# Patient Record
Sex: Female | Born: 1950 | Race: Black or African American | Hispanic: No | Marital: Married | Smoking: Never smoker
Health system: Southern US, Community
[De-identification: ages and names within clinical notes are randomized; demographics above are authoritative.]

## PROBLEM LIST (undated history)

## (undated) ENCOUNTER — Emergency Department (HOSPITAL_COMMUNITY)

## (undated) DIAGNOSIS — M1990 Unspecified osteoarthritis, unspecified site: Secondary | ICD-10-CM

## (undated) DIAGNOSIS — I4891 Unspecified atrial fibrillation: Secondary | ICD-10-CM

## (undated) DIAGNOSIS — E785 Hyperlipidemia, unspecified: Secondary | ICD-10-CM

## (undated) DIAGNOSIS — I10 Essential (primary) hypertension: Secondary | ICD-10-CM

## (undated) DIAGNOSIS — D638 Anemia in other chronic diseases classified elsewhere: Secondary | ICD-10-CM

## (undated) HISTORY — DX: Unspecified osteoarthritis, unspecified site: M19.90

## (undated) HISTORY — PX: CHOLECYSTECTOMY: SHX55

## (undated) HISTORY — DX: Hyperlipidemia, unspecified: E78.5

## (undated) HISTORY — DX: Essential (primary) hypertension: I10

## (undated) HISTORY — DX: Anemia in other chronic diseases classified elsewhere: D63.8

## (undated) HISTORY — DX: Morbid (severe) obesity due to excess calories: E66.01

## (undated) HISTORY — DX: Unspecified atrial fibrillation: I48.91

## (undated) HISTORY — PX: SHOULDER SURGERY: SHX246

## (undated) HISTORY — PX: REPLACEMENT TOTAL KNEE BILATERAL: SUR1225

## (undated) HISTORY — PX: TUBAL LIGATION: SHX77

---

## 1998-10-06 ENCOUNTER — Other Ambulatory Visit: Admission: RE | Admit: 1998-10-06 | Discharge: 1998-10-06 | Payer: Self-pay | Admitting: Family Medicine

## 2000-10-13 ENCOUNTER — Ambulatory Visit (HOSPITAL_COMMUNITY): Admission: RE | Admit: 2000-10-13 | Discharge: 2000-10-13 | Payer: Self-pay | Admitting: Family Medicine

## 2000-10-13 ENCOUNTER — Encounter: Payer: Self-pay | Admitting: Family Medicine

## 2001-01-08 ENCOUNTER — Emergency Department (HOSPITAL_COMMUNITY): Admission: EM | Admit: 2001-01-08 | Discharge: 2001-01-08 | Payer: Self-pay | Admitting: Emergency Medicine

## 2001-01-08 ENCOUNTER — Encounter: Payer: Self-pay | Admitting: Emergency Medicine

## 2003-03-03 ENCOUNTER — Ambulatory Visit (HOSPITAL_COMMUNITY): Admission: RE | Admit: 2003-03-03 | Discharge: 2003-03-03 | Payer: Self-pay | Admitting: Family Medicine

## 2004-02-24 ENCOUNTER — Ambulatory Visit (HOSPITAL_COMMUNITY): Admission: RE | Admit: 2004-02-24 | Discharge: 2004-02-24 | Payer: Self-pay | Admitting: Gastroenterology

## 2004-06-23 ENCOUNTER — Encounter (HOSPITAL_COMMUNITY): Admission: RE | Admit: 2004-06-23 | Discharge: 2004-09-21 | Payer: Self-pay | Admitting: Endocrinology

## 2004-09-20 ENCOUNTER — Ambulatory Visit: Payer: Self-pay | Admitting: Oncology

## 2004-11-07 ENCOUNTER — Ambulatory Visit: Payer: Self-pay | Admitting: Oncology

## 2004-12-07 ENCOUNTER — Encounter (INDEPENDENT_AMBULATORY_CARE_PROVIDER_SITE_OTHER): Payer: Self-pay | Admitting: *Deleted

## 2004-12-07 ENCOUNTER — Ambulatory Visit (HOSPITAL_COMMUNITY): Admission: RE | Admit: 2004-12-07 | Discharge: 2004-12-07 | Payer: Self-pay | Admitting: Gastroenterology

## 2005-01-30 ENCOUNTER — Ambulatory Visit: Payer: Self-pay | Admitting: Oncology

## 2005-04-21 ENCOUNTER — Ambulatory Visit: Payer: Self-pay | Admitting: Oncology

## 2005-06-15 ENCOUNTER — Ambulatory Visit: Payer: Self-pay | Admitting: Oncology

## 2005-08-11 ENCOUNTER — Ambulatory Visit: Payer: Self-pay | Admitting: Oncology

## 2005-09-22 ENCOUNTER — Ambulatory Visit: Payer: Self-pay | Admitting: Oncology

## 2005-09-26 LAB — CBC WITH DIFFERENTIAL/PLATELET
BASO%: 0.7 % (ref 0.0–2.0)
Basophils Absolute: 0 10*3/uL (ref 0.0–0.1)
HCT: 38.6 % (ref 34.8–46.6)
HGB: 12.5 g/dL (ref 11.6–15.9)
LYMPH%: 36.9 % (ref 14.0–48.0)
MCH: 27.6 pg (ref 26.0–34.0)
MCHC: 32.3 g/dL (ref 32.0–36.0)
MONO#: 0.4 10*3/uL (ref 0.1–0.9)
NEUT%: 53.4 % (ref 39.6–76.8)
Platelets: 307 10*3/uL (ref 145–400)
WBC: 5.7 10*3/uL (ref 3.9–10.0)
lymph#: 2.1 10*3/uL (ref 0.9–3.3)

## 2005-10-16 LAB — BASIC METABOLIC PANEL
CO2: 24 mEq/L (ref 19–32)
Calcium: 9.4 mg/dL (ref 8.4–10.5)
Glucose, Bld: 85 mg/dL (ref 70–99)
Potassium: 4.4 mEq/L (ref 3.5–5.3)
Sodium: 142 mEq/L (ref 135–145)

## 2005-10-16 LAB — CBC WITH DIFFERENTIAL (CANCER CENTER ONLY)
BASO#: 0 10*3/uL (ref 0.0–0.2)
Eosinophils Absolute: 0.1 10*3/uL (ref 0.0–0.5)
HCT: 39 % (ref 34.8–46.6)
LYMPH%: 33.4 % (ref 14.0–48.0)
MCV: 83 fL (ref 81–101)
MONO#: 0.3 10*3/uL (ref 0.1–0.9)
NEUT%: 59.1 % (ref 39.6–80.0)
RBC: 4.72 10*6/uL (ref 3.70–5.32)
WBC: 5.9 10*3/uL (ref 3.9–10.0)

## 2005-11-10 ENCOUNTER — Ambulatory Visit: Payer: Self-pay | Admitting: Oncology

## 2005-11-13 LAB — CBC WITH DIFFERENTIAL (CANCER CENTER ONLY)
BASO#: 0 10*3/uL (ref 0.0–0.2)
Eosinophils Absolute: 0.1 10*3/uL (ref 0.0–0.5)
HGB: 11.6 g/dL (ref 11.6–15.9)
MCV: 85 fL (ref 81–101)
MONO#: 0.3 10*3/uL (ref 0.1–0.9)
NEUT#: 4.7 10*3/uL (ref 1.5–6.5)
Platelets: 263 10*3/uL (ref 145–400)
RBC: 4.26 10*6/uL (ref 3.70–5.32)
WBC: 6.8 10*3/uL (ref 3.9–10.0)

## 2005-12-11 LAB — CBC WITH DIFFERENTIAL (CANCER CENTER ONLY)
BASO#: 0 10*3/uL (ref 0.0–0.2)
BASO%: 0.4 % (ref 0.0–2.0)
EOS%: 2.7 % (ref 0.0–7.0)
HGB: 12.6 g/dL (ref 11.6–15.9)
LYMPH#: 2.2 10*3/uL (ref 0.9–3.3)
MCH: 26.4 pg (ref 26.0–34.0)
MCHC: 32.5 g/dL (ref 32.0–36.0)
MONO%: 3.7 % (ref 0.0–13.0)
NEUT#: 3.3 10*3/uL (ref 1.5–6.5)
Platelets: 323 10*3/uL (ref 145–400)
RDW: 15.4 % — ABNORMAL HIGH (ref 10.5–14.6)

## 2005-12-11 LAB — BASIC METABOLIC PANEL
BUN: 40 mg/dL — ABNORMAL HIGH (ref 6–23)
CO2: 22 mEq/L (ref 19–32)
Chloride: 109 mEq/L (ref 96–112)
Creatinine, Ser: 1.66 mg/dL — ABNORMAL HIGH (ref 0.40–1.20)
Glucose, Bld: 164 mg/dL — ABNORMAL HIGH (ref 70–99)

## 2005-12-11 LAB — FERRITIN: Ferritin: 228 ng/mL (ref 10–291)

## 2006-01-04 ENCOUNTER — Ambulatory Visit: Payer: Self-pay | Admitting: Oncology

## 2006-01-09 LAB — CBC WITH DIFFERENTIAL (CANCER CENTER ONLY)
EOS%: 3.4 % (ref 0.0–7.0)
Eosinophils Absolute: 0.3 10*3/uL (ref 0.0–0.5)
LYMPH#: 2.3 10*3/uL (ref 0.9–3.3)
MCH: 26.3 pg (ref 26.0–34.0)
MONO%: 4.7 % (ref 0.0–13.0)
NEUT#: 4.8 10*3/uL (ref 1.5–6.5)
Platelets: 270 10*3/uL (ref 145–400)
RBC: 4.63 10*6/uL (ref 3.70–5.32)

## 2006-02-05 LAB — CBC WITH DIFFERENTIAL (CANCER CENTER ONLY)
BASO#: 0.1 10*3/uL (ref 0.0–0.2)
BASO%: 1.1 % (ref 0.0–2.0)
EOS%: 2.8 % (ref 0.0–7.0)
Eosinophils Absolute: 0.2 10*3/uL (ref 0.0–0.5)
HCT: 34.2 % — ABNORMAL LOW (ref 34.8–46.6)
HGB: 11.1 g/dL — ABNORMAL LOW (ref 11.6–15.9)
LYMPH#: 2.5 10*3/uL (ref 0.9–3.3)
LYMPH%: 33.9 % (ref 14.0–48.0)
MCH: 26.9 pg (ref 26.0–34.0)
MCHC: 32.6 g/dL (ref 32.0–36.0)
MCV: 83 fL (ref 81–101)
MONO#: 0.3 10*3/uL (ref 0.1–0.9)
MONO%: 4.4 % (ref 0.0–13.0)
NEUT#: 4.3 10*3/uL (ref 1.5–6.5)
NEUT%: 57.8 % (ref 39.6–80.0)
Platelets: 242 10*3/uL (ref 145–400)
RBC: 4.14 10*6/uL (ref 3.70–5.32)
RDW: 15.6 % — ABNORMAL HIGH (ref 10.5–14.6)
WBC: 7.4 10*3/uL (ref 3.9–10.0)

## 2006-03-01 ENCOUNTER — Ambulatory Visit: Payer: Self-pay | Admitting: Oncology

## 2006-04-02 ENCOUNTER — Encounter: Admission: RE | Admit: 2006-04-02 | Discharge: 2006-04-02 | Payer: Self-pay | Admitting: Endocrinology

## 2006-10-17 ENCOUNTER — Inpatient Hospital Stay (HOSPITAL_COMMUNITY): Admission: EM | Admit: 2006-10-17 | Discharge: 2006-10-22 | Payer: Self-pay | Admitting: Emergency Medicine

## 2007-03-13 ENCOUNTER — Encounter: Admission: RE | Admit: 2007-03-13 | Discharge: 2007-03-13 | Payer: Self-pay | Admitting: Endocrinology

## 2008-02-03 ENCOUNTER — Encounter: Admission: RE | Admit: 2008-02-03 | Discharge: 2008-02-03 | Payer: Self-pay | Admitting: Endocrinology

## 2009-02-11 ENCOUNTER — Ambulatory Visit: Payer: Self-pay | Admitting: Surgery

## 2009-03-11 ENCOUNTER — Encounter: Admission: RE | Admit: 2009-03-11 | Discharge: 2009-03-11 | Payer: Self-pay | Admitting: Internal Medicine

## 2009-12-16 ENCOUNTER — Ambulatory Visit: Payer: Self-pay | Admitting: Cardiology

## 2009-12-30 ENCOUNTER — Ambulatory Visit: Payer: Self-pay | Admitting: Cardiovascular Disease

## 2010-01-27 ENCOUNTER — Ambulatory Visit: Payer: Self-pay | Admitting: Cardiology

## 2010-02-01 ENCOUNTER — Ambulatory Visit: Payer: Self-pay | Admitting: Cardiovascular Disease

## 2010-02-08 ENCOUNTER — Ambulatory Visit: Payer: Self-pay | Admitting: Cardiovascular Disease

## 2010-02-14 ENCOUNTER — Ambulatory Visit: Payer: Self-pay | Admitting: Cardiovascular Disease

## 2010-03-08 ENCOUNTER — Ambulatory Visit: Payer: Self-pay | Admitting: Cardiology

## 2010-04-05 ENCOUNTER — Ambulatory Visit: Payer: Self-pay | Admitting: Cardiology

## 2010-04-13 ENCOUNTER — Ambulatory Visit: Payer: Self-pay | Admitting: Cardiology

## 2010-05-18 ENCOUNTER — Ambulatory Visit: Payer: Self-pay | Admitting: Cardiovascular Disease

## 2010-05-23 LAB — BASIC METABOLIC PANEL
BUN: 21 mg/dL (ref 6–23)
CO2: 30 mEq/L (ref 19–32)
Calcium: 9.8 mg/dL (ref 8.4–10.5)
Chloride: 101 mEq/L (ref 96–112)
Creatinine, Ser: 1.16 mg/dL (ref 0.4–1.2)
GFR calc Af Amer: 58 mL/min — ABNORMAL LOW (ref >60)
GFR calc non Af Amer: 48 mL/min — ABNORMAL LOW (ref >60)
Glucose, Bld: 100 mg/dL — ABNORMAL HIGH (ref 70–99)
Potassium: 3.8 mEq/L (ref 3.5–5.1)
Sodium: 139 mEq/L (ref 135–145)

## 2010-05-23 LAB — CBC
HCT: 33.5 % — ABNORMAL LOW (ref 36.0–46.0)
Hemoglobin: 10.9 g/dL — ABNORMAL LOW (ref 12.0–15.0)
MCH: 28 pg (ref 26.0–34.0)
MCHC: 32.5 g/dL (ref 30.0–36.0)
MCV: 86.1 fL (ref 78.0–100.0)
Platelets: 279 10*3/uL (ref 150–400)
RBC: 3.89 MIL/uL (ref 3.87–5.11)
RDW: 15.3 % (ref 11.5–15.5)
WBC: 6.8 10*3/uL (ref 4.0–10.5)

## 2010-05-23 LAB — APTT: aPTT: 37 seconds (ref 24–37)

## 2010-05-23 LAB — PROTIME-INR
INR: 1.65 — ABNORMAL HIGH (ref 0.00–1.49)
Prothrombin Time: 19.7 seconds — ABNORMAL HIGH (ref 11.6–15.2)

## 2010-05-25 ENCOUNTER — Ambulatory Visit (HOSPITAL_COMMUNITY)
Admission: RE | Admit: 2010-05-25 | Discharge: 2010-05-25 | Payer: Self-pay | Source: Home / Self Care | Attending: Obstetrics | Admitting: Obstetrics

## 2010-05-30 LAB — GLUCOSE, CAPILLARY
Glucose-Capillary: 79 mg/dL (ref 70–99)
Glucose-Capillary: 65 mg/dL — ABNORMAL LOW (ref 70–99)
Glucose-Capillary: 90 mg/dL (ref 70–99)

## 2010-06-06 ENCOUNTER — Ambulatory Visit: Payer: Self-pay | Admitting: Cardiovascular Disease

## 2010-06-06 ENCOUNTER — Emergency Department (HOSPITAL_COMMUNITY)
Admission: EM | Admit: 2010-06-06 | Discharge: 2010-06-06 | Payer: Self-pay | Source: Home / Self Care | Admitting: Family Medicine

## 2010-06-13 NOTE — Op Note (Signed)
NAMETEREZ, MONTEE                 ACCOUNT NO.:  [id number]  MEDICAL RECORD NO.:  [id number]          PATIENT TYPE:  AMB  LOCATION:  SDC                           FACILITY:  [facility]  PHYSICIAN:  Lendon Colonel, MD   DATE OF BIRTH:  1950-12-18  DATE OF PROCEDURE:  05/25/2010 DATE OF DISCHARGE:                              OPERATIVE REPORT   PREOPERATIVE DIAGNOSES: 1. Postmenopausal bleeding. 2. Cervical mass. 3. Probable fibroid.  POSTOPERATIVE DIAGNOSES: 1. Postmenopausal bleeding. 2. Cervical mass. 3. Probable fibroid.  PROCEDURES:  Endometrial biopsy, cervical myomectomy.  SURGEON:  Alphonsus Sias. Ernestina Penna, MD  ASSISTANT:  None.  ANESTHESIA:  Local with MAC.  FINDINGS:  A 6-cm uterine sound 3 x 2 cm soft mass on the posterior lip of the cervix.  SPECIMENS:  Endometrial biopsy, cervical mass to Pathology.  ANTIBIOTICS:  None.  ESTIMATED BLOOD LOSS:  Minimal.  COMPLICATIONS:  None.  INDICATIONS:  This is a 60 year old postmenopausal woman who had no GYN care in 20 years until she began monthly well time bleeding since August of 2011.  The patient noted bleeding that lasted a few days each month without pain.  Of note, patient is on Coumadin for atrial fibrillation. The patient was referred and had a normal Pap smear, a normal pelvic ultrasound with a 6-cm, 35 mL uterus with a 2.6-mm endometrial stripe and a 3 x 2 cm mass on the posterior lip of the cervix that appeared most consistent with a cervical fibroid given the thin endometrial stripe.  No preoperative endometrial biopsy was done. Given the normal Pap smear no preprocedure colposcopy was done as the mass appeared benign in nature.  PROCEDURE IN DETAIL:  After informed consent was obtained, the patient was taken to the operating room where MAC anesthesia was initiated without difficulty.  She was prepped and draped in normal sterile fashion in dorsal supine lithotomy position.  A straight cath was done for small  amount of concentrated urine.  Bimanual examination was done to assess the size and position of the uterus.  A speculum  was placed into the vagina and a single-tooth tenaculum used to grasp the anterior lip of the cervix.  A sound was easily passed into the uterus to 6 cm.  Endometrial biopsy was performed and sent to Pathology. Tenaculum and Jacobson tenaculum were placed on the cervical mass.  An attempt was made to dissect the cervical mass away from the surrounding cervical tissue.  It was quite stuck and on exam today it appeared much softer with some cystic appearance as opposed to the firm fibroid- appearing tissue in the office.  A 10 mL of the solution of 20 mL of vasopressin, 100 mL normal saline was injected at the base of the mass. Additional 10 mL of 1% lidocaine was injected at the base of the mass as well.  A large loop was used to remove the cervical mass.  A second deep pass was taken.  All of the mass was removed.  Ball cautery was used to provide hemostasis at the large base of the posterior cervical incision and Monsel's was placed.  Hemostasis was assurred. Vaginal epithelium was intact and the procedure was terminated.  Patient tolerated this procedure well. Sponge, lap, and needle count were correct x3 and patient was taken to recovery room in stable condition.     Lendon Colonel, MD     KAF/MEDQ  D:  05/25/2010  T:  05/25/2010  Job:  [id number]  Electronically Signed by Noland Fordyce MD on 06/13/2010 09:47:27 PM

## 2010-06-16 ENCOUNTER — Other Ambulatory Visit (INDEPENDENT_AMBULATORY_CARE_PROVIDER_SITE_OTHER): Payer: Medicare Other

## 2010-06-16 DIAGNOSIS — Z7901 Long term (current) use of anticoagulants: Secondary | ICD-10-CM

## 2010-06-16 DIAGNOSIS — I4891 Unspecified atrial fibrillation: Secondary | ICD-10-CM

## 2010-06-30 ENCOUNTER — Encounter (INDEPENDENT_AMBULATORY_CARE_PROVIDER_SITE_OTHER): Payer: Medicare Other

## 2010-06-30 DIAGNOSIS — I4891 Unspecified atrial fibrillation: Secondary | ICD-10-CM

## 2010-06-30 DIAGNOSIS — Z7901 Long term (current) use of anticoagulants: Secondary | ICD-10-CM

## 2010-07-18 ENCOUNTER — Other Ambulatory Visit (INDEPENDENT_AMBULATORY_CARE_PROVIDER_SITE_OTHER): Payer: Medicare Other

## 2010-07-18 DIAGNOSIS — Z7901 Long term (current) use of anticoagulants: Secondary | ICD-10-CM

## 2010-07-18 DIAGNOSIS — I4891 Unspecified atrial fibrillation: Secondary | ICD-10-CM

## 2010-07-27 ENCOUNTER — Ambulatory Visit (INDEPENDENT_AMBULATORY_CARE_PROVIDER_SITE_OTHER): Payer: Medicare Other | Admitting: *Deleted

## 2010-07-27 DIAGNOSIS — Z7901 Long term (current) use of anticoagulants: Secondary | ICD-10-CM

## 2010-07-27 LAB — POCT INR: INR: 2.7

## 2010-08-04 ENCOUNTER — Ambulatory Visit (INDEPENDENT_AMBULATORY_CARE_PROVIDER_SITE_OTHER): Payer: Medicare Other | Admitting: *Deleted

## 2010-08-04 DIAGNOSIS — I4891 Unspecified atrial fibrillation: Secondary | ICD-10-CM

## 2010-08-04 DIAGNOSIS — Z7901 Long term (current) use of anticoagulants: Secondary | ICD-10-CM

## 2010-08-04 LAB — POCT INR: INR: 3.4

## 2010-08-18 ENCOUNTER — Ambulatory Visit (INDEPENDENT_AMBULATORY_CARE_PROVIDER_SITE_OTHER): Payer: Medicare Other | Admitting: *Deleted

## 2010-08-18 DIAGNOSIS — Z7901 Long term (current) use of anticoagulants: Secondary | ICD-10-CM

## 2010-08-18 DIAGNOSIS — I4891 Unspecified atrial fibrillation: Secondary | ICD-10-CM

## 2010-08-25 ENCOUNTER — Encounter: Payer: Self-pay | Admitting: Cardiovascular Disease

## 2010-08-25 DIAGNOSIS — IMO0001 Reserved for inherently not codable concepts without codable children: Secondary | ICD-10-CM | POA: Insufficient documentation

## 2010-08-25 DIAGNOSIS — I10 Essential (primary) hypertension: Secondary | ICD-10-CM | POA: Insufficient documentation

## 2010-08-25 DIAGNOSIS — E1129 Type 2 diabetes mellitus with other diabetic kidney complication: Secondary | ICD-10-CM | POA: Insufficient documentation

## 2010-08-25 DIAGNOSIS — E785 Hyperlipidemia, unspecified: Secondary | ICD-10-CM | POA: Insufficient documentation

## 2010-08-25 DIAGNOSIS — M1990 Unspecified osteoarthritis, unspecified site: Secondary | ICD-10-CM | POA: Insufficient documentation

## 2010-08-29 ENCOUNTER — Ambulatory Visit (INDEPENDENT_AMBULATORY_CARE_PROVIDER_SITE_OTHER): Payer: Medicare Other | Admitting: Cardiovascular Disease

## 2010-08-29 ENCOUNTER — Encounter: Payer: Self-pay | Admitting: Cardiovascular Disease

## 2010-08-29 VITALS — BP 136/76 | HR 84 | Ht 62.0 in | Wt 246.6 lb

## 2010-08-29 DIAGNOSIS — I4891 Unspecified atrial fibrillation: Secondary | ICD-10-CM

## 2010-08-29 NOTE — Assessment & Plan Note (Signed)
Megan Fischer remains in normal sinus rhythm. I've encouraged her to continue with her same medications. Her heart levels have been therapeutic. We will see her again in one year for office visit and EKG.

## 2010-08-29 NOTE — Progress Notes (Signed)
Megan Fischer Date of Birth  [birth year] [facility] [address]     [address] [place], [place]  [address] [phone]  Fax  [phone]  History of Present Illness:  Megan Fischer is a middle-aged female with a history of obesity, intermittent atrial fibrillation, hypertension, dyslipidemia, and diabetes. She presents today for followup visit. She does not have any complaints.  Current Outpatient Prescriptions on File Prior to Visit  Medication Sig Dispense Refill  . BENAZEPRIL HCL PO Take by mouth daily.        Marland Kitchen CALCIUM PO Take by mouth 2 (two) times daily.        Marland Kitchen diltiazem (CARDIZEM CD) 240 MG 24 hr capsule Take 240 mg by mouth daily.        . ergocalciferol (VITAMIN D2) 50000 UNITS capsule Take 50,000 Units by mouth once a week.        Marland Kitchen exenatide (BYETTA 10 MCG PEN) 10 MCG/0.04ML SOLN Inject into the skin 2 (two) times daily with a meal.        . glipiZIDE (GLUCOTROL) 5 MG tablet Take 5 mg by mouth daily.        . metFORMIN (GLUCOPHAGE) 500 MG tablet Take 500 mg by mouth 3 (three) times daily.        . metoprolol (TOPROL-XL) 50 MG 24 hr tablet Take 50 mg by mouth daily.        . rosuvastatin (CRESTOR) 10 MG tablet Take 10 mg by mouth daily.        . Tamsulosin HCl (FLOMAX) 0.4 MG CAPS Take by mouth daily.        Marland Kitchen warfarin (COUMADIN) 5 MG tablet Take 5 mg by mouth daily. AS DIRECTED          Allergies  Allergen Reactions  . Codeine   . Oxycontin   . Penicillins     Past Medical History  Diagnosis Date  . Obesities, morbid   . Atrial fibrillation   . Hypertension   . Dyslipidemia   . Diabetes mellitus   . Osteoarthritis     Past Surgical History  Procedure Date  . Cholecystectomy   . Tubal ligation   . Replacement total knee bilateral   . Shoulder surgery     RIGHT SHOULDER    History  Smoking status  . Never Smoker   Smokeless tobacco  . Not on file    History  Alcohol Use No    Family History  Problem  Relation Age of Onset  . Heart attack Mother   . Stroke Mother   . Diabetes Mother   . Hypertension Brother     Reviw of Systems:  Reviewed in the HPI.  All other systems are negative.  Physical Exam: BP 136/76  Pulse 84  Ht 5\' 2"  (1.575 m)  Wt 246 lb 9.6 oz (111.857 kg)  BMI 45.10 kg/m2 The patient is alert and oriented x 3.  The mood and affect are normal.  The skin is warm and dry.  Color is normal.  The HEENT exam reveals that the sclera are nonicteric.  The mucous membranes are moist.  The carotids are 2+ without bruits.  There is no thyromegaly.  There is no JVD.  The lungs are clear.  The chest wall is non tender.  The heart exam reveals a regular rate with a normal S1 and S2.  She has frequent premature beats.  There are no murmurs, gallops, or rubs.  The PMI is not displaced.   Abdominal exam reveals good bowel sounds.  There is no guarding or rebound.  There is no hepatosplenomegaly or tenderness.  There are no masses.  Exam of the legs reveal no clubbing, cyanosis, or edema.  The legs are without rashes.  The distal pulses are intact.  Cranial nerves II - XII are intact.  Motor and sensory functions are intact.  The gait is normal.  ECG: Normal sinus rhythm with frequent premature atrial contractions. She has nonspecific T-wave abnormality. Assessment / Plan:

## 2010-09-09 ENCOUNTER — Ambulatory Visit (INDEPENDENT_AMBULATORY_CARE_PROVIDER_SITE_OTHER): Payer: Medicare Other | Admitting: *Deleted

## 2010-09-09 DIAGNOSIS — I4891 Unspecified atrial fibrillation: Secondary | ICD-10-CM

## 2010-09-09 DIAGNOSIS — Z7901 Long term (current) use of anticoagulants: Secondary | ICD-10-CM

## 2010-09-09 LAB — POCT INR: INR: 3

## 2010-09-20 NOTE — Discharge Summary (Signed)
Megan Fischer, Megan Fischer                 ACCOUNT NO.:  [id number]   MEDICAL RECORD NO.:  [id number]          PATIENT TYPE:  INP   LOCATION:  1304                         FACILITY:  [facility]   PHYSICIAN:  Feliberto Gottron. Turner Daniels, M.D.   DATE OF BIRTH:  May 30, 1950   DATE OF ADMISSION:  10/17/2006  DATE OF DISCHARGE:  10/22/2006                               DISCHARGE SUMMARY   PRIMARY DIAGNOSIS:  Bimalleolar fracture with interruption of ankle  mortis joint  extensive to intramedullary rod and surrounding cement  from previous oncological fracture.   HISTORY OF PRESENT ILLNESS:  The patient is a 60 year old, white female  who fell on October 17, 2006, and suffered a bimalleolar fracture, vertex  square with a worsening left total knee humeral prosthetic stem that  fracture with subsequent surgery.  The patient had Giant Cell tumor of L  [place] followed by Dr. Romeo Apple  surgery referred for further  consultation.   SOCIAL HISTORY:  She lives with husband .  She is disabled.  She does  not drink alcohol.   ALLERGIES:  SHE IS ALLERGIC TO CODEINE AND OXYCODONE.   CURRENT MEDICATIONS:  The patient reported Crestor, Glipizide, insulin  and Toprol.   PAST MEDICAL HISTORY:  Significant for tumor of the left leg treated by  Dr. Romeo Apple , hypertension, diabetes and pulmonary embolus in 1970.   PHYSICAL EXAMINATION:  VITAL SIGNS:  Pulse 88, temperature 98.5,  respirations 16 , blood pressure 150/90.  GENERAL:  wnwd  female.  HEENT:  Head is atraumatic, normocephalic.  NECK:  Supple with full range of motion.  CHEST:  Clear to auscultation and percussion.  HEART:  Regular rate and rhythm.  ABDOMEN:  Soft and nontender.  EXTREMITIES:  Showed no break in the skin.  neuro  Vascular intact.  Moderate pain.   LABORATORY DATA:  Admitting labs showed hemoglobin of 11.2, WBC of 11.5  and __________  3.5.   HOSPITAL COURSE:  The patient was admitted for pain control and further  consultation for treatment of  fracture as well as weaning off of  Coumadin so that she would be a safe surgical candidate.  Hospital day  one, the patient was complaining of moderate pain.  Posterior splint in  place.  She continued to have an INR greater than 3.2.  Hospital day  two, the patient was without complaint.  INR was 2.8.  She was afebrile.  __________ physical therapy __________ intact.  I had a discussion with  Dr. Doran Durand __________ and Dr. Turner Daniels __________  she will be transferred  to __________ hospital for further care there due to the possible  complications and involvement of the previous tumor related prosthesis,  while trying to repair the fractured ankle.  She was continued to be  monitored  while she was also on Coumadin.  Because of her significant __________  medical history.  Hospital day three, the patient continued to rest  quietly and was afebrile.  INR 1.8   Dictation ended at this point.      Laural Benes. Su Hilt, P.A.  Feliberto Gottron. Turner Daniels, M.D.  Electronically Signed    JBR/MEDQ  D:  10/22/2006  T:  10/22/2006  Job:  [id number]

## 2010-09-20 NOTE — Discharge Summary (Signed)
[name], [name]                 ACCOUNT NO.:  [id number]   MEDICAL RECORD NO.:  [id number]          PATIENT TYPE:  INP   LOCATION:  1304                         FACILITY:  [facility]   PHYSICIAN:  Laural Benes. Su Hilt, P.A.  DATE OF BIRTH:  02-04-1951   DATE OF ADMISSION:  10/17/2006  DATE OF DISCHARGE:                               DISCHARGE SUMMARY   ADDENDUM TO #[id number]:   On hospital day three her INR was 1.5.  On hospital day number four the  patient was doing well with the exception of having an increased  temperature.  She stated her __________ 98.6.  The patient was  complaining of a lot of pain.  T-max was 102 but had resolved to 99.4.  The patient had no complaints of shortness of breath or chest pain.  Hemoglobin of 9.1, WBC 12.4, INR of 1.1, PT of 14.5.  She was  neurovascularly intact in both lower extremities.  Extremities were  stable orthopedically and will be transferred to [facility] when bed is  available.  Admitting physician should be Dr. Sander Radon, as he is  the doctor on call for Orthopedics today.  She should begin Lovenox.  Her INR is now down to the low 2.  Per pharmacy protocol parameters for  DVT prophylaxis after total hip.  We will try Vicodin for pain and she  states she has no history of being nauseous as she is with Oxycodone.   At the time of her discharge she was medically stable and orthopedically  stable but was still awaiting definitive treatment for her fracture.  She should have x-rays, placed on CD to go with her to the hospital at  [facility].  We wish her well for a total recovery.   DISCHARGE MEDICATIONS:  Dilaudid 0.3 mg, __________ 5 mg IV given q. 4  hours p.r.n. pain, Lipitor 20 mg p.o. q.h.s., Glucotrol 5 mg p.o. daily  __________, Toprol XL 50 mg p.o. daily, Tenex 10 mg p.o. q.h.s., Lyrica  __________ mg p.o. b.i.d., Lotensin 20 mg p.o. daily,  hydrochlorothiazide 20 mg p.o. daily, Glucophage 500 mg p.o. t.i.d. with  meals, insulin sliding scale,  Tylenol as needed for temperature greater  than 101, Phenergan 12.5 mg p.o. or IV for nausea, Vicodin for pain 1 to  2 q. 4 hours as needed for pain, Compazine as needed for nausea,  Benadryl as needed for itching.   ACTIVITY:  Bedrest with leg elevated.  She may sit in a chair with  assistance for transfer from bed to chair.   DIET:  CHO modified diet for ADA and __________.      Laural Benes. Jannet Mantis.     JBR/MEDQ  D:  10/22/2006  T:  10/22/2006  Job:  [id number]

## 2010-09-20 NOTE — Procedures (Signed)
CAROTID DUPLEX EXAM   INDICATION:  Right bruit.   HISTORY:  Diabetes:  Yes  Cardiac:  No  Hypertension:  Yes  Smoking:  No  Previous Surgery:  No  CV History:  No  Amaurosis Fugax No, Paresthesias No, Hemiparesis No                                       RIGHT             LEFT  Brachial systolic pressure:         125               118  Brachial Doppler waveforms:         Triphasic         Triphasic  Vertebral direction of flow:        Antegrade         Antegrade  DUPLEX VELOCITIES (cm/sec)  CCA peak systolic                   0.76              0.70  ECA peak systolic                   1.17              0.67  ICA peak systolic                   0.82              0.58  ICA end diastolic                   0.20              0.21  PLAQUE MORPHOLOGY:                  Smooth            Smooth  PLAQUE AMOUNT:                      Mild              Mild  PLAQUE LOCATION:                    ICA               ICA   IMPRESSION:  Stenosis 0-29% noted bilaterally.           ___________________________________________  V. Charlena Cross, MD   CJ/MEDQ  D:  02/11/2009  T:  02/11/2009  Job:  [id number]

## 2010-09-23 ENCOUNTER — Ambulatory Visit (INDEPENDENT_AMBULATORY_CARE_PROVIDER_SITE_OTHER): Payer: Medicare Other | Admitting: *Deleted

## 2010-09-23 DIAGNOSIS — Z7901 Long term (current) use of anticoagulants: Secondary | ICD-10-CM

## 2010-09-23 DIAGNOSIS — I4891 Unspecified atrial fibrillation: Secondary | ICD-10-CM

## 2010-09-23 LAB — POCT INR: INR: 1.7

## 2010-09-23 NOTE — Op Note (Signed)
NAME:  Megan Fischer, Megan Fischer NO.:  [id number]   MEDICAL RECORD NO.:  [id number]          PATIENT TYPE:  AMB   LOCATION:  ENDO                         FACILITY:  [facility]   PHYSICIAN:  Graylin Shiver, M.D.   DATE OF BIRTH:  06-Jun-1950   DATE OF PROCEDURE:  12/07/2004  DATE OF DISCHARGE:                                 OPERATIVE REPORT   Esophagogastroduodenoscopy with biopsy.   INDICATIONS FOR PROCEDURE:  Anemia, rule out upper GI lesion.   Informed consent was obtained after explanation of the risks of bleeding,  infection and perforation.   PREMEDICATION:  Fentanyl 70 mcg IV, Versed 8 milligrams IV.   PROCEDURE:  With the patient in the left lateral decubitus position, the  Olympus gastroscope was inserted into the oropharynx and passed into the  esophagus. It was advanced down the esophagus then into the stomach and into  the duodenum. The second portion and bulb of the duodenum were normal. The  stomach showed a diffuse erythematous appearance to the mucosa compatible  with gastritis.  In the antrum, there was a superficial ulceration  surrounded by more erythematous mucosa. This area was biopsied.  The  esophagus looked normal. No lesions were seen in the fundus or cardia on  retroflexion. She tolerated the procedure well without complications.   IMPRESSION:  1.  Gastritis.  2.  Small superficial ulceration in the antrum.  A biopsy was obtained for      histological inspection.       SFG/MEDQ  D:  12/07/2004  T:  12/07/2004  Job:  [id number]   cc:   Drue Second, MD  Fax: [phone]

## 2010-09-23 NOTE — Op Note (Signed)
NAME:  Megan Fischer, Megan Fischer                 ACCOUNT NO.:  [id number]   MEDICAL RECORD NO.:  [id number]          PATIENT TYPE:  AMB   LOCATION:  ENDO                         FACILITY:  [facility]   PHYSICIAN:  Graylin Shiver, M.D.   DATE OF BIRTH:  1950-06-23   DATE OF PROCEDURE:  02/24/2004  DATE OF DISCHARGE:                                 OPERATIVE REPORT   PROCEDURE:  Incomplete colonoscopy.   INDICATIONS FOR PROCEDURE:  Heme-positive stool.   CONSENT:  Informed consent was obtained after explanation of the risks of  bleeding, infection, and perforation.   PREMEDICATION:  Fentanyl 125 mcg IV, Versed 10 mg IV.   DESCRIPTION OF PROCEDURE:  With the patient in the left lateral decubitus  position, a rectal exam was performed.  No masses were felt.  The Olympus  colonoscope was inserted into the rectum and advanced around the colon to  the region of the hepatic flexure.  Despite all maneuvers, I could not  advance the scope beyond this point.  I placed the patient on her back,  placed the patient on her right side, applied pressure to various points in  her abdomen, but the scope would not move beyond this point.  The scope was  brought out, visualizing the mucosa.  The transverse colon looked normal.  The descending colon and sigmoid revealed a few scattered diverticula.  The  rectum looked normal.  She tolerated the procedure well without  complications.   IMPRESSION:  Diverticulosis of the left colon.   PLAN:  I will order an air contrast barium enema to evaluate the more  proximal colon.      SFG/MEDQ  D:  02/24/2004  T:  02/24/2004  Job:  [id number]   cc:   Reather Littler, M.D.  [address]  [place]  [place] [address]  Fax: [phone]

## 2010-09-28 ENCOUNTER — Encounter: Payer: Medicare Other | Admitting: Oncology

## 2010-09-29 ENCOUNTER — Encounter (HOSPITAL_BASED_OUTPATIENT_CLINIC_OR_DEPARTMENT_OTHER): Payer: Medicare Other | Admitting: Oncology

## 2010-09-29 ENCOUNTER — Other Ambulatory Visit: Payer: Self-pay | Admitting: Oncology

## 2010-09-29 DIAGNOSIS — I10 Essential (primary) hypertension: Secondary | ICD-10-CM

## 2010-09-29 DIAGNOSIS — I2510 Atherosclerotic heart disease of native coronary artery without angina pectoris: Secondary | ICD-10-CM

## 2010-09-29 DIAGNOSIS — E119 Type 2 diabetes mellitus without complications: Secondary | ICD-10-CM

## 2010-09-29 DIAGNOSIS — D638 Anemia in other chronic diseases classified elsewhere: Secondary | ICD-10-CM

## 2010-09-29 DIAGNOSIS — M129 Arthropathy, unspecified: Secondary | ICD-10-CM

## 2010-09-29 DIAGNOSIS — D649 Anemia, unspecified: Secondary | ICD-10-CM

## 2010-09-29 DIAGNOSIS — Z79899 Other long term (current) drug therapy: Secondary | ICD-10-CM

## 2010-09-29 LAB — CBC WITH DIFFERENTIAL/PLATELET
BASO%: 0.2 % (ref 0.0–2.0)
MCHC: 33.5 g/dL (ref 31.5–36.0)
MONO#: 0.4 10*3/uL (ref 0.1–0.9)
NEUT#: 3.1 10*3/uL (ref 1.5–6.5)
RBC: 3.44 10*6/uL — ABNORMAL LOW (ref 3.70–5.45)
WBC: 5.9 10*3/uL (ref 3.9–10.3)
lymph#: 2.4 10*3/uL (ref 0.9–3.3)

## 2010-09-29 LAB — MORPHOLOGY: PLT EST: ADEQUATE

## 2010-10-04 LAB — PROTEIN ELECTROPHORESIS, SERUM
Albumin ELP: 49.6 % — ABNORMAL LOW (ref 55.8–66.1)
Beta 2: 6.7 % — ABNORMAL HIGH (ref 3.2–6.5)
Gamma Globulin: 19.7 % — ABNORMAL HIGH (ref 11.1–18.8)

## 2010-10-04 LAB — IRON AND TIBC: UIBC: 219 ug/dL

## 2010-10-04 LAB — COMPREHENSIVE METABOLIC PANEL
ALT: 12 U/L (ref 0–35)
AST: 17 U/L (ref 0–37)
Albumin: 3.7 g/dL (ref 3.5–5.2)
Calcium: 9.3 mg/dL (ref 8.4–10.5)
Chloride: 101 mEq/L (ref 96–112)
Creatinine, Ser: 1.12 mg/dL (ref 0.40–1.20)
Potassium: 3.9 mEq/L (ref 3.5–5.3)
Sodium: 139 mEq/L (ref 135–145)
Total Protein: 6.8 g/dL (ref 6.0–8.3)

## 2010-10-04 LAB — VITAMIN B12: Vitamin B-12: 1415 pg/mL — ABNORMAL HIGH (ref 211–911)

## 2010-10-04 LAB — ERYTHROPOIETIN: Erythropoietin: 26.8 m[IU]/mL (ref 2.6–34.0)

## 2010-10-11 ENCOUNTER — Telehealth: Payer: Self-pay | Admitting: Cardiovascular Disease

## 2010-10-11 NOTE — Telephone Encounter (Signed)
Fax: [phone] Lastest EKG

## 2010-10-12 ENCOUNTER — Ambulatory Visit (INDEPENDENT_AMBULATORY_CARE_PROVIDER_SITE_OTHER): Payer: Medicare Other | Admitting: *Deleted

## 2010-10-12 ENCOUNTER — Encounter: Payer: Medicare Other | Admitting: *Deleted

## 2010-10-12 DIAGNOSIS — Z7901 Long term (current) use of anticoagulants: Secondary | ICD-10-CM

## 2010-10-12 DIAGNOSIS — R0989 Other specified symptoms and signs involving the circulatory and respiratory systems: Secondary | ICD-10-CM

## 2010-10-12 DIAGNOSIS — I4891 Unspecified atrial fibrillation: Secondary | ICD-10-CM

## 2010-10-20 ENCOUNTER — Encounter (HOSPITAL_BASED_OUTPATIENT_CLINIC_OR_DEPARTMENT_OTHER): Payer: Medicare Other | Admitting: Oncology

## 2010-10-20 ENCOUNTER — Other Ambulatory Visit: Payer: Self-pay | Admitting: Oncology

## 2010-10-20 DIAGNOSIS — D638 Anemia in other chronic diseases classified elsewhere: Secondary | ICD-10-CM

## 2010-10-20 LAB — CBC WITH DIFFERENTIAL/PLATELET
BASO%: 0.2 % (ref 0.0–2.0)
Basophils Absolute: 0 10*3/uL (ref 0.0–0.1)
EOS%: 0.2 % (ref 0.0–7.0)
Eosinophils Absolute: 0 10*3/uL (ref 0.0–0.5)
HCT: 35.9 % (ref 34.8–46.6)
HGB: 11.4 g/dL — ABNORMAL LOW (ref 11.6–15.9)
LYMPH%: 41.9 % (ref 14.0–49.7)
MCH: 27.5 pg (ref 25.1–34.0)
MCHC: 31.8 g/dL (ref 31.5–36.0)
MCV: 86.5 fL (ref 79.5–101.0)
MONO#: 0.4 10*3/uL (ref 0.1–0.9)
MONO%: 7.3 % (ref 0.0–14.0)
NEUT#: 2.8 10*3/uL (ref 1.5–6.5)
NEUT%: 50.4 % (ref 38.4–76.8)
Platelets: 212 10*3/uL (ref 145–400)
RBC: 4.15 10*6/uL (ref 3.70–5.45)
RDW: 16 % — ABNORMAL HIGH (ref 11.2–14.5)
WBC: 5.5 10*3/uL (ref 3.9–10.3)
lymph#: 2.3 10*3/uL (ref 0.9–3.3)
nRBC: 0 % (ref 0–0)

## 2010-10-26 ENCOUNTER — Ambulatory Visit (INDEPENDENT_AMBULATORY_CARE_PROVIDER_SITE_OTHER): Payer: Medicare Other | Admitting: *Deleted

## 2010-10-26 DIAGNOSIS — I4891 Unspecified atrial fibrillation: Secondary | ICD-10-CM

## 2010-10-26 DIAGNOSIS — Z7901 Long term (current) use of anticoagulants: Secondary | ICD-10-CM

## 2010-10-28 ENCOUNTER — Encounter: Payer: Medicare Other | Admitting: *Deleted

## 2010-11-04 ENCOUNTER — Other Ambulatory Visit: Payer: Self-pay | Admitting: Cardiovascular Disease

## 2010-11-07 ENCOUNTER — Ambulatory Visit (INDEPENDENT_AMBULATORY_CARE_PROVIDER_SITE_OTHER): Payer: Medicare Other | Admitting: *Deleted

## 2010-11-07 DIAGNOSIS — I4891 Unspecified atrial fibrillation: Secondary | ICD-10-CM

## 2010-11-07 DIAGNOSIS — Z7901 Long term (current) use of anticoagulants: Secondary | ICD-10-CM

## 2010-11-07 LAB — POCT INR: INR: 2.6

## 2010-11-10 ENCOUNTER — Other Ambulatory Visit: Payer: Self-pay | Admitting: Oncology

## 2010-11-10 ENCOUNTER — Encounter (HOSPITAL_BASED_OUTPATIENT_CLINIC_OR_DEPARTMENT_OTHER): Payer: Medicare Other | Admitting: Oncology

## 2010-11-10 DIAGNOSIS — I10 Essential (primary) hypertension: Secondary | ICD-10-CM

## 2010-11-10 DIAGNOSIS — D638 Anemia in other chronic diseases classified elsewhere: Secondary | ICD-10-CM

## 2010-11-10 DIAGNOSIS — I2510 Atherosclerotic heart disease of native coronary artery without angina pectoris: Secondary | ICD-10-CM

## 2010-11-10 LAB — CBC WITH DIFFERENTIAL/PLATELET
Basophils Absolute: 0 10*3/uL (ref 0.0–0.1)
EOS%: 0 % (ref 0.0–7.0)
Eosinophils Absolute: 0 10*3/uL (ref 0.0–0.5)
HGB: 10.5 g/dL — ABNORMAL LOW (ref 11.6–15.9)
NEUT#: 3.9 10*3/uL (ref 1.5–6.5)
RDW: 15.5 % — ABNORMAL HIGH (ref 11.2–14.5)
WBC: 6.8 10*3/uL (ref 3.9–10.3)
lymph#: 2.4 10*3/uL (ref 0.9–3.3)

## 2010-12-01 ENCOUNTER — Encounter (HOSPITAL_BASED_OUTPATIENT_CLINIC_OR_DEPARTMENT_OTHER): Payer: Medicare Other | Admitting: Oncology

## 2010-12-01 ENCOUNTER — Other Ambulatory Visit: Payer: Self-pay | Admitting: Oncology

## 2010-12-01 DIAGNOSIS — D638 Anemia in other chronic diseases classified elsewhere: Secondary | ICD-10-CM

## 2010-12-01 LAB — CBC WITH DIFFERENTIAL/PLATELET
Eosinophils Absolute: 0 10*3/uL (ref 0.0–0.5)
HCT: 36.6 % (ref 34.8–46.6)
LYMPH%: 38.1 % (ref 14.0–49.7)
MONO#: 0.5 10*3/uL (ref 0.1–0.9)
NEUT#: 3.4 10*3/uL (ref 1.5–6.5)
Platelets: 217 10*3/uL (ref 145–400)
RBC: 4.38 10*6/uL (ref 3.70–5.45)
WBC: 6.4 10*3/uL (ref 3.9–10.3)
nRBC: 0 % (ref 0–0)

## 2010-12-05 ENCOUNTER — Ambulatory Visit (INDEPENDENT_AMBULATORY_CARE_PROVIDER_SITE_OTHER): Payer: Medicare Other | Admitting: *Deleted

## 2010-12-05 DIAGNOSIS — I4891 Unspecified atrial fibrillation: Secondary | ICD-10-CM

## 2010-12-05 DIAGNOSIS — Z7901 Long term (current) use of anticoagulants: Secondary | ICD-10-CM

## 2010-12-06 ENCOUNTER — Other Ambulatory Visit: Payer: Self-pay | Admitting: Internal Medicine

## 2010-12-06 DIAGNOSIS — Z1231 Encounter for screening mammogram for malignant neoplasm of breast: Secondary | ICD-10-CM

## 2010-12-16 ENCOUNTER — Ambulatory Visit
Admission: RE | Admit: 2010-12-16 | Discharge: 2010-12-16 | Disposition: A | Discharge status: Home / Self Care | Payer: Medicare Other | Source: Ambulatory Visit | Attending: Internal Medicine | Admitting: Internal Medicine

## 2010-12-16 DIAGNOSIS — Z1231 Encounter for screening mammogram for malignant neoplasm of breast: Secondary | ICD-10-CM

## 2010-12-22 ENCOUNTER — Encounter (HOSPITAL_BASED_OUTPATIENT_CLINIC_OR_DEPARTMENT_OTHER): Payer: Medicare Other | Admitting: Oncology

## 2010-12-22 ENCOUNTER — Other Ambulatory Visit: Payer: Self-pay | Admitting: Oncology

## 2010-12-22 DIAGNOSIS — D638 Anemia in other chronic diseases classified elsewhere: Secondary | ICD-10-CM

## 2010-12-22 LAB — CBC WITH DIFFERENTIAL/PLATELET
Basophils Absolute: 0 10*3/uL (ref 0.0–0.1)
Eosinophils Absolute: 0 10*3/uL (ref 0.0–0.5)
HCT: 36.4 % (ref 34.8–46.6)
HGB: 12 g/dL (ref 11.6–15.9)
MCH: 27.4 pg (ref 25.1–34.0)
MONO#: 0.3 10*3/uL (ref 0.1–0.9)
NEUT#: 3.3 10*3/uL (ref 1.5–6.5)
NEUT%: 56.8 % (ref 38.4–76.8)
WBC: 5.9 10*3/uL (ref 3.9–10.3)
lymph#: 2.2 10*3/uL (ref 0.9–3.3)

## 2011-01-02 ENCOUNTER — Ambulatory Visit (INDEPENDENT_AMBULATORY_CARE_PROVIDER_SITE_OTHER): Payer: Medicare Other | Admitting: *Deleted

## 2011-01-02 DIAGNOSIS — Z7901 Long term (current) use of anticoagulants: Secondary | ICD-10-CM

## 2011-01-02 DIAGNOSIS — I4891 Unspecified atrial fibrillation: Secondary | ICD-10-CM

## 2011-01-06 ENCOUNTER — Other Ambulatory Visit: Payer: Self-pay | Admitting: *Deleted

## 2011-01-06 MED ORDER — METOPROLOL SUCCINATE ER 50 MG PO TB24: 50.0000 mg | ORAL_TABLET | Freq: Every day | ORAL | Status: DC

## 2011-01-06 NOTE — Telephone Encounter (Signed)
Fax received from pharmacy. Refill completed. Jodette Briley RN  

## 2011-01-12 ENCOUNTER — Other Ambulatory Visit: Payer: Self-pay | Admitting: Oncology

## 2011-01-12 ENCOUNTER — Encounter (HOSPITAL_BASED_OUTPATIENT_CLINIC_OR_DEPARTMENT_OTHER): Payer: Medicare Other | Admitting: Oncology

## 2011-01-12 DIAGNOSIS — I2510 Atherosclerotic heart disease of native coronary artery without angina pectoris: Secondary | ICD-10-CM

## 2011-01-12 DIAGNOSIS — D638 Anemia in other chronic diseases classified elsewhere: Secondary | ICD-10-CM

## 2011-01-12 DIAGNOSIS — I10 Essential (primary) hypertension: Secondary | ICD-10-CM

## 2011-01-12 DIAGNOSIS — E119 Type 2 diabetes mellitus without complications: Secondary | ICD-10-CM

## 2011-01-12 LAB — CBC WITH DIFFERENTIAL/PLATELET
BASO%: 0 % (ref 0.0–2.0)
Basophils Absolute: 0 10e3/uL (ref 0.0–0.1)
EOS%: 0 % (ref 0.0–7.0)
Eosinophils Absolute: 0 10e3/uL (ref 0.0–0.5)
HCT: 33.6 % — ABNORMAL LOW (ref 34.8–46.6)
HGB: 11 g/dL — ABNORMAL LOW (ref 11.6–15.9)
LYMPH%: 37.7 % (ref 14.0–49.7)
MCH: 27.4 pg (ref 25.1–34.0)
MCHC: 32.7 g/dL (ref 31.5–36.0)
MCV: 83.8 fL (ref 79.5–101.0)
MONO#: 0.3 10e3/uL (ref 0.1–0.9)
MONO%: 5.2 % (ref 0.0–14.0)
NEUT#: 3.4 10e3/uL (ref 1.5–6.5)
NEUT%: 57.1 % (ref 38.4–76.8)
Platelets: 228 10e3/uL (ref 145–400)
RBC: 4.01 10e6/uL (ref 3.70–5.45)
RDW: 16.1 % — ABNORMAL HIGH (ref 11.2–14.5)
WBC: 6 10e3/uL (ref 3.9–10.3)
lymph#: 2.3 10e3/uL (ref 0.9–3.3)
nRBC: 0 % (ref 0–0)

## 2011-01-12 LAB — FERRITIN: Ferritin: 130 ng/mL (ref 10–291)

## 2011-01-12 LAB — IRON AND TIBC
%SAT: 21 % (ref 20–55)
Iron: 55 ug/dL (ref 42–145)

## 2011-01-30 ENCOUNTER — Encounter: Payer: Medicare Other | Admitting: *Deleted

## 2011-01-31 ENCOUNTER — Ambulatory Visit (INDEPENDENT_AMBULATORY_CARE_PROVIDER_SITE_OTHER): Payer: Medicare Other | Admitting: *Deleted

## 2011-01-31 DIAGNOSIS — I4891 Unspecified atrial fibrillation: Secondary | ICD-10-CM

## 2011-01-31 DIAGNOSIS — Z7901 Long term (current) use of anticoagulants: Secondary | ICD-10-CM

## 2011-02-22 LAB — BASIC METABOLIC PANEL
CO2: 26
Calcium: 8.5
Creatinine, Ser: 1.35 — ABNORMAL HIGH
GFR calc Af Amer: 49 — ABNORMAL LOW
GFR calc non Af Amer: 41 — ABNORMAL LOW
Glucose, Bld: 204 — ABNORMAL HIGH
Glucose, Bld: 239 — ABNORMAL HIGH
Potassium: 4.5
Sodium: 135
Sodium: 137

## 2011-02-22 LAB — CBC
Hemoglobin: 9.1 — ABNORMAL LOW
MCHC: 33.7
Platelets: 231
RBC: 3.02 — ABNORMAL LOW
RBC: 3.12 — ABNORMAL LOW
RDW: 14.5 — ABNORMAL HIGH
RDW: 14.7 — ABNORMAL HIGH

## 2011-02-22 LAB — PROTIME-INR
INR: 1.1
INR: 1.2
Prothrombin Time: 14.5
Prothrombin Time: 15.3 — ABNORMAL HIGH

## 2011-02-22 LAB — APTT: aPTT: 35

## 2011-02-22 LAB — DIFFERENTIAL
Basophils Absolute: 0
Eosinophils Absolute: 0.1
Lymphocytes Relative: 16
Neutrophils Relative %: 81 — ABNORMAL HIGH

## 2011-02-22 LAB — CULTURE, BLOOD (ROUTINE X 2): Culture: NO GROWTH

## 2011-02-23 LAB — URINALYSIS, ROUTINE W REFLEX MICROSCOPIC
Bilirubin Urine: NEGATIVE
Glucose, UA: NEGATIVE
Hgb urine dipstick: NEGATIVE
Specific Gravity, Urine: 1.023
pH: 5.5

## 2011-02-23 LAB — DIFFERENTIAL
Basophils Absolute: 0
Lymphocytes Relative: 17
Monocytes Absolute: 0.5
Neutro Abs: 8.9 — ABNORMAL HIGH
Neutrophils Relative %: 77

## 2011-02-23 LAB — COMPREHENSIVE METABOLIC PANEL
AST: 32
Albumin: 3.5
BUN: 35 — ABNORMAL HIGH
Calcium: 9.1
Chloride: 105
Creatinine, Ser: 1.56 — ABNORMAL HIGH
GFR calc Af Amer: 42 — ABNORMAL LOW
Total Protein: 7.6

## 2011-02-23 LAB — PROTIME-INR
INR: 1.5
INR: 2.8 — ABNORMAL HIGH
INR: 3.2 — ABNORMAL HIGH
Prothrombin Time: 19 — ABNORMAL HIGH
Prothrombin Time: 31.1 — ABNORMAL HIGH
Prothrombin Time: 34.6 — ABNORMAL HIGH

## 2011-02-23 LAB — APTT
aPTT: 39 — ABNORMAL HIGH
aPTT: 50 — ABNORMAL HIGH

## 2011-02-23 LAB — CBC
Hemoglobin: 11.2 — ABNORMAL LOW
RDW: 14.8 — ABNORMAL HIGH

## 2011-02-28 ENCOUNTER — Ambulatory Visit (INDEPENDENT_AMBULATORY_CARE_PROVIDER_SITE_OTHER): Payer: Medicare Other | Admitting: *Deleted

## 2011-02-28 DIAGNOSIS — I4891 Unspecified atrial fibrillation: Secondary | ICD-10-CM

## 2011-02-28 DIAGNOSIS — Z7901 Long term (current) use of anticoagulants: Secondary | ICD-10-CM

## 2011-03-01 ENCOUNTER — Other Ambulatory Visit: Payer: Self-pay | Admitting: Cardiology

## 2011-03-03 ENCOUNTER — Encounter (HOSPITAL_BASED_OUTPATIENT_CLINIC_OR_DEPARTMENT_OTHER): Payer: Medicare Other | Admitting: Oncology

## 2011-03-03 ENCOUNTER — Other Ambulatory Visit: Payer: Self-pay | Admitting: Oncology

## 2011-03-03 DIAGNOSIS — D638 Anemia in other chronic diseases classified elsewhere: Secondary | ICD-10-CM

## 2011-03-03 LAB — CBC WITH DIFFERENTIAL/PLATELET
Eosinophils Absolute: 0 10*3/uL (ref 0.0–0.5)
HCT: 34 % — ABNORMAL LOW (ref 34.8–46.6)
LYMPH%: 44.5 % (ref 14.0–49.7)
MCHC: 32.9 g/dL (ref 31.5–36.0)
MCV: 84 fL (ref 79.5–101.0)
MONO#: 0.4 10*3/uL (ref 0.1–0.9)
MONO%: 5.6 % (ref 0.0–14.0)
NEUT#: 3.4 10*3/uL (ref 1.5–6.5)
NEUT%: 49.8 % (ref 38.4–76.8)
Platelets: 207 10*3/uL (ref 145–400)
RBC: 4.05 10*6/uL (ref 3.70–5.45)
WBC: 6.8 10*3/uL (ref 3.9–10.3)
nRBC: 0 % (ref 0–0)

## 2011-03-29 ENCOUNTER — Telehealth: Payer: Self-pay | Admitting: Oncology

## 2011-03-29 ENCOUNTER — Other Ambulatory Visit: Payer: Self-pay | Admitting: Oncology

## 2011-03-29 ENCOUNTER — Encounter: Payer: Self-pay | Admitting: Oncology

## 2011-03-29 DIAGNOSIS — D638 Anemia in other chronic diseases classified elsewhere: Secondary | ICD-10-CM

## 2011-03-29 HISTORY — DX: Anemia in other chronic diseases classified elsewhere: D63.8

## 2011-03-29 NOTE — Telephone Encounter (Signed)
called pts home informed her to p/u schedule for dec-feb2013 when she comes in for her appt on 11/23

## 2011-03-31 ENCOUNTER — Other Ambulatory Visit: Payer: Self-pay | Admitting: Oncology

## 2011-03-31 ENCOUNTER — Ambulatory Visit (HOSPITAL_BASED_OUTPATIENT_CLINIC_OR_DEPARTMENT_OTHER): Payer: Medicare Other

## 2011-03-31 ENCOUNTER — Telehealth: Payer: Self-pay | Admitting: *Deleted

## 2011-03-31 ENCOUNTER — Other Ambulatory Visit (HOSPITAL_BASED_OUTPATIENT_CLINIC_OR_DEPARTMENT_OTHER): Payer: Medicare Other | Admitting: Lab

## 2011-03-31 VITALS — BP 149/81 | HR 94 | Temp 97.7°F

## 2011-03-31 DIAGNOSIS — D638 Anemia in other chronic diseases classified elsewhere: Secondary | ICD-10-CM

## 2011-03-31 DIAGNOSIS — Z7901 Long term (current) use of anticoagulants: Secondary | ICD-10-CM

## 2011-03-31 DIAGNOSIS — I4891 Unspecified atrial fibrillation: Secondary | ICD-10-CM

## 2011-03-31 DIAGNOSIS — I2510 Atherosclerotic heart disease of native coronary artery without angina pectoris: Secondary | ICD-10-CM

## 2011-03-31 LAB — CBC WITH DIFFERENTIAL/PLATELET
Basophils Absolute: 0 10*3/uL (ref 0.0–0.1)
Eosinophils Absolute: 0 10*3/uL (ref 0.0–0.5)
HCT: 32.5 % — ABNORMAL LOW (ref 34.8–46.6)
HGB: 10.7 g/dL — ABNORMAL LOW (ref 11.6–15.9)
MONO#: 0.5 10*3/uL (ref 0.1–0.9)
NEUT#: 4.3 10*3/uL (ref 1.5–6.5)
NEUT%: 55.6 % (ref 38.4–76.8)
RDW: 16.2 % — ABNORMAL HIGH (ref 11.2–14.5)
lymph#: 2.9 10*3/uL (ref 0.9–3.3)

## 2011-03-31 LAB — PROTIME-INR: INR: 2.1 (ref 2.00–3.50)

## 2011-03-31 MED ORDER — DARBEPOETIN ALFA-POLYSORBATE 500 MCG/ML IJ SOLN
300.0000 ug | Freq: Once | INTRAMUSCULAR | Status: AC
  Administered 2011-03-31: 300 ug via SUBCUTANEOUS
  Filled 2011-03-31: qty 1

## 2011-03-31 NOTE — Telephone Encounter (Signed)
PATIENT CAME IN AND PICKED UP HER NEW SCHEDULE

## 2011-04-11 ENCOUNTER — Ambulatory Visit (INDEPENDENT_AMBULATORY_CARE_PROVIDER_SITE_OTHER): Payer: Medicare Other | Admitting: *Deleted

## 2011-04-11 DIAGNOSIS — I4891 Unspecified atrial fibrillation: Secondary | ICD-10-CM

## 2011-04-28 ENCOUNTER — Other Ambulatory Visit (HOSPITAL_BASED_OUTPATIENT_CLINIC_OR_DEPARTMENT_OTHER): Payer: Medicare Other | Admitting: Lab

## 2011-04-28 ENCOUNTER — Other Ambulatory Visit: Payer: Self-pay | Admitting: Oncology

## 2011-04-28 ENCOUNTER — Ambulatory Visit: Payer: Medicare Other

## 2011-04-28 DIAGNOSIS — D638 Anemia in other chronic diseases classified elsewhere: Secondary | ICD-10-CM

## 2011-04-28 LAB — CBC WITH DIFFERENTIAL/PLATELET
Basophils Absolute: 0 10*3/uL (ref 0.0–0.1)
EOS%: 0 % (ref 0.0–7.0)
HGB: 12.5 g/dL (ref 11.6–15.9)
LYMPH%: 38.1 % (ref 14.0–49.7)
MCH: 28 pg (ref 25.1–34.0)
MCV: 86.5 fL (ref 79.5–101.0)
MONO%: 6.2 % (ref 0.0–14.0)
Platelets: 249 10*3/uL (ref 145–400)
RBC: 4.46 10*6/uL (ref 3.70–5.45)
RDW: 16 % — ABNORMAL HIGH (ref 11.2–14.5)

## 2011-04-28 MED ORDER — DARBEPOETIN ALFA-POLYSORBATE 500 MCG/ML IJ SOLN: 300.0000 ug | Freq: Once | INTRAMUSCULAR | Status: DC

## 2011-05-23 ENCOUNTER — Encounter: Payer: Medicare Other | Admitting: *Deleted

## 2011-05-26 ENCOUNTER — Other Ambulatory Visit: Payer: Medicare Other

## 2011-05-26 ENCOUNTER — Telehealth: Payer: Self-pay | Admitting: Oncology

## 2011-05-26 ENCOUNTER — Ambulatory Visit: Payer: Medicare Other

## 2011-05-26 NOTE — Telephone Encounter (Signed)
pt called and r/s appts frp 1-18 to 1-21

## 2011-05-29 ENCOUNTER — Other Ambulatory Visit: Payer: Medicare Other | Admitting: Lab

## 2011-05-29 ENCOUNTER — Ambulatory Visit: Payer: Medicare Other

## 2011-05-29 DIAGNOSIS — D638 Anemia in other chronic diseases classified elsewhere: Secondary | ICD-10-CM

## 2011-05-29 LAB — CBC WITH DIFFERENTIAL/PLATELET
Basophils Absolute: 0 10*3/uL (ref 0.0–0.1)
HCT: 37.4 % (ref 34.8–46.6)
HGB: 12.4 g/dL (ref 11.6–15.9)
MONO#: 0.4 10*3/uL (ref 0.1–0.9)
NEUT%: 53.9 % (ref 38.4–76.8)
WBC: 7 10*3/uL (ref 3.9–10.3)
lymph#: 2.8 10*3/uL (ref 0.9–3.3)

## 2011-05-29 MED ORDER — DARBEPOETIN ALFA-POLYSORBATE 500 MCG/ML IJ SOLN: 300.0000 ug | Freq: Once | INTRAMUSCULAR | Status: DC

## 2011-05-30 ENCOUNTER — Ambulatory Visit (INDEPENDENT_AMBULATORY_CARE_PROVIDER_SITE_OTHER): Payer: Medicare Other | Admitting: Pharmacist

## 2011-05-30 DIAGNOSIS — Z7901 Long term (current) use of anticoagulants: Secondary | ICD-10-CM

## 2011-05-30 DIAGNOSIS — I4891 Unspecified atrial fibrillation: Secondary | ICD-10-CM

## 2011-06-23 ENCOUNTER — Other Ambulatory Visit: Payer: Medicare Other

## 2011-06-23 ENCOUNTER — Ambulatory Visit: Payer: Medicare Other

## 2011-06-23 ENCOUNTER — Ambulatory Visit: Payer: Medicare Other | Admitting: Oncology

## 2011-07-11 ENCOUNTER — Other Ambulatory Visit: Payer: Self-pay | Admitting: Cardiovascular Disease

## 2011-07-11 MED ORDER — METOPROLOL SUCCINATE ER 50 MG PO TB24: 50.0000 mg | ORAL_TABLET | Freq: Every day | ORAL | Status: DC

## 2011-07-12 ENCOUNTER — Ambulatory Visit (INDEPENDENT_AMBULATORY_CARE_PROVIDER_SITE_OTHER): Payer: Medicaid Other | Admitting: *Deleted

## 2011-07-12 DIAGNOSIS — I4891 Unspecified atrial fibrillation: Secondary | ICD-10-CM

## 2011-07-12 DIAGNOSIS — Z7901 Long term (current) use of anticoagulants: Secondary | ICD-10-CM

## 2011-07-12 LAB — POCT INR: INR: 2.4

## 2011-07-24 ENCOUNTER — Telehealth: Payer: Self-pay | Admitting: Oncology

## 2011-07-24 ENCOUNTER — Ambulatory Visit (HOSPITAL_BASED_OUTPATIENT_CLINIC_OR_DEPARTMENT_OTHER): Payer: Medicaid Other

## 2011-07-24 ENCOUNTER — Other Ambulatory Visit (HOSPITAL_BASED_OUTPATIENT_CLINIC_OR_DEPARTMENT_OTHER): Payer: Medicaid Other

## 2011-07-24 DIAGNOSIS — D638 Anemia in other chronic diseases classified elsewhere: Secondary | ICD-10-CM

## 2011-07-24 DIAGNOSIS — I2510 Atherosclerotic heart disease of native coronary artery without angina pectoris: Secondary | ICD-10-CM

## 2011-07-24 DIAGNOSIS — I10 Essential (primary) hypertension: Secondary | ICD-10-CM

## 2011-07-24 DIAGNOSIS — E119 Type 2 diabetes mellitus without complications: Secondary | ICD-10-CM

## 2011-07-24 LAB — CBC WITH DIFFERENTIAL/PLATELET
BASO%: 0.1 % (ref 0.0–2.0)
Basophils Absolute: 0 10*3/uL (ref 0.0–0.1)
HCT: 36.3 % (ref 34.8–46.6)
HGB: 11.9 g/dL (ref 11.6–15.9)
MCHC: 32.8 g/dL (ref 31.5–36.0)
MONO#: 0.3 10*3/uL (ref 0.1–0.9)
NEUT%: 55.5 % (ref 38.4–76.8)
WBC: 7.1 10*3/uL (ref 3.9–10.3)
lymph#: 2.8 10*3/uL (ref 0.9–3.3)

## 2011-07-24 LAB — IRON AND TIBC
%SAT: 24 % (ref 20–55)
UIBC: 195 ug/dL (ref 125–400)

## 2011-07-24 LAB — BASIC METABOLIC PANEL
BUN: 20 mg/dL (ref 6–23)
Chloride: 101 mEq/L (ref 96–112)
Potassium: 3.9 mEq/L (ref 3.5–5.3)
Sodium: 141 mEq/L (ref 135–145)

## 2011-07-24 LAB — FERRITIN: Ferritin: 188 ng/mL (ref 10–291)

## 2011-07-24 NOTE — Telephone Encounter (Signed)
gve the pt her April-July 2013 appt calendar

## 2011-07-24 NOTE — Progress Notes (Signed)
Aranesp held for Hgb 11.9 per protocol

## 2011-07-26 ENCOUNTER — Encounter: Payer: Self-pay | Admitting: *Deleted

## 2011-07-26 NOTE — Progress Notes (Signed)
Per MD, notified pt that iron studies "look good"

## 2011-08-09 ENCOUNTER — Other Ambulatory Visit: Payer: Self-pay | Admitting: *Deleted

## 2011-08-18 ENCOUNTER — Ambulatory Visit: Payer: Medicaid Other

## 2011-08-18 ENCOUNTER — Other Ambulatory Visit (HOSPITAL_BASED_OUTPATIENT_CLINIC_OR_DEPARTMENT_OTHER): Payer: Medicare Other

## 2011-08-18 DIAGNOSIS — D638 Anemia in other chronic diseases classified elsewhere: Secondary | ICD-10-CM

## 2011-08-18 LAB — CBC WITH DIFFERENTIAL/PLATELET
Eosinophils Absolute: 0 10*3/uL (ref 0.0–0.5)
HCT: 36.4 % (ref 34.8–46.6)
HGB: 12.1 g/dL (ref 11.6–15.9)
LYMPH%: 27 % (ref 14.0–49.7)
MONO#: 0.5 10*3/uL (ref 0.1–0.9)
NEUT#: 4.5 10*3/uL (ref 1.5–6.5)
NEUT%: 65.8 % (ref 38.4–76.8)
Platelets: 225 10*3/uL (ref 145–400)
WBC: 6.8 10*3/uL (ref 3.9–10.3)

## 2011-08-18 NOTE — Progress Notes (Unsigned)
08/18/2011 1030  Patient's HGB was 12.1 today.  Aranesp not indicated. Patient feels good and will notify MD if becomes symptomatic(increased fatigue, shortness of breath).

## 2011-08-23 ENCOUNTER — Encounter: Payer: Self-pay | Admitting: Oncology

## 2011-08-23 ENCOUNTER — Ambulatory Visit (HOSPITAL_BASED_OUTPATIENT_CLINIC_OR_DEPARTMENT_OTHER): Payer: Medicare Other | Admitting: Oncology

## 2011-08-23 ENCOUNTER — Telehealth: Payer: Self-pay | Admitting: *Deleted

## 2011-08-23 VITALS — BP 150/72 | HR 98 | Temp 98.6°F | Ht 62.0 in | Wt 255.5 lb

## 2011-08-23 DIAGNOSIS — D638 Anemia in other chronic diseases classified elsewhere: Secondary | ICD-10-CM

## 2011-08-23 NOTE — Progress Notes (Signed)
OFFICE PROGRESS NOTE  CC  Dorrene German, MD, MD [address] [place] [address] Dr. Reather Littler  DIAGNOSIS: 61 year old female with anemia of chronic disease.  PRIOR THERAPY:  #1 patient has received Aranesp injections every 4 weeks her last injection was given back in January 2013.  CURRENT THERAPY:Aranesp injection 300 mcg every 4 weekly but now to change to every 6 weeks.  INTERVAL HISTORY: ANE CONERLY 61 y.o. female returns for Followup visit today. Her last visit with me was back in September 2012. She however has been seen every 4 weeks for Aranesp injections. And we have been doing CBC check. We have been successfully able to maintain her hemoglobin above 11. Today her hemoglobin is 12.1. Clinically she seems to be doing quite well. She has not received an injection since January 2013. This indicates to me that she may we may have to extend out the interval of how often she is getting the injections. She has no bleeding problems no focal paresthesias no nausea or vomiting no shortness of breath no chest pains or palpitations. Remainder of the 10 point review of systems is negative.  MEDICAL HISTORY: Past Medical History  Diagnosis Date  . Obesities, morbid   . Atrial fibrillation   . Hypertension   . Dyslipidemia   . Diabetes mellitus   . Osteoarthritis   . Chronic disease anemia 03/29/2011    ALLERGIES:  is allergic to codeine; oxycontin; and penicillins.  MEDICATIONS:  Current Outpatient Prescriptions  Medication Sig Dispense Refill  . BENAZEPRIL HCL PO Take by mouth daily.        Marland Kitchen CALCIUM PO Take by mouth 2 (two) times daily.        Marland Kitchen diltiazem (CARDIZEM CD) 240 MG 24 hr capsule Take 240 mg by mouth daily.        . ergocalciferol (VITAMIN D2) 50000 UNITS capsule Take 50,000 Units by mouth once a week.        Marland Kitchen exenatide (BYETTA 10 MCG PEN) 10 MCG/0.04ML SOLN Inject into the skin 2 (two) times daily with a meal.        . glipiZIDE (GLUCOTROL) 5 MG  tablet Take 5 mg by mouth daily.        . metFORMIN (GLUCOPHAGE) 500 MG tablet Take 500 mg by mouth 3 (three) times daily.        . metoprolol succinate (TOPROL-XL) 50 MG 24 hr tablet Take 1 tablet (50 mg total) by mouth daily.  30 tablet  5  . rosuvastatin (CRESTOR) 10 MG tablet Take 10 mg by mouth daily.        . Tamsulosin HCl (FLOMAX) 0.4 MG CAPS Take by mouth daily.        Marland Kitchen warfarin (COUMADIN) 5 MG tablet TAKE AS DIRECTED  90 tablet  3    SURGICAL HISTORY:  Past Surgical History  Procedure Date  . Cholecystectomy   . Tubal ligation   . Replacement total knee bilateral   . Shoulder surgery     RIGHT SHOULDER    REVIEW OF SYSTEMS:  Pertinent items are noted in HPI.   PHYSICAL EXAMINATION:   Generally patient is awake alert in no acute distress. Vital signs are recorded HEENT exam: EOMI PERRLA sclerae anicteric no conjunctival pallor oral mucosa is moist neck is supple there is no palpable cervical supraclavicular or axillary adenopathy. Cardiovascular: Regular rate rhythm abdomen: Soft nontender nondistended bowel sounds are present it is obese. Lungs: Clear to auscultation and percussion. Extremities: +2  edema no cyanosis or clubbing. Neuro: Patient is alert oriented otherwise nonfocal.  ECOG PERFORMANCE STATUS: 1 - Symptomatic but completely ambulatory  Blood pressure 150/72, pulse 98, temperature 98.6 F (37 C), height 5\' 2"  (1.575 m), weight 255 lb 8 oz (115.894 kg).  LABORATORY DATA: Lab Results  Component Value Date   WBC 6.8 08/18/2011   HGB 12.1 08/18/2011   HCT 36.4 08/18/2011   MCV 84.8 08/18/2011   PLT 225 08/18/2011      Chemistry      Component Value Date/Time   NA 141 07/24/2011 1516   K 3.9 07/24/2011 1516   CL 101 07/24/2011 1516   CO2 26 07/24/2011 1516   BUN 20 07/24/2011 1516   CREATININE 1.02 07/24/2011 1516      Component Value Date/Time   CALCIUM 9.5 07/24/2011 1516   ALKPHOS 77 09/29/2010 1404   ALKPHOS 77 09/29/2010 1404   ALKPHOS 77 09/29/2010 1404     ALKPHOS 77 09/29/2010 1404   ALKPHOS 77 09/29/2010 1404   ALKPHOS 77 09/29/2010 1404   AST 17 09/29/2010 1404   AST 17 09/29/2010 1404   AST 17 09/29/2010 1404   AST 17 09/29/2010 1404   AST 17 09/29/2010 1404   AST 17 09/29/2010 1404   ALT 12 09/29/2010 1404   ALT 12 09/29/2010 1404   ALT 12 09/29/2010 1404   ALT 12 09/29/2010 1404   ALT 12 09/29/2010 1404   ALT 12 09/29/2010 1404   BILITOT 0.2* 09/29/2010 1404   BILITOT 0.2* 09/29/2010 1404   BILITOT 0.2* 09/29/2010 1404   BILITOT 0.2* 09/29/2010 1404   BILITOT 0.2* 09/29/2010 1404   BILITOT 0.2* 09/29/2010 1404       RADIOGRAPHIC STUDIES:  No results found.  ASSESSMENT: 61 year old female with multiple medical problems including anemia of chronic disease. Patient has been on supportive therapy consisting of Aranesp injections 300 mg every 4 weeks. However she has responded very nicely and has not had and it injection since January.   PLAN: I have discussed patient's blood counts with her today. Have also did have discussed extending her visits to every 6 weeks for blood count and injection if her hemoglobin is less than 11. She very much wants to do this. I will plan on seeing her in the office in about 6 months time and she will return for CBC and Aranesp injections every 6 weeks.   All questions were answered. The patient knows to call the clinic with any problems, questions or concerns. We can certainly see the patient much sooner if necessary.  I spent 20 minutes counseling the patient face to face. The total time spent in the appointment was 30 minutes.    Drue Second, MD Medical/Oncology [facility] [phone] (beeper) [phone] (Office)  08/23/2011, 3:02 PM

## 2011-08-23 NOTE — Telephone Encounter (Signed)
gave patient appointment for lab and injections starting at 09-22-2011

## 2011-08-23 NOTE — Patient Instructions (Signed)
1. Your blood count looks good today. No shot was given.  2. i have scheduled injections every 6 weeks to begin on 09/22/11.  3. i will see you back in 6 months

## 2011-08-25 ENCOUNTER — Encounter: Payer: Self-pay | Admitting: Cardiovascular Disease

## 2011-08-25 ENCOUNTER — Ambulatory Visit (INDEPENDENT_AMBULATORY_CARE_PROVIDER_SITE_OTHER): Payer: Medicare Other | Admitting: *Deleted

## 2011-08-25 ENCOUNTER — Ambulatory Visit (INDEPENDENT_AMBULATORY_CARE_PROVIDER_SITE_OTHER): Payer: Medicare Other | Admitting: Cardiovascular Disease

## 2011-08-25 VITALS — BP 136/78 | HR 67 | Resp 18 | Ht 66.0 in | Wt 255.0 lb

## 2011-08-25 DIAGNOSIS — Z7901 Long term (current) use of anticoagulants: Secondary | ICD-10-CM

## 2011-08-25 DIAGNOSIS — I4891 Unspecified atrial fibrillation: Secondary | ICD-10-CM

## 2011-08-25 DIAGNOSIS — I10 Essential (primary) hypertension: Secondary | ICD-10-CM

## 2011-08-25 NOTE — Assessment & Plan Note (Signed)
Her blood pressure is well controlled. 

## 2011-08-25 NOTE — Progress Notes (Signed)
Megan Fischer Date of Birth  [birth year] [facility] [address]     [address] [place], [place]  [address] [phone]   Problem List 1. HTN 2. Anemia - iron deficiency 3. Diabetes Mellitus 4. Paroxysmal Atrial Fibrillation - [place] [facility] 5. S/p knee replacements bilaterally 6. 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 She presents today for followup visit. She does not have any complaints.  She has lost around 65 lbs since last year.  Current Outpatient Prescriptions on File Prior to Visit  Medication Sig Dispense Refill  . BENAZEPRIL HCL PO Take by mouth daily.       Marland Kitchen CALCIUM PO Take by mouth 2 (two) times daily.        Marland Kitchen diltiazem (CARDIZEM CD) 240 MG 24 hr capsule Take 240 mg by mouth daily.        . ergocalciferol (VITAMIN D2) 50000 UNITS capsule Take 50,000 Units by mouth once a week.        Marland Kitchen exenatide (BYETTA 10 MCG PEN) 10 MCG/0.04ML SOLN Inject into the skin 2 (two) times daily with a meal.        . glipiZIDE (GLUCOTROL) 5 MG tablet Take 5 mg by mouth daily.        . metFORMIN (GLUCOPHAGE) 500 MG tablet Take 500 mg by mouth 3 (three) times daily.        . metoprolol succinate (TOPROL-XL) 50 MG 24 hr tablet Take 1 tablet (50 mg total) by mouth daily.  30 tablet  5  . rosuvastatin (CRESTOR) 10 MG tablet Take 10 mg by mouth daily.        . Tamsulosin HCl (FLOMAX) 0.4 MG CAPS Take by mouth daily.        Marland Kitchen warfarin (COUMADIN) 5 MG tablet TAKE AS DIRECTED  90 tablet  3    Allergies  Allergen Reactions  . Codeine   . Oxycontin   . Penicillins     Past Medical History  Diagnosis Date  . Obesities, morbid   . Atrial fibrillation   . Hypertension   . Dyslipidemia   . Diabetes mellitus   . Osteoarthritis   . Chronic disease  anemia 03/29/2011    Past Surgical History  Procedure Date  . Cholecystectomy   . Tubal ligation   . Replacement total knee bilateral   . Shoulder surgery     RIGHT SHOULDER    History  Smoking status  . Never Smoker   Smokeless tobacco  . Not on file    History  Alcohol Use No    Family History  Problem Relation Age of Onset  . Heart attack Mother   . Stroke Mother   . Diabetes Mother   . Hypertension Brother     Reviw of Systems:  Reviewed in the HPI.  All other systems are negative.  Physical Exam: BP 136/78  Pulse 67  Resp 18  Ht 5\' 6"  (1.676 m)  Wt 255 lb (115.667 kg)  BMI 41.16 kg/m2 The patient is alert and oriented x 3.  The mood and affect are normal.  The skin is warm and dry.  Color is normal.  The HEENT exam reveals that the sclera are nonicteric.  The mucous membranes are moist.  The carotids are 2+ without bruits.  There is no thyromegaly.  There is no JVD.  The lungs are clear.  The chest wall is non tender.  The heart exam reveals a regular rate with a normal S1 and S2.  She has frequent premature beats.  There are no murmurs, gallops, or rubs.  The PMI is not displaced.   Abdominal exam reveals good bowel sounds.  There is no guarding or rebound.  There is no hepatosplenomegaly or tenderness.  There are no masses.  Exam of the legs reveal no clubbing, cyanosis, or edema.  The legs are without rashes.  The distal pulses are intact.  Cranial nerves II - XII are intact.  Motor and sensory functions are intact.  The gait is normal.  ECG: Normal sinus rhythm with frequent premature atrial contractions. She has nonspecific T-wave abnormality. Assessment / Plan:

## 2011-08-25 NOTE — Assessment & Plan Note (Addendum)
Megan Fischer has a history of paroxysmal atrial fibrillation. She's basically asymptomatic. We'll continue with Coumadin. She is followed in our Coumadin clinic.  She needs to have knee surgery. She is at low to moderate risk for any cardiovascular consultations. Most of her risk would be associated with other noncardiac problems such as her morbid obesity. She has my permission to hold her Coumadin for 5 days prior to having the surgery. She should be started the day after surgery.

## 2011-08-25 NOTE — Patient Instructions (Signed)
Your physician wants you to follow-up in: 1 YEAR  You will receive a reminder letter in the mail two months in advance. If you don't receive a letter, please call our office to schedule the follow-up appointment.   Your physician recommends that you return for a FASTING lipid profile: 1 YEAR   

## 2011-09-15 ENCOUNTER — Ambulatory Visit: Payer: Medicare Other

## 2011-09-15 ENCOUNTER — Other Ambulatory Visit: Payer: Medicare Other

## 2011-09-22 ENCOUNTER — Ambulatory Visit: Payer: Medicare Other

## 2011-09-22 ENCOUNTER — Other Ambulatory Visit (HOSPITAL_BASED_OUTPATIENT_CLINIC_OR_DEPARTMENT_OTHER): Payer: Medicare Other | Admitting: Lab

## 2011-09-22 DIAGNOSIS — D638 Anemia in other chronic diseases classified elsewhere: Secondary | ICD-10-CM

## 2011-09-22 LAB — CBC WITH DIFFERENTIAL/PLATELET
Basophils Absolute: 0 10*3/uL (ref 0.0–0.1)
Eosinophils Absolute: 0 10*3/uL (ref 0.0–0.5)
HGB: 11.6 g/dL (ref 11.6–15.9)
MCV: 84.6 fL (ref 79.5–101.0)
MONO#: 0.4 10*3/uL (ref 0.1–0.9)
MONO%: 5.7 % (ref 0.0–14.0)
NEUT#: 3.5 10*3/uL (ref 1.5–6.5)
Platelets: 244 10*3/uL (ref 145–400)
RDW: 15.4 % — ABNORMAL HIGH (ref 11.2–14.5)

## 2011-09-22 MED ORDER — DARBEPOETIN ALFA-POLYSORBATE 500 MCG/ML IJ SOLN: 300.0000 ug | Freq: Once | INTRAMUSCULAR | Status: DC

## 2011-10-06 ENCOUNTER — Ambulatory Visit (INDEPENDENT_AMBULATORY_CARE_PROVIDER_SITE_OTHER): Payer: Medicare Other | Admitting: *Deleted

## 2011-10-06 DIAGNOSIS — Z7901 Long term (current) use of anticoagulants: Secondary | ICD-10-CM

## 2011-10-06 DIAGNOSIS — I4891 Unspecified atrial fibrillation: Secondary | ICD-10-CM

## 2011-10-06 LAB — POCT INR: INR: 2.8

## 2011-10-13 ENCOUNTER — Other Ambulatory Visit: Payer: Medicare Other

## 2011-10-13 ENCOUNTER — Ambulatory Visit: Payer: Medicare Other

## 2011-11-03 ENCOUNTER — Other Ambulatory Visit (HOSPITAL_BASED_OUTPATIENT_CLINIC_OR_DEPARTMENT_OTHER): Payer: Medicare Other | Admitting: Lab

## 2011-11-03 ENCOUNTER — Ambulatory Visit: Payer: Medicare Other

## 2011-11-03 DIAGNOSIS — D638 Anemia in other chronic diseases classified elsewhere: Secondary | ICD-10-CM

## 2011-11-03 LAB — CBC WITH DIFFERENTIAL/PLATELET
Eosinophils Absolute: 0 10*3/uL (ref 0.0–0.5)
LYMPH%: 38.5 % (ref 14.0–49.7)
MONO#: 0.5 10*3/uL (ref 0.1–0.9)
NEUT#: 3.7 10*3/uL (ref 1.5–6.5)
Platelets: 247 10*3/uL (ref 145–400)
RBC: 3.88 10*6/uL (ref 3.70–5.45)
RDW: 15.1 % — ABNORMAL HIGH (ref 11.2–14.5)
WBC: 6.9 10*3/uL (ref 3.9–10.3)
lymph#: 2.6 10*3/uL (ref 0.9–3.3)
nRBC: 0 % (ref 0–0)

## 2011-11-03 MED ORDER — DARBEPOETIN ALFA-POLYSORBATE 500 MCG/ML IJ SOLN: 300.0000 ug | Freq: Once | INTRAMUSCULAR | Status: DC

## 2011-11-06 ENCOUNTER — Other Ambulatory Visit: Payer: Self-pay | Admitting: *Deleted

## 2011-11-06 MED ORDER — WARFARIN SODIUM 5 MG PO TABS: ORAL_TABLET | ORAL | Status: DC

## 2011-11-10 ENCOUNTER — Other Ambulatory Visit: Payer: Medicare Other

## 2011-11-10 ENCOUNTER — Ambulatory Visit: Payer: Medicare Other

## 2011-11-17 ENCOUNTER — Ambulatory Visit (INDEPENDENT_AMBULATORY_CARE_PROVIDER_SITE_OTHER): Payer: Medicare Other

## 2011-11-17 DIAGNOSIS — Z7901 Long term (current) use of anticoagulants: Secondary | ICD-10-CM

## 2011-11-17 DIAGNOSIS — I4891 Unspecified atrial fibrillation: Secondary | ICD-10-CM

## 2011-11-17 LAB — POCT INR: INR: 3

## 2011-12-15 ENCOUNTER — Other Ambulatory Visit (HOSPITAL_BASED_OUTPATIENT_CLINIC_OR_DEPARTMENT_OTHER): Payer: Medicare Other | Admitting: Lab

## 2011-12-15 ENCOUNTER — Ambulatory Visit (HOSPITAL_BASED_OUTPATIENT_CLINIC_OR_DEPARTMENT_OTHER): Payer: Medicare Other

## 2011-12-15 VITALS — BP 137/79 | HR 97 | Temp 98.4°F

## 2011-12-15 DIAGNOSIS — D638 Anemia in other chronic diseases classified elsewhere: Secondary | ICD-10-CM

## 2011-12-15 LAB — CBC WITH DIFFERENTIAL/PLATELET
Basophils Absolute: 0 10*3/uL (ref 0.0–0.1)
Eosinophils Absolute: 0 10*3/uL (ref 0.0–0.5)
HGB: 10.8 g/dL — ABNORMAL LOW (ref 11.6–15.9)
MONO#: 0.4 10*3/uL (ref 0.1–0.9)
NEUT#: 4 10*3/uL (ref 1.5–6.5)
RBC: 3.79 10*6/uL (ref 3.70–5.45)
RDW: 14.7 % — ABNORMAL HIGH (ref 11.2–14.5)
WBC: 7 10*3/uL (ref 3.9–10.3)
lymph#: 2.5 10*3/uL (ref 0.9–3.3)

## 2011-12-15 MED ORDER — DARBEPOETIN ALFA-POLYSORBATE 300 MCG/0.6ML IJ SOLN
300.0000 ug | Freq: Once | INTRAMUSCULAR | Status: AC
  Administered 2011-12-15: 300 ug via SUBCUTANEOUS
  Filled 2011-12-15: qty 0.6

## 2011-12-29 ENCOUNTER — Ambulatory Visit (INDEPENDENT_AMBULATORY_CARE_PROVIDER_SITE_OTHER): Payer: Medicare Other | Admitting: *Deleted

## 2011-12-29 DIAGNOSIS — Z7901 Long term (current) use of anticoagulants: Secondary | ICD-10-CM

## 2011-12-29 DIAGNOSIS — I4891 Unspecified atrial fibrillation: Secondary | ICD-10-CM

## 2012-01-01 ENCOUNTER — Ambulatory Visit: Payer: Medicare Other

## 2012-01-01 ENCOUNTER — Telehealth: Payer: Self-pay | Admitting: *Deleted

## 2012-01-01 ENCOUNTER — Other Ambulatory Visit: Payer: Medicare Other

## 2012-01-01 MED ORDER — METOPROLOL SUCCINATE ER 50 MG PO TB24: 50.0000 mg | ORAL_TABLET | Freq: Every day | ORAL | Status: DC

## 2012-01-01 NOTE — Telephone Encounter (Signed)
Message copied by Antony Odea on Mon Jan 01, 2012  3:59 PM ------      Message from: Carmela Hurt      Created: Fri Dec 29, 2011 11:14 AM      Regarding: needs a refill       Refill for metopralol to [facility] for 90 DAY SUPPLY.

## 2012-01-01 NOTE — Telephone Encounter (Signed)
Re order completed 

## 2012-01-15 ENCOUNTER — Other Ambulatory Visit (HOSPITAL_BASED_OUTPATIENT_CLINIC_OR_DEPARTMENT_OTHER): Payer: Medicare Other | Admitting: Lab

## 2012-01-15 ENCOUNTER — Encounter: Payer: Self-pay | Admitting: Oncology

## 2012-01-15 ENCOUNTER — Ambulatory Visit (HOSPITAL_BASED_OUTPATIENT_CLINIC_OR_DEPARTMENT_OTHER): Payer: Medicare Other | Admitting: Oncology

## 2012-01-15 ENCOUNTER — Telehealth: Payer: Self-pay | Admitting: *Deleted

## 2012-01-15 VITALS — BP 147/84 | HR 86 | Temp 98.9°F | Resp 20 | Ht 66.0 in | Wt 250.0 lb

## 2012-01-15 DIAGNOSIS — E119 Type 2 diabetes mellitus without complications: Secondary | ICD-10-CM

## 2012-01-15 DIAGNOSIS — D638 Anemia in other chronic diseases classified elsewhere: Secondary | ICD-10-CM

## 2012-01-15 DIAGNOSIS — I4891 Unspecified atrial fibrillation: Secondary | ICD-10-CM

## 2012-01-15 DIAGNOSIS — I10 Essential (primary) hypertension: Secondary | ICD-10-CM

## 2012-01-15 LAB — CBC WITH DIFFERENTIAL/PLATELET
Basophils Absolute: 0 10*3/uL (ref 0.0–0.1)
Eosinophils Absolute: 0 10*3/uL (ref 0.0–0.5)
HCT: 37 % (ref 34.8–46.6)
HGB: 12.1 g/dL (ref 11.6–15.9)
MCH: 28.7 pg (ref 25.1–34.0)
MONO#: 0.3 10*3/uL (ref 0.1–0.9)
NEUT#: 3.9 10*3/uL (ref 1.5–6.5)
NEUT%: 62.9 % (ref 38.4–76.8)
WBC: 6.2 10*3/uL (ref 3.9–10.3)
lymph#: 1.9 10*3/uL (ref 0.9–3.3)

## 2012-01-15 LAB — IRON AND TIBC: Iron: 47 ug/dL (ref 42–145)

## 2012-01-15 LAB — FERRITIN: Ferritin: 156 ng/mL (ref 10–291)

## 2012-01-15 NOTE — Progress Notes (Signed)
OFFICE PROGRESS NOTE  CC  Dorrene German, MD [address] [place] [address] Dr. Reather Littler  DIAGNOSIS: 61 year old female with anemia of chronic disease.  PRIOR THERAPY:  #1 patient has received Aranesp injections every 4 weeks her last injection was given back in January 2013.  CURRENT THERAPY:Aranesp injection 300 mcg every 6 weeks  INTERVAL HISTORY: Megan Fischer 61 y.o. female returns for Followup visit today. Patient is doing well her hemoglobin looks terrific today. She is denying any shortness of breath or chest pains. She has no bleeding. She is tolerating Aranesp injections very well. These aren't being given every 6 weeks now and is accomplishing the past of keeping her hemoglobin above 11. Today his hemoglobin is above 12. Remainder of the review of systems is negative. MEDICAL HISTORY: Past Medical History  Diagnosis Date  . Obesities, morbid   . Atrial fibrillation   . Hypertension   . Dyslipidemia   . Diabetes mellitus   . Osteoarthritis   . Chronic disease anemia 03/29/2011    ALLERGIES:  is allergic to codeine; oxycodone hcl er; and penicillins.  MEDICATIONS:  Current Outpatient Prescriptions  Medication Sig Dispense Refill  . BENAZEPRIL HCL PO Take by mouth daily.       Marland Kitchen CALCIUM PO Take by mouth 2 (two) times daily.        Marland Kitchen diltiazem (CARDIZEM CD) 240 MG 24 hr capsule Take 240 mg by mouth daily.        . ergocalciferol (VITAMIN D2) 50000 UNITS capsule Take 50,000 Units by mouth once a week.        Marland Kitchen glipiZIDE (GLUCOTROL) 5 MG tablet Take 5 mg by mouth daily.        . Liraglutide (VICTOZA) 18 MG/3ML SOLN Inject 1.2 mg into the skin daily.      . metFORMIN (GLUCOPHAGE) 500 MG tablet Take 500 mg by mouth 3 (three) times daily.        . metoprolol succinate (TOPROL-XL) 50 MG 24 hr tablet Take 1 tablet (50 mg total) by mouth daily.  90 tablet  1  . rosuvastatin (CRESTOR) 10 MG tablet Take 10 mg by mouth daily.        . Tamsulosin HCl (FLOMAX) 0.4  MG CAPS Take by mouth daily.        Marland Kitchen warfarin (COUMADIN) 5 MG tablet Take as directed by coumadin clinic  90 tablet  1  . exenatide (BYETTA 10 MCG PEN) 10 MCG/0.04ML SOLN Inject into the skin 2 (two) times daily with a meal.          SURGICAL HISTORY:  Past Surgical History  Procedure Date  . Cholecystectomy   . Tubal ligation   . Replacement total knee bilateral   . Shoulder surgery     RIGHT SHOULDER    REVIEW OF SYSTEMS:  Pertinent items are noted in HPI.   PHYSICAL EXAMINATION:   Generally patient is awake alert in no acute distress. Vital signs are recorded HEENT exam: EOMI PERRLA sclerae anicteric no conjunctival pallor oral mucosa is moist neck is supple there is no palpable cervical supraclavicular or axillary adenopathy. Cardiovascular: Regular rate rhythm abdomen: Soft nontender nondistended bowel sounds are present it is obese. Lungs: Clear to auscultation and percussion. Extremities: +2 edema no cyanosis or clubbing. Neuro: Patient is alert oriented otherwise nonfocal.  ECOG PERFORMANCE STATUS: 1 - Symptomatic but completely ambulatory  Blood pressure 147/84, pulse 86, temperature 98.9 F (37.2 C), temperature source Oral, resp. rate 20, height  5\' 6"  (1.676 m), weight 250 lb (113.399 kg).  LABORATORY DATA: Lab Results  Component Value Date   WBC 6.2 01/15/2012   HGB 12.1 01/15/2012   HCT 37.0 01/15/2012   MCV 87.8 01/15/2012   PLT 222 01/15/2012      Chemistry      Component Value Date/Time   NA 141 07/24/2011 1516   K 3.9 07/24/2011 1516   CL 101 07/24/2011 1516   CO2 26 07/24/2011 1516   BUN 20 07/24/2011 1516   CREATININE 1.02 07/24/2011 1516      Component Value Date/Time   CALCIUM 9.5 07/24/2011 1516   ALKPHOS 77 09/29/2010 1404   ALKPHOS 77 09/29/2010 1404   ALKPHOS 77 09/29/2010 1404   ALKPHOS 77 09/29/2010 1404   ALKPHOS 77 09/29/2010 1404   ALKPHOS 77 09/29/2010 1404   AST 17 09/29/2010 1404   AST 17 09/29/2010 1404   AST 17 09/29/2010 1404   AST 17 09/29/2010 1404    AST 17 09/29/2010 1404   AST 17 09/29/2010 1404   ALT 12 09/29/2010 1404   ALT 12 09/29/2010 1404   ALT 12 09/29/2010 1404   ALT 12 09/29/2010 1404   ALT 12 09/29/2010 1404   ALT 12 09/29/2010 1404   BILITOT 0.2* 09/29/2010 1404   BILITOT 0.2* 09/29/2010 1404   BILITOT 0.2* 09/29/2010 1404   BILITOT 0.2* 09/29/2010 1404   BILITOT 0.2* 09/29/2010 1404   BILITOT 0.2* 09/29/2010 1404       RADIOGRAPHIC STUDIES:  No results found.  ASSESSMENT: 61 year old female with multiple medical problems including anemia of chronic disease. Patient has been on supportive therapy consisting of Aranesp injections 300 mg every 6 weeks. However she has responded very nicely and has not had and it injection since January.   PLAN: I have discussed patient's blood counts with her today. She does not need Aranesp injection today.She will return in 6 weeks' time for another CBC and injection if needed.  All questions were answered. The patient knows to call the clinic with any problems, questions or concerns. We can certainly see the patient much sooner if necessary.  I spent >15 minutes counseling the patient face to face. The total time spent in the appointment was 30 minutes.    Megan Second, MD Medical/Oncology [facility] [phone] (beeper) [phone] (Office)  01/15/2012, 10:42 AM

## 2012-01-15 NOTE — Patient Instructions (Addendum)
Continue aranesp injections every 6 weeks.  I will see you back in April 2014

## 2012-01-15 NOTE — Telephone Encounter (Signed)
aranesp injections q 6 weeks begin 08/14/12 x 10  see kk on 08/14/12 with labs

## 2012-01-18 ENCOUNTER — Ambulatory Visit (INDEPENDENT_AMBULATORY_CARE_PROVIDER_SITE_OTHER): Payer: Medicare Other | Admitting: *Deleted

## 2012-01-18 DIAGNOSIS — Z7901 Long term (current) use of anticoagulants: Secondary | ICD-10-CM

## 2012-01-18 DIAGNOSIS — I4891 Unspecified atrial fibrillation: Secondary | ICD-10-CM

## 2012-01-26 ENCOUNTER — Other Ambulatory Visit (HOSPITAL_BASED_OUTPATIENT_CLINIC_OR_DEPARTMENT_OTHER): Payer: Medicare Other | Admitting: Lab

## 2012-01-26 ENCOUNTER — Ambulatory Visit: Payer: Medicare Other

## 2012-01-26 DIAGNOSIS — D638 Anemia in other chronic diseases classified elsewhere: Secondary | ICD-10-CM

## 2012-01-26 LAB — CBC WITH DIFFERENTIAL/PLATELET
BASO%: 0.3 % (ref 0.0–2.0)
LYMPH%: 29.9 % (ref 14.0–49.7)
MCHC: 32.8 g/dL (ref 31.5–36.0)
MCV: 88.1 fL (ref 79.5–101.0)
MONO%: 5.3 % (ref 0.0–14.0)
Platelets: 208 10*3/uL (ref 145–400)
RBC: 4.07 10*6/uL (ref 3.70–5.45)

## 2012-01-26 MED ORDER — DARBEPOETIN ALFA-POLYSORBATE 500 MCG/ML IJ SOLN: 300.0000 ug | Freq: Once | INTRAMUSCULAR | Status: DC

## 2012-01-29 ENCOUNTER — Other Ambulatory Visit: Payer: Self-pay | Admitting: Cardiovascular Disease

## 2012-01-29 MED ORDER — METOPROLOL SUCCINATE ER 50 MG PO TB24: 50.0000 mg | ORAL_TABLET | Freq: Every day | ORAL | Status: DC

## 2012-01-29 MED ORDER — WARFARIN SODIUM 5 MG PO TABS: ORAL_TABLET | ORAL | Status: DC

## 2012-01-29 NOTE — Telephone Encounter (Signed)
New Problem:    Patient called in because she has switched permanent pharmacies and is now getting all of her medications filled at the [facility] on [address] (phone [phone]).  Patient needs 90 day refills of her warfarin (COUMADIN) 5 MG tablet and metoprolol succinate (TOPROL-XL) 50 MG 24 hr tablet.

## 2012-02-16 ENCOUNTER — Ambulatory Visit (INDEPENDENT_AMBULATORY_CARE_PROVIDER_SITE_OTHER): Payer: Medicare Other | Admitting: *Deleted

## 2012-02-16 DIAGNOSIS — Z7901 Long term (current) use of anticoagulants: Secondary | ICD-10-CM

## 2012-02-16 DIAGNOSIS — I4891 Unspecified atrial fibrillation: Secondary | ICD-10-CM

## 2012-02-16 LAB — POCT INR: INR: 3

## 2012-03-08 ENCOUNTER — Other Ambulatory Visit (HOSPITAL_BASED_OUTPATIENT_CLINIC_OR_DEPARTMENT_OTHER): Payer: Medicare Other | Admitting: Lab

## 2012-03-08 ENCOUNTER — Ambulatory Visit: Payer: Medicare Other

## 2012-03-08 DIAGNOSIS — D638 Anemia in other chronic diseases classified elsewhere: Secondary | ICD-10-CM

## 2012-03-08 LAB — CBC WITH DIFFERENTIAL/PLATELET
BASO%: 0.4 % (ref 0.0–2.0)
EOS%: 0 % (ref 0.0–7.0)
HCT: 36.4 % (ref 34.8–46.6)
MCH: 28.6 pg (ref 25.1–34.0)
MCHC: 32.9 g/dL (ref 31.5–36.0)
MONO%: 7.1 % (ref 0.0–14.0)
NEUT%: 62.1 % (ref 38.4–76.8)
RDW: 16.2 % — ABNORMAL HIGH (ref 11.2–14.5)
lymph#: 2.1 10*3/uL (ref 0.9–3.3)

## 2012-03-08 NOTE — Progress Notes (Signed)
hgb 12, aranesp not indicated.  dmr

## 2012-03-15 ENCOUNTER — Ambulatory Visit (INDEPENDENT_AMBULATORY_CARE_PROVIDER_SITE_OTHER): Payer: Medicare Other | Admitting: *Deleted

## 2012-03-15 DIAGNOSIS — Z7901 Long term (current) use of anticoagulants: Secondary | ICD-10-CM

## 2012-03-15 DIAGNOSIS — I4891 Unspecified atrial fibrillation: Secondary | ICD-10-CM

## 2012-04-19 ENCOUNTER — Ambulatory Visit: Payer: Medicare Other

## 2012-04-19 ENCOUNTER — Other Ambulatory Visit (HOSPITAL_BASED_OUTPATIENT_CLINIC_OR_DEPARTMENT_OTHER): Payer: Medicare Other | Admitting: Lab

## 2012-04-19 DIAGNOSIS — D638 Anemia in other chronic diseases classified elsewhere: Secondary | ICD-10-CM

## 2012-04-19 LAB — CBC WITH DIFFERENTIAL/PLATELET
BASO%: 0.3 % (ref 0.0–2.0)
EOS%: 0 % (ref 0.0–7.0)
Eosinophils Absolute: 0 10*3/uL (ref 0.0–0.5)
MCH: 29.1 pg (ref 25.1–34.0)
MCHC: 33.3 g/dL (ref 31.5–36.0)
MCV: 87.3 fL (ref 79.5–101.0)
MONO%: 5.3 % (ref 0.0–14.0)
NEUT#: 3.7 10*3/uL (ref 1.5–6.5)
RBC: 4.03 10*6/uL (ref 3.70–5.45)
RDW: 16 % — ABNORMAL HIGH (ref 11.2–14.5)

## 2012-04-19 MED ORDER — DARBEPOETIN ALFA-POLYSORBATE 500 MCG/ML IJ SOLN: 300.0000 ug | Freq: Once | INTRAMUSCULAR | Status: DC

## 2012-04-26 ENCOUNTER — Ambulatory Visit (INDEPENDENT_AMBULATORY_CARE_PROVIDER_SITE_OTHER): Payer: Medicare Other | Admitting: *Deleted

## 2012-04-26 DIAGNOSIS — Z7901 Long term (current) use of anticoagulants: Secondary | ICD-10-CM

## 2012-04-26 DIAGNOSIS — I4891 Unspecified atrial fibrillation: Secondary | ICD-10-CM

## 2012-04-26 LAB — POCT INR: INR: 1.7

## 2012-05-14 ENCOUNTER — Ambulatory Visit (INDEPENDENT_AMBULATORY_CARE_PROVIDER_SITE_OTHER): Payer: Medicare Other

## 2012-05-14 DIAGNOSIS — Z7901 Long term (current) use of anticoagulants: Secondary | ICD-10-CM

## 2012-05-14 DIAGNOSIS — I4891 Unspecified atrial fibrillation: Secondary | ICD-10-CM

## 2012-05-31 ENCOUNTER — Ambulatory Visit: Payer: Medicaid Other

## 2012-05-31 ENCOUNTER — Other Ambulatory Visit: Payer: Medicaid Other | Admitting: Lab

## 2012-06-07 ENCOUNTER — Ambulatory Visit (INDEPENDENT_AMBULATORY_CARE_PROVIDER_SITE_OTHER): Payer: Medicare Other | Admitting: *Deleted

## 2012-06-07 DIAGNOSIS — I4891 Unspecified atrial fibrillation: Secondary | ICD-10-CM

## 2012-06-07 DIAGNOSIS — Z7901 Long term (current) use of anticoagulants: Secondary | ICD-10-CM

## 2012-07-03 ENCOUNTER — Other Ambulatory Visit: Payer: Self-pay

## 2012-07-03 MED ORDER — METOPROLOL SUCCINATE ER 50 MG PO TB24: 50.0000 mg | ORAL_TABLET | Freq: Every day | ORAL | Status: DC

## 2012-07-05 ENCOUNTER — Ambulatory Visit (INDEPENDENT_AMBULATORY_CARE_PROVIDER_SITE_OTHER): Payer: Medicare Other | Admitting: *Deleted

## 2012-07-05 DIAGNOSIS — I4891 Unspecified atrial fibrillation: Secondary | ICD-10-CM

## 2012-07-05 DIAGNOSIS — Z7901 Long term (current) use of anticoagulants: Secondary | ICD-10-CM

## 2012-07-11 ENCOUNTER — Other Ambulatory Visit: Payer: Self-pay

## 2012-07-11 MED ORDER — WARFARIN SODIUM 5 MG PO TABS: ORAL_TABLET | ORAL | Status: DC

## 2012-07-12 ENCOUNTER — Telehealth: Payer: Self-pay | Admitting: Oncology

## 2012-07-12 ENCOUNTER — Ambulatory Visit: Payer: Medicaid Other

## 2012-07-12 ENCOUNTER — Other Ambulatory Visit: Payer: Medicare Other | Admitting: Lab

## 2012-07-12 NOTE — Telephone Encounter (Signed)
Pt called to cancel her appt due to she has no power.

## 2012-07-15 ENCOUNTER — Ambulatory Visit: Payer: Medicare Other

## 2012-07-15 ENCOUNTER — Other Ambulatory Visit (HOSPITAL_BASED_OUTPATIENT_CLINIC_OR_DEPARTMENT_OTHER): Payer: Medicare Other | Admitting: Lab

## 2012-07-15 DIAGNOSIS — D638 Anemia in other chronic diseases classified elsewhere: Secondary | ICD-10-CM

## 2012-07-15 LAB — CBC WITH DIFFERENTIAL/PLATELET
Basophils Absolute: 0 10*3/uL (ref 0.0–0.1)
Eosinophils Absolute: 0 10*3/uL (ref 0.0–0.5)
HCT: 35 % (ref 34.8–46.6)
HGB: 11.6 g/dL (ref 11.6–15.9)
LYMPH%: 27.9 % (ref 14.0–49.7)
MCHC: 33.1 g/dL (ref 31.5–36.0)
MONO#: 0.4 10*3/uL (ref 0.1–0.9)
NEUT#: 3.9 10*3/uL (ref 1.5–6.5)
NEUT%: 64.5 % (ref 38.4–76.8)
Platelets: 235 10*3/uL (ref 145–400)
WBC: 6 10*3/uL (ref 3.9–10.3)
lymph#: 1.7 10*3/uL (ref 0.9–3.3)

## 2012-07-15 NOTE — Progress Notes (Signed)
hgb 11.6 today 07/15/12. Aranesp held, did not meet criteria. Pt. Reported no symptoms and lab reviewed with pt.  Printed report and future appointments reviewed and given. Pt. Instructed to call if she has further questions or issues. HL

## 2012-07-15 NOTE — Patient Instructions (Addendum)
[facility] Discharge Instructions for Patients Receiving Aranesp  Today you received the following:  Aranesp held due to hgb did not meet criteria.    call the clinic if you have further questions or issues. If it is after clinic hours your family physician or the after hours number for the clinic or go to the Emergency Department.   BELOW ARE SYMPTOMS THAT SHOULD BE REPORTED IMMEDIATELY:  *FEVER GREATER THAN 100.5 F  *CHILLS WITH OR WITHOUT FEVER  NAUSEA AND VOMITING THAT IS NOT CONTROLLED WITH YOUR NAUSEA MEDICATION  *UNUSUAL SHORTNESS OF BREATH  *UNUSUAL BRUISING OR BLEEDING  TENDERNESS IN MOUTH AND THROAT WITH OR WITHOUT PRESENCE OF ULCERS  *URINARY PROBLEMS  *BOWEL PROBLEMS  UNUSUAL RASH Items with * indicate a potential emergency and should be followed up as soon as possible.   Please let the nurse know about any problems that you may have experienced. Feel free to call the clinic you have any questions or concerns. The clinic phone number is [phone].   I have been informed and understand all the instructions given to me. I know to contact the clinic, my physician, or go to the Emergency Department if any problems should occur. I do not have any questions at this time, but understand that I may call the clinic during office hours   should I have any questions or need assistance in obtaining follow up care.    __________________________________________  _____________  __________ Signature of Patient or Authorized Representative            Date                   Time    __________________________________________ Nurse's Signature

## 2012-08-02 ENCOUNTER — Ambulatory Visit (INDEPENDENT_AMBULATORY_CARE_PROVIDER_SITE_OTHER): Payer: Medicare Other

## 2012-08-02 DIAGNOSIS — Z7901 Long term (current) use of anticoagulants: Secondary | ICD-10-CM

## 2012-08-02 DIAGNOSIS — I4891 Unspecified atrial fibrillation: Secondary | ICD-10-CM

## 2012-08-14 ENCOUNTER — Telehealth: Payer: Self-pay | Admitting: *Deleted

## 2012-08-14 ENCOUNTER — Other Ambulatory Visit (HOSPITAL_BASED_OUTPATIENT_CLINIC_OR_DEPARTMENT_OTHER): Payer: Medicare Other | Admitting: Lab

## 2012-08-14 ENCOUNTER — Encounter: Payer: Self-pay | Admitting: Adult Health

## 2012-08-14 ENCOUNTER — Ambulatory Visit (HOSPITAL_BASED_OUTPATIENT_CLINIC_OR_DEPARTMENT_OTHER): Payer: Medicare Other | Admitting: Adult Health

## 2012-08-14 VITALS — BP 120/77 | HR 83 | Temp 98.5°F | Resp 20 | Ht 66.0 in | Wt 246.0 lb

## 2012-08-14 DIAGNOSIS — D638 Anemia in other chronic diseases classified elsewhere: Secondary | ICD-10-CM

## 2012-08-14 LAB — IRON AND TIBC
%SAT: 18 % — ABNORMAL LOW (ref 20–55)
Iron: 54 ug/dL (ref 42–145)

## 2012-08-14 LAB — CBC WITH DIFFERENTIAL/PLATELET
Basophils Absolute: 0 10*3/uL (ref 0.0–0.1)
Eosinophils Absolute: 0 10*3/uL (ref 0.0–0.5)
HCT: 35.2 % (ref 34.8–46.6)
HGB: 11.5 g/dL — ABNORMAL LOW (ref 11.6–15.9)
LYMPH%: 38 % (ref 14.0–49.7)
MCV: 87.6 fL (ref 79.5–101.0)
MONO%: 5.4 % (ref 0.0–14.0)
NEUT#: 3.2 10*3/uL (ref 1.5–6.5)
NEUT%: 56.4 % (ref 38.4–76.8)
Platelets: 248 10*3/uL (ref 145–400)
RDW: 14.6 % — ABNORMAL HIGH (ref 11.2–14.5)

## 2012-08-14 NOTE — Progress Notes (Signed)
OFFICE PROGRESS NOTE  CC  Dorrene German, MD [address] [place] [address] Dr. Reather Littler  DIAGNOSIS: 62 year old female with anemia of chronic disease.  PRIOR THERAPY:  #1 patient has received Aranesp injections every 4 weeks her last injection was given back in January 2013.  CURRENT THERAPY:Aranesp injection 300 mcg every 8 weeks  INTERVAL HISTORY: Megan Fischer 62 y.o. female returns for Followup visit today.  Her hemoglobin looks good again today at 11.5.  She is feeling well.  She denies fatigue, shortness of breath, palpitations or any other concerns.  A 10 point ROS is neg.   MEDICAL HISTORY: Past Medical History  Diagnosis Date  . Obesities, morbid   . Atrial fibrillation   . Hypertension   . Dyslipidemia   . Diabetes mellitus   . Osteoarthritis   . Chronic disease anemia 03/29/2011    ALLERGIES:  is allergic to codeine; oxycodone hcl er; and penicillins.  MEDICATIONS:  Current Outpatient Prescriptions  Medication Sig Dispense Refill  . BENAZEPRIL HCL PO Take by mouth daily.       Marland Kitchen CALCIUM PO Take by mouth 2 (two) times daily.        Marland Kitchen diltiazem (CARDIZEM CD) 240 MG 24 hr capsule Take 240 mg by mouth daily.        . ergocalciferol (VITAMIN D2) 50000 UNITS capsule Take 50,000 Units by mouth once a week.        Marland Kitchen glipiZIDE (GLUCOTROL) 5 MG tablet Take 5 mg by mouth daily.        . Liraglutide (VICTOZA) 18 MG/3ML SOLN Inject 1.2 mg into the skin daily.      . metFORMIN (GLUCOPHAGE) 500 MG tablet Take 500 mg by mouth 3 (three) times daily.        . metoprolol succinate (TOPROL-XL) 50 MG 24 hr tablet Take 1 tablet (50 mg total) by mouth daily.  90 tablet  0  . rosuvastatin (CRESTOR) 10 MG tablet Take 10 mg by mouth daily.        . Tamsulosin HCl (FLOMAX) 0.4 MG CAPS Take by mouth daily.        Marland Kitchen warfarin (COUMADIN) 5 MG tablet Take as directed by coumadin clinic  90 tablet  1   No current facility-administered medications for this visit.     SURGICAL HISTORY:  Past Surgical History  Procedure Laterality Date  . Cholecystectomy    . Tubal ligation    . Replacement total knee bilateral    . Shoulder surgery      RIGHT SHOULDER    REVIEW OF SYSTEMS:   General: fatigue (-), night sweats (-), fever (-), pain (-) Lymph: palpable nodes (-) HEENT: vision changes (-), mucositis (-), gum bleeding (-), epistaxis (-) Cardiovascular: chest pain (-), palpitations (-) Pulmonary: shortness of breath (-), dyspnea on exertion (-), cough (-), hemoptysis (-) GI:  Early satiety (-), melena (-), dysphagia (-), nausea/vomiting (-), diarrhea (-) GU: dysuria (-), hematuria (-), incontinence (-) Musculoskeletal: joint swelling (-), joint pain (-), back pain (-) Neuro: weakness (-), numbness (-), headache (-), confusion (-) Skin: Rash (-), lesions (-), dryness (-) Psych: depression (-), suicidal/homicidal ideation (-), feeling of hopelessness (-)   PHYSICAL EXAMINATION:  BP 120/77  Pulse 83  Temp(Src) 98.5 F (36.9 C) (Oral)  Resp 20  Ht 5\' 6"  (1.676 m)  Wt 246 lb (111.585 kg)  BMI 39.72 kg/m2 General: Patient is a well appearing female in no acute distress HEENT: PERRLA, sclerae anicteric no conjunctival  pallor, MMM, does have some thyromegaly Neck: supple, no palpable adenopathy Lungs: clear to auscultation bilaterally, no wheezes, rhonchi, or rales Cardiovascular: regular rate rhythm, S1, S2, no murmurs, rubs or gallops Abdomen: Soft, non-tender, non-distended, normoactive bowel sounds, no HSM Extremities: warm and well perfused, no clubbing, cyanosis, or edema Skin: No rashes or lesions Neuro: Non-focal ECOG PERFORMANCE STATUS: 1 - Symptomatic but completely ambulatory  LABORATORY DATA: Lab Results  Component Value Date   WBC 5.7 08/14/2012   HGB 11.5* 08/14/2012   HCT 35.2 08/14/2012   MCV 87.6 08/14/2012   PLT 248 08/14/2012      Chemistry      Component Value Date/Time   NA 141 07/24/2011 1516   K 3.9 07/24/2011 1516    CL 101 07/24/2011 1516   CO2 26 07/24/2011 1516   BUN 20 07/24/2011 1516   CREATININE 1.02 07/24/2011 1516      Component Value Date/Time   CALCIUM 9.5 07/24/2011 1516   ALKPHOS 77 09/29/2010 1404   AST 17 09/29/2010 1404   ALT 12 09/29/2010 1404   BILITOT 0.2* 09/29/2010 1404       RADIOGRAPHIC STUDIES:  No results found.  ASSESSMENT: 62 year old female with multiple medical problems including anemia of chronic disease. Patient has been on supportive therapy consisting of Aranesp injections 300 mg every 8 weeks. However she has responded very nicely and has not had and it injection since August 2013.   PLAN: I have discussed patient's blood counts with her today. She does not need Aranesp injection today. She will return in 8 weeks for CBC and Aranesp if needed.  We will see her back in 6 months.  I recommended she see her PCP regarding the swelling in her neck, she will f/u with them this week.   All questions were answered. The patient knows to call the clinic with any problems, questions or concerns. We can certainly see the patient much sooner if necessary.  I spent >15 minutes counseling the patient face to face. The total time spent in the appointment was 30 minutes.   Cherie Ouch Lyn Hollingshead, NP Medical Oncology [facility] Phone: [phone] 08/14/2012, 10:46 AM

## 2012-08-14 NOTE — Telephone Encounter (Signed)
appts made and printed 

## 2012-08-14 NOTE — Patient Instructions (Signed)
Doing well.  You do not need Aranesp today.  You will come back in 8 weeks for labs and an injection if needed.  We will see you back in 6 months.

## 2012-09-13 ENCOUNTER — Ambulatory Visit (INDEPENDENT_AMBULATORY_CARE_PROVIDER_SITE_OTHER): Payer: Medicare Other

## 2012-09-13 DIAGNOSIS — Z7901 Long term (current) use of anticoagulants: Secondary | ICD-10-CM

## 2012-09-13 DIAGNOSIS — I4891 Unspecified atrial fibrillation: Secondary | ICD-10-CM

## 2012-09-13 LAB — POCT INR: INR: 2.4

## 2012-10-09 ENCOUNTER — Ambulatory Visit: Payer: Medicare Other

## 2012-10-09 ENCOUNTER — Other Ambulatory Visit (HOSPITAL_BASED_OUTPATIENT_CLINIC_OR_DEPARTMENT_OTHER): Payer: Medicare Other | Admitting: Lab

## 2012-10-09 ENCOUNTER — Other Ambulatory Visit: Payer: Medicare Other | Admitting: Lab

## 2012-10-09 DIAGNOSIS — D638 Anemia in other chronic diseases classified elsewhere: Secondary | ICD-10-CM

## 2012-10-09 LAB — CBC WITH DIFFERENTIAL/PLATELET
BASO%: 0.1 % (ref 0.0–2.0)
Basophils Absolute: 0 10*3/uL (ref 0.0–0.1)
EOS%: 0 % (ref 0.0–7.0)
HGB: 12.5 g/dL (ref 11.6–15.9)
MCH: 27.4 pg (ref 25.1–34.0)
MCHC: 32.1 g/dL (ref 31.5–36.0)
RDW: 14.8 % — ABNORMAL HIGH (ref 11.2–14.5)
lymph#: 2.7 10*3/uL (ref 0.9–3.3)

## 2012-10-09 MED ORDER — DARBEPOETIN ALFA-POLYSORBATE 500 MCG/ML IJ SOLN: 300.0000 ug | Freq: Once | INTRAMUSCULAR | Status: DC

## 2012-10-25 ENCOUNTER — Ambulatory Visit (INDEPENDENT_AMBULATORY_CARE_PROVIDER_SITE_OTHER): Payer: Medicare Other

## 2012-10-25 DIAGNOSIS — Z7901 Long term (current) use of anticoagulants: Secondary | ICD-10-CM

## 2012-10-25 DIAGNOSIS — I4891 Unspecified atrial fibrillation: Secondary | ICD-10-CM

## 2012-10-25 LAB — POCT INR: INR: 2

## 2012-11-06 ENCOUNTER — Ambulatory Visit: Payer: Medicare Other

## 2012-11-06 ENCOUNTER — Other Ambulatory Visit: Payer: Medicare Other | Admitting: Lab

## 2012-11-06 ENCOUNTER — Other Ambulatory Visit (HOSPITAL_BASED_OUTPATIENT_CLINIC_OR_DEPARTMENT_OTHER): Payer: Medicare Other | Admitting: Lab

## 2012-11-06 DIAGNOSIS — D638 Anemia in other chronic diseases classified elsewhere: Secondary | ICD-10-CM

## 2012-11-06 LAB — CBC WITH DIFFERENTIAL/PLATELET
BASO%: 0.3 % (ref 0.0–2.0)
Eosinophils Absolute: 0 10*3/uL (ref 0.0–0.5)
HCT: 35.2 % (ref 34.8–46.6)
LYMPH%: 37.2 % (ref 14.0–49.7)
MCHC: 33.4 g/dL (ref 31.5–36.0)
MONO#: 0.4 10*3/uL (ref 0.1–0.9)
NEUT#: 3.9 10*3/uL (ref 1.5–6.5)
NEUT%: 56.4 % (ref 38.4–76.8)
Platelets: 264 10*3/uL (ref 145–400)
WBC: 6.8 10*3/uL (ref 3.9–10.3)
lymph#: 2.5 10*3/uL (ref 0.9–3.3)

## 2012-11-06 MED ORDER — DARBEPOETIN ALFA-POLYSORBATE 500 MCG/ML IJ SOLN: 300.0000 ug | Freq: Once | INTRAMUSCULAR | Status: DC

## 2012-12-06 ENCOUNTER — Ambulatory Visit (INDEPENDENT_AMBULATORY_CARE_PROVIDER_SITE_OTHER): Payer: Medicare Other | Admitting: *Deleted

## 2012-12-06 DIAGNOSIS — Z7901 Long term (current) use of anticoagulants: Secondary | ICD-10-CM

## 2012-12-06 DIAGNOSIS — I4891 Unspecified atrial fibrillation: Secondary | ICD-10-CM

## 2012-12-06 LAB — POCT INR: INR: 2.2

## 2012-12-11 ENCOUNTER — Other Ambulatory Visit: Payer: Medicare Other | Admitting: Lab

## 2012-12-11 ENCOUNTER — Ambulatory Visit: Payer: Medicare Other

## 2012-12-11 ENCOUNTER — Other Ambulatory Visit (HOSPITAL_BASED_OUTPATIENT_CLINIC_OR_DEPARTMENT_OTHER): Payer: Medicare Other | Admitting: Lab

## 2012-12-11 DIAGNOSIS — D638 Anemia in other chronic diseases classified elsewhere: Secondary | ICD-10-CM

## 2012-12-11 LAB — CBC WITH DIFFERENTIAL/PLATELET
Basophils Absolute: 0 10*3/uL (ref 0.0–0.1)
Eosinophils Absolute: 0 10*3/uL (ref 0.0–0.5)
HCT: 37.2 % (ref 34.8–46.6)
HGB: 11.7 g/dL (ref 11.6–15.9)
NEUT#: 2.9 10*3/uL (ref 1.5–6.5)
NEUT%: 50.6 % (ref 38.4–76.8)
RDW: 15 % — ABNORMAL HIGH (ref 11.2–14.5)
lymph#: 2.5 10*3/uL (ref 0.9–3.3)

## 2012-12-11 MED ORDER — DARBEPOETIN ALFA-POLYSORBATE 500 MCG/ML IJ SOLN: 300.0000 ug | Freq: Once | INTRAMUSCULAR | Status: DC

## 2013-01-07 ENCOUNTER — Other Ambulatory Visit: Payer: Self-pay | Admitting: *Deleted

## 2013-01-07 MED ORDER — LIRAGLUTIDE 18 MG/3ML ~~LOC~~ SOPN: 1.2000 mg | PEN_INJECTOR | Freq: Every day | SUBCUTANEOUS | Status: DC

## 2013-01-07 MED ORDER — GLIMEPIRIDE 2 MG PO TABS: 2.0000 mg | ORAL_TABLET | Freq: Every day | ORAL | Status: DC

## 2013-01-07 MED ORDER — METFORMIN HCL 500 MG PO TABS: 500.0000 mg | ORAL_TABLET | Freq: Three times a day (TID) | ORAL | Status: DC

## 2013-01-07 MED ORDER — METOPROLOL SUCCINATE ER 100 MG PO TB24: 100.0000 mg | ORAL_TABLET | Freq: Every day | ORAL | Status: DC

## 2013-01-07 MED ORDER — BENAZEPRIL-HYDROCHLOROTHIAZIDE 20-12.5 MG PO TABS: 1.0000 | ORAL_TABLET | Freq: Every day | ORAL | Status: DC

## 2013-01-08 ENCOUNTER — Ambulatory Visit: Payer: Medicare Other

## 2013-01-08 ENCOUNTER — Other Ambulatory Visit: Payer: Medicare Other | Admitting: Lab

## 2013-01-08 ENCOUNTER — Other Ambulatory Visit (HOSPITAL_BASED_OUTPATIENT_CLINIC_OR_DEPARTMENT_OTHER): Payer: Medicare Other | Admitting: Lab

## 2013-01-08 DIAGNOSIS — D638 Anemia in other chronic diseases classified elsewhere: Secondary | ICD-10-CM

## 2013-01-08 LAB — CBC WITH DIFFERENTIAL/PLATELET
BASO%: 0.2 % (ref 0.0–2.0)
Basophils Absolute: 0 10*3/uL (ref 0.0–0.1)
HCT: 34.5 % — ABNORMAL LOW (ref 34.8–46.6)
HGB: 11.4 g/dL — ABNORMAL LOW (ref 11.6–15.9)
MONO#: 0.5 10*3/uL (ref 0.1–0.9)
NEUT%: 51.3 % (ref 38.4–76.8)
WBC: 5.7 10*3/uL (ref 3.9–10.3)
lymph#: 2.3 10*3/uL (ref 0.9–3.3)

## 2013-01-08 MED ORDER — DARBEPOETIN ALFA-POLYSORBATE 500 MCG/ML IJ SOLN: 300.0000 ug | Freq: Once | INTRAMUSCULAR | Status: DC

## 2013-01-13 ENCOUNTER — Telehealth: Payer: Self-pay | Admitting: Endocrinology

## 2013-01-13 MED ORDER — METFORMIN HCL ER 500 MG PO TB24: 500.0000 mg | ORAL_TABLET | Freq: Three times a day (TID) | ORAL | Status: DC

## 2013-01-13 NOTE — Telephone Encounter (Signed)
rx sent, benazepril-hctz is from a different manufacturer which is why the pills look different

## 2013-01-15 ENCOUNTER — Ambulatory Visit: Payer: Medicare Other | Admitting: Endocrinology

## 2013-01-17 ENCOUNTER — Ambulatory Visit (INDEPENDENT_AMBULATORY_CARE_PROVIDER_SITE_OTHER): Payer: Medicare Other

## 2013-01-17 DIAGNOSIS — I4891 Unspecified atrial fibrillation: Secondary | ICD-10-CM

## 2013-01-17 DIAGNOSIS — Z7901 Long term (current) use of anticoagulants: Secondary | ICD-10-CM

## 2013-01-20 ENCOUNTER — Ambulatory Visit (INDEPENDENT_AMBULATORY_CARE_PROVIDER_SITE_OTHER): Payer: Medicare Other | Admitting: Endocrinology

## 2013-01-20 ENCOUNTER — Other Ambulatory Visit: Payer: Self-pay | Admitting: *Deleted

## 2013-01-20 ENCOUNTER — Telehealth: Payer: Self-pay | Admitting: *Deleted

## 2013-01-20 ENCOUNTER — Encounter: Payer: Self-pay | Admitting: Endocrinology

## 2013-01-20 VITALS — BP 122/80 | HR 88 | Temp 98.5°F | Resp 12 | Ht 63.0 in | Wt 247.2 lb

## 2013-01-20 DIAGNOSIS — I10 Essential (primary) hypertension: Secondary | ICD-10-CM

## 2013-01-20 DIAGNOSIS — E042 Nontoxic multinodular goiter: Secondary | ICD-10-CM | POA: Insufficient documentation

## 2013-01-20 DIAGNOSIS — IMO0001 Reserved for inherently not codable concepts without codable children: Secondary | ICD-10-CM

## 2013-01-20 DIAGNOSIS — E785 Hyperlipidemia, unspecified: Secondary | ICD-10-CM

## 2013-01-20 LAB — LIPID PANEL
Cholesterol: 164 mg/dL (ref 0–200)
LDL Cholesterol: 79 mg/dL (ref 0–99)
Total CHOL/HDL Ratio: 2
Triglycerides: 71 mg/dL (ref 0.0–149.0)
VLDL: 14.2 mg/dL (ref 0.0–40.0)

## 2013-01-20 LAB — COMPREHENSIVE METABOLIC PANEL
ALT: 19 U/L (ref 0–35)
Albumin: 4 g/dL (ref 3.5–5.2)
CO2: 28 mEq/L (ref 19–32)
GFR: 60.9 mL/min (ref >60.00)
Glucose, Bld: 96 mg/dL (ref 70–99)
Potassium: 3.9 mEq/L (ref 3.5–5.1)
Sodium: 140 mEq/L (ref 135–145)
Total Protein: 8.2 g/dL (ref 6.0–8.3)

## 2013-01-20 LAB — MICROALBUMIN / CREATININE URINE RATIO: Microalb, Ur: 3.1 mg/dL — ABNORMAL HIGH (ref 0.0–1.9)

## 2013-01-20 LAB — T4, FREE: Free T4: 1.17 ng/dL (ref 0.60–1.60)

## 2013-01-20 MED ORDER — DILTIAZEM HCL ER COATED BEADS 240 MG PO CP24: 240.0000 mg | ORAL_CAPSULE | Freq: Every day | ORAL | Status: DC

## 2013-01-20 NOTE — Progress Notes (Signed)
Patient ID: Megan Fischer, female   DOB: [birth year], 62 y.o.   MRN: [id number]  Megan Fischer is an 62 y.o. female.   Reason for Appointment: Diabetes follow-up   History of Present Illness   Diagnosis: Type 2 DIABETES MELITUS, date of diagnosis:  2000     Previous history: She has been on various regimens of treatment in the past including Byetta and generally has done better with GLP-1 drugs She continues to take low-dose glimepiride which helps with overall control. Usually blood sugars are not variable  Recent history: She has been on Victoza since at least 2013 with good control and no side effects. Her blood sugars are excellent with overall average 102. She has only rare hypoglycemia especially when she is eating late. However has difficulty losing weight     Oral hypoglycemic drugs: Metformin and Amaryl        Side effects from medications: None       Monitors blood glucose: Once a day.    Glucometer:  contour      Blood Glucose readings from meter download: readings before breakfast:  75-127, nonfasting 59-193 with only rare blood sugar over 130 or below 70 Hypoglycemia frequency:  1-2x per month, will feel sweaty; may occur any time    Meals: 3 meals per day.          Physical activity: exercise: Unable to walk because of leg pain           Dietician visit: Most recent: 2005 The last HbgA1c was reported as 6.9 in 3/14  Wt Readings from Last 3 Encounters:  01/20/13 247 lb 3.2 oz (112.129 kg)  08/14/12 246 lb (111.585 kg)  01/15/12 250 lb (113.399 kg)      Medication List       This list is accurate as of: 01/20/13  9:57 AM.  Always use your most recent med list.               BENAZEPRIL HCL PO  Take by mouth daily.     benazepril-hydrochlorthiazide 20-12.5 MG per tablet  Commonly known as:  LOTENSIN HCT  Take 1 tablet by mouth daily.     CALCIUM PO  Take by mouth 2 (two) times daily.     diltiazem 240 MG 24 hr capsule  Commonly known as:  CARDIZEM CD   Take 240 mg by mouth daily.     ergocalciferol 50000 UNITS capsule  Commonly known as:  VITAMIN D2  Take 50,000 Units by mouth once a week.     FLOMAX 0.4 MG Caps capsule  Generic drug:  tamsulosin  Take by mouth daily.     glimepiride 2 MG tablet  Commonly known as:  AMARYL  Take 1 tablet (2 mg total) by mouth daily before breakfast.     guanFACINE 2 MG tablet  Commonly known as:  TENEX  2 mg.     Liraglutide 18 MG/3ML Sopn  Commonly known as:  VICTOZA  Inject 1.2 mg into the skin daily.     metFORMIN 500 MG tablet  Commonly known as:  GLUCOPHAGE  Take 1 tablet (500 mg total) by mouth 3 (three) times daily.     metFORMIN 500 MG 24 hr tablet  Commonly known as:  GLUCOPHAGE XR  Take 1 tablet (500 mg total) by mouth 3 (three) times daily.     metoprolol succinate 100 MG 24 hr tablet  Commonly known as:  TOPROL-XL  Take 1 tablet (100 mg  total) by mouth daily. Take with or immediately following a meal.     rosuvastatin 10 MG tablet  Commonly known as:  CRESTOR  Take 10 mg by mouth daily.     warfarin 5 MG tablet  Commonly known as:  COUMADIN  Take as directed by coumadin clinic        Allergies:  Allergies  Allergen Reactions  . Codeine   . Oxycodone Hcl Er   . Penicillins     Past Medical History  Diagnosis Date  . Obesities, morbid   . Atrial fibrillation   . Hypertension   . Dyslipidemia   . Diabetes mellitus   . Osteoarthritis   . Chronic disease anemia 03/29/2011    Past Surgical History  Procedure Laterality Date  . Cholecystectomy    . Tubal ligation    . Replacement total knee bilateral    . Shoulder surgery      RIGHT SHOULDER    Family History  Problem Relation Age of Onset  . Heart attack Mother   . Stroke Mother   . Diabetes Mother   . Hypertension Brother     Social History:  reports that she has never smoked. She does not have any smokeless tobacco history on file. She reports that she does not drink alcohol or use illicit  drugs.  Review of Systems:  Hypertension:  she has required multiple medications and blood pressure looks excellent. However she complains that her current prescription of diltiazem is causing side effects Also asking about  Reliability of her generic blood pressure medicines and metformin because of the different color and shape  Lipids: Generally well controlled with Crestor, no side effects      Examination:   BP 122/80  Pulse 88  Temp(Src) 98.5 F (36.9 C)  Resp 12  Ht 5\' 3"  (1.6 m)  Wt 247 lb 3.2 oz (112.129 kg)  BMI 43.8 kg/m2  SpO2 97%  Body mass index is 43.8 kg/(m^2).   She has a thyroid enlargement which is about 2-1/2-3 times normal on the right side and nearly twice normal on the left side, soft to firm and somewhat nodular. No tremor Heart rate is regular, no abnormal heart sounds Normal biceps reflexes No pedal edema  ASSESSMENT/ PLAN::   Diabetes type 2   Blood glucose control is fairly good and since she has occasional hypoglycemia well reduce her Amaryl to 1 mg Encouraged her to be consistent with diet for weight loss She will call if she has any further hyperglycemia and also check more readings after supper  HYPERTENSION: Well controlled. She will go back to the 240 mg Cardizem CD, not  clear why she is having nonspecific side effects from the 300 mg  Long-standing multinodular goiter: She has had suppressed TSH in the past, will recheck thyroid functions  Atrial fibrillation: Her rate is well controlled, to followup with cardiologist again  Addendum: Labs:  Office Visit on 01/20/2013  Component Date Value Range Status  . Sodium 01/20/2013 140  135 - 145 mEq/L Final  . Potassium 01/20/2013 3.9  3.5 - 5.1 mEq/L Final  . Chloride 01/20/2013 105  96 - 112 mEq/L Final  . CO2 01/20/2013 28  19 - 32 mEq/L Final  . Glucose, Bld 01/20/2013 96  70 - 99 mg/dL Final  . BUN 95/28/4132 23  6 - 23 mg/dL Final  . Creatinine, Ser 01/20/2013 1.2  0.4 - 1.2 mg/dL  Final  . Total Bilirubin 01/20/2013 0.4  0.3 -  1.2 mg/dL Final  . Alkaline Phosphatase 01/20/2013 67  39 - 117 U/L Final  . AST 01/20/2013 25  0 - 37 U/L Final  . ALT 01/20/2013 19  0 - 35 U/L Final  . Total Protein 01/20/2013 8.2  6.0 - 8.3 g/dL Final  . Albumin 16/02/9603 4.0  3.5 - 5.2 g/dL Final  . Calcium 54/01/8118 9.9  8.4 - 10.5 mg/dL Final  . GFR 14/78/2956 60.90  >60.00 mL/min Final  . Microalb, Ur 01/20/2013 3.1* 0.0 - 1.9 mg/dL Final  . Creatinine,U 21/30/8657 224.8   Final  . Microalb Creat Ratio 01/20/2013 1.4  0.0 - 30.0 mg/g Final  . Cholesterol 01/20/2013 164  0 - 200 mg/dL Final   ATP III Classification       Desirable:  < 200 mg/dL               Borderline High:  200 - 239 mg/dL          High:  > = 846 mg/dL  . Triglycerides 01/20/2013 71.0  0.0 - 149.0 mg/dL Final   Normal:  <962 mg/dLBorderline High:  150 - 199 mg/dL  . HDL 01/20/2013 71.00  >39.00 mg/dL Final  . VLDL 95/28/4132 14.2  0.0 - 40.0 mg/dL Final  . LDL Cholesterol 01/20/2013 79  0 - 99 mg/dL Final  . Total CHOL/HDL Ratio 01/20/2013 2   Final                  Men          Women1/2 Average Risk     3.4          3.3Average Risk          5.0          4.42X Average Risk          9.6          7.13X Average Risk          15.0          11.0                      . Hemoglobin A1C 01/20/2013 6.7* 4.6 - 6.5 % Final   Glycemic Control Guidelines for People with Diabetes:Non Diabetic:  <6%Goal of Therapy: <7%Additional Action Suggested:  >8%   . Free T4 01/20/2013 1.17  0.60 - 1.60 ng/dL Final  . TSH 44/05/270 0.31* 0.35 - 5.50 uIU/mL Final  Anti-coag visit on 01/17/2013  Component Date Value Range Status  . INR 01/17/2013 2.2   Final     Laverne Klugh 01/20/2013, 9:57 AM

## 2013-01-20 NOTE — Telephone Encounter (Signed)
rx sent

## 2013-01-20 NOTE — Patient Instructions (Addendum)
Reduce Glimeperide 2mg  to 1/2 tab daily  Change Diltiazem CD to 240mg  daily

## 2013-02-03 ENCOUNTER — Other Ambulatory Visit: Payer: Self-pay | Admitting: Endocrinology

## 2013-02-05 ENCOUNTER — Other Ambulatory Visit: Payer: Medicare Other | Admitting: Lab

## 2013-02-05 ENCOUNTER — Ambulatory Visit: Payer: Medicare Other

## 2013-02-13 ENCOUNTER — Ambulatory Visit: Payer: Medicare Other | Admitting: Adult Health

## 2013-02-13 ENCOUNTER — Ambulatory Visit: Payer: Medicare Other

## 2013-02-13 ENCOUNTER — Encounter (INDEPENDENT_AMBULATORY_CARE_PROVIDER_SITE_OTHER): Payer: Self-pay

## 2013-02-13 ENCOUNTER — Ambulatory Visit (HOSPITAL_BASED_OUTPATIENT_CLINIC_OR_DEPARTMENT_OTHER): Payer: Medicare Other | Admitting: Adult Health

## 2013-02-13 ENCOUNTER — Telehealth: Payer: Self-pay | Admitting: Oncology

## 2013-02-13 ENCOUNTER — Encounter: Payer: Self-pay | Admitting: Adult Health

## 2013-02-13 ENCOUNTER — Other Ambulatory Visit: Payer: Medicare Other | Admitting: Lab

## 2013-02-13 ENCOUNTER — Other Ambulatory Visit (HOSPITAL_BASED_OUTPATIENT_CLINIC_OR_DEPARTMENT_OTHER): Payer: Medicare Other | Admitting: Lab

## 2013-02-13 VITALS — BP 115/71 | HR 88 | Temp 97.6°F | Resp 20 | Ht 63.0 in | Wt 254.4 lb

## 2013-02-13 DIAGNOSIS — Z7901 Long term (current) use of anticoagulants: Secondary | ICD-10-CM

## 2013-02-13 DIAGNOSIS — D638 Anemia in other chronic diseases classified elsewhere: Secondary | ICD-10-CM

## 2013-02-13 DIAGNOSIS — I4891 Unspecified atrial fibrillation: Secondary | ICD-10-CM

## 2013-02-13 LAB — CBC WITH DIFFERENTIAL/PLATELET
Basophils Absolute: 0 10*3/uL (ref 0.0–0.1)
Eosinophils Absolute: 0 10*3/uL (ref 0.0–0.5)
HGB: 11.8 g/dL (ref 11.6–15.9)
LYMPH%: 18.6 % (ref 14.0–49.7)
MCV: 86.6 fL (ref 79.5–101.0)
MONO#: 0.6 10*3/uL (ref 0.1–0.9)
MONO%: 5.7 % (ref 0.0–14.0)
NEUT#: 8.1 10*3/uL — ABNORMAL HIGH (ref 1.5–6.5)
Platelets: 239 10*3/uL (ref 145–400)
RBC: 4.18 10*6/uL (ref 3.70–5.45)
RDW: 15 % — ABNORMAL HIGH (ref 11.2–14.5)
WBC: 10.7 10*3/uL — ABNORMAL HIGH (ref 3.9–10.3)
nRBC: 1 % — ABNORMAL HIGH (ref 0–0)

## 2013-02-13 NOTE — Progress Notes (Signed)
OFFICE PROGRESS NOTE  CC  Dorrene German, MD [address] [place] [address] Dr. Reather Littler  DIAGNOSIS: 62 year old female with anemia of chronic disease.  PRIOR THERAPY:  #1 patient has received Aranesp injections every 4-8 weeks her last injection was given back in January 2013.  CURRENT THERAPY:Aranesp injection 300 mcg every 8 weeks   INTERVAL HISTORY: Megan Fischer 62 y.o. female returns for Followup visit today.  Her hemoglobin looks good again today at 11.5.  She is feeling well.  She denies fatigue, shortness of breath, palpitations or any other concerns.  A 10 point ROS is neg.   MEDICAL HISTORY: Past Medical History  Diagnosis Date  . Obesities, morbid   . Atrial fibrillation   . Hypertension   . Dyslipidemia   . Diabetes mellitus   . Osteoarthritis   . Chronic disease anemia 03/29/2011    ALLERGIES:  is allergic to codeine; oxycodone hcl; and penicillins.  MEDICATIONS:  Current Outpatient Prescriptions  Medication Sig Dispense Refill  . BENAZEPRIL HCL PO Take by mouth daily.       . benazepril-hydrochlorthiazide (LOTENSIN HCT) 20-12.5 MG per tablet Take 1 tablet by mouth daily.  90 tablet  0  . CALCIUM PO Take by mouth 2 (two) times daily.        Marland Kitchen diltiazem (CARDIZEM CD) 240 MG 24 hr capsule Take 1 capsule (240 mg total) by mouth daily.  90 capsule  1  . ergocalciferol (VITAMIN D2) 50000 UNITS capsule Take 50,000 Units by mouth once a week.        Marland Kitchen glimepiride (AMARYL) 2 MG tablet Take 1 tablet (2 mg total) by mouth daily before breakfast.  90 tablet  0  . guanFACINE (TENEX) 2 MG tablet 2 mg.      . metFORMIN (GLUCOPHAGE XR) 500 MG 24 hr tablet Take 1 tablet (500 mg total) by mouth 3 (three) times daily.  270 tablet  1  . metFORMIN (GLUCOPHAGE) 500 MG tablet Take 1 tablet (500 mg total) by mouth 3 (three) times daily.  270 tablet  0  . metoprolol succinate (TOPROL-XL) 100 MG 24 hr tablet Take 1 tablet (100 mg total) by mouth daily. Take with or  immediately following a meal.  90 tablet  0  . rosuvastatin (CRESTOR) 10 MG tablet Take 10 mg by mouth daily.        . Tamsulosin HCl (FLOMAX) 0.4 MG CAPS Take by mouth daily.        Marland Kitchen VICTOZA 18 MG/3ML SOPN INJECT 1.2 MG INTO THE SKIN DAILY  6 pen  1  . warfarin (COUMADIN) 5 MG tablet Take as directed by coumadin clinic  90 tablet  1   No current facility-administered medications for this visit.    SURGICAL HISTORY:  Past Surgical History  Procedure Laterality Date  . Cholecystectomy    . Tubal ligation    . Replacement total knee bilateral    . Shoulder surgery      RIGHT SHOULDER    REVIEW OF SYSTEMS:   A 10 point review of systems was conducted and is otherwise negative except for what is noted above.      PHYSICAL EXAMINATION:  BP 115/71  Pulse 88  Temp(Src) 97.6 F (36.4 C) (Oral)  Resp 20  Ht 5\' 3"  (1.6 m)  Wt 254 lb 6.4 oz (115.395 kg)  BMI 45.08 kg/m2 General: Patient is a well appearing female in no acute distress HEENT: PERRLA, sclerae anicteric no conjunctival pallor, MMM,  does have some thyromegaly Neck: supple, no palpable adenopathy Lungs: clear to auscultation bilaterally, no wheezes, rhonchi, or rales Cardiovascular: regular rate rhythm, S1, S2, no murmurs, rubs or gallops Abdomen: Soft, non-tender, non-distended, normoactive bowel sounds, no HSM Extremities: warm and well perfused, no clubbing, cyanosis, or edema Skin: No rashes or lesions Neuro: Non-focal ECOG PERFORMANCE STATUS: 1 - Symptomatic but completely ambulatory  LABORATORY DATA: Lab Results  Component Value Date   WBC 10.7* 02/13/2013   HGB 11.8 02/13/2013   HCT 36.2 02/13/2013   MCV 86.6 02/13/2013   PLT 239 02/13/2013      Chemistry      Component Value Date/Time   NA 140 01/20/2013 1014   K 3.9 01/20/2013 1014   CL 105 01/20/2013 1014   CO2 28 01/20/2013 1014   BUN 23 01/20/2013 1014   CREATININE 1.2 01/20/2013 1014      Component Value Date/Time   CALCIUM 9.9 01/20/2013 1014    ALKPHOS 67 01/20/2013 1014   AST 25 01/20/2013 1014   ALT 19 01/20/2013 1014   BILITOT 0.4 01/20/2013 1014       RADIOGRAPHIC STUDIES:  No results found.  ASSESSMENT: 62 year old female with multiple medical problems including anemia of chronic disease. Patient has been on supportive therapy consisting of Aranesp injections 300 mg every 8 weeks. However she has responded very nicely and has not had and it injection since August 2013.   PLAN: I have discussed patient's blood counts with her today. She does not need Aranesp injection today. She will return in 8 weeks for CBC and Aranesp if needed.  We will see her back in 6 months.  She will see Dr. Welton Flakes, and we may be able to decrease the frequency of her visits from 8 weeks to 12 or 16 at Dr. Milta Deiters discretion.    All questions were answered. The patient knows to call the clinic with any problems, questions or concerns. We can certainly see the patient much sooner if necessary.  I spent >15 minutes counseling the patient face to face. The total time spent in the appointment was 30 minutes.   Cherie Ouch Lyn Hollingshead, NP Medical Oncology [facility] Phone: [phone] 02/13/2013, 10:52 AM

## 2013-02-13 NOTE — Telephone Encounter (Signed)
, °

## 2013-02-13 NOTE — Patient Instructions (Signed)
Doing well.  Your labs are stable.  You do not require any Aranesp today.  Please call us if you have any questions or concerns.

## 2013-02-28 ENCOUNTER — Ambulatory Visit (INDEPENDENT_AMBULATORY_CARE_PROVIDER_SITE_OTHER): Payer: Medicare Other | Admitting: *Deleted

## 2013-02-28 DIAGNOSIS — I4891 Unspecified atrial fibrillation: Secondary | ICD-10-CM

## 2013-03-12 ENCOUNTER — Other Ambulatory Visit: Payer: Medicare Other | Admitting: Lab

## 2013-03-12 ENCOUNTER — Ambulatory Visit: Payer: Medicare Other

## 2013-03-28 ENCOUNTER — Ambulatory Visit (INDEPENDENT_AMBULATORY_CARE_PROVIDER_SITE_OTHER): Payer: Medicare Other | Admitting: Pharmacist

## 2013-03-28 DIAGNOSIS — I4891 Unspecified atrial fibrillation: Secondary | ICD-10-CM

## 2013-03-28 DIAGNOSIS — Z7901 Long term (current) use of anticoagulants: Secondary | ICD-10-CM

## 2013-03-28 LAB — POCT INR: INR: 2.7

## 2013-04-07 ENCOUNTER — Other Ambulatory Visit: Payer: Self-pay | Admitting: Endocrinology

## 2013-04-10 ENCOUNTER — Other Ambulatory Visit (HOSPITAL_BASED_OUTPATIENT_CLINIC_OR_DEPARTMENT_OTHER): Payer: Medicare Other | Admitting: Lab

## 2013-04-10 ENCOUNTER — Ambulatory Visit: Payer: Medicare Other

## 2013-04-10 DIAGNOSIS — D638 Anemia in other chronic diseases classified elsewhere: Secondary | ICD-10-CM

## 2013-04-10 LAB — CBC WITH DIFFERENTIAL/PLATELET
BASO%: 0.4 % (ref 0.0–2.0)
EOS%: 0 % (ref 0.0–7.0)
Eosinophils Absolute: 0 10*3/uL (ref 0.0–0.5)
HCT: 33.4 % — ABNORMAL LOW (ref 34.8–46.6)
HGB: 11 g/dL — ABNORMAL LOW (ref 11.6–15.9)
MCHC: 32.8 g/dL (ref 31.5–36.0)
MONO#: 0.4 10*3/uL (ref 0.1–0.9)
NEUT#: 3.3 10*3/uL (ref 1.5–6.5)
NEUT%: 53.5 % (ref 38.4–76.8)
Platelets: 286 10*3/uL (ref 145–400)
WBC: 6.1 10*3/uL (ref 3.9–10.3)
lymph#: 2.4 10*3/uL (ref 0.9–3.3)

## 2013-04-10 NOTE — Progress Notes (Signed)
HGB 11.0 today.  Aranesp not given today as per perimeters.

## 2013-04-14 ENCOUNTER — Other Ambulatory Visit: Payer: Self-pay | Admitting: Endocrinology

## 2013-04-14 MED ORDER — ROSUVASTATIN CALCIUM 10 MG PO TABS: 10.0000 mg | ORAL_TABLET | Freq: Every day | ORAL | Status: DC

## 2013-04-14 MED ORDER — GUANFACINE HCL 2 MG PO TABS: 2.0000 mg | ORAL_TABLET | Freq: Every day | ORAL | Status: DC

## 2013-04-17 ENCOUNTER — Other Ambulatory Visit: Payer: Medicare Other

## 2013-04-21 ENCOUNTER — Ambulatory Visit: Payer: Medicare Other | Admitting: Endocrinology

## 2013-05-12 ENCOUNTER — Ambulatory Visit (INDEPENDENT_AMBULATORY_CARE_PROVIDER_SITE_OTHER): Payer: Medicare HMO

## 2013-05-12 DIAGNOSIS — Z7901 Long term (current) use of anticoagulants: Secondary | ICD-10-CM

## 2013-05-12 DIAGNOSIS — I4891 Unspecified atrial fibrillation: Secondary | ICD-10-CM

## 2013-05-12 LAB — POCT INR: INR: 3.2

## 2013-06-05 ENCOUNTER — Ambulatory Visit: Payer: Medicare Other

## 2013-06-05 ENCOUNTER — Telehealth: Payer: Self-pay | Admitting: *Deleted

## 2013-06-05 ENCOUNTER — Other Ambulatory Visit: Payer: Medicare Other

## 2013-06-05 NOTE — Telephone Encounter (Signed)
Called patient at home about missed appointment.  No answer machine to leave a message

## 2013-06-09 ENCOUNTER — Ambulatory Visit (INDEPENDENT_AMBULATORY_CARE_PROVIDER_SITE_OTHER): Payer: Medicare HMO | Admitting: *Deleted

## 2013-06-09 DIAGNOSIS — Z7901 Long term (current) use of anticoagulants: Secondary | ICD-10-CM

## 2013-06-09 DIAGNOSIS — Z0000 Encounter for general adult medical examination without abnormal findings: Secondary | ICD-10-CM | POA: Insufficient documentation

## 2013-06-09 DIAGNOSIS — Z5181 Encounter for therapeutic drug level monitoring: Secondary | ICD-10-CM | POA: Insufficient documentation

## 2013-06-09 DIAGNOSIS — I4891 Unspecified atrial fibrillation: Secondary | ICD-10-CM

## 2013-06-09 LAB — POCT INR: INR: 2.7

## 2013-06-19 ENCOUNTER — Other Ambulatory Visit: Payer: Self-pay | Admitting: Endocrinology

## 2013-06-19 ENCOUNTER — Ambulatory Visit (INDEPENDENT_AMBULATORY_CARE_PROVIDER_SITE_OTHER): Payer: Medicare HMO | Admitting: Endocrinology

## 2013-06-19 VITALS — BP 128/76 | HR 82 | Temp 97.6°F | Resp 16 | Ht 63.0 in | Wt 248.2 lb

## 2013-06-19 DIAGNOSIS — E042 Nontoxic multinodular goiter: Secondary | ICD-10-CM

## 2013-06-19 DIAGNOSIS — E1165 Type 2 diabetes mellitus with hyperglycemia: Principal | ICD-10-CM

## 2013-06-19 DIAGNOSIS — IMO0001 Reserved for inherently not codable concepts without codable children: Secondary | ICD-10-CM

## 2013-06-19 DIAGNOSIS — E785 Hyperlipidemia, unspecified: Secondary | ICD-10-CM

## 2013-06-19 DIAGNOSIS — I10 Essential (primary) hypertension: Secondary | ICD-10-CM

## 2013-06-19 LAB — HEMOGLOBIN A1C
Hgb A1c MFr Bld: 6.6 % — ABNORMAL HIGH (ref <5.7)
Mean Plasma Glucose: 143 mg/dL — ABNORMAL HIGH (ref <117)

## 2013-06-19 LAB — BASIC METABOLIC PANEL
BUN: 26 mg/dL — AB (ref 6–23)
CO2: 28 meq/L (ref 19–32)
CREATININE: 1.14 mg/dL — AB (ref 0.50–1.10)
Calcium: 9.9 mg/dL (ref 8.4–10.5)
Chloride: 104 mEq/L (ref 96–112)
Glucose, Bld: 106 mg/dL — ABNORMAL HIGH (ref 70–99)
Potassium: 4.4 mEq/L (ref 3.5–5.3)
Sodium: 141 mEq/L (ref 135–145)

## 2013-06-19 NOTE — Patient Instructions (Signed)
Stop Glimeperide and stay on Victoza 1.2

## 2013-06-19 NOTE — Progress Notes (Signed)
Patient ID: Megan Fischer, female   DOB: Oct 14, 1950, 63 y.o.   MRN: [id number]   Reason for Appointment: Diabetes follow-up   History of Present Illness   Diagnosis: Type 2 DIABETES MELITUS, date of diagnosis:  2000     Previous history: She has been on various regimens of treatment in the past including Byetta and generally has done better with GLP-1 drugs She continues to take low-dose glimepiride which helps with overall control. Usually blood sugars are not variable  Recent history: She has been on Victoza since at least 2013 with good control and no side effects. Her blood sugars are excellent at home She has had occasional hypoglycemia especially when she is eating late in the evenings. This is despite reducing her Amaryl to half tablet on the last visit Also has lost a little weight since October     Oral hypoglycemic drugs: Metformin and Amaryl        Side effects from medications: None Monitors blood glucose: Once a day.    Glucometer:  contour      Blood Glucose readings from meter download: Morning : 71-144, nonfasting 66-197 with only sporadic high readings in the afternoon or evening and overall average 114       Hypoglycemia: Has 2 lower readings  Around 8 PM in the 60s     Meals: 2-3 meals per day.          Physical activity: exercise: Unable to walk because of leg pain           Dietician visit: Most recent: 2005   Wt Readings from Last 3 Encounters:  06/19/13 248 lb 3.2 oz (112.583 kg)  02/13/13 254 lb 6.4 oz (115.395 kg)  01/20/13 247 lb 3.2 oz (112.129 kg)   Lab Results  Component Value Date   HGBA1C 6.6* 06/19/2013   HGBA1C 6.7* 01/20/2013   HGBA1C  Value: 6.6 (NOTE)   The ADA recommends the following therapeutic goals for glycemic   control related to Hgb A1C measurement:   Goal of Therapy:   < 7.0% Hgb A1C   Action Suggested:  > 8.0% Hgb A1C   Ref:  Diabetes Care, 22, Suppl. 1, 1999* 10/19/2006   Lab Results  Component Value Date   MICROALBUR 3.1* 01/20/2013    LDLCALC 79 01/20/2013   CREATININE 1.14* 06/19/2013      Medication List       This list is accurate as of: 06/19/13 11:59 PM.  Always use your most recent med list.               benazepril-hydrochlorthiazide 20-12.5 MG per tablet  Commonly known as:  LOTENSIN HCT  Take 1 tablet by mouth daily.     CALCIUM PO  Take by mouth 2 (two) times daily.     diltiazem 240 MG 24 hr capsule  Commonly known as:  CARDIZEM CD  Take 1 capsule (240 mg total) by mouth daily.     ergocalciferol 50000 UNITS capsule  Commonly known as:  VITAMIN D2  Take 50,000 Units by mouth once a week.     glimepiride 2 MG tablet  Commonly known as:  AMARYL  TAKE ONE TABLET BY MOUTH ONCE DAILY BEFORE BREAKFAST     guanFACINE 2 MG tablet  Commonly known as:  TENEX  Take 1 tablet (2 mg total) by mouth at bedtime.     metFORMIN 500 MG tablet  Commonly known as:  GLUCOPHAGE  Take 1 tablet (500 mg total)  by mouth 3 (three) times daily.     metFORMIN 500 MG 24 hr tablet  Commonly known as:  GLUCOPHAGE XR  Take 1 tablet (500 mg total) by mouth 3 (three) times daily.     metoprolol succinate 100 MG 24 hr tablet  Commonly known as:  TOPROL-XL  Take 1 tablet (100 mg total) by mouth daily. Take with or immediately following a meal.     rosuvastatin 10 MG tablet  Commonly known as:  CRESTOR  Take 1 tablet (10 mg total) by mouth daily.     VICTOZA 18 MG/3ML Sopn  Generic drug:  Liraglutide  INJECT 1.2MG  INTO THE SKIN DAILY     warfarin 5 MG tablet  Commonly known as:  COUMADIN  Take as directed by coumadin clinic        Allergies:  Allergies  Allergen Reactions  . Codeine   . Oxycodone Hcl   . Penicillins     Past Medical History  Diagnosis Date  . Obesities, morbid   . Atrial fibrillation   . Hypertension   . Dyslipidemia   . Diabetes mellitus   . Osteoarthritis   . Chronic disease anemia 03/29/2011    Past Surgical History  Procedure Laterality Date  . Cholecystectomy    .  Tubal ligation    . Replacement total knee bilateral    . Shoulder surgery      RIGHT SHOULDER    Family History  Problem Relation Age of Onset  . Heart attack Mother   . Stroke Mother   . Diabetes Mother   . Hypertension Brother     Social History:  reports that she has never smoked. She does not have any smokeless tobacco history on file. She reports that she does not drink alcohol or use illicit drugs.  Review of Systems:  History of atrial fibrillation: She is still on Coumadin and followed by cardiologist  Hypertension:  she has required multiple medications and blood pressure is now excellent.  Lipids: Generally well controlled with Crestor, no side effects    History of goiter, long-standing which is euthyroid  Lab Results  Component Value Date   TSH 0.31* 01/20/2013     Examination:   BP 128/76  Pulse 82  Temp(Src) 97.6 F (36.4 C)  Resp 16  Ht 5\' 3"  (1.6 m)  Wt 248 lb 3.2 oz (112.583 kg)  BMI 43.98 kg/m2  SpO2 97%  Body mass index is 43.98 kg/(m^2).   No pedal edema  ASSESSMENT/ PLAN::   Diabetes type 2   Blood glucose control is fairly good and since she has occasional hypoglycemia will stop her Amaryl which is only 1 mg currently  She will call if she has any  hyperglycemia Continue Victoza  HYPERTENSION: Well controlled.   Long-standing multinodular goiter: She has had suppressed TSH in the past, will recheck thyroid functions on the next visit  Atrial fibrillation: Her rate is  controlled, to followup with cardiologist  regularly    [facility], [facility] CampusKUMAR,Majesty Oehlert 06/22/2013, 11:24 AM

## 2013-07-03 ENCOUNTER — Other Ambulatory Visit: Payer: Self-pay | Admitting: Endocrinology

## 2013-07-04 ENCOUNTER — Other Ambulatory Visit: Payer: Self-pay | Admitting: *Deleted

## 2013-07-04 MED ORDER — GLUCOSE BLOOD VI STRP: ORAL_STRIP | Status: DC

## 2013-07-04 MED ORDER — INSULIN PEN NEEDLE 31G X 5 MM MISC: Status: DC

## 2013-07-07 ENCOUNTER — Ambulatory Visit (INDEPENDENT_AMBULATORY_CARE_PROVIDER_SITE_OTHER): Payer: Medicare HMO

## 2013-07-07 DIAGNOSIS — Z5181 Encounter for therapeutic drug level monitoring: Secondary | ICD-10-CM

## 2013-07-07 DIAGNOSIS — I4891 Unspecified atrial fibrillation: Secondary | ICD-10-CM

## 2013-07-07 LAB — POCT INR: INR: 2

## 2013-07-09 ENCOUNTER — Other Ambulatory Visit: Payer: Self-pay | Admitting: *Deleted

## 2013-07-09 MED ORDER — DILTIAZEM HCL ER COATED BEADS 240 MG PO CP24: 240.0000 mg | ORAL_CAPSULE | Freq: Every day | ORAL | Status: DC

## 2013-07-10 ENCOUNTER — Telehealth: Payer: Self-pay | Admitting: Endocrinology

## 2013-07-10 NOTE — Telephone Encounter (Signed)
Pt's guanfacine will stop being covered by her insurance what can she do for a alternate?

## 2013-07-10 NOTE — Telephone Encounter (Signed)
Medication tenex will stop being covered by insurance wanted to know what an alternative would be. Please advise, Thanks!

## 2013-07-11 ENCOUNTER — Other Ambulatory Visit: Payer: Self-pay | Admitting: *Deleted

## 2013-07-11 MED ORDER — CLONIDINE HCL 0.1 MG PO TABS: 0.1000 mg | ORAL_TABLET | Freq: Two times a day (BID) | ORAL | Status: DC

## 2013-07-11 NOTE — Telephone Encounter (Signed)
Change to clonidine 0.1 mg twice a day

## 2013-07-31 ENCOUNTER — Ambulatory Visit: Payer: Medicare HMO

## 2013-07-31 ENCOUNTER — Other Ambulatory Visit (HOSPITAL_BASED_OUTPATIENT_CLINIC_OR_DEPARTMENT_OTHER): Payer: Medicare HMO

## 2013-07-31 DIAGNOSIS — D638 Anemia in other chronic diseases classified elsewhere: Secondary | ICD-10-CM

## 2013-07-31 LAB — CBC WITH DIFFERENTIAL/PLATELET
BASO%: 0.1 % (ref 0.0–2.0)
Basophils Absolute: 0 10*3/uL (ref 0.0–0.1)
EOS ABS: 0 10*3/uL (ref 0.0–0.5)
EOS%: 0 % (ref 0.0–7.0)
HCT: 36 % (ref 34.8–46.6)
HGB: 11.6 g/dL (ref 11.6–15.9)
LYMPH#: 2.6 10*3/uL (ref 0.9–3.3)
LYMPH%: 37.6 % (ref 14.0–49.7)
MCH: 28.3 pg (ref 25.1–34.0)
MCHC: 32.2 g/dL (ref 31.5–36.0)
MCV: 87.8 fL (ref 79.5–101.0)
MONO#: 0.4 10*3/uL (ref 0.1–0.9)
MONO%: 6.2 % (ref 0.0–14.0)
NEUT#: 3.9 10*3/uL (ref 1.5–6.5)
NEUT%: 56.1 % (ref 38.4–76.8)
Platelets: 244 10*3/uL (ref 145–400)
RBC: 4.1 10*6/uL (ref 3.70–5.45)
RDW: 14.5 % (ref 11.2–14.5)
WBC: 6.9 10*3/uL (ref 3.9–10.3)

## 2013-07-31 NOTE — Progress Notes (Signed)
HGB 11.6 today.  Does not need an Aranesp injection today.

## 2013-08-04 ENCOUNTER — Ambulatory Visit (INDEPENDENT_AMBULATORY_CARE_PROVIDER_SITE_OTHER): Payer: Medicare HMO

## 2013-08-04 DIAGNOSIS — I4891 Unspecified atrial fibrillation: Secondary | ICD-10-CM

## 2013-08-04 DIAGNOSIS — Z5181 Encounter for therapeutic drug level monitoring: Secondary | ICD-10-CM

## 2013-08-04 LAB — POCT INR: INR: 1.9

## 2013-08-27 ENCOUNTER — Other Ambulatory Visit: Payer: Self-pay | Admitting: *Deleted

## 2013-08-27 MED ORDER — LIRAGLUTIDE 18 MG/3ML ~~LOC~~ SOPN: PEN_INJECTOR | SUBCUTANEOUS | Status: DC

## 2013-09-01 ENCOUNTER — Other Ambulatory Visit: Payer: Self-pay | Admitting: *Deleted

## 2013-09-01 ENCOUNTER — Ambulatory Visit (INDEPENDENT_AMBULATORY_CARE_PROVIDER_SITE_OTHER): Payer: Medicare HMO

## 2013-09-01 DIAGNOSIS — Z5181 Encounter for therapeutic drug level monitoring: Secondary | ICD-10-CM

## 2013-09-01 DIAGNOSIS — I4891 Unspecified atrial fibrillation: Secondary | ICD-10-CM

## 2013-09-01 LAB — POCT INR: INR: 2.1

## 2013-09-01 MED ORDER — METFORMIN HCL ER 500 MG PO TB24: 500.0000 mg | ORAL_TABLET | Freq: Three times a day (TID) | ORAL | Status: DC

## 2013-09-01 MED ORDER — LIRAGLUTIDE 18 MG/3ML ~~LOC~~ SOPN: PEN_INJECTOR | SUBCUTANEOUS | Status: DC

## 2013-09-01 MED ORDER — GUANFACINE HCL 2 MG PO TABS: 2.0000 mg | ORAL_TABLET | Freq: Every day | ORAL | Status: DC

## 2013-09-01 MED ORDER — METOPROLOL SUCCINATE ER 100 MG PO TB24: 100.0000 mg | ORAL_TABLET | Freq: Every day | ORAL | Status: DC

## 2013-09-01 MED ORDER — GLUCOSE BLOOD VI STRP: ORAL_STRIP | Status: DC

## 2013-09-01 MED ORDER — BENAZEPRIL-HYDROCHLOROTHIAZIDE 20-12.5 MG PO TABS: ORAL_TABLET | ORAL | Status: DC

## 2013-09-01 MED ORDER — DILTIAZEM HCL ER COATED BEADS 240 MG PO CP24: 240.0000 mg | ORAL_CAPSULE | Freq: Every day | ORAL | Status: DC

## 2013-09-01 MED ORDER — ROSUVASTATIN CALCIUM 10 MG PO TABS: 10.0000 mg | ORAL_TABLET | Freq: Every day | ORAL | Status: DC

## 2013-09-01 MED ORDER — WARFARIN SODIUM 5 MG PO TABS: ORAL_TABLET | ORAL | Status: DC

## 2013-09-01 MED ORDER — CLONIDINE HCL 0.1 MG PO TABS: 0.1000 mg | ORAL_TABLET | Freq: Two times a day (BID) | ORAL | Status: DC

## 2013-09-01 MED ORDER — INSULIN PEN NEEDLE 31G X 5 MM MISC: Status: DC

## 2013-09-03 ENCOUNTER — Other Ambulatory Visit: Payer: Self-pay | Admitting: Endocrinology

## 2013-09-03 ENCOUNTER — Other Ambulatory Visit: Payer: Self-pay | Admitting: *Deleted

## 2013-09-12 ENCOUNTER — Other Ambulatory Visit: Payer: Self-pay | Admitting: *Deleted

## 2013-09-12 ENCOUNTER — Telehealth: Payer: Self-pay | Admitting: *Deleted

## 2013-09-12 MED ORDER — ONETOUCH DELICA LANCETS FINE MISC: Status: DC

## 2013-09-12 MED ORDER — ONETOUCH VERIO IQ SYSTEM W/DEVICE KIT: PACK | Status: DC

## 2013-09-12 MED ORDER — GLUCOSE BLOOD VI STRP: ORAL_STRIP | Status: DC

## 2013-09-12 MED ORDER — GLIMEPIRIDE 2 MG PO TABS: 2.0000 mg | ORAL_TABLET | Freq: Every day | ORAL | Status: DC

## 2013-09-16 ENCOUNTER — Other Ambulatory Visit (INDEPENDENT_AMBULATORY_CARE_PROVIDER_SITE_OTHER): Payer: Medicare HMO

## 2013-09-16 DIAGNOSIS — E785 Hyperlipidemia, unspecified: Secondary | ICD-10-CM

## 2013-09-16 DIAGNOSIS — E1165 Type 2 diabetes mellitus with hyperglycemia: Principal | ICD-10-CM

## 2013-09-16 DIAGNOSIS — IMO0001 Reserved for inherently not codable concepts without codable children: Secondary | ICD-10-CM

## 2013-09-16 DIAGNOSIS — E042 Nontoxic multinodular goiter: Secondary | ICD-10-CM

## 2013-09-16 LAB — LIPID PANEL
CHOLESTEROL: 169 mg/dL (ref 0–200)
HDL: 71.9 mg/dL (ref >39.00)
LDL CALC: 83 mg/dL (ref 0–99)
TRIGLYCERIDES: 70 mg/dL (ref 0.0–149.0)
Total CHOL/HDL Ratio: 2
VLDL: 14 mg/dL (ref 0.0–40.0)

## 2013-09-16 LAB — COMPREHENSIVE METABOLIC PANEL
ALT: 20 U/L (ref 0–35)
AST: 25 U/L (ref 0–37)
Albumin: 4.3 g/dL (ref 3.5–5.2)
Alkaline Phosphatase: 72 U/L (ref 39–117)
BUN: 38 mg/dL — AB (ref 6–23)
CALCIUM: 10.1 mg/dL (ref 8.4–10.5)
CHLORIDE: 106 meq/L (ref 96–112)
CO2: 25 meq/L (ref 19–32)
CREATININE: 1.3 mg/dL — AB (ref 0.4–1.2)
GFR: 54.74 mL/min — AB (ref >60.00)
GLUCOSE: 101 mg/dL — AB (ref 70–99)
Potassium: 4.2 mEq/L (ref 3.5–5.1)
Sodium: 140 mEq/L (ref 135–145)
Total Bilirubin: 0.5 mg/dL (ref 0.2–1.2)
Total Protein: 8.1 g/dL (ref 6.0–8.3)

## 2013-09-16 LAB — HEMOGLOBIN A1C: Hgb A1c MFr Bld: 7 % — ABNORMAL HIGH (ref 4.6–6.5)

## 2013-09-16 LAB — T4, FREE: Free T4: 0.97 ng/dL (ref 0.60–1.60)

## 2013-09-16 LAB — MICROALBUMIN / CREATININE URINE RATIO
Creatinine,U: 153.7 mg/dL
Microalb Creat Ratio: 1.8 mg/g (ref 0.0–30.0)
Microalb, Ur: 2.7 mg/dL — ABNORMAL HIGH (ref 0.0–1.9)

## 2013-09-16 LAB — TSH: TSH: 0.3 u[IU]/mL — ABNORMAL LOW (ref 0.35–4.50)

## 2013-09-19 ENCOUNTER — Ambulatory Visit (INDEPENDENT_AMBULATORY_CARE_PROVIDER_SITE_OTHER): Payer: Medicare HMO | Admitting: Endocrinology

## 2013-09-19 ENCOUNTER — Encounter: Payer: Self-pay | Admitting: Endocrinology

## 2013-09-19 VITALS — BP 124/78 | HR 82 | Temp 98.3°F | Resp 16 | Ht 63.0 in | Wt 252.8 lb

## 2013-09-19 DIAGNOSIS — I10 Essential (primary) hypertension: Secondary | ICD-10-CM

## 2013-09-19 DIAGNOSIS — IMO0001 Reserved for inherently not codable concepts without codable children: Secondary | ICD-10-CM

## 2013-09-19 DIAGNOSIS — E785 Hyperlipidemia, unspecified: Secondary | ICD-10-CM

## 2013-09-19 DIAGNOSIS — E1165 Type 2 diabetes mellitus with hyperglycemia: Principal | ICD-10-CM

## 2013-09-19 MED ORDER — CLONIDINE HCL 0.1 MG/24HR TD PTWK: 0.1000 mg | MEDICATED_PATCH | TRANSDERMAL | Status: DC

## 2013-09-19 NOTE — Progress Notes (Signed)
Patient ID: Megan Fischer, female   DOB: Aug 23, 1950, 63 y.o.   MRN: [id number]   Reason for Appointment: Diabetes follow-up   History of Present Illness   Diagnosis: Type 2 DIABETES MELITUS, date of diagnosis:  2000     Previous history: She has been on various regimens of treatment in the past including Byetta and generally has done better with GLP-1 drugs She continues to take low-dose glimepiride which helps with overall control. Usually blood sugars are not variable  Recent history: She has been on Victoza since at least 2013 with good control Because of occasional hypoglycemia her Amaryl was stopped in 2/15 She continues to take metformin ER, 3 tablets a day She thinks she is getting diarrhea from Victoza since she gets significantly loose stools about an hour after the injection in the morning However has not had any side effects from this before Her blood sugars are excellent at home despite stopping Victoza and gaining 4 pounds Checking blood sugars at various times of the day and has only one high reading   Oral hypoglycemic drugs: Metformin and Amaryl        Side effects from medications: None Monitors blood glucose: Once a day.    Glucometer:  contour      Blood Glucose readings from meter download:   PREMEAL Breakfast Lunch Dinner Bedtime Overall  Glucose range:  71-158   109   89-106   78-195    Mean/median:  102     130   112         Hypoglycemia: None    Meals: 2-3 meals per day.          Physical activity: exercise: Unable to walk because of leg pain           Dietician visit: Most recent: 2005   Wt Readings from Last 3 Encounters:  09/19/13 252 lb 12.8 oz (114.669 kg)  06/19/13 248 lb 3.2 oz (112.583 kg)  02/13/13 254 lb 6.4 oz (115.395 kg)   Lab Results  Component Value Date   HGBA1C 7.0* 09/16/2013   HGBA1C 6.6* 06/19/2013   HGBA1C 6.7* 01/20/2013   Lab Results  Component Value Date   MICROALBUR 2.7* 09/16/2013   LDLCALC 83 09/16/2013   CREATININE 1.3*  09/16/2013      Medication List       This list is accurate as of: 09/19/13 10:00 AM.  Always use your most recent med list.               benazepril-hydrochlorthiazide 20-12.5 MG per tablet  Commonly known as:  LOTENSIN HCT  TAKE ONE TABLET BY MOUTH ONCE DAILY     CALCIUM PO  Take by mouth 2 (two) times daily.     cloNIDine 0.1 MG tablet  Commonly known as:  CATAPRES  Take 1 tablet (0.1 mg total) by mouth 2 (two) times daily.     diltiazem 240 MG 24 hr capsule  Commonly known as:  CARDIZEM CD  Take 1 capsule (240 mg total) by mouth daily.     ergocalciferol 50000 UNITS capsule  Commonly known as:  VITAMIN D2  Take 50,000 Units by mouth once a week.     glimepiride 2 MG tablet  Commonly known as:  AMARYL  Take 1 tablet (2 mg total) by mouth daily with breakfast.     glucose blood test strip  Commonly known as:  ONETOUCH VERIO  Use as instructed to check blood sugar 2 times per  day dx code 250.02     guanFACINE 2 MG tablet  Commonly known as:  TENEX  Take 1 tablet (2 mg total) by mouth at bedtime.     Insulin Pen Needle 31G X 5 MM Misc  Use as directed 3 times a day     Liraglutide 18 MG/3ML Sopn  Commonly known as:  VICTOZA  Inject 1.2 mg daily     metFORMIN 500 MG 24 hr tablet  Commonly known as:  GLUCOPHAGE XR  Take 1 tablet (500 mg total) by mouth 3 (three) times daily.     metoprolol succinate 100 MG 24 hr tablet  Commonly known as:  TOPROL-XL  Take 1 tablet (100 mg total) by mouth daily. Take with or immediately following a meal.     ONETOUCH DELICA LANCETS FINE Misc  Use to obtain a blood specimen 2 times per day dx code 250.02     ONETOUCH VERIO IQ SYSTEM W/DEVICE Kit  Use to check blood sugar 2 times per day dx code 250.02     rosuvastatin 10 MG tablet  Commonly known as:  CRESTOR  Take 1 tablet (10 mg total) by mouth daily.     warfarin 5 MG tablet  Commonly known as:  COUMADIN  Take as directed by coumadin clinic        Allergies:   Allergies  Allergen Reactions  . Codeine   . Oxycodone Hcl   . Penicillins     Past Medical History  Diagnosis Date  . Obesities, morbid   . Atrial fibrillation   . Hypertension   . Dyslipidemia   . Diabetes mellitus   . Osteoarthritis   . Chronic disease anemia 03/29/2011    Past Surgical History  Procedure Laterality Date  . Cholecystectomy    . Tubal ligation    . Replacement total knee bilateral    . Shoulder surgery      RIGHT SHOULDER    Family History  Problem Relation Age of Onset  . Heart attack Mother   . Stroke Mother   . Diabetes Mother   . Hypertension Brother     Social History:  reports that she has never smoked. She does not have any smokeless tobacco history on file. She reports that she does not drink alcohol or use illicit drugs.  Review of Systems:  History of atrial fibrillation: She is still on Coumadin and followed by cardiologist  Hypertension:  she has required multiple medications and blood pressure is now excellent. She was told by her insurance that Tenex will not be covered and she will have to take clonidine but she is getting nausea with this  Lipids: Generally well controlled with Crestor, no side effects  Lab Results  Component Value Date   CHOL 169 09/16/2013   HDL 71.90 09/16/2013   LDLCALC 83 09/16/2013   TRIG 70.0 09/16/2013   CHOLHDL 2 09/16/2013    History of goiter, long-standing which is euthyroid  Lab Results  Component Value Date   TSH 0.30* 09/16/2013     Examination:   BP 124/78  Pulse 82  Temp(Src) 98.3 F (36.8 C)  Resp 16  Ht 5' 3"  (1.6 m)  Wt 252 lb 12.8 oz (114.669 kg)  BMI 44.79 kg/m2  SpO2 94%  Body mass index is 44.79 kg/(m^2).    ASSESSMENT/ PLAN:   Diabetes type 2   Blood glucose control is fairly good and she is taking Victoza and metformin now  Does not think she  has diarrhea from Victoza as she had taken this for 2 years without any side effects May have diarrhea from metformin  ER.  She can try taking the Victoza in the evening instead of morning May need to reduce her metformin if diarrhea continues  HYPERTENSION: Well controlled. However since she is having nausea will change her clonidine to Catapres patch   Long-standing multinodular goiter: She has had suppressed TSH without hyperthyroidism and this is stable    Elayne Snare 09/19/2013, 10:00 AM

## 2013-09-19 NOTE — Patient Instructions (Addendum)
Change Victoza to 0.6mg  for 1 week and take at night; then go up to 1.2mg   Change clonidine to patch

## 2013-09-25 ENCOUNTER — Ambulatory Visit: Payer: Medicare HMO

## 2013-09-25 ENCOUNTER — Other Ambulatory Visit: Payer: Self-pay | Admitting: *Deleted

## 2013-09-25 ENCOUNTER — Encounter: Payer: Self-pay | Admitting: Adult Health

## 2013-09-25 ENCOUNTER — Ambulatory Visit (HOSPITAL_BASED_OUTPATIENT_CLINIC_OR_DEPARTMENT_OTHER): Payer: Medicare HMO

## 2013-09-25 ENCOUNTER — Telehealth: Payer: Self-pay | Admitting: Oncology

## 2013-09-25 ENCOUNTER — Ambulatory Visit (HOSPITAL_BASED_OUTPATIENT_CLINIC_OR_DEPARTMENT_OTHER): Payer: Medicare HMO | Admitting: Adult Health

## 2013-09-25 ENCOUNTER — Other Ambulatory Visit: Payer: Medicare HMO

## 2013-09-25 VITALS — BP 143/62 | HR 99 | Temp 99.6°F | Resp 18 | Ht 63.0 in | Wt 253.6 lb

## 2013-09-25 DIAGNOSIS — D638 Anemia in other chronic diseases classified elsewhere: Secondary | ICD-10-CM

## 2013-09-25 LAB — CBC & DIFF AND RETIC
BASO%: 0.1 % (ref 0.0–2.0)
BASOS ABS: 0 10*3/uL (ref 0.0–0.1)
EOS ABS: 0 10*3/uL (ref 0.0–0.5)
EOS%: 0.1 % (ref 0.0–7.0)
HCT: 36.8 % (ref 34.8–46.6)
HEMOGLOBIN: 11.9 g/dL (ref 11.6–15.9)
Immature Retic Fract: 5.6 % (ref 1.60–10.00)
LYMPH%: 38.5 % (ref 14.0–49.7)
MCH: 28.3 pg (ref 25.1–34.0)
MCHC: 32.3 g/dL (ref 31.5–36.0)
MCV: 87.6 fL (ref 79.5–101.0)
MONO#: 0.4 10*3/uL (ref 0.1–0.9)
MONO%: 5.8 % (ref 0.0–14.0)
NEUT%: 55.5 % (ref 38.4–76.8)
NEUTROS ABS: 3.7 10*3/uL (ref 1.5–6.5)
PLATELETS: 290 10*3/uL (ref 145–400)
RBC: 4.2 10*6/uL (ref 3.70–5.45)
RDW: 15.8 % — ABNORMAL HIGH (ref 11.2–14.5)
Retic %: 1.57 % (ref 0.70–2.10)
Retic Ct Abs: 65.94 10*3/uL (ref 33.70–90.70)
WBC: 6.7 10*3/uL (ref 3.9–10.3)
lymph#: 2.6 10*3/uL (ref 0.9–3.3)

## 2013-09-25 LAB — IRON AND TIBC CHCC
%SAT: 19 % — AB (ref 21–57)
Iron: 64 ug/dL (ref 41–142)
TIBC: 330 ug/dL (ref 236–444)
UIBC: 266 ug/dL (ref 120–384)

## 2013-09-25 LAB — FERRITIN CHCC: FERRITIN: 88 ng/mL (ref 9–269)

## 2013-09-25 NOTE — Telephone Encounter (Signed)
per pof to sch appt-send pt to lab today-gave pt copy of sch

## 2013-09-25 NOTE — Patient Instructions (Signed)
You are doing well.  We will see you back in 6 months for labs only and in one year for labs and an office visit.  Please call us if you have any questions or concerns.

## 2013-09-25 NOTE — Progress Notes (Signed)
OFFICE PROGRESS NOTE  CC  Philis Fendt, MD [address] [place] [address] Dr. Elayne Snare  DIAGNOSIS: 63 year old female with anemia of chronic disease.  PRIOR THERAPY:  #1 patient has received Aranesp injections every 4-8 weeks her last injection was given back in January 2013.  CURRENT THERAPY:Aranesp if needed  INTERVAL HISTORY: Megan Fischer 63 y.o. female returns for Followup visit today.  She has been being evaluated every 8 weeks for labs and a possible aranesp injection.  She has not required an injection since January 2013.  She is doing well today.  She denies fatigue, chest pain, palpitations, shortness of breath, easy bruising/bleeding, or any further concerns today.    MEDICAL HISTORY: Past Medical History  Diagnosis Date  . Obesities, morbid   . Atrial fibrillation   . Hypertension   . Dyslipidemia   . Diabetes mellitus   . Osteoarthritis   . Chronic disease anemia 03/29/2011    ALLERGIES:  is allergic to codeine; oxycodone hcl; and penicillins.  MEDICATIONS:  Current Outpatient Prescriptions  Medication Sig Dispense Refill  . benazepril-hydrochlorthiazide (LOTENSIN HCT) 20-12.5 MG per tablet TAKE ONE TABLET BY MOUTH ONCE DAILY  90 tablet  0  . Blood Glucose Monitoring Suppl (ONETOUCH VERIO IQ SYSTEM) W/DEVICE KIT Use to check blood sugar 2 times per day dx code 250.02  1 kit  0  . CALCIUM PO Take by mouth 2 (two) times daily.        Marland Kitchen diltiazem (CARDIZEM CD) 240 MG 24 hr capsule Take 1 capsule (240 mg total) by mouth daily.  90 capsule  0  . ergocalciferol (VITAMIN D2) 50000 UNITS capsule Take 50,000 Units by mouth once a week.        Marland Kitchen glimepiride (AMARYL) 2 MG tablet Take 1 tablet (2 mg total) by mouth daily with breakfast.  90 tablet  1  . glucose blood (ONETOUCH VERIO) test strip Use as instructed to check blood sugar 2 times per day dx code 250.02  100 each  3  . Insulin Pen Needle 31G X 5 MM MISC Use as directed 3 times a day  300 each  0   . Liraglutide (VICTOZA) 18 MG/3ML SOPN Inject 1.2 mg daily  6 pen  1  . metFORMIN (GLUCOPHAGE XR) 500 MG 24 hr tablet Take 1 tablet (500 mg total) by mouth 3 (three) times daily.  270 tablet  0  . metoprolol succinate (TOPROL-XL) 100 MG 24 hr tablet Take 1 tablet (100 mg total) by mouth daily. Take with or immediately following a meal.  90 tablet  0  . ONETOUCH DELICA LANCETS FINE MISC Use to obtain a blood specimen 2 times per day dx code 250.02  100 each  3  . rosuvastatin (CRESTOR) 10 MG tablet Take 1 tablet (10 mg total) by mouth daily.  90 tablet  0  . warfarin (COUMADIN) 5 MG tablet Take as directed by coumadin clinic  90 tablet  1  . cloNIDine (CATAPRES-TTS-1) 0.1 mg/24hr patch Place 1 patch (0.1 mg total) onto the skin once a week.  4 patch  12   No current facility-administered medications for this visit.    SURGICAL HISTORY:  Past Surgical History  Procedure Laterality Date  . Cholecystectomy    . Tubal ligation    . Replacement total knee bilateral    . Shoulder surgery      RIGHT SHOULDER    REVIEW OF SYSTEMS:   A 10 point review of systems was  conducted and is otherwise negative except for what is noted above.      PHYSICAL EXAMINATION:  BP 143/62  Pulse 99  Temp(Src) 99.6 F (37.6 C) (Oral)  Resp 18  Ht 5' 3"  (1.6 m)  Wt 253 lb 9.6 oz (115.032 kg)  BMI 44.93 kg/m2  SpO2 99% General: Patient is a well appearing female in no acute distress HEENT: PERRLA, sclerae anicteric no conjunctival pallor, MMM, does have some thyromegaly Neck: supple, no palpable adenopathy Lungs: clear to auscultation bilaterally, no wheezes, rhonchi, or rales Cardiovascular: regular rate rhythm, S1, S2, no murmurs, rubs or gallops Abdomen: Soft, non-tender, non-distended, normoactive bowel sounds, no HSM Extremities: warm and well perfused, no clubbing, cyanosis, or edema Skin: No rashes or lesions Neuro: Non-focal ECOG PERFORMANCE STATUS: 1 - Symptomatic but completely  ambulatory  LABORATORY DATA: Lab Results  Component Value Date   WBC 6.7 09/25/2013   HGB 11.9 09/25/2013   HCT 36.8 09/25/2013   MCV 87.6 09/25/2013   PLT 290 09/25/2013      Chemistry      Component Value Date/Time   NA 140 09/16/2013 0824   K 4.2 09/16/2013 0824   CL 106 09/16/2013 0824   CO2 25 09/16/2013 0824   BUN 38* 09/16/2013 0824   CREATININE 1.3* 09/16/2013 0824   CREATININE 1.14* 06/19/2013 1202      Component Value Date/Time   CALCIUM 10.1 09/16/2013 0824   ALKPHOS 72 09/16/2013 0824   AST 25 09/16/2013 0824   ALT 20 09/16/2013 0824   BILITOT 0.5 09/16/2013 0824       RADIOGRAPHIC STUDIES:  No results found.  ASSESSMENT: 63 year old female with multiple medical problems including anemia of chronic disease. Patient has been on supportive therapy consisting of Aranesp injections 300 mg every 8 weeks. However she has responded very nicely and has not had and it injection since August 2013.   PLAN:  The patient has done quite well without needing Aranesp injections since August 2013.  She has no symptoms of anemia.  She will have a CBC and iron studies today, return in 6 months for CBC and iron studies, and we will see her back in 1 year for labs, and a follow up appointment.  She will continue to follow up with her PCP, and should she need any Aranesp in the interim we are happy to arrange.  The patient is in agreement with this plan.    All questions were answered. The patient knows to call the clinic with any problems, questions or concerns. We can certainly see the patient much sooner if necessary.  I spent >15 minutes counseling the patient face to face. The total time spent in the appointment was 30 minutes.   Minette Headland, [place] [phone]   09/27/2013, 7:43 AM

## 2013-10-06 ENCOUNTER — Ambulatory Visit (INDEPENDENT_AMBULATORY_CARE_PROVIDER_SITE_OTHER): Payer: Medicare HMO | Admitting: *Deleted

## 2013-10-06 DIAGNOSIS — I4891 Unspecified atrial fibrillation: Secondary | ICD-10-CM

## 2013-10-06 DIAGNOSIS — Z5181 Encounter for therapeutic drug level monitoring: Secondary | ICD-10-CM

## 2013-10-06 LAB — POCT INR: INR: 1.8

## 2013-10-15 ENCOUNTER — Other Ambulatory Visit: Payer: Self-pay | Admitting: Internal Medicine

## 2013-10-15 DIAGNOSIS — Z1231 Encounter for screening mammogram for malignant neoplasm of breast: Secondary | ICD-10-CM

## 2013-10-15 DIAGNOSIS — E039 Hypothyroidism, unspecified: Secondary | ICD-10-CM

## 2013-10-17 ENCOUNTER — Other Ambulatory Visit: Payer: Medicare HMO

## 2013-10-20 ENCOUNTER — Ambulatory Visit
Admission: RE | Admit: 2013-10-20 | Discharge: 2013-10-20 | Disposition: A | Discharge status: Home / Self Care | Payer: Medicare HMO | Source: Ambulatory Visit | Attending: Internal Medicine | Admitting: Internal Medicine

## 2013-10-20 DIAGNOSIS — E039 Hypothyroidism, unspecified: Secondary | ICD-10-CM

## 2013-10-22 ENCOUNTER — Ambulatory Visit (INDEPENDENT_AMBULATORY_CARE_PROVIDER_SITE_OTHER): Payer: Medicare HMO | Admitting: *Deleted

## 2013-10-22 DIAGNOSIS — I4891 Unspecified atrial fibrillation: Secondary | ICD-10-CM

## 2013-10-22 DIAGNOSIS — Z5181 Encounter for therapeutic drug level monitoring: Secondary | ICD-10-CM

## 2013-10-22 LAB — POCT INR: INR: 2.4

## 2013-10-24 ENCOUNTER — Ambulatory Visit
Admission: RE | Admit: 2013-10-24 | Discharge: 2013-10-24 | Disposition: A | Discharge status: Home / Self Care | Payer: Medicare HMO | Source: Ambulatory Visit | Attending: Internal Medicine | Admitting: Internal Medicine

## 2013-10-24 DIAGNOSIS — Z1231 Encounter for screening mammogram for malignant neoplasm of breast: Secondary | ICD-10-CM

## 2013-10-27 ENCOUNTER — Other Ambulatory Visit: Payer: Self-pay | Admitting: Internal Medicine

## 2013-10-27 DIAGNOSIS — E041 Nontoxic single thyroid nodule: Secondary | ICD-10-CM

## 2013-11-13 ENCOUNTER — Ambulatory Visit
Admission: RE | Admit: 2013-11-13 | Discharge: 2013-11-13 | Disposition: A | Discharge status: Home / Self Care | Payer: Medicare HMO | Source: Ambulatory Visit | Attending: Internal Medicine | Admitting: Internal Medicine

## 2013-11-13 ENCOUNTER — Ambulatory Visit (INDEPENDENT_AMBULATORY_CARE_PROVIDER_SITE_OTHER): Payer: Medicare HMO | Admitting: Pharmacist

## 2013-11-13 ENCOUNTER — Other Ambulatory Visit (HOSPITAL_COMMUNITY)
Admission: RE | Admit: 2013-11-13 | Discharge: 2013-11-13 | Disposition: A | Discharge status: Home / Self Care | Payer: Medicare HMO | Source: Ambulatory Visit | Attending: Interventional Radiology | Admitting: Interventional Radiology

## 2013-11-13 DIAGNOSIS — E039 Hypothyroidism, unspecified: Secondary | ICD-10-CM | POA: Diagnosis not present

## 2013-11-13 DIAGNOSIS — Z5181 Encounter for therapeutic drug level monitoring: Secondary | ICD-10-CM

## 2013-11-13 DIAGNOSIS — E041 Nontoxic single thyroid nodule: Secondary | ICD-10-CM | POA: Insufficient documentation

## 2013-11-13 DIAGNOSIS — I4891 Unspecified atrial fibrillation: Secondary | ICD-10-CM

## 2013-11-13 LAB — POCT INR: INR: 1

## 2013-11-26 ENCOUNTER — Ambulatory Visit (INDEPENDENT_AMBULATORY_CARE_PROVIDER_SITE_OTHER): Payer: Medicare HMO | Admitting: *Deleted

## 2013-11-26 DIAGNOSIS — Z5181 Encounter for therapeutic drug level monitoring: Secondary | ICD-10-CM

## 2013-11-26 DIAGNOSIS — I4891 Unspecified atrial fibrillation: Secondary | ICD-10-CM

## 2013-11-26 LAB — POCT INR: INR: 1.9

## 2013-12-09 ENCOUNTER — Other Ambulatory Visit: Payer: Self-pay | Admitting: *Deleted

## 2013-12-09 ENCOUNTER — Telehealth: Payer: Self-pay | Admitting: Endocrinology

## 2013-12-09 MED ORDER — GLUCOSE BLOOD VI STRP: ORAL_STRIP | Status: DC

## 2013-12-09 MED ORDER — ONETOUCH DELICA LANCETS FINE MISC: Status: DC

## 2013-12-09 MED ORDER — METOPROLOL SUCCINATE ER 100 MG PO TB24: 100.0000 mg | ORAL_TABLET | Freq: Every day | ORAL | Status: DC

## 2013-12-09 MED ORDER — ROSUVASTATIN CALCIUM 10 MG PO TABS: 10.0000 mg | ORAL_TABLET | Freq: Every day | ORAL | Status: DC

## 2013-12-09 MED ORDER — BENAZEPRIL-HYDROCHLOROTHIAZIDE 20-12.5 MG PO TABS: ORAL_TABLET | ORAL | Status: DC

## 2013-12-09 MED ORDER — LIRAGLUTIDE 18 MG/3ML ~~LOC~~ SOPN: PEN_INJECTOR | SUBCUTANEOUS | Status: DC

## 2013-12-09 MED ORDER — GLIMEPIRIDE 2 MG PO TABS: 2.0000 mg | ORAL_TABLET | Freq: Every day | ORAL | Status: DC

## 2013-12-09 MED ORDER — METFORMIN HCL ER 500 MG PO TB24: 500.0000 mg | ORAL_TABLET | Freq: Three times a day (TID) | ORAL | Status: DC

## 2013-12-09 MED ORDER — DILTIAZEM HCL ER COATED BEADS 240 MG PO CP24: 240.0000 mg | ORAL_CAPSULE | Freq: Every day | ORAL | Status: DC

## 2013-12-09 MED ORDER — CLONIDINE HCL 0.1 MG/24HR TD PTWK: 0.1000 mg | MEDICATED_PATCH | TRANSDERMAL | Status: DC

## 2013-12-09 NOTE — Telephone Encounter (Signed)
90 day supply is needed for all of the meds Dr. Lucianne MussKumar rx please call [facility]

## 2013-12-10 ENCOUNTER — Ambulatory Visit (INDEPENDENT_AMBULATORY_CARE_PROVIDER_SITE_OTHER): Payer: Medicare HMO | Admitting: *Deleted

## 2013-12-10 DIAGNOSIS — Z5181 Encounter for therapeutic drug level monitoring: Secondary | ICD-10-CM

## 2013-12-10 DIAGNOSIS — I4891 Unspecified atrial fibrillation: Secondary | ICD-10-CM

## 2013-12-10 LAB — POCT INR: INR: 2.4

## 2013-12-15 ENCOUNTER — Other Ambulatory Visit: Payer: Medicare HMO

## 2013-12-18 ENCOUNTER — Telehealth: Payer: Self-pay

## 2013-12-18 NOTE — Telephone Encounter (Signed)
Diabetic Bundle. Lvom for pt to call back and schedule appointment.  

## 2013-12-19 ENCOUNTER — Ambulatory Visit: Payer: Medicare HMO | Admitting: Endocrinology

## 2013-12-31 ENCOUNTER — Ambulatory Visit (INDEPENDENT_AMBULATORY_CARE_PROVIDER_SITE_OTHER): Payer: Medicare HMO | Admitting: *Deleted

## 2013-12-31 DIAGNOSIS — I4891 Unspecified atrial fibrillation: Secondary | ICD-10-CM

## 2013-12-31 DIAGNOSIS — Z5181 Encounter for therapeutic drug level monitoring: Secondary | ICD-10-CM

## 2013-12-31 LAB — POCT INR: INR: 1.9

## 2014-01-21 ENCOUNTER — Ambulatory Visit (INDEPENDENT_AMBULATORY_CARE_PROVIDER_SITE_OTHER): Payer: Medicare HMO | Admitting: *Deleted

## 2014-01-21 DIAGNOSIS — Z5181 Encounter for therapeutic drug level monitoring: Secondary | ICD-10-CM

## 2014-01-21 DIAGNOSIS — I4891 Unspecified atrial fibrillation: Secondary | ICD-10-CM

## 2014-01-21 LAB — POCT INR: INR: 1.8

## 2014-02-06 ENCOUNTER — Ambulatory Visit (INDEPENDENT_AMBULATORY_CARE_PROVIDER_SITE_OTHER): Payer: Medicare HMO | Admitting: *Deleted

## 2014-02-06 DIAGNOSIS — I4891 Unspecified atrial fibrillation: Secondary | ICD-10-CM

## 2014-02-06 DIAGNOSIS — Z5181 Encounter for therapeutic drug level monitoring: Secondary | ICD-10-CM

## 2014-02-06 LAB — POCT INR: INR: 2.5

## 2014-02-06 MED ORDER — WARFARIN SODIUM 5 MG PO TABS: ORAL_TABLET | ORAL | Status: DC

## 2014-02-26 NOTE — Telephone Encounter (Signed)
error 

## 2014-03-07 ENCOUNTER — Other Ambulatory Visit: Payer: Self-pay | Admitting: Endocrinology

## 2014-03-09 ENCOUNTER — Other Ambulatory Visit: Payer: Self-pay | Admitting: Nurse Practitioner

## 2014-03-09 ENCOUNTER — Ambulatory Visit (INDEPENDENT_AMBULATORY_CARE_PROVIDER_SITE_OTHER): Payer: Medicare HMO

## 2014-03-09 DIAGNOSIS — I4891 Unspecified atrial fibrillation: Secondary | ICD-10-CM

## 2014-03-09 DIAGNOSIS — Z5181 Encounter for therapeutic drug level monitoring: Secondary | ICD-10-CM

## 2014-03-09 LAB — POCT INR: INR: 2.7

## 2014-03-11 ENCOUNTER — Other Ambulatory Visit: Payer: Self-pay | Admitting: Endocrinology

## 2014-03-23 ENCOUNTER — Other Ambulatory Visit: Payer: Medicare HMO

## 2014-03-26 ENCOUNTER — Other Ambulatory Visit: Payer: Self-pay | Admitting: *Deleted

## 2014-03-26 ENCOUNTER — Other Ambulatory Visit: Payer: Medicare HMO

## 2014-03-26 ENCOUNTER — Other Ambulatory Visit (INDEPENDENT_AMBULATORY_CARE_PROVIDER_SITE_OTHER): Payer: Medicare HMO

## 2014-03-26 DIAGNOSIS — E1165 Type 2 diabetes mellitus with hyperglycemia: Secondary | ICD-10-CM

## 2014-03-26 DIAGNOSIS — IMO0002 Reserved for concepts with insufficient information to code with codable children: Secondary | ICD-10-CM

## 2014-03-26 LAB — BASIC METABOLIC PANEL
BUN: 25 mg/dL — ABNORMAL HIGH (ref 6–23)
CHLORIDE: 102 meq/L (ref 96–112)
CO2: 29 meq/L (ref 19–32)
CREATININE: 1.1 mg/dL (ref 0.4–1.2)
Calcium: 10.1 mg/dL (ref 8.4–10.5)
GFR: 65.19 mL/min (ref >60.00)
Glucose, Bld: 117 mg/dL — ABNORMAL HIGH (ref 70–99)
Potassium: 4.2 mEq/L (ref 3.5–5.1)
Sodium: 140 mEq/L (ref 135–145)

## 2014-03-26 LAB — HEMOGLOBIN A1C: HEMOGLOBIN A1C: 6.7 % — AB (ref 4.6–6.5)

## 2014-03-30 ENCOUNTER — Ambulatory Visit (INDEPENDENT_AMBULATORY_CARE_PROVIDER_SITE_OTHER): Payer: Medicare HMO | Admitting: Endocrinology

## 2014-03-30 ENCOUNTER — Other Ambulatory Visit: Payer: Self-pay | Admitting: *Deleted

## 2014-03-30 ENCOUNTER — Encounter: Payer: Self-pay | Admitting: Endocrinology

## 2014-03-30 VITALS — BP 158/92 | HR 83 | Temp 97.8°F | Resp 16 | Ht 63.0 in | Wt 265.2 lb

## 2014-03-30 DIAGNOSIS — E785 Hyperlipidemia, unspecified: Secondary | ICD-10-CM

## 2014-03-30 DIAGNOSIS — I10 Essential (primary) hypertension: Secondary | ICD-10-CM

## 2014-03-30 DIAGNOSIS — IMO0002 Reserved for concepts with insufficient information to code with codable children: Secondary | ICD-10-CM

## 2014-03-30 DIAGNOSIS — E042 Nontoxic multinodular goiter: Secondary | ICD-10-CM

## 2014-03-30 DIAGNOSIS — E1165 Type 2 diabetes mellitus with hyperglycemia: Secondary | ICD-10-CM

## 2014-03-30 MED ORDER — CLONIDINE HCL 0.2 MG PO TABS: 0.2000 mg | ORAL_TABLET | Freq: Two times a day (BID) | ORAL | Status: DC

## 2014-03-30 MED ORDER — CLONIDINE HCL 0.2 MG/24HR TD PTWK: 0.2000 mg | MEDICATED_PATCH | TRANSDERMAL | Status: DC

## 2014-03-30 MED ORDER — METFORMIN HCL ER 500 MG PO TB24: 500.0000 mg | ORAL_TABLET | Freq: Three times a day (TID) | ORAL | Status: DC

## 2014-03-30 MED ORDER — INSULIN PEN NEEDLE 31G X 5 MM MISC
Status: DC
Start: 2014-03-30 — End: 2014-04-06

## 2014-03-30 NOTE — Patient Instructions (Signed)
Stop Glimeperide   Please check blood sugars at least half the time about 2 hours after any meal and 3 times per week on waking up. Please bring blood sugar monitor to each visit  Increase Clonidine patch to 2, new Rx will be for 2mg 

## 2014-03-30 NOTE — Progress Notes (Signed)
Patient ID: Megan Fischer, female   DOB: Aug 24, 1950, 63 y.o.   MRN: [id number]   Reason for Appointment: Diabetes follow-up   History of Present Illness   Diagnosis: Type 2 DIABETES MELITUS, date of diagnosis:  2000     Previous history: She has been on various regimens of treatment in the past including Byetta and generally has done better with GLP-1 drugs She continues to take low-dose glimepiride which helps with overall control. Usually blood sugars are not variable  Recent history: She has been on Victoza since at least 2013 with good control Because of occasional hypoglycemia her Amaryl was supposed to be stopped in 2/15 However she now says that she is still taking half a tablet Has not been seen in follow-up since 5/15 She continues to take metformin ER, 3 tablets a day also However has not had any side effects from her medications Her blood sugars are excellent at home but at times has had low blood sugars especially when not eating as much Despite decreased appetite she has gained significant amount of weight which she thinks is from decreased activity Checking blood sugars at various times of the day and sporadic high readings including once over 200 Fasting blood sugars are very well controlled without overnight hypoglycemia   Oral hypoglycemic drugs: Metformin and Amaryl 1/2      Side effects from medications: None Monitors blood glucose: Once a day.    Glucometer:  contour      Blood Glucose readings from meter download:   PREMEAL Breakfast Lunch  2-7 PM   7-8 PM  Overall  Glucose range:  78-152   58-143   49-255   68-177    Median:  108    97    109         Hypoglycemia: Has 3 low blood sugars between 10/29 and 03/07/14 in the afternoon and early evening, all asymptomatic    Meals: 2-3 meals per day.          Physical activity: exercise: Unable to walk because of leg pain           Dietician visit: Most recent: 2005   Wt Readings from Last 3 Encounters:  03/30/14  265 lb 3.2 oz (120.294 kg)  09/25/13 253 lb 9.6 oz (115.032 kg)  09/19/13 252 lb 12.8 oz (114.669 kg)   Lab Results  Component Value Date   HGBA1C 6.7* 03/26/2014   HGBA1C 7.0* 09/16/2013   HGBA1C 6.6* 06/19/2013   Lab Results  Component Value Date   MICROALBUR 2.7* 09/16/2013   LDLCALC 83 09/16/2013   CREATININE 1.1 03/26/2014      Medication List       This list is accurate as of: 03/30/14 11:30 AM.  Always use your most recent med list.               benazepril-hydrochlorthiazide 20-12.5 MG per tablet  Commonly known as:  LOTENSIN HCT  TAKE ONE TABLET BY MOUTH ONCE DAILY     CALCIUM PO  Take by mouth 2 (two) times daily.     cloNIDine 0.1 mg/24hr patch  Commonly known as:  CATAPRES-TTS-1  Place 1 patch (0.1 mg total) onto the skin once a week.     diltiazem 240 MG 24 hr capsule  Commonly known as:  CARDIZEM CD  Take 1 capsule (240 mg total) by mouth daily.     ergocalciferol 50000 UNITS capsule  Commonly known as:  VITAMIN D2  Take 50,000  Units by mouth once a week.     glimepiride 2 MG tablet  Commonly known as:  AMARYL  Take 1 tablet (2 mg total) by mouth daily with breakfast.     glucose blood test strip  Commonly known as:  ONETOUCH VERIO  Use as instructed to check blood sugar 2 times per day dx code 250.02     guanFACINE 2 MG tablet  Commonly known as:  TENEX  Take by mouth.     Insulin Pen Needle 31G X 5 MM Misc  Use as directed 1 time per day     Liraglutide 18 MG/3ML Sopn  Commonly known as:  VICTOZA  Inject 1.2 mg daily     metFORMIN 500 MG 24 hr tablet  Commonly known as:  GLUCOPHAGE XR  Take 1 tablet (500 mg total) by mouth 3 (three) times daily.     metoprolol succinate 100 MG 24 hr tablet  Commonly known as:  TOPROL-XL  Take 1 tablet (100 mg total) by mouth daily. Take with or immediately following a meal.     ONETOUCH DELICA LANCETS FINE Misc  Use to obtain a blood specimen 2 times per day dx code 250.02     ONETOUCH VERIO  IQ SYSTEM W/DEVICE Kit  Use to check blood sugar 2 times per day dx code 250.02     rosuvastatin 10 MG tablet  Commonly known as:  CRESTOR  Take 1 tablet (10 mg total) by mouth daily.     warfarin 5 MG tablet  Commonly known as:  COUMADIN  Take as directed by coumadin clinic        Allergies:  Allergies  Allergen Reactions  . Codeine   . Oxycodone Hcl   . Penicillins     Past Medical History  Diagnosis Date  . Obesities, morbid   . Atrial fibrillation   . Hypertension   . Dyslipidemia   . Diabetes mellitus   . Osteoarthritis   . Chronic disease anemia 03/29/2011    Past Surgical History  Procedure Laterality Date  . Cholecystectomy    . Tubal ligation    . Replacement total knee bilateral    . Shoulder surgery      RIGHT SHOULDER    Family History  Problem Relation Age of Onset  . Heart attack Mother   . Stroke Mother   . Diabetes Mother   . Hypertension Brother     Social History:  reports that she has never smoked. She does not have any smokeless tobacco history on file. She reports that she does not drink alcohol or use illicit drugs.  Review of Systems:  History of atrial fibrillation: She is on Coumadin and followed by cardiologist periodically  Hypertension:  she has required multiple medications and blood pressure is higher.  Her Tenex was changed because of insurance denial to clonidine patch which she is using without side effects.  Currently taking 1 mg patch.  Previously had nausea with clonidine tablets  Lipids: Generally well controlled with Crestor, no side effects  Lab Results  Component Value Date   CHOL 169 09/16/2013   HDL 71.90 09/16/2013   LDLCALC 83 09/16/2013   TRIG 70.0 09/16/2013   CHOLHDL 2 09/16/2013    History of goiter, long-standing which is euthyroid, tends to have relatively low TSH without history of hyperthyroidism   Lab Results  Component Value Date   FREET4 0.97 09/16/2013   FREET4 1.17 01/20/2013   TSH  0.30* 09/16/2013  TSH 0.31* 01/20/2013   History of anemia followed by hematologist usually   Examination:   BP 158/92 mmHg  Pulse 83  Temp(Src) 97.8 F (36.6 C)  Resp 16  Ht 5' 3"  (1.6 m)  Wt 265 lb 3.2 oz (120.294 kg)  BMI 46.99 kg/m2  SpO2 98%  Body mass index is 46.99 kg/(m^2).   Repeat blood pressure with large cuff 155/85  ASSESSMENT/ PLAN:   Diabetes type 2   Blood glucose control is fairly good and she is taking Victoza, low-dose Amaryl  and metformin now  She has gained a significant amount of weight from an activity and possibly poor diet overall Currently however she is having tendency to either low normal or low sugars in the late afternoon with her Amaryl Blood sugars are usually fairly good at other times A1c is still near normal as before  She can continue taking the Victoza in the evening  We will stop her Amaryl for now  HYPERTENSION:Relatively higher today, previously well controlled. She will increase her Catapres patch to 2 mg Continue other medications   Long-standing multinodular goiter: She has had mildly suppressed TSH without hyperthyroidism and this is stable    Sherrilyn Nairn 03/30/2014, 11:30 AM

## 2014-04-06 ENCOUNTER — Telehealth: Payer: Self-pay | Admitting: Endocrinology

## 2014-04-06 ENCOUNTER — Other Ambulatory Visit: Payer: Self-pay | Admitting: *Deleted

## 2014-04-06 MED ORDER — ROSUVASTATIN CALCIUM 10 MG PO TABS: 10.0000 mg | ORAL_TABLET | Freq: Every day | ORAL | Status: DC

## 2014-04-06 MED ORDER — INSULIN PEN NEEDLE 32G X 4 MM MISC: Status: DC

## 2014-04-06 NOTE — Telephone Encounter (Signed)
rx correct and sent in for a 90 day supply

## 2014-04-06 NOTE — Telephone Encounter (Signed)
Please call in 90 day supply of crestor and the pen needle script is incorrect

## 2014-04-10 ENCOUNTER — Telehealth: Payer: Self-pay | Admitting: Hematology

## 2014-04-10 NOTE — Telephone Encounter (Signed)
, °

## 2014-04-13 ENCOUNTER — Ambulatory Visit (INDEPENDENT_AMBULATORY_CARE_PROVIDER_SITE_OTHER): Payer: Medicare HMO | Admitting: *Deleted

## 2014-04-13 DIAGNOSIS — Z5181 Encounter for therapeutic drug level monitoring: Secondary | ICD-10-CM

## 2014-04-13 DIAGNOSIS — I4891 Unspecified atrial fibrillation: Secondary | ICD-10-CM

## 2014-04-13 LAB — POCT INR: INR: 3.2

## 2014-04-24 ENCOUNTER — Other Ambulatory Visit: Payer: Self-pay | Admitting: Internal Medicine

## 2014-04-24 DIAGNOSIS — M5412 Radiculopathy, cervical region: Secondary | ICD-10-CM

## 2014-05-07 ENCOUNTER — Other Ambulatory Visit: Payer: Medicare HMO

## 2014-05-11 ENCOUNTER — Ambulatory Visit (INDEPENDENT_AMBULATORY_CARE_PROVIDER_SITE_OTHER): Payer: Medicare Other | Admitting: Pharmacist

## 2014-05-11 DIAGNOSIS — Z5181 Encounter for therapeutic drug level monitoring: Secondary | ICD-10-CM

## 2014-05-11 DIAGNOSIS — I4891 Unspecified atrial fibrillation: Secondary | ICD-10-CM

## 2014-05-11 LAB — POCT INR: INR: 4.6

## 2014-06-05 ENCOUNTER — Other Ambulatory Visit: Payer: Self-pay | Admitting: Endocrinology

## 2014-06-10 ENCOUNTER — Telehealth: Payer: Self-pay | Admitting: Endocrinology

## 2014-06-10 MED ORDER — INSULIN PEN NEEDLE 32G X 4 MM MISC: Status: DC

## 2014-06-10 NOTE — Telephone Encounter (Signed)
rx was filled on 03/2014. 90 day supply with 1 refill.   Spoke to pt and pt stated that pharmacy does not have a refill on file.

## 2014-06-10 NOTE — Telephone Encounter (Signed)
Patient would like her pen needles called in    [facility]   Thank you

## 2014-06-11 ENCOUNTER — Ambulatory Visit (INDEPENDENT_AMBULATORY_CARE_PROVIDER_SITE_OTHER): Payer: Medicare Other | Admitting: *Deleted

## 2014-06-11 DIAGNOSIS — Z5181 Encounter for therapeutic drug level monitoring: Secondary | ICD-10-CM

## 2014-06-11 DIAGNOSIS — I4891 Unspecified atrial fibrillation: Secondary | ICD-10-CM

## 2014-06-11 LAB — POCT INR: INR: 3.1

## 2014-06-25 ENCOUNTER — Other Ambulatory Visit (INDEPENDENT_AMBULATORY_CARE_PROVIDER_SITE_OTHER): Payer: Medicare Other

## 2014-06-25 DIAGNOSIS — E042 Nontoxic multinodular goiter: Secondary | ICD-10-CM

## 2014-06-25 DIAGNOSIS — IMO0002 Reserved for concepts with insufficient information to code with codable children: Secondary | ICD-10-CM

## 2014-06-25 DIAGNOSIS — E785 Hyperlipidemia, unspecified: Secondary | ICD-10-CM

## 2014-06-25 DIAGNOSIS — E1165 Type 2 diabetes mellitus with hyperglycemia: Secondary | ICD-10-CM

## 2014-06-25 LAB — URINALYSIS, ROUTINE W REFLEX MICROSCOPIC
BILIRUBIN URINE: NEGATIVE
Hgb urine dipstick: NEGATIVE
Ketones, ur: NEGATIVE
LEUKOCYTES UA: NEGATIVE
Nitrite: NEGATIVE
PH: 6.5 (ref 5.0–8.0)
SPECIFIC GRAVITY, URINE: 1.015 (ref 1.000–1.030)
Total Protein, Urine: NEGATIVE
Urine Glucose: NEGATIVE
Urobilinogen, UA: 0.2 (ref 0.0–1.0)

## 2014-06-25 LAB — COMPREHENSIVE METABOLIC PANEL
ALT: 13 U/L (ref 0–35)
AST: 20 U/L (ref 0–37)
Albumin: 3.9 g/dL (ref 3.5–5.2)
Alkaline Phosphatase: 79 U/L (ref 39–117)
BUN: 22 mg/dL (ref 6–23)
CALCIUM: 10.1 mg/dL (ref 8.4–10.5)
CHLORIDE: 102 meq/L (ref 96–112)
CO2: 31 mEq/L (ref 19–32)
Creatinine, Ser: 1.02 mg/dL (ref 0.40–1.20)
GFR: 70.32 mL/min (ref >60.00)
GLUCOSE: 105 mg/dL — AB (ref 70–99)
Potassium: 4 mEq/L (ref 3.5–5.1)
Sodium: 139 mEq/L (ref 135–145)
Total Bilirubin: 0.4 mg/dL (ref 0.2–1.2)
Total Protein: 7.9 g/dL (ref 6.0–8.3)

## 2014-06-25 LAB — LIPID PANEL
Cholesterol: 180 mg/dL (ref 0–200)
HDL: 73.1 mg/dL (ref >39.00)
LDL Cholesterol: 90 mg/dL (ref 0–99)
NONHDL: 106.9
Total CHOL/HDL Ratio: 2
Triglycerides: 87 mg/dL (ref 0.0–149.0)
VLDL: 17.4 mg/dL (ref 0.0–40.0)

## 2014-06-25 LAB — MICROALBUMIN / CREATININE URINE RATIO
CREATININE, U: 132.3 mg/dL
MICROALB UR: 1.2 mg/dL (ref 0.0–1.9)
Microalb Creat Ratio: 0.9 mg/g (ref 0.0–30.0)

## 2014-06-25 LAB — HEMOGLOBIN A1C: HEMOGLOBIN A1C: 6.8 % — AB (ref 4.6–6.5)

## 2014-06-25 LAB — TSH: TSH: 0.41 u[IU]/mL (ref 0.35–4.50)

## 2014-06-25 LAB — T4, FREE: Free T4: 1.13 ng/dL (ref 0.60–1.60)

## 2014-06-30 ENCOUNTER — Encounter: Payer: Self-pay | Admitting: Endocrinology

## 2014-06-30 ENCOUNTER — Ambulatory Visit (INDEPENDENT_AMBULATORY_CARE_PROVIDER_SITE_OTHER): Payer: Medicare Other | Admitting: Endocrinology

## 2014-06-30 VITALS — BP 118/62 | HR 84 | Temp 98.7°F | Resp 12 | Wt 263.0 lb

## 2014-06-30 DIAGNOSIS — E119 Type 2 diabetes mellitus without complications: Secondary | ICD-10-CM

## 2014-06-30 DIAGNOSIS — I10 Essential (primary) hypertension: Secondary | ICD-10-CM

## 2014-06-30 DIAGNOSIS — E042 Nontoxic multinodular goiter: Secondary | ICD-10-CM

## 2014-06-30 NOTE — Progress Notes (Signed)
Patient ID: Megan Fischer, female   DOB: [birth year], 64 y.o.   MRN: [id number]   Reason for Appointment: Diabetes follow-up   History of Present Illness   Diagnosis: Type 2 DIABETES MELITUS, date of diagnosis:  2000     Previous history: She has been on various regimens of treatment in the past including Byetta and generally has done better with GLP-1 drugs She continues to take low-dose glimepiride which helps with overall control. Usually blood sugars are not variable  Recent history: She has been on Victoza since at least 2013 with good control Because of occasional hypoglycemia her Amaryl was stopped in 03/2014 With this her blood sugars are reportedly fairly good at home and has not had any low blood sugars However did not bring her monitor for download and not clear what her blood sugar patterns are A1c is about the same at 6.8 Her weight has come down a couple of pounds and she is still fairly good with her diet She continues to take metformin ER, 3 tablets a day also However has not had any side effects from her medications   Oral hypoglycemic drugs: Metformin       Side effects from medications: None Monitors blood glucose: Once a day.    Glucometer:  contour      Blood Glucose readings from recall: Range 99 -167, some after meals    Meals: 2-3 meals per day.          Physical activity: exercise: Unable to walk because of leg pain           Dietician visit: Most recent: 2005   Wt Readings from Last 3 Encounters:  06/30/14 263 lb (119.296 kg)  03/30/14 265 lb 3.2 oz (120.294 kg)  09/25/13 253 lb 9.6 oz (115.032 kg)   Lab Results  Component Value Date   HGBA1C 6.8* 06/25/2014   HGBA1C 6.7* 03/26/2014   HGBA1C 7.0* 09/16/2013   Lab Results  Component Value Date   MICROALBUR 1.2 06/25/2014   LDLCALC 90 06/25/2014   CREATININE 1.02 06/25/2014      Medication List       This list is accurate as of: 06/30/14 10:09 AM.  Always use your most recent med list.                benazepril-hydrochlorthiazide 20-12.5 MG per tablet  Commonly known as:  LOTENSIN HCT  TAKE ONE TABLET BY MOUTH ONCE DAILY     benazepril-hydrochlorthiazide 20-12.5 MG per tablet  Commonly known as:  LOTENSIN HCT  TAKE ONE TABLET BY MOUTH ONCE DAILY     CALCIUM PO  Take by mouth 2 (two) times daily.     cloNIDine 0.2 mg/24hr patch  Commonly known as:  CATAPRES-TTS-2  Place 1 patch (0.2 mg total) onto the skin once a week.     diltiazem 240 MG 24 hr capsule  Commonly known as:  CARDIZEM CD  Take 1 capsule (240 mg total) by mouth daily.     CARTIA XT 240 MG 24 hr capsule  Generic drug:  diltiazem  TAKE ONE CAPSULE BY MOUTH ONCE DAILY     ergocalciferol 50000 UNITS capsule  Commonly known as:  VITAMIN D2  Take 50,000 Units by mouth once a week.     glimepiride 2 MG tablet  Commonly known as:  AMARYL  Take 1 tablet (2 mg total) by mouth daily with breakfast.     glucose blood test strip  Commonly known as:  ONETOUCH  VERIO  Use as instructed to check blood sugar 2 times per day dx code 250.02     Insulin Pen Needle 32G X 4 MM Misc  Commonly known as:  BD PEN NEEDLE NANO U/F  Use one per day to inject Victoza     Liraglutide 18 MG/3ML Sopn  Commonly known as:  VICTOZA  Inject 1.2 mg daily     metFORMIN 500 MG 24 hr tablet  Commonly known as:  GLUCOPHAGE XR  Take 1 tablet (500 mg total) by mouth 3 (three) times daily.     metoprolol succinate 100 MG 24 hr tablet  Commonly known as:  TOPROL-XL  Take 1 tablet (100 mg total) by mouth daily. Take with or immediately following a meal.     metoprolol succinate 100 MG 24 hr tablet  Commonly known as:  TOPROL-XL  TAKE ONE TABLET BY MOUTH ONCE DAILY. TAKE WITH OR IMMEDIATELY FOLLOWING A MEAL     ONETOUCH DELICA LANCETS FINE Misc  Use to obtain a blood specimen 2 times per day dx code 250.02     ONETOUCH VERIO IQ SYSTEM W/DEVICE Kit  Use to check blood sugar 2 times per day dx code 250.02     rosuvastatin 10  MG tablet  Commonly known as:  CRESTOR  Take 1 tablet (10 mg total) by mouth daily.     warfarin 5 MG tablet  Commonly known as:  COUMADIN  Take as directed by coumadin clinic        Allergies:  Allergies  Allergen Reactions  . Codeine   . Oxycodone Hcl   . Penicillins     Past Medical History  Diagnosis Date  . Obesities, morbid   . Atrial fibrillation   . Hypertension   . Dyslipidemia   . Diabetes mellitus   . Osteoarthritis   . Chronic disease anemia 03/29/2011    Past Surgical History  Procedure Laterality Date  . Cholecystectomy    . Tubal ligation    . Replacement total knee bilateral    . Shoulder surgery      RIGHT SHOULDER    Family History  Problem Relation Age of Onset  . Heart attack Mother   . Stroke Mother   . Diabetes Mother   . Hypertension Brother     Social History:  reports that she has never smoked. She does not have any smokeless tobacco history on file. She reports that she does not drink alcohol or use illicit drugs.  Review of Systems:  History of atrial fibrillation: She is on Coumadin and followed by cardiologist and is due for an appointment  Hypertension:  she has required multiple medications and blood pressure is higher.  Her Tenex was changed because of insurance denial to clonidine patch which she is using without side effects.  Currently taking 2 mg patch.  Her blood pressure is significantly high today but she forgot to put her new patch on this morning after taking the old one off.  She thinks she is otherwise compliant with this She thinks her blood pressure was fairly good with PCP in December  Previously had nausea with clonidine tablets  Lipids: Generally well controlled with Crestor and she is compliant with this.  Lab Results  Component Value Date   CHOL 180 06/25/2014   HDL 73.10 06/25/2014   LDLCALC 90 06/25/2014   TRIG 87.0 06/25/2014   CHOLHDL 2 06/25/2014    History of goiter, long-standing which is  euthyroid, tends to have  relatively low TSH without history of hyperthyroidism, TSH now normal   Lab Results  Component Value Date   FREET4 1.13 06/25/2014   FREET4 0.97 09/16/2013   FREET4 1.17 01/20/2013   TSH 0.41 06/25/2014   TSH 0.30* 09/16/2013   TSH 0.31* 01/20/2013   History of anemia followed by hematologist    Examination:   BP 118/62 mmHg  Pulse 84  Temp(Src) 98.7 F (37.1 C) (Oral)  Resp 12  Wt 263 lb (119.296 kg)  SpO2 94%  Body mass index is 46.6 kg/(m^2).    140/90 large cuff standing No edema  She has a multinodular goiter about twice normal  ASSESSMENT/ PLAN:   Diabetes type 2   Blood glucose control is fairly good and she is taking Victoza and 1500 mg metformin Her weight has leveled off and slightly better with stopping Amaryl in 11/15 Blood sugars appear to be overall fairly well controlled and she reports good readings at home She can continue taking the Victoza in the evening   HYPERTENSION:Relatively higher today, previously well controlled and likely to be from her not putting on her clonidine patch this morning. She will follow-up in 3 months   Long-standing multinodular goiter: She has had mildly suppressed TSH but it is back to normal now    [facility] 06/30/2014, 10:09 AM

## 2014-06-30 NOTE — Patient Instructions (Signed)
Please check blood sugars at least half the time about 2 hours after any meal and 3 times per week on waking up.  Please bring blood sugar monitor to each visit.  Recommended blood sugar levels about 2 hours after meal is 140-180 and on waking up 90-130   

## 2014-07-10 ENCOUNTER — Ambulatory Visit (INDEPENDENT_AMBULATORY_CARE_PROVIDER_SITE_OTHER): Payer: Medicare Other | Admitting: *Deleted

## 2014-07-10 DIAGNOSIS — Z5181 Encounter for therapeutic drug level monitoring: Secondary | ICD-10-CM

## 2014-07-10 DIAGNOSIS — I4891 Unspecified atrial fibrillation: Secondary | ICD-10-CM

## 2014-07-10 LAB — POCT INR: INR: 3.3

## 2014-08-07 ENCOUNTER — Ambulatory Visit (INDEPENDENT_AMBULATORY_CARE_PROVIDER_SITE_OTHER): Payer: Medicare Other | Admitting: Pharmacist

## 2014-08-07 DIAGNOSIS — I4891 Unspecified atrial fibrillation: Secondary | ICD-10-CM

## 2014-08-07 DIAGNOSIS — Z5181 Encounter for therapeutic drug level monitoring: Secondary | ICD-10-CM | POA: Diagnosis not present

## 2014-08-07 LAB — POCT INR: INR: 2.7

## 2014-09-03 ENCOUNTER — Other Ambulatory Visit: Payer: Self-pay | Admitting: *Deleted

## 2014-09-03 ENCOUNTER — Telehealth: Payer: Self-pay | Admitting: Endocrinology

## 2014-09-03 ENCOUNTER — Other Ambulatory Visit: Payer: Self-pay | Admitting: Cardiovascular Disease

## 2014-09-03 ENCOUNTER — Other Ambulatory Visit: Payer: Self-pay | Admitting: Endocrinology

## 2014-09-03 MED ORDER — DILTIAZEM HCL ER COATED BEADS 240 MG PO CP24: 240.0000 mg | ORAL_CAPSULE | Freq: Every day | ORAL | Status: DC

## 2014-09-03 MED ORDER — METFORMIN HCL ER 500 MG PO TB24: 500.0000 mg | ORAL_TABLET | Freq: Three times a day (TID) | ORAL | Status: DC

## 2014-09-03 MED ORDER — BENAZEPRIL-HYDROCHLOROTHIAZIDE 20-12.5 MG PO TABS: 1.0000 | ORAL_TABLET | Freq: Every day | ORAL | Status: DC

## 2014-09-03 MED ORDER — INSULIN PEN NEEDLE 32G X 4 MM MISC: Status: DC

## 2014-09-03 MED ORDER — GLIMEPIRIDE 2 MG PO TABS: ORAL_TABLET | ORAL | Status: DC

## 2014-09-03 NOTE — Telephone Encounter (Signed)
Pt called needs refill on al medications except victoza , also needs pen needles  And wants 3 mont supply for all meds

## 2014-09-04 ENCOUNTER — Ambulatory Visit (INDEPENDENT_AMBULATORY_CARE_PROVIDER_SITE_OTHER): Payer: Medicare Other | Admitting: *Deleted

## 2014-09-04 DIAGNOSIS — I4891 Unspecified atrial fibrillation: Secondary | ICD-10-CM | POA: Diagnosis not present

## 2014-09-04 DIAGNOSIS — Z5181 Encounter for therapeutic drug level monitoring: Secondary | ICD-10-CM | POA: Diagnosis not present

## 2014-09-04 LAB — POCT INR: INR: 1.7

## 2014-09-23 ENCOUNTER — Other Ambulatory Visit: Payer: Self-pay | Admitting: *Deleted

## 2014-09-23 DIAGNOSIS — D638 Anemia in other chronic diseases classified elsewhere: Secondary | ICD-10-CM

## 2014-09-24 ENCOUNTER — Ambulatory Visit: Payer: Medicare Other | Admitting: Endocrinology

## 2014-09-24 ENCOUNTER — Ambulatory Visit (HOSPITAL_BASED_OUTPATIENT_CLINIC_OR_DEPARTMENT_OTHER): Payer: Medicare Other | Admitting: Hematology

## 2014-09-24 ENCOUNTER — Encounter: Payer: Self-pay | Admitting: Hematology

## 2014-09-24 ENCOUNTER — Other Ambulatory Visit (HOSPITAL_BASED_OUTPATIENT_CLINIC_OR_DEPARTMENT_OTHER): Payer: Medicare Other

## 2014-09-24 ENCOUNTER — Telehealth: Payer: Self-pay | Admitting: Hematology

## 2014-09-24 VITALS — BP 122/92 | HR 87 | Temp 97.7°F | Resp 18 | Ht 63.0 in | Wt 268.2 lb

## 2014-09-24 DIAGNOSIS — I10 Essential (primary) hypertension: Secondary | ICD-10-CM | POA: Diagnosis not present

## 2014-09-24 DIAGNOSIS — D638 Anemia in other chronic diseases classified elsewhere: Secondary | ICD-10-CM

## 2014-09-24 DIAGNOSIS — E119 Type 2 diabetes mellitus without complications: Secondary | ICD-10-CM | POA: Diagnosis not present

## 2014-09-24 LAB — CBC & DIFF AND RETIC
BASO%: 0.3 % (ref 0.0–2.0)
Basophils Absolute: 0 10*3/uL (ref 0.0–0.1)
EOS%: 0.5 % (ref 0.0–7.0)
Eosinophils Absolute: 0 10*3/uL (ref 0.0–0.5)
HEMATOCRIT: 34.3 % — AB (ref 34.8–46.6)
HGB: 11.1 g/dL — ABNORMAL LOW (ref 11.6–15.9)
IMMATURE RETIC FRACT: 4.8 % (ref 1.60–10.00)
LYMPH%: 38.9 % (ref 14.0–49.7)
MCH: 28.8 pg (ref 25.1–34.0)
MCHC: 32.4 g/dL (ref 31.5–36.0)
MCV: 88.9 fL (ref 79.5–101.0)
MONO#: 0.4 10*3/uL (ref 0.1–0.9)
MONO%: 5.7 % (ref 0.0–14.0)
NEUT#: 3.6 10*3/uL (ref 1.5–6.5)
NEUT%: 54.6 % (ref 38.4–76.8)
PLATELETS: 259 10*3/uL (ref 145–400)
RBC: 3.86 10*6/uL (ref 3.70–5.45)
RDW: 15.5 % — ABNORMAL HIGH (ref 11.2–14.5)
RETIC CT ABS: 47.48 10*3/uL (ref 33.70–90.70)
Retic %: 1.23 % (ref 0.70–2.10)
WBC: 6.5 10*3/uL (ref 3.9–10.3)
lymph#: 2.5 10*3/uL (ref 0.9–3.3)

## 2014-09-24 LAB — COMPREHENSIVE METABOLIC PANEL (CC13)
ALBUMIN: 3.3 g/dL — AB (ref 3.5–5.0)
ALT: 15 U/L (ref 0–55)
AST: 21 U/L (ref 5–34)
Alkaline Phosphatase: 85 U/L (ref 40–150)
Anion Gap: 11 mEq/L (ref 3–11)
BUN: 21.6 mg/dL (ref 7.0–26.0)
CO2: 23 mEq/L (ref 22–29)
Calcium: 9.2 mg/dL (ref 8.4–10.4)
Chloride: 106 mEq/L (ref 98–109)
Creatinine: 1.1 mg/dL (ref 0.6–1.1)
EGFR: 64 mL/min/{1.73_m2} — ABNORMAL LOW (ref >90)
GLUCOSE: 130 mg/dL (ref 70–140)
POTASSIUM: 4.4 meq/L (ref 3.5–5.1)
Sodium: 140 mEq/L (ref 136–145)
TOTAL PROTEIN: 7.4 g/dL (ref 6.4–8.3)
Total Bilirubin: 0.29 mg/dL (ref 0.20–1.20)

## 2014-09-24 LAB — IRON AND TIBC CHCC
%SAT: 17 % — ABNORMAL LOW (ref 21–57)
Iron: 50 ug/dL (ref 41–142)
TIBC: 301 ug/dL (ref 236–444)
UIBC: 251 ug/dL (ref 120–384)

## 2014-09-24 LAB — FERRITIN CHCC: FERRITIN: 65 ng/mL (ref 9–269)

## 2014-09-24 NOTE — Progress Notes (Addendum)
OFFICE PROGRESS NOTE  CC  [name], MD [address] [name] [address] Dr. Elayne Snare  DIAGNOSIS: 64 year old female with anemia of chronic disease.  PRIOR THERAPY:  #1 patient has received Aranesp injections every 4-8 weeks her last injection was given back in January 2013.  CURRENT THERAPY:Aranesp if needed  INTERVAL HISTORY: Megan Fischer 64 y.o. female returns for Followup visit today.  She is doing moderately well overall, her clinical course condition is stable. She has chronic knee pain, uses a cane. She is able to function on well at home. She denies any chest pain, dyspnea, or other new symptoms. No bleeding.  MEDICAL HISTORY: Past Medical History  Diagnosis Date  . Obesities, morbid   . Atrial fibrillation   . Hypertension   . Dyslipidemia   . Diabetes mellitus   . Osteoarthritis   . Chronic disease anemia 03/29/2011    ALLERGIES:  is allergic to codeine; oxycodone hcl; and penicillins.  MEDICATIONS:  Current Outpatient Prescriptions  Medication Sig Dispense Refill  . benazepril-hydrochlorthiazide (LOTENSIN HCT) 20-12.5 MG per tablet TAKE ONE TABLET BY MOUTH ONCE DAILY 90 tablet 0  . benazepril-hydrochlorthiazide (LOTENSIN HCT) 20-12.5 MG per tablet Take 1 tablet by mouth daily. 90 tablet 0  . Blood Glucose Monitoring Suppl (ONETOUCH VERIO IQ SYSTEM) W/DEVICE KIT Use to check blood sugar 2 times per day dx code 250.02 1 kit 0  . CALCIUM PO Take by mouth 2 (two) times daily.      . cloNIDine (CATAPRES-TTS-2) 0.2 mg/24hr patch Place 1 patch (0.2 mg total) onto the skin once a week. 4 patch 2  . CRESTOR 10 MG tablet TAKE ONE TABLET BY MOUTH ONCE DAILY 90 tablet 0  . diltiazem (CARDIZEM CD) 240 MG 24 hr capsule Take 1 capsule (240 mg total) by mouth daily. 90 capsule 0  . diltiazem (CARTIA XT) 240 MG 24 hr capsule Take 1 capsule (240 mg total) by mouth daily. 90 capsule 0  . ergocalciferol (VITAMIN D2) 50000 UNITS capsule Take 50,000 Units by mouth  once a week.      Marland Kitchen glimepiride (AMARYL) 2 MG tablet Take 1 tablet (2 mg total) by mouth daily with breakfast. 90 tablet 0  . glimepiride (AMARYL) 2 MG tablet TAKE ONE TABLET BY MOUTH ONCE DAILY WITH BREAKFAST 90 tablet 0  . glucose blood (ONETOUCH VERIO) test strip Use as instructed to check blood sugar 2 times per day dx code 250.02 100 each 3  . Insulin Pen Needle (BD PEN NEEDLE NANO U/F) 32G X 4 MM MISC Use one per day to inject Victoza 90 each 1  . Liraglutide (VICTOZA) 18 MG/3ML SOPN Inject 1.2 mg daily 6 pen 1  . metFORMIN (GLUCOPHAGE-XR) 500 MG 24 hr tablet Take 1 tablet (500 mg total) by mouth 3 (three) times daily. 270 tablet 0  . metoprolol succinate (TOPROL-XL) 100 MG 24 hr tablet TAKE ONE TABLET BY MOUTH ONCE DAILY. TAKE WITH OR IMMEDIATELY FOLLOWING A MEAL. 90 tablet 0  . ONETOUCH DELICA LANCETS FINE MISC Use to obtain a blood specimen 2 times per day dx code 250.02 200 each 0  . warfarin (COUMADIN) 5 MG tablet TAKE AS DIRECTED BY  COUMADIN  CLINIC 100 tablet 1   No current facility-administered medications for this visit.    SURGICAL HISTORY:  Past Surgical History  Procedure Laterality Date  . Cholecystectomy    . Tubal ligation    . Replacement total knee bilateral    . Shoulder surgery  RIGHT SHOULDER    REVIEW OF SYSTEMS:   A 10 point review of systems was conducted and is otherwise negative except for what is noted above.      PHYSICAL EXAMINATION:  BP 122/92 mmHg  Pulse 87  Temp(Src) 97.7 F (36.5 C) (Oral)  Resp 18  Ht _0  (1.6 m)  Wt 268 lb 3.2 oz (121.655 kg)  BMI 47.52 kg/m2  SpO2 100% General: Patient is a well appearing female in no acute distress HEENT: PERRLA, sclerae anicteric no conjunctival pallor, MMM, does have some thyromegaly Neck: supple, no palpable adenopathy Lungs: clear to auscultation bilaterally, no wheezes, rhonchi, or rales Cardiovascular: regular rate rhythm, S1, S2, no murmurs, rubs or gallops Abdomen: Soft, non-tender,  non-distended, normoactive bowel sounds, no HSM Extremities: warm and well perfused, no clubbing, cyanosis, or edema Skin: No rashes or lesions Neuro: Non-focal ECOG PERFORMANCE STATUS: 1 - Symptomatic but completely ambulatory  LABORATORY DATA: CBC Latest Ref Rng 09/24/2014 09/25/2013 07/31/2013  WBC 3.9 - 10.3 10e3/uL 6.5 6.7 6.9  Hemoglobin 11.6 - 15.9 g/dL 11.1(L) 11.9 11.6  Hematocrit 34.8 - 46.6 % 34.3(L) 36.8 36.0  Platelets 145 - 400 10e3/uL 259 290 244    CMP Latest Ref Rng 06/25/2014 03/26/2014 09/16/2013  Glucose 70 - 99 mg/dL 105(H) 117(H) 101(H)  BUN 6 - 23 mg/dL 22 25(H) 38(H)  Creatinine 0.40 - 1.20 mg/dL 1.02 1.1 1.3(H)  Sodium 135 - 145 mEq/L 139 140 140  Potassium 3.5 - 5.1 mEq/L 4.0 4.2 4.2  Chloride 96 - 112 mEq/L 102 102 106  CO2 19 - 32 mEq/L _1 Calcium 8.4 - 10.5 mg/dL 10.1 10.1 10.1  Total Protein 6.0 - 8.3 g/dL 7.9 - 8.1  Total Bilirubin 0.2 - 1.2 mg/dL 0.4 - 0.5  Alkaline Phos 39 - 117 U/L 79 - 72  AST 0 - 37 U/L 20 - 25  ALT 0 - 35 U/L 13 - 20     RADIOGRAPHIC STUDIES:  No results found.  ASSESSMENT: 64 year old female   1. Anemia of chronic disease. -Her initial workup showed normal ferritin and serum iron level, slightly low iron saturation, normal U57 and folic acid. Normal erythropoietin level. SPEP was negative.  -Given her multiple comorbidities, especially diabetes, this is likely anemia of chronic disease. -She responded well to Aranesp injection, and has not required any treatment since 2013. -Her hemoglobin today is 11.1, we'll continue observation, no need for Aranesp injection. -We reviewed her ferritin and iron level today, results are pending. We'll follow  2. Diabetes, hypertension, A. fib on Coumadin, morbid RBC -She will continue follow-up with her primary care physician, endocrinologist and cardiologist.  Follow-up I'll see her back in 4-5 months with repeated CBC. If blood counts stable, I'll see her every 6 months.   I  spent 20 minutes counseling the patient face to face. The total time spent in the appointment was 30 minutes.  [name]  09/24/2014, 10:50 AM     Addendum Ferritin 65, transferrin saturation 17%. I recommend pt to take ferrous sulfate 343m bid, will check iron study on next visit.   FTruitt Merle

## 2014-09-24 NOTE — Telephone Encounter (Signed)
Gave patient avs report and appointments for October  °

## 2014-09-27 MED ORDER — FERROUS SULFATE 325 (65 FE) MG PO TBEC: 325.0000 mg | DELAYED_RELEASE_TABLET | Freq: Two times a day (BID) | ORAL | Status: DC

## 2014-09-27 NOTE — Addendum Note (Signed)
Addended by: Malachy MoodFENG, Antar Milks on: 09/27/2014 04:51 PM   Modules accepted: Orders

## 2014-09-28 ENCOUNTER — Telehealth: Payer: Self-pay | Admitting: *Deleted

## 2014-09-28 NOTE — Telephone Encounter (Signed)
Called & spoke to pt & informed that Dr Mosetta PuttFeng wants her to take ferrous sulfate bid & script was sent electronically to her pharmacy.  Her Ferritin was 65. Pt expressed understanding.

## 2014-09-29 ENCOUNTER — Ambulatory Visit (INDEPENDENT_AMBULATORY_CARE_PROVIDER_SITE_OTHER): Payer: Medicare Other | Admitting: Surgery

## 2014-09-29 ENCOUNTER — Encounter: Payer: Self-pay | Admitting: Endocrinology

## 2014-09-29 ENCOUNTER — Ambulatory Visit (INDEPENDENT_AMBULATORY_CARE_PROVIDER_SITE_OTHER): Payer: Medicare Other | Admitting: Endocrinology

## 2014-09-29 VITALS — BP 128/82 | HR 95 | Temp 98.3°F | Resp 19 | Wt 264.0 lb

## 2014-09-29 DIAGNOSIS — E119 Type 2 diabetes mellitus without complications: Secondary | ICD-10-CM | POA: Diagnosis not present

## 2014-09-29 DIAGNOSIS — I10 Essential (primary) hypertension: Secondary | ICD-10-CM

## 2014-09-29 DIAGNOSIS — I4891 Unspecified atrial fibrillation: Secondary | ICD-10-CM

## 2014-09-29 DIAGNOSIS — Z5181 Encounter for therapeutic drug level monitoring: Secondary | ICD-10-CM

## 2014-09-29 LAB — POCT INR: INR: 1.8

## 2014-09-29 LAB — HEMOGLOBIN A1C: Hgb A1c MFr Bld: 6.3 % (ref 4.6–6.5)

## 2014-09-29 NOTE — Progress Notes (Signed)
Patient ID: Megan Fischer, female   DOB: 10/23/1950, 64 y.o.   MRN: [id number]   Reason for Appointment: Diabetes follow-up   History of Present Illness   Diagnosis: Type 2 DIABETES MELITUS, date of diagnosis:  2000     Previous history: She has been on various regimens of treatment in the past including Byetta and generally has done better with GLP-1 drugs She continues to take low-dose glimepiride which helps with overall control. Usually blood sugars are not variable  Recent history:  She has been on Victoza since at least 2013 with good control, now taking 1.2 mg in the mornings daily She thinks that she gets diarrhea when she takes metformin at the first meal of the day but has not reported this previously Also she thinks that she may still have occasional low normal blood sugars with symptoms but less if she takes Victoza in the morning instead of evening However there is no consistent hypoglycemia documented and lowest reading is 68 at 9 AM. She continues to take 3 tablets of metformin ER, one in the morning and 2 in the evening Because of occasional hypoglycemia her Amaryl was stopped in 03/2014  A1c is about the same at 6.8 Her weight hasnot come down even though she thinks she is more active now and trying to walk a little However she thinks she has been "eating on the run" and not watching her diet consistently including drink some sweet tea which she normally does not do  Oral hypoglycemic drugs: Metformin       Side effects from medications: None Monitors blood glucose: once or twicea day.    Glucometer:  One Touch     Blood Glucose readings from download show fasting readings ranging from 73-137 Nonfasting 68-179 but mostly checking before supper and in the afternoon Overall median reading 103    Meals: 2-3 meals per day.          Physical activity: exercise: able to walk some because of leg pain           Dietician visit: Most recent: 2005   Wt Readings  from Last 3 Encounters:  09/29/14 264 lb (119.75 kg)  09/24/14 268 lb 3.2 oz (121.655 kg)  06/30/14 263 lb (119.296 kg)   Lab Results  Component Value Date   HGBA1C 6.3 09/29/2014   HGBA1C 6.8* 06/25/2014   HGBA1C 6.7* 03/26/2014   Lab Results  Component Value Date   MICROALBUR 1.2 06/25/2014   LDLCALC 90 06/25/2014   CREATININE 1.1 09/24/2014      Medication List       This list is accurate as of: 09/29/14  9:24 PM.  Always use your most recent med list.               benazepril-hydrochlorthiazide 20-12.5 MG per tablet  Commonly known as:  LOTENSIN HCT  Take 1 tablet by mouth daily.     CALCIUM PO  Take by mouth 2 (two) times daily.     cloNIDine 0.2 mg/24hr patch  Commonly known as:  CATAPRES-TTS-2  Place 1 patch (0.2 mg total) onto the skin once a week.     CRESTOR 10 MG tablet  Generic drug:  rosuvastatin  TAKE ONE TABLET BY MOUTH ONCE DAILY     diltiazem 240 MG 24 hr capsule  Commonly known as:  CARDIZEM CD  Take 1 capsule (240 mg total) by mouth daily.     diltiazem 240 MG 24 hr capsule  Commonly known as:  CARTIA XT  Take 1 capsule (240 mg total) by mouth daily.     ergocalciferol 50000 UNITS capsule  Commonly known as:  VITAMIN D2  Take 50,000 Units by mouth once a week.     ferrous sulfate 325 (65 FE) MG EC tablet  Take 1 tablet (325 mg total) by mouth 2 (two) times daily.     glucose blood test strip  Commonly known as:  ONETOUCH VERIO  Use as instructed to check blood sugar 2 times per day dx code 250.02     Insulin Pen Needle 32G X 4 MM Misc  Commonly known as:  BD PEN NEEDLE NANO U/F  Use one per day to inject Victoza     Liraglutide 18 MG/3ML Sopn  Commonly known as:  VICTOZA  Inject 1.2 mg daily     metFORMIN 500 MG 24 hr tablet  Commonly known as:  GLUCOPHAGE-XR  Take 1 tablet (500 mg total) by mouth 3 (three) times daily.     metoprolol succinate 100 MG 24 hr tablet  Commonly known as:  TOPROL-XL  TAKE ONE TABLET BY MOUTH  ONCE DAILY. TAKE WITH OR IMMEDIATELY FOLLOWING A MEAL.     ONETOUCH DELICA LANCETS FINE Misc  Use to obtain a blood specimen 2 times per day dx code 250.02     ONETOUCH VERIO IQ SYSTEM W/DEVICE Kit  Use to check blood sugar 2 times per day dx code 250.02     warfarin 5 MG tablet  Commonly known as:  COUMADIN  TAKE AS DIRECTED BY  COUMADIN  CLINIC        Allergies:  Allergies  Allergen Reactions  . Codeine   . Oxycodone Hcl   . Penicillins     Past Medical History  Diagnosis Date  . Obesities, morbid   . Atrial fibrillation   . Hypertension   . Dyslipidemia   . Diabetes mellitus   . Osteoarthritis   . Chronic disease anemia 03/29/2011    Past Surgical History  Procedure Laterality Date  . Cholecystectomy    . Tubal ligation    . Replacement total knee bilateral    . Shoulder surgery      RIGHT SHOULDER    Family History  Problem Relation Age of Onset  . Heart attack Mother   . Stroke Mother   . Diabetes Mother   . Hypertension Brother     Social History:  reports that she has never smoked. She does not have any smokeless tobacco history on file. She reports that she does not drink alcohol or use illicit drugs.  Review of Systems:  History of atrial fibrillation: She is on Coumadin and followed by cardiologist and is due for an appointment  Hypertension:  she has required multiple medications and blood pressure is better on this visit  higher.  Her Tenex was changed because of insurance denial to clonidine patch which she is using without side effects.   Previously taking clonidine in the form of 2 mg patch. However pharmacy may have failed a previous prescription for tablets and she is now taking clonidine 0.1 mg in the morning only  Previously had nausea with clonidine tablets She is also taking diltiazem, metoprolol and benazepril HCT  Lipids: Generally well controlled with Crestor and she is compliant with this.  Lab Results  Component Value Date    CHOL 180 06/25/2014   HDL 73.10 06/25/2014   LDLCALC 90 06/25/2014   TRIG 87.0 06/25/2014  CHOLHDL 2 06/25/2014    History of goiter, long-standing which is euthyroid, tends to have relatively low TSH without history of hyperthyroidism, TSH lately normal   Lab Results  Component Value Date   FREET4 1.13 06/25/2014   FREET4 0.97 09/16/2013   FREET4 1.17 01/20/2013   TSH 0.41 06/25/2014   TSH 0.30* 09/16/2013   TSH 0.31* 01/20/2013   History of anemia followed by hematologist    Examination:   BP 128/82 mmHg  Pulse 95  Temp(Src) 98.3 F (36.8 C) (Oral)  Resp 19  Wt 264 lb (119.75 kg)  SpO2 94%  Body mass index is 46.78 kg/(m^2).     Ankle edema absent    ASSESSMENT/ PLAN:   Diabetes type 2   Blood glucose control is fairly good and A1c is under 7% again She has minimal increase in her blood sugars after meals and she thinks this is from not being compliant with her diet consistently Currently she is taking Victoza and 1500 mg metformin She now thinks that she is getting some diarrhea in the mornings, most likely this is from metformin instead of Victoza which she thinks is causing the loose stools Her weight has leveled off but is not able to lose weight as yet She can continue taking the Victoza in the in the morning as she thinks this works better for her  HYPERTENSION: Good control However discussed that she needs to take clonidine twice a day instead of just once a day If she has any nausea she will go back to the patch Also she will follow-up with PCP next month   Megan Fischer 09/29/2014, 9:24 PM

## 2014-09-29 NOTE — Patient Instructions (Addendum)
Metfomin 1 in am and pm  Clonidine 2x daily   Check blood sugars on waking up ..  .. times a week Also check blood sugars about 2 hours after a meal and do this after different meals by rotation  Recommended blood sugar levels on waking up is 90-130 and about 2 hours after meal is 140-180 Please bring blood sugar monitor to each visit.

## 2014-10-13 ENCOUNTER — Ambulatory Visit (INDEPENDENT_AMBULATORY_CARE_PROVIDER_SITE_OTHER): Payer: Medicare Other | Admitting: *Deleted

## 2014-10-13 DIAGNOSIS — Z5181 Encounter for therapeutic drug level monitoring: Secondary | ICD-10-CM | POA: Diagnosis not present

## 2014-10-13 DIAGNOSIS — I4891 Unspecified atrial fibrillation: Secondary | ICD-10-CM

## 2014-10-13 LAB — POCT INR: INR: 2.3

## 2014-11-06 ENCOUNTER — Ambulatory Visit (INDEPENDENT_AMBULATORY_CARE_PROVIDER_SITE_OTHER): Payer: Medicare Other

## 2014-11-06 DIAGNOSIS — Z5181 Encounter for therapeutic drug level monitoring: Secondary | ICD-10-CM

## 2014-11-06 DIAGNOSIS — I4891 Unspecified atrial fibrillation: Secondary | ICD-10-CM | POA: Diagnosis not present

## 2014-11-06 LAB — POCT INR: INR: 2.1

## 2014-11-10 ENCOUNTER — Telehealth: Payer: Self-pay | Admitting: Endocrinology

## 2014-11-10 NOTE — Telephone Encounter (Signed)
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

## 2014-11-11 ENCOUNTER — Other Ambulatory Visit: Payer: Self-pay | Admitting: *Deleted

## 2014-11-11 MED ORDER — ROSUVASTATIN CALCIUM 10 MG PO TABS
10.0000 mg | ORAL_TABLET | Freq: Every day | ORAL | Status: DC
Start: 2014-11-11 — End: 2014-11-16

## 2014-11-11 MED ORDER — INSULIN PEN NEEDLE 32G X 4 MM MISC: Status: DC

## 2014-11-11 MED ORDER — DILTIAZEM HCL ER COATED BEADS 240 MG PO CP24: 240.0000 mg | ORAL_CAPSULE | Freq: Every day | ORAL | Status: DC

## 2014-11-11 MED ORDER — ROSUVASTATIN CALCIUM 10 MG PO TABS: 10.0000 mg | ORAL_TABLET | Freq: Every day | ORAL | Status: DC

## 2014-11-11 MED ORDER — BENAZEPRIL-HYDROCHLOROTHIAZIDE 20-12.5 MG PO TABS: 1.0000 | ORAL_TABLET | Freq: Every day | ORAL | Status: DC

## 2014-11-11 MED ORDER — METFORMIN HCL ER 500 MG PO TB24: 500.0000 mg | ORAL_TABLET | Freq: Three times a day (TID) | ORAL | Status: DC

## 2014-11-11 MED ORDER — WARFARIN SODIUM 5 MG PO TABS: ORAL_TABLET | ORAL | Status: DC

## 2014-11-11 MED ORDER — METOPROLOL SUCCINATE ER 100 MG PO TB24: ORAL_TABLET | ORAL | Status: DC

## 2014-11-11 MED ORDER — GLUCOSE BLOOD VI STRP: ORAL_STRIP | Status: DC

## 2014-11-11 MED ORDER — LIRAGLUTIDE 18 MG/3ML ~~LOC~~ SOPN: PEN_INJECTOR | SUBCUTANEOUS | Status: DC

## 2014-11-11 NOTE — Telephone Encounter (Signed)
rx sent

## 2014-11-16 ENCOUNTER — Other Ambulatory Visit: Payer: Self-pay | Admitting: *Deleted

## 2014-11-16 MED ORDER — LIRAGLUTIDE 18 MG/3ML ~~LOC~~ SOPN: PEN_INJECTOR | SUBCUTANEOUS | Status: DC

## 2014-11-16 MED ORDER — INSULIN PEN NEEDLE 32G X 4 MM MISC: Status: DC

## 2014-11-16 MED ORDER — DILTIAZEM HCL ER COATED BEADS 240 MG PO CP24: 240.0000 mg | ORAL_CAPSULE | Freq: Every day | ORAL | Status: DC

## 2014-11-16 MED ORDER — METOPROLOL SUCCINATE ER 100 MG PO TB24: ORAL_TABLET | ORAL | Status: DC

## 2014-11-16 MED ORDER — BENAZEPRIL-HYDROCHLOROTHIAZIDE 20-12.5 MG PO TABS: 1.0000 | ORAL_TABLET | Freq: Every day | ORAL | Status: DC

## 2014-11-16 MED ORDER — ROSUVASTATIN CALCIUM 10 MG PO TABS: 10.0000 mg | ORAL_TABLET | Freq: Every day | ORAL | Status: DC

## 2014-12-08 ENCOUNTER — Ambulatory Visit (INDEPENDENT_AMBULATORY_CARE_PROVIDER_SITE_OTHER): Payer: Medicare Other | Admitting: *Deleted

## 2014-12-08 DIAGNOSIS — Z5181 Encounter for therapeutic drug level monitoring: Secondary | ICD-10-CM | POA: Diagnosis not present

## 2014-12-08 DIAGNOSIS — I4891 Unspecified atrial fibrillation: Secondary | ICD-10-CM | POA: Diagnosis not present

## 2014-12-08 LAB — POCT INR: INR: 2.3

## 2015-01-19 ENCOUNTER — Ambulatory Visit (INDEPENDENT_AMBULATORY_CARE_PROVIDER_SITE_OTHER): Payer: Medicare Other | Admitting: *Deleted

## 2015-01-19 DIAGNOSIS — Z5181 Encounter for therapeutic drug level monitoring: Secondary | ICD-10-CM | POA: Diagnosis not present

## 2015-01-19 DIAGNOSIS — I4891 Unspecified atrial fibrillation: Secondary | ICD-10-CM

## 2015-01-19 LAB — POCT INR: INR: 1.9

## 2015-02-01 ENCOUNTER — Other Ambulatory Visit: Payer: Self-pay | Admitting: *Deleted

## 2015-02-01 ENCOUNTER — Encounter: Payer: Self-pay | Admitting: Endocrinology

## 2015-02-01 ENCOUNTER — Ambulatory Visit (INDEPENDENT_AMBULATORY_CARE_PROVIDER_SITE_OTHER): Payer: Medicare Other | Admitting: Endocrinology

## 2015-02-01 VITALS — BP 138/82 | HR 81 | Temp 98.5°F | Resp 16 | Ht 63.0 in | Wt 262.0 lb

## 2015-02-01 DIAGNOSIS — Z23 Encounter for immunization: Secondary | ICD-10-CM | POA: Diagnosis not present

## 2015-02-01 DIAGNOSIS — E119 Type 2 diabetes mellitus without complications: Secondary | ICD-10-CM | POA: Diagnosis not present

## 2015-02-01 DIAGNOSIS — I10 Essential (primary) hypertension: Secondary | ICD-10-CM

## 2015-02-01 DIAGNOSIS — IMO0002 Reserved for concepts with insufficient information to code with codable children: Secondary | ICD-10-CM

## 2015-02-01 DIAGNOSIS — E1165 Type 2 diabetes mellitus with hyperglycemia: Secondary | ICD-10-CM

## 2015-02-01 LAB — POCT GLYCOSYLATED HEMOGLOBIN (HGB A1C): Hemoglobin A1C: 6.2

## 2015-02-01 NOTE — Progress Notes (Signed)
Patient ID: Megan Fischer, female   DOB: 04/30/1951, 64 y.o.   MRN: [id number]   Reason for Appointment: Diabetes follow-up   History of Present Illness   Diagnosis: Type 2 DIABETES MELITUS, date of diagnosis:  2000     Previous history: She has been on various regimens of treatment in the past including Byetta and generally has done better with GLP-1 drugs She continues to take low-dose glimepiride which helps with overall control. Usually blood sugars are not variable  Recent history:  She has been on Victoza since at least 2013 with good control,  taking 1.2 mg in the mornings daily  Current blood sugar patterns and problems identified:  She hasn't complained about diarrhea recently, taking 1500 mg of metformin ER with one in the morning  She has 1 asymptomatic blood sugar of 69 and also a reading of 48 at about 2 PM when she was feeling a little confused.  She did not think she missed a meal  Her blood sugar readings  are on an average about 100 throughout the day but may be slightly lower in the afternoon  She does not have any significant high blood sugars except when she was off the diet, has only one high reading of 164 Because of occasional hypoglycemia her Amaryl was stopped in 03/2014  A1c is about the same at 6.2 but somewhat better than in 2/16 Her weight has come down a couple of pounds  Oral hypoglycemic drugs: Metformin       Side effects from medications: None Monitors blood glucose: once or twice a day.    Glucometer:  One Touch      Mean values apply above for all meters except median for One Touch  PRE-MEAL Fasting Lunch  afternoon  Bedtime Overall  Glucose range: 78-147   48-163   100-121    Mean/median:  99      100       Meals: 2-3 meals per day.          Physical activity: exercise: able to walk some because of leg pain           Dietician visit: Most recent: 2005   Wt Readings from Last 3 Encounters:  02/01/15 262 lb (118.842 kg)    09/29/14 264 lb (119.75 kg)  09/24/14 268 lb 3.2 oz (121.655 kg)   Lab Results  Component Value Date   HGBA1C 6.2 02/01/2015   HGBA1C 6.3 09/29/2014   HGBA1C 6.8* 06/25/2014   Lab Results  Component Value Date   MICROALBUR 1.2 06/25/2014   LDLCALC 90 06/25/2014   CREATININE 1.1 09/24/2014      Medication List       This list is accurate as of: 02/01/15  9:21 AM.  Always use your most recent med list.               benazepril-hydrochlorthiazide 20-12.5 MG per tablet  Commonly known as:  LOTENSIN HCT  Take 1 tablet by mouth daily.     CALCIUM PO  Take by mouth 2 (two) times daily.     cloNIDine 0.2 mg/24hr patch  Commonly known as:  CATAPRES-TTS-2  Place 1 patch (0.2 mg total) onto the skin once a week.     diltiazem 240 MG 24 hr capsule  Commonly known as:  CARDIZEM CD  Take 1 capsule (240 mg total) by mouth daily.     ergocalciferol 50000 UNITS capsule  Commonly known as:  VITAMIN D2  Take 50,000 Units by mouth once a week.     ferrous sulfate 325 (65 FE) MG EC tablet  Take 1 tablet (325 mg total) by mouth 2 (two) times daily.     glucose blood test strip  Commonly known as:  ONETOUCH VERIO  Use as instructed to check blood sugar 2 times per day dx code E11.65     Insulin Pen Needle 32G X 4 MM Misc  Commonly known as:  BD PEN NEEDLE NANO U/F  Use one per day to inject Victoza     Liraglutide 18 MG/3ML Sopn  Commonly known as:  VICTOZA  Inject 1.2 mg daily     metFORMIN 500 MG 24 hr tablet  Commonly known as:  GLUCOPHAGE-XR  Take 1 tablet (500 mg total) by mouth 3 (three) times daily.     metoprolol succinate 100 MG 24 hr tablet  Commonly known as:  TOPROL-XL  TAKE ONE TABLET BY MOUTH ONCE DAILY. TAKE WITH OR IMMEDIATELY FOLLOWING A MEAL.     ONETOUCH DELICA LANCETS FINE Misc  Use to obtain a blood specimen 2 times per day dx code 250.02     ONETOUCH VERIO IQ SYSTEM W/DEVICE Kit  Use to check blood sugar 2 times per day dx code 250.02      rosuvastatin 10 MG tablet  Commonly known as:  CRESTOR  Take 1 tablet (10 mg total) by mouth daily.     warfarin 5 MG tablet  Commonly known as:  COUMADIN  TAKE AS DIRECTED BY  COUMADIN  CLINIC        Allergies:  Allergies  Allergen Reactions  . Codeine   . Oxycodone Hcl   . Penicillins     Past Medical History  Diagnosis Date  . Obesities, morbid   . Atrial fibrillation   . Hypertension   . Dyslipidemia   . Diabetes mellitus   . Osteoarthritis   . Chronic disease anemia 03/29/2011    Past Surgical History  Procedure Laterality Date  . Cholecystectomy    . Tubal ligation    . Replacement total knee bilateral    . Shoulder surgery      RIGHT SHOULDER    Family History  Problem Relation Age of Onset  . Heart attack Mother   . Stroke Mother   . Diabetes Mother   . Hypertension Brother     Social History:  reports that she has never smoked. She does not have any smokeless tobacco history on file. She reports that she does not drink alcohol or use illicit drugs.  Review of Systems:  History of atrial fibrillation: She is on Coumadin and followed by cardiologist and is due for an appointment  Hypertension:  she has required multiple medications and blood pressure is better on this visit   Previously had nausea with clonidine tablets and is doing better with clonidine patch which she is able to get now without insurance problems She is also taking diltiazem, metoprolol and benazepril HCT  Lipids: Generally well controlled with Crestor and she is compliant with this.  Lab Results  Component Value Date   CHOL 180 06/25/2014   HDL 73.10 06/25/2014   LDLCALC 90 06/25/2014   TRIG 87.0 06/25/2014   CHOLHDL 2 06/25/2014    History of goiter, long-standing which is euthyroid, tends to have relatively low TSH without history of hyperthyroidism TSH done by PCP was 0.334 in June   Lab Results  Component Value Date   FREET4 1.13 06/25/2014  FREET4 0.97  09/16/2013   FREET4 1.17 01/20/2013   TSH 0.41 06/25/2014   TSH 0.30* 09/16/2013   TSH 0.31* 01/20/2013   History of anemia followed by hematologist    Examination:   BP 122/78 mmHg  Pulse 81  Temp(Src) 98.5 F (36.9 C)  Resp 16  Ht 5' 3"  (1.6 m)  Wt 262 lb (118.842 kg)  BMI 46.42 kg/m2  SpO2 94%  Body mass index is 46.42 kg/(m^2).    Repeat blood pressure 138/82  Ankle edema not present  ASSESSMENT/ PLAN:   Diabetes type 2   Blood glucose control is fairly good and A1c is under 7% consistently She has been fairly stable blood sugar throughout the day with metformin and Victoza She has had sporadic low blood sugars in the afternoon This is without taking any hypoglycemic drugs like Amaryl For now will stop her metformin in the morning She is doing fairly well with diet and her weight is stable or slightly better  HYPERTENSION: Good control and she can continue same regimen  Influenza vaccine given   [facility] 02/01/2015, 9:21 AM

## 2015-02-01 NOTE — Patient Instructions (Signed)
STOP METFORMIN IN AM

## 2015-02-16 ENCOUNTER — Ambulatory Visit (INDEPENDENT_AMBULATORY_CARE_PROVIDER_SITE_OTHER): Payer: Medicare Other | Admitting: *Deleted

## 2015-02-16 DIAGNOSIS — I4891 Unspecified atrial fibrillation: Secondary | ICD-10-CM

## 2015-02-16 DIAGNOSIS — Z5181 Encounter for therapeutic drug level monitoring: Secondary | ICD-10-CM

## 2015-02-16 LAB — POCT INR: INR: 1.8

## 2015-02-18 ENCOUNTER — Telehealth: Payer: Self-pay | Admitting: Hematology

## 2015-02-18 ENCOUNTER — Encounter: Payer: Self-pay | Admitting: Hematology

## 2015-02-18 ENCOUNTER — Other Ambulatory Visit: Payer: Self-pay | Admitting: *Deleted

## 2015-02-18 ENCOUNTER — Encounter: Payer: Medicare Other | Admitting: Hematology

## 2015-02-18 ENCOUNTER — Other Ambulatory Visit: Payer: Medicare Other

## 2015-02-18 NOTE — Progress Notes (Signed)
No show  This encounter was created in error - please disregard.

## 2015-02-18 NOTE — Telephone Encounter (Signed)
r/s appt per pof-tried to call pt no anser no voicemail-mailed copy of r/s appt

## 2015-02-22 ENCOUNTER — Other Ambulatory Visit: Payer: Self-pay | Admitting: Endocrinology

## 2015-02-22 ENCOUNTER — Other Ambulatory Visit: Payer: Self-pay | Admitting: Cardiovascular Disease

## 2015-03-04 ENCOUNTER — Telehealth: Payer: Self-pay | Admitting: *Deleted

## 2015-03-04 ENCOUNTER — Other Ambulatory Visit: Payer: Medicare Other

## 2015-03-04 ENCOUNTER — Encounter: Payer: Self-pay | Admitting: Hematology

## 2015-03-04 ENCOUNTER — Encounter: Payer: Medicare Other | Admitting: Hematology

## 2015-03-04 NOTE — Telephone Encounter (Signed)
Missed appt today.  Attempted to call pt at home # & no answer.  Reached pt's husband on cell # & he will have pt return call to r/s missed appt.

## 2015-03-04 NOTE — Progress Notes (Signed)
No show  This encounter was created in error - please disregard.

## 2015-03-12 ENCOUNTER — Ambulatory Visit (INDEPENDENT_AMBULATORY_CARE_PROVIDER_SITE_OTHER): Payer: Medicare Other | Admitting: *Deleted

## 2015-03-12 DIAGNOSIS — I4891 Unspecified atrial fibrillation: Secondary | ICD-10-CM

## 2015-03-12 DIAGNOSIS — Z5181 Encounter for therapeutic drug level monitoring: Secondary | ICD-10-CM

## 2015-03-12 LAB — POCT INR: INR: 2.4

## 2015-04-09 ENCOUNTER — Ambulatory Visit (INDEPENDENT_AMBULATORY_CARE_PROVIDER_SITE_OTHER): Payer: Medicare Other | Admitting: *Deleted

## 2015-04-09 DIAGNOSIS — Z5181 Encounter for therapeutic drug level monitoring: Secondary | ICD-10-CM | POA: Diagnosis not present

## 2015-04-09 DIAGNOSIS — I4891 Unspecified atrial fibrillation: Secondary | ICD-10-CM | POA: Diagnosis not present

## 2015-04-09 LAB — POCT INR: INR: 2.5

## 2015-05-12 ENCOUNTER — Ambulatory Visit (INDEPENDENT_AMBULATORY_CARE_PROVIDER_SITE_OTHER): Payer: Medicare Other | Admitting: *Deleted

## 2015-05-12 DIAGNOSIS — I4891 Unspecified atrial fibrillation: Secondary | ICD-10-CM | POA: Diagnosis not present

## 2015-05-12 DIAGNOSIS — Z5181 Encounter for therapeutic drug level monitoring: Secondary | ICD-10-CM | POA: Diagnosis not present

## 2015-05-12 LAB — POCT INR: INR: 1.9

## 2015-06-04 ENCOUNTER — Ambulatory Visit (INDEPENDENT_AMBULATORY_CARE_PROVIDER_SITE_OTHER): Payer: Medicare Other | Admitting: Endocrinology

## 2015-06-04 VITALS — BP 122/78 | HR 75 | Temp 97.7°F | Resp 16 | Ht 63.0 in | Wt 256.2 lb

## 2015-06-04 DIAGNOSIS — E1165 Type 2 diabetes mellitus with hyperglycemia: Secondary | ICD-10-CM

## 2015-06-04 DIAGNOSIS — E118 Type 2 diabetes mellitus with unspecified complications: Secondary | ICD-10-CM | POA: Diagnosis not present

## 2015-06-04 DIAGNOSIS — IMO0002 Reserved for concepts with insufficient information to code with codable children: Secondary | ICD-10-CM

## 2015-06-04 DIAGNOSIS — E042 Nontoxic multinodular goiter: Secondary | ICD-10-CM | POA: Diagnosis not present

## 2015-06-04 DIAGNOSIS — E785 Hyperlipidemia, unspecified: Secondary | ICD-10-CM

## 2015-06-04 LAB — POCT GLYCOSYLATED HEMOGLOBIN (HGB A1C): Hemoglobin A1C: 6.7

## 2015-06-04 NOTE — Progress Notes (Addendum)
Patient ID: Megan Fischer, female   DOB: 25-Mar-1951, 65 y.o.   MRN: [id number]   Reason for Appointment: Diabetes follow-up   History of Present Illness   Diagnosis: Type 2 DIABETES MELITUS, date of diagnosis:  2000     Previous history: Megan Fischer has been on various regimens of treatment in the past including Byetta and generally has done better with GLP-1 drugs Megan Fischer continues to take low-dose glimepiride which helps with overall control. Usually blood sugars are not variable  Recent history:  Megan Fischer has been on Victoza since at least 2013 with good control,  taking 1.2 mg in the mornings daily Because of occasional hypoglycemia her Amaryl was stopped in 03/2014 On her last visit metformin was stopped in the morning because of tendency to low sugars in the afternoon  Current blood sugar patterns and problems identified:  Megan Fischer has overall fairly good control with A1c 6.7, previously 6.2 but also was having low normal or low sugars in the afternoon before.  No further hypoglycemia with reducing metformin to 1000 mg a day in the evenings  Megan Fischer has lost some weight and is trying to be a little more active also  Fasting blood sugars are relatively higher than before but still averaging about 125  Has had only one relatively high readings in the evening recently, however not checking postprandial readings usually  Oral hypoglycemic drugs: Metformin ER 500 mg, 2 tablets at dinner       Side effects from medications: None Monitors blood glucose: once or twice a day.    Glucometer:  One Touch     Mean values apply above for all meters except median for One Touch  PRE-MEAL Fasting Lunch Dinner PCS Overall  Glucose range: 107-154  103-146 97-174   Mean/median: 124  123  123+/-19      Meals: 2-3 meals per day.          Physical activity: exercise: able to walk some  Dietician visit: Most recent: 2005   Wt Readings from Last 3 Encounters:  06/04/15 256 lb 3.7 oz (116.225 kg)    02/01/15 262 lb (118.842 kg)  09/29/14 264 lb (119.75 kg)   Lab Results  Component Value Date   HGBA1C 6.7 06/04/2015   HGBA1C 6.2 02/01/2015   HGBA1C 6.3 09/29/2014   Lab Results  Component Value Date   MICROALBUR 1.2 06/25/2014   LDLCALC 90 06/25/2014   CREATININE 1.1 09/24/2014      Medication List       This list is accurate as of: 06/04/15  9:42 AM.  Always use your most recent med list.               benazepril-hydrochlorthiazide 20-12.5 MG tablet  Commonly known as:  LOTENSIN HCT  Take 1 tablet by mouth  daily     CALCIUM PO  Take by mouth 2 (two) times daily.     cloNIDine 0.2 mg/24hr patch  Commonly known as:  CATAPRES-TTS-2  Place 1 patch (0.2 mg total) onto the skin once a week.     diltiazem 240 MG 24 hr capsule  Commonly known as:  CARDIZEM CD  Take 1 capsule by mouth  daily     ergocalciferol 50000 units capsule  Commonly known as:  VITAMIN D2  Take 50,000 Units by mouth once a week.     ferrous sulfate 325 (65 FE) MG EC tablet  Take 1 tablet (325 mg total) by mouth 2 (two) times daily.  Insulin Pen Needle 32G X 4 MM Misc  Commonly known as:  BD PEN NEEDLE NANO U/F  Use one per day to inject Victoza     BD PEN NEEDLE NANO U/F 32G X 4 MM Misc  Generic drug:  Insulin Pen Needle  Use one per day to inject  Victoza     metFORMIN 500 MG 24 hr tablet  Commonly known as:  GLUCOPHAGE-XR  Take 1 tablet by mouth 3  times daily     metoprolol succinate 100 MG 24 hr tablet  Commonly known as:  TOPROL-XL  TAKE ONE TABLET BY MOUTH  ONCE DAILY. TAKE WITH OR  IMMEDIATELY FOLLOWING A  MEAL.     ONETOUCH DELICA LANCETS FINE Misc  Use to obtain a blood specimen 2 times per day dx code 250.02     ONETOUCH VERIO IQ SYSTEM w/Device Kit  Use to check blood sugar 2 times per day dx code 250.02     ONETOUCH VERIO test strip  Generic drug:  glucose blood  Use as directed check blood sugar two times daily     rosuvastatin 10 MG tablet  Commonly known  as:  CRESTOR  Take 1 tablet by mouth  daily     VICTOZA 18 MG/3ML Sopn  Generic drug:  Liraglutide  Inject subcutaneously 1.53m daily     warfarin 5 MG tablet  Commonly known as:  COUMADIN  Take as directed by  Coumadin Clinic        Allergies:  Allergies  Allergen Reactions  . Codeine   . Oxycodone Hcl   . Penicillins   . Chlorhexidine Gluconate Rash    Past Medical History  Diagnosis Date  . [place] morbid [place]   . [place]   . Hypertension   . Dyslipidemia   . Diabetes mellitus   . Osteoarthritis   . Chronic disease anemia 03/29/2011    Past Surgical History  Procedure Laterality Date  . Cholecystectomy    . Tubal ligation    . Replacement total knee bilateral    . Shoulder surgery      RIGHT SHOULDER    Family History  Problem Relation Age of Onset  . Heart attack Mother   . Stroke Mother   . Diabetes Mother   . Hypertension Brother     Social History:  reports that Megan Fischer has never smoked. Megan Fischer does not have any smokeless tobacco history on file. Megan Fischer reports that Megan Fischer does not drink alcohol or use illicit drugs.  Review of Systems:  History of atrial fibrillation: Megan Fischer is on Coumadin and followed by cardiologist  Hypertension:  Megan Fischer has required multiple medications and blood pressure is controlled Tolerating clonidine Megan Fischer is also taking diltiazem, metoprolol and benazepril HCT  Lipids: Generally well controlled with Crestor and Megan Fischer is compliant with this.  Lab Results  Component Value Date   CHOL 180 06/25/2014   HDL 73.10 06/25/2014   LDLCALC 90 06/25/2014   TRIG 87.0 06/25/2014   CHOLHDL 2 06/25/2014    History of goiter, long-standing which is euthyroid, tends to have relatively low TSH without history of hyperthyroidism TSH done by PCP was 0.334 in June 2016   Lab Results  Component Value Date   FREET4 1.13 06/25/2014   FREET4 0.97 09/16/2013   FREET4 1.17 01/20/2013   TSH 0.41 06/25/2014   TSH 0.30* 09/16/2013     TSH 0.31* 01/20/2013   History of anemia followed by hematologist    Examination:  BP 122/78 mmHg  Pulse 75  Temp(Src) 97.7 F (36.5 C)  Resp 16  Ht 5' 3"  (1.6 m)  Wt 256 lb 3.7 oz (116.225 kg)  BMI 45.40 kg/m2  SpO2 97%  Body mass index is 45.4 kg/(m^2).     Ankle edema not present, has mild puffiness of the left lower leg  Diabetic Foot Exam - Simple   Simple Foot Form  Diabetic Foot exam was performed with the following findings:  Yes 06/04/2015  9:03 AM  Visual Inspection  No deformities, no ulcerations, no other skin breakdown bilaterally:  Yes  Sensation Testing  Intact to touch and monofilament testing bilaterally:  Yes  Pulse Check  Posterior Tibialis and Dorsalis pulse intact bilaterally:  Yes  Comments       ASSESSMENT/ PLAN:   Diabetes type 2   Blood glucose control is fairly good and A1c is under 7% consistently Megan Fischer has been fairly stable blood sugar throughout the day with metformin and Victoza Megan Fischer has not had any low sugars with reducing metformin to 2 tablets However has relatively higher fasting readings and A1c is somewhat higher Megan Fischer can try taking 1500 mg   of metformin in the evening together  Megan Fischer is doing fairly well with diet and her weight is slightly better  HYPERTENSION: Good control and Megan Fischer can continue same regimen  GOITER: Will do follow-up labs on the next visit  Ameyah Bangura 06/04/2015, 9:42 AM

## 2015-06-04 NOTE — Patient Instructions (Signed)
Check blood sugars on waking up 2-3 times a week Also check blood sugars about 2 hours after a meal and do this after different meals by rotation  Recommended blood sugar levels on waking up is 90-130 and about 2 hours after meal is 130-160  Please bring your blood sugar monitor to each visit, thank you  

## 2015-06-09 ENCOUNTER — Ambulatory Visit (INDEPENDENT_AMBULATORY_CARE_PROVIDER_SITE_OTHER): Payer: Medicare Other | Admitting: *Deleted

## 2015-06-09 DIAGNOSIS — I4891 Unspecified atrial fibrillation: Secondary | ICD-10-CM | POA: Diagnosis not present

## 2015-06-09 DIAGNOSIS — Z5181 Encounter for therapeutic drug level monitoring: Secondary | ICD-10-CM

## 2015-06-09 LAB — POCT INR: INR: 2.9

## 2015-07-14 ENCOUNTER — Ambulatory Visit (INDEPENDENT_AMBULATORY_CARE_PROVIDER_SITE_OTHER): Payer: Medicare Other | Admitting: *Deleted

## 2015-07-14 ENCOUNTER — Encounter (INDEPENDENT_AMBULATORY_CARE_PROVIDER_SITE_OTHER): Payer: Self-pay

## 2015-07-14 DIAGNOSIS — Z5181 Encounter for therapeutic drug level monitoring: Secondary | ICD-10-CM

## 2015-07-14 DIAGNOSIS — I4891 Unspecified atrial fibrillation: Secondary | ICD-10-CM

## 2015-07-14 LAB — POCT INR: INR: 1.9

## 2015-08-11 ENCOUNTER — Ambulatory Visit (INDEPENDENT_AMBULATORY_CARE_PROVIDER_SITE_OTHER): Payer: Medicare Other | Admitting: *Deleted

## 2015-08-11 DIAGNOSIS — Z5181 Encounter for therapeutic drug level monitoring: Secondary | ICD-10-CM | POA: Diagnosis not present

## 2015-08-11 DIAGNOSIS — I4891 Unspecified atrial fibrillation: Secondary | ICD-10-CM

## 2015-08-11 LAB — POCT INR: INR: 2.4

## 2015-08-30 ENCOUNTER — Other Ambulatory Visit (INDEPENDENT_AMBULATORY_CARE_PROVIDER_SITE_OTHER): Payer: Medicare Other

## 2015-08-30 DIAGNOSIS — E119 Type 2 diabetes mellitus without complications: Secondary | ICD-10-CM

## 2015-08-30 DIAGNOSIS — E042 Nontoxic multinodular goiter: Secondary | ICD-10-CM

## 2015-08-30 DIAGNOSIS — IMO0002 Reserved for concepts with insufficient information to code with codable children: Secondary | ICD-10-CM

## 2015-08-30 DIAGNOSIS — E118 Type 2 diabetes mellitus with unspecified complications: Secondary | ICD-10-CM

## 2015-08-30 DIAGNOSIS — E1165 Type 2 diabetes mellitus with hyperglycemia: Secondary | ICD-10-CM | POA: Diagnosis not present

## 2015-08-30 DIAGNOSIS — E785 Hyperlipidemia, unspecified: Secondary | ICD-10-CM | POA: Diagnosis not present

## 2015-08-30 LAB — MICROALBUMIN / CREATININE URINE RATIO
CREATININE, U: 161.6 mg/dL
MICROALB UR: 0.9 mg/dL (ref 0.0–1.9)
MICROALB/CREAT RATIO: 0.6 mg/g (ref 0.0–30.0)

## 2015-08-30 LAB — COMPREHENSIVE METABOLIC PANEL
ALK PHOS: 70 U/L (ref 39–117)
ALT: 13 U/L (ref 0–35)
AST: 18 U/L (ref 0–37)
Albumin: 3.8 g/dL (ref 3.5–5.2)
BUN: 24 mg/dL — ABNORMAL HIGH (ref 6–23)
CALCIUM: 10 mg/dL (ref 8.4–10.5)
CO2: 30 meq/L (ref 19–32)
Chloride: 104 mEq/L (ref 96–112)
Creatinine, Ser: 1.29 mg/dL — ABNORMAL HIGH (ref 0.40–1.20)
GFR: 53.43 mL/min — AB (ref >60.00)
GLUCOSE: 123 mg/dL — AB (ref 70–99)
POTASSIUM: 4.1 meq/L (ref 3.5–5.1)
Sodium: 142 mEq/L (ref 135–145)
TOTAL PROTEIN: 7.2 g/dL (ref 6.0–8.3)
Total Bilirubin: 0.5 mg/dL (ref 0.2–1.2)

## 2015-08-30 LAB — LIPID PANEL
Cholesterol: 149 mg/dL (ref 0–200)
HDL: 62.4 mg/dL (ref >39.00)
LDL CALC: 77 mg/dL (ref 0–99)
NONHDL: 86.81
Total CHOL/HDL Ratio: 2
Triglycerides: 51 mg/dL (ref 0.0–149.0)
VLDL: 10.2 mg/dL (ref 0.0–40.0)

## 2015-08-30 LAB — TSH: TSH: 0.35 u[IU]/mL (ref 0.35–4.50)

## 2015-08-30 LAB — T4, FREE: Free T4: 1.1 ng/dL (ref 0.60–1.60)

## 2015-08-30 LAB — HEMOGLOBIN A1C: HEMOGLOBIN A1C: 7.5 % — AB (ref 4.6–6.5)

## 2015-08-31 ENCOUNTER — Other Ambulatory Visit: Payer: Self-pay | Admitting: Endocrinology

## 2015-08-31 ENCOUNTER — Other Ambulatory Visit: Payer: Self-pay | Admitting: Cardiovascular Disease

## 2015-09-02 ENCOUNTER — Encounter: Payer: Self-pay | Admitting: Endocrinology

## 2015-09-02 ENCOUNTER — Ambulatory Visit (INDEPENDENT_AMBULATORY_CARE_PROVIDER_SITE_OTHER): Payer: Medicare Other | Admitting: Endocrinology

## 2015-09-02 VITALS — BP 136/82 | HR 79 | Temp 98.6°F | Resp 14 | Ht 63.0 in | Wt 253.8 lb

## 2015-09-02 DIAGNOSIS — E1165 Type 2 diabetes mellitus with hyperglycemia: Secondary | ICD-10-CM

## 2015-09-02 MED ORDER — LIRAGLUTIDE 18 MG/3ML ~~LOC~~ SOPN: 1.8000 mg | PEN_INJECTOR | Freq: Every day | SUBCUTANEOUS | Status: DC

## 2015-09-02 NOTE — Patient Instructions (Signed)
Check blood sugars on waking up 3  times a week Also check blood sugars about 2 hours after a meal and do this after different meals by rotation  Recommended blood sugar levels on waking up is 90-130 and about 2 hours after meal is 130-160  Please bring your blood sugar monitor to each visit, thank you  Victoza 1.8mg 

## 2015-09-02 NOTE — Progress Notes (Signed)
Patient ID: Megan Fischer, female   DOB: [birth year], 65 y.o.   MRN: [id number]   Reason for Appointment: Diabetes follow-up   History of Present Illness   Diagnosis: Type 2 DIABETES MELITUS, date of diagnosis:  2000     Previous history: She has been on various regimens of treatment in the past including Byetta and generally has done better with GLP-1 drugs She continues to take low-dose glimepiride which helps with overall control. Usually blood sugars are not variable  Recent history:  She has been on Victoza since at least 2013 with good control,  taking 1.2 mg in the mornings daily Because of occasional hypoglycemia her Amaryl was stopped in 03/2014 On her last visit metformin was increased to 1500 mg since fasting readings are relatively higher  Current blood sugar patterns and problems identified:  She has overall a higher A1c of 7.5, not clear why it is going up since her home readings are averaging about the same  She is still taking her blood sugar at various times of the day but not usually after supper  She is trying to be little more active and has just started going to the [facility] for exercise  She has sporadically high readings around 170-180 but not consistently and based on her meal content  Not checking readings after evening meal typically, difficult to know which readings are after eating  Oral hypoglycemic drugs: Metformin ER 500 mg, 3 tablets at dinner       Side effects from medications: None Monitors blood glucose: once or twice a day.    Glucometer:  One Touch     Mean values apply above for all meters except median for One Touch  PRE-MEAL Fasting Lunch Dinner Bedtime Overall  Glucose range: 102-170  101-178    Mean/median: 127 111 133  128      Meals: 2-3 meals per day. Dinner 5 pm          Physical activity: exercise: Walk, bike, Just starting Dietician visit: Most recent: 2005   Wt Readings from Last 3 Encounters:  09/02/15 253 lb 12.8  oz (115.123 kg)  06/04/15 256 lb 3.7 oz (116.225 kg)  02/01/15 262 lb (118.842 kg)   Lab Results  Component Value Date   HGBA1C 7.5* 08/30/2015   HGBA1C 6.7 06/04/2015   HGBA1C 6.2 02/01/2015   Lab Results  Component Value Date   MICROALBUR 0.9 08/30/2015   LDLCALC 77 08/30/2015   CREATININE 1.29* 08/30/2015      Medication List       This list is accurate as of: 09/02/15 11:01 AM.  Always use your most recent med list.               benazepril-hydrochlorthiazide 20-12.5 MG tablet  Commonly known as:  LOTENSIN HCT  Take 1 tablet by mouth  daily     CALCIUM PO  Take by mouth 2 (two) times daily.     cloNIDine 0.2 mg/24hr patch  Commonly known as:  CATAPRES-TTS-2  Place 1 patch (0.2 mg total) onto the skin once a week.     diltiazem 240 MG 24 hr capsule  Commonly known as:  CARDIZEM CD  Take 1 capsule by mouth  daily     ergocalciferol 50000 units capsule  Commonly known as:  VITAMIN D2  Take 50,000 Units by mouth once a week.     ferrous sulfate 325 (65 FE) MG EC tablet  Take 1 tablet (325 mg total)  by mouth 2 (two) times daily.     Insulin Pen Needle 32G X 4 MM Misc  Commonly known as:  BD PEN NEEDLE NANO U/F  Use one per day to inject Victoza     BD PEN NEEDLE NANO U/F 32G X 4 MM Misc  Generic drug:  Insulin Pen Needle  Use one per day to inject  Victoza     Liraglutide 18 MG/3ML Sopn  Commonly known as:  VICTOZA  Inject 0.3 mLs (1.8 mg total) into the skin daily.     metFORMIN 500 MG 24 hr tablet  Commonly known as:  GLUCOPHAGE-XR  Take 1 tablet by mouth 3  times daily     metoprolol succinate 100 MG 24 hr tablet  Commonly known as:  TOPROL-XL  TAKE ONE TABLET BY MOUTH  ONCE DAILY. TAKE WITH OR  IMMEDIATELY FOLLOWING A  MEAL.     ONETOUCH DELICA LANCETS FINE Misc  Use to obtain a blood specimen 2 times per day dx code 250.02     ONETOUCH VERIO IQ SYSTEM w/Device Kit  Use to check blood sugar 2 times per day dx code 250.02     ONETOUCH VERIO  test strip  Generic drug:  glucose blood  Use as directed check blood sugar two times daily     rosuvastatin 10 MG tablet  Commonly known as:  CRESTOR  Take 1 tablet by mouth  daily     warfarin 5 MG tablet  Commonly known as:  COUMADIN  Take as directed by  Coumadin Clinic        Allergies:  Allergies  Allergen Reactions  . Codeine   . Oxycodone Hcl   . Penicillins   . Chlorhexidine Gluconate Rash    Past Medical History  Diagnosis Date  . Obesities, morbid ([place])   . Atrial fibrillation ([place])   . Hypertension   . Dyslipidemia   . Diabetes mellitus   . Osteoarthritis   . Chronic disease anemia 03/29/2011    Past Surgical History  Procedure Laterality Date  . Cholecystectomy    . Tubal ligation    . Replacement total knee bilateral    . Shoulder surgery      RIGHT SHOULDER    Family History  Problem Relation Age of Onset  . Heart attack Mother   . Stroke Mother   . Diabetes Mother   . Hypertension Brother     Social History:  reports that she has never smoked. She does not have any smokeless tobacco history on file. She reports that she does not drink alcohol or use illicit drugs.  Review of Systems:  History of atrial fibrillation: She is on Coumadin and followed by cardiologist  Hypertension:  she has required multiple medications and blood pressure is Fairly well controlled Tolerating clonidine topically She is also taking diltiazem, metoprolol and benazepril HCT  Lipids:  well controlled with Crestor and she is compliant with this.  Lab Results  Component Value Date   CHOL 149 08/30/2015   HDL 62.40 08/30/2015   LDLCALC 77 08/30/2015   TRIG 51.0 08/30/2015   CHOLHDL 2 08/30/2015    History of goiter, long-standing which is euthyroid, tends to have relatively low TSH without history of hyperthyroidism, This is now normal   Lab Results  Component Value Date   FREET4 1.10 08/30/2015   FREET4 1.13 06/25/2014   FREET4 0.97 09/16/2013    TSH 0.35 08/30/2015   TSH 0.41 06/25/2014   TSH 0.30* 09/16/2013  History of anemia followed by hematologist    Examination:   BP 136/82 mmHg  Pulse 79  Temp(Src) 98.6 F (37 C)  Resp 14  Ht _0  (1.6 m)  Wt 253 lb 12.8 oz (115.123 kg)  BMI 44.97 kg/m2  SpO2 98%  Body mass index is 44.97 kg/(m^2).      ASSESSMENT/ PLAN:   Diabetes type 2 With obesity and BMI 45  Blood glucose control is fairly good with fairly good readings averaging about 130 at home although A1c has gone up to 7.5 but not clear why He thinks she is doing fairly well with her diet May not be checking enough readings after meals especially supper Since she has difficulty losing weight will try 1.8 mg Victoza    HYPERTENSION: Reasonably good control and she can continue same regimen  Mild increase in creatinine, we'll continue to follow   [facility] 09/02/2015, 11:01 AM   Note: This office note was prepared with Dragon voice recognition system technology. Any transcriptional errors that result from this process are unintentional.

## 2015-09-10 ENCOUNTER — Ambulatory Visit (INDEPENDENT_AMBULATORY_CARE_PROVIDER_SITE_OTHER): Payer: Medicare Other | Admitting: *Deleted

## 2015-09-10 DIAGNOSIS — I4891 Unspecified atrial fibrillation: Secondary | ICD-10-CM

## 2015-09-10 DIAGNOSIS — Z5181 Encounter for therapeutic drug level monitoring: Secondary | ICD-10-CM

## 2015-09-10 LAB — POCT INR: INR: 3.2

## 2015-10-11 ENCOUNTER — Ambulatory Visit (INDEPENDENT_AMBULATORY_CARE_PROVIDER_SITE_OTHER): Payer: Medicare Other | Admitting: *Deleted

## 2015-10-11 DIAGNOSIS — I4891 Unspecified atrial fibrillation: Secondary | ICD-10-CM

## 2015-10-11 DIAGNOSIS — Z5181 Encounter for therapeutic drug level monitoring: Secondary | ICD-10-CM | POA: Diagnosis not present

## 2015-10-11 LAB — POCT INR: INR: 3.2

## 2015-10-25 ENCOUNTER — Ambulatory Visit (INDEPENDENT_AMBULATORY_CARE_PROVIDER_SITE_OTHER): Payer: Medicare Other | Admitting: Pharmacist

## 2015-10-25 DIAGNOSIS — I4891 Unspecified atrial fibrillation: Secondary | ICD-10-CM | POA: Diagnosis not present

## 2015-10-25 DIAGNOSIS — Z5181 Encounter for therapeutic drug level monitoring: Secondary | ICD-10-CM | POA: Diagnosis not present

## 2015-10-25 LAB — POCT INR: INR: 2.3

## 2015-11-18 IMAGING — US US SOFT TISSUE HEAD/NECK
1 series · 14 of 25 positions shown · non-contrast
Comparison: 06/24/1958

CLINICAL DATA: Hypothyroidism

EXAM:
THYROID ULTRASOUND
TECHNIQUE: Ultrasound examination of the thyroid gland and adjacent soft
tissues was performed.

[Series 1: us soft tissue head/neck · 0.09mm/px · 14 of 61 slices shown]
[im 1/61]
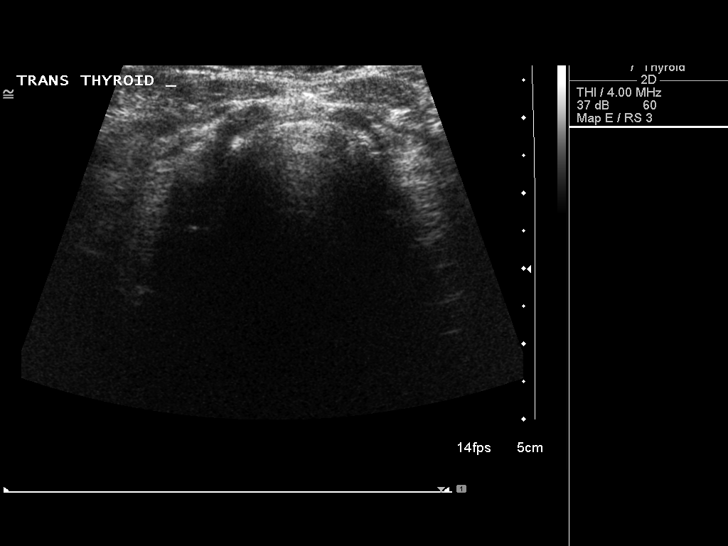
[im 6/61]
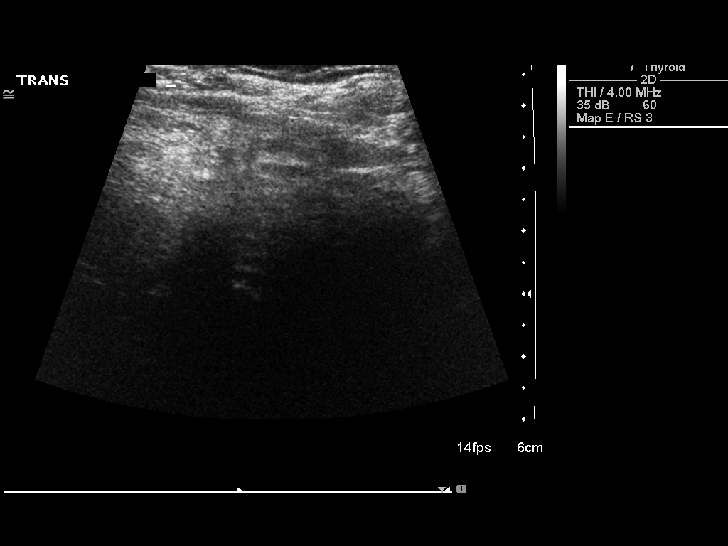
[im 11/61]
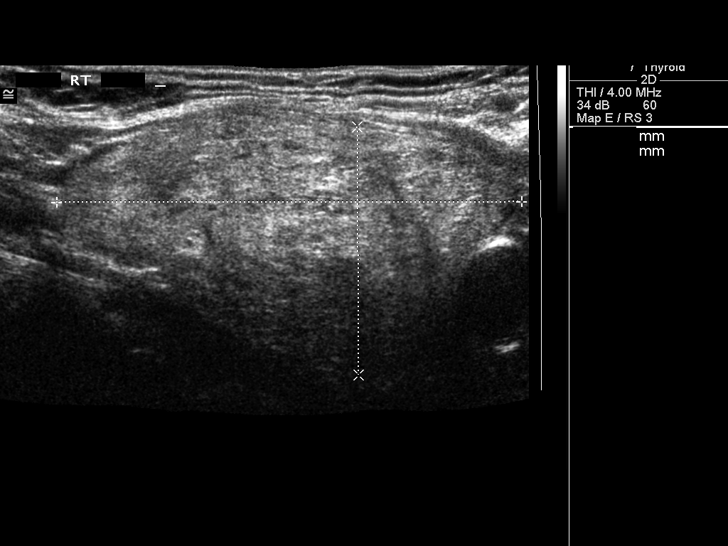
[im 16/61]
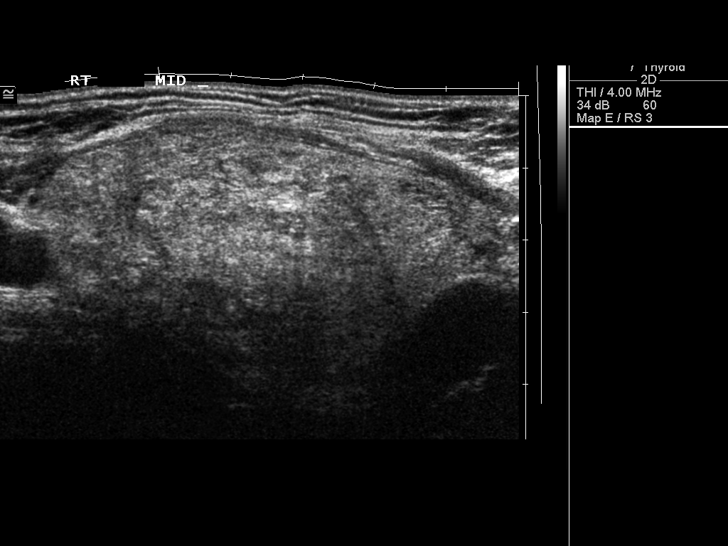
[im 21/61]
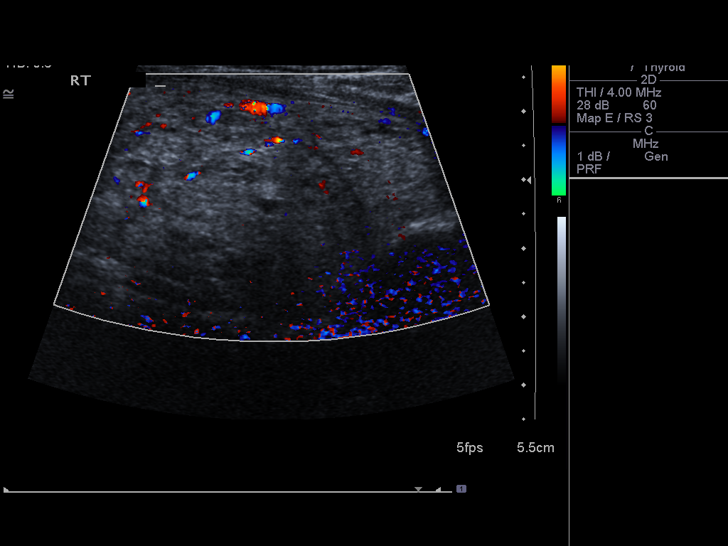
[im 23/61]
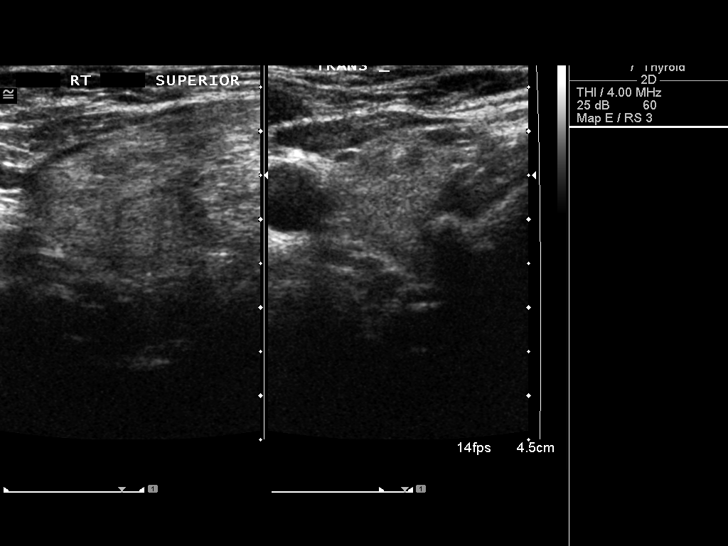
[im 28/61]
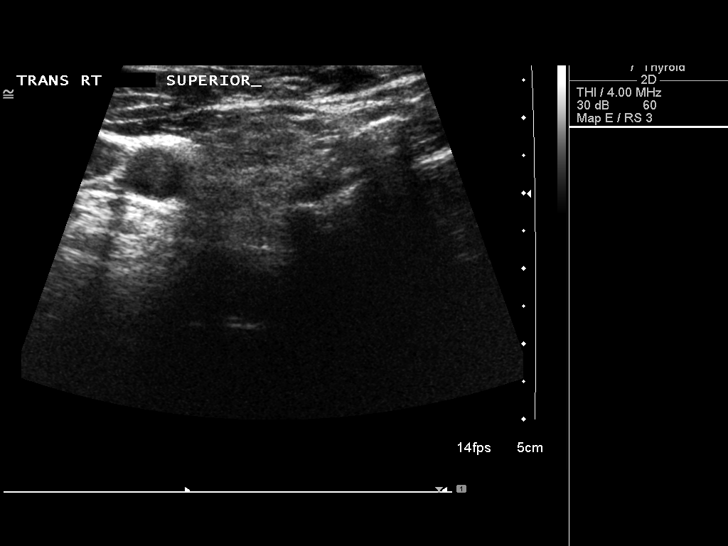
[im 33/61]
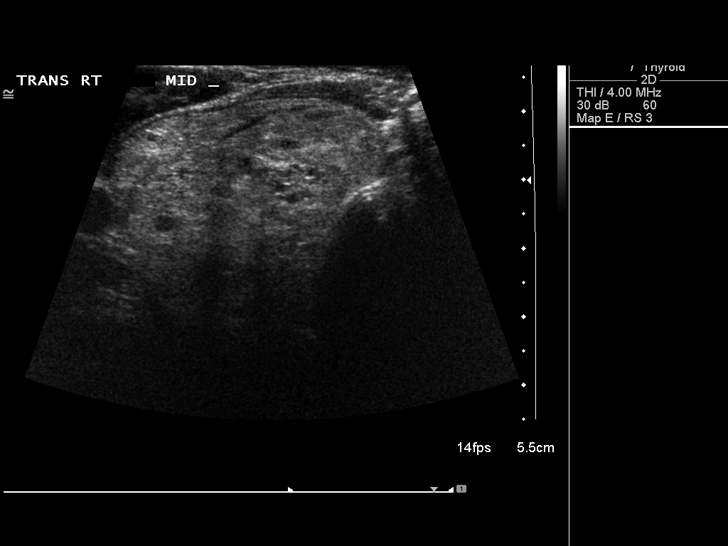
[im 38/61]
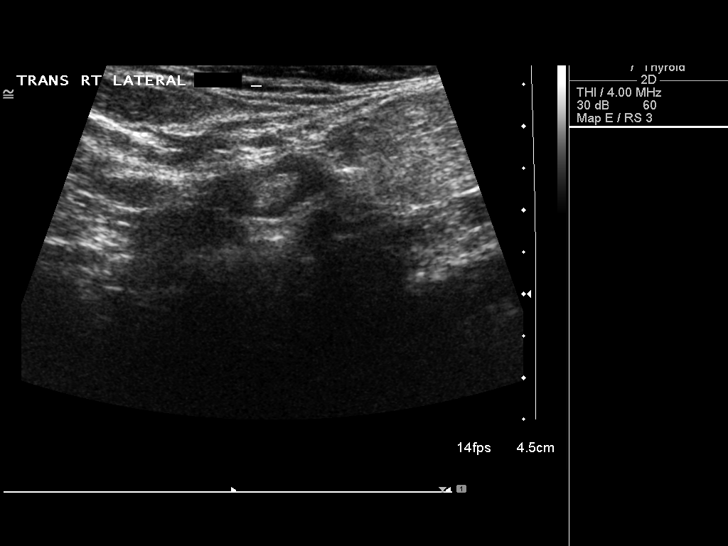
[im 41/61]
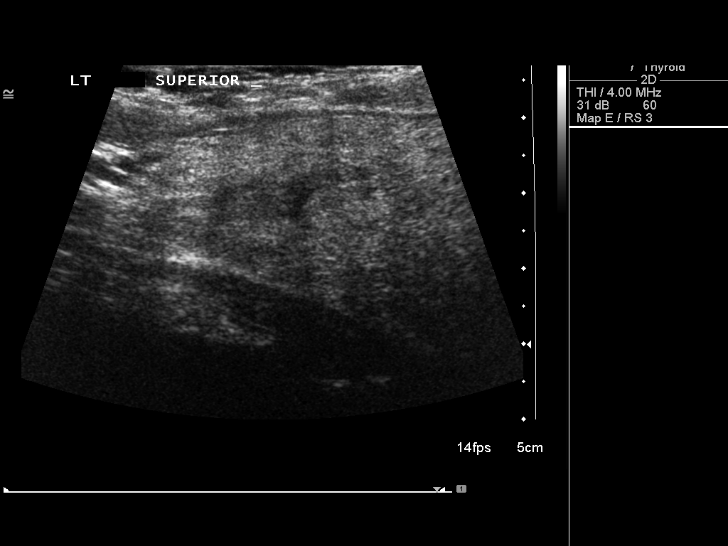
[im 46/61]
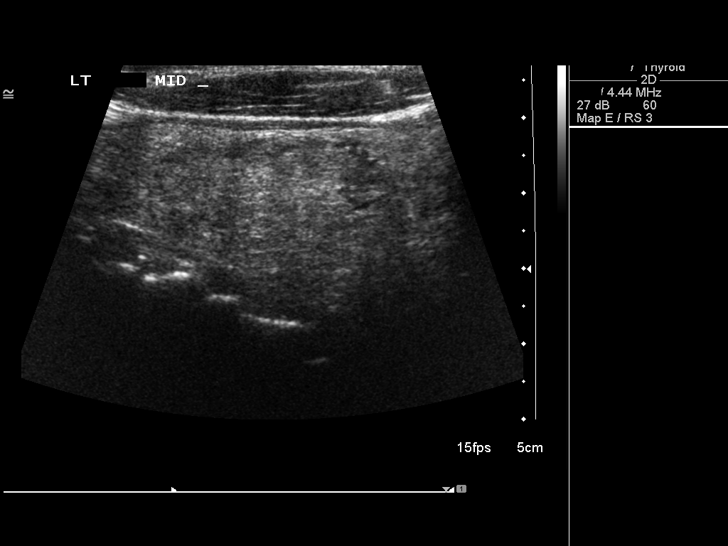
[im 51/61]
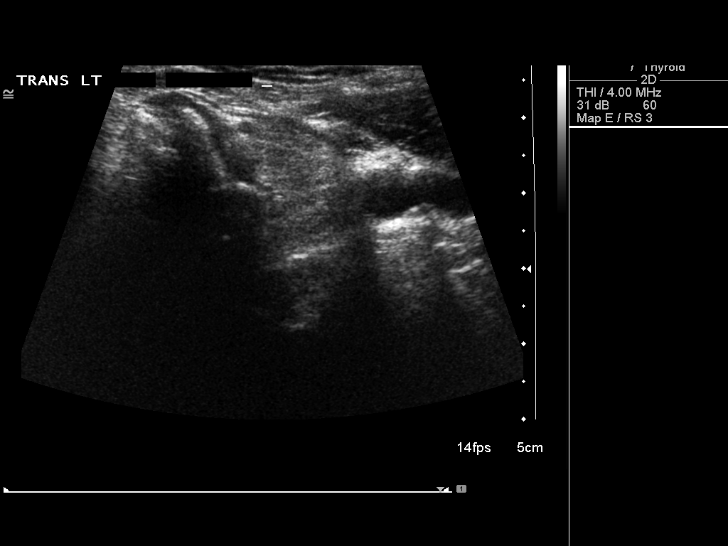
[im 56/61]
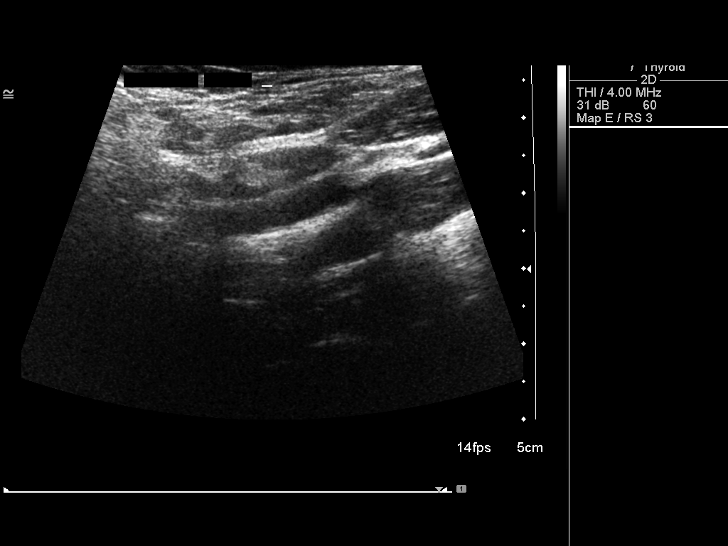
[im 61/61]
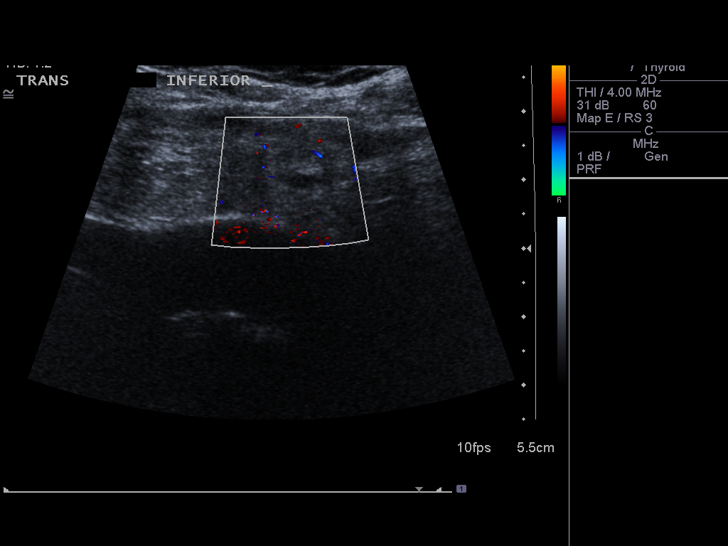

[14 of 25 positions shown; findings below may reference images not displayed]

FINDINGS: Right thyroid lobe

Measurements: 6.7 x 3.6 x 3.9 cm.. A dominant 5.1 cm heterogeneous
nodule is noted in the midportion of the right lobe of the thyroid.
This corresponds to the relative photopenic area in the right lobe
seen on prior nuclear exam from 5669. Multiple smaller less than 1
cm nodules are noted scattered throughout the right lobe of the
thyroid.

Left thyroid lobe

Measurements: 6.1 x 3.3 x 2.9 cm.. A 3.3 cm nodule is noted in the
midportion of the left lobe of the thyroid.

Isthmus

Thickness: 14 mm.  No nodules visualized.

Lymphadenopathy

None visualized.
IMPRESSION: Bilateral dominant thyroid nodules. Findings meet consensus criteria
for biopsy. Ultrasound-guided fine needle aspiration should be
considered, as per the consensus statement: Management of Thyroid
Nodules Detected at US: Society of Radiologists in Ultrasound

## 2015-11-22 ENCOUNTER — Ambulatory Visit (INDEPENDENT_AMBULATORY_CARE_PROVIDER_SITE_OTHER): Payer: Medicare Other | Admitting: *Deleted

## 2015-11-22 DIAGNOSIS — Z5181 Encounter for therapeutic drug level monitoring: Secondary | ICD-10-CM

## 2015-11-22 DIAGNOSIS — I4891 Unspecified atrial fibrillation: Secondary | ICD-10-CM | POA: Diagnosis not present

## 2015-11-22 LAB — POCT INR: INR: 2.1

## 2015-11-29 ENCOUNTER — Other Ambulatory Visit (INDEPENDENT_AMBULATORY_CARE_PROVIDER_SITE_OTHER): Payer: Medicare Other

## 2015-11-29 DIAGNOSIS — E1165 Type 2 diabetes mellitus with hyperglycemia: Secondary | ICD-10-CM

## 2015-11-29 LAB — HEMOGLOBIN A1C: HEMOGLOBIN A1C: 6.9 % — AB (ref 4.6–6.5)

## 2015-11-29 LAB — BASIC METABOLIC PANEL
BUN: 19 mg/dL (ref 6–23)
CALCIUM: 9.6 mg/dL (ref 8.4–10.5)
CO2: 29 meq/L (ref 19–32)
CREATININE: 1.03 mg/dL (ref 0.40–1.20)
Chloride: 104 mEq/L (ref 96–112)
GFR: 69.22 mL/min (ref >60.00)
Glucose, Bld: 141 mg/dL — ABNORMAL HIGH (ref 70–99)
Potassium: 3.7 mEq/L (ref 3.5–5.1)
SODIUM: 141 meq/L (ref 135–145)

## 2015-12-03 ENCOUNTER — Ambulatory Visit (INDEPENDENT_AMBULATORY_CARE_PROVIDER_SITE_OTHER): Payer: Medicare Other | Admitting: Endocrinology

## 2015-12-03 ENCOUNTER — Encounter: Payer: Self-pay | Admitting: Endocrinology

## 2015-12-03 VITALS — BP 138/84 | HR 94 | Wt 253.0 lb

## 2015-12-03 DIAGNOSIS — E042 Nontoxic multinodular goiter: Secondary | ICD-10-CM

## 2015-12-03 DIAGNOSIS — E1165 Type 2 diabetes mellitus with hyperglycemia: Secondary | ICD-10-CM

## 2015-12-03 DIAGNOSIS — I10 Essential (primary) hypertension: Secondary | ICD-10-CM

## 2015-12-03 MED ORDER — METFORMIN HCL ER 500 MG PO TB24: 2000.0000 mg | ORAL_TABLET | Freq: Every day | ORAL | 1 refills | Status: DC

## 2015-12-03 NOTE — Progress Notes (Signed)
Patient ID: Megan Fischer, female   DOB: 1951/03/30, 65 y.o.   MRN: [id number]   Reason for Appointment: Diabetes follow-up   History of Present Illness   Diagnosis: Type 2 DIABETES MELITUS, date of diagnosis:  2000     Previous history: She has been on various regimens of treatment in the past including Byetta and generally has done better with GLP-1 drugs She continues to take low-dose glimepiride which helps with overall control. Usually blood sugars are not variable Because of occasional hypoglycemia her Amaryl was stopped in 03/2014  Recent history: Oral hypoglycemic drugs: Metformin ER 500 mg, 3 tablets at dinner       Side effects from medications: None  She has been on Victoza since at least 2013 with good control,  taking 1.8 mg in the mornings daily  Current blood sugar patterns and problems identified:  She has an improved A1c of 6.9, not clear why it was 7.5 on her last visit  Has probably benefited from increasing Victoza to 1.8 mg  However did not bring her monitor for download and not clear what her patterns are  She reports fairly good readings except for sporadic high readings  Morning sugars tend to be relatively high  Although her weight is not down recently she is trying to be more active with going to the [facility] more regularly Lab glucose was 141  Monitors blood glucose: once or twice a day.    Glucometer:  One Touch  Blood sugars by recall: Overall range 89-225   Am usually 130-140    Meals: 2-3 meals per day.  Does not always eat breakfast Dinner at 5 pm           Physical activity: exercise: Wal[place] 3 [place] a week now Dietician visit: Most recent: 2005   Wt Readings from Last 3 Encounters:  12/03/15 253 lb (114.8 kg)  09/02/15 253 lb 12.8 oz (115.1 kg)  06/04/15 256 lb 3.7 oz (116.2 kg)   Lab Results  Component Value Date   HGBA1C 6.9 (H) 11/29/2015   HGBA1C 7.5 (H) 08/30/2015   HGBA1C 6.7 06/04/2015   Lab Results    Component Value Date   MICROALBUR 0.9 08/30/2015   LDLCALC 77 08/30/2015   CREATININE 1.03 11/29/2015    Lab on 11/29/2015  Component Date Value Ref Range Status  . Hgb A1c MFr Bld 11/29/2015 6.9* 4.6 - 6.5 % Final  . Sodium 11/29/2015 141  135 - 145 mEq/L Final  . Potassium 11/29/2015 3.7  3.5 - 5.1 mEq/L Final  . Chloride 11/29/2015 104  96 - 112 mEq/L Final  . CO2 11/29/2015 29  19 - 32 mEq/L Final  . Glucose, Bld 11/29/2015 141* 70 - 99 mg/dL Final  . BUN 11/29/2015 19  6 - 23 mg/dL Final  . Creatinine, Ser 11/29/2015 1.03  0.40 - 1.20 mg/dL Final  . Calcium 11/29/2015 9.6  8.4 - 10.5 mg/dL Final  . GFR 11/29/2015 69.22  >60.00 mL/min Final      Medication List       Accurate as of 12/03/15  8:40 AM. Always use your most recent med list.          benazepril-hydrochlorthiazide 20-12.5 MG tablet Commonly known as:  LOTENSIN HCT Take 1 tablet by mouth  daily   CALCIUM PO Take by mouth 2 (two) times daily.   cloNIDine 0.2 mg/24hr patch Commonly known as:  CATAPRES-TTS-2 Place 1 patch (0.2 mg total)  onto the skin once a week.   diltiazem 240 MG 24 hr capsule Commonly known as:  CARDIZEM CD Take 1 capsule by mouth  daily   ergocalciferol 50000 units capsule Commonly known as:  VITAMIN D2 Take 50,000 Units by mouth once a week.   ferrous sulfate 325 (65 FE) MG EC tablet Take 1 tablet (325 mg total) by mouth 2 (two) times daily.   Insulin Pen Needle 32G X 4 MM Misc Commonly known as:  BD PEN NEEDLE NANO U/F Use one per day to inject Victoza   BD PEN NEEDLE NANO U/F 32G X 4 MM Misc Generic drug:  Insulin Pen Needle Use one per day to inject  Victoza   Liraglutide 18 MG/3ML Sopn Commonly known as:  VICTOZA Inject 0.3 mLs (1.8 mg total) into the skin daily.   metFORMIN 500 MG 24 hr tablet Commonly known as:  GLUCOPHAGE-XR Take 1 tablet by mouth 3  times daily   metoprolol succinate 100 MG 24 hr tablet Commonly known as:  TOPROL-XL TAKE ONE TABLET BY  MOUTH  ONCE DAILY. TAKE WITH OR  IMMEDIATELY FOLLOWING A  MEAL.   ONETOUCH DELICA LANCETS FINE Misc Use to obtain a blood specimen 2 times per day dx code 250.02   ONETOUCH VERIO IQ SYSTEM w/Device Kit Use to check blood sugar 2 times per day dx code 250.02   ONETOUCH VERIO test strip Generic drug:  glucose blood Use as directed check blood sugar two times daily   rosuvastatin 10 MG tablet Commonly known as:  CRESTOR Take 1 tablet by mouth  daily   warfarin 5 MG tablet Commonly known as:  COUMADIN Take as directed by  Coumadin Clinic       Allergies:  Allergies  Allergen Reactions  . Codeine   . Oxycodone Hcl   . Penicillins   . Chlorhexidine Gluconate Rash    Past Medical History:  Diagnosis Date  . Atrial fibrillation ([place])   . Chronic disease anemia 03/29/2011  . Diabetes mellitus   . Dyslipidemia   . Hypertension   . Obesities, morbid ([place])   . Osteoarthritis     Past Surgical History:  Procedure Laterality Date  . CHOLECYSTECTOMY    . REPLACEMENT TOTAL KNEE BILATERAL    . SHOULDER SURGERY     RIGHT SHOULDER  . TUBAL LIGATION      Family History  Problem Relation Age of Onset  . Heart attack Mother   . Stroke Mother   . Diabetes Mother   . Hypertension Brother     Social History:  reports that she has never smoked. She does not have any smokeless tobacco history on file. She reports that she does not drink alcohol or use drugs.  Review of Systems:  History of atrial fibrillation: She is on Coumadin and followed by cardiologist  Hypertension:  she has required multiple medications and blood pressure is Fairly well controlled Tolerating clonidine topically She is also taking diltiazem, metoprolol and benazepril HCT Blood pressure was relatively higher today.  She says that she takes her medications at 10 AM instead of earlier in the morning when she gets up  BP Readings from Last 3 Encounters:  12/03/15 (!) 140/98  09/02/15 136/82  06/04/15  122/78     Lipids:  well controlled with Crestor and she is Getting good results  Lab Results  Component Value Date   CHOL 149 08/30/2015   HDL 62.40 08/30/2015   LDLCALC 77 08/30/2015   TRIG  51.0 08/30/2015   CHOLHDL 2 08/30/2015    History of goiter, long-standing which is euthyroid, tends to have relatively low TSH without history of hyperthyroidism, This is now normal   Lab Results  Component Value Date   FREET4 1.10 08/30/2015   FREET4 1.13 06/25/2014   FREET4 0.97 09/16/2013   TSH 0.35 08/30/2015   TSH 0.41 06/25/2014   TSH 0.30 (L) 09/16/2013   History of anemia followed by hematologist    Examination:   BP (!) 140/98 (BP Location: Left Arm, Patient Position: Sitting)   Pulse 94   Wt 253 lb (114.8 kg)   SpO2 96%   BMI 44.82 kg/m   Body mass index is 44.82 kg/m.    Repeat blood pressure 138/84 1+ left lower leg edema  ASSESSMENT/ PLAN:   Diabetes type 2 With obesity and BMI 45  Blood glucose control is fairly good with A1c below 7 again She may have benefited from increasing Victoza She is more motivated to exercise She will need to bring her monitor for download on the next visit, not checked today   HYPERTENSION: Controlled. However since blood pressure is higher today we will have her try to take her blood pressure medications at least her Lotensin HCT when she wakes up instead of 10 AM       Risa Auman 12/03/2015, 8:40 AM   Note: This office note was prepared with Estate agent. Any transcriptional errors that result from this process are unintentional.

## 2015-12-07 ENCOUNTER — Other Ambulatory Visit: Payer: Self-pay | Admitting: Cardiovascular Disease

## 2015-12-27 ENCOUNTER — Ambulatory Visit (INDEPENDENT_AMBULATORY_CARE_PROVIDER_SITE_OTHER): Payer: Medicare Other | Admitting: *Deleted

## 2015-12-27 DIAGNOSIS — I4891 Unspecified atrial fibrillation: Secondary | ICD-10-CM | POA: Diagnosis not present

## 2015-12-27 DIAGNOSIS — Z5181 Encounter for therapeutic drug level monitoring: Secondary | ICD-10-CM

## 2015-12-27 LAB — POCT INR: INR: 3.8

## 2016-01-17 ENCOUNTER — Ambulatory Visit (INDEPENDENT_AMBULATORY_CARE_PROVIDER_SITE_OTHER): Payer: Medicare Other | Admitting: *Deleted

## 2016-01-17 DIAGNOSIS — Z5181 Encounter for therapeutic drug level monitoring: Secondary | ICD-10-CM | POA: Diagnosis not present

## 2016-01-17 DIAGNOSIS — I4891 Unspecified atrial fibrillation: Secondary | ICD-10-CM

## 2016-01-17 LAB — POCT INR: INR: 3.8

## 2016-01-31 ENCOUNTER — Ambulatory Visit (INDEPENDENT_AMBULATORY_CARE_PROVIDER_SITE_OTHER): Payer: Medicare Other | Admitting: *Deleted

## 2016-01-31 DIAGNOSIS — Z5181 Encounter for therapeutic drug level monitoring: Secondary | ICD-10-CM | POA: Diagnosis not present

## 2016-01-31 DIAGNOSIS — I4891 Unspecified atrial fibrillation: Secondary | ICD-10-CM

## 2016-01-31 LAB — POCT INR: INR: 2.6

## 2016-02-21 ENCOUNTER — Ambulatory Visit (INDEPENDENT_AMBULATORY_CARE_PROVIDER_SITE_OTHER): Payer: Medicare Other | Admitting: *Deleted

## 2016-02-21 DIAGNOSIS — I4891 Unspecified atrial fibrillation: Secondary | ICD-10-CM | POA: Diagnosis not present

## 2016-02-21 DIAGNOSIS — Z5181 Encounter for therapeutic drug level monitoring: Secondary | ICD-10-CM

## 2016-02-21 LAB — POCT INR: INR: 1.8

## 2016-02-28 ENCOUNTER — Other Ambulatory Visit (INDEPENDENT_AMBULATORY_CARE_PROVIDER_SITE_OTHER): Payer: Medicare Other

## 2016-02-28 DIAGNOSIS — E1165 Type 2 diabetes mellitus with hyperglycemia: Secondary | ICD-10-CM

## 2016-02-28 DIAGNOSIS — E042 Nontoxic multinodular goiter: Secondary | ICD-10-CM

## 2016-02-28 LAB — BASIC METABOLIC PANEL
BUN: 25 mg/dL — AB (ref 6–23)
CHLORIDE: 106 meq/L (ref 96–112)
CO2: 28 meq/L (ref 19–32)
Calcium: 9.4 mg/dL (ref 8.4–10.5)
Creatinine, Ser: 1.11 mg/dL (ref 0.40–1.20)
GFR: 63.45 mL/min (ref >60.00)
GLUCOSE: 118 mg/dL — AB (ref 70–99)
POTASSIUM: 3.6 meq/L (ref 3.5–5.1)
SODIUM: 141 meq/L (ref 135–145)

## 2016-02-28 LAB — HEMOGLOBIN A1C: Hgb A1c MFr Bld: 6.8 % — ABNORMAL HIGH (ref 4.6–6.5)

## 2016-02-28 LAB — TSH: TSH: 0.27 u[IU]/mL — AB (ref 0.35–4.50)

## 2016-02-28 LAB — T4, FREE: FREE T4: 1.15 ng/dL (ref 0.60–1.60)

## 2016-03-03 ENCOUNTER — Encounter: Payer: Self-pay | Admitting: Endocrinology

## 2016-03-03 ENCOUNTER — Ambulatory Visit (INDEPENDENT_AMBULATORY_CARE_PROVIDER_SITE_OTHER): Payer: Medicare Other | Admitting: Endocrinology

## 2016-03-03 VITALS — BP 128/84 | HR 86 | Ht 63.0 in | Wt 247.0 lb

## 2016-03-03 DIAGNOSIS — E785 Hyperlipidemia, unspecified: Secondary | ICD-10-CM

## 2016-03-03 DIAGNOSIS — I10 Essential (primary) hypertension: Secondary | ICD-10-CM | POA: Diagnosis not present

## 2016-03-03 DIAGNOSIS — D508 Other iron deficiency anemias: Secondary | ICD-10-CM

## 2016-03-03 DIAGNOSIS — R946 Abnormal results of thyroid function studies: Secondary | ICD-10-CM

## 2016-03-03 DIAGNOSIS — R7989 Other specified abnormal findings of blood chemistry: Secondary | ICD-10-CM

## 2016-03-03 DIAGNOSIS — E1122 Type 2 diabetes mellitus with diabetic chronic kidney disease: Secondary | ICD-10-CM | POA: Diagnosis not present

## 2016-03-03 DIAGNOSIS — N181 Chronic kidney disease, stage 1: Secondary | ICD-10-CM

## 2016-03-03 NOTE — Progress Notes (Signed)
Patient ID: Megan Fischer, female   DOB: 1950-09-12, 65 y.o.   MRN: [id number]   Reason for Appointment: Diabetes follow-up   History of Present Illness   Diagnosis: Type 2 DIABETES MELITUS, date of diagnosis:  2000     Previous history: She has been on various regimens of treatment in the past including Byetta and generally has done better with GLP-1 drugs She continues to take low-dose glimepiride which helps with overall control. Usually blood sugars are not variable Because of occasional hypoglycemia her Amaryl was stopped in 03/2014  Recent history: Oral hypoglycemic drugs: Metformin ER 500 mg, 4 tablets at dinner       Side effects from medications: None  She has been on Victoza since at least 2013 with good control,  taking 1.8 mg in the mornings daily  Current blood sugar patterns and problems identified:  She has an improved A1c of 6.8, previously 6.9  She continues to lose some weight and likely benefiting from of 1.8 dose Victoza  Continues to be tolerating metformin well at 2000 mg  Blood sugars are fairly consistent throughout the day with minimal increase after meals although not checking as many readings after supper  She thinks fruits like grapes makes her sugar go up  She reports fairly good readings except for sporadic high readings  Morning sugars are just above normal  Again she is trying to be more active with going to the [facility] regularly and taking care of grandchildren Lab glucose was 128  Monitors blood glucose: once or twice a day.    Glucometer:  One Touch   Mean values apply above for all meters except median for One Touch  PRE-MEAL Fasting Lunch Dinner Bedtime Overall  Glucose range:    109-192  93-192   Mean/median: 111   110 122 122  114    POST-MEAL PC Breakfast PC Lunch PC Dinner  Glucose range:     Mean/median:  117      Wt Readings from Last 3 Encounters:  03/03/16 247 lb (112 kg)  12/03/15 253 lb (114.8 kg)  09/02/15  253 lb 12.8 oz (115.1 kg)   Lab Results  Component Value Date   HGBA1C 6.8 (H) 02/28/2016   HGBA1C 6.9 (H) 11/29/2015   HGBA1C 7.5 (H) 08/30/2015   Lab Results  Component Value Date   MICROALBUR 0.9 08/30/2015   LDLCALC 77 08/30/2015   CREATININE 1.11 02/28/2016    Lab on 02/28/2016  Component Date Value Ref Range Status  . Hgb A1c MFr Bld 02/28/2016 6.8* 4.6 - 6.5 % Final  . Sodium 02/28/2016 141  135 - 145 mEq/L Final  . Potassium 02/28/2016 3.6  3.5 - 5.1 mEq/L Final  . Chloride 02/28/2016 106  96 - 112 mEq/L Final  . CO2 02/28/2016 28  19 - 32 mEq/L Final  . Glucose, Bld 02/28/2016 118* 70 - 99 mg/dL Final  . BUN 02/28/2016 25* 6 - 23 mg/dL Final  . Creatinine, Ser 02/28/2016 1.11  0.40 - 1.20 mg/dL Final  . Calcium 02/28/2016 9.4  8.4 - 10.5 mg/dL Final  . GFR 02/28/2016 63.45  >60.00 mL/min Final  . TSH 02/28/2016 0.27* 0.35 - 4.50 uIU/mL Final  . Free T4 02/28/2016 1.15  0.60 - 1.60 ng/dL Final   Comment: Specimens from patients who are undergoing biotin therapy and /or ingesting biotin supplements may contain high levels of biotin.  The higher biotin concentration in these specimens interferes with this Free T4 assay.  Specimens that contain high levels  of biotin may cause false high results for this Free T4 assay.  Please interpret results in light of the total clinical presentation of the patient.        Medication List       Accurate as of 03/03/16  8:19 AM. Always use your most recent med list.          benazepril-hydrochlorthiazide 20-12.5 MG tablet Commonly known as:  LOTENSIN HCT Take 1 tablet by mouth  daily   CALCIUM PO Take by mouth 2 (two) times daily.   cloNIDine 0.2 mg/24hr patch Commonly known as:  CATAPRES-TTS-2 Place 1 patch (0.2 mg total) onto the skin once a week.   diltiazem 240 MG 24 hr capsule Commonly known as:  CARDIZEM CD Take 1 capsule by mouth  daily   ergocalciferol 50000 units capsule Commonly known as:  VITAMIN D2 Take  50,000 Units by mouth once a week.   ferrous sulfate 325 (65 FE) MG EC tablet Take 1 tablet (325 mg total) by mouth 2 (two) times daily.   Insulin Pen Needle 32G X 4 MM Misc Commonly known as:  BD PEN NEEDLE NANO U/F Use one per day to inject Victoza   BD PEN NEEDLE NANO U/F 32G X 4 MM Misc Generic drug:  Insulin Pen Needle Use one per day to inject  Victoza   liraglutide 18 MG/3ML Sopn Commonly known as:  VICTOZA Inject 0.3 mLs (1.8 mg total) into the skin daily.   metFORMIN 500 MG 24 hr tablet Commonly known as:  GLUCOPHAGE-XR Take 4 tablets (2,000 mg total) by mouth daily with supper.   metoprolol succinate 100 MG 24 hr tablet Commonly known as:  TOPROL-XL TAKE ONE TABLET BY MOUTH  ONCE DAILY. TAKE WITH OR  IMMEDIATELY FOLLOWING A  MEAL.   ONETOUCH DELICA LANCETS FINE Misc Use to obtain a blood specimen 2 times per day dx code 250.02   ONETOUCH VERIO IQ SYSTEM w/Device Kit Use to check blood sugar 2 times per day dx code 250.02   ONETOUCH VERIO test strip Generic drug:  glucose blood Use as directed check blood sugar two times daily   rosuvastatin 10 MG tablet Commonly known as:  CRESTOR Take 1 tablet by mouth  daily   warfarin 5 MG tablet Commonly known as:  COUMADIN Take as directed by  Coumadin Clinic       Allergies:  Allergies  Allergen Reactions  . Codeine   . Oxycodone Hcl   . Penicillins   . Chlorhexidine Gluconate Rash    Past Medical History:  Diagnosis Date  . Atrial fibrillation ([place])   . Chronic disease anemia 03/29/2011  . Diabetes mellitus   . Dyslipidemia   . Hypertension   . Obesities, morbid ([place])   . Osteoarthritis     Past Surgical History:  Procedure Laterality Date  . CHOLECYSTECTOMY    . REPLACEMENT TOTAL KNEE BILATERAL    . SHOULDER SURGERY     RIGHT SHOULDER  . TUBAL LIGATION      Family History  Problem Relation Age of Onset  . Heart attack Mother   . Stroke Mother   . Diabetes Mother   . Hypertension Brother      Social History:  reports that she has never smoked. She does not have any smokeless tobacco history on file. She reports that she does not drink alcohol or use drugs.  Review of Systems:  History of atrial fibrillation: She is on  Coumadin and followed by cardiologist  Hypertension:  she has required multiple medications and blood pressure is usually well controlled She is currently taking diltiazem, metoprolol, Catapres patch and benazepril HCT She has been asked to take her medications early morning and she has usually done this except today  BP Readings from Last 3 Encounters:  03/03/16 128/84  12/03/15 138/84  09/02/15 136/82     Lipids:  well controlled with Crestor and LDL is excellent   Lab Results  Component Value Date   CHOL 149 08/30/2015   HDL 62.40 08/30/2015   LDLCALC 77 08/30/2015   TRIG 51.0 08/30/2015   CHOLHDL 2 08/30/2015    History of goiter, long-standing which is euthyroid, tends to have relatively low TSH without Evidence of hyperthyroidism, has no difficulty swallowing   Lab Results  Component Value Date   FREET4 1.15 02/28/2016   FREET4 1.10 08/30/2015   FREET4 1.13 06/25/2014   TSH 0.27 (L) 02/28/2016   TSH 0.35 08/30/2015   TSH 0.41 06/25/2014   History of anemia followed by hematologist   Lab Results  Component Value Date   WBC 6.5 09/24/2014   HGB 11.1 (L) 09/24/2014   HCT 34.3 (L) 09/24/2014   MCV 88.9 09/24/2014   PLT 259 09/24/2014      Examination:   BP 128/84   Pulse 86   Ht 5' 3"  (1.6 m)   Wt 247 lb (112 kg)   SpO2 96%   BMI 43.75 kg/m   Body mass index is 43.75 kg/m.    Thyroid is enlarged about 3 times normal on the right, better front on swallowing.  Left side is about twice normal and also firm and lobulated Pulse slightly irregular Biceps reflexes normal No tremor 1+ left lower leg edema  ASSESSMENT/ PLAN:   Diabetes type 2 With obesity and BMI 45  Blood glucose control is fairly good with A1c below  7 again She is on maximum doses of metformin and Victoza and tolerating these well She is able to exercise more regularly and has lost weight down She was tried to be consistent on diet also, avoid excessive fruits She can cut back on glucose monitoring especially fasting readings  HYPERTENSION: Controlled. Will continue same regimen  GOITER: This is stable on exam and she has again mild autonomy with slightly low TSH and no hyperthyroidism Tends to have a mildly fast heart rate because of her atrial fibrillation    ANEMIA: She reportedly had a physical with her PCP in summer and not clear what her hemoglobin was.  Will check this every 6 months  Recommended influenza vaccine: She refuses this today  Kenzee Bassin 03/03/2016, 8:19 AM   Note: This office note was prepared with Dragon voice recognition system technology. Any transcriptional errors that result from this process are unintentional.

## 2016-03-05 ENCOUNTER — Other Ambulatory Visit: Payer: Self-pay | Admitting: Endocrinology

## 2016-03-06 ENCOUNTER — Other Ambulatory Visit: Payer: Self-pay

## 2016-03-07 ENCOUNTER — Other Ambulatory Visit: Payer: Self-pay

## 2016-03-07 MED ORDER — WARFARIN SODIUM 5 MG PO TABS: ORAL_TABLET | ORAL | 1 refills | Status: DC

## 2016-03-10 ENCOUNTER — Ambulatory Visit (INDEPENDENT_AMBULATORY_CARE_PROVIDER_SITE_OTHER): Payer: Medicare Other | Admitting: *Deleted

## 2016-03-10 DIAGNOSIS — Z5181 Encounter for therapeutic drug level monitoring: Secondary | ICD-10-CM

## 2016-03-10 DIAGNOSIS — I4891 Unspecified atrial fibrillation: Secondary | ICD-10-CM | POA: Diagnosis not present

## 2016-03-10 LAB — POCT INR: INR: 1.8

## 2016-04-03 ENCOUNTER — Ambulatory Visit (INDEPENDENT_AMBULATORY_CARE_PROVIDER_SITE_OTHER): Payer: Medicare Other | Admitting: *Deleted

## 2016-04-03 DIAGNOSIS — I4891 Unspecified atrial fibrillation: Secondary | ICD-10-CM

## 2016-04-03 DIAGNOSIS — Z5181 Encounter for therapeutic drug level monitoring: Secondary | ICD-10-CM

## 2016-04-03 LAB — POCT INR: INR: 4.2

## 2016-04-17 ENCOUNTER — Ambulatory Visit (INDEPENDENT_AMBULATORY_CARE_PROVIDER_SITE_OTHER): Payer: Medicare Other | Admitting: *Deleted

## 2016-04-17 DIAGNOSIS — I4891 Unspecified atrial fibrillation: Secondary | ICD-10-CM | POA: Diagnosis not present

## 2016-04-17 DIAGNOSIS — Z5181 Encounter for therapeutic drug level monitoring: Secondary | ICD-10-CM | POA: Diagnosis not present

## 2016-04-17 LAB — POCT INR: INR: 2.3

## 2016-05-12 ENCOUNTER — Ambulatory Visit (INDEPENDENT_AMBULATORY_CARE_PROVIDER_SITE_OTHER): Payer: Medicare Other

## 2016-05-12 DIAGNOSIS — I4891 Unspecified atrial fibrillation: Secondary | ICD-10-CM | POA: Diagnosis not present

## 2016-05-12 DIAGNOSIS — Z5181 Encounter for therapeutic drug level monitoring: Secondary | ICD-10-CM

## 2016-05-12 LAB — POCT INR: INR: 2.5

## 2016-06-12 ENCOUNTER — Ambulatory Visit (INDEPENDENT_AMBULATORY_CARE_PROVIDER_SITE_OTHER): Payer: Medicare Other

## 2016-06-12 ENCOUNTER — Encounter (INDEPENDENT_AMBULATORY_CARE_PROVIDER_SITE_OTHER): Payer: Self-pay

## 2016-06-12 DIAGNOSIS — Z5181 Encounter for therapeutic drug level monitoring: Secondary | ICD-10-CM | POA: Diagnosis not present

## 2016-06-12 DIAGNOSIS — I4891 Unspecified atrial fibrillation: Secondary | ICD-10-CM | POA: Diagnosis not present

## 2016-06-12 LAB — POCT INR: INR: 4

## 2016-06-27 ENCOUNTER — Ambulatory Visit (INDEPENDENT_AMBULATORY_CARE_PROVIDER_SITE_OTHER): Payer: Medicare Other | Admitting: *Deleted

## 2016-06-27 DIAGNOSIS — Z5181 Encounter for therapeutic drug level monitoring: Secondary | ICD-10-CM | POA: Diagnosis not present

## 2016-06-27 DIAGNOSIS — I4891 Unspecified atrial fibrillation: Secondary | ICD-10-CM

## 2016-06-27 LAB — POCT INR: INR: 2.2

## 2016-06-29 ENCOUNTER — Other Ambulatory Visit (INDEPENDENT_AMBULATORY_CARE_PROVIDER_SITE_OTHER): Payer: Medicare Other

## 2016-06-29 DIAGNOSIS — R946 Abnormal results of thyroid function studies: Secondary | ICD-10-CM | POA: Diagnosis not present

## 2016-06-29 DIAGNOSIS — E1122 Type 2 diabetes mellitus with diabetic chronic kidney disease: Secondary | ICD-10-CM | POA: Diagnosis not present

## 2016-06-29 DIAGNOSIS — R7989 Other specified abnormal findings of blood chemistry: Secondary | ICD-10-CM

## 2016-06-29 DIAGNOSIS — E785 Hyperlipidemia, unspecified: Secondary | ICD-10-CM | POA: Diagnosis not present

## 2016-06-29 DIAGNOSIS — N181 Chronic kidney disease, stage 1: Secondary | ICD-10-CM

## 2016-06-29 LAB — URINALYSIS, ROUTINE W REFLEX MICROSCOPIC
Hgb urine dipstick: NEGATIVE
Nitrite: NEGATIVE
SPECIFIC GRAVITY, URINE: 1.02 (ref 1.000–1.030)
URINE GLUCOSE: NEGATIVE
Urobilinogen, UA: 0.2 (ref 0.0–1.0)
pH: 6 (ref 5.0–8.0)

## 2016-06-29 LAB — CBC
HCT: 27.3 % — ABNORMAL LOW (ref 36.0–46.0)
HEMOGLOBIN: 9 g/dL — AB (ref 12.0–15.0)
MCHC: 33 g/dL (ref 30.0–36.0)
MCV: 84.2 fl (ref 78.0–100.0)
PLATELETS: 334 10*3/uL (ref 150.0–400.0)
RBC: 3.24 Mil/uL — AB (ref 3.87–5.11)
RDW: 17 % — ABNORMAL HIGH (ref 11.5–15.5)
WBC: 5.7 10*3/uL (ref 4.0–10.5)

## 2016-06-29 LAB — COMPREHENSIVE METABOLIC PANEL
ALT: 13 U/L (ref 0–35)
AST: 18 U/L (ref 0–37)
Albumin: 3.8 g/dL (ref 3.5–5.2)
Alkaline Phosphatase: 61 U/L (ref 39–117)
BILIRUBIN TOTAL: 0.4 mg/dL (ref 0.2–1.2)
BUN: 22 mg/dL (ref 6–23)
CO2: 28 meq/L (ref 19–32)
CREATININE: 1.03 mg/dL (ref 0.40–1.20)
Calcium: 9.4 mg/dL (ref 8.4–10.5)
Chloride: 107 mEq/L (ref 96–112)
GFR: 69.09 mL/min (ref >60.00)
Glucose, Bld: 97 mg/dL (ref 70–99)
Potassium: 3.9 mEq/L (ref 3.5–5.1)
SODIUM: 143 meq/L (ref 135–145)
Total Protein: 7 g/dL (ref 6.0–8.3)

## 2016-06-29 LAB — HEMOGLOBIN A1C: Hgb A1c MFr Bld: 6.6 % — ABNORMAL HIGH (ref 4.6–6.5)

## 2016-06-29 LAB — MICROALBUMIN / CREATININE URINE RATIO
CREATININE, U: 237.5 mg/dL
Microalb Creat Ratio: 4.4 mg/g (ref 0.0–30.0)
Microalb, Ur: 10.5 mg/dL — ABNORMAL HIGH (ref 0.0–1.9)

## 2016-06-29 LAB — TSH: TSH: 0.17 u[IU]/mL — AB (ref 0.35–4.50)

## 2016-06-29 LAB — LIPID PANEL
CHOL/HDL RATIO: 2
Cholesterol: 140 mg/dL (ref 0–200)
HDL: 62.2 mg/dL (ref >39.00)
LDL CALC: 66 mg/dL (ref 0–99)
NONHDL: 77.74
Triglycerides: 57 mg/dL (ref 0.0–149.0)
VLDL: 11.4 mg/dL (ref 0.0–40.0)

## 2016-06-29 LAB — T4, FREE: FREE T4: 1.32 ng/dL (ref 0.60–1.60)

## 2016-07-03 ENCOUNTER — Ambulatory Visit (INDEPENDENT_AMBULATORY_CARE_PROVIDER_SITE_OTHER): Payer: Medicare Other | Admitting: Endocrinology

## 2016-07-03 ENCOUNTER — Encounter: Payer: Self-pay | Admitting: Endocrinology

## 2016-07-03 VITALS — BP 138/88 | HR 99 | Ht 63.0 in | Wt 235.0 lb

## 2016-07-03 DIAGNOSIS — R946 Abnormal results of thyroid function studies: Secondary | ICD-10-CM | POA: Diagnosis not present

## 2016-07-03 DIAGNOSIS — E1165 Type 2 diabetes mellitus with hyperglycemia: Secondary | ICD-10-CM

## 2016-07-03 DIAGNOSIS — D508 Other iron deficiency anemias: Secondary | ICD-10-CM | POA: Diagnosis not present

## 2016-07-03 DIAGNOSIS — R7989 Other specified abnormal findings of blood chemistry: Secondary | ICD-10-CM

## 2016-07-03 DIAGNOSIS — I10 Essential (primary) hypertension: Secondary | ICD-10-CM

## 2016-07-03 NOTE — Progress Notes (Signed)
Patient ID: Megan Fischer, female   DOB: 09/05/1950, 66 y.o.   MRN: [id number]   Reason for Appointment: Diabetes follow-up   History of Present Illness   Diagnosis: Type 2 DIABETES MELITUS, date of diagnosis:  2000     Previous history: She has been on various regimens of treatment in the past including Byetta and generally has done better with GLP-1 drugs She continues to take low-dose glimepiride which helps with overall control. Usually blood sugars are not variable Because of occasional hypoglycemia her Amaryl was stopped in 03/2014  Recent history: Oral hypoglycemic drugs: Metformin ER 500 mg, 4 tablets at dinner       Side effects from medications: None  She has been on Victoza since at least 2013 with good control,  taking 1.8 mg in the mornings daily  Her A1c is excellent at 6., previously 6.8  Current blood sugar patterns and problems identified:  She continues to lose some weight   Overall doing well with diet with cutting back on portions and eating more vegetables and gr  Continues to be tolerating metformin well at 2000 mg and Victoza 1.8 mg  Blood sugars are fairly consistent throughout the day with sporadic high readings in the mornings  She reports fairly good readings except for sporadic high readings  Morning sugars are occasionally higher but postprandial readings are not  Not doing any formal exercise at the [facility] but just being active at home  Monitors blood glucose: once or twice a day.    Glucometer:  One Touch   Mean values apply above for all meters except median for One Touch  PRE-MEAL Fasting Lunch Dinner Bedtime Overall  Glucose range: 86-162    95-124    Mean/median: 109  108  113  101  108      Wt Readings from Last 3 Encounters:  07/03/16 235 lb (106.6 kg)  03/03/16 247 lb (112 kg)  12/03/15 253 lb (114.8 kg)   Lab Results  Component Value Date   HGBA1C 6.6 (H) 06/29/2016   HGBA1C 6.8 (H) 02/28/2016   HGBA1C 6.9 (H)  11/29/2015   Lab Results  Component Value Date   MICROALBUR 10.5 (H) 06/29/2016   LDLCALC 66 06/29/2016   CREATININE 1.03 06/29/2016   Other problems discussed today: See review of systems    Lab on 06/29/2016  Component Date Value Ref Range Status  . Hgb A1c MFr Bld 06/29/2016 6.6* 4.6 - 6.5 % Final  . Sodium 06/29/2016 143  135 - 145 mEq/L Final  . Potassium 06/29/2016 3.9  3.5 - 5.1 mEq/L Final  . Chloride 06/29/2016 107  96 - 112 mEq/L Final  . CO2 06/29/2016 28  19 - 32 mEq/L Final  . Glucose, Bld 06/29/2016 97  70 - 99 mg/dL Final  . BUN 06/29/2016 22  6 - 23 mg/dL Final  . Creatinine, Ser 06/29/2016 1.03  0.40 - 1.20 mg/dL Final  . Total Bilirubin 06/29/2016 0.4  0.2 - 1.2 mg/dL Final  . Alkaline Phosphatase 06/29/2016 61  39 - 117 U/L Final  . AST 06/29/2016 18  0 - 37 U/L Final  . ALT 06/29/2016 13  0 - 35 U/L Final  . Total Protein 06/29/2016 7.0  6.0 - 8.3 g/dL Final  . Albumin 06/29/2016 3.8  3.5 - 5.2 g/dL Final  . Calcium 06/29/2016 9.4  8.4 - 10.5 mg/dL Final  . GFR 06/29/2016 69.09  >60.00 mL/min Final  . Microalb, Ur 06/29/2016 10.5*  0.0 - 1.9 mg/dL Final  . Creatinine,U 06/29/2016 237.5  mg/dL Final  . Microalb Creat Ratio 06/29/2016 4.4  0.0 - 30.0 mg/g Final  . Cholesterol 06/29/2016 140  0 - 200 mg/dL Final  . Triglycerides 06/29/2016 57.0  0.0 - 149.0 mg/dL Final  . HDL 06/29/2016 62.20  >39.00 mg/dL Final  . VLDL 06/29/2016 11.4  0.0 - 40.0 mg/dL Final  . LDL Cholesterol 06/29/2016 66  0 - 99 mg/dL Final  . Total CHOL/HDL Ratio 06/29/2016 2   Final  . NonHDL 06/29/2016 77.74   Final  . Color, Urine 06/29/2016 YELLOW  Yellow;Lt. Yellow Final  . APPearance 06/29/2016 Sl Cloudy* Clear Final  . Specific Gravity, Urine 06/29/2016 1.020  1.000 - 1.030 Final  . pH 06/29/2016 6.0  5.0 - 8.0 Final  . Total Protein, Urine 06/29/2016 TRACE* Negative Final  . Urine Glucose 06/29/2016 NEGATIVE  Negative Final  . Ketones, ur 06/29/2016 TRACE* Negative Final  .  Bilirubin Urine 06/29/2016 SMALL* Negative Final  . Hgb urine dipstick 06/29/2016 NEGATIVE  Negative Final  . Urobilinogen, UA 06/29/2016 0.2  0.0 - 1.0 Final  . Leukocytes, UA 06/29/2016 TRACE* Negative Final  . Nitrite 06/29/2016 NEGATIVE  Negative Final  . WBC, UA 06/29/2016 3-6/hpf* 0-2/hpf Final  . RBC / HPF 06/29/2016 0-2/hpf  0-2/hpf Final  . Mucus, UA 06/29/2016 Presence of* None Final  . Squamous Epithelial / LPF 06/29/2016 Many(>10/hpf)* Rare(0-4/hpf) Final  . Bacteria, UA 06/29/2016 Many(>50/hpf)* None Final  . TSH 06/29/2016 0.17* 0.35 - 4.50 uIU/mL Final  . Free T4 06/29/2016 1.32  0.60 - 1.60 ng/dL Final   Comment: Specimens from patients who are undergoing biotin therapy and /or ingesting biotin supplements may contain high levels of biotin.  The higher biotin concentration in these specimens interferes with this Free T4 assay.  Specimens that contain high levels  of biotin may cause false high results for this Free T4 assay.  Please interpret results in light of the total clinical presentation of the patient.    . WBC 06/29/2016 5.7  4.0 - 10.5 K/uL Final  . RBC 06/29/2016 3.24* 3.87 - 5.11 Mil/uL Final  . Platelets 06/29/2016 334.0  150.0 - 400.0 K/uL Final  . Hemoglobin 06/29/2016 9.0* 12.0 - 15.0 g/dL Final  . HCT 06/29/2016 27.3* 36.0 - 46.0 % Final  . MCV 06/29/2016 84.2  78.0 - 100.0 fl Final  . MCHC 06/29/2016 33.0  30.0 - 36.0 g/dL Final  . RDW 06/29/2016 17.0* 11.5 - 15.5 % Final  Anti-coag visit on 06/27/2016  Component Date Value Ref Range Status  . INR 06/27/2016 2.2   Final    Allergies as of 07/03/2016      Reactions   Codeine    Oxycodone Hcl    Penicillins    Chlorhexidine Gluconate Rash      Medication List       Accurate as of 07/03/16  8:23 AM. Always use your most recent med list.          benazepril-hydrochlorthiazide 20-12.5 MG tablet Commonly known as:  LOTENSIN HCT TAKE 1 TABLET BY MOUTH  DAILY   CALCIUM PO Take by mouth 2 (two)  times daily.   cloNIDine 0.2 mg/24hr patch Commonly known as:  CATAPRES-TTS-2 Place 1 patch (0.2 mg total) onto the skin once a week.   diltiazem 240 MG 24 hr capsule Commonly known as:  CARDIZEM CD TAKE 1 CAPSULE BY MOUTH  DAILY   ergocalciferol 50000 units capsule Commonly known  as:  VITAMIN D2 Take 50,000 Units by mouth once a week.   ferrous sulfate 325 (65 FE) MG EC tablet Take 1 tablet (325 mg total) by mouth 2 (two) times daily.   Insulin Pen Needle 32G X 4 MM Misc Commonly known as:  BD PEN NEEDLE NANO U/F Use one per day to inject Victoza   BD PEN NEEDLE NANO U/F 32G X 4 MM Misc Generic drug:  Insulin Pen Needle USE ONE PER DAY TO INJECT  VICTOZA   metFORMIN 500 MG 24 hr tablet Commonly known as:  GLUCOPHAGE-XR TAKE 4 TABLETS BY MOUTH  DAILY WITH SUPPER.   metoprolol succinate 100 MG 24 hr tablet Commonly known as:  TOPROL-XL TAKE 1 TABLET BY MOUTH  DAILY WITH OR IMMEDIATELY  FOLLOWING A MEAL   ONETOUCH DELICA LANCETS FINE Misc Use to obtain a blood specimen 2 times per day dx code 250.02   ONETOUCH VERIO IQ SYSTEM w/Device Kit Use to check blood sugar 2 times per day dx code 250.02   ONETOUCH VERIO test strip Generic drug:  glucose blood USE AS DIRECTED TO CHECK  BLOOD SUGAR TWICE DAILY   rosuvastatin 10 MG tablet Commonly known as:  CRESTOR TAKE 1 TABLET BY MOUTH  DAILY   VICTOZA 18 MG/3ML Sopn Generic drug:  liraglutide INJECT SUBCUTANEOUSLY 1.8MG DAILY   warfarin 5 MG tablet Commonly known as:  COUMADIN Take as directed by  [facility]       Allergies:  Allergies  Allergen Reactions  . Codeine   . Oxycodone Hcl   . Penicillins   . Chlorhexidine Gluconate Rash    Past Medical History:  Diagnosis Date  . Atrial fibrillation ([place])   . Chronic disease anemia 03/29/2011  . Diabetes mellitus   . Dyslipidemia   . Hypertension   . Obesities, morbid ([place])   . Osteoarthritis     Past Surgical History:  Procedure Laterality Date  .  CHOLECYSTECTOMY    . REPLACEMENT TOTAL KNEE BILATERAL    . SHOULDER SURGERY     RIGHT SHOULDER  . TUBAL LIGATION      Family History  Problem Relation Age of Onset  . Heart attack Mother   . Stroke Mother   . Diabetes Mother   . Hypertension Brother     Social History:  reports that she has never smoked. She has never used smokeless tobacco. She reports that she does not drink alcohol or use drugs.  Review of Systems:  History of atrial fibrillation: She is on Coumadin and followed by cardiologist  Hypertension:  she has required multiple medications and blood pressure is usually well controlled She is currently taking diltiazem, metoprolol, Catapres patch and benazepril HCT She has been asked to take her medications early morning and she has usually done this except today  BP Readings from Last 3 Encounters:  07/03/16 (!) 140/100  03/03/16 128/84  12/03/15 138/84     Lipids:  well controlled with Crestor and LDL is excellent   Lab Results  Component Value Date   CHOL 140 06/29/2016   HDL 62.20 06/29/2016   LDLCALC 66 06/29/2016   TRIG 57.0 06/29/2016   CHOLHDL 2 06/29/2016    History of goiter, long-standing which is euthyroid, tends to have relatively low TSH  TSH is relatively lower and free T4 is slightly higher than before Usually has normal T3 levels No symptoms of palpitations or shakiness  No results found for: T3FREE    Lab Results  Component  Value Date   FREET4 1.32 06/29/2016   FREET4 1.15 02/28/2016   FREET4 1.10 08/30/2015   TSH 0.17 (L) 06/29/2016   TSH 0.27 (L) 02/28/2016   TSH 0.35 08/30/2015   History of anemia Previously treated with parenteral iron Hemoglobin is down significantly despite this ER Last hemoglobin by PCP in 2016   Lab Results  Component Value Date   WBC 5.7 06/29/2016   HGB 9.0 (L) 06/29/2016   HCT 27.3 (L) 06/29/2016   MCV 84.2 06/29/2016   PLT 334.0 06/29/2016      Examination:   BP (!) 140/100   Pulse  99   Ht 5' 3"  (1.6 m)   Wt 235 lb (106.6 kg)   SpO2 97%   BMI 41.63 kg/m   Body mass index is 41.63 kg/m.   Heart rate 92 and irregular  No tremor 1+ left lower leg edema but none on the right  Diabetic Foot Exam - Simple   Simple Foot Form Diabetic Foot exam was performed with the following findings:  Yes 07/03/2016  8:30 AM  Visual Inspection No deformities, no ulcerations, no other skin breakdown bilaterally:  Yes Sensation Testing Intact to touch and monofilament testing bilaterally:  Yes Pulse Check Posterior Tibialis and Dorsalis pulse intact bilaterally:  Yes Comments     ASSESSMENT/ PLAN:   Diabetes type 2 With obesity and BMI 45  Blood glucose control is fairly good with A1c below 7 consistently on the regimen of metformin and Victoza Weight is improving This is likely to be from her being active in consistently watching her diet Her sugars at home are excellent Continue same medications She thinks she can get back to the [facility] for more formal exercise   HYPERTENSION:  Blood pressure is higher than usual and diastolic has been consistently over 80 Will increase her Lotensin HCT up to 1-1/2 tablets, she will be following up with her PCP and cardiologist soon . GOITER:  Her TSH is still relatively low although not in the hyperthyroid range Will recheck labs including T3 on the next visit in 3 months    ANEMIA: She has a hemoglobin of 9 which is lower than usual despite taking ferrous sulfate, she will call hematologist for follow-up  ?  Need for GI evaluation 9 Hemoccult at least Will defer to PCP   Face-to-face evaluation and management time = 25 minutes  Maleyah Evans 07/03/2016, 8:23 AM   Note: This office note was prepared with Dragon voice recognition system technology. Any transcriptional errors that result from this process are unintentional.

## 2016-07-03 NOTE — Patient Instructions (Signed)
Take 1 1/2 Benazepril daily

## 2016-07-04 ENCOUNTER — Telehealth: Payer: Self-pay | Admitting: *Deleted

## 2016-07-04 NOTE — Telephone Encounter (Signed)
VM message from patient requesting f/u appt for her anemia. She saw her endocrinologist Dr. Lucianne MussKumar on 06/29/16 and found her HGB to be 9.0. She was advised to contact her hematologist for f/u.  Her last visit to Dr. Mosetta PuttFeng appears to have been 09/24/14

## 2016-07-04 NOTE — Telephone Encounter (Signed)
Yes, please schedule her to see me within two weeks, lab before visit.   Malachy MoodYan Genene Kilman MD

## 2016-07-05 ENCOUNTER — Telehealth: Payer: Self-pay | Admitting: Hematology

## 2016-07-05 NOTE — Telephone Encounter (Signed)
sw pt to confirm 3/16 appt at 0900 per LOS

## 2016-07-17 ENCOUNTER — Ambulatory Visit (INDEPENDENT_AMBULATORY_CARE_PROVIDER_SITE_OTHER): Payer: Medicare Other | Admitting: Pharmacist

## 2016-07-17 DIAGNOSIS — I4891 Unspecified atrial fibrillation: Secondary | ICD-10-CM | POA: Diagnosis not present

## 2016-07-17 DIAGNOSIS — Z5181 Encounter for therapeutic drug level monitoring: Secondary | ICD-10-CM

## 2016-07-17 LAB — POCT INR: INR: 2.5

## 2016-07-17 NOTE — Progress Notes (Addendum)
[facility] OFFICE PROGRESS NOTE  CC  [name], MD [address]  [place] [address] Dr. Elayne Snare  DIAGNOSIS: 66 year old female with anemia of chronic disease.  PRIOR THERAPY:  #1 patient has received Aranesp injections every 4-8 weeks her last injection was given back in January 2013.  CURRENT THERAPY:Aranesp as needed   INTERVAL HISTORY: Megan Fischer 66 y.o. female returns for Followup visit today. She was last seen by me in May 2016. She was referred back by her endocrinologist Dr. Dwyane Dee, due to her worsening anemia. She is doing well today. She felt fine, but then was told her anemia was worse. She does have a lot of fatigue that has been going on for the past month. She has had some SOB for a few months. She is not able to walk up stairs due to some left leg swelling and left knee and ankle problems. She takes 2 iron pills a day, one in the morning, one in the evening. Her diet consists of mostly greens. Her DM has been controlled. Denies chest pain, or any other concerns.   MEDICAL HISTORY: Past Medical History:  Diagnosis Date  . Atrial fibrillation ([place])   . Chronic disease anemia 03/29/2011  . Diabetes mellitus   . Dyslipidemia   . Hypertension   . Obesities, morbid ([place])   . Osteoarthritis     ALLERGIES:  is allergic to codeine; oxycodone hcl; penicillins; and chlorhexidine gluconate.  MEDICATIONS:  Current Outpatient Prescriptions  Medication Sig Dispense Refill  . BD PEN NEEDLE NANO U/F 32G X 4 MM MISC USE ONE PER DAY TO INJECT  VICTOZA 90 each 1  . benazepril-hydrochlorthiazide (LOTENSIN HCT) 20-12.5 MG tablet TAKE 1 TABLET BY MOUTH  DAILY 90 tablet 2  . Blood Glucose Monitoring Suppl (ONETOUCH VERIO IQ SYSTEM) W/DEVICE KIT Use to check blood sugar 2 times per day dx code 250.02 1 kit 0  . CALCIUM PO Take by mouth 2 (two) times daily.      . cloNIDine (CATAPRES-TTS-2) 0.2 mg/24hr patch Place 1 patch (0.2 mg total) onto the skin once  a week. 4 patch 2  . diltiazem (CARDIZEM CD) 240 MG 24 hr capsule TAKE 1 CAPSULE BY MOUTH  DAILY 90 capsule 2  . ergocalciferol (VITAMIN D2) 50000 UNITS capsule Take 50,000 Units by mouth once a week.      . ferrous sulfate 325 (65 FE) MG EC tablet Take 1 tablet (325 mg total) by mouth 2 (two) times daily. 60 tablet 3  . Insulin Pen Needle (BD PEN NEEDLE NANO U/F) 32G X 4 MM MISC Use one per day to inject Victoza 90 each 1  . metFORMIN (GLUCOPHAGE-XR) 500 MG 24 hr tablet TAKE 4 TABLETS BY MOUTH  DAILY WITH SUPPER. 360 tablet 1  . metoprolol succinate (TOPROL-XL) 100 MG 24 hr tablet TAKE 1 TABLET BY MOUTH  DAILY WITH OR IMMEDIATELY  FOLLOWING A MEAL 90 tablet 1  . ONETOUCH DELICA LANCETS FINE MISC Use to obtain a blood specimen 2 times per day dx code 250.02 200 each 0  . ONETOUCH VERIO test strip USE AS DIRECTED TO CHECK  BLOOD SUGAR TWICE DAILY 200 each 1  . rosuvastatin (CRESTOR) 10 MG tablet TAKE 1 TABLET BY MOUTH  DAILY 90 tablet 2  . VICTOZA 18 MG/3ML SOPN INJECT SUBCUTANEOUSLY 1.8MG DAILY 27 mL 2  . warfarin (COUMADIN) 5 MG tablet Take as directed by  [facility]madin Clinic 100 tablet 1   No current facility-administered medications  for this visit.     SURGICAL HISTORY:  Past Surgical History:  Procedure Laterality Date  . CHOLECYSTECTOMY    . REPLACEMENT TOTAL KNEE BILATERAL    . SHOULDER SURGERY     RIGHT SHOULDER  . TUBAL LIGATION      REVIEW OF SYSTEMS:  Review of Systems  Constitutional: Positive for malaise/fatigue.  HENT: Negative.   Eyes: Negative.   Respiratory: Positive for shortness of breath.   Cardiovascular: Positive for leg swelling (Left). Negative for chest pain.  Gastrointestinal: Negative.  Negative for blood in stool.  Genitourinary: Negative.   Musculoskeletal: Positive for joint pain (L knee, L ankle).  Skin: Negative.   Neurological: Negative.   Endo/Heme/Allergies: Negative.  Does not bruise/bleed easily.  Psychiatric/Behavioral: Negative.   All other  systems reviewed and are negative. A 10 point review of systems was conducted and is otherwise negative except for what is noted above.    PHYSICAL EXAMINATION:   BP (!) 149/84 (BP Location: Left Arm, Patient Position: Sitting)   Pulse 87   Temp 98.7 F (37.1 C) (Oral)   Resp 19   Ht 5' 3"  (1.6 m)   Wt 231 lb (104.8 kg)   BMI 40.92 kg/m    General: Patient is a well appearing female in no acute distress HEENT: PERRLA, sclerae anicteric no conjunctival pallor, MMM, does have some thyromegaly Neck: supple, no palpable adenopathy Lungs: clear to auscultation bilaterally, no wheezes, rhonchi, or rales Cardiovascular: regular rate rhythm, S1, S2, no murmurs, rubs or gallops (+) atrial fibrillation  Abdomen: Soft, non-tender, non-distended, normoactive bowel sounds, no HSM Extremities: warm and well perfused, no clubbing, cyanosis, or edema Skin: No rashes or lesions Neuro: Non-focal ECOG PERFORMANCE STATUS: 1 - Symptomatic but completely ambulatory  LABORATORY DATA: CBC Latest Ref Rng & Units 07/21/2016 06/29/2016 09/24/2014  WBC 3.9 - 10.3 10e3/uL 6.1 5.7 6.5  Hemoglobin 11.6 - 15.9 g/dL 9.4(L) 9.0(L) 11.1(L)  Hematocrit 34.8 - 46.6 % 29.7(L) 27.3(L) 34.3(L)  Platelets 145 - 400 10e3/uL 341 334.0 259    CMP Latest Ref Rng & Units 07/21/2016 06/29/2016 02/28/2016  Glucose 70 - 140 mg/dl 128 97 118(H)  BUN 7.0 - 26.0 mg/dL 26.6(H) 22 25(H)  Creatinine 0.6 - 1.1 mg/dL 1.1 1.03 1.11  Sodium 136 - 145 mEq/L 140 143 141  Potassium 3.5 - 5.1 mEq/L 3.7 3.9 3.6  Chloride 96 - 112 mEq/L - 107 106  CO2 22 - 29 mEq/L 24 28 28   Calcium 8.4 - 10.4 mg/dL 10.3 9.4 9.4  Total Protein 6.4 - 8.3 g/dL 8.1 7.0 -  Total Bilirubin 0.20 - 1.20 mg/dL 0.62 0.4 -  Alkaline Phos 40 - 150 U/L 73 61 -  AST 5 - 34 U/L 23 18 -  ALT 0 - 55 U/L 18 13 -   RADIOGRAPHIC STUDIES:  No results found.  ASSESSMENT: 66 y.o. female   1. Anemia of chronic disease. -Her initial workup showed normal ferritin and  serum iron level, slightly low iron saturation, normal W46 and folic acid. Normal erythropoietin level. SPEP was negative.  -Given her multiple comorbidities, especially diabetes, this is likely anemia of chronic disease. -Low risk is also a possibility. Given her stable mild anemia, no other cytopenia, I'll hold bone marrow biopsy for now. We discussed she may need a bone marrow biopsy if her anemia gets much worse, or if she develops other cytopenia. -She responded well to Aranesp injection, and has not required any treatment since 2013, but she also  lost to follow-up. -Labs reviewed, her anemia is worse than when I saw her in 2016. Hgb 9.4 today. I have repeated her iron study, P84, and folic acid level. Results are still pending  -I recommend her to restart Aranesp injection in 2 weeks, 191mg every 4 weeks -Check labs every 4 weeks to see if she needs another Aranesp injection.   -Her last colonoscopy was several years. I have encouraged her to get one soon. She will call Dr. MLorie Apleyoffice -She gets annual mammograms.  -She takes oral iron daily   2. Diabetes, hypertension, A. fib on Coumadin, morbid RBC -She will continue follow-up with her primary care physician, endocrinologist and cardiologist. -Controlled   Plan -Aranesp injection in 2 weeks, 1523m every 4 weeks  -Check labs every 4 weeks and continue Aranesp if Hb<11 -I'll see her back in 6 weeks with lab    I spent 20 minutes counseling the patient face to face. The total time spent in the appointment was 30 minutes.  This document serves as a record of services personally performed by YaTruitt MerleMD. It was created on her behalf by JoMartiniqueasey, a trained medical scribe. The creation of this record is based on the scribe's personal observations and the provider's statements to them. This document has been checked and approved by the attending provider.  I have reviewed the above documentation for accuracy and completeness and I  agree with the above.  FeTruitt Merle Addendum Patients on study showed low iron and saturation. I spoke with patient, she has been taking oral iron tablets twice a day, I recommend her get IV Feraheme. She tolerated IV iron very well before.Potential side effects discussed with her.  She agrees with the plan. I plan to give IV Feraheme weekly for 2 weeks in next few weeks.  FeTruitt Merle3/19/2018

## 2016-07-20 DIAGNOSIS — D638 Anemia in other chronic diseases classified elsewhere: Secondary | ICD-10-CM | POA: Insufficient documentation

## 2016-07-21 ENCOUNTER — Encounter: Payer: Self-pay | Admitting: Hematology

## 2016-07-21 ENCOUNTER — Telehealth: Payer: Self-pay | Admitting: Hematology

## 2016-07-21 ENCOUNTER — Ambulatory Visit (HOSPITAL_BASED_OUTPATIENT_CLINIC_OR_DEPARTMENT_OTHER): Payer: Medicare Other | Admitting: Hematology

## 2016-07-21 ENCOUNTER — Other Ambulatory Visit (HOSPITAL_BASED_OUTPATIENT_CLINIC_OR_DEPARTMENT_OTHER): Payer: Medicare Other

## 2016-07-21 DIAGNOSIS — I10 Essential (primary) hypertension: Secondary | ICD-10-CM | POA: Diagnosis not present

## 2016-07-21 DIAGNOSIS — I4891 Unspecified atrial fibrillation: Secondary | ICD-10-CM

## 2016-07-21 DIAGNOSIS — D638 Anemia in other chronic diseases classified elsewhere: Secondary | ICD-10-CM | POA: Diagnosis not present

## 2016-07-21 DIAGNOSIS — E119 Type 2 diabetes mellitus without complications: Secondary | ICD-10-CM

## 2016-07-21 LAB — CBC & DIFF AND RETIC
BASO%: 0.3 % (ref 0.0–2.0)
Basophils Absolute: 0 10*3/uL (ref 0.0–0.1)
EOS%: 0.8 % (ref 0.0–7.0)
Eosinophils Absolute: 0.1 10*3/uL (ref 0.0–0.5)
HCT: 29.7 % — ABNORMAL LOW (ref 34.8–46.6)
HGB: 9.4 g/dL — ABNORMAL LOW (ref 11.6–15.9)
IMMATURE RETIC FRACT: 11.1 % — AB (ref 1.60–10.00)
LYMPH%: 37.5 % (ref 14.0–49.7)
MCH: 26.6 pg (ref 25.1–34.0)
MCHC: 31.6 g/dL (ref 31.5–36.0)
MCV: 83.9 fL (ref 79.5–101.0)
MONO#: 0.3 10*3/uL (ref 0.1–0.9)
MONO%: 4.9 % (ref 0.0–14.0)
NEUT%: 56.5 % (ref 38.4–76.8)
NEUTROS ABS: 3.5 10*3/uL (ref 1.5–6.5)
Platelets: 341 10*3/uL (ref 145–400)
RBC: 3.54 10*6/uL — AB (ref 3.70–5.45)
RDW: 16.4 % — ABNORMAL HIGH (ref 11.2–14.5)
Retic %: 2.35 % — ABNORMAL HIGH (ref 0.70–2.10)
Retic Ct Abs: 83.19 10*3/uL (ref 33.70–90.70)
WBC: 6.1 10*3/uL (ref 3.9–10.3)
lymph#: 2.3 10*3/uL (ref 0.9–3.3)

## 2016-07-21 LAB — COMPREHENSIVE METABOLIC PANEL
ALT: 18 U/L (ref 0–55)
ANION GAP: 12 meq/L — AB (ref 3–11)
AST: 23 U/L (ref 5–34)
Albumin: 4 g/dL (ref 3.5–5.0)
Alkaline Phosphatase: 73 U/L (ref 40–150)
BILIRUBIN TOTAL: 0.62 mg/dL (ref 0.20–1.20)
BUN: 26.6 mg/dL — AB (ref 7.0–26.0)
CALCIUM: 10.3 mg/dL (ref 8.4–10.4)
CO2: 24 mEq/L (ref 22–29)
CREATININE: 1.1 mg/dL (ref 0.6–1.1)
Chloride: 104 mEq/L (ref 98–109)
EGFR: 60 mL/min/{1.73_m2} — ABNORMAL LOW (ref >90)
Glucose: 128 mg/dl (ref 70–140)
Potassium: 3.7 mEq/L (ref 3.5–5.1)
Sodium: 140 mEq/L (ref 136–145)
TOTAL PROTEIN: 8.1 g/dL (ref 6.4–8.3)

## 2016-07-21 LAB — IRON AND TIBC
%SAT: 10 % — ABNORMAL LOW (ref 21–57)
Iron: 35 ug/dL — ABNORMAL LOW (ref 41–142)
TIBC: 371 ug/dL (ref 236–444)
UIBC: 336 ug/dL (ref 120–384)

## 2016-07-21 LAB — FERRITIN: FERRITIN: 52 ng/mL (ref 9–269)

## 2016-07-21 NOTE — Telephone Encounter (Signed)
Appointments scheduled per 3.16.18 LOS. Patient given AVS report and calendars with future scheduled appointments.  °

## 2016-07-22 LAB — ERYTHROPOIETIN: Erythropoietin: 109.6 m[IU]/mL — ABNORMAL HIGH (ref 2.6–18.5)

## 2016-07-24 ENCOUNTER — Telehealth: Payer: Self-pay | Admitting: Hematology

## 2016-07-24 LAB — FOLATE RBC
FOLATE, HEMOLYSATE: 446 ng/mL
Folate, RBC: 1527 ng/mL (ref >498)
HEMATOCRIT: 29.2 % — AB (ref 34.0–46.6)

## 2016-07-24 NOTE — Telephone Encounter (Signed)
sw pt re infusion appt 3/23 and 3/30 at 10 am per LOS

## 2016-07-25 LAB — METHYLMALONIC ACID, SERUM: Methylmalonic Acid, Serum: 193 nmol/L (ref 0–378)

## 2016-07-27 ENCOUNTER — Other Ambulatory Visit: Payer: Self-pay | Admitting: Hematology

## 2016-07-28 ENCOUNTER — Ambulatory Visit (HOSPITAL_BASED_OUTPATIENT_CLINIC_OR_DEPARTMENT_OTHER): Payer: Medicare Other

## 2016-07-28 VITALS — BP 140/88 | HR 93 | Temp 98.0°F | Resp 20

## 2016-07-28 DIAGNOSIS — D638 Anemia in other chronic diseases classified elsewhere: Secondary | ICD-10-CM

## 2016-07-28 DIAGNOSIS — D509 Iron deficiency anemia, unspecified: Secondary | ICD-10-CM | POA: Diagnosis not present

## 2016-07-28 MED ORDER — SODIUM CHLORIDE 0.9% FLUSH
3.0000 mL | Freq: Once | INTRAVENOUS | Status: DC | PRN
  Filled 2016-07-28: qty 10

## 2016-07-28 MED ORDER — SODIUM CHLORIDE 0.9 % IV SOLN
510.0000 mg | Freq: Once | INTRAVENOUS | Status: AC
  Administered 2016-07-28: 510 mg via INTRAVENOUS
  Filled 2016-07-28: qty 17

## 2016-07-28 MED ORDER — SODIUM CHLORIDE 0.9 % IV SOLN
Freq: Once | INTRAVENOUS | Status: AC
  Administered 2016-07-28: 10:00:00 via INTRAVENOUS

## 2016-07-28 NOTE — Patient Instructions (Addendum)

## 2016-08-02 ENCOUNTER — Telehealth: Payer: Self-pay | Admitting: Endocrinology

## 2016-08-02 ENCOUNTER — Other Ambulatory Visit: Payer: Self-pay

## 2016-08-02 MED ORDER — GLUCOSE BLOOD VI STRP: ORAL_STRIP | 2 refills | Status: DC

## 2016-08-02 NOTE — Telephone Encounter (Signed)
Ordered

## 2016-08-02 NOTE — Telephone Encounter (Signed)
Pt called in requesting a prescription for her One Touch Verio Test Strips, testing 2x daily, for a 3 month supply, [facility] [place]. She is completely out.

## 2016-08-04 ENCOUNTER — Ambulatory Visit (HOSPITAL_BASED_OUTPATIENT_CLINIC_OR_DEPARTMENT_OTHER): Payer: Medicare Other

## 2016-08-04 ENCOUNTER — Ambulatory Visit: Payer: Medicare Other

## 2016-08-04 VITALS — BP 121/62 | HR 100 | Temp 98.5°F | Resp 16

## 2016-08-04 VITALS — BP 140/77 | HR 95 | Temp 98.7°F | Resp 18

## 2016-08-04 DIAGNOSIS — D638 Anemia in other chronic diseases classified elsewhere: Secondary | ICD-10-CM

## 2016-08-04 DIAGNOSIS — I4891 Unspecified atrial fibrillation: Secondary | ICD-10-CM

## 2016-08-04 DIAGNOSIS — D509 Iron deficiency anemia, unspecified: Secondary | ICD-10-CM | POA: Diagnosis not present

## 2016-08-04 MED ORDER — DARBEPOETIN ALFA 150 MCG/0.3ML IJ SOSY
150.0000 ug | PREFILLED_SYRINGE | Freq: Once | INTRAMUSCULAR | Status: AC
  Administered 2016-08-04: 150 ug via SUBCUTANEOUS
  Filled 2016-08-04: qty 0.3

## 2016-08-04 MED ORDER — SODIUM CHLORIDE 0.9 % IV SOLN
Freq: Once | INTRAVENOUS | Status: AC
  Administered 2016-08-04: 09:00:00 via INTRAVENOUS

## 2016-08-04 MED ORDER — SODIUM CHLORIDE 0.9 % IV SOLN
510.0000 mg | Freq: Once | INTRAVENOUS | Status: AC
  Administered 2016-08-04: 510 mg via INTRAVENOUS
  Filled 2016-08-04: qty 17

## 2016-08-04 NOTE — Patient Instructions (Signed)

## 2016-08-04 NOTE — Patient Instructions (Signed)

## 2016-08-07 ENCOUNTER — Other Ambulatory Visit: Payer: Self-pay

## 2016-08-07 MED ORDER — GLUCOSE BLOOD VI STRP: ORAL_STRIP | 2 refills | Status: DC

## 2016-08-14 ENCOUNTER — Ambulatory Visit (INDEPENDENT_AMBULATORY_CARE_PROVIDER_SITE_OTHER): Payer: Medicare Other | Admitting: *Deleted

## 2016-08-14 DIAGNOSIS — I4891 Unspecified atrial fibrillation: Secondary | ICD-10-CM

## 2016-08-14 DIAGNOSIS — Z5181 Encounter for therapeutic drug level monitoring: Secondary | ICD-10-CM

## 2016-08-14 LAB — POCT INR: INR: 1.6

## 2016-08-17 ENCOUNTER — Other Ambulatory Visit: Payer: Self-pay

## 2016-08-17 MED ORDER — GLUCOSE BLOOD VI STRP: ORAL_STRIP | 2 refills | Status: DC

## 2016-08-28 ENCOUNTER — Ambulatory Visit (INDEPENDENT_AMBULATORY_CARE_PROVIDER_SITE_OTHER): Payer: Medicare Other | Admitting: *Deleted

## 2016-08-28 DIAGNOSIS — I4891 Unspecified atrial fibrillation: Secondary | ICD-10-CM

## 2016-08-28 DIAGNOSIS — Z5181 Encounter for therapeutic drug level monitoring: Secondary | ICD-10-CM

## 2016-08-28 LAB — POCT INR: INR: 2

## 2016-08-31 NOTE — Progress Notes (Signed)
[facility] OFFICE PROGRESS NOTE  CC  Megan Hatchet, MD [place] [address] Dr. Elayne Fischer  DIAGNOSIS: 66 y.o. female with anemia of chronic disease.  PRIOR THERAPY:  #1 patient has received Aranesp injections every 4-8 weeks her last injection was given on 08/04/16 and prior was in January 2013.  #2 patient received IV Feraheme on 07/28/16 and 08/04/16  CURRENT THERAPY:Aranesp as needed   INTERVAL HISTORY: Megan Fischer 66 y.o. female returns for followup visit. She was referred back by her endocrinologist, Dr. Dwyane Fischer, due to her worsening anemia on 07/21/16. The patient started feeling somewhat better after her infusions of IV Feraheme in late March 2013. She has a little more energy. The patient is taking 1 iron pill a day.  MEDICAL HISTORY: Past Medical History:  Diagnosis Date  . Atrial fibrillation ([place])   . Chronic disease anemia 03/29/2011  . Diabetes mellitus   . Dyslipidemia   . Hypertension   . Obesities, morbid ([place])   . Osteoarthritis     ALLERGIES:  is allergic to codeine; oxycodone hcl; penicillins; and chlorhexidine gluconate.  MEDICATIONS:  Current Outpatient Prescriptions  Medication Sig Dispense Refill  . BD PEN NEEDLE NANO U/F 32G X 4 MM MISC USE ONE PER DAY TO INJECT  VICTOZA 90 each 1  . benazepril-hydrochlorthiazide (LOTENSIN HCT) 20-12.5 MG tablet TAKE 1 TABLET BY MOUTH  DAILY 90 tablet 2  . Blood Glucose Monitoring Suppl (ONETOUCH VERIO IQ SYSTEM) W/DEVICE KIT Use to check blood sugar 2 times per day dx code 250.02 1 kit 0  . CALCIUM PO Take by mouth 2 (two) times daily.      . cloNIDine (CATAPRES-TTS-2) 0.2 mg/24hr patch Place 1 patch (0.2 mg total) onto the skin once a week. 4 patch 2  . diltiazem (CARDIZEM CD) 240 MG 24 hr capsule TAKE 1 CAPSULE BY MOUTH  DAILY 90 capsule 2  . ergocalciferol (VITAMIN D2) 50000 UNITS capsule Take 50,000 Units by mouth once a week.      . ferrous sulfate 325 (65 FE) MG EC tablet  Take 1 tablet (325 mg total) by mouth 2 (two) times daily. 60 tablet 3  . glucose blood (ONETOUCH VERIO) test strip USE AS DIRECTED TO CHECK  BLOOD SUGAR TWICE DAILY DX CODE- E11.65 200 each 2  . Insulin Pen Needle (BD PEN NEEDLE NANO U/F) 32G X 4 MM MISC Use one per day to inject Victoza 90 each 1  . metFORMIN (GLUCOPHAGE-XR) 500 MG 24 hr tablet TAKE 4 TABLETS BY MOUTH  DAILY WITH SUPPER. 360 tablet 1  . metoprolol succinate (TOPROL-XL) 100 MG 24 hr tablet TAKE 1 TABLET BY MOUTH  DAILY WITH OR IMMEDIATELY  FOLLOWING A MEAL 90 tablet 1  . ONETOUCH DELICA LANCETS FINE MISC Use to obtain a blood specimen 2 times per day dx code 250.02 200 each 0  . rosuvastatin (CRESTOR) 10 MG tablet TAKE 1 TABLET BY MOUTH  DAILY 90 tablet 2  . VICTOZA 18 MG/3ML SOPN INJECT SUBCUTANEOUSLY 1.8MG DAILY 27 mL 2  . warfarin (COUMADIN) 5 MG tablet Take as directed by  [facility] 100 tablet 1   No current facility-administered medications for this visit.     SURGICAL HISTORY:  Past Surgical History:  Procedure Laterality Date  . CHOLECYSTECTOMY    . REPLACEMENT TOTAL KNEE BILATERAL    . SHOULDER SURGERY     RIGHT SHOULDER  . TUBAL LIGATION      REVIEW OF SYSTEMS:  Review of Systems  Constitutional: Positive for malaise/fatigue.  HENT: Negative.   Eyes: Negative.   Respiratory: Positive for shortness of breath.   Cardiovascular: Positive for leg swelling (Left). Negative for chest pain.  Gastrointestinal: Negative.  Negative for blood in stool.  Genitourinary: Negative.   Musculoskeletal: Positive for joint pain (L knee, L ankle).  Skin: Negative.   Neurological: Negative.   Endo/Heme/Allergies: Negative.  Does not bruise/bleed easily.  Psychiatric/Behavioral: Negative.   All other systems reviewed and are negative. A 10 point review of systems was conducted and is otherwise negative except for what is noted above.    PHYSICAL EXAMINATION:   BP 133/89 (BP Location: Left Arm, Patient Position:  Sitting)   Pulse (!) 103   Temp 98.1 F (36.7 C) (Oral)   Resp 17   Ht _0  (1.6 m)   Wt 228 lb 12.8 oz (103.8 kg)   SpO2 100%   BMI 40.53 kg/m    General: Patient is a well appearing female in no acute distress HEENT: PERRLA, sclerae anicteric no conjunctival pallor, MMM, does have some thyromegaly Neck: supple, no palpable adenopathy Lungs: clear to auscultation bilaterally, no wheezes, rhonchi, or rales Cardiovascular: regular rate rhythm, S1, S2, no murmurs, rubs or gallops (+) atrial fibrillation  Abdomen: Soft, non-tender, non-distended, normoactive bowel sounds, no HSM Extremities: warm and well perfused, no clubbing, cyanosis, or edema Skin: No rashes or lesions Neuro: Non-focal ECOG PERFORMANCE STATUS: 1 - Symptomatic but completely ambulatory  LABORATORY DATA: CBC Latest Ref Rng & Units 09/01/2016 07/21/2016 07/21/2016  WBC 3.9 - 10.3 10e3/uL 4.7 6.1 -  Hemoglobin 11.6 - 15.9 g/dL 12.2 9.4(L) -  Hematocrit 34.8 - 46.6 % 37.9 29.7(L) 29.2(L)  Platelets 145 - 400 10e3/uL 243 341 -    CMP Latest Ref Rng & Units 07/21/2016 06/29/2016 02/28/2016  Glucose 70 - 140 mg/dl 128 97 118(H)  BUN 7.0 - 26.0 mg/dL 26.6(H) 22 25(H)  Creatinine 0.6 - 1.1 mg/dL 1.1 1.03 1.11  Sodium 136 - 145 mEq/L 140 143 141  Potassium 3.5 - 5.1 mEq/L 3.7 3.9 3.6  Chloride 96 - 112 mEq/L - 107 106  CO2 22 - 29 mEq/L _1 Calcium 8.4 - 10.4 mg/dL 10.3 9.4 9.4  Total Protein 6.4 - 8.3 g/dL 8.1 7.0 -  Total Bilirubin 0.20 - 1.20 mg/dL 0.62 0.4 -  Alkaline Phos 40 - 150 U/L 73 61 -  AST 5 - 34 U/L 23 18 -  ALT 0 - 55 U/L 18 13 -   Results for Megan Fischer (MRN [id number]) as of 08/31/2016 08:53  Ref. Range 07/21/2016 08:56  Iron Latest Ref Range: 41 - 142 ug/dL 35 (L)  UIBC Latest Ref Range: 120 - 384 ug/dL 336  TIBC Latest Ref Range: 236 - 444 ug/dL 371  %SAT Latest Ref Range: 21 - 57 % 10 (L)  Ferritin Latest Ref Range: 9 - 269 ng/ml 52  Folate, Hemolysate Latest Ref Range: Not Estab.  ng/mL 446.0  Folate, RBC Latest Ref Range: >498 ng/mL 1,527    RADIOGRAPHIC STUDIES:  No results found.  ASSESSMENT: 66 y.o. female   1. Anemia of chronic disease and iron deficiency  -Her initial workup showed normal ferritin and serum iron level, slightly low iron saturation, normal G86 and folic acid. Normal erythropoietin level. SPEP was negative.  -Given her multiple comorbidities, especially diabetes, this is likely anemia of chronic disease. -Low risk is also a possibility. Given her stable mild anemia, no  other cytopenia, I'll hold bone marrow biopsy for now. We discussed she may need a bone marrow biopsy if her anemia gets much worse, or if she develops other cytopenia. -She responded well to Aranesp injection, and has not required any treatment since 2013, but she also lost to follow-up. -Labs reviewed on 07/21/16. Her anemia was worse than when I saw her in 2016. Hgb 9.4 that day. I repeated her iron study, X79, and folic acid level.  -Iron low at 35 ug/dL, %SAT low at 10%, Folate RBC low at 29.2%, Methylmalonic Acid (B12) was normal at 193 nmol/L. -she has restart Aranesp injection 156mg every 4-8 weeks on 08/04/16. --Labs reviewed, Hb 12.2 on 09/01/16, no Aranesp injection neededTODAY. -Check labs every 2 months to see if she needs another Aranesp injection if Hb<11. -Her last colonoscopy was several years. I have encouraged her to get one soon. She will call Dr. MLorie Apleyoffice. -She gets annual mammograms.  -She takes oral iron daily, will continue   2. Diabetes, hypertension, A. fib on Coumadin, morbid RBC -She will continue follow-up with her primary care physician, endocrinologist and cardiologist. -Controlled   Plan -Check labs CBC and iron study every 2 months and continue Aranesp if Hb<11 -Consider IV Feraheme if ferritin less than 100 or low serum iron  -I asked the patient to call me after each lab test to see if she needs IV iron. -I'll see her back in 6  months -Hb 12.2 TODAY. No Aranesp injection today.  I spent 15 minutes counseling the patient face to face. The total time spent in the appointment was 20 minutes.  FTruitt Merle 09/01/2016  This document serves as a record of services personally performed by YTruitt Merle MD. It was created on her behalf by JDarcus Austin a trained medical scribe. The creation of this record is based on the scribe's personal observations and the provider's statements to them. This document has been checked and approved by the attending provider.

## 2016-09-01 ENCOUNTER — Ambulatory Visit: Payer: Medicare Other

## 2016-09-01 ENCOUNTER — Telehealth: Payer: Self-pay | Admitting: Hematology

## 2016-09-01 ENCOUNTER — Ambulatory Visit (HOSPITAL_BASED_OUTPATIENT_CLINIC_OR_DEPARTMENT_OTHER): Payer: Medicare Other | Admitting: Hematology

## 2016-09-01 ENCOUNTER — Encounter: Payer: Self-pay | Admitting: Hematology

## 2016-09-01 ENCOUNTER — Other Ambulatory Visit (HOSPITAL_BASED_OUTPATIENT_CLINIC_OR_DEPARTMENT_OTHER): Payer: Medicare Other

## 2016-09-01 VITALS — BP 133/89 | HR 103 | Temp 98.1°F | Resp 17 | Ht 63.0 in | Wt 228.8 lb

## 2016-09-01 DIAGNOSIS — D609 Acquired pure red cell aplasia, unspecified: Secondary | ICD-10-CM

## 2016-09-01 DIAGNOSIS — D638 Anemia in other chronic diseases classified elsewhere: Secondary | ICD-10-CM

## 2016-09-01 DIAGNOSIS — I10 Essential (primary) hypertension: Secondary | ICD-10-CM | POA: Diagnosis not present

## 2016-09-01 DIAGNOSIS — D509 Iron deficiency anemia, unspecified: Secondary | ICD-10-CM

## 2016-09-01 DIAGNOSIS — E119 Type 2 diabetes mellitus without complications: Secondary | ICD-10-CM | POA: Diagnosis not present

## 2016-09-01 LAB — CBC & DIFF AND RETIC
BASO%: 0.4 % (ref 0.0–2.0)
BASOS ABS: 0 10*3/uL (ref 0.0–0.1)
EOS%: 1.3 % (ref 0.0–7.0)
Eosinophils Absolute: 0.1 10*3/uL (ref 0.0–0.5)
HEMATOCRIT: 37.9 % (ref 34.8–46.6)
HGB: 12.2 g/dL (ref 11.6–15.9)
Immature Retic Fract: 4.8 % (ref 1.60–10.00)
LYMPH%: 31.9 % (ref 14.0–49.7)
MCH: 27.8 pg (ref 25.1–34.0)
MCHC: 32.2 g/dL (ref 31.5–36.0)
MCV: 86.3 fL (ref 79.5–101.0)
MONO#: 0.3 10*3/uL (ref 0.1–0.9)
MONO%: 6.1 % (ref 0.0–14.0)
NEUT#: 2.9 10*3/uL (ref 1.5–6.5)
NEUT%: 60.3 % (ref 38.4–76.8)
PLATELETS: 243 10*3/uL (ref 145–400)
RBC: 4.39 10*6/uL (ref 3.70–5.45)
RDW: 19.9 % — AB (ref 11.2–14.5)
Retic %: 0.94 % (ref 0.70–2.10)
Retic Ct Abs: 41.27 10*3/uL (ref 33.70–90.70)
WBC: 4.7 10*3/uL (ref 3.9–10.3)
lymph#: 1.5 10*3/uL (ref 0.9–3.3)

## 2016-09-01 LAB — COMPREHENSIVE METABOLIC PANEL
ALBUMIN: 4.1 g/dL (ref 3.5–5.0)
ALK PHOS: 68 U/L (ref 40–150)
ALT: 15 U/L (ref 0–55)
ANION GAP: 11 meq/L (ref 3–11)
AST: 22 U/L (ref 5–34)
BILIRUBIN TOTAL: 0.39 mg/dL (ref 0.20–1.20)
BUN: 23.2 mg/dL (ref 7.0–26.0)
CO2: 26 meq/L (ref 22–29)
CREATININE: 1 mg/dL (ref 0.6–1.1)
Calcium: 10.2 mg/dL (ref 8.4–10.4)
Chloride: 106 mEq/L (ref 98–109)
EGFR: 66 mL/min/{1.73_m2} — ABNORMAL LOW (ref >90)
GLUCOSE: 96 mg/dL (ref 70–140)
Potassium: 3.9 mEq/L (ref 3.5–5.1)
Sodium: 143 mEq/L (ref 136–145)
TOTAL PROTEIN: 7.9 g/dL (ref 6.4–8.3)

## 2016-09-01 LAB — FERRITIN: FERRITIN: 532 ng/mL — AB (ref 9–269)

## 2016-09-01 LAB — IRON AND TIBC
%SAT: 20 % — AB (ref 21–57)
Iron: 54 ug/dL (ref 41–142)
TIBC: 270 ug/dL (ref 236–444)
UIBC: 217 ug/dL (ref 120–384)

## 2016-09-01 NOTE — Telephone Encounter (Signed)
Gave patient avs report and appointments for June thru October

## 2016-09-18 ENCOUNTER — Ambulatory Visit (INDEPENDENT_AMBULATORY_CARE_PROVIDER_SITE_OTHER): Payer: Medicare Other | Admitting: *Deleted

## 2016-09-18 DIAGNOSIS — I4891 Unspecified atrial fibrillation: Secondary | ICD-10-CM

## 2016-09-18 DIAGNOSIS — Z5181 Encounter for therapeutic drug level monitoring: Secondary | ICD-10-CM

## 2016-09-18 LAB — POCT INR: INR: 3.1

## 2016-09-25 ENCOUNTER — Other Ambulatory Visit (INDEPENDENT_AMBULATORY_CARE_PROVIDER_SITE_OTHER): Payer: Medicare Other

## 2016-09-25 DIAGNOSIS — R7989 Other specified abnormal findings of blood chemistry: Secondary | ICD-10-CM

## 2016-09-25 DIAGNOSIS — R946 Abnormal results of thyroid function studies: Secondary | ICD-10-CM | POA: Diagnosis not present

## 2016-09-25 DIAGNOSIS — E1165 Type 2 diabetes mellitus with hyperglycemia: Secondary | ICD-10-CM

## 2016-09-25 LAB — BASIC METABOLIC PANEL
BUN: 24 mg/dL — AB (ref 6–23)
CHLORIDE: 105 meq/L (ref 96–112)
CO2: 28 meq/L (ref 19–32)
Calcium: 10.4 mg/dL (ref 8.4–10.5)
Creatinine, Ser: 1.13 mg/dL (ref 0.40–1.20)
GFR: 62.04 mL/min (ref >60.00)
GLUCOSE: 101 mg/dL — AB (ref 70–99)
POTASSIUM: 3.5 meq/L (ref 3.5–5.1)
Sodium: 143 mEq/L (ref 135–145)

## 2016-09-25 LAB — TSH: TSH: 0.21 u[IU]/mL — ABNORMAL LOW (ref 0.35–4.50)

## 2016-09-25 LAB — T4, FREE: Free T4: 1.23 ng/dL (ref 0.60–1.60)

## 2016-09-25 LAB — HEMOGLOBIN A1C: Hgb A1c MFr Bld: 6.6 % — ABNORMAL HIGH (ref 4.6–6.5)

## 2016-09-25 LAB — T3, FREE: T3 FREE: 3.2 pg/mL (ref 2.3–4.2)

## 2016-09-26 ENCOUNTER — Other Ambulatory Visit: Payer: Medicare Other

## 2016-09-29 ENCOUNTER — Encounter: Payer: Self-pay | Admitting: Endocrinology

## 2016-09-29 ENCOUNTER — Ambulatory Visit (INDEPENDENT_AMBULATORY_CARE_PROVIDER_SITE_OTHER): Payer: Medicare Other | Admitting: Endocrinology

## 2016-09-29 VITALS — BP 128/86 | HR 80 | Ht 63.0 in | Wt 230.2 lb

## 2016-09-29 DIAGNOSIS — N181 Chronic kidney disease, stage 1: Secondary | ICD-10-CM

## 2016-09-29 DIAGNOSIS — E1122 Type 2 diabetes mellitus with diabetic chronic kidney disease: Secondary | ICD-10-CM

## 2016-09-29 DIAGNOSIS — I10 Essential (primary) hypertension: Secondary | ICD-10-CM

## 2016-09-29 MED ORDER — DILTIAZEM HCL ER COATED BEADS 360 MG PO CP24: 360.0000 mg | ORAL_CAPSULE | Freq: Every day | ORAL | 1 refills | Status: DC

## 2016-09-29 MED ORDER — DILTIAZEM HCL ER COATED BEADS 120 MG PO CP24: 120.0000 mg | ORAL_CAPSULE | Freq: Every day | ORAL | 1 refills | Status: DC

## 2016-09-29 NOTE — Patient Instructions (Signed)
Diltiazem 240 plus 120mg  daily

## 2016-09-29 NOTE — Progress Notes (Signed)
Patient ID: Megan Fischer, female   DOB: 26-Jul-1950, 66 y.o.   MRN: [id number]   Reason for Appointment: Diabetes follow-up   History of Present Illness   Diagnosis: Type 2 DIABETES MELITUS, date of diagnosis:  2000     Previous history: She has been on various regimens of treatment in the past including Byetta and generally has done better with GLP-1 drugs She continues to take low-dose glimepiride which helps with overall control. Usually blood sugars are not variable Because of occasional hypoglycemia her Amaryl was stopped in 03/2014  Recent history: Oral hypoglycemic drugs: Metformin ER 500 mg, 4 tablets at dinner       Side effects from medications: None  She has been on Victoza since at least 2013 with good control,  taking 1.8 mg in the mornings daily  Her A1c is stable at 6.6  Current blood sugar patterns and problems identified:  She did not bring her monitor for download today but has only occasional relatively high postprandial readings at home, may have higher readings if she goes off her diet or drinking a sweet drink  Continues on metformin, 2000 mg and Victoza 1.8 mg without side effects  Her weight is about the same  Monitors blood glucose: once or twice a day.    Glucometer:  One Touch   Readings by recall:  PRE-MEAL Fasting Lunch Dinner Bedtime Overall  Glucose range: 95-107      Mean/median:        POST-MEAL PC Breakfast PC Lunch PC Dinner  Glucose range:   140-189   Mean/median:         Wt Readings from Last 3 Encounters:  09/29/16 230 lb 3.2 oz (104.4 kg)  09/01/16 228 lb 12.8 oz (103.8 kg)  07/21/16 231 lb (104.8 kg)   Lab Results  Component Value Date   HGBA1C 6.6 (H) 09/25/2016   HGBA1C 6.6 (H) 06/29/2016   HGBA1C 6.8 (H) 02/28/2016   Lab Results  Component Value Date   MICROALBUR 10.5 (H) 06/29/2016   LDLCALC 66 06/29/2016   CREATININE 1.13 09/25/2016   Other problems discussed today: See review of systems    Lab on  09/25/2016  Component Date Value Ref Range Status  . Hgb A1c MFr Bld 09/25/2016 6.6* 4.6 - 6.5 % Final   Glycemic Control Guidelines for People with Diabetes:Non Diabetic:  <6%Goal of Therapy: <7%Additional Action Suggested:  >8%   . Sodium 09/25/2016 143  135 - 145 mEq/L Final  . Potassium 09/25/2016 3.5  3.5 - 5.1 mEq/L Final  . Chloride 09/25/2016 105  96 - 112 mEq/L Final  . CO2 09/25/2016 28  19 - 32 mEq/L Final  . Glucose, Bld 09/25/2016 101* 70 - 99 mg/dL Final  . BUN 09/25/2016 24* 6 - 23 mg/dL Final  . Creatinine, Ser 09/25/2016 1.13  0.40 - 1.20 mg/dL Final  . Calcium 09/25/2016 10.4  8.4 - 10.5 mg/dL Final  . GFR 09/25/2016 62.04  >60.00 mL/min Final  . TSH 09/25/2016 0.21* 0.35 - 4.50 uIU/mL Final  . Free T4 09/25/2016 1.23  0.60 - 1.60 ng/dL Final   Comment: Specimens from patients who are undergoing biotin therapy and /or ingesting biotin supplements may contain high levels of biotin.  The higher biotin concentration in these specimens interferes with this Free T4 assay.  Specimens that contain high levels  of biotin may cause false high results for this Free T4 assay.  Please interpret results in light of the total  clinical presentation of the patient.    . T3, Free 09/25/2016 3.2  2.3 - 4.2 pg/mL Final    Allergies as of 09/29/2016      Reactions   Codeine    Oxycodone Hcl    Penicillins    Chlorhexidine Gluconate Rash      Medication List       Accurate as of 09/29/16  8:18 AM. Always use your most recent med list.          benazepril-hydrochlorthiazide 20-12.5 MG tablet Commonly known as:  LOTENSIN HCT TAKE 1 TABLET BY MOUTH  DAILY   CALCIUM PO Take by mouth 2 (two) times daily.   cloNIDine 0.2 mg/24hr patch Commonly known as:  CATAPRES-TTS-2 Place 1 patch (0.2 mg total) onto the skin once a week.   diltiazem 240 MG 24 hr capsule Commonly known as:  CARDIZEM CD TAKE 1 CAPSULE BY MOUTH  DAILY   ergocalciferol 50000 units capsule Commonly known as:   VITAMIN D2 Take 50,000 Units by mouth once a week.   ferrous sulfate 325 (65 FE) MG EC tablet Take 1 tablet (325 mg total) by mouth 2 (two) times daily.   glucose blood test strip Commonly known as:  ONETOUCH VERIO USE AS DIRECTED TO CHECK  BLOOD SUGAR TWICE DAILY DX CODE- E11.65   Insulin Pen Needle 32G X 4 MM Misc Commonly known as:  BD PEN NEEDLE NANO U/F Use one per day to inject Victoza   BD PEN NEEDLE NANO U/F 32G X 4 MM Misc Generic drug:  Insulin Pen Needle USE ONE PER DAY TO INJECT  VICTOZA   metFORMIN 500 MG 24 hr tablet Commonly known as:  GLUCOPHAGE-XR TAKE 4 TABLETS BY MOUTH  DAILY WITH SUPPER.   metoprolol succinate 100 MG 24 hr tablet Commonly known as:  TOPROL-XL TAKE 1 TABLET BY MOUTH  DAILY WITH OR IMMEDIATELY  FOLLOWING A MEAL   ONETOUCH DELICA LANCETS FINE Misc Use to obtain a blood specimen 2 times per day dx code 250.02   ONETOUCH VERIO IQ SYSTEM w/Device Kit Use to check blood sugar 2 times per day dx code 250.02   rosuvastatin 10 MG tablet Commonly known as:  CRESTOR TAKE 1 TABLET BY MOUTH  DAILY   VICTOZA 18 MG/3ML Sopn Generic drug:  liraglutide INJECT SUBCUTANEOUSLY 1.8MG DAILY   warfarin 5 MG tablet Commonly known as:  COUMADIN Take as directed by  [facility]       Allergies:  Allergies  Allergen Reactions  . Codeine   . Oxycodone Hcl   . Penicillins   . Chlorhexidine Gluconate Rash    Past Medical History:  Diagnosis Date  . Atrial fibrillation ([place])   . Chronic disease anemia 03/29/2011  . Diabetes mellitus   . Dyslipidemia   . Hypertension   . Obesities, morbid ([place])   . Osteoarthritis     Past Surgical History:  Procedure Laterality Date  . CHOLECYSTECTOMY    . REPLACEMENT TOTAL KNEE BILATERAL    . SHOULDER SURGERY     RIGHT SHOULDER  . TUBAL LIGATION      Family History  Problem Relation Age of Onset  . Heart attack Mother   . Stroke Mother   . Diabetes Mother   . Hypertension Brother     Social  History:  reports that she has never smoked. She has never used smokeless tobacco. She reports that she does not drink alcohol or use drugs.  Review of Systems:  History of  atrial fibrillation: She is on Coumadin and followed by cardiologist  Hypertension:  she has required multiple medications and blood pressure is usually well controlled She is currently taking diltiazem, metoprolol, Catapres patch and benazepril HCT She has been asked to take her medications early morning and she has usually done this except today  BP Readings from Last 3 Encounters:  09/29/16 128/86  09/01/16 133/89  08/04/16 121/62     Lipids:  well controlled with Crestor and LDL is excellent   Lab Results  Component Value Date   CHOL 140 06/29/2016   HDL 62.20 06/29/2016   LDLCALC 66 06/29/2016   TRIG 57.0 06/29/2016   CHOLHDL 2 06/29/2016    History of goiter, long-standing which is euthyroid, tends to have relatively low TSH  TSH is relatively lower and free T4 is slightly higher than before Usually has normal T3 levels No symptoms of palpitations or shakiness  Lab Results  Component Value Date   T3FREE 3.2 09/25/2016      Lab Results  Component Value Date   FREET4 1.23 09/25/2016   FREET4 1.32 06/29/2016   FREET4 1.15 02/28/2016   TSH 0.21 (L) 09/25/2016   TSH 0.17 (L) 06/29/2016   TSH 0.27 (L) 02/28/2016   History of anemia Previously treated with parenteral iron Hemoglobin is down significantly despite this ER Last hemoglobin by PCP in 2016   Lab Results  Component Value Date   WBC 4.7 09/01/2016   HGB 12.2 09/01/2016   HCT 37.9 09/01/2016   MCV 86.3 09/01/2016   PLT 243 09/01/2016      Examination:   BP 128/86 (BP Location: Right Arm, Cuff Size: Large)   Pulse 80   Ht 5' 3"  (1.6 m)   Wt 230 lb 3.2 oz (104.4 kg)   SpO2 (!) 86%   BMI 40.78 kg/m   Body mass index is 40.78 kg/m.   Thyroid is enlarged about 3. 5-4 times in the right, mostly laterally, about twice  normal on the left, multinodular relatively soft No stridor Take it slightly deviated to the left  No ankle edema  ASSESSMENT/ PLAN:   Diabetes type 2 With obesity and BMI 41  Blood glucose control is fairly good with A1c 6.6  She is generally doing well with diet and trying to walk for exercise Has only occasional postprandial hyperglycemia   HYPERTENSION:  Blood pressure is still not optimally controlled, diastolic in the mid 82U Will have her increase her diltiazem to 360 mg, she will be following up with cardiologist and PCP in the next month . Low normal potassium: She was advised to increase dietary intake  GOITER:  Stable by exam, no local pressure symptoms Her TSH is persistently low although not in the hyperthyroid range, normal T3 and T4 levels     Kendra Woolford 09/29/2016, 8:18 AM   Note: This office note was prepared with Estate agent. Any transcriptional errors that result from this process are unintentional.

## 2016-10-09 ENCOUNTER — Encounter (INDEPENDENT_AMBULATORY_CARE_PROVIDER_SITE_OTHER): Payer: Self-pay

## 2016-10-09 ENCOUNTER — Ambulatory Visit (INDEPENDENT_AMBULATORY_CARE_PROVIDER_SITE_OTHER): Payer: Medicare Other | Admitting: *Deleted

## 2016-10-09 DIAGNOSIS — Z5181 Encounter for therapeutic drug level monitoring: Secondary | ICD-10-CM | POA: Diagnosis not present

## 2016-10-09 DIAGNOSIS — I4891 Unspecified atrial fibrillation: Secondary | ICD-10-CM | POA: Diagnosis not present

## 2016-10-09 LAB — POCT INR: INR: 2.4

## 2016-10-23 ENCOUNTER — Other Ambulatory Visit: Payer: Self-pay | Admitting: Endocrinology

## 2016-10-27 ENCOUNTER — Other Ambulatory Visit (HOSPITAL_BASED_OUTPATIENT_CLINIC_OR_DEPARTMENT_OTHER): Payer: Medicare Other

## 2016-10-27 ENCOUNTER — Ambulatory Visit: Payer: Medicare Other

## 2016-10-27 DIAGNOSIS — D509 Iron deficiency anemia, unspecified: Secondary | ICD-10-CM

## 2016-10-27 DIAGNOSIS — D638 Anemia in other chronic diseases classified elsewhere: Secondary | ICD-10-CM

## 2016-10-27 LAB — CBC & DIFF AND RETIC
BASO%: 0.2 % (ref 0.0–2.0)
Basophils Absolute: 0 10*3/uL (ref 0.0–0.1)
EOS%: 1.5 % (ref 0.0–7.0)
Eosinophils Absolute: 0.1 10*3/uL (ref 0.0–0.5)
HCT: 37.8 % (ref 34.8–46.6)
HEMOGLOBIN: 12.3 g/dL (ref 11.6–15.9)
Immature Retic Fract: 8.5 % (ref 1.60–10.00)
LYMPH%: 34.2 % (ref 14.0–49.7)
MCH: 27.8 pg (ref 25.1–34.0)
MCHC: 32.5 g/dL (ref 31.5–36.0)
MCV: 85.5 fL (ref 79.5–101.0)
MONO#: 0.4 10*3/uL (ref 0.1–0.9)
MONO%: 6.2 % (ref 0.0–14.0)
NEUT%: 57.9 % (ref 38.4–76.8)
NEUTROS ABS: 3.5 10*3/uL (ref 1.5–6.5)
Platelets: 228 10*3/uL (ref 145–400)
RBC: 4.42 10*6/uL (ref 3.70–5.45)
RDW: 19.1 % — ABNORMAL HIGH (ref 11.2–14.5)
Retic %: 0.85 % (ref 0.70–2.10)
Retic Ct Abs: 37.57 10*3/uL (ref 33.70–90.70)
WBC: 6 10*3/uL (ref 3.9–10.3)
lymph#: 2 10*3/uL (ref 0.9–3.3)

## 2016-10-27 NOTE — Progress Notes (Signed)
Hgb  12.3  Today 10/27/16.  Pt did not meet parameters for Aranesp injection.  Spoke with pt in the lobby, and explanations given.  Pt voiced understanding.

## 2016-11-10 ENCOUNTER — Ambulatory Visit (INDEPENDENT_AMBULATORY_CARE_PROVIDER_SITE_OTHER): Payer: Medicare Other | Admitting: *Deleted

## 2016-11-10 DIAGNOSIS — I4891 Unspecified atrial fibrillation: Secondary | ICD-10-CM

## 2016-11-10 DIAGNOSIS — Z5181 Encounter for therapeutic drug level monitoring: Secondary | ICD-10-CM | POA: Diagnosis not present

## 2016-11-10 LAB — POCT INR: INR: 3

## 2016-11-30 ENCOUNTER — Other Ambulatory Visit: Payer: Self-pay | Admitting: Cardiovascular Disease

## 2016-11-30 ENCOUNTER — Other Ambulatory Visit: Payer: Self-pay | Admitting: Endocrinology

## 2016-12-05 ENCOUNTER — Other Ambulatory Visit: Payer: Self-pay | Admitting: Endocrinology

## 2016-12-05 ENCOUNTER — Other Ambulatory Visit: Payer: Self-pay | Admitting: Internal Medicine

## 2016-12-05 DIAGNOSIS — Z1231 Encounter for screening mammogram for malignant neoplasm of breast: Secondary | ICD-10-CM

## 2016-12-07 ENCOUNTER — Ambulatory Visit: Payer: Medicare Other

## 2016-12-08 ENCOUNTER — Ambulatory Visit (INDEPENDENT_AMBULATORY_CARE_PROVIDER_SITE_OTHER): Payer: Medicare Other | Admitting: *Deleted

## 2016-12-08 DIAGNOSIS — Z5181 Encounter for therapeutic drug level monitoring: Secondary | ICD-10-CM

## 2016-12-08 DIAGNOSIS — I4891 Unspecified atrial fibrillation: Secondary | ICD-10-CM | POA: Diagnosis not present

## 2016-12-08 LAB — POCT INR: INR: 4.9

## 2016-12-18 ENCOUNTER — Encounter (INDEPENDENT_AMBULATORY_CARE_PROVIDER_SITE_OTHER): Payer: Self-pay

## 2016-12-18 ENCOUNTER — Ambulatory Visit (INDEPENDENT_AMBULATORY_CARE_PROVIDER_SITE_OTHER): Payer: Medicare Other | Admitting: *Deleted

## 2016-12-18 DIAGNOSIS — I4891 Unspecified atrial fibrillation: Secondary | ICD-10-CM

## 2016-12-18 DIAGNOSIS — Z5181 Encounter for therapeutic drug level monitoring: Secondary | ICD-10-CM | POA: Diagnosis not present

## 2016-12-18 LAB — POCT INR: INR: 3

## 2016-12-22 ENCOUNTER — Ambulatory Visit: Payer: Medicare Other

## 2016-12-22 ENCOUNTER — Other Ambulatory Visit: Payer: Medicare Other

## 2016-12-22 ENCOUNTER — Telehealth: Payer: Self-pay

## 2016-12-22 NOTE — Telephone Encounter (Signed)
Called patient today to reschedule her 10/12 appts due to dr Mosetta Putt on pal but also noticed that she was to have come in today for lab and inj which the times had passed.  I will mail her a new calendar for her October appts and have her call us to r/s todays appts  Megan Fischer

## 2016-12-25 ENCOUNTER — Other Ambulatory Visit (INDEPENDENT_AMBULATORY_CARE_PROVIDER_SITE_OTHER): Payer: Medicare Other

## 2016-12-25 DIAGNOSIS — E1122 Type 2 diabetes mellitus with diabetic chronic kidney disease: Secondary | ICD-10-CM | POA: Diagnosis not present

## 2016-12-25 DIAGNOSIS — N181 Chronic kidney disease, stage 1: Secondary | ICD-10-CM

## 2016-12-25 LAB — HEMOGLOBIN A1C: Hgb A1c MFr Bld: 6.8 % — ABNORMAL HIGH (ref 4.6–6.5)

## 2016-12-25 LAB — BASIC METABOLIC PANEL
BUN: 23 mg/dL (ref 6–23)
CHLORIDE: 106 meq/L (ref 96–112)
CO2: 26 meq/L (ref 19–32)
Calcium: 9.5 mg/dL (ref 8.4–10.5)
Creatinine, Ser: 1.02 mg/dL (ref 0.40–1.20)
GFR: 69.77 mL/min (ref >60.00)
GLUCOSE: 110 mg/dL — AB (ref 70–99)
POTASSIUM: 3.5 meq/L (ref 3.5–5.1)
Sodium: 143 mEq/L (ref 135–145)

## 2017-01-01 ENCOUNTER — Ambulatory Visit: Payer: Medicare Other | Admitting: Endocrinology

## 2017-01-02 ENCOUNTER — Ambulatory Visit (INDEPENDENT_AMBULATORY_CARE_PROVIDER_SITE_OTHER): Payer: Medicare Other | Admitting: Endocrinology

## 2017-01-02 ENCOUNTER — Encounter: Payer: Self-pay | Admitting: Endocrinology

## 2017-01-02 VITALS — BP 112/68 | HR 90 | Ht 63.0 in | Wt 221.8 lb

## 2017-01-02 DIAGNOSIS — E1122 Type 2 diabetes mellitus with diabetic chronic kidney disease: Secondary | ICD-10-CM | POA: Diagnosis not present

## 2017-01-02 DIAGNOSIS — N181 Chronic kidney disease, stage 1: Secondary | ICD-10-CM

## 2017-01-02 DIAGNOSIS — E042 Nontoxic multinodular goiter: Secondary | ICD-10-CM | POA: Diagnosis not present

## 2017-01-02 NOTE — Progress Notes (Signed)
Patient ID: Megan Fischer, female   DOB: 27-Apr-1951, 66 y.o.   MRN: [id number]   Reason for Appointment: Diabetes follow-up   History of Present Illness   Diagnosis: Type 2 DIABETES MELITUS, date of diagnosis:  2000     Previous history: She has been on various regimens of treatment in the past including Byetta and generally has done better with GLP-1 drugs She continues to take low-dose glimepiride which helps with overall control. Usually blood sugars are not variable Because of occasional hypoglycemia her Amaryl was stopped in 03/2014  Recent history: Oral hypoglycemic drugs: Metformin ER 500 mg, 4 tablets at dinner       Side effects from medications: None  She has been on Victoza since at least 2013 with good control,  taking 1.8 mg in the mornings daily  Her A1c is about the same at 6.8, previously stable at 6.6  Current blood sugar patterns and problems identified:  She did bring her monitor for download today  Home blood sugars are excellent and she checks them at various times although not enough after supper  Has only one or 2 sporadic high readings based on her diet but highest reading is only 154  A1c higher than expected for her readings  FASTING readings are fairly consistently averaging nearly 100  Continues to tolerate metformin, 2000 mg and Victoza 1.8 mg  She thinks she has cut back on her portions and eating healthier and has been able to lose significant amount of weight  Her activity level has increased with taking care of grandchildren  Monitors blood glucose: once or twice a day.    Glucometer:  One Touch   Readings by monitor download analysis   Mean values apply above for all meters except median for One Touch  PRE-MEAL Fasting Lunch Dinner Bedtime Overall  Glucose range: 96-1 27       Mean/median: 103  99  107   101   POST-MEAL PC Breakfast PC Lunch PC Dinner  Glucose range: 90-1 54   84-1 49   Mean/median: 103   97      Wt  Readings from Last 3 Encounters:  01/02/17 221 lb 12.8 oz (100.6 kg)  09/29/16 230 lb 3.2 oz (104.4 kg)  09/01/16 228 lb 12.8 oz (103.8 kg)   Lab Results  Component Value Date   HGBA1C 6.8 (H) 12/25/2016   HGBA1C 6.6 (H) 09/25/2016   HGBA1C 6.6 (H) 06/29/2016   Lab Results  Component Value Date   MICROALBUR 10.5 (H) 06/29/2016   LDLCALC 66 06/29/2016   CREATININE 1.02 12/25/2016   Other problems discussed today: See review of systems    No visits with results within 1 Week(s) from this visit.  Latest known visit with results is:  Lab on 12/25/2016  Component Date Value Ref Range Status  . Hgb A1c MFr Bld 12/25/2016 6.8* 4.6 - 6.5 % Final   Glycemic Control Guidelines for People with Diabetes:Non Diabetic:  <6%Goal of Therapy: <7%Additional Action Suggested:  >8%   . Sodium 12/25/2016 143  135 - 145 mEq/L Final  . Potassium 12/25/2016 3.5  3.5 - 5.1 mEq/L Final  . Chloride 12/25/2016 106  96 - 112 mEq/L Final  . CO2 12/25/2016 26  19 - 32 mEq/L Final  . Glucose, Bld 12/25/2016 110* 70 - 99 mg/dL Final  . BUN 12/25/2016 23  6 - 23 mg/dL Final  . Creatinine, Ser 12/25/2016 1.02  0.40 - 1.20 mg/dL Final  .  Calcium 12/25/2016 9.5  8.4 - 10.5 mg/dL Final  . GFR 12/25/2016 69.77  >60.00 mL/min Final    Allergies as of 01/02/2017      Reactions   Codeine    Oxycodone Hcl    Penicillins    Chlorhexidine Gluconate Rash      Medication List       Accurate as of 01/02/17 10:42 AM. Always use your most recent med list.          benazepril-hydrochlorthiazide 20-12.5 MG tablet Commonly known as:  LOTENSIN HCT TAKE 1 TABLET BY MOUTH  DAILY   CALCIUM PO Take by mouth 2 (two) times daily.   cloNIDine 0.2 mg/24hr patch Commonly known as:  CATAPRES-TTS-2 Place 1 patch (0.2 mg total) onto the skin once a week.   diltiazem 120 MG 24 hr capsule Commonly known as:  CARDIZEM CD Take 1 capsule (120 mg total) by mouth daily.   diltiazem 360 MG 24 hr capsule Commonly known as:   CARDIZEM CD TAKE 1 CAPSULE BY MOUTH  DAILY   ergocalciferol 50000 units capsule Commonly known as:  VITAMIN D2 Take 50,000 Units by mouth once a week.   ferrous sulfate 325 (65 FE) MG EC tablet Take 1 tablet (325 mg total) by mouth 2 (two) times daily.   glucose blood test strip Commonly known as:  ONETOUCH VERIO USE AS DIRECTED TO CHECK  BLOOD SUGAR TWICE DAILY DX CODE- E11.65   Insulin Pen Needle 32G X 4 MM Misc Commonly known as:  BD PEN NEEDLE NANO U/F Use one per day to inject Victoza   BD PEN NEEDLE NANO U/F 32G X 4 MM Misc Generic drug:  Insulin Pen Needle USE ONE PER DAY TO INJECT  VICTOZA   metFORMIN 500 MG 24 hr tablet Commonly known as:  GLUCOPHAGE-XR TAKE 4 TABLETS BY MOUTH  DAILY WITH SUPPER.   metoprolol succinate 100 MG 24 hr tablet Commonly known as:  TOPROL-XL TAKE 1 TABLET BY MOUTH  DAILY WITH OR IMMEDIATELY  FOLLOWING A MEAL   ONETOUCH DELICA LANCETS FINE Misc Use to obtain a blood specimen 2 times per day dx code 250.02   ONETOUCH VERIO IQ SYSTEM w/Device Kit Use to check blood sugar 2 times per day dx code 250.02   rosuvastatin 10 MG tablet Commonly known as:  CRESTOR TAKE 1 TABLET BY MOUTH  DAILY   VICTOZA 18 MG/3ML Sopn Generic drug:  liraglutide INJECT SUBCUTANEOUSLY 1.8MG DAILY   warfarin 5 MG tablet Commonly known as:  COUMADIN TAKE AS DIRECTED BY  COUMADIN CLINIC       Allergies:  Allergies  Allergen Reactions  . Codeine   . Oxycodone Hcl   . Penicillins   . Chlorhexidine Gluconate Rash    Past Medical History:  Diagnosis Date  . Atrial fibrillation ([place])   . Chronic disease anemia 03/29/2011  . Diabetes mellitus   . Dyslipidemia   . Hypertension   . Obesities, morbid ([place])   . Osteoarthritis     Past Surgical History:  Procedure Laterality Date  . CHOLECYSTECTOMY    . REPLACEMENT TOTAL KNEE BILATERAL    . SHOULDER SURGERY     RIGHT SHOULDER  . TUBAL LIGATION      Family History  Problem Relation Age of Onset   . Heart attack Mother   . Stroke Mother   . Diabetes Mother   . Hypertension Brother     Social History:  reports that she has never smoked. She has never  used smokeless tobacco. She reports that she does not drink alcohol or use drugs.  Review of Systems:  History of atrial fibrillation: She is on Coumadin and followed by cardiologist  Hypertension:  she has required multiple medications and blood pressure is usually well controlled She is currently taking diltiazem, metoprolol, Catapres patch and benazepril HCT No lightheadedness  No hypokalemia recently  BP Readings from Last 3 Encounters:  01/02/17 112/68  09/29/16 128/86  09/01/16 133/89     Lipids:  well controlled with Crestor and LDL is Controlled   Lab Results  Component Value Date   CHOL 140 06/29/2016   HDL 62.20 06/29/2016   LDLCALC 66 06/29/2016   TRIG 57.0 06/29/2016   CHOLHDL 2 06/29/2016    History of goiter, long-standing which is euthyroid, tends to have relatively low TSH Consistently  Usually has normal T3 and free T4 levels   Lab Results  Component Value Date   T3FREE 3.2 09/25/2016      Lab Results  Component Value Date   FREET4 1.23 09/25/2016   FREET4 1.32 06/29/2016   FREET4 1.15 02/28/2016   TSH 0.21 (L) 09/25/2016   TSH 0.17 (L) 06/29/2016   TSH 0.27 (L) 02/28/2016   History of anemia, Followed by hematologist.  Previously treated with parenteral iron   Lab Results  Component Value Date   WBC 6.0 10/27/2016   HGB 12.3 10/27/2016   HCT 37.8 10/27/2016   MCV 85.5 10/27/2016   PLT 228 10/27/2016      Examination:   BP 112/68   Pulse 90   Ht 5' 3"  (1.6 m)   Wt 221 lb 12.8 oz (100.6 kg)   SpO2 97%   BMI 39.29 kg/m   Body mass index is 39.29 kg/m.     No ankle edema  ASSESSMENT/ PLAN:   Diabetes type 2 With obesity On oral agents and Victoza  Blood glucose control is fairly good with A1c 6.8 She is now doing very well with diet and able to lose  weight Blood sugars at home are fairly consistent throughout the day Although she wants to try the freestyle Libre sensor discussed that this is not covered service by Medicare He does need to cut back on her fasting blood sugar monitoring and get blood sugar only about once a day by rotation after meals   HYPERTENSION:  Blood pressure is better controlled with increasing her diltiazem and she will continue the same regimen         Adelee Hannula 01/02/2017, 10:42 AM   Note: This office note was prepared with Dragon voice recognition system technology. Any transcriptional errors that result from this process are unintentional.

## 2017-01-02 NOTE — Patient Instructions (Signed)
Check blood sugars on waking up  2/7 days  Also check blood sugars about 2 hours after a meal and do this after different meals by rotation  Recommended blood sugar levels on waking up is 90-130 and about 2 hours after meal is 130-160  Please bring your blood sugar monitor to each visit, thank you  

## 2017-01-12 ENCOUNTER — Ambulatory Visit (INDEPENDENT_AMBULATORY_CARE_PROVIDER_SITE_OTHER): Payer: Medicare Other | Admitting: *Deleted

## 2017-01-12 DIAGNOSIS — Z5181 Encounter for therapeutic drug level monitoring: Secondary | ICD-10-CM

## 2017-01-12 DIAGNOSIS — I4891 Unspecified atrial fibrillation: Secondary | ICD-10-CM

## 2017-01-12 LAB — POCT INR: INR: 2.8

## 2017-01-18 ENCOUNTER — Other Ambulatory Visit: Payer: Self-pay | Admitting: Endocrinology

## 2017-02-05 ENCOUNTER — Ambulatory Visit (INDEPENDENT_AMBULATORY_CARE_PROVIDER_SITE_OTHER): Payer: Medicare Other | Admitting: Pharmacist Clinician (PhC)/ Clinical Pharmacy Specialist

## 2017-02-05 DIAGNOSIS — I4891 Unspecified atrial fibrillation: Secondary | ICD-10-CM

## 2017-02-05 DIAGNOSIS — Z5181 Encounter for therapeutic drug level monitoring: Secondary | ICD-10-CM

## 2017-02-05 LAB — POCT INR: INR: 1.2

## 2017-02-09 LAB — HM DIABETES EYE EXAM

## 2017-02-15 ENCOUNTER — Ambulatory Visit: Payer: Medicare Other

## 2017-02-15 ENCOUNTER — Ambulatory Visit: Payer: Medicare Other | Admitting: Hematology

## 2017-02-15 ENCOUNTER — Other Ambulatory Visit: Payer: Medicare Other

## 2017-02-16 ENCOUNTER — Ambulatory Visit (HOSPITAL_BASED_OUTPATIENT_CLINIC_OR_DEPARTMENT_OTHER): Payer: Medicare Other | Admitting: Nurse Practitioner

## 2017-02-16 ENCOUNTER — Ambulatory Visit: Payer: Medicare Other | Admitting: Hematology

## 2017-02-16 ENCOUNTER — Other Ambulatory Visit: Payer: Medicare Other

## 2017-02-16 ENCOUNTER — Other Ambulatory Visit (HOSPITAL_BASED_OUTPATIENT_CLINIC_OR_DEPARTMENT_OTHER): Payer: Medicare Other

## 2017-02-16 ENCOUNTER — Telehealth: Payer: Self-pay | Admitting: Nurse Practitioner

## 2017-02-16 ENCOUNTER — Encounter: Payer: Self-pay | Admitting: Nurse Practitioner

## 2017-02-16 ENCOUNTER — Ambulatory Visit: Payer: Medicare Other

## 2017-02-16 VITALS — BP 157/104 | HR 90 | Temp 98.7°F | Resp 17 | Ht 63.0 in | Wt 224.9 lb

## 2017-02-16 DIAGNOSIS — I10 Essential (primary) hypertension: Secondary | ICD-10-CM | POA: Diagnosis not present

## 2017-02-16 DIAGNOSIS — D638 Anemia in other chronic diseases classified elsewhere: Secondary | ICD-10-CM

## 2017-02-16 DIAGNOSIS — E119 Type 2 diabetes mellitus without complications: Secondary | ICD-10-CM | POA: Diagnosis not present

## 2017-02-16 DIAGNOSIS — D509 Iron deficiency anemia, unspecified: Secondary | ICD-10-CM | POA: Diagnosis not present

## 2017-02-16 LAB — CBC & DIFF AND RETIC
BASO%: 0.4 % (ref 0.0–2.0)
Basophils Absolute: 0 10*3/uL (ref 0.0–0.1)
EOS%: 1.5 % (ref 0.0–7.0)
Eosinophils Absolute: 0.1 10*3/uL (ref 0.0–0.5)
HCT: 36.8 % (ref 34.8–46.6)
HGB: 12 g/dL (ref 11.6–15.9)
Immature Retic Fract: 4.4 % (ref 1.60–10.00)
LYMPH#: 1.9 10*3/uL (ref 0.9–3.3)
LYMPH%: 34.8 % (ref 14.0–49.7)
MCH: 29.1 pg (ref 25.1–34.0)
MCHC: 32.6 g/dL (ref 31.5–36.0)
MCV: 89.3 fL (ref 79.5–101.0)
MONO#: 0.2 10*3/uL (ref 0.1–0.9)
MONO%: 4.4 % (ref 0.0–14.0)
NEUT#: 3.2 10*3/uL (ref 1.5–6.5)
NEUT%: 58.9 % (ref 38.4–76.8)
PLATELETS: 241 10*3/uL (ref 145–400)
RBC: 4.12 10*6/uL (ref 3.70–5.45)
RDW: 15.6 % — AB (ref 11.2–14.5)
RETIC %: 1.19 % (ref 0.70–2.10)
RETIC CT ABS: 49.03 10*3/uL (ref 33.70–90.70)
WBC: 5.4 10*3/uL (ref 3.9–10.3)

## 2017-02-16 LAB — IRON AND TIBC
%SAT: 22 % (ref 21–57)
IRON: 60 ug/dL (ref 41–142)
TIBC: 278 ug/dL (ref 236–444)
UIBC: 217 ug/dL (ref 120–384)

## 2017-02-16 LAB — COMPREHENSIVE METABOLIC PANEL
ALBUMIN: 3.9 g/dL (ref 3.5–5.0)
ALK PHOS: 64 U/L (ref 40–150)
ALT: 18 U/L (ref 0–55)
AST: 26 U/L (ref 5–34)
Anion Gap: 11 mEq/L (ref 3–11)
BILIRUBIN TOTAL: 0.58 mg/dL (ref 0.20–1.20)
BUN: 17.2 mg/dL (ref 7.0–26.0)
CALCIUM: 9.9 mg/dL (ref 8.4–10.4)
CO2: 26 mEq/L (ref 22–29)
Chloride: 106 mEq/L (ref 98–109)
Creatinine: 0.9 mg/dL (ref 0.6–1.1)
EGFR: >60 mL/min/{1.73_m2} (ref >60)
Glucose: 92 mg/dl (ref 70–140)
POTASSIUM: 3.5 meq/L (ref 3.5–5.1)
Sodium: 143 mEq/L (ref 136–145)
TOTAL PROTEIN: 7.5 g/dL (ref 6.4–8.3)

## 2017-02-16 LAB — FERRITIN: Ferritin: 123 ng/ml (ref 9–269)

## 2017-02-16 NOTE — Progress Notes (Signed)
Hemoglobin notes at 12.0 today. Injection appointment cancelled per L. Burton,NP.

## 2017-02-16 NOTE — Progress Notes (Signed)
[facility] NOTE  CC  [name], MD [address] [place] [address] Dr. Elayne Snare  DIAGNOSIS: 66 y.o. female with anemia of chronic disease.  PRIOR THERAPY:  #1 patient has received Aranesp injections every 4-8 weeks her last injection was given on 08/04/16 and prior was in January 2013.  #2 patient received IV Feraheme on 07/28/16 and 08/04/16  CURRENT THERAPY:Aranesp as needed   INTERVAL HISTORY: JAICE LAGUE 66 y.o. female returns for followup visit as scheduled. She was last seen in our office 6 months ago. She denies changes to her overall health in the interim. She has good appetite, denies bleeding, fatigue, cough, chest pain, or shortness of breath. She has stable headaches. She will get new glasses soon. She plans to get a mammogram next month. She sent in cologuard stool test this month per her PCP. Her DM appears to be well controlled with BG checks 99-137 at home. Her last feraheme infusion was 08/04/16. She is taking 1 iron pill a day.  MEDICAL HISTORY: Past Medical History:  Diagnosis Date  . Atrial fibrillation ([place])   . Chronic disease anemia 03/29/2011  . Diabetes mellitus   . Dyslipidemia   . Hypertension   . Obesities, morbid ([place])   . Osteoarthritis     ALLERGIES:  is allergic to codeine; oxycodone hcl; penicillins; and chlorhexidine gluconate.  MEDICATIONS:  Current Outpatient Prescriptions  Medication Sig Dispense Refill  . BD PEN NEEDLE NANO U/F 32G X 4 MM MISC USE ONE PER DAY TO INJECT  VICTOZA 90 each 3  . benazepril-hydrochlorthiazide (LOTENSIN HCT) 20-12.5 MG tablet TAKE 1 TABLET BY MOUTH  DAILY 90 tablet 2  . Blood Glucose Monitoring Suppl (ONETOUCH VERIO IQ SYSTEM) W/DEVICE KIT Use to check blood sugar 2 times per day dx code 250.02 1 kit 0  . CALCIUM PO Take by mouth 2 (two) times daily.      . cloNIDine (CATAPRES-TTS-2) 0.2 mg/24hr patch Place 1 patch (0.2 mg total) onto the skin once a week. 4 patch 2   . diltiazem (CARDIZEM CD) 360 MG 24 hr capsule TAKE 1 CAPSULE BY MOUTH  DAILY 90 capsule 1  . ergocalciferol (VITAMIN D2) 50000 UNITS capsule Take 50,000 Units by mouth once a week.      . ferrous sulfate 325 (65 FE) MG EC tablet Take 1 tablet (325 mg total) by mouth 2 (two) times daily. 60 tablet 3  . glucose blood (ONETOUCH VERIO) test strip USE AS DIRECTED TO CHECK  BLOOD SUGAR TWICE DAILY DX CODE- E11.65 200 each 2  . Insulin Pen Needle (BD PEN NEEDLE NANO U/F) 32G X 4 MM MISC Use one per day to inject Victoza 90 each 1  . metFORMIN (GLUCOPHAGE-XR) 500 MG 24 hr tablet TAKE 4 TABLETS BY MOUTH  DAILY WITH SUPPER. 360 tablet 3  . metoprolol succinate (TOPROL-XL) 100 MG 24 hr tablet TAKE 1 TABLET BY MOUTH  DAILY WITH OR IMMEDIATELY  FOLLOWING A MEAL 90 tablet 3  . ONETOUCH DELICA LANCETS FINE MISC Use to obtain a blood specimen 2 times per day dx code 250.02 200 each 0  . rosuvastatin (CRESTOR) 10 MG tablet TAKE 1 TABLET BY MOUTH  DAILY 90 tablet 2  . VICTOZA 18 MG/3ML SOPN INJECT SUBCUTANEOUSLY 1.8MG DAILY 27 mL 2  . warfarin (COUMADIN) 5 MG tablet TAKE AS DIRECTED BY  COUMADIN CLINIC 100 tablet 1   No current facility-administered medications for this visit.  SURGICAL HISTORY:  Past Surgical History:  Procedure Laterality Date  . CHOLECYSTECTOMY    . REPLACEMENT TOTAL KNEE BILATERAL    . SHOULDER SURGERY     RIGHT SHOULDER  . TUBAL LIGATION      REVIEW OF SYSTEMS:  Review of Systems  Constitutional: Negative for fever, malaise/fatigue and weight loss.  HENT: Negative.   Eyes: Negative.   Respiratory: Negative for shortness of breath.   Cardiovascular: Negative for chest pain and leg swelling (Left).  Gastrointestinal: Negative.  Negative for blood in stool, constipation, diarrhea, nausea and vomiting.  Genitourinary: Negative.   Musculoskeletal: Positive for joint pain (L knee, L ankle).  Skin: Negative.   Neurological: Positive for headaches. Negative for dizziness.   Endo/Heme/Allergies: Negative.  Does not bruise/bleed easily.  Psychiatric/Behavioral: Negative.   All other systems reviewed and are negative. A 10 point review of systems was conducted and is otherwise negative except for what is noted above.    PHYSICAL EXAMINATION:   BP (!) 157/104 (BP Location: Left Arm, Patient Position: Sitting)   Pulse 90   Temp 98.7 F (37.1 C) (Oral)   Resp 17   Ht 5' 3"  (1.6 m)   Wt 224 lb 14.4 oz (102 kg)   SpO2 100%   BMI 39.84 kg/m    General: Patient is a well appearing female in no acute distress HEENT: PERRLA, sclerae anicteric no conjunctival pallor, oral mucosa is pink without ulcers. Mild thyromegaly Neck: supple, no palpable adenopathy Lungs: clear to auscultation bilaterally, no wheezes, rhonchi, or rales Cardiovascular: regular rate and rhythm, S1, S2, no murmurs, rubs or gallops Abdomen: Soft, non-tender, non-distended, normoactive bowel sounds, no palpable hepatosplenomegaly or masses Extremities: warm and well perfused, no clubbing, cyanosis, or edema Skin: No rashes or lesions Neuro: No motor or sensory deficits ECOG PERFORMANCE STATUS: 1 - Symptomatic but completely ambulatory  LABORATORY DATA: CBC Latest Ref Rng & Units 02/16/2017 10/27/2016 09/01/2016  WBC 3.9 - 10.3 10e3/uL 5.4 6.0 4.7  Hemoglobin 11.6 - 15.9 g/dL 12.0 12.3 12.2  Hematocrit 34.8 - 46.6 % 36.8 37.8 37.9  Platelets 145 - 400 10e3/uL 241 228 243    CMP Latest Ref Rng & Units 02/16/2017 12/25/2016 09/25/2016  Glucose 70 - 140 mg/dl 92 110(H) 101(H)  BUN 7.0 - 26.0 mg/dL 17.2 23 24(H)  Creatinine 0.6 - 1.1 mg/dL 0.9 1.02 1.13  Sodium 136 - 145 mEq/L 143 143 143  Potassium 3.5 - 5.1 mEq/L 3.5 3.5 3.5  Chloride 96 - 112 mEq/L - 106 105  CO2 22 - 29 mEq/L 26 26 28   Calcium 8.4 - 10.4 mg/dL 9.9 9.5 10.4  Total Protein 6.4 - 8.3 g/dL 7.5 - -  Total Bilirubin 0.20 - 1.20 mg/dL 0.58 - -  Alkaline Phos 40 - 150 U/L 64 - -  AST 5 - 34 U/L 26 - -  ALT 0 - 55 U/L 18 - -    Results for THOMAS, RHUDE (MRN [id number]) as of 08/31/2016 08:53  Ref. Range 02/16/2017  Iron Latest Ref Range: 41 - 142 ug/dL 60  UIBC Latest Ref Range: 120 - 384 ug/dL 217  TIBC Latest Ref Range: 236 - 444 ug/dL 278  %SAT Latest Ref Range: 21 - 57 % 22  Ferritin Latest Ref Range: 9 - 269 ng/ml 123  Folate, Hemolysate Latest Ref Range: Not Estab. ng/mL 446.0 07/21/2016  Folate, RBC Latest Ref Range: >498 ng/mL 1,527 07/21/2016    RADIOGRAPHIC STUDIES:  No results found.  ASSESSMENT: 66  y.o. female   1. Anemia of chronic disease and iron deficiency  -she has restart Aranesp injection 165mg every 4-8 weeks when necessaryon 08/04/16. --Labs reviewed, 12.0 today, no Aranesp injection needed oday -Check labs every 2 months to see if she needs another Aranesp injection if Hb<11. -she performed cologuard stool test per Dr. MCollene Maresthis month -She is due annual mammogram in November 2018.  -Today's iron studies are normal, she takes oral iron daily, will continue   2. Diabetes, hypertension, A. fib on Coumadin, morbid RBC -She will continue follow-up with her primary care physician, endocrinologist and cardiologist. -this appears to be well-controlled  3. HTN  -currently on Lotensin HCT, clonidine, Cardizem, and metoprolol; she did not take her minutes this morning -BP 157/104 2 in clinic today -she will take her meds when she gets home today, she will call our clinic or PCP  if BP remains elevated at greater than 150/90 after BP medication  Plan -labs CBC and iron study in 2, 4, and 6 months with Aranesp if Hb<11 -Consider IV Feraheme if ferritin less than 100 or low serum iron; iron studies are normal today, no feraheme -she will call after each lab tests to see if she needs IV iron. -follow-up 6 months -Hgb 12.0 today, no Aranesp today.  All her questions were answered, she knows to call the clinic sooner if she has new or worsening needs. I spent 15 minutes counseling the  patient face to face. The total time spent in the appointment was 20 minutes.  LAlla Feeling 02/16/2017

## 2017-02-16 NOTE — Telephone Encounter (Signed)
Gave avs and calendar for December - April 2019 °

## 2017-02-19 ENCOUNTER — Ambulatory Visit (INDEPENDENT_AMBULATORY_CARE_PROVIDER_SITE_OTHER): Payer: Medicare Other | Admitting: *Deleted

## 2017-02-19 DIAGNOSIS — Z5181 Encounter for therapeutic drug level monitoring: Secondary | ICD-10-CM

## 2017-02-19 DIAGNOSIS — I4891 Unspecified atrial fibrillation: Secondary | ICD-10-CM

## 2017-02-19 LAB — POCT INR: INR: 4.6

## 2017-02-26 ENCOUNTER — Other Ambulatory Visit: Payer: Self-pay

## 2017-02-26 ENCOUNTER — Telehealth: Payer: Self-pay | Admitting: Endocrinology

## 2017-02-26 MED ORDER — BENAZEPRIL-HYDROCHLOROTHIAZIDE 20-12.5 MG PO TABS: ORAL_TABLET | ORAL | 2 refills | Status: DC

## 2017-02-26 NOTE — Telephone Encounter (Signed)
Please advise if this dosage is okay to fill? I do not see this dosage current in her chart.

## 2017-02-26 NOTE — Telephone Encounter (Signed)
Called patient and let her know that I have sent her benazepril 20-12.5 over to the [facility] for her. She did state that she is taking this medication one time daily and taking 1 and a half tablets daily.

## 2017-02-26 NOTE — Telephone Encounter (Signed)
Patient needs Benazepril/HCTZ 1 + 1/2 tablet twice a day called into pharmacy ([facility])

## 2017-02-26 NOTE — Telephone Encounter (Signed)
She may be taking 1-1/2 tablets once a day.  Please confirm.  She does not need to take this twice a day

## 2017-03-05 ENCOUNTER — Ambulatory Visit (INDEPENDENT_AMBULATORY_CARE_PROVIDER_SITE_OTHER): Payer: Medicare Other | Admitting: *Deleted

## 2017-03-05 DIAGNOSIS — Z5181 Encounter for therapeutic drug level monitoring: Secondary | ICD-10-CM | POA: Diagnosis not present

## 2017-03-05 DIAGNOSIS — I4891 Unspecified atrial fibrillation: Secondary | ICD-10-CM

## 2017-03-05 LAB — POCT INR: INR: 2.3

## 2017-03-14 ENCOUNTER — Telehealth: Payer: Self-pay | Admitting: Endocrinology

## 2017-03-14 NOTE — Telephone Encounter (Signed)
Please clarify what medication and dosage and send to the appropriate person for PA

## 2017-03-14 NOTE — Telephone Encounter (Signed)
Can you please complete this PA? Thank you!

## 2017-03-14 NOTE — Telephone Encounter (Signed)
Pt states she needs PA for the ventipril the quantity is exceeding the amount she can get

## 2017-03-16 NOTE — Telephone Encounter (Signed)
Spoke with the patient and the prescription is for benazapril- patient stated she was told by Dr. Lucianne MussKumar to take 1 1/2 daily but for that dosage she needs a PA I will call the pharmacy to get this started

## 2017-03-16 NOTE — Telephone Encounter (Signed)
Can you please advise on the note below

## 2017-03-16 NOTE — Telephone Encounter (Signed)
You can complete the prior authorization as indicated for the dose as below

## 2017-03-19 ENCOUNTER — Ambulatory Visit (INDEPENDENT_AMBULATORY_CARE_PROVIDER_SITE_OTHER): Payer: Medicare Other | Admitting: *Deleted

## 2017-03-19 DIAGNOSIS — I4891 Unspecified atrial fibrillation: Secondary | ICD-10-CM | POA: Diagnosis not present

## 2017-03-19 DIAGNOSIS — Z5181 Encounter for therapeutic drug level monitoring: Secondary | ICD-10-CM | POA: Diagnosis not present

## 2017-03-19 LAB — POCT INR: INR: 3.3

## 2017-03-19 NOTE — Telephone Encounter (Signed)
Working front office can not start this

## 2017-03-21 NOTE — Telephone Encounter (Signed)
I have not been able to start. Can you please complete this PA. Thank you!

## 2017-04-04 NOTE — Telephone Encounter (Signed)
Spoke to the patient and requested she find out what is covered to take the place of lotensin so that Dr. Lucianne MussKumar can prescribe something else

## 2017-04-09 ENCOUNTER — Ambulatory Visit (INDEPENDENT_AMBULATORY_CARE_PROVIDER_SITE_OTHER): Payer: Medicare Other | Admitting: *Deleted

## 2017-04-09 DIAGNOSIS — Z5181 Encounter for therapeutic drug level monitoring: Secondary | ICD-10-CM

## 2017-04-09 DIAGNOSIS — I4891 Unspecified atrial fibrillation: Secondary | ICD-10-CM

## 2017-04-09 LAB — POCT INR: INR: 2.2

## 2017-04-09 NOTE — Patient Instructions (Signed)
Continue taking 1 tablet everyday except 1/2 tablet on Mondays, Wednesdays and Fridays.  Recheck INR in 4 weeks. Call us with any medication changes or concerns #[phone].

## 2017-04-18 ENCOUNTER — Other Ambulatory Visit: Payer: Medicare Other

## 2017-04-18 ENCOUNTER — Ambulatory Visit: Payer: Medicare Other

## 2017-04-24 ENCOUNTER — Other Ambulatory Visit: Payer: Medicare Other

## 2017-04-27 ENCOUNTER — Ambulatory Visit: Payer: Medicare Other | Admitting: Endocrinology

## 2017-05-11 ENCOUNTER — Ambulatory Visit (INDEPENDENT_AMBULATORY_CARE_PROVIDER_SITE_OTHER): Payer: Medicare Other | Admitting: *Deleted

## 2017-05-11 DIAGNOSIS — Z5181 Encounter for therapeutic drug level monitoring: Secondary | ICD-10-CM | POA: Diagnosis not present

## 2017-05-11 DIAGNOSIS — I4891 Unspecified atrial fibrillation: Secondary | ICD-10-CM

## 2017-05-11 LAB — POCT INR: INR: 1.9

## 2017-05-11 NOTE — Patient Instructions (Signed)
Description   Today take 1 tablet then continue taking 1 tablet everyday except 1/2 tablet on Mondays, Wednesdays and Fridays.  Recheck INR in 4 weeks. Call us with any medication changes or concerns #[phone].

## 2017-05-17 ENCOUNTER — Other Ambulatory Visit: Payer: Medicare Other

## 2017-05-23 ENCOUNTER — Ambulatory Visit: Payer: Medicare Other | Admitting: Endocrinology

## 2017-05-28 ENCOUNTER — Other Ambulatory Visit: Payer: Self-pay

## 2017-05-28 MED ORDER — BENAZEPRIL HCL 20 MG PO TABS: 20.0000 mg | ORAL_TABLET | Freq: Every day | ORAL | 4 refills | Status: DC

## 2017-05-28 MED ORDER — HYDROCHLOROTHIAZIDE 25 MG PO TABS: 25.0000 mg | ORAL_TABLET | Freq: Every day | ORAL | 4 refills | Status: DC

## 2017-06-08 ENCOUNTER — Other Ambulatory Visit: Payer: Self-pay | Admitting: Endocrinology

## 2017-06-08 ENCOUNTER — Ambulatory Visit (INDEPENDENT_AMBULATORY_CARE_PROVIDER_SITE_OTHER): Payer: Medicare Other | Admitting: *Deleted

## 2017-06-08 ENCOUNTER — Other Ambulatory Visit: Payer: Self-pay | Admitting: Cardiovascular Disease

## 2017-06-08 DIAGNOSIS — I4891 Unspecified atrial fibrillation: Secondary | ICD-10-CM | POA: Diagnosis not present

## 2017-06-08 DIAGNOSIS — Z5181 Encounter for therapeutic drug level monitoring: Secondary | ICD-10-CM

## 2017-06-08 LAB — POCT INR: INR: 2.3

## 2017-06-08 NOTE — Patient Instructions (Signed)
Description   Continue taking 1 tablet everyday except 1/2 tablet on Mondays, Wednesdays and Fridays.  Recheck INR in 4 weeks. Call us with any medication changes or concerns #[phone].

## 2017-06-19 ENCOUNTER — Inpatient Hospital Stay: Payer: Medicare Other | Attending: Hematology

## 2017-06-19 ENCOUNTER — Inpatient Hospital Stay: Payer: Medicare Other

## 2017-06-19 VITALS — BP 150/88 | HR 87 | Temp 98.2°F

## 2017-06-19 DIAGNOSIS — D638 Anemia in other chronic diseases classified elsewhere: Secondary | ICD-10-CM | POA: Insufficient documentation

## 2017-06-19 DIAGNOSIS — I4891 Unspecified atrial fibrillation: Secondary | ICD-10-CM | POA: Diagnosis not present

## 2017-06-19 LAB — CBC WITH DIFFERENTIAL (CANCER CENTER ONLY)
Basophils Absolute: 0 10*3/uL (ref 0.0–0.1)
Basophils Relative: 1 %
Eosinophils Absolute: 0.1 10*3/uL (ref 0.0–0.5)
Eosinophils Relative: 2 %
HCT: 30.4 % — ABNORMAL LOW (ref 34.8–46.6)
Hemoglobin: 9.7 g/dL — ABNORMAL LOW (ref 11.6–15.9)
Lymphocytes Relative: 33 %
Lymphs Abs: 1.8 10*3/uL (ref 0.9–3.3)
MCH: 27.9 pg (ref 25.1–34.0)
MCHC: 31.9 g/dL (ref 31.5–36.0)
MCV: 87.4 fL (ref 79.5–101.0)
Monocytes Absolute: 0.4 10*3/uL (ref 0.1–0.9)
Monocytes Relative: 7 %
Neutro Abs: 3 10*3/uL (ref 1.5–6.5)
Neutrophils Relative %: 57 %
Platelet Count: 208 10*3/uL (ref 145–400)
RBC: 3.48 MIL/uL — ABNORMAL LOW (ref 3.70–5.45)
RDW: 17.4 % — ABNORMAL HIGH (ref 11.2–14.5)
WBC Count: 5.4 10*3/uL (ref 3.9–10.3)

## 2017-06-19 LAB — RETICULOCYTES
RBC.: 3.48 MIL/uL — ABNORMAL LOW (ref 3.70–5.45)
Retic Count, Absolute: 87 10*3/uL (ref 33.7–90.7)
Retic Ct Pct: 2.5 % — ABNORMAL HIGH (ref 0.7–2.1)

## 2017-06-19 MED ORDER — DARBEPOETIN ALFA 150 MCG/0.3ML IJ SOSY
150.0000 ug | PREFILLED_SYRINGE | Freq: Once | INTRAMUSCULAR | Status: AC
  Administered 2017-06-19: 150 ug via SUBCUTANEOUS
  Filled 2017-06-19: qty 0.3

## 2017-06-19 NOTE — Patient Instructions (Signed)

## 2017-06-24 ENCOUNTER — Other Ambulatory Visit: Payer: Self-pay | Admitting: Hematology

## 2017-06-24 DIAGNOSIS — D509 Iron deficiency anemia, unspecified: Secondary | ICD-10-CM

## 2017-07-06 ENCOUNTER — Ambulatory Visit (INDEPENDENT_AMBULATORY_CARE_PROVIDER_SITE_OTHER): Payer: Medicare Other | Admitting: *Deleted

## 2017-07-06 DIAGNOSIS — I4891 Unspecified atrial fibrillation: Secondary | ICD-10-CM | POA: Diagnosis not present

## 2017-07-06 DIAGNOSIS — Z5181 Encounter for therapeutic drug level monitoring: Secondary | ICD-10-CM

## 2017-07-06 LAB — POCT INR: INR: 1.9

## 2017-07-06 NOTE — Patient Instructions (Signed)
Description   Today take 1 tablet, then Continue taking 1 tablet everyday except 1/2 tablet on Mondays, Wednesdays and Fridays.  Recheck INR in 4 weeks. Call us with any medication changes or concerns #[phone].

## 2017-08-03 ENCOUNTER — Ambulatory Visit (INDEPENDENT_AMBULATORY_CARE_PROVIDER_SITE_OTHER): Payer: Medicare Other | Admitting: Pharmacist

## 2017-08-03 DIAGNOSIS — Z5181 Encounter for therapeutic drug level monitoring: Secondary | ICD-10-CM | POA: Diagnosis not present

## 2017-08-03 DIAGNOSIS — I4891 Unspecified atrial fibrillation: Secondary | ICD-10-CM | POA: Diagnosis not present

## 2017-08-03 LAB — POCT INR: INR: 2.3

## 2017-08-03 NOTE — Patient Instructions (Signed)
Description   Continue taking 1 tablet every day except 1/2 tablet on Mondays, Wednesdays and Fridays.  Recheck INR in 4 weeks. Call us with any medication changes or concerns #[phone].      

## 2017-08-17 ENCOUNTER — Other Ambulatory Visit: Payer: Medicare Other

## 2017-08-17 ENCOUNTER — Telehealth: Payer: Self-pay | Admitting: *Deleted

## 2017-08-17 ENCOUNTER — Other Ambulatory Visit: Payer: Self-pay | Admitting: Hematology

## 2017-08-17 ENCOUNTER — Ambulatory Visit: Payer: Medicare Other

## 2017-08-17 ENCOUNTER — Ambulatory Visit: Payer: Medicare Other | Admitting: Nurse Practitioner

## 2017-08-17 DIAGNOSIS — D509 Iron deficiency anemia, unspecified: Secondary | ICD-10-CM

## 2017-08-17 NOTE — Telephone Encounter (Signed)
Faxed demographics & last two office notes from Anson CroftsLace Burton NP & Dr Mosetta PuttFeng to [phone].

## 2017-08-20 ENCOUNTER — Telehealth: Payer: Self-pay | Admitting: *Deleted

## 2017-08-20 NOTE — Telephone Encounter (Signed)
Received notice from Darlena, CM that Aranesp is approved from pt's insurance.  Spoke with pt and informed her that a scheduler will contact pt with appts soon.   Pt voiced understanding.  Schedule message sent.

## 2017-08-20 NOTE — Telephone Encounter (Signed)
-----   Message from Brandt Loosenarlena Clark sent at 08/20/2017  8:43 AM EDT ----- Hello all,  [phone] Aranesp approved from 08/16/17 to 05/07/18.Auth# [id number].  Darlena  ----- Message ----- From: Brandt Loosenlark, Darlena Sent: 08/17/2017   9:26 AM To: Sabino SnipesMyrtle Hardin, RN, Thu Leisa LenzLe Bray, RN, #  Hey Dr. Mosetta PuttFeng,  Insurance denied Aranesp. If you like an expedited appeal decision will be made no later than (72 hours) after the appeal. You can call [phone]. Ref# [id number]. Member's ID# [id number].  Please keep us posted.  Darlena

## 2017-08-21 ENCOUNTER — Telehealth: Payer: Self-pay | Admitting: Hematology

## 2017-08-21 NOTE — Telephone Encounter (Signed)
Scheduled appt per 4/15 sch msg - attempted to leave voicemail for patient regarding appts, they phone kept ringing and never went to voicemail.

## 2017-08-24 ENCOUNTER — Telehealth: Payer: Self-pay

## 2017-08-24 ENCOUNTER — Ambulatory Visit: Payer: Medicare Other | Admitting: Nurse Practitioner

## 2017-08-24 ENCOUNTER — Ambulatory Visit: Payer: Medicare Other

## 2017-08-24 ENCOUNTER — Other Ambulatory Visit: Payer: Medicare Other

## 2017-08-24 NOTE — Telephone Encounter (Signed)
Spoke to pts husband who states pt was unaware of appt this morning. instructed husband to have pt call us back to reschedule. Husband verbalized understanding of this.

## 2017-08-31 ENCOUNTER — Ambulatory Visit (INDEPENDENT_AMBULATORY_CARE_PROVIDER_SITE_OTHER): Payer: Medicare Other | Admitting: *Deleted

## 2017-08-31 DIAGNOSIS — I4891 Unspecified atrial fibrillation: Secondary | ICD-10-CM

## 2017-08-31 DIAGNOSIS — Z5181 Encounter for therapeutic drug level monitoring: Secondary | ICD-10-CM

## 2017-08-31 LAB — POCT INR: INR: 2.7

## 2017-08-31 NOTE — Patient Instructions (Signed)
Description   Continue taking 1 tablet every day except 1/2 tablet on Mondays, Wednesdays and Fridays.  Recheck INR in 4 weeks. Call us with any medication changes or concerns #[phone].

## 2017-09-03 ENCOUNTER — Other Ambulatory Visit (INDEPENDENT_AMBULATORY_CARE_PROVIDER_SITE_OTHER): Payer: Medicare Other

## 2017-09-03 DIAGNOSIS — E042 Nontoxic multinodular goiter: Secondary | ICD-10-CM

## 2017-09-03 DIAGNOSIS — N181 Chronic kidney disease, stage 1: Secondary | ICD-10-CM | POA: Diagnosis not present

## 2017-09-03 DIAGNOSIS — E1122 Type 2 diabetes mellitus with diabetic chronic kidney disease: Secondary | ICD-10-CM | POA: Diagnosis not present

## 2017-09-03 LAB — BASIC METABOLIC PANEL
BUN: 29 mg/dL — ABNORMAL HIGH (ref 6–23)
CHLORIDE: 105 meq/L (ref 96–112)
CO2: 29 meq/L (ref 19–32)
CREATININE: 1.08 mg/dL (ref 0.40–1.20)
Calcium: 9.8 mg/dL (ref 8.4–10.5)
GFR: 65.18 mL/min (ref >60.00)
Glucose, Bld: 108 mg/dL — ABNORMAL HIGH (ref 70–99)
Potassium: 4.2 mEq/L (ref 3.5–5.1)
Sodium: 141 mEq/L (ref 135–145)

## 2017-09-03 LAB — T4, FREE: Free T4: 1.07 ng/dL (ref 0.60–1.60)

## 2017-09-03 LAB — TSH: TSH: 0.17 u[IU]/mL — AB (ref 0.35–4.50)

## 2017-09-03 LAB — HEMOGLOBIN A1C: Hgb A1c MFr Bld: 7.3 % — ABNORMAL HIGH (ref 4.6–6.5)

## 2017-09-03 LAB — T3, FREE: T3, Free: 3 pg/mL (ref 2.3–4.2)

## 2017-09-07 ENCOUNTER — Encounter: Payer: Self-pay | Admitting: Endocrinology

## 2017-09-07 ENCOUNTER — Ambulatory Visit (INDEPENDENT_AMBULATORY_CARE_PROVIDER_SITE_OTHER): Payer: Medicare Other | Admitting: Endocrinology

## 2017-09-07 ENCOUNTER — Telehealth: Payer: Self-pay | Admitting: Nurse Practitioner

## 2017-09-07 VITALS — BP 124/82 | HR 85 | Ht 63.0 in | Wt 221.0 lb

## 2017-09-07 DIAGNOSIS — E1165 Type 2 diabetes mellitus with hyperglycemia: Secondary | ICD-10-CM | POA: Diagnosis not present

## 2017-09-07 NOTE — Telephone Encounter (Signed)
Tried to reach patient regarding VM she left earlier.

## 2017-09-07 NOTE — Progress Notes (Signed)
Patient ID: Megan Fischer, female   DOB: 1951-03-06, 67 y.o.   MRN: [id number]   Reason for Appointment: Follow-up  History of Present Illness   Diagnosis: Type 2 DIABETES MELITUS, date of diagnosis:  2000     Previous history: She has been on various regimens of treatment in the past including Byetta and generally has done better with GLP-1 drugs She continues to take low-dose glimepiride which helps with overall control. Usually blood sugars are not variable Because of occasional hypoglycemia her Amaryl was stopped in 03/2014  Recent history: Non-insulin hypoglycemic drugs: Metformin ER 500 mg, 4 tablets at dinner, Victoza 1.8 mg daily     She has been on Victoza since at least 2013   Her A1c is higher than usual at 7.3, previously 8.8  However she has not been seen since 12/2016   Current blood sugar patterns and problems identified:  She did not bring her monitor for download today  She does not know why her blood sugars are higher  She is not clear about how often she is getting high readings over 200 but she will have sporadic high readings at different times including before or after supper, highest reading about 224 suppertime  Lab glucose was near normal  He thinks she has been compliant with her medications consistently  Also she thinks she is still fairly active with taking care of grandchildren  Does not think she is going off her diet her weight is about the same     Side effects from medications: None  Monitors blood glucose: once or twice a day.    Glucometer:  One Touch   Meter not available today    Wt Readings from Last 3 Encounters:  09/07/17 221 lb (100.2 kg)  02/16/17 224 lb 14.4 oz (102 kg)  01/02/17 221 lb 12.8 oz (100.6 kg)   Lab Results  Component Value Date   HGBA1C 7.3 (H) 09/03/2017   HGBA1C 6.8 (H) 12/25/2016   HGBA1C 6.6 (H) 09/25/2016   Lab Results  Component Value Date   MICROALBUR 10.5 (H) 06/29/2016   LDLCALC 66  06/29/2016   CREATININE 1.08 09/03/2017   Other problems discussed today: See review of systems    Lab on 09/03/2017  Component Date Value Ref Range Status  . T3, Free 09/03/2017 3.0  2.3 - 4.2 pg/mL Final  . Free T4 09/03/2017 1.07  0.60 - 1.60 ng/dL Final   Comment: Specimens from patients who are undergoing biotin therapy and /or ingesting biotin supplements may contain high levels of biotin.  The higher biotin concentration in these specimens interferes with this Free T4 assay.  Specimens that contain high levels  of biotin may cause false high results for this Free T4 assay.  Please interpret results in light of the total clinical presentation of the patient.    Marland Kitchen TSH 09/03/2017 0.17* 0.35 - 4.50 uIU/mL Final  . Sodium 09/03/2017 141  135 - 145 mEq/L Final  . Potassium 09/03/2017 4.2  3.5 - 5.1 mEq/L Final  . Chloride 09/03/2017 105  96 - 112 mEq/L Final  . CO2 09/03/2017 29  19 - 32 mEq/L Final  . Glucose, Bld 09/03/2017 108* 70 - 99 mg/dL Final  . BUN 09/03/2017 29* 6 - 23 mg/dL Final  . Creatinine, Ser 09/03/2017 1.08  0.40 - 1.20 mg/dL Final  . Calcium 09/03/2017 9.8  8.4 - 10.5 mg/dL Final  . GFR 09/03/2017 65.18  >60.00 mL/min Final  . Hgb  A1c MFr Bld 09/03/2017 7.3* 4.6 - 6.5 % Final   Glycemic Control Guidelines for People with Diabetes:Non Diabetic:  <6%Goal of Therapy: <7%Additional Action Suggested:  >8%     Allergies as of 09/07/2017      Reactions   Codeine    Oxycodone Hcl    Penicillins    Chlorhexidine Gluconate Rash      Medication List        Accurate as of 09/07/17  9:55 AM. Always use your most recent med list.          CALCIUM PO Take by mouth 2 (two) times daily.   cloNIDine 0.2 mg/24hr patch Commonly known as:  CATAPRES-TTS-2 Place 1 patch (0.2 mg total) onto the skin once a week.   diltiazem 360 MG 24 hr capsule Commonly known as:  CARDIZEM CD TAKE 1 CAPSULE BY MOUTH  DAILY   ergocalciferol 50000 units capsule Commonly known as:   VITAMIN D2 Take 50,000 Units by mouth once a week.   ferrous sulfate 325 (65 FE) MG EC tablet Take 1 tablet (325 mg total) by mouth 2 (two) times daily.   hydrochlorothiazide 25 MG tablet Commonly known as:  HYDRODIURIL Take 1 tablet (25 mg total) by mouth daily.   Insulin Pen Needle 32G X 4 MM Misc Commonly known as:  BD PEN NEEDLE NANO U/F Use one per day to inject Victoza   BD PEN NEEDLE NANO U/F 32G X 4 MM Misc Generic drug:  Insulin Pen Needle USE ONE PER DAY TO INJECT  VICTOZA   metFORMIN 500 MG 24 hr tablet Commonly known as:  GLUCOPHAGE-XR TAKE 4 TABLETS BY MOUTH  DAILY WITH SUPPER.   metoprolol succinate 100 MG 24 hr tablet Commonly known as:  TOPROL-XL TAKE 1 TABLET BY MOUTH  DAILY WITH OR IMMEDIATELY  FOLLOWING A MEAL   ONETOUCH DELICA LANCETS FINE Misc Use to obtain a blood specimen 2 times per day dx code 250.02   ONETOUCH VERIO IQ SYSTEM w/Device Kit Use to check blood sugar 2 times per day dx code 250.02   ONETOUCH VERIO test strip Generic drug:  glucose blood USE AS DIRECTED TO CHECK  BLOOD SUGAR TWICE DAILY   rosuvastatin 10 MG tablet Commonly known as:  CRESTOR TAKE 1 TABLET BY MOUTH  DAILY   VICTOZA 18 MG/3ML Sopn Generic drug:  liraglutide INJECT SUBCUTANEOUSLY 1.8MG DAILY   warfarin 5 MG tablet Commonly known as:  COUMADIN Take as directed by the anticoagulation clinic. If you are unsure how to take this medication, talk to your nurse or doctor. Original instructions:  TAKE AS DIRECTED BY  COUMADIN CLINIC       Allergies:  Allergies  Allergen Reactions  . Codeine   . Oxycodone Hcl   . Penicillins   . Chlorhexidine Gluconate Rash    Past Medical History:  Diagnosis Date  . Atrial fibrillation ([place])   . Chronic disease anemia 03/29/2011  . Diabetes mellitus   . Dyslipidemia   . Hypertension   . Obesities, morbid ([place])   . Osteoarthritis     Past Surgical History:  Procedure Laterality Date  . CHOLECYSTECTOMY    .  REPLACEMENT TOTAL KNEE BILATERAL    . SHOULDER SURGERY     RIGHT SHOULDER  . TUBAL LIGATION      Family History  Problem Relation Age of Onset  . Heart attack Mother   . Stroke Mother   . Diabetes Mother   . Hypertension Brother  Social History:  reports that she has never smoked. She has never used smokeless tobacco. She reports that she does not drink alcohol or use drugs.  Review of Systems:  History of atrial fibrillation: She is on Coumadin and followed by cardiologist  Hypertension:  she has required multiple medications and blood pressure is usually well controlled She is currently taking diltiazem, metoprolol, Catapres patch and HCT She had difficulty with insurance coverage for her Lotensin HCT and is reportedly not taking the separate Lotensin that was called in  Is compliant with her medications  BP Readings from Last 3 Encounters:  09/07/17 124/82  06/19/17 (!) 150/88  02/16/17 (!) 157/104     Lipids:  well controlled with Crestor and LDL is 74 in 7/18   Lab Results  Component Value Date   CHOL 140 06/29/2016   HDL 62.20 06/29/2016   LDLCALC 66 06/29/2016   TRIG 57.0 06/29/2016   CHOLHDL 2 06/29/2016    History of goiter, long-standing which is euthyroid, tends to have relatively low TSH This is about the same She has normal T3 and free T4 levels fairly consistently   Lab Results  Component Value Date   T3FREE 3.0 09/03/2017   T3FREE 3.2 09/25/2016      Lab Results  Component Value Date   FREET4 1.07 09/03/2017   FREET4 1.23 09/25/2016   FREET4 1.32 06/29/2016   TSH 0.17 (L) 09/03/2017   TSH 0.21 (L) 09/25/2016   TSH 0.17 (L) 06/29/2016   History of anemia, Followed by hematologist.  Previously treated with parenteral iron   Lab Results  Component Value Date   WBC 5.4 06/19/2017   HGB 9.7 (L) 06/19/2017   HCT 30.4 (L) 06/19/2017   MCV 87.4 06/19/2017   PLT 208 06/19/2017      Examination:   BP 124/82 (Cuff Size: Large)    Pulse 85   Ht _0  (1.6 m)   Wt 221 lb (100.2 kg)   SpO2 98%   BMI 39.15 kg/m   Body mass index is 39.15 kg/m.      ASSESSMENT/ PLAN:   Diabetes type 2 With obesity on metformin and Victoza maximum doses  Although her A1c is 7.4 she did not bring her meter and not clear how often she is having high readings Her fasting glucose was 108  Since her blood sugars appear to be not consistently high when not start her on another medication like Amaryl However will need to have her check her blood sugar more consistently including after meals and bring her monitor on the next visit  Would consider adding Invokana for multiple benefits from the next visit blood sugars are still not controlled   HYPERTENSION:  Blood pressure is appearing controlled despite not taking benazepril and she will continue current regimen   There are no Patient Instructions on file for this visit.    Elayne Snare 09/07/2017, 9:55 AM   Note: This office note was prepared with Dragon voice recognition system technology. Any transcriptional errors that result from this process are unintentional.

## 2017-09-11 ENCOUNTER — Telehealth: Payer: Self-pay | Admitting: Nurse Practitioner

## 2017-09-11 NOTE — Telephone Encounter (Signed)
Patient called to reschedule she said insurance would cover

## 2017-09-21 ENCOUNTER — Telehealth: Payer: Self-pay | Admitting: Hematology

## 2017-09-21 ENCOUNTER — Inpatient Hospital Stay: Payer: Medicare Other

## 2017-09-21 ENCOUNTER — Telehealth: Payer: Self-pay | Admitting: Emergency Medicine

## 2017-09-21 ENCOUNTER — Encounter: Payer: Self-pay | Admitting: Nurse Practitioner

## 2017-09-21 ENCOUNTER — Inpatient Hospital Stay: Payer: Medicare Other | Attending: Hematology

## 2017-09-21 ENCOUNTER — Inpatient Hospital Stay (HOSPITAL_BASED_OUTPATIENT_CLINIC_OR_DEPARTMENT_OTHER): Payer: Medicare Other | Admitting: Nurse Practitioner

## 2017-09-21 VITALS — BP 147/76 | HR 73 | Temp 98.0°F | Resp 18 | Ht 63.0 in | Wt 226.5 lb

## 2017-09-21 DIAGNOSIS — E119 Type 2 diabetes mellitus without complications: Secondary | ICD-10-CM

## 2017-09-21 DIAGNOSIS — D638 Anemia in other chronic diseases classified elsewhere: Secondary | ICD-10-CM | POA: Insufficient documentation

## 2017-09-21 DIAGNOSIS — R5383 Other fatigue: Secondary | ICD-10-CM

## 2017-09-21 DIAGNOSIS — D509 Iron deficiency anemia, unspecified: Secondary | ICD-10-CM | POA: Insufficient documentation

## 2017-09-21 DIAGNOSIS — Z7984 Long term (current) use of oral hypoglycemic drugs: Secondary | ICD-10-CM | POA: Diagnosis not present

## 2017-09-21 DIAGNOSIS — M1990 Unspecified osteoarthritis, unspecified site: Secondary | ICD-10-CM | POA: Diagnosis not present

## 2017-09-21 DIAGNOSIS — K5900 Constipation, unspecified: Secondary | ICD-10-CM | POA: Insufficient documentation

## 2017-09-21 DIAGNOSIS — E785 Hyperlipidemia, unspecified: Secondary | ICD-10-CM

## 2017-09-21 DIAGNOSIS — I4891 Unspecified atrial fibrillation: Secondary | ICD-10-CM

## 2017-09-21 DIAGNOSIS — I10 Essential (primary) hypertension: Secondary | ICD-10-CM | POA: Insufficient documentation

## 2017-09-21 DIAGNOSIS — Z79899 Other long term (current) drug therapy: Secondary | ICD-10-CM | POA: Diagnosis not present

## 2017-09-21 DIAGNOSIS — Z7901 Long term (current) use of anticoagulants: Secondary | ICD-10-CM | POA: Insufficient documentation

## 2017-09-21 LAB — CBC WITH DIFFERENTIAL (CANCER CENTER ONLY)
BASOS ABS: 0 10*3/uL (ref 0.0–0.1)
Basophils Relative: 0 %
Eosinophils Absolute: 0.1 10*3/uL (ref 0.0–0.5)
Eosinophils Relative: 2 %
HEMATOCRIT: 32.5 % — AB (ref 34.8–46.6)
HEMOGLOBIN: 10.4 g/dL — AB (ref 11.6–15.9)
LYMPHS PCT: 29 %
Lymphs Abs: 1.5 10*3/uL (ref 0.9–3.3)
MCH: 26.8 pg (ref 25.1–34.0)
MCHC: 32 g/dL (ref 31.5–36.0)
MCV: 83.8 fL (ref 79.5–101.0)
MONO ABS: 0.4 10*3/uL (ref 0.1–0.9)
Monocytes Relative: 8 %
NEUTROS ABS: 3.2 10*3/uL (ref 1.5–6.5)
NEUTROS PCT: 61 %
Platelet Count: 217 10*3/uL (ref 145–400)
RBC: 3.88 MIL/uL (ref 3.70–5.45)
RDW: 18.7 % — AB (ref 11.2–14.5)
WBC Count: 5.2 10*3/uL (ref 3.9–10.3)

## 2017-09-21 LAB — FERRITIN: FERRITIN: 89 ng/mL (ref 9–269)

## 2017-09-21 LAB — COMPREHENSIVE METABOLIC PANEL
ALT: 20 U/L (ref 0–55)
AST: 23 U/L (ref 5–34)
Albumin: 3.5 g/dL (ref 3.5–5.0)
Alkaline Phosphatase: 91 U/L (ref 40–150)
Anion gap: 8 (ref 3–11)
BUN: 26 mg/dL (ref 7–26)
CALCIUM: 9.7 mg/dL (ref 8.4–10.4)
CHLORIDE: 107 mmol/L (ref 98–109)
CO2: 26 mmol/L (ref 22–29)
CREATININE: 0.99 mg/dL (ref 0.60–1.10)
GFR calc non Af Amer: 58 mL/min — ABNORMAL LOW (ref >60)
Glucose, Bld: 105 mg/dL (ref 70–140)
Potassium: 4.2 mmol/L (ref 3.5–5.1)
Sodium: 141 mmol/L (ref 136–145)
Total Bilirubin: 0.5 mg/dL (ref 0.2–1.2)
Total Protein: 7.4 g/dL (ref 6.4–8.3)

## 2017-09-21 LAB — IRON AND TIBC
IRON: 48 ug/dL (ref 41–142)
Saturation Ratios: 15 % — ABNORMAL LOW (ref 21–57)
TIBC: 314 ug/dL (ref 236–444)
UIBC: 266 ug/dL

## 2017-09-21 LAB — RETICULOCYTES
RBC.: 3.88 MIL/uL (ref 3.70–5.45)
RETIC COUNT ABSOLUTE: 62.1 10*3/uL (ref 33.7–90.7)
Retic Ct Pct: 1.6 % (ref 0.7–2.1)

## 2017-09-21 MED ORDER — DARBEPOETIN ALFA 200 MCG/0.4ML IJ SOSY
150.0000 ug | PREFILLED_SYRINGE | Freq: Once | INTRAMUSCULAR | Status: AC
  Administered 2017-09-21: 150 ug via SUBCUTANEOUS

## 2017-09-21 MED ORDER — DARBEPOETIN ALFA 200 MCG/0.4ML IJ SOSY: 150.0000 ug | PREFILLED_SYRINGE | Freq: Once | INTRAMUSCULAR | Status: DC

## 2017-09-21 NOTE — Telephone Encounter (Addendum)
Pt verbalized understanding of this note  ----- Message from Pollyann Samples, NP sent at 09/21/2017 11:11 AM EDT ----- Please call her and let her know iron studies are trending down. Will arrange IV Feraheme, 2 treatments 1 week apart. This will hopefully help bring her Hgb up and she will not require aranesp as much.  i'll send schedule message.   Thanks, Clayborn Heron

## 2017-09-21 NOTE — Progress Notes (Signed)
[facility]  [phone] [phone]  Clinic Follow up Note   Patient Care Team: [name], MD as PCP - General (Internal Medicine) 09/21/2017  DIAGNOSIS: 67 y.o. female with anemia of chronic disease.  PRIOR THERAPY:  #1 patient has received Aranesp injections every 4-8 weeks; last injection was given on 06/19/17 for Hgb 9.7; prior to that was 08/04/16 and 05/2011.  #2 patient received IV Feraheme on 07/28/16 and 08/04/16; takes oral iron 1 tab BID  CURRENT THERAPY:Aranesp PRN Hgb <11   INTERVAL HISTORY: Megan Fischer returns for follow-up as scheduled for her anemia.  She was last seen 02/24/2017.  She denies changes in her health or new medical diagnoses in the interim.  Continues to follow-up with PCP, cardiology, and endocrinology.  She has mild stable occasional fatigue, no fever or chills.  Good appetite.  Occasional constipation she attributes to oral iron.  Last BM 2 days ago, can induce BM with diet changes.  Denies blood in her stool.  No cough, chest pain, dyspnea or pain.  Left leg is more swollen than right at baseline, no calf pain.  REVIEW OF SYSTEMS:   Constitutional: Denies fevers, chills or abnormal weight loss (+) mild fatigue at baseline Respiratory: Denies cough, dyspnea or wheezes Cardiovascular: Denies palpitation, chest discomfort (+) L>R lower extremity swelling at baseline  Gastrointestinal:  Denies nausea, vomiting, diarrhea, heartburn, hematochezia, or change in bowel habits (+) periodic constipation, relieved with diet changes  Lymphatics: Denies new lymphadenopathy or easy bruising Neurological:Denies numbness, tingling or new weaknesses Behavioral/Psych: Mood is stable, no new changes  MSK: Denies pain. (+) ambulates with cane  All other systems were reviewed with the patient and are negative.  MEDICAL HISTORY:  Past Medical History:  Diagnosis Date  . Atrial fibrillation ([place])   . Chronic disease anemia  03/29/2011  . Diabetes mellitus   . Dyslipidemia   . Hypertension   . Obesities, morbid ([place])   . Osteoarthritis     SURGICAL HISTORY: Past Surgical History:  Procedure Laterality Date  . CHOLECYSTECTOMY    . REPLACEMENT TOTAL KNEE BILATERAL    . SHOULDER SURGERY     RIGHT SHOULDER  . TUBAL LIGATION      I have reviewed the social history and family history with the patient and they are unchanged from previous note.  ALLERGIES:  is allergic to codeine; oxycodone hcl; penicillins; and chlorhexidine gluconate.  MEDICATIONS:  Current Outpatient Medications  Medication Sig Dispense Refill  . BD PEN NEEDLE NANO U/F 32G X 4 MM MISC USE ONE PER DAY TO INJECT  VICTOZA 90 each 3  . Blood Glucose Monitoring Suppl (ONETOUCH VERIO IQ SYSTEM) W/DEVICE KIT Use to check blood sugar 2 times per day dx code 250.02 1 kit 0  . CALCIUM PO Take by mouth 2 (two) times daily.      . cloNIDine (CATAPRES-TTS-2) 0.2 mg/24hr patch Place 1 patch (0.2 mg total) onto the skin once a week. 4 patch 2  . diltiazem (CARDIZEM CD) 360 MG 24 hr capsule TAKE 1 CAPSULE BY MOUTH  DAILY 90 capsule 1  . ergocalciferol (VITAMIN D2) 50000 UNITS capsule Take 50,000 Units by mouth once a week.      . ferrous sulfate 325 (65 FE) MG EC tablet Take 1 tablet (325 mg total) by mouth 2 (two) times daily. 60 tablet 3  . hydrochlorothiazide (HYDRODIURIL) 25 MG tablet Take 1 tablet (25 mg total) by mouth daily. 30 tablet 4  . Insulin  Pen Needle (BD PEN NEEDLE NANO U/F) 32G X 4 MM MISC Use one per day to inject Victoza 90 each 1  . metFORMIN (GLUCOPHAGE-XR) 500 MG 24 hr tablet TAKE 4 TABLETS BY MOUTH  DAILY WITH SUPPER. 360 tablet 3  . metoprolol succinate (TOPROL-XL) 100 MG 24 hr tablet TAKE 1 TABLET BY MOUTH  DAILY WITH OR IMMEDIATELY  FOLLOWING A MEAL 90 tablet 3  . ONETOUCH DELICA LANCETS FINE MISC Use to obtain a blood specimen 2 times per day dx code 250.02 200 each 0  . ONETOUCH VERIO test strip USE AS DIRECTED TO CHECK  BLOOD  SUGAR TWICE DAILY 200 each 2  . rosuvastatin (CRESTOR) 10 MG tablet TAKE 1 TABLET BY MOUTH  DAILY 90 tablet 2  . VICTOZA 18 MG/3ML SOPN INJECT SUBCUTANEOUSLY 1.8MG DAILY 27 mL 2  . warfarin (COUMADIN) 5 MG tablet TAKE AS DIRECTED BY  COUMADIN CLINIC 100 tablet 1   No current facility-administered medications for this visit.     PHYSICAL EXAMINATION: ECOG PERFORMANCE STATUS: 1 - Symptomatic but completely ambulatory  Vitals:   09/21/17 0826  BP: (!) 147/76  Pulse: 73  Resp: 18  Temp: 98 F (36.7 C)  SpO2: 100%   Filed Weights   09/21/17 0826  Weight: 226 lb 8 oz (102.7 kg)    GENERAL:alert, no distress and comfortable SKIN: no rashes or significant lesions EYES: normal, Conjunctiva are pink and non-injected, sclera clear LYMPH:  no palpable lymphadenopathy in the cervical, axillary or inguinal LUNGS: clear to auscultation with normal breathing effort HEART: regular rate & rhythm and no murmurs (+) bilateral lower extremity edema, L>R  ABDOMEN:abdomen soft, non-tender and normal bowel sounds Musculoskeletal:no cyanosis of digits and no clubbing  NEURO: alert & oriented x 3 with fluent speech, no focal motor/sensory deficits  LABORATORY DATA:  I have reviewed the data as listed CBC Latest Ref Rng & Units 09/21/2017 06/19/2017 02/16/2017  WBC 3.9 - 10.3 K/uL 5.2 5.4 5.4  Hemoglobin 11.6 - 15.9 g/dL 10.4(L) 9.7(L) 12.0  Hematocrit 34.8 - 46.6 % 32.5(L) 30.4(L) 36.8  Platelets 145 - 400 K/uL 217 208 241     CMP Latest Ref Rng & Units 09/21/2017 09/03/2017 02/16/2017  Glucose 70 - 140 mg/dL 105 108(H) 92  BUN 7 - 26 mg/dL 26 29(H) 17.2  Creatinine 0.60 - 1.10 mg/dL 0.99 1.08 0.9  Sodium 136 - 145 mmol/L 141 141 143  Potassium 3.5 - 5.1 mmol/L 4.2 4.2 3.5  Chloride 98 - 109 mmol/L 107 105 -  CO2 22 - 29 mmol/L 26 29 26   Calcium 8.4 - 10.4 mg/dL 9.7 9.8 9.9  Total Protein 6.4 - 8.3 g/dL 7.4 - 7.5  Total Bilirubin 0.2 - 1.2 mg/dL 0.5 - 0.58  Alkaline Phos 40 - 150 U/L 91 -  64  AST 5 - 34 U/L 23 - 26  ALT 0 - 55 U/L 20 - 18   Results for Megan Fischer, Megan Fischer (MRN [id number]) as of 08/31/2016 08:53  Ref. Range 02/16/2017 09/21/2017  Iron Latest Ref Range: 41 - 142 ug/dL 60 48  UIBC Latest Ref Range: 120 - 384 ug/dL 217 266  TIBC Latest Ref Range: 236 - 444 ug/dL 278 314  %SAT Latest Ref Range: 21 - 57 % 22 15  Ferritin Latest Ref Range: 9 - 269 ng/ml 123 89  Folate, Hemolysate Latest Ref Range: Not Estab. ng/mL 446.0 07/21/2016   Folate, RBC Latest Ref Range: >498 ng/mL 1527 07/21/2016  RADIOGRAPHIC STUDIES: I have personally reviewed the radiological images as listed and agreed with the findings in the report. No results found.   ASSESSMENT & PLAN: 67 y.o. female   1. Anemia of chronic disease and iron deficiency  -Ms. Mcdaniel appears stable. She last received aranesp 150 mcg on 06/19/17 for Hgb 9.7, prior to that on 08/04/16 -While her anemia is due in large part to chronic disease, there is a component or iron deficiency as well. She received IV Feraheme 07/2016 and maintained normal Hgb until approximately 1 year later when Hgb dropped.  -Hgb 10.4 today, improved from 06/2017 with 1 dose aranesp  -Iron studies are trending down; serum iron 48, 15% sat; ferritin 89; given her iron studies and anemia will set up IV Feraheme weekly x2 -On ferrous sulfate 1 tab BID, tolerating well with mild controlled constipation.  -Will check labs more frequently for now, lab monthly x2, then q2 x2; f/u with Dr. Burr Medico in 4-6 months   2. Diabetes, HTN, A. fib on Coumadin, morbid obesity -Bp fluctuates, slightly elevated today 147/76; she continues medications as prescribed  -A1c noted to be 7.3 in 08/2017, which is slightly increased from her normal; BG 105 today -Followed by PCP, cardiology, endocrinology   3. General health maintenance  -She had cologuard testing per Dr. Collene Mares ~6 months ago, was negative per patient.  -Overdue for mammogram, she does not want me to order  it today, she will get PCP to help her arrange -Plans to f/u with PCP this summer in June or July.  -Continues routine f/u with PCP, cardiology, endocrinology   PLAN -Aranesp today for Hgb 10.4; continue PRN for Hgb <11 -sent scheduling message to arrange IV Feraheme weekly x2 -Labs monthly x2, then q2 x2 with injection if needed -F/u with Dr. Burr Medico in 4-6 months  -continue f/u with PCP, cardiology, endocrine -Overdue for mammogram   All questions were answered. The patient knows to call the clinic with any problems, questions or concerns. No barriers to learning was detected.     Megan Feeling, NP 09/21/17

## 2017-09-21 NOTE — Telephone Encounter (Signed)
Appointments scheduled avs/calendar printed per 5/17 los °

## 2017-09-24 ENCOUNTER — Telehealth: Payer: Self-pay | Admitting: Hematology

## 2017-09-24 LAB — FOLATE RBC
Folate, Hemolysate: 571.5 ng/mL
Folate, RBC: 1727 ng/mL (ref >498)
Hematocrit: 33.1 % — ABNORMAL LOW (ref 34.0–46.6)

## 2017-09-24 NOTE — Telephone Encounter (Signed)
Scheduled appt per 5/17 sch msg - spoke w/ pt and confirmed appt.

## 2017-09-26 ENCOUNTER — Other Ambulatory Visit: Payer: Self-pay | Admitting: Hematology

## 2017-09-28 ENCOUNTER — Inpatient Hospital Stay: Payer: Medicare Other

## 2017-09-28 ENCOUNTER — Ambulatory Visit (INDEPENDENT_AMBULATORY_CARE_PROVIDER_SITE_OTHER): Payer: Medicare Other | Admitting: *Deleted

## 2017-09-28 VITALS — BP 125/60 | HR 67 | Temp 98.0°F | Resp 16

## 2017-09-28 DIAGNOSIS — D638 Anemia in other chronic diseases classified elsewhere: Secondary | ICD-10-CM | POA: Diagnosis not present

## 2017-09-28 DIAGNOSIS — I4891 Unspecified atrial fibrillation: Secondary | ICD-10-CM

## 2017-09-28 DIAGNOSIS — Z5181 Encounter for therapeutic drug level monitoring: Secondary | ICD-10-CM | POA: Diagnosis not present

## 2017-09-28 LAB — POCT INR: INR: 2.4 (ref 2.0–3.0)

## 2017-09-28 MED ORDER — SODIUM CHLORIDE 0.9 % IV SOLN
Freq: Once | INTRAVENOUS | Status: AC
  Administered 2017-09-28: 14:00:00 via INTRAVENOUS

## 2017-09-28 MED ORDER — SODIUM CHLORIDE 0.9 % IV SOLN
510.0000 mg | Freq: Once | INTRAVENOUS | Status: AC
  Administered 2017-09-28: 510 mg via INTRAVENOUS
  Filled 2017-09-28: qty 17

## 2017-09-28 NOTE — Patient Instructions (Signed)
Description   Continue taking 1 tablet every day except 1/2 tablet on Mondays, Wednesdays and Fridays.  Recheck INR in 6 weeks. Call us with any medication changes or concerns #[phone].     

## 2017-09-28 NOTE — Patient Instructions (Signed)

## 2017-10-05 ENCOUNTER — Inpatient Hospital Stay: Payer: Medicare Other

## 2017-10-05 ENCOUNTER — Telehealth: Payer: Self-pay | Admitting: Hematology

## 2017-10-05 NOTE — Telephone Encounter (Signed)
Patient stopped by scheduling to pick up copy of schedule

## 2017-10-05 NOTE — Progress Notes (Signed)
Unsuccessful IV start attempt x 4 by infusion RN's. IV team no answer.  Pt instructed to go to scheduling and re schedule apt for next week to receive IV feraheme and to hydrate well the night before.

## 2017-10-12 ENCOUNTER — Inpatient Hospital Stay: Payer: Medicare Other | Attending: Hematology

## 2017-10-12 VITALS — BP 138/78 | HR 65 | Temp 98.3°F | Resp 18

## 2017-10-12 DIAGNOSIS — Z7901 Long term (current) use of anticoagulants: Secondary | ICD-10-CM | POA: Insufficient documentation

## 2017-10-12 DIAGNOSIS — D509 Iron deficiency anemia, unspecified: Secondary | ICD-10-CM | POA: Insufficient documentation

## 2017-10-12 DIAGNOSIS — I4891 Unspecified atrial fibrillation: Secondary | ICD-10-CM | POA: Insufficient documentation

## 2017-10-12 DIAGNOSIS — D638 Anemia in other chronic diseases classified elsewhere: Secondary | ICD-10-CM | POA: Diagnosis not present

## 2017-10-12 MED ORDER — SODIUM CHLORIDE 0.9 % IV SOLN
INTRAVENOUS | Status: DC
  Administered 2017-10-12: 10:00:00 via INTRAVENOUS

## 2017-10-12 MED ORDER — SODIUM CHLORIDE 0.9 % IV SOLN
510.0000 mg | Freq: Once | INTRAVENOUS | Status: AC
  Administered 2017-10-12: 510 mg via INTRAVENOUS
  Filled 2017-10-12: qty 17

## 2017-10-12 NOTE — Progress Notes (Signed)
IV consulted for IV start. 2 attempts were made.

## 2017-10-12 NOTE — Patient Instructions (Signed)

## 2017-10-19 ENCOUNTER — Inpatient Hospital Stay: Payer: Medicare Other

## 2017-11-09 ENCOUNTER — Ambulatory Visit (INDEPENDENT_AMBULATORY_CARE_PROVIDER_SITE_OTHER): Payer: Medicare Other | Admitting: Pharmacist

## 2017-11-09 DIAGNOSIS — Z5181 Encounter for therapeutic drug level monitoring: Secondary | ICD-10-CM

## 2017-11-09 DIAGNOSIS — I4891 Unspecified atrial fibrillation: Secondary | ICD-10-CM | POA: Diagnosis not present

## 2017-11-09 LAB — POCT INR: INR: 2.4 (ref 2.0–3.0)

## 2017-11-09 NOTE — Patient Instructions (Signed)
Description   Continue taking 1 tablet every day except 1/2 tablet on Mondays, Wednesdays and Fridays.  Recheck INR in 6 weeks. Call us with any medication changes or concerns #[phone].     

## 2017-11-16 ENCOUNTER — Inpatient Hospital Stay: Payer: Medicare Other

## 2017-11-16 ENCOUNTER — Inpatient Hospital Stay: Payer: Medicare Other | Attending: Hematology

## 2017-11-16 DIAGNOSIS — D638 Anemia in other chronic diseases classified elsewhere: Secondary | ICD-10-CM | POA: Diagnosis not present

## 2017-11-16 DIAGNOSIS — D509 Iron deficiency anemia, unspecified: Secondary | ICD-10-CM

## 2017-11-16 LAB — IRON AND TIBC
Iron: 77 ug/dL (ref 41–142)
Saturation Ratios: 29 % (ref 21–57)
TIBC: 264 ug/dL (ref 236–444)
UIBC: 187 ug/dL

## 2017-11-16 LAB — CBC WITH DIFFERENTIAL (CANCER CENTER ONLY)
Basophils Absolute: 0 10*3/uL (ref 0.0–0.1)
Basophils Relative: 1 %
EOS ABS: 0.1 10*3/uL (ref 0.0–0.5)
EOS PCT: 3 %
HCT: 38.9 % (ref 34.8–46.6)
Hemoglobin: 12.8 g/dL (ref 11.6–15.9)
LYMPHS ABS: 1.6 10*3/uL (ref 0.9–3.3)
Lymphocytes Relative: 32 %
MCH: 28.1 pg (ref 25.1–34.0)
MCHC: 32.9 g/dL (ref 31.5–36.0)
MCV: 85.5 fL (ref 79.5–101.0)
MONOS PCT: 6 %
Monocytes Absolute: 0.3 10*3/uL (ref 0.1–0.9)
Neutro Abs: 3 10*3/uL (ref 1.5–6.5)
Neutrophils Relative %: 58 %
PLATELETS: 201 10*3/uL (ref 145–400)
RBC: 4.56 MIL/uL (ref 3.70–5.45)
RDW: 20.6 % — ABNORMAL HIGH (ref 11.2–14.5)
WBC: 5.1 10*3/uL (ref 3.9–10.3)

## 2017-11-16 LAB — FERRITIN: FERRITIN: 968 ng/mL — AB (ref 11–307)

## 2017-11-16 MED ORDER — DARBEPOETIN ALFA 300 MCG/0.6ML IJ SOSY
PREFILLED_SYRINGE | INTRAMUSCULAR | Status: AC
  Filled 2017-11-16: qty 0.6

## 2017-11-16 NOTE — Progress Notes (Signed)
Pt HGB is 12.8 today no injection needed gave copy of labs to pt and told her to call if she has any questions or complications

## 2017-11-19 LAB — FOLATE RBC
FOLATE, HEMOLYSATE: 527.5 ng/mL
Folate, RBC: 1370 ng/mL (ref >498)
Hematocrit: 38.5 % (ref 34.0–46.6)

## 2017-11-20 ENCOUNTER — Telehealth: Payer: Self-pay

## 2017-11-20 NOTE — Telephone Encounter (Signed)
Per Santiago GladLacie Burton NP notified patient that her iron level has improved to normal range, continue take oral iron once daily and keep her upcoming lab appointment.  Patient verbalized an understanding.

## 2017-11-20 NOTE — Telephone Encounter (Signed)
-----   Message from Pollyann SamplesLacie K Burton, NP sent at 11/16/2017  5:25 PM EDT ----- Please let her know iron improved to normal range. Continue oral iron once daily and keep lab apt next month. Thanks, Clayborn HeronLacie NP

## 2017-11-23 ENCOUNTER — Other Ambulatory Visit: Payer: Self-pay | Admitting: Endocrinology

## 2017-11-30 ENCOUNTER — Telehealth: Payer: Self-pay

## 2017-11-30 NOTE — Telephone Encounter (Signed)
-----   Message from Malachy MoodYan Feng, MD sent at 11/30/2017  8:47 AM EDT ----- Please let pt know her lab results, iron level is adequate, no need iv iron for now, thanks  Malachy MoodYan Feng  11/30/2017

## 2017-11-30 NOTE — Telephone Encounter (Signed)
Per Dr. Mosetta PuttFeng notified patient iron level is adequate, no need for IV iron at presents.  Patient verbalized an understanding.

## 2017-12-06 ENCOUNTER — Other Ambulatory Visit (INDEPENDENT_AMBULATORY_CARE_PROVIDER_SITE_OTHER): Payer: Medicare Other

## 2017-12-06 DIAGNOSIS — E1165 Type 2 diabetes mellitus with hyperglycemia: Secondary | ICD-10-CM | POA: Diagnosis not present

## 2017-12-06 LAB — LIPID PANEL
CHOL/HDL RATIO: 2
CHOLESTEROL: 165 mg/dL (ref 0–200)
HDL: 79.4 mg/dL (ref >39.00)
LDL CALC: 69 mg/dL (ref 0–99)
NonHDL: 85.42
TRIGLYCERIDES: 80 mg/dL (ref 0.0–149.0)
VLDL: 16 mg/dL (ref 0.0–40.0)

## 2017-12-06 LAB — COMPREHENSIVE METABOLIC PANEL
ALK PHOS: 78 U/L (ref 39–117)
ALT: 19 U/L (ref 0–35)
AST: 23 U/L (ref 0–37)
Albumin: 4.4 g/dL (ref 3.5–5.2)
BILIRUBIN TOTAL: 0.5 mg/dL (ref 0.2–1.2)
BUN: 34 mg/dL — ABNORMAL HIGH (ref 6–23)
CO2: 26 mEq/L (ref 19–32)
CREATININE: 1.23 mg/dL — AB (ref 0.40–1.20)
Calcium: 10.2 mg/dL (ref 8.4–10.5)
Chloride: 104 mEq/L (ref 96–112)
GFR: 56.05 mL/min — ABNORMAL LOW (ref >60.00)
Glucose, Bld: 136 mg/dL — ABNORMAL HIGH (ref 70–99)
Potassium: 3.7 mEq/L (ref 3.5–5.1)
Sodium: 141 mEq/L (ref 135–145)
TOTAL PROTEIN: 8.1 g/dL (ref 6.0–8.3)

## 2017-12-06 LAB — MICROALBUMIN / CREATININE URINE RATIO
CREATININE, U: 274.6 mg/dL
MICROALB/CREAT RATIO: 16.3 mg/g (ref 0.0–30.0)
Microalb, Ur: 44.7 mg/dL — ABNORMAL HIGH (ref 0.0–1.9)

## 2017-12-06 LAB — HEMOGLOBIN A1C: Hgb A1c MFr Bld: 6.9 % — ABNORMAL HIGH (ref 4.6–6.5)

## 2017-12-10 ENCOUNTER — Other Ambulatory Visit: Payer: Medicare Other

## 2017-12-11 ENCOUNTER — Other Ambulatory Visit: Payer: Self-pay | Admitting: Internal Medicine

## 2017-12-11 DIAGNOSIS — Z1231 Encounter for screening mammogram for malignant neoplasm of breast: Secondary | ICD-10-CM

## 2017-12-14 ENCOUNTER — Ambulatory Visit (INDEPENDENT_AMBULATORY_CARE_PROVIDER_SITE_OTHER): Payer: Medicare Other | Admitting: Endocrinology

## 2017-12-14 ENCOUNTER — Encounter: Payer: Self-pay | Admitting: Endocrinology

## 2017-12-14 VITALS — BP 124/70 | HR 82 | Ht 63.0 in | Wt 224.6 lb

## 2017-12-14 DIAGNOSIS — I10 Essential (primary) hypertension: Secondary | ICD-10-CM

## 2017-12-14 DIAGNOSIS — E1129 Type 2 diabetes mellitus with other diabetic kidney complication: Secondary | ICD-10-CM

## 2017-12-14 DIAGNOSIS — R809 Proteinuria, unspecified: Secondary | ICD-10-CM

## 2017-12-14 DIAGNOSIS — E1165 Type 2 diabetes mellitus with hyperglycemia: Secondary | ICD-10-CM

## 2017-12-14 LAB — GLUCOSE, POCT (MANUAL RESULT ENTRY): POC Glucose: 100 mg/dl — AB (ref 70–99)

## 2017-12-14 MED ORDER — BENAZEPRIL-HYDROCHLOROTHIAZIDE 10-12.5 MG PO TABS: 1.0000 | ORAL_TABLET | Freq: Every day | ORAL | 1 refills | Status: DC

## 2017-12-14 NOTE — Progress Notes (Signed)
Patient ID: Megan Fischer, female   DOB: [birth year], 67 y.o.   MRN: [id number]   Reason for Appointment: Follow-up  History of Present Illness   Diagnosis: Type 2 DIABETES MELITUS, date of diagnosis:  2000     Previous history: She has been on various regimens of treatment in the past including [place] and generally has done better with GLP-1 drugs She continues to take low-dose glimepiride which helps with overall control. Usually blood sugars are not variable Because of occasional hypoglycemia her Amaryl was stopped in 03/2014  Recent history: Non-insulin hypoglycemic drugs: Metformin ER 500 mg, 4 tablets at dinner, Victoza 1.8 mg daily     She has been on Victoza since at least 2013   Her A1c is back down to 6.9% and previously 7.3  Current blood sugar patterns and problems identified:  She did bring her monitor for download today  She does not consistently watch her diet with getting some regular soft drinks or sweet tea but she thinks her portions are small  Her weight is up slightly  No side effects from the dose and she appears to have fairly good postprandial readings most of the time  Generally she is still fairly active with taking care of grandchildren  Does not think she is going off her diet her weight is about the same     Side effects from medications: None  Monitors blood glucose: once or twice a day.    Glucometer:  One Touch   Fasting readings range from 96-1 43 Nonfasting 87-186 Overall MEDIAN 127 with average blood sugar highest 143 after supper   Wt Readings from Last 3 Encounters:  12/14/17 224 lb 9.6 oz (101.9 kg)  09/21/17 226 lb 8 oz (102.7 kg)  09/07/17 221 lb (100.2 kg)   Lab Results  Component Value Date   HGBA1C 6.9 (H) 12/06/2017   HGBA1C 7.3 (H) 09/03/2017   HGBA1C 6.8 (H) 12/25/2016   Lab Results  Component Value Date   MICROALBUR 44.7 (H) 12/06/2017   LDLCALC 69 12/06/2017   CREATININE 1.23 (H) 12/06/2017   Other  problems discussed today: See review of systems    Office Visit on 12/14/2017  Component Date Value Ref Range Status  . POC Glucose 12/14/2017 100* 70 - 99 mg/dl Final    Allergies as of 12/14/2017      Reactions   Codeine    Oxycodone Hcl    Penicillins    Chlorhexidine Gluconate Rash      Medication List        Accurate as of 12/14/17 11:59 PM. Always use your most recent med list.          benazepril-hydrochlorthiazide 10-12.5 MG tablet Commonly known as:  LOTENSIN HCT Take 1 tablet by mouth daily.   CALCIUM PO Take by mouth 2 (two) times daily.   cloNIDine 0.2 mg/24hr patch Commonly known as:  CATAPRES - Dosed in mg/24 hr Place 1 patch (0.2 mg total) onto the skin once a week.   diltiazem 360 MG 24 hr capsule Commonly known as:  CARDIZEM CD TAKE 1 CAPSULE BY MOUTH  DAILY   ergocalciferol 50000 units capsule Commonly known as:  VITAMIN D2 Take 50,000 Units by mouth once a week.   ferrous sulfate 325 (65 FE) MG EC tablet Take 1 tablet (325 mg total) by mouth 2 (two) times daily.   Insulin Pen Needle 32G X 4 MM Misc Use one per day to inject Victoza  BD PEN NEEDLE NANO U/F 32G X 4 MM Misc Generic drug:  Insulin Pen Needle USE ONE PER DAY TO INJECT  VICTOZA   metFORMIN 500 MG 24 hr tablet Commonly known as:  GLUCOPHAGE-XR TAKE 4 TABLETS BY MOUTH  DAILY WITH SUPPER.   metoprolol succinate 100 MG 24 hr tablet Commonly known as:  TOPROL-XL TAKE 1 TABLET BY MOUTH  DAILY WITH OR IMMEDIATELY  FOLLOWING A MEAL   ONETOUCH DELICA LANCETS FINE Misc Use to obtain a blood specimen 2 times per day dx code 250.02   ONETOUCH VERIO IQ SYSTEM w/Device Kit Use to check blood sugar 2 times per day dx code 250.02   ONETOUCH VERIO test strip Generic drug:  glucose blood USE AS DIRECTED TO CHECK  BLOOD SUGAR TWICE DAILY   rosuvastatin 10 MG tablet Commonly known as:  CRESTOR TAKE 1 TABLET BY MOUTH  DAILY   VICTOZA 18 MG/3ML Sopn Generic drug:  liraglutide INJECT  SUBCUTANEOUSLY 1.8MG DAILY   warfarin 5 MG tablet Commonly known as:  COUMADIN Take as directed by the anticoagulation clinic. If you are unsure how to take this medication, talk to your nurse or doctor. Original instructions:  TAKE AS DIRECTED BY  COUMADIN CLINIC       Allergies:  Allergies  Allergen Reactions  . Codeine   . Oxycodone Hcl   . Penicillins   . Chlorhexidine Gluconate Rash    Past Medical History:  Diagnosis Date  . Atrial fibrillation ([place])   . Chronic disease anemia 03/29/2011  . Diabetes mellitus   . Dyslipidemia   . Hypertension   . Obesities, morbid ([place])   . Osteoarthritis     Past Surgical History:  Procedure Laterality Date  . CHOLECYSTECTOMY    . REPLACEMENT TOTAL KNEE BILATERAL    . SHOULDER SURGERY     RIGHT SHOULDER  . TUBAL LIGATION      Family History  Problem Relation Age of Onset  . Heart attack Mother   . Stroke Mother   . Diabetes Mother   . Hypertension Brother     Social History:  reports that she has never smoked. She has never used smokeless tobacco. She reports that she does not drink alcohol or use drugs.  Review of Systems:  History of atrial fibrillation: She is on Coumadin and followed by cardiologist  Hypertension:  she has required multiple medications and blood pressure is usually well controlled She is currently taking diltiazem, metoprolol, Catapres patch and HCT No lightheadedness but her blood pressure is lower than usual  Is compliant with her medications  BP Readings from Last 3 Encounters:  12/14/17 124/70  10/12/17 138/78  10/05/17 (!) 163/132     Lipids:  well controlled with Crestor and LDL is as follows:   Lab Results  Component Value Date   CHOL 165 12/06/2017   HDL 79.40 12/06/2017   LDLCALC 69 12/06/2017   TRIG 80.0 12/06/2017   CHOLHDL 2 12/06/2017    History of goiter, long-standing which is euthyroid, tends to have relatively low TSH This is about the same She has normal T3  and free T4 levels fairly consistently   Lab Results  Component Value Date   T3FREE 3.0 09/03/2017   T3FREE 3.2 09/25/2016      Lab Results  Component Value Date   FREET4 1.07 09/03/2017   FREET4 1.23 09/25/2016   FREET4 1.32 06/29/2016   TSH 0.17 (L) 09/03/2017   TSH 0.21 (L) 09/25/2016   TSH 0.17 (  L) 06/29/2016      Examination:   BP 124/70 (BP Location: Left Arm, Patient Position: Sitting, Cuff Size: Normal)   Pulse 82   Ht 5' 3"  (1.6 m)   Wt 224 lb 9.6 oz (101.9 kg)   SpO2 98%   BMI 39.79 kg/m   Body mass index is 39.79 kg/m.      ASSESSMENT/ PLAN:   Diabetes type 2 With obesity on metformin and Victoza maximum doses  See history of present illness for detailed discussion of current diabetes management, blood sugar patterns and problems identified  Her A1c is better at 6.9 Home blood sugars are also fairly good She is able to keep her weight down recently  For now we will keep her on the same regimen of metformin and Victoza   HYPERTENSION:  Blood pressure is low normal with her 25 mg of HCTZ Also creatinine is slightly higher than before Currently does not appear to have nephropathy with mild increase in microalbumin  She will stop the 25 mcg of HCTZ and start 10/12.5 mg of benazepril HCT for now Will have her follow-up in 2 months    Elayne Snare 12/16/2017, 9:52 PM   Note: This office note was prepared with Dragon voice recognition system technology. Any transcriptional errors that result from this process are unintentional.

## 2017-12-14 NOTE — Patient Instructions (Addendum)
Stop HCTZ

## 2017-12-21 ENCOUNTER — Ambulatory Visit (INDEPENDENT_AMBULATORY_CARE_PROVIDER_SITE_OTHER): Payer: Medicare Other | Admitting: *Deleted

## 2017-12-21 DIAGNOSIS — Z5181 Encounter for therapeutic drug level monitoring: Secondary | ICD-10-CM

## 2017-12-21 DIAGNOSIS — I4891 Unspecified atrial fibrillation: Secondary | ICD-10-CM

## 2017-12-21 LAB — POCT INR: INR: 1.5 — AB (ref 2.0–3.0)

## 2017-12-21 NOTE — Patient Instructions (Signed)
Description   Today take 1 tablet, tomorrow take 1.5 tablets, then continue taking 1 tablet every day except 1/2 tablet on Mondays, Wednesdays and Fridays.  Recheck INR in 2 weeks. Call us with any medication changes or concerns #[phone].

## 2018-01-08 ENCOUNTER — Ambulatory Visit (INDEPENDENT_AMBULATORY_CARE_PROVIDER_SITE_OTHER): Payer: Medicare Other | Admitting: *Deleted

## 2018-01-08 DIAGNOSIS — I4891 Unspecified atrial fibrillation: Secondary | ICD-10-CM

## 2018-01-08 DIAGNOSIS — Z5181 Encounter for therapeutic drug level monitoring: Secondary | ICD-10-CM | POA: Diagnosis not present

## 2018-01-08 LAB — POCT INR: INR: 2 (ref 2.0–3.0)

## 2018-01-09 ENCOUNTER — Ambulatory Visit
Admission: RE | Admit: 2018-01-09 | Discharge: 2018-01-09 | Disposition: A | Discharge status: Home / Self Care | Payer: Medicare Other | Source: Ambulatory Visit | Attending: Internal Medicine | Admitting: Internal Medicine

## 2018-01-09 DIAGNOSIS — Z1231 Encounter for screening mammogram for malignant neoplasm of breast: Secondary | ICD-10-CM

## 2018-01-15 ENCOUNTER — Other Ambulatory Visit: Payer: Self-pay | Admitting: Emergency Medicine

## 2018-01-15 MED ORDER — INSULIN PEN NEEDLE 32G X 4 MM MISC: 3 refills | Status: DC

## 2018-01-16 ENCOUNTER — Telehealth: Payer: Self-pay | Admitting: Nurse Practitioner

## 2018-01-16 NOTE — Progress Notes (Signed)
[facility] NOTE  CC  [name], MD [address] [place] [address] Dr. Elayne Snare  DIAGNOSIS: 67 y.o. female with anemia of chronic disease.  PRIOR THERAPY:  #1 patient has received Aranesp injections every 4-8 weeks her last injection was given on 08/04/16 and prior was in January 2013.  #2 patient received IV Feraheme on 07/28/16 and 08/04/16  CURRENT THERAPY:Aranesp and iv iron as needed   INTERVAL HISTORY:  Megan Fischer 67 y.o. female returns for follow-up visit. She is here alone at the clinic. She is doing well and feeling great. She denies feeling tired and has no new concerns. She sees Dr. Dwyane Dee, her PCP.  She says that she has her coumadin levels checked every week.   MEDICAL HISTORY: Past Medical History:  Diagnosis Date  . Atrial fibrillation ([place])   . Chronic disease anemia 03/29/2011  . Diabetes mellitus   . Dyslipidemia   . Hypertension   . Obesities, morbid ([place])   . Osteoarthritis     ALLERGIES:  is allergic to codeine; oxycodone hcl; penicillins; and chlorhexidine gluconate.  MEDICATIONS:  Current Outpatient Medications  Medication Sig Dispense Refill  . benazepril-hydrochlorthiazide (LOTENSIN HCT) 10-12.5 MG tablet Take 1 tablet by mouth daily. 30 tablet 1  . Blood Glucose Monitoring Suppl (ONETOUCH VERIO IQ SYSTEM) W/DEVICE KIT Use to check blood sugar 2 times per day dx code 250.02 1 kit 0  . CALCIUM PO Take by mouth 2 (two) times daily.      . cloNIDine (CATAPRES-TTS-2) 0.2 mg/24hr patch Place 1 patch (0.2 mg total) onto the skin once a week. 4 patch 2  . diltiazem (CARDIZEM CD) 360 MG 24 hr capsule TAKE 1 CAPSULE BY MOUTH  DAILY 90 capsule 1  . ergocalciferol (VITAMIN D2) 50000 UNITS capsule Take 50,000 Units by mouth once a week.      . ferrous sulfate 325 (65 FE) MG EC tablet Take 1 tablet (325 mg total) by mouth 2 (two) times daily. 60 tablet 3  . Insulin Pen Needle (BD PEN NEEDLE NANO U/F) 32G X 4 MM  MISC Use one per day to inject Victoza 90 each 1  . Insulin Pen Needle (BD PEN NEEDLE NANO U/F) 32G X 4 MM MISC USE ONE PER DAY TO INJECT  VICTOZA 90 each 3  . metFORMIN (GLUCOPHAGE-XR) 500 MG 24 hr tablet TAKE 4 TABLETS BY MOUTH  DAILY WITH SUPPER. 360 tablet 3  . metoprolol succinate (TOPROL-XL) 100 MG 24 hr tablet TAKE 1 TABLET BY MOUTH  DAILY WITH OR IMMEDIATELY  FOLLOWING A MEAL 90 tablet 3  . ONETOUCH DELICA LANCETS FINE MISC Use to obtain a blood specimen 2 times per day dx code 250.02 200 each 0  . ONETOUCH VERIO test strip USE AS DIRECTED TO CHECK  BLOOD SUGAR TWICE DAILY 200 each 2  . rosuvastatin (CRESTOR) 10 MG tablet TAKE 1 TABLET BY MOUTH  DAILY 90 tablet 2  . VICTOZA 18 MG/3ML SOPN INJECT SUBCUTANEOUSLY 1.8MG DAILY 27 mL 2  . warfarin (COUMADIN) 5 MG tablet TAKE AS DIRECTED BY  COUMADIN CLINIC 100 tablet 1   No current facility-administered medications for this visit.     SURGICAL HISTORY:  Past Surgical History:  Procedure Laterality Date  . CHOLECYSTECTOMY    . REPLACEMENT TOTAL KNEE BILATERAL    . SHOULDER SURGERY     RIGHT SHOULDER  . TUBAL LIGATION      REVIEW OF SYSTEMS:  Constitutional: Denies fevers,  chills or abnormal night sweats Eyes: Denies blurriness of vision, double vision or watery eyes Ears, nose, mouth, throat, and face: Denies mucositis or sore throat Respiratory: Denies cough, dyspnea or wheezes Cardiovascular: Denies palpitation, chest discomfort or lower extremity swelling Gastrointestinal:  Denies nausea, heartburn or change in bowel habits Skin: Denies abnormal skin rashes Lymphatics: Denies new lymphadenopathy or easy bruising Neurological:Denies numbness, tingling or new weaknesses Behavioral/Psych: Mood is stable, no new changes  All other systems were reviewed with the patient and are negative.   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  Pulse 84   Temp 98.4  F (36.9 C) (Oral)   Resp 17   Ht 5' 3"  (1.6 m)   Wt 219 lb 3.2 oz (99.4 kg)   SpO2 95%   BMI 38.83 kg/m    General: Patient is a well appearing female in no acute distress HEENT: PERRLA, sclerae anicteric no conjunctival pallor, MMM, does have some thyromegaly Neck: supple, no palpable adenopathy Lungs: clear to auscultation bilaterally, no wheezes, rhonchi, or rales Cardiovascular: regular rate rhythm, S1, S2, no murmurs, rubs or gallops (+) atrial fibrillation  Abdomen: Soft, non-tender, non-distended, normoactive bowel sounds, no HSM Extremities: warm and well perfused, no clubbing, cyanosis, or edema Skin: No rashes or lesions Neuro: Non-focal ECOG PERFORMANCE STATUS: 0  LABORATORY DATA: CBC Latest Ref Rng & Units 01/18/2018 11/16/2017 11/16/2017  WBC 3.9 - 10.3 K/uL 5.7 5.1 -  Hemoglobin 11.6 - 15.9 g/dL 14.3 12.8 -  Hematocrit 34.8 - 46.6 % 43.5 38.9 38.5  Platelets 145 - 400 K/uL 153 201 -    CMP Latest Ref Rng & Units 12/06/2017 09/21/2017 09/03/2017  Glucose 70 - 99 mg/dL 136(H) 105 108(H)  BUN 6 - 23 mg/dL 34(H) 26 29(H)  Creatinine 0.40 - 1.20 mg/dL 1.23(H) 0.99 1.08  Sodium 135 - 145 mEq/L 141 141 141  Potassium 3.5 - 5.1 mEq/L 3.7 4.2 4.2  Chloride 96 - 112 mEq/L 104 107 105  CO2 19 - 32 mEq/L 26 26 29   Calcium 8.4 - 10.5 mg/dL 10.2 9.7 9.8  Total Protein 6.0 - 8.3 g/dL 8.1 7.4 -  Total Bilirubin 0.2 - 1.2 mg/dL 0.5 0.5 -  Alkaline Phos 39 - 117 U/L 78 91 -  AST 0 - 37 U/L 23 23 -  ALT 0 - 35 U/L 19 20 -   Results for Megan Fischer, Megan Fischer (MRN [id number]) as of 08/31/2016 08:53  Ref. Range 07/21/2016 08:56  Iron Latest Ref Range: 41 - 142 ug/dL 35 (L)  UIBC Latest Ref Range: 120 - 384 ug/dL 336  TIBC Latest Ref Range: 236 - 444 ug/dL 371  %SAT Latest Ref Range: 21 - 57 % 10 (L)  Ferritin Latest Ref Range: 9 - 269 ng/ml 52  Folate, Hemolysate Latest Ref Range: Not Estab. ng/mL 446.0  Folate, RBC Latest Ref Range: >498 ng/mL 1,527    RADIOGRAPHIC STUDIES:  No  results found.  ASSESSMENT:  67 y.o. female   1. Anemia of chronic disease and iron deficiency  -Her initial workup showed normal ferritin and serum iron level, slightly low iron saturation, normal G86 and folic acid. Normal erythropoietin level. SPEP was negative.  -Given her multiple comorbidities, especially diabetes, this is likely anemia of chronic disease. -Low risk is also a possibility. Given her stable mild anemia, no other cytopenia, I'll hold bone marrow biopsy for now. We discussed she may need a bone marrow  biopsy if her anemia gets much worse, or if she develops other cytopenia. -She responded well to Aranesp injection, and has not required any treatment since 2013, but she also lost to follow-up. -Labs reviewed on 07/21/16. Her anemia was worse than when I saw her in 2016. Hgb 9.4 that day. I repeated her iron study, F48, and folic acid level. -Iron low at 35 ug/dL, %SAT low at 10%, Folate RBC low at 29.2%, Methylmalonic Acid (B12) was normal at 193 nmol/L. -she has restart Aranesp injection 175mg every 4-8 weeks on 08/04/16.  She also received IV Feraheme in May and June 2019.  She responded very well, anemia resolved. --Labs reviewed, Hb 14.3  -Check labs every 2 months to see if she needs another Aranesp injection if Hb<11. -Her last colonoscopy was several years. I have encouraged her to get one soon and educated her about occult bleeding.  She is on Coumadin, has risk for bleeding. -She gets annual mammograms.  -She takes oral iron daily, will continue  -f/u in 2 months   2. Diabetes, hypertension, A. fib on Coumadin, morbid RBC -She will continue follow-up with her primary care physician, endocrinologist and cardiologist. -Controlled   Plan . -Lab and Aranesp injection every 2 months if needed  -We will set up IV iron if evidence of iron deficiency  -I'll see her back in 6 months -Hb 14.3. No Aranesp injection today.  I spent 10 minutes counseling the patient face  to face. The total time spent in the appointment was 15 minutes.  IDierdre SearlesDweik am acting as scribe for Dr. YTruitt Merle  I have reviewed the above documentation for accuracy and completeness, and I agree with the above.    YTruitt Merle 01/18/2018

## 2018-01-16 NOTE — Telephone Encounter (Signed)
Called patient to schedule an appointment with Dr. Elease Hashimoto due to paperwork that was received in the office. Patient has not been seen by Dr. Elease Hashimoto since 2013 and she continues to be a patient in our [facility] clinic. I scheduled her for office visit on 9/24.

## 2018-01-18 ENCOUNTER — Telehealth: Payer: Self-pay

## 2018-01-18 ENCOUNTER — Inpatient Hospital Stay (HOSPITAL_BASED_OUTPATIENT_CLINIC_OR_DEPARTMENT_OTHER): Payer: Medicare Other | Admitting: Hematology

## 2018-01-18 ENCOUNTER — Encounter: Payer: Self-pay | Admitting: Hematology

## 2018-01-18 ENCOUNTER — Inpatient Hospital Stay: Payer: Medicare Other | Attending: Hematology

## 2018-01-18 ENCOUNTER — Inpatient Hospital Stay: Payer: Medicare Other

## 2018-01-18 VITALS — BP 143/81 | HR 84 | Temp 98.4°F | Resp 17 | Ht 63.0 in | Wt 219.2 lb

## 2018-01-18 DIAGNOSIS — D638 Anemia in other chronic diseases classified elsewhere: Secondary | ICD-10-CM | POA: Diagnosis not present

## 2018-01-18 DIAGNOSIS — Z7901 Long term (current) use of anticoagulants: Secondary | ICD-10-CM | POA: Diagnosis not present

## 2018-01-18 DIAGNOSIS — I10 Essential (primary) hypertension: Secondary | ICD-10-CM | POA: Insufficient documentation

## 2018-01-18 DIAGNOSIS — D509 Iron deficiency anemia, unspecified: Secondary | ICD-10-CM | POA: Diagnosis not present

## 2018-01-18 DIAGNOSIS — E119 Type 2 diabetes mellitus without complications: Secondary | ICD-10-CM | POA: Diagnosis not present

## 2018-01-18 DIAGNOSIS — I4891 Unspecified atrial fibrillation: Secondary | ICD-10-CM | POA: Insufficient documentation

## 2018-01-18 LAB — CBC WITH DIFFERENTIAL/PLATELET
BASOS ABS: 0 10*3/uL (ref 0.0–0.1)
Basophils Relative: 0 %
Eosinophils Absolute: 0.1 10*3/uL (ref 0.0–0.5)
Eosinophils Relative: 1 %
HEMATOCRIT: 43.5 % (ref 34.8–46.6)
HEMOGLOBIN: 14.3 g/dL (ref 11.6–15.9)
LYMPHS PCT: 32 %
Lymphs Abs: 1.8 10*3/uL (ref 0.9–3.3)
MCH: 29.3 pg (ref 25.1–34.0)
MCHC: 32.9 g/dL (ref 31.5–36.0)
MCV: 89.1 fL (ref 79.5–101.0)
MONO ABS: 0.3 10*3/uL (ref 0.1–0.9)
MONOS PCT: 5 %
NEUTROS ABS: 3.5 10*3/uL (ref 1.5–6.5)
NEUTROS PCT: 62 %
Platelets: 153 10*3/uL (ref 145–400)
RBC: 4.88 MIL/uL (ref 3.70–5.45)
RDW: 15.8 % — AB (ref 11.2–14.5)
WBC: 5.7 10*3/uL (ref 3.9–10.3)

## 2018-01-18 LAB — IRON AND TIBC
Iron: 89 ug/dL (ref 41–142)
Saturation Ratios: 31 % (ref 21–57)
TIBC: 290 ug/dL (ref 236–444)
UIBC: 201 ug/dL

## 2018-01-18 LAB — FERRITIN: FERRITIN: 616 ng/mL — AB (ref 11–307)

## 2018-01-18 NOTE — Telephone Encounter (Signed)
Printed avs and calender of upcoming appointment. Per 9/12 los 

## 2018-01-21 LAB — FOLATE RBC
FOLATE, HEMOLYSATE: 573.6 ng/mL
FOLATE, RBC: 1337 ng/mL (ref >498)
Hematocrit: 42.9 % (ref 34.0–46.6)

## 2018-01-29 ENCOUNTER — Ambulatory Visit (INDEPENDENT_AMBULATORY_CARE_PROVIDER_SITE_OTHER): Payer: Medicare Other | Admitting: Cardiovascular Disease

## 2018-01-29 ENCOUNTER — Encounter: Payer: Self-pay | Admitting: Cardiovascular Disease

## 2018-01-29 VITALS — BP 132/88 | HR 88 | Ht 63.0 in | Wt 226.1 lb

## 2018-01-29 DIAGNOSIS — I4819 Other persistent atrial fibrillation: Secondary | ICD-10-CM

## 2018-01-29 DIAGNOSIS — I481 Persistent atrial fibrillation: Secondary | ICD-10-CM

## 2018-01-29 NOTE — Patient Instructions (Signed)
Medication Instructions:  Your physician recommends that you continue on your current medications as directed. Please refer to the Current Medication list given to you today.  Labwork: None  Testing/Procedures: Your physician has requested that you have an echocardiogram. Echocardiography is a painless test that uses sound waves to create images of your heart. It provides your doctor with information about the size and shape of your heart and how well your heart's chambers and valves are working. This procedure takes approximately one hour. There are no restrictions for this procedure.   Follow-Up: Your physician wants you to follow-up in: 1 year with Dr. Elease HashimotoNahser.  You will receive a reminder letter in the mail two months in advance. If you don't receive a letter, please call our office to schedule the follow-up appointment.   Any Other Special Instructions Will Be Listed Below (If Applicable).     If you need a refill on your cardiac medications before your next appointment, please call your pharmacy.

## 2018-01-29 NOTE — Progress Notes (Signed)
Megan Fischer Date of Birth  [birth year] [facility] [address] [place], [place]  [address] [phone]   Problem List 1. HTN 2. Anemia - iron deficiency 3. Diabetes Mellitus 4.   Atrial Fibrillation - [place] [facility] 5. S/p knee replacements bilaterally 6. 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 She presents today for followup visit. She does not have any complaints.  She has lost around 65 lbs since last year.  January 29, 2018:  Megan Fischer is seen today for follow up of her atrial fibrillation.  Her atrial  Fib  is now more persistent.  She is completely asymptomatic and cannot tell when she is in atrial fibrillation or not. Has rare episodes of chest soreness.  She has occasionally woken up with chest pain.  The pains typically get worse if she moves around through the day. Is exercising some.   Chest pains are not worsened with exercise.  There is a pleuritic component to these episodes of chest pain. The pains are definitely   worsened when she twists and turns her torso. No dyspnea   Sees her primary MD regulalry  Does not eat much salt .   Current Outpatient Medications on File Prior to Visit  Medication Sig Dispense Refill  . benazepril-hydrochlorthiazide (LOTENSIN HCT) 10-12.5 MG tablet Take 1 tablet by mouth daily. 30 tablet 1  . Blood Glucose Monitoring Suppl (ONETOUCH VERIO IQ SYSTEM) W/DEVICE KIT Use to check blood sugar 2 times per day dx code 250.02 1 kit 0  . CALCIUM PO Take by mouth 2 (two) times daily.      . cloNIDine (CATAPRES-TTS-2) 0.2 mg/24hr patch Place 1 patch (0.2 mg total) onto the skin once a week. 4 patch 2  . diltiazem (CARDIZEM CD) 360 MG 24 hr capsule TAKE 1 CAPSULE BY MOUTH  DAILY 90 capsule 1  . ergocalciferol (VITAMIN D2)  50000 UNITS capsule Take 50,000 Units by mouth once a week.      . ferrous sulfate 325 (65 FE) MG EC tablet Take 1 tablet (325 mg total) by mouth 2 (two) times daily. 60 tablet 3  . Insulin Pen Needle (BD PEN NEEDLE NANO U/F) 32G X 4 MM MISC Use one per day to inject Victoza 90 each 1  . Insulin Pen Needle (BD PEN NEEDLE NANO U/F) 32G X 4 MM MISC USE ONE PER DAY TO INJECT  VICTOZA 90 each 3  . metFORMIN (GLUCOPHAGE-XR) 500 MG 24 hr tablet TAKE 4 TABLETS BY MOUTH  DAILY WITH SUPPER. 360 tablet 3  . metoprolol succinate (TOPROL-XL) 100 MG 24 hr tablet TAKE 1 TABLET BY MOUTH  DAILY WITH OR IMMEDIATELY  FOLLOWING A MEAL 90 tablet 3  . ONETOUCH DELICA LANCETS FINE MISC Use to obtain a blood specimen 2 times per day dx code 250.02 200 each 0  . ONETOUCH VERIO test strip USE AS DIRECTED TO CHECK  BLOOD SUGAR TWICE DAILY 200 each 2  . rosuvastatin (CRESTOR) 10 MG tablet TAKE 1 TABLET BY MOUTH  DAILY 90 tablet 2  . VICTOZA 18 MG/3ML SOPN INJECT SUBCUTANEOUSLY 1.8MG DAILY 27 mL 2  . warfarin (COUMADIN) 5 MG tablet TAKE AS DIRECTED BY  COUMADIN CLINIC 100 tablet 1   No current facility-administered medications on file prior to visit.  Allergies  Allergen Reactions  . Codeine   . Oxycodone Hcl   . Penicillins   . Chlorhexidine Gluconate Rash    Past Medical History:  Diagnosis Date  . Atrial fibrillation ([place])   . Chronic disease anemia 03/29/2011  . Diabetes mellitus   . Dyslipidemia   . Hypertension   . Obesities, morbid ([place])   . Osteoarthritis     Past Surgical History:  Procedure Laterality Date  . CHOLECYSTECTOMY    . REPLACEMENT TOTAL KNEE BILATERAL    . SHOULDER SURGERY     RIGHT SHOULDER  . TUBAL LIGATION      Social History   Tobacco Use  Smoking Status Never Smoker  Smokeless Tobacco Never Used    Social History   Substance and Sexual Activity  Alcohol Use No    Family History  Problem Relation Age of Onset  . Heart attack Mother   . Stroke Mother   .  Diabetes Mother   . Hypertension Brother   . Breast cancer Neg Hx     Reviw of Systems:  Reviewed in the HPI.  All other systems are negative.  Physical Exam: Blood pressure 132/88, pulse 88, height _0  (1.6 m), weight 226 lb 1.9 oz (102.6 kg), SpO2 98 %.  GEN:  Morbidly obese middle age female,  HEENT: Normal NECK: No JVD; No carotid bruits LYMPHATICS: No lymphadenopathy CARDIAC: Irregularly irregular, chest wall tenderness especially on the left side of the chest.    palpation reproduces her typical chest soreness that she wakes up with. RESPIRATORY:  Clear to auscultation without rales, wheezing or rhonchi  ABDOMEN:  Obese  MUSCULOSKELETAL:  No edema; No deformity  SKIN: Warm and dry NEUROLOGIC:  Alert and oriented x 3     ECG: January 29, 2018: Atrial fibrillation with a heart rate of 86.  No ST or T wave changes. Assessment / Plan:   1.  Atrial for ablation: The patient now has persistent atrial fibrillation.  She is monitored in our Coumadin clinic.  Her heart rate is well controlled.  Continue current medications.  Continue Coumadin as directed by the Coumadin clinic.  Is not had an echocardiogram in over 7 years.  We will repeat her echocardiogram.  2.   Chest soreness: The patient describes a very atypical chest soreness.  She states that she typically wakes up with his pain and it worsens if she twists or turns her torso.  I was able to reproduce the pain with palpation today.  She exercises on a regular basis and typically does not have any episodes of chest pain with exercise.  At this point I think this is a musculoskeletal-like chest pain.  She will call us if these chest pains worsen.  We will see her again in 1 year.

## 2018-02-07 ENCOUNTER — Ambulatory Visit (INDEPENDENT_AMBULATORY_CARE_PROVIDER_SITE_OTHER): Payer: Medicare Other | Admitting: *Deleted

## 2018-02-07 DIAGNOSIS — I4891 Unspecified atrial fibrillation: Secondary | ICD-10-CM

## 2018-02-07 DIAGNOSIS — Z5181 Encounter for therapeutic drug level monitoring: Secondary | ICD-10-CM | POA: Diagnosis not present

## 2018-02-07 LAB — POCT INR: INR: 2.2 (ref 2.0–3.0)

## 2018-02-07 NOTE — Patient Instructions (Signed)
Description   Continue taking 1 tablet every day except 1/2 tablet on Mondays, Wednesdays and Fridays.  Recheck INR in 4 weeks. Call us with any medication changes or concerns #[phone].      

## 2018-02-08 ENCOUNTER — Encounter: Payer: Self-pay | Admitting: Endocrinology

## 2018-02-08 ENCOUNTER — Ambulatory Visit (INDEPENDENT_AMBULATORY_CARE_PROVIDER_SITE_OTHER): Payer: Medicare Other | Admitting: Endocrinology

## 2018-02-08 VITALS — BP 128/80 | HR 72 | Ht 63.0 in | Wt 223.0 lb

## 2018-02-08 DIAGNOSIS — I10 Essential (primary) hypertension: Secondary | ICD-10-CM | POA: Diagnosis not present

## 2018-02-08 DIAGNOSIS — E1165 Type 2 diabetes mellitus with hyperglycemia: Secondary | ICD-10-CM

## 2018-02-08 LAB — COMPREHENSIVE METABOLIC PANEL
ALBUMIN: 4.3 g/dL (ref 3.5–5.2)
ALT: 31 U/L (ref 0–35)
AST: 31 U/L (ref 0–37)
Alkaline Phosphatase: 73 U/L (ref 39–117)
BUN: 23 mg/dL (ref 6–23)
CALCIUM: 10.2 mg/dL (ref 8.4–10.5)
CHLORIDE: 103 meq/L (ref 96–112)
CO2: 30 meq/L (ref 19–32)
Creatinine, Ser: 1.07 mg/dL (ref 0.40–1.20)
GFR: 65.8 mL/min (ref >60.00)
Glucose, Bld: 110 mg/dL — ABNORMAL HIGH (ref 70–99)
Potassium: 4.2 mEq/L (ref 3.5–5.1)
Sodium: 141 mEq/L (ref 135–145)
Total Bilirubin: 0.5 mg/dL (ref 0.2–1.2)
Total Protein: 8.1 g/dL (ref 6.0–8.3)

## 2018-02-08 MED ORDER — SEMAGLUTIDE(0.25 OR 0.5MG/DOS) 2 MG/1.5ML ~~LOC~~ SOPN: 0.5000 mg | PEN_INJECTOR | SUBCUTANEOUS | 2 refills | Status: DC

## 2018-02-08 NOTE — Patient Instructions (Signed)
Check blood sugars on waking up 3 days a week  Also check blood sugars about 2 hours after meals and do this after different meals by rotation  Recommended blood sugar levels on waking up are 90-130 and about 2 hours after meal is 130-160  Please bring your blood sugar monitor to each visit, thank you   

## 2018-02-08 NOTE — Progress Notes (Signed)
Patient ID: Megan Fischer, female   DOB: 12-22-1950, 67 y.o.   MRN: [id number]   Reason for Appointment: Follow-up  History of Present Illness   Diagnosis: Type 2 DIABETES MELITUS, date of diagnosis:  2000     Previous history: She has been on various regimens of treatment in the past including Byetta and generally has done better with GLP-1 drugs She continues to take low-dose glimepiride which helps with overall control. Usually blood sugars are not variable Because of occasional hypoglycemia her Amaryl was stopped in 03/2014  Recent history: Non-insulin hypoglycemic drugs: Metformin ER 500 mg, 4 tablets at dinner, Victoza 1.8 mg daily     She has been on Victoza since at least 2013 in am  Her A1c is back down to 6.9% and previously 7.3  Current blood sugar patterns and problems identified:  She did bring her monitor for download today  She does not know why her blood sugars are higher including fasting, usually not having late night snacks that are high in carbohydrate  She thinks she is taking her Victoza daily in the morning and no troubles with taking for metformin's  Previously was asked to cut back on regular soft drinks  Her weight is the same  Blood sugars tend to be high even fasting although not clear if some of the readings at 10 AM or before or after eating  Although she thinks her diet is fairly good she will have blood sugars as high as 242 after meals, again likely to be from occasional regular soft drinks  Still not able to do much physical activity for exercise     Side effects from medications: None  Monitors blood glucose: once or twice a day.    Glucometer:  One Touch    PRE-MEAL Fasting Lunch Dinner Bedtime Overall  Glucose range:  103-200      Mean/median:  144     140+/-33   POST-MEAL PC Breakfast PC Lunch PC Dinner  Glucose range:    137-242  Mean/median:  98-154   60      Wt Readings from Last 3 Encounters:  02/08/18 223 lb  (101.2 kg)  01/29/18 226 lb 1.9 oz (102.6 kg)  01/18/18 219 lb 3.2 oz (99.4 kg)   Lab Results  Component Value Date   HGBA1C 6.9 (H) 12/06/2017   HGBA1C 7.3 (H) 09/03/2017   HGBA1C 6.8 (H) 12/25/2016   Lab Results  Component Value Date   MICROALBUR 44.7 (H) 12/06/2017   LDLCALC 69 12/06/2017   CREATININE 1.23 (H) 12/06/2017   Other problems discussed today: See review of systems    Anti-coag visit on 02/07/2018  Component Date Value Ref Range Status  . INR 02/07/2018 2.2  2.0 - 3.0 Final    Allergies as of 02/08/2018      Reactions   Codeine    Oxycodone Hcl    Penicillins    Chlorhexidine Gluconate Rash      Medication List        Accurate as of 02/08/18  8:10 AM. Always use your most recent med list.          benazepril-hydrochlorthiazide 10-12.5 MG tablet Commonly known as:  LOTENSIN HCT Take 1 tablet by mouth daily.   CALCIUM PO Take by mouth 2 (two) times daily.   cloNIDine 0.2 mg/24hr patch Commonly known as:  CATAPRES - Dosed in mg/24 hr Place 1 patch (0.2 mg total) onto the skin once a week.  diltiazem 360 MG 24 hr capsule Commonly known as:  CARDIZEM CD TAKE 1 CAPSULE BY MOUTH  DAILY   ergocalciferol 50000 units capsule Commonly known as:  VITAMIN D2 Take 50,000 Units by mouth once a week.   ferrous sulfate 325 (65 FE) MG EC tablet Take 1 tablet (325 mg total) by mouth 2 (two) times daily.   Insulin Pen Needle 32G X 4 MM Misc Use one per day to inject Victoza   Insulin Pen Needle 32G X 4 MM Misc USE ONE PER DAY TO INJECT  VICTOZA   metFORMIN 500 MG 24 hr tablet Commonly known as:  GLUCOPHAGE-XR TAKE 4 TABLETS BY MOUTH  DAILY WITH SUPPER.   metoprolol succinate 100 MG 24 hr tablet Commonly known as:  TOPROL-XL TAKE 1 TABLET BY MOUTH  DAILY WITH OR IMMEDIATELY  FOLLOWING A MEAL   ONETOUCH DELICA LANCETS FINE Misc Use to obtain a blood specimen 2 times per day dx code 250.02   ONETOUCH VERIO IQ SYSTEM w/Device Kit Use to check  blood sugar 2 times per day dx code 250.02   ONETOUCH VERIO test strip Generic drug:  glucose blood USE AS DIRECTED TO CHECK  BLOOD SUGAR TWICE DAILY   rosuvastatin 10 MG tablet Commonly known as:  CRESTOR TAKE 1 TABLET BY MOUTH  DAILY   VICTOZA 18 MG/3ML Sopn Generic drug:  liraglutide INJECT SUBCUTANEOUSLY 1.8MG DAILY   warfarin 5 MG tablet Commonly known as:  COUMADIN Take as directed by the anticoagulation clinic. If you are unsure how to take this medication, talk to your nurse or doctor. Original instructions:  TAKE AS DIRECTED BY  COUMADIN CLINIC       Allergies:  Allergies  Allergen Reactions  . Codeine   . Oxycodone Hcl   . Penicillins   . Chlorhexidine Gluconate Rash    Past Medical History:  Diagnosis Date  . Atrial fibrillation ([place])   . Chronic disease anemia 03/29/2011  . Diabetes mellitus   . Dyslipidemia   . Hypertension   . Obesities, morbid ([place])   . Osteoarthritis     Past Surgical History:  Procedure Laterality Date  . CHOLECYSTECTOMY    . REPLACEMENT TOTAL KNEE BILATERAL    . SHOULDER SURGERY     RIGHT SHOULDER  . TUBAL LIGATION      Family History  Problem Relation Age of Onset  . Heart attack Mother   . Stroke Mother   . Diabetes Mother   . Hypertension Brother   . Breast cancer Neg Hx     Social History:  reports that she has never smoked. She has never used smokeless tobacco. She reports that she does not drink alcohol or use drugs.  Review of Systems:  History of atrial fibrillation: She is on Coumadin and followed by cardiologist  Hypertension:  she has required multiple medications and blood pressure is usually well controlled She is currently taking diltiazem, metoprolol, Catapres patch Since her blood pressure was relatively low on her last visit the 25 mg HCTZ was stopped and she is now on benazepril HCT 10/12.5 Blood pressure is improved  Is compliant with her medications daily  BP Readings from Last 3  Encounters:  02/08/18 128/80  01/29/18 132/88  01/18/18 (!) 143/81     Lipids:  well controlled with Crestor and LDL is as follows:   Lab Results  Component Value Date   CHOL 165 12/06/2017   HDL 79.40 12/06/2017   LDLCALC 69 12/06/2017   TRIG 80.0  12/06/2017   CHOLHDL 2 12/06/2017    History of goiter, long-standing which is euthyroid, tends to have relatively low TSH This is about the same She has normal T3 and free T4 levels consistently   Lab Results  Component Value Date   T3FREE 3.0 09/03/2017   T3FREE 3.2 09/25/2016      Lab Results  Component Value Date   FREET4 1.07 09/03/2017   FREET4 1.23 09/25/2016   FREET4 1.32 06/29/2016   TSH 0.17 (L) 09/03/2017   TSH 0.21 (L) 09/25/2016   TSH 0.17 (L) 06/29/2016      Examination:   BP 128/80   Pulse 72   Ht 5' 3"  (1.6 m)   Wt 223 lb (101.2 kg)   SpO2 98%   BMI 39.50 kg/m   Body mass index is 39.5 kg/m.      ASSESSMENT/ PLAN:   Diabetes type 2 With obesity on metformin and Victoza maximum doses  See history of present illness for detailed discussion of current diabetes management, blood sugar patterns and problems identified  Her A1c was previously better at 6.9  However her blood sugars at home are increasing and is having variable blood sugars at all times including fasting Although her diet can be more consistent and she can lose weight she is not having any consistent periods of high blood sugar  Discussed that she may do better with Ozempic instead of Victoza especially higher doses Showed her how the Ozempic pen is different and was started on 0.25 mg with the first injection and subsequently 0.5 mg weekly until her next visit At that time will consider 1 mg Ozempic unless blood sugars are improved   HYPERTENSION:  Blood pressure is better with 10/12.5 mg of benazepril HCT She will continue her other medications also We will check serum chemistry as creatinine was slightly high on the  last visit  Will have her follow-up in 2 months    Kahle Mcqueen 02/08/2018, 8:10 AM   Note: This office note was prepared with Dragon voice recognition system technology. Any transcriptional errors that result from this process are unintentional.

## 2018-02-11 ENCOUNTER — Other Ambulatory Visit: Payer: Self-pay | Admitting: Endocrinology

## 2018-02-18 ENCOUNTER — Other Ambulatory Visit: Payer: Self-pay | Admitting: Endocrinology

## 2018-03-01 ENCOUNTER — Ambulatory Visit (HOSPITAL_COMMUNITY): Payer: Medicare Other | Attending: Internal Medicine

## 2018-03-01 ENCOUNTER — Other Ambulatory Visit: Payer: Self-pay

## 2018-03-01 ENCOUNTER — Ambulatory Visit (INDEPENDENT_AMBULATORY_CARE_PROVIDER_SITE_OTHER): Payer: Medicare Other | Admitting: Pharmacist

## 2018-03-01 DIAGNOSIS — I4819 Other persistent atrial fibrillation: Secondary | ICD-10-CM

## 2018-03-01 DIAGNOSIS — Z5181 Encounter for therapeutic drug level monitoring: Secondary | ICD-10-CM | POA: Diagnosis not present

## 2018-03-01 DIAGNOSIS — I4891 Unspecified atrial fibrillation: Secondary | ICD-10-CM

## 2018-03-01 LAB — POCT INR: INR: 2.7 (ref 2.0–3.0)

## 2018-03-01 NOTE — Patient Instructions (Signed)
Description   Continue taking 1 tablet every day except 1/2 tablet on Mondays, Wednesdays and Fridays.  Recheck INR in 5 weeks. Call us with any medication changes or concerns #[phone].

## 2018-03-15 ENCOUNTER — Inpatient Hospital Stay: Payer: Medicare Other | Attending: Hematology

## 2018-03-15 ENCOUNTER — Inpatient Hospital Stay: Payer: Medicare Other

## 2018-04-08 ENCOUNTER — Other Ambulatory Visit: Payer: Self-pay | Admitting: Cardiovascular Disease

## 2018-04-08 ENCOUNTER — Ambulatory Visit (INDEPENDENT_AMBULATORY_CARE_PROVIDER_SITE_OTHER): Payer: Medicare Other | Admitting: *Deleted

## 2018-04-08 DIAGNOSIS — I4891 Unspecified atrial fibrillation: Secondary | ICD-10-CM | POA: Diagnosis not present

## 2018-04-08 DIAGNOSIS — Z5181 Encounter for therapeutic drug level monitoring: Secondary | ICD-10-CM

## 2018-04-08 LAB — POCT INR: INR: 2.3 (ref 2.0–3.0)

## 2018-04-08 NOTE — Patient Instructions (Signed)
Description   Continue taking 1 tablet every day except 1/2 tablet on Mondays, Wednesdays and Fridays.  Recheck INR in 6 weeks. Call us with any medication changes or concerns #[phone].     

## 2018-04-15 ENCOUNTER — Other Ambulatory Visit (INDEPENDENT_AMBULATORY_CARE_PROVIDER_SITE_OTHER): Payer: Medicare Other

## 2018-04-15 DIAGNOSIS — E1165 Type 2 diabetes mellitus with hyperglycemia: Secondary | ICD-10-CM | POA: Diagnosis not present

## 2018-04-15 LAB — BASIC METABOLIC PANEL
BUN: 49 mg/dL — ABNORMAL HIGH (ref 6–23)
CALCIUM: 10.2 mg/dL (ref 8.4–10.5)
CHLORIDE: 102 meq/L (ref 96–112)
CO2: 25 mEq/L (ref 19–32)
Creatinine, Ser: 1.49 mg/dL — ABNORMAL HIGH (ref 0.40–1.20)
GFR: 44.88 mL/min — ABNORMAL LOW (ref >60.00)
Glucose, Bld: 136 mg/dL — ABNORMAL HIGH (ref 70–99)
Potassium: 4.2 mEq/L (ref 3.5–5.1)
Sodium: 138 mEq/L (ref 135–145)

## 2018-04-15 LAB — HEMOGLOBIN A1C: Hgb A1c MFr Bld: 7.3 % — ABNORMAL HIGH (ref 4.6–6.5)

## 2018-04-19 ENCOUNTER — Encounter: Payer: Self-pay | Admitting: Endocrinology

## 2018-04-19 ENCOUNTER — Ambulatory Visit (INDEPENDENT_AMBULATORY_CARE_PROVIDER_SITE_OTHER): Payer: Medicare Other | Admitting: Endocrinology

## 2018-04-19 VITALS — BP 120/70 | HR 81 | Ht 63.0 in | Wt 231.4 lb

## 2018-04-19 DIAGNOSIS — E1165 Type 2 diabetes mellitus with hyperglycemia: Secondary | ICD-10-CM | POA: Diagnosis not present

## 2018-04-19 DIAGNOSIS — N289 Disorder of kidney and ureter, unspecified: Secondary | ICD-10-CM

## 2018-04-19 NOTE — Progress Notes (Signed)
Patient ID: Megan Fischer, female   DOB: 10-26-1950, 67 y.o.   MRN: [id number]   Reason for Appointment: Follow-up  History of Present Illness   Diagnosis: Type 2 DIABETES MELITUS, date of diagnosis:  2000     Previous history: She has been on various regimens of treatment in the past including Byetta and generally has done better with GLP-1 drugs She continues to take low-dose glimepiride which helps with overall control. Usually blood sugars are not variable Because of occasional hypoglycemia her Amaryl was stopped in 03/2014  Recent history: Non-insulin hypoglycemic drugs: Metformin ER 500 mg, 4 tablets at dinner, O  She has been on Victoza since at least 2013 in am  Her A1c is back up to 7.3  Current blood sugar patterns and problems identified:  She did bring her monitor for download today  This is despite her recent blood sugars at home being better than on her last visit  She is now doing Ozempic since her last visit about 2 months ago when her sugars were averaging 140 but as high as 242 however she has had only one reading over 200 recently her blood sugars most of the time are averaging 140 or below at any given time  Only occasionally may have higher readings in the mornings, possibly after eating  She will also sometimes have juice to drink although she is trying to cut back on regular soft drinks  Her weight has gone up and she is not able to explain why  Her only activity is some walking about 3 blocks to pick up her grandchildren from the school bus stop     Side effects from medications: None  Monitors blood glucose: once or twice a day.    Glucometer:  One Touch    PRE-MEAL  morning Lunch Dinner Bedtime Overall  Glucose range:  93-186   90-166    Mean/median:  129   140  95  117+/-31   POST-MEAL PC Breakfast PC Lunch PC Dinner  Glucose range:   88-219   Mean/median:   109    PREVIOUS readings:  PRE-MEAL Fasting Lunch Dinner Bedtime  Overall  Glucose range:  103-200      Mean/median:  144     140+/-33   POST-MEAL PC Breakfast PC Lunch PC Dinner  Glucose range:    137-242  Mean/median:  98-154   60      Wt Readings from Last 3 Encounters:  04/19/18 231 lb 6.4 oz (105 kg)  02/08/18 223 lb (101.2 kg)  01/29/18 226 lb 1.9 oz (102.6 kg)   Lab Results  Component Value Date   HGBA1C 7.3 (H) 04/15/2018   HGBA1C 6.9 (H) 12/06/2017   HGBA1C 7.3 (H) 09/03/2017   Lab Results  Component Value Date   MICROALBUR 44.7 (H) 12/06/2017   LDLCALC 69 12/06/2017   CREATININE 1.49 (H) 04/15/2018   Other problems discussed today: See review of systems    Lab on 04/15/2018  Component Date Value Ref Range Status  . Sodium 04/15/2018 138  135 - 145 mEq/L Final  . Potassium 04/15/2018 4.2  3.5 - 5.1 mEq/L Final  . Chloride 04/15/2018 102  96 - 112 mEq/L Final  . CO2 04/15/2018 25  19 - 32 mEq/L Final  . Glucose, Bld 04/15/2018 136* 70 - 99 mg/dL Final  . BUN 04/15/2018 49* 6 - 23 mg/dL Final  . Creatinine, Ser 04/15/2018 1.49* 0.40 - 1.20 mg/dL Final  . Calcium 04/15/2018  10.2  8.4 - 10.5 mg/dL Final  . GFR 04/15/2018 44.88* >60.00 mL/min Final  . Hgb A1c MFr Bld 04/15/2018 7.3* 4.6 - 6.5 % Final   Glycemic Control Guidelines for People with Diabetes:Non Diabetic:  <6%Goal of Therapy: <7%Additional Action Suggested:  >8%     Allergies as of 04/19/2018      Reactions   Codeine    Oxycodone Hcl    Penicillins    Chlorhexidine Gluconate Rash      Medication List       Accurate as of April 19, 2018  9:42 AM. Always use your most recent med list.        benazepril-hydrochlorthiazide 10-12.5 MG tablet Commonly known as:  LOTENSIN HCT TAKE 1 TABLET BY MOUTH DAILY   CALCIUM PO Take by mouth 2 (two) times daily.   cloNIDine 0.2 mg/24hr patch Commonly known as:  CATAPRES-TTS-2 Place 1 patch (0.2 mg total) onto the skin once a week.   diltiazem 360 MG 24 hr capsule Commonly known as:  CARDIZEM CD TAKE 1  CAPSULE BY MOUTH  DAILY   ergocalciferol 1.25 MG (50000 UT) capsule Commonly known as:  VITAMIN D2 Take 50,000 Units by mouth once a week.   ferrous sulfate 325 (65 FE) MG EC tablet Take 1 tablet (325 mg total) by mouth 2 (two) times daily.   Insulin Pen Needle 32G X 4 MM Misc Commonly known as:  BD PEN NEEDLE NANO U/F Use one per day to inject Victoza   Insulin Pen Needle 32G X 4 MM Misc Commonly known as:  BD PEN NEEDLE NANO U/F USE ONE PER DAY TO INJECT  VICTOZA   metFORMIN 500 MG 24 hr tablet Commonly known as:  GLUCOPHAGE-XR TAKE 4 TABLETS BY MOUTH  DAILY WITH SUPPER.   metoprolol succinate 100 MG 24 hr tablet Commonly known as:  TOPROL-XL TAKE 1 TABLET BY MOUTH  DAILY WITH OR IMMEDIATELY  FOLLOWING A MEAL   ONETOUCH DELICA LANCETS FINE Misc Use to obtain a blood specimen 2 times per day dx code 250.02   ONETOUCH VERIO IQ SYSTEM w/Device Kit Use to check blood sugar 2 times per day dx code 250.02   ONETOUCH VERIO test strip Generic drug:  glucose blood USE AS DIRECTED TO CHECK  BLOOD SUGAR TWICE DAILY   rosuvastatin 10 MG tablet Commonly known as:  CRESTOR TAKE 1 TABLET BY MOUTH  DAILY   Semaglutide(0.25 or 0.5MG/DOS) 2 MG/1.5ML Sopn Commonly known as:  OZEMPIC (0.25 OR 0.5 MG/DOSE) Inject 0.5 mg into the skin once a week.   warfarin 5 MG tablet Commonly known as:  COUMADIN Take as directed by the anticoagulation clinic. If you are unsure how to take this medication, talk to your nurse or doctor. Original instructions:  TAKE AS DIRECTED BY  COUMADIN CLINIC       Allergies:  Allergies  Allergen Reactions  . Codeine   . Oxycodone Hcl   . Penicillins   . Chlorhexidine Gluconate Rash    Past Medical History:  Diagnosis Date  . Atrial fibrillation ([place])   . Chronic disease anemia 03/29/2011  . Diabetes mellitus   . Dyslipidemia   . Hypertension   . Obesities, morbid ([place])   . Osteoarthritis     Past Surgical History:  Procedure Laterality Date    . CHOLECYSTECTOMY    . REPLACEMENT TOTAL KNEE BILATERAL    . SHOULDER SURGERY     RIGHT SHOULDER  . TUBAL LIGATION  Family History  Problem Relation Age of Onset  . Heart attack Mother   . Stroke Mother   . Diabetes Mother   . Hypertension Brother   . Breast cancer Neg Hx     Social History:  reports that she has never smoked. She has never used smokeless tobacco. She reports that she does not drink alcohol or use drugs.  Review of Systems:  History of atrial fibrillation: She is on Coumadin and followed by cardiologist  Hypertension:  she has required multiple medications and blood pressure is usually well controlled She is currently taking diltiazem, metoprolol, Catapres patch, Lotensin HCT 10/12.5  Blood pressure is excellent  Is compliant with her medications   BP Readings from Last 3 Encounters:  04/19/18 120/70  02/08/18 128/80  01/29/18 132/88   Lab Results  Component Value Date   CREATININE 1.49 (H) 04/15/2018   CREATININE 1.07 02/08/2018   CREATININE 1.23 (H) 12/06/2017     Lipids:  well controlled with Crestor and LDL is as follows:   Lab Results  Component Value Date   CHOL 165 12/06/2017   HDL 79.40 12/06/2017   LDLCALC 69 12/06/2017   TRIG 80.0 12/06/2017   CHOLHDL 2 12/06/2017    History of goiter, long-standing which is euthyroid, tends to have relatively low TSH This is about the same She has normal T3 and free T4 levels consistently   Lab Results  Component Value Date   T3FREE 3.0 09/03/2017   T3FREE 3.2 09/25/2016      Lab Results  Component Value Date   FREET4 1.07 09/03/2017   FREET4 1.23 09/25/2016   FREET4 1.32 06/29/2016   TSH 0.17 (L) 09/03/2017   TSH 0.21 (L) 09/25/2016   TSH 0.17 (L) 06/29/2016   She has had chronic left lower leg edema   Examination:   BP 120/70 (BP Location: Left Arm, Patient Position: Sitting, Cuff Size: Normal)   Pulse 81   Ht 5' 3"  (1.6 m)   Wt 231 lb 6.4 oz (105 kg)   SpO2 97%    BMI 40.99 kg/m   Body mass index is 40.99 kg/m.   Significant swelling of the left lower leg present   ASSESSMENT/ PLAN:   Diabetes type 2 With obesity on metformin and Victoza maximum doses  See history of present illness for detailed discussion of current diabetes management, blood sugar patterns and problems identified  Her A1c was previously better at 6.9 and now 7.3  However her recent blood sugars on her monitor download today look excellent with only occasional high readings and overall average about 125 with readings being checked after meals also Even though she is doing better with Ozempic compared to Victoza her weight recently has gone up For now we will continue her treatment regimen unchanged since she is already having fairly good blood sugars at home  HYPERTENSION:  Blood pressure is controlled However because of her renal dysfunction with adding the benazepril HCT and low normal reading today will have her cut this in half for now Follow-up in 3 months  History of autonomous thyroid function: We will recheck levels on the next visit    Elayne Snare 04/19/2018, 9:42 AM   Note: This office note was prepared with Dragon voice recognition system technology. Any transcriptional errors that result from this process are unintentional.

## 2018-04-19 NOTE — Patient Instructions (Addendum)
Cut Benazepril in 1/2   Check blood sugars on waking up days a week  Also check blood sugars about 2 hours after meals and do this after different meals by rotation  Recommended blood sugar levels on waking up are 90-130 and about 2 hours after meal is 130-160  Please bring your blood sugar monitor to each visit, thank you

## 2018-04-22 ENCOUNTER — Other Ambulatory Visit: Payer: Self-pay | Admitting: Endocrinology

## 2018-05-17 ENCOUNTER — Inpatient Hospital Stay: Payer: Medicare Other | Attending: Hematology

## 2018-05-17 ENCOUNTER — Inpatient Hospital Stay: Payer: Medicare Other

## 2018-05-17 DIAGNOSIS — D638 Anemia in other chronic diseases classified elsewhere: Secondary | ICD-10-CM

## 2018-05-17 DIAGNOSIS — D509 Iron deficiency anemia, unspecified: Secondary | ICD-10-CM | POA: Diagnosis present

## 2018-05-17 LAB — CBC WITH DIFFERENTIAL/PLATELET
Abs Immature Granulocytes: 0.01 10*3/uL (ref 0.00–0.07)
Basophils Absolute: 0 10*3/uL (ref 0.0–0.1)
Basophils Relative: 1 %
Eosinophils Absolute: 0.1 10*3/uL (ref 0.0–0.5)
Eosinophils Relative: 2 %
HCT: 41.2 % (ref 36.0–46.0)
Hemoglobin: 13.3 g/dL (ref 12.0–15.0)
IMMATURE GRANULOCYTES: 0 %
Lymphocytes Relative: 33 %
Lymphs Abs: 2 10*3/uL (ref 0.7–4.0)
MCH: 29 pg (ref 26.0–34.0)
MCHC: 32.3 g/dL (ref 30.0–36.0)
MCV: 89.8 fL (ref 80.0–100.0)
MONOS PCT: 5 %
Monocytes Absolute: 0.3 10*3/uL (ref 0.1–1.0)
NEUTROS PCT: 59 %
Neutro Abs: 3.5 10*3/uL (ref 1.7–7.7)
Platelets: 233 10*3/uL (ref 150–400)
RBC: 4.59 MIL/uL (ref 3.87–5.11)
RDW: 14.6 % (ref 11.5–15.5)
WBC: 6 10*3/uL (ref 4.0–10.5)
nRBC: 0 % (ref 0.0–0.2)

## 2018-05-17 LAB — COMPREHENSIVE METABOLIC PANEL
ALT: 28 U/L (ref 0–44)
AST: 31 U/L (ref 15–41)
Albumin: 4 g/dL (ref 3.5–5.0)
Alkaline Phosphatase: 89 U/L (ref 38–126)
Anion gap: 13 (ref 5–15)
BUN: 19 mg/dL (ref 8–23)
CO2: 25 mmol/L (ref 22–32)
Calcium: 10.6 mg/dL — ABNORMAL HIGH (ref 8.9–10.3)
Chloride: 105 mmol/L (ref 98–111)
Creatinine, Ser: 1.07 mg/dL — ABNORMAL HIGH (ref 0.44–1.00)
GFR calc Af Amer: >60 mL/min (ref >60)
GFR calc non Af Amer: 54 mL/min — ABNORMAL LOW (ref >60)
Glucose, Bld: 111 mg/dL — ABNORMAL HIGH (ref 70–99)
Potassium: 4 mmol/L (ref 3.5–5.1)
Sodium: 143 mmol/L (ref 135–145)
Total Bilirubin: 0.6 mg/dL (ref 0.3–1.2)
Total Protein: 7.9 g/dL (ref 6.5–8.1)

## 2018-05-17 LAB — FERRITIN: Ferritin: 494 ng/mL — ABNORMAL HIGH (ref 11–307)

## 2018-05-17 LAB — IRON AND TIBC
IRON: 59 ug/dL (ref 41–142)
Saturation Ratios: 21 % (ref 21–57)
TIBC: 285 ug/dL (ref 236–444)
UIBC: 226 ug/dL (ref 120–384)

## 2018-05-17 NOTE — Progress Notes (Signed)
Pt HGB is 13.3 today no injection needed gave copy of labs to pt and told her to call if she has any questions or complications

## 2018-05-20 LAB — FOLATE RBC
FOLATE, HEMOLYSATE: 522 ng/mL
Folate, RBC: 1352 ng/mL (ref >498)
Hematocrit: 38.6 % (ref 34.0–46.6)

## 2018-05-21 ENCOUNTER — Ambulatory Visit (INDEPENDENT_AMBULATORY_CARE_PROVIDER_SITE_OTHER): Payer: Medicare Other | Admitting: *Deleted

## 2018-05-21 DIAGNOSIS — I4891 Unspecified atrial fibrillation: Secondary | ICD-10-CM | POA: Diagnosis not present

## 2018-05-21 DIAGNOSIS — Z5181 Encounter for therapeutic drug level monitoring: Secondary | ICD-10-CM | POA: Diagnosis not present

## 2018-05-21 LAB — POCT INR: INR: 2 (ref 2.0–3.0)

## 2018-05-21 NOTE — Patient Instructions (Signed)
Description   Continue taking 1 tablet every day except 1/2 tablet on Mondays, Wednesdays and Fridays.  Recheck INR in 6 weeks. Call us with any medication changes or concerns #[phone].

## 2018-05-27 ENCOUNTER — Other Ambulatory Visit: Payer: Self-pay | Admitting: Endocrinology

## 2018-06-28 ENCOUNTER — Other Ambulatory Visit: Payer: Self-pay | Admitting: Endocrinology

## 2018-07-02 ENCOUNTER — Ambulatory Visit (INDEPENDENT_AMBULATORY_CARE_PROVIDER_SITE_OTHER): Payer: Medicare Other | Admitting: Pharmacist

## 2018-07-02 DIAGNOSIS — Z5181 Encounter for therapeutic drug level monitoring: Secondary | ICD-10-CM

## 2018-07-02 DIAGNOSIS — I4891 Unspecified atrial fibrillation: Secondary | ICD-10-CM

## 2018-07-02 LAB — POCT INR: INR: 2.8 (ref 2.0–3.0)

## 2018-07-02 NOTE — Patient Instructions (Signed)
Description   Continue taking 1 tablet every day except 1/2 tablet on Mondays, Wednesdays and Fridays.  Recheck INR in 6 weeks. Call us with any medication changes or concerns #[phone].

## 2018-07-11 ENCOUNTER — Telehealth: Payer: Self-pay | Admitting: Hematology

## 2018-07-11 NOTE — Telephone Encounter (Signed)
Per sch msg, patient needs to reschedule 3/13 appt. Spoke with patient, moved 3/13 to 3/19. Confirmed date and time with patient

## 2018-07-18 ENCOUNTER — Other Ambulatory Visit (INDEPENDENT_AMBULATORY_CARE_PROVIDER_SITE_OTHER): Payer: Medicare Other

## 2018-07-18 ENCOUNTER — Other Ambulatory Visit: Payer: Self-pay

## 2018-07-18 DIAGNOSIS — E1165 Type 2 diabetes mellitus with hyperglycemia: Secondary | ICD-10-CM | POA: Diagnosis not present

## 2018-07-18 LAB — COMPREHENSIVE METABOLIC PANEL
ALBUMIN: 4.2 g/dL (ref 3.5–5.2)
ALT: 18 U/L (ref 0–35)
AST: 23 U/L (ref 0–37)
Alkaline Phosphatase: 74 U/L (ref 39–117)
BUN: 21 mg/dL (ref 6–23)
CHLORIDE: 105 meq/L (ref 96–112)
CO2: 26 mEq/L (ref 19–32)
Calcium: 10.3 mg/dL (ref 8.4–10.5)
Creatinine, Ser: 1.02 mg/dL (ref 0.40–1.20)
GFR: 65.33 mL/min (ref >60.00)
Glucose, Bld: 107 mg/dL — ABNORMAL HIGH (ref 70–99)
Potassium: 4.4 mEq/L (ref 3.5–5.1)
Sodium: 140 mEq/L (ref 135–145)
Total Bilirubin: 0.5 mg/dL (ref 0.2–1.2)
Total Protein: 7.5 g/dL (ref 6.0–8.3)

## 2018-07-18 LAB — HEMOGLOBIN A1C: HEMOGLOBIN A1C: 7.5 % — AB (ref 4.6–6.5)

## 2018-07-19 ENCOUNTER — Telehealth: Payer: Self-pay | Admitting: Hematology

## 2018-07-19 ENCOUNTER — Ambulatory Visit: Payer: Medicare Other

## 2018-07-19 ENCOUNTER — Other Ambulatory Visit: Payer: Medicare Other

## 2018-07-19 ENCOUNTER — Ambulatory Visit: Payer: Medicare Other | Admitting: Hematology

## 2018-07-19 NOTE — Telephone Encounter (Signed)
R/s appt per 3/13 sch message - pt aware of new date and time

## 2018-07-22 ENCOUNTER — Other Ambulatory Visit: Payer: Self-pay

## 2018-07-22 ENCOUNTER — Ambulatory Visit (INDEPENDENT_AMBULATORY_CARE_PROVIDER_SITE_OTHER): Payer: Medicare Other | Admitting: Endocrinology

## 2018-07-22 ENCOUNTER — Encounter: Payer: Self-pay | Admitting: Endocrinology

## 2018-07-22 VITALS — BP 104/88 | HR 80 | Temp 98.1°F | Resp 12 | Ht 63.0 in | Wt 223.0 lb

## 2018-07-22 DIAGNOSIS — Z794 Long term (current) use of insulin: Secondary | ICD-10-CM

## 2018-07-22 DIAGNOSIS — R7989 Other specified abnormal findings of blood chemistry: Secondary | ICD-10-CM

## 2018-07-22 DIAGNOSIS — E1165 Type 2 diabetes mellitus with hyperglycemia: Secondary | ICD-10-CM | POA: Diagnosis not present

## 2018-07-22 DIAGNOSIS — B372 Candidiasis of skin and nail: Secondary | ICD-10-CM | POA: Diagnosis not present

## 2018-07-22 DIAGNOSIS — I10 Essential (primary) hypertension: Secondary | ICD-10-CM | POA: Diagnosis not present

## 2018-07-22 MED ORDER — SEMAGLUTIDE (1 MG/DOSE) 2 MG/1.5ML ~~LOC~~ SOPN: 1.0000 mg | PEN_INJECTOR | SUBCUTANEOUS | 3 refills | Status: DC

## 2018-07-22 MED ORDER — BENAZEPRIL-HYDROCHLOROTHIAZIDE 10-12.5 MG PO TABS: 1.0000 | ORAL_TABLET | Freq: Every day | ORAL | 5 refills | Status: DC

## 2018-07-22 MED ORDER — FLUCONAZOLE 100 MG PO TABS: 100.0000 mg | ORAL_TABLET | Freq: Every day | ORAL | 0 refills | Status: DC

## 2018-07-22 MED ORDER — MUPIROCIN 2 % EX OINT: 1.0000 "application " | TOPICAL_OINTMENT | Freq: Two times a day (BID) | CUTANEOUS | 0 refills | Status: DC

## 2018-07-22 NOTE — Progress Notes (Signed)
Patient ID: Megan Fischer, female   DOB: 01-19-1951, 68 y.o.   MRN: [id number]   Reason for Appointment: Follow-up  History of Present Illness   Diagnosis: Type 2 DIABETES MELITUS, date of diagnosis:  2000     Previous history: She has been on various regimens of treatment in the past including Byetta and generally has done better with GLP-1 drugs She continues to take low-dose glimepiride which helps with overall control. Usually blood sugars are not variable Because of occasional hypoglycemia her Amaryl was stopped in 03/2014  Recent history: Non-insulin hypoglycemic drugs: Metformin ER 500 mg, 4 tablets at dinner, Ozempic 0.5 mg weekly  She has been on Victoza since at least 2013 in am  Her A1c is relatively higher at 7.5 and not clear why  Current blood sugar patterns and problems identified:  She did bring her monitor for download with some readings at various times  This showed her blood sugar to be relatively good with only occasional readings in the 170s postprandially  She thinks he has been compliant with all her medications including Ozempic without side effects  Although she is showing some fluctuation in her blood sugars at all times she does not have any consistent patterns and blood sugars are still adequate even after meals on an average  She is not doing any specific exercise and weight appears to be fluctuating based on her edema  She thinks that she is trying to keep her portions controlled  Also lately has been drinking mostly water and avoiding regular soft drinks and may have only some grapefruit juice     Side effects from medications: None  Monitors blood glucose: once or twice a day.    Glucometer:  One Touch    PRE-MEAL Fasting Lunch Dinner Bedtime Overall  Glucose range:  100-159     90-199  Mean/median:  117  110   118   POST-MEAL PC Breakfast PC Lunch PC Dinner  Glucose range:    114-199  Mean/median:   103  141   Previous  readings:  PRE-MEAL  morning Lunch Dinner Bedtime Overall  Glucose range:  93-186   90-166    Mean/median:  129   140  95  117+/-31   POST-MEAL PC Breakfast PC Lunch PC Dinner  Glucose range:   88-219   Mean/median:   109       Wt Readings from Last 3 Encounters:  07/22/18 223 lb (101.2 kg)  04/19/18 231 lb 6.4 oz (105 kg)  02/08/18 223 lb (101.2 kg)   Lab Results  Component Value Date   HGBA1C 7.5 (H) 07/18/2018   HGBA1C 7.3 (H) 04/15/2018   HGBA1C 6.9 (H) 12/06/2017   Lab Results  Component Value Date   MICROALBUR 44.7 (H) 12/06/2017   LDLCALC 69 12/06/2017   CREATININE 1.02 07/18/2018   Other problems discussed today: See review of systems    Lab on 07/18/2018  Component Date Value Ref Range Status  . Sodium 07/18/2018 140  135 - 145 mEq/L Final  . Potassium 07/18/2018 4.4  3.5 - 5.1 mEq/L Final  . Chloride 07/18/2018 105  96 - 112 mEq/L Final  . CO2 07/18/2018 26  19 - 32 mEq/L Final  . Glucose, Bld 07/18/2018 107* 70 - 99 mg/dL Final  . BUN 07/18/2018 21  6 - 23 mg/dL Final  . Creatinine, Ser 07/18/2018 1.02  0.40 - 1.20 mg/dL Final  . Total Bilirubin 07/18/2018 0.5  0.2 - 1.2  mg/dL Final  . Alkaline Phosphatase 07/18/2018 74  39 - 117 U/L Final  . AST 07/18/2018 23  0 - 37 U/L Final  . ALT 07/18/2018 18  0 - 35 U/L Final  . Total Protein 07/18/2018 7.5  6.0 - 8.3 g/dL Final  . Albumin 07/18/2018 4.2  3.5 - 5.2 g/dL Final  . Calcium 07/18/2018 10.3  8.4 - 10.5 mg/dL Final  . GFR 07/18/2018 65.33  >60.00 mL/min Final  . Hgb A1c MFr Bld 07/18/2018 7.5* 4.6 - 6.5 % Final   Glycemic Control Guidelines for People with Diabetes:Non Diabetic:  <6%Goal of Therapy: <7%Additional Action Suggested:  >8%     Allergies as of 07/22/2018      Reactions   Codeine    Oxycodone Hcl    Penicillins    Chlorhexidine Gluconate Rash      Medication List       Accurate as of July 22, 2018  8:18 AM. Always use your most recent med list.         benazepril-hydrochlorthiazide 10-12.5 MG tablet Commonly known as:  LOTENSIN HCT TAKE 1 TABLET BY MOUTH ONCE DAILY   CALCIUM PO Take by mouth 2 (two) times daily.   cloNIDine 0.2 mg/24hr patch Commonly known as:  Catapres-TTS-2 Place 1 patch (0.2 mg total) onto the skin once a week.   diltiazem 360 MG 24 hr capsule Commonly known as:  CARDIZEM CD TAKE 1 CAPSULE BY MOUTH  DAILY   ergocalciferol 1.25 MG (50000 UT) capsule Commonly known as:  VITAMIN D2 Take 50,000 Units by mouth once a week.   ferrous sulfate 325 (65 FE) MG EC tablet Take 1 tablet (325 mg total) by mouth 2 (two) times daily.   Insulin Pen Needle 32G X 4 MM Misc Commonly known as:  BD Pen Needle Nano U/F Use one per day to inject Victoza   Insulin Pen Needle 32G X 4 MM Misc Commonly known as:  BD Pen Needle Nano U/F USE ONE PER DAY TO INJECT  VICTOZA   metFORMIN 500 MG 24 hr tablet Commonly known as:  GLUCOPHAGE-XR TAKE 4 TABLETS BY MOUTH  DAILY WITH SUPPER.   metoprolol succinate 100 MG 24 hr tablet Commonly known as:  TOPROL-XL TAKE 1 TABLET BY MOUTH  DAILY WITH OR IMMEDIATELY  FOLLOWING A MEAL   OneTouch Delica Lancets Fine Misc Use to obtain a blood specimen 2 times per day dx code 250.02   OneTouch Verio IQ System w/Device Kit Use to check blood sugar 2 times per day dx code 250.02   OneTouch Verio test strip Generic drug:  glucose blood USE AS DIRECTED TO CHECK  BLOOD SUGAR TWICE DAILY   Ozempic (0.25 or 0.5 MG/DOSE) 2 MG/1.5ML Sopn Generic drug:  Semaglutide(0.25 or 0.5MG/DOS) INJECT 0.5 MG INTO THE SKIN ONCE A WEEK.   rosuvastatin 10 MG tablet Commonly known as:  CRESTOR TAKE 1 TABLET BY MOUTH  DAILY   warfarin 5 MG tablet Commonly known as:  COUMADIN Take as directed by the anticoagulation clinic. If you are unsure how to take this medication, talk to your nurse or doctor. Original instructions:  TAKE AS DIRECTED BY  COUMADIN CLINIC       Allergies:  Allergies  Allergen  Reactions  . Codeine   . Oxycodone Hcl   . Penicillins   . Chlorhexidine Gluconate Rash    Past Medical History:  Diagnosis Date  . Atrial fibrillation ([place])   . Chronic disease anemia 03/29/2011  .  Diabetes mellitus   . Dyslipidemia   . Hypertension   . Obesities, morbid ([place])   . Osteoarthritis     Past Surgical History:  Procedure Laterality Date  . CHOLECYSTECTOMY    . REPLACEMENT TOTAL KNEE BILATERAL    . SHOULDER SURGERY     RIGHT SHOULDER  . TUBAL LIGATION      Family History  Problem Relation Age of Onset  . Heart attack Mother   . Stroke Mother   . Diabetes Mother   . Hypertension Brother   . Breast cancer Neg Hx     Social History:  reports that she has never smoked. She has never used smokeless tobacco. She reports that she does not drink alcohol or use drugs.  Review of Systems:  History of atrial fibrillation: She is on Coumadin and followed by cardiologist  Hypertension:  she has required multiple medications and blood pressure is usually well controlled She is currently taking diltiazem, metoprolol, Catapres 0.2 patch, Lotensin HCT 10/12.5  Blood pressure is excellent No lightheadedness  Renal function is better recently  BP Readings from Last 3 Encounters:  07/22/18 104/88  04/19/18 120/70  02/08/18 128/80   Lab Results  Component Value Date   CREATININE 1.02 07/18/2018   CREATININE 1.07 (H) 05/17/2018   CREATININE 1.49 (H) 04/15/2018     Lipids:  well controlled with Crestor and LDL is as follows:   Lab Results  Component Value Date   CHOL 165 12/06/2017   HDL 79.40 12/06/2017   LDLCALC 69 12/06/2017   TRIG 80.0 12/06/2017   CHOLHDL 2 12/06/2017    History of goiter, long-standing which is euthyroid, tends to have relatively low TSH  She has normal T3 and free T4 levels but needs follow-up   Lab Results  Component Value Date   T3FREE 3.0 09/03/2017   T3FREE 3.2 09/25/2016      Lab Results  Component Value Date    FREET4 1.07 09/03/2017   FREET4 1.23 09/25/2016   FREET4 1.32 06/29/2016   TSH 0.17 (L) 09/03/2017   TSH 0.21 (L) 09/25/2016   TSH 0.17 (L) 06/29/2016   She has had chronic left lower leg edema  She had a blister on her left finger which sequently left a nonhealing ulcer and she has not been able to get into her PCP for exam She has only been applying Neosporin for this No fever    Examination:   BP 104/88 (BP Location: Left Arm, Patient Position: Sitting, Cuff Size: Large)   Pulse 80   Temp 98.1 F (36.7 C)   Resp 12   Ht 5' 3"  (1.6 m)   Wt 223 lb (101.2 kg)   SpO2 94%   BMI 39.50 kg/m   Body mass index is 39.5 kg/m.   She has a clean ulceration of the distal second left finger with some insulation at the base of the nail and marked whitish layer of exudate around the nail and distal finger.  No redness or swelling of the finger   ASSESSMENT/ PLAN:   Diabetes type 2, has severe obesity on metformin and Victoza maximum doses  See history of present illness for detailed discussion of current diabetes management, blood sugar patterns and problems identified  Her A1c continues to be relatively high at 7.5  This is despite multiple medications including Ozempic She does not appear to have any change in her diet or lifestyle Overall has not gained any weight Her blood sugars have been checked at  various times and only a couple of times have been over 170 postprandially  Since no consistent pattern is seen we will increase her Ozempic to 1 mg which may overall bring her blood sugar readings down before going back to Victoza Encourage her to be as active as possible To continue checking readings after meals Discussed basic diet principles   HYPERTENSION:  Blood pressure is controlled She will continue to use 1/2 tablet of benazepril HCTZ  Ulcer of the left second finger and likely superimposed cutaneous candidiasis without cellulitis: She will begin a course of Diflucan  and use Bactroban locally If she is not better she will need to call her PCP or go to the urgent care center Also will send a message to the pharmacist monitoring her Coumadin to check her INR again this week in case there is a interaction although this is relatively minor with low-dose Diflucan  Follow-up in 3 months  History of autonomous thyroid function: We will recheck levels on the next visit  Total visit time for evaluation and management of multiple problems and counseling =25 minutes  Elayne Snare 07/22/2018, 8:18 AM   Note: This office note was prepared with Dragon voice recognition system technology. Any transcriptional errors that result from this process are unintentional.

## 2018-07-25 ENCOUNTER — Ambulatory Visit: Payer: Medicare Other | Admitting: Hematology

## 2018-07-25 ENCOUNTER — Ambulatory Visit: Payer: Medicare Other

## 2018-07-25 ENCOUNTER — Other Ambulatory Visit: Payer: Medicare Other

## 2018-07-25 ENCOUNTER — Ambulatory Visit (INDEPENDENT_AMBULATORY_CARE_PROVIDER_SITE_OTHER): Payer: Medicare Other | Admitting: Pharmacist

## 2018-07-25 ENCOUNTER — Other Ambulatory Visit: Payer: Self-pay

## 2018-07-25 DIAGNOSIS — I4891 Unspecified atrial fibrillation: Secondary | ICD-10-CM | POA: Diagnosis not present

## 2018-07-25 DIAGNOSIS — Z5181 Encounter for therapeutic drug level monitoring: Secondary | ICD-10-CM

## 2018-07-25 LAB — POCT INR: INR: 3.2 — AB (ref 2.0–3.0)

## 2018-07-25 MED ORDER — APIXABAN 5 MG PO TABS: 5.0000 mg | ORAL_TABLET | Freq: Two times a day (BID) | ORAL | 1 refills | Status: DC

## 2018-07-25 NOTE — Progress Notes (Signed)
Pt was started on Eliquis for afib on 07/27/2018.  Reviewed patients medication list.  Pt is not currently on any combined P-gp and strong CYP3A4 inhibitors/inducers (ketoconazole, traconazole, ritonavir, carbamazepine, phenytoin, rifampin, St. John's wort).  Reviewed labs.  SCr 1.02, Weight 101.2kg, .  Dose appropriate based on age, weight, and SCr.  Hgb and HCT Within Normal Limits  A full discussion of the nature of anticoagulants has been carried out.  A benefit/risk analysis has been presented to the patient, so that they understand the justification for choosing anticoagulation with Eliquis at this time.  The need for compliance is stressed.  Pt is aware to take the medication twice daily.  Side effects of potential bleeding are discussed, including unusual colored urine or stools, coughing up blood or coffee ground emesis, nose bleeds or serious fall or head trauma.  Discussed signs and symptoms of stroke. The patient should avoid any OTC items containing aspirin or ibuprofen.  Avoid alcohol consumption.   Call if any signs of abnormal bleeding.  Discussed financial obligations and resolved any difficulty in obtaining medication.  Next lab test in 3 months.

## 2018-07-25 NOTE — Patient Instructions (Signed)
Do not take any more coumadin. Start taking Eliquis 5mg  twice a day starting Saturday morning.

## 2018-08-08 NOTE — Progress Notes (Signed)
[facility]   [phone] [phone]   Clinic Follow up Note   Patient Care Team: [name], MD as PCP - General (Internal Medicine) Nahser, Wonda Cheng, MD as PCP - Cardiology (Cardiology)   I connected with Megan Fischer on 08/09/2018 at  2:45 PM EDT by telephone and verified that I am speaking with the correct person using two identifiers.   I discussed the limitations, risks, security and privacy concerns of performing an evaluation and management service by telephone and the availability of in person appointments. I also discussed with the patient that there may be a patient responsible charge related to this service. The patient expressed understanding and agreed to proceed.    CHIEF COMPLAINT: Anemia of chronic disease    PRIOR THERAPY: #1 patient has received Aranesp injections every 4-8 weeks her last injection was given on 09/21/2016   #2 patient received IV Feraheme on 07/28/16 and 08/04/16, 09/28/2017 and 10/12/2017  CURRENT THERAPY:Aranesp and iv iron as needed   INTERVAL HISTORY:  Megan Fischer is here for a follow up of anemia. She was able to identify herself by birth date. She was last seen by me 10 months ago. She notes she is doing well, stable and at baseline. I reviewed her medication list with her. She is no longer on coumadin, she is now on Eliquis. She no longer takes Victoza and now takes Ozempic. She sees Dr. Dwyane Dee in May her PCP in June.    REVIEW OF SYSTEMS:   Constitutional: Denies fevers, chills or abnormal weight loss Eyes: Denies blurriness of vision Ears, nose, mouth, throat, and face: Denies mucositis or sore throat Respiratory: Denies cough, dyspnea or wheezes Cardiovascular: Denies palpitation, chest discomfort or lower extremity swelling Gastrointestinal:  Denies nausea, heartburn or change in bowel habits Skin: Denies abnormal skin rashes Lymphatics: Denies new lymphadenopathy or easy bruising  Neurological:Denies numbness, tingling or new weaknesses Behavioral/Psych: Mood is stable, no new changes  All other systems were reviewed with the patient and are negative.  MEDICAL HISTORY:  Past Medical History:  Diagnosis Date  . Atrial fibrillation ([place])   . Chronic disease anemia 03/29/2011  . Diabetes mellitus   . Dyslipidemia   . Hypertension   . Obesities, morbid ([place])   . Osteoarthritis     SURGICAL HISTORY: Past Surgical History:  Procedure Laterality Date  . CHOLECYSTECTOMY    . REPLACEMENT TOTAL KNEE BILATERAL    . SHOULDER SURGERY     RIGHT SHOULDER  . TUBAL LIGATION      I have reviewed the social history and family history with the patient and they are unchanged from previous note.  ALLERGIES:  is allergic to codeine; oxycodone hcl; penicillins; and chlorhexidine gluconate.  MEDICATIONS:  Current Outpatient Medications  Medication Sig Dispense Refill  . apixaban (ELIQUIS) 5 MG TABS tablet Take 1 tablet (5 mg total) by mouth 2 (two) times daily. 180 tablet 1  . benazepril-hydrochlorthiazide (LOTENSIN HCT) 10-12.5 MG tablet Take 1 tablet by mouth daily. 30 tablet 5  . Blood Glucose Monitoring Suppl (ONETOUCH VERIO IQ SYSTEM) W/DEVICE KIT Use to check blood sugar 2 times per day dx code 250.02 1 kit 0  . CALCIUM PO Take by mouth 2 (two) times daily.      . cloNIDine (CATAPRES-TTS-2) 0.2 mg/24hr patch Place 1 patch (0.2 mg total) onto the skin once a week. 4 patch 2  . diltiazem (CARDIZEM CD) 360 MG 24 hr capsule TAKE 1 CAPSULE BY  MOUTH  DAILY 90 capsule 1  . ergocalciferol (VITAMIN D2) 50000 UNITS capsule Take 50,000 Units by mouth once a week.      . ferrous sulfate 325 (65 FE) MG EC tablet Take 1 tablet (325 mg total) by mouth 2 (two) times daily. 60 tablet 3  . fluconazole (DIFLUCAN) 100 MG tablet Take 1 tablet (100 mg total) by mouth daily. 4 tablet 0  . Insulin Pen Needle (BD PEN NEEDLE NANO U/F) 32G X 4 MM MISC Use one per day to inject Victoza 90 each 1   . Insulin Pen Needle (BD PEN NEEDLE NANO U/F) 32G X 4 MM MISC USE ONE PER DAY TO INJECT  VICTOZA 90 each 3  . metFORMIN (GLUCOPHAGE-XR) 500 MG 24 hr tablet TAKE 4 TABLETS BY MOUTH  DAILY WITH SUPPER. 360 tablet 3  . metoprolol succinate (TOPROL-XL) 100 MG 24 hr tablet TAKE 1 TABLET BY MOUTH  DAILY WITH OR IMMEDIATELY  FOLLOWING A MEAL 90 tablet 3  . mupirocin ointment (BACTROBAN) 2 % Place 1 application into the nose 2 (two) times daily. 22 g 0  . ONETOUCH DELICA LANCETS FINE MISC Use to obtain a blood specimen 2 times per day dx code 250.02 200 each 0  . ONETOUCH VERIO test strip USE AS DIRECTED TO CHECK  BLOOD SUGAR TWICE DAILY 200 each 1  . rosuvastatin (CRESTOR) 10 MG tablet TAKE 1 TABLET BY MOUTH  DAILY 90 tablet 2  . Semaglutide, 1 MG/DOSE, (OZEMPIC, 1 MG/DOSE,) 2 MG/1.5ML SOPN Inject 1 mg into the skin once a week. 2 pen 3   No current facility-administered medications for this visit.     PHYSICAL EXAMINATION: ECOG PERFORMANCE STATUS: 1 - Symptomatic but completely ambulatory  Exam not performed today   LABORATORY DATA:  I have reviewed the data as listed CBC Latest Ref Rng & Units 05/17/2018 05/17/2018 01/18/2018  WBC 4.0 - 10.5 K/uL 6.0 - 5.7  Hemoglobin 12.0 - 15.0 g/dL 13.3 - 14.3  Hematocrit 36.0 - 46.0 % 41.2 38.6 43.5  Platelets 150 - 400 K/uL 233 - 153     CMP Latest Ref Rng & Units 07/18/2018 05/17/2018 04/15/2018  Glucose 70 - 99 mg/dL 107(H) 111(H) 136(H)  BUN 6 - 23 mg/dL 21 19 49(H)  Creatinine 0.40 - 1.20 mg/dL 1.02 1.07(H) 1.49(H)  Sodium 135 - 145 mEq/L 140 143 138  Potassium 3.5 - 5.1 mEq/L 4.4 4.0 4.2  Chloride 96 - 112 mEq/L 105 105 102  CO2 19 - 32 mEq/L _0 Calcium 8.4 - 10.5 mg/dL 10.3 10.6(H) 10.2  Total Protein 6.0 - 8.3 g/dL 7.5 7.9 -  Total Bilirubin 0.2 - 1.2 mg/dL 0.5 0.6 -  Alkaline Phos 39 - 117 U/L 74 89 -  AST 0 - 37 U/L 23 31 -  ALT 0 - 35 U/L 18 28 -      RADIOGRAPHIC STUDIES: I have personally reviewed the radiological images as  listed and agreed with the findings in the report. No results found.   ASSESSMENT & PLAN:  Megan Fischer is a 68 y.o. female with   1. Anemia of chronic disease and iron deficiency  -Her initial workup showed normal ferritin and serum iron level, slightly low iron saturation, normal B86 and folic acid. Normal erythropoietin level. SPEP was negative.  -Given her multiple comorbidities, especially diabetes, this is likely anemia of chronic disease. -Low risk is also a possibility. Given her stable mild anemia, no other cytopenia, I'll hold  bone marrow biopsy for now. We discussed she may need a bone marrow biopsy if her anemia gets much worse, or if she develops other cytopenia. -She responded well to Aranesp injection, and has not required any treatment since 2018, -She has restart Aranesp injection 178mg every 4-8 weeks on 08/04/16. She also received IV Feraheme in May and June 2019. She responded very well.  -Last IV feraheme in 10/2017 and last Aranesp injection in 09/2017. Labs since then have not indicated the need for treatment.  -She takes oral iron daily, will continue  -Latest labs from 05/17/18 show CBC, CMP and iron penal WNL except BG 111, Cr 1.07, Ca 10.6.  -F/u with me in 5-6 months. Continue to follow up with PCP and Dr. KDwyane Deein the interim.  -She will contact the clinic if she develops concerning symptoms or drop in blood counts indicating anemia.    2. Diabetes, hypertension, A. fib on Coumadin, morbid RBC -She will continue follow-up with her primary care physician, endocrinologist and cardiologist. -Controlled   Plan . -Lab and f/u in 5-6 months.  -Continue oral iron    No problem-specific Assessment & Plan notes found for this encounter.   No orders of the defined types were placed in this encounter.  All questions were answered. The patient knows to call the clinic with any problems, questions or concerns. No barriers to learning was detected. I spent 5 minutes  counseling the patient over the phone. The total time spent in the appointment was 10 minutes and more than 50% was on counseling and review of test results     YTruitt Merle MD 08/09/2018   I, AJoslyn Devon am acting as scribe for YTruitt Merle MD.   I have reviewed the above documentation for accuracy and completeness, and I agree with the above.

## 2018-08-09 ENCOUNTER — Ambulatory Visit: Payer: Medicare Other

## 2018-08-09 ENCOUNTER — Encounter: Payer: Self-pay | Admitting: Hematology

## 2018-08-09 ENCOUNTER — Inpatient Hospital Stay: Payer: Medicare Other | Attending: Hematology | Admitting: Hematology

## 2018-08-09 ENCOUNTER — Other Ambulatory Visit: Payer: Medicare Other

## 2018-08-09 DIAGNOSIS — I10 Essential (primary) hypertension: Secondary | ICD-10-CM

## 2018-08-09 DIAGNOSIS — D638 Anemia in other chronic diseases classified elsewhere: Secondary | ICD-10-CM

## 2018-08-09 DIAGNOSIS — Z7901 Long term (current) use of anticoagulants: Secondary | ICD-10-CM

## 2018-08-09 DIAGNOSIS — I482 Chronic atrial fibrillation, unspecified: Secondary | ICD-10-CM

## 2018-08-09 DIAGNOSIS — E119 Type 2 diabetes mellitus without complications: Secondary | ICD-10-CM | POA: Diagnosis not present

## 2018-08-12 ENCOUNTER — Telehealth: Payer: Self-pay | Admitting: Hematology

## 2018-08-12 NOTE — Telephone Encounter (Signed)
Scheduled appt per 4/3 los.  I called the patient and stated who I was and she responded back I am not calling from the doctors office and then hung up. When I called back the second time, no response and I was not able to leave a voice message.

## 2018-09-03 ENCOUNTER — Other Ambulatory Visit: Payer: Self-pay | Admitting: Endocrinology

## 2018-09-10 ENCOUNTER — Other Ambulatory Visit: Payer: Self-pay

## 2018-09-10 MED ORDER — BENAZEPRIL-HYDROCHLOROTHIAZIDE 10-12.5 MG PO TABS: 1.0000 | ORAL_TABLET | Freq: Every day | ORAL | 0 refills | Status: DC

## 2018-09-12 ENCOUNTER — Other Ambulatory Visit: Payer: Self-pay

## 2018-09-12 ENCOUNTER — Other Ambulatory Visit (INDEPENDENT_AMBULATORY_CARE_PROVIDER_SITE_OTHER): Payer: Medicare Other

## 2018-09-12 DIAGNOSIS — E1165 Type 2 diabetes mellitus with hyperglycemia: Secondary | ICD-10-CM

## 2018-09-12 DIAGNOSIS — R7989 Other specified abnormal findings of blood chemistry: Secondary | ICD-10-CM

## 2018-09-12 DIAGNOSIS — Z794 Long term (current) use of insulin: Secondary | ICD-10-CM | POA: Diagnosis not present

## 2018-09-12 LAB — COMPREHENSIVE METABOLIC PANEL
ALT: 19 U/L (ref 0–35)
AST: 22 U/L (ref 0–37)
Albumin: 4.2 g/dL (ref 3.5–5.2)
Alkaline Phosphatase: 71 U/L (ref 39–117)
BUN: 29 mg/dL — ABNORMAL HIGH (ref 6–23)
CO2: 27 mEq/L (ref 19–32)
Calcium: 9.6 mg/dL (ref 8.4–10.5)
Chloride: 104 mEq/L (ref 96–112)
Creatinine, Ser: 1.1 mg/dL (ref 0.40–1.20)
GFR: 59.85 mL/min — ABNORMAL LOW (ref >60.00)
Glucose, Bld: 143 mg/dL — ABNORMAL HIGH (ref 70–99)
Potassium: 4 mEq/L (ref 3.5–5.1)
Sodium: 141 mEq/L (ref 135–145)
Total Bilirubin: 0.5 mg/dL (ref 0.2–1.2)
Total Protein: 7.6 g/dL (ref 6.0–8.3)

## 2018-09-12 LAB — LIPID PANEL
Cholesterol: 162 mg/dL (ref 0–200)
HDL: 73.5 mg/dL (ref >39.00)
LDL Cholesterol: 74 mg/dL (ref 0–99)
NonHDL: 88.48
Total CHOL/HDL Ratio: 2
Triglycerides: 71 mg/dL (ref 0.0–149.0)
VLDL: 14.2 mg/dL (ref 0.0–40.0)

## 2018-09-12 LAB — MICROALBUMIN / CREATININE URINE RATIO
Creatinine,U: 227.7 mg/dL
Microalb Creat Ratio: 4.5 mg/g (ref 0.0–30.0)
Microalb, Ur: 10.2 mg/dL — ABNORMAL HIGH (ref 0.0–1.9)

## 2018-09-12 LAB — T3, FREE: T3, Free: 2.8 pg/mL (ref 2.3–4.2)

## 2018-09-12 LAB — HEMOGLOBIN A1C: Hgb A1c MFr Bld: 7.1 % — ABNORMAL HIGH (ref 4.6–6.5)

## 2018-09-12 LAB — TSH: TSH: 0.29 u[IU]/mL — ABNORMAL LOW (ref 0.35–4.50)

## 2018-09-12 LAB — T4, FREE: Free T4: 1.15 ng/dL (ref 0.60–1.60)

## 2018-09-16 NOTE — Progress Notes (Signed)
Patient ID: Megan Fischer, female   DOB: August 07, 1950, 68 y.o.   MRN: [id number]   Today's office visit was provided via telemedicine using [employer] duo video technique Explained to the patient and the the limitations of evaluation and management by telemedicine and the availability of in person appointments.  The patient understood the limitations and agreed to proceed. Patient also understood that the telehealth visit is billable. . Location of the patient: Home . Location of the provider: Office Only the patient and myself were participating in the encounter    Reason for Appointment: Follow-up  History of Present Illness   Diagnosis: Type 2 DIABETES MELITUS, date of diagnosis:  2000     Previous history: She has been on various regimens of treatment in the past including Byetta and generally has done better with GLP-1 drugs She continues to take low-dose glimepiride which helps with overall control. Usually blood sugars are not variable Because of occasional hypoglycemia her Amaryl was stopped in 03/2014 Was on Victoza since 2013 until 2019  Recent history: Non-insulin hypoglycemic drugs: Metformin ER 500 mg, 4 tablets at dinner, Ozempic 0.5 mg weekly  Her A1c is back down to 7.1  Current blood sugar patterns and problems identified:  Her Ozempic has been increased to 1 mg since 3/20 because of higher A1c  Again compared to her last visit no consistent pattern is seen  Also average blood sugar at home is about the same as before  Currently not doing any formal exercise except taking care of her grandchildren  She thinks are increasing the Ozempic has improved her satiety  Not clear if she has lost any weight     Side effects from medications: None  Monitors blood glucose: once or twice a day.    Glucometer:  One Touch    PRE-MEAL Fasting Lunch Dinner Bedtime Overall  Glucose range:  104-118   120-152    Mean/median:      122   POST-MEAL PC Breakfast PC  Lunch PC Dinner  Glucose range:  113  99  104-192  Mean/median:       Previous readings:  PRE-MEAL Fasting Lunch Dinner Bedtime Overall  Glucose range:  100-159     90-199  Mean/median:  117  110   118   POST-MEAL PC Breakfast PC Lunch PC Dinner  Glucose range:    114-199  Mean/median:   103  141     Wt Readings from Last 3 Encounters:  07/22/18 223 lb (101.2 kg)  04/19/18 231 lb 6.4 oz (105 kg)  02/08/18 223 lb (101.2 kg)   Lab Results  Component Value Date   HGBA1C 7.1 (H) 09/12/2018   HGBA1C 7.5 (H) 07/18/2018   HGBA1C 7.3 (H) 04/15/2018   Lab Results  Component Value Date   MICROALBUR 10.2 (H) 09/12/2018   LDLCALC 74 09/12/2018   CREATININE 1.10 09/12/2018   Other problems discussed today: See review of systems    Lab on 09/12/2018  Component Date Value Ref Range Status  . Cholesterol 09/12/2018 162  0 - 200 mg/dL Final   ATP III Classification       Desirable:  < 200 mg/dL               Borderline High:  200 - 239 mg/dL          High:  > = 240 mg/dL  . Triglycerides 09/12/2018 71.0  0.0 - 149.0 mg/dL Final   Normal:  <150 mg/dLBorderline High:  150 - 199 mg/dL  . HDL 09/12/2018 73.50  >39.00 mg/dL Final  . VLDL 09/12/2018 14.2  0.0 - 40.0 mg/dL Final  . LDL Cholesterol 09/12/2018 74  0 - 99 mg/dL Final  . Total CHOL/HDL Ratio 09/12/2018 2   Final                  Men          Women1/2 Average Risk     3.4          3.3Average Risk          5.0          4.42X Average Risk          9.6          7.13X Average Risk          15.0          11.0                      . NonHDL 09/12/2018 88.48   Final   NOTE:  Non-HDL goal should be 30 mg/dL higher than patient's LDL goal (i.e. LDL goal of < 70 mg/dL, would have non-HDL goal of < 100 mg/dL)  . T3, Free 09/12/2018 2.8  2.3 - 4.2 pg/mL Final  . Free T4 09/12/2018 1.15  0.60 - 1.60 ng/dL Final   Comment: Specimens from patients who are undergoing biotin therapy and /or ingesting biotin supplements may contain high levels  of biotin.  The higher biotin concentration in these specimens interferes with this Free T4 assay.  Specimens that contain high levels  of biotin may cause false high results for this Free T4 assay.  Please interpret results in light of the total clinical presentation of the patient.    Marland Kitchen TSH 09/12/2018 0.29* 0.35 - 4.50 uIU/mL Final  . Sodium 09/12/2018 141  135 - 145 mEq/L Final  . Potassium 09/12/2018 4.0  3.5 - 5.1 mEq/L Final  . Chloride 09/12/2018 104  96 - 112 mEq/L Final  . CO2 09/12/2018 27  19 - 32 mEq/L Final  . Glucose, Bld 09/12/2018 143* 70 - 99 mg/dL Final  . BUN 09/12/2018 29* 6 - 23 mg/dL Final  . Creatinine, Ser 09/12/2018 1.10  0.40 - 1.20 mg/dL Final  . Total Bilirubin 09/12/2018 0.5  0.2 - 1.2 mg/dL Final  . Alkaline Phosphatase 09/12/2018 71  39 - 117 U/L Final  . AST 09/12/2018 22  0 - 37 U/L Final  . ALT 09/12/2018 19  0 - 35 U/L Final  . Total Protein 09/12/2018 7.6  6.0 - 8.3 g/dL Final  . Albumin 09/12/2018 4.2  3.5 - 5.2 g/dL Final  . Calcium 09/12/2018 9.6  8.4 - 10.5 mg/dL Final  . GFR 09/12/2018 59.85* >60.00 mL/min Final  . Hgb A1c MFr Bld 09/12/2018 7.1* 4.6 - 6.5 % Final   Glycemic Control Guidelines for People with Diabetes:Non Diabetic:  <6%Goal of Therapy: <7%Additional Action Suggested:  >8%   . Microalb, Ur 09/12/2018 10.2* 0.0 - 1.9 mg/dL Final  . Creatinine,U 09/12/2018 227.7  mg/dL Final  . Microalb Creat Ratio 09/12/2018 4.5  0.0 - 30.0 mg/g Final    Allergies as of 09/17/2018      Reactions   Codeine    Oxycodone Hcl    Penicillins    Chlorhexidine Gluconate Rash      Medication List       Accurate as of Sep 16, 2018  9:27 PM. If you have any questions, ask your nurse or doctor.        STOP taking these medications   fluconazole 100 MG tablet Commonly known as:  Diflucan     TAKE these medications   apixaban 5 MG Tabs tablet Commonly known as:  ELIQUIS Take 1 tablet (5 mg total) by mouth 2 (two) times daily.    benazepril-hydrochlorthiazide 10-12.5 MG tablet Commonly known as:  LOTENSIN HCT Take 1 tablet by mouth daily.   CALCIUM PO Take by mouth 2 (two) times daily.   cloNIDine 0.2 mg/24hr patch Commonly known as:  Catapres-TTS-2 Place 1 patch (0.2 mg total) onto the skin once a week.   diltiazem 360 MG 24 hr capsule Commonly known as:  CARDIZEM CD TAKE 1 CAPSULE BY MOUTH  DAILY   ergocalciferol 1.25 MG (50000 UT) capsule Commonly known as:  VITAMIN D2 Take 50,000 Units by mouth once a week.   ferrous sulfate 325 (65 FE) MG EC tablet Take 1 tablet (325 mg total) by mouth 2 (two) times daily.   Insulin Pen Needle 32G X 4 MM Misc Commonly known as:  BD Pen Needle Nano U/F Use one per day to inject Victoza   Insulin Pen Needle 32G X 4 MM Misc Commonly known as:  BD Pen Needle Nano U/F USE ONE PER DAY TO INJECT  VICTOZA   metFORMIN 500 MG 24 hr tablet Commonly known as:  GLUCOPHAGE-XR TAKE 4 TABLETS BY MOUTH  DAILY WITH SUPPER.   metoprolol succinate 100 MG 24 hr tablet Commonly known as:  TOPROL-XL TAKE 1 TABLET BY MOUTH  DAILY WITH OR IMMEDIATELY  FOLLOWING A MEAL   mupirocin ointment 2 % Commonly known as:  BACTROBAN Place 1 application into the nose 2 (two) times daily.   OneTouch Delica Lancets Fine Misc Use to obtain a blood specimen 2 times per day dx code 250.02   OneTouch Verio IQ System w/Device Kit Use to check blood sugar 2 times per day dx code 250.02   OneTouch Verio test strip Generic drug:  glucose blood USE AS DIRECTED TO CHECK  BLOOD SUGAR 2 TIMES DAILY   rosuvastatin 10 MG tablet Commonly known as:  CRESTOR TAKE 1 TABLET BY MOUTH  DAILY   Semaglutide (1 MG/DOSE) 2 MG/1.5ML Sopn Commonly known as:  Ozempic (1 MG/DOSE) Inject 1 mg into the skin once a week.       Allergies:  Allergies  Allergen Reactions  . Codeine   . Oxycodone Hcl   . Penicillins   . Chlorhexidine Gluconate Rash    Past Medical History:  Diagnosis Date  . Atrial  fibrillation ([place])   . Chronic disease anemia 03/29/2011  . Diabetes mellitus   . Dyslipidemia   . Hypertension   . Obesities, morbid ([place])   . Osteoarthritis     Past Surgical History:  Procedure Laterality Date  . CHOLECYSTECTOMY    . REPLACEMENT TOTAL KNEE BILATERAL    . SHOULDER SURGERY     RIGHT SHOULDER  . TUBAL LIGATION      Family History  Problem Relation Age of Onset  . Heart attack Mother   . Stroke Mother   . Diabetes Mother   . Hypertension Brother   . Breast cancer Neg Hx     Social History:  reports that she has never smoked. She has never used smokeless tobacco. She reports that she does not drink alcohol or use drugs.  Review of Systems:  History of  atrial fibrillation: She is on Coumadin and followed by cardiologist  Hypertension:  she has required multiple medications and blood pressure is usually well controlled She is currently taking diltiazem, metoprolol, Catapres 0.2 patch, Lotensin HCT 10/12.5 Her benazepril HCTZ was reduced to half tablet in March 2020  She does not monitor her blood pressure at home  Renal function is consistently normal as below Microalbumin is normal as of 5/20  BP Readings from Last 3 Encounters:  07/22/18 104/88  04/19/18 120/70  02/08/18 128/80   Lab Results  Component Value Date   CREATININE 1.10 09/12/2018   CREATININE 1.02 07/18/2018   CREATININE 1.07 (H) 05/17/2018     Lipids:  well controlled with Crestor and LDL is as follows:   Lab Results  Component Value Date   CHOL 162 09/12/2018   HDL 73.50 09/12/2018   LDLCALC 74 09/12/2018   TRIG 71.0 09/12/2018   CHOLHDL 2 09/12/2018    History of almost multinodular goiter TSH is generally below normal but nearly normal on this visit  She has normal T3 and free T4 levels consistently   Lab Results  Component Value Date   T3FREE 2.8 09/12/2018   T3FREE 3.0 09/03/2017   T3FREE 3.2 09/25/2016      Lab Results  Component Value Date   FREET4  1.15 09/12/2018   FREET4 1.07 09/03/2017   FREET4 1.23 09/25/2016   TSH 0.29 (L) 09/12/2018   TSH 0.17 (L) 09/03/2017   TSH 0.21 (L) 09/25/2016   She has had chronic left lower leg edema    Examination:   There were no vitals taken for this visit.  There is no height or weight on file to calculate BMI.   She has a clean ulceration of the distal second left finger with some insulation at the base of the nail and marked whitish layer of exudate around the nail and distal finger.  No redness or swelling of the finger   ASSESSMENT/ PLAN:   Diabetes type 2, with morbid obesity on metformin and Ozempic maximum doses  See history of present illness for detailed discussion of current diabetes management, blood sugar patterns and problems identified  Her A1c is improved at 7.1  Likely benefiting from Ozempic 1 mg compared to her previous dose of 0.5 No side effects like nausea or vomiting with this She does have satiety with this but not clear if she is losing weight Blood sugar patterns look good with only 1 recent high reading of 192 and most of her sugars are near target  Encouraged her to continue sugars at different times by rotation including after meals Needs to continue walking and other activities Follow-up in 3 months   HYPERTENSION:  Blood pressure is usually controlled but she is not monitoring currently she says she will be getting a blood pressure monitor ordered and encouraged her to let us know if blood pressures not consistently controlled  She will continue to use 1/2 tablet of benazepril HCTZ  History of autonomous thyroid function: Thyroid levels are near normal with TSH not as low No local pressure symptoms from her goiter  LIPIDS: Well controlled with 10 mg rosuvastatin She will continue this dose   Elayne Snare 09/16/2018, 9:27 PM   Note: This office note was prepared with Dragon voice recognition system technology. Any transcriptional errors that result  from this process are unintentional.

## 2018-09-17 ENCOUNTER — Ambulatory Visit (INDEPENDENT_AMBULATORY_CARE_PROVIDER_SITE_OTHER): Payer: Medicare Other | Admitting: Endocrinology

## 2018-09-17 ENCOUNTER — Other Ambulatory Visit: Payer: Self-pay

## 2018-09-17 ENCOUNTER — Encounter: Payer: Self-pay | Admitting: Endocrinology

## 2018-09-17 DIAGNOSIS — R7989 Other specified abnormal findings of blood chemistry: Secondary | ICD-10-CM

## 2018-09-17 DIAGNOSIS — E7800 Pure hypercholesterolemia, unspecified: Secondary | ICD-10-CM | POA: Diagnosis not present

## 2018-09-17 DIAGNOSIS — E1165 Type 2 diabetes mellitus with hyperglycemia: Secondary | ICD-10-CM

## 2018-10-09 ENCOUNTER — Other Ambulatory Visit: Payer: Self-pay | Admitting: Pharmacist

## 2018-10-09 MED ORDER — APIXABAN 5 MG PO TABS: 5.0000 mg | ORAL_TABLET | Freq: Two times a day (BID) | ORAL | 1 refills | Status: DC

## 2018-10-10 ENCOUNTER — Other Ambulatory Visit: Payer: Self-pay

## 2018-10-10 MED ORDER — SEMAGLUTIDE (1 MG/DOSE) 2 MG/1.5ML ~~LOC~~ SOPN: 1.0000 mg | PEN_INJECTOR | SUBCUTANEOUS | 1 refills | Status: DC

## 2018-11-27 ENCOUNTER — Other Ambulatory Visit: Payer: Self-pay | Admitting: Endocrinology

## 2018-12-03 ENCOUNTER — Other Ambulatory Visit: Payer: Self-pay

## 2018-12-03 MED ORDER — BENAZEPRIL-HYDROCHLOROTHIAZIDE 10-12.5 MG PO TABS: 1.0000 | ORAL_TABLET | Freq: Every day | ORAL | 1 refills | Status: DC

## 2018-12-16 ENCOUNTER — Other Ambulatory Visit: Payer: Medicare Other

## 2018-12-19 ENCOUNTER — Ambulatory Visit: Payer: Medicare Other | Admitting: Endocrinology

## 2018-12-19 ENCOUNTER — Other Ambulatory Visit: Payer: Self-pay | Admitting: Internal Medicine

## 2018-12-19 DIAGNOSIS — M5416 Radiculopathy, lumbar region: Secondary | ICD-10-CM

## 2019-01-14 ENCOUNTER — Other Ambulatory Visit: Payer: Self-pay

## 2019-01-14 ENCOUNTER — Other Ambulatory Visit (INDEPENDENT_AMBULATORY_CARE_PROVIDER_SITE_OTHER): Payer: Medicare Other

## 2019-01-14 DIAGNOSIS — E1165 Type 2 diabetes mellitus with hyperglycemia: Secondary | ICD-10-CM

## 2019-01-14 LAB — BASIC METABOLIC PANEL
BUN: 30 mg/dL — ABNORMAL HIGH (ref 6–23)
CO2: 28 mEq/L (ref 19–32)
Calcium: 9.4 mg/dL (ref 8.4–10.5)
Chloride: 108 mEq/L (ref 96–112)
Creatinine, Ser: 1.09 mg/dL (ref 0.40–1.20)
GFR: 60.43 mL/min (ref >60.00)
Glucose, Bld: 99 mg/dL (ref 70–99)
Potassium: 3.8 mEq/L (ref 3.5–5.1)
Sodium: 144 mEq/L (ref 135–145)

## 2019-01-14 LAB — HEMOGLOBIN A1C: Hgb A1c MFr Bld: 7 % — ABNORMAL HIGH (ref 4.6–6.5)

## 2019-01-16 ENCOUNTER — Ambulatory Visit (INDEPENDENT_AMBULATORY_CARE_PROVIDER_SITE_OTHER): Payer: Medicare Other | Admitting: Endocrinology

## 2019-01-16 ENCOUNTER — Encounter: Payer: Self-pay | Admitting: Endocrinology

## 2019-01-16 ENCOUNTER — Other Ambulatory Visit: Payer: Self-pay

## 2019-01-16 VITALS — BP 114/80 | HR 72 | Ht 63.0 in | Wt 219.4 lb

## 2019-01-16 DIAGNOSIS — E042 Nontoxic multinodular goiter: Secondary | ICD-10-CM | POA: Diagnosis not present

## 2019-01-16 DIAGNOSIS — E1165 Type 2 diabetes mellitus with hyperglycemia: Secondary | ICD-10-CM | POA: Diagnosis not present

## 2019-01-16 NOTE — Progress Notes (Signed)
Patient ID: Megan Fischer, female   DOB: [birth year], 68 y.o.   MRN: [id number]    Reason for Appointment: Follow-up  History of Present Illness   Diagnosis: Type 2 DIABETES MELITUS, date of diagnosis:  2000     Previous history: She has been on various regimens of treatment in the past including Byetta and generally has done better with GLP-1 drugs She continues to take low-dose glimepiride which helps with overall control. Usually blood sugars are not variable Because of occasional hypoglycemia her Amaryl was stopped in 03/2014 Was on Victoza since 2013 until 2019  Recent history: Non-insulin hypoglycemic drugs: Metformin ER 500 mg, 4 tablets at dinner, Ozempic 1.0 mg weekly    Current blood sugar patterns and problems identified:  Her A1c is stable at 7 compared to 7.1  She again has lost some weight although her last visit was much well  She thinks she is generally active taking care of her family  Also portions are well controlled especially with increasing her Ozempic since March  She does check her sugars somewhat infrequently and has less than 1 reading per day  Blood sugar RANGE = 78-216 but only 1 significantly high reading  Highest reading was only because of eating a candy bar  She has no side effects from the higher dose of Ozempic or metformin     Side effects from medications: None  Monitors blood glucose: once or twice a day.    Glucometer:  One Touch   AVERAGE 1 14 for the last 30 days  Previous readings:  PRE-MEAL Fasting Lunch Dinner Bedtime Overall  Glucose range:  104-118   120-152    Mean/median:      122   POST-MEAL PC Breakfast PC Lunch PC Dinner  Glucose range:  113  99  104-192  Mean/median:         Wt Readings from Last 3 Encounters:  01/16/19 219 lb 6.4 oz (99.5 kg)  07/22/18 223 lb (101.2 kg)  04/19/18 231 lb 6.4 oz (105 kg)   Lab Results  Component Value Date   HGBA1C 7.0 (H) 01/14/2019   HGBA1C 7.1 (H)  09/12/2018   HGBA1C 7.5 (H) 07/18/2018   Lab Results  Component Value Date   MICROALBUR 10.2 (H) 09/12/2018   LDLCALC 74 09/12/2018   CREATININE 1.09 01/14/2019   Other problems discussed today: See review of systems    Lab on 01/14/2019  Component Date Value Ref Range Status  . Sodium 01/14/2019 144  135 - 145 mEq/L Final  . Potassium 01/14/2019 3.8  3.5 - 5.1 mEq/L Final  . Chloride 01/14/2019 108  96 - 112 mEq/L Final  . CO2 01/14/2019 28  19 - 32 mEq/L Final  . Glucose, Bld 01/14/2019 99  70 - 99 mg/dL Final  . BUN 01/14/2019 30* 6 - 23 mg/dL Final  . Creatinine, Ser 01/14/2019 1.09  0.40 - 1.20 mg/dL Final  . Calcium 01/14/2019 9.4  8.4 - 10.5 mg/dL Final  . GFR 01/14/2019 60.43  >60.00 mL/min Final  . Hgb A1c MFr Bld 01/14/2019 7.0* 4.6 - 6.5 % Final   Glycemic Control Guidelines for People with Diabetes:Non Diabetic:  <6%Goal of Therapy: <7%Additional Action Suggested:  >8%     Allergies as of 01/16/2019      Reactions   Codeine    Oxycodone Hcl    Penicillins    Chlorhexidine Gluconate Rash      Medication List  Accurate as of January 16, 2019  8:26 AM. If you have any questions, ask your nurse or doctor.        apixaban 5 MG Tabs tablet Commonly known as: ELIQUIS Take 1 tablet (5 mg total) by mouth 2 (two) times daily.   benazepril-hydrochlorthiazide 10-12.5 MG tablet Commonly known as: LOTENSIN HCT Take 1 tablet by mouth daily.   CALCIUM PO Take by mouth 2 (two) times daily.   cloNIDine 0.2 mg/24hr patch Commonly known as: Catapres-TTS-2 Place 1 patch (0.2 mg total) onto the skin once a week.   diltiazem 360 MG 24 hr capsule Commonly known as: CARDIZEM CD TAKE 1 CAPSULE BY MOUTH  DAILY   ergocalciferol 1.25 MG (50000 UT) capsule Commonly known as: VITAMIN D2 Take 50,000 Units by mouth once a week.   ferrous sulfate 325 (65 FE) MG EC tablet Take 1 tablet (325 mg total) by mouth 2 (two) times daily.   Insulin Pen Needle 32G X 4 MM  Misc Commonly known as: BD Pen Needle Nano U/F Use one per day to inject Victoza   Insulin Pen Needle 32G X 4 MM Misc Commonly known as: BD Pen Needle Nano U/F USE ONE PER DAY TO INJECT  VICTOZA   metFORMIN 500 MG 24 hr tablet Commonly known as: GLUCOPHAGE-XR TAKE 4 TABLETS BY MOUTH  DAILY WITH SUPPER.   metoprolol succinate 100 MG 24 hr tablet Commonly known as: TOPROL-XL TAKE 1 TABLET BY MOUTH  DAILY WITH OR IMMEDIATELY  FOLLOWING A MEAL   mupirocin ointment 2 % Commonly known as: BACTROBAN Place 1 application into the nose 2 (two) times daily.   OneTouch Delica Lancets Fine Misc Use to obtain a blood specimen 2 times per day dx code 250.02   OneTouch Verio IQ System w/Device Kit Use to check blood sugar 2 times per day dx code 250.02   OneTouch Verio test strip Generic drug: glucose blood USE AS DIRECTED TO CHECK  BLOOD SUGAR 2 TIMES DAILY   rosuvastatin 10 MG tablet Commonly known as: CRESTOR TAKE 1 TABLET BY MOUTH  DAILY   Semaglutide (1 MG/DOSE) 2 MG/1.5ML Sopn Commonly known as: Ozempic (1 MG/DOSE) Inject 1 mg into the skin once a week.       Allergies:  Allergies  Allergen Reactions  . Codeine   . Oxycodone Hcl   . Penicillins   . Chlorhexidine Gluconate Rash    Past Medical History:  Diagnosis Date  . Atrial fibrillation ([place])   . Chronic disease anemia 03/29/2011  . Diabetes mellitus   . Dyslipidemia   . Hypertension   . Obesities, morbid ([place])   . Osteoarthritis     Past Surgical History:  Procedure Laterality Date  . CHOLECYSTECTOMY    . REPLACEMENT TOTAL KNEE BILATERAL    . SHOULDER SURGERY     RIGHT SHOULDER  . TUBAL LIGATION      Family History  Problem Relation Age of Onset  . Heart attack Mother   . Stroke Mother   . Diabetes Mother   . Hypertension Brother   . Breast cancer Neg Hx     Social History:  reports that she has never smoked. She has never used smokeless tobacco. She reports that she does not drink alcohol or  use drugs.  Review of Systems:  History of atrial fibrillation: She is on Eliquis now instead of Coumadin and followed by cardiologist  Hypertension:  she has required multiple medications and blood pressure is usually well controlled She  is currently taking diltiazem, metoprolol, Catapres 0.2 patch, Lotensin HCT 10/12.5, 1/2 Her benazepril HCTZ was reduced to half tablet in March 2020  She does not monitor her blood pressure at home  Renal function is consistently normal as below Microalbumin is normal as of 5/20  BP Readings from Last 3 Encounters:  01/16/19 114/80  07/22/18 104/88  04/19/18 120/70   Lab Results  Component Value Date   CREATININE 1.09 01/14/2019   CREATININE 1.10 09/12/2018   CREATININE 1.02 07/18/2018     Lipids:  well controlled with Crestor and LDL is as follows:   Lab Results  Component Value Date   CHOL 162 09/12/2018   HDL 73.50 09/12/2018   LDLCALC 74 09/12/2018   TRIG 71.0 09/12/2018   CHOLHDL 2 09/12/2018    History of autonomous multinodular goiter TSH is generally below normal to a variable extent No recent problems with difficulty swallowing or pressure in the neck area  She has normal T3 and free T4 levels consistently   Lab Results  Component Value Date   T3FREE 2.8 09/12/2018   T3FREE 3.0 09/03/2017   T3FREE 3.2 09/25/2016      Lab Results  Component Value Date   FREET4 1.15 09/12/2018   FREET4 1.07 09/03/2017   FREET4 1.23 09/25/2016   TSH 0.29 (L) 09/12/2018   TSH 0.17 (L) 09/03/2017   TSH 0.21 (L) 09/25/2016   She has had chronic left lower leg edema    Examination:   BP 114/80 (BP Location: Left Arm, Patient Position: Sitting, Cuff Size: Large)   Pulse 72   Ht 5' 3"  (1.6 m)   Wt 219 lb 6.4 oz (99.5 kg)   SpO2 98%   BMI 38.86 kg/m   Body mass index is 38.86 kg/m.      ASSESSMENT/ PLAN:   Diabetes type 2, with morbid obesity on metformin and Ozempic maximum doses  See history of present illness  for detailed discussion of current diabetes management, blood sugar patterns and problems identified  Her A1c is improved at 7.0  She is on Ozempic and metformin With this she is apparently losing weight Usually fairly good with her diet Her blood sugars are averaging below 120 with some readings at all different times of the day She will continue the same regimen   HYPERTENSION:  Blood pressure is well controlled on multiple drug regimen No change in her regimen needed  She will continue to use 1/2 tablet of benazepril HCTZ  History of autonomous thyroid function: Thyroid levels will be rechecked on the next visit  She will get her influenza vaccine at her PCP office next month   Elayne Snare 01/16/2019, 8:26 AM   Note: This office note was prepared with Dragon voice recognition system technology. Any transcriptional errors that result from this process are unintentional.

## 2019-01-18 ENCOUNTER — Other Ambulatory Visit: Payer: Self-pay

## 2019-01-18 ENCOUNTER — Ambulatory Visit
Admission: RE | Admit: 2019-01-18 | Discharge: 2019-01-18 | Disposition: A | Discharge status: Home / Self Care | Payer: Medicare Other | Source: Ambulatory Visit | Attending: Internal Medicine | Admitting: Internal Medicine

## 2019-01-18 DIAGNOSIS — M5416 Radiculopathy, lumbar region: Secondary | ICD-10-CM

## 2019-01-23 ENCOUNTER — Other Ambulatory Visit: Payer: Self-pay

## 2019-01-23 DIAGNOSIS — D638 Anemia in other chronic diseases classified elsewhere: Secondary | ICD-10-CM

## 2019-01-24 ENCOUNTER — Inpatient Hospital Stay: Payer: Medicare Other | Attending: Hematology

## 2019-01-24 ENCOUNTER — Inpatient Hospital Stay: Payer: Medicare Other | Admitting: Hematology

## 2019-02-23 ENCOUNTER — Other Ambulatory Visit: Payer: Self-pay | Admitting: Endocrinology

## 2019-02-27 ENCOUNTER — Other Ambulatory Visit: Payer: Self-pay | Admitting: Endocrinology

## 2019-02-27 ENCOUNTER — Other Ambulatory Visit: Payer: Self-pay | Admitting: Cardiovascular Disease

## 2019-02-27 NOTE — Telephone Encounter (Signed)
Pt last saw Dr Acie Fredrickson 01/29/18, pt is overdue for follow-up.  Last labs 01/14/19 Creat 1.09, age 68, weight 99.5kg, based on specified criteria pt is on appropriate dosage Eliquis 5mg  BID.  Will refill rx and make note on rx to call office for appt with MD, overdue for follow-up.

## 2019-04-10 ENCOUNTER — Telehealth: Payer: Self-pay | Admitting: Endocrinology

## 2019-04-10 NOTE — Telephone Encounter (Signed)
Patient called and requested a replacement medication be sent in for Metformin to [facility].  Patient states that pharmacy does not want to fill Metformin.  Please call patient at [phone] for questions or clarification

## 2019-04-10 NOTE — Telephone Encounter (Signed)
Called [facility] and spoke to pharmacy tech regarding this and they stated that there were no issues and she filled the Rx while on the phone and stated that it would be sent out. Called pt and informed her of this. She verbalized understanding.

## 2019-05-19 ENCOUNTER — Other Ambulatory Visit (INDEPENDENT_AMBULATORY_CARE_PROVIDER_SITE_OTHER): Payer: Medicare Other

## 2019-05-19 ENCOUNTER — Other Ambulatory Visit: Payer: Self-pay

## 2019-05-19 DIAGNOSIS — E1165 Type 2 diabetes mellitus with hyperglycemia: Secondary | ICD-10-CM

## 2019-05-19 DIAGNOSIS — E042 Nontoxic multinodular goiter: Secondary | ICD-10-CM | POA: Diagnosis not present

## 2019-05-19 LAB — COMPREHENSIVE METABOLIC PANEL
ALT: 18 U/L (ref 0–35)
AST: 21 U/L (ref 0–37)
Albumin: 4.3 g/dL (ref 3.5–5.2)
Alkaline Phosphatase: 78 U/L (ref 39–117)
BUN: 29 mg/dL — ABNORMAL HIGH (ref 6–23)
CO2: 27 mEq/L (ref 19–32)
Calcium: 10.5 mg/dL (ref 8.4–10.5)
Chloride: 106 mEq/L (ref 96–112)
Creatinine, Ser: 1.07 mg/dL (ref 0.40–1.20)
GFR: 61.67 mL/min (ref >60.00)
Glucose, Bld: 118 mg/dL — ABNORMAL HIGH (ref 70–99)
Potassium: 3.9 mEq/L (ref 3.5–5.1)
Sodium: 141 mEq/L (ref 135–145)
Total Bilirubin: 0.5 mg/dL (ref 0.2–1.2)
Total Protein: 7.9 g/dL (ref 6.0–8.3)

## 2019-05-19 LAB — T3, FREE: T3, Free: 2.6 pg/mL (ref 2.3–4.2)

## 2019-05-19 LAB — LIPID PANEL
Cholesterol: 165 mg/dL (ref 0–200)
HDL: 82.9 mg/dL (ref >39.00)
LDL Cholesterol: 72 mg/dL (ref 0–99)
NonHDL: 82.17
Total CHOL/HDL Ratio: 2
Triglycerides: 52 mg/dL (ref 0.0–149.0)
VLDL: 10.4 mg/dL (ref 0.0–40.0)

## 2019-05-19 LAB — HEMOGLOBIN A1C: Hgb A1c MFr Bld: 7.1 % — ABNORMAL HIGH (ref 4.6–6.5)

## 2019-05-19 LAB — T4, FREE: Free T4: 1.31 ng/dL (ref 0.60–1.60)

## 2019-05-19 LAB — TSH: TSH: 0.29 u[IU]/mL — ABNORMAL LOW (ref 0.35–4.50)

## 2019-05-22 ENCOUNTER — Ambulatory Visit (INDEPENDENT_AMBULATORY_CARE_PROVIDER_SITE_OTHER): Payer: Medicare Other | Admitting: Endocrinology

## 2019-05-22 ENCOUNTER — Encounter: Payer: Self-pay | Admitting: Endocrinology

## 2019-05-22 VITALS — BP 128/88 | HR 55 | Ht 63.0 in | Wt 216.0 lb

## 2019-05-22 DIAGNOSIS — E669 Obesity, unspecified: Secondary | ICD-10-CM

## 2019-05-22 DIAGNOSIS — R7989 Other specified abnormal findings of blood chemistry: Secondary | ICD-10-CM | POA: Diagnosis not present

## 2019-05-22 DIAGNOSIS — E1169 Type 2 diabetes mellitus with other specified complication: Secondary | ICD-10-CM | POA: Diagnosis not present

## 2019-05-22 DIAGNOSIS — E7800 Pure hypercholesterolemia, unspecified: Secondary | ICD-10-CM

## 2019-05-22 DIAGNOSIS — I10 Essential (primary) hypertension: Secondary | ICD-10-CM

## 2019-05-22 NOTE — Patient Instructions (Signed)
Do full pill of Benazepril  Check blood sugars on waking up 2-3 days a week  Also check blood sugars about 2 hours after meals and do this after different meals by rotation  Recommended blood sugar levels on waking up are 90-130 and about 2 hours after meal is 130-160  Please bring your blood sugar monitor to each visit, thank you

## 2019-05-22 NOTE — Progress Notes (Signed)
Patient ID: Megan Fischer, female   DOB: 04/02/1951, 69 y.o.   MRN: [id number]    Reason for Appointment: Follow-up  History of Present Illness   Diagnosis: Type 2 DIABETES MELITUS, date of diagnosis:  2000     Previous history: She has been on various regimens of treatment in the past including Byetta and generally has done better with GLP-1 drugs She continues to take low-dose glimepiride which helps with overall control. Usually blood sugars are not variable Because of occasional hypoglycemia her Amaryl was stopped in 03/2014 Was on Victoza since 2013 until 2019  Recent history: Non-insulin hypoglycemic drugs: Metformin ER 500 mg, 4 tablets at dinner, Ozempic 1.0 mg weekly    Current blood sugar patterns and problems identified:  Her A1c is stable at 7.1  She has had very consistent blood sugars at home with only minimal postprandial hyperglycemia at times  No side effects from 1 mg Ozempic like nausea  Her weight is down another 3 pounds  She is not doing any formal exercise but she is usually fairly active at home  Blood sugar monitoring has been somewhat erratic but has some readings at all different times     Side effects from medications: None  Monitors blood glucose: once or twice a day.    Glucometer:  One Touch    PRE-MEAL Fasting Lunch Dinner Bedtime Overall  Glucose range:  98-124   116    Mean/median:      120   POST-MEAL PC Breakfast PC Lunch PC Dinner  Glucose range:  82-110   126-171  Mean/median:    148    Previous readings:  PRE-MEAL Fasting Lunch Dinner Bedtime Overall  Glucose range:  104-118   120-152    Mean/median:      122   POST-MEAL PC Breakfast PC Lunch PC Dinner  Glucose range:  113  99  104-192  Mean/median:        Wt Readings from Last 3 Encounters:  05/22/19 216 lb (98 kg)  01/16/19 219 lb 6.4 oz (99.5 kg)  07/22/18 223 lb (101.2 kg)   Lab Results  Component Value Date   HGBA1C 7.1 (H) 05/19/2019   HGBA1C  7.0 (H) 01/14/2019   HGBA1C 7.1 (H) 09/12/2018   Lab Results  Component Value Date   MICROALBUR 10.2 (H) 09/12/2018   LDLCALC 72 05/19/2019   CREATININE 1.07 05/19/2019   Other problems discussed today: See review of systems    Lab on 05/19/2019  Component Date Value Ref Range Status  . T3, Free 05/19/2019 2.6  2.3 - 4.2 pg/mL Final  . TSH 05/19/2019 0.29* 0.35 - 4.50 uIU/mL Final  . Free T4 05/19/2019 1.31  0.60 - 1.60 ng/dL Final   Comment: Specimens from patients who are undergoing biotin therapy and /or ingesting biotin supplements may contain high levels of biotin.  The higher biotin concentration in these specimens interferes with this Free T4 assay.  Specimens that contain high levels  of biotin may cause false high results for this Free T4 assay.  Please interpret results in light of the total clinical presentation of the patient.    . Cholesterol 05/19/2019 165  0 - 200 mg/dL Final   ATP III Classification       Desirable:  < 200 mg/dL               Borderline High:  200 - 239 mg/dL          High:  > =  240 mg/dL  . Triglycerides 05/19/2019 52.0  0.0 - 149.0 mg/dL Final   Normal:  <150 mg/dLBorderline High:  150 - 199 mg/dL  . HDL 05/19/2019 82.90  >39.00 mg/dL Final  . VLDL 05/19/2019 10.4  0.0 - 40.0 mg/dL Final  . LDL Cholesterol 05/19/2019 72  0 - 99 mg/dL Final  . Total CHOL/HDL Ratio 05/19/2019 2   Final                  Men          Women1/2 Average Risk     3.4          3.3Average Risk          5.0          4.42X Average Risk          9.6          7.13X Average Risk          15.0          11.0                      . NonHDL 05/19/2019 82.17   Final   NOTE:  Non-HDL goal should be 30 mg/dL higher than patient's LDL goal (i.e. LDL goal of < 70 mg/dL, would have non-HDL goal of < 100 mg/dL)  . Sodium 05/19/2019 141  135 - 145 mEq/L Final  . Potassium 05/19/2019 3.9  3.5 - 5.1 mEq/L Final  . Chloride 05/19/2019 106  96 - 112 mEq/L Final  . CO2 05/19/2019 27  19 - 32  mEq/L Final  . Glucose, Bld 05/19/2019 118* 70 - 99 mg/dL Final  . BUN 05/19/2019 29* 6 - 23 mg/dL Final  . Creatinine, Ser 05/19/2019 1.07  0.40 - 1.20 mg/dL Final  . Total Bilirubin 05/19/2019 0.5  0.2 - 1.2 mg/dL Final  . Alkaline Phosphatase 05/19/2019 78  39 - 117 U/L Final  . AST 05/19/2019 21  0 - 37 U/L Final  . ALT 05/19/2019 18  0 - 35 U/L Final  . Total Protein 05/19/2019 7.9  6.0 - 8.3 g/dL Final  . Albumin 05/19/2019 4.3  3.5 - 5.2 g/dL Final  . GFR 05/19/2019 61.67  >60.00 mL/min Final  . Calcium 05/19/2019 10.5  8.4 - 10.5 mg/dL Final  . Hgb A1c MFr Bld 05/19/2019 7.1* 4.6 - 6.5 % Final   Glycemic Control Guidelines for People with Diabetes:Non Diabetic:  <6%Goal of Therapy: <7%Additional Action Suggested:  >8%     Allergies as of 05/22/2019      Reactions   Codeine    Oxycodone Hcl    Penicillins    Chlorhexidine Gluconate Rash      Medication List       Accurate as of May 22, 2019 10:03 AM. If you have any questions, ask your nurse or doctor.        STOP taking these medications   mupirocin ointment 2 % Commonly known as: BACTROBAN Stopped by: Elayne Snare, MD     TAKE these medications   apixaban 5 MG Tabs tablet Commonly known as: Eliquis Take 1 tablet (5 mg total) by mouth 2 (two) times daily. Pt is overdue for follow-up, Must call office for appt for future refills.   benazepril-hydrochlorthiazide 10-12.5 MG tablet Commonly known as: LOTENSIN HCT Take 1 tablet by mouth daily.   CALCIUM PO Take by mouth 2 (two) times daily.   cloNIDine 0.2 mg/24hr patch  Commonly known as: Catapres-TTS-2 Place 1 patch (0.2 mg total) onto the skin once a week.   diltiazem 360 MG 24 hr capsule Commonly known as: CARDIZEM CD TAKE 1 CAPSULE BY MOUTH  DAILY   ergocalciferol 1.25 MG (50000 UT) capsule Commonly known as: VITAMIN D2 Take 50,000 Units by mouth once a week.   ferrous sulfate 325 (65 FE) MG EC tablet Take 1 tablet (325 mg total) by mouth 2 (two)  times daily.   Insulin Pen Needle 32G X 4 MM Misc Commonly known as: BD Pen Needle Nano U/F Use one per day to inject Victoza   Insulin Pen Needle 32G X 4 MM Misc Commonly known as: BD Pen Needle Nano U/F USE ONE PER DAY TO INJECT  VICTOZA   metFORMIN 500 MG 24 hr tablet Commonly known as: GLUCOPHAGE-XR TAKE 4 TABLETS BY MOUTH  DAILY WITH SUPPER.   metoprolol succinate 100 MG 24 hr tablet Commonly known as: TOPROL-XL TAKE 1 TABLET BY MOUTH  DAILY WITH OR IMMEDIATELY  FOLLOWING A MEAL   OneTouch Delica Lancets Fine Misc Use to obtain a blood specimen 2 times per day dx code 250.02   OneTouch Verio IQ System w/Device Kit Use to check blood sugar 2 times per day dx code 250.02   OneTouch Verio test strip Generic drug: glucose blood USE AS DIRECTED TO CHECK  BLOOD SUGAR 2 TIMES DAILY   Ozempic (1 MG/DOSE) 2 MG/1.5ML Sopn Generic drug: Semaglutide (1 MG/DOSE) INJECT 1 MG INTO THE SKIN  ONCE A WEEK.   rosuvastatin 10 MG tablet Commonly known as: CRESTOR TAKE 1 TABLET BY MOUTH  DAILY       Allergies:  Allergies  Allergen Reactions  . Codeine   . Oxycodone Hcl   . Penicillins   . Chlorhexidine Gluconate Rash    Past Medical History:  Diagnosis Date  . Atrial fibrillation ([place])   . Chronic disease anemia 03/29/2011  . Diabetes mellitus   . Dyslipidemia   . Hypertension   . Obesities, morbid ([place])   . Osteoarthritis     Past Surgical History:  Procedure Laterality Date  . CHOLECYSTECTOMY    . REPLACEMENT TOTAL KNEE BILATERAL    . SHOULDER SURGERY     RIGHT SHOULDER  . TUBAL LIGATION      Family History  Problem Relation Age of Onset  . Heart attack Mother   . Stroke Mother   . Diabetes Mother   . Hypertension Brother   . Breast cancer Neg Hx     Social History:  reports that she has never smoked. She has never used smokeless tobacco. She reports that she does not drink alcohol or use drugs.  Review of Systems:  History of atrial fibrillation: She  is on Eliquis  and followed by cardiologist  Hypertension:  she has required multiple medications and blood pressure is usually well controlled She is currently taking diltiazem, metoprolol, Catapres 0.2 patch, Lotensin HCT 10/12.5, 1/2 tablet daily Her benazepril HCTZ was reduced to half tablet in March 2020  Blood pressure is higher than usual today, checked twice  Renal function is consistently normal also Microalbumin is normal as of 5/20  BP Readings from Last 3 Encounters:  05/22/19 128/88  01/16/19 114/80  07/22/18 104/88   Lab Results  Component Value Date   CREATININE 1.07 05/19/2019   CREATININE 1.09 01/14/2019   CREATININE 1.10 09/12/2018     Lipids:  well controlled with Crestor 10 mg and LDL is as follows:  Lab Results  Component Value Date   CHOL 165 05/19/2019   HDL 82.90 05/19/2019   LDLCALC 72 05/19/2019   TRIG 52.0 05/19/2019   CHOLHDL 2 05/19/2019    History of autonomous multinodular goiter TSH is usually below normal to a variable extent Does not feel any palpitation No local pressure symptoms in the neck  She has normal T3 and free T4 levels consistently   Lab Results  Component Value Date   T3FREE 2.6 05/19/2019   T3FREE 2.8 09/12/2018   T3FREE 3.0 09/03/2017   T3FREE 3.2 09/25/2016      Lab Results  Component Value Date   FREET4 1.31 05/19/2019   FREET4 1.15 09/12/2018   FREET4 1.07 09/03/2017   TSH 0.29 (L) 05/19/2019   TSH 0.29 (L) 09/12/2018   TSH 0.17 (L) 09/03/2017   No increase in left leg swelling recently    Examination:   BP 128/88 (Cuff Size: Large)   Pulse (!) 55   Ht 5' 3"  (1.6 m)   Wt 216 lb (98 kg)   SpO2 96%   BMI 38.26 kg/m   Body mass index is 38.26 kg/m.      ASSESSMENT/ PLAN:   Diabetes type 2, with morbid obesity on metformin and Ozempic maximum doses  See history of present illness for detailed discussion of current diabetes management, blood sugar patterns and problems identified  Her  A1c is reasonably good at 7.1  Her home blood sugars are averaging 120 and likely A1c is higher than expected Lab glucose 118 fasting Also has minimal postprandial hyperglycemia  She is on Ozempic 1 mg weekly and metformin She has had some weight loss which is continuing Overall is staying active and diet is generally fairly good She will continue the same treatment plan  LIPIDS: Very well controlled with Crestor  HYPERTENSION:  Blood pressure is now not well controlled on her current drug regimen Instead of 1/2 tablet of benazepril HCTZ she will take the whole tablet She will be following up with her PCP and cardiologist within the next couple of months  History of autonomous thyroid function: Thyroid levels are consistently normal except TSH which is slightly low as before  She will get her COVID-19 vaccine when available, discussed need to do this because of her high risk status   Elayne Snare 05/22/2019, 10:03 AM   Note: This office note was prepared with Dragon voice recognition system technology. Any transcriptional errors that result from this process are unintentional.

## 2019-06-12 ENCOUNTER — Other Ambulatory Visit: Payer: Self-pay | Admitting: Endocrinology

## 2019-06-12 ENCOUNTER — Other Ambulatory Visit: Payer: Self-pay | Admitting: Cardiovascular Disease

## 2019-06-12 NOTE — Telephone Encounter (Addendum)
Eliquis 5mg  refill request received, pt is 69yrs old, weight-98kg, Crea-1.07 on 05/19/2019, Diagnosis-Afib, and last seen by Dr. 07/17/2019 on 01/29/2018-NEEDS AN APPT. Dose is appropriate based on dosing criteria.   Pt needs an appt with Cardiologist. Will call the pt at an appropriate time this morning.   At 908am, spoke with the pt and she is aware that she needs an appt with Dr. 01/31/2018 or an Extended Provider to continue refills. She was transferred to the Scheduling dept. Will await for the appt to be made then send in refill.  Pt has an appt on 07/01/2019, will send in a refill for the pt.

## 2019-07-01 ENCOUNTER — Ambulatory Visit: Payer: Medicare Other | Admitting: Cardiovascular Disease

## 2019-07-29 ENCOUNTER — Encounter: Payer: Self-pay | Admitting: Cardiovascular Disease

## 2019-07-29 NOTE — Progress Notes (Signed)
Megan Fischer Date of Birth  10/24/1950 [facility] [address] [place], [place]  [address] [phone]   Problem List 1. HTN 2. Anemia - iron deficiency 3. Diabetes Mellitus 4.   Atrial Fibrillation - [place] [facility] 5. S/p knee replacements bilaterally 6. 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 She presents today for followup visit. She does not have any complaints.  She has lost around 65 lbs since last year.  January 29, 2018:  Megan Fischer is seen today for follow up of her atrial fibrillation.  Her atrial  Fib  is now more persistent.  She is completely asymptomatic and cannot tell when she is in atrial fibrillation or not. Has rare episodes of chest soreness.  She has occasionally woken up with chest pain.  The pains typically get worse if she moves around through the day. Is exercising some.   Chest pains are not worsened with exercise.  There is a pleuritic component to these episodes of chest pain. The pains are definitely   worsened when she twists and turns her torso. No dyspnea   Sees her primary MD regulalry  Does not eat much salt .   July 29, 2019: Megan Fischer is seen for follow up of her congestive heart failure, atrial fib ,  and hypertension.  She also has diabetes mellitus.    Has not gotten her covid vaccines yet.  Going today  No cp or dyspnea. Not much exercise . Planning to exercise. Her husband passed away last month ( parkinsons disease)  Advised some weight loss   Current Outpatient Medications on File Prior to Visit  Medication Sig Dispense Refill  . benazepril-hydrochlorthiazide (LOTENSIN HCT) 10-12.5 MG tablet TAKE 1 TABLET BY MOUTH  DAILY 90 tablet 3  . Blood Glucose Monitoring Suppl (ONETOUCH VERIO IQ SYSTEM) W/DEVICE KIT Use to  check blood sugar 2 times per day dx code 250.02 1 kit 0  . CALCIUM PO Take by mouth 2 (two) times daily.      . cloNIDine (CATAPRES-TTS-2) 0.2 mg/24hr patch Place 1 patch (0.2 mg total) onto the skin once a week. 4 patch 2  . diltiazem (CARDIZEM CD) 360 MG 24 hr capsule TAKE 1 CAPSULE BY MOUTH  DAILY 90 capsule 3  . ELIQUIS 5 MG TABS tablet TAKE 1 TABLET BY MOUTH  TWICE DAILY 180 tablet 1  . ergocalciferol (VITAMIN D2) 50000 UNITS capsule Take 50,000 Units by mouth once a week.      . ferrous sulfate 325 (65 FE) MG EC tablet Take 1 tablet (325 mg total) by mouth 2 (two) times daily. 60 tablet 3  . Insulin Pen Needle (BD PEN NEEDLE NANO U/F) 32G X 4 MM MISC Use one per day to inject Victoza 90 each 1  . Insulin Pen Needle (BD PEN NEEDLE NANO U/F) 32G X 4 MM MISC USE ONE PER DAY TO INJECT  VICTOZA 90 each 3  . metFORMIN (GLUCOPHAGE-XR) 500 MG 24 hr tablet TAKE 4 TABLETS BY MOUTH  DAILY WITH SUPPER. 360 tablet 3  . metoprolol succinate (TOPROL-XL) 100 MG 24 hr tablet TAKE 1 TABLET BY MOUTH  DAILY WITH OR IMMEDIATELY  FOLLOWING A MEAL 90 tablet 3  . ONETOUCH DELICA LANCETS FINE MISC Use to obtain a blood specimen 2 times per  day dx code 250.02 200 each 0  . ONETOUCH VERIO test strip USE AS DIRECTED TO CHECK  BLOOD SUGAR 2 TIMES DAILY 200 strip 3  . OZEMPIC, 1 MG/DOSE, 2 MG/1.5ML SOPN INJECT 1 MG INTO THE SKIN  ONCE A WEEK. 9 mL 3  . rosuvastatin (CRESTOR) 10 MG tablet TAKE 1 TABLET BY MOUTH  DAILY 90 tablet 3   No current facility-administered medications on file prior to visit.    Allergies  Allergen Reactions  . Codeine   . Oxycodone Hcl   . Penicillins   . Chlorhexidine Gluconate Rash    Past Medical History:  Diagnosis Date  . Atrial fibrillation ([place])   . Chronic disease anemia 03/29/2011  . Diabetes mellitus   . Dyslipidemia   . Hypertension   . Obesities, morbid ([place])   . Osteoarthritis     Past Surgical History:  Procedure Laterality Date  . CHOLECYSTECTOMY    .  REPLACEMENT TOTAL KNEE BILATERAL    . SHOULDER SURGERY     RIGHT SHOULDER  . TUBAL LIGATION      Social History   Tobacco Use  Smoking Status Never Smoker  Smokeless Tobacco Never Used    Social History   Substance and Sexual Activity  Alcohol Use No    Family History  Problem Relation Age of Onset  . Heart attack Mother   . Stroke Mother   . Diabetes Mother   . Hypertension Brother   . Breast cancer Neg Hx     Reviw of Systems:  Reviewed in the HPI.  All other systems are negative.  Physical Exam: There were no vitals taken for this visit.  GEN:   Middle age female, nad  HEENT: Normal NECK: No JVD; No carotid bruits LYMPHATICS: No lymphadenopathy CARDIAC:  Irreg. Irreg.  RESPIRATORY:  Clear to auscultation without rales, wheezing or rhonchi  ABDOMEN: Soft, non-tender, non-distended MUSCULOSKELETAL:  No edema; No deformity  SKIN: Warm and dry NEUROLOGIC:  Alert and oriented x 3      ECG: July 30, 2019:  Afibiwith HR of 81.  NS ST abn  Assessment / Plan:   1.  Atrial fib -her heart rate is well controlled.  She is now on eliquis instead of coumadin. Labs have been well controlled.     2.   :  Hypertension: Blood pressure is well controlled.  Continue current medications.  Renal function has been well controlled.  3.  Hyperlipidemia :   Labs look good.   Cont rosuvastatin      Mertie Moores, MD  07/30/2019 8:36 AM    [facility] [address],  [address] [place], [place]  [address] Phone: [phone]; Fax: [phone]

## 2019-07-30 ENCOUNTER — Other Ambulatory Visit: Payer: Self-pay

## 2019-07-30 ENCOUNTER — Encounter: Payer: Self-pay | Admitting: Cardiovascular Disease

## 2019-07-30 ENCOUNTER — Ambulatory Visit (INDEPENDENT_AMBULATORY_CARE_PROVIDER_SITE_OTHER): Payer: Medicare Other | Admitting: Cardiovascular Disease

## 2019-07-30 VITALS — BP 118/84 | HR 74 | Ht 63.0 in | Wt 214.2 lb

## 2019-07-30 DIAGNOSIS — I10 Essential (primary) hypertension: Secondary | ICD-10-CM

## 2019-07-30 DIAGNOSIS — E785 Hyperlipidemia, unspecified: Secondary | ICD-10-CM | POA: Diagnosis not present

## 2019-07-30 NOTE — Patient Instructions (Signed)
Medication Instructions:  No changes *If you need a refill on your cardiac medications before your next appointment, please call your pharmacy*   Lab Work: none If you have labs (blood work) drawn today and your tests are completely normal, you will receive your results only by: . MyChart Message (if you have MyChart) OR . A paper copy in the mail If you have any lab test that is abnormal or we need to change your treatment, we will call you to review the results.   Testing/Procedures: none   Follow-Up: At [facility], you and your health needs are our priority.  As part of our continuing mission to provide you with exceptional heart care, we have created designated Provider Care Teams.  These Care Teams include your primary Cardiologist (physician) and Advanced Practice Providers (APPs -  Physician Assistants and Nurse Practitioners) who all work together to provide you with the care you need, when you need it.  We recommend signing up for the patient portal called "MyChart".  Sign up information is provided on this After Visit Summary.  MyChart is used to connect with patients for Virtual Visits (Telemedicine).  Patients are able to view lab/test results, encounter notes, upcoming appointments, etc.  Non-urgent messages can be sent to your provider as well.   To learn more about what you can do with MyChart, go to [email or web address].    Your next appointment:   12 month(s)  The format for your next appointment:   In Person  Provider:   You may see one of the following Advanced Practice Providers on your designated Care Team:    Scott Weaver, PA-C  Vin Bhagat, PA-C  Janine Hammond, NP    Other Instructions   

## 2019-07-30 NOTE — Addendum Note (Signed)
Addended by: Melanee Spry on: 07/30/2019 01:59 PM   Modules accepted: Orders

## 2019-09-17 ENCOUNTER — Other Ambulatory Visit (INDEPENDENT_AMBULATORY_CARE_PROVIDER_SITE_OTHER): Payer: Medicare Other

## 2019-09-17 ENCOUNTER — Other Ambulatory Visit: Payer: Self-pay

## 2019-09-17 DIAGNOSIS — E669 Obesity, unspecified: Secondary | ICD-10-CM

## 2019-09-17 DIAGNOSIS — E1169 Type 2 diabetes mellitus with other specified complication: Secondary | ICD-10-CM | POA: Diagnosis not present

## 2019-09-17 LAB — URINALYSIS, ROUTINE W REFLEX MICROSCOPIC
Bilirubin Urine: NEGATIVE
Hgb urine dipstick: NEGATIVE
Ketones, ur: NEGATIVE
Leukocytes,Ua: NEGATIVE
Nitrite: NEGATIVE
RBC / HPF: NONE SEEN (ref 0–?)
Specific Gravity, Urine: 1.025 (ref 1.000–1.030)
Urine Glucose: NEGATIVE
Urobilinogen, UA: 0.2 (ref 0.0–1.0)
pH: 6 (ref 5.0–8.0)

## 2019-09-17 LAB — COMPREHENSIVE METABOLIC PANEL
ALT: 18 U/L (ref 0–35)
AST: 24 U/L (ref 0–37)
Albumin: 4 g/dL (ref 3.5–5.2)
Alkaline Phosphatase: 64 U/L (ref 39–117)
BUN: 32 mg/dL — ABNORMAL HIGH (ref 6–23)
CO2: 29 mEq/L (ref 19–32)
Calcium: 10 mg/dL (ref 8.4–10.5)
Chloride: 103 mEq/L (ref 96–112)
Creatinine, Ser: 1.03 mg/dL (ref 0.40–1.20)
GFR: 64.38 mL/min (ref >60.00)
Glucose, Bld: 112 mg/dL — ABNORMAL HIGH (ref 70–99)
Potassium: 4.1 mEq/L (ref 3.5–5.1)
Sodium: 140 mEq/L (ref 135–145)
Total Bilirubin: 0.5 mg/dL (ref 0.2–1.2)
Total Protein: 7.3 g/dL (ref 6.0–8.3)

## 2019-09-17 LAB — MICROALBUMIN / CREATININE URINE RATIO
Creatinine,U: 133.4 mg/dL
Microalb Creat Ratio: 9.1 mg/g (ref 0.0–30.0)
Microalb, Ur: 12.1 mg/dL — ABNORMAL HIGH (ref 0.0–1.9)

## 2019-09-17 LAB — HEMOGLOBIN A1C: Hgb A1c MFr Bld: 7.2 % — ABNORMAL HIGH (ref 4.6–6.5)

## 2019-09-22 ENCOUNTER — Other Ambulatory Visit: Payer: Self-pay

## 2019-09-22 ENCOUNTER — Ambulatory Visit (INDEPENDENT_AMBULATORY_CARE_PROVIDER_SITE_OTHER): Payer: Medicare Other | Admitting: Endocrinology

## 2019-09-22 ENCOUNTER — Encounter: Payer: Self-pay | Admitting: Endocrinology

## 2019-09-22 VITALS — BP 134/80 | HR 78 | Ht 63.0 in | Wt 219.2 lb

## 2019-09-22 DIAGNOSIS — E1165 Type 2 diabetes mellitus with hyperglycemia: Secondary | ICD-10-CM

## 2019-09-22 DIAGNOSIS — I10 Essential (primary) hypertension: Secondary | ICD-10-CM | POA: Diagnosis not present

## 2019-09-22 DIAGNOSIS — E042 Nontoxic multinodular goiter: Secondary | ICD-10-CM

## 2019-09-22 NOTE — Progress Notes (Signed)
Patient ID: Megan Fischer, female   DOB: 11/13/1950, 69 y.o.   MRN: [id number]    Reason for Appointment: Follow-up  History of Present Illness   Diagnosis: Type 2 DIABETES MELITUS, date of diagnosis:  2000     Previous history: She has been on various regimens of treatment in the past including Byetta and generally has done better with GLP-1 drugs She continues to take low-dose glimepiride which helps with overall control. Usually blood sugars are not variable Because of occasional hypoglycemia her Amaryl was stopped in 03/2014 Was on Victoza since 2013 until 2019  Recent history: Non-insulin hypoglycemic drugs: Metformin ER 500 mg, 4 tablets at dinner, Ozempic 1.0 mg weekly, on Tuesdays   Current blood sugar patterns and problems identified:  Her A1c is gradually increasing and now 7.2  She said that she has not been paying attention to her diet and eating on the run  Has been less consistent because of loss of her husband earlier this year  Previously was losing weight and now is getting back  Also not able to do much physical activity because of leg pain  Did not bring her monitor for download and not clear what her blood sugar patterns are with she thinks she has only had 1 or 2 high readings  Has not missed any doses of Ozempic or Metformin     Side effects from medications: None  Monitors blood glucose: once or twice a day.    Glucometer:  One Touch   Recent fasting 115, 109 Postprandial range 115-224  Previous readings:  PRE-MEAL Fasting Lunch Dinner Bedtime Overall  Glucose range:  98-124   116    Mean/median:      120   POST-MEAL PC Breakfast PC Lunch PC Dinner  Glucose range:  82-110   126-171  Mean/median:    148     Wt Readings from Last 3 Encounters:  09/22/19 219 lb 3.2 oz (99.4 kg)  07/30/19 214 lb 4 oz (97.2 kg)  05/22/19 216 lb (98 kg)   Lab Results  Component Value Date   HGBA1C 7.2 (H) 09/17/2019   HGBA1C 7.1 (H) 05/19/2019     HGBA1C 7.0 (H) 01/14/2019   Lab Results  Component Value Date   MICROALBUR 12.1 (H) 09/17/2019   LDLCALC 72 05/19/2019   CREATININE 1.03 09/17/2019   Other problems discussed today: See review of systems    Lab on 09/17/2019  Component Date Value Ref Range Status  . Microalb, Ur 09/17/2019 12.1* 0.0 - 1.9 mg/dL Final  . Creatinine,U 09/17/2019 133.4  mg/dL Final  . Microalb Creat Ratio 09/17/2019 9.1  0.0 - 30.0 mg/g Final  . Color, Urine 09/17/2019 YELLOW  Yellow;Lt. Yellow;Straw;Dark Yellow;Amber;Green;Red;Brown Final  . APPearance 09/17/2019 CLEAR  Clear;Turbid;Slightly Cloudy;Cloudy Final  . Specific Gravity, Urine 09/17/2019 1.025  1.000 - 1.030 Final  . pH 09/17/2019 6.0  5.0 - 8.0 Final  . Total Protein, Urine 09/17/2019 TRACE* Negative Final  . Urine Glucose 09/17/2019 NEGATIVE  Negative Final  . Ketones, ur 09/17/2019 NEGATIVE  Negative Final  . Bilirubin Urine 09/17/2019 NEGATIVE  Negative Final  . Hgb urine dipstick 09/17/2019 NEGATIVE  Negative Final  . Urobilinogen, UA 09/17/2019 0.2  0.0 - 1.0 Final  . Leukocytes,Ua 09/17/2019 NEGATIVE  Negative Final  . Nitrite 09/17/2019 NEGATIVE  Negative Final  . WBC, UA 09/17/2019 0-2/hpf  0-2/hpf Final  . RBC / HPF 09/17/2019 none seen  0-2/hpf Final  . Mucus, UA 09/17/2019 Presence  of* None Final  . Squamous Epithelial / LPF 09/17/2019 Few(5-10/hpf)* Rare(0-4/hpf) Final  . Bacteria, UA 09/17/2019 Few(10-50/hpf)* None Final  . Sodium 09/17/2019 140  135 - 145 mEq/L Final  . Potassium 09/17/2019 4.1  3.5 - 5.1 mEq/L Final  . Chloride 09/17/2019 103  96 - 112 mEq/L Final  . CO2 09/17/2019 29  19 - 32 mEq/L Final  . Glucose, Bld 09/17/2019 112* 70 - 99 mg/dL Final  . BUN 09/17/2019 32* 6 - 23 mg/dL Final  . Creatinine, Ser 09/17/2019 1.03  0.40 - 1.20 mg/dL Final  . Total Bilirubin 09/17/2019 0.5  0.2 - 1.2 mg/dL Final  . Alkaline Phosphatase 09/17/2019 64  39 - 117 U/L Final  . AST 09/17/2019 24  0 - 37 U/L Final  .  ALT 09/17/2019 18  0 - 35 U/L Final  . Total Protein 09/17/2019 7.3  6.0 - 8.3 g/dL Final  . Albumin 09/17/2019 4.0  3.5 - 5.2 g/dL Final  . GFR 09/17/2019 64.38  >60.00 mL/min Final  . Calcium 09/17/2019 10.0  8.4 - 10.5 mg/dL Final  . Hgb A1c MFr Bld 09/17/2019 7.2* 4.6 - 6.5 % Final   Glycemic Control Guidelines for People with Diabetes:Non Diabetic:  <6%Goal of Therapy: <7%Additional Action Suggested:  >8%     Allergies as of 09/22/2019      Reactions   Codeine    Oxycodone Hcl    Penicillins    Chlorhexidine Gluconate Rash      Medication List       Accurate as of Sep 22, 2019  8:37 AM. If you have any questions, ask your nurse or doctor.        benazepril-hydrochlorthiazide 10-12.5 MG tablet Commonly known as: LOTENSIN HCT TAKE 1 TABLET BY MOUTH  DAILY   CALCIUM PO Take by mouth 2 (two) times daily.   cloNIDine 0.2 mg/24hr patch Commonly known as: Catapres-TTS-2 Place 1 patch (0.2 mg total) onto the skin once a week.   diltiazem 360 MG 24 hr capsule Commonly known as: CARDIZEM CD TAKE 1 CAPSULE BY MOUTH  DAILY   Eliquis 5 MG Tabs tablet Generic drug: apixaban TAKE 1 TABLET BY MOUTH  TWICE DAILY   ergocalciferol 1.25 MG (50000 UT) capsule Commonly known as: VITAMIN D2 Take 50,000 Units by mouth once a week.   ferrous sulfate 325 (65 FE) MG EC tablet Take 1 tablet (325 mg total) by mouth 2 (two) times daily.   Insulin Pen Needle 32G X 4 MM Misc Commonly known as: BD Pen Needle Nano U/F USE ONE PER DAY TO INJECT  VICTOZA   metFORMIN 500 MG 24 hr tablet Commonly known as: GLUCOPHAGE-XR TAKE 4 TABLETS BY MOUTH  DAILY WITH SUPPER.   metoprolol succinate 100 MG 24 hr tablet Commonly known as: TOPROL-XL TAKE 1 TABLET BY MOUTH  DAILY WITH OR IMMEDIATELY  FOLLOWING A MEAL   ofloxacin 0.3 % ophthalmic solution Commonly known as: OCUFLOX Place 1 drop into the left eye daily.   OneTouch Delica Lancets Fine Misc Use to obtain a blood specimen 2 times per day  dx code 250.02   OneTouch Verio IQ System w/Device Kit Use to check blood sugar 2 times per day dx code 250.02   OneTouch Verio test strip Generic drug: glucose blood USE AS DIRECTED TO CHECK  BLOOD SUGAR 2 TIMES DAILY   Ozempic (1 MG/DOSE) 2 MG/1.5ML Sopn Generic drug: Semaglutide (1 MG/DOSE) INJECT 1 MG INTO THE SKIN  ONCE A WEEK.  prednisoLONE acetate 1 % ophthalmic suspension Commonly known as: PRED FORTE Place 1 drop into the left eye daily.   rosuvastatin 10 MG tablet Commonly known as: CRESTOR TAKE 1 TABLET BY MOUTH  DAILY       Allergies:  Allergies  Allergen Reactions  . Codeine   . Oxycodone Hcl   . Penicillins   . Chlorhexidine Gluconate Rash    Past Medical History:  Diagnosis Date  . Atrial fibrillation ([place])   . Chronic disease anemia 03/29/2011  . Diabetes mellitus   . Dyslipidemia   . Hypertension   . Obesities, morbid ([place])   . Osteoarthritis     Past Surgical History:  Procedure Laterality Date  . CHOLECYSTECTOMY    . REPLACEMENT TOTAL KNEE BILATERAL    . SHOULDER SURGERY     RIGHT SHOULDER  . TUBAL LIGATION      Family History  Problem Relation Age of Onset  . Heart attack Mother   . Stroke Mother   . Diabetes Mother   . Hypertension Brother   . Breast cancer Neg Hx     Social History:  reports that she has never smoked. She has never used smokeless tobacco. She reports that she does not drink alcohol or use drugs.  Review of Systems:  History of atrial fibrillation: She is on Eliquis  and followed by cardiologist  Hypertension:  she has required multiple medications for control  She is currently taking diltiazem, metoprolol, Catapres 0.2 patch, Lotensin HCT 10/12.5, 1/2 tablet daily Her benazepril HCTZ was increased to the whole tablet on her visit in January  Renal function is consistently normal also Microalbumin is normal as of 09/2019  BP Readings from Last 3 Encounters:  09/22/19 134/80  07/30/19 118/84  05/22/19  128/88   Lab Results  Component Value Date   CREATININE 1.03 09/17/2019   CREATININE 1.07 05/19/2019   CREATININE 1.09 01/14/2019     Lipids:  well controlled with Crestor 10 mg and LDL is as follows:   Lab Results  Component Value Date   CHOL 165 05/19/2019   HDL 82.90 05/19/2019   LDLCALC 72 05/19/2019   TRIG 52.0 05/19/2019   CHOLHDL 2 05/19/2019    History of autonomous multinodular goiter TSH is usually below normal with some variability No recent problems with rapid heart rate or palpitations  She has normal T3 and free T4 levels consistently   Lab Results  Component Value Date   T3FREE 2.6 05/19/2019   T3FREE 2.8 09/12/2018   T3FREE 3.0 09/03/2017   T3FREE 3.2 09/25/2016      Lab Results  Component Value Date   FREET4 1.31 05/19/2019   FREET4 1.15 09/12/2018   FREET4 1.07 09/03/2017   TSH 0.29 (L) 05/19/2019   TSH 0.29 (L) 09/12/2018   TSH 0.17 (L) 09/03/2017       Examination:   BP 134/80 (BP Location: Left Arm, Patient Position: Sitting, Cuff Size: Large)   Pulse 78   Ht 5' 3"  (1.6 m)   Wt 219 lb 3.2 oz (99.4 kg)   SpO2 99%   BMI 38.83 kg/m   Body mass index is 38.83 kg/m.     ASSESSMENT/ PLAN:   Diabetes type 2, with morbid obesity on metformin and Ozempic 1 mg doses  See history of present illness for detailed discussion of current diabetes management, blood sugar patterns and problems identified  Her A1c is 7.2  Her home blood sugars are usually lower than expected for her  A1c Although she did not bring her meter for download and likely how often she is monitoring she still reports fairly good readings at home She is gaining weight from not watching her diet or planning her meals  Lab glucose 120 fasting  She will stay on the same regimen of Metformin and Ozempic but try to plan her meals well and she is aware of improvement she needs to make Follow-up in 3 months again  LIPIDS: Has been well controlled with  Crestor  HYPERTENSION:  Blood pressure is improved with increasing her benazepril to the whole tablet  Urine microalbumin normal    Elayne Snare 09/22/2019, 8:37 AM   Note: This office note was prepared with Dragon voice recognition system technology. Any transcriptional errors that result from this process are unintentional.

## 2019-09-22 NOTE — Patient Instructions (Signed)
Check blood sugars on waking up 2-3 days a week  Also check blood sugars about 2 hours after meals and do this after different meals by rotation  Recommended blood sugar levels on waking up are 90-130 and about 2 hours after meal is 130-160  Please bring your blood sugar monitor to each visit, thank you   

## 2019-11-30 ENCOUNTER — Other Ambulatory Visit: Payer: Self-pay | Admitting: Cardiovascular Disease

## 2019-11-30 ENCOUNTER — Other Ambulatory Visit: Payer: Self-pay | Admitting: Endocrinology

## 2019-12-01 NOTE — Telephone Encounter (Signed)
Prescription refill request for Eliquis received.  Last office visit: 07/30/2019, Nahser Scr: 1.4, 11/03/2019 Age: 69 y.o. Weight: 99.4 kg   Prescription refill sent.

## 2019-12-23 ENCOUNTER — Other Ambulatory Visit: Payer: Self-pay

## 2019-12-23 ENCOUNTER — Other Ambulatory Visit (INDEPENDENT_AMBULATORY_CARE_PROVIDER_SITE_OTHER): Payer: Medicare Other

## 2019-12-23 DIAGNOSIS — E1165 Type 2 diabetes mellitus with hyperglycemia: Secondary | ICD-10-CM | POA: Diagnosis not present

## 2019-12-23 DIAGNOSIS — E042 Nontoxic multinodular goiter: Secondary | ICD-10-CM

## 2019-12-23 LAB — BASIC METABOLIC PANEL
BUN: 29 mg/dL — ABNORMAL HIGH (ref 6–23)
CO2: 28 mEq/L (ref 19–32)
Calcium: 10.7 mg/dL — ABNORMAL HIGH (ref 8.4–10.5)
Chloride: 102 mEq/L (ref 96–112)
Creatinine, Ser: 1.2 mg/dL (ref 0.40–1.20)
GFR: 53.93 mL/min — ABNORMAL LOW (ref >60.00)
Glucose, Bld: 136 mg/dL — ABNORMAL HIGH (ref 70–99)
Potassium: 4.1 mEq/L (ref 3.5–5.1)
Sodium: 140 mEq/L (ref 135–145)

## 2019-12-23 LAB — T3, FREE: T3, Free: 3.5 pg/mL (ref 2.3–4.2)

## 2019-12-23 LAB — TSH: TSH: 0.39 u[IU]/mL (ref 0.35–4.50)

## 2019-12-23 LAB — HEMOGLOBIN A1C: Hgb A1c MFr Bld: 7.2 % — ABNORMAL HIGH (ref 4.6–6.5)

## 2019-12-23 LAB — T4, FREE: Free T4: 1.19 ng/dL (ref 0.60–1.60)

## 2019-12-26 ENCOUNTER — Ambulatory Visit (INDEPENDENT_AMBULATORY_CARE_PROVIDER_SITE_OTHER): Payer: Medicare Other | Admitting: Endocrinology

## 2019-12-26 ENCOUNTER — Other Ambulatory Visit: Payer: Self-pay

## 2019-12-26 ENCOUNTER — Encounter: Payer: Self-pay | Admitting: Endocrinology

## 2019-12-26 VITALS — BP 140/86 | HR 93 | Ht 63.0 in | Wt 219.6 lb

## 2019-12-26 DIAGNOSIS — E042 Nontoxic multinodular goiter: Secondary | ICD-10-CM | POA: Diagnosis not present

## 2019-12-26 DIAGNOSIS — I10 Essential (primary) hypertension: Secondary | ICD-10-CM | POA: Diagnosis not present

## 2019-12-26 DIAGNOSIS — E1165 Type 2 diabetes mellitus with hyperglycemia: Secondary | ICD-10-CM

## 2019-12-26 DIAGNOSIS — E7800 Pure hypercholesterolemia, unspecified: Secondary | ICD-10-CM | POA: Diagnosis not present

## 2019-12-26 NOTE — Progress Notes (Signed)
Patient ID: Megan Fischer, female   DOB: 11-30-1950, 69 y.o.   MRN: [id number]    Reason for Appointment: Follow-up  History of Present Illness   Diagnosis: Type 2 DIABETES MELITUS, date of diagnosis:  2000     Previous history: She has been on various regimens of treatment in the past including Byetta and generally has done better with GLP-1 drugs She continues to take low-dose glimepiride which helps with overall control. Usually blood sugars are not variable Because of occasional hypoglycemia her Amaryl was stopped in 03/2014 Was on Victoza since 2013 until 2019  Recent history: Non-insulin hypoglycemic drugs: Metformin ER 500 mg, 4 tablets at dinner, Ozempic 1.0 mg weekly, on Tuesdays   Current blood sugar patterns and problems identified:  Her A1c is still 7.2  As before she has not followed her diet and eating without planning her meals and frequently fast food again  Also possibly stress eating  However has not gained any weight  She has not been very active because of leg pain  Is consistent with her medications including weekly Trulicity  She does check blood sugars at various times  Although home blood sugars are not any higher than before she still has occasional readings over 180 and her lab glucose was 136 fasting     Side effects from medications: None  Monitors blood glucose: once or twice a day.    Glucometer:  One Touch    PRE-MEAL Fasting Lunch Dinner Bedtime Overall  Glucose range:       Mean/median: 111 138   129   POST-MEAL PC Breakfast PC Lunch PC Dinner  Glucose range:   104-210  Mean/median:   141    Previous fasting 115, 109 Postprandial range 115-224     Wt Readings from Last 3 Encounters:  12/26/19 219 lb 9.6 oz (99.6 kg)  09/22/19 219 lb 3.2 oz (99.4 kg)  07/30/19 214 lb 4 oz (97.2 kg)   Lab Results  Component Value Date   HGBA1C 7.2 (H) 12/23/2019   HGBA1C 7.2 (H) 09/17/2019   HGBA1C 7.1 (H) 05/19/2019   Lab  Results  Component Value Date   MICROALBUR 12.1 (H) 09/17/2019   LDLCALC 72 05/19/2019   CREATININE 1.20 12/23/2019   Other problems discussed today: See review of systems    Lab on 12/23/2019  Component Date Value Ref Range Status   T3, Free 12/23/2019 3.5  2.3 - 4.2 pg/mL Final   Free T4 12/23/2019 1.19  0.60 - 1.60 ng/dL Final   Comment: Specimens from patients who are undergoing biotin therapy and /or ingesting biotin supplements may contain high levels of biotin.  The higher biotin concentration in these specimens interferes with this Free T4 assay.  Specimens that contain high levels  of biotin may cause false high results for this Free T4 assay.  Please interpret results in light of the total clinical presentation of the patient.     TSH 12/23/2019 0.39  0.35 - 4.50 uIU/mL Final   Sodium 12/23/2019 140  135 - 145 mEq/L Final   Potassium 12/23/2019 4.1  3.5 - 5.1 mEq/L Final   Chloride 12/23/2019 102  96 - 112 mEq/L Final   CO2 12/23/2019 28  19 - 32 mEq/L Final   Glucose, Bld 12/23/2019 136* 70 - 99 mg/dL Final   BUN 12/23/2019 29* 6 - 23 mg/dL Final   Creatinine, Ser 12/23/2019 1.20  0.40 - 1.20 mg/dL Final   GFR 12/23/2019 53.93* >60.00  mL/min Final   Calcium 12/23/2019 10.7* 8.4 - 10.5 mg/dL Final   Hgb A1c MFr Bld 12/23/2019 7.2* 4.6 - 6.5 % Final   Glycemic Control Guidelines for People with Diabetes:Non Diabetic:  <6%Goal of Therapy: <7%Additional Action Suggested:  >8%     Allergies as of 12/26/2019      Reactions   Codeine    Oxycodone Hcl    Penicillins    Chlorhexidine Gluconate Rash      Medication List       Accurate as of December 26, 2019  9:01 AM. If you have any questions, ask your nurse or doctor.        STOP taking these medications   ofloxacin 0.3 % ophthalmic solution Commonly known as: OCUFLOX Stopped by: Elayne Snare, MD   prednisoLONE acetate 1 % ophthalmic suspension Commonly known as: PRED FORTE Stopped by: Elayne Snare, MD       TAKE these medications   benazepril-hydrochlorthiazide 10-12.5 MG tablet Commonly known as: LOTENSIN HCT TAKE 1 TABLET BY MOUTH  DAILY   CALCIUM PO Take by mouth 2 (two) times daily.   cloNIDine 0.2 mg/24hr patch Commonly known as: Catapres-TTS-2 Place 1 patch (0.2 mg total) onto the skin once a week.   diltiazem 360 MG 24 hr capsule Commonly known as: CARDIZEM CD TAKE 1 CAPSULE BY MOUTH  DAILY   Eliquis 5 MG Tabs tablet Generic drug: apixaban TAKE 1 TABLET BY MOUTH  TWICE DAILY   ergocalciferol 1.25 MG (50000 UT) capsule Commonly known as: VITAMIN D2 Take 50,000 Units by mouth once a week.   ferrous sulfate 325 (65 FE) MG EC tablet Take 1 tablet (325 mg total) by mouth 2 (two) times daily.   Insulin Pen Needle 32G X 4 MM Misc Commonly known as: BD Pen Needle Nano U/F USE ONE PER DAY TO INJECT  VICTOZA   metFORMIN 500 MG 24 hr tablet Commonly known as: GLUCOPHAGE-XR TAKE 4 TABLETS BY MOUTH  DAILY WITH SUPPER.   metoprolol succinate 100 MG 24 hr tablet Commonly known as: TOPROL-XL TAKE 1 TABLET BY MOUTH  DAILY WITH OR IMMEDIATELY  FOLLOWING A MEAL   OneTouch Delica Lancets Fine Misc Use to obtain a blood specimen 2 times per day dx code 250.02   OneTouch Verio IQ System w/Device Kit Use to check blood sugar 2 times per day dx code 250.02   OneTouch Verio test strip Generic drug: glucose blood USE AS DIRECTED TO CHECK  BLOOD SUGAR 2 TIMES DAILY   Ozempic (1 MG/DOSE) 2 MG/1.5ML Sopn Generic drug: Semaglutide (1 MG/DOSE) INJECT 1 MG INTO THE SKIN  ONCE A WEEK.   rosuvastatin 10 MG tablet Commonly known as: CRESTOR TAKE 1 TABLET BY MOUTH  DAILY       Allergies:  Allergies  Allergen Reactions   Codeine    Oxycodone Hcl    Penicillins    Chlorhexidine Gluconate Rash    Past Medical History:  Diagnosis Date   Atrial fibrillation ([place])    Chronic disease anemia 03/29/2011   Diabetes mellitus    Dyslipidemia    Hypertension     Obesities, morbid ([facility])    Osteoarthritis     Past Surgical History:  Procedure Laterality Date   CHOLECYSTECTOMY     REPLACEMENT TOTAL KNEE BILATERAL     SHOULDER SURGERY     RIGHT SHOULDER   TUBAL LIGATION      Family History  Problem Relation Age of Onset   Heart attack Mother  Stroke Mother    Diabetes Mother    Hypertension Brother    Breast cancer Neg Hx     Social History:  reports that she has never smoked. She has never used smokeless tobacco. She reports that she does not drink alcohol and does not use drugs.  Review of Systems:  History of atrial fibrillation: She is on Eliquis  and followed by cardiologist  Hypertension:  she has required multiple medications for control  She is currently taking diltiazem, metoprolol, Catapres 0.2 patch, Lotensin HCT 10/12.5 Her benazepril HCTZ was increased to the whole tablet on her visit in January She has higher blood pressure today, did not take her morning medications and is also in more stress  Creatinine is upper normal currently Microalbumin is normal as of 09/2019  BP Readings from Last 3 Encounters:  12/26/19 140/86  09/22/19 134/80  07/30/19 118/84   Lab Results  Component Value Date   CREATININE 1.20 12/23/2019   CREATININE 1.03 09/17/2019   CREATININE 1.07 05/19/2019     Lipids:  well controlled with Crestor 10 mg and LDL is as follows:   Lab Results  Component Value Date   CHOL 165 05/19/2019   HDL 82.90 05/19/2019   LDLCALC 72 05/19/2019   TRIG 52.0 05/19/2019   CHOLHDL 2 05/19/2019    History of autonomous multinodular goiter TSH is usually below normal with some variability, now back to normal  She has normal T3 and free T4 levels consistently   Lab Results  Component Value Date   T3FREE 3.5 12/23/2019   T3FREE 2.6 05/19/2019   T3FREE 2.8 09/12/2018   T3FREE 3.0 09/03/2017      Lab Results  Component Value Date   FREET4 1.19 12/23/2019   FREET4 1.31 05/19/2019    FREET4 1.15 09/12/2018   TSH 0.39 12/23/2019   TSH 0.29 (L) 05/19/2019   TSH 0.29 (L) 09/12/2018       Examination:   BP 140/86 (BP Location: Left Arm, Patient Position: Sitting, Cuff Size: Large)    Pulse 93    Ht $R'5\' 3"'Qy$  (1.6 m)    Wt 219 lb 9.6 oz (99.6 kg)    SpO2 99%    BMI 38.90 kg/m   Body mass index is 38.9 kg/m.   Right thyroid lobe is enlarged about 2-2-1/2 times normal, slightly firm and bosselated.  Left lobe is about 1-1/2-2 times normal  ASSESSMENT/ PLAN:   Diabetes type 2, with morbid obesity Current regimen metformin and Ozempic 1 mg doses  See history of present illness for detailed discussion of current diabetes management, blood sugar patterns and problems identified  Her A1c is 7.2 again  Although her blood sugars are only averaging 129 she likely has postprandial hyperglycemia causing her relatively increased A1c Weight has leveled off She is not able to do any exercise  She thinks she can start watching her diet better and planning her meals instead of eating fast food and on balanced meals She will stay on Metformin on Ozempic since she does not have any consistently high blood sugar patterns  LIPIDS: Has been well controlled with Crestor  HYPERTENSION:  Blood pressure is relatively better but this may be partly related to poor diet, stress and today's medications have not been taking  History of autonomous multinodular goiter: TSH is normal now   Elayne Snare 12/26/2019, 9:01 AM   Note: This office note was prepared with Dragon voice recognition system technology. Any transcriptional errors that result from this  process are unintentional.

## 2020-02-07 IMAGING — MG DIGITAL SCREENING BILATERAL MAMMOGRAM WITH TOMO AND CAD
8 series · 8 of 24 positions shown · non-contrast
Comparison: Previous exam(s).

CLINICAL DATA: Screening.

EXAM:
DIGITAL SCREENING BILATERAL MAMMOGRAM WITH TOMO AND CAD

[L MLO synth-2D]
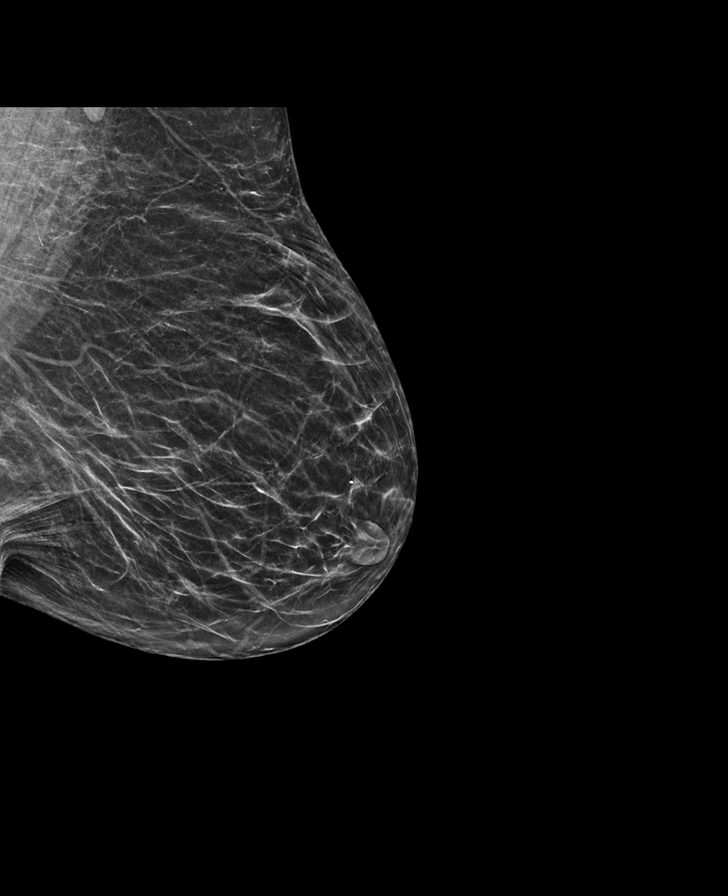

[R CC synth-2D]
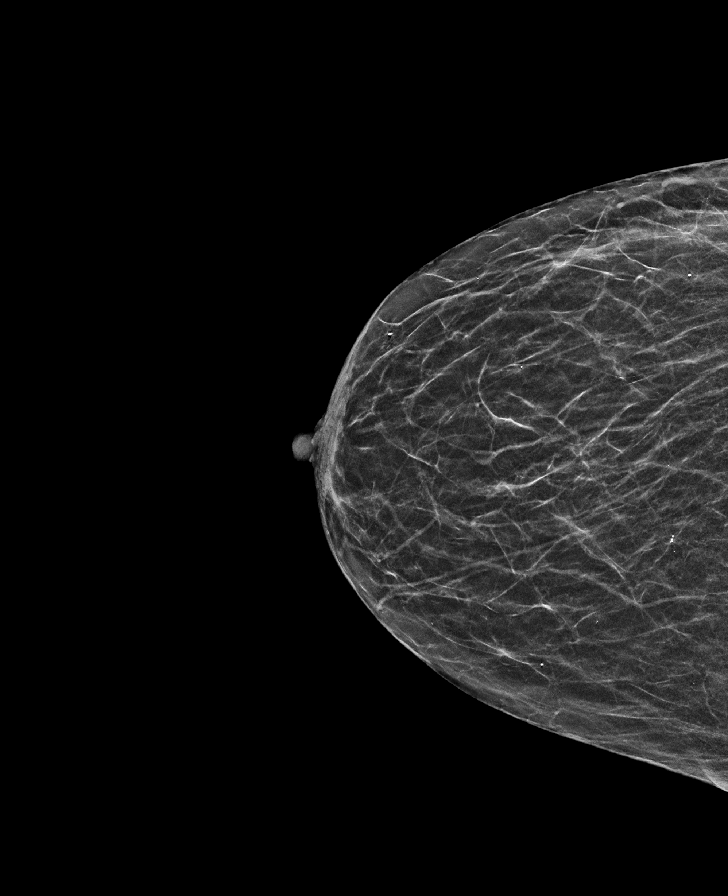

[L CC synth-2D]
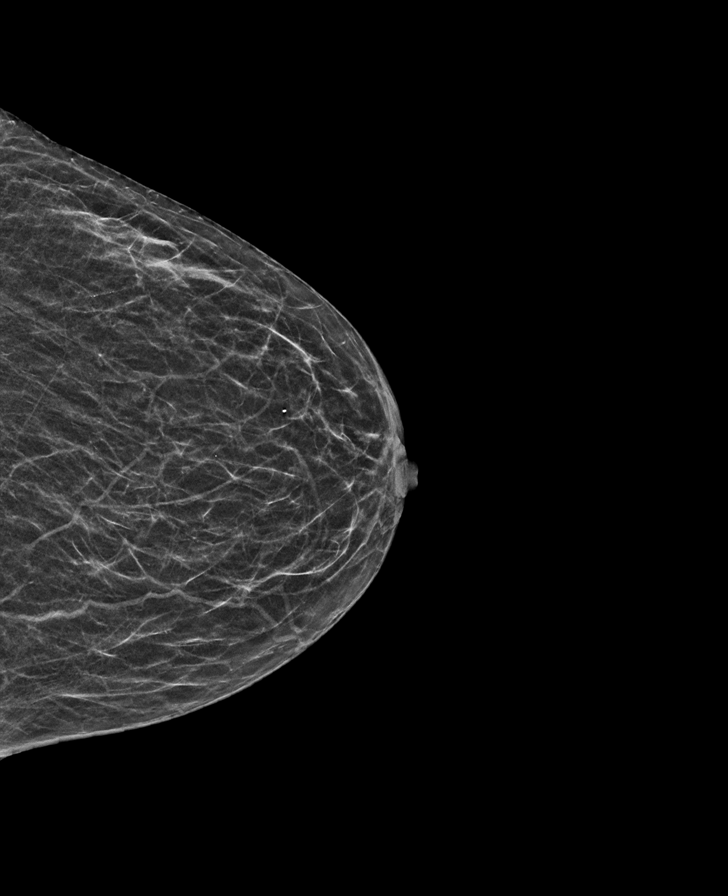

[R MLO synth-2D]
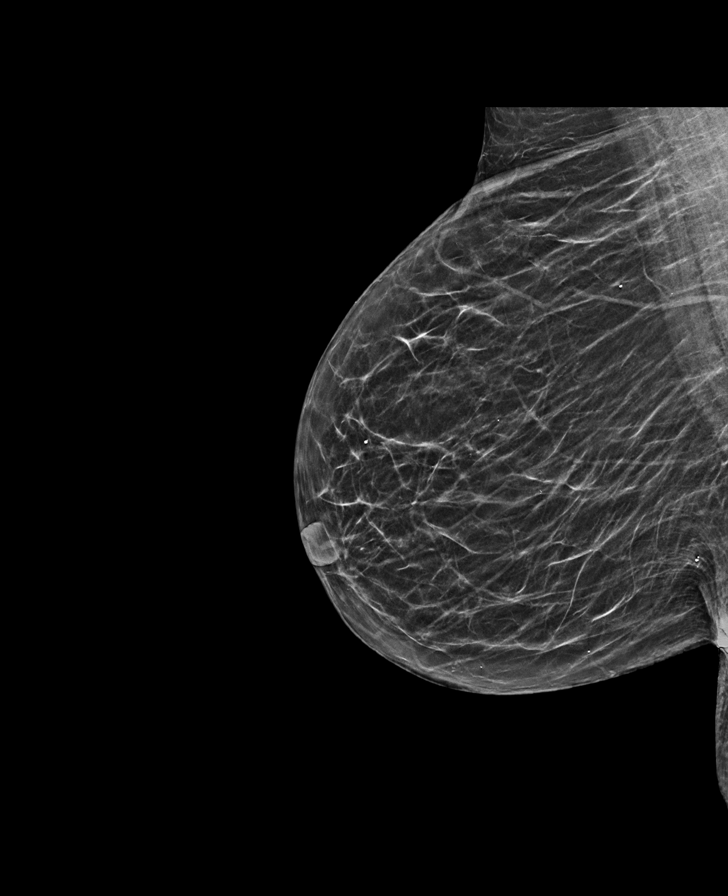

[L CC tomo · tomo slice 19/38.0]
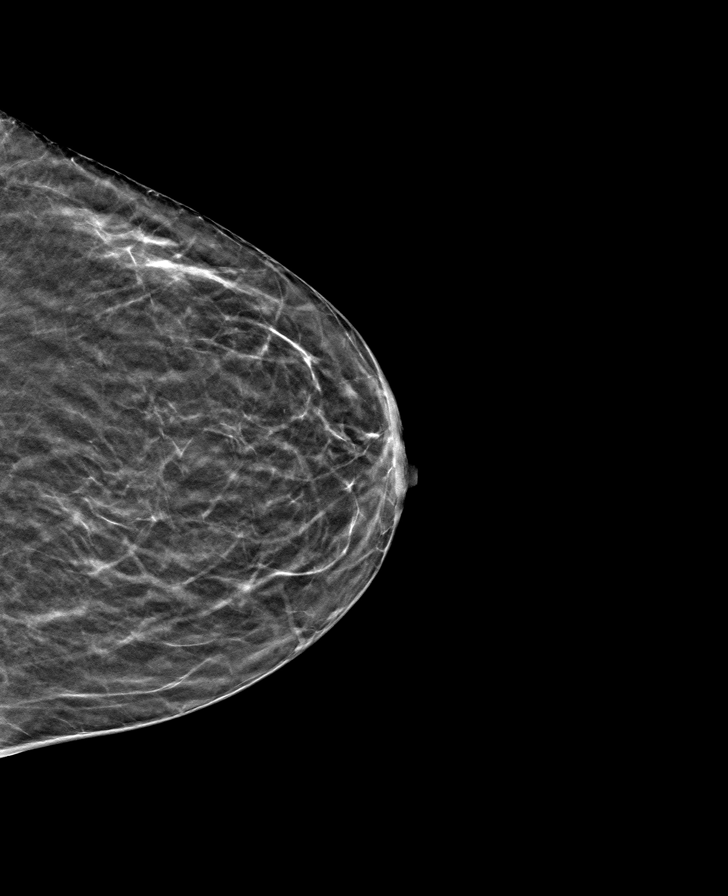

[L MLO tomo · tomo slice 25/50.0]
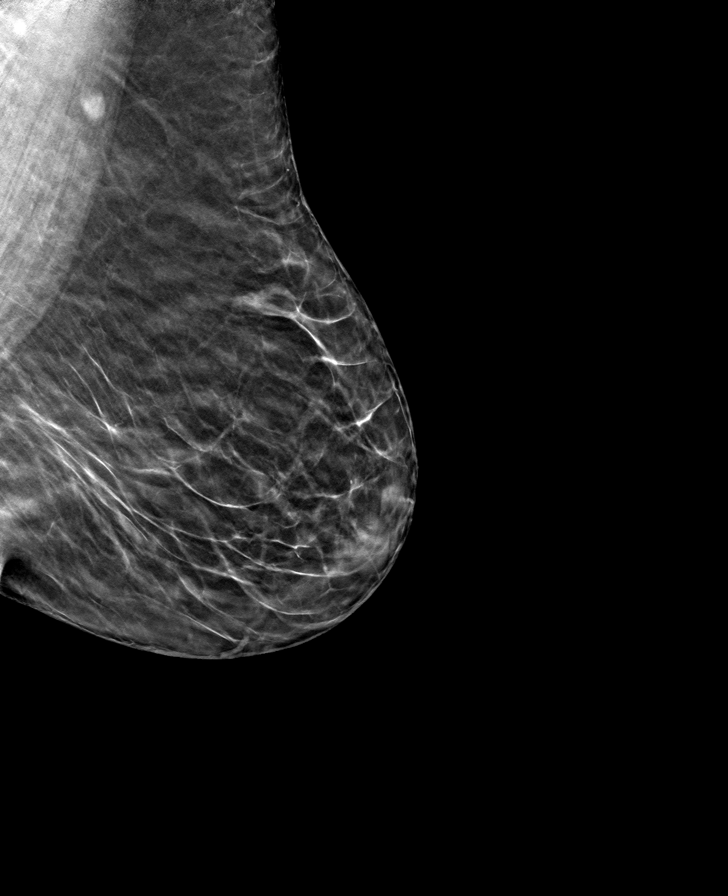

[R MLO tomo · tomo slice 24/47.0]
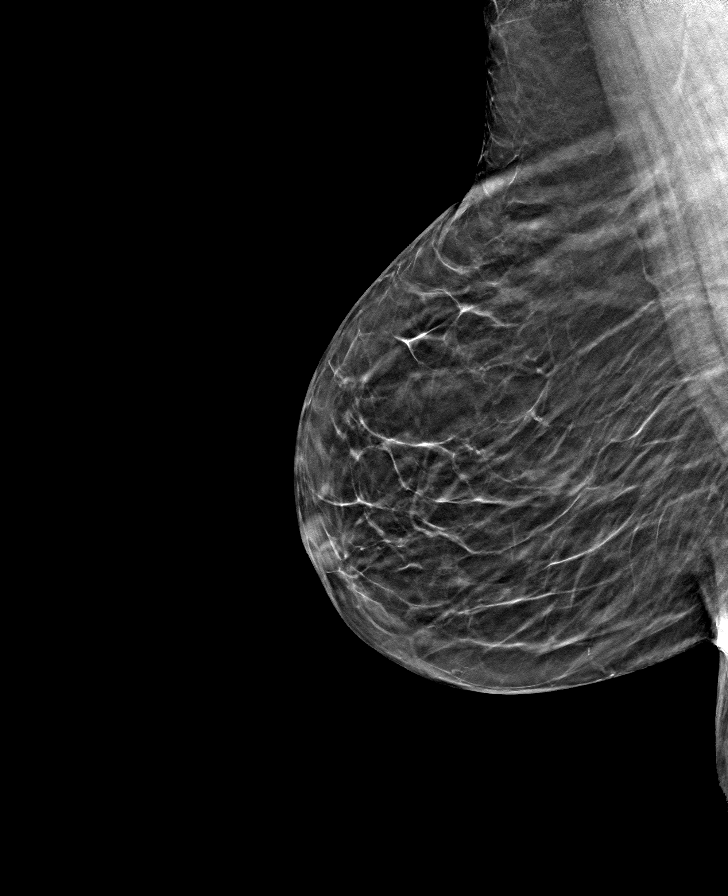

[R CC tomo · tomo slice 20/39.0]
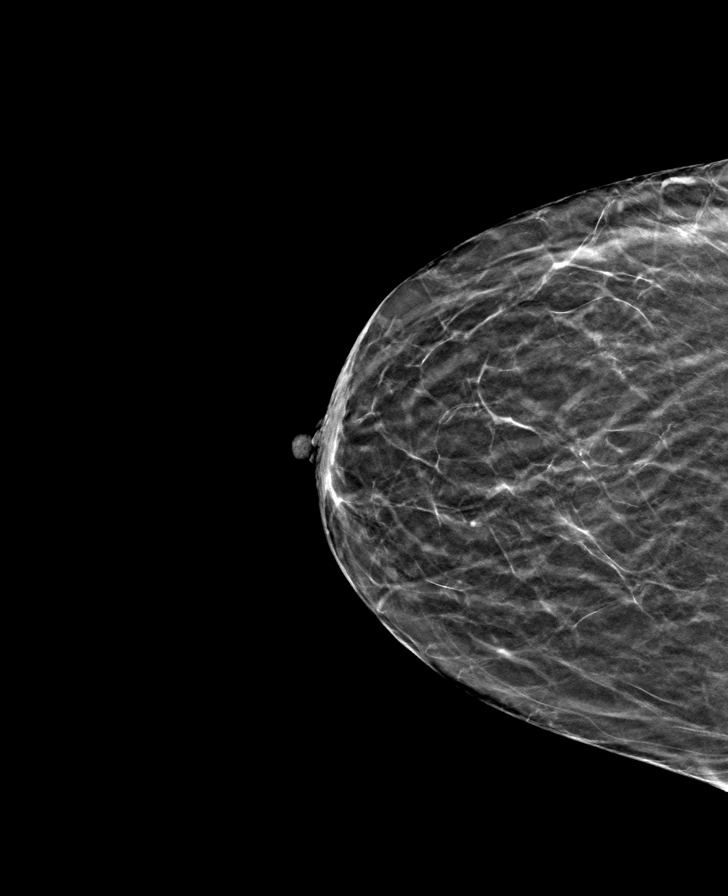

[8 of 24 positions shown; findings below may reference images not displayed]

ACR Breast Density Category b: There are scattered areas of
fibroglandular density.
FINDINGS: There are no findings suspicious for malignancy. Images were
processed with CAD.
IMPRESSION: No mammographic evidence of malignancy. A result letter of this
screening mammogram will be mailed directly to the patient.

RECOMMENDATION:
Screening mammogram in one year. (Code:[id number])

BI-RADS CATEGORY  1: Negative.

## 2020-03-12 ENCOUNTER — Other Ambulatory Visit: Payer: Self-pay | Admitting: Endocrinology

## 2020-03-16 ENCOUNTER — Other Ambulatory Visit: Payer: Self-pay | Admitting: Endocrinology

## 2020-03-23 ENCOUNTER — Other Ambulatory Visit: Payer: Self-pay

## 2020-03-23 ENCOUNTER — Other Ambulatory Visit (INDEPENDENT_AMBULATORY_CARE_PROVIDER_SITE_OTHER): Payer: Medicare Other

## 2020-03-23 DIAGNOSIS — E7800 Pure hypercholesterolemia, unspecified: Secondary | ICD-10-CM

## 2020-03-23 DIAGNOSIS — E1165 Type 2 diabetes mellitus with hyperglycemia: Secondary | ICD-10-CM | POA: Diagnosis not present

## 2020-03-23 LAB — COMPREHENSIVE METABOLIC PANEL
ALT: 21 U/L (ref 0–35)
AST: 25 U/L (ref 0–37)
Albumin: 4.1 g/dL (ref 3.5–5.2)
Alkaline Phosphatase: 99 U/L (ref 39–117)
BUN: 32 mg/dL — ABNORMAL HIGH (ref 6–23)
CO2: 27 mEq/L (ref 19–32)
Calcium: 9.4 mg/dL (ref 8.4–10.5)
Chloride: 103 mEq/L (ref 96–112)
Creatinine, Ser: 1.14 mg/dL (ref 0.40–1.20)
GFR: 49.28 mL/min — ABNORMAL LOW (ref >60.00)
Glucose, Bld: 118 mg/dL — ABNORMAL HIGH (ref 70–99)
Potassium: 3.9 mEq/L (ref 3.5–5.1)
Sodium: 137 mEq/L (ref 135–145)
Total Bilirubin: 0.5 mg/dL (ref 0.2–1.2)
Total Protein: 7.7 g/dL (ref 6.0–8.3)

## 2020-03-23 LAB — LIPID PANEL
Cholesterol: 150 mg/dL (ref 0–200)
HDL: 76.8 mg/dL (ref >39.00)
LDL Cholesterol: 62 mg/dL (ref 0–99)
NonHDL: 73.2
Total CHOL/HDL Ratio: 2
Triglycerides: 57 mg/dL (ref 0.0–149.0)
VLDL: 11.4 mg/dL (ref 0.0–40.0)

## 2020-03-23 LAB — HEMOGLOBIN A1C: Hgb A1c MFr Bld: 6.9 % — ABNORMAL HIGH (ref 4.6–6.5)

## 2020-03-26 ENCOUNTER — Encounter: Payer: Self-pay | Admitting: Endocrinology

## 2020-03-26 ENCOUNTER — Other Ambulatory Visit: Payer: Self-pay

## 2020-03-26 ENCOUNTER — Ambulatory Visit (INDEPENDENT_AMBULATORY_CARE_PROVIDER_SITE_OTHER): Payer: Medicare Other | Admitting: Endocrinology

## 2020-03-26 VITALS — BP 128/84 | HR 64 | Ht 63.0 in | Wt 224.8 lb

## 2020-03-26 DIAGNOSIS — E042 Nontoxic multinodular goiter: Secondary | ICD-10-CM

## 2020-03-26 DIAGNOSIS — E7800 Pure hypercholesterolemia, unspecified: Secondary | ICD-10-CM

## 2020-03-26 DIAGNOSIS — E1169 Type 2 diabetes mellitus with other specified complication: Secondary | ICD-10-CM

## 2020-03-26 DIAGNOSIS — I10 Essential (primary) hypertension: Secondary | ICD-10-CM

## 2020-03-26 DIAGNOSIS — E669 Obesity, unspecified: Secondary | ICD-10-CM

## 2020-03-26 DIAGNOSIS — Z23 Encounter for immunization: Secondary | ICD-10-CM

## 2020-03-26 NOTE — Patient Instructions (Signed)
More sugars after supper 

## 2020-03-26 NOTE — Progress Notes (Signed)
Patient ID: Megan Fischer, female   DOB: May 10, 1950, 69 y.o.   MRN: [id number]    Reason for Appointment: Follow-up  History of Present Illness   Diagnosis: Type 2 DIABETES MELITUS, date of diagnosis:  2000     Previous history: She has been on various regimens of treatment in the past including Byetta and generally has done better with GLP-1 drugs She continues to take low-dose glimepiride which helps with overall control. Usually blood sugars are not variable Because of occasional hypoglycemia her Amaryl was stopped in 03/2014 Was on Victoza since 2013 until 2019  Recent history: Non-insulin hypoglycemic drugs: Metformin ER 500 mg, 4 tablets at dinner, Ozempic 1.0 mg weekly, on Tuesdays   Current blood sugar patterns and problems identified:  Her A1c is 6.9 compared to 7.2  Although she is not eating out as much and eating less fast food her weight is up slightly  Because of leg pain she has been less active also  Only sporadically will have a high reading after breakfast possibly from eating more fruit  Has been quite regular with her Ozempic and Metformin and no GI side effects  Highest blood sugar 207 after breakfast  Dinner 5-6 pm     Side effects from medications: None  Monitors blood glucose: once or twice a day.    Glucometer:  One Touch   Blood sugar data with averages from monitor download and review of printout:  PRE-MEAL Fasting Lunch Dinner Bedtime Overall  Glucose range:  95-101  101-142     Mean/median:  98    118   POST-MEAL PC Breakfast PC Lunch PC Dinner  Glucose range:  120-207   104, 115  Mean/median:  164     Previously  PRE-MEAL Fasting Lunch Dinner Bedtime Overall  Glucose range:       Mean/median: 111 138   129   POST-MEAL PC Breakfast PC Lunch PC Dinner  Glucose range:   104-210  Mean/median:   141      Wt Readings from Last 3 Encounters:  03/26/20 224 lb 12.8 oz (102 kg)  12/26/19 219 lb 9.6 oz (99.6 kg)  09/22/19  219 lb 3.2 oz (99.4 kg)   Lab Results  Component Value Date   HGBA1C 6.9 (H) 03/23/2020   HGBA1C 7.2 (H) 12/23/2019   HGBA1C 7.2 (H) 09/17/2019   Lab Results  Component Value Date   MICROALBUR 12.1 (H) 09/17/2019   LDLCALC 62 03/23/2020   CREATININE 1.14 03/23/2020   Other problems discussed today: See review of systems    Lab on 03/23/2020  Component Date Value Ref Range Status  . Cholesterol 03/23/2020 150  0 - 200 mg/dL Final   ATP III Classification       Desirable:  < 200 mg/dL               Borderline High:  200 - 239 mg/dL          High:  > = 240 mg/dL  . Triglycerides 03/23/2020 57.0  0 - 149 mg/dL Final   Normal:  <150 mg/dLBorderline High:  150 - 199 mg/dL  . HDL 03/23/2020 76.80  >39.00 mg/dL Final  . VLDL 03/23/2020 11.4  0.0 - 40.0 mg/dL Final  . LDL Cholesterol 03/23/2020 62  0 - 99 mg/dL Final  . Total CHOL/HDL Ratio 03/23/2020 2   Final                  Men  Women1/2 Average Risk     3.4          3.3Average Risk          5.0          4.42X Average Risk          9.6          7.13X Average Risk          15.0          11.0                      . NonHDL 03/23/2020 73.20   Final   NOTE:  Non-HDL goal should be 30 mg/dL higher than patient's LDL goal (i.e. LDL goal of < 70 mg/dL, would have non-HDL goal of < 100 mg/dL)  . Sodium 03/23/2020 137  135 - 145 mEq/L Final  . Potassium 03/23/2020 3.9  3.5 - 5.1 mEq/L Final  . Chloride 03/23/2020 103  96 - 112 mEq/L Final  . CO2 03/23/2020 27  19 - 32 mEq/L Final  . Glucose, Bld 03/23/2020 118* 70 - 99 mg/dL Final  . BUN 03/23/2020 32* 6 - 23 mg/dL Final  . Creatinine, Ser 03/23/2020 1.14  0.40 - 1.20 mg/dL Final  . Total Bilirubin 03/23/2020 0.5  0.2 - 1.2 mg/dL Final  . Alkaline Phosphatase 03/23/2020 99  39 - 117 U/L Final  . AST 03/23/2020 25  0 - 37 U/L Final  . ALT 03/23/2020 21  0 - 35 U/L Final  . Total Protein 03/23/2020 7.7  6.0 - 8.3 g/dL Final  . Albumin 03/23/2020 4.1  3.5 - 5.2 g/dL Final  . GFR  03/23/2020 49.28* >60.00 mL/min Final   Calculated using the CKD-EPI Creatinine Equation (2021)  . Calcium 03/23/2020 9.4  8.4 - 10.5 mg/dL Final  . Hgb A1c MFr Bld 03/23/2020 6.9* 4.6 - 6.5 % Final   Glycemic Control Guidelines for People with Diabetes:Non Diabetic:  <6%Goal of Therapy: <7%Additional Action Suggested:  >8%     Allergies as of 03/26/2020      Reactions   Codeine    Oxycodone Hcl    Penicillins    Chlorhexidine Gluconate Rash      Medication List       Accurate as of March 26, 2020 10:24 AM. If you have any questions, ask your nurse or doctor.        benazepril-hydrochlorthiazide 10-12.5 MG tablet Commonly known as: LOTENSIN HCT TAKE 1 TABLET BY MOUTH  DAILY   CALCIUM PO Take by mouth 2 (two) times daily.   cloNIDine 0.2 mg/24hr patch Commonly known as: Catapres-TTS-2 Place 1 patch (0.2 mg total) onto the skin once a week.   diltiazem 360 MG 24 hr capsule Commonly known as: CARDIZEM CD TAKE 1 CAPSULE BY MOUTH  DAILY   Eliquis 5 MG Tabs tablet Generic drug: apixaban TAKE 1 TABLET BY MOUTH  TWICE DAILY   ergocalciferol 1.25 MG (50000 UT) capsule Commonly known as: VITAMIN D2 Take 50,000 Units by mouth once a week.   ferrous sulfate 325 (65 FE) MG EC tablet Take 1 tablet (325 mg total) by mouth 2 (two) times daily.   Insulin Pen Needle 32G X 4 MM Misc Commonly known as: BD Pen Needle Nano U/F USE ONE PER DAY TO INJECT  VICTOZA   metFORMIN 500 MG 24 hr tablet Commonly known as: GLUCOPHAGE-XR TAKE 4 TABLETS BY MOUTH  DAILY WITH SUPPER.   metoprolol succinate 100  MG 24 hr tablet Commonly known as: TOPROL-XL TAKE 1 TABLET BY MOUTH  DAILY WITH OR IMMEDIATELY  FOLLOWING A MEAL   OneTouch Delica Lancets Fine Misc Use to obtain a blood specimen 2 times per day dx code 250.02   OneTouch Verio IQ System w/Device Kit Use to check blood sugar 2 times per day dx code 250.02   OneTouch Verio test strip Generic drug: glucose blood USE AS DIRECTED  TO CHECK  BLOOD SUGAR 2 TIMES DAILY   Ozempic (1 MG/DOSE) 2 MG/1.5ML Sopn Generic drug: Semaglutide (1 MG/DOSE) INJECT 1 MG INTO THE SKIN  ONCE A WEEK.   Ozempic (1 MG/DOSE) 4 MG/3ML Sopn Generic drug: Semaglutide (1 MG/DOSE) INJECT 1 MG INTO THE SKIN  ONCE A WEEK.   rosuvastatin 10 MG tablet Commonly known as: CRESTOR TAKE 1 TABLET BY MOUTH  DAILY       Allergies:  Allergies  Allergen Reactions  . Codeine   . Oxycodone Hcl   . Penicillins   . Chlorhexidine Gluconate Rash    Past Medical History:  Diagnosis Date  . Atrial fibrillation ([place])   . Chronic disease anemia 03/29/2011  . Diabetes mellitus   . Dyslipidemia   . Hypertension   . Obesities, morbid ([place])   . Osteoarthritis     Past Surgical History:  Procedure Laterality Date  . CHOLECYSTECTOMY    . REPLACEMENT TOTAL KNEE BILATERAL    . SHOULDER SURGERY     RIGHT SHOULDER  . TUBAL LIGATION      Family History  Problem Relation Age of Onset  . Heart attack Mother   . Stroke Mother   . Diabetes Mother   . Hypertension Brother   . Breast cancer Neg Hx     Social History:  reports that she has never smoked. She has never used smokeless tobacco. She reports that she does not drink alcohol and does not use drugs.  Review of Systems:  History of atrial fibrillation: She is on Eliquis  and followed by cardiologist  Hypertension:  she has required multiple medications for control  She is taking diltiazem, metoprolol, Catapres 0.2 patch, Lotensin HCT 10/12.5 Blood pressure overall fairly well controlled  Creatinine is normal currently  Microalbumin is normal as of 09/2019  BP Readings from Last 3 Encounters:  03/26/20 128/84  12/26/19 140/86  09/22/19 134/80   Lab Results  Component Value Date   CREATININE 1.14 03/23/2020   CREATININE 1.20 12/23/2019   CREATININE 1.03 09/17/2019     Lipids: Consistently well controlled with Crestor 10 mg and LDL is as follows:   Lab Results  Component  Value Date   CHOL 150 03/23/2020   HDL 76.80 03/23/2020   LDLCALC 62 03/23/2020   TRIG 57.0 03/23/2020   CHOLHDL 2 03/23/2020    History of autonomous multinodular goiter TSH is previously below normal with some variability, recently back to normal  She has normal T3 and free T4 levels consistently   Lab Results  Component Value Date   T3FREE 3.5 12/23/2019   T3FREE 2.6 05/19/2019   T3FREE 2.8 09/12/2018   T3FREE 3.0 09/03/2017      Lab Results  Component Value Date   FREET4 1.19 12/23/2019   FREET4 1.31 05/19/2019   FREET4 1.15 09/12/2018   TSH 0.39 12/23/2019   TSH 0.29 (L) 05/19/2019   TSH 0.29 (L) 09/12/2018       Examination:   BP 128/84   Pulse 64   Ht _0  (1.6  m)   Wt 224 lb 12.8 oz (102 kg)   SpO2 90%   BMI 39.82 kg/m   Body mass index is 39.82 kg/m.     ASSESSMENT/ PLAN:   Diabetes type 2, with morbid obesity Current regimen is 2000 mg metformin and Ozempic 1 mg weekly  See history of present illness for detailed discussion of current diabetes management, blood sugar patterns and problems identified  Her A1c is 6.9  Although she has not lost any weight her blood sugar control appears to be a little better She is eating out less at fast food restaurants but again not exercising Most of her blood sugars at home are fairly good with only rare high sugars like after breakfast  She was recommended to continue her medications unchanged Discussed needing to check more readings after dinner Try and avoid more carbohydrates and fruits at breakfast  LIPIDS: As above, well controlled with Crestor  HYPERTENSION:  Blood pressure is generally fairly well controlled  She agrees to take the flu vaccine after explaining the benefits and reassuring her about the safety, previously had been skipping the doses   Elayne Snare 03/26/2020, 10:24 AM   Note: This office note was prepared with Dragon voice recognition system technology. Any transcriptional  errors that result from this process are unintentional.

## 2020-05-18 ENCOUNTER — Other Ambulatory Visit: Payer: Self-pay | Admitting: Endocrinology

## 2020-05-18 ENCOUNTER — Other Ambulatory Visit: Payer: Self-pay | Admitting: Cardiovascular Disease

## 2020-05-18 NOTE — Telephone Encounter (Signed)
Eliquis 5mg  refill request received. Patient is 70 years old, weight-102kg, Crea-1.14 on 03/23/2020, Diagnosis-Afib, and last seen by Dr. 03/25/2020 on 07/30/2019 and pending appt on 07/26/2020. Dose is appropriate based on dosing criteria. Will send in refill to requested pharmacy.

## 2020-07-25 ENCOUNTER — Encounter: Payer: Self-pay | Admitting: Cardiovascular Disease

## 2020-07-25 NOTE — Progress Notes (Signed)
Megan Fischer Date of Birth  Mar 11, 1951 [facility] [address] [place], [place]  [address] [phone]   Problem List 1. HTN 2. Anemia - iron deficiency 3. Diabetes Mellitus 4.   Atrial Fibrillation - [facility] 5. S/p knee replacements bilaterally 6. 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 She presents today for followup visit. She does not have any complaints.  She has lost around 65 lbs since last year.  January 29, 2018:  Megan Fischer is seen today for follow up of her atrial fibrillation.  Her atrial  Fib  is now more persistent.  She is completely asymptomatic and cannot tell when she is in atrial fibrillation or not. Has rare episodes of chest soreness.  She has occasionally woken up with chest pain.  The pains typically get worse if she moves around through the day. Is exercising some.   Chest pains are not worsened with exercise.  There is a pleuritic component to these episodes of chest pain. The pains are definitely   worsened when she twists and turns her torso. No dyspnea   Sees her primary MD regulalry  Does not eat much salt .   July 29, 2019: Megan Fischer is seen for follow up of her congestive heart failure, atrial fib ,  and hypertension.  She also has diabetes mellitus.    Has not gotten her covid vaccines yet.  Going today  No cp or dyspnea. Not much exercise . Planning to exercise. Her husband passed away last month ( parkinsons disease)  Advised some weight loss   July 26, 2020: Megan Fischer is seen for follow up of her atrial fib, HTN, diastlic CHF  Rare random episodes of CP - might last for a week  Advised weight loss Unable to exercise,  Back / leg  Has not taken her BP meds yet this am Has not had her covid vaccines yet      Current Outpatient Medications on File Prior to Visit  Medication Sig Dispense Refill  . benazepril-hydrochlorthiazide (LOTENSIN HCT) 10-12.5 MG tablet TAKE 1 TABLET BY MOUTH  DAILY 90 tablet 3  . Blood Glucose Monitoring Suppl (ONETOUCH VERIO IQ SYSTEM) W/DEVICE KIT Use to check blood sugar 2 times per day dx code 250.02 1 kit 0  . CALCIUM PO Take by mouth 2 (two) times daily.    . cloNIDine (CATAPRES-TTS-2) 0.2 mg/24hr patch Place 1 patch (0.2 mg total) onto the skin once a week. 4 patch 2  . diltiazem (CARDIZEM CD) 360 MG 24 hr capsule TAKE 1 CAPSULE BY MOUTH  DAILY 90 capsule 3  . ELIQUIS 5 MG TABS tablet TAKE 1 TABLET BY MOUTH  TWICE DAILY 180 tablet 1  . ergocalciferol (VITAMIN D2) 50000 UNITS capsule Take 50,000 Units by mouth once a week.    . ferrous sulfate 325 (65 FE) MG EC tablet Take 1 tablet (325 mg total) by mouth 2 (two) times daily. 60 tablet 3  . gabapentin (NEURONTIN) 300 MG capsule Take 2 capsules by mouth 3 (three) times daily.    . Insulin Pen Needle (BD PEN NEEDLE NANO U/F) 32G X 4 MM MISC USE ONE PER DAY TO INJECT  VICTOZA 90 each 3  . metFORMIN (GLUCOPHAGE-XR) 500 MG 24 hr tablet TAKE 4 TABLETS BY MOUTH  DAILY WITH SUPPER. 360 tablet 3  . metoprolol succinate (TOPROL-XL) 100 MG 24 hr tablet TAKE 1 TABLET BY MOUTH  DAILY WITH OR IMMEDIATELY  FOLLOWING A MEAL 90 tablet 3  . ONETOUCH DELICA LANCETS FINE MISC Use to obtain a blood specimen 2 times per day dx code 250.02 200 each 0  . ONETOUCH VERIO test strip USE AS DIRECTED TO CHECK  BLOOD SUGAR 2 TIMES DAILY 200 strip 3  . OZEMPIC, 1 MG/DOSE, 2 MG/1.5ML SOPN INJECT 1 MG INTO THE SKIN  ONCE A WEEK. 9 mL 3  . OZEMPIC, 1 MG/DOSE, 4 MG/3ML SOPN INJECT 1 MG INTO THE SKIN  ONCE A WEEK. 9 mL 3  . rosuvastatin (CRESTOR) 10 MG tablet TAKE 1 TABLET BY MOUTH  DAILY 90 tablet 3   No current facility-administered medications on file prior to visit.    Allergies  Allergen Reactions  . Codeine   . Oxycodone Hcl   .  Penicillins   . Chlorhexidine Gluconate Rash    Past Medical History:  Diagnosis Date  . Atrial fibrillation ([place])   . Chronic disease anemia 03/29/2011  . Diabetes mellitus   . Dyslipidemia   . Hypertension   . Obesities, morbid ([place])   . Osteoarthritis     Past Surgical History:  Procedure Laterality Date  . CHOLECYSTECTOMY    . REPLACEMENT TOTAL KNEE BILATERAL    . SHOULDER SURGERY     RIGHT SHOULDER  . TUBAL LIGATION      Social History   Tobacco Use  Smoking Status Never Smoker  Smokeless Tobacco Never Used    Social History   Substance and Sexual Activity  Alcohol Use No    Family History  Problem Relation Age of Onset  . Heart attack Mother   . Stroke Mother   . Diabetes Mother   . Hypertension Brother   . Breast cancer Neg Hx     Reviw of Systems:  Reviewed in the HPI.  All other systems are negative.   Physical Exam: Blood pressure 114/86, pulse 81, height _0  (1.6 m), weight 239 lb 3.2 oz (108.5 kg), SpO2 95 %.  GEN:  Middle age, morbidly obese female.   Examined in the wheelchair.  Unable to get up on the table  HEENT: Normal NECK: No JVD; No carotid bruits LYMPHATICS: No lymphadenopathy CARDIAC: irreg. Irreg.  RESPIRATORY:  Clear to auscultation without rales, wheezing or rhonchi  ABDOMEN: Soft, non-tender, non-distended MUSCULOSKELETAL:  No edema; No deformity , legs and feet are large, no edema   SKIN: Warm and dry NEUROLOGIC:  Alert and oriented x 3   ECG:    July 26, 2020:   Atrial fib at 26.  No ST or T wave changes   Assessment / Plan:   1.  Atrial fib - Has persistent Afib.  No changes.  On eliquis for [place] .  Rate is well controlled.   2.   :  Hypertension:   BP has been well controlled.   3.  Hyperlipidemia :   Managed by Dr. Ardeth Perfect  LDL in Nov, 2021 was 62.     Mertie Moores, MD  07/26/2020 8:25 AM    [facility] [place],  [address] [place], [place]  [address] Phone: [phone]; Fax: [phone]

## 2020-07-26 ENCOUNTER — Encounter (INDEPENDENT_AMBULATORY_CARE_PROVIDER_SITE_OTHER): Payer: Self-pay

## 2020-07-26 ENCOUNTER — Other Ambulatory Visit: Payer: Medicare Other

## 2020-07-26 ENCOUNTER — Encounter: Payer: Self-pay | Admitting: Cardiovascular Disease

## 2020-07-26 ENCOUNTER — Ambulatory Visit (INDEPENDENT_AMBULATORY_CARE_PROVIDER_SITE_OTHER): Payer: Medicare Other | Admitting: Cardiovascular Disease

## 2020-07-26 ENCOUNTER — Other Ambulatory Visit: Payer: Self-pay

## 2020-07-26 VITALS — BP 114/86 | HR 81 | Ht 63.0 in | Wt 239.2 lb

## 2020-07-26 DIAGNOSIS — I4891 Unspecified atrial fibrillation: Secondary | ICD-10-CM | POA: Diagnosis not present

## 2020-07-26 NOTE — Patient Instructions (Signed)
   Medication Instructions:  Your physician recommends that you continue on your current medications as directed. Please refer to the Current Medication list given to you today.  *If you need a refill on your cardiac medications before your next appointment, please call your pharmacy*   Lab Work: none If you have labs (blood work) drawn today and your tests are completely normal, you will receive your results only by: MyChart Message (if you have MyChart) OR A paper copy in the mail If you have any lab test that is abnormal or we need to change your treatment, we will call you to review the results.   Testing/Procedures: none   Follow-Up: At [facility], you and your health needs are our priority.  As part of our continuing mission to provide you with exceptional heart care, we have created designated Provider Care Teams.  These Care Teams include your primary Cardiologist (physician) and Advanced Practice Providers (APPs -  Physician Assistants and Nurse Practitioners) who all work together to provide you with the care you need, when you need it.  We recommend signing up for the patient portal called "MyChart".  Sign up information is provided on this After Visit Summary.  MyChart is used to connect with patients for Virtual Visits (Telemedicine).  Patients are able to view lab/test results, encounter notes, upcoming appointments, etc.  Non-urgent messages can be sent to your provider as well.   To learn more about what you can do with MyChart, go to [email or web address].    Your next appointment:   1 year(s)  The format for your next appointment:   In Person  Provider:   You may see Philip Nahser, MD or one of the following Advanced Practice Providers on your designated Care Team:   Scott Weaver, PA-C Vin Bhagat, PA-C 

## 2020-07-30 ENCOUNTER — Ambulatory Visit: Payer: Medicare Other | Admitting: Endocrinology

## 2020-08-06 ENCOUNTER — Telehealth: Payer: Self-pay | Admitting: Nurse Practitioner

## 2020-08-06 NOTE — Telephone Encounter (Signed)
Scheduled appt per 4/1 sch msg. Pt aware.  

## 2020-08-11 ENCOUNTER — Other Ambulatory Visit: Payer: Self-pay | Admitting: Nurse Practitioner

## 2020-08-11 DIAGNOSIS — D638 Anemia in other chronic diseases classified elsewhere: Secondary | ICD-10-CM

## 2020-08-11 NOTE — Progress Notes (Signed)
[place]   [phone] [phone]   Clinic Follow up Note   Patient Care Team: [name], MD as PCP - General (Internal Medicine) Nahser, Wonda Cheng, MD as PCP - Cardiology (Cardiology) 08/12/2020  CHIEF COMPLAINT: Follow up anemia of chronic disease  CURRENT THERAPY: Aranesp and IV iron PRN, and oral iron once daily  INTERVAL HISTORY: Megan Fischer returns for follow up. She was last seen 08/09/2018 virtually.  Last Aranesp on 09/21/2017 and iron on 10/12/2017.  She continues oral iron, tolerating well.  She denies any changes in her health since last visit.  Her baseline low energy and appetite are stable.  She cannot do much due to her leg pain and back issues.  Ambulates with a cane.  Per patient her blood sugar is controlled.  She does not drink a lot but urinates frequently.  She continues follow-up with PCP, endocrine, and oncology at [facility] for history of GCT of tibia. Denies any bleeding, cough, dyspnea, abdominal pain, recent infection. All other systems were reviewed with the patient and are negative.  MEDICAL HISTORY:  Past Medical History:  Diagnosis Date  . Atrial fibrillation ([place])   . Chronic disease anemia 03/29/2011  . Diabetes mellitus   . Dyslipidemia   . Hypertension   . Obesities, morbid ([place])   . Osteoarthritis     SURGICAL HISTORY: Past Surgical History:  Procedure Laterality Date  . CHOLECYSTECTOMY    . REPLACEMENT TOTAL KNEE BILATERAL    . SHOULDER SURGERY     RIGHT SHOULDER  . TUBAL LIGATION      I have reviewed the social history and family history with the patient and they are unchanged from previous note.  ALLERGIES:  is allergic to codeine, oxycodone hcl, penicillins, and chlorhexidine gluconate.  MEDICATIONS:  Current Outpatient Medications  Medication Sig Dispense Refill  . benazepril-hydrochlorthiazide (LOTENSIN HCT) 10-12.5 MG tablet TAKE 1 TABLET BY MOUTH  DAILY 90 tablet 3  . Blood Glucose Monitoring  Suppl (ONETOUCH VERIO IQ SYSTEM) W/DEVICE KIT Use to check blood sugar 2 times per day dx code 250.02 1 kit 0  . CALCIUM PO Take by mouth 2 (two) times daily.    . cloNIDine (CATAPRES-TTS-2) 0.2 mg/24hr patch Place 1 patch (0.2 mg total) onto the skin once a week. 4 patch 2  . diltiazem (CARDIZEM CD) 360 MG 24 hr capsule TAKE 1 CAPSULE BY MOUTH  DAILY 90 capsule 3  . ELIQUIS 5 MG TABS tablet TAKE 1 TABLET BY MOUTH  TWICE DAILY 180 tablet 1  . ergocalciferol (VITAMIN D2) 50000 UNITS capsule Take 50,000 Units by mouth once a week.    . ferrous sulfate 325 (65 FE) MG EC tablet Take 1 tablet (325 mg total) by mouth 2 (two) times daily. 60 tablet 3  . gabapentin (NEURONTIN) 300 MG capsule Take 2 capsules by mouth 3 (three) times daily.    . metFORMIN (GLUCOPHAGE-XR) 500 MG 24 hr tablet TAKE 4 TABLETS BY MOUTH  DAILY WITH SUPPER. 360 tablet 3  . metoprolol succinate (TOPROL-XL) 100 MG 24 hr tablet TAKE 1 TABLET BY MOUTH  DAILY WITH OR IMMEDIATELY  FOLLOWING A MEAL 90 tablet 3  . OZEMPIC, 1 MG/DOSE, 2 MG/1.5ML SOPN INJECT 1 MG INTO THE SKIN  ONCE A WEEK. 9 mL 3  . rosuvastatin (CRESTOR) 10 MG tablet TAKE 1 TABLET BY MOUTH  DAILY 90 tablet 3   No current facility-administered medications for this visit.    PHYSICAL EXAMINATION: Vitals:  08/12/20 0925  BP: 128/83  Pulse: 76  Resp: 20  Temp: (!) 97 F (36.1 C)  SpO2: 99%   Filed Weights   08/12/20 0925  Weight: 229 lb 6.4 oz (104.1 kg)    GENERAL:alert, no distress and comfortable SKIN: No rash EYES: sclera clear LUNGS: clear with normal breathing effort HEART: A. fib ,  No lower extremity edema NEURO: alert & oriented x 3 with fluent speech  LABORATORY DATA:  I have reviewed the data as listed CBC Latest Ref Rng & Units 08/12/2020 05/17/2018 05/17/2018  WBC 4.0 - 10.5 K/uL 5.3 6.0 -  Hemoglobin 12.0 - 15.0 g/dL 12.8 13.3 -  Hematocrit 36.0 - 46.0 % 39.3 41.2 38.6  Platelets 150 - 400 K/uL 242 233 -     CMP Latest Ref Rng & Units  08/12/2020 03/23/2020 12/23/2019  Glucose 70 - 99 mg/dL 114(H) 118(H) 136(H)  BUN 8 - 23 mg/dL 41(H) 32(H) 29(H)  Creatinine 0.44 - 1.00 mg/dL 1.59(H) 1.14 1.20  Sodium 135 - 145 mmol/L 140 137 140  Potassium 3.5 - 5.1 mmol/L 4.7 3.9 4.1  Chloride 98 - 111 mmol/L 104 103 102  CO2 22 - 32 mmol/L 24 27 28   Calcium 8.9 - 10.3 mg/dL 10.0 9.4 10.7(H)  Total Protein 6.5 - 8.1 g/dL 7.9 7.7 -  Total Bilirubin 0.3 - 1.2 mg/dL 0.5 0.5 -  Alkaline Phos 38 - 126 U/L 81 99 -  AST 15 - 41 U/L 25 25 -  ALT 0 - 44 U/L 19 21 -      RADIOGRAPHIC STUDIES: I have personally reviewed the radiological images as listed and agreed with the findings in the report. No results found.   ASSESSMENT & PLAN: 70 year old female  1. Anemia of chronic disease and h/o IDA -Initial work-up showed normal ferritin and serum iron level, slightly low iron saturation, normal I77 and folic acid.  Normal EPO, SPEP negative -Given her multiple comorbidities especially diabetes this was felt to be anemia of chronic disease -Given no other cytopenias, primary bone marrow disorder was felt to be less likely, a bone marrow biopsy has not been done -She responded well to Aranesp and Feraheme in the past, last given 2019.  Hg normalized  2.  Renal dysfunction -Lab today shows SCr 1.59, normal on 03/23/2020 at 1.14 -We discussed possible etiologies, she likely does not drink enough water.  I encouraged her to hydrate -Denies NSAID use -Meds reviewed, she is on multidrug regimen for HTN, A. fib, DM including diuretics and anticoagulation which can cause renal dysfunction.   -She states her BG has been controlled, BG 114 and BP 128/83 today -We will CC labs to PCP for further management  3. 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 Adine Madura ([place]) -followed by [facility], last seen 12/25/19 by Dr. Jackqulyn Livings, plan f/up in 1 year   4.  Health promotion and disease prevention -She  follows up regularly with PCP, cardiology, endocrine, and oncology at [facility] for GCT -We will see Dr. Dwyane Dee in May, and Dr. Ardeth Perfect PCP in June -Overdue for mammogram last done 2019, I recommend to complete it this year and continue annually.   -Colonoscopies per [place], she will check to see if she is up-to-date  Disposition: Megan Fischer is clinically doing well.  Her anemia has resolved, Hg 12.8 and normal iron panel.  Folate is pending.  She will continue oral iron, no need to resume IV  Feraheme or Aranesp injections at this time.  She does have evidence of renal dysfunction, we reviewed possible etiologies.  I encouraged her to hydrate.  Will CC labs to PCP for further monitoring and management. She may need adjustments to her current med regimen.  I encouraged her to complete age-appropriate cancer screenings this year and continue follow-up with her medical team.  We reviewed signs/symptoms of recurrent anemia.  We will see her back with labs in 1 year, or sooner if needed.  All questions were answered. The patient knows to call the clinic with any problems, questions or concerns. No barriers to learning were detected.     Megan Feeling, NP 08/12/20

## 2020-08-12 ENCOUNTER — Telehealth: Payer: Self-pay

## 2020-08-12 ENCOUNTER — Encounter: Payer: Self-pay | Admitting: Nurse Practitioner

## 2020-08-12 ENCOUNTER — Telehealth: Payer: Self-pay | Admitting: Nurse Practitioner

## 2020-08-12 ENCOUNTER — Inpatient Hospital Stay: Payer: Medicare Other | Attending: Nurse Practitioner | Admitting: Nurse Practitioner

## 2020-08-12 ENCOUNTER — Inpatient Hospital Stay: Payer: Medicare Other

## 2020-08-12 ENCOUNTER — Other Ambulatory Visit: Payer: Self-pay

## 2020-08-12 VITALS — BP 128/83 | HR 76 | Temp 97.0°F | Resp 20 | Ht 63.0 in | Wt 229.4 lb

## 2020-08-12 DIAGNOSIS — D638 Anemia in other chronic diseases classified elsewhere: Secondary | ICD-10-CM

## 2020-08-12 DIAGNOSIS — N289 Disorder of kidney and ureter, unspecified: Secondary | ICD-10-CM | POA: Diagnosis not present

## 2020-08-12 DIAGNOSIS — Z7984 Long term (current) use of oral hypoglycemic drugs: Secondary | ICD-10-CM | POA: Insufficient documentation

## 2020-08-12 DIAGNOSIS — E119 Type 2 diabetes mellitus without complications: Secondary | ICD-10-CM | POA: Diagnosis present

## 2020-08-12 DIAGNOSIS — D509 Iron deficiency anemia, unspecified: Secondary | ICD-10-CM

## 2020-08-12 LAB — COMPREHENSIVE METABOLIC PANEL WITH GFR
ALT: 19 U/L (ref 0–44)
AST: 25 U/L (ref 15–41)
Albumin: 3.9 g/dL (ref 3.5–5.0)
Alkaline Phosphatase: 81 U/L (ref 38–126)
Anion gap: 12 (ref 5–15)
BUN: 41 mg/dL — ABNORMAL HIGH (ref 8–23)
CO2: 24 mmol/L (ref 22–32)
Calcium: 10 mg/dL (ref 8.9–10.3)
Chloride: 104 mmol/L (ref 98–111)
Creatinine, Ser: 1.59 mg/dL — ABNORMAL HIGH (ref 0.44–1.00)
GFR, Estimated: 35 mL/min — ABNORMAL LOW (ref >60)
Glucose, Bld: 114 mg/dL — ABNORMAL HIGH (ref 70–99)
Potassium: 4.7 mmol/L (ref 3.5–5.1)
Sodium: 140 mmol/L (ref 135–145)
Total Bilirubin: 0.5 mg/dL (ref 0.3–1.2)
Total Protein: 7.9 g/dL (ref 6.5–8.1)

## 2020-08-12 LAB — CBC WITH DIFFERENTIAL (CANCER CENTER ONLY)
Abs Immature Granulocytes: 0.01 10*3/uL (ref 0.00–0.07)
Basophils Absolute: 0 10*3/uL (ref 0.0–0.1)
Basophils Relative: 1 %
Eosinophils Absolute: 0.1 10*3/uL (ref 0.0–0.5)
Eosinophils Relative: 3 %
HCT: 39.3 % (ref 36.0–46.0)
Hemoglobin: 12.8 g/dL (ref 12.0–15.0)
Immature Granulocytes: 0 %
Lymphocytes Relative: 35 %
Lymphs Abs: 1.8 10*3/uL (ref 0.7–4.0)
MCH: 28.6 pg (ref 26.0–34.0)
MCHC: 32.6 g/dL (ref 30.0–36.0)
MCV: 87.7 fL (ref 80.0–100.0)
Monocytes Absolute: 0.3 10*3/uL (ref 0.1–1.0)
Monocytes Relative: 6 %
Neutro Abs: 3 10*3/uL (ref 1.7–7.7)
Neutrophils Relative %: 55 %
Platelet Count: 242 10*3/uL (ref 150–400)
RBC: 4.48 MIL/uL (ref 3.87–5.11)
RDW: 15.6 % — ABNORMAL HIGH (ref 11.5–15.5)
WBC Count: 5.3 10*3/uL (ref 4.0–10.5)
nRBC: 0 % (ref 0.0–0.2)

## 2020-08-12 LAB — FERRITIN: Ferritin: 142 ng/mL (ref 11–307)

## 2020-08-12 LAB — IRON AND TIBC
Iron: 85 ug/dL (ref 41–142)
Saturation Ratios: 26 % (ref 21–57)
TIBC: 330 ug/dL (ref 236–444)
UIBC: 245 ug/dL (ref 120–384)

## 2020-08-12 NOTE — Telephone Encounter (Signed)
I spoke with Megan Fischer to make her aware of her lab results and she verbalized understanding. Per Santiago Glad, NP, I have faxed a copy of the patient's office notes and labs from today to Dr. Link Snuffer to follow up new renal dysfunction at [phone] with receipt of confirmation.

## 2020-08-12 NOTE — Telephone Encounter (Signed)
Scheduled per los. Gave avs and calendar  

## 2020-08-12 NOTE — Telephone Encounter (Signed)
-----   Message from Pollyann Samples, NP sent at 08/12/2020  1:45 PM EDT ----- Please let her know iron labs are normal and as we discussed, she is not anemic. No need to restart epo injections or IV iron at this time. Her labs will be sent to PCP for f/up new renal dysfunction.   Thanks, Clayborn Heron, NP

## 2020-08-13 LAB — FOLATE RBC
Folate, Hemolysate: 466 ng/mL
Folate, RBC: 1204 ng/mL (ref >498)
Hematocrit: 38.7 % (ref 34.0–46.6)

## 2020-09-02 ENCOUNTER — Other Ambulatory Visit: Payer: Self-pay

## 2020-09-02 ENCOUNTER — Other Ambulatory Visit (INDEPENDENT_AMBULATORY_CARE_PROVIDER_SITE_OTHER): Payer: Medicare Other

## 2020-09-02 DIAGNOSIS — E042 Nontoxic multinodular goiter: Secondary | ICD-10-CM

## 2020-09-02 DIAGNOSIS — E1169 Type 2 diabetes mellitus with other specified complication: Secondary | ICD-10-CM

## 2020-09-02 DIAGNOSIS — E669 Obesity, unspecified: Secondary | ICD-10-CM | POA: Diagnosis not present

## 2020-09-02 LAB — T3, FREE: T3, Free: 2.8 pg/mL (ref 2.3–4.2)

## 2020-09-02 LAB — HEMOGLOBIN A1C: Hgb A1c MFr Bld: 7.5 % — ABNORMAL HIGH (ref 4.6–6.5)

## 2020-09-02 LAB — BASIC METABOLIC PANEL
BUN: 30 mg/dL — ABNORMAL HIGH (ref 6–23)
CO2: 30 mEq/L (ref 19–32)
Calcium: 9.9 mg/dL (ref 8.4–10.5)
Chloride: 103 mEq/L (ref 96–112)
Creatinine, Ser: 1.26 mg/dL — ABNORMAL HIGH (ref 0.40–1.20)
GFR: 43.56 mL/min — ABNORMAL LOW (ref >60.00)
Glucose, Bld: 117 mg/dL — ABNORMAL HIGH (ref 70–99)
Potassium: 3.9 mEq/L (ref 3.5–5.1)
Sodium: 142 mEq/L (ref 135–145)

## 2020-09-02 LAB — T4, FREE: Free T4: 1.07 ng/dL (ref 0.60–1.60)

## 2020-09-02 LAB — TSH: TSH: 0.19 u[IU]/mL — ABNORMAL LOW (ref 0.35–4.50)

## 2020-09-05 NOTE — Progress Notes (Signed)
Patient ID: Megan Fischer, female   DOB: 09-18-1950, 70 y.o.   MRN: [id number]    Reason for Appointment: Follow-up  History of Present Illness   Diagnosis: Type 2 DIABETES MELITUS, date of diagnosis:  2000     Previous history: She has been on various regimens of treatment in the past including Byetta and generally has done better with GLP-1 drugs She continues to take low-dose glimepiride which helps with overall control. Usually blood sugars are not variable Because of occasional hypoglycemia her Amaryl was stopped in 03/2014 Was on Victoza since 2013 until 2019  Recent history: Non-insulin hypoglycemic drugs: Metformin ER 500 mg, 4 tablets at dinner, Ozempic 1.0 mg weekly, on Tuesdays   Current blood sugar patterns and problems identified:   Her A1c is higher than usual at 7.5, was 6.9  Last visit was in 11/21  She also has gained 6 pounds  She does not think she has changed her diet and is not getting too many snacks  Most of her high sugars appear to be fasting  She has not missed any doses of Ozempic  Unable to exercise because of leg pain  Highest blood sugar 184 in the morning  However lab glucose has not been high recently  Dinner 5-6 pm     Side effects from medications: None  Monitors blood glucose: once or twice a day.    Glucometer:  One Touch   Blood sugar data with averages from monitor download and review of printout:   PRE-MEAL Fasting Lunch Dinner Bedtime Overall  Glucose range:  111-184  93-139     Mean/median:  142    127   POST-MEAL PC Breakfast PC Lunch PC Dinner  Glucose range:    116, 150  Mean/median:      Previously:  PRE-MEAL Fasting Lunch Dinner Bedtime Overall  Glucose range:  95-101  101-142     Mean/median:  98    118   POST-MEAL PC Breakfast PC Lunch PC Dinner  Glucose range:  120-207   104, 115  Mean/median:  164        Wt Readings from Last 3 Encounters:  09/06/20 230 lb 3.2 oz (104.4 kg)  08/12/20  229 lb 6.4 oz (104.1 kg)  07/26/20 239 lb 3.2 oz (108.5 kg)   Lab Results  Component Value Date   HGBA1C 7.5 (H) 09/02/2020   HGBA1C 6.9 (H) 03/23/2020   HGBA1C 7.2 (H) 12/23/2019   Lab Results  Component Value Date   MICROALBUR 12.1 (H) 09/17/2019   LDLCALC 62 03/23/2020   CREATININE 1.26 (H) 09/02/2020   Other problems discussed today: See review of systems    Lab on 09/02/2020  Component Date Value Ref Range Status  . T3, Free 09/02/2020 2.8  2.3 - 4.2 pg/mL Final  . Free T4 09/02/2020 1.07  0.60 - 1.60 ng/dL Final   Comment: Specimens from patients who are undergoing biotin therapy and /or ingesting biotin supplements may contain high levels of biotin.  The higher biotin concentration in these specimens interferes with this Free T4 assay.  Specimens that contain high levels  of biotin may cause false high results for this Free T4 assay.  Please interpret results in light of the total clinical presentation of the patient.    Marland Kitchen TSH 09/02/2020 0.19* 0.35 - 4.50 uIU/mL Final  . Sodium 09/02/2020 142  135 - 145 mEq/L Final  . Potassium 09/02/2020 3.9  3.5 - 5.1 mEq/L Final  .  Chloride 09/02/2020 103  96 - 112 mEq/L Final  . CO2 09/02/2020 30  19 - 32 mEq/L Final  . Glucose, Bld 09/02/2020 117* 70 - 99 mg/dL Final  . BUN 09/02/2020 30* 6 - 23 mg/dL Final  . Creatinine, Ser 09/02/2020 1.26* 0.40 - 1.20 mg/dL Final  . GFR 09/02/2020 43.56* >60.00 mL/min Final   Calculated using the CKD-EPI Creatinine Equation (2021)  . Calcium 09/02/2020 9.9  8.4 - 10.5 mg/dL Final  . Hgb A1c MFr Bld 09/02/2020 7.5* 4.6 - 6.5 % Final   Glycemic Control Guidelines for People with Diabetes:Non Diabetic:  <6%Goal of Therapy: <7%Additional Action Suggested:  >8%     Allergies as of 09/06/2020      Reactions   Codeine    Oxycodone Hcl    Penicillins    Chlorhexidine Gluconate Rash      Medication List       Accurate as of Sep 06, 2020  8:19 AM. If you have any questions, ask your nurse or  doctor.        benazepril-hydrochlorthiazide 10-12.5 MG tablet Commonly known as: LOTENSIN HCT TAKE 1 TABLET BY MOUTH  DAILY   CALCIUM PO Take by mouth 2 (two) times daily.   cloNIDine 0.2 mg/24hr patch Commonly known as: Catapres-TTS-2 Place 1 patch (0.2 mg total) onto the skin once a week.   diltiazem 360 MG 24 hr capsule Commonly known as: CARDIZEM CD TAKE 1 CAPSULE BY MOUTH  DAILY   Eliquis 5 MG Tabs tablet Generic drug: apixaban TAKE 1 TABLET BY MOUTH  TWICE DAILY   ergocalciferol 1.25 MG (50000 UT) capsule Commonly known as: VITAMIN D2 Take 50,000 Units by mouth once a week.   ferrous sulfate 325 (65 FE) MG EC tablet Take 1 tablet (325 mg total) by mouth 2 (two) times daily.   gabapentin 300 MG capsule Commonly known as: NEURONTIN Take 2 capsules by mouth 3 (three) times daily.   metFORMIN 500 MG 24 hr tablet Commonly known as: GLUCOPHAGE-XR TAKE 4 TABLETS BY MOUTH  DAILY WITH SUPPER.   metoprolol succinate 100 MG 24 hr tablet Commonly known as: TOPROL-XL TAKE 1 TABLET BY MOUTH  DAILY WITH OR IMMEDIATELY  FOLLOWING A MEAL   OneTouch Verio IQ System w/Device Kit Use to check blood sugar 2 times per day dx code 250.02   Ozempic (1 MG/DOSE) 2 MG/1.5ML Sopn Generic drug: Semaglutide (1 MG/DOSE) INJECT 1 MG INTO THE SKIN  ONCE A WEEK.   rosuvastatin 10 MG tablet Commonly known as: CRESTOR TAKE 1 TABLET BY MOUTH  DAILY       Allergies:  Allergies  Allergen Reactions  . Codeine   . Oxycodone Hcl   . Penicillins   . Chlorhexidine Gluconate Rash    Past Medical History:  Diagnosis Date  . Atrial fibrillation ([place])   . Chronic disease anemia 03/29/2011  . Diabetes mellitus   . Dyslipidemia   . Hypertension   . Obesities, morbid ([place])   . Osteoarthritis     Past Surgical History:  Procedure Laterality Date  . CHOLECYSTECTOMY    . REPLACEMENT TOTAL KNEE BILATERAL    . SHOULDER SURGERY     RIGHT SHOULDER  . TUBAL LIGATION      Family  History  Problem Relation Age of Onset  . Heart attack Mother   . Stroke Mother   . Diabetes Mother   . Hypertension Brother   . Breast cancer Neg Hx     Social History:  reports that she has never smoked. She has never used smokeless tobacco. She reports that she does not drink alcohol and does not use drugs.  Review of Systems:  History of atrial fibrillation: She is on Eliquis  and followed by cardiologist  Hypertension:  she has required multiple medications for control  She is taking diltiazem, metoprolol, Catapres 0.2 patch, Lotensin HCT 10/12.5 Blood pressure overall fairly well controlled  Creatinine is variable, slightly better compared to earlier this year  Microalbumin is normal as of 09/2019  BP Readings from Last 3 Encounters:  09/06/20 132/82  08/12/20 128/83  07/26/20 114/86   Lab Results  Component Value Date   CREATININE 1.26 (H) 09/02/2020   CREATININE 1.59 (H) 08/12/2020   CREATININE 1.14 03/23/2020     Lipids: Consistently well controlled with Crestor 10 mg and LDL is as follows:   Lab Results  Component Value Date   CHOL 150 03/23/2020   HDL 76.80 03/23/2020   LDLCALC 62 03/23/2020   TRIG 57.0 03/23/2020   CHOLHDL 2 03/23/2020    History of autonomous multinodular goiter TSH is still low normal or slightly low  She has normal T3 and free T4 levels consistently   Lab Results  Component Value Date   T3FREE 2.8 09/02/2020   T3FREE 3.5 12/23/2019   T3FREE 2.6 05/19/2019   T3FREE 2.8 09/12/2018      Lab Results  Component Value Date   FREET4 1.07 09/02/2020   FREET4 1.19 12/23/2019   FREET4 1.31 05/19/2019   TSH 0.19 (L) 09/02/2020   TSH 0.39 12/23/2019   TSH 0.29 (L) 05/19/2019   She is asking about left hand being numb, especially in the morning    Examination:   BP 132/82   Pulse (!) 50   Ht _0  (1.6 m)   Wt 230 lb 3.2 oz (104.4 kg)   SpO2 97%   BMI 40.78 kg/m   Body mass index is 40.78 kg/m.   Tinel's sign  positive on the left  ASSESSMENT/ PLAN:   Diabetes type 2, with morbid obesity Current regimen is 2000 mg metformin and Ozempic 1 mg weekly  See history of present illness for detailed discussion of current diabetes management, blood sugar patterns and problems identified  Her A1c is 7.5 compared to 6.9  Weight has gone up and fasting readings are higher Not clear why she is having more difficulty with weight gain and hyperglycemia  Since her blood sugars are not consistently high may benefit from a higher dose of Ozempic and some weight loss She will try 2 mg dose on her Ozempic next week and if tolerated call for the 2 mg weekly prescription  Carpal tunnel syndrome: She will try wrist splint on the left  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 Any transcriptional errors that result from this process are unintentional.

## 2020-09-06 ENCOUNTER — Encounter: Payer: Self-pay | Admitting: Endocrinology

## 2020-09-06 ENCOUNTER — Ambulatory Visit (INDEPENDENT_AMBULATORY_CARE_PROVIDER_SITE_OTHER): Payer: Medicare Other | Admitting: Endocrinology

## 2020-09-06 ENCOUNTER — Telehealth: Payer: Self-pay | Admitting: Endocrinology

## 2020-09-06 ENCOUNTER — Other Ambulatory Visit: Payer: Self-pay

## 2020-09-06 VITALS — BP 132/82 | HR 50 | Ht 63.0 in | Wt 230.2 lb

## 2020-09-06 DIAGNOSIS — I10 Essential (primary) hypertension: Secondary | ICD-10-CM

## 2020-09-06 DIAGNOSIS — R7989 Other specified abnormal findings of blood chemistry: Secondary | ICD-10-CM

## 2020-09-06 DIAGNOSIS — E7800 Pure hypercholesterolemia, unspecified: Secondary | ICD-10-CM

## 2020-09-06 DIAGNOSIS — E1165 Type 2 diabetes mellitus with hyperglycemia: Secondary | ICD-10-CM | POA: Diagnosis not present

## 2020-09-06 NOTE — Telephone Encounter (Signed)
Spoke with patient and she requested that we mail the RX for Wrist Splint to her at:  [address] [place] [address]

## 2020-09-06 NOTE — Patient Instructions (Addendum)
Take 2 shots of 1mg  on 10th  Next dose will be 2mg  weekly  Check blood sugars on waking up    Also check blood sugars about 2 hours after meals and do this after different meals by rotation  Recommended blood sugar levels on waking up are 90-130 and about 2 hours after meal is 130-160  Please bring your blood sugar monitor to each visit, thank you

## 2020-09-06 NOTE — Telephone Encounter (Signed)
Noted,  Thank you!

## 2020-09-16 ENCOUNTER — Telehealth: Payer: Self-pay | Admitting: Endocrinology

## 2020-09-16 NOTE — Telephone Encounter (Signed)
Patient called to let know that the Ozempic 2 mg does not bother Patient's stomach, therefore, Patient requests a new RX for Ozempic 2 MG be sent to: [facility] [place], [place] - [address] [place], [address] Phone:  [phone]  Fax:  [phone]

## 2020-09-20 ENCOUNTER — Other Ambulatory Visit: Payer: Self-pay | Admitting: *Deleted

## 2020-09-20 MED ORDER — OZEMPIC (1 MG/DOSE) 2 MG/1.5ML ~~LOC~~ SOPN: 1.0000 mg | PEN_INJECTOR | SUBCUTANEOUS | 3 refills | Status: DC

## 2020-09-20 NOTE — Telephone Encounter (Signed)
Patient called to checked status of Ozempic RX - per patient pharmacy has not received an RX from our office for the Ozempic 2 MG  Call back # [phone]

## 2020-09-20 NOTE — Telephone Encounter (Signed)
Rx sent 

## 2020-09-30 NOTE — Telephone Encounter (Signed)
Patient called and is requesting call back with dosing amounts and instructions for Ozempic

## 2020-10-11 NOTE — Telephone Encounter (Signed)
Tried to call patient but phone kept ringing. Will try again. 

## 2020-10-13 NOTE — Telephone Encounter (Signed)
Tried to call patient but phone kept ringing. Will try again. 

## 2020-11-12 ENCOUNTER — Emergency Department (HOSPITAL_BASED_OUTPATIENT_CLINIC_OR_DEPARTMENT_OTHER): Payer: Medicare Other

## 2020-11-12 ENCOUNTER — Other Ambulatory Visit: Payer: Self-pay

## 2020-11-12 ENCOUNTER — Emergency Department (HOSPITAL_BASED_OUTPATIENT_CLINIC_OR_DEPARTMENT_OTHER)
Admission: EM | Admit: 2020-11-12 | Discharge: 2020-11-12 | Disposition: A | Discharge status: Home / Self Care | Payer: Medicare Other | Attending: Emergency Medicine | Admitting: Emergency Medicine

## 2020-11-12 ENCOUNTER — Encounter (HOSPITAL_BASED_OUTPATIENT_CLINIC_OR_DEPARTMENT_OTHER): Payer: Self-pay | Admitting: Emergency Medicine

## 2020-11-12 DIAGNOSIS — Z96653 Presence of artificial knee joint, bilateral: Secondary | ICD-10-CM | POA: Insufficient documentation

## 2020-11-12 DIAGNOSIS — R109 Unspecified abdominal pain: Secondary | ICD-10-CM

## 2020-11-12 DIAGNOSIS — Z7984 Long term (current) use of oral hypoglycemic drugs: Secondary | ICD-10-CM | POA: Diagnosis not present

## 2020-11-12 DIAGNOSIS — Z7901 Long term (current) use of anticoagulants: Secondary | ICD-10-CM | POA: Insufficient documentation

## 2020-11-12 DIAGNOSIS — Z20822 Contact with and (suspected) exposure to covid-19: Secondary | ICD-10-CM | POA: Diagnosis not present

## 2020-11-12 DIAGNOSIS — R600 Localized edema: Secondary | ICD-10-CM | POA: Diagnosis not present

## 2020-11-12 DIAGNOSIS — Z79899 Other long term (current) drug therapy: Secondary | ICD-10-CM | POA: Insufficient documentation

## 2020-11-12 DIAGNOSIS — R0789 Other chest pain: Secondary | ICD-10-CM | POA: Diagnosis not present

## 2020-11-12 DIAGNOSIS — I4891 Unspecified atrial fibrillation: Secondary | ICD-10-CM | POA: Insufficient documentation

## 2020-11-12 DIAGNOSIS — E119 Type 2 diabetes mellitus without complications: Secondary | ICD-10-CM | POA: Insufficient documentation

## 2020-11-12 DIAGNOSIS — R0602 Shortness of breath: Secondary | ICD-10-CM | POA: Insufficient documentation

## 2020-11-12 DIAGNOSIS — Z794 Long term (current) use of insulin: Secondary | ICD-10-CM | POA: Insufficient documentation

## 2020-11-12 DIAGNOSIS — I10 Essential (primary) hypertension: Secondary | ICD-10-CM | POA: Insufficient documentation

## 2020-11-12 LAB — CBC WITH DIFFERENTIAL/PLATELET
Abs Immature Granulocytes: 0.03 10*3/uL (ref 0.00–0.07)
Basophils Absolute: 0 10*3/uL (ref 0.0–0.1)
Basophils Relative: 0 %
Eosinophils Absolute: 0.3 10*3/uL (ref 0.0–0.5)
Eosinophils Relative: 3 %
HCT: 34.1 % — ABNORMAL LOW (ref 36.0–46.0)
Hemoglobin: 11 g/dL — ABNORMAL LOW (ref 12.0–15.0)
Immature Granulocytes: 0 %
Lymphocytes Relative: 24 %
Lymphs Abs: 2.1 10*3/uL (ref 0.7–4.0)
MCH: 29.3 pg (ref 26.0–34.0)
MCHC: 32.3 g/dL (ref 30.0–36.0)
MCV: 90.7 fL (ref 80.0–100.0)
Monocytes Absolute: 0.5 10*3/uL (ref 0.1–1.0)
Monocytes Relative: 6 %
Neutro Abs: 5.8 10*3/uL (ref 1.7–7.7)
Neutrophils Relative %: 67 %
Platelets: 246 10*3/uL (ref 150–400)
RBC: 3.76 MIL/uL — ABNORMAL LOW (ref 3.87–5.11)
RDW: 15.9 % — ABNORMAL HIGH (ref 11.5–15.5)
WBC: 8.7 10*3/uL (ref 4.0–10.5)
nRBC: 0 % (ref 0.0–0.2)

## 2020-11-12 LAB — RESP PANEL BY RT-PCR (FLU A&B, COVID) ARPGX2
Influenza A by PCR: NEGATIVE
Influenza B by PCR: NEGATIVE
SARS Coronavirus 2 by RT PCR: NEGATIVE

## 2020-11-12 LAB — COMPREHENSIVE METABOLIC PANEL
ALT: 18 U/L (ref 0–44)
AST: 37 U/L (ref 15–41)
Albumin: 3.9 g/dL (ref 3.5–5.0)
Alkaline Phosphatase: 77 U/L (ref 38–126)
Anion gap: 9 (ref 5–15)
BUN: 33 mg/dL — ABNORMAL HIGH (ref 8–23)
CO2: 25 mmol/L (ref 22–32)
Calcium: 9.9 mg/dL (ref 8.9–10.3)
Chloride: 103 mmol/L (ref 98–111)
Creatinine, Ser: 1.26 mg/dL — ABNORMAL HIGH (ref 0.44–1.00)
GFR, Estimated: 46 mL/min — ABNORMAL LOW (ref >60)
Glucose, Bld: 148 mg/dL — ABNORMAL HIGH (ref 70–99)
Potassium: 4.2 mmol/L (ref 3.5–5.1)
Sodium: 137 mmol/L (ref 135–145)
Total Bilirubin: 1 mg/dL (ref 0.3–1.2)
Total Protein: 8 g/dL (ref 6.5–8.1)

## 2020-11-12 LAB — TROPONIN I (HIGH SENSITIVITY): Troponin I (High Sensitivity): 17 ng/L (ref <18)

## 2020-11-12 LAB — BRAIN NATRIURETIC PEPTIDE: B Natriuretic Peptide: 467.8 pg/mL — ABNORMAL HIGH (ref 0.0–100.0)

## 2020-11-12 MED ORDER — FENTANYL CITRATE (PF) 100 MCG/2ML IJ SOLN
50.0000 ug | Freq: Once | INTRAMUSCULAR | Status: AC
  Administered 2020-11-12: 50 ug via INTRAVENOUS
  Filled 2020-11-12: qty 2

## 2020-11-12 MED ORDER — TRAMADOL HCL 50 MG PO TABS: 50.0000 mg | ORAL_TABLET | Freq: Four times a day (QID) | ORAL | 0 refills | Status: DC | PRN

## 2020-11-12 NOTE — ED Notes (Signed)
Placed on 2L [place] due to oxygen level dropping to 80's

## 2020-11-12 NOTE — ED Triage Notes (Signed)
Shortness of breath and left sided chest pain, diagnosed with PNA on Wednesday. Negative covid test Wednesday. Sp02 92% in triage

## 2020-11-12 NOTE — ED Provider Notes (Signed)
[facility] Provider Note: Georgena Spurling, MD, FACEP  CSN: [id number] MRN: [id number] ARRIVAL: 11/12/20 at [place]: [facility]   HISTORY OF PRESENT ILLNESS  11/12/20 1:06 AM Megan Fischer is a 70 y.o. female who has been short of breath for the last 2 weeks.  Her shortness of breath is worse when she lies supine and sleeps sitting up at an angle.  For the past 3 days she has had left side pain.  Specifically she has pain in her left lower ribs and her left flank at about the mid axillary line.  There is no associated rash.  She is not aware of heavy lifting or otherwise injuring herself.  She rates the pain as an 8 out of 10, worse with movement or palpation or with deep breathing.  The pain worsened yesterday evening making it difficult to sleep.  She denies fever, cough, body aches, sore throat, nausea, vomiting or diarrhea.  She had a negative COVID test 2 days ago.  She has chronic edema of the left lower leg.  Past Medical History:  Diagnosis Date   Atrial fibrillation ([place])    Chronic disease anemia 03/29/2011   Diabetes mellitus    Dyslipidemia    Hypertension    Obesities, morbid ([place])    Osteoarthritis     Past Surgical History:  Procedure Laterality Date   CHOLECYSTECTOMY     REPLACEMENT TOTAL KNEE BILATERAL     SHOULDER SURGERY     RIGHT SHOULDER   TUBAL LIGATION      Family History  Problem Relation Age of Onset   Heart attack Mother    Stroke Mother    Diabetes Mother    Hypertension Brother    Breast cancer Neg Hx     Social History   Tobacco Use   Smoking status: Never   Smokeless tobacco: Never  Substance Use Topics   Alcohol use: No   Drug use: No    Prior to Admission medications   Medication Sig Start Date End Date Taking? Authorizing Provider  traMADol (ULTRAM) 50 MG tablet Take 1 tablet (50 mg total) by mouth every 6 (six) hours as needed (for pain). 11/12/20  Yes Brighton Delio,  Hajime Asfaw, MD  benazepril-hydrochlorthiazide (LOTENSIN HCT) 10-12.5 MG tablet TAKE 1 TABLET BY MOUTH  DAILY 05/19/20   Elayne Snare, MD  Blood Glucose Monitoring Suppl (ONETOUCH VERIO IQ SYSTEM) W/DEVICE KIT Use to check blood sugar 2 times per day dx code 250.02 09/12/13   Elayne Snare, MD  CALCIUM PO Take by mouth 2 (two) times daily.    [provider]  cloNIDine (CATAPRES-TTS-2) 0.2 mg/24hr patch Place 1 patch (0.2 mg total) onto the skin once a week. 03/30/14   Elayne Snare, MD  diltiazem (CARDIZEM CD) 360 MG 24 hr capsule TAKE 1 CAPSULE BY MOUTH  DAILY 03/16/20   Elayne Snare, MD  ELIQUIS 5 MG TABS tablet TAKE 1 TABLET BY MOUTH  TWICE DAILY 05/18/20   Nahser, Wonda Cheng, MD  ergocalciferol (VITAMIN D2) 50000 UNITS capsule Take 50,000 Units by mouth once a week.    [provider]  ferrous sulfate 325 (65 FE) MG EC tablet Take 1 tablet (325 mg total) by mouth 2 (two) times daily. 09/27/14   [name], MD  gabapentin (NEURONTIN) 300 MG capsule Take 2 capsules by mouth 3 (three) times daily. 06/17/20   [provider]  metFORMIN (GLUCOPHAGE-XR) 500 MG  24 hr tablet TAKE 4 TABLETS BY MOUTH  DAILY WITH SUPPER. 12/01/19   Elayne Snare, MD  metoprolol succinate (TOPROL-XL) 100 MG 24 hr tablet TAKE 1 TABLET BY MOUTH  DAILY WITH OR IMMEDIATELY  FOLLOWING A MEAL 12/01/19   Elayne Snare, MD  rosuvastatin (CRESTOR) 10 MG tablet TAKE 1 TABLET BY MOUTH  DAILY 05/19/20   Elayne Snare, MD  Semaglutide, 1 MG/DOSE, (OZEMPIC, 1 MG/DOSE,) 2 MG/1.5ML SOPN Inject 1 mg into the skin once a week. 09/20/20   Elayne Snare, MD    Allergies Codeine, Fentanyl, Oxycodone hcl, Penicillins, and Chlorhexidine gluconate   REVIEW OF SYSTEMS  Negative except as noted here or in the History of Present Illness.   PHYSICAL EXAMINATION  Initial Vital Signs Blood pressure (!) 158/86, pulse 77, temperature 98.3 F (36.8 C), resp. rate (!) 22, height _0  (1.6 m), weight 99.3 kg, SpO2 92 %.  Examination General:  Well-developed, well-nourished female in no acute distress; appearance consistent with age of record HENT: normocephalic; atraumatic Eyes: Normal appearance Neck: supple Heart: Irregular rhythm Lungs: clear to auscultation bilaterally Chest/flank: Left lower chest wall tenderness at about the mid axillary line contiguous with left abdominal tenderness about the mid axillary line; no herpetiform rash seen Abdomen: soft; nondistended; nontender anteriorly; bowel sounds present Extremities: No deformity; full range of motion; chronic appearing edema of the lower extremities, left greater than right Neurologic: Awake, alert and oriented; motor function intact in all extremities and symmetric; no facial droop Skin: Warm and dry Psychiatric: Normal mood and affect   RESULTS  Summary of this visit's results, reviewed and interpreted by myself:   EKG Interpretation  Date/Time:  Friday November 12 2020 00:58:27 EDT Ventricular Rate:  82 PR Interval:    QRS Duration: 84 QT Interval:  400 QTC Calculation: 468 R Axis:   -8 Text Interpretation: Atrial fibrillation Ventricular premature complex Borderline repolarization abnormality Previously sinus rhythm with frequent ectopy Confirmed by Dinna Severs [phone]) on 11/12/2020 1:06:05 AM        Laboratory Studies: Results for orders placed or performed during the hospital encounter of 11/12/20 (from the past 24 hour(s))  CBC with Differential     Status: Abnormal   Collection Time: 11/12/20  1:00 AM  Result Value Ref Range   WBC 8.7 4.0 - 10.5 K/uL   RBC 3.76 (L) 3.87 - 5.11 MIL/uL   Hemoglobin 11.0 (L) 12.0 - 15.0 g/dL   HCT 34.1 (L) 36.0 - 46.0 %   MCV 90.7 80.0 - 100.0 fL   MCH 29.3 26.0 - 34.0 pg   MCHC 32.3 30.0 - 36.0 g/dL   RDW 15.9 (H) 11.5 - 15.5 %   Platelets 246 150 - 400 K/uL   nRBC 0.0 0.0 - 0.2 %   Neutrophils Relative % 67 %   Neutro Abs 5.8 1.7 - 7.7 K/uL   Lymphocytes Relative 24 %   Lymphs Abs 2.1 0.7 - 4.0 K/uL    Monocytes Relative 6 %   Monocytes Absolute 0.5 0.1 - 1.0 K/uL   Eosinophils Relative 3 %   Eosinophils Absolute 0.3 0.0 - 0.5 K/uL   Basophils Relative 0 %   Basophils Absolute 0.0 0.0 - 0.1 K/uL   Immature Granulocytes 0 %   Abs Immature Granulocytes 0.03 0.00 - 0.07 K/uL  Comprehensive metabolic panel     Status: Abnormal   Collection Time: 11/12/20  1:00 AM  Result Value Ref Range   Sodium 137 135 - 145 mmol/L  Potassium 4.2 3.5 - 5.1 mmol/L   Chloride 103 98 - 111 mmol/L   CO2 25 22 - 32 mmol/L   Glucose, Bld 148 (H) 70 - 99 mg/dL   BUN 33 (H) 8 - 23 mg/dL   Creatinine, Ser 1.26 (H) 0.44 - 1.00 mg/dL   Calcium 9.9 8.9 - 10.3 mg/dL   Total Protein 8.0 6.5 - 8.1 g/dL   Albumin 3.9 3.5 - 5.0 g/dL   AST 37 15 - 41 U/L   ALT 18 0 - 44 U/L   Alkaline Phosphatase 77 38 - 126 U/L   Total Bilirubin 1.0 0.3 - 1.2 mg/dL   GFR, Estimated 46 (L) >60 mL/min   Anion gap 9 5 - 15  Troponin I (High Sensitivity)     Status: None   Collection Time: 11/12/20  1:00 AM  Result Value Ref Range   Troponin I (High Sensitivity) 17 <18 ng/L  Resp Panel by RT-PCR (Flu A&B, Covid) Nasopharyngeal Swab     Status: None   Collection Time: 11/12/20  1:00 AM   Specimen: Nasopharyngeal Swab; Nasopharyngeal(NP) swabs in vial transport medium  Result Value Ref Range   SARS Coronavirus 2 by RT PCR NEGATIVE NEGATIVE   Influenza A by PCR NEGATIVE NEGATIVE   Influenza B by PCR NEGATIVE NEGATIVE  Brain natriuretic peptide     Status: Abnormal   Collection Time: 11/12/20  1:00 AM  Result Value Ref Range   B Natriuretic Peptide 467.8 (H) 0.0 - 100.0 pg/mL   Imaging Studies: DG Chest 2 View  Result Date: 11/12/2020 CLINICAL DATA:  Shortness of breath, left chest pain, pneumonia EXAM: CHEST - 2 VIEW COMPARISON:  10/21/2006 FINDINGS: Lungs are clear.  No pleural effusion or pneumothorax. The heart is top normal in size.  Thoracic aortic atherosclerosis. Prominent right paratracheal stripe is vascular when  correlating with the prior. Postsurgical changes involving the right shoulder. Thoracic spine is within normal limits. IMPRESSION: Normal chest radiographs. Electronically Signed   By: Julian Hy M.D.   On: 11/12/2020 01:18    ED COURSE and MDM  Nursing notes, initial and subsequent vitals signs, including pulse oximetry, reviewed and interpreted by myself.  Vitals:   11/12/20 0300 11/12/20 0330 11/12/20 0345 11/12/20 0400  BP: (!) 151/79 (!) 148/68  (!) 155/71  Pulse: 66 67 63   Resp: _0 Temp:      SpO2: 97% 96% 95%   Weight:      Height:       Medications  fentaNYL (SUBLIMAZE) injection 50 mcg (50 mcg Intravenous Given 11/12/20 0205)   The patient became hypoxic after receiving fentanyl requiring supplemental oxygen while in the ED.  The chest x-ray shows no sign of pneumonia or pulmonary edema although her BNP is slightly elevated.  She does have a cardiologist, Dr. Acie Fredrickson, who will refer her to for evaluation of possible congestive heart failure.  Her shortness of breath may be due to more to the pain in her left chest wall and abdominal wall.   The patient is unable to take codeine and oxycodone so we will trial tramadol for her pain.    PROCEDURES  Procedures   ED DIAGNOSES     ICD-10-CM   1. Shortness of breath  R06.02     2. Chest wall pain  R07.89     3. Abdominal wall pain  R10.9          Marney Treloar, Jenny Reichmann, MD 11/12/20 [phone]

## 2020-11-12 NOTE — ED Notes (Signed)
This tech took IV out of pt lt arm. And placed 2X2 and secured with tape and held direct pressure. This tech was taking electrods off pt and look at pt lt arm and arm was bleeding at iv site. More gauze was placed over site and secured with 3 strips of tape. Iv site did not bleed any futher. Pt had blood on her underware and this tech tried to get pt to take underware off and place in a plastic bag so as not to get blood on dress but pt refused. Pt was assisted in getting dressed.

## 2020-11-21 ENCOUNTER — Other Ambulatory Visit: Payer: Self-pay | Admitting: Cardiovascular Disease

## 2020-11-21 ENCOUNTER — Other Ambulatory Visit: Payer: Self-pay | Admitting: Endocrinology

## 2020-11-22 NOTE — Telephone Encounter (Signed)
Pt's age 70, wt 99.3 kg, SCr 1.26, CrCl 66.06, last ov w/ PN 07/26/20

## 2020-12-07 ENCOUNTER — Other Ambulatory Visit: Payer: Medicare Other

## 2020-12-09 ENCOUNTER — Ambulatory Visit: Payer: Medicare Other | Admitting: Endocrinology

## 2020-12-24 ENCOUNTER — Other Ambulatory Visit: Payer: Self-pay

## 2020-12-24 ENCOUNTER — Other Ambulatory Visit (INDEPENDENT_AMBULATORY_CARE_PROVIDER_SITE_OTHER): Payer: Medicare Other

## 2020-12-24 DIAGNOSIS — E1165 Type 2 diabetes mellitus with hyperglycemia: Secondary | ICD-10-CM

## 2020-12-24 DIAGNOSIS — E7800 Pure hypercholesterolemia, unspecified: Secondary | ICD-10-CM

## 2020-12-24 LAB — LIPID PANEL
Cholesterol: 156 mg/dL (ref 0–200)
HDL: 79.1 mg/dL (ref >39.00)
LDL Cholesterol: 65 mg/dL (ref 0–99)
NonHDL: 76.94
Total CHOL/HDL Ratio: 2
Triglycerides: 61 mg/dL (ref 0.0–149.0)
VLDL: 12.2 mg/dL (ref 0.0–40.0)

## 2020-12-24 LAB — MICROALBUMIN / CREATININE URINE RATIO
Creatinine,U: 76 mg/dL
Microalb Creat Ratio: 6.2 mg/g (ref 0.0–30.0)
Microalb, Ur: 4.7 mg/dL — ABNORMAL HIGH (ref 0.0–1.9)

## 2020-12-24 LAB — COMPREHENSIVE METABOLIC PANEL
ALT: 14 U/L (ref 0–35)
AST: 22 U/L (ref 0–37)
Albumin: 4.1 g/dL (ref 3.5–5.2)
Alkaline Phosphatase: 73 U/L (ref 39–117)
BUN: 37 mg/dL — ABNORMAL HIGH (ref 6–23)
CO2: 24 mEq/L (ref 19–32)
Calcium: 10.2 mg/dL (ref 8.4–10.5)
Chloride: 105 mEq/L (ref 96–112)
Creatinine, Ser: 1.27 mg/dL — ABNORMAL HIGH (ref 0.40–1.20)
GFR: 43.06 mL/min — ABNORMAL LOW (ref >60.00)
Glucose, Bld: 91 mg/dL (ref 70–99)
Potassium: 3.9 mEq/L (ref 3.5–5.1)
Sodium: 140 mEq/L (ref 135–145)
Total Bilirubin: 0.7 mg/dL (ref 0.2–1.2)
Total Protein: 7.5 g/dL (ref 6.0–8.3)

## 2020-12-24 LAB — HEMOGLOBIN A1C: Hgb A1c MFr Bld: 6.6 % — ABNORMAL HIGH (ref 4.6–6.5)

## 2020-12-29 ENCOUNTER — Ambulatory Visit (INDEPENDENT_AMBULATORY_CARE_PROVIDER_SITE_OTHER): Payer: Medicare Other | Admitting: Endocrinology

## 2020-12-29 ENCOUNTER — Encounter: Payer: Self-pay | Admitting: Endocrinology

## 2020-12-29 ENCOUNTER — Other Ambulatory Visit: Payer: Self-pay

## 2020-12-29 VITALS — BP 122/78 | HR 75 | Ht 63.0 in | Wt 243.2 lb

## 2020-12-29 DIAGNOSIS — I10 Essential (primary) hypertension: Secondary | ICD-10-CM

## 2020-12-29 DIAGNOSIS — E1165 Type 2 diabetes mellitus with hyperglycemia: Secondary | ICD-10-CM | POA: Diagnosis not present

## 2020-12-29 DIAGNOSIS — E7800 Pure hypercholesterolemia, unspecified: Secondary | ICD-10-CM | POA: Diagnosis not present

## 2020-12-29 DIAGNOSIS — R7989 Other specified abnormal findings of blood chemistry: Secondary | ICD-10-CM

## 2020-12-29 NOTE — Progress Notes (Signed)
Patient ID: Megan Fischer, female   DOB: 08/26/1950, 70 y.o.   MRN: [id number]    Reason for Appointment: Follow-up  History of Present Illness   Diagnosis: Type 2 DIABETES MELITUS, date of diagnosis:  2000     Previous history: She has been on various regimens of treatment in the past including Byetta and generally has done better with GLP-1 drugs She continues to take low-dose glimepiride which helps with overall control. Usually blood sugars are not variable Because of occasional hypoglycemia her Amaryl was stopped in 03/2014 Was on Victoza since 2013 until 2019  Recent history: Non-insulin hypoglycemic drugs: Metformin ER 500 mg, 4 tablets at dinner, Ozempic 1.0 mg weekly, on Tuesdays   Current blood sugar patterns and problems identified:  Her A1c is back down to 6.6, previously was higher than usual at 7.5 Her weight appears to be fluctuating but appears to be going up again and she is not clear why She was told to call us for prescription for 2 mg Ozempic but she forgot and is still taking 1 mg Only once she had difficulty getting her prescription refilled but otherwise taking Ozempic regularly Blood sugars at home are fairly good throughout the day and relatively better fasting also compared to her last visit Trying to do some readings at all different times Unable to exercise because of leg pain Highest blood sugar 251 and she gets higher readings when she is eating more fruits such as watermelon  Dinner 5-6 pm     Side effects from medications: None  Monitors blood glucose: once or twice a day.    Glucometer:  One Touch   Blood sugar data with averages from monitor download and review of printout:   PRE-MEAL Morning Lunch Dinner Bedtime Overall  Glucose range: 91-171  104-251  85-251  Mean/median: 110 116 139  123   POST-MEAL PC Breakfast PC Lunch PC Dinner  Glucose range:     Mean/median:   120   Previously  PRE-MEAL Fasting Lunch Dinner Bedtime  Overall  Glucose range:  111-184  93-139     Mean/median:  142    127   POST-MEAL PC Breakfast PC Lunch PC Dinner  Glucose range:    116, 150  Mean/median:          Wt Readings from Last 3 Encounters:  12/29/20 243 lb 3.2 oz (110.3 kg)  11/12/20 219 lb (99.3 kg)  09/06/20 230 lb 3.2 oz (104.4 kg)   Lab Results  Component Value Date   HGBA1C 6.6 (H) 12/24/2020   HGBA1C 7.5 (H) 09/02/2020   HGBA1C 6.9 (H) 03/23/2020   Lab Results  Component Value Date   MICROALBUR 4.7 (H) 12/24/2020   LDLCALC 65 12/24/2020   CREATININE 1.27 (H) 12/24/2020   Other problems discussed today: See review of systems    Lab on 12/24/2020  Component Date Value Ref Range Status   Cholesterol 12/24/2020 156  0 - 200 mg/dL Final   ATP III Classification       Desirable:  < 200 mg/dL               Borderline High:  200 - 239 mg/dL          High:  > = 240 mg/dL   Triglycerides 12/24/2020 61.0  0.0 - 149.0 mg/dL Final   Normal:  <150 mg/dLBorderline High:  150 - 199 mg/dL   HDL 12/24/2020 79.10  >39.00 mg/dL Final   VLDL 12/24/2020 12.2  0.0 - 40.0 mg/dL Final   LDL Cholesterol 12/24/2020 65  0 - 99 mg/dL Final   Total CHOL/HDL Ratio 12/24/2020 2   Final                  Men          Women1/2 Average Risk     3.4          3.3Average Risk          5.0          4.42X Average Risk          9.6          7.13X Average Risk          15.0          11.0                       NonHDL 12/24/2020 76.94   Final   NOTE:  Non-HDL goal should be 30 mg/dL higher than patient's LDL goal (i.e. LDL goal of < 70 mg/dL, would have non-HDL goal of < 100 mg/dL)   Microalb, Ur 12/24/2020 4.7 (A) 0.0 - 1.9 mg/dL Final   Creatinine,U 12/24/2020 76.0  mg/dL Final   Microalb Creat Ratio 12/24/2020 6.2  0.0 - 30.0 mg/g Final   Sodium 12/24/2020 140  135 - 145 mEq/L Final   Potassium 12/24/2020 3.9  3.5 - 5.1 mEq/L Final   Chloride 12/24/2020 105  96 - 112 mEq/L Final   CO2 12/24/2020 24  19 - 32 mEq/L Final   Glucose, Bld  12/24/2020 91  70 - 99 mg/dL Final   BUN 12/24/2020 37 (A) 6 - 23 mg/dL Final   Creatinine, Ser 12/24/2020 1.27 (A) 0.40 - 1.20 mg/dL Final   Total Bilirubin 12/24/2020 0.7  0.2 - 1.2 mg/dL Final   Alkaline Phosphatase 12/24/2020 73  39 - 117 U/L Final   AST 12/24/2020 22  0 - 37 U/L Final   ALT 12/24/2020 14  0 - 35 U/L Final   Total Protein 12/24/2020 7.5  6.0 - 8.3 g/dL Final   Albumin 12/24/2020 4.1  3.5 - 5.2 g/dL Final   GFR 12/24/2020 43.06 (A) >60.00 mL/min Final   Calculated using the CKD-EPI Creatinine Equation (2021)   Calcium 12/24/2020 10.2  8.4 - 10.5 mg/dL Final   Hgb A1c MFr Bld 12/24/2020 6.6 (A) 4.6 - 6.5 % Final   Glycemic Control Guidelines for People with Diabetes:Non Diabetic:  <6%Goal of Therapy: <7%Additional Action Suggested:  >8%     Allergies as of 12/29/2020       Reactions   Codeine    Fentanyl Other (See Comments)   hypoxia   Oxycodone Hcl    Penicillins    Chlorhexidine Gluconate Rash        Medication List        Accurate as of December 29, 2020 10:21 AM. If you have any questions, ask your nurse or doctor.          benazepril-hydrochlorthiazide 10-12.5 MG tablet Commonly known as: LOTENSIN HCT TAKE 1 TABLET BY MOUTH  DAILY   CALCIUM PO Take by mouth 2 (two) times daily.   cloNIDine 0.2 mg/24hr patch Commonly known as: Catapres-TTS-2 Place 1 patch (0.2 mg total) onto the skin once a week.   diltiazem 360 MG 24 hr capsule Commonly known as: CARDIZEM CD TAKE 1 CAPSULE BY MOUTH  DAILY   Eliquis 5 MG Tabs tablet  Generic drug: apixaban TAKE 1 TABLET BY MOUTH  TWICE DAILY   ergocalciferol 1.25 MG (50000 UT) capsule Commonly known as: VITAMIN D2 Take 50,000 Units by mouth once a week.   ferrous sulfate 325 (65 FE) MG EC tablet Take 1 tablet (325 mg total) by mouth 2 (two) times daily.   gabapentin 300 MG capsule Commonly known as: NEURONTIN Take 2 capsules by mouth 3 (three) times daily.   metFORMIN 500 MG 24 hr  tablet Commonly known as: GLUCOPHAGE-XR TAKE 4 TABLETS BY MOUTH  DAILY WITH SUPPER.   metoprolol succinate 100 MG 24 hr tablet Commonly known as: TOPROL-XL TAKE 1 TABLET BY MOUTH  DAILY WITH OR IMMEDIATELY  FOLLOWING A MEAL   OneTouch Verio IQ System w/Device Kit Use to check blood sugar 2 times per day dx code 250.02   Ozempic (1 MG/DOSE) 2 MG/1.5ML Sopn Generic drug: Semaglutide (1 MG/DOSE) Inject 1 mg into the skin once a week.   rosuvastatin 10 MG tablet Commonly known as: CRESTOR TAKE 1 TABLET BY MOUTH  DAILY   traMADol 50 MG tablet Commonly known as: ULTRAM Take 1 tablet (50 mg total) by mouth every 6 (six) hours as needed (for pain).        Allergies:  Allergies  Allergen Reactions   Codeine    Fentanyl Other (See Comments)    hypoxia   Oxycodone Hcl    Penicillins    Chlorhexidine Gluconate Rash    Past Medical History:  Diagnosis Date   Atrial fibrillation ([facility])    Chronic disease anemia 03/29/2011   Diabetes mellitus    Dyslipidemia    Hypertension    Obesities, morbid ([facility])    Osteoarthritis     Past Surgical History:  Procedure Laterality Date   CHOLECYSTECTOMY     REPLACEMENT TOTAL KNEE BILATERAL     SHOULDER SURGERY     RIGHT SHOULDER   TUBAL LIGATION      Family History  Problem Relation Age of Onset   Heart attack Mother    Stroke Mother    Diabetes Mother    Hypertension Brother    Breast cancer Neg Hx     Social History:  reports that she has never smoked. She has never used smokeless tobacco. She reports that she does not drink alcohol and does not use drugs.  Review of Systems:  History of atrial fibrillation: She is on Eliquis  and followed by cardiologist  Hypertension:  she has required multiple medications for control  She is taking diltiazem, metoprolol, Catapres 0.2 patch, Lotensin HCT 10/12.5 Blood pressure overall fairly well controlled  Creatinine is generally slightly above normal  Microalbumin is normal as  of 09/2019  BP Readings from Last 3 Encounters:  12/29/20 122/78  11/12/20 134/85  09/06/20 132/82   Lab Results  Component Value Date   CREATININE 1.27 (H) 12/24/2020   CREATININE 1.26 (H) 11/12/2020   CREATININE 1.26 (H) 09/02/2020     Lipids: Consistently well controlled with Crestor 10 mg and LDL is as follows:   Lab Results  Component Value Date   CHOL 156 12/24/2020   HDL 79.10 12/24/2020   LDLCALC 65 12/24/2020   TRIG 61.0 12/24/2020   CHOLHDL 2 12/24/2020    History of autonomous multinodular goiter TSH is usually low normal or slightly low  She has normal T3 and free T4 levels consistently   Lab Results  Component Value Date   T3FREE 2.8 09/02/2020   T3FREE 3.5 12/23/2019  T3FREE 2.6 05/19/2019   T3FREE 2.8 09/12/2018      Lab Results  Component Value Date   FREET4 1.07 09/02/2020   FREET4 1.19 12/23/2019   FREET4 1.31 05/19/2019   TSH 0.19 (L) 09/02/2020   TSH 0.39 12/23/2019   TSH 0.29 (L) 05/19/2019   She is going to see a new PCP and he has ordered testing for her carpal tunnel syndrome    Examination:   BP 122/78   Pulse 75   Ht _0  (1.6 m)   Wt 243 lb 3.2 oz (110.3 kg)   SpO2 99%   BMI 43.08 kg/m   Body mass index is 43.08 kg/m.    ASSESSMENT/ PLAN:   Diabetes type 2, with morbid obesity Current regimen is 2000 mg metformin and Ozempic 1 mg weekly  See history of present illness for detailed discussion of current diabetes management, blood sugar patterns and problems identified  Her A1c is down to 6.6  Her blood sugars are generally better controlled and A1c is improved but she is still taking 1 mg Ozempic  Although her weight has gone up not clear why  Currently since 2 mg Ozempic is not available we will have her continue 1 mg   She will try to cut back on overall calories, portions and fruits like watermelon  Encourage her to be as active as possible   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 Any transcriptional errors that result from this process are unintentional.

## 2021-01-05 ENCOUNTER — Ambulatory Visit: Payer: Medicare Other | Admitting: Physician Assistant

## 2021-01-19 ENCOUNTER — Other Ambulatory Visit: Payer: Self-pay | Admitting: Neurosurgery

## 2021-01-19 DIAGNOSIS — M5440 Lumbago with sciatica, unspecified side: Secondary | ICD-10-CM

## 2021-02-03 ENCOUNTER — Other Ambulatory Visit: Payer: Self-pay | Admitting: Endocrinology

## 2021-02-09 ENCOUNTER — Ambulatory Visit
Admission: RE | Admit: 2021-02-09 | Discharge: 2021-02-09 | Disposition: A | Discharge status: Home / Self Care | Payer: Medicare Other | Source: Ambulatory Visit | Attending: Neurosurgery | Admitting: Neurosurgery

## 2021-02-09 ENCOUNTER — Other Ambulatory Visit: Payer: Self-pay

## 2021-02-09 DIAGNOSIS — M5440 Lumbago with sciatica, unspecified side: Secondary | ICD-10-CM

## 2021-02-15 ENCOUNTER — Telehealth: Payer: Self-pay | Admitting: Nurse Practitioner

## 2021-02-15 NOTE — Telephone Encounter (Signed)
Scheduled appts per 10/11 referral and msg from [facility]. Called pt, no answer. Left msg with appt date and time.

## 2021-02-18 ENCOUNTER — Telehealth: Payer: Self-pay | Admitting: Nurse Practitioner

## 2021-02-18 NOTE — Telephone Encounter (Signed)
Rescheduled per 10/14 in basket, pt has been called and is aware of appt change

## 2021-02-24 ENCOUNTER — Other Ambulatory Visit: Payer: Medicare Other

## 2021-02-24 ENCOUNTER — Ambulatory Visit: Payer: Medicare Other | Admitting: Nurse Practitioner

## 2021-02-24 ENCOUNTER — Ambulatory Visit: Payer: Medicare Other

## 2021-02-28 ENCOUNTER — Other Ambulatory Visit: Payer: Self-pay | Admitting: Nurse Practitioner

## 2021-02-28 DIAGNOSIS — D638 Anemia in other chronic diseases classified elsewhere: Secondary | ICD-10-CM

## 2021-02-28 NOTE — Progress Notes (Deleted)
[place]   [phone] [phone]   Clinic Follow up Note   Patient Care Team: [name], MD as PCP - General (Internal Medicine) Nahser, Wonda Cheng, MD as PCP - Cardiology (Cardiology) 02/28/2021  CHIEF COMPLAINT: Follow up anemia of chronic disease  CURRENT THERAPY: Aranesp and IV iron PRN, and oral iron once daily  INTERVAL HISTORY: Megan Fischer returns for follow up as scheduled. Last seen by me 08/12/20.    REVIEW OF SYSTEMS:   Constitutional: Denies fevers, chills or abnormal weight loss Eyes: Denies blurriness of vision Ears, nose, mouth, throat, and face: Denies mucositis or sore throat Respiratory: Denies cough, dyspnea or wheezes Cardiovascular: Denies palpitation, chest discomfort or lower extremity swelling Gastrointestinal:  Denies nausea, heartburn or change in bowel habits Skin: Denies abnormal skin rashes Lymphatics: Denies new lymphadenopathy or easy bruising Neurological:Denies numbness, tingling or new weaknesses Behavioral/Psych: Mood is stable, no new changes  All other systems were reviewed with the patient and are negative.  MEDICAL HISTORY:  Past Medical History:  Diagnosis Date   Atrial fibrillation ([place])    Chronic disease anemia 03/29/2011   Diabetes mellitus    Dyslipidemia    Hypertension    Obesities, morbid ([place])    Osteoarthritis     SURGICAL HISTORY: Past Surgical History:  Procedure Laterality Date   CHOLECYSTECTOMY     REPLACEMENT TOTAL KNEE BILATERAL     SHOULDER SURGERY     RIGHT SHOULDER   TUBAL LIGATION      I have reviewed the social history and family history with the patient and they are unchanged from previous note.  ALLERGIES:  is allergic to codeine, fentanyl, oxycodone hcl, penicillins, and chlorhexidine gluconate.  MEDICATIONS:  Current Outpatient Medications  Medication Sig Dispense Refill   apixaban (ELIQUIS) 5 MG TABS tablet TAKE 1 TABLET BY MOUTH  TWICE DAILY 180 tablet  1   benazepril-hydrochlorthiazide (LOTENSIN HCT) 10-12.5 MG tablet TAKE 1 TABLET BY MOUTH  DAILY 90 tablet 3   Blood Glucose Monitoring Suppl (ONETOUCH VERIO IQ SYSTEM) W/DEVICE KIT Use to check blood sugar 2 times per day dx code 250.02 1 kit 0   CALCIUM PO Take by mouth 2 (two) times daily.     cloNIDine (CATAPRES-TTS-2) 0.2 mg/24hr patch Place 1 patch (0.2 mg total) onto the skin once a week. 4 patch 2   diltiazem (CARDIZEM CD) 360 MG 24 hr capsule TAKE 1 CAPSULE BY MOUTH  DAILY 90 capsule 3   ergocalciferol (VITAMIN D2) 50000 UNITS capsule Take 50,000 Units by mouth once a week.     ferrous sulfate 325 (65 FE) MG EC tablet Take 1 tablet (325 mg total) by mouth 2 (two) times daily. 60 tablet 3   gabapentin (NEURONTIN) 300 MG capsule Take 2 capsules by mouth 3 (three) times daily.     metFORMIN (GLUCOPHAGE-XR) 500 MG 24 hr tablet TAKE 4 TABLETS BY MOUTH  DAILY WITH SUPPER. 360 tablet 3   metoprolol succinate (TOPROL-XL) 100 MG 24 hr tablet TAKE 1 TABLET BY MOUTH  DAILY WITH OR IMMEDIATELY  FOLLOWING A MEAL 90 tablet 3   ONETOUCH VERIO test strip USE AS DIRECTED TO CHECK  BLOOD SUGAR 2 TIMES DAILY 200 strip 3   rosuvastatin (CRESTOR) 10 MG tablet TAKE 1 TABLET BY MOUTH  DAILY 90 tablet 3   Semaglutide, 1 MG/DOSE, (OZEMPIC, 1 MG/DOSE,) 2 MG/1.5ML SOPN Inject 1 mg into the skin once a week. 9 mL 3   traMADol (ULTRAM) 50 MG  tablet Take 1 tablet (50 mg total) by mouth every 6 (six) hours as needed (for pain). 20 tablet 0   No current facility-administered medications for this visit.    PHYSICAL EXAMINATION: ECOG PERFORMANCE STATUS: {CHL ONC ECOG PS:[id number]}  There were no vitals filed for this visit. There were no vitals filed for this visit.  GENERAL:alert, no distress and comfortable SKIN: skin color, texture, turgor are normal, no rashes or significant lesions EYES: normal, Conjunctiva are pink and non-injected, sclera clear OROPHARYNX:no exudate, no erythema and lips, buccal mucosa,  and tongue normal  NECK: supple, thyroid normal size, non-tender, without nodularity LYMPH:  no palpable lymphadenopathy in the cervical, axillary or inguinal LUNGS: clear to auscultation and percussion with normal breathing effort HEART: regular rate & rhythm and no murmurs and no lower extremity edema ABDOMEN:abdomen soft, non-tender and normal bowel sounds Musculoskeletal:no cyanosis of digits and no clubbing  NEURO: alert & oriented x 3 with fluent speech, no focal motor/sensory deficits  LABORATORY DATA:  I have reviewed the data as listed CBC Latest Ref Rng & Units 11/12/2020 08/12/2020 08/12/2020  WBC 4.0 - 10.5 K/uL 8.7 5.3 -  Hemoglobin 12.0 - 15.0 g/dL 11.0(L) 12.8 -  Hematocrit 36.0 - 46.0 % 34.1(L) 38.7 39.3  Platelets 150 - 400 K/uL 246 242 -     CMP Latest Ref Rng & Units 12/24/2020 11/12/2020 09/02/2020  Glucose 70 - 99 mg/dL 91 148(H) 117(H)  BUN 6 - 23 mg/dL 37(H) 33(H) 30(H)  Creatinine 0.40 - 1.20 mg/dL 1.27(H) 1.26(H) 1.26(H)  Sodium 135 - 145 mEq/L 140 137 142  Potassium 3.5 - 5.1 mEq/L 3.9 4.2 3.9  Chloride 96 - 112 mEq/L 105 103 103  CO2 19 - 32 mEq/L 24 25 30   Calcium 8.4 - 10.5 mg/dL 10.2 9.9 9.9  Total Protein 6.0 - 8.3 g/dL 7.5 8.0 -  Total Bilirubin 0.2 - 1.2 mg/dL 0.7 1.0 -  Alkaline Phos 39 - 117 U/L 73 77 -  AST 0 - 37 U/L 22 37 -  ALT 0 - 35 U/L 14 18 -      RADIOGRAPHIC STUDIES: I have personally reviewed the radiological images as listed and agreed with the findings in the report. No results found.   ASSESSMENT & PLAN:  No problem-specific Assessment & Plan notes found for this encounter.   No orders of the defined types were placed in this encounter.  All questions were answered. The patient knows to call the clinic with any problems, questions or concerns. No barriers to learning was detected. I spent {CHL ONC TIME VISIT - YSAYT:[id number]} counseling the patient face to face. The total time spent in the appointment was {CHL ONC TIME VISIT -  FTDDU:[id number]} and more than 50% was on counseling and review of test results     Alla Feeling, NP 02/28/21

## 2021-03-01 ENCOUNTER — Inpatient Hospital Stay: Payer: Medicare Other | Admitting: Nurse Practitioner

## 2021-03-01 ENCOUNTER — Inpatient Hospital Stay: Payer: Medicare Other

## 2021-03-01 NOTE — Progress Notes (Signed)
[place]   [phone]) [phone] [phone]) [phone]   Clinic Follow up Note   Patient Care Team: [name], MD as PCP - General (Internal Medicine) Nahser, Wonda Cheng, MD as PCP - Cardiology (Cardiology) 03/02/2021  CHIEF COMPLAINT: Follow-up anemia of chronic disease  CURRENT THERAPY: Aranesp as needed  INTERVAL HISTORY: Megan Fischer returns for follow-up as scheduled.  Last seen by me 08/12/2020.  She denies changes in her health except recent pneumonia last month, she has recovered completely with no residual fever, chills, cough, chest pain, dyspnea.  Energy and appetite are at baseline, weight fluctuates.  Bowels moving normally on oral iron once daily, denies black or bloody stools.  She reportedly had a colonoscopy on Monday at [facility], she thinks it was normal.  She had increased left leg pain and swelling, saw her oncologist at [facility] who obtained inflammatory labs and x-ray, unremarkable per patient.  Left leg swelling has improved.  Pain fluctuates, she has fallen more lately without energy, states her left leg "just gives out."  She has a walker but does not use it much.  Her daughter recommends that she get an aide.  Megan Fischer is independent with ADLs, would just need someone to do light housework.  She is overdue for screening mammogram and also due for flu vaccine.    MEDICAL HISTORY:  Past Medical History:  Diagnosis Date   Atrial fibrillation ([place])    Chronic disease anemia 03/29/2011   Diabetes mellitus    Dyslipidemia    Hypertension    Obesities, morbid ([place])    Osteoarthritis     SURGICAL HISTORY: Past Surgical History:  Procedure Laterality Date   CHOLECYSTECTOMY     REPLACEMENT TOTAL KNEE BILATERAL     SHOULDER SURGERY     RIGHT SHOULDER   TUBAL LIGATION      I have reviewed the social history and family history with the patient and they are unchanged from previous note.  ALLERGIES:  is allergic to codeine, fentanyl,  oxycodone hcl, penicillins, and chlorhexidine gluconate.  MEDICATIONS:  Current Outpatient Medications  Medication Sig Dispense Refill   apixaban (ELIQUIS) 5 MG TABS tablet TAKE 1 TABLET BY MOUTH  TWICE DAILY 180 tablet 1   benazepril-hydrochlorthiazide (LOTENSIN HCT) 10-12.5 MG tablet TAKE 1 TABLET BY MOUTH  DAILY 90 tablet 3   Blood Glucose Monitoring Suppl (ONETOUCH VERIO IQ SYSTEM) W/DEVICE KIT Use to check blood sugar 2 times per day dx code 250.02 1 kit 0   CALCIUM PO Take by mouth 2 (two) times daily.     cloNIDine (CATAPRES-TTS-2) 0.2 mg/24hr patch Place 1 patch (0.2 mg total) onto the skin once a week. 4 patch 2   diltiazem (CARDIZEM CD) 360 MG 24 hr capsule TAKE 1 CAPSULE BY MOUTH  DAILY 90 capsule 3   ergocalciferol (VITAMIN D2) 50000 UNITS capsule Take 50,000 Units by mouth once a week.     ferrous sulfate 325 (65 FE) MG EC tablet Take 1 tablet (325 mg total) by mouth 2 (two) times daily. 60 tablet 3   gabapentin (NEURONTIN) 300 MG capsule Take 2 capsules by mouth 3 (three) times daily.     metFORMIN (GLUCOPHAGE-XR) 500 MG 24 hr tablet TAKE 4 TABLETS BY MOUTH  DAILY WITH SUPPER. 360 tablet 3   metoprolol succinate (TOPROL-XL) 100 MG 24 hr tablet TAKE 1 TABLET BY MOUTH  DAILY WITH OR IMMEDIATELY  FOLLOWING A MEAL 90 tablet 3   ONETOUCH VERIO test strip USE  AS DIRECTED TO CHECK  BLOOD SUGAR 2 TIMES DAILY 200 strip 3   rosuvastatin (CRESTOR) 10 MG tablet TAKE 1 TABLET BY MOUTH  DAILY 90 tablet 3   Semaglutide, 1 MG/DOSE, (OZEMPIC, 1 MG/DOSE,) 2 MG/1.5ML SOPN Inject 1 mg into the skin once a week. 9 mL 3   No current facility-administered medications for this visit.    PHYSICAL EXAMINATION: ECOG PERFORMANCE STATUS: 1 - Symptomatic but completely ambulatory  Vitals:   03/02/21 0833  BP: (!) 179/97  Pulse: 83  Resp: 18  Temp: 98.1 F (36.7 C)  SpO2: 99%   Filed Weights   03/02/21 0833  Weight: 231 lb 11.2 oz (105.1 kg)    GENERAL:alert, no distress and comfortable SKIN:  No rash EYES:  sclera clear LUNGS: clear with normal breathing effort HEART: regular rate & rhythm, left greater than right lower leg swelling  NEURO: alert & oriented x 3 with fluent speech, ambulating with cane  LABORATORY DATA:  I have reviewed the data as listed CBC Latest Ref Rng & Units 03/02/2021 11/12/2020 08/12/2020  WBC 4.0 - 10.5 K/uL 4.9 8.7 5.3  Hemoglobin 12.0 - 15.0 g/dL 10.9(L) 11.0(L) 12.8  Hematocrit 36.0 - 46.0 % 33.3(L) 34.1(L) 38.7  Platelets 150 - 400 K/uL 214 246 242     CMP Latest Ref Rng & Units 03/02/2021 12/24/2020 11/12/2020  Glucose 70 - 99 mg/dL 111(H) 91 148(H)  BUN 8 - 23 mg/dL 26(H) 37(H) 33(H)  Creatinine 0.44 - 1.00 mg/dL 1.41(H) 1.27(H) 1.26(H)  Sodium 135 - 145 mmol/L 144 140 137  Potassium 3.5 - 5.1 mmol/L 3.5 3.9 4.2  Chloride 98 - 111 mmol/L 109 105 103  CO2 22 - 32 mmol/L _0 Calcium 8.9 - 10.3 mg/dL 9.8 10.2 9.9  Total Protein 6.5 - 8.1 g/dL 7.8 7.5 8.0  Total Bilirubin 0.3 - 1.2 mg/dL 0.5 0.7 1.0  Alkaline Phos 38 - 126 U/L 91 73 77  AST 15 - 41 U/L 24 22 37  ALT 0 - 44 U/L _1 RADIOGRAPHIC STUDIES: I have personally reviewed the radiological images as listed and agreed with the findings in the report. No results found.   ASSESSMENT & PLAN: 70 year old female   1. Anemia of chronic disease and h/o IDA -Initial work-up showed normal ferritin and serum iron level, slightly low iron saturation, normal K74 and folic acid.  Normal EPO, SPEP negative -Given her multiple comorbidities especially diabetes this was felt to be anemia of chronic disease -Given no other cytopenias, primary bone marrow disorder was felt to be less likely, a bone marrow biopsy has not been done -She responded well to Aranesp and Feraheme in the past, last given 2019.  Hg normalized -she has recurrent mild anemia recently 10.9 - 11 since 11/2020 in the setting of recent PNA and worsening CKD. BP 179/97 today, she has not taken meds -Megan Fischer appears  stable. She is asymptomatic of anemia. Denies bleeding. I will request the report from her recent colonoscopy.  -I suspect her mild anemia is again multifactorial, from recent infection and CKD. Iron levels are in good range. Continue oral iron. No need for IV feraheme at this time -I recommend to continue holding Aranesp for now, will repeat labs every 2 months and resume Aranesp if hemoglobin continues to drop -I encouraged her to work on tighter control of her other comorbidities including HTN, CKD, DM -f/up in 6 months, or sooner if needed  2.  CKD -Intermittent since at least 2019, Scr up to 1.59 in 08/2020 -Denies NSAID use, meds reviewed, she is on multidrug regimen for HTN, A. fib, DM including diuretics and anticoagulation which can cause renal dysfunction.   -Hemoglobin A1c 6-7.5 since 2019 -SCR 1.41 today, slightly increased in the past few months -Continue follow-up with PCP and endocrinology   3. 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 Adine Madura ([place], [place]) -followed at [facility] by Dr. Jackqulyn Livings -Recently seen 12/2020 with increased left leg pain, swelling, and recent falls.  Inflammatory work-up showed mildly elevated sed rate, tib-fib x-ray showed soft tissue swelling at the tibial prosthesis, otherwise no acute changes.  No further work-up or intervention was recommended.   -She was told to follow-up in 1 year -Given her multiple falls lately, I strongly encouraged her to consider using a walker, she will consider -I think an aide is reasonable, patient's family can reach out to private agencies such as [employer].  She will let me know if she needs medical assistance with ADLs/meds rather than just house work.   4.  Health promotion and disease prevention -She follows up regularly with PCP, cardiology, endocrine, and oncology at [facility] for GCT -Overdue for mammogram last done 2019, I recommend to complete it this year and continue  annually. she will call breast center -per patient she had colonoscopy at [facility] 10/24, I will request the report -her activity is limited due to leg pain and co-morbidities, I encouraged her to safely mobilize  PLAN: -Labs reviewed -Continue oral iron, no need for IV Feraheme at this time -Hgb 10.9, hold aranesp -Lab q2 months, will restart aranesp if hgb continues to drop  -Flu vac today -Continue f/up with PCP, endo, cardiology, and [facility]  -Consider switching to walker due to recent falls -Pt/family will research home aides  -F/up in 6 months, or sooner if needed  All questions were answered. The patient knows to call the clinic with any problems, questions or concerns. No barriers to learning were detected.     Alla Feeling, NP 03/02/21

## 2021-03-02 ENCOUNTER — Inpatient Hospital Stay (HOSPITAL_BASED_OUTPATIENT_CLINIC_OR_DEPARTMENT_OTHER): Payer: Medicare Other | Admitting: Nurse Practitioner

## 2021-03-02 ENCOUNTER — Inpatient Hospital Stay: Payer: Medicare Other

## 2021-03-02 ENCOUNTER — Encounter: Payer: Self-pay | Admitting: Nurse Practitioner

## 2021-03-02 ENCOUNTER — Other Ambulatory Visit: Payer: Self-pay

## 2021-03-02 ENCOUNTER — Inpatient Hospital Stay: Payer: Medicare Other | Attending: Nurse Practitioner

## 2021-03-02 VITALS — BP 179/97 | HR 83 | Temp 98.1°F | Resp 18 | Wt 231.7 lb

## 2021-03-02 DIAGNOSIS — N189 Chronic kidney disease, unspecified: Secondary | ICD-10-CM

## 2021-03-02 DIAGNOSIS — I129 Hypertensive chronic kidney disease with stage 1 through stage 4 chronic kidney disease, or unspecified chronic kidney disease: Secondary | ICD-10-CM | POA: Diagnosis not present

## 2021-03-02 DIAGNOSIS — D638 Anemia in other chronic diseases classified elsewhere: Secondary | ICD-10-CM | POA: Diagnosis present

## 2021-03-02 DIAGNOSIS — R700 Elevated erythrocyte sedimentation rate: Secondary | ICD-10-CM | POA: Diagnosis not present

## 2021-03-02 DIAGNOSIS — I4891 Unspecified atrial fibrillation: Secondary | ICD-10-CM | POA: Insufficient documentation

## 2021-03-02 DIAGNOSIS — Z79899 Other long term (current) drug therapy: Secondary | ICD-10-CM | POA: Diagnosis not present

## 2021-03-02 DIAGNOSIS — E1122 Type 2 diabetes mellitus with diabetic chronic kidney disease: Secondary | ICD-10-CM | POA: Insufficient documentation

## 2021-03-02 DIAGNOSIS — Z7901 Long term (current) use of anticoagulants: Secondary | ICD-10-CM | POA: Diagnosis not present

## 2021-03-02 DIAGNOSIS — Z23 Encounter for immunization: Secondary | ICD-10-CM

## 2021-03-02 DIAGNOSIS — D508 Other iron deficiency anemias: Secondary | ICD-10-CM | POA: Insufficient documentation

## 2021-03-02 DIAGNOSIS — Z7984 Long term (current) use of oral hypoglycemic drugs: Secondary | ICD-10-CM | POA: Insufficient documentation

## 2021-03-02 DIAGNOSIS — D631 Anemia in chronic kidney disease: Secondary | ICD-10-CM

## 2021-03-02 DIAGNOSIS — D509 Iron deficiency anemia, unspecified: Secondary | ICD-10-CM

## 2021-03-02 LAB — CBC WITH DIFFERENTIAL (CANCER CENTER ONLY)
Abs Immature Granulocytes: 0.01 10*3/uL (ref 0.00–0.07)
Basophils Absolute: 0.1 10*3/uL (ref 0.0–0.1)
Basophils Relative: 1 %
Eosinophils Absolute: 0.2 10*3/uL (ref 0.0–0.5)
Eosinophils Relative: 3 %
HCT: 33.3 % — ABNORMAL LOW (ref 36.0–46.0)
Hemoglobin: 10.9 g/dL — ABNORMAL LOW (ref 12.0–15.0)
Immature Granulocytes: 0 %
Lymphocytes Relative: 27 %
Lymphs Abs: 1.3 10*3/uL (ref 0.7–4.0)
MCH: 28.4 pg (ref 26.0–34.0)
MCHC: 32.7 g/dL (ref 30.0–36.0)
MCV: 86.7 fL (ref 80.0–100.0)
Monocytes Absolute: 0.4 10*3/uL (ref 0.1–1.0)
Monocytes Relative: 8 %
Neutro Abs: 3 10*3/uL (ref 1.7–7.7)
Neutrophils Relative %: 61 %
Platelet Count: 214 10*3/uL (ref 150–400)
RBC: 3.84 MIL/uL — ABNORMAL LOW (ref 3.87–5.11)
RDW: 16.4 % — ABNORMAL HIGH (ref 11.5–15.5)
WBC Count: 4.9 10*3/uL (ref 4.0–10.5)
nRBC: 0 % (ref 0.0–0.2)

## 2021-03-02 LAB — COMPREHENSIVE METABOLIC PANEL
ALT: 16 U/L (ref 0–44)
AST: 24 U/L (ref 15–41)
Albumin: 3.7 g/dL (ref 3.5–5.0)
Alkaline Phosphatase: 91 U/L (ref 38–126)
Anion gap: 13 (ref 5–15)
BUN: 26 mg/dL — ABNORMAL HIGH (ref 8–23)
CO2: 22 mmol/L (ref 22–32)
Calcium: 9.8 mg/dL (ref 8.9–10.3)
Chloride: 109 mmol/L (ref 98–111)
Creatinine, Ser: 1.41 mg/dL — ABNORMAL HIGH (ref 0.44–1.00)
GFR, Estimated: 40 mL/min — ABNORMAL LOW (ref >60)
Glucose, Bld: 111 mg/dL — ABNORMAL HIGH (ref 70–99)
Potassium: 3.5 mmol/L (ref 3.5–5.1)
Sodium: 144 mmol/L (ref 135–145)
Total Bilirubin: 0.5 mg/dL (ref 0.3–1.2)
Total Protein: 7.8 g/dL (ref 6.5–8.1)

## 2021-03-02 LAB — FERRITIN: Ferritin: 114 ng/mL (ref 11–307)

## 2021-03-02 LAB — IRON AND TIBC
Iron: 44 ug/dL (ref 41–142)
Saturation Ratios: 14 % — ABNORMAL LOW (ref 21–57)
TIBC: 318 ug/dL (ref 236–444)
UIBC: 274 ug/dL (ref 120–384)

## 2021-03-02 MED ORDER — INFLUENZA VAC A&B SA ADJ QUAD 0.5 ML IM PRSY
0.5000 mL | PREFILLED_SYRINGE | Freq: Once | INTRAMUSCULAR | Status: AC
Start: 2021-03-02 — End: 2021-03-02
  Administered 2021-03-02: 0.5 mL via INTRAMUSCULAR
  Filled 2021-03-02: qty 0.5

## 2021-03-02 NOTE — Patient Instructions (Signed)
Influenza, Adult °Influenza, also called "the flu," is a viral infection that mainly affects the respiratory tract. This includes the lungs, nose, and throat. The flu spreads easily from person to person (is contagious). It causes common cold symptoms, along with high fever and body aches. °What are the causes? °This condition is caused by the influenza virus. You can get the virus by: °Breathing in droplets that are in the air from an infected person's cough or sneeze. °Touching something that has the virus on it (has been contaminated) and then touching your mouth, nose, or eyes. °What increases the risk? °The following factors may make you more likely to get the flu: °Not washing or sanitizing your hands often. °Having close contact with many people during cold and flu season. °Touching your mouth, eyes, or nose without first washing or sanitizing your hands. °Not getting an annual flu shot. °You may have a higher risk for the flu, including serious problems, such as a lung infection (pneumonia), if you: °Are older than 65. °Are pregnant. °Have a weakened disease-fighting system (immune system). This includes people who have HIV or AIDS, are on chemotherapy, or are taking medicines that reduce (suppress) the immune system. °Have a long-term (chronic) illness, such as heart disease, kidney disease, diabetes, or lung disease. °Have a liver disorder. °Are severely overweight (morbidly obese). °Have anemia. °Have asthma. °What are the signs or symptoms? °Symptoms of this condition usually begin suddenly and last 4-14 days. These may include: °Fever and chills. °Headaches, body aches, or muscle aches. °Sore throat. °Cough. °Runny or stuffy (congested) nose. °Chest discomfort. °Poor appetite. °Weakness or fatigue. °Dizziness. °Nausea or vomiting. °How is this diagnosed? °This condition may be diagnosed based on: °Your symptoms and medical history. °A physical exam. °Swabbing your nose or throat and testing the fluid  for the influenza virus. °How is this treated? °If the flu is diagnosed early, you can be treated with antiviral medicine that is given by mouth (orally) or through an IV. This can help reduce how severe the illness is and how long it lasts. °Taking care of yourself at home can help relieve symptoms. Your health care provider may recommend: °Taking over-the-counter medicines. °Drinking plenty of fluids. °In many cases, the flu goes away on its own. If you have severe symptoms or complications, you may be treated in a hospital. °Follow these instructions at home: °Activity °Rest as needed and get plenty of sleep. °Stay home from work or school as told by your health care provider. Unless you are visiting your health care provider, avoid leaving home until your fever has been gone for 24 hours without taking medicine. °Eating and drinking °Take an oral rehydration solution (ORS). This is a drink that is sold at pharmacies and retail stores. °Drink enough fluid to keep your urine pale yellow. °Drink clear fluids in small amounts as you are able. Clear fluids include water, ice chips, fruit juice mixed with water, and low-calorie sports drinks. °Eat bland, easy-to-digest foods in small amounts as you are able. These foods include bananas, applesauce, rice, lean meats, toast, and crackers. °Avoid drinking fluids that contain a lot of sugar or caffeine, such as energy drinks, regular sports drinks, and soda. °Avoid alcohol. °Avoid spicy or fatty foods. °General instructions °  °Take over-the-counter and prescription medicines only as told by your health care provider. °Use a cool mist humidifier to add humidity to the air in your home. This can make it easier to breathe. °When using a cool mist humidifier,   clean it daily. Empty the water and replace it with clean water. °Cover your mouth and nose when you cough or sneeze. °Wash your hands with soap and water often and for at least 20 seconds, especially after you cough or  sneeze. If soap and water are not available, use alcohol-based hand sanitizer. °Keep all follow-up visits. This is important. °How is this prevented? ° °Get an annual flu shot. This is usually available in late summer, fall, or winter. Ask your health care provider when you should get your flu shot. °Avoid contact with people who are sick during cold and flu season. This is generally fall and winter. °Contact a health care provider if: °You develop new symptoms. °You have: °Chest pain. °Diarrhea. °A fever. °Your cough gets worse. °You produce more mucus. °You feel nauseous or you vomit. °Get help right away if you: °Develop shortness of breath or have difficulty breathing. °Have skin or nails that turn a bluish color. °Have severe pain or stiffness in your neck. °Develop a sudden headache or sudden pain in your face or ear. °Cannot eat or drink without vomiting. °These symptoms may represent a serious problem that is an emergency. Do not wait to see if the symptoms will go away. Get medical help right away. Call your local emergency services (911 in the [place] Do not drive yourself to the hospital. °Summary °Influenza, also called "the flu," is a viral infection that primarily affects your respiratory tract. °Symptoms of the flu usually begin suddenly and last 4-14 days. °Getting an annual flu shot is the best way to prevent getting the flu. °Stay home from work or school as told by your health care provider. Unless you are visiting your health care provider, avoid leaving home until your fever has been gone for 24 hours without taking medicine. °Keep all follow-up visits. This is important. °This information is not intended to replace advice given to you by your health care provider. Make sure you discuss any questions you have with your health care provider. °Document Revised: 12/12/2019 Document Reviewed: 12/12/2019 °Elsevier Patient Education © 2022 [employer] ° °

## 2021-03-03 ENCOUNTER — Other Ambulatory Visit: Payer: Self-pay | Admitting: Adult Medicine

## 2021-03-03 ENCOUNTER — Telehealth: Payer: Self-pay | Admitting: Nurse Practitioner

## 2021-03-03 ENCOUNTER — Other Ambulatory Visit: Payer: Self-pay | Admitting: Psychiatry

## 2021-03-03 DIAGNOSIS — Z1231 Encounter for screening mammogram for malignant neoplasm of breast: Secondary | ICD-10-CM

## 2021-03-03 LAB — FOLATE RBC
Folate, Hemolysate: 478 ng/mL
Folate, RBC: 1339 ng/mL (ref >498)
Hematocrit: 35.7 % (ref 34.0–46.6)

## 2021-03-03 NOTE — Telephone Encounter (Signed)
Scheduled follow-up appointments per 10/26 los. Patient is aware. 

## 2021-03-08 ENCOUNTER — Other Ambulatory Visit: Payer: Self-pay | Admitting: *Deleted

## 2021-03-08 DIAGNOSIS — I890 Lymphedema, not elsewhere classified: Secondary | ICD-10-CM

## 2021-03-14 NOTE — Progress Notes (Signed)
[facility] [place]  ASSESSMENT / PLAN: 70 y.o. female with chronic venous insufficiency of bilateral lower extremities. Venous duplex shows no evidence of superficial venous reflux. Recommend compression, elevation and weight loss. Will refer to bariatric surgery. Follow up with me as needed.  CHIEF COMPLAINT: bilateral leg swelling  HISTORY OF PRESENT ILLNESS: Megan Fischer is a 70 y.o. female referred to clinic for evaluation of bilateral lower extremity leg swelling.  Patient reports her legs swell and ache post days.  The pain worsens as the day goes on.  Usually resolves by the morning time..  She is limited in her mobility, and ambulates limited amount with a cane.  She is morbidly obese.  She has not tried compression or elevation.  Past Medical History:  Diagnosis Date   Atrial fibrillation ([place])    Chronic disease anemia 03/29/2011   Diabetes mellitus    Dyslipidemia    Hypertension    Obesities, morbid ([place])    Osteoarthritis     Past Surgical History:  Procedure Laterality Date   CHOLECYSTECTOMY     REPLACEMENT TOTAL KNEE BILATERAL     SHOULDER SURGERY     RIGHT SHOULDER   TUBAL LIGATION      Family History  Problem Relation Age of Onset   Heart attack Mother    Stroke Mother    Diabetes Mother    Hypertension Brother    Breast cancer Neg Hx     Social History   Socioeconomic History   Marital status: Married    Spouse name: Not on file   Number of children: Not on file   Years of education: Not on file   Highest education level: Not on file  Occupational History   Not on file  Tobacco Use   Smoking status: Never   Smokeless tobacco: Never  Vaping Use   Vaping Use: Never used  Substance and Sexual Activity   Alcohol use: No   Drug use: No   Sexual activity: Not Currently  Other Topics Concern   Not on file  Social History Narrative   Not on file   Social Determinants of Health   Financial Resource Strain: Not on file   Food Insecurity: Not on file  Transportation Needs: Not on file  Physical Activity: Not on file  Stress: Not on file  Social Connections: Not on file  Intimate Partner Violence: Not on file    Allergies  Allergen Reactions   Aspirin     Other reaction(s): Unknown   Codeine    Fentanyl Other (See Comments)    hypoxia   Oxycodone Hcl    Penicillins    Chlorhexidine Gluconate Rash    Current Outpatient Medications  Medication Sig Dispense Refill   apixaban (ELIQUIS) 5 MG TABS tablet TAKE 1 TABLET BY MOUTH  TWICE DAILY 180 tablet 1   benazepril-hydrochlorthiazide (LOTENSIN HCT) 10-12.5 MG tablet TAKE 1 TABLET BY MOUTH  DAILY 90 tablet 3   Blood Glucose Monitoring Suppl (ONETOUCH VERIO IQ SYSTEM) W/DEVICE KIT Use to check blood sugar 2 times per day dx code 250.02 1 kit 0   CALCIUM PO Take by mouth 2 (two) times daily.     cloNIDine (CATAPRES-TTS-2) 0.2 mg/24hr patch Place 1 patch (0.2 mg total) onto the skin once a week. 4 patch 2   diltiazem (CARDIZEM CD) 360 MG 24 hr capsule TAKE 1 CAPSULE BY MOUTH  DAILY 90 capsule 3   ergocalciferol (VITAMIN D2) 50000 UNITS capsule Take  50,000 Units by mouth once a week.     ferrous sulfate 325 (65 FE) MG EC tablet Take 1 tablet (325 mg total) by mouth 2 (two) times daily. 60 tablet 3   gabapentin (NEURONTIN) 300 MG capsule Take 2 capsules by mouth 3 (three) times daily.     metFORMIN (GLUCOPHAGE-XR) 500 MG 24 hr tablet TAKE 4 TABLETS BY MOUTH  DAILY WITH SUPPER. 360 tablet 3   metoprolol succinate (TOPROL-XL) 100 MG 24 hr tablet TAKE 1 TABLET BY MOUTH  DAILY WITH OR IMMEDIATELY  FOLLOWING A MEAL 90 tablet 3   ONETOUCH VERIO test strip USE AS DIRECTED TO CHECK  BLOOD SUGAR 2 TIMES DAILY 200 strip 3   rosuvastatin (CRESTOR) 10 MG tablet TAKE 1 TABLET BY MOUTH  DAILY 90 tablet 3   Semaglutide, 1 MG/DOSE, (OZEMPIC, 1 MG/DOSE,) 2 MG/1.5ML SOPN Inject 1 mg into the skin once a week. 9 mL 3   No current facility-administered medications for this  visit.    REVIEW OF SYSTEMS:  [X]  denotes positive finding, [ ]  denotes negative finding Cardiac  Comments:  Chest pain or chest pressure:    Shortness of breath upon exertion:    Short of breath when lying flat:    Irregular heart rhythm:        Vascular    Pain in calf, thigh, or hip brought on by ambulation:    Pain in feet at night that wakes you up from your sleep:     Blood clot in your veins:    Leg swelling:         Pulmonary    Oxygen at home:    Productive cough:     Wheezing:         Neurologic    Sudden weakness in arms or legs:     Sudden numbness in arms or legs:     Sudden onset of difficulty speaking or slurred speech:    Temporary loss of vision in one eye:     Problems with dizziness:         Gastrointestinal    Blood in stool:     Vomited blood:         Genitourinary    Burning when urinating:     Blood in urine:        Psychiatric    Major depression:         Hematologic    Bleeding problems:    Problems with blood clotting too easily:        Skin    Rashes or ulcers:        Constitutional    Fever or chills:      PHYSICAL EXAM Vitals:   03/15/21 0936  BP: 134/78  Pulse: 75  Resp: 20  Temp: 98.2 F (36.8 C)  SpO2: 98%  Weight: 229 lb (103.9 kg)  Height: 5' 3"  (1.6 m)    Constitutional: Elderly. In no distress. Morbidly obese.   Neurologic: CN intact. no focal findings. no sensory loss. Psychiatric:  Mood and affect symmetric and appropriate. Eyes:  No icterus. No conjunctival pallor. Ears, nose, throat:  mucous membranes moist. Midline trachea.  Cardiac: regular rate and rhythm.  Respiratory:  unlabored. Abdominal: obese, soft, non-tender, non-distended.  Peripheral vascular: 2+ edema about bilateral lower extremities from ankles to thighs. DP palpable bilaterally. Extremity:  no cyanosis. no pallor.  Skin: no gangrene. no ulceration.  Lymphatic: no Stemmer's sign. no palpable lymphadenopathy.  PERTINENT LABORATORY AND  RADIOLOGIC DATA  Most recent CBC CBC Latest Ref Rng & Units 03/02/2021 03/02/2021 11/12/2020  WBC 4.0 - 10.5 K/uL 4.9 - 8.7  Hemoglobin 12.0 - 15.0 g/dL 10.9(L) - 11.0(L)  Hematocrit 34.0 - 46.6 % 33.3(L) 35.7 34.1(L)  Platelets 150 - 400 K/uL 214 - 246     Most recent CMP CMP Latest Ref Rng & Units 03/02/2021 12/24/2020 11/12/2020  Glucose 70 - 99 mg/dL 111(H) 91 148(H)  BUN 8 - 23 mg/dL 26(H) 37(H) 33(H)  Creatinine 0.44 - 1.00 mg/dL 1.41(H) 1.27(H) 1.26(H)  Sodium 135 - 145 mmol/L 144 140 137  Potassium 3.5 - 5.1 mmol/L 3.5 3.9 4.2  Chloride 98 - 111 mmol/L 109 105 103  CO2 22 - 32 mmol/L 22 24 25   Calcium 8.9 - 10.3 mg/dL 9.8 10.2 9.9  Total Protein 6.5 - 8.1 g/dL 7.8 7.5 8.0  Total Bilirubin 0.3 - 1.2 mg/dL 0.5 0.7 1.0  Alkaline Phos 38 - 126 U/L 91 73 77  AST 15 - 41 U/L 24 22 37  ALT 0 - 44 U/L 16 14 18     Renal function Estimated Creatinine Clearance: 42.8 mL/min (A) (by C-G formula based on SCr of 1.41 mg/dL (H)).  Hgb A1c MFr Bld (%)  Date Value  12/24/2020 6.6 (H)    LDL Cholesterol  Date Value Ref Range Status  12/24/2020 65 0 - 99 mg/dL Final     LLE venous reflux study: Left:  - No evidence of deep vein thrombosis from the common femoral through the  popliteal veins.  - No evidence of superficial venous thrombosis.  - The deep venous system is competent.  - The great and small saphenous veins are competent.  - The anterior accessory branch is not competent.      Yevonne Aline. Stanford Breed, MD [facility] Phone Number: [phone] 03/15/2021 12:27 PM  Total time spent on preparing this encounter including chart review, data review, collecting history, examining the patient, coordinating care for this new patient, 45 minutes.  Portions of this report may have been transcribed using voice recognition software.  Every effort has been made to ensure accuracy; however, inadvertent computerized transcription errors may still be  present.

## 2021-03-15 ENCOUNTER — Ambulatory Visit (HOSPITAL_COMMUNITY)
Admission: RE | Admit: 2021-03-15 | Discharge: 2021-03-15 | Disposition: A | Discharge status: Home / Self Care | Payer: Medicare Other | Source: Ambulatory Visit | Attending: Vascular Surgery | Admitting: Vascular Surgery

## 2021-03-15 ENCOUNTER — Other Ambulatory Visit: Payer: Self-pay

## 2021-03-15 ENCOUNTER — Ambulatory Visit (INDEPENDENT_AMBULATORY_CARE_PROVIDER_SITE_OTHER): Payer: Medicare Other | Admitting: Vascular Surgery

## 2021-03-15 ENCOUNTER — Encounter: Payer: Self-pay | Admitting: Vascular Surgery

## 2021-03-15 VITALS — BP 134/78 | HR 75 | Temp 98.2°F | Resp 20 | Ht 63.0 in | Wt 229.0 lb

## 2021-03-15 DIAGNOSIS — M7989 Other specified soft tissue disorders: Secondary | ICD-10-CM

## 2021-03-15 DIAGNOSIS — I872 Venous insufficiency (chronic) (peripheral): Secondary | ICD-10-CM | POA: Diagnosis not present

## 2021-03-15 DIAGNOSIS — I890 Lymphedema, not elsewhere classified: Secondary | ICD-10-CM | POA: Insufficient documentation

## 2021-04-10 ENCOUNTER — Emergency Department (HOSPITAL_COMMUNITY): Payer: Medicare Other

## 2021-04-10 ENCOUNTER — Other Ambulatory Visit: Payer: Self-pay

## 2021-04-10 ENCOUNTER — Inpatient Hospital Stay (HOSPITAL_COMMUNITY)
Admission: EM | Admit: 2021-04-10 | Discharge: 2021-04-12 | DRG: 291 | Disposition: A | Discharge status: Home Health | Payer: Medicare Other | Attending: Internal Medicine | Admitting: Internal Medicine

## 2021-04-10 ENCOUNTER — Encounter (HOSPITAL_COMMUNITY): Payer: Self-pay | Admitting: *Deleted

## 2021-04-10 DIAGNOSIS — Z823 Family history of stroke: Secondary | ICD-10-CM

## 2021-04-10 DIAGNOSIS — I5033 Acute on chronic diastolic (congestive) heart failure: Secondary | ICD-10-CM | POA: Diagnosis not present

## 2021-04-10 DIAGNOSIS — L299 Pruritus, unspecified: Secondary | ICD-10-CM | POA: Diagnosis present

## 2021-04-10 DIAGNOSIS — Z96653 Presence of artificial knee joint, bilateral: Secondary | ICD-10-CM | POA: Diagnosis present

## 2021-04-10 DIAGNOSIS — J9601 Acute respiratory failure with hypoxia: Secondary | ICD-10-CM | POA: Diagnosis present

## 2021-04-10 DIAGNOSIS — I130 Hypertensive heart and chronic kidney disease with heart failure and stage 1 through stage 4 chronic kidney disease, or unspecified chronic kidney disease: Principal | ICD-10-CM | POA: Diagnosis present

## 2021-04-10 DIAGNOSIS — I509 Heart failure, unspecified: Secondary | ICD-10-CM

## 2021-04-10 DIAGNOSIS — Z7901 Long term (current) use of anticoagulants: Secondary | ICD-10-CM

## 2021-04-10 DIAGNOSIS — I10 Essential (primary) hypertension: Secondary | ICD-10-CM | POA: Diagnosis present

## 2021-04-10 DIAGNOSIS — E1129 Type 2 diabetes mellitus with other diabetic kidney complication: Secondary | ICD-10-CM | POA: Diagnosis present

## 2021-04-10 DIAGNOSIS — E785 Hyperlipidemia, unspecified: Secondary | ICD-10-CM | POA: Diagnosis present

## 2021-04-10 DIAGNOSIS — Z79899 Other long term (current) drug therapy: Secondary | ICD-10-CM

## 2021-04-10 DIAGNOSIS — D638 Anemia in other chronic diseases classified elsewhere: Secondary | ICD-10-CM | POA: Diagnosis present

## 2021-04-10 DIAGNOSIS — D631 Anemia in chronic kidney disease: Secondary | ICD-10-CM | POA: Diagnosis present

## 2021-04-10 DIAGNOSIS — Z833 Family history of diabetes mellitus: Secondary | ICD-10-CM

## 2021-04-10 DIAGNOSIS — I959 Hypotension, unspecified: Secondary | ICD-10-CM | POA: Diagnosis present

## 2021-04-10 DIAGNOSIS — Z7984 Long term (current) use of oral hypoglycemic drugs: Secondary | ICD-10-CM

## 2021-04-10 DIAGNOSIS — Z20822 Contact with and (suspected) exposure to covid-19: Secondary | ICD-10-CM | POA: Diagnosis present

## 2021-04-10 DIAGNOSIS — E1122 Type 2 diabetes mellitus with diabetic chronic kidney disease: Secondary | ICD-10-CM | POA: Diagnosis present

## 2021-04-10 DIAGNOSIS — N1831 Chronic kidney disease, stage 3a: Secondary | ICD-10-CM | POA: Diagnosis present

## 2021-04-10 DIAGNOSIS — Z6841 Body Mass Index (BMI) 40.0 and over, adult: Secondary | ICD-10-CM

## 2021-04-10 DIAGNOSIS — I4891 Unspecified atrial fibrillation: Secondary | ICD-10-CM | POA: Diagnosis present

## 2021-04-10 DIAGNOSIS — Z8249 Family history of ischemic heart disease and other diseases of the circulatory system: Secondary | ICD-10-CM

## 2021-04-10 LAB — CBC WITH DIFFERENTIAL/PLATELET
Abs Immature Granulocytes: 0.02 10*3/uL (ref 0.00–0.07)
Basophils Absolute: 0 10*3/uL (ref 0.0–0.1)
Basophils Relative: 0 %
Eosinophils Absolute: 0.2 10*3/uL (ref 0.0–0.5)
Eosinophils Relative: 3 %
HCT: 31.2 % — ABNORMAL LOW (ref 36.0–46.0)
Hemoglobin: 9.7 g/dL — ABNORMAL LOW (ref 12.0–15.0)
Immature Granulocytes: 0 %
Lymphocytes Relative: 30 %
Lymphs Abs: 2.3 10*3/uL (ref 0.7–4.0)
MCH: 27.8 pg (ref 26.0–34.0)
MCHC: 31.1 g/dL (ref 30.0–36.0)
MCV: 89.4 fL (ref 80.0–100.0)
Monocytes Absolute: 0.4 10*3/uL (ref 0.1–1.0)
Monocytes Relative: 5 %
Neutro Abs: 4.8 10*3/uL (ref 1.7–7.7)
Neutrophils Relative %: 62 %
Platelets: 228 10*3/uL (ref 150–400)
RBC: 3.49 MIL/uL — ABNORMAL LOW (ref 3.87–5.11)
RDW: 18.1 % — ABNORMAL HIGH (ref 11.5–15.5)
WBC: 7.7 10*3/uL (ref 4.0–10.5)
nRBC: 0 % (ref 0.0–0.2)

## 2021-04-10 MED ORDER — SODIUM CHLORIDE 0.9 % IV SOLN
1.0000 g | Freq: Once | INTRAVENOUS | Status: AC
  Administered 2021-04-11: 1 g via INTRAVENOUS
  Filled 2021-04-10: qty 10

## 2021-04-10 MED ORDER — IPRATROPIUM-ALBUTEROL 0.5-2.5 (3) MG/3ML IN SOLN
3.0000 mL | Freq: Once | RESPIRATORY_TRACT | Status: AC
  Administered 2021-04-11: 3 mL via RESPIRATORY_TRACT
  Filled 2021-04-10: qty 3

## 2021-04-10 MED ORDER — AZITHROMYCIN 250 MG PO TABS
500.0000 mg | ORAL_TABLET | Freq: Once | ORAL | Status: AC
  Administered 2021-04-11: 500 mg via ORAL
  Filled 2021-04-10: qty 2

## 2021-04-10 MED ORDER — ALBUTEROL SULFATE HFA 108 (90 BASE) MCG/ACT IN AERS: 2.0000 | INHALATION_SPRAY | RESPIRATORY_TRACT | Status: DC | PRN

## 2021-04-10 NOTE — ED Notes (Signed)
Pt was placed on 2L [place] and repositioned after spo2 dropped to 86% on RA

## 2021-04-10 NOTE — ED Provider Notes (Signed)
[facility] DEPT Provider Note   CSN: [id number] Arrival date & time: 04/10/21  2236     History Chief Complaint  Patient presents with   Shortness of Breath    Megan Fischer is a 70 y.o. female.  The history is provided by the patient and medical records.  Shortness of Breath Megan Fischer is a 70 y.o. female who presents to the Emergency Department complaining of sob.  She presents to the ED by EMS for evaluation of sob.  Around 8 pm she started having sob with wheezing with itching on the bottom of her feet.  She was treated by EMS with benadryl 50 mg IV, albuterol, epi IM.  She reports itching has resolved but still feels sob/wheezy.  She has been experiencing episodes daily (usually twice daily) since October.  No new medications.  No fever.  Has associated intermittent central to left sided chest pain - none currently.  Has cough, nonproductive.  No N/V/D.  No abdominal pain.  Has acute on chronic lower extremity edema, worse since October.  Lives alone.  Does not use oxygen.      Past Medical History:  Diagnosis Date   Atrial fibrillation ([place])    Chronic disease anemia 03/29/2011   Diabetes mellitus    Dyslipidemia    Hypertension    Obesities, morbid ([place])    Osteoarthritis     Patient Active Problem List   Diagnosis Date Noted   Stage 3a chronic kidney disease (CKD) ([place]) 04/11/2021   Acute respiratory failure with hypoxia ([place]) 04/11/2021   Acute on chronic diastolic CHF (congestive heart failure) ([place]) 04/11/2021   Hypercholesterolemia 09/17/2018   Iron deficiency anemia 06/24/2017   Anemia of chronic disease 07/20/2016   Multinodular goiter 01/20/2013   Chronic disease anemia 03/29/2011   Obesities, morbid ([place])    Hypertension    Dyslipidemia    DM (diabetes mellitus) type II controlled with renal manifestation ([facility])    Osteoarthritis    Atrial fibrillation ([place]) 08/04/2010    Past Surgical History:  Procedure Laterality  Date   CHOLECYSTECTOMY     REPLACEMENT TOTAL KNEE BILATERAL     SHOULDER SURGERY     RIGHT SHOULDER   TUBAL LIGATION       OB History   No obstetric history on file.     Family History  Problem Relation Age of Onset   Heart attack Mother    Stroke Mother    Diabetes Mother    Hypertension Brother    Breast cancer Neg Hx     Social History   Tobacco Use   Smoking status: Never   Smokeless tobacco: Never  Vaping Use   Vaping Use: Never used  Substance Use Topics   Alcohol use: No   Drug use: No    Home Medications Prior to Admission medications   Medication Sig Start Date End Date Taking? Authorizing Provider  apixaban (ELIQUIS) 5 MG TABS tablet TAKE 1 TABLET BY MOUTH  TWICE DAILY 11/22/20   Nahser, Wonda Cheng, MD  benazepril-hydrochlorthiazide (LOTENSIN HCT) 10-12.5 MG tablet TAKE 1 TABLET BY MOUTH  DAILY 05/19/20   Elayne Snare, MD  Blood Glucose Monitoring Suppl (ONETOUCH VERIO IQ SYSTEM) W/DEVICE KIT Use to check blood sugar 2 times per day dx code 250.02 09/12/13   Elayne Snare, MD  CALCIUM PO Take by mouth 2 (two) times daily.    [provider]  cloNIDine (CATAPRES-TTS-2) 0.2 mg/24hr patch Place 1 patch (0.2 mg  total) onto the skin once a week. 03/30/14   Elayne Snare, MD  diltiazem (CARDIZEM CD) 360 MG 24 hr capsule TAKE 1 CAPSULE BY MOUTH  DAILY 02/03/21   Elayne Snare, MD  ergocalciferol (VITAMIN D2) 50000 UNITS capsule Take 50,000 Units by mouth once a week.    [provider]  ferrous sulfate 325 (65 FE) MG EC tablet Take 1 tablet (325 mg total) by mouth 2 (two) times daily. 09/27/14   [name], MD  gabapentin (NEURONTIN) 300 MG capsule Take 2 capsules by mouth 3 (three) times daily. 06/17/20   [provider]  metFORMIN (GLUCOPHAGE-XR) 500 MG 24 hr tablet TAKE 4 TABLETS BY MOUTH  DAILY WITH SUPPER. 11/22/20   Elayne Snare, MD  metoprolol succinate (TOPROL-XL) 100 MG 24 hr tablet TAKE 1 TABLET BY MOUTH  DAILY WITH OR IMMEDIATELY  FOLLOWING A MEAL  11/22/20   Elayne Snare, MD  [facility] VERIO test strip USE AS DIRECTED TO CHECK  BLOOD SUGAR 2 TIMES DAILY 02/03/21   Elayne Snare, MD  rosuvastatin (CRESTOR) 10 MG tablet TAKE 1 TABLET BY MOUTH  DAILY 05/19/20   Elayne Snare, MD  Semaglutide, 1 MG/DOSE, (OZEMPIC, 1 MG/DOSE,) 2 MG/1.5ML SOPN Inject 1 mg into the skin once a week. 09/20/20   Elayne Snare, MD    Allergies    Aspirin, Codeine, Fentanyl, Oxycodone hcl, Penicillins, and Chlorhexidine gluconate  Review of Systems   Review of Systems  Respiratory:  Positive for shortness of breath.   All other systems reviewed and are negative.  Physical Exam Updated Vital Signs BP 125/75   Pulse 74   Temp 97.7 F (36.5 C) (Oral)   Resp 15   SpO2 96%   Physical Exam Vitals and nursing note reviewed.  Constitutional:      Appearance: She is well-developed.  HENT:     Head: Normocephalic and atraumatic.  Cardiovascular:     Rate and Rhythm: Normal rate. Rhythm irregular.     Heart sounds: No murmur heard. Pulmonary:     Effort: Pulmonary effort is normal. No respiratory distress.     Comments: Decreased air movement bilaterally, crackles in the right lung base Abdominal:     Palpations: Abdomen is soft.     Tenderness: There is no abdominal tenderness. There is no guarding or rebound.  Musculoskeletal:        General: No tenderness.     Comments: Nonpitting edema to BLE  Skin:    General: Skin is warm and dry.  Neurological:     Mental Status: She is alert and oriented to person, place, and time.  Psychiatric:        Behavior: Behavior normal.    ED Results / Procedures / Treatments   Labs (all labs ordered are listed, but only abnormal results are displayed) Labs Reviewed  COMPREHENSIVE METABOLIC PANEL - Abnormal; Notable for the following components:      Result Value   Glucose, Bld 314 (*)    BUN 33 (*)    Creatinine, Ser 1.24 (*)    AST 48 (*)    GFR, Estimated 47 (*)    All other components within normal limits  BRAIN  NATRIURETIC PEPTIDE - Abnormal; Notable for the following components:   B Natriuretic Peptide 560.1 (*)    All other components within normal limits  CBC WITH DIFFERENTIAL/PLATELET - Abnormal; Notable for the following components:   RBC 3.49 (*)    Hemoglobin 9.7 (*)    HCT 31.2 (*)  RDW 18.1 (*)    All other components within normal limits  CBG MONITORING, ED - Abnormal; Notable for the following components:   Glucose-Capillary 115 (*)    All other components within normal limits  RESP PANEL BY RT-PCR (FLU A&B, COVID) ARPGX2  CULTURE, BLOOD (ROUTINE X 2)  CULTURE, BLOOD (ROUTINE X 2)  LACTIC ACID, PLASMA  LACTIC ACID, PLASMA  HEMOGLOBIN A1C  HIV ANTIBODY (ROUTINE TESTING W REFLEX)  BASIC METABOLIC PANEL  CBC  PROCALCITONIN  TROPONIN I (HIGH SENSITIVITY)  TROPONIN I (HIGH SENSITIVITY)    EKG None  Radiology DG Chest 2 View  Result Date: 04/10/2021 CLINICAL DATA:  Shortness of breath with increasing pruritus. EXAM: CHEST - 2 VIEW COMPARISON:  Portable chest 11/12/2020 FINDINGS: There is mild cardiomegaly with increased vascular distension and flow cephalization. There is increased interstitial consolidation with a lower zonal predominance most likely due to edema. There patchy opacities of the mid to lower lungs which could be airspace edema, pneumonia or combination. Lung apices are clear of focal opacities with COPD change. There is calcification in the aortic arch. Mild dextroscoliosis and degenerative change thoracic spine and postsurgical changes of the right shoulder. IMPRESSION: Increased vascular and interstitial prominence, probably due to CHF or fluid overload. Patchy opacities in the mid to lower lungs could reflect airspace edema, pneumonia or combination. Clinical correlation and radiographic follow-up recommended. In all other respects no further changes. Electronically Signed   By: Telford Nab M.D.   On: 04/10/2021 23:18    Procedures Procedures   Medications  Ordered in ED Medications  apixaban (ELIQUIS) tablet 5 mg (has no administration in time range)  insulin aspart (novoLOG) injection 0-9 Units (has no administration in time range)  insulin aspart (novoLOG) injection 0-5 Units (0 Units Subcutaneous Not Given 04/11/21 0259)  furosemide (LASIX) injection 40 mg (has no administration in time range)  sodium chloride flush (NS) 0.9 % injection 3 mL (3 mLs Intravenous Not Given 04/11/21 0155)  acetaminophen (TYLENOL) tablet 650 mg (has no administration in time range)    Or  acetaminophen (TYLENOL) suppository 650 mg (has no administration in time range)  cefTRIAXone (ROCEPHIN) 1 g in sodium chloride 0.9 % 100 mL IVPB (0 g Intravenous Stopped 04/11/21 0300)  ipratropium-albuterol (DUONEB) 0.5-2.5 (3) MG/3ML nebulizer solution 3 mL (3 mLs Nebulization Given 04/11/21 0141)  azithromycin (ZITHROMAX) tablet 500 mg (500 mg Oral Given 04/11/21 0210)  furosemide (LASIX) injection 40 mg (40 mg Intravenous Given 04/11/21 0145)    ED Course  I have reviewed the triage vital signs and the nursing notes.  Pertinent labs & imaging results that were available during my care of the patient were reviewed by me and considered in my medical decision making (see chart for details).    MDM Rules/Calculators/A&P                          patient presents the emergency department by EMS for evaluation of shortness of breath and itching on her feet. These episodes have been waxing and waning since October of this year. She was treated with epi prior to ED arrival. On examination she is in no acute distress. She does have diminished air movement and crackles in the lung bases. She does have lower extremity edema. When she is taken off of oxygen her sats drop to 86%. Chest x-ray with pulmonary vascular congestion, possible multifocal pneumonia. She was treated empirically with antibiotics pending further workup. Labs significant  for elevation in her BNP. CBC with chronic anemia.  BMP with stable renal function. She was treated with a dose of Lasix for CHF. Given new oxygen requirement in setting of pulmonary edema plan to admit for ongoing treatment. Patient updated of findings of studies recommendation for admission and she is in agreement with treatment plan. Hospitalist consulted for admission.  Final Clinical Impression(s) / ED Diagnoses Final diagnoses:  Acute congestive heart failure, unspecified heart failure type [facility])    Rx / [place] Orders ED Discharge Orders     None        Quintella Reichert, MD 04/11/21 0400

## 2021-04-10 NOTE — ED Triage Notes (Signed)
Pt is here from home by ems.  Pt called ems for suspected medication allergy.  Pt is unsure what medication she is allergic to but has had some itching and sob.  No new medications but symptoms have been increasing. Pt has albuterol neb, 50 benadryl IV and epi IM from ems and felt relief.

## 2021-04-10 NOTE — ED Notes (Signed)
Lab tubes sent to lab at this time.  Pt being taken to radiology

## 2021-04-10 NOTE — ED Notes (Signed)
Call to lab to ensure labs are run from tubes that were previously sent

## 2021-04-11 ENCOUNTER — Encounter (HOSPITAL_COMMUNITY): Payer: Self-pay | Admitting: Family Medicine

## 2021-04-11 DIAGNOSIS — Z96653 Presence of artificial knee joint, bilateral: Secondary | ICD-10-CM | POA: Diagnosis present

## 2021-04-11 DIAGNOSIS — J9601 Acute respiratory failure with hypoxia: Secondary | ICD-10-CM | POA: Diagnosis present

## 2021-04-11 DIAGNOSIS — Z8249 Family history of ischemic heart disease and other diseases of the circulatory system: Secondary | ICD-10-CM | POA: Diagnosis not present

## 2021-04-11 DIAGNOSIS — I130 Hypertensive heart and chronic kidney disease with heart failure and stage 1 through stage 4 chronic kidney disease, or unspecified chronic kidney disease: Secondary | ICD-10-CM | POA: Diagnosis present

## 2021-04-11 DIAGNOSIS — I4891 Unspecified atrial fibrillation: Secondary | ICD-10-CM

## 2021-04-11 DIAGNOSIS — L299 Pruritus, unspecified: Secondary | ICD-10-CM | POA: Diagnosis present

## 2021-04-11 DIAGNOSIS — I10 Essential (primary) hypertension: Secondary | ICD-10-CM

## 2021-04-11 DIAGNOSIS — I5033 Acute on chronic diastolic (congestive) heart failure: Secondary | ICD-10-CM | POA: Diagnosis present

## 2021-04-11 DIAGNOSIS — E785 Hyperlipidemia, unspecified: Secondary | ICD-10-CM | POA: Diagnosis present

## 2021-04-11 DIAGNOSIS — Z833 Family history of diabetes mellitus: Secondary | ICD-10-CM | POA: Diagnosis not present

## 2021-04-11 DIAGNOSIS — I959 Hypotension, unspecified: Secondary | ICD-10-CM | POA: Diagnosis present

## 2021-04-11 DIAGNOSIS — D638 Anemia in other chronic diseases classified elsewhere: Secondary | ICD-10-CM

## 2021-04-11 DIAGNOSIS — Z7984 Long term (current) use of oral hypoglycemic drugs: Secondary | ICD-10-CM | POA: Diagnosis not present

## 2021-04-11 DIAGNOSIS — E1122 Type 2 diabetes mellitus with diabetic chronic kidney disease: Secondary | ICD-10-CM | POA: Diagnosis present

## 2021-04-11 DIAGNOSIS — N1831 Chronic kidney disease, stage 3a: Secondary | ICD-10-CM

## 2021-04-11 DIAGNOSIS — Z20822 Contact with and (suspected) exposure to covid-19: Secondary | ICD-10-CM | POA: Diagnosis present

## 2021-04-11 DIAGNOSIS — N183 Chronic kidney disease, stage 3 unspecified: Secondary | ICD-10-CM

## 2021-04-11 DIAGNOSIS — Z79899 Other long term (current) drug therapy: Secondary | ICD-10-CM | POA: Diagnosis not present

## 2021-04-11 DIAGNOSIS — D631 Anemia in chronic kidney disease: Secondary | ICD-10-CM | POA: Diagnosis present

## 2021-04-11 DIAGNOSIS — Z823 Family history of stroke: Secondary | ICD-10-CM | POA: Diagnosis not present

## 2021-04-11 DIAGNOSIS — Z7901 Long term (current) use of anticoagulants: Secondary | ICD-10-CM | POA: Diagnosis not present

## 2021-04-11 DIAGNOSIS — Z6841 Body Mass Index (BMI) 40.0 and over, adult: Secondary | ICD-10-CM | POA: Diagnosis not present

## 2021-04-11 HISTORY — DX: Acute on chronic diastolic (congestive) heart failure: I50.33

## 2021-04-11 HISTORY — DX: Acute respiratory failure with hypoxia: J96.01

## 2021-04-11 LAB — RESP PANEL BY RT-PCR (FLU A&B, COVID) ARPGX2
Influenza A by PCR: NEGATIVE
Influenza B by PCR: NEGATIVE
SARS Coronavirus 2 by RT PCR: NEGATIVE

## 2021-04-11 LAB — TROPONIN I (HIGH SENSITIVITY)
Troponin I (High Sensitivity): 10 ng/L (ref <18)
Troponin I (High Sensitivity): 13 ng/L (ref <18)

## 2021-04-11 LAB — CBG MONITORING, ED
Glucose-Capillary: 115 mg/dL — ABNORMAL HIGH (ref 70–99)
Glucose-Capillary: 87 mg/dL (ref 70–99)
Glucose-Capillary: 150 mg/dL — ABNORMAL HIGH (ref 70–99)
Glucose-Capillary: 112 mg/dL — ABNORMAL HIGH (ref 70–99)

## 2021-04-11 LAB — COMPREHENSIVE METABOLIC PANEL
ALT: 26 U/L (ref 0–44)
AST: 48 U/L — ABNORMAL HIGH (ref 15–41)
Albumin: 3.7 g/dL (ref 3.5–5.0)
Alkaline Phosphatase: 81 U/L (ref 38–126)
Anion gap: 8 (ref 5–15)
BUN: 33 mg/dL — ABNORMAL HIGH (ref 8–23)
CO2: 25 mmol/L (ref 22–32)
Calcium: 9.4 mg/dL (ref 8.9–10.3)
Chloride: 108 mmol/L (ref 98–111)
Creatinine, Ser: 1.24 mg/dL — ABNORMAL HIGH (ref 0.44–1.00)
GFR, Estimated: 47 mL/min — ABNORMAL LOW (ref >60)
Glucose, Bld: 314 mg/dL — ABNORMAL HIGH (ref 70–99)
Potassium: 4.2 mmol/L (ref 3.5–5.1)
Sodium: 141 mmol/L (ref 135–145)
Total Bilirubin: 0.6 mg/dL (ref 0.3–1.2)
Total Protein: 7.6 g/dL (ref 6.5–8.1)

## 2021-04-11 LAB — BASIC METABOLIC PANEL
Anion gap: 6 (ref 5–15)
BUN: 31 mg/dL — ABNORMAL HIGH (ref 8–23)
CO2: 29 mmol/L (ref 22–32)
Calcium: 9.3 mg/dL (ref 8.9–10.3)
Chloride: 107 mmol/L (ref 98–111)
Creatinine, Ser: 1.22 mg/dL — ABNORMAL HIGH (ref 0.44–1.00)
GFR, Estimated: 48 mL/min — ABNORMAL LOW (ref >60)
Glucose, Bld: 99 mg/dL (ref 70–99)
Potassium: 4.1 mmol/L (ref 3.5–5.1)
Sodium: 142 mmol/L (ref 135–145)

## 2021-04-11 LAB — GLUCOSE, CAPILLARY: Glucose-Capillary: 127 mg/dL — ABNORMAL HIGH (ref 70–99)

## 2021-04-11 LAB — CBC
HCT: 29.3 % — ABNORMAL LOW (ref 36.0–46.0)
Hemoglobin: 9.2 g/dL — ABNORMAL LOW (ref 12.0–15.0)
MCH: 28 pg (ref 26.0–34.0)
MCHC: 31.4 g/dL (ref 30.0–36.0)
MCV: 89.1 fL (ref 80.0–100.0)
Platelets: 207 10*3/uL (ref 150–400)
RBC: 3.29 MIL/uL — ABNORMAL LOW (ref 3.87–5.11)
RDW: 18.1 % — ABNORMAL HIGH (ref 11.5–15.5)
WBC: 6.5 10*3/uL (ref 4.0–10.5)
nRBC: 0 % (ref 0.0–0.2)

## 2021-04-11 LAB — LACTIC ACID, PLASMA: Lactic Acid, Venous: 1.5 mmol/L (ref 0.5–1.9)

## 2021-04-11 LAB — PROCALCITONIN: Procalcitonin: <0.1 ng/mL

## 2021-04-11 LAB — HIV ANTIBODY (ROUTINE TESTING W REFLEX): HIV Screen 4th Generation wRfx: NONREACTIVE

## 2021-04-11 LAB — BRAIN NATRIURETIC PEPTIDE: B Natriuretic Peptide: 560.1 pg/mL — ABNORMAL HIGH (ref 0.0–100.0)

## 2021-04-11 LAB — HEMOGLOBIN A1C
Hgb A1c MFr Bld: 7.2 % — ABNORMAL HIGH (ref 4.8–5.6)
Mean Plasma Glucose: 159.94 mg/dL

## 2021-04-11 MED ORDER — INSULIN ASPART 100 UNIT/ML IJ SOLN
0.0000 [IU] | Freq: Every day | INTRAMUSCULAR | Status: DC
Start: 2021-04-11 — End: 2021-04-12
  Filled 2021-04-11: qty 0.05

## 2021-04-11 MED ORDER — SODIUM CHLORIDE 0.9% FLUSH
3.0000 mL | Freq: Two times a day (BID) | INTRAVENOUS | Status: DC
  Administered 2021-04-11 – 2021-04-12 (×2): 3 mL via INTRAVENOUS

## 2021-04-11 MED ORDER — GABAPENTIN 300 MG PO CAPS
300.0000 mg | ORAL_CAPSULE | Freq: Once | ORAL | Status: AC
  Administered 2021-04-11: 300 mg via ORAL
  Filled 2021-04-11: qty 1

## 2021-04-11 MED ORDER — FUROSEMIDE 10 MG/ML IJ SOLN
40.0000 mg | Freq: Once | INTRAMUSCULAR | Status: AC
  Administered 2021-04-11: 40 mg via INTRAVENOUS
  Filled 2021-04-11: qty 4

## 2021-04-11 MED ORDER — ACETAMINOPHEN 650 MG RE SUPP: 650.0000 mg | Freq: Four times a day (QID) | RECTAL | Status: DC | PRN

## 2021-04-11 MED ORDER — FUROSEMIDE 10 MG/ML IJ SOLN
40.0000 mg | Freq: Two times a day (BID) | INTRAMUSCULAR | Status: DC
  Administered 2021-04-11 – 2021-04-12 (×2): 40 mg via INTRAVENOUS
  Filled 2021-04-11 (×2): qty 4

## 2021-04-11 MED ORDER — METOPROLOL SUCCINATE ER 50 MG PO TB24
50.0000 mg | ORAL_TABLET | Freq: Every day | ORAL | Status: DC
  Administered 2021-04-11 – 2021-04-12 (×2): 50 mg via ORAL
  Filled 2021-04-11 (×2): qty 1

## 2021-04-11 MED ORDER — APIXABAN 5 MG PO TABS
5.0000 mg | ORAL_TABLET | Freq: Two times a day (BID) | ORAL | Status: DC
  Administered 2021-04-11 – 2021-04-12 (×3): 5 mg via ORAL
  Filled 2021-04-11 (×4): qty 1

## 2021-04-11 MED ORDER — INSULIN ASPART 100 UNIT/ML IJ SOLN
0.0000 [IU] | Freq: Three times a day (TID) | INTRAMUSCULAR | Status: DC
  Administered 2021-04-11: 1 [IU] via SUBCUTANEOUS
  Administered 2021-04-12: 2 [IU] via SUBCUTANEOUS
  Filled 2021-04-11: qty 0.09

## 2021-04-11 MED ORDER — ACETAMINOPHEN 325 MG PO TABS: 650.0000 mg | ORAL_TABLET | Freq: Four times a day (QID) | ORAL | Status: DC | PRN

## 2021-04-11 MED ORDER — ROSUVASTATIN CALCIUM 10 MG PO TABS
10.0000 mg | ORAL_TABLET | Freq: Every day | ORAL | Status: DC
  Administered 2021-04-11 – 2021-04-12 (×2): 10 mg via ORAL
  Filled 2021-04-11 (×2): qty 1

## 2021-04-11 NOTE — ED Notes (Signed)
No second lactic acid drawn as first was WNL

## 2021-04-11 NOTE — H&P (Signed)
History and Physical    Megan Fischer MGN:[id number] DOB: Oct 02, 1950 DOA: 04/10/2021  PCP: [name], MD   Patient coming from: Home   Chief Complaint: SOB, itchy feet   HPI: Megan Fischer is a 70 y.o. female with medical history significant for type 2 diabetes mellitus, hypertension, atrial fibrillation on Eliquis, CKD 3A, BMI 41, and chronic anemia, now presenting to the emergency department for evaluation of shortness of breath and pruritus.  Patient reports insidiously worsening dyspnea and lower extremity swelling since September.  She has developed orthopnea.  She usually only coughs at night.  She has had increased itching involving the bottom of her feet for 2 or 3 months but denies any rash or hives.  She denies fevers or chills.  She was seen by vascular surgery for her leg swelling, had venous duplex, but no evidence of venous reflux.  She reports being diagnosed with heart failure in September but does not believe that she is taking any diuretics.  She reports taking Eliquis, does not believe any of her medications have changed recently, but is unsure of the names of her other medications.  Her shortness of breath and pruritus worsened significantly last night without hives, lightheadedness, nausea or vomiting, or stridor.  EMS was called and administered IV Benadryl, IM epinephrine, and albuterol prior to arrival in the ED.  ED Course: Upon arrival to the ED, patient is found to be afebrile, saturating 86% on room air, slightly tachypneic, and with stable blood pressure.  EKG features atrial fibrillation and chest x-ray demonstrates increased vascular congestion and interstitial prominence likely due to CHF.  Chemistry panel notable for glucose 314 and creatinine 1.24.  CBC with hemoglobin 9.7.  Troponin was normal and BNP 560.  Patient was treated with IV Lasix, Rocephin, and azithromycin in the ED.  She was started on 2 L/min of supplemental oxygen.  Review of Systems:  All other  systems reviewed and apart from HPI, are negative.  Past Medical History:  Diagnosis Date   Atrial fibrillation ([place])    Chronic disease anemia 03/29/2011   Diabetes mellitus    Dyslipidemia    Hypertension    Obesities, morbid ([place])    Osteoarthritis     Past Surgical History:  Procedure Laterality Date   CHOLECYSTECTOMY     REPLACEMENT TOTAL KNEE BILATERAL     SHOULDER SURGERY     RIGHT SHOULDER   TUBAL LIGATION      Social History:   reports that she has never smoked. She has never used smokeless tobacco. She reports that she does not drink alcohol and does not use drugs.  Allergies  Allergen Reactions   Aspirin     Other reaction(s): Unknown   Codeine    Fentanyl Other (See Comments)    hypoxia   Oxycodone Hcl    Penicillins    Chlorhexidine Gluconate Rash    Family History  Problem Relation Age of Onset   Heart attack Mother    Stroke Mother    Diabetes Mother    Hypertension Brother    Breast cancer Neg Hx      Prior to Admission medications   Medication Sig Start Date End Date Taking? Authorizing Provider  apixaban (ELIQUIS) 5 MG TABS tablet TAKE 1 TABLET BY MOUTH  TWICE DAILY 11/22/20   Nahser, Wonda Cheng, MD  benazepril-hydrochlorthiazide (LOTENSIN HCT) 10-12.5 MG tablet TAKE 1 TABLET BY MOUTH  DAILY 05/19/20   Elayne Snare, MD  Blood Glucose Monitoring Suppl (  ONETOUCH VERIO IQ SYSTEM) W/DEVICE KIT Use to check blood sugar 2 times per day dx code 250.02 09/12/13   Elayne Snare, MD  CALCIUM PO Take by mouth 2 (two) times daily.    [provider]  cloNIDine (CATAPRES-TTS-2) 0.2 mg/24hr patch Place 1 patch (0.2 mg total) onto the skin once a week. 03/30/14   Elayne Snare, MD  diltiazem (CARDIZEM CD) 360 MG 24 hr capsule TAKE 1 CAPSULE BY MOUTH  DAILY 02/03/21   Elayne Snare, MD  ergocalciferol (VITAMIN D2) 50000 UNITS capsule Take 50,000 Units by mouth once a week.    [provider]  ferrous sulfate 325 (65 FE) MG EC tablet Take 1 tablet (325 mg  total) by mouth 2 (two) times daily. 09/27/14   [name], MD  gabapentin (NEURONTIN) 300 MG capsule Take 2 capsules by mouth 3 (three) times daily. 06/17/20   [provider]  metFORMIN (GLUCOPHAGE-XR) 500 MG 24 hr tablet TAKE 4 TABLETS BY MOUTH  DAILY WITH SUPPER. 11/22/20   Elayne Snare, MD  metoprolol succinate (TOPROL-XL) 100 MG 24 hr tablet TAKE 1 TABLET BY MOUTH  DAILY WITH OR IMMEDIATELY  FOLLOWING A MEAL 11/22/20   Elayne Snare, MD  [facility] VERIO test strip USE AS DIRECTED TO CHECK  BLOOD SUGAR 2 TIMES DAILY 02/03/21   Elayne Snare, MD  rosuvastatin (CRESTOR) 10 MG tablet TAKE 1 TABLET BY MOUTH  DAILY 05/19/20   Elayne Snare, MD  Semaglutide, 1 MG/DOSE, (OZEMPIC, 1 MG/DOSE,) 2 MG/1.5ML SOPN Inject 1 mg into the skin once a week. 09/20/20   Elayne Snare, MD    Physical Exam: Vitals:   04/11/21 0015 04/11/21 0030 04/11/21 0045 04/11/21 0100  BP: 131/70 133/72 130/73 125/75  Pulse: 65 66 63 74  Resp: _0 Temp:      TempSrc:      SpO2: 94% 98% 97% 96%    Constitutional: NAD, calm  Eyes: PERTLA, lids and conjunctivae normal ENMT: Mucous membranes are moist. Posterior pharynx clear of any exudate or lesions.   Neck: supple, no masses  Respiratory: Dyspneic with speech. No wheezing. Fine rales bilateral bases. No accessory muscle use.  Cardiovascular: S1 & S2 heard, regular rate and rhythm. Leg swelling bilaterally.  Abdomen: No distension, no tenderness, soft. Bowel sounds active.  Musculoskeletal: no clubbing / cyanosis. No joint deformity upper and lower extremities.   Skin: no significant rashes, lesions, ulcers. Warm, dry, well-perfused. Neurologic: CN 2-12 grossly intact. Moving all extremities. Alert and oriented.  Psychiatric: Calm. Cooperative.    Labs and Imaging on Admission: I have personally reviewed following labs and imaging studies  CBC: Recent Labs  Lab 04/10/21 2259  WBC 7.7  NEUTROABS 4.8  HGB 9.7*  HCT 31.2*  MCV 89.4  PLT 947   Basic  Metabolic Panel: Recent Labs  Lab 04/10/21 2259  NA 141  K 4.2  CL 108  CO2 25  GLUCOSE 314*  BUN 33*  CREATININE 1.24*  CALCIUM 9.4   GFR: CrCl cannot be calculated (Unknown ideal weight.). Liver Function Tests: Recent Labs  Lab 04/10/21 2259  AST 48*  ALT 26  ALKPHOS 81  BILITOT 0.6  PROT 7.6  ALBUMIN 3.7   No results for input(s): LIPASE, AMYLASE in the last 168 hours. No results for input(s): AMMONIA in the last 168 hours. Coagulation Profile: No results for input(s): INR, PROTIME in the last 168 hours. Cardiac Enzymes: No results for input(s): CKTOTAL, CKMB, CKMBINDEX, TROPONINI in the last  168 hours. BNP (last 3 results) No results for input(s): PROBNP in the last 8760 hours. HbA1C: No results for input(s): HGBA1C in the last 72 hours. CBG: No results for input(s): GLUCAP in the last 168 hours. Lipid Profile: No results for input(s): CHOL, HDL, LDLCALC, TRIG, CHOLHDL, LDLDIRECT in the last 72 hours. Thyroid Function Tests: No results for input(s): TSH, T4TOTAL, FREET4, T3FREE, THYROIDAB in the last 72 hours. Anemia Panel: No results for input(s): VITAMINB12, FOLATE, FERRITIN, TIBC, IRON, RETICCTPCT in the last 72 hours. Urine analysis:    Component Value Date/Time   COLORURINE YELLOW 09/17/2019 0805   APPEARANCEUR CLEAR 09/17/2019 0805   LABSPEC 1.025 09/17/2019 0805   PHURINE 6.0 09/17/2019 0805   GLUCOSEU NEGATIVE 09/17/2019 0805   HGBUR NEGATIVE 09/17/2019 0805   BILIRUBINUR NEGATIVE 09/17/2019 0805   KETONESUR NEGATIVE 09/17/2019 0805   PROTEINUR NEGATIVE 10/17/2006 2053   UROBILINOGEN 0.2 09/17/2019 0805   NITRITE NEGATIVE 09/17/2019 0805   LEUKOCYTESUR NEGATIVE 09/17/2019 0805   Sepsis Labs: _0 (procalcitonin:4,lacticidven:4) ) Recent Results (from the past 240 hour(s))  Resp Panel by RT-PCR (Flu A&B, Covid) Nasopharyngeal Swab     Status: None   Collection Time: 04/11/21 12:32 AM   Specimen: Nasopharyngeal Swab; Nasopharyngeal(NP)  swabs in vial transport medium  Result Value Ref Range Status   SARS Coronavirus 2 by RT PCR NEGATIVE NEGATIVE Final    Comment: (NOTE) SARS-CoV-2 target nucleic acids are NOT DETECTED.  The SARS-CoV-2 RNA is generally detectable in upper respiratory specimens during the acute phase of infection. The lowest concentration of SARS-CoV-2 viral copies this assay can detect is 138 copies/mL. A negative result does not preclude SARS-Cov-2 infection and should not be used as the sole basis for treatment or other patient management decisions. A negative result may occur with  improper specimen collection/handling, submission of specimen other than nasopharyngeal swab, presence of viral mutation(s) within the areas targeted by this assay, and inadequate number of viral copies(<138 copies/mL). A negative result must be combined with clinical observations, patient history, and epidemiological information. The expected result is Negative.  Fact Sheet for Patients:  EntrepreneurPulse.com.au  Fact Sheet for Healthcare Providers:  IncredibleEmployment.be  This test is no t yet approved or cleared by the [place] FDA and  has been authorized for detection and/or diagnosis of SARS-CoV-2 by FDA under an Emergency Use Authorization (EUA). This EUA will remain  in effect (meaning this test can be used) for the duration of the COVID-19 declaration under Section 564(b)(1) of the Act, 21 U.S.C.section 360bbb-3(b)(1), unless the authorization is terminated  or revoked sooner.       Influenza A by PCR NEGATIVE NEGATIVE Final   Influenza B by PCR NEGATIVE NEGATIVE Final    Comment: (NOTE) The Xpert Xpress SARS-CoV-2/FLU/RSV plus assay is intended as an aid in the diagnosis of influenza from Nasopharyngeal swab specimens and should not be used as a sole basis for treatment. Nasal washings and aspirates are unacceptable for Xpert Xpress  SARS-CoV-2/FLU/RSV testing.  Fact Sheet for Patients: EntrepreneurPulse.com.au  Fact Sheet for Healthcare Providers: IncredibleEmployment.be  This test is not yet approved or cleared by the [place] FDA and has been authorized for detection and/or diagnosis of SARS-CoV-2 by FDA under an Emergency Use Authorization (EUA). This EUA will remain in effect (meaning this test can be used) for the duration of the COVID-19 declaration under Section 564(b)(1) of the Act, 21 U.S.C. section 360bbb-3(b)(1), unless the authorization is terminated or revoked.  Performed at [facility],  [place] [address] [place], [place] [address]      Radiological Exams on Admission: DG Chest 2 View  Result Date: 04/10/2021 CLINICAL DATA:  Shortness of breath with increasing pruritus. EXAM: CHEST - 2 VIEW COMPARISON:  Portable chest 11/12/2020 FINDINGS: There is mild cardiomegaly with increased vascular distension and flow cephalization. There is increased interstitial consolidation with a lower zonal predominance most likely due to edema. There patchy opacities of the mid to lower lungs which could be airspace edema, pneumonia or combination. Lung apices are clear of focal opacities with COPD change. There is calcification in the aortic arch. Mild dextroscoliosis and degenerative change thoracic spine and postsurgical changes of the right shoulder. IMPRESSION: Increased vascular and interstitial prominence, probably due to CHF or fluid overload. Patchy opacities in the mid to lower lungs could reflect airspace edema, pneumonia or combination. Clinical correlation and radiographic follow-up recommended. In all other respects no further changes. Electronically Signed   By: Telford Nab M.D.   On: 04/10/2021 23:18    EKG: Independently reviewed. Atrial fibrillation.   Assessment/Plan   1. Acute on chronic diastolic CHF; acute hypoxic respiratory failure  -  Presents with acutely worsening SOB after ~3 months of increasing leg swelling, orthopnea, and DOE   - BNP is increased to 560 and CXR findings consistent with CHF  - Oxygen saturation was 86% on rm air while at rest in ED  - EF was 60-65% in Oct 2022  - She was given IV Lasix, [place], and azithromycin in ED  - Continue diuresis with 40 mg IV Lasix q12h, monitor daily wt and I/Os, monitor renal function and electrolytes   2. Atrial fibrillation  - In rate-controlled a fib on admission, continue Eliquis    3. Type II DM  - A1c was 6.6% in August 2022  - Pharmacy medication reconciliation pending, use low-intensity SSI for now   4. Hypertension  - Plan to continue home medications pending pharmacy medication reconciliation    5. Anemia  - Hgb is 9.7 on admission, down from 10.9 in October  - No overt bleeding, on Eliquis, monitor   6. CKD IIia  - SCr is 1.24 on admission, similar to priors  - Renally-dose medications, monitor while diuresing    DVT prophylaxis: Eliquis  Code Status: Full  Level of Care: Level of care: Telemetry Family Communication: Daughter upated at bedside Disposition Plan:  Patient is from: Home  Anticipated d/c is to: Home  Anticipated d/c date is: 04/14/21 Patient currently: Pending improved respiratory status  Consults called: none  Admission status: Inpatient     Vianne Bulls, MD [facility]  04/11/2021, 1:49 AM

## 2021-04-11 NOTE — ED Notes (Signed)
Pt ambulated to bathroom on her own °

## 2021-04-11 NOTE — ED Notes (Signed)
No second BC drawn (per protocol as not to delay abt any further)

## 2021-04-11 NOTE — Progress Notes (Signed)
70 year old female with history of type 2 diabetes stage IIIa CKD atrial fibrillation on Eliquis hypertension admitted with increasing shortness of breath and orthopnea and is found to be in heart failure.  Her BNP was 560.  Chest x-ray findings consistent with fluid overload.  Oxygen saturation 86% on room air at rest.  Her last echo was in October 2022 with a EF of 60 to 65%.  Continue Eliquis continue Lasix monitor renal functions.  Patient seen in the ER daughter at the bedside asking for a case manager consult.  Will get physical therapy to see her follow-up renal functions in AM.  Patient was admitted by Dr. Antionette Char this morning.

## 2021-04-11 NOTE — ED Notes (Signed)
Checked pt and found that her purewick had leaked.  Removed wet items and placed pt on new chuck, new gown and sheet.  Repositioned for comfort. Daughter remains at the bedside.

## 2021-04-12 ENCOUNTER — Ambulatory Visit: Payer: Medicare Other

## 2021-04-12 DIAGNOSIS — I5033 Acute on chronic diastolic (congestive) heart failure: Secondary | ICD-10-CM | POA: Diagnosis not present

## 2021-04-12 LAB — GLUCOSE, CAPILLARY
Glucose-Capillary: 153 mg/dL — ABNORMAL HIGH (ref 70–99)
Glucose-Capillary: 106 mg/dL — ABNORMAL HIGH (ref 70–99)

## 2021-04-12 LAB — BASIC METABOLIC PANEL
Anion gap: 9 (ref 5–15)
BUN: 34 mg/dL — ABNORMAL HIGH (ref 8–23)
CO2: 30 mmol/L (ref 22–32)
Calcium: 9 mg/dL (ref 8.9–10.3)
Chloride: 101 mmol/L (ref 98–111)
Creatinine, Ser: 1.24 mg/dL — ABNORMAL HIGH (ref 0.44–1.00)
GFR, Estimated: 47 mL/min — ABNORMAL LOW (ref >60)
Glucose, Bld: 118 mg/dL — ABNORMAL HIGH (ref 70–99)
Potassium: 3.9 mmol/L (ref 3.5–5.1)
Sodium: 140 mmol/L (ref 135–145)

## 2021-04-12 LAB — CBC
HCT: 30.1 % — ABNORMAL LOW (ref 36.0–46.0)
Hemoglobin: 9.6 g/dL — ABNORMAL LOW (ref 12.0–15.0)
MCH: 27.7 pg (ref 26.0–34.0)
MCHC: 31.9 g/dL (ref 30.0–36.0)
MCV: 86.7 fL (ref 80.0–100.0)
Platelets: 240 10*3/uL (ref 150–400)
RBC: 3.47 MIL/uL — ABNORMAL LOW (ref 3.87–5.11)
RDW: 18.2 % — ABNORMAL HIGH (ref 11.5–15.5)
WBC: 6.3 10*3/uL (ref 4.0–10.5)
nRBC: 0 % (ref 0.0–0.2)

## 2021-04-12 MED ORDER — BENAZEPRIL HCL 10 MG PO TABS: 10.0000 mg | ORAL_TABLET | Freq: Every day | ORAL | 11 refills | Status: DC

## 2021-04-12 MED ORDER — FUROSEMIDE 40 MG PO TABS: 40.0000 mg | ORAL_TABLET | Freq: Every day | ORAL | 2 refills | Status: DC

## 2021-04-12 MED ORDER — METOPROLOL SUCCINATE ER 50 MG PO TB24: 50.0000 mg | ORAL_TABLET | Freq: Every day | ORAL | 2 refills | Status: DC

## 2021-04-12 NOTE — Care Management CC44 (Signed)
Condition Code 44 Documentation Completed  Patient Details  Name: Megan Fischer MRN: [id number] Date of Birth: Jun 02, 1950   Condition Code 44 given:  Yes Patient signature on Condition Code 44 notice:  Yes Documentation of 2 MD's agreement:  Yes Code 44 added to claim:  Yes    Golda Acre, RN 04/12/2021, 2:25 PM

## 2021-04-12 NOTE — TOC Initial Note (Addendum)
Transition of Care [facility]) - Initial/Assessment Note    Patient Details  Name: Megan Fischer MRN: [id number] Date of Birth: Feb 26, 1951  Transition of Care [facility]) CM/SW Contact:    Golda Acre, RN Phone Number: 04/12/2021, 8:16 AM  Clinical Narrative:                 Congested heart failure:  request for home heart failure screening sent to [facility] at 0816 [facility] accepted request for home heart failure program. Expected Discharge Plan: Home w Home Health Services Barriers to Discharge: Continued Medical Work up   Patient Goals and CMS Choice Patient states their goals for this hospitalization and ongoing recovery are:: to go home CMS Medicare.gov Compare Post Acute Care list provided to:: Patient    Expected Discharge Plan and Services Expected Discharge Plan: Home w Home Health Services   Discharge Planning Services: CM Consult Post Acute Care Choice: Home Health Living arrangements for the past 2 months: Single Family Home                           HH Arranged: Disease Management HH Agency: [facility] Date [facility] Agency Contacted: 04/12/21 Time HH Agency Contacted: [phone] Representative spoke with at [facility] Agency: stacie  Prior Living Arrangements/Services Living arrangements for the past 2 months: Single Family Home Lives with:: Spouse Patient language and need for interpreter reviewed:: Yes Do you feel safe going back to the place where you live?: Yes      Need for Family Participation in Patient Care: Yes (Comment) Care giver support system in place?: Yes (comment)   Criminal Activity/Legal Involvement Pertinent to Current Situation/Hospitalization: No - Comment as needed  Activities of Daily Living Home Assistive Devices/Equipment: Eyeglasses, Cane (specify quad or straight), Walker (specify type), CBG Meter (single point cane, front wheeled walker) ADL Screening (condition at time of admission) Patient's cognitive ability adequate to safely  complete daily activities?: Yes Is the patient deaf or have difficulty hearing?: No Does the patient have difficulty seeing, even when wearing glasses/contacts?: No Does the patient have difficulty concentrating, remembering, or making decisions?: No Patient able to express need for assistance with ADLs?: Yes Does the patient have difficulty dressing or bathing?: Yes (secondary to shortness of breath and left leg weakness) Independently performs ADLs?: No (secondary to shortness of breath and left leg weakness) Communication: Independent Dressing (OT): Needs assistance Is this a change from baseline?: Change from baseline, expected to last >3 days Grooming: Independent Feeding: Needs assistance Is this a change from baseline?: Change from baseline, expected to last >3 days Bathing: Needs assistance Is this a change from baseline?: Change from baseline, expected to last >3 days Toileting: Needs assistance Is this a change from baseline?: Change from baseline, expected to last >3days In/Out Bed: Needs assistance Is this a change from baseline?: Change from baseline, expected to last >3 days Walks in Home: Needs assistance Is this a change from baseline?: Change from baseline, expected to last >3 days Does the patient have difficulty walking or climbing stairs?: Yes (secondary to shortness of breath and left leg weakness) Weakness of Legs: Left (L>R) Weakness of Arms/Hands: None  Permission Sought/Granted                  Emotional Assessment Appearance:: Appears stated age Attitude/Demeanor/Rapport: Engaged Affect (typically observed): Calm Orientation: : Oriented to Place, Oriented to Self, Oriented to  Time, Oriented to Situation Alcohol / Substance Use: Not Applicable  Psych Involvement: No (comment)  Admission diagnosis:  Acute on chronic diastolic CHF (congestive heart failure) (HCC) [I50.33] Acute congestive heart failure, unspecified heart failure type (HCC)  [I50.9] Patient Active Problem List   Diagnosis Date Noted   Stage 3a chronic kidney disease (CKD) (HCC) 04/11/2021   Acute respiratory failure with hypoxia (HCC) 04/11/2021   Acute on chronic diastolic CHF (congestive heart failure) (HCC) 04/11/2021   Hypercholesterolemia 09/17/2018   Iron deficiency anemia 06/24/2017   Anemia of chronic disease 07/20/2016   Multinodular goiter 01/20/2013   Chronic disease anemia 03/29/2011   Obesities, morbid (HCC)    Hypertension    Dyslipidemia    DM (diabetes mellitus) type II controlled with renal manifestation (HCC)    Osteoarthritis    Atrial fibrillation (HCC) 08/04/2010   PCP:  Alysia Penna, MD Pharmacy:   [facility]) [place], [place] - [address] [facility] [address] [place] [address] [place] [address] Phone: [phone] Fax: [phone]  [facility] - [place], [place] - [address] [place] [address] Phone: [phone] Fax: [phone]  [facility] ([facility]) - [place], [place] - [address] [place] [address] Phone: [phone] Fax: [phone]     Social Determinants of Health (SDOH) Interventions    Readmission Risk Interventions No flowsheet data found.

## 2021-04-12 NOTE — Discharge Planning (Signed)
Physician Discharge Summary  Megan Fischer YDX:[id number] DOB: 1950-05-11 DOA: 04/10/2021  PCP: [name], MD  Admit date: 04/10/2021 Discharge date: 04/12/2021  Admitted From: Home  disposition: Home Recommendations for Outpatient Follow-up:  Follow up with PCP in 1-2 weeks Please obtain BMP/CBC in one week Please follow up with cardiology  Metformin stopped due to elevated creatinine HCTZ stopped and started Lasix Metoprolol dose decreased due to low blood pressure  Home Health: None Equipment/Devices: None  Discharge Condition: Stable CODE STATUS: Full code Diet recommendation: Cardiac Brief/Interim Summary:  Megan Fischer is a 70 y.o. female with medical history significant for type 2 diabetes mellitus, hypertension, atrial fibrillation on Eliquis, CKD 3A, BMI 41, and chronic anemia, now presenting to the emergency department for evaluation of shortness of breath and pruritus.  Patient reports insidiously worsening dyspnea and lower extremity swelling since September.  She has developed orthopnea.  She usually only coughs at night.  She has had increased itching involving the bottom of her feet for 2 or 3 months but denies any rash or hives.  She denies fevers or chills.  She was seen by vascular surgery for her leg swelling, had venous duplex, but no evidence of venous reflux.  She reports being diagnosed with heart failure in September but does not believe that she is taking any diuretics.  She reports taking Eliquis, does not believe any of her medications have changed recently, but is unsure of the names of her other medications.  Her shortness of breath and pruritus worsened significantly last night without hives, lightheadedness, nausea or vomiting, or stridor.  EMS was called and administered IV Benadryl, IM epinephrine, and albuterol prior to arrival in the ED.   ED Course: Upon arrival to the ED, patient is found to be afebrile, saturating 86% on room air, slightly  tachypneic, and with stable blood pressure.  EKG features atrial fibrillation and chest x-ray demonstrates increased vascular congestion and interstitial prominence likely due to CHF.  Chemistry panel notable for glucose 314 and creatinine 1.24.  CBC with hemoglobin 9.7.  Troponin was normal and BNP 560.  Patient was treated with IV Lasix, Rocephin, and azithromycin in the ED.  She was started on 2 L/min of supplemental oxygen  Discharge Diagnoses:  Principal Problem:   Acute on chronic diastolic CHF (congestive heart failure) (HCC) Active Problems:   Atrial fibrillation (HCC)   Hypertension   DM (diabetes mellitus) type II controlled with renal manifestation (HCC)   Anemia of chronic disease   Stage 3a chronic kidney disease (CKD) (HCC)   Acute respiratory failure with hypoxia ([place])   1. Acute on chronic diastolic CHF; acute hypoxic respiratory failure  - Presents with acutely worsening SOB after ~3 months of increasing leg swelling, orthopnea, and DOE   - BNP is increased to 560 and CXR findings consistent with CHF  - Oxygen saturation was 86% on rm air while at rest in ED  - EF was 60-65% in Oct 2022  -She was treated with Lasix and discharged on Lasix.  Prior to admission she was on hydrochlorothiazide which was stopped.  Follow-up with cardiology.    2. Atrial fibrillation  - In rate-controlled a fib on admission, continue Eliquis     3. Type II DM  - A1c was 6.6% in August 2022  -Metformin stopped due to elevated creatinine. -Continue Ozempic.  4. Hypertension -continue ACE inhibitor and Lasix and metoprolol.  5. Anemia -stable    6. CKD IIia  - SCr  is 1.24 on admission, stable  Estimated body mass index is 41.83 kg/m as calculated from the following:   Height as of this encounter: 5' 3" (1.6 m).   Weight as of this encounter: 107.1 kg.  Discharge Instructions  Discharge Instructions     Diet - low sodium heart healthy   Complete by: As directed    Increase  activity slowly   Complete by: As directed       Allergies as of 04/12/2021       Reactions   Aspirin    Other reaction(s): Unknown   Codeine    Fentanyl Other (See Comments)   hypoxia   Oxycodone Hcl    Penicillins    Chlorhexidine Gluconate Rash        Medication List     STOP taking these medications    benazepril-hydrochlorthiazide 10-12.5 MG tablet Commonly known as: LOTENSIN HCT   metFORMIN 500 MG 24 hr tablet Commonly known as: GLUCOPHAGE-XR       TAKE these medications    benazepril 10 MG tablet Commonly known as: LOTENSIN Take 1 tablet (10 mg total) by mouth daily.   CALCIUM PO Take by mouth 2 (two) times daily.   diltiazem 360 MG 24 hr capsule Commonly known as: CARDIZEM CD TAKE 1 CAPSULE BY MOUTH  DAILY What changed: how to take this   Eliquis 5 MG Tabs tablet Generic drug: apixaban TAKE 1 TABLET BY MOUTH  TWICE DAILY What changed: how much to take   ergocalciferol 1.25 MG (50000 UT) capsule Commonly known as: VITAMIN D2 Take 50,000 Units by mouth once a week.   ferrous sulfate 325 (65 FE) MG EC tablet Take 1 tablet (325 mg total) by mouth 2 (two) times daily. What changed: when to take this   furosemide 40 MG tablet Commonly known as: Lasix Take 1 tablet (40 mg total) by mouth daily.   gabapentin 300 MG capsule Commonly known as: NEURONTIN Take 600 mg by mouth 3 (three) times daily.   metoprolol succinate 50 MG 24 hr tablet Commonly known as: TOPROL-XL Take 1 tablet (50 mg total) by mouth daily. Take with or immediately following a meal. Start taking on: April 13, 2021 What changed:  medication strength See the new instructions.   OneTouch Verio IQ System w/Device Kit Use to check blood sugar 2 times per day dx code 250.02   OneTouch Verio test strip Generic drug: glucose blood USE AS DIRECTED TO CHECK  BLOOD SUGAR 2 TIMES DAILY   Ozempic (1 MG/DOSE) 2 MG/1.5ML Sopn Generic drug: Semaglutide (1 MG/DOSE) Inject 1 mg  into the skin once a week. What changed: additional instructions   rosuvastatin 10 MG tablet Commonly known as: CRESTOR TAKE 1 TABLET BY MOUTH  DAILY               Durable Medical Equipment  (From admission, onward)           Start     Ordered   04/12/21 1231  For home use only DME 4 wheeled rolling walker with seat  Once       Question:  Patient needs a walker to treat with the following condition  Answer:  Weakness   04/12/21 1231            Follow-up Information     [name], MD Follow up.   Specialty: Internal Medicine Contact information: [address] [place] [address] [phone]         Acie Fredrickson, Arnette Norris  J, MD .   Specialty: Cardiology Contact information: [address] [place] [address] [phone]                Allergies  Allergen Reactions   Aspirin     Other reaction(s): Unknown   Codeine    Fentanyl Other (See Comments)    hypoxia   Oxycodone Hcl    Penicillins    Chlorhexidine Gluconate Rash    Consultations: none   Procedures/Studies: DG Chest 2 View  Result Date: 04/10/2021 CLINICAL DATA:  Shortness of breath with increasing pruritus. EXAM: CHEST - 2 VIEW COMPARISON:  Portable chest 11/12/2020 FINDINGS: There is mild cardiomegaly with increased vascular distension and flow cephalization. There is increased interstitial consolidation with a lower zonal predominance most likely due to edema. There patchy opacities of the mid to lower lungs which could be airspace edema, pneumonia or combination. Lung apices are clear of focal opacities with COPD change. There is calcification in the aortic arch. Mild dextroscoliosis and degenerative change thoracic spine and postsurgical changes of the right shoulder. IMPRESSION: Increased vascular and interstitial prominence, probably due to CHF or fluid overload. Patchy opacities in the mid to lower lungs could reflect airspace edema, pneumonia or  combination. Clinical correlation and radiographic follow-up recommended. In all other respects no further changes. Electronically Signed   By: Telford Nab M.D.   On: 04/10/2021 23:18   VAS [place] LOWER EXTREMITY VENOUS REFLUX  Result Date: 03/15/2021  Lower Venous Reflux Study Patient Name:  LAKE BREEDING  Date of Exam:   03/15/2021 Medical Rec #: [id number]      Accession #:    [id number] Date of Birth: 12/26/1950     Patient Gender: F Patient Age:   39 years Exam Location:  [facility] Procedure:      VAS [place] LOWER EXTREMITY VENOUS REFLUX Referring Phys: Jamelle Haring --------------------------------------------------------------------------------  Indications: Lymphedema of the left lower extremity with pain.  Limitations: Body habitus. Performing Technologist: Ronal Fear RVS, RCS  Examination Guidelines: A complete evaluation includes B-mode imaging, spectral Doppler, color Doppler, and power Doppler as needed of all accessible portions of each vessel. Bilateral testing is considered an integral part of a complete examination. Limited examinations for reoccurring indications may be performed as noted. The reflux portion of the exam is performed with the patient in reverse Trendelenburg. Significant venous reflux is defined as >500 ms in the superficial venous system, and >1 second in the deep venous system.  +------------------+---------+------+-----------+------------+--------+ LEFT              Reflux NoRefluxReflux TimeDiameter cmsComments                             Yes                                  +------------------+---------+------+-----------+------------+--------+ CFV               no                                             +------------------+---------+------+-----------+------------+--------+ FV mid            no                                             +------------------+---------+------+-----------+------------+--------+  Popliteal          no                                             +------------------+---------+------+-----------+------------+--------+ GSV at [facility]                  yes    >500 ms      0.79             +------------------+---------+------+-----------+------------+--------+ GSV prox thigh    no                            0.28             +------------------+---------+------+-----------+------------+--------+ GSV mid thigh     no                            0.33             +------------------+---------+------+-----------+------------+--------+ GSV dist thigh    no                            0.37             +------------------+---------+------+-----------+------------+--------+ GSV at knee       no                            0.28             +------------------+---------+------+-----------+------------+--------+ SSV Pop Fossa     no                            0.30             +------------------+---------+------+-----------+------------+--------+ anterior accessory          yes    >500 ms      0.38             +------------------+---------+------+-----------+------------+--------+   Summary: Left: - No evidence of deep vein thrombosis from the common femoral through the popliteal veins. - No evidence of superficial venous thrombosis. - The deep venous system is competent. - The great and small saphenous veins are competent. - The anterior accessory branch is not competent.  *See table(s) above for measurements and observations. Electronically signed by Jamelle Haring on 03/15/2021 at 5:11:59 PM.    Final    (Echo, Carotid, EGD, Colonoscopy, ERCP)    Subjective: Patient is resting in bed she had a breakfast she denies any chest pain shortness of breath she is not on oxygen she is saturating 95% on room air.  Discharge Exam: Vitals:   04/12/21 0523 04/12/21 1049  BP: (!) 166/87 131/70  Pulse: 68 86  Resp: 17 18  Temp: 98 F (36.7 C) 98.8 F (37.1 C)  SpO2: 96% 94%    Vitals:   04/12/21 0058 04/12/21 0500 04/12/21 0523 04/12/21 1049  BP: (!) 154/75  (!) 166/87 131/70  Pulse: 76  68 86  Resp: _0 Temp: 98.5 F (36.9 C)  98 F (36.7 C) 98.8 F (37.1 C)  TempSrc: Oral  Oral Oral  SpO2: 99%  96% 94%  Weight:  107.1 kg  Height:        General: Pt is alert, awake, not in acute distress Cardiovascular: RRR, S1/S2 +, no rubs, no gallops Respiratory: Diminished breath sounds at the bases bilaterally, no wheezing, no rhonchi Abdominal: Soft, NT, ND, bowel sounds + Extremities: no edema, no cyanosis    The results of significant diagnostics from this hospitalization (including imaging, microbiology, ancillary and laboratory) are listed below for reference.     Microbiology: Recent Results (from the past 240 hour(s))  Resp Panel by RT-PCR (Flu A&B, Covid) Nasopharyngeal Swab     Status: None   Collection Time: 04/11/21 12:32 AM   Specimen: Nasopharyngeal Swab; Nasopharyngeal(NP) swabs in vial transport medium  Result Value Ref Range Status   SARS Coronavirus 2 by RT PCR NEGATIVE NEGATIVE Final    Comment: (NOTE) SARS-CoV-2 target nucleic acids are NOT DETECTED.  The SARS-CoV-2 RNA is generally detectable in upper respiratory specimens during the acute phase of infection. The lowest concentration of SARS-CoV-2 viral copies this assay can detect is 138 copies/mL. A negative result does not preclude SARS-Cov-2 infection and should not be used as the sole basis for treatment or other patient management decisions. A negative result may occur with  improper specimen collection/handling, submission of specimen other than nasopharyngeal swab, presence of viral mutation(s) within the areas targeted by this assay, and inadequate number of viral copies(<138 copies/mL). A negative result must be combined with clinical observations, patient history, and epidemiological information. The expected result is Negative.  Fact Sheet for Patients:   EntrepreneurPulse.com.au  Fact Sheet for Healthcare Providers:  IncredibleEmployment.be  This test is no t yet approved or cleared by the [place] FDA and  has been authorized for detection and/or diagnosis of SARS-CoV-2 by FDA under an Emergency Use Authorization (EUA). This EUA will remain  in effect (meaning this test can be used) for the duration of the COVID-19 declaration under Section 564(b)(1) of the Act, 21 U.S.C.section 360bbb-3(b)(1), unless the authorization is terminated  or revoked sooner.       Influenza A by PCR NEGATIVE NEGATIVE Final   Influenza B by PCR NEGATIVE NEGATIVE Final    Comment: (NOTE) The Xpert Xpress SARS-CoV-2/FLU/RSV plus assay is intended as an aid in the diagnosis of influenza from Nasopharyngeal swab specimens and should not be used as a sole basis for treatment. Nasal washings and aspirates are unacceptable for Xpert Xpress SARS-CoV-2/FLU/RSV testing.  Fact Sheet for Patients: EntrepreneurPulse.com.au  Fact Sheet for Healthcare Providers: IncredibleEmployment.be  This test is not yet approved or cleared by the [place] FDA and has been authorized for detection and/or diagnosis of SARS-CoV-2 by FDA under an Emergency Use Authorization (EUA). This EUA will remain in effect (meaning this test can be used) for the duration of the COVID-19 declaration under Section 564(b)(1) of the Act, 21 U.S.C. section 360bbb-3(b)(1), unless the authorization is terminated or revoked.  Performed at [facility], [place] [address] [place], [place] [address]   Culture, blood (routine x 2)     Status: None (Preliminary result)   Collection Time: 04/11/21  1:48 AM   Specimen: BLOOD  Result Value Ref Range Status   Specimen Description   Final    BLOOD RIGHT ANTECUBITAL Performed at [facility] [address] [place], [place] [address]     Special Requests   Final    BOTTLES DRAWN AEROBIC AND ANAEROBIC Blood Culture results may not be optimal due to an inadequate volume of blood received in culture  bottles Performed at [facility] [address] [place], [place] [address]    Culture   Final    NO GROWTH 1 DAY Performed at [facility], [place] [address] [place], [place] [address]    Report Status PENDING  Incomplete     Labs: BNP (last 3 results) Recent Labs    11/12/20 0100 04/10/21 2259  BNP 467.8* 119.1*   Basic Metabolic Panel: Recent Labs  Lab 04/10/21 2259 04/11/21 0542 04/12/21 0448  NA 141 142 140  K 4.2 4.1 3.9  CL 108 107 101  CO2 _0 GLUCOSE 314* 99 118*  BUN 33* 31* 34*  CREATININE 1.24* 1.22* 1.24*  CALCIUM 9.4 9.3 9.0   Liver Function Tests: Recent Labs  Lab 04/10/21 2259  AST 48*  ALT 26  ALKPHOS 81  BILITOT 0.6  PROT 7.6  ALBUMIN 3.7   No results for input(s): LIPASE, AMYLASE in the last 168 hours. No results for input(s): AMMONIA in the last 168 hours. CBC: Recent Labs  Lab 04/10/21 2259 04/11/21 0542 04/12/21 0448  WBC 7.7 6.5 6.3  NEUTROABS 4.8  --   --   HGB 9.7* 9.2* 9.6*  HCT 31.2* 29.3* 30.1*  MCV 89.4 89.1 86.7  PLT 228 207 240   Cardiac Enzymes: No results for input(s): CKTOTAL, CKMB, CKMBINDEX, TROPONINI in the last 168 hours. BNP: Invalid input(s): POCBNP CBG: Recent Labs  Lab 04/11/21 1213 04/11/21 1654 04/11/21 2042 04/12/21 0755 04/12/21 1209  GLUCAP 150* 112* 127* 153* 106*   D-Dimer No results for input(s): DDIMER in the last 72 hours. Hgb A1c Recent Labs    04/11/21 0542  HGBA1C 7.2*   Lipid Profile No results for input(s): CHOL, HDL, LDLCALC, TRIG, CHOLHDL, LDLDIRECT in the last 72 hours. Thyroid function studies No results for input(s): TSH, T4TOTAL, T3FREE, THYROIDAB in the last 72 hours.  Invalid input(s): FREET3 Anemia work up No results for input(s): VITAMINB12, FOLATE, FERRITIN, TIBC, IRON,  RETICCTPCT in the last 72 hours. Urinalysis    Component Value Date/Time   COLORURINE YELLOW 09/17/2019 0805   APPEARANCEUR CLEAR 09/17/2019 0805   LABSPEC 1.025 09/17/2019 0805   PHURINE 6.0 09/17/2019 0805   GLUCOSEU NEGATIVE 09/17/2019 0805   HGBUR NEGATIVE 09/17/2019 0805   BILIRUBINUR NEGATIVE 09/17/2019 0805   KETONESUR NEGATIVE 09/17/2019 0805   PROTEINUR NEGATIVE 10/17/2006 2053   UROBILINOGEN 0.2 09/17/2019 0805   NITRITE NEGATIVE 09/17/2019 0805   LEUKOCYTESUR NEGATIVE 09/17/2019 0805   Sepsis Labs Invalid input(s): PROCALCITONIN,  WBC,  LACTICIDVEN Microbiology Recent Results (from the past 240 hour(s))  Resp Panel by RT-PCR (Flu A&B, Covid) Nasopharyngeal Swab     Status: None   Collection Time: 04/11/21 12:32 AM   Specimen: Nasopharyngeal Swab; Nasopharyngeal(NP) swabs in vial transport medium  Result Value Ref Range Status   SARS Coronavirus 2 by RT PCR NEGATIVE NEGATIVE Final    Comment: (NOTE) SARS-CoV-2 target nucleic acids are NOT DETECTED.  The SARS-CoV-2 RNA is generally detectable in upper respiratory specimens during the acute phase of infection. The lowest concentration of SARS-CoV-2 viral copies this assay can detect is 138 copies/mL. A negative result does not preclude SARS-Cov-2 infection and should not be used as the sole basis for treatment or other patient management decisions. A negative result may occur with  improper specimen collection/handling, submission of specimen other than nasopharyngeal swab, presence of viral mutation(s) within the areas targeted by this assay, and inadequate number of viral copies(<138 copies/mL).  A negative result must be combined with clinical observations, patient history, and epidemiological information. The expected result is Negative.  Fact Sheet for Patients:  EntrepreneurPulse.com.au  Fact Sheet for Healthcare Providers:  IncredibleEmployment.be  This test is no t yet  approved or cleared by the [place] FDA and  has been authorized for detection and/or diagnosis of SARS-CoV-2 by FDA under an Emergency Use Authorization (EUA). This EUA will remain  in effect (meaning this test can be used) for the duration of the COVID-19 declaration under Section 564(b)(1) of the Act, 21 U.S.C.section 360bbb-3(b)(1), unless the authorization is terminated  or revoked sooner.       Influenza A by PCR NEGATIVE NEGATIVE Final   Influenza B by PCR NEGATIVE NEGATIVE Final    Comment: (NOTE) The Xpert Xpress SARS-CoV-2/FLU/RSV plus assay is intended as an aid in the diagnosis of influenza from Nasopharyngeal swab specimens and should not be used as a sole basis for treatment. Nasal washings and aspirates are unacceptable for Xpert Xpress SARS-CoV-2/FLU/RSV testing.  Fact Sheet for Patients: EntrepreneurPulse.com.au  Fact Sheet for Healthcare Providers: IncredibleEmployment.be  This test is not yet approved or cleared by the [place] FDA and has been authorized for detection and/or diagnosis of SARS-CoV-2 by FDA under an Emergency Use Authorization (EUA). This EUA will remain in effect (meaning this test can be used) for the duration of the COVID-19 declaration under Section 564(b)(1) of the Act, 21 U.S.C. section 360bbb-3(b)(1), unless the authorization is terminated or revoked.  Performed at [facility], [place] [address], [place], [place] [address]   Culture, blood (routine x 2)     Status: None (Preliminary result)   Collection Time: 04/11/21  1:48 AM   Specimen: BLOOD  Result Value Ref Range Status   Specimen Description   Final    BLOOD RIGHT ANTECUBITAL Performed at [facility] [address], [place], [place] [address]    Special Requests   Final    BOTTLES DRAWN AEROBIC AND ANAEROBIC Blood Culture results may not be optimal due to an inadequate volume of blood received  in culture bottles Performed at [facility] [address] [place], [place] [address]    Culture   Final    NO GROWTH 1 DAY Performed at [facility], [place] [address] [place], [place] [address]    Report Status PENDING  Incomplete     Time coordinating discharge:39 minutes  SIGNED:   Georgette Shell, MD  [facility] 04/12/2021, 3:06 PM

## 2021-04-12 NOTE — Progress Notes (Signed)
  Patient arrived on the unit. Ambulate with cane to bed and 1 person assistance. Patient placed on 2L [place] for comfort with SpO2 92-95%. Skin assessment completed and verify with second nurse. Daughter at the bedside with dinner (Sal[place]). Patient tolerated dinner. Orders are placed for patient. Safety assessment completed.

## 2021-04-12 NOTE — TOC Transition Note (Signed)
Transition of Care [facility]) - CM/SW Discharge Note   Patient Details  Name: Megan Fischer MRN: [id number] Date of Birth: 06-23-1950  Transition of Care [facility]) CM/SW Contact:  Golda Acre, RN Phone Number: 04/12/2021, 12:33 PM   Clinical Narrative:    Pt being dcd rollator requested and ordered.  [employer] made aware of order.     Barriers to Discharge: Continued Medical Work up   Patient Goals and CMS Choice Patient states their goals for this hospitalization and ongoing recovery are:: to go home CMS Medicare.gov Compare Post Acute Care list provided to:: Patient    Discharge Placement                       Discharge Plan and Services   Discharge Planning Services: CM Consult Post Acute Care Choice: Home Health                    HH Arranged: Disease Management HH Agency: [facility] Date [facility] Agency Contacted: 04/12/21 Time HH Agency Contacted: [phone] Representative spoke with at [facility] Agency: stacie  Social Determinants of Health (SDOH) Interventions     Readmission Risk Interventions No flowsheet data found.

## 2021-04-12 NOTE — Progress Notes (Signed)
RN reviewed discharge paperwork with pt. All questions addressed. IV removed. Tele removed. Daughter is picking pt up in private vehicle. VSS. No further needs at this time.

## 2021-04-12 NOTE — Care Management Obs Status (Signed)
MEDICARE OBSERVATION STATUS NOTIFICATION   Patient Details  Name: Megan Fischer MRN: [id number] Date of Birth: Sep 16, 1950   Medicare Observation Status Notification Given:  Yes    Golda Acre, RN 04/12/2021, 2:25 PM

## 2021-04-12 NOTE — Progress Notes (Signed)
SATURATION QUALIFICATIONS: (This note is used to comply with regulatory documentation for home oxygen)  Patient Saturations on Room Air at Rest = 95%  Patient Saturations on Room Air while Ambulating = 92%   

## 2021-04-12 NOTE — Evaluation (Signed)
Physical Therapy Evaluation Patient Details Name: Megan Fischer MRN: [id number] DOB: [birth year] Today's Date: 04/12/2021  History of Present Illness  70 y.o. female  presenting to the emergency department for evaluation of shortness of breath and pruritus. Dx of CHF.  Pt with medical history significant for type 2 diabetes mellitus, hypertension, atrial fibrillation on Eliquis, CKD 3A, BMI 41, and chronic anemia.  Clinical Impression  Pt is mobilizing independently. She ambulated 160' with a cane, no loss of balance, SaO2 92-94% on room air, no dyspnea. From a PT standpoint, she is ready to DC home. No further acute PT indicated. Will sign off.        Recommendations for follow up therapy are one component of a multi-disciplinary discharge planning process, led by the attending physician.  Recommendations may be updated based on patient status, additional functional criteria and insurance authorization.  Follow Up Recommendations No PT follow up    Assistance Recommended at Discharge None  Functional Status Assessment Patient has not had a recent decline in their functional status  Equipment Recommendations  Rollator (4 wheels)    Recommendations for Other Services       Precautions / Restrictions Precautions Precautions: None Precaution Comments: denies h/o falls in past 6 months Restrictions Weight Bearing Restrictions: No      Mobility  Bed Mobility Overal bed mobility: Modified Independent             General bed mobility comments: used rail    Transfers Overall transfer level: Modified independent Equipment used: Straight cane               General transfer comment: from elevated bed    Ambulation/Gait Ambulation/Gait assistance: Modified independent (Device/Increase time) Gait Distance (Feet): 160 Feet Assistive device: Straight cane Gait Pattern/deviations: Step-through pattern;Decreased stride length Gait velocity: decr     General Gait  Details: steady with SPC, no loss of balance, SaO2 92-94% on room air walking, no dyspnea  Stairs            Wheelchair Mobility    Modified Rankin (Stroke Patients Only)       Balance Overall balance assessment: Modified Independent                                           Pertinent Vitals/Pain Pain Assessment: No/denies pain    Home Living Family/patient expects to be discharged to:: Private residence Living Arrangements: Alone     Home Access: Level entry       Home Layout: One level Home Equipment: Conservation officer, nature (2 wheels);Cane - single point Additional Comments: walks with a SPC    Prior Function Prior Level of Function : Independent/Modified Independent             Mobility Comments: denies falls in past 6 months, independent with ADLs, sit down into tub to bathe       Hand Dominance        Extremity/Trunk Assessment   Upper Extremity Assessment Upper Extremity Assessment: Overall WFL for tasks assessed    Lower Extremity Assessment Lower Extremity Assessment: LLE deficits/detail LLE Deficits / Details: pt reports h/o 12 surgeries to L knee (TKA and removal of "bone tumors"), knee ext AROM -35*, knee ext strength ~-4/5    Cervical / Trunk Assessment Cervical / Trunk Assessment: Normal  Communication   Communication: No difficulties  Cognition Arousal/Alertness: Awake/alert Behavior  During Therapy: WFL for tasks assessed/performed Overall Cognitive Status: Within Functional Limits for tasks assessed                                          General Comments      Exercises     Assessment/Plan    PT Assessment Patient does not need any further PT services  PT Problem List         PT Treatment Interventions      PT Goals (Current goals can be found in the Care Plan section)  Acute Rehab PT Goals PT Goal Formulation: All assessment and education complete, DC therapy    Frequency      Barriers to discharge        Co-evaluation               AM-PAC PT "6 Clicks" Mobility  Outcome Measure Help needed turning from your back to your side while in a flat bed without using bedrails?: None Help needed moving from lying on your back to sitting on the side of a flat bed without using bedrails?: None Help needed moving to and from a bed to a chair (including a wheelchair)?: None Help needed standing up from a chair using your arms (e.g., wheelchair or bedside chair)?: None Help needed to walk in hospital room?: None Help needed climbing 3-5 steps with a railing? : None 6 Click Score: 24    End of Session Equipment Utilized During Treatment: Gait belt Activity Tolerance: Patient tolerated treatment well Patient left: in chair;with call bell/phone within reach Nurse Communication: Mobility status      Time: 9163-8466 PT Time Calculation (min) (ACUTE ONLY): 14 min   Charges:   PT Evaluation $PT Eval Low Complexity: 1 Low         Tamala Ser PT 04/12/2021  Acute Rehabilitation Services Pager [phone] Office [phone]

## 2021-04-12 NOTE — Plan of Care (Signed)

## 2021-04-16 LAB — CULTURE, BLOOD (ROUTINE X 2): Culture: NO GROWTH

## 2021-04-21 NOTE — Discharge Summary (Signed)
Physician Discharge Summary  Megan Fischer [id number] DOB: 1951/01/18 DOA: 04/10/2021  PCP: [name], MD  Admit date: 04/10/2021 Discharge date: 04/12/2021 Admitted From: Home  disposition: Home Recommendations for Outpatient Follow-up:  Follow up with PCP in 1-2 weeks Please obtain BMP/CBC in one week Please follow up with cardiology  Metformin stopped due to elevated creatinine HCTZ stopped and started Lasix Metoprolol dose decreased due to low blood pressure   Home Health: None Equipment/Devices: None   Discharge Condition: Stable CODE STATUS: Full code Diet recommendation: Cardiac Brief/Interim Summary:  Megan Fischer is a 70 y.o. female with medical history significant for type 2 diabetes mellitus, hypertension, atrial fibrillation on Eliquis, CKD 3A, BMI 41, and chronic anemia, now presenting to the emergency department for evaluation of shortness of breath and pruritus.  Patient reports insidiously worsening dyspnea and lower extremity swelling since September.  She has developed orthopnea.  She usually only coughs at night.  She has had increased itching involving the bottom of her feet for 2 or 3 months but denies any rash or hives.  She denies fevers or chills.  She was seen by vascular surgery for her leg swelling, had venous duplex, but no evidence of venous reflux.  She reports being diagnosed with heart failure in September but does not believe that she is taking any diuretics.  She reports taking Eliquis, does not believe any of her medications have changed recently, but is unsure of the names of her other medications.  Her shortness of breath and pruritus worsened significantly last night without hives, lightheadedness, nausea or vomiting, or stridor.  EMS was called and administered IV Benadryl, IM epinephrine, and albuterol prior to arrival in the ED.  ED Course: Upon arrival to the ED, patient is found to be afebrile, saturating 86% on room air, slightly  tachypneic, and with stable blood pressure.  EKG features atrial fibrillation and chest x-ray demonstrates increased vascular congestion and interstitial prominence likely due to CHF.  Chemistry panel notable for glucose 314 and creatinine 1.24.  CBC with hemoglobin 9.7.  Troponin was normal and BNP 560.  Patient was treated with IV Lasix, Rocephin, and azithromycin in the ED.  She was started on 2 L/min of supplemental oxygen   Discharge Diagnoses:  Principal Problem:   Acute on chronic diastolic CHF (congestive heart failure) (HCC) Active Problems:   Atrial fibrillation (HCC)   Hypertension   DM (diabetes mellitus) type II controlled with renal manifestation (HCC)   Anemia of chronic disease   Stage 3a chronic kidney disease (CKD) (HCC)   Acute respiratory failure with hypoxia ([place])    1. Acute on chronic diastolic CHF; acute hypoxic respiratory failure  - Presents with acutely worsening SOB after ~3 months of increasing leg swelling, orthopnea, and DOE   - BNP is increased to 560 and CXR findings consistent with CHF  - Oxygen saturation was 86% on rm air while at rest in ED  - EF was 60-65% in Oct 2022  -She was treated with Lasix and discharged on Lasix.  Prior to admission she was on hydrochlorothiazide which was stopped.  Follow-up with cardiology.    2. Atrial fibrillation  - In rate-controlled a fib on admission, continue Eliquis     3. Type II DM  - A1c was 6.6% in August 2022  -Metformin stopped due to elevated creatinine. -Continue Ozempic.   4. Hypertension -continue ACE inhibitor and Lasix and metoprolol.   5. Anemia -stable    6. CKD  IIia  - SCr is 1.24 on admission, stable  Estimated body mass index is 41.83 kg/m as calculated from the following:   Height as of this encounter: 5' 3" (1.6 m).   Weight as of this encounter: 107.1 kg.  Discharge Instructions  Discharge Instructions     Diet - low sodium heart healthy   Complete by: As directed    Increase  activity slowly   Complete by: As directed       Allergies as of 04/12/2021       Reactions   Aspirin    Other reaction(s): Unknown   Codeine    Fentanyl Other (See Comments)   hypoxia   Oxycodone Hcl    Penicillins    Chlorhexidine Gluconate Rash        Medication List     STOP taking these medications    benazepril-hydrochlorthiazide 10-12.5 MG tablet Commonly known as: LOTENSIN HCT   metFORMIN 500 MG 24 hr tablet Commonly known as: GLUCOPHAGE-XR       TAKE these medications    benazepril 10 MG tablet Commonly known as: LOTENSIN Take 1 tablet (10 mg total) by mouth daily.   CALCIUM PO Take by mouth 2 (two) times daily.   diltiazem 360 MG 24 hr capsule Commonly known as: CARDIZEM CD TAKE 1 CAPSULE BY MOUTH  DAILY What changed: how to take this   Eliquis 5 MG Tabs tablet Generic drug: apixaban TAKE 1 TABLET BY MOUTH  TWICE DAILY What changed: how much to take   ergocalciferol 1.25 MG (50000 UT) capsule Commonly known as: VITAMIN D2 Take 50,000 Units by mouth once a week.   ferrous sulfate 325 (65 FE) MG EC tablet Take 1 tablet (325 mg total) by mouth 2 (two) times daily. What changed: when to take this   furosemide 40 MG tablet Commonly known as: Lasix Take 1 tablet (40 mg total) by mouth daily.   gabapentin 300 MG capsule Commonly known as: NEURONTIN Take 600 mg by mouth 3 (three) times daily.   metoprolol succinate 50 MG 24 hr tablet Commonly known as: TOPROL-XL Take 1 tablet (50 mg total) by mouth daily. Take with or immediately following a meal. What changed:  medication strength See the new instructions.   OneTouch Verio IQ System w/Device Kit Use to check blood sugar 2 times per day dx code 250.02   OneTouch Verio test strip Generic drug: glucose blood USE AS DIRECTED TO CHECK  BLOOD SUGAR 2 TIMES DAILY   Ozempic (1 MG/DOSE) 2 MG/1.5ML Sopn Generic drug: Semaglutide (1 MG/DOSE) Inject 1 mg into the skin once a week. What  changed: additional instructions   rosuvastatin 10 MG tablet Commonly known as: CRESTOR TAKE 1 TABLET BY MOUTH  DAILY        Follow-up Information     [name], MD Follow up.   Specialty: Internal Medicine Contact information: [address] [address] [phone]         Nahser, Wonda Cheng, MD .   Specialty: Cardiology Contact information: [address] [place] [address] [phone]                Allergies  Allergen Reactions   Aspirin     Other reaction(s): Unknown   Codeine    Fentanyl Other (See Comments)    hypoxia   Oxycodone Hcl    Penicillins    Chlorhexidine Gluconate Rash    Consultations: none   Procedures/Studies: DG Chest 2  View  Result Date: 04/10/2021 CLINICAL DATA:  Shortness of breath with increasing pruritus. EXAM: CHEST - 2 VIEW COMPARISON:  Portable chest 11/12/2020 FINDINGS: There is mild cardiomegaly with increased vascular distension and flow cephalization. There is increased interstitial consolidation with a lower zonal predominance most likely due to edema. There patchy opacities of the mid to lower lungs which could be airspace edema, pneumonia or combination. Lung apices are clear of focal opacities with COPD change. There is calcification in the aortic arch. Mild dextroscoliosis and degenerative change thoracic spine and postsurgical changes of the right shoulder. IMPRESSION: Increased vascular and interstitial prominence, probably due to CHF or fluid overload. Patchy opacities in the mid to lower lungs could reflect airspace edema, pneumonia or combination. Clinical correlation and radiographic follow-up recommended. In all other respects no further changes. Electronically Signed   By: Telford Nab M.D.   On: 04/10/2021 23:18   (Echo, Carotid, EGD, Colonoscopy, ERCP)    Subjective: Awake alert anxious to go home   Discharge Exam: Vitals:   04/12/21 1049 04/12/21 1517  BP: 131/70 (!)  170/98  Pulse: 86 80  Resp: 18   Temp: 98.8 F (37.1 C)   SpO2: 94% 96%   Vitals:   04/12/21 0500 04/12/21 0523 04/12/21 1049 04/12/21 1517  BP:  (!) 166/87 131/70 (!) 170/98  Pulse:  68 86 80  Resp:  17 18   Temp:  98 F (36.7 C) 98.8 F (37.1 C)   TempSrc:  Oral Oral   SpO2:  96% 94% 96%  Weight: 107.1 kg     Height:        General: Pt is alert, awake, not in acute distress Cardiovascular: RRR, S1/S2 +, no rubs, no gallops Respiratory: CTA bilaterally, no wheezing, no rhonchi Abdominal: Soft, NT, ND, bowel sounds + Extremities: no edema, no cyanosis    The results of significant diagnostics from this hospitalization (including imaging, microbiology, ancillary and laboratory) are listed below for reference.     Microbiology: No results found for this or any previous visit (from the past 240 hour(s)).   Labs: BNP (last 3 results) Recent Labs    11/12/20 0100 04/10/21 2259  BNP 467.8* 381.0*   Basic Metabolic Panel: No results for input(s): NA, K, CL, CO2, GLUCOSE, BUN, CREATININE, CALCIUM, MG, PHOS in the last 168 hours. Liver Function Tests: No results for input(s): AST, ALT, ALKPHOS, BILITOT, PROT, ALBUMIN in the last 168 hours. No results for input(s): LIPASE, AMYLASE in the last 168 hours. No results for input(s): AMMONIA in the last 168 hours. CBC: No results for input(s): WBC, NEUTROABS, HGB, HCT, MCV, PLT in the last 168 hours. Cardiac Enzymes: No results for input(s): CKTOTAL, CKMB, CKMBINDEX, TROPONINI in the last 168 hours. BNP: Invalid input(s): POCBNP CBG: No results for input(s): GLUCAP in the last 168 hours. D-Dimer No results for input(s): DDIMER in the last 72 hours. Hgb A1c No results for input(s): HGBA1C in the last 72 hours. Lipid Profile No results for input(s): CHOL, HDL, LDLCALC, TRIG, CHOLHDL, LDLDIRECT in the last 72 hours. Thyroid function studies No results for input(s): TSH, T4TOTAL, T3FREE, THYROIDAB in the last 72  hours.  Invalid input(s): FREET3 Anemia work up No results for input(s): VITAMINB12, FOLATE, FERRITIN, TIBC, IRON, RETICCTPCT in the last 72 hours. Urinalysis    Component Value Date/Time   COLORURINE YELLOW 09/17/2019 0805   APPEARANCEUR CLEAR 09/17/2019 0805   LABSPEC 1.025 09/17/2019 0805   PHURINE 6.0 09/17/2019 0805   GLUCOSEU NEGATIVE  09/17/2019 0805   HGBUR NEGATIVE 09/17/2019 0805   BILIRUBINUR NEGATIVE 09/17/2019 0805   KETONESUR NEGATIVE 09/17/2019 0805   PROTEINUR NEGATIVE 10/17/2006 2053   UROBILINOGEN 0.2 09/17/2019 0805   NITRITE NEGATIVE 09/17/2019 0805   LEUKOCYTESUR NEGATIVE 09/17/2019 0805   Sepsis Labs Invalid input(s): PROCALCITONIN,  WBC,  LACTICIDVEN Microbiology No results found for this or any previous visit (from the past 240 hour(s)).   Time coordinating discharge:49mnutes  SIGNED:   EGeorgette Shell MD  [facility] 04/21/2021, 12:23 PM

## 2021-04-27 ENCOUNTER — Other Ambulatory Visit: Payer: Self-pay

## 2021-04-27 ENCOUNTER — Other Ambulatory Visit (INDEPENDENT_AMBULATORY_CARE_PROVIDER_SITE_OTHER): Payer: Medicare Other

## 2021-04-27 DIAGNOSIS — R7989 Other specified abnormal findings of blood chemistry: Secondary | ICD-10-CM

## 2021-04-27 DIAGNOSIS — E1165 Type 2 diabetes mellitus with hyperglycemia: Secondary | ICD-10-CM

## 2021-04-27 LAB — BASIC METABOLIC PANEL
BUN: 29 mg/dL — ABNORMAL HIGH (ref 6–23)
CO2: 29 mEq/L (ref 19–32)
Calcium: 10.8 mg/dL — ABNORMAL HIGH (ref 8.4–10.5)
Chloride: 105 mEq/L (ref 96–112)
Creatinine, Ser: 1.16 mg/dL (ref 0.40–1.20)
GFR: 47.89 mL/min — ABNORMAL LOW (ref >60.00)
Glucose, Bld: 123 mg/dL — ABNORMAL HIGH (ref 70–99)
Potassium: 3.8 mEq/L (ref 3.5–5.1)
Sodium: 143 mEq/L (ref 135–145)

## 2021-04-27 LAB — TSH: TSH: 0.22 u[IU]/mL — ABNORMAL LOW (ref 0.35–5.50)

## 2021-04-27 LAB — T3, FREE: T3, Free: 3 pg/mL (ref 2.3–4.2)

## 2021-04-27 LAB — T4, FREE: Free T4: 1.04 ng/dL (ref 0.60–1.60)

## 2021-04-27 LAB — HEMOGLOBIN A1C: Hgb A1c MFr Bld: 7.5 % — ABNORMAL HIGH (ref 4.6–6.5)

## 2021-05-02 ENCOUNTER — Other Ambulatory Visit: Payer: Self-pay | Admitting: Cardiovascular Disease

## 2021-05-02 ENCOUNTER — Other Ambulatory Visit: Payer: Self-pay | Admitting: Endocrinology

## 2021-05-02 DIAGNOSIS — I4891 Unspecified atrial fibrillation: Secondary | ICD-10-CM

## 2021-05-03 ENCOUNTER — Other Ambulatory Visit: Payer: Medicare Other

## 2021-05-03 NOTE — Telephone Encounter (Signed)
Eliquis 5mg  refill request received. Patient is 70 years old, weight-107.1kg, Crea-1.16 on 04/27/2021, Diagnosis-Afib, and last seen by Dr. 04/29/2021 on 08/05/2020. Dose is appropriate based on dosing criteria. Will send in refill to requested pharmacy.

## 2021-05-04 ENCOUNTER — Inpatient Hospital Stay: Payer: Medicare Other | Attending: Nurse Practitioner

## 2021-05-05 ENCOUNTER — Ambulatory Visit
Admission: RE | Admit: 2021-05-05 | Discharge: 2021-05-05 | Disposition: A | Discharge status: Home / Self Care | Payer: Medicare Other | Source: Ambulatory Visit | Attending: Adult Medicine | Admitting: Adult Medicine

## 2021-05-05 ENCOUNTER — Encounter: Payer: Self-pay | Admitting: Hematology

## 2021-05-05 DIAGNOSIS — Z1231 Encounter for screening mammogram for malignant neoplasm of breast: Secondary | ICD-10-CM

## 2021-05-06 ENCOUNTER — Encounter: Payer: Self-pay | Admitting: Endocrinology

## 2021-05-06 ENCOUNTER — Ambulatory Visit (INDEPENDENT_AMBULATORY_CARE_PROVIDER_SITE_OTHER): Payer: Medicare Other | Admitting: Endocrinology

## 2021-05-06 ENCOUNTER — Other Ambulatory Visit: Payer: Self-pay

## 2021-05-06 VITALS — BP 110/80 | HR 83 | Ht 63.0 in | Wt 239.0 lb

## 2021-05-06 DIAGNOSIS — R7989 Other specified abnormal findings of blood chemistry: Secondary | ICD-10-CM

## 2021-05-06 DIAGNOSIS — E7800 Pure hypercholesterolemia, unspecified: Secondary | ICD-10-CM | POA: Diagnosis not present

## 2021-05-06 DIAGNOSIS — E1165 Type 2 diabetes mellitus with hyperglycemia: Secondary | ICD-10-CM

## 2021-05-06 NOTE — Patient Instructions (Addendum)
Metformin 1000mg  twice daily  Check blood sugars on waking up  2-3 days a week  Also check blood sugars about 2 hours after meals and do this after different meals by rotation  Recommended blood sugar levels on waking up are 90-130 and about 2 hours after meal is 130-160  Please bring your blood sugar monitor to each visit, thank you

## 2021-05-06 NOTE — Progress Notes (Signed)
Patient ID: Megan Fischer, female   DOB: [birth year], 70 y.o.   MRN: [id number]    Reason for Appointment: Follow-up  History of Present Illness   Diagnosis: Type 2 DIABETES MELITUS, date of diagnosis:  2000     Previous history: She has been on various regimens of treatment in the past including Byetta and generally has done better with GLP-1 drugs She continues to take low-dose glimepiride which helps with overall control. Usually blood sugars are not variable Because of occasional hypoglycemia her Amaryl was stopped in 03/2014 Was on Victoza since 2013 until 2019  Recent history: Non-insulin hypoglycemic drugs: Jardiance 10 mg daily, metformin ER 500 mg, 4 tablets at dinner (currently not taking), Ozempic 1.0 mg weekly, on Tuesdays   Current blood sugar patterns and problems identified:  Her A1c is higher than usual at 7.5, previously was down to 6.6 Unclear why her A1c is higher  Although she was admitted to the hospital about 4 weeks ago and metformin was stopped her blood sugars have not been consistently high before her admission and right after discharge  She is sometimes eating more fruit especially in the mornings Otherwise may eat a bologna sandwich at breakfast Also likely not been consistent with diet over the holidays and has gained weight  She has not missed any doses of Ozempic Her blood sugar monitoring has been somewhat sporadic She was told to stop metformin in the hospital possibly because of renal dysfunction However she is now taking Jardiance 10 mg daily, has no side effects from this  Dinner 5-6 pm     Side effects from medications: None  Monitors blood glucose: once or twice a day.    Glucometer:  One Touch   Blood sugar data with averages from monitor download and review of printout:   PRE-MEAL Mornings Lunch Dinner Bedtime Overall  Glucose range: 111-153    96-245  Mean/median: 134/126    144   POST-MEAL PC Breakfast PC Lunch PC Dinner   Glucose range: 227 96-245 182  Mean/median:  139    Previously:  PRE-MEAL Morning Lunch Dinner Bedtime Overall  Glucose range: 91-171  104-251  85-251  Mean/median: 110 116 139  123   POST-MEAL PC Breakfast PC Lunch PC Dinner  Glucose range:     Mean/median:   120    Wt Readings from Last 3 Encounters:  05/06/21 239 lb (108.4 kg)  04/12/21 236 lb 1.8 oz (107.1 kg)  03/15/21 229 lb (103.9 kg)   Lab Results  Component Value Date   HGBA1C 7.5 (H) 04/27/2021   HGBA1C 7.2 (H) 04/11/2021   HGBA1C 6.6 (H) 12/24/2020   Lab Results  Component Value Date   MICROALBUR 4.7 (H) 12/24/2020   LDLCALC 65 12/24/2020   CREATININE 1.16 04/27/2021   Other problems discussed today: See review of systems    No visits with results within 1 Week(s) from this visit.  Latest known visit with results is:  Lab on 04/27/2021  Component Date Value Ref Range Status   T3, Free 04/27/2021 3.0  2.3 - 4.2 pg/mL Final   Free T4 04/27/2021 1.04  0.60 - 1.60 ng/dL Final   Comment: Specimens from patients who are undergoing biotin therapy and /or ingesting biotin supplements may contain high levels of biotin.  The higher biotin concentration in these specimens interferes with this Free T4 assay.  Specimens that contain high levels  of biotin may cause false high results for this Free T4 assay.  Please interpret results in light of the total clinical presentation of the patient.     TSH 04/27/2021 0.22 (L)  0.35 - 5.50 uIU/mL Final   Sodium 04/27/2021 143  135 - 145 mEq/L Final   Potassium 04/27/2021 3.8  3.5 - 5.1 mEq/L Final   Chloride 04/27/2021 105  96 - 112 mEq/L Final   CO2 04/27/2021 29  19 - 32 mEq/L Final   Glucose, Bld 04/27/2021 123 (H)  70 - 99 mg/dL Final   BUN 04/27/2021 29 (H)  6 - 23 mg/dL Final   Creatinine, Ser 04/27/2021 1.16  0.40 - 1.20 mg/dL Final   GFR 04/27/2021 47.89 (L)  >60.00 mL/min Final   Calculated using the CKD-EPI Creatinine Equation (2021)   Calcium 04/27/2021 10.8  (H)  8.4 - 10.5 mg/dL Final   Hgb A1c MFr Bld 04/27/2021 7.5 (H)  4.6 - 6.5 % Final   Glycemic Control Guidelines for People with Diabetes:Non Diabetic:  <6%Goal of Therapy: <7%Additional Action Suggested:  >8%     Allergies as of 05/06/2021       Reactions   Aspirin    Other reaction(s): Unknown   Codeine    Fentanyl Other (See Comments)   hypoxia   Oxycodone Hcl    Penicillins    Chlorhexidine Gluconate Rash        Medication List        Accurate as of May 06, 2021 11:10 AM. If you have any questions, ask your nurse or doctor.          benazepril 10 MG tablet Commonly known as: LOTENSIN Take 1 tablet (10 mg total) by mouth daily.   CALCIUM PO Take by mouth 2 (two) times daily.   diltiazem 360 MG 24 hr capsule Commonly known as: CARDIZEM CD TAKE 1 CAPSULE BY MOUTH  DAILY What changed: how to take this   Eliquis 5 MG Tabs tablet Generic drug: apixaban TAKE 1 TABLET BY MOUTH  TWICE DAILY   ergocalciferol 1.25 MG (50000 UT) capsule Commonly known as: VITAMIN D2 Take 50,000 Units by mouth once a week.   ferrous sulfate 325 (65 FE) MG EC tablet Take 1 tablet (325 mg total) by mouth 2 (two) times daily. What changed: when to take this   furosemide 40 MG tablet Commonly known as: Lasix Take 1 tablet (40 mg total) by mouth daily.   gabapentin 300 MG capsule Commonly known as: NEURONTIN Take 600 mg by mouth 3 (three) times daily.   Jardiance 10 MG Tabs tablet Generic drug: empagliflozin Take 10 mg by mouth daily.   metoprolol succinate 50 MG 24 hr tablet Commonly known as: TOPROL-XL Take 1 tablet (50 mg total) by mouth daily. Take with or immediately following a meal.   OneTouch Verio IQ System w/Device Kit Use to check blood sugar 2 times per day dx code 250.02   OneTouch Verio test strip Generic drug: glucose blood USE AS DIRECTED TO CHECK  BLOOD SUGAR 2 TIMES DAILY   Ozempic (1 MG/DOSE) 2 MG/1.5ML Sopn Generic drug: Semaglutide (1  MG/DOSE) Inject 1 mg into the skin once a week. What changed: additional instructions   rosuvastatin 10 MG tablet Commonly known as: CRESTOR TAKE 1 TABLET BY MOUTH  DAILY        Allergies:  Allergies  Allergen Reactions   Aspirin     Other reaction(s): Unknown   Codeine    Fentanyl Other (See Comments)    hypoxia   Oxycodone Hcl  Penicillins    Chlorhexidine Gluconate Rash    Past Medical History:  Diagnosis Date   Atrial fibrillation ([place])    Chronic disease anemia 03/29/2011   Diabetes mellitus    Dyslipidemia    Hypertension    Obesities, morbid ([place])    Osteoarthritis     Past Surgical History:  Procedure Laterality Date   CHOLECYSTECTOMY     REPLACEMENT TOTAL KNEE BILATERAL     SHOULDER SURGERY     RIGHT SHOULDER   TUBAL LIGATION      Family History  Problem Relation Age of Onset   Heart attack Mother    Stroke Mother    Diabetes Mother    Hypertension Brother    Breast cancer Neg Hx     Social History:  reports that she has never smoked. She has never used smokeless tobacco. She reports that she does not drink alcohol and does not use drugs.  Review of Systems:  History of atrial fibrillation: She is on Eliquis  and followed by cardiologist  Hypertension:  she has required multiple medications for control  She is taking diltiazem, metoprolol And Lotensin 10 Since her hospitalization for CHF she is not on the Catapres patch and HCTZ.  Blood pressure fairly well controlled  Creatinine is looking improved  Microalbumin is normal as of 09/2019  BP Readings from Last 3 Encounters:  05/06/21 110/80  04/12/21 (!) 170/98  03/15/21 134/78   Lab Results  Component Value Date   CREATININE 1.16 04/27/2021   CREATININE 1.24 (H) 04/12/2021   CREATININE 1.22 (H) 04/11/2021     Lipids: Consistently well controlled with Crestor 10 mg and LDL is as follows:   Lab Results  Component Value Date   CHOL 156 12/24/2020   HDL 79.10 12/24/2020    LDLCALC 65 12/24/2020   TRIG 61.0 12/24/2020   CHOLHDL 2 12/24/2020    History of autonomous multinodular goiter No symptoms of palpitations or rapid heart rate  TSH is usually low normal or slightly low  She has normal T3 and free T4 levels consistently   Lab Results  Component Value Date   T3FREE 3.0 04/27/2021   T3FREE 2.8 09/02/2020   T3FREE 3.5 12/23/2019   T3FREE 2.6 05/19/2019      Lab Results  Component Value Date   FREET4 1.04 04/27/2021   FREET4 1.07 09/02/2020   FREET4 1.19 12/23/2019   TSH 0.22 (L) 04/27/2021   TSH 0.19 (L) 09/02/2020   TSH 0.39 12/23/2019       Examination:   BP 110/80 (Cuff Size: Large)    Pulse 83    Ht _0  (1.6 m)    Wt 239 lb (108.4 kg)    SpO2 97%    BMI 42.34 kg/m   Body mass index is 42.34 kg/m.   Mild puffiness of the left lower leg present but no pitting edema   ASSESSMENT/ PLAN:   Diabetes type 2, with morbid obesity Current regimen is 2000 mg metformin and Ozempic 1 mg weekly  See history of present illness for detailed discussion of current diabetes management, blood sugar patterns and problems identified  Her A1c is   Her blood sugars are appearing to be overall higher Etiology of this is not clear but may be partly related to diet or progression of her diabetes She has been off her metformin and generally blood sugars are higher since then despite starting Jardiance Has not had any weight loss either  Currently she needs to  get back on her metformin which should likely help her control Recently her blood sugars at home are averaging 144 and mostly higher postprandially based on her diet  She also agrees to see the dietitian again especially with need to cut back on high sodium foods like cold cuts  Autonomous thyroid function: She again has stable thyroid function with slightly low TSH and will recheck on the next visit  HYPERTENSION:  Blood pressure is consistently controlled, has low normal systolic  but no orthostatic dizziness Renal function stable with starting Jardiance However currently not taking clonidine patch which she was on before    Elayne Snare 05/06/2021, 11:10 AM   Note: This office note was prepared with Dragon voice recognition system technology. Any transcriptional errors that result from this process are unintentional.

## 2021-05-09 ENCOUNTER — Other Ambulatory Visit: Payer: Self-pay | Admitting: Endocrinology

## 2021-07-04 ENCOUNTER — Inpatient Hospital Stay: Payer: Medicare Other | Attending: Nurse Practitioner

## 2021-07-07 LAB — HM DIABETES EYE EXAM

## 2021-07-13 ENCOUNTER — Other Ambulatory Visit: Payer: Self-pay

## 2021-07-13 ENCOUNTER — Other Ambulatory Visit (INDEPENDENT_AMBULATORY_CARE_PROVIDER_SITE_OTHER): Payer: Medicare Other

## 2021-07-13 DIAGNOSIS — E1165 Type 2 diabetes mellitus with hyperglycemia: Secondary | ICD-10-CM

## 2021-07-13 DIAGNOSIS — E7800 Pure hypercholesterolemia, unspecified: Secondary | ICD-10-CM

## 2021-07-13 LAB — BASIC METABOLIC PANEL
BUN: 34 mg/dL — ABNORMAL HIGH (ref 6–23)
CO2: 25 mEq/L (ref 19–32)
Calcium: 9.9 mg/dL (ref 8.4–10.5)
Chloride: 107 mEq/L (ref 96–112)
Creatinine, Ser: 1.22 mg/dL — ABNORMAL HIGH (ref 0.40–1.20)
GFR: 45.01 mL/min — ABNORMAL LOW (ref >60.00)
Glucose, Bld: 121 mg/dL — ABNORMAL HIGH (ref 70–99)
Potassium: 4.1 mEq/L (ref 3.5–5.1)
Sodium: 143 mEq/L (ref 135–145)

## 2021-07-13 LAB — LIPID PANEL
Cholesterol: 183 mg/dL (ref 0–200)
HDL: 91.9 mg/dL (ref >39.00)
LDL Cholesterol: 79 mg/dL (ref 0–99)
NonHDL: 91.54
Total CHOL/HDL Ratio: 2
Triglycerides: 61 mg/dL (ref 0.0–149.0)
VLDL: 12.2 mg/dL (ref 0.0–40.0)

## 2021-07-13 LAB — HEMOGLOBIN A1C: Hgb A1c MFr Bld: 7.2 % — ABNORMAL HIGH (ref 4.6–6.5)

## 2021-07-15 ENCOUNTER — Other Ambulatory Visit: Payer: Self-pay

## 2021-07-15 ENCOUNTER — Encounter: Payer: Self-pay | Admitting: Endocrinology

## 2021-07-15 ENCOUNTER — Ambulatory Visit (INDEPENDENT_AMBULATORY_CARE_PROVIDER_SITE_OTHER): Payer: Medicare Other | Admitting: Endocrinology

## 2021-07-15 VITALS — BP 122/74 | HR 78 | Ht 63.0 in | Wt 244.8 lb

## 2021-07-15 DIAGNOSIS — I10 Essential (primary) hypertension: Secondary | ICD-10-CM

## 2021-07-15 DIAGNOSIS — E1165 Type 2 diabetes mellitus with hyperglycemia: Secondary | ICD-10-CM

## 2021-07-15 MED ORDER — OZEMPIC (2 MG/DOSE) 8 MG/3ML ~~LOC~~ SOPN: PEN_INJECTOR | SUBCUTANEOUS | 1 refills | Status: DC

## 2021-07-15 NOTE — Patient Instructions (Signed)
Take 2 shots of 1mg  at end of current Rx ?

## 2021-07-15 NOTE — Progress Notes (Signed)
Patient ID: Megan Fischer, female   DOB: 04/14/1951, 71 y.o.   MRN: [id number]    Reason for Appointment: Follow-up  History of Present Illness   Diagnosis: Type 2 DIABETES MELITUS, date of diagnosis:  2000     Previous history: She has been on various regimens of treatment in the past including Byetta and generally has done better with GLP-1 drugs She continues to take low-dose glimepiride which helps with overall control. Usually blood sugars are not variable Because of occasional hypoglycemia her Amaryl was stopped in 03/2014 Was on Victoza since 2013 until 2019  Recent history: Non-insulin hypoglycemic drugs: Jardiance 10 mg daily, metformin ER 500 mg, 4 tablets at dinner (currently not taking), Ozempic 1.0 mg weekly, on Tuesdays   Current blood sugar patterns and problems identified:  Her A1c is somewhat better at 7.2 compared to 7.5 in December  She was told to restart her METFORMIN on her last visit because of this being stopped in the hospital prior to her visit She has no side effects with this Also has no side effects from [place] or Ozempic She still has difficulty losing weight although she thinks she may be retaining fluid At times she may be still needing relatively higher fat meals but she is trying to do better and avoid fried food also Only occasionally will have a blood sugar over 180, sometimes with eating more fruit However most of her blood sugars after dinner are excellent and averaging only about 140 Not able to do much exercise as usual  Dinner 5-6 pm     Side effects from medications: None  Monitors blood glucose: once or twice a day.    Glucometer:  One Touch   Blood sugar data with averages from monitor download and review of printout:   PRE-MEAL Fasting Lunch Dinner Bedtime Overall  Glucose range:     87-246  Mean/median: 125    123   POST-MEAL PC Breakfast PC Lunch PC Dinner  Glucose range: Up to 246    Mean/median:  119 145    Previously:  PRE-MEAL Mornings Lunch Dinner Bedtime Overall  Glucose range: 111-153    96-245  Mean/median: 134/126    144   POST-MEAL PC Breakfast PC Lunch PC Dinner  Glucose range: 227 96-245 182  Mean/median:  139      Wt Readings from Last 3 Encounters:  07/15/21 244 lb 12.8 oz (111 kg)  05/06/21 239 lb (108.4 kg)  04/12/21 236 lb 1.8 oz (107.1 kg)   Lab Results  Component Value Date   HGBA1C 7.2 (H) 07/13/2021   HGBA1C 7.5 (H) 04/27/2021   HGBA1C 7.2 (H) 04/11/2021   Lab Results  Component Value Date   MICROALBUR 4.7 (H) 12/24/2020   LDLCALC 79 07/13/2021   CREATININE 1.22 (H) 07/13/2021   Other problems discussed today: See review of systems    Lab on 07/13/2021  Component Date Value Ref Range Status   Cholesterol 07/13/2021 183  0 - 200 mg/dL Final   ATP III Classification       Desirable:  < 200 mg/dL               Borderline High:  200 - 239 mg/dL          High:  > = 240 mg/dL   Triglycerides 07/13/2021 61.0  0.0 - 149.0 mg/dL Final   Normal:  <150 mg/dLBorderline High:  150 - 199 mg/dL   HDL 07/13/2021 91.90  >39.00 mg/dL  Final   VLDL 07/13/2021 12.2  0.0 - 40.0 mg/dL Final   LDL Cholesterol 07/13/2021 79  0 - 99 mg/dL Final   Total CHOL/HDL Ratio 07/13/2021 2   Final                  Men          Women1/2 Average Risk     3.4          3.3Average Risk          5.0          4.42X Average Risk          9.6          7.13X Average Risk          15.0          11.0                       NonHDL 07/13/2021 91.54   Final   NOTE:  Non-HDL goal should be 30 mg/dL higher than patient's LDL goal (i.e. LDL goal of < 70 mg/dL, would have non-HDL goal of < 100 mg/dL)   Sodium 07/13/2021 143  135 - 145 mEq/L Final   Potassium 07/13/2021 4.1  3.5 - 5.1 mEq/L Final   Chloride 07/13/2021 107  96 - 112 mEq/L Final   CO2 07/13/2021 25  19 - 32 mEq/L Final   Glucose, Bld 07/13/2021 121 (H)  70 - 99 mg/dL Final   BUN 07/13/2021 34 (H)  6 - 23 mg/dL Final   Creatinine, Ser  07/13/2021 1.22 (H)  0.40 - 1.20 mg/dL Final   GFR 07/13/2021 45.01 (L)  >60.00 mL/min Final   Calculated using the CKD-EPI Creatinine Equation (2021)   Calcium 07/13/2021 9.9  8.4 - 10.5 mg/dL Final   Hgb A1c MFr Bld 07/13/2021 7.2 (H)  4.6 - 6.5 % Final   Glycemic Control Guidelines for People with Diabetes:Non Diabetic:  <6%Goal of Therapy: <7%Additional Action Suggested:  >8%     Allergies as of 07/15/2021       Reactions   Aspirin    Other reaction(s): Unknown   Codeine    Fentanyl Other (See Comments)   hypoxia   Oxycodone Hcl    Penicillins    Chlorhexidine Gluconate Rash        Medication List        Accurate as of July 15, 2021  9:12 AM. If you have any questions, ask your nurse or doctor.          benazepril 10 MG tablet Commonly known as: LOTENSIN Take 1 tablet (10 mg total) by mouth daily.   CALCIUM PO Take by mouth 2 (two) times daily.   diltiazem 360 MG 24 hr capsule Commonly known as: CARDIZEM CD TAKE 1 CAPSULE BY MOUTH  DAILY What changed: how to take this   Eliquis 5 MG Tabs tablet Generic drug: apixaban TAKE 1 TABLET BY MOUTH  TWICE DAILY   ergocalciferol 1.25 MG (50000 UT) capsule Commonly known as: VITAMIN D2 Take 50,000 Units by mouth once a week.   ferrous sulfate 325 (65 FE) MG EC tablet Take 1 tablet (325 mg total) by mouth 2 (two) times daily. What changed: when to take this   furosemide 40 MG tablet Commonly known as: Lasix Take 1 tablet (40 mg total) by mouth daily.   gabapentin 300 MG capsule Commonly known as: NEURONTIN Take 600 mg by mouth 3 (  three) times daily.   Jardiance 10 MG Tabs tablet Generic drug: empagliflozin Take 10 mg by mouth daily.   metoprolol succinate 50 MG 24 hr tablet Commonly known as: TOPROL-XL Take 1 tablet (50 mg total) by mouth daily. Take with or immediately following a meal.   OneTouch Verio IQ System w/Device Kit Use to check blood sugar 2 times per day dx code 250.02   OneTouch Verio  test strip Generic drug: glucose blood USE AS DIRECTED TO CHECK  BLOOD SUGAR 2 TIMES DAILY   Ozempic (1 MG/DOSE) 4 MG/3ML Sopn Generic drug: Semaglutide (1 MG/DOSE) INJECT SUBCUTANEOUSLY 1 MG  ONCE WEEKLY   rosuvastatin 10 MG tablet Commonly known as: CRESTOR TAKE 1 TABLET BY MOUTH  DAILY        Allergies:  Allergies  Allergen Reactions   Aspirin     Other reaction(s): Unknown   Codeine    Fentanyl Other (See Comments)    hypoxia   Oxycodone Hcl    Penicillins    Chlorhexidine Gluconate Rash    Past Medical History:  Diagnosis Date   Atrial fibrillation ([facility])    Chronic disease anemia 03/29/2011   Diabetes mellitus    Dyslipidemia    Hypertension    Obesities, morbid ([facility])    Osteoarthritis     Past Surgical History:  Procedure Laterality Date   CHOLECYSTECTOMY     REPLACEMENT TOTAL KNEE BILATERAL     SHOULDER SURGERY     RIGHT SHOULDER   TUBAL LIGATION      Family History  Problem Relation Age of Onset   Heart attack Mother    Stroke Mother    Diabetes Mother    Hypertension Brother    Breast cancer Neg Hx     Social History:  reports that she has never smoked. She has never used smokeless tobacco. She reports that she does not drink alcohol and does not use drugs.  Review of Systems:  History of atrial fibrillation: She is on Eliquis  and followed by cardiologist  Hypertension:  she has required multiple medications for control  She is taking diltiazem, metoprolol And Lotensin 10 Previously also on Catapres patch and HCTZ.  Blood pressure fairly well controlled  Mild CKD: Creatinine is somewhat variable  Microalbumin is normal as of 8/22  BP Readings from Last 3 Encounters:  07/15/21 122/74  05/06/21 110/80  04/12/21 (!) 170/98   Lab Results  Component Value Date   CREATININE 1.22 (H) 07/13/2021   CREATININE 1.16 04/27/2021   CREATININE 1.24 (H) 04/12/2021     Lipids: Consistently well controlled with Crestor 10 mg and LDL is  as follows:   Lab Results  Component Value Date   CHOL 183 07/13/2021   HDL 91.90 07/13/2021   LDLCALC 79 07/13/2021   TRIG 61.0 07/13/2021   CHOLHDL 2 07/13/2021    History of autonomous multinodular goiter No symptoms of palpitations or rapid heart rate  TSH is usually low normal or slightly low  She has normal T3 and free T4 levels consistently   Lab Results  Component Value Date   T3FREE 3.0 04/27/2021   T3FREE 2.8 09/02/2020   T3FREE 3.5 12/23/2019   T3FREE 2.6 05/19/2019      Lab Results  Component Value Date   FREET4 1.04 04/27/2021   FREET4 1.07 09/02/2020   FREET4 1.19 12/23/2019   TSH 0.22 (L) 04/27/2021   TSH 0.19 (L) 09/02/2020   TSH 0.39 12/23/2019  Examination:   BP 122/74    Pulse 78    Ht 5' 3"  (1.6 m)    Wt 244 lb 12.8 oz (111 kg)    SpO2 98%    BMI 43.36 kg/m   Body mass index is 43.36 kg/m.     ASSESSMENT/ PLAN:   Diabetes type 2, with morbid obesity Current regimen is 2000 mg metformin and Ozempic 1 mg weekly  See history of present illness for detailed discussion of current diabetes management, blood sugar patterns and problems identified  Her A1c is 7.2  Her blood sugars are improving with metformin but not consistently Also can benefit from more weight loss which she is not able to achieve even with 1 mg Ozempic She agrees to try 2 mg Ozempic from her next prescription, on the last 2 doses of the 1 mg pen she will take 2 shots together   She may also benefit from consultation with nutritionist  Mild CKD: This is relatively stable Likely not related to diabetes We will continue Jardiance at this will help her CKD long-term  LIPIDS: Very well controlled with LDL 79 She will continue Crestor 10 mg daily  HYPERTENSION:  Blood pressure is very well controlled with current regimen and will continue   Elayne Snare 07/15/2021, 9:12 AM   Note: This office note was prepared with Dragon voice recognition system technology.  Any transcriptional errors that result from this process are unintentional.

## 2021-08-08 ENCOUNTER — Encounter: Payer: Self-pay | Admitting: Cardiovascular Disease

## 2021-08-08 ENCOUNTER — Ambulatory Visit (INDEPENDENT_AMBULATORY_CARE_PROVIDER_SITE_OTHER): Payer: Medicare Other | Admitting: Cardiovascular Disease

## 2021-08-08 VITALS — BP 136/76 | HR 56 | Ht 63.0 in | Wt 247.2 lb

## 2021-08-08 DIAGNOSIS — R0609 Other forms of dyspnea: Secondary | ICD-10-CM | POA: Diagnosis not present

## 2021-08-08 DIAGNOSIS — I890 Lymphedema, not elsewhere classified: Secondary | ICD-10-CM | POA: Diagnosis not present

## 2021-08-08 DIAGNOSIS — I5033 Acute on chronic diastolic (congestive) heart failure: Secondary | ICD-10-CM | POA: Diagnosis not present

## 2021-08-08 DIAGNOSIS — I4891 Unspecified atrial fibrillation: Secondary | ICD-10-CM | POA: Diagnosis not present

## 2021-08-08 NOTE — Progress Notes (Signed)
Megan Fischer ?Date of Birth  08-May-1951 ?[facility] / [place] ?[address] ?[place], [place]  [address] ?[phone]  ? ?Problem List ?1. HTN ?2. Anemia - iron deficiency ?3. Diabetes Mellitus ?4.   Atrial Fibrillation - [facility] ?5. S/p knee replacements bilaterally ?6. 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 ? ? ?Megan Fischer is a middle-aged female with a history of obesity, intermittent atrial fibrillation, hypertension, dyslipidemia, and diabetes. She presents today for followup visit. She does not have any complaints.  She has lost around 65 lbs since last year. ? ?January 29, 2018: ? ?Megan Fischer is seen today for follow up of her atrial fibrillation.  Her atrial  Fib  is now more persistent.  She is completely asymptomatic and cannot tell when she is in atrial fibrillation or not. ?Has rare episodes of chest soreness.  She has occasionally woken up with chest pain.  The pains typically get worse if she moves around through the day. ?Is exercising some.   Chest pains are not worsened with exercise.  There is a pleuritic component to these episodes of chest pain. ?The pains are definitely   worsened when she twists and turns her torso. ?No dyspnea  ? ?Sees her primary MD regulalry  ?Does not eat much salt .  ? ?July 29, 2019: ?Megan Fischer is seen for follow up of her congestive heart failure, atrial fib ,  and hypertension.  She also has diabetes mellitus.    Has not gotten her covid vaccines yet.  Going today  ?No cp or dyspnea. ?Not much exercise . ?Planning to exercise. ?Her husband passed away last month ( parkinsons disease)  ?Advised some weight loss  ? ?July 26, 2020: ?Megan Fischer is seen for follow up of her atrial fib, HTN, diastlic CHF  ?Rare random episodes of CP - might last for a week  ?Advised weight loss ?Unable to exercise,  Back / leg  ?Has not taken her BP meds yet this am ?Has not had her covid vaccines yet  ? ?August 08, 2021: ?Megan Fischer is seen today for follow-up visit.  She has a history of persistent atrial fibrillation, hypertension, diastolic congestive heart failure. ?Hx of obesity  ? ?Has occasional see if we ever done stress test or anything like cp.   Wakes up with the pain  ? ?CP is a sharp pain , might last for hours, ?Seems to worsen with deep breath  ? ?She was admitted with a CHF exacerbation in December, 2022. ?She was started on furosemide at that time.  Her presenting O2 saturations were 86%. ? ?Still eats bologna 1-2 times a week    ? ? ? ?Current Outpatient Medications on File Prior to Visit  ?Medication Sig Dispense Refill  ? apixaban (ELIQUIS) 5 MG TABS tablet TAKE 1 TABLET BY MOUTH  TWICE DAILY 180 tablet 1  ? benazepril (LOTENSIN) 10 MG tablet Take 1 tablet (10 mg total) by mouth daily. 30 tablet 11  ? Blood Glucose Monitoring Suppl (ONETOUCH VERIO IQ SYSTEM) W/DEVICE KIT Use to check blood sugar 2 times per day dx code 250.02 1 kit 0  ? CALCIUM PO Take by mouth 2 (two) times daily.    ? diltiazem (CARDIZEM CD) 360 MG 24 hr capsule TAKE 1 CAPSULE BY MOUTH  DAILY 90 capsule 3  ? ergocalciferol (VITAMIN D2) 50000 UNITS capsule Take 50,000 Units by mouth once a week.    ?  ferrous sulfate 325 (65 FE) MG EC tablet Take 1 tablet (325 mg total) by mouth 2 (two) times daily. (Patient taking differently: Take 325 mg by mouth daily with breakfast. Taking 380m once daily) 60 tablet 3  ? furosemide (LASIX) 40 MG tablet Take 1 tablet (40 mg total) by mouth daily. 30 tablet 2  ? gabapentin (NEURONTIN) 300 MG capsule Take 600 mg by mouth 3 (three) times daily.    ? JARDIANCE 10 MG TABS tablet Take 10 mg by mouth daily.    ? metoprolol succinate (TOPROL-XL) 50 MG 24 hr tablet Take 1 tablet (50 mg total) by mouth daily. Take with or immediately following a meal. 30 tablet 2  ? ONETOUCH VERIO test strip USE AS DIRECTED TO CHECK  BLOOD SUGAR 2 TIMES DAILY 200 strip 3  ? rosuvastatin (CRESTOR) 10 MG tablet TAKE 1 TABLET BY MOUTH   DAILY 90 tablet 1  ? Semaglutide, 2 MG/DOSE, (OZEMPIC, 2 MG/DOSE,) 8 MG/3ML SOPN Inject 2 mg weekly 9 mL 1  ? ?No current facility-administered medications on file prior to visit.  ? ? ?Allergies  ?Allergen Reactions  ? Aspirin   ?  Other reaction(s): Unknown  ? Codeine   ? Fentanyl Other (See Comments)  ?  hypoxia  ? Oxycodone Hcl   ? Penicillins   ? Chlorhexidine Gluconate Rash  ? ? ?Past Medical History:  ?Diagnosis Date  ? [place]   ? Chronic disease anemia 03/29/2011  ? Diabetes mellitus   ? Dyslipidemia   ? Hypertension   ? Obesities, morbid ([place]   ? Osteoarthritis   ? ? ?Past Surgical History:  ?Procedure Laterality Date  ? CHOLECYSTECTOMY    ? REPLACEMENT TOTAL KNEE BILATERAL    ? SHOULDER SURGERY    ? RIGHT SHOULDER  ? TUBAL LIGATION    ? ? ?Social History  ? ?Tobacco Use  ?Smoking Status Never  ?Smokeless Tobacco Never  ? ? ?Social History  ? ?Substance and Sexual Activity  ?Alcohol Use No  ? ? ?Family History  ?Problem Relation Age of Onset  ? Heart attack Mother   ? Stroke Mother   ? Diabetes Mother   ? Hypertension Brother   ? Breast cancer Neg Hx   ? ? ?Reviw of Systems:  ?Reviewed in the HPI.  All other systems are negative. ? ? ?Physical Exam: ?Blood pressure 136/76, pulse (!) 56, height 5' 3"  (1.6 m), weight 247 lb 3.2 oz (112.1 kg), SpO2 99 %. ? ?GEN: elderly female,  morbidly obese,  ?Unable to get up on exam tabke  ? in no acute distress ?HEENT: Normal ?NECK: No JVD; No carotid bruits ?LYMPHATICS: No lymphadenopathy ?CARDIAC: irreg. Irreg.  ?RESPIRATORY:  Clear to auscultation without rales, wheezing or rhonchi  ?ABDOMEN: Soft, non-tender, non-distended ?MUSCULOSKELETAL: 1-2 + leg edema.   L leg is larger than R leg ?( Had a giant cell tumor removed,  has had multiple surgeries on her left leg )  ?SKIN: Warm and dry ?NEUROLOGIC:  Alert and oriented x 3 ? ? ? ?ECG:     ? ? ?Assessment / Plan:  ? ?1.  Atrial fib - on eliquis .    ? ?2.   :  Hypertension:    BP is well controlled.   ? ?3.  Hyperlipidemia :  last lipids look good  - LDL is 79  ? ?  ?4.  Anemia:   [facility]  ? ?5.  Marked L leg edema:  had a negative lower extremity venous duplex on Nov. 2022 ? ?Mertie Moores, MD  ?08/08/2021 9:42 AM    ?[facility] [address],  [address] ?[place], [place]  [address] ?Phone: [phone]; Fax: [phone]  ? ? ?

## 2021-08-08 NOTE — Patient Instructions (Signed)
Medication Instructions:  ?Your physician recommends that you continue on your current medications as directed. Please refer to the Current Medication list given to you today. ? ?*If you need a refill on your cardiac medications before your next appointment, please call your pharmacy* ? ?Lab Work: ?NONE ?If you have labs (blood work) drawn today and your tests are completely normal, you will receive your results only by: ?MyChart Message (if you have MyChart) OR ?A paper copy in the mail ?If you have any lab test that is abnormal or we need to change your treatment, we will call you to review the results. ? ?Testing/Procedures: ?Your physician has requested that you have an echocardiogram. Echocardiography is a painless test that uses sound waves to create images of your heart. It provides your doctor with information about the size and shape of your heart and how well your heart?s chambers and valves are working. This procedure takes approximately one hour. There are no restrictions for this procedure. ? ?Your physician has requested that you have a lexiscan myoview. For further information please visit [email or web address] Please follow instruction sheet, as given.  ? ?Follow-Up: ?At [facility], you and your health needs are our priority.  As part of our continuing mission to provide you with exceptional heart care, we have created designated Provider Care Teams.  These Care Teams include your primary Cardiologist (physician) and Advanced Practice Providers (APPs -  Physician Assistants and Nurse Practitioners) who all work together to provide you with the care you need, when you need it. ? ?We recommend signing up for the patient portal called "MyChart".  Sign up information is provided on this After Visit Summary.  MyChart is used to connect with patients for Virtual Visits (Telemedicine).  Patients are able to view lab/test results, encounter notes, upcoming appointments, etc.  Non-urgent messages can be  sent to your provider as well.   ?To learn more about what you can do with MyChart, go to [email or web address].   ? ?Your next appointment:   ?1 year(s) ? ?The format for your next appointment:   ?In Person ? ?Provider:   ?Kristeen Miss, MD  or Chelsea Aus, PA-C or Tereso Newcomer, [place] ?

## 2021-08-11 ENCOUNTER — Ambulatory Visit: Payer: Medicare Other | Admitting: Nurse Practitioner

## 2021-08-11 ENCOUNTER — Other Ambulatory Visit: Payer: Medicare Other

## 2021-08-17 ENCOUNTER — Telehealth (HOSPITAL_COMMUNITY): Payer: Self-pay | Admitting: *Deleted

## 2021-08-17 NOTE — Telephone Encounter (Signed)
Patient given detailed instructions per Myocardial Perfusion Study Information Sheet for the test on 08/25/21 at 8:15. Patient notified to arrive 15 minutes early and that it is imperative to arrive on time for appointment to keep from having the test rescheduled. ? If you need to cancel or reschedule your appointment, please call the office within 24 hours of your appointment. . Patient verbalized understanding.Nelson Chimes S ? ? ?

## 2021-08-22 ENCOUNTER — Encounter: Payer: Self-pay | Admitting: Endocrinology

## 2021-08-25 ENCOUNTER — Ambulatory Visit (HOSPITAL_BASED_OUTPATIENT_CLINIC_OR_DEPARTMENT_OTHER): Payer: Medicare Other

## 2021-08-25 ENCOUNTER — Ambulatory Visit (HOSPITAL_COMMUNITY): Payer: Medicare Other | Attending: Cardiology

## 2021-08-25 DIAGNOSIS — R0609 Other forms of dyspnea: Secondary | ICD-10-CM

## 2021-08-25 LAB — ECHOCARDIOGRAM COMPLETE
Height: 63 in
S' Lateral: 2.3 cm
Weight: 3952 oz

## 2021-08-25 MED ORDER — TECHNETIUM TC 99M TETROFOSMIN IV KIT
32.9000 | PACK | Freq: Once | INTRAVENOUS | Status: AC | PRN
  Administered 2021-08-25: 32.9 via INTRAVENOUS
  Filled 2021-08-25: qty 33

## 2021-08-25 MED ORDER — REGADENOSON 0.4 MG/5ML IV SOLN
0.4000 mg | Freq: Once | INTRAVENOUS | Status: AC
  Administered 2021-08-25: 0.4 mg via INTRAVENOUS

## 2021-08-26 ENCOUNTER — Ambulatory Visit (HOSPITAL_COMMUNITY): Payer: Medicare Other | Attending: Internal Medicine

## 2021-08-26 LAB — MYOCARDIAL PERFUSION IMAGING
LV dias vol: 82 mL (ref 46–106)
LV sys vol: 28 mL
Nuc Stress EF: 66 %
Peak HR: 101 {beats}/min
Rest HR: 79 {beats}/min
Rest Nuclear Isotope Dose: 29.9 mCi
SDS: 0
SRS: 0
SSS: 0
ST Depression (mm): 0 mm
Stress Nuclear Isotope Dose: 32.9 mCi
TID: 0.89

## 2021-08-26 MED ORDER — TECHNETIUM TC 99M TETROFOSMIN IV KIT
29.9000 | PACK | Freq: Once | INTRAVENOUS | Status: AC | PRN
  Administered 2021-08-26: 29.9 via INTRAVENOUS
  Filled 2021-08-26: qty 30

## 2021-08-29 ENCOUNTER — Other Ambulatory Visit: Payer: Self-pay

## 2021-08-29 ENCOUNTER — Telehealth: Payer: Self-pay | Admitting: Cardiovascular Disease

## 2021-08-29 DIAGNOSIS — D638 Anemia in other chronic diseases classified elsewhere: Secondary | ICD-10-CM

## 2021-08-29 NOTE — Telephone Encounter (Signed)
See result encounter

## 2021-08-29 NOTE — Telephone Encounter (Signed)
Pt returning call regarding ECHO results. Please advise 

## 2021-08-30 NOTE — Progress Notes (Deleted)
[facility]   Telephone:(336) [phone] Fax:(336) [phone]   Clinic Follow up Note   Patient Care Team: Dulce Sellar, MD as PCP - General (General Practice) Nahser, Wonda Cheng, MD as PCP - Cardiology (Cardiology) 08/30/2021  CHIEF COMPLAINT: Follow up anemia of chronic disease   CURRENT THERAPY: Aranesp as needed  INTERVAL HISTORY: [name] returns for follow-up as scheduled, last seen by me 03/02/2021.  Last hemoglobin in epic on 04/12/2021 was 9.6.   REVIEW OF SYSTEMS:   Constitutional: Denies fevers, chills or abnormal weight loss Eyes: Denies blurriness of vision Ears, nose, mouth, throat, and face: Denies mucositis or sore throat Respiratory: Denies cough, dyspnea or wheezes Cardiovascular: Denies palpitation, chest discomfort or lower extremity swelling Gastrointestinal:  Denies nausea, heartburn or change in bowel habits Skin: Denies abnormal skin rashes Lymphatics: Denies new lymphadenopathy or easy bruising Neurological:Denies numbness, tingling or new weaknesses Behavioral/Psych: Mood is stable, no new changes  All other systems were reviewed with the patient and are negative.  MEDICAL HISTORY:  Past Medical History:  Diagnosis Date   Atrial fibrillation ([place])    Chronic disease anemia 03/29/2011   Diabetes mellitus    Dyslipidemia    Hypertension    Obesities, morbid ([place])    Osteoarthritis     SURGICAL HISTORY: Past Surgical History:  Procedure Laterality Date   CHOLECYSTECTOMY     REPLACEMENT TOTAL KNEE BILATERAL     SHOULDER SURGERY     RIGHT SHOULDER   TUBAL LIGATION      I have reviewed the social history and family history with the patient and they are unchanged from previous note.  ALLERGIES:  is allergic to aspirin, codeine, fentanyl, oxycodone hcl, penicillins, and chlorhexidine gluconate.  MEDICATIONS:  Current Outpatient Medications  Medication Sig Dispense Refill   apixaban (ELIQUIS) 5 MG TABS tablet TAKE 1 TABLET BY  MOUTH  TWICE DAILY 180 tablet 1   benazepril (LOTENSIN) 10 MG tablet Take 1 tablet (10 mg total) by mouth daily. 30 tablet 11   Blood Glucose Monitoring Suppl (ONETOUCH VERIO IQ SYSTEM) W/DEVICE KIT Use to check blood sugar 2 times per day dx code 250.02 1 kit 0   CALCIUM PO Take by mouth 2 (two) times daily.     diltiazem (CARDIZEM CD) 360 MG 24 hr capsule TAKE 1 CAPSULE BY MOUTH  DAILY 90 capsule 3   ergocalciferol (VITAMIN D2) 50000 UNITS capsule Take 50,000 Units by mouth once a week.     ferrous sulfate 325 (65 FE) MG EC tablet Take 1 tablet (325 mg total) by mouth 2 (two) times daily. (Patient taking differently: Take 325 mg by mouth daily with breakfast. Taking 311m once daily) 60 tablet 3   furosemide (LASIX) 40 MG tablet Take 1 tablet (40 mg total) by mouth daily. 30 tablet 2   gabapentin (NEURONTIN) 300 MG capsule Take 600 mg by mouth 3 (three) times daily.     JARDIANCE 10 MG TABS tablet Take 10 mg by mouth daily.     metoprolol succinate (TOPROL-XL) 50 MG 24 hr tablet Take 1 tablet (50 mg total) by mouth daily. Take with or immediately following a meal. 30 tablet 2   ONETOUCH VERIO test strip USE AS DIRECTED TO CHECK  BLOOD SUGAR 2 TIMES DAILY 200 strip 3   rosuvastatin (CRESTOR) 10 MG tablet TAKE 1 TABLET BY MOUTH  DAILY 90 tablet 1   Semaglutide, 2 MG/DOSE, (OZEMPIC, 2 MG/DOSE,) 8 MG/3ML SOPN Inject 2 mg weekly 9 mL  1   No current facility-administered medications for this visit.    PHYSICAL EXAMINATION: ECOG PERFORMANCE STATUS: {CHL ONC ECOG PS:[phone]}  There were no vitals filed for this visit. There were no vitals filed for this visit.  GENERAL:alert, no distress and comfortable SKIN: skin color, texture, turgor are normal, no rashes or significant lesions EYES: normal, Conjunctiva are pink and non-injected, sclera clear OROPHARYNX:no exudate, no erythema and lips, buccal mucosa, and tongue normal  NECK: supple, thyroid normal size, non-tender, without  nodularity LYMPH:  no palpable lymphadenopathy in the cervical, axillary or inguinal LUNGS: clear to auscultation and percussion with normal breathing effort HEART: regular rate & rhythm and no murmurs and no lower extremity edema ABDOMEN:abdomen soft, non-tender and normal bowel sounds Musculoskeletal:no cyanosis of digits and no clubbing  NEURO: alert & oriented x 3 with fluent speech, no focal motor/sensory deficits  LABORATORY DATA:  I have reviewed the data as listed    Latest Ref Rng & Units 04/12/2021    4:48 AM 04/11/2021    5:42 AM 04/10/2021   10:59 PM  CBC  WBC 4.0 - 10.5 K/uL 6.3   6.5   7.7    Hemoglobin 12.0 - 15.0 g/dL 9.6   9.2   9.7    Hematocrit 36.0 - 46.0 % 30.1   29.3   31.2    Platelets 150 - 400 K/uL 240   207   228          Latest Ref Rng & Units 07/13/2021    7:38 AM 04/27/2021    8:09 AM 04/12/2021    4:48 AM  CMP  Glucose 70 - 99 mg/dL 121   123   118    BUN 6 - 23 mg/dL 34   29   34    Creatinine 0.40 - 1.20 mg/dL 1.22   1.16   1.24    Sodium 135 - 145 mEq/L 143   143   140    Potassium 3.5 - 5.1 mEq/L 4.1   3.8   3.9    Chloride 96 - 112 mEq/L 107   105   101    CO2 19 - 32 mEq/L 25   29   30     Calcium 8.4 - 10.5 mg/dL 9.9   10.8   9.0        RADIOGRAPHIC STUDIES: I have personally reviewed the radiological images as listed and agreed with the findings in the report. No results found.   ASSESSMENT & PLAN:  No problem-specific Assessment & Plan notes found for this encounter.   No orders of the defined types were placed in this encounter.  All questions were answered. The patient knows to call the clinic with any problems, questions or concerns. No barriers to learning was detected. I spent {CHL ONC TIME VISIT - SPQZR:[id number]} counseling the patient face to face. The total time spent in the appointment was {CHL ONC TIME VISIT - LKTGY:[id number]} and more than 50% was on counseling and review of test results     Alla Feeling,  NP 08/30/21

## 2021-08-31 ENCOUNTER — Inpatient Hospital Stay: Payer: Medicare Other | Admitting: Nurse Practitioner

## 2021-08-31 ENCOUNTER — Inpatient Hospital Stay: Payer: Medicare Other | Attending: Nurse Practitioner

## 2021-10-14 ENCOUNTER — Other Ambulatory Visit (INDEPENDENT_AMBULATORY_CARE_PROVIDER_SITE_OTHER): Payer: Medicare Other

## 2021-10-14 DIAGNOSIS — E1165 Type 2 diabetes mellitus with hyperglycemia: Secondary | ICD-10-CM

## 2021-10-14 LAB — BASIC METABOLIC PANEL
BUN: 39 mg/dL — ABNORMAL HIGH (ref 6–23)
CO2: 28 mEq/L (ref 19–32)
Calcium: 10.3 mg/dL (ref 8.4–10.5)
Chloride: 102 mEq/L (ref 96–112)
Creatinine, Ser: 1.32 mg/dL — ABNORMAL HIGH (ref 0.40–1.20)
GFR: 40.88 mL/min — ABNORMAL LOW (ref >60.00)
Glucose, Bld: 126 mg/dL — ABNORMAL HIGH (ref 70–99)
Potassium: 3.9 mEq/L (ref 3.5–5.1)
Sodium: 139 mEq/L (ref 135–145)

## 2021-10-14 LAB — HEMOGLOBIN A1C: Hgb A1c MFr Bld: 7.6 % — ABNORMAL HIGH (ref 4.6–6.5)

## 2021-10-18 ENCOUNTER — Telehealth: Payer: Self-pay | Admitting: Nurse Practitioner

## 2021-10-18 NOTE — Telephone Encounter (Signed)
.  Called patient to schedule appointment per 4/19 inbasket, patient is aware of date and time.   ?

## 2021-10-19 ENCOUNTER — Encounter: Payer: Self-pay | Admitting: Endocrinology

## 2021-10-19 ENCOUNTER — Ambulatory Visit (INDEPENDENT_AMBULATORY_CARE_PROVIDER_SITE_OTHER): Payer: Medicare Other | Admitting: Endocrinology

## 2021-10-19 VITALS — BP 124/70 | HR 86 | Ht 63.0 in | Wt 235.8 lb

## 2021-10-19 DIAGNOSIS — I10 Essential (primary) hypertension: Secondary | ICD-10-CM | POA: Diagnosis not present

## 2021-10-19 DIAGNOSIS — E1165 Type 2 diabetes mellitus with hyperglycemia: Secondary | ICD-10-CM

## 2021-10-19 NOTE — Progress Notes (Signed)
Patient ID: Megan Fischer, female   DOB: August 14, 1950, 71 y.o.   MRN: [id number]    Reason for Appointment: Follow-up  History of Present Illness   Diagnosis: Type 2 DIABETES MELITUS, date of diagnosis:  2000     Previous history: She has been on various regimens of treatment in the past including Byetta and generally has done better with GLP-1 drugs She continues to take low-dose glimepiride which helps with overall control. Usually blood sugars are not variable Because of occasional hypoglycemia her Amaryl was stopped in 03/2014 Was on Victoza since 2013 until 2019  Recent history: Non-insulin hypoglycemic drugs: Jardiance 10 mg daily, metformin ER 500 mg, 4 tablets at dinner  Ozempic 2 mg weekly, on Tuesdays   Current blood sugar patterns and problems identified:  Her A1c is back up to 7.6 from 7.2  She was not able to check her blood sugars since her meter stopped functioning about a month ago and she did not call Although she thinks her blood sugars were high in the morning at times lab glucose is only 126 Not clear when her blood sugars have been higher to raise her A1c  However she does admit that she has been not cooking much and eating on balanced meals with more carbohydrates and not always restricting portions, eating more sandwiches Usually avoiding fast food Also her Ozempic was increased to 2 mg on the last visit and she thinks she has had no nausea with this Otherwise she thinks she has taken all her medications regularly Not able to do much exercise as usual  Dinner at 5-6 pm     Side effects from medications: None  Monitors blood glucose: once or twice a day.    Glucometer:  One Touch   Blood sugar information by recall:  Fasting as high as 187-216 previously  Previous records:  PRE-MEAL Fasting Lunch Dinner Bedtime Overall  Glucose range:     87-246  Mean/median: 125    123   POST-MEAL PC Breakfast PC Lunch PC Dinner  Glucose range: Up to  246    Mean/median:  119 145      Wt Readings from Last 3 Encounters:  10/19/21 235 lb 12.8 oz (107 kg)  08/25/21 247 lb (112 kg)  08/08/21 247 lb 3.2 oz (112.1 kg)   Lab Results  Component Value Date   HGBA1C 7.6 (H) 10/14/2021   HGBA1C 7.2 (H) 07/13/2021   HGBA1C 7.5 (H) 04/27/2021   Lab Results  Component Value Date   MICROALBUR 4.7 (H) 12/24/2020   LDLCALC 79 07/13/2021   CREATININE 1.32 (H) 10/14/2021   Other problems discussed today: See review of systems    Lab on 10/14/2021  Component Date Value Ref Range Status   Sodium 10/14/2021 139  135 - 145 mEq/L Final   Potassium 10/14/2021 3.9  3.5 - 5.1 mEq/L Final   Chloride 10/14/2021 102  96 - 112 mEq/L Final   CO2 10/14/2021 28  19 - 32 mEq/L Final   Glucose, Bld 10/14/2021 126 (H)  70 - 99 mg/dL Final   BUN 10/14/2021 39 (H)  6 - 23 mg/dL Final   Creatinine, Ser 10/14/2021 1.32 (H)  0.40 - 1.20 mg/dL Final   GFR 10/14/2021 40.88 (L)  >60.00 mL/min Final   Calculated using the CKD-EPI Creatinine Equation (2021)   Calcium 10/14/2021 10.3  8.4 - 10.5 mg/dL Final   Hgb A1c MFr Bld 10/14/2021 7.6 (H)  4.6 - 6.5 % Final  Glycemic Control Guidelines for People with Diabetes:Non Diabetic:  <6%Goal of Therapy: <7%Additional Action Suggested:  >8%     Allergies as of 10/19/2021       Reactions   Aspirin    Other reaction(s): Unknown   Codeine    Fentanyl Other (See Comments)   hypoxia   Oxycodone Hcl    Penicillins    Chlorhexidine Gluconate Rash        Medication List        Accurate as of October 19, 2021  9:38 AM. If you have any questions, ask your nurse or doctor.          benazepril 10 MG tablet Commonly known as: LOTENSIN Take 1 tablet (10 mg total) by mouth daily.   CALCIUM PO Take by mouth 2 (two) times daily.   diltiazem 360 MG 24 hr capsule Commonly known as: CARDIZEM CD TAKE 1 CAPSULE BY MOUTH  DAILY   Eliquis 5 MG Tabs tablet Generic drug: apixaban TAKE 1 TABLET BY MOUTH  TWICE  DAILY   ergocalciferol 1.25 MG (50000 UT) capsule Commonly known as: VITAMIN D2 Take 50,000 Units by mouth once a week.   ferrous sulfate 325 (65 FE) MG EC tablet Take 1 tablet (325 mg total) by mouth 2 (two) times daily. What changed:  when to take this additional instructions   furosemide 40 MG tablet Commonly known as: Lasix Take 1 tablet (40 mg total) by mouth daily.   gabapentin 300 MG capsule Commonly known as: NEURONTIN Take 600 mg by mouth 3 (three) times daily.   HYDROcodone-acetaminophen 5-325 MG tablet Commonly known as: NORCO/VICODIN Take 0.5-1 tablets by mouth 2 (two) times daily.   Jardiance 10 MG Tabs tablet Generic drug: empagliflozin Take 10 mg by mouth daily.   methocarbamol 500 MG tablet Commonly known as: ROBAXIN Take 500 mg by mouth 2 (two) times daily.   metoprolol succinate 50 MG 24 hr tablet Commonly known as: TOPROL-XL Take 1 tablet (50 mg total) by mouth daily. Take with or immediately following a meal.   OneTouch Verio IQ System w/Device Kit Use to check blood sugar 2 times per day dx code 250.02   OneTouch Verio test strip Generic drug: glucose blood USE AS DIRECTED TO CHECK  BLOOD SUGAR 2 TIMES DAILY   Ozempic (2 MG/DOSE) 8 MG/3ML Sopn Generic drug: Semaglutide (2 MG/DOSE) Inject 2 mg weekly   rosuvastatin 10 MG tablet Commonly known as: CRESTOR TAKE 1 TABLET BY MOUTH  DAILY        Allergies:  Allergies  Allergen Reactions   Aspirin     Other reaction(s): Unknown   Codeine    Fentanyl Other (See Comments)    hypoxia   Oxycodone Hcl    Penicillins    Chlorhexidine Gluconate Rash    Past Medical History:  Diagnosis Date   Atrial fibrillation (HCC)    Chronic disease anemia 03/29/2011   Diabetes mellitus    Dyslipidemia    Hypertension    Obesities, morbid (HCC)    Osteoarthritis     Past Surgical History:  Procedure Laterality Date   CHOLECYSTECTOMY     REPLACEMENT TOTAL KNEE BILATERAL     SHOULDER SURGERY      RIGHT SHOULDER   TUBAL LIGATION      Family History  Problem Relation Age of Onset   Heart attack Mother    Stroke Mother    Diabetes Mother    Hypertension Brother    Breast cancer Neg Hx  Social History:  reports that she has never smoked. She has never used smokeless tobacco. She reports that she does not drink alcohol and does not use drugs.  Review of Systems:  History of atrial fibrillation: She is on Eliquis  and followed by cardiologist  Hypertension:  she has been on multiple medications for control  She is taking diltiazem, metoprolol And Lotensin 10 Previously also on Catapres patch and HCTZ.  Blood pressure well controlled  Mild CKD: Creatinine is somewhat variable as before  Microalbumin is normal as of 8/22  BP Readings from Last 3 Encounters:  10/19/21 124/70  08/08/21 136/76  07/15/21 122/74   Lab Results  Component Value Date   CREATININE 1.32 (H) 10/14/2021   CREATININE 1.22 (H) 07/13/2021   CREATININE 1.16 04/27/2021     Lipids: Consistently well controlled with Crestor 10 mg and LDL is as follows:   Lab Results  Component Value Date   CHOL 183 07/13/2021   HDL 91.90 07/13/2021   LDLCALC 79 07/13/2021   TRIG 61.0 07/13/2021   CHOLHDL 2 07/13/2021    History of autonomous multinodular goiter No symptoms of palpitations or rapid heart rate  TSH is usually low normal or slightly low  She has normal T3 and free T4 levels consistently   Lab Results  Component Value Date   T3FREE 3.0 04/27/2021   T3FREE 2.8 09/02/2020   T3FREE 3.5 12/23/2019   T3FREE 2.6 05/19/2019      Lab Results  Component Value Date   FREET4 1.04 04/27/2021   FREET4 1.07 09/02/2020   FREET4 1.19 12/23/2019   TSH 0.22 (L) 04/27/2021   TSH 0.19 (L) 09/02/2020   TSH 0.39 12/23/2019       Examination:   BP 124/70   Pulse 86   Ht 5' 3"  (1.6 m)   Wt 235 lb 12.8 oz (107 kg)   SpO2 90%   BMI 41.77 kg/m   Body mass index is 41.77 kg/m.      ASSESSMENT/ PLAN:   Diabetes type 2, with severe obesity  Current regimen is 2000 mg metformin, Jardiance 10 mg and Ozempic 2 mg weekly  See history of present illness for detailed discussion of current diabetes management, blood sugar patterns and problems identified  Her A1c is 7.6  Her blood sugars are likely not well controlled because of her being inconsistent with her diet Also not clear if her Vania Rea is working as well with slightly higher creatinine  Without any home blood sugar records difficult to identify any high blood sugar patterns as lab glucose is 126 fasting Has taken her medications consistently  Plan: Increase Jardiance to 25 mg, she can use of her 10 mg tablets and take 2 daily She agrees to a consultation with nutritionist  Mild CKD: This is likely variable stable No history of microalbuminuria With increasing her Jardiance we will need to recheck her creatinine and consider reducing Lasix if creatinine goes up  Follow-up thyroid levels on the next visit  HYPERTENSION:  Blood pressure is well controlled with current regimen and will continue   Elayne Snare 10/19/2021, 9:38 AM   Note: This office note was prepared with Dragon voice recognition system technology. Any transcriptional errors that result from this process are unintentional.

## 2021-10-19 NOTE — Patient Instructions (Addendum)
Jardiance 2 daily  Check blood sugars on waking up 3 days a week  Also check blood sugars about 2 hours after meals and do this after different meals by rotation  Recommended blood sugar levels on waking up are 90-130 and about 2 hours after meal is 130-160  Please bring your blood sugar monitor to each visit, thank you

## 2021-10-31 ENCOUNTER — Inpatient Hospital Stay: Payer: Medicare Other | Attending: Nurse Practitioner

## 2021-10-31 ENCOUNTER — Other Ambulatory Visit: Payer: Self-pay

## 2021-10-31 ENCOUNTER — Inpatient Hospital Stay (HOSPITAL_BASED_OUTPATIENT_CLINIC_OR_DEPARTMENT_OTHER): Payer: Medicare Other | Admitting: Nurse Practitioner

## 2021-10-31 ENCOUNTER — Encounter: Payer: Self-pay | Admitting: Nurse Practitioner

## 2021-10-31 VITALS — BP 138/81 | HR 70 | Temp 97.9°F | Resp 16 | Ht 63.0 in | Wt 243.5 lb

## 2021-10-31 DIAGNOSIS — D638 Anemia in other chronic diseases classified elsewhere: Secondary | ICD-10-CM | POA: Insufficient documentation

## 2021-10-31 DIAGNOSIS — D508 Other iron deficiency anemias: Secondary | ICD-10-CM | POA: Diagnosis present

## 2021-10-31 DIAGNOSIS — N189 Chronic kidney disease, unspecified: Secondary | ICD-10-CM | POA: Insufficient documentation

## 2021-10-31 DIAGNOSIS — I4891 Unspecified atrial fibrillation: Secondary | ICD-10-CM | POA: Insufficient documentation

## 2021-10-31 DIAGNOSIS — E119 Type 2 diabetes mellitus without complications: Secondary | ICD-10-CM | POA: Insufficient documentation

## 2021-10-31 DIAGNOSIS — I10 Essential (primary) hypertension: Secondary | ICD-10-CM | POA: Diagnosis not present

## 2021-10-31 LAB — COMPREHENSIVE METABOLIC PANEL
ALT: 21 U/L (ref 0–44)
AST: 21 U/L (ref 15–41)
Albumin: 4 g/dL (ref 3.5–5.0)
Alkaline Phosphatase: 95 U/L (ref 38–126)
Anion gap: 8 (ref 5–15)
BUN: 34 mg/dL — ABNORMAL HIGH (ref 8–23)
CO2: 26 mmol/L (ref 22–32)
Calcium: 10 mg/dL (ref 8.9–10.3)
Chloride: 107 mmol/L (ref 98–111)
Creatinine, Ser: 1.56 mg/dL — ABNORMAL HIGH (ref 0.44–1.00)
GFR, Estimated: 36 mL/min — ABNORMAL LOW (ref >60)
Glucose, Bld: 137 mg/dL — ABNORMAL HIGH (ref 70–99)
Potassium: 3.7 mmol/L (ref 3.5–5.1)
Sodium: 141 mmol/L (ref 135–145)
Total Bilirubin: 0.4 mg/dL (ref 0.3–1.2)
Total Protein: 7.8 g/dL (ref 6.5–8.1)

## 2021-10-31 LAB — CBC WITH DIFFERENTIAL/PLATELET
Abs Immature Granulocytes: 0.01 10*3/uL (ref 0.00–0.07)
Basophils Absolute: 0 10*3/uL (ref 0.0–0.1)
Basophils Relative: 1 %
Eosinophils Absolute: 0.2 10*3/uL (ref 0.0–0.5)
Eosinophils Relative: 3 %
HCT: 38.8 % (ref 36.0–46.0)
Hemoglobin: 12.7 g/dL (ref 12.0–15.0)
Immature Granulocytes: 0 %
Lymphocytes Relative: 20 %
Lymphs Abs: 1.1 10*3/uL (ref 0.7–4.0)
MCH: 28.3 pg (ref 26.0–34.0)
MCHC: 32.7 g/dL (ref 30.0–36.0)
MCV: 86.4 fL (ref 80.0–100.0)
Monocytes Absolute: 0.4 10*3/uL (ref 0.1–1.0)
Monocytes Relative: 7 %
Neutro Abs: 4 10*3/uL (ref 1.7–7.7)
Neutrophils Relative %: 69 %
Platelets: 193 10*3/uL (ref 150–400)
RBC: 4.49 MIL/uL (ref 3.87–5.11)
RDW: 17 % — ABNORMAL HIGH (ref 11.5–15.5)
WBC: 5.7 10*3/uL (ref 4.0–10.5)
nRBC: 0 % (ref 0.0–0.2)

## 2021-10-31 LAB — IRON AND IRON BINDING CAPACITY (CC-WL,HP ONLY)
Iron: 49 ug/dL (ref 28–170)
Saturation Ratios: 15 % (ref 10.4–31.8)
TIBC: 326 ug/dL (ref 250–450)
UIBC: 277 ug/dL (ref 148–442)

## 2021-10-31 LAB — FOLATE: Folate: 43.5 ng/mL (ref >5.9)

## 2021-10-31 LAB — FERRITIN: Ferritin: 92 ng/mL (ref 11–307)

## 2021-11-08 ENCOUNTER — Other Ambulatory Visit: Payer: Self-pay | Admitting: Endocrinology

## 2021-11-08 DIAGNOSIS — D508 Other iron deficiency anemias: Secondary | ICD-10-CM

## 2021-11-09 ENCOUNTER — Other Ambulatory Visit: Payer: Self-pay | Admitting: Endocrinology

## 2021-11-09 NOTE — Progress Notes (Signed)
Office notes and request for labs to be drawn every 3 months per provider request.  Faxed to Dr. Emelda Brothers office on 6/29.  No further concerns noted at this time.

## 2021-11-13 ENCOUNTER — Other Ambulatory Visit: Payer: Self-pay | Admitting: Endocrinology

## 2021-11-13 ENCOUNTER — Other Ambulatory Visit: Payer: Self-pay | Admitting: Cardiovascular Disease

## 2021-11-13 DIAGNOSIS — I4891 Unspecified atrial fibrillation: Secondary | ICD-10-CM

## 2021-11-14 NOTE — Telephone Encounter (Signed)
Prescription refill request for Eliquis received. Indication: Atrial Fib Last office visit: 08/08/21  P Nahser MD Scr: 1.56 on 10/31/21 Age: 71 Weight: 112.1kg  Based on above findings Eliquis 5mg  twice daily is the appropriate dose.  Refill approved.

## 2021-11-16 ENCOUNTER — Other Ambulatory Visit: Payer: Self-pay

## 2021-11-16 DIAGNOSIS — D508 Other iron deficiency anemias: Secondary | ICD-10-CM

## 2021-11-16 DIAGNOSIS — E1165 Type 2 diabetes mellitus with hyperglycemia: Secondary | ICD-10-CM

## 2021-11-16 MED ORDER — EMPAGLIFLOZIN 25 MG PO TABS: 25.0000 mg | ORAL_TABLET | Freq: Every day | ORAL | 2 refills | Status: DC

## 2021-11-17 ENCOUNTER — Other Ambulatory Visit (INDEPENDENT_AMBULATORY_CARE_PROVIDER_SITE_OTHER): Payer: Medicare Other

## 2021-11-17 DIAGNOSIS — D508 Other iron deficiency anemias: Secondary | ICD-10-CM | POA: Diagnosis not present

## 2021-11-17 DIAGNOSIS — E1165 Type 2 diabetes mellitus with hyperglycemia: Secondary | ICD-10-CM

## 2021-11-17 LAB — BASIC METABOLIC PANEL
BUN: 48 mg/dL — ABNORMAL HIGH (ref 6–23)
CO2: 28 mEq/L (ref 19–32)
Calcium: 10.4 mg/dL (ref 8.4–10.5)
Chloride: 106 mEq/L (ref 96–112)
Creatinine, Ser: 1.43 mg/dL — ABNORMAL HIGH (ref 0.40–1.20)
GFR: 37.11 mL/min — ABNORMAL LOW (ref >60.00)
Glucose, Bld: 143 mg/dL — ABNORMAL HIGH (ref 70–99)
Potassium: 4.3 mEq/L (ref 3.5–5.1)
Sodium: 142 mEq/L (ref 135–145)

## 2021-11-17 LAB — CBC
HCT: 39.8 % (ref 36.0–46.0)
Hemoglobin: 12.8 g/dL (ref 12.0–15.0)
MCHC: 32.1 g/dL (ref 30.0–36.0)
MCV: 87.5 fl (ref 78.0–100.0)
Platelets: 193 10*3/uL (ref 150.0–400.0)
RBC: 4.55 Mil/uL (ref 3.87–5.11)
RDW: 17.7 % — ABNORMAL HIGH (ref 11.5–15.5)
WBC: 5.2 10*3/uL (ref 4.0–10.5)

## 2021-11-17 LAB — IBC PANEL
Iron: 76 ug/dL (ref 42–145)
Saturation Ratios: 22.2 % (ref 20.0–50.0)
TIBC: 343 ug/dL (ref 250.0–450.0)
Transferrin: 245 mg/dL (ref 212.0–360.0)

## 2021-11-20 ENCOUNTER — Other Ambulatory Visit: Payer: Self-pay | Admitting: Endocrinology

## 2021-12-12 NOTE — Progress Notes (Signed)
[facility] [place]  ASSESSMENT / PLAN: Megan Fischer is a 71 y.o. female with atherosclerosis of native arteries of right lower extremity  Recommend the following which can slow the progression of atherosclerosis and reduce the risk of major adverse cardiac / limb events:  Complete cessation from all tobacco products. Blood glucose control with goal A1c < 7%. Blood pressure control with goal blood pressure < 140/90 mmHg. Lipid reduction therapy with goal LDL-C <100 mg/dL (<70 if symptomatic from PAD).  Aspirin 33m PO QD.  Atorvastatin 40-80mg PO QD (or other "high intensity" statin therapy).  Do not think her symptoms can be attributed to arterial disease.  I suspect she has sciatica or spinal stenosis.  I reviewed this with her and encouraged her to follow-up with her primary care physician  CHIEF COMPLAINT: Right hip pain  HISTORY OF PRESENT ILLNESS: Megan WENBERGis a 71y.o. female who returns to clinic for evaluation of right lower extremity discomfort.  The patient describes pain about her hip which radiates down her thigh.  This occurs when standing or seated.  Occurs with walking or at rest.  Symptoms are not typical of intermittent claudication or ischemic rest pain.   Past Medical History:  Diagnosis Date   Atrial fibrillation ([facility]    Chronic disease anemia 03/29/2011   Diabetes mellitus    Dyslipidemia    Hypertension    Obesities, morbid ([facility]    Osteoarthritis     Past Surgical History:  Procedure Laterality Date   CHOLECYSTECTOMY     REPLACEMENT TOTAL KNEE BILATERAL     SHOULDER SURGERY     RIGHT SHOULDER   TUBAL LIGATION      Family History  Problem Relation Age of Onset   Heart attack Mother    Stroke Mother    Diabetes Mother    Hypertension Brother    Breast cancer Neg Hx     Social History   Socioeconomic History   Marital status: Married    Spouse name: Not on file   Number of children: Not on file   Years of  education: Not on file   Highest education level: Not on file  Occupational History   Not on file  Tobacco Use   Smoking status: Never   Smokeless tobacco: Never  Vaping Use   Vaping Use: Never used  Substance and Sexual Activity   Alcohol use: No   Drug use: No   Sexual activity: Not Currently  Other Topics Concern   Not on file  Social History Narrative   Not on file   Social Determinants of Health   Financial Resource Strain: Not on file  Food Insecurity: Not on file  Transportation Needs: Not on file  Physical Activity: Not on file  Stress: Not on file  Social Connections: Not on file  Intimate Partner Violence: Not on file    Allergies  Allergen Reactions   Aspirin     Other reaction(s): Unknown   Codeine    Fentanyl Other (See Comments)    hypoxia   Oxycodone Hcl    Penicillins    Chlorhexidine Gluconate Rash    Current Outpatient Medications  Medication Sig Dispense Refill   apixaban (ELIQUIS) 5 MG TABS tablet TAKE 1 TABLET BY MOUTH TWICE  DAILY 180 tablet 1   benazepril (LOTENSIN) 10 MG tablet Take 1 tablet (10 mg total) by mouth daily. 30 tablet 11   Blood Glucose Monitoring Suppl (ONETOUCH VERIO IQ SYSTEM)  W/DEVICE KIT Use to check blood sugar 2 times per day dx code 250.02 1 kit 0   CALCIUM PO Take by mouth 2 (two) times daily.     diltiazem (CARDIZEM CD) 360 MG 24 hr capsule TAKE 1 CAPSULE BY MOUTH  DAILY 100 capsule 2   empagliflozin (JARDIANCE) 25 MG TABS tablet Take 1 tablet (25 mg total) by mouth daily before breakfast. 90 tablet 2   ergocalciferol (VITAMIN D2) 50000 UNITS capsule Take 50,000 Units by mouth once a week.     ferrous sulfate 325 (65 FE) MG EC tablet Take 1 tablet (325 mg total) by mouth 2 (two) times daily. (Patient taking differently: Take 325 mg by mouth daily with breakfast. Taking 313m once daily) 60 tablet 3   furosemide (LASIX) 40 MG tablet Take 1 tablet (40 mg total) by mouth daily. 30 tablet 2   gabapentin (NEURONTIN) 300 MG  capsule Take 600 mg by mouth 3 (three) times daily.     HYDROcodone-acetaminophen (NORCO/VICODIN) 5-325 MG tablet Take 0.5-1 tablets by mouth 2 (two) times daily.     methocarbamol (ROBAXIN) 500 MG tablet Take 500 mg by mouth 2 (two) times daily.     metoprolol succinate (TOPROL-XL) 50 MG 24 hr tablet Take 1 tablet (50 mg total) by mouth daily. Take with or immediately following a meal. 30 tablet 2   ONETOUCH VERIO test strip USE AS DIRECTED TO CHECK  BLOOD SUGAR 2 TIMES DAILY 200 strip 3   rosuvastatin (CRESTOR) 10 MG tablet TAKE 1 TABLET BY MOUTH DAILY 90 tablet 3   Semaglutide, 2 MG/DOSE, (OZEMPIC, 2 MG/DOSE,) 8 MG/3ML SOPN INJECT SUBCUTANEOUSLY 2 MG EVERY WEEK 9 mL 3   No current facility-administered medications for this visit.    PHYSICAL EXAM Vitals:   12/13/21 0930  BP: 125/72  Pulse: 60  Resp: 20  Temp: 98 F (36.7 C)  SpO2: 100%  Weight: 239 lb 14.4 oz (108.8 kg)  Height: 5' 3"  (1.6 m)   Chronically ill-appearing woman in no acute distress.  Morbidly obese Regular rate and rhythm Unlabored breathing No palpable pedal pulses.  PERTINENT LABORATORY AND RADIOLOGIC DATA  Most recent CBC    Latest Ref Rng & Units 11/17/2021    8:26 AM 10/31/2021    8:42 AM 04/12/2021    4:48 AM  CBC  WBC 4.0 - 10.5 K/uL 5.2  5.7  6.3   Hemoglobin 12.0 - 15.0 g/dL 12.8  12.7  9.6   Hematocrit 36.0 - 46.0 % 39.8  38.8  30.1   Platelets 150.0 - 400.0 K/uL 193.0  193  240      Most recent CMP    Latest Ref Rng & Units 11/17/2021    8:26 AM 10/31/2021    8:42 AM 10/14/2021    8:01 AM  CMP  Glucose 70 - 99 mg/dL 143  137  126   BUN 6 - 23 mg/dL 48  34  39   Creatinine 0.40 - 1.20 mg/dL 1.43  1.56  1.32   Sodium 135 - 145 mEq/L 142  141  139   Potassium 3.5 - 5.1 mEq/L 4.3  3.7  3.9   Chloride 96 - 112 mEq/L 106  107  102   CO2 19 - 32 mEq/L 28  26  28    Calcium 8.4 - 10.5 mg/dL 10.4  10.0  10.3   Total Protein 6.5 - 8.1 g/dL  7.8    Total Bilirubin 0.3 - 1.2 mg/dL  0.4  Alkaline  Phos 38 - 126 U/L  95    AST 15 - 41 U/L  21    ALT 0 - 44 U/L  21      Renal function CrCl cannot be calculated (Patient's most recent lab result is older than the maximum 21 days allowed.).  Hgb A1c MFr Bld (%)  Date Value  10/14/2021 7.6 (H)    LDL Cholesterol  Date Value Ref Range Status  07/13/2021 79 0 - 99 mg/dL Final    Outside arterial duplex report reviewed.  This shows some evidence of right superficial femoral artery disease with reconstitution of flow in the popliteal artery and tibial vessels.  Yevonne Aline. Stanford Breed, MD [facility] Phone Number: [phone] 12/12/2021 10:31 AM  Total time spent on preparing this encounter including chart review, data review, collecting history, examining the patient, coordinating care for this established patient, 20 minutes.  Portions of this report may have been transcribed using voice recognition software.  Every effort has been made to ensure accuracy; however, inadvertent computerized transcription errors may still be present.

## 2021-12-13 ENCOUNTER — Ambulatory Visit (INDEPENDENT_AMBULATORY_CARE_PROVIDER_SITE_OTHER): Payer: Medicare Other | Admitting: Vascular Surgery

## 2021-12-13 ENCOUNTER — Encounter: Payer: Self-pay | Admitting: Vascular Surgery

## 2021-12-13 VITALS — BP 125/72 | HR 60 | Temp 98.0°F | Resp 20 | Ht 63.0 in | Wt 239.9 lb

## 2021-12-13 DIAGNOSIS — I739 Peripheral vascular disease, unspecified: Secondary | ICD-10-CM | POA: Diagnosis not present

## 2021-12-26 ENCOUNTER — Other Ambulatory Visit: Payer: Medicare Other

## 2021-12-29 ENCOUNTER — Ambulatory Visit: Payer: Medicare Other | Admitting: Endocrinology

## 2022-01-26 ENCOUNTER — Other Ambulatory Visit: Payer: Self-pay | Admitting: Endocrinology

## 2022-01-26 DIAGNOSIS — E042 Nontoxic multinodular goiter: Secondary | ICD-10-CM

## 2022-01-26 DIAGNOSIS — E1165 Type 2 diabetes mellitus with hyperglycemia: Secondary | ICD-10-CM

## 2022-01-27 ENCOUNTER — Encounter: Payer: Self-pay | Admitting: Dietician

## 2022-01-27 ENCOUNTER — Encounter: Payer: Medicare Other | Attending: Endocrinology | Admitting: Dietician

## 2022-01-27 DIAGNOSIS — N183 Chronic kidney disease, stage 3 unspecified: Secondary | ICD-10-CM | POA: Insufficient documentation

## 2022-01-27 DIAGNOSIS — E1122 Type 2 diabetes mellitus with diabetic chronic kidney disease: Secondary | ICD-10-CM | POA: Diagnosis present

## 2022-01-27 NOTE — Progress Notes (Signed)
Diabetes Self-Management Education  Visit Type: First/Initial  Appt. Start Time: 0905 Appt. End Time: 1015  01/27/2022  [name], identified by name and date of birth, is a 71 y.o. female with a diagnosis of Diabetes: Type 2.   ASSESSMENT Patient is here today alone.   She would like to learn more about meal planning.  History includesType 2 Diabete,s HTN, dyslipidemia, HTN, anemia, arthritis Medications include:  Jardiance, ozempic, crestor, elequis, Lasix, iron, calcium, vitamin D Labs noted:  BUN 48, Creatinine 1.43, Potassium 4.3, GFR 37 11/17/2021, A1C 7.6% 10/14/2021  Weight hx: 63" 246 lbs  01/27/2022 235 lbs 10/19/2021  Patient lives alone.  She is retired and last worked for [employer]. She uses medical transportation Eats only once per day.  Likes fruit but doesn't eat fruit.  Avoids beef. Lactose intolerant.  Height 5\' 3"  (1.6 m), weight 246 lb (111.6 kg). Body mass index is 43.58 kg/m.   Diabetes Self-Management Education - 01/27/22 0921       Visit Information   Visit Type First/Initial      Initial Visit   Diabetes Type Type 2    Date Diagnosed 2000    Are you currently following a meal plan? No    Are you taking your medications as prescribed? Yes      Health Coping   How would you rate your overall health? Fair      Psychosocial Assessment   Patient Belief/Attitude about Diabetes Motivated to manage diabetes    What is the hardest part about your diabetes right now, causing you the most concern, or is the most worrisome to you about your diabetes?   Making healty food and beverage choices;Being active    Self-care barriers Debilitated state due to current medical condition    Self-management support Doctor's office    Other persons present Patient    Patient Concerns Nutrition/Meal planning    Special Needs None    Preferred Learning Style No preference indicated    Learning Readiness Ready    How often do you need to have someone help  you when you read instructions, pamphlets, or other written materials from your doctor or pharmacy? 1 - Never    What is the last grade level you completed in school? 12      Pre-Education Assessment   Patient understands the diabetes disease and treatment process. Needs Instruction    Patient understands incorporating nutritional management into lifestyle. Needs Instruction    Patient undertands incorporating physical activity into lifestyle. Needs Instruction    Patient understands using medications safely. Needs Instruction    Patient understands monitoring blood glucose, interpreting and using results Needs Instruction    Patient understands prevention, detection, and treatment of acute complications. Needs Instruction    Patient understands prevention, detection, and treatment of chronic complications. Needs Instruction    Patient understands how to develop strategies to address psychosocial issues. Needs Instruction    Patient understands how to develop strategies to promote health/change behavior. Needs Instruction      Complications   Last HgB A1C per patient/outside source 7.6 %   10/14/2021   How often do you check your blood sugar? 1-2 times/day    Fasting Blood glucose range (mg/dL) 12/14/2021    Postprandial Blood glucose range (mg/dL) 95-284    Number of hypoglycemic episodes per month 1    Can you tell when your blood sugar is low? Yes    What do you do if your blood sugar  is low? drinks 4 oz coke    Number of hyperglycemic episodes ( >200mg /dL): Occasional    Can you tell when your blood sugar is high? No    Have you had a dilated eye exam in the past 12 months? Yes    Have you had a dental exam in the past 12 months? Yes    Are you checking your feet? Yes    How many days per week are you checking your feet? 7      Dietary Intake   Breakfast grits, eggs and occasional sausage    Snack (morning) none    Lunch skips    Snack (afternoon) none    Web designer, greens     Snack (evening) none    Beverage(s) water, sweet green tea, occasional cranberry juice, coffee with cream and 4 tsp sugar      Activity / Exercise   Activity / Exercise Type ADL's      Patient Education   Previous Diabetes Education No    Disease Pathophysiology Explored patient's options for treatment of their diabetes    Healthy Eating Role of diet in the treatment of diabetes and the relationship between the three main macronutrients and blood glucose level;Food label reading, portion sizes and measuring food.;Plate Method;Meal options for control of blood glucose level and chronic complications.   diet and kidneys, low sodium   Being Active Role of exercise on diabetes management, blood pressure control and cardiac health.;Helped patient identify appropriate exercises in relation to his/her diabetes, diabetes complications and other health issue.    Medications Reviewed patients medication for diabetes, action, purpose, timing of dose and side effects.    Monitoring Taught/evaluated SMBG meter.;Daily foot exams;Yearly dilated eye exam    Acute complications Taught prevention, symptoms, and  treatment of hypoglycemia - the 15 rule.;Discussed and identified patients' prevention, symptoms, and treatment of hyperglycemia.    Chronic complications Relationship between chronic complications and blood glucose control    Diabetes Stress and Support Worked with patient to identify barriers to care and solutions;Role of stress on diabetes      Individualized Goals (developed by patient)   Nutrition General guidelines for healthy choices and portions discussed    Physical Activity Exercise 3-5 times per week;15 minutes per day    Medications take my medication as prescribed    Monitoring  Test my blood glucose as discussed    Problem Solving Eating Pattern    Reducing Risk examine blood glucose patterns;do foot checks daily;treat hypoglycemia with 15 grams of carbs if blood glucose less than 70mg /dL       Post-Education Assessment   Patient understands the diabetes disease and treatment process. Comprehends key points    Patient understands incorporating nutritional management into lifestyle. Needs Review    Patient undertands incorporating physical activity into lifestyle. Comprehends key points    Patient understands using medications safely. Comphrehends key points    Patient understands monitoring blood glucose, interpreting and using results Demonstrates understanding / competency    Patient understands prevention, detection, and treatment of acute complications. Demonstrates understanding / competency    Patient understands prevention, detection, and treatment of chronic complications. Demonstrates understanding / competency    Patient understands how to develop strategies to address psychosocial issues. Demonstrates understanding / competency    Patient understands how to develop strategies to promote health/change behavior. Comprehends key points      Outcomes   Expected Outcomes Demonstrated interest in learning but significant barriers to change  Future DMSE 2 months             Individualized Plan for Diabetes Self-Management Training:   Learning Objective:  Patient will have a greater understanding of diabetes self-management. Patient education plan is to attend individual and/or group sessions per assessed needs and concerns.   Plan:   Patient Instructions  Avoid processed meat Choose only small amounts of lower sodium cheese. Avoid foods that have Phos... in the ingredient list.  Aim for 3 small balanced meals per day. Each meal should have a small amount of protein (this can be beans or peanut butter or cheese). Increase your vegetable intake Small amount of carbohydrate with each meal (beans, sweet potato, rice, corn, peas, bread, etc. Reduce or avoid added sugar in beverages  Walk daily, small amounts add up.  Walking after a meal is especially  beneficial to decrease your blood sugar.        Expected Outcomes:  Demonstrated interest in learning but significant barriers to change  Education material provided: ADA - How to Thrive: A Guide for Your Journey with Diabetes and Food label handouts, NKD national kidney diet - Dish up a Kidney-Friendly Meal for Patients with Chronic Kidney Disease (not on dialysis), Heart healthy carbohydrate balanced nutrition therapy from AND  If problems or questions, patient to contact team via:  Phone  Future DSME appointment: 2 months

## 2022-01-27 NOTE — Patient Instructions (Addendum)
Avoid processed meat Choose only small amounts of lower sodium cheese. Avoid foods that have Phos... in the ingredient list.  Aim for 3 small balanced meals per day. Each meal should have a small amount of protein (this can be beans or peanut butter or cheese). Increase your vegetable intake Small amount of carbohydrate with each meal (beans, sweet potato, rice, corn, peas, bread, etc. Reduce or avoid added sugar in beverages  Walk daily, small amounts add up.  Walking after a meal is especially beneficial to decrease your blood sugar.

## 2022-02-03 ENCOUNTER — Other Ambulatory Visit (INDEPENDENT_AMBULATORY_CARE_PROVIDER_SITE_OTHER): Payer: Medicare Other

## 2022-02-03 ENCOUNTER — Other Ambulatory Visit: Payer: Medicare Other

## 2022-02-03 DIAGNOSIS — E042 Nontoxic multinodular goiter: Secondary | ICD-10-CM | POA: Diagnosis not present

## 2022-02-03 DIAGNOSIS — E1165 Type 2 diabetes mellitus with hyperglycemia: Secondary | ICD-10-CM | POA: Diagnosis not present

## 2022-02-03 LAB — MICROALBUMIN / CREATININE URINE RATIO
Creatinine,U: 113.2 mg/dL
Microalb Creat Ratio: 8.3 mg/g (ref 0.0–30.0)
Microalb, Ur: 9.4 mg/dL — ABNORMAL HIGH (ref 0.0–1.9)

## 2022-02-03 LAB — HEMOGLOBIN A1C: Hgb A1c MFr Bld: 8 % — ABNORMAL HIGH (ref 4.6–6.5)

## 2022-02-03 LAB — T4, FREE: Free T4: 1.15 ng/dL (ref 0.60–1.60)

## 2022-02-03 LAB — T3, FREE: T3, Free: 2.7 pg/mL (ref 2.3–4.2)

## 2022-02-03 LAB — BASIC METABOLIC PANEL
BUN: 36 mg/dL — ABNORMAL HIGH (ref 6–23)
CO2: 25 mEq/L (ref 19–32)
Calcium: 10.1 mg/dL (ref 8.4–10.5)
Chloride: 108 mEq/L (ref 96–112)
Creatinine, Ser: 1.47 mg/dL — ABNORMAL HIGH (ref 0.40–1.20)
GFR: 35.85 mL/min — ABNORMAL LOW (ref >60.00)
Glucose, Bld: 117 mg/dL — ABNORMAL HIGH (ref 70–99)
Potassium: 3.9 mEq/L (ref 3.5–5.1)
Sodium: 143 mEq/L (ref 135–145)

## 2022-02-03 LAB — TSH: TSH: 0.25 u[IU]/mL — ABNORMAL LOW (ref 0.35–5.50)

## 2022-02-07 ENCOUNTER — Ambulatory Visit (INDEPENDENT_AMBULATORY_CARE_PROVIDER_SITE_OTHER): Payer: Medicare Other | Admitting: Endocrinology

## 2022-02-07 ENCOUNTER — Encounter: Payer: Self-pay | Admitting: Endocrinology

## 2022-02-07 VITALS — BP 126/84 | HR 64 | Ht 63.0 in | Wt 240.8 lb

## 2022-02-07 DIAGNOSIS — E7800 Pure hypercholesterolemia, unspecified: Secondary | ICD-10-CM | POA: Diagnosis not present

## 2022-02-07 DIAGNOSIS — E042 Nontoxic multinodular goiter: Secondary | ICD-10-CM

## 2022-02-07 DIAGNOSIS — E1165 Type 2 diabetes mellitus with hyperglycemia: Secondary | ICD-10-CM

## 2022-02-07 MED ORDER — METFORMIN HCL ER 500 MG PO TB24: 1500.0000 mg | ORAL_TABLET | Freq: Every day | ORAL | 3 refills | Status: DC

## 2022-02-07 NOTE — Patient Instructions (Addendum)
Less fruit, no juice and bread  Metformin 2 in am and 1 in pm

## 2022-02-07 NOTE — Progress Notes (Unsigned)
Patient ID: Megan Fischer, female   DOB: January 03, 1951, 71 y.o.   MRN: [id number]    Reason for Appointment: Follow-up  History of Present Illness   Diagnosis: Type 2 DIABETES MELITUS, date of diagnosis:  2000     Previous history: She has been on various regimens of treatment in the past including Byetta and generally has done better with GLP-1 drugs She continues to take low-dose glimepiride which helps with overall control. Usually blood sugars are not variable Because of occasional hypoglycemia her Amaryl was stopped in 03/2014 Was on Victoza since 2013 until 2019  Recent history: Non-insulin hypoglycemic drugs: Jardiance 25 mg daily, metformin ER 500 mg, 4 tablets at dinner  Ozempic 2 mg weekly, on Tuesdays   Current blood sugar patterns and problems identified:  Her A1c is back up to 8, in March was 7.2  She did bring her monitor for review on this visit However this appears to be programmed to 3 hours behind actual time and difficult to identify patterns However blood sugars at any given time are under 140 Unclear why her A1c is higher even though recent blood sugars are mostly within target range with only 1 reading above 180 She does appear to be checking blood sugars at all different times including after meals Recently eating more fruits and might need 2 or 3 bananas a day Also drinking juice regularly She is not doing much cooking and frequently is eating sandwiches, not eating out much She has not skipped any medications or Ozempic Weight is about the same recently Not able to do significant exercise as usual   Dinner at 5-6 pm     Side effects from medications: None  Monitors blood glucose: At least once a day.    Glucometer:  One Touch    PRE-MEAL Fasting Lunch Dinner Bedtime Overall  Glucose range: 89-134 95-198 88-126  88-198  Mean/median: 118    117   POST-MEAL PC Breakfast PC Lunch PC Dinner  Glucose range: Up to 160  113-163  Mean/median:    138    Blood sugar information by recall:  Fasting as high as 187-216 previously    Wt Readings from Last 3 Encounters:  02/07/22 240 lb 12.8 oz (109.2 kg)  01/27/22 246 lb (111.6 kg)  12/13/21 239 lb 14.4 oz (108.8 kg)   Lab Results  Component Value Date   HGBA1C 8.0 (H) 02/03/2022   HGBA1C 7.6 (H) 10/14/2021   HGBA1C 7.2 (H) 07/13/2021   Lab Results  Component Value Date   MICROALBUR 9.4 (H) 02/03/2022   LDLCALC 79 07/13/2021   CREATININE 1.47 (H) 02/03/2022   Other problems discussed today: See review of systems    Lab on 02/03/2022  Component Date Value Ref Range Status   T3, Free 02/03/2022 2.7  2.3 - 4.2 pg/mL Final   Free T4 02/03/2022 1.15  0.60 - 1.60 ng/dL Final   Comment: Specimens from patients who are undergoing biotin therapy and /or ingesting biotin supplements may contain high levels of biotin.  The higher biotin concentration in these specimens interferes with this Free T4 assay.  Specimens that contain high levels  of biotin may cause false high results for this Free T4 assay.  Please interpret results in light of the total clinical presentation of the patient.     TSH 02/03/2022 0.25 (L)  0.35 - 5.50 uIU/mL Final   Microalb, Ur 02/03/2022 9.4 (H)  0.0 - 1.9 mg/dL Final   Creatinine,U 02/03/2022  113.2  mg/dL Final   Microalb Creat Ratio 02/03/2022 8.3  0.0 - 30.0 mg/g Final   Sodium 02/03/2022 143  135 - 145 mEq/L Final   Potassium 02/03/2022 3.9  3.5 - 5.1 mEq/L Final   Chloride 02/03/2022 108  96 - 112 mEq/L Final   CO2 02/03/2022 25  19 - 32 mEq/L Final   Glucose, Bld 02/03/2022 117 (H)  70 - 99 mg/dL Final   BUN 02/03/2022 36 (H)  6 - 23 mg/dL Final   Creatinine, Ser 02/03/2022 1.47 (H)  0.40 - 1.20 mg/dL Final   GFR 02/03/2022 35.85 (L)  >60.00 mL/min Final   Calculated using the CKD-EPI Creatinine Equation (2021)   Calcium 02/03/2022 10.1  8.4 - 10.5 mg/dL Final   Hgb A1c MFr Bld 02/03/2022 8.0 (H)  4.6 - 6.5 % Final   Glycemic Control  Guidelines for People with Diabetes:Non Diabetic:  <6%Goal of Therapy: <7%Additional Action Suggested:  >8%     Allergies as of 02/07/2022       Reactions   Aspirin    Other reaction(s): Unknown   Codeine    Fentanyl Other (See Comments)   hypoxia   Oxycodone Hcl    Penicillins    Chlorhexidine Gluconate Rash        Medication List        Accurate as of February 07, 2022 11:13 AM. If you have any questions, ask your nurse or doctor.          benazepril 10 MG tablet Commonly known as: LOTENSIN Take 1 tablet (10 mg total) by mouth daily.   CALCIUM PO Take by mouth 2 (two) times daily.   diltiazem 360 MG 24 hr capsule Commonly known as: CARDIZEM CD TAKE 1 CAPSULE BY MOUTH  DAILY   Eliquis 5 MG Tabs tablet Generic drug: apixaban TAKE 1 TABLET BY MOUTH TWICE  DAILY   empagliflozin 25 MG Tabs tablet Commonly known as: Jardiance Take 1 tablet (25 mg total) by mouth daily before breakfast.   ergocalciferol 1.25 MG (50000 UT) capsule Commonly known as: VITAMIN D2 Take 50,000 Units by mouth once a week.   ferrous sulfate 325 (65 FE) MG EC tablet Take 1 tablet (325 mg total) by mouth 2 (two) times daily. What changed:  when to take this additional instructions   furosemide 40 MG tablet Commonly known as: Lasix Take 1 tablet (40 mg total) by mouth daily.   gabapentin 300 MG capsule Commonly known as: NEURONTIN Take 600 mg by mouth 3 (three) times daily.   HYDROcodone-acetaminophen 5-325 MG tablet Commonly known as: NORCO/VICODIN Take 0.5-1 tablets by mouth 2 (two) times daily.   methocarbamol 500 MG tablet Commonly known as: ROBAXIN Take 500 mg by mouth 2 (two) times daily.   metoprolol succinate 50 MG 24 hr tablet Commonly known as: TOPROL-XL Take 1 tablet (50 mg total) by mouth daily. Take with or immediately following a meal.   OneTouch Verio IQ System w/Device Kit Use to check blood sugar 2 times per day dx code 250.02   OneTouch Verio test  strip Generic drug: glucose blood USE AS DIRECTED TO CHECK  BLOOD SUGAR 2 TIMES DAILY   Ozempic (2 MG/DOSE) 8 MG/3ML Sopn Generic drug: Semaglutide (2 MG/DOSE) INJECT SUBCUTANEOUSLY 2 MG EVERY WEEK   rosuvastatin 10 MG tablet Commonly known as: CRESTOR TAKE 1 TABLET BY MOUTH DAILY        Allergies:  Allergies  Allergen Reactions   Aspirin  Other reaction(s): Unknown   Codeine    Fentanyl Other (See Comments)    hypoxia   Oxycodone Hcl    Penicillins    Chlorhexidine Gluconate Rash    Past Medical History:  Diagnosis Date   Atrial fibrillation ([place])    Chronic disease anemia 03/29/2011   Diabetes mellitus    Dyslipidemia    Hypertension    Obesities, morbid ([place])    Osteoarthritis     Past Surgical History:  Procedure Laterality Date   CHOLECYSTECTOMY     REPLACEMENT TOTAL KNEE BILATERAL     SHOULDER SURGERY     RIGHT SHOULDER   TUBAL LIGATION      Family History  Problem Relation Age of Onset   Heart attack Mother    Stroke Mother    Diabetes Mother    Hypertension Brother    Breast cancer Neg Hx     Social History:  reports that she has never smoked. She has never used smokeless tobacco. She reports that she does not drink alcohol and does not use drugs.  Review of Systems:  History of atrial fibrillation: She is on Eliquis  and followed by cardiologist  Hypertension:  she has been on multiple medications for control  She is taking diltiazem, metoprolol And Lotensin 10 Previously also on Catapres patch and HCTZ.   Mild CKD: Creatinine is consistently high but stable  Microalbumin is normal as of 8/22  BP Readings from Last 3 Encounters:  02/07/22 126/84  12/13/21 125/72  10/31/21 138/81   Lab Results  Component Value Date   CREATININE 1.47 (H) 02/03/2022   CREATININE 1.43 (H) 11/17/2021   CREATININE 1.56 (H) 10/31/2021     Lipids: Consistently well controlled with Crestor 10 mg and LDL is as follows:   Lab Results   Component Value Date   CHOL 183 07/13/2021   HDL 91.90 07/13/2021   LDLCALC 79 07/13/2021   TRIG 61.0 07/13/2021   CHOLHDL 2 07/13/2021    History of autonomous multinodular goiter No symptoms of palpitations or rapid heart rate  TSH is low normal, recently slightly low  She has normal T3 and free T4 levels consistently   Lab Results  Component Value Date   T3FREE 2.7 02/03/2022   T3FREE 3.0 04/27/2021   T3FREE 2.8 09/02/2020   T3FREE 3.5 12/23/2019      Lab Results  Component Value Date   FREET4 1.15 02/03/2022   FREET4 1.04 04/27/2021   FREET4 1.07 09/02/2020   TSH 0.25 (L) 02/03/2022   TSH 0.22 (L) 04/27/2021   TSH 0.19 (L) 09/02/2020       Examination:   BP 126/84   Pulse 64   Ht 5' 3"  (1.6 m)   Wt 240 lb 12.8 oz (109.2 kg)   SpO2 91%   BMI 42.66 kg/m   Body mass index is 42.66 kg/m.     ASSESSMENT/ PLAN:   Diabetes type 2, with severe obesity  Current regimen is 2000 mg metformin, Jardiance 25 mg and Ozempic 2 mg weekly  See history of present illness for detailed discussion of current diabetes management, blood sugar patterns and problems identified  Her A1c is 8% and increasing  Her blood sugars are likely higher when she is having excessive amounts of carbohydrates, juices and fruits but do not appear to be high recently at home This is despite increasing her Vania Rea She has just seen the dietitian and likely can start making more changes Discussed eliminating juices and restricting  portions of fruits and only having them at mealtimes  Mild CKD: This is persistent and stable No microalbuminuria  Autonomous thyroid with goiter: No change in overall functions  HYPERTENSION:  Blood pressure is well controlled with current regimen and will continue to monitor   Elayne Snare 02/07/2022, 11:13 AM   Note: This office note was prepared with Dragon voice recognition system technology. Any transcriptional errors that result from this process  are unintentional.

## 2022-04-06 ENCOUNTER — Encounter: Payer: Self-pay | Admitting: Dietician

## 2022-04-06 ENCOUNTER — Encounter: Payer: Medicare Other | Attending: Endocrinology | Admitting: Dietician

## 2022-04-06 VITALS — Wt 230.0 lb

## 2022-04-06 DIAGNOSIS — N1832 Chronic kidney disease, stage 3b: Secondary | ICD-10-CM | POA: Insufficient documentation

## 2022-04-06 DIAGNOSIS — E118 Type 2 diabetes mellitus with unspecified complications: Secondary | ICD-10-CM | POA: Insufficient documentation

## 2022-04-06 NOTE — Progress Notes (Signed)
Diabetes Self-Management Education  Visit Type: Follow-up  Appt. Start Time: 0915 Appt. End Time: 0945  04/06/2022  [name], identified by name and date of birth, is a 71 y.o. female with a diagnosis of Diabetes: Type 2.   ASSESSMENT Patient is here today alone.  She was last seen by this RD on 01/27/2022.  Lost 16 lbs in the past 2 1/2 months - "water weight" She has stopped drinking juice but is drinking tea with increased honey. Eating more vegetables and home cooked meals and less sandwiches.   She is not exercising due to pain in her legs and back.  Noted increased fluid in her legs today. Fasting blood glucose today 119 and post meal 140.  History includesType 2 Diabete,s HTN, dyslipidemia, HTN, anemia, arthritis Medications include:  Metformin, Jardiance, ozempic, crestor, elequis, Lasix, iron, calcium, vitamin D Labs noted:  BUN 48, Creatinine 1.43, Potassium 4.3, GFR 37 11/17/2021, A1C 8% 02/03/2022 increased from 7.6% 10/14/2021   Weight hx: 63" 230 lbs 04/06/2022 240 lbs 02/07/2022 246 lbs  01/27/2022 235 lbs 10/19/2021   Patient lives alone.  She is retired and last worked for [employer]. She uses medical transportation Eats only once per day.  Likes fruit but doesn't eat fruit.  Avoids beef. Lactose intolerant.  No salt in cooking.  Now avoiding jucie.  Limits bread and eats occasional sandwich. Limits red meat.  Weight 230 lb (104.3 kg). Body mass index is 40.74 kg/m.   Diabetes Self-Management Education - 04/06/22 0955       Visit Information   Visit Type Follow-up      Initial Visit   Diabetes Type Type 2    Are you taking your medications as prescribed? Yes      Psychosocial Assessment   Patient Belief/Attitude about Diabetes Motivated to manage diabetes    What is the hardest part about your diabetes right now, causing you the most concern, or is the most worrisome to you about your diabetes?   Making healty food and beverage choices     Self-care barriers Debilitated state due to current medical condition    Self-management support Doctor's office;CDE visits    Other persons present Patient    Patient Concerns Nutrition/Meal planning    Special Needs None    Preferred Learning Style No preference indicated      Pre-Education Assessment   Patient understands the diabetes disease and treatment process. Comprehends key points    Patient understands incorporating nutritional management into lifestyle. Comprehends key points    Patient undertands incorporating physical activity into lifestyle. Comprehends key points    Patient understands using medications safely. Comprehends key points    Patient understands monitoring blood glucose, interpreting and using results Comprehends key points    Patient understands prevention, detection, and treatment of acute complications. Comprehends key points    Patient understands prevention, detection, and treatment of chronic complications. Compreheands key points    Patient understands how to develop strategies to address psychosocial issues. Comprehends key points    Patient understands how to develop strategies to promote health/change behavior. Comprehends key points      Complications   Last HgB A1C per patient/outside source 8 %   02/03/2022   How often do you check your blood sugar? 1-2 times/day    Fasting Blood glucose range (mg/dL) 68-257    Postprandial Blood glucose range (mg/dL) 493-552    Can you tell when your blood sugar is low? No  Dietary Intake   Breakfast grits, egg, tenderloin    Snack (morning) none    Lunch salad with [place] and ranch    Snack (afternoon) none    Dinner stewed chicken, small portion rice, okra    Snack (evening) none    Beverage(s) water, occasional tea with honey      Activity / Exercise   Activity / Exercise Type ADL's      Patient Education   Previous Diabetes Education Yes (please comment)   01/2022   Healthy Eating Meal options for  control of blood glucose level and chronic complications.    Being Active Helped patient identify appropriate exercises in relation to his/her diabetes, diabetes complications and other health issue.    Monitoring Identified appropriate SMBG and/or A1C goals.    Diabetes Stress and Support Worked with patient to identify barriers to care and solutions      Individualized Goals (developed by patient)   Nutrition General guidelines for healthy choices and portions discussed    Physical Activity Exercise 3-5 times per week;15 minutes per day    Problem Solving Eating Pattern    Reducing Risk examine blood glucose patterns;do foot checks daily;treat hypoglycemia with 15 grams of carbs if blood glucose less than 70mg /dL      Patient Self-Evaluation of Goals - Patient rates self as meeting previously set goals (% of time)   Nutrition 50 - 75 % (half of the time)    Physical Activity < 25% (hardly ever/never)    Medications >75% (most of the time)    Monitoring >75% (most of the time)    Problem Solving and behavior change strategies  50 - 75 % (half of the time)    Reducing Risk (treating acute and chronic complications) 25 - 50% (sometimes)    Health Coping 50 - 75 % (half of the time)      Post-Education Assessment   Patient understands the diabetes disease and treatment process. Demonstrates understanding / competency    Patient understands incorporating nutritional management into lifestyle. Comprehends key points    Patient undertands incorporating physical activity into lifestyle. Comprehends key points    Patient understands using medications safely. Comphrehends key points    Patient understands monitoring blood glucose, interpreting and using results Comprehends key points    Patient understands prevention, detection, and treatment of acute complications. Comprehends key points    Patient understands prevention, detection, and treatment of chronic complications. Comprehends key points     Patient understands how to develop strategies to address psychosocial issues. Comprehends key points    Patient understands how to develop strategies to promote health/change behavior. Comprehends key points      Outcomes   Expected Outcomes Demonstrated interest in learning. Expect positive outcomes    Future DMSE 3-4 months    Program Status Not Completed      Subsequent Visit   Since your last visit have you continued or begun to take your medications as prescribed? Yes    Since your last visit have you experienced any weight changes? Loss    Weight Loss (lbs) 16    Since your last visit, are you checking your blood glucose at least once a day? Yes             Individualized Plan for Diabetes Self-Management Training:   Learning Objective:  Patient will have a greater understanding of diabetes self-management. Patient education plan is to attend individual and/or group sessions per assessed needs and concerns.  Plan:   Patient Instructions  [employer] job drinking more water! Limit/avoid honey in your tea Continue to avoid juice Continue to eat consistent meals with vegetables and small amount of protein (egg, chicken, fish, beans) Continue to choose a salad rather than sandwich  Small amounts of exercise each day.  Each little bit adds up.  Avoid sitting for more than 1 hour at a time.    Expected Outcomes:  Demonstrated interest in learning. Expect positive outcomes  Education material provided:   If problems or questions, patient to contact team via:  Phone  Future DSME appointment: 3-4 months

## 2022-04-06 NOTE — Patient Instructions (Addendum)
Great job drinking more water! Limit/avoid honey in your tea Continue to avoid juice Continue to eat consistent meals with vegetables and small amount of protein (egg, chicken, fish, beans) Continue to choose a salad rather than sandwich  Small amounts of exercise each day.  Each little bit adds up.  Avoid sitting for more than 1 hour at a time.

## 2022-04-22 ENCOUNTER — Other Ambulatory Visit: Payer: Self-pay | Admitting: Cardiovascular Disease

## 2022-04-22 ENCOUNTER — Emergency Department (HOSPITAL_COMMUNITY)
Admission: EM | Admit: 2022-04-22 | Discharge: 2022-04-22 | Discharge status: Against Medical Advice | Payer: Medicare Other | Attending: Medical | Admitting: Medical

## 2022-04-22 ENCOUNTER — Encounter (HOSPITAL_COMMUNITY): Payer: Self-pay

## 2022-04-22 ENCOUNTER — Other Ambulatory Visit: Payer: Self-pay

## 2022-04-22 ENCOUNTER — Emergency Department (HOSPITAL_BASED_OUTPATIENT_CLINIC_OR_DEPARTMENT_OTHER)
Admission: EM | Admit: 2022-04-22 | Discharge: 2022-04-22 | Disposition: A | Discharge status: Home / Self Care | Payer: Medicare Other | Attending: Emergency Medicine | Admitting: Emergency Medicine

## 2022-04-22 ENCOUNTER — Encounter (HOSPITAL_BASED_OUTPATIENT_CLINIC_OR_DEPARTMENT_OTHER): Payer: Self-pay | Admitting: Emergency Medicine

## 2022-04-22 ENCOUNTER — Other Ambulatory Visit: Payer: Self-pay | Admitting: Endocrinology

## 2022-04-22 ENCOUNTER — Emergency Department (HOSPITAL_COMMUNITY): Payer: Medicare Other

## 2022-04-22 DIAGNOSIS — W19XXXA Unspecified fall, initial encounter: Secondary | ICD-10-CM | POA: Insufficient documentation

## 2022-04-22 DIAGNOSIS — E119 Type 2 diabetes mellitus without complications: Secondary | ICD-10-CM | POA: Diagnosis not present

## 2022-04-22 DIAGNOSIS — I4891 Unspecified atrial fibrillation: Secondary | ICD-10-CM | POA: Insufficient documentation

## 2022-04-22 DIAGNOSIS — Z79899 Other long term (current) drug therapy: Secondary | ICD-10-CM | POA: Insufficient documentation

## 2022-04-22 DIAGNOSIS — Z7984 Long term (current) use of oral hypoglycemic drugs: Secondary | ICD-10-CM | POA: Diagnosis not present

## 2022-04-22 DIAGNOSIS — Z96652 Presence of left artificial knee joint: Secondary | ICD-10-CM | POA: Diagnosis not present

## 2022-04-22 DIAGNOSIS — E1165 Type 2 diabetes mellitus with hyperglycemia: Secondary | ICD-10-CM

## 2022-04-22 DIAGNOSIS — I110 Hypertensive heart disease with heart failure: Secondary | ICD-10-CM | POA: Diagnosis not present

## 2022-04-22 DIAGNOSIS — M25562 Pain in left knee: Secondary | ICD-10-CM | POA: Insufficient documentation

## 2022-04-22 DIAGNOSIS — I5030 Unspecified diastolic (congestive) heart failure: Secondary | ICD-10-CM | POA: Diagnosis not present

## 2022-04-22 DIAGNOSIS — Z7901 Long term (current) use of anticoagulants: Secondary | ICD-10-CM | POA: Insufficient documentation

## 2022-04-22 DIAGNOSIS — Z5321 Procedure and treatment not carried out due to patient leaving prior to being seen by health care provider: Secondary | ICD-10-CM | POA: Diagnosis not present

## 2022-04-22 LAB — CBC WITH DIFFERENTIAL/PLATELET
Abs Immature Granulocytes: 0.01 10*3/uL (ref 0.00–0.07)
Basophils Absolute: 0 10*3/uL (ref 0.0–0.1)
Basophils Relative: 1 %
Eosinophils Absolute: 0.1 10*3/uL (ref 0.0–0.5)
Eosinophils Relative: 2 %
HCT: 42.2 % (ref 36.0–46.0)
Hemoglobin: 13.6 g/dL (ref 12.0–15.0)
Immature Granulocytes: 0 %
Lymphocytes Relative: 28 %
Lymphs Abs: 1.7 10*3/uL (ref 0.7–4.0)
MCH: 29.2 pg (ref 26.0–34.0)
MCHC: 32.2 g/dL (ref 30.0–36.0)
MCV: 90.6 fL (ref 80.0–100.0)
Monocytes Absolute: 0.4 10*3/uL (ref 0.1–1.0)
Monocytes Relative: 7 %
Neutro Abs: 3.8 10*3/uL (ref 1.7–7.7)
Neutrophils Relative %: 62 %
Platelets: 242 10*3/uL (ref 150–400)
RBC: 4.66 MIL/uL (ref 3.87–5.11)
RDW: 15.8 % — ABNORMAL HIGH (ref 11.5–15.5)
WBC: 6.1 10*3/uL (ref 4.0–10.5)
nRBC: 0 % (ref 0.0–0.2)

## 2022-04-22 LAB — COMPREHENSIVE METABOLIC PANEL
ALT: 17 U/L (ref 0–44)
AST: 27 U/L (ref 15–41)
Albumin: 4 g/dL (ref 3.5–5.0)
Alkaline Phosphatase: 88 U/L (ref 38–126)
Anion gap: 9 (ref 5–15)
BUN: 31 mg/dL — ABNORMAL HIGH (ref 8–23)
CO2: 25 mmol/L (ref 22–32)
Calcium: 9.6 mg/dL (ref 8.9–10.3)
Chloride: 104 mmol/L (ref 98–111)
Creatinine, Ser: 1.27 mg/dL — ABNORMAL HIGH (ref 0.44–1.00)
GFR, Estimated: 45 mL/min — ABNORMAL LOW (ref >60)
Glucose, Bld: 152 mg/dL — ABNORMAL HIGH (ref 70–99)
Potassium: 3.8 mmol/L (ref 3.5–5.1)
Sodium: 138 mmol/L (ref 135–145)
Total Bilirubin: 0.6 mg/dL (ref 0.3–1.2)
Total Protein: 8.1 g/dL (ref 6.5–8.1)

## 2022-04-22 MED ORDER — HYDROCODONE-ACETAMINOPHEN 5-325 MG PO TABS
1.0000 | ORAL_TABLET | Freq: Once | ORAL | Status: AC
  Administered 2022-04-22: 1 via ORAL
  Filled 2022-04-22: qty 1

## 2022-04-22 NOTE — ED Provider Triage Note (Signed)
Emergency Medicine Provider Triage Evaluation Note  Megan Fischer , a 71 y.o. female  was evaluated in triage.  Pt complains of left knee pain after falling today. Denies chest pain, SOB, or dizziness prior to fall. Hx of recurrent falls. +BT. No head trauma. Able to ambulate.  Review of Systems  Positive: L knee pain Negative: Headache.  Physical Exam  BP (!) 176/104 (BP Location: Left Arm)   Pulse 84   Temp 98 F (36.7 C) (Oral)   Resp 18   SpO2 100%  Gen:   Awake, no distress   Resp:  Normal effort  MSK:   Moves extremities without difficulty  Other:  +diffuse ttp of L knee  Medical Decision Making  Medically screening exam initiated at 11:02 AM.  Appropriate orders placed.  Megan Fischer was informed that the remainder of the evaluation will be completed by another provider, this initial triage assessment does not replace that evaluation, and the importance of remaining in the ED until their evaluation is complete.     Pete Pelt, [place] 04/22/22 1104

## 2022-04-22 NOTE — ED Triage Notes (Signed)
Pt arrived via POV, c/o fall today and pain to left knee. States hx of multiple falls post knee replacement.

## 2022-04-22 NOTE — ED Triage Notes (Signed)
Pt reports LT knee pain s/p fall today; was seen at [facility] (had Xr, labs), but left d/t wait time

## 2022-04-22 NOTE — ED Notes (Signed)
Pt in bathroom at this time.

## 2022-04-22 NOTE — ED Notes (Signed)
Applied ace wrap to knee.

## 2022-04-22 NOTE — Discharge Instructions (Addendum)
Thank you for coming to [facility] Emergency Department. You were seen for traumatic left knee pain. We did an exam, labs, and imaging, and these showed an old fracture but no new fractures. You were given a knee brace for comfort. Please rest, ice, and elevate the knee. You can take tylenol and ibuprofen for pain control. Please call your orthopedic surgeon on Monday morning to make an appointment for this week.  Do not hesitate to return to the ED or call 911 if you experience: -Worsening symptoms -Another fall -Pain so severe you cannot manage it at home -Lightheadedness, passing out -Fevers/chills -Anything else that concerns you

## 2022-04-22 NOTE — ED Provider Notes (Signed)
[facility] Provider Note   CSN: [id number] Arrival date & time: 04/22/22  1908     History  Chief Complaint  Patient presents with   Knee Pain    Megan Fischer is a 71 y.o. female with afib on eliquis, morbid obesity, T2DM, IDA, HLD, diastolic HF,  presents with knee pain.   Pt reports left knee pain after a fall that occurred today. She went today to be seen at [facility] (had X-ray and labs there), but left d/t wait time. She states she has had pain in the left knee on and off ever since a replacement she had many years ago but fell on the knee today after tripping and pain worsened. She did not hit her head or lose consciousness. No new numbness/tingling, no f/c or recent illnesses. She is able to walk on the knee just with some pain.    Knee Pain      Home Medications Prior to Admission medications   Medication Sig Start Date End Date Taking? Authorizing Provider  apixaban (ELIQUIS) 5 MG TABS tablet TAKE 1 TABLET BY MOUTH TWICE  DAILY 11/14/21   Nahser, Wonda Cheng, MD  benazepril (LOTENSIN) 10 MG tablet Take 1 tablet (10 mg total) by mouth daily. 04/12/21 04/12/22  Georgette Shell, MD  Blood Glucose Monitoring Suppl (ONETOUCH VERIO IQ SYSTEM) W/DEVICE KIT Use to check blood sugar 2 times per day dx code 250.02 09/12/13   Elayne Snare, MD  CALCIUM PO Take by mouth 2 (two) times daily.    [provider]  diltiazem (CARDIZEM CD) 360 MG 24 hr capsule TAKE 1 CAPSULE BY MOUTH  DAILY 11/21/21   Elayne Snare, MD  empagliflozin (JARDIANCE) 25 MG TABS tablet Take 1 tablet (25 mg total) by mouth daily before breakfast. 11/16/21   Elayne Snare, MD  ergocalciferol (VITAMIN D2) 50000 UNITS capsule Take 50,000 Units by mouth once a week.    [provider]  ferrous sulfate 325 (65 FE) MG EC tablet Take 1 tablet (325 mg total) by mouth 2 (two) times daily. Patient taking differently: Take 325 mg by mouth daily with breakfast. Taking 34m once daily  09/27/14   FTruitt Merle MD  furosemide (LASIX) 40 MG tablet Take 1 tablet (40 mg total) by mouth daily. 04/12/21 04/12/22  MGeorgette Shell MD  gabapentin (NEURONTIN) 300 MG capsule Take 600 mg by mouth 3 (three) times daily. 06/17/20   [provider]  HYDROcodone-acetaminophen (NORCO/VICODIN) 5-325 MG tablet Take 0.5-1 tablets by mouth 2 (two) times daily. 09/14/21   [provider]  metFORMIN (GLUCOPHAGE-XR) 500 MG 24 hr tablet Take 3 tablets (1,500 mg total) by mouth daily with supper. 02/07/22   KElayne Snare MD  methocarbamol (ROBAXIN) 500 MG tablet Take 500 mg by mouth 2 (two) times daily. 08/23/21   [provider]  metoprolol succinate (TOPROL-XL) 50 MG 24 hr tablet Take 1 tablet (50 mg total) by mouth daily. Take with or immediately following a meal. 04/13/21   MGeorgette Shell MD  [facility] test strip USE AS DIRECTED TO CHECK  BLOOD SUGAR 2 TIMES DAILY 02/03/21   KElayne Snare MD  rosuvastatin (CRESTOR) 10 MG tablet TAKE 1 TABLET BY MOUTH DAILY 11/14/21   KElayne Snare MD  Semaglutide, 2 MG/DOSE, (OZEMPIC, 2 MG/DOSE,) 8 MG/3ML SOPN INJECT SUBCUTANEOUSLY 2 MG EVERY WEEK 11/09/21   KElayne Snare MD      Allergies    Aspirin, Codeine, Fentanyl, Oxycodone hcl, Penicillins, and  Chlorhexidine gluconate    Review of Systems   Review of Systems Review of systems Negative for f/c.  A 10 point review of systems was performed and is negative unless otherwise reported in HPI.  Physical Exam Updated Vital Signs BP (!) 178/101   Pulse 79   Temp (!) 97.5 F (36.4 C) (Oral)   Resp 18   Ht _0  (1.6 m)   Wt 104.3 kg   SpO2 99%   BMI 40.74 kg/m  Physical Exam General: Normal appearing obese female, lying in bed.  HEENT: Sclera anicteric, MMM, trachea midline.  Cardiology: RRR, no murmurs/rubs/gallops. BL radial and DP pulses equal bilaterally.  Resp: Normal respiratory rate and effort. CTAB, no wheezes, rhonchi, crackles.  Abd: Soft, non-tender, non-distended. No  rebound tenderness or guarding.  GU: Deferred. MSK: No signs of trauma. No swelling, erythema. ROM limited by pain. +Pain with PROM and active. No open wounds. Intact distal DP/PT pulses, intact ROM of ankle/foot. Compartments are soft, non-tender.   No peripheral edema or signs of trauma. Extremities without deformity or TTP. Skin: warm, dry. No rashes or lesions. Neuro: A&Ox4, CNs II-XII grossly intact. MAEs. Sensation grossly intact.  Psych: Normal mood and affect.   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 Deformity in the proximal shaft of fibula would be consistent with old healed fracture. There is 3.8 x 2 cm smooth marginated calcification along the lateral aspect of distal left femur. There is 7 mm distraction of this fracture fragment from the rest of the femur. IMPRESSION: Postsurgical changes are noted. There is 3.8 x 2 cm smooth marginated calcification along the lateral aspect of distal left femur. This may suggest old ununited fracture. Possibility of re-injury at the site of old fracture is not excluded. Please correlate with clinical symptoms and physical examination findings. Electronically Signed   By: Elmer Picker M.D.   On: 04/22/2022 11:49    Procedures Procedures    Medications Ordered in ED Medications  HYDROcodone-acetaminophen (NORCO/VICODIN) 5-325 MG per tablet 1 tablet (1 tablet Oral Given 04/22/22 2227)    ED Course/ Medical Decision Making/ A&P                          Medical Decision Making Risk Prescription drug management.    MDM:    Patient reports w/  acute traumatic on chronic knee pain in a knee s/p TKA. She has diffuse tenderness to palpation without any obvious injuries or dislocations. Compartments are soft, no c/f compartment syndrome. No c/f septic arthritis given chronic history of pain and now pain triggered by trauma, no noted effusion/erythema. Consider orthopedic emergencies such as tibial plateau fracture, hardware complication, dislocation. XRs obtain at [facility] demonstrate possible re-injury at site of an old fracture at distal left femur. Patient has no tenderness here on exam, doubt clinically significant re-injury at this time. Otherwise XR is unremarkable. Patient is stable to f/u as an outpatient. Patient is ambulatory and has an orthopedic surgeon. I discussed with patient that she may need further imaging and evaluation by an orthopedic surgeon. Patient reports  understanding. I discussed with her RICE and patient is provided an ace wrap for comfort and compression. She is instructed to call her orthopedic surgeon in the AM and f/u in the next 1-2 weeks. Discussed tylenol/ibuprofen for pain control.   Clinical Course as of 05/16/22 1641  Sat Apr 22, 2022  2136 R knee XR from 04/21/22: IMPRESSION: Postsurgical changes are noted. There is 3.8 x 2 cm smooth marginated calcification along the lateral aspect of distal left femur. This may suggest old ununited fracture. Possibility of re-injury at the site of old fracture is not excluded. Please correlate with clinical symptoms and physical examination findings.  [HN]    Clinical Course User Index [HN] Audley Hose, MD     Labs: I Ordered, and personally interpreted labs.  The pertinent results include:  Cr 1.27 (less than patient's baseline), BUN 31, otherwise unremarkable CMP. Unremarkable CBC.   Imaging Studies ordered: Imaging studies were ordered at [facility] including XR I independently visualized and interpreted imaging. I agree with the radiologist  interpretation  Additional history obtained from chart review.    Social Determinants of Health: Patient lives independently  Disposition: DC w/ DC instructions, return precautions, ACE wrap, instructions for RICE and to call her orthopedic surgeon this week for f/u.   Co morbidities that complicate the patient evaluation  Past Medical History:  Diagnosis Date   Atrial fibrillation ([place])    Chronic disease anemia 03/29/2011   Diabetes mellitus    Dyslipidemia    Hypertension    Obesities, morbid ([place])    Osteoarthritis      Medicines No orders of the defined types were placed in this encounter.   I have reviewed the patients home medicines and have made adjustments as needed  Problem List / ED Course: Problem List Items Addressed This Visit   None Visit Diagnoses     Acute pain of left knee    -  Primary                This note was created using dictation software, which may contain spelling or grammatical errors.    Audley Hose, MD 05/16/22 [phone]

## 2022-04-22 NOTE — ED Notes (Signed)
Aware we need a recollect on light green top. Ebony aware and in agreement to obtain lab.

## 2022-04-24 NOTE — Telephone Encounter (Signed)
Prescription refill request for Eliquis received. Indication:afib Last office visit:4/23 Scr:1.2 Age: 71 Weight:104.3  kg  Prescription refilled

## 2022-05-19 ENCOUNTER — Encounter: Payer: Self-pay | Admitting: Hematology

## 2022-05-26 ENCOUNTER — Other Ambulatory Visit (INDEPENDENT_AMBULATORY_CARE_PROVIDER_SITE_OTHER): Payer: 59

## 2022-05-26 DIAGNOSIS — E1165 Type 2 diabetes mellitus with hyperglycemia: Secondary | ICD-10-CM | POA: Diagnosis not present

## 2022-05-26 DIAGNOSIS — E7800 Pure hypercholesterolemia, unspecified: Secondary | ICD-10-CM

## 2022-05-26 LAB — LIPID PANEL
Cholesterol: 184 mg/dL (ref 0–200)
HDL: 81.4 mg/dL (ref >39.00)
LDL Cholesterol: 89 mg/dL (ref 0–99)
NonHDL: 102.63
Total CHOL/HDL Ratio: 2
Triglycerides: 68 mg/dL (ref 0.0–149.0)
VLDL: 13.6 mg/dL (ref 0.0–40.0)

## 2022-05-26 LAB — COMPREHENSIVE METABOLIC PANEL
ALT: 25 U/L (ref 0–35)
AST: 32 U/L (ref 0–37)
Albumin: 4.4 g/dL (ref 3.5–5.2)
Alkaline Phosphatase: 106 U/L (ref 39–117)
BUN: 18 mg/dL (ref 6–23)
CO2: 27 mEq/L (ref 19–32)
Calcium: 10.7 mg/dL — ABNORMAL HIGH (ref 8.4–10.5)
Chloride: 105 mEq/L (ref 96–112)
Creatinine, Ser: 1.22 mg/dL — ABNORMAL HIGH (ref 0.40–1.20)
GFR: 44.74 mL/min — ABNORMAL LOW (ref >60.00)
Glucose, Bld: 132 mg/dL — ABNORMAL HIGH (ref 70–99)
Potassium: 3.9 mEq/L (ref 3.5–5.1)
Sodium: 145 mEq/L (ref 135–145)
Total Bilirubin: 0.7 mg/dL (ref 0.2–1.2)
Total Protein: 8.7 g/dL — ABNORMAL HIGH (ref 6.0–8.3)

## 2022-05-26 LAB — HEMOGLOBIN A1C: Hgb A1c MFr Bld: 7.4 % — ABNORMAL HIGH (ref 4.6–6.5)

## 2022-05-30 ENCOUNTER — Encounter: Payer: Self-pay | Admitting: Endocrinology

## 2022-05-30 ENCOUNTER — Ambulatory Visit (INDEPENDENT_AMBULATORY_CARE_PROVIDER_SITE_OTHER): Payer: 59 | Admitting: Endocrinology

## 2022-05-30 VITALS — BP 114/78 | HR 71 | Ht 63.0 in | Wt 224.6 lb

## 2022-05-30 DIAGNOSIS — E7800 Pure hypercholesterolemia, unspecified: Secondary | ICD-10-CM | POA: Diagnosis not present

## 2022-05-30 DIAGNOSIS — E042 Nontoxic multinodular goiter: Secondary | ICD-10-CM

## 2022-05-30 DIAGNOSIS — E1165 Type 2 diabetes mellitus with hyperglycemia: Secondary | ICD-10-CM

## 2022-05-30 NOTE — Progress Notes (Signed)
Patient ID: Megan Fischer, female   DOB: 04/27/1951, 72 y.o.   MRN: [id number]    Reason for Appointment: Follow-up  History of Present Illness   Diagnosis: Type 2 DIABETES MELITUS, date of diagnosis:  2000     Previous history: She has been on various regimens of treatment in the past including Byetta and generally has done better with GLP-1 drugs She continues to take low-dose glimepiride which helps with overall control. Usually blood sugars are not variable Because of occasional hypoglycemia her Amaryl was stopped in 03/2014 Was on Victoza since 2013 until 2019  Recent history: Non-insulin hypoglycemic drugs:  Jardiance 25 mg daily, metformin ER 500 mg, 4 tablets at dinner  Ozempic 2 mg weekly   Current blood sugar patterns and problems identified:  Her A1c is better at 7.4 compared to 8  Left heel A1c was high on the last visit but her home blood sugars are fairly good However she has seen the dietitian and September and November last year and likely is doing better with her diet Also recently weight appears to be better Likely has cut back on carbohydrates She is still having slightly high readings in the mornings her blood sugars are averaging consistently below 140 at any given time Postprandial readings are not significantly high, highest 179 in the evening She has not had normal Ozempic 2 mg dose consistently and at times had to get the 1 mg dose Not able to do significant exercise because of leg issues   Dinner at 5-6 pm     Side effects from medications: None  Monitors blood glucose: At least once a day.    Glucometer:  One Touch    PRE-MEAL Fasting Lunch Dinner Bedtime Overall  Glucose range: 123-151    95-179  Mean/median: 136  125  130   POST-MEAL PC Breakfast PC Lunch PC Dinner  Glucose range:   109-179  Mean/median:   140   Prior  PRE-MEAL Fasting Lunch Dinner Bedtime Overall  Glucose range: 89-134 95-198 88-126  88-198  Mean/median:  118    117   POST-MEAL PC Breakfast PC Lunch PC Dinner  Glucose range: Up to 160  113-163  Mean/median:   138      Wt Readings from Last 3 Encounters:  05/30/22 224 lb 9.6 oz (101.9 kg)  04/22/22 230 lb (104.3 kg)  04/06/22 230 lb (104.3 kg)   Lab Results  Component Value Date   HGBA1C 7.4 (H) 05/26/2022   HGBA1C 8.0 (H) 02/03/2022   HGBA1C 7.6 (H) 10/14/2021   Lab Results  Component Value Date   MICROALBUR 9.4 (H) 02/03/2022   LDLCALC 89 05/26/2022   CREATININE 1.22 (H) 05/26/2022   Other problems discussed today: See review of systems    Lab on 05/26/2022  Component Date Value Ref Range Status   Cholesterol 05/26/2022 184  0 - 200 mg/dL Final   ATP III Classification       Desirable:  < 200 mg/dL               Borderline High:  200 - 239 mg/dL          High:  > = 240 mg/dL   Triglycerides 05/26/2022 68.0  0.0 - 149.0 mg/dL Final   Normal:  <150 mg/dLBorderline High:  150 - 199 mg/dL   HDL 05/26/2022 81.40  >39.00 mg/dL Final   VLDL 05/26/2022 13.6  0.0 - 40.0 mg/dL  Final   LDL Cholesterol 05/26/2022 89  0 - 99 mg/dL Final   Total CHOL/HDL Ratio 05/26/2022 2   Final                  Men          Women1/2 Average Risk     3.4          3.3Average Risk          5.0          4.42X Average Risk          9.6          7.13X Average Risk          15.0          11.0                       NonHDL 05/26/2022 102.63   Final   NOTE:  Non-HDL goal should be 30 mg/dL higher than patient's LDL goal (i.e. LDL goal of < 70 mg/dL, would have non-HDL goal of < 100 mg/dL)   Hgb V9D MFr Bld 63/87/5643 7.4 (H)  4.6 - 6.5 % Final   Glycemic Control Guidelines for People with Diabetes:Non Diabetic:  <6%Goal of Therapy: <7%Additional Action Suggested:  >8%    Sodium 05/26/2022 145  135 - 145 mEq/L Final   Potassium 05/26/2022 3.9  3.5 - 5.1 mEq/L Final   Chloride 05/26/2022 105  96 - 112 mEq/L Final   CO2 05/26/2022 27  19 - 32 mEq/L Final   Glucose, Bld 05/26/2022 132 (H)  70 - 99 mg/dL Final    BUN 32/95/1884 18  6 - 23 mg/dL Final   Creatinine, Ser 05/26/2022 1.22 (H)  0.40 - 1.20 mg/dL Final   Total Bilirubin 05/26/2022 0.7  0.2 - 1.2 mg/dL Final   Alkaline Phosphatase 05/26/2022 106  39 - 117 U/L Final   AST 05/26/2022 32  0 - 37 U/L Final   ALT 05/26/2022 25  0 - 35 U/L Final   Total Protein 05/26/2022 8.7 (H)  6.0 - 8.3 g/dL Final   Albumin 16/60/6301 4.4  3.5 - 5.2 g/dL Final   GFR 60/02/9322 44.74 (L)  >60.00 mL/min Final   Calculated using the CKD-EPI Creatinine Equation (2021)   Calcium 05/26/2022 10.7 (H)  8.4 - 10.5 mg/dL Final    Allergies as of 05/30/2022       Reactions   Aspirin    Other reaction(s): Unknown   Codeine    Fentanyl Other (See Comments)   hypoxia   Oxycodone Hcl    Penicillins    Chlorhexidine Gluconate Rash        Medication List        Accurate as of May 30, 2022  9:27 AM. If you have any questions, ask your nurse or doctor.          benazepril 10 MG tablet Commonly known as: LOTENSIN Take 1 tablet (10 mg total) by mouth daily.   CALCIUM PO Take by mouth 2 (two) times daily.   diltiazem 360 MG 24 hr capsule Commonly known as: CARDIZEM CD TAKE 1 CAPSULE BY MOUTH  DAILY   Eliquis 5 MG Tabs tablet Generic drug: apixaban TAKE 1 TABLET BY MOUTH TWICE  DAILY   ergocalciferol 1.25 MG (50000 UT) capsule Commonly known as: VITAMIN D2 Take 50,000 Units by mouth once a week.   ferrous sulfate 325 (65 FE) MG EC tablet  Take 1 tablet (325 mg total) by mouth 2 (two) times daily. What changed:  when to take this additional instructions   furosemide 40 MG tablet Commonly known as: Lasix Take 1 tablet (40 mg total) by mouth daily.   gabapentin 300 MG capsule Commonly known as: NEURONTIN Take 600 mg by mouth 3 (three) times daily.   HYDROcodone-acetaminophen 5-325 MG tablet Commonly known as: NORCO/VICODIN Take 0.5-1 tablets by mouth 2 (two) times daily.   Jardiance 25 MG Tabs tablet Generic drug:  empagliflozin TAKE 1 TABLET BY MOUTH DAILY  BEFORE BREAKFAST   metFORMIN 500 MG 24 hr tablet Commonly known as: GLUCOPHAGE-XR Take 3 tablets (1,500 mg total) by mouth daily with supper.   methocarbamol 500 MG tablet Commonly known as: ROBAXIN Take 500 mg by mouth 2 (two) times daily.   metoprolol succinate 50 MG 24 hr tablet Commonly known as: TOPROL-XL Take 1 tablet (50 mg total) by mouth daily. Take with or immediately following a meal.   OneTouch Verio IQ System w/Device Kit Use to check blood sugar 2 times per day dx code 250.02   OneTouch Verio test strip Generic drug: glucose blood USE AS DIRECTED TO CHECK  BLOOD SUGAR 2 TIMES DAILY   Ozempic (2 MG/DOSE) 8 MG/3ML Sopn Generic drug: Semaglutide (2 MG/DOSE) INJECT SUBCUTANEOUSLY 2 MG EVERY WEEK   rosuvastatin 10 MG tablet Commonly known as: CRESTOR TAKE 1 TABLET BY MOUTH DAILY        Allergies:  Allergies  Allergen Reactions   Aspirin     Other reaction(s): Unknown   Codeine    Fentanyl Other (See Comments)    hypoxia   Oxycodone Hcl    Penicillins    Chlorhexidine Gluconate Rash    Past Medical History:  Diagnosis Date   Atrial fibrillation (HCC)    Chronic disease anemia 03/29/2011   Diabetes mellitus    Dyslipidemia    Hypertension    Obesities, morbid (HCC)    Osteoarthritis     Past Surgical History:  Procedure Laterality Date   CHOLECYSTECTOMY     REPLACEMENT TOTAL KNEE BILATERAL     SHOULDER SURGERY     RIGHT SHOULDER   TUBAL LIGATION      Family History  Problem Relation Age of Onset   Heart attack Mother    Stroke Mother    Diabetes Mother    Hypertension Brother    Breast cancer Neg Hx     Social History:  reports that she has never smoked. She has never used smokeless tobacco. She reports that she does not drink alcohol and does not use drugs.  Review of Systems:  History of atrial fibrillation: She is on Eliquis, followed by cardiologist  Hypertension:  she has been on  multiple medications for control  She is taking diltiazem, metoprolol and her cardiologist is following blood pressure Previously also on Catapres patch and HCTZ.   Mild CKD: Creatinine is mildly high recently  Microalbumin is normal as of 9/23  BP Readings from Last 3 Encounters:  05/30/22 114/78  04/22/22 (!) 178/101  04/22/22 (!) 176/104   Lab Results  Component Value Date   CREATININE 1.22 (H) 05/26/2022   CREATININE 1.27 (H) 04/22/2022   CREATININE 1.47 (H) 02/03/2022     Lipids: Consistently well controlled with Crestor 10 mg and LDL is as follows:   Lab Results  Component Value Date   CHOL 184 05/26/2022   HDL 81.40 05/26/2022   LDLCALC 89 05/26/2022   TRIG  68.0 05/26/2022   CHOLHDL 2 05/26/2022    History of autonomous multinodular goiter  TSH is  slightly low  She has normal T3 and free T4 levels consistently   Lab Results  Component Value Date   T3FREE 2.7 02/03/2022   T3FREE 3.0 04/27/2021   T3FREE 2.8 09/02/2020   T3FREE 3.5 12/23/2019      Lab Results  Component Value Date   FREET4 1.15 02/03/2022   FREET4 1.04 04/27/2021   FREET4 1.07 09/02/2020   TSH 0.25 (L) 02/03/2022   TSH 0.22 (L) 04/27/2021   TSH 0.19 (L) 09/02/2020       Examination:   BP 114/78   Pulse 71   Ht 5\' 3"  (1.6 m)   Wt 224 lb 9.6 oz (101.9 kg)   SpO2 98%   BMI 39.79 kg/m   Body mass index is 39.79 kg/m.   Thyroid is enlarged about 2-1/2 times normal on the right side, fleshy and smooth, about 1-1/2 times normal on the left and firmer No tremor   ASSESSMENT/ PLAN:   Diabetes type 2, with severe obesity  Current regimen is 2000 mg metformin, Jardiance 25 mg and Ozempic 2 mg weekly  See history of present illness for detailed discussion of current diabetes management, blood sugar patterns and problems identified  Her A1c is 7.4  Her blood sugars are improving with better diet as suggested by dietitian Likely cutting back on juices and  carbohydrates Also renal function relatively stable with Jardiance Likely will be able to continue improved control with better availability of 2 mg dose of Ozempic now  Mild CKD: This is  stable No microalbuminuria  Autonomous thyroid with goiter: Goiter is unchanged on exam Usually has normal thyroid levels with low TSH  HYPERTENSION:  Blood pressure is well controlled with current regimen   LIPIDS: LDL 89 and controlled with 10 mg Crestor  Elayne Snare 05/30/2022, 9:27 AM   Note: This office note was prepared with Dragon voice recognition system technology. Any transcriptional errors that result from this process are unintentional.

## 2022-06-02 ENCOUNTER — Other Ambulatory Visit: Payer: Self-pay | Admitting: Endocrinology

## 2022-06-02 DIAGNOSIS — E1165 Type 2 diabetes mellitus with hyperglycemia: Secondary | ICD-10-CM

## 2022-06-30 ENCOUNTER — Encounter: Payer: Self-pay | Admitting: Dietician

## 2022-06-30 ENCOUNTER — Encounter: Payer: 59 | Attending: Endocrinology | Admitting: Dietician

## 2022-06-30 VITALS — Wt 222.0 lb

## 2022-06-30 DIAGNOSIS — E1122 Type 2 diabetes mellitus with diabetic chronic kidney disease: Secondary | ICD-10-CM | POA: Insufficient documentation

## 2022-06-30 DIAGNOSIS — N183 Chronic kidney disease, stage 3 unspecified: Secondary | ICD-10-CM | POA: Diagnosis present

## 2022-06-30 NOTE — Patient Instructions (Addendum)
Continue the great changes that you have made! Find ways to improve your vegetable intake. Great job drinking more water! Limit/avoid honey or sugar in your tea and coffee Continue to avoid juice Continue to eat consistent meals with vegetables and small amount of protein (egg, chicken, fish, beans) Continue to choose a salad rather than sandwich   Small amounts of exercise each day.  Each little bit adds up. Avoid sitting for more than 1 hour at a time.

## 2022-06-30 NOTE — Progress Notes (Signed)
Diabetes Self-Management Education  Visit Type: Follow-up  Appt. Start Time: 0905 Appt. End Time: 0945  06/30/2022  [name], identified by name and date of birth, is a 72 y.o. female with a diagnosis of Diabetes:  .   ASSESSMENT Patient is here today alone.  She was last seen by myself on 04/06/2022.  Checks her blood glucose twice daily.  Fasting 114 yesterday and 138 before bed. Patient would like a Dexcom.  Noted that patient is not on insulin or had hypoglycemic events and is not eligible for a CGM through Medicare. Swelling in legs noted. 8 lb weight loss in the past 3 months patient states is related to fluid.  History includesType 2 Diabete,s HTN, dyslipidemia, HTN, anemia, arthritis Medications include:  Metformin, Jardiance, ozempic, crestor, elequis, Lasix, iron, calcium, vitamin D, MVI Labs noted:  BUN 18, Creatinine 1.22, Potassium 3.9, GFR 44 on 05/26/2022, A1C 7.4% 05/26/2022 decreased from 8% 02/03/2022   Weight hx: 63" 222 lbs 06/30/22 230 lbs 04/06/2022 240 lbs 02/07/2022 246 lbs  01/27/2022 235 lbs 10/19/2021   Patient lives alone.  She is retired and last worked for [employer]. She uses medical transportation.  Walk with a walker.   Eats only once per day- improved to 2 meals per day.  Likes fruit but doesn't eat fruit.  Avoids beef. Lactose intolerant.  No salt in cooking.  Now avoiding jucie.  Limits bread and eats occasional sandwich. Limits red meat.  Weight 222 lb (100.7 kg). Body mass index is 39.33 kg/m.   Diabetes Self-Management Education - 06/30/22 1003       Visit Information   Visit Type Follow-up      Psychosocial Assessment   Self-care barriers None    Self-management support Doctor's office;CDE visits;Friends    Other persons present Patient    Patient Concerns Nutrition/Meal planning;Glycemic Control;Weight Control    Special Needs None    Preferred Learning Style No preference indicated    Learning Readiness Ready       Pre-Education Assessment   Patient understands the diabetes disease and treatment process. Comprehends key points    Patient understands incorporating nutritional management into lifestyle. Needs Review    Patient undertands incorporating physical activity into lifestyle. Comprehends key points    Patient understands monitoring blood glucose, interpreting and using results Comprehends key points    Patient understands prevention, detection, and treatment of acute complications. Comprehends key points    Patient understands prevention, detection, and treatment of chronic complications. Compreheands key points    Patient understands how to develop strategies to address psychosocial issues. Comprehends key points    Patient understands how to develop strategies to promote health/change behavior. Comprehends key points      Complications   Last HgB A1C per patient/outside source 7.4 %   05/26/2022   How often do you check your blood sugar? 1-2 times/day    Fasting Blood glucose range (mg/dL) 70-129    Postprandial Blood glucose range (mg/dL) 130-179      Dietary Intake   Breakfast 1 sausage link, grits   10 am   Snack (morning) none    Lunch none    Snack (afternoon) none    Dinner salad with [place] and small amount of ranch OR chicken, rice or sweet potato or beans, greens    Snack (evening) none    Beverage(s) water, coffee with 2 tsp sugar or almond milk, occasional tea with honey      Activity / Exercise  Activity / Exercise Type ADL's      Patient Education   Previous Diabetes Education Yes (please comment)   11/23   Healthy Eating Meal options for control of blood glucose level and chronic complications.    Being Active Helped patient identify appropriate exercises in relation to his/her diabetes, diabetes complications and other health issue.    Medications Reviewed patients medication for diabetes, action, purpose, timing of dose and side effects.    Monitoring  Taught/evaluated SMBG meter.;Identified appropriate SMBG and/or A1C goals.    Diabetes Stress and Support Identified and addressed patients feelings and concerns about diabetes      Individualized Goals (developed by patient)   Nutrition General guidelines for healthy choices and portions discussed    Physical Activity Exercise 3-5 times per week;15 minutes per day    Medications take my medication as prescribed    Monitoring  Test my blood glucose as discussed    Problem Solving Eating Pattern;Addressing barriers to behavior change    Reducing Risk treat hypoglycemia with 15 grams of carbs if blood glucose less than '70mg'$ /dL      Patient Self-Evaluation of Goals - Patient rates self as meeting previously set goals (% of time)   Nutrition 50 - 75 % (half of the time)    Physical Activity < 25% (hardly ever/never)    Medications >75% (most of the time)    Monitoring >75% (most of the time)    Problem Solving and behavior change strategies  50 - 75 % (half of the time)    Reducing Risk (treating acute and chronic complications) 50 - 75 % (half of the time)    Health Coping 50 - 75 % (half of the time)      Post-Education Assessment   Patient understands the diabetes disease and treatment process. Demonstrates understanding / competency    Patient understands incorporating nutritional management into lifestyle. Comprehends key points    Patient undertands incorporating physical activity into lifestyle. Comprehends key points    Patient understands using medications safely. Comphrehends key points    Patient understands monitoring blood glucose, interpreting and using results Comprehends key points    Patient understands prevention, detection, and treatment of acute complications. Comprehends key points    Patient understands prevention, detection, and treatment of chronic complications. Comprehends key points    Patient understands how to develop strategies to address psychosocial issues.  Comprehends key points    Patient understands how to develop strategies to promote health/change behavior. Needs Review      Outcomes   Expected Outcomes Demonstrated interest in learning. Expect positive outcomes    Future DMSE 3-4 months    Program Status Not Completed      Subsequent Visit   Since your last visit have you continued or begun to take your medications as prescribed? Yes    Since your last visit have you experienced any weight changes? Loss    Weight Loss (lbs) 8             Individualized Plan for Diabetes Self-Management Training:   Learning Objective:  Patient will have a greater understanding of diabetes self-management. Patient education plan is to attend individual and/or group sessions per assessed needs and concerns.   Plan:   Patient Instructions  Continue the great changes that you have made! Find ways to improve your vegetable intake. Great job drinking more water! Limit/avoid honey or sugar in your tea and coffee Continue to avoid juice Continue to eat consistent  meals with vegetables and small amount of protein (egg, chicken, fish, beans) Continue to choose a salad rather than sandwich   Small amounts of exercise each day.  Each little bit adds up. Avoid sitting for more than 1 hour at a time.    Expected Outcomes:  Demonstrated interest in learning. Expect positive outcomes  Education material provided:   If problems or questions, patient to contact team via:  Phone  Future DSME appointment: 3-4 months

## 2022-07-05 ENCOUNTER — Telehealth: Payer: Self-pay | Admitting: Hematology

## 2022-07-05 NOTE — Telephone Encounter (Signed)
Per Dr Burr Medico being out of office, reached out to reschedule patient. Patient aware of date and time of appointment change.

## 2022-07-13 LAB — HM DIABETES EYE EXAM

## 2022-07-14 ENCOUNTER — Encounter: Payer: Self-pay | Admitting: Endocrinology

## 2022-08-02 ENCOUNTER — Ambulatory Visit: Payer: Medicare Other | Admitting: Hematology

## 2022-08-02 ENCOUNTER — Other Ambulatory Visit: Payer: Medicare Other

## 2022-08-07 NOTE — Progress Notes (Unsigned)
Patient Care Team: Dulce Sellar, MD as PCP - General (General Practice) Nahser, Wonda Cheng, MD as PCP - Cardiology (Cardiology) Elayne Snare, MD as Consulting Physician (Endocrinology)   CHIEF COMPLAINT: Follow-up anemia of chronic disease  Oncology History   No history exists.     CURRENT THERAPY: Aranesp as needed  INTERVAL HISTORY Ms. Urbach returns for follow-up, last seen by me 10/31/2021.  CBC 04/22/2022 showed normal hemoglobin.   ROS   Past Medical History:  Diagnosis Date   Atrial fibrillation ([place])    Chronic disease anemia 03/29/2011   Diabetes mellitus    Dyslipidemia    Hypertension    Obesities, morbid ([place])    Osteoarthritis      Past Surgical History:  Procedure Laterality Date   CHOLECYSTECTOMY     REPLACEMENT TOTAL KNEE BILATERAL     SHOULDER SURGERY     RIGHT SHOULDER   TUBAL LIGATION       Outpatient Encounter Medications as of 08/08/2022  Medication Sig   benazepril (LOTENSIN) 10 MG tablet Take 1 tablet (10 mg total) by mouth daily.   Blood Glucose Monitoring Suppl (ONETOUCH VERIO IQ SYSTEM) W/DEVICE KIT Use to check blood sugar 2 times per day dx code 250.02   CALCIUM PO Take by mouth 2 (two) times daily.   diltiazem (CARDIZEM CD) 360 MG 24 hr capsule TAKE 1 CAPSULE BY MOUTH  DAILY   ELIQUIS 5 MG TABS tablet TAKE 1 TABLET BY MOUTH TWICE  DAILY   ergocalciferol (VITAMIN D2) 50000 UNITS capsule Take 50,000 Units by mouth once a week.   ferrous sulfate 325 (65 FE) MG EC tablet Take 1 tablet (325 mg total) by mouth 2 (two) times daily. (Patient taking differently: Take 325 mg by mouth daily with breakfast. Taking 325mg  once daily)   furosemide (LASIX) 40 MG tablet Take 1 tablet (40 mg total) by mouth daily.   gabapentin (NEURONTIN) 300 MG capsule Take 600 mg by mouth 3 (three) times daily.   HYDROcodone-acetaminophen (NORCO/VICODIN) 5-325 MG tablet Take 0.5-1 tablets by mouth 2 (two) times daily.   JARDIANCE 25 MG TABS tablet TAKE 1 TABLET BY MOUTH  DAILY  BEFORE BREAKFAST   metFORMIN (GLUCOPHAGE-XR) 500 MG 24 hr tablet TAKE 4 TABLETS BY MOUTH  DAILY WITH SUPPER   methocarbamol (ROBAXIN) 500 MG tablet Take 500 mg by mouth 2 (two) times daily.   metoprolol succinate (TOPROL-XL) 50 MG 24 hr tablet Take 1 tablet (50 mg total) by mouth daily. Take with or immediately following a meal.   ONETOUCH VERIO test strip USE AS DIRECTED TO CHECK  BLOOD SUGAR TWICE DAILY   rosuvastatin (CRESTOR) 10 MG tablet TAKE 1 TABLET BY MOUTH DAILY   Semaglutide, 2 MG/DOSE, (OZEMPIC, 2 MG/DOSE,) 8 MG/3ML SOPN INJECT SUBCUTANEOUSLY 2 MG EVERY WEEK   No facility-administered encounter medications on file as of 08/08/2022.     There were no vitals filed for this visit. There is no height or weight on file to calculate BMI.   PHYSICAL EXAM GENERAL:alert, no distress and comfortable SKIN: no rash  EYES: sclera clear NECK: without mass LYMPH:  no palpable cervical or supraclavicular lymphadenopathy  LUNGS: clear with normal breathing effort HEART: regular rate & rhythm, no lower extremity edema ABDOMEN: abdomen soft, non-tender and normal bowel sounds NEURO: alert & oriented x 3 with fluent speech, no focal motor/sensory deficits Breast exam:  PAC without erythema    CBC    Component Value Date/Time   WBC 6.1  04/22/2022 1202   RBC 4.66 04/22/2022 1202   HGB 13.6 04/22/2022 1202   HGB 10.9 (L) 03/02/2021 0815   HGB 12.0 02/16/2017 0802   HCT 42.2 04/22/2022 1202   HCT 35.7 03/02/2021 0815   HCT 36.8 02/16/2017 0802   PLT 242 04/22/2022 1202   PLT 214 03/02/2021 0815   PLT 241 02/16/2017 0802   MCV 90.6 04/22/2022 1202   MCV 89.3 02/16/2017 0802   MCH 29.2 04/22/2022 1202   MCHC 32.2 04/22/2022 1202   RDW 15.8 (H) 04/22/2022 1202   RDW 15.6 (H) 02/16/2017 0802   LYMPHSABS 1.7 04/22/2022 1202   LYMPHSABS 1.9 02/16/2017 0802   MONOABS 0.4 04/22/2022 1202   MONOABS 0.2 02/16/2017 0802   EOSABS 0.1 04/22/2022 1202   EOSABS 0.1 02/16/2017 0802    EOSABS 0.2 02/05/2006 0932   BASOSABS 0.0 04/22/2022 1202   BASOSABS 0.0 02/16/2017 0802     CMP     Component Value Date/Time   NA 145 05/26/2022 0847   NA 143 02/16/2017 0802   K 3.9 05/26/2022 0847   K 3.5 02/16/2017 0802   CL 105 05/26/2022 0847   CO2 27 05/26/2022 0847   CO2 26 02/16/2017 0802   GLUCOSE 132 (H) 05/26/2022 0847   GLUCOSE 92 02/16/2017 0802   BUN 18 05/26/2022 0847   BUN 17.2 02/16/2017 0802   CREATININE 1.22 (H) 05/26/2022 0847   CREATININE 0.9 02/16/2017 0802   CALCIUM 10.7 (H) 05/26/2022 0847   CALCIUM 9.9 02/16/2017 0802   PROT 8.7 (H) 05/26/2022 0847   PROT 7.5 02/16/2017 0802   ALBUMIN 4.4 05/26/2022 0847   ALBUMIN 3.9 02/16/2017 0802   AST 32 05/26/2022 0847   AST 26 02/16/2017 0802   ALT 25 05/26/2022 0847   ALT 18 02/16/2017 0802   ALKPHOS 106 05/26/2022 0847   ALKPHOS 64 02/16/2017 0802   BILITOT 0.7 05/26/2022 0847   BILITOT 0.58 02/16/2017 0802   GFRNONAA 45 (L) 04/22/2022 1355   GFRAA >60 05/17/2018 0807     ASSESSMENT & PLAN: 72 year old female   1. Anemia of chronic disease and h/o IDA -Initial work-up showed normal ferritin and serum iron level, slightly low iron saturation, normal 123456 and folic acid.  Normal EPO, SPEP negative -Given her multiple comorbidities especially diabetes this was felt to be anemia of chronic disease -Given no other cytopenias, primary bone marrow disorder was felt to be less likely, a bone marrow biopsy has not been done -She responded well to Aranesp and Feraheme in the past, last given 09/2017.  Hg normalized -she has recurrent mild anemia hgb 10.9 - 11 11/2020 - 04/2021 in the setting of PNA and worsening CKD.  -anemia resolved last year, she is on q3 month labs observation   2.  CKD -Intermittent since at least 2019, Scr up to 1.59 in 08/2020 -Denies NSAID use, meds reviewed, she is on multidrug regimen for HTN, A. fib, DM including diuretics and anticoagulation which can cause renal dysfunction.    -Hemoglobin A1c 6-7.5 since 2019 -SCR fluctuates   3. 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 Adine Madura ([place]) -chronic L>R leg swelling -ambulates with a cane -followed at [facility] by Dr. Jackqulyn Livings   4.  Health promotion and disease prevention -She follows up regularly with PCP, cardiology, endocrine, and oncology at [facility] for GCT -Last mammogram 05/05/2021 was benign -per patient she had colonoscopy at [facility] 02/28/21, I requested but did  not receive the report  -her activity is limited due to leg pain and co-morbidities, I encouraged her to safely mobilize  PLAN:  No orders of the defined types were placed in this encounter.     All questions were answered. The patient knows to call the clinic with any problems, questions or concerns. No barriers to learning were detected. I spent *** counseling the patient face to face. The total time spent in the appointment was *** and more than 50% was on counseling, review of test results, and coordination of care.   Cira Rue, NP-C @DATE @

## 2022-08-08 ENCOUNTER — Encounter: Payer: Self-pay | Admitting: Nurse Practitioner

## 2022-08-08 ENCOUNTER — Inpatient Hospital Stay: Payer: 59 | Attending: Nurse Practitioner

## 2022-08-08 ENCOUNTER — Other Ambulatory Visit: Payer: Self-pay

## 2022-08-08 ENCOUNTER — Inpatient Hospital Stay (HOSPITAL_BASED_OUTPATIENT_CLINIC_OR_DEPARTMENT_OTHER): Payer: 59 | Admitting: Nurse Practitioner

## 2022-08-08 VITALS — BP 147/78 | HR 72 | Temp 98.3°F | Resp 13 | Wt 219.3 lb

## 2022-08-08 DIAGNOSIS — D631 Anemia in chronic kidney disease: Secondary | ICD-10-CM | POA: Diagnosis present

## 2022-08-08 DIAGNOSIS — D509 Iron deficiency anemia, unspecified: Secondary | ICD-10-CM | POA: Diagnosis not present

## 2022-08-08 DIAGNOSIS — E1122 Type 2 diabetes mellitus with diabetic chronic kidney disease: Secondary | ICD-10-CM | POA: Diagnosis not present

## 2022-08-08 DIAGNOSIS — N189 Chronic kidney disease, unspecified: Secondary | ICD-10-CM | POA: Insufficient documentation

## 2022-08-08 DIAGNOSIS — D638 Anemia in other chronic diseases classified elsewhere: Secondary | ICD-10-CM | POA: Diagnosis not present

## 2022-08-08 DIAGNOSIS — E785 Hyperlipidemia, unspecified: Secondary | ICD-10-CM | POA: Diagnosis not present

## 2022-08-08 DIAGNOSIS — Z79899 Other long term (current) drug therapy: Secondary | ICD-10-CM | POA: Diagnosis not present

## 2022-08-08 DIAGNOSIS — I129 Hypertensive chronic kidney disease with stage 1 through stage 4 chronic kidney disease, or unspecified chronic kidney disease: Secondary | ICD-10-CM | POA: Insufficient documentation

## 2022-08-08 DIAGNOSIS — I4891 Unspecified atrial fibrillation: Secondary | ICD-10-CM | POA: Insufficient documentation

## 2022-08-08 DIAGNOSIS — Z7901 Long term (current) use of anticoagulants: Secondary | ICD-10-CM | POA: Diagnosis not present

## 2022-08-08 DIAGNOSIS — Z7984 Long term (current) use of oral hypoglycemic drugs: Secondary | ICD-10-CM | POA: Diagnosis not present

## 2022-08-08 LAB — CMP (CANCER CENTER ONLY)
ALT: 17 U/L (ref 0–44)
AST: 21 U/L (ref 15–41)
Albumin: 4.1 g/dL (ref 3.5–5.0)
Alkaline Phosphatase: 122 U/L (ref 38–126)
Anion gap: 9 (ref 5–15)
BUN: 36 mg/dL — ABNORMAL HIGH (ref 8–23)
CO2: 27 mmol/L (ref 22–32)
Calcium: 10.2 mg/dL (ref 8.9–10.3)
Chloride: 106 mmol/L (ref 98–111)
Creatinine: 1.56 mg/dL — ABNORMAL HIGH (ref 0.44–1.00)
GFR, Estimated: 35 mL/min — ABNORMAL LOW (ref >60)
Glucose, Bld: 131 mg/dL — ABNORMAL HIGH (ref 70–99)
Potassium: 3.9 mmol/L (ref 3.5–5.1)
Sodium: 142 mmol/L (ref 135–145)
Total Bilirubin: 0.5 mg/dL (ref 0.3–1.2)
Total Protein: 8.4 g/dL — ABNORMAL HIGH (ref 6.5–8.1)

## 2022-08-08 LAB — CBC WITH DIFFERENTIAL (CANCER CENTER ONLY)
Abs Immature Granulocytes: 0.01 10*3/uL (ref 0.00–0.07)
Basophils Absolute: 0 10*3/uL (ref 0.0–0.1)
Basophils Relative: 1 %
Eosinophils Absolute: 0.1 10*3/uL (ref 0.0–0.5)
Eosinophils Relative: 2 %
HCT: 40.9 % (ref 36.0–46.0)
Hemoglobin: 13.5 g/dL (ref 12.0–15.0)
Immature Granulocytes: 0 %
Lymphocytes Relative: 24 %
Lymphs Abs: 1.3 10*3/uL (ref 0.7–4.0)
MCH: 29 pg (ref 26.0–34.0)
MCHC: 33 g/dL (ref 30.0–36.0)
MCV: 87.8 fL (ref 80.0–100.0)
Monocytes Absolute: 0.3 10*3/uL (ref 0.1–1.0)
Monocytes Relative: 5 %
Neutro Abs: 3.6 10*3/uL (ref 1.7–7.7)
Neutrophils Relative %: 68 %
Platelet Count: 231 10*3/uL (ref 150–400)
RBC: 4.66 MIL/uL (ref 3.87–5.11)
RDW: 15.9 % — ABNORMAL HIGH (ref 11.5–15.5)
WBC Count: 5.3 10*3/uL (ref 4.0–10.5)
nRBC: 0 % (ref 0.0–0.2)

## 2022-08-08 LAB — IRON AND IRON BINDING CAPACITY (CC-WL,HP ONLY)
Iron: 73 ug/dL (ref 28–170)
Saturation Ratios: 22 % (ref 10.4–31.8)
TIBC: 333 ug/dL (ref 250–450)
UIBC: 260 ug/dL (ref 148–442)

## 2022-08-08 LAB — FERRITIN: Ferritin: 111 ng/mL (ref 11–307)

## 2022-08-09 ENCOUNTER — Telehealth: Payer: Self-pay

## 2022-08-09 ENCOUNTER — Other Ambulatory Visit: Payer: Self-pay | Admitting: Endocrinology

## 2022-08-09 DIAGNOSIS — D508 Other iron deficiency anemias: Secondary | ICD-10-CM

## 2022-08-09 NOTE — Telephone Encounter (Addendum)
Called patient and relayed message below as per Dr. Burr Medico, patient voiced full understanding.   ----- Message from Alla Feeling, NP sent at 08/09/2022 10:40 AM EDT ----- Please let patient know she can reduce oral iron to 1 tab daily (from BID).   Thanks, Regan Rakers NP

## 2022-08-21 ENCOUNTER — Other Ambulatory Visit: Payer: Self-pay | Admitting: Adult Medicine

## 2022-08-21 DIAGNOSIS — Z1231 Encounter for screening mammogram for malignant neoplasm of breast: Secondary | ICD-10-CM

## 2022-08-22 ENCOUNTER — Ambulatory Visit
Admission: RE | Admit: 2022-08-22 | Discharge: 2022-08-22 | Disposition: A | Discharge status: Home / Self Care | Payer: 59 | Source: Ambulatory Visit | Attending: Adult Medicine | Admitting: Adult Medicine

## 2022-08-22 DIAGNOSIS — Z1231 Encounter for screening mammogram for malignant neoplasm of breast: Secondary | ICD-10-CM

## 2022-09-25 ENCOUNTER — Other Ambulatory Visit: Payer: 59

## 2022-09-27 ENCOUNTER — Other Ambulatory Visit: Payer: 59

## 2022-09-27 NOTE — Progress Notes (Unsigned)
Patient ID: MONIKA BORROWMAN, female   DOB: Jan 17, 1951, 72 y.o.   MRN: [id number]    Reason for Appointment: Follow-up  History of Present Illness   Diagnosis: Type 2 DIABETES MELITUS, date of diagnosis:  2000     Previous history: She has been on various regimens of treatment in the past including Byetta and generally has done better with GLP-1 drugs She continues to take low-dose glimepiride which helps with overall control. Usually blood sugars are not variable Because of occasional hypoglycemia her Amaryl was stopped in 03/2014 Was on Victoza since 2013 until 2019  Recent history: Non-insulin hypoglycemic drugs:  Jardiance 25 mg daily, metformin ER 500 mg, 4 tablets at dinner  Ozempic 2 mg weekly  Current blood sugar patterns and problems identified:  Her A1c is at 7.4  A1c is again high as on the last visit but her home blood sugars are fairly good She has lowered checked her sugars as frequently and none after dinner lately She says she is trying to do a little walking Weight is slightly better Has been generally doing better with diet after seeing the dietitian in 2023 Has been taking her Ozempic consistently every week  Dinner usually at 5-6 pm     Side effects from medications: None  Monitors blood glucose: At least once a day.    Glucometer:  One Touch    PRE-MEAL Fasting Lunch Dinner Bedtime Overall  Glucose range: 121  101-167  101-167  Mean/median:  127   126   POST-MEAL PC Breakfast PC Lunch PC Dinner  Glucose range:   ?  Mean/median:      Prior   PRE-MEAL Fasting Lunch Dinner Bedtime Overall  Glucose range: 123-151    95-179  Mean/median: 136  125  130   POST-MEAL PC Breakfast PC Lunch PC Dinner  Glucose range:   109-179  Mean/median:   140     Wt Readings from Last 3 Encounters:  09/28/22 218 lb (98.9 kg)  08/08/22 219 lb 4.8 oz (99.5 kg)  06/30/22 222 lb (100.7 kg)   Lab Results  Component Value Date   HGBA1C 7.4 (A) 09/28/2022    HGBA1C 7.4 (H) 05/26/2022   HGBA1C 8.0 (H) 02/03/2022   Lab Results  Component Value Date   MICROALBUR 9.4 (H) 02/03/2022   LDLCALC 89 05/26/2022   CREATININE 1.56 (H) 08/08/2022   Other problems discussed today: See review of systems    Office Visit on 09/28/2022  Component Date Value Ref Range Status   Hemoglobin A1C 09/28/2022 7.4 (A)  4.0 - 5.6 % Final    Allergies as of 09/28/2022       Reactions   Aspirin    Other reaction(s): Unknown   Codeine    Fentanyl Other (See Comments)   hypoxia   Oxycodone Hcl    Penicillins    Chlorhexidine Gluconate Rash        Medication List        Accurate as of Sep 28, 2022  9:31 AM. If you have any questions, ask your nurse or doctor.          benazepril 10 MG tablet Commonly known as: LOTENSIN Take 1 tablet (10 mg total) by mouth daily.   CALCIUM PO Take by mouth 2 (two) times daily.   diltiazem 360 MG 24 hr capsule Commonly known as: CARDIZEM CD TAKE 1 CAPSULE BY MOUTH  DAILY   Eliquis 5  MG Tabs tablet Generic drug: apixaban TAKE 1 TABLET BY MOUTH TWICE  DAILY   ergocalciferol 1.25 MG (50000 UT) capsule Commonly known as: VITAMIN D2 Take 50,000 Units by mouth once a week.   ferrous sulfate 325 (65 FE) MG EC tablet Take 1 tablet (325 mg total) by mouth 2 (two) times daily. What changed:  when to take this additional instructions   furosemide 40 MG tablet Commonly known as: Lasix Take 1 tablet (40 mg total) by mouth daily.   gabapentin 300 MG capsule Commonly known as: NEURONTIN Take 600 mg by mouth 3 (three) times daily.   HYDROcodone-acetaminophen 5-325 MG tablet Commonly known as: NORCO/VICODIN Take 0.5-1 tablets by mouth 2 (two) times daily.   Jardiance 25 MG Tabs tablet Generic drug: empagliflozin TAKE 1 TABLET BY MOUTH DAILY  BEFORE BREAKFAST   metFORMIN 500 MG 24 hr tablet Commonly known as: GLUCOPHAGE-XR TAKE 4 TABLETS BY MOUTH  DAILY WITH SUPPER   methocarbamol 500 MG  tablet Commonly known as: ROBAXIN Take 500 mg by mouth 2 (two) times daily.   metoprolol succinate 50 MG 24 hr tablet Commonly known as: TOPROL-XL Take 1 tablet (50 mg total) by mouth daily. Take with or immediately following a meal.   OneTouch Verio IQ System w/Device Kit Use to check blood sugar 2 times per day dx code 250.02   OneTouch Verio test strip Generic drug: glucose blood USE AS DIRECTED TO CHECK  BLOOD SUGAR TWICE DAILY   Ozempic (2 MG/DOSE) 8 MG/3ML Sopn Generic drug: Semaglutide (2 MG/DOSE) INJECT SUBCUTANEOUSLY 2 MG EVERY WEEK   rosuvastatin 10 MG tablet Commonly known as: CRESTOR TAKE 1 TABLET BY MOUTH DAILY        Allergies:  Allergies  Allergen Reactions   Aspirin     Other reaction(s): Unknown   Codeine    Fentanyl Other (See Comments)    hypoxia   Oxycodone Hcl    Penicillins    Chlorhexidine Gluconate Rash    Past Medical History:  Diagnosis Date   Atrial fibrillation ([facility])    Chronic disease anemia 03/29/2011   Diabetes mellitus    Dyslipidemia    Hypertension    Obesities, morbid ([facility])    Osteoarthritis     Past Surgical History:  Procedure Laterality Date   CHOLECYSTECTOMY     REPLACEMENT TOTAL KNEE BILATERAL     SHOULDER SURGERY     RIGHT SHOULDER   TUBAL LIGATION      Family History  Problem Relation Age of Onset   Heart attack Mother    Stroke Mother    Diabetes Mother    Hypertension Brother    Breast cancer Neg Hx     Social History:  reports that she has never smoked. She has never used smokeless tobacco. She reports that she does not drink alcohol and does not use drugs.  Review of Systems:  History of atrial fibrillation: She is on Eliquis, followed by cardiologist  Hypertension:  she has been on multiple medications for control  She is taking diltiazem, metoprolol and her cardiologist is following blood pressure Previously also on Catapres patch and HCTZ.  Mild CKD: Has variable creatinine  Microalbumin  is normal as of 9/23  BP Readings from Last 3 Encounters:  09/28/22 (!) 148/84  08/08/22 (!) 147/78  05/30/22 114/78   Lab Results  Component Value Date   CREATININE 1.56 (H) 08/08/2022   CREATININE 1.22 (H) 05/26/2022   CREATININE 1.27 (H) 04/22/2022  Lipids: Consistently well controlled with Crestor 10 mg and LDL is as follows:   Lab Results  Component Value Date   CHOL 184 05/26/2022   HDL 81.40 05/26/2022   LDLCALC 89 05/26/2022   TRIG 68.0 05/26/2022   CHOLHDL 2 05/26/2022    History of autonomous multinodular goiter  TSH is generally slightly low  She has normal T3 and free T4 levels consistently   Lab Results  Component Value Date   T3FREE 2.7 02/03/2022   T3FREE 3.0 04/27/2021   T3FREE 2.8 09/02/2020   T3FREE 3.5 12/23/2019      Lab Results  Component Value Date   FREET4 1.15 02/03/2022   FREET4 1.04 04/27/2021   FREET4 1.07 09/02/2020   TSH 0.25 (L) 02/03/2022   TSH 0.22 (L) 04/27/2021   TSH 0.19 (L) 09/02/2020       Examination:   BP (!) 148/84   Pulse 86   Ht 5\' 3"  (1.6 m)   Wt 218 lb (98.9 kg)   SpO2 99%   BMI 38.62 kg/m   Body mass index is 38.62 kg/m.    Diabetic Foot Exam - Simple   Simple Foot Form Diabetic Foot exam was performed with the following findings: Yes   Visual Inspection No deformities, no ulcerations, no other skin breakdown bilaterally: Yes Sensation Testing Intact to touch and monofilament testing bilaterally: Yes Pulse Check Posterior Tibialis and Dorsalis pulse intact bilaterally: Yes Comments     ASSESSMENT/ PLAN: 5/24  Diabetes type 2, with obesity  Current regimen is 2000 mg metformin, Jardiance 25 mg and Ozempic 2 mg weekly  See history of present illness for detailed discussion of current diabetes management, blood sugar patterns and problems identified  Her A1c is 7.4 again  A1c appears to be higher than predicted by her blood sugars at home Her blood sugars are fairly stable although  not clear if she has any high readings after dinner which she does not monitor Weight is slowly coming down She has done better with her diet as recommended by dietitian previously No change in medication required  Mild CKD: Have labs checked today  Autonomous thyroid with goiter: Needs follow-up labs today, previously exams have not shown any change in the size of the thyroid  HYPERTENSION:   Blood pressure is well controlled with current regimen, better on second measurement    Reather Littler 09/28/2022, 9:31 AM   Note: This office note was prepared with Dragon voice recognition system technology. Any transcriptional errors that result from this process are unintentional.

## 2022-09-28 ENCOUNTER — Ambulatory Visit (INDEPENDENT_AMBULATORY_CARE_PROVIDER_SITE_OTHER): Payer: 59 | Admitting: Endocrinology

## 2022-09-28 ENCOUNTER — Encounter: Payer: Self-pay | Admitting: Endocrinology

## 2022-09-28 VITALS — BP 110/80 | HR 86 | Ht 63.0 in | Wt 218.0 lb

## 2022-09-28 DIAGNOSIS — R946 Abnormal results of thyroid function studies: Secondary | ICD-10-CM

## 2022-09-28 DIAGNOSIS — Z7985 Long-term (current) use of injectable non-insulin antidiabetic drugs: Secondary | ICD-10-CM

## 2022-09-28 DIAGNOSIS — Z7984 Long term (current) use of oral hypoglycemic drugs: Secondary | ICD-10-CM

## 2022-09-28 DIAGNOSIS — I10 Essential (primary) hypertension: Secondary | ICD-10-CM | POA: Diagnosis not present

## 2022-09-28 DIAGNOSIS — E1165 Type 2 diabetes mellitus with hyperglycemia: Secondary | ICD-10-CM

## 2022-09-28 LAB — POCT GLYCOSYLATED HEMOGLOBIN (HGB A1C): Hemoglobin A1C: 7.4 % — AB (ref 4.0–5.6)

## 2022-09-28 NOTE — Patient Instructions (Signed)
Check blood sugars on waking up 2-3 days a week  Also check blood sugars about 2 hours after meals and do this after different meals by rotation  Recommended blood sugar levels on waking up are 90-130 and about 2 hours after meal is 130-160  Please bring your blood sugar monitor to each visit, thank you   

## 2022-10-22 ENCOUNTER — Other Ambulatory Visit: Payer: Self-pay | Admitting: Endocrinology

## 2022-10-26 ENCOUNTER — Ambulatory Visit: Payer: 59 | Admitting: Dietician

## 2022-11-05 ENCOUNTER — Other Ambulatory Visit: Payer: Self-pay | Admitting: Endocrinology

## 2023-01-16 ENCOUNTER — Ambulatory Visit (INDEPENDENT_AMBULATORY_CARE_PROVIDER_SITE_OTHER): Payer: 59 | Admitting: Endocrinology

## 2023-01-16 ENCOUNTER — Encounter: Payer: Self-pay | Admitting: Endocrinology

## 2023-01-16 VITALS — BP 130/90 | HR 85 | Ht 63.0 in | Wt 216.2 lb

## 2023-01-16 DIAGNOSIS — E042 Nontoxic multinodular goiter: Secondary | ICD-10-CM

## 2023-01-16 DIAGNOSIS — E11319 Type 2 diabetes mellitus with unspecified diabetic retinopathy without macular edema: Secondary | ICD-10-CM | POA: Diagnosis not present

## 2023-01-16 DIAGNOSIS — N1832 Chronic kidney disease, stage 3b: Secondary | ICD-10-CM

## 2023-01-16 DIAGNOSIS — E1122 Type 2 diabetes mellitus with diabetic chronic kidney disease: Secondary | ICD-10-CM

## 2023-01-16 DIAGNOSIS — E0590 Thyrotoxicosis, unspecified without thyrotoxic crisis or storm: Secondary | ICD-10-CM

## 2023-01-16 DIAGNOSIS — Z7985 Long-term (current) use of injectable non-insulin antidiabetic drugs: Secondary | ICD-10-CM

## 2023-01-16 DIAGNOSIS — Z7984 Long term (current) use of oral hypoglycemic drugs: Secondary | ICD-10-CM

## 2023-01-16 LAB — POCT GLYCOSYLATED HEMOGLOBIN (HGB A1C): Hemoglobin A1C: 6.8 % — AB (ref 4.0–5.6)

## 2023-01-16 LAB — MICROALBUMIN / CREATININE URINE RATIO
Creatinine,U: 105.4 mg/dL
Microalb Creat Ratio: 18.4 mg/g (ref 0.0–30.0)
Microalb, Ur: 19.4 mg/dL — ABNORMAL HIGH (ref 0.0–1.9)

## 2023-01-16 LAB — BASIC METABOLIC PANEL
BUN: 29 mg/dL — ABNORMAL HIGH (ref 6–23)
CO2: 27 meq/L (ref 19–32)
Calcium: 9.9 mg/dL (ref 8.4–10.5)
Chloride: 106 meq/L (ref 96–112)
Creatinine, Ser: 1.13 mg/dL (ref 0.40–1.20)
GFR: 48.83 mL/min — ABNORMAL LOW (ref >60.00)
Glucose, Bld: 109 mg/dL — ABNORMAL HIGH (ref 70–99)
Potassium: 3.9 meq/L (ref 3.5–5.1)
Sodium: 141 meq/L (ref 135–145)

## 2023-01-16 LAB — T4, FREE: Free T4: 1.22 ng/dL (ref 0.60–1.60)

## 2023-01-16 LAB — T3, FREE: T3, Free: 3.3 pg/mL (ref 2.3–4.2)

## 2023-01-16 LAB — TSH: TSH: 0.31 u[IU]/mL — ABNORMAL LOW (ref 0.35–5.50)

## 2023-01-16 MED ORDER — METFORMIN HCL ER 500 MG PO TB24: 500.0000 mg | ORAL_TABLET | Freq: Two times a day (BID) | ORAL | 3 refills | Status: DC

## 2023-01-16 NOTE — Patient Instructions (Addendum)
Diabetes regimen: Ozempic 2 mg weekly. Jardiance 25 mg daily. Decrease metformin extended release 500 mg 1 tablet 2 times a day.  Will refer to nephrology for your kidneys.  Will decide on the thyroid medication based on thyroid lab today.

## 2023-01-16 NOTE — Progress Notes (Signed)
Outpatient Endocrinology Note [place] Jocob Dambach, MD  01/16/23  Patient's Name: AMI Fischer    DOB: 04/21/1951    MRN: [id number]                                                    REASON OF VISIT: Follow up of type 2 diabetes mellitus  PCP: Eliezer Lofts, MD  HISTORY OF PRESENT ILLNESS:   ARPI FILARDI is a 72 y.o. old female with past medical history listed below, is here for follow up of type 2 diabetes mellitus.   Pertinent Diabetes History: Patient was diagnosed with type 2 diabetes mellitus in 2000.  She had been on various regimen of diabetes treatment in the past including Byetta and generally had done better with GLP-1 receptor agonist medications.  She used to be on low-dose of glimepiride which was stopped due to hypoglycemia in November 2015.  She had been on Victoza since 2013 until 2019.  Chronic Diabetes Complications : Retinopathy: left yes. Last ophthalmology exam was done on annually.  Nephropathy: CKD present, on benzepril Peripheral neuropathy: no Coronary artery disease: no Stroke: no  Relevant comorbidities and cardiovascular risk factors: Obesity: yes Body mass index is 38.3 kg/m.  Hypertension: yes, following with cardiology. Hyperlipidemia. Yes, on a statin.  Current / Home Diabetic regimen includes: Jardiance 25 mg daily. Metformin extended release 1000 mg two times a day.  Ozempic 2 mg weekly.  Prior diabetic medications: Victoza Amaryl/glimepiride is stopped due to hypoglycemia in 2015.  Glycemic data:   No glucometer, forget to bring.  She recalls blood sugar mostly in the normal range in low 100 and lowest 99 and highest 175.  Hypoglycemia: Patient has no hypoglycemic episodes. Patient has hypoglycemia awareness.  Factors modifying glucose control: 1.  Diabetic diet assessment: 3 meals a day.  2.  Staying active or exercising: No formal exercise  3.  Medication compliance: compliant all of the time.  # Multiple thyroid nodules  /multinodular goiter /subclinical hyperthyroidism: Patient has longstanding history of multinodular goiter from 2006, per office note.  She has mildly low TSH frequently with normal free T4 and free T3 for several years. -RAI uptake and scan in June 27, 2004 : There was slightly enlarged thyroid with area of relatively low uptake in the somewhat enlarged area of the right mid lobe of the thyroid.  Uptake was in the lower normal range. -Ultrasound thyroid in October 20, 2013 showed a dominant 5.1 cm heterogeneous nodule is noted in the midportion of the right lobe of the thyroid, this was corresponding to the relative photopenic area in the right lobe seen on the prior nuclear exam from 2006.  Multiple smaller less than 1 cm nodules are noted scattered throughout the right lobe of the thyroid.  A 3.3 cm nodule was noted in the midportion of the left lobe of the thyroid. -Patient had FNA of bilateral dominant thyroid nodules in November 13, 2013, cytology benign consistent with nonneoplastic goiter. -Patient has no follow-up ultrasound after 2015.  # She has history of atrial fibrillation currently on Eliquis, following with cardiology.  Interval history 01/16/23 Patient forgot to bring the glucometer in the clinic today.  She reports compliance with diabetic medication.  Denies hypoglycemia.  She is tolerating Ozempic well.  She is taking Ozempic 2 mg weekly and metformin 2  tablets with breakfast and lunch.  She denies palpitation or heat intolerance.  No neck compressive symptoms, dysphagia or shortness of breath in any position.  No other complaints today. She has hemoglobin A1c 6.8% today.  REVIEW OF SYSTEMS As per history of present illness.   PAST MEDICAL HISTORY: Past Medical History:  Diagnosis Date   Atrial fibrillation (HCC)    Chronic disease anemia 03/29/2011   Diabetes mellitus    Dyslipidemia    Hypertension    Obesities, morbid (HCC)    Osteoarthritis     PAST SURGICAL  HISTORY: Past Surgical History:  Procedure Laterality Date   CHOLECYSTECTOMY     REPLACEMENT TOTAL KNEE BILATERAL     SHOULDER SURGERY     RIGHT SHOULDER   TUBAL LIGATION      ALLERGIES: Allergies  Allergen Reactions   Aspirin     Other reaction(s): Unknown   Codeine    Fentanyl Other (See Comments)    hypoxia   Oxycodone Hcl    Penicillins    Chlorhexidine Gluconate Rash    FAMILY HISTORY:  Family History  Problem Relation Age of Onset   Heart attack Mother    Stroke Mother    Diabetes Mother    Hypertension Brother    Breast cancer Neg Hx     SOCIAL HISTORY: Social History   Socioeconomic History   Marital status: Married    Spouse name: Not on file   Number of children: Not on file   Years of education: Not on file   Highest education level: Not on file  Occupational History   Not on file  Tobacco Use   Smoking status: Never   Smokeless tobacco: Never  Vaping Use   Vaping status: Never Used  Substance and Sexual Activity   Alcohol use: No   Drug use: No   Sexual activity: Not Currently  Other Topics Concern   Not on file  Social History Narrative   Not on file   Social Determinants of Health   Financial Resource Strain: Not on file  Food Insecurity: Not on file  Transportation Needs: Not on file  Physical Activity: Not on file  Stress: Not on file  Social Connections: Unknown (11/15/2022)   Received from [employer]   Social Network    Social Network: Not on file    MEDICATIONS:  Current Outpatient Medications  Medication Sig Dispense Refill   benazepril (LOTENSIN) 10 MG tablet Take 1 tablet (10 mg total) by mouth daily. 30 tablet 11   Blood Glucose Monitoring Suppl (ONETOUCH VERIO IQ SYSTEM) W/DEVICE KIT Use to check blood sugar 2 times per day dx code 250.02 1 kit 0   CALCIUM PO Take by mouth 2 (two) times daily.     diltiazem (CARDIZEM CD) 360 MG 24 hr capsule TAKE 1 CAPSULE BY MOUTH DAILY 100 capsule 2   ELIQUIS 5 MG TABS tablet  TAKE 1 TABLET BY MOUTH TWICE  DAILY 200 tablet 2   ergocalciferol (VITAMIN D2) 50000 UNITS capsule Take 50,000 Units by mouth once a week.     ferrous sulfate 325 (65 FE) MG EC tablet Take 1 tablet (325 mg total) by mouth 2 (two) times daily. (Patient taking differently: Take 325 mg by mouth daily with breakfast. Taking 325mg  once daily) 60 tablet 3   furosemide (LASIX) 40 MG tablet Take 1 tablet (40 mg total) by mouth daily. 30 tablet 2   gabapentin (NEURONTIN) 300 MG capsule Take 600 mg by mouth 3 (  three) times daily.     HYDROcodone-acetaminophen (NORCO/VICODIN) 5-325 MG tablet Take 0.5-1 tablets by mouth 2 (two) times daily.     JARDIANCE 25 MG TABS tablet TAKE 1 TABLET BY MOUTH DAILY  BEFORE BREAKFAST 100 tablet 2   methocarbamol (ROBAXIN) 500 MG tablet Take 500 mg by mouth 2 (two) times daily.     metoprolol succinate (TOPROL-XL) 50 MG 24 hr tablet Take 1 tablet (50 mg total) by mouth daily. Take with or immediately following a meal. 30 tablet 2   ONETOUCH VERIO test strip USE AS DIRECTED TO CHECK  BLOOD SUGAR TWICE DAILY 200 strip 3   OZEMPIC, 2 MG/DOSE, 8 MG/3ML SOPN INJECT SUBCUTANEOUSLY 2 MG EVERY WEEK 9 mL 3   rosuvastatin (CRESTOR) 10 MG tablet TAKE 1 TABLET BY MOUTH DAILY 60 tablet 5   metFORMIN (GLUCOPHAGE-XR) 500 MG 24 hr tablet Take 1 tablet (500 mg total) by mouth 2 (two) times daily between meals. 180 tablet 3   No current facility-administered medications for this visit.    PHYSICAL EXAM: Vitals:   01/16/23 0846  BP: (!) 130/90  Pulse: 85  SpO2: 99%  Weight: 216 lb 3.2 oz (98.1 kg)  Height: 5\' 3"  (1.6 m)   Body mass index is 38.3 kg/m.  Wt Readings from Last 3 Encounters:  01/16/23 216 lb 3.2 oz (98.1 kg)  09/28/22 218 lb (98.9 kg)  08/08/22 219 lb 4.8 oz (99.5 kg)    General: Well developed, well nourished female in no apparent distress.  HEENT: AT/[place], no external lesions.  Eyes: Conjunctiva clear and no icterus. Neck: Neck supple, thyromegaly present right >  left, 2 x normal Lungs: Respirations not labored Neurologic: Alert, oriented, normal speech Extremities / Skin: Dry. No sores or rashes noted.  No hand tremors  Psychiatric: Does not appear depressed or anxious  Diabetic Foot Exam - Simple   No data filed    LABS Reviewed Lab Results  Component Value Date   HGBA1C 6.8 (A) 01/16/2023   HGBA1C 7.4 (A) 09/28/2022   HGBA1C 7.4 (H) 05/26/2022   No results found for: "FRUCTOSAMINE" Lab Results  Component Value Date   CHOL 184 05/26/2022   HDL 81.40 05/26/2022   LDLCALC 89 05/26/2022   TRIG 68.0 05/26/2022   CHOLHDL 2 05/26/2022   Lab Results  Component Value Date   MICRALBCREAT 8.3 02/03/2022   MICRALBCREAT 6.2 12/24/2020   Lab Results  Component Value Date   CREATININE 1.56 (H) 08/08/2022   Lab Results  Component Value Date   GFR 44.74 (L) 05/26/2022    ASSESSMENT / PLAN  1. Type 2 diabetes mellitus with stage 3b chronic kidney disease, without long-term current use of insulin (HCC)   2. Multinodular goiter   3. Subclinical hyperthyroidism   4. Stage 3b chronic kidney disease (HCC)   5. Long-term current use of injectable noninsulin antidiabetic medication   6. Long term (current) use of oral hypoglycemic drugs   7. 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 - Diabetic status / severity: Controlled  Lab Results  Component Value Date   HGBA1C 6.8 (A) 01/16/2023    - Hemoglobin A1c goal : <7% Patient has CKD with EGFR in 40s I would like to decrease the dose of metformin.  Adjusted diabetes regimen as follows.  - Medications: See below :  Diabetes regimen: Ozempic  2 mg weekly. Jardiance 25 mg daily. Decrease metformin extended release 500 mg 1 tablet 2 times a day.  Will refer to nephrology for your kidneys.  - Home glucose  testing: In the morning fasting and at bedtime. - Discussed/ Gave Hypoglycemia treatment plan.  # Consult : Refer to nephrology due to CKD.  # Annual urine for microalbuminuria/ creatinine ratio, no microalbuminuria currently, continue ACE/ARB , will check today and refer to nephrology. Last  Lab Results  Component Value Date   MICRALBCREAT 8.3 02/03/2022    # Foot check nightly.  # Annual dilated diabetic eye exams.  Following with ophthalmology.  She has retinopathy.  - Diet: Make healthy diabetic food choices - Life style / activity / exercise: Discussed.  2. Blood pressure  -  BP Readings from Last 1 Encounters:  01/16/23 (!) 130/90    - Control is in target.  - No change in current plans.  3. Lipid status / Hyperlipidemia - Last  Lab Results  Component Value Date   LDLCALC 89 05/26/2022   - Continue rosuvastatin 10 mg daily.  # Multiple thyroid nodules/subclinical hyperthyroidism : -Patient is known to have multiple thyroid nodules /multinodular goiter from 2006, at that time she had hyperthyroid symptoms, RAI uptake and scan showed relatively low uptake however had photopenic area on the right thyroid lobe.  In June 2015 she had ultrasound thyroid showed right dominant thyroid nodule measuring 5.1 cm and left thyroid nodule measuring 3.3 cm.  She had FNA of thyroid nodules in 2015 with benign cytology.  Plan: -Check thyroid function test.  She has history of atrial fibrillation currently on Eliquis following with cardiology. -If she continues to have low TSH we will start on low-dose of methimazole. -Will make plan to repeat ultrasound thyroid to monitor thyroid nodules after discussing with patient in coming follow-up visit.  Diagnoses and all orders for this visit:  Type 2 diabetes mellitus with stage 3b chronic kidney disease, without long-term current use of insulin (HCC) -     POCT glycosylated hemoglobin (Hb A1C) -     Basic metabolic panel; Future -      Microalbumin / creatinine urine ratio; Future -     metFORMIN (GLUCOPHAGE-XR) 500 MG 24 hr tablet; Take 1 tablet (500 mg total) by mouth 2 (two) times daily between meals. -     Microalbumin / creatinine urine ratio -     Basic metabolic panel  Multinodular goiter -     T3, free; Future -     T4, free; Future -     TSH; Future -     TRAb (TSH Receptor Binding Antibody); Future -     TRAb (TSH Receptor Binding Antibody) -     TSH -     T4, free -     T3, free  Subclinical hyperthyroidism -     T3, free; Future -     T4, free; Future -     TSH; Future -     TRAb (TSH Receptor Binding Antibody); Future -     TRAb (TSH Receptor Binding Antibody) -     TSH -     T4, free -     T3, free  Stage 3b chronic kidney disease (HCC) -     Ambulatory referral to Nephrology  Long-term current use of injectable noninsulin antidiabetic medication  Long term (current) use of oral hypoglycemic drugs  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   All questions answered and patient verbalized understanding of the plan.  [place] Haidyn Kilburg, MD [facility] Endocrinology [facility] [address] [place], [address] [place], [place] [address] Phone # [phone]  At least part of this note was generated using voice recognition software. Inadvertent word errors may have occurred, which were not recognized during the proofreading process.

## 2023-01-18 LAB — TRAB (TSH RECEPTOR BINDING ANTIBODY): TRAB: <1 IU/L (ref <=2.00)

## 2023-01-19 ENCOUNTER — Other Ambulatory Visit: Payer: Self-pay | Admitting: Endocrinology

## 2023-01-19 DIAGNOSIS — E0590 Thyrotoxicosis, unspecified without thyrotoxic crisis or storm: Secondary | ICD-10-CM

## 2023-01-19 MED ORDER — METHIMAZOLE 5 MG PO TABS: 5.0000 mg | ORAL_TABLET | Freq: Every day | ORAL | 2 refills | Status: DC

## 2023-01-28 ENCOUNTER — Other Ambulatory Visit: Payer: Self-pay | Admitting: Cardiovascular Disease

## 2023-01-28 DIAGNOSIS — I4891 Unspecified atrial fibrillation: Secondary | ICD-10-CM

## 2023-01-29 NOTE — Telephone Encounter (Signed)
Prescription refill request for Eliquis received. Indication:afib Last office visit:needs appt Scr:1.13  9/24 Age: 72 Weight:98.1  kg  Prescription refilled

## 2023-03-01 ENCOUNTER — Ambulatory Visit (INDEPENDENT_AMBULATORY_CARE_PROVIDER_SITE_OTHER): Payer: 59 | Admitting: Student

## 2023-03-01 ENCOUNTER — Encounter: Payer: Self-pay | Admitting: Student

## 2023-03-01 ENCOUNTER — Other Ambulatory Visit: Payer: Self-pay

## 2023-03-01 VITALS — BP 157/81 | HR 66 | Temp 98.6°F | Ht 63.0 in | Wt 215.1 lb

## 2023-03-01 DIAGNOSIS — E042 Nontoxic multinodular goiter: Secondary | ICD-10-CM

## 2023-03-01 DIAGNOSIS — M79604 Pain in right leg: Secondary | ICD-10-CM | POA: Diagnosis not present

## 2023-03-01 DIAGNOSIS — E785 Hyperlipidemia, unspecified: Secondary | ICD-10-CM

## 2023-03-01 DIAGNOSIS — N183 Chronic kidney disease, stage 3 unspecified: Secondary | ICD-10-CM | POA: Diagnosis not present

## 2023-03-01 DIAGNOSIS — Z7985 Long-term (current) use of injectable non-insulin antidiabetic drugs: Secondary | ICD-10-CM

## 2023-03-01 DIAGNOSIS — I129 Hypertensive chronic kidney disease with stage 1 through stage 4 chronic kidney disease, or unspecified chronic kidney disease: Secondary | ICD-10-CM | POA: Diagnosis not present

## 2023-03-01 DIAGNOSIS — E7800 Pure hypercholesterolemia, unspecified: Secondary | ICD-10-CM

## 2023-03-01 DIAGNOSIS — M79605 Pain in left leg: Secondary | ICD-10-CM | POA: Diagnosis not present

## 2023-03-01 DIAGNOSIS — E1122 Type 2 diabetes mellitus with diabetic chronic kidney disease: Secondary | ICD-10-CM | POA: Diagnosis not present

## 2023-03-01 DIAGNOSIS — Z0000 Encounter for general adult medical examination without abnormal findings: Secondary | ICD-10-CM

## 2023-03-01 DIAGNOSIS — Z7984 Long term (current) use of oral hypoglycemic drugs: Secondary | ICD-10-CM

## 2023-03-01 DIAGNOSIS — I10 Essential (primary) hypertension: Secondary | ICD-10-CM

## 2023-03-01 MED ORDER — BENAZEPRIL HCL 20 MG PO TABS
20.0000 mg | ORAL_TABLET | Freq: Every day | ORAL | 11 refills | Status: DC
Start: 2023-03-01 — End: 2023-04-27

## 2023-03-01 NOTE — Progress Notes (Addendum)
CC: Care establishment  HPI: [name] is a 72 y.o. female living with a history stated below and presents today for care establishment. Please see problem based assessment and plan for additional details.  Past Medical History:  Diagnosis Date   Atrial fibrillation (HCC)    Chronic disease anemia 03/29/2011   Diabetes mellitus    Dyslipidemia    Hypertension    Obesities, morbid (HCC)    Osteoarthritis     Current Outpatient Medications on File Prior to Visit  Medication Sig Dispense Refill   methimazole (TAPAZOLE) 5 MG tablet Take 1 tablet (5 mg total) by mouth daily. 90 tablet 2   apixaban (ELIQUIS) 5 MG TABS tablet Take 1 tablet (5 mg total) by mouth 2 (two) times daily. NEEDS CARDIOLOGY APPT FOR ELIQUIS REFILLS, CALL OFFICE. TAKE 1 TABLET BY MOUTH  TWICE DAILY 60 tablet 0   Blood Glucose Monitoring Suppl (ONETOUCH VERIO IQ SYSTEM) W/DEVICE KIT Use to check blood sugar 2 times per day dx code 250.02 1 kit 0   CALCIUM PO Take by mouth 2 (two) times daily.     diltiazem (CARDIZEM CD) 360 MG 24 hr capsule TAKE 1 CAPSULE BY MOUTH DAILY 100 capsule 2   ergocalciferol (VITAMIN D2) 50000 UNITS capsule Take 50,000 Units by mouth once a week.     ferrous sulfate 325 (65 FE) MG EC tablet Take 1 tablet (325 mg total) by mouth 2 (two) times daily. (Patient taking differently: Take 325 mg by mouth daily with breakfast. Taking 325mg  once daily) 60 tablet 3   furosemide (LASIX) 40 MG tablet Take 1 tablet (40 mg total) by mouth daily. 30 tablet 2   gabapentin (NEURONTIN) 300 MG capsule Take 600 mg by mouth 3 (three) times daily.     HYDROcodone-acetaminophen (NORCO/VICODIN) 5-325 MG tablet Take 0.5-1 tablets by mouth 2 (two) times daily.     JARDIANCE 25 MG TABS tablet TAKE 1 TABLET BY MOUTH DAILY  BEFORE BREAKFAST 100 tablet 2   metFORMIN (GLUCOPHAGE-XR) 500 MG 24 hr tablet Take 1 tablet (500 mg total) by mouth 2 (two) times daily between meals. 180 tablet 3   methocarbamol (ROBAXIN) 500  MG tablet Take 500 mg by mouth 2 (two) times daily.     metoprolol succinate (TOPROL-XL) 50 MG 24 hr tablet Take 1 tablet (50 mg total) by mouth daily. Take with or immediately following a meal. 30 tablet 2   ONETOUCH VERIO test strip USE AS DIRECTED TO CHECK  BLOOD SUGAR TWICE DAILY 200 strip 3   OZEMPIC, 2 MG/DOSE, 8 MG/3ML SOPN INJECT SUBCUTANEOUSLY 2 MG EVERY WEEK 9 mL 3   rosuvastatin (CRESTOR) 10 MG tablet TAKE 1 TABLET BY MOUTH DAILY 60 tablet 5   No current facility-administered medications on file prior to visit.    Family History  Problem Relation Age of Onset   Heart attack Mother    Stroke Mother    Diabetes Mother    Hypertension Brother    Breast cancer Neg Hx     Social History   Socioeconomic History   Marital status: Married    Spouse name: Not on file   Number of children: Not on file   Years of education: Not on file   Highest education level: Not on file  Occupational History   Not on file  Tobacco Use   Smoking status: Never   Smokeless tobacco: Never  Vaping Use   Vaping status: Never Used  Substance and Sexual Activity  Alcohol use: No   Drug use: No   Sexual activity: Not Currently  Other Topics Concern   Not on file  Social History Narrative   Not on file   Social Determinants of Health   Financial Resource Strain: Low Risk  (03/01/2023)   Overall Financial Resource Strain (CARDIA)    Difficulty of Paying Living Expenses: Not hard at all  Food Insecurity: No Food Insecurity (03/01/2023)   Hunger Vital Sign    Worried About Running Out of Food in the Last Year: Never true    Ran Out of Food in the Last Year: Never true  Transportation Needs: No Transportation Needs (03/01/2023)   PRAPARE - Administrator, Civil Service (Medical): No    Lack of Transportation (Non-Medical): No  Physical Activity: Insufficiently Active (03/01/2023)   Exercise Vital Sign    Days of Exercise per Week: 7 days    Minutes of Exercise per Session:  20 min  Stress: No Stress Concern Present (03/01/2023)   [employer] of Occupational Health - Occupational Stress Questionnaire    Feeling of Stress : Not at all  Social Connections: Moderately Isolated (03/01/2023)   Social Connection and Isolation Panel [NHANES]    Frequency of Communication with Friends and Family: More than three times a week    Frequency of Social Gatherings with Friends and Family: Once a week    Attends Religious Services: More than 4 times per year    Active Member of [employer] or Organizations: No    Attends Banker Meetings: Never    Marital Status: Widowed  Intimate Partner Violence: Not At Risk (03/01/2023)   Humiliation, Afraid, Rape, and Kick questionnaire    Fear of Current or Ex-Partner: No    Emotionally Abused: No    Physically Abused: No    Sexually Abused: No    Review of Systems: ROS negative except for what is noted on the assessment and plan.  Vitals:   03/01/23 1029 03/01/23 1038  BP: (!) 159/82 (!) 157/81  Pulse: 75 66  Temp: 98.6 F (37 C)   TempSrc: Oral   SpO2: 99%   Weight: 215 lb 1.6 oz (97.6 kg)   Height: 5\' 3"  (1.6 m)    Physical Exam Constitutional:      Appearance: Normal appearance.  HENT:     Head: Normocephalic and atraumatic.  Cardiovascular:     Rate and Rhythm: Normal rate and regular rhythm.  Pulmonary:     Effort: Pulmonary effort is normal.     Breath sounds: Normal breath sounds.  Abdominal:     General: Abdomen is flat.     Palpations: Abdomen is soft.  Musculoskeletal:     Comments: Bilateral lower extremity swelling; L>R, chronic issue per patient  Neurological:     Mental Status: She is alert.     Cranial Nerves: Cranial nerves 2-12 are intact.     Motor: Weakness present.     Gait: Gait abnormal.    Assessment & Plan:   DM (diabetes mellitus) type II controlled with renal manifestation (HCC) Diagnosed in 2000. Recently seen by endocrinology on 01/16/23. Last A1c 1 month ago was  6.8. Currently on jardiance, metformin, and ozempic. At last visit, range in low 100s. Highest was 175. Lowest in 90s. Decreased to ER 500 mg BID but was on 1000 mg BID. Decreased because of kidneys function.  -Continue f/u with endocrinology -Continue medication regimen   Multinodular goiter Patient has longstanding history of  multinodular goiter from 2006, per office note.  She has mildly low TSH frequently with normal free T4 and free T3 for several years. Recently started methimazole after low TSH. No palpitations or heat intolerance. No SOB, dysphagia, or other signs or symptoms.  -Continue f/u with endocrinology -Continue methimazole  Dyslipidemia Currently on crestor 10. Last lipid panel 9 months ago. LDL was 89. Will repeat lipid panel today for possible medication adjustments. -F/U lipid panel today  Hypertension BP today 159/82. Currently on dilt 360 mg and metop 50 mg and benazepril 10 mg.  Patient not currently measuring blood pressures at home so encouraged her to pick up a blood pressure cuff at her local store and begin monitoring her pressures.  Depending upon her blood pressure readings, we may consider increasing her medication dosage at her next visit.  -Continue current current medication regimen  Healthcare maintenance Patient noted having a colonoscopy recently at [facility].  It was noted to be normal.  We we will reach out to [facility] to gather this record as it is not on epic.  She also denied her flu shot.    Leg pain Patient endorses leg pain with no numbness.  This has been going on for roughly 1 month that is worse while walking.  Nothing makes it feel better and this has not happened before.  She has tried Tylenol which has not been helpful.  She does note that the pain is associated with a sharp sensation that starts on her right lower back and sometimes goes down her leg.  It is worse in her thigh.  The pain sometimes occurs in her left leg.  It is continuous and is  also associated with arm soreness in which she cannot raise her arms.  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  She notes that the pain improves as the day goes on but is still uncomfortable.  She denies any headache, fever, chills, vision changes, or jaw claudication.  There is some concern that this may be PMR, so we will follow-up with ESR, CRP, CK. - Referral to PT for strength improvement - Follow-up ESR, CRP, CK, BMP  Patient seen with Dr. Mariea Stable, MD  [facility], PGY-1 Date 03/02/2023 Time 9:03 AM

## 2023-03-01 NOTE — Patient Instructions (Signed)
Thank you so much for coming to the clinic today!   Today, we discussed your hypertension, dyslipidemia, and leg pain.  For your leg pain, we collected labs and made a referral for PT. I will follow up with these results when they come back.  For your hypertension, I have increased your benazepril from 10 mg to 20 mg.   We will see you again in 2 weeks.   If you have any questions please feel free to the call the clinic at anytime at [phone]. It was a pleasure seeing you!  Best, Dr. Rayvon Char

## 2023-03-02 DIAGNOSIS — M79606 Pain in leg, unspecified: Secondary | ICD-10-CM | POA: Insufficient documentation

## 2023-03-02 NOTE — Assessment & Plan Note (Signed)
>>  ASSESSMENT AND PLAN FOR DYSLIPIDEMIA WRITTEN ON 03/02/2023  8:56 AM BY STEPHANIE FREUND, MD  Currently on crestor  10. Last lipid panel 9 months ago. LDL was 89. Will repeat lipid panel today for possible medication adjustments. -F/U lipid panel today

## 2023-03-02 NOTE — Assessment & Plan Note (Addendum)
Patient noted having a colonoscopy recently at [facility].  It was noted to be normal.  We we will reach out to [facility] to gather this record as it is not on epic.  She also denied her flu shot.

## 2023-03-02 NOTE — Assessment & Plan Note (Signed)
Patient has longstanding history of multinodular goiter from 2006, per office note.  She has mildly low TSH frequently with normal free T4 and free T3 for several years. Recently started methimazole after low TSH. No palpitations or heat intolerance. No SOB, dysphagia, or other signs or symptoms.  -Continue f/u with endocrinology -Continue methimazole

## 2023-03-02 NOTE — Assessment & Plan Note (Signed)
BP today 159/82. Currently on dilt 360 mg and metop 50 mg and benazepril 10 mg.  Patient not currently measuring blood pressures at home so encouraged her to pick up a blood pressure cuff at her local store and begin monitoring her pressures.  Depending upon her blood pressure readings, we may consider increasing her medication dosage at her next visit.  -Continue current current medication regimen

## 2023-03-02 NOTE — Assessment & Plan Note (Signed)
Currently on crestor 10. Last lipid panel 9 months ago. LDL was 89. Will repeat lipid panel today for possible medication adjustments. -F/U lipid panel today

## 2023-03-02 NOTE — Assessment & Plan Note (Signed)
Patient endorses leg pain with no numbness.  This has been going on for roughly 1 month that is worse while walking.  Nothing makes it feel better and this has not happened before.  She has tried Tylenol which has not been helpful.  She does note that the pain is associated with a sharp sensation that starts on her right lower back and sometimes goes down her leg.  It is worse in her thigh.  The pain sometimes occurs in her left leg.  It is continuous and is also associated with arm soreness in which she cannot raise her arms.  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  She notes that the pain improves as the day goes on but is still uncomfortable.  She denies any headache, fever, chills, vision changes, or jaw claudication.  There is some concern that this may be PMR, so we will follow-up with ESR, CRP, CK. - Referral to PT for strength improvement - Follow-up ESR, CRP, CK, BMP

## 2023-03-02 NOTE — Assessment & Plan Note (Signed)
Diagnosed in 2000. Recently seen by endocrinology on 01/16/23. Last A1c 1 month ago was 6.8. Currently on jardiance, metformin, and ozempic. At last visit, range in low 100s. Highest was 175. Lowest in 90s. Decreased to ER 500 mg BID but was on 1000 mg BID. Decreased because of kidneys function.  -Continue f/u with endocrinology -Continue medication regimen

## 2023-03-03 LAB — LIPID PANEL
Chol/HDL Ratio: 1.9 ratio (ref 0.0–4.4)
Cholesterol, Total: 201 mg/dL — ABNORMAL HIGH (ref 100–199)
HDL: 108 mg/dL (ref >39)
LDL Chol Calc (NIH): 83 mg/dL (ref 0–99)
Triglycerides: 56 mg/dL (ref 0–149)
VLDL Cholesterol Cal: 10 mg/dL (ref 5–40)

## 2023-03-03 LAB — BMP8+ANION GAP
Anion Gap: 17 mmol/L (ref 10.0–18.0)
BUN/Creatinine Ratio: 16 (ref 12–28)
BUN: 19 mg/dL (ref 8–27)
CO2: 21 mmol/L (ref 20–29)
Calcium: 9.9 mg/dL (ref 8.7–10.3)
Chloride: 102 mmol/L (ref 96–106)
Creatinine, Ser: 1.18 mg/dL — ABNORMAL HIGH (ref 0.57–1.00)
Glucose: 98 mg/dL (ref 70–99)
Potassium: 3.5 mmol/L (ref 3.5–5.2)
Sodium: 140 mmol/L (ref 134–144)
eGFR: 49 mL/min/{1.73_m2} — ABNORMAL LOW (ref >59)

## 2023-03-03 LAB — CK: Total CK: 88 U/L (ref 32–182)

## 2023-03-03 LAB — SEDIMENTATION RATE: Sed Rate: 20 mm/h (ref 0–40)

## 2023-03-03 LAB — C-REACTIVE PROTEIN: CRP: 8 mg/L (ref 0–10)

## 2023-03-05 NOTE — Progress Notes (Signed)
Called patient to discuss lab results. Her BMP is notable for a stable creatinine of 1.18. She is also noted to have a history of CKD stage 3b, and her eGFR is stable at 49. Her cholesterol is slightly elevated at 201, and her LDL is slightly elevated at 83. We will not adjust her statin at this time but will continue to closely monitor her lipid profile and encourage a healthy lifestyle. Her CK, ESR, and CRP were all WNL, which makes PMR less likely.

## 2023-03-08 ENCOUNTER — Other Ambulatory Visit: Payer: Self-pay

## 2023-03-08 DIAGNOSIS — E1165 Type 2 diabetes mellitus with hyperglycemia: Secondary | ICD-10-CM

## 2023-03-08 MED ORDER — EMPAGLIFLOZIN 25 MG PO TABS: 25.0000 mg | ORAL_TABLET | Freq: Every day | ORAL | 2 refills | Status: DC

## 2023-03-09 NOTE — Progress Notes (Signed)
Internal Medicine Clinic Attending  I saw and evaluated the patient.  I personally confirmed the key portions of the history and exam documented by the resident  and I reviewed pertinent patient test results.  The assessment, diagnosis, and plan were formulated together and I agree with the documentation in the resident's note.  

## 2023-03-15 NOTE — Therapy (Signed)
OUTPATIENT PHYSICAL THERAPY EVALUATION   Patient Name: Megan Fischer MRN: [id number] DOB:[birth year], 72 y.o., female Today's Date: 03/16/2023  END OF SESSION:  PT End of Session - 03/16/23 0842     Visit Number 1    Number of Visits 7    Date for PT Re-Evaluation 04/27/23    Authorization Type [employer]    Authorization Time Period no auth per appt notes    Progress Note Due on Visit 10    PT Start Time 0844    PT Stop Time 0924    PT Time Calculation (min) 40 min    Activity Tolerance Patient tolerated treatment well;No increased pain    Behavior During Therapy [facility] for tasks assessed/performed             Past Medical History:  Diagnosis Date   Atrial fibrillation (HCC)    Chronic disease anemia 03/29/2011   Diabetes mellitus    Dyslipidemia    Hypertension    Obesities, morbid (HCC)    Osteoarthritis    Past Surgical History:  Procedure Laterality Date   CHOLECYSTECTOMY     REPLACEMENT TOTAL KNEE BILATERAL     SHOULDER SURGERY     RIGHT SHOULDER   TUBAL LIGATION     Patient Active Problem List   Diagnosis Date Noted   Leg pain 03/02/2023   Acute respiratory failure with hypoxia (HCC) 04/11/2021   Acute on chronic diastolic CHF (congestive heart failure) (HCC) 04/11/2021   Hypercholesterolemia 09/17/2018   Iron deficiency anemia 06/24/2017   Anemia of chronic disease 07/20/2016   Healthcare maintenance 06/09/2013   Multinodular goiter 01/20/2013   Chronic disease anemia 03/29/2011   Obesities, morbid (HCC)    Hypertension    Dyslipidemia    DM (diabetes mellitus) type II controlled with renal manifestation (HCC)    Osteoarthritis    Atrial fibrillation (HCC) 08/04/2010    PCP: Morrie Sheldon, MD  REFERRING PROVIDER: Mercie Eon, MD  REFERRING DIAG: M79.604,M79.605 (ICD-10-CM) - Pain in both lower extremities  Rationale for Evaluation and Treatment: Rehabilitation  THERAPY DIAG:  Other low back pain  Other abnormalities of gait and  mobility  Muscle weakness (generalized)  ONSET DATE: March 2024  SUBJECTIVE:                                                                                                                                                                                           SUBJECTIVE STATEMENT: Pt denies any change in activities, gradual onset of R>L LE pain around March 2024. She describes it as aching without any N/T. Tends to start at low back and gradually move  distally into anterior thighs as it worsens. 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 No bowel/bladder canges, no saddle anesthesia. Slowly worsening over time.   PERTINENT HISTORY:  afib, HTN, CHF, DM2, anemia   PAIN:  Are you having pain: 7/10 Location/description: low back into hips and anterior thighs, R>L Best-worst over past week: 7-10/10  - aggravating factors: constant, walking, activities around apartment  - Easing factors: rest  PRECAUTIONS: cardiac hx, fall risk    WEIGHT BEARING RESTRICTIONS: No  FALLS:  Has patient fallen in last 6 months? Yes. Number of falls 6, tend to occur while standing or walking, loss of balance   LIVING ENVIRONMENT: 1 level home, alone, no steps to enter Does housework, does not drive; transportation with her brother  OCCUPATION: retired   PLOF: Independent - use of rollator since May 2024, using cane for several years before  PATIENT GOALS: feel better, get off of rollator and improve walking tolerance  NEXT MD VISIT: Nov 14th  OBJECTIVE:  Note: Objective measures were completed at Evaluation unless otherwise noted.  DIAGNOSTIC FINDINGS:  No recent imaging in chart, pt denies imaging  PATIENT SURVEYS:  FOTO 39 current, 55 predicted  SCREENING FOR RED FLAGS: Red flag screening reassuring overall - bowel/bladder changes: denies - bucking/overt weakness:  denies - bilateral LE numbness: denies - saddle anesthesia: denies  COGNITION: Overall cognitive status: Within functional limits for tasks assessed     SENSATION: Chronic sensory issues L LE, otherwise LT intact Negative clonus, hoffman, tromners BIL  POSTURE: tendency for lateral shift towards L, forward head, increased T spine kyphosis  PALPATION: deferred  LUMBAR ROM:   AROM eval  Flexion   Extension   Right lateral flexion   Left lateral flexion   Right rotation 75% *  Left rotation 100%   (Blank rows = not tested) (Key: WFL = within functional limits not formally assessed, * = concordant pain, s = stiffness/stretching sensation, NT = not tested)   LOWER EXTREMITY ROM:     Active  Right eval Left eval  Hip flexion    Hip extension    Hip internal rotation    Hip external rotation    Knee extension    Knee flexion    (Blank rows = not tested) (Key: WFL = within functional limits not formally assessed, * = concordant pain, s = stiffness/stretching sensation, NT = not tested)  Comments:    LOWER EXTREMITY MMT:    MMT Right eval Left eval  Hip flexion 3- * 3- *  Hip abduction (modified sitting) 4- * 4- *  Hip internal rotation    Hip external rotation    Knee flexion    Knee extension    Ankle dorsiflexion     (Blank rows = not tested) (Key: WFL = within functional limits not formally assessed, * = concordant pain, s = stiffness/stretching sensation, NT = not tested)  Comments:    LUMBAR SPECIAL TESTS:  deferred  FUNCTIONAL TESTS:  Sit to stand: requires heavy UE support from standard chair, wide BOS TUG: 21.68 sec with rollator  GAIT: Distance walked: within clinic Assistive device utilized: Walker - 4 wheeled Level of assistance: Modified independence Comments: reduced gait speed/cadence  TODAY'S TREATMENT:  [facility]  PT Treatment:                                                DATE: 03/16/23 Therapeutic Exercise: Seated march x5 BIL Seated hip abduction x8 BIL (unresisted) HEP handout + education  PATIENT EDUCATION:  Education details: Pt education on PT impairments, prognosis, and POC. Informed consent. Rationale for interventions, safe/appropriate HEP performance Person educated: Patient Education method: Explanation, Demonstration, Tactile cues, Verbal cues, and Handouts Education comprehension: verbalized understanding, returned demonstration, verbal cues required, tactile cues required, and needs further education    HOME EXERCISE PROGRAM: Access Code: [email or web address] URL: [email or web address] Date: 03/16/2023 Prepared by: Fransisco Hertz  Exercises - Seated March  - 2-3 x daily - 1 sets - 5 reps - Seated Hip Abduction  - 2-3 x daily - 1 sets - 8 reps  ASSESSMENT:  CLINICAL IMPRESSION: Patient is a pleasant 72 y.o. woman who was seen today for physical therapy evaluation and treatment for BIL LE pain slowly worsening since March 2024, limiting her tolerance to functional mobility and daily tasks. In discussion with pt appears to be a lumbar component as she states symptoms tend to begin in low back and move distally as symptoms worsen. They are reproduced with lumbar rotation today and hip MMT testing. TUG score also indicative of fall risk and reduced functional mobility. Pt tolerates HEP without increase in pain, no adverse events throughout. Recommend trial of skilled PT to address aforementioned deficits with aim of improving functional tolerance and reducing pain with typical activities. Pt departs today's session in no acute distress, all voiced concerns/questions addressed appropriately from PT perspective.      OBJECTIVE IMPAIRMENTS: Abnormal gait, decreased activity tolerance, decreased balance, decreased endurance, decreased mobility, difficulty walking, decreased ROM, decreased  strength, postural dysfunction, and pain.   ACTIVITY LIMITATIONS: carrying, lifting, bending, standing, transfers, and locomotion level  PARTICIPATION LIMITATIONS: meal prep, cleaning, medication management, and community activity  PERSONAL FACTORS: Age, Time since onset of injury/illness/exacerbation, and 3+ comorbidities: afib, HTN, CHF, DM2, anemia  are also affecting patient's functional outcome.   REHAB POTENTIAL: Fair given comorbidities  CLINICAL DECISION MAKING: Evolving/moderate complexity  EVALUATION COMPLEXITY: Moderate   GOALS: Goals reviewed with patient? Yes  SHORT TERM GOALS: Target date: 04/06/2023 Pt will demonstrate appropriate understanding and performance of initially prescribed HEP in order to facilitate improved independence with management of symptoms.  Baseline: HEP provided on eval Goal status: INITIAL   2. Pt will score greater than or equal to 42 on FOTO in order to demonstrate improved perception of function due to symptoms.  Baseline: 39  Goal status: INITIAL   LONG TERM GOALS: Target date: 04/27/2023 Pt will score 55 or greater on FOTO in order to demonstrate improved perception of function due to symptoms.  Baseline: 39 Goal status: INITIAL  2.  Pt will demonstrate at least 4/5 hip flexion MMTin order to facilitate improved tolerance to functional mobility. Baseline: see MMT chart above Goal status: INITIAL  3. Pt will report at least 50% decrease in overall pain levels in past week in order to facilitate improved tolerance to basic ADLs/mobility.   Baseline: 7-10/10  Goal status: INITIAL    4. Pt will be able to perform TUG in less than or equal to 15 sec in order to indicate reduced risk of falling (cutoff score for fall  risk 13.5 sec in community dwelling older adults per [facility] et al, 2000)  Baseline: 21 sec w/ rollator  Goal status: INITIAL    PLAN:  PT FREQUENCY: 1-2x/week  PT DURATION: 6 weeks  PLANNED INTERVENTIONS: [id number]-  PT Re-evaluation, 97110-Therapeutic exercises, 97530- Therapeutic activity, 97112- Neuromuscular re-education, 97535- Self Care, 16109- Manual therapy, [id number]- Gait training, Patient/Family education, Balance training, Stair training, Taping, Joint mobilization, Spinal mobilization, Cryotherapy, and Moist heat.  PLAN FOR NEXT SESSION: Review/update HEP PRN. Work on [employer] exercises as appropriate with emphasis on lumbopelvic stability and endurance, functional mobility tolerance. Symptom modification strategies as indicated/appropriate.    Ashley Murrain PT, DPT 03/16/2023 9:26 AM

## 2023-03-16 ENCOUNTER — Encounter: Payer: Self-pay | Admitting: Physical Therapy

## 2023-03-16 ENCOUNTER — Ambulatory Visit: Payer: 59 | Attending: Internal Medicine | Admitting: Physical Therapy

## 2023-03-16 ENCOUNTER — Other Ambulatory Visit: Payer: Self-pay

## 2023-03-16 DIAGNOSIS — M6281 Muscle weakness (generalized): Secondary | ICD-10-CM

## 2023-03-16 DIAGNOSIS — M79605 Pain in left leg: Secondary | ICD-10-CM | POA: Insufficient documentation

## 2023-03-16 DIAGNOSIS — M79604 Pain in right leg: Secondary | ICD-10-CM | POA: Insufficient documentation

## 2023-03-16 DIAGNOSIS — R2689 Other abnormalities of gait and mobility: Secondary | ICD-10-CM

## 2023-03-16 DIAGNOSIS — M5459 Other low back pain: Secondary | ICD-10-CM

## 2023-03-20 ENCOUNTER — Encounter: Payer: Self-pay | Admitting: Endocrinology

## 2023-03-20 ENCOUNTER — Other Ambulatory Visit: Payer: Self-pay | Admitting: Cardiovascular Disease

## 2023-03-20 ENCOUNTER — Ambulatory Visit (INDEPENDENT_AMBULATORY_CARE_PROVIDER_SITE_OTHER): Payer: 59 | Admitting: Endocrinology

## 2023-03-20 VITALS — BP 150/80 | HR 110 | Resp 20 | Ht 63.0 in | Wt 207.2 lb

## 2023-03-20 DIAGNOSIS — E1122 Type 2 diabetes mellitus with diabetic chronic kidney disease: Secondary | ICD-10-CM | POA: Diagnosis not present

## 2023-03-20 DIAGNOSIS — N1832 Chronic kidney disease, stage 3b: Secondary | ICD-10-CM

## 2023-03-20 DIAGNOSIS — Z7985 Long-term (current) use of injectable non-insulin antidiabetic drugs: Secondary | ICD-10-CM

## 2023-03-20 DIAGNOSIS — I4891 Unspecified atrial fibrillation: Secondary | ICD-10-CM

## 2023-03-20 DIAGNOSIS — E0590 Thyrotoxicosis, unspecified without thyrotoxic crisis or storm: Secondary | ICD-10-CM | POA: Diagnosis not present

## 2023-03-20 DIAGNOSIS — E042 Nontoxic multinodular goiter: Secondary | ICD-10-CM

## 2023-03-20 DIAGNOSIS — Z7984 Long term (current) use of oral hypoglycemic drugs: Secondary | ICD-10-CM

## 2023-03-20 LAB — T4, FREE: Free T4: 0.73 ng/dL (ref 0.60–1.60)

## 2023-03-20 LAB — TSH: TSH: 2.67 u[IU]/mL (ref 0.35–5.50)

## 2023-03-20 LAB — T3, FREE: T3, Free: 3.1 pg/mL (ref 2.3–4.2)

## 2023-03-20 NOTE — Progress Notes (Signed)
Outpatient Endocrinology Note Megan Sherlin Sonier, MD  03/20/23  Patient's Name: Megan Fischer    DOB: [birth year]    MRN: [id number]                                                    REASON OF VISIT: Follow up of type 2 diabetes mellitus  PCP: Morrie Sheldon, MD  HISTORY OF PRESENT ILLNESS:   Megan Fischer is a 72 y.o. old female with past medical history listed below, is here for follow up of type 2 diabetes mellitus /subclinical hyperthyroidism/multinodular goiter.   Pertinent Diabetes History: Patient was diagnosed with type 2 diabetes mellitus in 2000.  She had been on various regimen of diabetes treatment in the past including By[place] and generally had done better with GLP-1 receptor agonist medications.  She used to be on low-dose of glimepiride which was stopped due to hypoglycemia in November 2015.  She had been on Victoza since 2013 until 2019.  Chronic Diabetes Complications : Retinopathy: left yes. Last ophthalmology exam was done on annually.  Nephropathy: CKD present, on benzepril Peripheral neuropathy: no Coronary artery disease: no Stroke: no  Relevant comorbidities and cardiovascular risk factors: Obesity: yes Body mass index is 36.7 kg/m.  Hypertension: yes, following with cardiology. Hyperlipidemia. Yes, on a statin.  Current / Home Diabetic regimen includes: Jardiance 25 mg daily. Metformin extended release 500 mg two times a day.  Ozempic 2 mg weekly.  Prior diabetic medications: Victoza Amaryl/glimepiride is stopped due to hypoglycemia in 2015.  Glycemic data:   She uses One Touch Verio flex glucometer.  In the last 2 weeks from October 29 to November 12, average blood sugar 127, highest blood sugar 206, lowest 96, she has been taking 0.7 times a day in a brace.  She has been mostly sick in the morning some of the blood sugar 104, 96, 113, 140, in the evening 206, 125.  Hypoglycemia: Patient has no hypoglycemic episodes. Patient has hypoglycemia  awareness.  Factors modifying glucose control: 1.  Diabetic diet assessment: 3 meals a day.  2.  Staying active or exercising: No formal exercise  3.  Medication compliance: compliant all of the time.  # Multiple thyroid nodules /multinodular goiter /subclinical hyperthyroidism: Patient has longstanding history of multinodular goiter from 2006, per office note.  She has mildly low TSH frequently with normal free T4 and free T3 for several years. -RAI uptake and scan in June 27, 2004 : There was slightly enlarged thyroid with area of relatively low uptake in the somewhat enlarged area of the right mid lobe of the thyroid.  Uptake was in the lower normal range. -Ultrasound thyroid in October 20, 2013 showed a dominant 5.1 cm heterogeneous nodule is noted in the midportion of the right lobe of the thyroid, this was corresponding to the relative photopenic area in the right lobe seen on the prior nuclear exam from 2006.  Multiple smaller less than 1 cm nodules are noted scattered throughout the right lobe of the thyroid.  A 3.3 cm nodule was noted in the midportion of the left lobe of the thyroid. -Patient had FNA of bilateral dominant thyroid nodules in November 13, 2013, cytology benign consistent with nonneoplastic goiter. -Patient has no follow-up ultrasound after 2015.  # In September 2024, patient remained subclinical hyperthyroid and started on methimazole 5  mg daily.  # She has history of atrial fibrillation currently on Eliquis, following with cardiology.  Interval history 03/20/23 Patient was started on methimazole in September.  She denies any allergic reaction.  No fever or any illness.  No GI issues.  She has been taking methimazole 5 mg daily, reports compliance.  She has not noticed any new symptoms.  Denies palpitation or heat intolerance.  No neck discomfort/dysphagia.  Glucometer data as reviewed above.  Mostly acceptable blood sugar.  She has decreased the dose of metformin as  discussed in last visit.  She has been tolerating Ozempic well.  No other complaints today.  REVIEW OF SYSTEMS As per history of present illness.   PAST MEDICAL HISTORY: Past Medical History:  Diagnosis Date   Atrial fibrillation (HCC)    Chronic disease anemia 03/29/2011   Diabetes mellitus    Dyslipidemia    Hypertension    Obesities, morbid (HCC)    Osteoarthritis     PAST SURGICAL HISTORY: Past Surgical History:  Procedure Laterality Date   CHOLECYSTECTOMY     REPLACEMENT TOTAL KNEE BILATERAL     SHOULDER SURGERY     RIGHT SHOULDER   TUBAL LIGATION      ALLERGIES: Allergies  Allergen Reactions   Aspirin     Other reaction(s): Unknown   Codeine    Fentanyl Other (See Comments)    hypoxia   Oxycodone Hcl    Penicillins    Chlorhexidine Gluconate Rash    FAMILY HISTORY:  Family History  Problem Relation Age of Onset   Heart attack Mother    Stroke Mother    Diabetes Mother    Hypertension Brother    Breast cancer Neg Hx     SOCIAL HISTORY: Social History   Socioeconomic History   Marital status: Married    Spouse name: Not on file   Number of children: Not on file   Years of education: Not on file   Highest education level: Not on file  Occupational History   Not on file  Tobacco Use   Smoking status: Never   Smokeless tobacco: Never  Vaping Use   Vaping status: Never Used  Substance and Sexual Activity   Alcohol use: No   Drug use: No   Sexual activity: Not Currently  Other Topics Concern   Not on file  Social History Narrative   Not on file   Social Determinants of Health   Financial Resource Strain: Low Risk  (03/01/2023)   Overall Financial Resource Strain (CARDIA)    Difficulty of Paying Living Expenses: Not hard at all  Food Insecurity: No Food Insecurity (03/01/2023)   Hunger Vital Sign    Worried About Running Out of Food in the Last Year: Never true    Ran Out of Food in the Last Year: Never true  Transportation Needs: No  Transportation Needs (03/01/2023)   PRAPARE - Administrator, Civil Service (Medical): No    Lack of Transportation (Non-Medical): No  Physical Activity: Insufficiently Active (03/01/2023)   Exercise Vital Sign    Days of Exercise per Week: 7 days    Minutes of Exercise per Session: 20 min  Stress: No Stress Concern Present (03/01/2023)   [employer] of Occupational Health - Occupational Stress Questionnaire    Feeling of Stress : Not at all  Social Connections: Moderately Isolated (03/01/2023)   Social Connection and Isolation Panel [NHANES]    Frequency of Communication with Friends and Family: More than  three times a week    Frequency of Social Gatherings with Friends and Family: Once a week    Attends Religious Services: More than 4 times per year    Active Member of [employer] or Organizations: No    Attends Banker Meetings: Never    Marital Status: Widowed    MEDICATIONS:  Current Outpatient Medications  Medication Sig Dispense Refill   apixaban (ELIQUIS) 5 MG TABS tablet Take 1 tablet (5 mg total) by mouth 2 (two) times daily. Needs cardiology appointment for Eliquis Refills, call office 60 tablet 0   benazepril (LOTENSIN) 20 MG tablet Take 1 tablet (20 mg total) by mouth daily. 30 tablet 11   Blood Glucose Monitoring Suppl (ONETOUCH VERIO IQ SYSTEM) W/DEVICE KIT Use to check blood sugar 2 times per day dx code 250.02 1 kit 0   CALCIUM PO Take by mouth 2 (two) times daily.     diltiazem (CARDIZEM CD) 360 MG 24 hr capsule TAKE 1 CAPSULE BY MOUTH DAILY 100 capsule 2   empagliflozin (JARDIANCE) 25 MG TABS tablet Take 1 tablet (25 mg total) by mouth daily before breakfast. 100 tablet 2   ergocalciferol (VITAMIN D2) 50000 UNITS capsule Take 50,000 Units by mouth once a week.     ferrous sulfate 325 (65 FE) MG EC tablet Take 1 tablet (325 mg total) by mouth 2 (two) times daily. (Patient taking differently: Take 325 mg by mouth daily with breakfast. Taking  325mg  once daily) 60 tablet 3   furosemide (LASIX) 40 MG tablet Take 1 tablet (40 mg total) by mouth daily. 30 tablet 2   gabapentin (NEURONTIN) 300 MG capsule Take 600 mg by mouth 3 (three) times daily.     HYDROcodone-acetaminophen (NORCO/VICODIN) 5-325 MG tablet Take 0.5-1 tablets by mouth 2 (two) times daily.     metFORMIN (GLUCOPHAGE-XR) 500 MG 24 hr tablet Take 1 tablet (500 mg total) by mouth 2 (two) times daily between meals. 180 tablet 3   methimazole (TAPAZOLE) 5 MG tablet Take 1 tablet (5 mg total) by mouth daily. 90 tablet 2   methocarbamol (ROBAXIN) 500 MG tablet Take 500 mg by mouth 2 (two) times daily.     metoprolol succinate (TOPROL-XL) 50 MG 24 hr tablet Take 1 tablet (50 mg total) by mouth daily. Take with or immediately following a meal. 30 tablet 2   ONETOUCH VERIO test strip USE AS DIRECTED TO CHECK  BLOOD SUGAR TWICE DAILY 200 strip 3   OZEMPIC, 2 MG/DOSE, 8 MG/3ML SOPN INJECT SUBCUTANEOUSLY 2 MG EVERY WEEK 9 mL 3   rosuvastatin (CRESTOR) 10 MG tablet TAKE 1 TABLET BY MOUTH DAILY 60 tablet 5   No current facility-administered medications for this visit.    PHYSICAL EXAM: Vitals:   03/20/23 0921 03/20/23 0937  BP: (!) 142/80 (!) 150/80  Pulse: (!) 110   Resp: 20   SpO2: 98%   Weight: 207 lb 3.2 oz (94 kg)   Height: 5\' 3"  (1.6 m)    Body mass index is 36.7 kg/m.  Wt Readings from Last 3 Encounters:  03/20/23 207 lb 3.2 oz (94 kg)  03/01/23 215 lb 1.6 oz (97.6 kg)  01/16/23 216 lb 3.2 oz (98.1 kg)    General: Well developed, well nourished female in no apparent distress.  HEENT: AT/[place], no external lesions.  Eyes: Conjunctiva clear and no icterus. Neck: Neck supple, thyromegaly present right > left, 2 x normal Lungs: Respirations not labored Neurologic: Alert, oriented, normal speech Extremities /  Skin: Dry. No sores or rashes noted.  No hand tremors  Psychiatric: Does not appear depressed or anxious  Diabetic Foot Exam - Simple   No data filed    LABS  Reviewed Lab Results  Component Value Date   HGBA1C 6.8 (A) 01/16/2023   HGBA1C 7.4 (A) 09/28/2022   HGBA1C 7.4 (H) 05/26/2022   No results found for: "FRUCTOSAMINE" Lab Results  Component Value Date   CHOL 201 (H) 03/01/2023   HDL 108 03/01/2023   LDLCALC 83 03/01/2023   TRIG 56 03/01/2023   CHOLHDL 1.9 03/01/2023   Lab Results  Component Value Date   MICRALBCREAT 18.4 01/16/2023   MICRALBCREAT 8.3 02/03/2022   Lab Results  Component Value Date   CREATININE 1.18 (H) 03/01/2023   Lab Results  Component Value Date   GFR 48.83 (L) 01/16/2023    ASSESSMENT / PLAN  1. Subclinical hyperthyroidism   2. Type 2 diabetes mellitus with stage 3b chronic kidney disease, without long-term current use of insulin (HCC)   3. Multinodular goiter      Diabetes Mellitus type 2, complicated by CKD /diabetic retinopathy. - Diabetic status / severity: Controlled  Lab Results  Component Value Date   HGBA1C 6.8 (A) 01/16/2023    - Hemoglobin A1c goal : <7% - Medications: See below :  Diabetes regimen: Ozempic 2 mg weekly. Jardiance 25 mg daily. Metformin extended release 500 mg 1 tablet 2 times a day.  - Home glucose testing: In the morning fasting and at bedtime. - Discussed/ Gave Hypoglycemia treatment plan.  # Consult : Referred to nephrology due to CKD.  # Annual urine for microalbuminuria/ creatinine ratio, no microalbuminuria currently, continue ACE/ARB , will check today and referred to nephrology. Last  Lab Results  Component Value Date   MICRALBCREAT 18.4 01/16/2023    # Foot check nightly.  # Annual dilated diabetic eye exams.  Following with ophthalmology.  She has retinopathy.  - Diet: Make healthy diabetic food choices - Life style / activity / exercise: Discussed.  2. Blood pressure  -  BP Readings from Last 1 Encounters:  03/20/23 (!) 150/80    - Control is not in target. Asked to monitor at home as well.  Discussed with primary care provider if  remained high. - No change in current plans.  3. Lipid status / Hyperlipidemia - Last  Lab Results  Component Value Date   LDLCALC 83 03/01/2023   - Continue rosuvastatin 10 mg daily.  # Multiple thyroid nodules/subclinical hyperthyroidism : -Patient is known to have multiple thyroid nodules /multinodular goiter from 2006, at that time she had hyperthyroid symptoms, RAI uptake and scan showed relatively low uptake however had photopenic area on the right thyroid lobe.  In June 2015 she had ultrasound thyroid showed right dominant thyroid nodule measuring 5.1 cm and left thyroid nodule measuring 3.3 cm.  She had FNA of thyroid nodules in 2015 with benign cytology. -She is currently on methimazole 5 mg daily.  He started in the beginning of September 2024.  Plan: -Will check thyroid function test and adjust the dose of methimazole. -Discussed about repeating ultrasound thyroid to monitor thyroid nodules, patient wants to hold off for now will consider in the future.  Diagnoses and all orders for this visit:  Subclinical hyperthyroidism -     T4, free; Future -     TSH; Future -     T3, free; Future -     T3, free -  TSH -     T4, free  Type 2 diabetes mellitus with stage 3b chronic kidney disease, without long-term current use of insulin (HCC)  Multinodular goiter     DISPOSITION Follow up in clinic in 3 months suggested.   All questions answered and patient verbalized understanding of the plan.  Megan Kynlee Koenigsberg, MD [facility] Endocrinology [facility] [address] [place], [address] [place], [place] [address] Phone # [phone]  At least part of this note was generated using voice recognition software. Inadvertent word errors may have occurred, which were not recognized during the proofreading process.

## 2023-03-20 NOTE — Telephone Encounter (Signed)
Prescription refill request for Eliquis received. Indication:afib Last office visit:needs appt Scr:1.18  10/24 Age: 72 Weight:97.6  kg  Prescription refilled

## 2023-03-22 ENCOUNTER — Encounter: Payer: Self-pay | Admitting: Student

## 2023-03-22 ENCOUNTER — Ambulatory Visit: Payer: 59 | Admitting: Student

## 2023-03-22 VITALS — BP 129/80 | HR 60 | Temp 98.5°F | Ht 63.0 in | Wt 203.5 lb

## 2023-03-22 DIAGNOSIS — M79605 Pain in left leg: Secondary | ICD-10-CM | POA: Diagnosis not present

## 2023-03-22 DIAGNOSIS — M79604 Pain in right leg: Secondary | ICD-10-CM | POA: Diagnosis not present

## 2023-03-22 DIAGNOSIS — I10 Essential (primary) hypertension: Secondary | ICD-10-CM | POA: Diagnosis not present

## 2023-03-22 MED ORDER — DULOXETINE HCL 30 MG PO CPEP: 30.0000 mg | ORAL_CAPSULE | Freq: Every day | ORAL | 3 refills | Status: DC

## 2023-03-22 MED ORDER — DICLOFENAC SODIUM 1 % EX GEL: 2.0000 g | Freq: Four times a day (QID) | CUTANEOUS | 3 refills | Status: DC

## 2023-03-22 NOTE — Patient Instructions (Signed)
Thank you so much for coming to the clinic today!   For your pain, I have put in an order for duloxetine. Please take this once daily. I have also put in an order for Voltaren gel that you can put on painful joints.   We will follow-up in about one month.   If you have any questions please feel free to the call the clinic at anytime at [phone]. It was a pleasure seeing you!  Best, Dr. Rayvon Char

## 2023-03-22 NOTE — Progress Notes (Signed)
CC: Leg pain follow-up  HPI: [name] is a 72 y.o. female living with a history stated below and presents today for leg pain follow-up. Please see problem based assessment and plan for additional details.  Past Medical History:  Diagnosis Date   Atrial fibrillation (HCC)    Chronic disease anemia 03/29/2011   Diabetes mellitus    Dyslipidemia    Hypertension    Obesities, morbid (HCC)    Osteoarthritis     Current Outpatient Medications on File Prior to Visit  Medication Sig Dispense Refill   apixaban (ELIQUIS) 5 MG TABS tablet Take 1 tablet (5 mg total) by mouth 2 (two) times daily. Needs cardiology appointment for Eliquis Refills, call office 60 tablet 0   benazepril (LOTENSIN) 20 MG tablet Take 1 tablet (20 mg total) by mouth daily. 30 tablet 11   Blood Glucose Monitoring Suppl (ONETOUCH VERIO IQ SYSTEM) W/DEVICE KIT Use to check blood sugar 2 times per day dx code 250.02 1 kit 0   CALCIUM PO Take by mouth 2 (two) times daily.     diltiazem (CARDIZEM CD) 360 MG 24 hr capsule TAKE 1 CAPSULE BY MOUTH DAILY 100 capsule 2   empagliflozin (JARDIANCE) 25 MG TABS tablet Take 1 tablet (25 mg total) by mouth daily before breakfast. 100 tablet 2   ergocalciferol (VITAMIN D2) 50000 UNITS capsule Take 50,000 Units by mouth once a week.     ferrous sulfate 325 (65 FE) MG EC tablet Take 1 tablet (325 mg total) by mouth 2 (two) times daily. (Patient taking differently: Take 325 mg by mouth daily with breakfast. Taking 325mg  once daily) 60 tablet 3   furosemide (LASIX) 40 MG tablet Take 1 tablet (40 mg total) by mouth daily. 30 tablet 2   gabapentin (NEURONTIN) 300 MG capsule Take 600 mg by mouth 3 (three) times daily.     HYDROcodone-acetaminophen (NORCO/VICODIN) 5-325 MG tablet Take 0.5-1 tablets by mouth 2 (two) times daily.     metFORMIN (GLUCOPHAGE-XR) 500 MG 24 hr tablet Take 1 tablet (500 mg total) by mouth 2 (two) times daily between meals. 180 tablet 3   methimazole (TAPAZOLE) 5  MG tablet Take 1 tablet (5 mg total) by mouth daily. 90 tablet 2   methocarbamol (ROBAXIN) 500 MG tablet Take 500 mg by mouth 2 (two) times daily.     metoprolol succinate (TOPROL-XL) 50 MG 24 hr tablet Take 1 tablet (50 mg total) by mouth daily. Take with or immediately following a meal. 30 tablet 2   ONETOUCH VERIO test strip USE AS DIRECTED TO CHECK  BLOOD SUGAR TWICE DAILY 200 strip 3   OZEMPIC, 2 MG/DOSE, 8 MG/3ML SOPN INJECT SUBCUTANEOUSLY 2 MG EVERY WEEK 9 mL 3   rosuvastatin (CRESTOR) 10 MG tablet TAKE 1 TABLET BY MOUTH DAILY 60 tablet 5   No current facility-administered medications on file prior to visit.    Family History  Problem Relation Age of Onset   Heart attack Mother    Stroke Mother    Diabetes Mother    Hypertension Brother    Breast cancer Neg Hx     Social History   Socioeconomic History   Marital status: Married    Spouse name: Not on file   Number of children: Not on file   Years of education: Not on file   Highest education level: Not on file  Occupational History   Not on file  Tobacco Use   Smoking status: Never   Smokeless  tobacco: Never  Vaping Use   Vaping status: Never Used  Substance and Sexual Activity   Alcohol use: No   Drug use: No   Sexual activity: Not Currently  Other Topics Concern   Not on file  Social History Narrative   Not on file   Social Determinants of Health   Financial Resource Strain: Low Risk  (03/01/2023)   Overall Financial Resource Strain (CARDIA)    Difficulty of Paying Living Expenses: Not hard at all  Food Insecurity: No Food Insecurity (03/01/2023)   Hunger Vital Sign    Worried About Running Out of Food in the Last Year: Never true    Ran Out of Food in the Last Year: Never true  Transportation Needs: No Transportation Needs (03/01/2023)   PRAPARE - Administrator, Civil Service (Medical): No    Lack of Transportation (Non-Medical): No  Physical Activity: Insufficiently Active (03/01/2023)    Exercise Vital Sign    Days of Exercise per Week: 7 days    Minutes of Exercise per Session: 20 min  Stress: No Stress Concern Present (03/01/2023)   [employer] of Occupational Health - Occupational Stress Questionnaire    Feeling of Stress : Not at all  Social Connections: Moderately Isolated (03/01/2023)   Social Connection and Isolation Panel [NHANES]    Frequency of Communication with Friends and Family: More than three times a week    Frequency of Social Gatherings with Friends and Family: Once a week    Attends Religious Services: More than 4 times per year    Active Member of [employer] or Organizations: No    Attends Banker Meetings: Never    Marital Status: Widowed  Intimate Partner Violence: Not At Risk (03/01/2023)   Humiliation, Afraid, Rape, and Kick questionnaire    Fear of Current or Ex-Partner: No    Emotionally Abused: No    Physically Abused: No    Sexually Abused: No    Review of Systems: ROS negative except for what is noted on the assessment and plan.  Vitals:   03/22/23 1031  BP: 129/80  Pulse: 60  Temp: 98.5 F (36.9 C)  TempSrc: Oral  SpO2: 100%  Weight: 203 lb 8 oz (92.3 kg)  Height: 5\' 3"  (1.6 m)    Physical Exam Constitutional:      Appearance: Normal appearance.  HENT:     Head: Normocephalic and atraumatic.  Cardiovascular:     Rate and Rhythm: Normal rate and regular rhythm.  Pulmonary:     Effort: Pulmonary effort is normal.     Breath sounds: Normal breath sounds.  Abdominal:     General: Abdomen is flat.     Palpations: Abdomen is soft.  Musculoskeletal:     Comments: Bilateral lower extremity swelling; L>R, chronic issue per patient  Neurological:     Mental Status: She is alert.     Cranial Nerves: Cranial nerves 2-12 are intact.     Motor: Weakness present.     Gait: Gait abnormal.   Assessment & Plan:   Leg pain Patient was last seen in the clinic on 03/01/2023.  At that time, she endorsed worsening of  chronic bilateral leg pain.  She notes that the pain radiates down both her legs that is sharp in nature.  She has tried pregabalin and gabapentin the past with no success.  She was seen on 11/8 for physical therapy and believes it is too early to tell if this was helpful.  She is seeing physical therapy again on 22 November.  She has not tried duloxetine in the past, so we will begin her on this medication.  We also begin her on Voltaren gel for joint pain.  If patient's pain remains persistent at her next visit despite physical therapy, duloxetine, Voltaren gel, we will likely follow-up with MRI. - Continue PT - Begin Voltaren gel - Begin duloxetine 30 mg/day  Hypertension At her last visit, patient was hypertensive with a blood pressure 159/82.  Today, patient's blood pressure is 129/80.  She is currently on diltiazem 360, Toprol XL and benazepril.  Patient was counseled on getting her blood pressure cuff at her last visit but was unable to pick up at the store.  Encouraged patient to try again and monitor blood pressures for future visits.  She denies any headache, nausea, vomiting, or any other signs or symptoms. - Continue medication regimen  Patient seen with Dr. Henderson Newcomer, MD  [facility], PGY-1 Date 03/22/2023 Time 11:58 AM

## 2023-03-22 NOTE — Assessment & Plan Note (Signed)
At her last visit, patient was hypertensive with a blood pressure 159/82.  Today, patient's blood pressure is 129/80.  She is currently on diltiazem 360, Toprol XL and benazepril.  Patient was counseled on getting her blood pressure cuff at her last visit but was unable to pick up at the store.  Encouraged patient to try again and monitor blood pressures for future visits.  She denies any headache, nausea, vomiting, or any other signs or symptoms. - Continue medication regimen

## 2023-03-22 NOTE — Assessment & Plan Note (Signed)
Patient was last seen in the clinic on 03/01/2023.  At that time, she endorsed worsening of chronic bilateral leg pain.  She notes that the pain radiates down both her legs that is sharp in nature.  She has tried pregabalin and gabapentin the past with no success.  She was seen on 11/8 for physical therapy and believes it is too early to tell if this was helpful.  She is seeing physical therapy again on 22 November.  She has not tried duloxetine in the past, so we will begin her on this medication.  We also begin her on Voltaren gel for joint pain.  If patient's pain remains persistent at her next visit despite physical therapy, duloxetine, Voltaren gel, we will likely follow-up with MRI. - Continue PT - Begin Voltaren gel - Begin duloxetine 30 mg/day

## 2023-03-30 ENCOUNTER — Ambulatory Visit: Payer: 59 | Admitting: Physical Therapy

## 2023-03-30 ENCOUNTER — Telehealth: Payer: Self-pay | Admitting: Physical Therapy

## 2023-03-30 NOTE — Progress Notes (Signed)
 Internal Medicine Clinic Attending  I was physically present during the key portions of the resident provided service and participated in the medical decision making of patient's management care. I reviewed pertinent patient test results.  The assessment, diagnosis, and plan were formulated together and I agree with the documentation in the resident's note.  Inez Catalina, MD

## 2023-03-30 NOTE — Telephone Encounter (Signed)
Called pt re: this morning's missed appt - able to speak with her, she states she called office to cancel, is unable to make it as she has had a cold. Confirmed date/time of next appt

## 2023-04-02 NOTE — Therapy (Signed)
OUTPATIENT PHYSICAL THERAPY TREATMENT NOTE   Patient Name: Megan Fischer MRN: [id number] DOB:November 16, 1950, 72 y.o., female Today's Date: 04/03/2023  END OF SESSION:  PT End of Session - 04/03/23 0929     Visit Number 2    Number of Visits 7    Date for PT Re-Evaluation 04/27/23    Authorization Type [employer]    Authorization Time Period no auth per appt notes    Progress Note Due on Visit 10    PT Start Time 0930    PT Stop Time 1010    PT Time Calculation (min) 40 min    Activity Tolerance Patient tolerated treatment well;No increased pain    Behavior During Therapy [facility] for tasks assessed/performed              Past Medical History:  Diagnosis Date   Atrial fibrillation (HCC)    Chronic disease anemia 03/29/2011   Diabetes mellitus    Dyslipidemia    Hypertension    Obesities, morbid (HCC)    Osteoarthritis    Past Surgical History:  Procedure Laterality Date   CHOLECYSTECTOMY     REPLACEMENT TOTAL KNEE BILATERAL     SHOULDER SURGERY     RIGHT SHOULDER   TUBAL LIGATION     Patient Active Problem List   Diagnosis Date Noted   Leg pain 03/02/2023   Acute respiratory failure with hypoxia (HCC) 04/11/2021   Acute on chronic diastolic CHF (congestive heart failure) (HCC) 04/11/2021   Hypercholesterolemia 09/17/2018   Iron deficiency anemia 06/24/2017   Anemia of chronic disease 07/20/2016   Healthcare maintenance 06/09/2013   Multinodular goiter 01/20/2013   Chronic disease anemia 03/29/2011   Obesities, morbid (HCC)    Hypertension    Dyslipidemia    DM (diabetes mellitus) type II controlled with renal manifestation (HCC)    Osteoarthritis    Atrial fibrillation (HCC) 08/04/2010    PCP: Morrie Sheldon, MD  REFERRING PROVIDER: Mercie Eon, MD  REFERRING DIAG: M79.604,M79.605 (ICD-10-CM) - Pain in both lower extremities  Rationale for Evaluation and Treatment: Rehabilitation  THERAPY DIAG:  Other low back pain  Other abnormalities of gait and  mobility  Muscle weakness (generalized)  ONSET DATE: March 2024  SUBJECTIVE:                                                                                                                                                                                          Per eval - Pt denies any change in activities, gradual onset of R>L LE pain around March 2024. She describes it as aching without any N/T. Tends to start at low back and  gradually move distally into anterior thighs as it worsens. 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 No bowel/bladder canges, no saddle anesthesia. Slowly worsening over time.   SUBJECTIVE STATEMENT: 04/03/2023 Pt states she was under the weather for a couple weeks. No increase in pain after eval, feeling about the same overall. No new symptoms or updates, does note HEP seems to be getting a bit easier   PERTINENT HISTORY:  afib, HTN, CHF, DM2, anemia   PAIN:  Are you having pain: 5/10  anterior thighs BIL  Per eval -  Location/description: low back into hips and anterior thighs, R>L Best-worst over past week: 7-10/10  - aggravating factors: constant, walking, activities around apartment  - Easing factors: rest  PRECAUTIONS: cardiac hx, fall risk    WEIGHT BEARING RESTRICTIONS: No  FALLS:  Has patient fallen in last 6 months? Yes. Number of falls 6, tend to occur while standing or walking, loss of balance   LIVING ENVIRONMENT: 1 level home, alone, no steps to enter Does housework, does not drive; transportation with her brother  OCCUPATION: retired   PLOF: Independent - use of rollator since May 2024, using cane for several years before  PATIENT GOALS: feel better, get off of rollator and improve walking tolerance  NEXT MD VISIT: December 12th   OBJECTIVE:  Note: Objective measures were completed at Evaluation unless otherwise  noted.  DIAGNOSTIC FINDINGS:  No recent imaging in chart, pt denies imaging  PATIENT SURVEYS:  FOTO 39 current, 55 predicted  SCREENING FOR RED FLAGS: Red flag screening reassuring overall - bowel/bladder changes: denies - bucking/overt weakness: denies - bilateral LE numbness: denies - saddle anesthesia: denies  COGNITION: Overall cognitive status: Within functional limits for tasks assessed     SENSATION: Chronic sensory issues L LE, otherwise LT intact Negative clonus, hoffman, tromners BIL  POSTURE: tendency for lateral shift towards L, forward head, increased T spine kyphosis  PALPATION: deferred  LUMBAR ROM:   AROM eval  Flexion   Extension   Right lateral flexion   Left lateral flexion   Right rotation 75% *  Left rotation 100%   (Blank rows = not tested) (Key: WFL = within functional limits not formally assessed, * = concordant pain, s = stiffness/stretching sensation, NT = not tested)   LOWER EXTREMITY ROM:     Active  Right eval Left eval  Hip flexion    Hip extension    Hip internal rotation    Hip external rotation    Knee extension    Knee flexion    (Blank rows = not tested) (Key: WFL = within functional limits not formally assessed, * = concordant pain, s = stiffness/stretching sensation, NT = not tested)  Comments:    LOWER EXTREMITY MMT:    MMT Right eval Left eval  Hip flexion 3- * 3- *  Hip abduction (modified sitting) 4- * 4- *  Hip internal rotation    Hip external rotation    Knee flexion    Knee extension    Ankle dorsiflexion     (Blank rows = not tested) (Key: WFL = within functional limits not formally assessed, * = concordant pain, s = stiffness/stretching sensation, NT = not tested)  Comments:    LUMBAR SPECIAL TESTS:  deferred  FUNCTIONAL TESTS:  Sit to stand: requires heavy UE support from standard chair, wide BOS TUG: 21.68 sec with  rollator  GAIT: Distance walked: within clinic Assistive device utilized:  Environmental consultant - 4 wheeled Level of assistance: Modified independence Comments: reduced gait speed/cadence  TODAY'S TREATMENT:                                                                                                                              [facility] Adult PT Treatment:                                                DATE: 04/03/23 Therapeutic Exercise: Seated marches x5 BIL Seated abduction AROM x8 Seated LAQ RLE only x8 TKE seated propped on stool x8 LLE only cues for form and assist to achieve positioning STS (chair + 2 airex) UE support x5 cues for mechanics Swiss ball TKE 2x10 BIL Seated heel raises x10 Seated toe raises x10 Seated adduction iso 2x12 HEP update + education/handout   [facility] Adult PT Treatment:                                                DATE: 03/16/23 Therapeutic Exercise: Seated march x5 BIL Seated hip abduction x8 BIL (unresisted) HEP handout + education  PATIENT EDUCATION:  Education details: rationale for interventions, HEP  Person educated: Patient Education method: Explanation, Demonstration, Tactile cues, Verbal cues Education comprehension: verbalized understanding, returned demonstration, verbal cues required, tactile cues required, and needs further education    HOME EXERCISE PROGRAM: Access Code: [email or web address] URL: [email or web address] Date: 04/03/2023 Prepared by: Fransisco Hertz  Exercises - Seated March  - 2-3 x daily - 1 sets - 5 reps - Seated Hip Abduction  - 2-3 x daily - 1 sets - 8 reps - Seated Heel Toe Raises  - 2-3 x daily - 1 sets - 6 reps - Seated Quad Set  - 2-3 x daily - 1 sets - 8 reps  ASSESSMENT:  CLINICAL IMPRESSION: 04/03/2023 Pt arrives w/ 5/10 pain on NPS, no issues after eval but was under the weather for a couple weeks. Today focusing on progression of seated/standing LE exercise with emphasis on posture and quad contraction. Pt tolerates well overall without increase in pain, does have some limitations in L  knee which she attributes to chronic issues, nonpainful. No adverse events, denies any change in pain on departure. Recommend continuing along current POC in order to address relevant deficits and improve functional tolerance. Pt departs today's session in no acute distress, all voiced questions/concerns addressed appropriately from PT perspective.    Per eval - Patient is a pleasant 72 y.o. woman who was seen today for physical therapy evaluation and treatment for BIL LE pain slowly worsening since March 2024, limiting her tolerance to functional mobility and  daily tasks. In discussion with pt appears to be a lumbar component as she states symptoms tend to begin in low back and move distally as symptoms worsen. They are reproduced with lumbar rotation today and hip MMT testing. TUG score also indicative of fall risk and reduced functional mobility. Pt tolerates HEP without increase in pain, no adverse events throughout. Recommend trial of skilled PT to address aforementioned deficits with aim of improving functional tolerance and reducing pain with typical activities. Pt departs today's session in no acute distress, all voiced concerns/questions addressed appropriately from PT perspective.      OBJECTIVE IMPAIRMENTS: Abnormal gait, decreased activity tolerance, decreased balance, decreased endurance, decreased mobility, difficulty walking, decreased ROM, decreased strength, postural dysfunction, and pain.   ACTIVITY LIMITATIONS: carrying, lifting, bending, standing, transfers, and locomotion level  PARTICIPATION LIMITATIONS: meal prep, cleaning, medication management, and community activity  PERSONAL FACTORS: Age, Time since onset of injury/illness/exacerbation, and 3+ comorbidities: afib, HTN, CHF, DM2, anemia  are also affecting patient's functional outcome.   REHAB POTENTIAL: Fair given comorbidities  CLINICAL DECISION MAKING: Evolving/moderate complexity  EVALUATION COMPLEXITY:  Moderate   GOALS: Goals reviewed with patient? Yes  SHORT TERM GOALS: Target date: 04/06/2023 Pt will demonstrate appropriate understanding and performance of initially prescribed HEP in order to facilitate improved independence with management of symptoms.  Baseline: HEP provided on eval Goal status: INITIAL   2. Pt will score greater than or equal to 42 on FOTO in order to demonstrate improved perception of function due to symptoms.  Baseline: 39  Goal status: INITIAL   LONG TERM GOALS: Target date: 04/27/2023 Pt will score 55 or greater on FOTO in order to demonstrate improved perception of function due to symptoms.  Baseline: 39 Goal status: INITIAL  2.  Pt will demonstrate at least 4/5 hip flexion MMTin order to facilitate improved tolerance to functional mobility. Baseline: see MMT chart above Goal status: INITIAL  3. Pt will report at least 50% decrease in overall pain levels in past week in order to facilitate improved tolerance to basic ADLs/mobility.   Baseline: 7-10/10  Goal status: INITIAL    4. Pt will be able to perform TUG in less than or equal to 15 sec in order to indicate reduced risk of falling (cutoff score for fall risk 13.5 sec in community dwelling older adults per [facility] et al, 2000)  Baseline: 21 sec w/ rollator  Goal status: INITIAL    PLAN:  PT FREQUENCY: 1-2x/week  PT DURATION: 6 weeks  PLANNED INTERVENTIONS: 97164- PT Re-evaluation, 97110-Therapeutic exercises, 97530- Therapeutic activity, 97112- Neuromuscular re-education, 97535- Self Care, 78295- Manual therapy, [phone]- Gait training, Patient/Family education, Balance training, Stair training, Taping, Joint mobilization, Spinal mobilization, Cryotherapy, and Moist heat.  PLAN FOR NEXT SESSION: Review/update HEP PRN. Work on [employer] exercises as appropriate with emphasis on lumbopelvic stability and endurance, functional mobility tolerance. Symptom modification strategies as  indicated/appropriate.    Ashley Murrain PT, DPT 04/03/2023 10:15 AM

## 2023-04-03 ENCOUNTER — Ambulatory Visit: Payer: 59 | Admitting: Physical Therapy

## 2023-04-03 ENCOUNTER — Encounter: Payer: Self-pay | Admitting: Physical Therapy

## 2023-04-03 DIAGNOSIS — M5459 Other low back pain: Secondary | ICD-10-CM

## 2023-04-03 DIAGNOSIS — M6281 Muscle weakness (generalized): Secondary | ICD-10-CM

## 2023-04-03 DIAGNOSIS — R2689 Other abnormalities of gait and mobility: Secondary | ICD-10-CM

## 2023-04-09 ENCOUNTER — Ambulatory Visit: Payer: 59 | Admitting: Physical Therapy

## 2023-04-17 ENCOUNTER — Telehealth: Payer: Self-pay | Admitting: Physical Therapy

## 2023-04-17 ENCOUNTER — Ambulatory Visit: Payer: 59 | Attending: Internal Medicine | Admitting: Physical Therapy

## 2023-04-17 NOTE — Therapy (Incomplete)
OUTPATIENT PHYSICAL THERAPY TREATMENT NOTE   Patient Name: Megan Fischer MRN: [id number] DOB:02/11/1951, 72 y.o., female Today's Date: 04/17/2023  END OF SESSION:     Past Medical History:  Diagnosis Date   Atrial fibrillation (HCC)    Chronic disease anemia 03/29/2011   Diabetes mellitus    Dyslipidemia    Hypertension    Obesities, morbid (HCC)    Osteoarthritis    Past Surgical History:  Procedure Laterality Date   CHOLECYSTECTOMY     REPLACEMENT TOTAL KNEE BILATERAL     SHOULDER SURGERY     RIGHT SHOULDER   TUBAL LIGATION     Patient Active Problem List   Diagnosis Date Noted   Leg pain 03/02/2023   Acute respiratory failure with hypoxia (HCC) 04/11/2021   Acute on chronic diastolic CHF (congestive heart failure) (HCC) 04/11/2021   Hypercholesterolemia 09/17/2018   Iron deficiency anemia 06/24/2017   Anemia of chronic disease 07/20/2016   Healthcare maintenance 06/09/2013   Multinodular goiter 01/20/2013   Chronic disease anemia 03/29/2011   Obesities, morbid (HCC)    Hypertension    Dyslipidemia    DM (diabetes mellitus) type II controlled with renal manifestation (HCC)    Osteoarthritis    Atrial fibrillation (HCC) 08/04/2010    PCP: Morrie Sheldon, MD  REFERRING PROVIDER: Mercie Eon, MD  REFERRING DIAG: M79.604,M79.605 (ICD-10-CM) - Pain in both lower extremities  Rationale for Evaluation and Treatment: Rehabilitation  THERAPY DIAG:  No diagnosis found.  ONSET DATE: March 2024  SUBJECTIVE:                                                                                                                                                                                          Per eval - Pt denies any change in activities, gradual onset of R>L LE pain around March 2024. She describes it as aching without any N/T. Tends to start at low back and gradually move distally into anterior thighs as it worsens. 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 No bowel/bladder canges, no saddle anesthesia. Slowly worsening over time.   SUBJECTIVE STATEMENT: 04/17/2023 ***  *** Pt states she was under the weather for a couple weeks. No increase in pain after eval, feeling about the same overall. No new symptoms or updates, does note HEP seems to be getting a bit easier   PERTINENT HISTORY:  afib, HTN, CHF, DM2, anemia   PAIN:  Are you having pain: 5/10  anterior thighs BIL  Per eval -  Location/description: low back into  hips and anterior thighs, R>L Best-worst over past week: 7-10/10  - aggravating factors: constant, walking, activities around apartment  - Easing factors: rest  PRECAUTIONS: cardiac hx, fall risk    WEIGHT BEARING RESTRICTIONS: No  FALLS:  Has patient fallen in last 6 months? Yes. Number of falls 6, tend to occur while standing or walking, loss of balance   LIVING ENVIRONMENT: 1 level home, alone, no steps to enter Does housework, does not drive; transportation with her brother  OCCUPATION: retired   PLOF: Independent - use of rollator since May 2024, using cane for several years before  PATIENT GOALS: feel better, get off of rollator and improve walking tolerance  NEXT MD VISIT: December 12th   OBJECTIVE:  Note: Objective measures were completed at Evaluation unless otherwise noted.  DIAGNOSTIC FINDINGS:  No recent imaging in chart, pt denies imaging  PATIENT SURVEYS:  FOTO 39 current, 55 predicted  SCREENING FOR RED FLAGS: Red flag screening reassuring overall - bowel/bladder changes: denies - bucking/overt weakness: denies - bilateral LE numbness: denies - saddle anesthesia: denies  COGNITION: Overall cognitive status: Within functional limits for tasks assessed     SENSATION: Chronic sensory issues L LE, otherwise LT intact Negative clonus, hoffman, tromners BIL  POSTURE:  tendency for lateral shift towards L, forward head, increased T spine kyphosis  PALPATION: deferred  LUMBAR ROM:   AROM eval  Flexion   Extension   Right lateral flexion   Left lateral flexion   Right rotation 75% *  Left rotation 100%   (Blank rows = not tested) (Key: WFL = within functional limits not formally assessed, * = concordant pain, s = stiffness/stretching sensation, NT = not tested)   LOWER EXTREMITY ROM:     Active  Right eval Left eval  Hip flexion    Hip extension    Hip internal rotation    Hip external rotation    Knee extension    Knee flexion    (Blank rows = not tested) (Key: WFL = within functional limits not formally assessed, * = concordant pain, s = stiffness/stretching sensation, NT = not tested)  Comments:    LOWER EXTREMITY MMT:    MMT Right eval Left eval  Hip flexion 3- * 3- *  Hip abduction (modified sitting) 4- * 4- *  Hip internal rotation    Hip external rotation    Knee flexion    Knee extension    Ankle dorsiflexion     (Blank rows = not tested) (Key: WFL = within functional limits not formally assessed, * = concordant pain, s = stiffness/stretching sensation, NT = not tested)  Comments:    LUMBAR SPECIAL TESTS:  deferred  FUNCTIONAL TESTS:  Sit to stand: requires heavy UE support from standard chair, wide BOS TUG: 21.68 sec with rollator  GAIT: Distance walked: within clinic Assistive device utilized: Walker - 4 wheeled Level of assistance: Modified independence Comments: reduced gait speed/cadence  TODAY'S TREATMENT:  [facility] Adult PT Treatment:                                                DATE: 04/17/23 Therapeutic Exercise: *** Manual Therapy: *** Neuromuscular re-ed: *** Therapeutic Activity: *** Modalities: *** Self Care: ***    [facility] Adult PT Treatment:                                                 DATE: 04/03/23 Therapeutic Exercise: Seated marches x5 BIL Seated abduction AROM x8 Seated LAQ RLE only x8 TKE seated propped on stool x8 LLE only cues for form and assist to achieve positioning STS (chair + 2 airex) UE support x5 cues for mechanics Swiss ball TKE 2x10 BIL Seated heel raises x10 Seated toe raises x10 Seated adduction iso 2x12 HEP update + education/handout   [facility] Adult PT Treatment:                                                DATE: 03/16/23 Therapeutic Exercise: Seated march x5 BIL Seated hip abduction x8 BIL (unresisted) HEP handout + education  PATIENT EDUCATION:  Education details: rationale for interventions, HEP  Person educated: Patient Education method: Explanation, Demonstration, Tactile cues, Verbal cues Education comprehension: verbalized understanding, returned demonstration, verbal cues required, tactile cues required, and needs further education    HOME EXERCISE PROGRAM: Access Code: [email or web address] URL: [email or web address] Date: 04/03/2023 Prepared by: Fransisco Hertz  Exercises - Seated March  - 2-3 x daily - 1 sets - 5 reps - Seated Hip Abduction  - 2-3 x daily - 1 sets - 8 reps - Seated Heel Toe Raises  - 2-3 x daily - 1 sets - 6 reps - Seated Quad Set  - 2-3 x daily - 1 sets - 8 reps  ASSESSMENT:  CLINICAL IMPRESSION: 04/17/2023 ***  *** Pt arrives w/ 5/10 pain on NPS, no issues after eval but was under the weather for a couple weeks. Today focusing on progression of seated/standing LE exercise with emphasis on posture and quad contraction. Pt tolerates well overall without increase in pain, does have some limitations in L knee which she attributes to chronic issues, nonpainful. No adverse events, denies any change in pain on departure. Recommend continuing along current POC in order to address relevant deficits and improve functional tolerance. Pt departs today's session in no acute distress, all voiced  questions/concerns addressed appropriately from PT perspective.    Per eval - Patient is a pleasant 72 y.o. woman who was seen today for physical therapy evaluation and treatment for BIL LE pain slowly worsening since March 2024, limiting her tolerance to functional mobility and daily tasks. In discussion with pt appears to be a lumbar component as she states symptoms tend to begin in low back and move distally as symptoms worsen. They are reproduced with lumbar rotation today and hip MMT testing. TUG score also indicative of fall risk and reduced functional mobility. Pt tolerates HEP without increase in pain, no adverse events throughout. Recommend trial of skilled PT to address aforementioned deficits with aim of improving functional  tolerance and reducing pain with typical activities. Pt departs today's session in no acute distress, all voiced concerns/questions addressed appropriately from PT perspective.      OBJECTIVE IMPAIRMENTS: Abnormal gait, decreased activity tolerance, decreased balance, decreased endurance, decreased mobility, difficulty walking, decreased ROM, decreased strength, postural dysfunction, and pain.   ACTIVITY LIMITATIONS: carrying, lifting, bending, standing, transfers, and locomotion level  PARTICIPATION LIMITATIONS: meal prep, cleaning, medication management, and community activity  PERSONAL FACTORS: Age, Time since onset of injury/illness/exacerbation, and 3+ comorbidities: afib, HTN, CHF, DM2, anemia  are also affecting patient's functional outcome.   REHAB POTENTIAL: Fair given comorbidities  CLINICAL DECISION MAKING: Evolving/moderate complexity  EVALUATION COMPLEXITY: Moderate   GOALS: Goals reviewed with patient? Yes  SHORT TERM GOALS: Target date: 04/06/2023 Pt will demonstrate appropriate understanding and performance of initially prescribed HEP in order to facilitate improved independence with management of symptoms.  Baseline: HEP provided on  eval 04/17/23: *** Goal status: ***   2. Pt will score greater than or equal to 42 on FOTO in order to demonstrate improved perception of function due to symptoms.  Baseline: 39  04/17/23: deferred given visit 3  Goal status: ONGOING  LONG TERM GOALS: Target date: 04/27/2023 Pt will score 55 or greater on FOTO in order to demonstrate improved perception of function due to symptoms.  Baseline: 39 Goal status: INITIAL  2.  Pt will demonstrate at least 4/5 hip flexion MMTin order to facilitate improved tolerance to functional mobility. Baseline: see MMT chart above Goal status: INITIAL  3. Pt will report at least 50% decrease in overall pain levels in past week in order to facilitate improved tolerance to basic ADLs/mobility.   Baseline: 7-10/10  Goal status: INITIAL    4. Pt will be able to perform TUG in less than or equal to 15 sec in order to indicate reduced risk of falling (cutoff score for fall risk 13.5 sec in community dwelling older adults per [facility] et al, 2000)  Baseline: 21 sec w/ rollator  Goal status: INITIAL    PLAN:  PT FREQUENCY: 1-2x/week  PT DURATION: 6 weeks  PLANNED INTERVENTIONS: 97164- PT Re-evaluation, 97110-Therapeutic exercises, 97530- Therapeutic activity, 97112- Neuromuscular re-education, 97535- Self Care, 16109- Manual therapy, [phone]- Gait training, Patient/Family education, Balance training, Stair training, Taping, Joint mobilization, Spinal mobilization, Cryotherapy, and Moist heat.  PLAN FOR NEXT SESSION: Review/update HEP PRN. Work on [employer] exercises as appropriate with emphasis on lumbopelvic stability and endurance, functional mobility tolerance. Symptom modification strategies as indicated/appropriate.    Ashley Murrain PT, DPT 04/17/2023 8:39 AM

## 2023-04-17 NOTE — Telephone Encounter (Signed)
Called pt re: this morning's missed appt - she states she forgot about today's appt. No further appts on the schedule, pt states she will call office to schedule additional appts. Advised on attendance policy, pt denies further questions/concerns

## 2023-04-18 ENCOUNTER — Other Ambulatory Visit: Payer: Self-pay | Admitting: Nephrology

## 2023-04-18 DIAGNOSIS — N1832 Chronic kidney disease, stage 3b: Secondary | ICD-10-CM

## 2023-04-19 ENCOUNTER — Encounter: Payer: 59 | Admitting: Internal Medicine

## 2023-04-20 ENCOUNTER — Ambulatory Visit
Admission: RE | Admit: 2023-04-20 | Discharge: 2023-04-20 | Disposition: A | Discharge status: Home / Self Care | Payer: 59 | Source: Ambulatory Visit | Attending: Nephrology | Admitting: Nephrology

## 2023-04-20 DIAGNOSIS — N1832 Chronic kidney disease, stage 3b: Secondary | ICD-10-CM

## 2023-04-27 ENCOUNTER — Ambulatory Visit (HOSPITAL_COMMUNITY): Payer: 59

## 2023-04-27 ENCOUNTER — Ambulatory Visit (INDEPENDENT_AMBULATORY_CARE_PROVIDER_SITE_OTHER): Payer: 59 | Admitting: Student

## 2023-04-27 VITALS — BP 151/97 | HR 69 | Temp 98.3°F | Ht 63.0 in | Wt 198.5 lb

## 2023-04-27 DIAGNOSIS — M79604 Pain in right leg: Secondary | ICD-10-CM | POA: Diagnosis not present

## 2023-04-27 DIAGNOSIS — I4891 Unspecified atrial fibrillation: Secondary | ICD-10-CM

## 2023-04-27 DIAGNOSIS — I10 Essential (primary) hypertension: Secondary | ICD-10-CM

## 2023-04-27 DIAGNOSIS — G8929 Other chronic pain: Secondary | ICD-10-CM

## 2023-04-27 DIAGNOSIS — E1122 Type 2 diabetes mellitus with diabetic chronic kidney disease: Secondary | ICD-10-CM

## 2023-04-27 DIAGNOSIS — M4807 Spinal stenosis, lumbosacral region: Secondary | ICD-10-CM

## 2023-04-27 DIAGNOSIS — M79605 Pain in left leg: Secondary | ICD-10-CM

## 2023-04-27 MED ORDER — BENAZEPRIL HCL 20 MG PO TABS: 20.0000 mg | ORAL_TABLET | Freq: Every day | ORAL | 11 refills | Status: DC

## 2023-04-27 MED ORDER — DULOXETINE HCL 60 MG PO CPEP: 60.0000 mg | ORAL_CAPSULE | Freq: Every day | ORAL | 3 refills | Status: DC

## 2023-04-27 MED ORDER — METHOCARBAMOL 500 MG PO TABS: 1000.0000 mg | ORAL_TABLET | Freq: Four times a day (QID) | ORAL | 2 refills | Status: DC | PRN

## 2023-04-27 MED ORDER — METOPROLOL SUCCINATE ER 50 MG PO TB24: 50.0000 mg | ORAL_TABLET | Freq: Every day | ORAL | 2 refills | Status: DC

## 2023-04-27 NOTE — Progress Notes (Unsigned)
CC: Routine Follow Up   HPI:  Megan Fischer is a 72 y.o. female with pertinent PMH of A-fib on Eliquis, hypertension, HFpEF, T2DM, and iron deficiency anemia who presents to the clinic for follow-up. Please see assessment and plan below for further details.  Past Medical History:  Diagnosis Date   Atrial fibrillation (HCC)    Chronic disease anemia 03/29/2011   Diabetes mellitus    Dyslipidemia    Hypertension    Obesities, morbid (HCC)    Osteoarthritis     Current Outpatient Medications  Medication Instructions   apixaban (ELIQUIS) 5 MG TABS tablet TAKE 1 TABLET BY MOUTH TWICE  DAILY NEEDS CARDIOLOGY  APPOINTMENT FOR ELIQUIS REFILLS, CALL OFFICE   benazepril (LOTENSIN) 20 mg, Oral, Daily   Blood Glucose Monitoring Suppl (ONETOUCH VERIO IQ SYSTEM) W/DEVICE KIT Use to check blood sugar 2 times per day dx code 250.02   CALCIUM PO Oral, 2 times daily,     diclofenac Sodium (VOLTAREN ARTHRITIS PAIN) 2 g, Topical, 4 times daily   diltiazem (CARDIZEM CD) 360 MG 24 hr capsule TAKE 1 CAPSULE BY MOUTH DAILY   DULoxetine (CYMBALTA) 60 mg, Oral, Daily   empagliflozin (JARDIANCE) 25 mg, Oral, Daily before breakfast   ergocalciferol (VITAMIN D2) 50,000 Units, Oral, Weekly,     ferrous sulfate 325 mg, Oral, 2 times daily   furosemide (LASIX) 40 mg, Oral, Daily   gabapentin (NEURONTIN) 600 mg, Oral, 3 times daily   HYDROcodone-acetaminophen (NORCO/VICODIN) 5-325 MG tablet 0.5-1 tablets, Oral, 2 times daily   metFORMIN (GLUCOPHAGE-XR) 500 mg, Oral, 2 times daily between meals   methimazole (TAPAZOLE) 5 mg, Oral, Daily   methocarbamol (ROBAXIN) 1,000 mg, Oral, Every 6 hours PRN   metoprolol succinate (TOPROL-XL) 50 mg, Oral, Daily, Take with or immediately following a meal.   ONETOUCH VERIO test strip USE AS DIRECTED TO CHECK  BLOOD SUGAR TWICE DAILY   OZEMPIC, 2 MG/DOSE, 8 MG/3ML SOPN INJECT SUBCUTANEOUSLY 2 MG EVERY WEEK   rosuvastatin (CRESTOR) 10 MG tablet TAKE 1 TABLET BY MOUTH DAILY      Review of Systems:   Pertinent items noted in HPI and/or A&P.  Physical Exam:  Vitals:   04/27/23 0909 04/27/23 0946  BP: (!) 164/97 (!) 151/97  Pulse:  69  Temp: 98.3 F (36.8 C)   TempSrc: Oral   SpO2: 100%   Weight: 198 lb 8 oz (90 kg)   Height: 5\' 3"  (1.6 m)     Constitutional: Well-appearing elderly female. In no acute distress. HEENT: Normocephalic, atraumatic, Sclera non-icteric, PERRL, EOM intact Cardio:Regular rate and rhythm. 2+ bilateral radial pulses. Pulm: Normal work of breathing on room air. IEP:PIRJJOAC for extremity edema. Skin:Warm and dry. Neuro:Alert and oriented x3. No focal deficit noted. Psych:Pleasant mood and affect.   Assessment & Plan:   Leg pain Patient continues to have bothersome bilateral thigh pain with a history of moderate to severe spinal and foraminal stenosis from L3-S1 and bilateral sacroiliac arthritis on previous CT.  Pain is not wholly consistent with radicular pain or neuropathic pain.  She has been working with PT without much benefit but is only done a couple sessions.  Duloxetine 30 mg daily has not made much of a difference.  Discussed addition of muscle relaxer and increasing duloxetine as well as taking plain films of her lumbar spine and hips.  Due to her history of stenosis I think an MRI would likely be needed if this does not improve. - Increase duloxetine to  60 mg daily and add Robaxin 1000 mg every 6 hours as needed - Bilateral hip and lumbar x-ray  Hypertension Blood pressure 151/97 however patient has not taken her antihypertensives this morning and is in pain. - Continue benazepril 20 mg daily, diltiazem 360 mg daily, metoprolol succinate 50 mg daily    Patient discussed with Dr. Theodosia Paling, DO [facility] Internal Medicine Resident PGY-2 Clinic Phone: [phone] Pager: [phone]

## 2023-04-27 NOTE — Patient Instructions (Signed)
  Thank you, [name], for allowing [place] to provide your care today.  Today I am going to increase your duloxetine to 60 mg nightly and add Robaxin 1000 mg every 6 hours as needed for pain and muscle spasms.  We will also send you for some x-rays of your low back and hips.  If there is any abnormalities in these the need a closer look we will send you for an MRI.  If there is any significant abnormality that needs to be intervened on by a surgeon we will refer you back to orthopedic surgery.   I have ordered the following medication/changed the following medications:   Start the following medications: Meds ordered this encounter  Medications   DULoxetine (CYMBALTA) 60 MG capsule    Sig: Take 1 capsule (60 mg total) by mouth daily.    Dispense:  30 capsule    Refill:  3   methocarbamol (ROBAXIN) 500 MG tablet    Sig: Take 2 tablets (1,000 mg total) by mouth every 6 (six) hours as needed for muscle spasms.    Dispense:  240 tablet    Refill:  2   benazepril (LOTENSIN) 20 MG tablet    Sig: Take 1 tablet (20 mg total) by mouth daily.    Dispense:  30 tablet    Refill:  11   metoprolol succinate (TOPROL-XL) 50 MG 24 hr tablet    Sig: Take 1 tablet (50 mg total) by mouth daily. Take with or immediately following a meal.    Dispense:  30 tablet    Refill:  2      Follow up:  6-8 weeks     Remember:     Should you have any questions or concerns please call the internal medicine clinic at [phone].     Rocky Morel, DO [facility]

## 2023-04-29 ENCOUNTER — Other Ambulatory Visit: Payer: Self-pay | Admitting: Cardiovascular Disease

## 2023-04-29 DIAGNOSIS — I4891 Unspecified atrial fibrillation: Secondary | ICD-10-CM

## 2023-04-30 NOTE — Assessment & Plan Note (Signed)
Blood pressure 151/97 however patient has not taken her antihypertensives this morning and is in pain. - Continue benazepril 20 mg daily, diltiazem 360 mg daily, metoprolol succinate 50 mg daily

## 2023-04-30 NOTE — Telephone Encounter (Signed)
Prescription refill request for Eliquis received. Indication:afib Last office visit:needs appt Scr:1.18  10/24 Age: 72 Weight:90  kg  Prescription refilled

## 2023-04-30 NOTE — Assessment & Plan Note (Signed)
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  Pain is not wholly consistent with radicular pain or neuropathic pain.  She has been working with PT without much benefit but is only done a couple sessions.  Duloxetine 30 mg daily has not made much of a difference.  Discussed addition of muscle relaxer and increasing duloxetine as well as taking plain films of her lumbar spine and hips.  Due to her history of stenosis I think an MRI would likely be needed if this does not improve. - Increase duloxetine to 60 mg daily and add Robaxin 1000 mg every 6 hours as needed - Bilateral hip and lumbar x-ray

## 2023-04-30 NOTE — Progress Notes (Signed)
Internal Medicine Clinic Attending  Case discussed with the resident at the time of the visit.  We reviewed the resident's history and exam and pertinent patient test results.  I agree with the assessment, diagnosis, and plan of care documented in the resident's note.  

## 2023-05-08 ENCOUNTER — Ambulatory Visit (HOSPITAL_COMMUNITY)
Admission: RE | Admit: 2023-05-08 | Discharge: 2023-05-08 | Disposition: A | Discharge status: Home / Self Care | Payer: 59 | Source: Ambulatory Visit | Attending: Internal Medicine | Admitting: Internal Medicine

## 2023-05-08 ENCOUNTER — Other Ambulatory Visit: Payer: Self-pay | Admitting: Student

## 2023-05-08 DIAGNOSIS — M79605 Pain in left leg: Secondary | ICD-10-CM | POA: Insufficient documentation

## 2023-05-08 DIAGNOSIS — M545 Low back pain, unspecified: Secondary | ICD-10-CM

## 2023-05-08 DIAGNOSIS — I10 Essential (primary) hypertension: Secondary | ICD-10-CM

## 2023-05-08 DIAGNOSIS — M4807 Spinal stenosis, lumbosacral region: Secondary | ICD-10-CM

## 2023-05-08 DIAGNOSIS — G8929 Other chronic pain: Secondary | ICD-10-CM | POA: Diagnosis present

## 2023-05-08 DIAGNOSIS — M79604 Pain in right leg: Secondary | ICD-10-CM | POA: Diagnosis present

## 2023-05-08 DIAGNOSIS — E1122 Type 2 diabetes mellitus with diabetic chronic kidney disease: Secondary | ICD-10-CM

## 2023-05-08 DIAGNOSIS — I4891 Unspecified atrial fibrillation: Secondary | ICD-10-CM

## 2023-05-11 NOTE — Progress Notes (Signed)
 Called and spoke to the patient about the results of her hip and lumbar spine x-rays.  This shows severe degenerative disc disease and severe bilateral hip osteoarthritis.  She does have an orthopedic doctor that she sees at [facility] and has an upcoming appointment this month.  I recommended that she call orthopedic office and asked them to look at the x-rays as well prior to her visit.  Discussed all pain management options available to us  including Tylenol , NSAIDs, gabapentin , muscle relaxers, topical therapies, and opiates.  None of these have particularly helped her pain and she does not want a refill on a previous Norco prescription.

## 2023-06-05 ENCOUNTER — Other Ambulatory Visit: Payer: Self-pay | Admitting: Cardiovascular Disease

## 2023-06-05 DIAGNOSIS — I4891 Unspecified atrial fibrillation: Secondary | ICD-10-CM

## 2023-06-05 NOTE — Telephone Encounter (Signed)
Prescription refill request for Eliquis received. Indication:afib Last office visit:needs appt Scr:1.18  10/24 Age: 73 Weight:90  kg  Prescription refilled

## 2023-06-14 ENCOUNTER — Other Ambulatory Visit: Payer: Self-pay

## 2023-06-14 DIAGNOSIS — E0590 Thyrotoxicosis, unspecified without thyrotoxic crisis or storm: Secondary | ICD-10-CM

## 2023-06-14 DIAGNOSIS — D508 Other iron deficiency anemias: Secondary | ICD-10-CM

## 2023-06-18 ENCOUNTER — Other Ambulatory Visit: Payer: 59

## 2023-06-19 ENCOUNTER — Encounter: Payer: Self-pay | Admitting: Endocrinology

## 2023-06-19 ENCOUNTER — Other Ambulatory Visit: Payer: 59

## 2023-06-19 LAB — CBC
HCT: 41.4 % (ref 35.0–45.0)
Hemoglobin: 13.4 g/dL (ref 11.7–15.5)
MCH: 28.6 pg (ref 27.0–33.0)
MCHC: 32.4 g/dL (ref 32.0–36.0)
MCV: 88.5 fL (ref 80.0–100.0)
MPV: 10 fL (ref 7.5–12.5)
Platelets: 235 10*3/uL (ref 140–400)
RBC: 4.68 10*6/uL (ref 3.80–5.10)
RDW: 14.3 % (ref 11.0–15.0)
WBC: 4 10*3/uL (ref 3.8–10.8)

## 2023-06-19 LAB — T3, FREE: T3, Free: 3.4 pg/mL (ref 2.3–4.2)

## 2023-06-19 LAB — TSH: TSH: 0.37 m[IU]/L — ABNORMAL LOW (ref 0.40–4.50)

## 2023-06-19 LAB — T4, FREE: Free T4: 1.5 ng/dL (ref 0.8–1.8)

## 2023-06-22 ENCOUNTER — Ambulatory Visit: Payer: 59 | Admitting: Endocrinology

## 2023-06-22 ENCOUNTER — Encounter: Payer: Self-pay | Admitting: Endocrinology

## 2023-06-22 VITALS — BP 136/80 | HR 94 | Resp 20 | Ht 63.0 in | Wt 191.2 lb

## 2023-06-22 DIAGNOSIS — Z7984 Long term (current) use of oral hypoglycemic drugs: Secondary | ICD-10-CM

## 2023-06-22 DIAGNOSIS — Z7985 Long-term (current) use of injectable non-insulin antidiabetic drugs: Secondary | ICD-10-CM

## 2023-06-22 DIAGNOSIS — E042 Nontoxic multinodular goiter: Secondary | ICD-10-CM

## 2023-06-22 DIAGNOSIS — E0590 Thyrotoxicosis, unspecified without thyrotoxic crisis or storm: Secondary | ICD-10-CM

## 2023-06-22 DIAGNOSIS — E118 Type 2 diabetes mellitus with unspecified complications: Secondary | ICD-10-CM

## 2023-06-22 LAB — POCT GLYCOSYLATED HEMOGLOBIN (HGB A1C): Hemoglobin A1C: 7 % — AB (ref 4.0–5.6)

## 2023-06-22 MED ORDER — METHIMAZOLE 5 MG PO TABS
5.0000 mg | ORAL_TABLET | Freq: Every day | ORAL | 3 refills | Status: DC
Start: 2023-06-22 — End: 2024-02-20

## 2023-06-22 MED ORDER — OZEMPIC (2 MG/DOSE) 8 MG/3ML ~~LOC~~ SOPN: PEN_INJECTOR | SUBCUTANEOUS | 3 refills | Status: DC

## 2023-06-22 NOTE — Progress Notes (Signed)
 Outpatient Endocrinology Note [place] Kitrina Maurin, MD  06/22/23  Patient's Name: Megan Fischer    DOB: 10-15-1950    MRN: [id number]                                                    REASON OF VISIT: Follow up of type 2 diabetes mellitus  PCP: Morrie Sheldon, MD  HISTORY OF PRESENT ILLNESS:   Megan Fischer is a 73 y.o. old female with past medical history listed below, is here for follow up of type 2 diabetes mellitus /subclinical hyperthyroidism / multinodular goiter.   Pertinent Diabetes History: Patient was diagnosed with type 2 diabetes mellitus in 2000.  She had been on various regimen of diabetes treatment in the past including [place] and generally had done better with GLP-1 receptor agonist medications.  She used to be on low-dose of glimepiride which was stopped due to hypoglycemia in November 2015.  She had been on Victoza since 2013 until 2019.  Chronic Diabetes Complications : Retinopathy: left yes. Last ophthalmology exam was done on annually.  Nephropathy: CKD present, on benzepril Peripheral neuropathy: no Coronary artery disease: no Stroke: no  Relevant comorbidities and cardiovascular risk factors: Obesity: yes Body mass index is 33.87 kg/m.  Hypertension: yes, following with cardiology. Hyperlipidemia. Yes, on a statin.  Current / Home Diabetic regimen includes: Jardiance 25 mg daily. Metformin extended release 500 mg two times a day.  Ozempic 2 mg weekly.  Prior diabetic medications: Victoza Amaryl/glimepiride is stopped due to hypoglycemia in 2015.  Glycemic data:   One Touch Verio flex glucometer download from January 31 to February 14, average blood sugar 121.  Highest blood sugar 150, lowest 94.  She has been checking mostly 2 times a day in the morning fasting and in the evening at bedtime.  Mostly acceptable blood sugar.  Hypoglycemia: Patient has no hypoglycemic episodes. Patient has hypoglycemia awareness.  Factors modifying glucose control: 1.   Diabetic diet assessment: 3 meals a day.  2.  Staying active or exercising: No formal exercise  3.  Medication compliance: compliant all of the time.  # Multiple thyroid nodules /multinodular goiter /subclinical hyperthyroidism: Patient has longstanding history of multinodular goiter from 2006, per office note.  She has mildly low TSH frequently with normal free T4 and free T3 for several years. -RAI uptake and scan in June 27, 2004 : There was slightly enlarged thyroid with area of relatively low uptake in the somewhat enlarged area of the right mid lobe of the thyroid.  Uptake was in the lower normal range. -Ultrasound thyroid in October 20, 2013 showed a dominant 5.1 cm heterogeneous nodule is noted in the midportion of the right lobe of the thyroid, this was corresponding to the relative photopenic area in the right lobe seen on the prior nuclear exam from 2006.  Multiple smaller less than 1 cm nodules are noted scattered throughout the right lobe of the thyroid.  A 3.3 cm nodule was noted in the midportion of the left lobe of the thyroid. -Patient had FNA of bilateral dominant thyroid nodules in November 13, 2013, cytology benign consistent with nonneoplastic goiter. -Patient has no follow-up ultrasound after 2015.  # In September 2024, patient remained subclinical hyperthyroid and started on methimazole 5 mg daily.  # She has history of atrial fibrillation currently on Eliquis,  following with cardiology.  Interval history  Diabetes regimen as reviewed and noted above.  Glucometer data as reviewed and noted above.  Hemoglobin A1c today 7%.  She has been taking methimazole 5 mg daily.  Reports compliance.  No any recent illness or GI upset.  She denies palpitation or heat intolerance.  Overall feeling usual health.  She denies neck discomfort or any dysphagia.  Recent thyroid lab with mildly low TSH with normal free T4 and free T3.  No other complaints today.   Latest Reference Range & Units  06/19/23 09:31  TSH 0.40 - 4.50 mIU/L 0.37 (L)  Triiodothyronine,Free,Serum 2.3 - 4.2 pg/mL 3.4  T4,Free(Direct) 0.8 - 1.8 ng/dL 1.5  (L): Data is abnormally low  REVIEW OF SYSTEMS As per history of present illness.   PAST MEDICAL HISTORY: Past Medical History:  Diagnosis Date   Atrial fibrillation (HCC)    Chronic disease anemia 03/29/2011   Diabetes mellitus    Dyslipidemia    Hypertension    Obesities, morbid (HCC)    Osteoarthritis     PAST SURGICAL HISTORY: Past Surgical History:  Procedure Laterality Date   CHOLECYSTECTOMY     REPLACEMENT TOTAL KNEE BILATERAL     SHOULDER SURGERY     RIGHT SHOULDER   TUBAL LIGATION      ALLERGIES: Allergies  Allergen Reactions   Aspirin     Other reaction(s): Unknown   Codeine    Fentanyl Other (See Comments)    hypoxia   Oxycodone Hcl    Penicillins    Chlorhexidine Gluconate Rash    FAMILY HISTORY:  Family History  Problem Relation Age of Onset   Heart attack Mother    Stroke Mother    Diabetes Mother    Hypertension Brother    Breast cancer Neg Hx     SOCIAL HISTORY: Social History   Socioeconomic History   Marital status: Married    Spouse name: Not on file   Number of children: Not on file   Years of education: Not on file   Highest education level: Not on file  Occupational History   Not on file  Tobacco Use   Smoking status: Never   Smokeless tobacco: Never  Vaping Use   Vaping status: Never Used  Substance and Sexual Activity   Alcohol use: No   Drug use: No   Sexual activity: Not Currently  Other Topics Concern   Not on file  Social History Narrative   Not on file   Social Drivers of Health   Financial Resource Strain: Low Risk  (06/20/2023)   Received from [facility]   Overall Financial Resource Strain (CARDIA)    Difficulty of Paying Living Expenses: Not hard at all  Food Insecurity: No Food Insecurity (06/20/2023)   [facility]    Hunger Vital Sign    Worried About Running Out of Food in the Last Year: Never true    Ran Out of Food in the Last Year: Never true  Transportation Needs: No Transportation Needs (06/20/2023)   Received [facility] - Transportation    In the past 12 months, has lack of transportation kept you from medical appointments or from getting medications?: No    Lack of Transportation (Non-Medical): No  Physical Activity: Insufficiently Active (03/01/2023)   Exercise Vital Sign    Days of Exercise per Week: 7 days    Minutes of Exercise per Session: 20 min  Stress: No Stress Concern Present (03/01/2023)   [employer] of Occupational Health - Occupational Stress Questionnaire    Feeling of Stress : Not at all  Social Connections: Moderately Isolated (03/01/2023)   Social Connection and Isolation Panel [NHANES]    Frequency of Communication with Friends and Family: More than three times a week    Frequency of Social Gatherings with Friends and Family: Once a week    Attends Religious Services: More than 4 times per year    Active Member of [employer] or Organizations: No    Attends Banker Meetings: Never    Marital Status: Widowed    MEDICATIONS:  Current Outpatient Medications  Medication Sig Dispense Refill   apixaban (ELIQUIS) 5 MG TABS tablet TAKE 1 TABLET BY MOUTH TWICE  DAILY NEEDS CARDIOLOGY  APPOINTMENT FOR ELIQUIS REFILLS, CALL OFFICE 60 tablet 0   benazepril (LOTENSIN) 20 MG tablet Take 1 tablet (20 mg total) by mouth daily. 30 tablet 11   Blood Glucose Monitoring Suppl (ONETOUCH VERIO IQ SYSTEM) W/DEVICE KIT Use to check blood sugar 2 times per day dx code 250.02 1 kit 0   CALCIUM PO Take by mouth 2 (two) times daily.     diclofenac Sodium (VOLTAREN ARTHRITIS PAIN) 1 % GEL Apply 2 g topically 4 (four) times daily. 100 g 3   diltiazem (CARDIZEM CD) 360 MG 24 hr capsule TAKE 1 CAPSULE BY MOUTH DAILY 100 capsule 2   DULoxetine (CYMBALTA)  60 MG capsule Take 1 capsule (60 mg total) by mouth daily. 30 capsule 3   empagliflozin (JARDIANCE) 25 MG TABS tablet Take 1 tablet (25 mg total) by mouth daily before breakfast. 100 tablet 2   ergocalciferol (VITAMIN D2) 50000 UNITS capsule Take 50,000 Units by mouth once a week.     ferrous sulfate 325 (65 FE) MG EC tablet Take 1 tablet (325 mg total) by mouth 2 (two) times daily. (Patient taking differently: Take 325 mg by mouth daily with breakfast. Taking 325mg  once daily) 60 tablet 3   furosemide (LASIX) 40 MG tablet Take 1 tablet (40 mg total) by mouth daily. 30 tablet 2   gabapentin (NEURONTIN) 300 MG capsule Take 600 mg by mouth 3 (three) times daily.     HYDROcodone-acetaminophen (NORCO/VICODIN) 5-325 MG tablet Take 0.5-1 tablets by mouth 2 (two) times daily.     metFORMIN (GLUCOPHAGE-XR) 500 MG 24 hr tablet Take 1 tablet (500 mg total) by mouth 2 (two) times daily between meals. 180 tablet 3   methimazole (TAPAZOLE) 5 MG tablet Take 1 tablet (5 mg total) by mouth daily. 90 tablet 3   methocarbamol (ROBAXIN) 500 MG tablet Take 2 tablets (1,000 mg total) by mouth every 6 (six) hours as needed for muscle spasms. 240 tablet 2   metoprolol succinate (TOPROL-XL) 50 MG 24 hr tablet Take 1 tablet (50 mg total) by mouth daily. Take with or immediately following a meal. 30 tablet 2   ONETOUCH VERIO test strip USE AS DIRECTED TO CHECK  BLOOD SUGAR TWICE DAILY 200 strip 3   rosuvastatin (CRESTOR) 10 MG tablet TAKE 1 TABLET BY MOUTH DAILY 60 tablet 5   Semaglutide, 2 MG/DOSE, (OZEMPIC, 2 MG/DOSE,) 8 MG/3ML SOPN INJECT SUBCUTANEOUSLY 2 MG EVERY WEEK 9 mL 3   No current facility-administered medications for this visit.    PHYSICAL EXAM: Vitals:   06/22/23 0910  BP: 136/80  Pulse: 94  Resp: 20  SpO2: 98%  Weight: 191 lb 3.2 oz (86.7 kg)  Height:  5\' 3"  (1.6 m)    Body mass index is 33.87 kg/m.  Wt Readings from Last 3 Encounters:  06/22/23 191 lb 3.2 oz (86.7 kg)  04/27/23 198 lb 8 oz (90  kg)  03/22/23 203 lb 8 oz (92.3 kg)    General: Well developed, well nourished female in no apparent distress.  HEENT: AT/[place], no external lesions.  Eyes: Conjunctiva clear and no icterus. Neck: Neck supple, thyromegaly present right > left, 2 x normal. Right medical clavicle prominence.  Lungs: Respirations not labored Neurologic: Alert, oriented, normal speech Extremities / Skin: Dry. No sores or rashes noted.  No hand tremors  Psychiatric: Does not appear depressed or anxious  Diabetic Foot Exam - Simple   No data filed    LABS Reviewed Lab Results  Component Value Date   HGBA1C 7.0 (A) 06/22/2023   HGBA1C 6.8 (A) 01/16/2023   HGBA1C 7.4 (A) 09/28/2022   No results found for: "FRUCTOSAMINE" Lab Results  Component Value Date   CHOL 201 (H) 03/01/2023   HDL 108 03/01/2023   LDLCALC 83 03/01/2023   TRIG 56 03/01/2023   CHOLHDL 1.9 03/01/2023   Lab Results  Component Value Date   MICRALBCREAT 18.4 01/16/2023   MICRALBCREAT 8.3 02/03/2022   Lab Results  Component Value Date   CREATININE 1.18 (H) 03/01/2023   Lab Results  Component Value Date   GFR 48.83 (L) 01/16/2023    ASSESSMENT / PLAN  1. Controlled type 2 diabetes mellitus with complication, without long-term current use of insulin (HCC)   2. Subclinical hyperthyroidism   3. Multinodular goiter     Diabetes Mellitus type 2, complicated by CKD /diabetic retinopathy. - Diabetic status / severity: Controlled  Lab Results  Component Value Date   HGBA1C 7.0 (A) 06/22/2023    - Hemoglobin A1c goal : <7% - Medications: See below : No change.  Diabetes regimen: Ozempic 2 mg weekly. Jardiance 25 mg daily. Metformin extended release 500 mg 1 tablet 2 times a day.  - Home glucose testing: In the morning fasting and at bedtime. - Discussed/ Gave Hypoglycemia treatment plan.  # Consult : Referred to nephrology due to CKD, in the last visit.  # Annual urine for microalbuminuria/ creatinine ratio, no  microalbuminuria currently, continue ACE/AR/Benzapril. Last  Lab Results  Component Value Date   MICRALBCREAT 18.4 01/16/2023    # Foot check nightly.  # Annual dilated diabetic eye exams.  Following with ophthalmology.  She has retinopathy.  - Diet: Make healthy diabetic food choices - Life style / activity / exercise: Discussed.  2. Blood pressure  -  BP Readings from Last 1 Encounters:  06/22/23 136/80    - Control is in target.  - No change in current plans.  3. Lipid status / Hyperlipidemia - Last  Lab Results  Component Value Date   LDLCALC 83 03/01/2023   - Continue rosuvastatin 10 mg daily.  # Multiple thyroid nodules/subclinical hyperthyroidism : -Patient is known to have multiple thyroid nodules /multinodular goiter from 2006, at that time she had hyperthyroid symptoms, RAI uptake and scan showed relatively low uptake however had photopenic area on the right thyroid lobe.  In June 2015 she had ultrasound thyroid showed right dominant thyroid nodule measuring 5.1 cm and left thyroid nodule measuring 3.3 cm.  She had FNA of thyroid nodules in 2015 with benign cytology. -She is currently on methimazole 5 mg daily.  He started in the beginning of September 2024.  Plan: -Recent thyroid function  test with mildly low TSH.  Will not send the dose of methimazole at this time.  Will continue to monitor continue current dose of methimazole 5 mg daily. -Discussed about repeating ultrasound thyroid to monitor thyroid nodules, patient does not want to recheck ultrasound thyroid at this time, she wants to hold off for now. -Will recheck thyroid function test in next follow-up visit, if TSH remained low we will increase the dose of methimazole at that time.  Diagnoses and all orders for this visit:  Controlled type 2 diabetes mellitus with complication, without long-term current use of insulin (HCC) -     POCT glycosylated hemoglobin (Hb A1C) -     BASIC METABOLIC PANEL WITH  GFR -     Hemoglobin A1c -     Semaglutide, 2 MG/DOSE, (OZEMPIC, 2 MG/DOSE,) 8 MG/3ML SOPN; INJECT SUBCUTANEOUSLY 2 MG EVERY WEEK  Subclinical hyperthyroidism -     T4, free -     T3, free -     TSH -     methimazole (TAPAZOLE) 5 MG tablet; Take 1 tablet (5 mg total) by mouth daily.  Multinodular goiter    DISPOSITION Follow up in clinic in 3 months suggested.   All questions answered and patient verbalized understanding of the plan.  [place] Henrine Hayter, MD [facility] Endocrinology [facility] [address] [place], [address] [place], [place] [address] Phone # [phone]  At least part of this note was generated using voice recognition software. Inadvertent word errors may have occurred, which were not recognized during the proofreading process.

## 2023-06-28 ENCOUNTER — Other Ambulatory Visit: Payer: Self-pay | Admitting: Student

## 2023-06-28 DIAGNOSIS — I4891 Unspecified atrial fibrillation: Secondary | ICD-10-CM

## 2023-06-29 ENCOUNTER — Encounter: Payer: 59 | Admitting: Student

## 2023-07-16 ENCOUNTER — Encounter: Payer: Self-pay | Admitting: Endocrinology

## 2023-07-16 LAB — HM DIABETES EYE EXAM

## 2023-08-05 ENCOUNTER — Other Ambulatory Visit: Payer: Self-pay | Admitting: Nurse Practitioner

## 2023-08-05 DIAGNOSIS — D638 Anemia in other chronic diseases classified elsewhere: Secondary | ICD-10-CM

## 2023-08-05 NOTE — Progress Notes (Unsigned)
 Patient Care Team: Morrie Sheldon, MD as PCP - General (Internal Medicine) Nahser, Deloris Ping, MD as PCP - Cardiology (Cardiology)  Clinic Day:  08/10/2023  Referring physician: Eliezer Lofts, MD  ASSESSMENT & PLAN:   Assessment & Plan: Iron deficiency anemia -Initial work-up showed normal ferritin and serum iron level, slightly low iron saturation, normal B12 and folic acid.  Normal EPO, SPEP negative -Given her multiple comorbidities especially diabetes this was felt to be anemia of chronic disease -Given no other cytopenias, primary bone marrow disorder was felt to be less likely, a bone marrow biopsy has not been done -She responded well to Aranesp and Feraheme in the past, last given 09/2017.  Hg normalized -she has recurrent mild anemia hgb 10.9 - 11 in 11/2020 - 04/2021 in the setting of PNA and worsening CKD.  -anemia resolved as of 10/2021, currently on oral iron BID -Megan Fischer appears well. Today's CBC is normal, CMP with stable CKD, iron/TIBC are normal.  10 is within normal limits at 214. -She gets frequent labs at PCP and endo, I will see her once a year or sooner if she has recurrent anemia   Plan Reviewed labs - CBC essentially normal.  CMP showing stable chronic kidney disease. - Ferritin good at 215 with normal iron panel. --Currently does not need Aranesp injection or Feraheme infusion. --Will have her continue oral iron supplementation twice daily. Continue routine visits with primary care and endocrinology.  They closely monitor CBC and iron studies. Plan to continue with yearly follow-ups with lab checks.  She should return sooner if needed.  The patient understands the plans discussed today and is in agreement with them.  She knows to contact our office if she develops concerns prior to her next appointment.  I provided 20 minutes of face-to-face time during this encounter and > 50% was spent counseling as documented under my assessment and plan.    Megan Jews, NP  [facility] - A DEPT OF [facility] [address] [place] [address] Dept: [phone] Dept Fax: [phone]   No orders of the defined types were placed in this encounter.     CHIEF COMPLAINT:  CC: Follow-up of anemia of chronic disease and history of IDA  Current Treatment: Aranesp as needed, IV Feraheme as needed, oral iron twice daily  INTERVAL HISTORY:  Megan Fischer is here today for repeat clinical assessment.  She was last seen by Clayborn Heron, NP on 08/08/2022.  She reports feeling well.  Has no evidence of unusual bleeding such as blood in her stool, hematemesis, epistaxis, or unusual bruising.  She denies palpitations or dizziness.  She denies unusual headaches. She denies chest pain, chest pressure, or shortness of breath. She denies abdominal pain, nausea, vomiting, or changes in bowel or bladder habits.  She denies fevers or chills. She denies pain. Her appetite is good. Her weight has been stable.  I have reviewed the past medical history, past surgical history, social history and family history with the patient and they are unchanged from previous note.  ALLERGIES:  is allergic to aspirin, codeine, fentanyl, oxycodone hcl, penicillins, and chlorhexidine gluconate.  MEDICATIONS:  Current Outpatient Medications  Medication Sig Dispense Refill   apixaban (ELIQUIS) 5 MG TABS tablet TAKE 1 TABLET BY MOUTH TWICE  DAILY . NEEDS CARDIOLOGY  APPOINTMENT FOR ELIQUIS REFILLS, CALL OFFICE 30 tablet 0   benazepril (LOTENSIN) 20 MG tablet Take 1 tablet (  20 mg total) by mouth daily. 30 tablet 11   Blood Glucose Monitoring Suppl (ONETOUCH VERIO IQ SYSTEM) W/DEVICE KIT Use to check blood sugar 2 times per day dx code 250.02 1 kit 0   CALCIUM PO Take by mouth 2 (two) times daily.     diclofenac Sodium (VOLTAREN ARTHRITIS PAIN) 1 % GEL Apply 2 g topically 4 (four) times daily. 100 g 3   diltiazem (CARDIZEM CD) 360 MG 24 hr  capsule TAKE 1 CAPSULE BY MOUTH DAILY 100 capsule 2   DULoxetine (CYMBALTA) 60 MG capsule Take 1 capsule (60 mg total) by mouth daily. 30 capsule 3   empagliflozin (JARDIANCE) 25 MG TABS tablet Take 1 tablet (25 mg total) by mouth daily before breakfast. 100 tablet 2   ergocalciferol (VITAMIN D2) 50000 UNITS capsule Take 50,000 Units by mouth once a week.     ferrous sulfate 325 (65 FE) MG EC tablet Take 1 tablet (325 mg total) by mouth 2 (two) times daily. (Patient taking differently: Take 325 mg by mouth daily with breakfast. Taking 325mg  once daily) 60 tablet 3   furosemide (LASIX) 40 MG tablet Take 1 tablet (40 mg total) by mouth daily. 30 tablet 2   gabapentin (NEURONTIN) 300 MG capsule Take 600 mg by mouth 3 (three) times daily.     HYDROcodone-acetaminophen (NORCO/VICODIN) 5-325 MG tablet Take 0.5-1 tablets by mouth 2 (two) times daily.     metFORMIN (GLUCOPHAGE-XR) 500 MG 24 hr tablet Take 1 tablet (500 mg total) by mouth 2 (two) times daily between meals. 180 tablet 3   methimazole (TAPAZOLE) 5 MG tablet Take 1 tablet (5 mg total) by mouth daily. 90 tablet 3   methocarbamol (ROBAXIN) 500 MG tablet Take 2 tablets (1,000 mg total) by mouth every 6 (six) hours as needed for muscle spasms. 240 tablet 2   metoprolol succinate (TOPROL-XL) 50 MG 24 hr tablet TAKE 1 TABLET BY MOUTH DAILY  WITH OR IMMEDIATELY FOLLOWING A  MEAL 90 tablet 0   ONETOUCH VERIO test strip USE AS DIRECTED TO CHECK  BLOOD SUGAR TWICE DAILY 200 strip 3   rosuvastatin (CRESTOR) 10 MG tablet TAKE 1 TABLET BY MOUTH DAILY 60 tablet 5   Semaglutide, 2 MG/DOSE, (OZEMPIC, 2 MG/DOSE,) 8 MG/3ML SOPN INJECT SUBCUTANEOUSLY 2 MG EVERY WEEK 9 mL 3   No current facility-administered medications for this visit.     REVIEW OF SYSTEMS:   Constitutional: Denies fevers, chills or abnormal weight loss Eyes: Denies blurriness of vision Ears, nose, mouth, throat, and face: Denies mucositis or sore throat Respiratory: Denies cough, dyspnea  or wheezes Cardiovascular: Denies palpitation, chest discomfort or lower extremity swelling Gastrointestinal:  Denies nausea, heartburn or change in bowel habits Skin: Denies abnormal skin rashes Lymphatics: Denies new lymphadenopathy or easy bruising Neurological:Denies numbness, tingling or new weaknesses Behavioral/Psych: Mood is stable, no new changes  All other systems were reviewed with the patient and are negative.   VITALS:   Today's Vitals   08/08/23 0834  BP: 130/72  Pulse: 83  Resp: 20  Temp: (!) 97.2 F (36.2 C)  TempSrc: Temporal  SpO2: 95%  Weight: 197 lb 6.4 oz (89.5 kg)  Height: 5\' 3"  (1.6 m)  PainSc: 0-No pain   Body mass index is 34.97 kg/m.   Wt Readings from Last 3 Encounters:  08/08/23 197 lb 6.4 oz (89.5 kg)  06/22/23 191 lb 3.2 oz (86.7 kg)  04/27/23 198 lb 8 oz (90 kg)    Body mass index  is 34.97 kg/m.  Performance status (ECOG): 1 - Symptomatic but completely ambulatory  PHYSICAL EXAM:   GENERAL:alert, no distress and comfortable SKIN: skin color, texture, turgor are normal, no rashes or significant lesions EYES: normal, Conjunctiva are pink and non-injected, sclera clear OROPHARYNX:no exudate, no erythema and lips, buccal mucosa, and tongue normal  NECK: supple, thyroid normal size, non-tender, without nodularity LYMPH:  no palpable lymphadenopathy in the cervical, axillary or inguinal LUNGS: clear to auscultation and percussion with normal breathing effort HEART: regular rate & rhythm and no murmurs and no lower extremity edema ABDOMEN:abdomen soft, non-tender and normal bowel sounds Musculoskeletal:no cyanosis of digits and no clubbing  NEURO: alert & oriented x 3 with fluent speech, no focal motor/sensory deficits  LABORATORY DATA:  I have reviewed the data as listed    Component Value Date/Time   NA 142 08/08/2023 0813   NA 140 03/01/2023 1151   NA 143 02/16/2017 0802   K 3.8 08/08/2023 0813   K 3.5 02/16/2017 0802   CL 106  08/08/2023 0813   CO2 25 08/08/2023 0813   CO2 26 02/16/2017 0802   GLUCOSE 145 (H) 08/08/2023 0813   GLUCOSE 92 02/16/2017 0802   BUN 30 (H) 08/08/2023 0813   BUN 19 03/01/2023 1151   BUN 17.2 02/16/2017 0802   CREATININE 1.40 (H) 08/08/2023 0813   CREATININE 0.9 02/16/2017 0802   CALCIUM 10.0 08/08/2023 0813   CALCIUM 9.9 02/16/2017 0802   PROT 8.4 (H) 08/08/2023 0813   PROT 7.5 02/16/2017 0802   ALBUMIN 4.4 08/08/2023 0813   ALBUMIN 3.9 02/16/2017 0802   AST 25 08/08/2023 0813   AST 26 02/16/2017 0802   ALT 18 08/08/2023 0813   ALT 18 02/16/2017 0802   ALKPHOS 103 08/08/2023 0813   ALKPHOS 64 02/16/2017 0802   BILITOT 0.5 08/08/2023 0813   BILITOT 0.58 02/16/2017 0802   GFRNONAA 40 (L) 08/08/2023 0813   GFRAA >60 05/17/2018 0807    Lab Results  Component Value Date   WBC 6.3 08/08/2023   NEUTROABS 4.4 08/08/2023   HGB 12.8 08/08/2023   HCT 39.0 08/08/2023   MCV 89.7 08/08/2023   PLT 272 08/08/2023

## 2023-08-05 NOTE — Assessment & Plan Note (Signed)
-  Initial work-up showed normal ferritin and serum iron level, slightly low iron saturation, normal B12 and folic acid.  Normal EPO, SPEP negative -Given her multiple comorbidities especially diabetes this was felt to be anemia of chronic disease -Given no other cytopenias, primary bone marrow disorder was felt to be less likely, a bone marrow biopsy has not been done -She responded well to Aranesp and Feraheme in the past, last given 09/2017.  Hg normalized -she has recurrent mild anemia hgb 10.9 - 11 in 11/2020 - 04/2021 in the setting of PNA and worsening CKD.  -anemia resolved as of 10/2021, currently on oral iron BID -Megan Fischer appears well. Today's CBC is normal, CMP with stable CKD, iron/TIBC are normal. Will f/up the pending ferritin level -She gets frequent labs at PCP and endo, I will see her once a year or sooner if she has recurrent anemia

## 2023-08-06 ENCOUNTER — Other Ambulatory Visit: Payer: Self-pay | Admitting: Cardiovascular Disease

## 2023-08-06 DIAGNOSIS — I4891 Unspecified atrial fibrillation: Secondary | ICD-10-CM

## 2023-08-06 NOTE — Telephone Encounter (Signed)
 Prescription refill request for Eliquis received. Indication:afib Last office visit:needs appt Scr:1.18  10/24 Age: 73 Weight:86.7  kg  Prescription refilled

## 2023-08-08 ENCOUNTER — Inpatient Hospital Stay: Payer: 59 | Attending: Nurse Practitioner

## 2023-08-08 ENCOUNTER — Inpatient Hospital Stay (HOSPITAL_BASED_OUTPATIENT_CLINIC_OR_DEPARTMENT_OTHER): Payer: 59 | Admitting: Nurse Practitioner

## 2023-08-08 VITALS — BP 130/72 | HR 83 | Temp 97.2°F | Resp 20 | Ht 63.0 in | Wt 197.4 lb

## 2023-08-08 DIAGNOSIS — D509 Iron deficiency anemia, unspecified: Secondary | ICD-10-CM

## 2023-08-08 DIAGNOSIS — E1122 Type 2 diabetes mellitus with diabetic chronic kidney disease: Secondary | ICD-10-CM | POA: Diagnosis not present

## 2023-08-08 DIAGNOSIS — D631 Anemia in chronic kidney disease: Secondary | ICD-10-CM | POA: Diagnosis not present

## 2023-08-08 DIAGNOSIS — Z79899 Other long term (current) drug therapy: Secondary | ICD-10-CM | POA: Diagnosis not present

## 2023-08-08 DIAGNOSIS — D638 Anemia in other chronic diseases classified elsewhere: Secondary | ICD-10-CM

## 2023-08-08 DIAGNOSIS — Z7984 Long term (current) use of oral hypoglycemic drugs: Secondary | ICD-10-CM | POA: Diagnosis not present

## 2023-08-08 DIAGNOSIS — N189 Chronic kidney disease, unspecified: Secondary | ICD-10-CM | POA: Insufficient documentation

## 2023-08-08 DIAGNOSIS — Z7985 Long-term (current) use of injectable non-insulin antidiabetic drugs: Secondary | ICD-10-CM | POA: Insufficient documentation

## 2023-08-08 LAB — CBC WITH DIFFERENTIAL (CANCER CENTER ONLY)
Abs Immature Granulocytes: 0.04 10*3/uL (ref 0.00–0.07)
Basophils Absolute: 0 10*3/uL (ref 0.0–0.1)
Basophils Relative: 1 %
Eosinophils Absolute: 0.1 10*3/uL (ref 0.0–0.5)
Eosinophils Relative: 2 %
HCT: 39 % (ref 36.0–46.0)
Hemoglobin: 12.8 g/dL (ref 12.0–15.0)
Immature Granulocytes: 1 %
Lymphocytes Relative: 22 %
Lymphs Abs: 1.4 10*3/uL (ref 0.7–4.0)
MCH: 29.4 pg (ref 26.0–34.0)
MCHC: 32.8 g/dL (ref 30.0–36.0)
MCV: 89.7 fL (ref 80.0–100.0)
Monocytes Absolute: 0.4 10*3/uL (ref 0.1–1.0)
Monocytes Relative: 6 %
Neutro Abs: 4.4 10*3/uL (ref 1.7–7.7)
Neutrophils Relative %: 68 %
Platelet Count: 272 10*3/uL (ref 150–400)
RBC: 4.35 MIL/uL (ref 3.87–5.11)
RDW: 15.7 % — ABNORMAL HIGH (ref 11.5–15.5)
WBC Count: 6.3 10*3/uL (ref 4.0–10.5)
nRBC: 0 % (ref 0.0–0.2)

## 2023-08-08 LAB — CMP (CANCER CENTER ONLY)
ALT: 18 U/L (ref 0–44)
AST: 25 U/L (ref 15–41)
Albumin: 4.4 g/dL (ref 3.5–5.0)
Alkaline Phosphatase: 103 U/L (ref 38–126)
Anion gap: 11 (ref 5–15)
BUN: 30 mg/dL — ABNORMAL HIGH (ref 8–23)
CO2: 25 mmol/L (ref 22–32)
Calcium: 10 mg/dL (ref 8.9–10.3)
Chloride: 106 mmol/L (ref 98–111)
Creatinine: 1.4 mg/dL — ABNORMAL HIGH (ref 0.44–1.00)
GFR, Estimated: 40 mL/min — ABNORMAL LOW (ref >60)
Glucose, Bld: 145 mg/dL — ABNORMAL HIGH (ref 70–99)
Potassium: 3.8 mmol/L (ref 3.5–5.1)
Sodium: 142 mmol/L (ref 135–145)
Total Bilirubin: 0.5 mg/dL (ref 0.0–1.2)
Total Protein: 8.4 g/dL — ABNORMAL HIGH (ref 6.5–8.1)

## 2023-08-08 LAB — IRON AND IRON BINDING CAPACITY (CC-WL,HP ONLY)
Iron: 66 ug/dL (ref 28–170)
Saturation Ratios: 20 % (ref 10.4–31.8)
TIBC: 329 ug/dL (ref 250–450)
UIBC: 263 ug/dL (ref 148–442)

## 2023-08-08 LAB — FERRITIN: Ferritin: 215 ng/mL (ref 11–307)

## 2023-08-10 ENCOUNTER — Encounter: Payer: Self-pay | Admitting: Hematology

## 2023-08-10 ENCOUNTER — Encounter: Payer: Self-pay | Admitting: Nurse Practitioner

## 2023-09-12 ENCOUNTER — Encounter (HOSPITAL_COMMUNITY): Payer: Self-pay

## 2023-09-14 ENCOUNTER — Other Ambulatory Visit: Payer: 59

## 2023-09-17 ENCOUNTER — Other Ambulatory Visit: Payer: Self-pay | Admitting: Student

## 2023-09-17 DIAGNOSIS — I4891 Unspecified atrial fibrillation: Secondary | ICD-10-CM

## 2023-09-19 ENCOUNTER — Ambulatory Visit: Payer: Self-pay | Admitting: Student

## 2023-09-19 ENCOUNTER — Ambulatory Visit: Payer: 59 | Admitting: Endocrinology

## 2023-09-19 ENCOUNTER — Ambulatory Visit (HOSPITAL_COMMUNITY)
Admission: RE | Admit: 2023-09-19 | Discharge: 2023-09-19 | Disposition: A | Discharge status: Home / Self Care | Source: Ambulatory Visit | Attending: Family Medicine | Admitting: Family Medicine

## 2023-09-19 VITALS — BP 158/86 | HR 60 | Ht 63.0 in | Wt 197.9 lb

## 2023-09-19 DIAGNOSIS — R079 Chest pain, unspecified: Secondary | ICD-10-CM | POA: Insufficient documentation

## 2023-09-19 DIAGNOSIS — L03012 Cellulitis of left finger: Secondary | ICD-10-CM | POA: Insufficient documentation

## 2023-09-19 DIAGNOSIS — I4891 Unspecified atrial fibrillation: Secondary | ICD-10-CM | POA: Insufficient documentation

## 2023-09-19 MED ORDER — SULFAMETHOXAZOLE-TRIMETHOPRIM 800-160 MG PO TABS: 1.0000 | ORAL_TABLET | Freq: Two times a day (BID) | ORAL | 0 refills | Status: AC

## 2023-09-19 MED ORDER — CEFTRIAXONE SODIUM 1 G IJ SOLR
1.0000 g | Freq: Once | INTRAMUSCULAR | Status: AC
  Administered 2023-09-19: 1 g via INTRAMUSCULAR

## 2023-09-19 NOTE — Progress Notes (Signed)
 Established Patient Office Visit  Subjective   Patient ID: Megan Fischer, female    DOB: 03/23/1951  Age: 73 y.o. MRN: [id number]  Chief Complaint  Patient presents with   Follow-up   Medication Refill    Patient is a 73 y.o. with a past medical history stated below who presents today for a routine visit. Of note, patient mentioned new chest pain that happened a few days ago and persisted for a few days (now resolved) and a new left third digit wound. Patient was late to appointment and the focus of visit became on these acute issues. Please see problem based assessment and plan for additional details.      Past Medical History:  Diagnosis Date   Atrial fibrillation (HCC)    Chronic disease anemia 03/29/2011   Diabetes mellitus    Dyslipidemia    Hypertension    Obesities, morbid (HCC)    Osteoarthritis     Review of Systems  Constitutional:  Negative for fever.  Cardiovascular:  Negative for chest pain, palpitations and leg swelling.  Skin:  Negative for rash.  Neurological:  Negative for dizziness and weakness.     Objective:     BP (!) 158/86 (BP Location: Left Arm, Patient Position: Sitting, Cuff Size: Normal)   Pulse 60   Ht 5\' 3"  (1.6 m)   Wt 197 lb 14.4 oz (89.8 kg)   SpO2 100%   BMI 35.06 kg/m  BP Readings from Last 3 Encounters:  09/19/23 (!) 158/86  08/08/23 130/72  06/22/23 136/80   Wt Readings from Last 3 Encounters:  09/19/23 197 lb 14.4 oz (89.8 kg)  08/08/23 197 lb 6.4 oz (89.5 kg)  06/22/23 191 lb 3.2 oz (86.7 kg)     Physical Exam HENT:     Head: Normocephalic and atraumatic.  Cardiovascular:     Rate and Rhythm: Rhythm irregular.     Pulses: Normal pulses.     Heart sounds: Normal heart sounds.  Pulmonary:     Effort: Pulmonary effort is normal.     Breath sounds: Normal breath sounds.  Abdominal:     General: Bowel sounds are normal.     Palpations: Abdomen is soft.  Musculoskeletal:     Comments: Ulceration to the third left  digit with surrounding erythema, tenderness to palpation that goes from the tip of the finger to the base of the finer/palm of the hand, patient's ROM limited by pain, unable to flex and extend finger normally  Skin:    General: Skin is warm.  Neurological:     General: No focal deficit present.     Mental Status: She is alert.  Psychiatric:        Mood and Affect: Mood normal.        No results found for any visits on 09/19/23.  The ASCVD Risk score (Arnett DK, et al., 2019) failed to calculate for the following reasons:   The valid HDL cholesterol range is 20 to 100 mg/dL    Assessment & Plan:   Problem List Items Addressed This Visit     Atrial fibrillation [facility])   Patient with history of paroxysmal afib, followed with Dr. Alroy Aspen of [facility] in the past. Current regimen includes metoprolol  50 mg daily, diltiazem  360 mg daily, and eliquis  5 mg BID. CV exam today remarkable for irregular rhythm. Other than the episode of persistent chest pain a few days ago patient is denying heart palpitations, confusion, dizziness, or headache. EKG  today with afib HR 78. Discussed need for patient to follow up with cardiology outpatient for further management, especially since she has been taking her medications consistently and has not had a follow up visit since Dr. Alroy Aspen retired. Patient agreeable to plan.  Plan - Continue metoprolol  50 mg daily, diltiazem  360 mg daily, and eliquis  5 mg BID - Patient provided with contact information for Dr. Letta Raw office so that she can get an appointment with a different provider for continued follow up      Cellulitis of left finger   Patient with wound on left third digit that began 2 weeks ago. States it started out as a blister that popped on its own. Has been applying neosporin to the site. Pain is rated as a 6 out of 10. Has not taken any OTC Tylenol  for pain control. Not able to take NSAIDs. Denies fever, nausea, vomiting, or other  systemic symptoms. Physical exam concerning for cellulitis or tendosynovitis given that tenderness to palpation extends down to the base of the finger. Patient is not able to flex or extend the finger normally due to pain. Please see physical exam portion for images. Discussed risk of deeper infection given risk factors that include T2DM requiring evaluation by hand surgery. Also discussed importance of starting antibiotic treatment. Patient was given the option to be seen by hand surgeon same day (5/14) but she declined due to transportation issues. Despite emphasizing recommendation to be seen the same day patient preferred to make arrangements for a visit tomorrow. She was scheduled to be seen by [facility] Labella orthopedic specialists tomorrow 5/15 at 3 PM. Stressed the importance of keeping that appointment. Reviewed history of penicillin allergy which patient states was limited to skin "breaking out", denied respiratory symptoms. Patient given 1x dose of IM Rocephin  1 g in the office without issues. Also prescribed 10 day course of bactrim for MRSA coverage. Reviewed return precautions with patient and recommended she schedule a telehealth appointment for this Friday to ensure she has been assessed by surgery. Patient agreeable to plan.  Plan - START Bactrim BID for 10 days  - 5/15 appointment with orthopedics for further evaluation and treatment recommendations - Telehealth visit 5/16      Chest pain - Primary   Patient reported recent chest pain that lasted about 3 days and was accompanied by left arm numbness. Resolved without treatment. States she was at home when it started, thought it would get better on its own so she did not seek additional medical assistance. Denied nausea/vomiting, weakness, confusion, headache, or blurry vision. Feels fine today, chest pain has not returned. CV exam remarkable for irregular beat. Ordered EKG due to concerns for recent MI in the setting of known pAfib. EKG  showed not concerning for current or recent ACS, did show Afib with HR 78. Discussed importance of regular follow up with cardiology with patient, who has not been seen since 2023 when her cardiologist retired. Given patient is asymptomatic and seems to have stable vitals it is reasonable for her to make outpatient appointment with cardiology for follow up. Discussed return precautions should she experience similar pain again. Patient agreeable to plan.  Plan - Patient provided with contact information for [facility]/Dr. Nahser's office - Follow up with patient regarding appointment and symptoms during telehealth visit on 5/16      Relevant Orders   EKG 12-Lead (Completed)   Other Visit Diagnoses       Cellulitis of finger of left hand  Relevant Medications   cefTRIAXone  (ROCEPHIN ) injection 1 g (Completed)   Other Relevant Orders   Ambulatory referral to Hand Surgery      Patient seen with Dr. Ancil Balzarine.  Return Telehealth visit Friday, for left finger pain, chest pain follow up .   Kiersten Coss Arellano Zameza, MD PGY-1, [facility]

## 2023-09-19 NOTE — Assessment & Plan Note (Signed)
 Patient with history of paroxysmal afib, followed with Dr. Alroy Aspen of [facility] in the past. Current regimen includes metoprolol  50 mg daily, diltiazem  360 mg daily, and eliquis  5 mg BID. CV exam today remarkable for irregular rhythm. Other than the episode of persistent chest pain a few days ago patient is denying heart palpitations, confusion, dizziness, or headache. EKG today with afib HR 78. Discussed need for patient to follow up with cardiology outpatient for further management, especially since she has been taking her medications consistently and has not had a follow up visit since Dr. Alroy Aspen retired. Patient agreeable to plan.  Plan - Continue metoprolol  50 mg daily, diltiazem  360 mg daily, and eliquis  5 mg BID - Patient provided with contact information for Dr. Letta Raw office so that she can get an appointment with a different provider for continued follow up

## 2023-09-19 NOTE — Patient Instructions (Addendum)
 Thank you, [name] for allowing us  to provide your care today. Today we discussed your finger pain and recent chest pain.   I have ordered the following labs for you:  Lab Orders  No laboratory test(s) ordered today     Tests ordered today:  None  Referrals ordered today:   Referral Orders         Ambulatory referral to Hand Surgery      I have ordered the following medication/changed the following medications:   Stop the following medications: There are no discontinued medications.   Start the following medications: Meds ordered this encounter  Medications   cefTRIAXone  (ROCEPHIN ) injection 1 g   sulfamethoxazole-trimethoprim (BACTRIM DS) 800-160 MG tablet    Sig: Take 1 tablet by mouth 2 (two) times daily for 10 days.    Dispense:  20 tablet    Refill:  0     Follow up: Telehealth visit Friday for follow up on left finger pain   Remember:   For your left finger pain:  - We are concerned that there is a deeper infection that requires further imaging and treatment. For now, you have received an antibiotic by injection, and you will start taking another antibiotic by mouth called Bactrim twice a day for 10 days. We recommended that you be seen by hand surgery today but you declined due to transportation issues. We strongly recommend being seen sooner but understand your situation. Should your pain worsen or you develop a fever please go to the emergency department right away.  [facility]  Appt tomorrow Thursday 09/20/23 @3pm  with Megan Fischer Plan Sports medicine clinic in [place], [place]  Located in: [facility] Address: [address], [place], [place] [address] Phone: [phone]   For your chest pain:  - Your EKG shows atrial fibrillation. You were last seen by Dr. Alroy Fischer at [facility]. Please call their office and set up an appointment to further discuss your recent chest pain.  Address: [address], [place], [place] [address] Phone: [phone]  PLEASE follow up with us  over a telephone visit Friday to make sure you have been evaluated and are receiving appropriate follow up. If you develop a fever, nausea/vomiting, confusion, or new chest pain please go to the emergency department for further assistance as you may be experiencing a medical emergency.   Should you have any questions or concerns please call the internal medicine clinic at [phone].    Megan Coby Arellano Zameza, MD PGY-1 Internal Medicine Teaching Prog[facility]

## 2023-09-19 NOTE — Assessment & Plan Note (Signed)
 Patient reported recent chest pain that lasted about 3 days and was accompanied by left arm numbness. Resolved without treatment. States she was at home when it started, thought it would get better on its own so she did not seek additional medical assistance. Denied nausea/vomiting, weakness, confusion, headache, or blurry vision. Feels fine today, chest pain has not returned. CV exam remarkable for irregular beat. Ordered EKG due to concerns for recent MI in the setting of known pAfib. EKG showed not concerning for current or recent ACS, did show Afib with HR 78. Discussed importance of regular follow up with cardiology with patient, who has not been seen since 2023 when her cardiologist retired. Given patient is asymptomatic and seems to have stable vitals it is reasonable for her to make outpatient appointment with cardiology for follow up. Discussed return precautions should she experience similar pain again. Patient agreeable to plan.  Plan - Patient provided with contact information for [facility]/Dr. Nahser's office - Follow up with patient regarding appointment and symptoms during telehealth visit on 5/16

## 2023-09-19 NOTE — Assessment & Plan Note (Signed)
 Patient with wound on left third digit that began 2 weeks ago. States it started out as a blister that popped on its own. Has been applying neosporin to the site. Pain is rated as a 6 out of 10. Has not taken any OTC Tylenol  for pain control. Not able to take NSAIDs. Denies fever, nausea, vomiting, or other systemic symptoms. Physical exam concerning for cellulitis or tendosynovitis given that tenderness to palpation extends down to the base of the finger. Patient is not able to flex or extend the finger normally due to pain. Please see physical exam portion for images. Discussed risk of deeper infection given risk factors that include T2DM requiring evaluation by hand surgery. Also discussed importance of starting antibiotic treatment. Patient was given the option to be seen by hand surgeon same day (5/14) but she declined due to transportation issues. Despite emphasizing recommendation to be seen the same day patient preferred to make arrangements for a visit tomorrow. She was scheduled to be seen by [facility] Labella orthopedic specialists tomorrow 5/15 at 3 PM. Stressed the importance of keeping that appointment. Reviewed history of penicillin allergy which patient states was limited to skin "breaking out", denied respiratory symptoms. Patient given 1x dose of IM Rocephin  1 g in the office without issues. Also prescribed 10 day course of bactrim for MRSA coverage. Reviewed return precautions with patient and recommended she schedule a telehealth appointment for this Friday to ensure she has been assessed by surgery. Patient agreeable to plan.  Plan - START Bactrim BID for 10 days  - 5/15 appointment with orthopedics for further evaluation and treatment recommendations - Telehealth visit 5/16

## 2023-09-20 ENCOUNTER — Other Ambulatory Visit: Payer: Self-pay | Admitting: Student

## 2023-09-20 ENCOUNTER — Telehealth: Payer: Self-pay | Admitting: Student

## 2023-09-20 DIAGNOSIS — I10 Essential (primary) hypertension: Secondary | ICD-10-CM

## 2023-09-20 DIAGNOSIS — L03012 Cellulitis of left finger: Secondary | ICD-10-CM

## 2023-09-20 NOTE — Progress Notes (Signed)
 Spoke with patient on the phone this morning (5/15) to discuss antibiotic therapy for her left finger wound and appointment with hand surgery this afternoon. Patient confirmed she picked up the Bactrim and has started taking it. Discussed option to change out antibiotic with another (Linezolid) given her history of CKD or continue Bactrim and follow up with telehealth appointment tomorrow, 5/16 (clinic closed, moving to another location) after she has spoken to the surgeon who may also make changes to her current antibiotic therapy. Patient was agreeable to continue Bactrim (CMP on 4/02 with Cr 1.40, eGFR 40) for now. Discussed possible side effects including gastrointestinal issues, dizziness, headache, or lightheadedness. Reviewed return precautions should she experience any symptoms while taking it. Patient expressed understanding. Emphasized importance of going to her appointment with hand surgery today as well. Will need follow up BMP next week for lab only visit. Future order for BMP placed by Dr. [name].    Kamarius Buckbee Arellano Zameza, MD PGY-1, [facility]

## 2023-09-20 NOTE — Telephone Encounter (Signed)
 Called pt to notify her of the sch lab appt per Dr. Lydia Sams and Dr. Roddy Citizen for the following day:  Name: Megan Fischer, [place] MRN: [id number]  Date: 09/26/2023 Status: Sch  Time: 11:00 AM Length: 30  Visit Type: LAB 30 [446] Copay: $0.00  Provider: [facility] Department: [facility]             Copied from CRM #[id number]. Topic: Appointments - Scheduling Inquiry for Clinic >> Sep 20, 2023 12:35 PM Danelle Dunning F wrote: Reason for CRM:   Dr. Roddy Citizen called into the office stating she would like the patient scheduled for a telehealth appointment with Dr. Lydia Sams for tomorrow 09/21/2023 preferably in the morning.   Agent attempted to contact the patient three times with no answer and was unable to leave a voicemail.   Appt Notes: Check in on left finger wound  Best Contact Number: [phone]

## 2023-09-20 NOTE — Addendum Note (Signed)
 Addended by: Laurence Crofford C on: 09/20/2023 04:42 PM   Modules accepted: Orders

## 2023-09-20 NOTE — Telephone Encounter (Signed)
 Future orders placed for BMP. Mychart message sent for when to come in.

## 2023-09-21 ENCOUNTER — Ambulatory Visit: Admitting: Physician Assistant

## 2023-09-21 ENCOUNTER — Encounter: Payer: Self-pay | Admitting: Student

## 2023-09-21 ENCOUNTER — Ambulatory Visit: Admitting: Student

## 2023-09-21 ENCOUNTER — Encounter: Payer: Self-pay | Admitting: Hematology

## 2023-09-21 DIAGNOSIS — L03012 Cellulitis of left finger: Secondary | ICD-10-CM

## 2023-09-21 DIAGNOSIS — R079 Chest pain, unspecified: Secondary | ICD-10-CM

## 2023-09-21 DIAGNOSIS — M79642 Pain in left hand: Secondary | ICD-10-CM

## 2023-09-21 MED ORDER — MUPIROCIN 2 % EX OINT: 1.0000 | TOPICAL_OINTMENT | Freq: Two times a day (BID) | CUTANEOUS | 0 refills | Status: DC

## 2023-09-21 NOTE — Progress Notes (Signed)
   CC: Hand cellulitis follow up   This is a telephone encounter between Megan Fischer and Megan Fischer on 09/21/2023 for Hand cellulitis follow up. The visit was conducted with the patient located at home and Megan Fischer at [facility]. The patient's identity was confirmed using their DOB and current address. The patient has consented to being evaluated through a telephone encounter and understands the associated risks (an examination cannot be done and the patient may need to come in for an appointment) / benefits (allows the patient to remain at home, decreasing exposure to coronavirus). I personally spent 10 minutes on medical discussion.   HPI:  [name] is a 73 y.o. with PMH as below.   Please see A&P for assessment of the patient's acute and chronic medical conditions.   Past Medical History:  Diagnosis Date   Atrial fibrillation (HCC)    Chronic disease anemia 03/29/2011   Diabetes mellitus    Dyslipidemia    Hypertension    Obesities, morbid (HCC)    Osteoarthritis    Review of Systems:      Assessment & Plan:   Cellulitis of left finger Patient is following up for cellulitis of left finger.  Patient was recently seen in the office on 09/19/2023 for concerns of pain and swelling noted to her left finger.  Patient was given 1 dose of Rocephin  in the office and discharged with 10 days of Bactrim.  Patient has appoint with hand doctor this morning.  I spoke with the patient after her appointment with her hand doctor. She states that she was told that the wound was doing okay. She was told to continue the bactrim and can add mupirocin  with a wrap. She is to follow up Dr. Merlinda Fischer for further evaluation. Patient reports that she has been adherent to her Bactrim and will come for BMP labs next week.   Plan: -Continue Bactrim -Continue Mupirocin  cream -Follow up with Dr. Merlinda Fischer in 1 week  Chest pain Patient presents to follow up on her chest pain. She was instructed to call the cardiology  clinic to get plugged back in. She states she plans to do that today as she had an appointment today and will address that when she gets home. She endorses that her chest pain has resolved for the time being and has no concerns about this at this time.     Patient discussed with Dr. Bevelyn Fischer  Megan Neri, DO  Internal Medicine Resident

## 2023-09-21 NOTE — Assessment & Plan Note (Addendum)
 Patient is following up for cellulitis of left finger.  Patient was recently seen in the office on 09/19/2023 for concerns of pain and swelling noted to her left finger.  Patient was given 1 dose of Rocephin  in the office and discharged with 10 days of Bactrim.  Patient has appoint with hand doctor this morning.  I spoke with the patient after her appointment with her hand doctor. She states that she was told that the wound was doing okay. She was told to continue the bactrim and can add mupirocin  with a wrap. She is to follow up Dr. Merlinda Starling for further evaluation. Patient reports that she has been adherent to her Bactrim and will come for BMP labs next week.   Plan: -Continue Bactrim -Continue Mupirocin  cream -Follow up with Dr. Merlinda Starling in 1 week

## 2023-09-21 NOTE — Progress Notes (Signed)
 Office Visit Note   Patient: Megan Fischer           Date of Birth: [birth year]           MRN: [id number] Visit Date: 09/21/2023              Requested by: Maxie Spaniel, MD [address] [place],  [place] [address] PCP: Maxie Spaniel, MD   Assessment & Plan: Visit Diagnoses:  1. Pain in left hand     Plan: Impression is left long finger wound to the volar aspect of the distal phalanx.  Patient does not recall any sort of injury that proceeded this blister/wound.  She does have a history of diabetes but no history of vascular disease.  The wound today, does not look like there is evidence of infection.  We would like to keep her on Bactrim to prevent infection, however.  We have also discussed applying mupirocin  covered with 4 x 4's and Coban which she will do on her own twice a day.  Will have her follow-up with our hand surgeon next week for further evaluation and treatment recommendation.  Call with concerns or questions.  Follow-Up Instructions: Return for f/u with Dr. Merlinda Starling next week.   Orders:  Orders Placed This Encounter  Procedures   Ambulatory referral to Orthopedic Surgery   Meds ordered this encounter  Medications   mupirocin  ointment (BACTROBAN ) 2 %    Sig: Apply 1 Application topically 2 (two) times daily. Apply to finger wound twice daily and cover with dry bandage    Dispense:  22 g    Refill:  0      Procedures: No procedures performed   Clinical Data: No additional findings.   Subjective: Chief Complaint  Patient presents with   Left Middle Finger - Pain    HPI patient is a very pleasant 73 year old female who comes in today with concerns about her left long finger.  Approximately 2 weeks ago, she woke up and noticed a blister to the volar aspect which opened on its own.  She denies having any injury, burn, cuts, scrapes, bug bite, etc. to the area she has had only mild discomfort which primarily occurs only at night and with certain movements of the  finger.  She is not taking anything for the pain.  She denies any fevers or chills or any other constitutional symptoms.  She is a type II diabetic.  She has been on Bactrim DS since yesterday morning.  Review of Systems as detailed in HPI.  All others reviewed and are negative.   Objective: Vital Signs: There were no vitals taken for this visit.  Physical Exam well-developed and well-nourished female in no acute distress.  Alert and oriented x 3.  Ortho Exam examination of the left long finger: Mild swelling to the entire finger.  No warmth.  No erythema.  Her skin does appear to be slightly darkened to the dorsum of the distal phalanx.  She does not have any increased pain with flexion of the MCP and PIP joints.  Mild pain with flexion of the DIP joint.  There is very mild tenderness to the dorsum of the finger between the DIP and PIP joints.  Allen test: 6 seconds with ulnar artery and 3 seconds with radial artery.  Please refer to images of the wound below  Specialty Comments:  No specialty comments available.  Imaging: No new imaging   PMFS History: Patient Active Problem List   Diagnosis  Date Noted   Cellulitis of left finger 09/19/2023   Chest pain 09/19/2023   Leg pain 03/02/2023   Acute respiratory failure with hypoxia (HCC) 04/11/2021   Acute on chronic diastolic CHF (congestive heart failure) (HCC) 04/11/2021   Hypercholesterolemia 09/17/2018   Iron deficiency anemia 06/24/2017   Anemia of chronic disease 07/20/2016   Healthcare maintenance 06/09/2013   Multinodular goiter 01/20/2013   Chronic disease anemia 03/29/2011   Obesities, morbid (HCC)    Hypertension    Dyslipidemia    DM (diabetes mellitus) type II controlled with renal manifestation (HCC)    Osteoarthritis    Atrial fibrillation (HCC) 08/04/2010   Past Medical History:  Diagnosis Date   Atrial fibrillation (HCC)    Chronic disease anemia 03/29/2011   Diabetes mellitus    Dyslipidemia     Hypertension    Obesities, morbid (HCC)    Osteoarthritis     Family History  Problem Relation Age of Onset   Heart attack Mother    Stroke Mother    Diabetes Mother    Hypertension Brother    Breast cancer Neg Hx     Past Surgical History:  Procedure Laterality Date   CHOLECYSTECTOMY     REPLACEMENT TOTAL KNEE BILATERAL     SHOULDER SURGERY     RIGHT SHOULDER   TUBAL LIGATION     Social History   Occupational History   Not on file  Tobacco Use   Smoking status: Never   Smokeless tobacco: Never  Vaping Use   Vaping status: Never Used  Substance and Sexual Activity   Alcohol use: No   Drug use: No   Sexual activity: Not Currently

## 2023-09-21 NOTE — Assessment & Plan Note (Signed)
 Patient presents to follow up on her chest pain. She was instructed to call the cardiology clinic to get plugged back in. She states she plans to do that today as she had an appointment today and will address that when she gets home. She endorses that her chest pain has resolved for the time being and has no concerns about this at this time.

## 2023-09-21 NOTE — Progress Notes (Signed)
 Internal Medicine Clinic Attending  I was physically present during the key portions of the resident provided service and participated in the medical decision making of patient's management care. I reviewed pertinent patient test results.  The assessment, diagnosis, and plan were formulated together and I agree with the documentation in the resident's note.  Erosive wound of the distal left third digit with pain and swelling extending down the finger to the third MCP. No further hand pain. Concern for potential deep infection, urgent referral to hand surgery placed and antibiotics started. F/u appointment made and return precautions discussed.  Bevelyn Bryant, MD

## 2023-09-26 ENCOUNTER — Ambulatory Visit: Admitting: Orthopedic Surgery

## 2023-09-26 ENCOUNTER — Other Ambulatory Visit (INDEPENDENT_AMBULATORY_CARE_PROVIDER_SITE_OTHER)

## 2023-09-26 DIAGNOSIS — I10 Essential (primary) hypertension: Secondary | ICD-10-CM

## 2023-09-27 ENCOUNTER — Ambulatory Visit: Payer: Self-pay | Admitting: Student

## 2023-09-27 DIAGNOSIS — I10 Essential (primary) hypertension: Secondary | ICD-10-CM

## 2023-09-27 LAB — BMP8+ANION GAP
Anion Gap: 19 mmol/L — ABNORMAL HIGH (ref 10.0–18.0)
BUN/Creatinine Ratio: 13 (ref 12–28)
BUN: 22 mg/dL (ref 8–27)
CO2: 17 mmol/L — ABNORMAL LOW (ref 20–29)
Calcium: 10 mg/dL (ref 8.7–10.3)
Chloride: 107 mmol/L — ABNORMAL HIGH (ref 96–106)
Creatinine, Ser: 1.67 mg/dL — ABNORMAL HIGH (ref 0.57–1.00)
Glucose: 146 mg/dL — ABNORMAL HIGH (ref 70–99)
Potassium: 5 mmol/L (ref 3.5–5.2)
Sodium: 143 mmol/L (ref 134–144)
eGFR: 32 mL/min/{1.73_m2} — ABNORMAL LOW (ref >59)

## 2023-09-28 NOTE — Progress Notes (Signed)
 Internal Medicine Clinic Attending  Case discussed with the resident at the time of the visit.  We reviewed the resident's history and exam and pertinent patient test results.  I agree with the assessment, diagnosis, and plan of care documented in the resident's note.

## 2023-10-04 ENCOUNTER — Other Ambulatory Visit (INDEPENDENT_AMBULATORY_CARE_PROVIDER_SITE_OTHER)

## 2023-10-04 DIAGNOSIS — I10 Essential (primary) hypertension: Secondary | ICD-10-CM

## 2023-10-05 LAB — BMP8+ANION GAP
Anion Gap: 21 mmol/L — ABNORMAL HIGH (ref 10.0–18.0)
BUN/Creatinine Ratio: 18 (ref 12–28)
BUN: 27 mg/dL (ref 8–27)
CO2: 15 mmol/L — ABNORMAL LOW (ref 20–29)
Calcium: 10.4 mg/dL — ABNORMAL HIGH (ref 8.7–10.3)
Chloride: 108 mmol/L — ABNORMAL HIGH (ref 96–106)
Creatinine, Ser: 1.48 mg/dL — ABNORMAL HIGH (ref 0.57–1.00)
Glucose: 147 mg/dL — ABNORMAL HIGH (ref 70–99)
Potassium: 4.9 mmol/L (ref 3.5–5.2)
Sodium: 144 mmol/L (ref 134–144)
eGFR: 37 mL/min/{1.73_m2} — ABNORMAL LOW (ref >59)

## 2023-10-08 ENCOUNTER — Ambulatory Visit: Payer: Self-pay | Admitting: Student

## 2023-10-08 NOTE — Progress Notes (Signed)
 I spoke to the patient via phone call regarding her lab results and stated that kidney function is slowly improving. I want to get one more BMP next week to make sure kidney function continues to improve. However, she reports that he finger might be getting worse, and that she is noticing some drainage. I am going to get in touch with my clinic to make sure she gets a provider visit here in our clinic. She states that she missed he hand surgeon appointment and is now going on June 13th. However, I think it is too far away and it would be important for someone to see her before her appointment to make sure her finger is not getting worse.

## 2023-10-10 ENCOUNTER — Encounter: Payer: Self-pay | Admitting: Student

## 2023-10-10 ENCOUNTER — Ambulatory Visit (INDEPENDENT_AMBULATORY_CARE_PROVIDER_SITE_OTHER): Admitting: Student

## 2023-10-10 VITALS — BP 121/76 | HR 85 | Ht 63.0 in | Wt 187.3 lb

## 2023-10-10 DIAGNOSIS — N179 Acute kidney failure, unspecified: Secondary | ICD-10-CM

## 2023-10-10 DIAGNOSIS — L03012 Cellulitis of left finger: Secondary | ICD-10-CM | POA: Diagnosis not present

## 2023-10-10 NOTE — Progress Notes (Signed)
 CC: BMP recheck, cellulitis of finger f/u  HPI:  Megan Fischer is a 73 y.o. female living with a history stated below and presents today for the chief complaint stated above. Please see problem based assessment and plan for additional details.  Past Medical History:  Diagnosis Date   Atrial fibrillation (HCC)    Chronic disease anemia 03/29/2011   Diabetes mellitus    Dyslipidemia    Hypertension    Obesities, morbid (HCC)    Osteoarthritis     Current Outpatient Medications on File Prior to Visit  Medication Sig Dispense Refill   apixaban  (ELIQUIS ) 5 MG TABS tablet TAKE 1 TABLET BY MOUTH TWICE  DAILY . NEEDS CARDIOLOGY  APPOINTMENT FOR ELIQUIS  REFILLS, CALL OFFICE 30 tablet 0   benazepril  (LOTENSIN ) 20 MG tablet Take 1 tablet (20 mg total) by mouth daily. 30 tablet 11   Blood Glucose Monitoring Suppl (ONETOUCH VERIO IQ SYSTEM) W/DEVICE KIT Use to check blood sugar 2 times per day dx code 250.02 1 kit 0   CALCIUM  PO Take by mouth 2 (two) times daily.     diclofenac  Sodium (VOLTAREN  ARTHRITIS PAIN) 1 % GEL Apply 2 g topically 4 (four) times daily. 100 g 3   diltiazem  (CARDIZEM  CD) 360 MG 24 hr capsule TAKE 1 CAPSULE BY MOUTH DAILY 100 capsule 2   DULoxetine  (CYMBALTA ) 60 MG capsule Take 1 capsule (60 mg total) by mouth daily. 30 capsule 3   empagliflozin  (JARDIANCE ) 25 MG TABS tablet Take 1 tablet (25 mg total) by mouth daily before breakfast. 100 tablet 2   ergocalciferol (VITAMIN D2) 50000 UNITS capsule Take 50,000 Units by mouth once a week.     ferrous sulfate  325 (65 FE) MG EC tablet Take 1 tablet (325 mg total) by mouth 2 (two) times daily. (Patient taking differently: Take 325 mg by mouth daily with breakfast. Taking 325mg  once daily) 60 tablet 3   furosemide  (LASIX ) 40 MG tablet Take 1 tablet (40 mg total) by mouth daily. 30 tablet 2   gabapentin  (NEURONTIN ) 300 MG capsule Take 600 mg by mouth 3 (three) times daily.     HYDROcodone -acetaminophen  (NORCO/VICODIN) 5-325 MG  tablet Take 0.5-1 tablets by mouth 2 (two) times daily.     metFORMIN  (GLUCOPHAGE -XR) 500 MG 24 hr tablet Take 1 tablet (500 mg total) by mouth 2 (two) times daily between meals. 180 tablet 3   methimazole  (TAPAZOLE ) 5 MG tablet Take 1 tablet (5 mg total) by mouth daily. 90 tablet 3   methocarbamol  (ROBAXIN ) 500 MG tablet Take 2 tablets (1,000 mg total) by mouth every 6 (six) hours as needed for muscle spasms. 240 tablet 2   metoprolol  succinate (TOPROL -XL) 50 MG 24 hr tablet TAKE 1 TABLET BY MOUTH DAILY  WITH OR IMMEDIATELY FOLLOWING A  MEAL 90 tablet 3   mupirocin  ointment (BACTROBAN ) 2 % Apply 1 Application topically 2 (two) times daily. Apply to finger wound twice daily and cover with dry bandage 22 g 0   ONETOUCH VERIO test strip USE AS DIRECTED TO CHECK  BLOOD SUGAR TWICE DAILY 200 strip 3   rosuvastatin  (CRESTOR ) 10 MG tablet TAKE 1 TABLET BY MOUTH DAILY 60 tablet 5   Semaglutide , 2 MG/DOSE, (OZEMPIC , 2 MG/DOSE,) 8 MG/3ML SOPN INJECT SUBCUTANEOUSLY 2 MG EVERY WEEK 9 mL 3   No current facility-administered medications on file prior to visit.    Family History  Problem Relation Age of Onset   Heart attack Mother    Stroke Mother  Diabetes Mother    Hypertension Brother    Breast cancer Neg Hx     Social History   Socioeconomic History   Marital status: Married    Spouse name: Not on file   Number of children: Not on file   Years of education: Not on file   Highest education level: Not on file  Occupational History   Not on file  Tobacco Use   Smoking status: Never   Smokeless tobacco: Never  Vaping Use   Vaping status: Never Used  Substance and Sexual Activity   Alcohol use: No   Drug use: No   Sexual activity: Not Currently  Other Topics Concern   Not on file  Social History Narrative   Not on file   Social Drivers of Health   Financial Resource Strain: Low Risk  (06/20/2023)   Received from [facility]   Overall Financial Resource Strain  (CARDIA)    Difficulty of Paying Living Expenses: Not hard at all  Food Insecurity: No Food Insecurity (06/20/2023)   Received from [facility]   Hunger Vital Sign    Worried About Running Out of Food in the Last Year: Never true    Ran Out of Food in the Last Year: Never true  Transportation Needs: No Transportation Needs (06/20/2023)   Received from [facility] - Transportation    In the past 12 months, has lack of transportation kept you from medical appointments or from getting medications?: No    Lack of Transportation (Non-Medical): No  Physical Activity: Insufficiently Active (03/01/2023)   Exercise Vital Sign    Days of Exercise per Week: 7 days    Minutes of Exercise per Session: 20 min  Stress: No Stress Concern Present (03/01/2023)   [employer] of Occupational Health - Occupational Stress Questionnaire    Feeling of Stress : Not at all  Social Connections: Moderately Isolated (03/01/2023)   Social Connection and Isolation Panel [NHANES]    Frequency of Communication with Friends and Family: More than three times a week    Frequency of Social Gatherings with Friends and Family: Once a week    Attends Religious Services: More than 4 times per year    Active Member of [employer] or Organizations: No    Attends Banker Meetings: Never    Marital Status: Widowed  Intimate Partner Violence: Not At Risk (03/01/2023)   Humiliation, Afraid, Rape, and Kick questionnaire    Fear of Current or Ex-Partner: No    Emotionally Abused: No    Physically Abused: No    Sexually Abused: No   Review of Systems: ROS negative except for what is noted on the assessment and plan.  Vitals:   10/10/23 1403 10/10/23 1436  BP: (!) 147/67 121/76  Pulse: 67 85  SpO2: 100%   Weight: 187 lb 4.8 oz (85 kg)   Height: 5\' 3"  (1.6 m)    Physical Exam: Well-appearing female, no acute distress Left middle finger with pallor and numbness from  the distal tip to the DIP joint; no tenderness to palpation; open ulceration on the palmar aspect of the digit, blistering on the dorsal aspect of the digit; there is to palpation down the flexor tendon to the MCP joint; the rest the hand feels well-perfused, no erythema, swelling, or tenderness to palpation were noted.  No tenderness to palpation along the dorsal aspect of the hand. No drainage present. ROM in tact.  Assessment & Plan:   Patient discussed with Dr. Broadus Canes  Cellulitis of left finger Patient is following up again for cellulitis of her finger on her left hand.  She was seen in the office on 5/14 and then 5/16 for concerns of pain and swelling in the finger.  She received 1 dose of Rocephin  in the office and was discharged with 10 days of Bactrim , which she has completed.  Her kidney function worsened following the Bactrim , which has been trended over a few weeks.  We will recheck her kidney function today.    As for her finger, it seems to have progressed since her last visit here.  She now has numbness and pallor over much of the distal left middle digit, with deep ulceration on the lateral palmar aspect.  There is some blistering on the dorsal aspect of the distal digit.  She is not currently in pain, but says she has severe pain at night.  Her tenderness to palpation extends approximately to the MCP joint of the same finger.  Her hand otherwise is grossly normal. She denies any systemic symptoms including fever, chills, or fatigue.   She has been wrapping it twice and using mupirocin  twice a day, which I have instructed her to stop for now.  Will keep it covered with gauze, but will hold off on ointment.  I am concerned that she now has significant vascular compromise to this area and feels she needs to be evaluated by the hand surgeon soon.   She would prefer to avoid the emergency department, so I have recommended she go to [facility] walk-in urgent care for further  evaluation, and have given her the directions to their location.   Dorthy Gavia, MD [facility] Internal Medicine, PGY-1 Phone: [phone] Date 10/10/2023 Time 3:31 PM

## 2023-10-10 NOTE — Patient Instructions (Addendum)
 Thank you, Ms.[name] for allowing us  to provide your care today. Today we discussed your finger infection.    Please go to [facility] urgent care at [address], [address], [place], [place] [address]  I have also made an appointment for you on at Tuesday, June 10th at 2:15pm Dr. Merlinda Starling at [facility]. The address is [address] [place],  [place]  [address].   In the meantime, please hold off on using further ointment, but keep your finger wound covered and dry.   I have ordered the following labs for you:   Lab Orders         BMP8+Anion Gap         CBC no Diff      I will call you as soon as I have the results.   Follow up: 4 weeks  We look forward to seeing you next time. Please call our clinic at [phone] if you have any questions or concerns. The best time to call is Monday-Friday from 9am-4pm, but there is someone available 24/7. If after hours or the weekend, call the main hospital number and ask for the Internal Medicine Resident On-Call. If you need medication refills, please notify your pharmacy one week in advance and they will send us  a request.   Thank you for trusting me with your care. Wishing you the best!   Dorthy Gavia, MD [facility] Internal Medicine Center

## 2023-10-10 NOTE — Assessment & Plan Note (Addendum)
 Patient is following up again for cellulitis of her finger on her left hand.  She was seen in the office on 5/14 and then 5/16 for concerns of pain and swelling in the finger.  She received 1 dose of Rocephin  in the office and was discharged with 10 days of Bactrim , which she has completed.  Her kidney function worsened following the Bactrim , which has been trended over a few weeks.  We will recheck her kidney function today.    As for her finger, it seems to have progressed since her last visit here.  She now has numbness and pallor over much of the distal left middle digit, with deep ulceration on the lateral palmar aspect.  There is some blistering on the dorsal aspect of the distal digit.  She is not currently in pain, but says she has severe pain at night.  Her tenderness to palpation extends approximately to the MCP joint of the same finger.  Her hand otherwise is grossly normal. She denies any systemic symptoms including fever, chills, or fatigue.   She has been wrapping it twice and using mupirocin  twice a day, which I have instructed her to stop for now.  Will keep it covered with gauze, but will hold off on ointment.  I am concerned that she now has significant vascular compromise to this area and feels she needs to be evaluated by the hand surgeon soon.   She would prefer to avoid the emergency department, so I have recommended she go to [facility] walk-in urgent care for further evaluation, and have given her the directions to their location.

## 2023-10-11 LAB — CBC
Hematocrit: 42.2 % (ref 34.0–46.6)
Hemoglobin: 13.3 g/dL (ref 11.1–15.9)
MCH: 29.7 pg (ref 26.6–33.0)
MCHC: 31.5 g/dL (ref 31.5–35.7)
MCV: 94 fL (ref 79–97)
Platelets: 253 10*3/uL (ref 150–450)
RBC: 4.48 x10E6/uL (ref 3.77–5.28)
RDW: 14.3 % (ref 11.7–15.4)
WBC: 4.9 10*3/uL (ref 3.4–10.8)

## 2023-10-11 LAB — BMP8+ANION GAP
Anion Gap: 20 mmol/L — ABNORMAL HIGH (ref 10.0–18.0)
BUN/Creatinine Ratio: 21 (ref 12–28)
BUN: 33 mg/dL — ABNORMAL HIGH (ref 8–27)
CO2: 17 mmol/L — ABNORMAL LOW (ref 20–29)
Calcium: 10.4 mg/dL — ABNORMAL HIGH (ref 8.7–10.3)
Chloride: 105 mmol/L (ref 96–106)
Creatinine, Ser: 1.59 mg/dL — ABNORMAL HIGH (ref 0.57–1.00)
Glucose: 217 mg/dL — ABNORMAL HIGH (ref 70–99)
Potassium: 4.4 mmol/L (ref 3.5–5.2)
Sodium: 142 mmol/L (ref 134–144)
eGFR: 34 mL/min/{1.73_m2} — ABNORMAL LOW (ref >59)

## 2023-10-12 ENCOUNTER — Other Ambulatory Visit

## 2023-10-13 LAB — BASIC METABOLIC PANEL WITH GFR
BUN/Creatinine Ratio: 21 (calc) (ref 6–22)
BUN: 28 mg/dL — ABNORMAL HIGH (ref 7–25)
CO2: 26 mmol/L (ref 20–32)
Calcium: 9.8 mg/dL (ref 8.6–10.4)
Chloride: 106 mmol/L (ref 98–110)
Creat: 1.33 mg/dL — ABNORMAL HIGH (ref 0.60–1.00)
Glucose, Bld: 127 mg/dL — ABNORMAL HIGH (ref 65–99)
Potassium: 4.4 mmol/L (ref 3.5–5.3)
Sodium: 141 mmol/L (ref 135–146)
eGFR: 43 mL/min/{1.73_m2} — ABNORMAL LOW (ref >=60)

## 2023-10-13 LAB — TSH: TSH: 1.61 m[IU]/L (ref 0.40–4.50)

## 2023-10-13 LAB — HEMOGLOBIN A1C
Hgb A1c MFr Bld: 7.8 % — ABNORMAL HIGH (ref <5.7)
Mean Plasma Glucose: 177 mg/dL
eAG (mmol/L): 9.8 mmol/L

## 2023-10-13 LAB — T4, FREE: Free T4: 1.4 ng/dL (ref 0.8–1.8)

## 2023-10-13 LAB — T3, FREE: T3, Free: 2.9 pg/mL (ref 2.3–4.2)

## 2023-10-15 ENCOUNTER — Ambulatory Visit: Payer: Self-pay | Admitting: Endocrinology

## 2023-10-15 NOTE — Progress Notes (Signed)
 Internal Medicine Clinic Attending  I was physically present during the key portions of the resident provided service and participated in the medical decision making of patient's management care. I reviewed pertinent patient test results.  The assessment, diagnosis, and plan were formulated together and I agree with the documentation in the resident's note.  Finger had extensive maceration around the ulceration from ointment under dressing.  The cool pallor is concerning.  Dermis of finger tip appears to have underlying blood blister which is beginning to harden.  She indeed needs expert opinion from hand surgeon.    Sherol Dixie, MD

## 2023-10-15 NOTE — Progress Notes (Unsigned)
 Cardiology Office Note:  .   Date:  10/23/2023  ID:  Megan Fischer, DOB 1951/04/17, MRN [id number] PCP: Maxie Spaniel, MD  [facility] Providers Cardiologist:  Ahmad Alert, MD { Click to update primary MD,subspecialty MD or APP then REFRESH:1}  History of Present Illness: .   Megan Fischer is a 73 y.o. African-American female patient with primary hypertension, diabetes mellitus With stage IIIa chronic kidney disease, persistent atrial fibrillation, essentially remains asymptomatic and presently on apixaban  5 mg p.o. twice daily presents for annual follow-up.  Echocardiogram on 08/25/2021 revealed normal LVEF without significant valvular abnormality and myocardial perfusion scan on 08/26/2021 revealed no evidence of ischemia with EF 66%.  Patient is new to me, previously being followed by Dr. Ahmad Alert.  Discussed the use of AI scribe software for clinical note transcription with the patient, who gave verbal consent to proceed.  History of Present Illness   Labs   Lab Results  Component Value Date   CHOL 201 (H) 03/01/2023   HDL 108 03/01/2023   LDLCALC 83 03/01/2023   TRIG 56 03/01/2023   CHOLHDL 1.9 03/01/2023   Lab Results  Component Value Date   NA 141 10/12/2023   K 4.4 10/12/2023   CO2 26 10/12/2023   GLUCOSE 127 (H) 10/12/2023   BUN 28 (H) 10/12/2023   CREATININE 1.33 (H) 10/12/2023   CALCIUM  9.8 10/12/2023   GFR 48.83 (L) 01/16/2023   EGFR 43 (L) 10/12/2023   GFRNONAA 40 (L) 08/08/2023      Latest Ref Rng & Units 10/12/2023    8:59 AM 10/10/2023    3:26 PM 10/04/2023    8:50 AM  BMP  Glucose 65 - 99 mg/dL 409  811  914   BUN 7 - 25 mg/dL 28  33  27   Creatinine 0.60 - 1.00 mg/dL 7.82  9.56  2.13   BUN/Creat Ratio 6 - 22 (calc) 21  21  18    Sodium 135 - 146 mmol/L 141  142  144   Potassium 3.5 - 5.3 mmol/L 4.4  4.4  4.9   Chloride 98 - 110 mmol/L 106  105  108   CO2 20 - 32 mmol/L 26  17  15    Calcium  8.6 - 10.4 mg/dL 9.8  08.6  57.8        Latest Ref Rng & Units 10/10/2023    3:26 PM 08/08/2023    8:13 AM 06/19/2023    9:31 AM  CBC  WBC 3.4 - 10.8 x10E3/uL 4.9  6.3  4.0   Hemoglobin 11.1 - 15.9 g/dL 46.9  62.9  52.8   Hematocrit 34.0 - 46.6 % 42.2  39.0  41.4   Platelets 150 - 450 x10E3/uL 253  272  235    Lab Results  Component Value Date   HGBA1C 7.8 (H) 10/12/2023    Lab Results  Component Value Date   TSH 1.61 10/12/2023    External Labs:  ***  ROS  ***ROS  Physical Exam:   VS:  There were no vitals taken for this visit.   Wt Readings from Last 3 Encounters:  10/18/23 190 lb (86.2 kg)  10/10/23 187 lb 4.8 oz (85 kg)  09/19/23 197 lb 14.4 oz (89.8 kg)    ***Physical Exam Studies Reviewed: .    *** EKG:       EKG 09/19/2023: Atrial fibrillation with controlled ventricular sponsor rate of 78 bpm, poor R wave progression, nonspecific T abnormality.  No  significant change from 04/22/2022.  Medications ordered    No orders of the defined types were placed in this encounter.    ASSESSMENT AND PLAN: .      ICD-10-CM   1. Permanent atrial fibrillation (HCC)  I48.21     2. Primary hypertension  I10      Click Here to Calculate/Change CHADS2VASc Score The patient's CHADS2-VASc score is 4, indicating a 4.8% annual risk of stroke.  Therefore, anticoagulation is recommended.   CHF History: No HTN History: Yes Diabetes History: Yes Stroke History: No Vascular Disease History: No   Assessment and Plan Assessment & Plan      Signed,  Knox Perl, MD, [facility] 10/23/2023, 8:31 AM [facility] [address] [place], [place] [address] Phone: [phone]. Fax:  [phone]

## 2023-10-16 ENCOUNTER — Ambulatory Visit: Admitting: Orthopedic Surgery

## 2023-10-17 ENCOUNTER — Encounter: Payer: Self-pay | Admitting: *Deleted

## 2023-10-18 ENCOUNTER — Ambulatory Visit: Admitting: Endocrinology

## 2023-10-18 ENCOUNTER — Encounter: Payer: Self-pay | Admitting: Endocrinology

## 2023-10-18 VITALS — BP 130/70 | HR 78 | Resp 20 | Ht 63.0 in | Wt 190.0 lb

## 2023-10-18 DIAGNOSIS — E1165 Type 2 diabetes mellitus with hyperglycemia: Secondary | ICD-10-CM | POA: Diagnosis not present

## 2023-10-18 DIAGNOSIS — Z7985 Long-term (current) use of injectable non-insulin antidiabetic drugs: Secondary | ICD-10-CM

## 2023-10-18 DIAGNOSIS — E042 Nontoxic multinodular goiter: Secondary | ICD-10-CM

## 2023-10-18 DIAGNOSIS — E0590 Thyrotoxicosis, unspecified without thyrotoxic crisis or storm: Secondary | ICD-10-CM

## 2023-10-18 DIAGNOSIS — E7800 Pure hypercholesterolemia, unspecified: Secondary | ICD-10-CM

## 2023-10-18 DIAGNOSIS — Z7984 Long term (current) use of oral hypoglycemic drugs: Secondary | ICD-10-CM

## 2023-10-18 MED ORDER — ROSUVASTATIN CALCIUM 10 MG PO TABS: 10.0000 mg | ORAL_TABLET | Freq: Every day | ORAL | 3 refills | Status: DC

## 2023-10-18 MED ORDER — EMPAGLIFLOZIN 25 MG PO TABS: 25.0000 mg | ORAL_TABLET | Freq: Every day | ORAL | 3 refills | Status: DC

## 2023-10-18 MED ORDER — GLIMEPIRIDE 1 MG PO TABS: 1.0000 mg | ORAL_TABLET | Freq: Every day | ORAL | 4 refills | Status: DC

## 2023-10-18 NOTE — Progress Notes (Signed)
 Outpatient Endocrinology Note [place] Rawson Minix, MD  10/18/23  Patient's Name: Megan Fischer    DOB: 1950/10/30    MRN: [id number]                                                    REASON OF VISIT: Follow up of type 2 diabetes mellitus  PCP: Maxie Spaniel, MD  HISTORY OF PRESENT ILLNESS:   Megan Fischer is a 73 y.o. old female with past medical history listed below, is here for follow up of type 2 diabetes mellitus /subclinical hyperthyroidism / multinodular goiter.   Pertinent Diabetes History: Patient was diagnosed with type 2 diabetes mellitus in 2000.  She had been on various regimen of diabetes treatment in the past including Byetta and generally had done better with GLP-1 receptor agonist medications.    Chronic Diabetes Complications : Retinopathy: left yes. Last ophthalmology exam was done on annually.  Nephropathy: CKD present, on benzepril /Jardiance .  Following with nephrology. Peripheral neuropathy: no Coronary artery disease: no Stroke: no  Relevant comorbidities and cardiovascular risk factors: Obesity: yes Body mass index is 33.66 kg/m.  Hypertension: yes, following with cardiology. Hyperlipidemia. Yes, on a statin.  Current / Home Diabetic regimen includes: Jardiance  25 mg daily. Metformin  extended release 500 mg two times a day.  Ozempic  2 mg weekly.  Prior diabetic medications: Victoza   since 2013 until 2019 Amaryl /glimepiride  is stopped due to hypoglycemia in 2015.  Glycemic data:   Forget to bring glucometer in the clinic today.  She reports blood sugar mostly in the range of 100-160 range.  Hypoglycemia: Patient has no hypoglycemic episodes. Patient has hypoglycemia awareness.  Factors modifying glucose control: 1.  Diabetic diet assessment: 3 meals a day.  2.  Staying active or exercising: No formal exercise  3.  Medication compliance: compliant all of the time.  # Multiple thyroid  nodules /multinodular goiter /subclinical  hyperthyroidism: Patient has longstanding history of multinodular goiter from 2006, per office note.  She has mildly low TSH frequently with normal free T4 and free T3 for several years. -RAI uptake and scan in June 27, 2004 : There was slightly enlarged thyroid  with area of relatively low uptake in the somewhat enlarged area of the right mid lobe of the thyroid .  Uptake was in the lower normal range. -Ultrasound thyroid  in October 20, 2013 showed a dominant 5.1 cm heterogeneous nodule is noted in the midportion of the right lobe of the thyroid , this was corresponding to the relative photopenic area in the right lobe seen on the prior nuclear exam from 2006.  Multiple smaller less than 1 cm nodules are noted scattered throughout the right lobe of the thyroid .  A 3.3 cm nodule was noted in the midportion of the left lobe of the thyroid . -Patient had FNA of bilateral dominant thyroid  nodules in November 13, 2013, cytology benign consistent with nonneoplastic goiter. -Patient has no follow-up ultrasound after 2015.  # In September 2024, patient remained subclinical hyperthyroid and started on methimazole  5 mg daily.  # She has history of atrial fibrillation currently on Eliquis , following with cardiology.  Interval history  Patient reports having GI upset and occasional diarrhea on taking metformin  even with 1 tablet daily when she tried caused GI upset and she wants to stop it.  Hemoglobin A1c mildly elevated to 7.8%.  Renal  function is stable.  Recent laboratory results reviewed.  Thyroid  function test normal.  She has been taking methimazole  5 mg daily.  Diabetes regimen is reviewed and noted above.  No other complaints today.  REVIEW OF SYSTEMS As per history of present illness.   PAST MEDICAL HISTORY: Past Medical History:  Diagnosis Date   Atrial fibrillation (HCC)    Chronic disease anemia 03/29/2011   Diabetes mellitus    Dyslipidemia    Hypertension    Obesities, morbid (HCC)     Osteoarthritis     PAST SURGICAL HISTORY: Past Surgical History:  Procedure Laterality Date   CHOLECYSTECTOMY     REPLACEMENT TOTAL KNEE BILATERAL     SHOULDER SURGERY     RIGHT SHOULDER   TUBAL LIGATION      ALLERGIES: Allergies  Allergen Reactions   Aspirin     Other reaction(s): Unknown   Codeine    Fentanyl  Other (See Comments)    hypoxia   Oxycodone Hcl    Penicillins    Chlorhexidine Gluconate Rash    FAMILY HISTORY:  Family History  Problem Relation Age of Onset   Heart attack Mother    Stroke Mother    Diabetes Mother    Hypertension Brother    Breast cancer Neg Hx     SOCIAL HISTORY: Social History   Socioeconomic History   Marital status: Married    Spouse name: Not on file   Number of children: Not on file   Years of education: Not on file   Highest education level: Not on file  Occupational History   Not on file  Tobacco Use   Smoking status: Never   Smokeless tobacco: Never  Vaping Use   Vaping status: Never Used  Substance and Sexual Activity   Alcohol use: No   Drug use: No   Sexual activity: Not Currently  Other Topics Concern   Not on file  Social History Narrative   Not on file   Social Drivers of Health   Financial Resource Strain: Low Risk  (06/20/2023)   Received from [facility]   Overall Financial Resource Strain (CARDIA)    Difficulty of Paying Living Expenses: Not hard at all  Food Insecurity: No Food Insecurity (06/20/2023)   Received from [facility]   Hunger Vital Sign    Worried About Running Out of Food in the Last Year: Never true    Ran Out of Food in the Last Year: Never true  Transportation Needs: No Transportation Needs (06/20/2023)   Received from [facility] - Transportation    In the past 12 months, has lack of transportation kept you from medical appointments or from getting medications?: No    Lack of Transportation (Non-Medical): No   Physical Activity: Insufficiently Active (03/01/2023)   Exercise Vital Sign    Days of Exercise per Week: 7 days    Minutes of Exercise per Session: 20 min  Stress: No Stress Concern Present (03/01/2023)   [employer] of Occupational Health - Occupational Stress Questionnaire    Feeling of Stress : Not at all  Social Connections: Moderately Isolated (03/01/2023)   Social Connection and Isolation Panel    Frequency of Communication with Friends and Family: More than three times a week    Frequency of Social Gatherings with Friends and Family: Once a week    Attends Religious Services: More than 4 times per year    Active Member of  Clubs or Organizations: No    Attends Banker Meetings: Never    Marital Status: Widowed    MEDICATIONS:  Current Outpatient Medications  Medication Sig Dispense Refill   apixaban  (ELIQUIS ) 5 MG TABS tablet TAKE 1 TABLET BY MOUTH TWICE  DAILY . NEEDS CARDIOLOGY  APPOINTMENT FOR ELIQUIS  REFILLS, CALL OFFICE 30 tablet 0   benazepril  (LOTENSIN ) 20 MG tablet Take 1 tablet (20 mg total) by mouth daily. 30 tablet 11   Blood Glucose Monitoring Suppl (ONETOUCH VERIO IQ SYSTEM) W/DEVICE KIT Use to check blood sugar 2 times per day dx code 250.02 1 kit 0   CALCIUM  PO Take by mouth 2 (two) times daily.     diclofenac  Sodium (VOLTAREN  ARTHRITIS PAIN) 1 % GEL Apply 2 g topically 4 (four) times daily. 100 g 3   diltiazem  (CARDIZEM  CD) 360 MG 24 hr capsule TAKE 1 CAPSULE BY MOUTH DAILY 100 capsule 2   DULoxetine  (CYMBALTA ) 60 MG capsule Take 1 capsule (60 mg total) by mouth daily. 30 capsule 3   ergocalciferol (VITAMIN D2) 50000 UNITS capsule Take 50,000 Units by mouth once a week.     ferrous sulfate  325 (65 FE) MG EC tablet Take 1 tablet (325 mg total) by mouth 2 (two) times daily. 60 tablet 3   furosemide  (LASIX ) 40 MG tablet Take 1 tablet (40 mg total) by mouth daily. 30 tablet 2   gabapentin  (NEURONTIN ) 300 MG capsule Take 600 mg by mouth 3 (three)  times daily.     glimepiride  (AMARYL ) 1 MG tablet Take 1 tablet (1 mg total) by mouth daily with breakfast. 90 tablet 4   HYDROcodone -acetaminophen  (NORCO/VICODIN) 5-325 MG tablet Take 0.5-1 tablets by mouth 2 (two) times daily.     methimazole  (TAPAZOLE ) 5 MG tablet Take 1 tablet (5 mg total) by mouth daily. 90 tablet 3   methocarbamol  (ROBAXIN ) 500 MG tablet Take 2 tablets (1,000 mg total) by mouth every 6 (six) hours as needed for muscle spasms. 240 tablet 2   metoprolol  succinate (TOPROL -XL) 50 MG 24 hr tablet TAKE 1 TABLET BY MOUTH DAILY  WITH OR IMMEDIATELY FOLLOWING A  MEAL 90 tablet 3   mupirocin  ointment (BACTROBAN ) 2 % Apply 1 Application topically 2 (two) times daily. Apply to finger wound twice daily and cover with dry bandage 22 g 0   ONETOUCH VERIO test strip USE AS DIRECTED TO CHECK  BLOOD SUGAR TWICE DAILY 200 strip 3   Semaglutide , 2 MG/DOSE, (OZEMPIC , 2 MG/DOSE,) 8 MG/3ML SOPN INJECT SUBCUTANEOUSLY 2 MG EVERY WEEK 9 mL 3   empagliflozin  (JARDIANCE ) 25 MG TABS tablet Take 1 tablet (25 mg total) by mouth daily before breakfast. 100 tablet 3   rosuvastatin  (CRESTOR ) 10 MG tablet Take 1 tablet (10 mg total) by mouth daily. 90 tablet 3   No current facility-administered medications for this visit.    PHYSICAL EXAM: Vitals:   10/18/23 1021  BP: 130/70  Pulse: 78  Resp: 20  SpO2: 98%  Weight: 190 lb (86.2 kg)  Height: 5' 3 (1.6 m)     Body mass index is 33.66 kg/m.  Wt Readings from Last 3 Encounters:  10/18/23 190 lb (86.2 kg)  10/10/23 187 lb 4.8 oz (85 kg)  09/19/23 197 lb 14.4 oz (89.8 kg)    General: Well developed, well nourished female in no apparent distress.  HEENT: AT/[place], no external lesions.  Eyes: Conjunctiva clear and no icterus. Neck: Neck supple, thyromegaly present right > left, 2 x normal.  Right medical clavicle prominence.  Lungs: Respirations not labored Neurologic: Alert, oriented, normal speech Extremities / Skin: Dry. No sores or rashes noted.   No hand tremors  Psychiatric: Does not appear depressed or anxious  Diabetic Foot Exam - Simple   No data filed    LABS Reviewed Lab Results  Component Value Date   HGBA1C 7.8 (H) 10/12/2023   HGBA1C 7.0 (A) 06/22/2023   HGBA1C 6.8 (A) 01/16/2023   No results found for: FRUCTOSAMINE Lab Results  Component Value Date   CHOL 201 (H) 03/01/2023   HDL 108 03/01/2023   LDLCALC 83 03/01/2023   TRIG 56 03/01/2023   CHOLHDL 1.9 03/01/2023   Lab Results  Component Value Date   MICRALBCREAT 18.4 01/16/2023   MICRALBCREAT 8.3 02/03/2022   Lab Results  Component Value Date   CREATININE 1.33 (H) 10/12/2023   Lab Results  Component Value Date   GFR 48.83 (L) 01/16/2023    ASSESSMENT / PLAN  1. Uncontrolled type 2 diabetes mellitus with hyperglycemia (HCC)   2. Subclinical hyperthyroidism   3. Multinodular goiter   4. Uncontrolled type 2 diabetes mellitus with hyperglycemia, without long-term current use of insulin  (HCC)   5. Hypercholesterolemia      Diabetes Mellitus type 2, complicated by CKD /diabetic retinopathy. - Diabetic status / severity: uncontrolled  Lab Results  Component Value Date   HGBA1C 7.8 (H) 10/12/2023    - Hemoglobin A1c goal : <6.5%  - Medications: See below : See below. Diabetes regimen: Continue Ozempic  2 mg weekly. Continue Jardiance  25 mg daily. Stop metformin  extended release due to GI intolerance. Start glimepiride  1 mg daily.  - Home glucose testing: In the morning fasting and at bedtime.  Asked to bring glucometer in the follow-up visit.  - Discussed/ Gave Hypoglycemia treatment plan.  # Consult : None.  # Annual urine for microalbuminuria/ creatinine ratio, no microalbuminuria currently, continue ACE/AR/Benzapril /Jardiance .  Following with nephrology as well. Last  Lab Results  Component Value Date   MICRALBCREAT 18.4 01/16/2023    # Foot check nightly.  # Annual dilated diabetic eye exams.  Following with  ophthalmology.  She has retinopathy.  - Diet: Make healthy diabetic food choices - Life style / activity / exercise: Discussed.  2. Blood pressure  -  BP Readings from Last 1 Encounters:  10/18/23 130/70    - Control is in target.  - No change in current plans.  3. Lipid status / Hyperlipidemia - Last  Lab Results  Component Value Date   LDLCALC 83 03/01/2023   - Continue rosuvastatin  10 mg daily.  # Multiple thyroid  nodules/subclinical hyperthyroidism : -Patient is known to have multiple thyroid  nodules /multinodular goiter from 2006, at that time she had hyperthyroid symptoms, RAI uptake and scan showed relatively low uptake however had photopenic area on the right thyroid  lobe.  In June 2015 she had ultrasound thyroid  showed right dominant thyroid  nodule measuring 5.1 cm and left thyroid  nodule measuring 3.3 cm.  She had FNA of thyroid  nodules in 2015 with benign cytology. -She is currently on methimazole  5 mg daily.  He started in the beginning of September 2024. - Patient had no symptoms normal, continue current dose of methimazole  5 mg daily.  Plan: -Recent thyroid  function test with mildly low TSH.  Will not send the dose of methimazole  at this time.  Will continue to monitor continue current dose of methimazole  5 mg daily. -Discussed about repeating ultrasound thyroid  to monitor thyroid  nodules, patient  does not want to recheck ultrasound thyroid  at this time, she wants to hold off for now. -Will recheck thyroid  function test in next follow-up visit, if TSH remained low we will increase the dose of methimazole  at that time.  Diagnoses and all orders for this visit:  Uncontrolled type 2 diabetes mellitus with hyperglycemia (HCC) -     glimepiride  (AMARYL ) 1 MG tablet; Take 1 tablet (1 mg total) by mouth daily with breakfast. -     Basic metabolic panel with GFR -     Hemoglobin A1c -     Lipid panel -     Microalbumin / creatinine urine ratio  Subclinical  hyperthyroidism  Multinodular goiter  Uncontrolled type 2 diabetes mellitus with hyperglycemia, without long-term current use of insulin  (HCC) -     empagliflozin  (JARDIANCE ) 25 MG TABS tablet; Take 1 tablet (25 mg total) by mouth daily before breakfast.  Hypercholesterolemia -     rosuvastatin  (CRESTOR ) 10 MG tablet; Take 1 tablet (10 mg total) by mouth daily.    DISPOSITION Follow up in clinic in 3 months suggested.  Labs prior to follow-up visit as ordered.   All questions answered and patient verbalized understanding of the plan.  [place]ra[place] Tyce Delcid, MD [facility] [address] [place], [address] [place], [place] [address] Phone # [phone]  At least part of this note was generated using voice recognition software. Inadvertent word errors may have occurred, which were not recognized during the proofreading process.

## 2023-10-18 NOTE — Patient Instructions (Signed)
 Diabetes regimen: Ozempic  2 mg weekly. Jardiance  25 mg daily. Stop metformin  extended release.  Start glimepiride  1mg  daily.

## 2023-10-23 ENCOUNTER — Ambulatory Visit: Payer: Self-pay | Admitting: Student

## 2023-10-23 ENCOUNTER — Ambulatory Visit: Attending: Cardiology | Admitting: Cardiology

## 2023-10-23 DIAGNOSIS — I4821 Permanent atrial fibrillation: Secondary | ICD-10-CM

## 2023-10-23 DIAGNOSIS — I10 Essential (primary) hypertension: Secondary | ICD-10-CM

## 2023-10-24 ENCOUNTER — Encounter: Payer: Self-pay | Admitting: Cardiology

## 2023-10-30 ENCOUNTER — Ambulatory Visit: Payer: Self-pay | Admitting: Student

## 2023-11-05 NOTE — Progress Notes (Unsigned)
   Established Patient Office Visit  Subjective   Patient ID: ESTEFANIA KAMIYA, female    DOB: [birth year]  Age: 73 y.o. MRN: [id number]  CC: Left finger cellulitis follow up  HPI Mrs. Gunnarson is a 73 year old female with a past medical history of atrial fibrillation, hypertension, and type 2 diabetes who presented to the clinic for follow-up of left finger cellulitis, for which she had previously received Rocephin , Bactrim , and mupirocin  {History (Optional):23778}  ROS    Objective:     There were no vitals taken for this visit. {Vitals History (Optional):23777}  Physical Exam   No results found for any visits on 11/06/23.  {Labs (Optional):23779}  The ASCVD Risk score (Arnett DK, et al., 2019) failed to calculate for the following reasons:   The valid HDL cholesterol range is 20 to 100 mg/dL    Assessment & Plan:   Problem List Items Addressed This Visit   None   No follow-ups on file.    Armando Rossetti, MD

## 2023-11-06 ENCOUNTER — Other Ambulatory Visit: Payer: Self-pay

## 2023-11-06 ENCOUNTER — Ambulatory Visit: Payer: Self-pay

## 2023-11-06 VITALS — BP 156/131 | HR 97 | Temp 98.1°F | Ht 63.0 in | Wt 185.8 lb

## 2023-11-06 DIAGNOSIS — L03012 Cellulitis of left finger: Secondary | ICD-10-CM | POA: Diagnosis not present

## 2023-11-06 DIAGNOSIS — I10 Essential (primary) hypertension: Secondary | ICD-10-CM | POA: Diagnosis not present

## 2023-11-06 DIAGNOSIS — M79604 Pain in right leg: Secondary | ICD-10-CM | POA: Diagnosis not present

## 2023-11-06 DIAGNOSIS — M79605 Pain in left leg: Secondary | ICD-10-CM | POA: Diagnosis not present

## 2023-11-06 MED ORDER — ADULT BLOOD PRESSURE CUFF LG KIT: PACK | 0 refills | Status: DC

## 2023-11-06 NOTE — Patient Instructions (Signed)
 Thank you, Megan Fischer, for allowing us  to provide your care today. We discussed your ongoing knee pain, for which you consulted an orthopedic surgeon. It was determined that surgery is the best option. Before proceeding, we aim to optimize your chronic conditions. Your thyroid  and diabetes are currently well controlled under the care of your endocrinologist.  However, we noted episodes of elevated blood pressure. To monitor this, we have ordered a home blood pressure cuff for you. Please check your blood pressure twice daily and contact our office with the readings before July 10th, ahead of your next orthopedic appointment.  Your follow-up visit with us  is scheduled for three months from now. Please bring your blood pressure cuff to that visit so we can verify the readings in the office.  I have ordered the following labs for you:  Lab Orders  No laboratory test(s) ordered today     Tests ordered today:    Referrals ordered today:   Referral Orders  No referral(s) requested today     I have ordered the following medication/changed the following medications:   Stop the following medications: Medications Discontinued During This Encounter  Medication Reason   DULoxetine  (CYMBALTA ) 60 MG capsule Completed Course     Start the following medications: Meds ordered this encounter  Medications   Blood Pressure Monitoring (ADULT BLOOD PRESSURE CUFF LG) KIT    Sig: HTN    Dispense:  1 kit    Refill:  0     Follow up: 3 months   Remember: To bring your blood pressure cuff on your next follow up visit with us .  Should you have any questions or concerns please call the internal medicine clinic at [phone].    Drue Grow, M.D [facility]

## 2023-11-06 NOTE — Assessment & Plan Note (Addendum)
 The patient is experiencing bilateral leg pain due to a combination of radiculopathy from spinal stenosis and chronic bilateral knee pain. She previously underwent bilateral knee arthroplasty over 20 years ago. 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 Conservative management, including pain medication and physical therapy, has not been effective.  The plan is to proceed with knee arthroplasty. Before surgery, we are working to optimize her chronic conditions. Her blood sugar and thyroid  levels are well controlled by her endocrinologist. We are currently focusing on managing her blood pressure to ensure surgical readiness.

## 2023-11-06 NOTE — Assessment & Plan Note (Signed)
 Today's blood pressure was 156/131, and the patient had not yet taken her medication. She denies headache, chest pain, or vision changes. Despite reported adherence to her hypertension medications, we have observed episodes of elevated blood pressure and currently have no home readings available.  Given her upcoming knee arthroplasty, it is important to optimize her blood pressure. A home blood pressure cuff has been ordered, and she has been instructed to check her blood pressure twice daily and call our office with the readings before July 10th, which is the date of her next orthopedic appointment. She was also asked to bring the BP cuff to that visit so we can verify the readings in clinic.

## 2023-11-06 NOTE — Assessment & Plan Note (Signed)
  The left finger cellulitis has improved, with no discharge, induration, or pain present.

## 2023-11-19 ENCOUNTER — Other Ambulatory Visit: Payer: Self-pay | Admitting: Student

## 2023-11-19 ENCOUNTER — Other Ambulatory Visit: Payer: Self-pay | Admitting: Cardiovascular Disease

## 2023-11-19 DIAGNOSIS — I4891 Unspecified atrial fibrillation: Secondary | ICD-10-CM

## 2023-11-19 DIAGNOSIS — M4807 Spinal stenosis, lumbosacral region: Secondary | ICD-10-CM

## 2023-11-19 DIAGNOSIS — M79604 Pain in right leg: Secondary | ICD-10-CM

## 2023-11-19 DIAGNOSIS — G8929 Other chronic pain: Secondary | ICD-10-CM

## 2023-11-19 NOTE — Telephone Encounter (Signed)
 Prescription refill request for Eliquis  received. Indication: Afib  Last office visit: 10/23/23 [place])  Scr: 1.33 (10/12/23)  Age: 73 Weight: 84.3kg  Appropriate dose. Refill sent.

## 2023-11-19 NOTE — Progress Notes (Signed)
Internal Medicine Clinic Attending  I was physically present during the key portions of the resident provided service and participated in the medical decision making of patient's management care. I reviewed pertinent patient test results.  The assessment, diagnosis, and plan were formulated together and I agree with the documentation in the resident's note.  Mercie Eon, MD

## 2023-12-09 ENCOUNTER — Other Ambulatory Visit: Payer: Self-pay | Admitting: Student

## 2023-12-09 DIAGNOSIS — M79604 Pain in right leg: Secondary | ICD-10-CM

## 2023-12-09 DIAGNOSIS — M4807 Spinal stenosis, lumbosacral region: Secondary | ICD-10-CM

## 2023-12-09 DIAGNOSIS — M545 Low back pain, unspecified: Secondary | ICD-10-CM

## 2023-12-10 NOTE — Telephone Encounter (Signed)
 Medication discontinued 11/05/23.

## 2023-12-15 ENCOUNTER — Other Ambulatory Visit: Payer: Self-pay | Admitting: Endocrinology

## 2023-12-15 DIAGNOSIS — N1832 Chronic kidney disease, stage 3b: Secondary | ICD-10-CM

## 2023-12-18 ENCOUNTER — Encounter: Payer: Self-pay | Admitting: Cardiology

## 2023-12-18 ENCOUNTER — Ambulatory Visit: Attending: Cardiology | Admitting: Cardiology

## 2023-12-18 VITALS — BP 138/80 | HR 57 | Resp 16 | Ht 63.0 in | Wt 184.0 lb

## 2023-12-18 DIAGNOSIS — I10 Essential (primary) hypertension: Secondary | ICD-10-CM | POA: Diagnosis not present

## 2023-12-18 DIAGNOSIS — I4821 Permanent atrial fibrillation: Secondary | ICD-10-CM | POA: Diagnosis not present

## 2023-12-18 DIAGNOSIS — E782 Mixed hyperlipidemia: Secondary | ICD-10-CM

## 2023-12-18 DIAGNOSIS — D6869 Other thrombophilia: Secondary | ICD-10-CM | POA: Diagnosis not present

## 2023-12-18 DIAGNOSIS — R0789 Other chest pain: Secondary | ICD-10-CM

## 2023-12-18 MED ORDER — EZETIMIBE 10 MG PO TABS: 10.0000 mg | ORAL_TABLET | Freq: Every day | ORAL | 2 refills | Status: DC

## 2023-12-18 NOTE — Progress Notes (Signed)
 Cardiology Office Note:  .   Date:  12/18/2023  ID:  Megan Fischer, DOB 23-Feb-1951, MRN [id number] PCP: Bernadine Manos, MD  [facility] Providers Cardiologist:  Gordy Bergamo, MD   History of Present Illness: .   Megan Fischer is a 73 y.o. African-American female patient with primary hypertension, diabetes mellitus With stage IIIa chronic kidney disease, persistent atrial fibrillation, essentially remains asymptomatic and presently on apixaban  5 mg p.o. twice daily presents for follow-up of resting hypertension and chronic atrial fibrillation.  Previously followed Dr. Aleene Passe.  Cardiac Studies relevent.    Myocardial perfusion scan 08/26/2021: Low restudy, no evidence of ischemia, EF 66%.  Echocardiogram 08/25/2021: Normal LV systolic function, EF 60 to 65%.  No wall motion abnormality.  Mild LVH. Mild MR.  Discussed the use of AI scribe software for clinical note transcription with the patient, who gave verbal consent to proceed.  History of Present Illness Megan Fischer is a 73 year old female with atrial fibrillation and diabetes who presents for cardiovascular follow-up. She is accompanied by her son, Megan Fischer. She was previously under the care of Dr. Aleene Norlander, who has retired.  She experiences occasional chest pain that begins on the left side and radiates to the upper abdomen. The pain occurs infrequently and can happen while sitting or sleeping. She has noticed bruising on her legs, which she attributes to Eliquis . There is no blood in her stool or melena. She has experienced weight loss and is uncertain of the cause. Her current medications include Ozempic , Eliquis  5 mg twice daily, diltiazem  360 mg once daily, benazepril  20 mg once daily, Lasix  40 mg once daily, and rosuvastatin  20 mg. She has limited mobility and uses a walker. She lives alone most of the time, with her son currently staying with her. Her diet is inconsistent, often eating whatever she wants and  sometimes skipping meals. She cooks at home and rarely eats out.  Labs   Lab Results  Component Value Date   CHOL 201 (H) 03/01/2023   HDL 108 03/01/2023   LDLCALC 83 03/01/2023   TRIG 56 03/01/2023   CHOLHDL 1.9 03/01/2023   No results found for: LIPOA  Recent Labs    08/08/23 0813 09/26/23 1038 10/04/23 0850 10/10/23 1526 10/12/23 0859  NA 142   < > 144 142 141  K 3.8   < > 4.9 4.4 4.4  CL 106   < > 108* 105 106  CO2 25   < > 15* 17* 26  GLUCOSE 145*   < > 147* 217* 127*  BUN 30*   < > 27 33* 28*  CREATININE 1.40*   < > 1.48* 1.59* 1.33*  CALCIUM  10.0   < > 10.4* 10.4* 9.8  GFRNONAA 40*  --   --   --   --    < > = values in this interval not displayed.    Lab Results  Component Value Date   ALT 18 08/08/2023   AST 25 08/08/2023   ALKPHOS 103 08/08/2023   BILITOT 0.5 08/08/2023      Latest Ref Rng & Units 10/10/2023    3:26 PM 08/08/2023    8:13 AM 06/19/2023    9:31 AM  CBC  WBC 3.4 - 10.8 x10E3/uL 4.9  6.3  4.0   Hemoglobin 11.1 - 15.9 g/dL 86.6  87.1  86.5   Hematocrit 34.0 - 46.6 % 42.2  39.0  41.4   Platelets 150 - 450  x10E3/uL 253  272  235    Lab Results  Component Value Date   HGBA1C 7.8 (H) 10/12/2023    Lab Results  Component Value Date   TSH 1.61 10/12/2023    ROS  Review of Systems  Cardiovascular:  Positive for chest pain (left side radiates to left side of the abdomen). Negative for dyspnea on exertion and leg swelling.   Physical Exam:   VS:  BP 138/80 (BP Location: Left Arm, Patient Position: Sitting, Cuff Size: Large)   Pulse (!) 57   Resp 16   Ht 5' 3 (1.6 m)   Wt 184 lb (83.5 kg)   SpO2 100%   BMI 32.59 kg/m    Wt Readings from Last 3 Encounters:  12/18/23 184 lb (83.5 kg)  11/06/23 185 lb 12.8 oz (84.3 kg)  10/18/23 190 lb (86.2 kg)    BP Readings from Last 3 Encounters:  12/18/23 138/80  11/06/23 (!) 156/131  10/18/23 130/70   Physical Exam Neck:     Vascular: No carotid bruit or JVD.  Cardiovascular:     Rate and  Rhythm: Normal rate. Rhythm irregular.     Pulses:          Dorsalis pedis pulses are 0 on the right side and 0 on the left side.       Posterior tibial pulses are 0 on the right side and 0 on the left side.     Heart sounds: No murmur heard. Pulmonary:     Effort: Pulmonary effort is normal.     Breath sounds: Normal breath sounds.  Abdominal:     General: Bowel sounds are normal.     Palpations: Abdomen is soft.  Musculoskeletal:     Right lower leg: No edema.     Left lower leg: No edema.  Skin:    Capillary Refill: Capillary refill takes less than 2 seconds.    EKG:       EKG 09/19/2023: Atrial fibrillation with controlled ventricular sponsor rate of 78 bpm, poor R wave progression, nonspecific T abnormality. No significant change from 04/22/2022.   ASSESSMENT AND PLAN: .      ICD-10-CM   1. Permanent atrial fibrillation (HCC)  I48.21     2. Secondary hypercoagulable state (HCC)  D68.69     3. Primary hypertension  I10     4. Mixed hyperlipidemia  E78.2 ezetimibe  (ZETIA ) 10 MG tablet     Assessment & Plan Permanent atrial fibrillation on anticoagulation and rate control Permanent atrial fibrillation managed with anticoagulation and rate control. Currently on Eliquis  5 mg twice daily for anticoagulation and diltiazem  360 mg once daily for rate control. Also on metoprolol  succinate 50 mg once daily. No reported episodes of bleeding from anticoagulation, but some bruising noted on legs. - Continue Eliquis  5 mg twice daily - Continue diltiazem  360 mg once daily - Continue metoprolol  succinate 50 mg once daily  Hypertension Hypertension is relatively well controlled with current medication regimen. Current medications include benazepril  20 mg once daily, diltiazem  CD 360 mg once daily, and Lasix  40 mg once daily.  Hyperlipidemia with suboptimal LDL control Hyperlipidemia with LDL not at target level. Current LDL is 83 mg/dL, with a target of less than 70 mg/dL. Currently  taking rosuvastatin  (Crestor ) 20 mg daily. Discussed options to either increase the dose of rosuvastatin  or add another medication. She prefers to add another medication. - Add Zetia  10 mg to current rosuvastatin  therapy to lower LDL. Follow-up with PCP-for  management of hyperlipidemia  Type 2 diabetes mellitus on GLP-1 agonist therapy. Obesity with weight loss Obesity with recent weight loss, likely due to GLP-1 agonist therapy. Discussed the importance of portion control and slow eating to aid in weight loss and improve heart health.  Chest pain, intermittent, non-exertional Intermittent, non-exertional chest pain described as occurring on the left side and radiating to the upper part of the stomach. Occurs both at rest and during sleep. No clear pattern related to exertion.   Follow up: As she is stable from cardiac standpoint we will see her back on a as needed basis.  Signed,  Gordy Bergamo, MD, [facility] 12/18/2023, 12:18 PM [facility] [address] [place], [place] [address] Phone: [phone]. Fax:  [phone]

## 2023-12-18 NOTE — Patient Instructions (Signed)
 Medication Instructions:  Your physician has recommended you make the following change in your medication: Start ezetimibe  10 mg by mouth daily  *If you need a refill on your cardiac medications before your next appointment, please call your pharmacy*  Lab Work: none If you have labs (blood work) drawn today and your tests are completely normal, you will receive your results only by: MyChart Message (if you have MyChart) OR A paper copy in the mail If you have any lab test that is abnormal or we need to change your treatment, we will call you to review the results.  Testing/Procedures: none  Follow-Up: At [facility], you and your health needs are our priority.  As part of our continuing mission to provide you with exceptional heart care, our providers are all part of one team.  This team includes your primary Cardiologist (physician) and Advanced Practice Providers or APPs (Physician Assistants and Nurse Practitioners) who all work together to provide you with the care you need, when you need it.  Your next appointment:   As needed  Provider:   Gordy Bergamo, MD    We recommend signing up for the patient portal called MyChart.  Sign up information is provided on this After Visit Summary.  MyChart is used to connect with patients for Virtual Visits (Telemedicine).  Patients are able to view lab/test results, encounter notes, upcoming appointments, etc.  Non-urgent messages can be sent to your provider as well.   To learn more about what you can do with MyChart, go to [email or web address].   Other Instructions

## 2024-01-21 ENCOUNTER — Emergency Department (HOSPITAL_COMMUNITY)

## 2024-01-21 ENCOUNTER — Inpatient Hospital Stay (HOSPITAL_COMMUNITY)

## 2024-01-21 ENCOUNTER — Other Ambulatory Visit

## 2024-01-21 ENCOUNTER — Inpatient Hospital Stay (HOSPITAL_COMMUNITY)
Admission: EM | Admit: 2024-01-21 | Discharge: 2024-01-24 | DRG: 064 | Disposition: A | Discharge status: Rehabilitation Facility | Attending: Infectious Diseases | Admitting: Infectious Diseases

## 2024-01-21 DIAGNOSIS — M792 Neuralgia and neuritis, unspecified: Secondary | ICD-10-CM | POA: Diagnosis not present

## 2024-01-21 DIAGNOSIS — E1169 Type 2 diabetes mellitus with other specified complication: Secondary | ICD-10-CM | POA: Diagnosis not present

## 2024-01-21 DIAGNOSIS — R29711 NIHSS score 11: Secondary | ICD-10-CM | POA: Diagnosis not present

## 2024-01-21 DIAGNOSIS — Z96653 Presence of artificial knee joint, bilateral: Secondary | ICD-10-CM | POA: Diagnosis present

## 2024-01-21 DIAGNOSIS — I130 Hypertensive heart and chronic kidney disease with heart failure and stage 1 through stage 4 chronic kidney disease, or unspecified chronic kidney disease: Secondary | ICD-10-CM | POA: Diagnosis present

## 2024-01-21 DIAGNOSIS — I63532 Cerebral infarction due to unspecified occlusion or stenosis of left posterior cerebral artery: Principal | ICD-10-CM | POA: Diagnosis present

## 2024-01-21 DIAGNOSIS — E039 Hypothyroidism, unspecified: Secondary | ICD-10-CM | POA: Diagnosis present

## 2024-01-21 DIAGNOSIS — Z7984 Long term (current) use of oral hypoglycemic drugs: Secondary | ICD-10-CM | POA: Diagnosis not present

## 2024-01-21 DIAGNOSIS — I509 Heart failure, unspecified: Secondary | ICD-10-CM | POA: Diagnosis present

## 2024-01-21 DIAGNOSIS — E1122 Type 2 diabetes mellitus with diabetic chronic kidney disease: Secondary | ICD-10-CM | POA: Diagnosis present

## 2024-01-21 DIAGNOSIS — I63431 Cerebral infarction due to embolism of right posterior cerebral artery: Secondary | ICD-10-CM | POA: Diagnosis not present

## 2024-01-21 DIAGNOSIS — M25512 Pain in left shoulder: Secondary | ICD-10-CM | POA: Diagnosis not present

## 2024-01-21 DIAGNOSIS — I10 Essential (primary) hypertension: Secondary | ICD-10-CM | POA: Diagnosis present

## 2024-01-21 DIAGNOSIS — Z66 Do not resuscitate: Secondary | ICD-10-CM | POA: Diagnosis present

## 2024-01-21 DIAGNOSIS — I63533 Cerebral infarction due to unspecified occlusion or stenosis of bilateral posterior cerebral arteries: Secondary | ICD-10-CM | POA: Diagnosis present

## 2024-01-21 DIAGNOSIS — Z8249 Family history of ischemic heart disease and other diseases of the circulatory system: Secondary | ICD-10-CM | POA: Diagnosis not present

## 2024-01-21 DIAGNOSIS — N1832 Chronic kidney disease, stage 3b: Secondary | ICD-10-CM | POA: Diagnosis present

## 2024-01-21 DIAGNOSIS — Z885 Allergy status to narcotic agent status: Secondary | ICD-10-CM

## 2024-01-21 DIAGNOSIS — E118 Type 2 diabetes mellitus with unspecified complications: Secondary | ICD-10-CM | POA: Diagnosis not present

## 2024-01-21 DIAGNOSIS — E0590 Thyrotoxicosis, unspecified without thyrotoxic crisis or storm: Secondary | ICD-10-CM

## 2024-01-21 DIAGNOSIS — Z886 Allergy status to analgesic agent status: Secondary | ICD-10-CM | POA: Diagnosis not present

## 2024-01-21 DIAGNOSIS — M545 Low back pain, unspecified: Secondary | ICD-10-CM | POA: Diagnosis present

## 2024-01-21 DIAGNOSIS — Z6834 Body mass index (BMI) 34.0-34.9, adult: Secondary | ICD-10-CM | POA: Diagnosis not present

## 2024-01-21 DIAGNOSIS — D631 Anemia in chronic kidney disease: Secondary | ICD-10-CM | POA: Diagnosis present

## 2024-01-21 DIAGNOSIS — R414 Neurologic neglect syndrome: Secondary | ICD-10-CM | POA: Diagnosis present

## 2024-01-21 DIAGNOSIS — G8929 Other chronic pain: Secondary | ICD-10-CM | POA: Diagnosis present

## 2024-01-21 DIAGNOSIS — Z79899 Other long term (current) drug therapy: Secondary | ICD-10-CM

## 2024-01-21 DIAGNOSIS — Z823 Family history of stroke: Secondary | ICD-10-CM

## 2024-01-21 DIAGNOSIS — M419 Scoliosis, unspecified: Secondary | ICD-10-CM | POA: Diagnosis present

## 2024-01-21 DIAGNOSIS — I6389 Other cerebral infarction: Secondary | ICD-10-CM | POA: Diagnosis not present

## 2024-01-21 DIAGNOSIS — I639 Cerebral infarction, unspecified: Secondary | ICD-10-CM | POA: Diagnosis present

## 2024-01-21 DIAGNOSIS — Z7902 Long term (current) use of antithrombotics/antiplatelets: Secondary | ICD-10-CM | POA: Diagnosis not present

## 2024-01-21 DIAGNOSIS — R1310 Dysphagia, unspecified: Secondary | ICD-10-CM | POA: Diagnosis present

## 2024-01-21 DIAGNOSIS — Z794 Long term (current) use of insulin: Secondary | ICD-10-CM | POA: Diagnosis not present

## 2024-01-21 DIAGNOSIS — I779 Disorder of arteries and arterioles, unspecified: Secondary | ICD-10-CM | POA: Diagnosis not present

## 2024-01-21 DIAGNOSIS — I129 Hypertensive chronic kidney disease with stage 1 through stage 4 chronic kidney disease, or unspecified chronic kidney disease: Secondary | ICD-10-CM

## 2024-01-21 DIAGNOSIS — N179 Acute kidney failure, unspecified: Secondary | ICD-10-CM | POA: Diagnosis not present

## 2024-01-21 DIAGNOSIS — I482 Chronic atrial fibrillation, unspecified: Secondary | ICD-10-CM | POA: Diagnosis present

## 2024-01-21 DIAGNOSIS — H53462 Homonymous bilateral field defects, left side: Secondary | ICD-10-CM | POA: Diagnosis present

## 2024-01-21 DIAGNOSIS — R296 Repeated falls: Secondary | ICD-10-CM | POA: Diagnosis present

## 2024-01-21 DIAGNOSIS — K5900 Constipation, unspecified: Secondary | ICD-10-CM | POA: Diagnosis not present

## 2024-01-21 DIAGNOSIS — I63511 Cerebral infarction due to unspecified occlusion or stenosis of right middle cerebral artery: Secondary | ICD-10-CM

## 2024-01-21 DIAGNOSIS — E1151 Type 2 diabetes mellitus with diabetic peripheral angiopathy without gangrene: Secondary | ICD-10-CM | POA: Diagnosis not present

## 2024-01-21 DIAGNOSIS — Z833 Family history of diabetes mellitus: Secondary | ICD-10-CM | POA: Diagnosis not present

## 2024-01-21 DIAGNOSIS — E66811 Obesity, class 1: Secondary | ICD-10-CM | POA: Diagnosis present

## 2024-01-21 DIAGNOSIS — Z888 Allergy status to other drugs, medicaments and biological substances status: Secondary | ICD-10-CM

## 2024-01-21 DIAGNOSIS — Z7985 Long-term (current) use of injectable non-insulin antidiabetic drugs: Secondary | ICD-10-CM

## 2024-01-21 DIAGNOSIS — H547 Unspecified visual loss: Secondary | ICD-10-CM | POA: Diagnosis present

## 2024-01-21 DIAGNOSIS — E1129 Type 2 diabetes mellitus with other diabetic kidney complication: Secondary | ICD-10-CM | POA: Diagnosis present

## 2024-01-21 DIAGNOSIS — I63531 Cerebral infarction due to unspecified occlusion or stenosis of right posterior cerebral artery: Principal | ICD-10-CM | POA: Diagnosis present

## 2024-01-21 DIAGNOSIS — I69354 Hemiplegia and hemiparesis following cerebral infarction affecting left non-dominant side: Secondary | ICD-10-CM | POA: Diagnosis not present

## 2024-01-21 DIAGNOSIS — E0520 Thyrotoxicosis with toxic multinodular goiter without thyrotoxic crisis or storm: Secondary | ICD-10-CM | POA: Diagnosis present

## 2024-01-21 DIAGNOSIS — Z880 Allergy status to penicillin: Secondary | ICD-10-CM

## 2024-01-21 DIAGNOSIS — E042 Nontoxic multinodular goiter: Secondary | ICD-10-CM | POA: Diagnosis present

## 2024-01-21 DIAGNOSIS — M1711 Unilateral primary osteoarthritis, right knee: Secondary | ICD-10-CM | POA: Diagnosis present

## 2024-01-21 DIAGNOSIS — M25511 Pain in right shoulder: Secondary | ICD-10-CM | POA: Diagnosis not present

## 2024-01-21 DIAGNOSIS — E785 Hyperlipidemia, unspecified: Secondary | ICD-10-CM | POA: Diagnosis present

## 2024-01-21 DIAGNOSIS — R29715 NIHSS score 15: Secondary | ICD-10-CM | POA: Diagnosis present

## 2024-01-21 DIAGNOSIS — I69391 Dysphagia following cerebral infarction: Secondary | ICD-10-CM | POA: Diagnosis not present

## 2024-01-21 DIAGNOSIS — R29704 NIHSS score 4: Secondary | ICD-10-CM | POA: Diagnosis not present

## 2024-01-21 DIAGNOSIS — Z8679 Personal history of other diseases of the circulatory system: Secondary | ICD-10-CM | POA: Diagnosis not present

## 2024-01-21 DIAGNOSIS — N189 Chronic kidney disease, unspecified: Secondary | ICD-10-CM | POA: Diagnosis not present

## 2024-01-21 DIAGNOSIS — R29810 Facial weakness: Secondary | ICD-10-CM | POA: Diagnosis present

## 2024-01-21 DIAGNOSIS — I610 Nontraumatic intracerebral hemorrhage in hemisphere, subcortical: Secondary | ICD-10-CM | POA: Diagnosis present

## 2024-01-21 DIAGNOSIS — Z7901 Long term (current) use of anticoagulants: Secondary | ICD-10-CM | POA: Diagnosis not present

## 2024-01-21 DIAGNOSIS — Z7982 Long term (current) use of aspirin: Secondary | ICD-10-CM

## 2024-01-21 DIAGNOSIS — I6521 Occlusion and stenosis of right carotid artery: Secondary | ICD-10-CM | POA: Diagnosis present

## 2024-01-21 DIAGNOSIS — I69392 Facial weakness following cerebral infarction: Secondary | ICD-10-CM | POA: Diagnosis not present

## 2024-01-21 DIAGNOSIS — M791 Myalgia, unspecified site: Secondary | ICD-10-CM | POA: Diagnosis not present

## 2024-01-21 DIAGNOSIS — W19XXXA Unspecified fall, initial encounter: Secondary | ICD-10-CM | POA: Diagnosis not present

## 2024-01-21 DIAGNOSIS — I4891 Unspecified atrial fibrillation: Secondary | ICD-10-CM | POA: Diagnosis present

## 2024-01-21 DIAGNOSIS — G8194 Hemiplegia, unspecified affecting left nondominant side: Secondary | ICD-10-CM | POA: Diagnosis present

## 2024-01-21 DIAGNOSIS — W19XXXD Unspecified fall, subsequent encounter: Secondary | ICD-10-CM | POA: Diagnosis not present

## 2024-01-21 DIAGNOSIS — R29705 NIHSS score 5: Secondary | ICD-10-CM | POA: Diagnosis not present

## 2024-01-21 HISTORY — DX: Cerebral infarction due to unspecified occlusion or stenosis of right middle cerebral artery: I63.511

## 2024-01-21 HISTORY — DX: Cerebral infarction, unspecified: I63.9

## 2024-01-21 LAB — I-STAT CHEM 8, ED
BUN: 30 mg/dL — ABNORMAL HIGH (ref 8–23)
Calcium, Ion: 1.21 mmol/L (ref 1.15–1.40)
Chloride: 106 mmol/L (ref 98–111)
Creatinine, Ser: 1.4 mg/dL — ABNORMAL HIGH (ref 0.44–1.00)
Glucose, Bld: 190 mg/dL — ABNORMAL HIGH (ref 70–99)
HCT: 41 % (ref 36.0–46.0)
Hemoglobin: 13.9 g/dL (ref 12.0–15.0)
Potassium: 4.2 mmol/L (ref 3.5–5.1)
Sodium: 143 mmol/L (ref 135–145)
TCO2: 26 mmol/L (ref 22–32)

## 2024-01-21 LAB — DIFFERENTIAL
Abs Immature Granulocytes: 0.02 K/uL (ref 0.00–0.07)
Basophils Absolute: 0 K/uL (ref 0.0–0.1)
Basophils Relative: 0 %
Eosinophils Absolute: 0 K/uL (ref 0.0–0.5)
Eosinophils Relative: 0 %
Immature Granulocytes: 0 %
Lymphocytes Relative: 11 %
Lymphs Abs: 0.9 K/uL (ref 0.7–4.0)
Monocytes Absolute: 0.5 K/uL (ref 0.1–1.0)
Monocytes Relative: 6 %
Neutro Abs: 6.8 K/uL (ref 1.7–7.7)
Neutrophils Relative %: 83 %

## 2024-01-21 LAB — APTT: aPTT: 25 s (ref 24–36)

## 2024-01-21 LAB — COMPREHENSIVE METABOLIC PANEL WITH GFR
ALT: 21 U/L (ref 0–44)
AST: 31 U/L (ref 15–41)
Albumin: 3.7 g/dL (ref 3.5–5.0)
Alkaline Phosphatase: 95 U/L (ref 38–126)
Anion gap: 13 (ref 5–15)
BUN: 25 mg/dL — ABNORMAL HIGH (ref 8–23)
CO2: 26 mmol/L (ref 22–32)
Calcium: 9.7 mg/dL (ref 8.9–10.3)
Chloride: 102 mmol/L (ref 98–111)
Creatinine, Ser: 1.27 mg/dL — ABNORMAL HIGH (ref 0.44–1.00)
GFR, Estimated: 45 mL/min — ABNORMAL LOW (ref >60)
Glucose, Bld: 136 mg/dL — ABNORMAL HIGH (ref 70–99)
Potassium: 4 mmol/L (ref 3.5–5.1)
Sodium: 141 mmol/L (ref 135–145)
Total Bilirubin: 0.8 mg/dL (ref 0.0–1.2)
Total Protein: 8 g/dL (ref 6.5–8.1)

## 2024-01-21 LAB — CBC
HCT: 41.9 % (ref 36.0–46.0)
Hemoglobin: 13.2 g/dL (ref 12.0–15.0)
MCH: 28.8 pg (ref 26.0–34.0)
MCHC: 31.5 g/dL (ref 30.0–36.0)
MCV: 91.5 fL (ref 80.0–100.0)
Platelets: 224 K/uL (ref 150–400)
RBC: 4.58 MIL/uL (ref 3.87–5.11)
RDW: 15 % (ref 11.5–15.5)
WBC: 8.2 K/uL (ref 4.0–10.5)
nRBC: 0 % (ref 0.0–0.2)

## 2024-01-21 LAB — CBG MONITORING, ED
Glucose-Capillary: 193 mg/dL — ABNORMAL HIGH (ref 70–99)
Glucose-Capillary: 114 mg/dL — ABNORMAL HIGH (ref 70–99)
Glucose-Capillary: 86 mg/dL (ref 70–99)

## 2024-01-21 LAB — ECHOCARDIOGRAM COMPLETE
Area-P 1/2: 3.31 cm2
Calc EF: 56.6 %
S' Lateral: 2.8 cm
Single Plane A2C EF: 59.7 %
Single Plane A4C EF: 56.2 %
Weight: 3068.8 [oz_av]

## 2024-01-21 LAB — PROTIME-INR
INR: 1 (ref 0.8–1.2)
Prothrombin Time: 13.9 s (ref 11.4–15.2)

## 2024-01-21 LAB — RAPID URINE DRUG SCREEN, HOSP PERFORMED
Amphetamines: NOT DETECTED
Barbiturates: NOT DETECTED
Benzodiazepines: NOT DETECTED
Cocaine: NOT DETECTED
Opiates: NOT DETECTED
Tetrahydrocannabinol: NOT DETECTED

## 2024-01-21 LAB — GLUCOSE, CAPILLARY: Glucose-Capillary: 159 mg/dL — ABNORMAL HIGH (ref 70–99)

## 2024-01-21 LAB — ETHANOL: Alcohol, Ethyl (B): <15 mg/dL (ref <15)

## 2024-01-21 MED ORDER — METHIMAZOLE 5 MG PO TABS
5.0000 mg | ORAL_TABLET | Freq: Every day | ORAL | Status: DC
Start: 2024-01-21 — End: 2024-01-24
  Administered 2024-01-21 – 2024-01-24 (×4): 5 mg via ORAL
  Filled 2024-01-21 (×4): qty 1

## 2024-01-21 MED ORDER — CLOPIDOGREL BISULFATE 75 MG PO TABS
75.0000 mg | ORAL_TABLET | Freq: Every day | ORAL | Status: DC
Start: 2024-01-21 — End: 2024-01-23
  Administered 2024-01-21 – 2024-01-23 (×3): 75 mg via ORAL
  Filled 2024-01-21 (×3): qty 1

## 2024-01-21 MED ORDER — INSULIN ASPART 100 UNIT/ML IJ SOLN: 0.0000 [IU] | Freq: Every day | INTRAMUSCULAR | Status: DC

## 2024-01-21 MED ORDER — STROKE: EARLY STAGES OF RECOVERY BOOK
Freq: Once | Status: AC
  Filled 2024-01-21: qty 1

## 2024-01-21 MED ORDER — DICLOFENAC SODIUM 1 % EX GEL
2.0000 g | Freq: Four times a day (QID) | CUTANEOUS | Status: DC
  Administered 2024-01-22 – 2024-01-24 (×6): 2 g via TOPICAL
  Filled 2024-01-21: qty 100

## 2024-01-21 MED ORDER — IOHEXOL 350 MG/ML SOLN
100.0000 mL | Freq: Once | INTRAVENOUS | Status: AC | PRN
  Administered 2024-01-21: 100 mL via INTRAVENOUS

## 2024-01-21 MED ORDER — ROSUVASTATIN CALCIUM 5 MG PO TABS
10.0000 mg | ORAL_TABLET | Freq: Every day | ORAL | Status: DC
Start: 2024-01-21 — End: 2024-01-22
  Administered 2024-01-21 – 2024-01-22 (×2): 10 mg via ORAL
  Filled 2024-01-21 (×2): qty 2

## 2024-01-21 MED ORDER — EZETIMIBE 10 MG PO TABS
10.0000 mg | ORAL_TABLET | Freq: Every day | ORAL | Status: DC
  Administered 2024-01-21 – 2024-01-24 (×4): 10 mg via ORAL
  Filled 2024-01-21 (×4): qty 1

## 2024-01-21 MED ORDER — INSULIN ASPART 100 UNIT/ML IJ SOLN
0.0000 [IU] | Freq: Three times a day (TID) | INTRAMUSCULAR | Status: DC
  Administered 2024-01-22: 3 [IU] via SUBCUTANEOUS
  Administered 2024-01-23: 2 [IU] via SUBCUTANEOUS
  Administered 2024-01-24: 3 [IU] via SUBCUTANEOUS

## 2024-01-21 MED ORDER — LORAZEPAM 1 MG PO TABS
1.0000 mg | ORAL_TABLET | Freq: Once | ORAL | Status: AC | PRN
  Administered 2024-01-21: 1 mg via ORAL
  Filled 2024-01-21: qty 1

## 2024-01-21 NOTE — Plan of Care (Signed)

## 2024-01-21 NOTE — ED Triage Notes (Signed)
 BIB [facility] for left sided deficits reported by daughter at 630 am. LKW at 8pm last night. [facility] was called at 5am this morning as well for a fall but stated she did not hit her head. Leg weakness at baseline.  Endorses headache but no dizziness.  A&O x4 upon arrival.

## 2024-01-21 NOTE — Consult Note (Signed)
 NEUROLOGY CONSULT NOTE   Date of service: January 21, 2024 Patient Name: Megan Fischer MRN:  [id number] DOB:  [birth year] Chief Complaint: code stroke - left sided weakness Requesting Provider: Neysa Caron PARAS, DO  History of Present Illness  Megan Fischer is a 73 y.o. female with hx of atrial fibrillation on Eliquis  last dose yesterday, diabetes, hypertension, dyslipidemia, chronic back pain with bilateral lower extremity weakness at baseline brought in for evaluation as a code stroke manage Initial last known well 5:30 AM when she had a fall but did not come to the hospital.  Then family noticed that she has weakness on the left side and called EMS again who then brought her to the hospital. On further history taking, she went to bed somewhere around 8 PM yesterday which is her last known well.  Right as she woke up this morning and attempted to get out of bed, is when she fell. She had left-sided weakness, left facial droop, mild slurred speech and left-sided neglect along with right gaze preference for which a LVO positive code stroke was activated.  LKW: 8 PM on 01/20/2024 Modified rankin score: 3-Moderate disability-requires help but walks WITHOUT assistance-mostly due to back pain and leg weakness from back pain IV Thrombolysis: No-outside the window EVT: No-established stroke on CT head, possibly distal location and difficult approach of the vessel  NIHSS components Score: Comment  1a Level of Conscious 0[x]  1[]  2[]  3[]      1b LOC Questions 0[x]  1[]  2[]       1c LOC Commands 0[x]  1[]  2[]       2 Best Gaze 0[]  1[x]  2[]       3 Visual 0[]  1[]  2[x]  3[]      4 Facial Palsy 0[]  1[x]  2[]  3[]      5a Motor Arm - left 0[]  1[]  2[x]  3[]  4[]  UN[]    5b Motor Arm - Right 0[x]  1[]  2[]  3[]  4[]  UN[]    6a Motor Leg - Left 0[]  1[]  2[x]  3[]  4[]  UN[]    6b Motor Leg - Right 0[]  1[]  2[x]  3[]  4[]  UN[]    7 Limb Ataxia 0[x]  1[]  2[]  UN[]      8 Sensory 0[]  1[]  2[x]  UN[]      9 Best Language 0[x]  1[]  2[]  3[]       10 Dysarthria 0[]  1[x]  2[]  UN[]      11 Extinct. and Inattention 0[]  1[]  2[x]       TOTAL: 15      ROS  Comprehensive ROS performed and pertinent positives documented in HPI    Past History   Past Medical History:  Diagnosis Date   Atrial fibrillation (HCC)    Chronic disease anemia 03/29/2011   Diabetes mellitus    Dyslipidemia    Hypertension    Obesities, morbid (HCC)    Osteoarthritis     Past Surgical History:  Procedure Laterality Date   CHOLECYSTECTOMY     REPLACEMENT TOTAL KNEE BILATERAL     SHOULDER SURGERY     RIGHT SHOULDER   TUBAL LIGATION      Family History: Family History  Problem Relation Age of Onset   Heart attack Mother    Stroke Mother    Diabetes Mother    Hypertension Brother    Breast cancer Neg Hx     Social History  reports that she has never smoked. She has never used smokeless tobacco. She reports that she does not drink alcohol and does not use drugs.  Allergies  Allergen Reactions   Aspirin   Other reaction(s): Unknown   Codeine    Fentanyl  Other (See Comments)    hypoxia   Oxycodone Hcl    Penicillins    Chlorhexidine Gluconate Rash    Medications  No current facility-administered medications for this encounter.  Current Outpatient Medications:    apixaban  (ELIQUIS ) 5 MG TABS tablet, Take 1 tablet (5 mg total) by mouth 2 (two) times daily., Disp: 180 tablet, Rfl: 1   benazepril  (LOTENSIN ) 20 MG tablet, Take 1 tablet (20 mg total) by mouth daily., Disp: 30 tablet, Rfl: 11   Blood Glucose Monitoring Suppl (ONETOUCH VERIO IQ SYSTEM) W/DEVICE KIT, Use to check blood sugar 2 times per day dx code 250.02, Disp: 1 kit, Rfl: 0   Blood Pressure Monitoring (ADULT BLOOD PRESSURE CUFF LG) KIT, HTN, Disp: 1 kit, Rfl: 0   CALCIUM  PO, Take by mouth 2 (two) times daily., Disp: , Rfl:    diclofenac  Sodium (VOLTAREN  ARTHRITIS PAIN) 1 % GEL, Apply 2 g topically 4 (four) times daily., Disp: 100 g, Rfl: 3   diltiazem  (CARDIZEM  CD) 360 MG  24 hr capsule, TAKE 1 CAPSULE BY MOUTH DAILY, Disp: 100 capsule, Rfl: 2   empagliflozin  (JARDIANCE ) 25 MG TABS tablet, Take 1 tablet (25 mg total) by mouth daily before breakfast., Disp: 100 tablet, Rfl: 3   ergocalciferol (VITAMIN D2) 50000 UNITS capsule, Take 50,000 Units by mouth once a week., Disp: , Rfl:    ezetimibe  (ZETIA ) 10 MG tablet, Take 1 tablet (10 mg total) by mouth daily., Disp: 90 tablet, Rfl: 2   ferrous sulfate  325 (65 FE) MG EC tablet, Take 1 tablet (325 mg total) by mouth 2 (two) times daily., Disp: 60 tablet, Rfl: 3   furosemide  (LASIX ) 40 MG tablet, Take 1 tablet (40 mg total) by mouth daily., Disp: 30 tablet, Rfl: 2   glimepiride  (AMARYL ) 1 MG tablet, Take 1 tablet (1 mg total) by mouth daily with breakfast., Disp: 90 tablet, Rfl: 4   methimazole  (TAPAZOLE ) 5 MG tablet, Take 1 tablet (5 mg total) by mouth daily., Disp: 90 tablet, Rfl: 3   methocarbamol  (ROBAXIN ) 500 MG tablet, TAKE 2 TABLETS BY MOUTH EVERY 6  HOURS AS NEEDED FOR MUSCLE  SPASMS, Disp: 240 tablet, Rfl: 2   metoprolol  succinate (TOPROL -XL) 50 MG 24 hr tablet, TAKE 1 TABLET BY MOUTH DAILY  WITH OR IMMEDIATELY FOLLOWING A  MEAL, Disp: 90 tablet, Rfl: 3   mupirocin  ointment (BACTROBAN ) 2 %, Apply 1 Application topically 2 (two) times daily. Apply to finger wound twice daily and cover with dry bandage, Disp: 22 g, Rfl: 0   ONETOUCH VERIO test strip, USE AS DIRECTED TO CHECK  BLOOD SUGAR TWICE DAILY, Disp: 200 strip, Rfl: 3   rosuvastatin  (CRESTOR ) 10 MG tablet, Take 1 tablet (10 mg total) by mouth daily., Disp: 90 tablet, Rfl: 3   Semaglutide , 2 MG/DOSE, (OZEMPIC , 2 MG/DOSE,) 8 MG/3ML SOPN, INJECT SUBCUTANEOUSLY 2 MG EVERY WEEK, Disp: 9 mL, Rfl: 3  Vitals   Vitals:   01/21/24 0700  Weight: 87 kg    Body mass index is 33.98 kg/m.   Physical Exam   General: Awake alert in no distress HEENT: Normocephalic atraumatic Lungs: Clear Cardiovascular: Regular rate rhythm Abdomen nondistended  nontender Extremities: Swollen bilateral lower extremities Neurological exam Awake alert oriented x 3 Mild dysarthria No aphasia Cranial nerves: Pupils equal round reactive light, extraocular movements reveal a rightward gaze preference although she is able to cross midline but does not Daig her eyes all the  way to the left, she has complete left homonymous hemianopsia, mild left lower facial weakness, tongue and palate midline. Motor examination with minimal movement in the left upper extremity, right upper extremity full strength.  Bilateral lower extremities with drift before 5 seconds with left appearing somewhat weaker than the right. Sensation diminished on the left with extinction on double simultaneous stimulation Coordination examination reveals no dysmetria on the right, unable to perform on the left upper extremity.  Unable to perform on the lower extremities  Labs/Imaging/Neurodiagnostic studies   CBC:  Recent Labs  Lab 02-04-2024 0727  HGB 13.9  HCT 41.0   Basic Metabolic Panel:  Lab Results  Component Value Date   NA 143 February 04, 2024   K 4.2 02/04/2024   CO2 26 10/12/2023   GLUCOSE 190 (H) 02-04-2024   BUN 30 (H) 02-04-2024   CREATININE 1.40 (H) 2024/02/04   CALCIUM  9.8 10/12/2023   GFRNONAA 40 (L) 08/08/2023   GFRAA >60 05/17/2018   Lipid Panel:  Lab Results  Component Value Date   LDLCALC 83 03/01/2023   HgbA1c:  Lab Results  Component Value Date   HGBA1C 7.8 (H) 10/12/2023   CT Head without contrast(Personally reviewed): Moderate size acute to subacute right PCA territory infarction  CT angio Head and Neck with contrast(Personally reviewed): Occlusion of the right ICA at its origin with intracranial reconstitution.  Proximal right P2 occlusion.  Acute right PCA infarct with 33 cc penumbra.  Aortic atherosclerosis.  ASSESSMENT   Megan Fischer is a 73 y.o. female past medical history of atrial fibrillation on Eliquis  last dose of Eliquis  yesterday, and  other cerebrovascular risk factors as documented above, presenting for evaluation of left-sided weakness, right gaze preference, left-sided hemianopsia, mild dysarthria and mild left lower facial weakness concerning for right hemispheric infarct. CT head shows a moderate-sized acute to subacute right PCA territory infarct. CT angiography and perfusion study shows what appears to be a proximal right ICA occlusion with good intracranial flow and an acute right P2 occlusion.  Perfusion study shows a 33 cc penumbra but that appears to be overestimating the penumbra, since the CT head already shows an established infarct. Case was discussed in detail with the interventionalist regarding potential for thrombectomy.  Given the large size of the established infarct and a possible distal location of the vessel, decision was made not to offer thrombectomy. She was outside the window for IV thrombolysis.  Impression: Acute ischemic stroke, etiology likely cardioembolic given the history of atrial fibrillation  RECOMMENDATIONS  Admit to hospitalist Frequent neurochecks Telemetry MRI brain without contrast 2D echo A1c Lipid panel Hold Eliquis  for now Aspirin  81 PT Speech therapy OT Permissive hypertension-treat only if systolic blood pressures greater than 220 on a as needed basis for the next day or 2 and then start normalizing blood pressure to normotension on discharge.  Plan was discussed with Dr. Neysa, ED provider.  I spoke with daughter, Ms. Wilson over the phone, and updated her on the stroke that is seen on the CT head and the vascular occlusion.  I also discussed with her my discussion with the interventionalist and the inability to proceed with thrombectomy for the reasons documented above.  She expressed understanding.  Stroke team to follow  ______________________________________________________________________    Signed, Eligio Lav, MD [facility]  CRITICAL CARE  ATTESTATION Performed by: Eligio Lav, MD Total critical care time: 49 minutes Critical care time was exclusive of separately billable procedures and treating other patients and/or supervising APPs/Residents/Students Critical  care was necessary to treat or prevent imminent or life-threatening deterioration. This patient is critically ill and at significant risk for neurological worsening and/or death and care requires constant monitoring. Critical care was time spent personally by me on the following activities: development of treatment plan with patient and/or surrogate as well as nursing, discussions with consultants, evaluation of patient's response to treatment, examination of patient, obtaining history from patient or surrogate, ordering and performing treatments and interventions, ordering and review of laboratory studies, ordering and review of radiographic studies, pulse oximetry, re-evaluation of patient's condition, participation in multidisciplinary rounds and medical decision making of high complexity in the care of this patient.

## 2024-01-21 NOTE — ED Provider Notes (Signed)
 [place] EMERGENCY DEPARTMENT AT [facility] Provider Note   CSN: [id number] Arrival date & time: 01/21/24  9279  An emergency department physician performed an initial assessment on this suspected stroke patient at 73.  Patient presents with: Code Stroke   Megan Fischer is a 73 y.o. female with past medical history significant for Afib, obesity, HTN, HLD, diabetes, CHF who presents with left sided neglect, fall, left sided weakness, left sided visual deficits. LNW around 8pm last night prior to bed. Woke up and had mechanical fall due to balance issues. No previous stroke.    HPI     Prior to Admission medications   Medication Sig Start Date End Date Taking? Authorizing Provider  apixaban  (ELIQUIS ) 5 MG TABS tablet Take 1 tablet (5 mg total) by mouth 2 (two) times daily. 11/19/23   Ladona Heinz, MD  benazepril  (LOTENSIN ) 20 MG tablet Take 1 tablet (20 mg total) by mouth daily. 04/27/23 04/26/24  Jolaine Pac, DO  Blood Glucose Monitoring Suppl (ONETOUCH VERIO IQ SYSTEM) W/DEVICE KIT Use to check blood sugar 2 times per day dx code 250.02 09/12/13   Von Pacific, MD  Blood Pressure Monitoring (ADULT BLOOD PRESSURE CUFF LG) KIT HTN 11/06/23   Bernadine Manos, MD  CALCIUM  PO Take by mouth 2 (two) times daily.    [provider]  diclofenac  Sodium (VOLTAREN  ARTHRITIS PAIN) 1 % GEL Apply 2 g topically 4 (four) times daily. 03/22/23   Stephanie Freund, MD  diltiazem  (CARDIZEM  CD) 360 MG 24 hr capsule TAKE 1 CAPSULE BY MOUTH DAILY 11/06/22   Von Pacific, MD  empagliflozin  (JARDIANCE ) 25 MG TABS tablet Take 1 tablet (25 mg total) by mouth daily before breakfast. 10/18/23   Thapa, [place], MD  ergocalciferol (VITAMIN D2) 50000 UNITS capsule Take 50,000 Units by mouth once a week.    [provider]  ezetimibe  (ZETIA ) 10 MG tablet Take 1 tablet (10 mg total) by mouth daily. 12/18/23 03/17/24  Ladona Heinz, MD  ferrous sulfate  325 (65 FE) MG EC tablet Take 1 tablet (325 mg  total) by mouth 2 (two) times daily. 09/27/14   Lanny Callander, MD  furosemide  (LASIX ) 40 MG tablet Take 1 tablet (40 mg total) by mouth daily. 04/12/21   Will Almarie MATSU, MD  glimepiride  (AMARYL ) 1 MG tablet Take 1 tablet (1 mg total) by mouth daily with breakfast. 10/18/23   Thapa, [place], MD  methimazole  (TAPAZOLE ) 5 MG tablet Take 1 tablet (5 mg total) by mouth daily. 06/22/23 06/21/24  Thapa, [place], MD  methocarbamol  (ROBAXIN ) 500 MG tablet TAKE 2 TABLETS BY MOUTH EVERY 6  HOURS AS NEEDED FOR MUSCLE  SPASMS 11/20/23   Azadegan, Maryam, MD  metoprolol  succinate (TOPROL -XL) 50 MG 24 hr tablet TAKE 1 TABLET BY MOUTH DAILY  WITH OR IMMEDIATELY FOLLOWING A  MEAL 09/20/23   Trudy Mliss Dragon, MD  mupirocin  ointment (BACTROBAN ) 2 % Apply 1 Application topically 2 (two) times daily. Apply to finger wound twice daily and cover with dry bandage 09/21/23   Jule Ronal CROME, PA-C  ONETOUCH VERIO test strip USE AS DIRECTED TO CHECK  BLOOD SUGAR TWICE DAILY 06/02/22   Von Pacific, MD  rosuvastatin  (CRESTOR ) 10 MG tablet Take 1 tablet (10 mg total) by mouth daily. 10/18/23   Thapa, [place], MD  Semaglutide , 2 MG/DOSE, (OZEMPIC , 2 MG/DOSE,) 8 MG/3ML SOPN INJECT SUBCUTANEOUSLY 2 MG EVERY WEEK 06/22/23   Thapa, [place], MD    Allergies: Aspirin , Codeine, Fentanyl , Oxycodone hcl, Penicillins, and Chlorhexidine  gluconate    Review of Systems  All other systems reviewed and are negative.   Updated Vital Signs BP (!) 158/141   Pulse 74   Temp 98.2 F (36.8 C)   Resp 15   Wt 87 kg   SpO2 99%   BMI 33.98 kg/m   Physical Exam Vitals and nursing note reviewed.  Constitutional:      General: She is not in acute distress.    Appearance: Normal appearance.  HENT:     Head: Normocephalic and atraumatic.  Eyes:     General:        Right eye: No discharge.        Left eye: No discharge.  Cardiovascular:     Rate and Rhythm: Normal rate and regular rhythm.     Heart sounds: No murmur heard.    No friction rub. No  gallop.  Pulmonary:     Effort: Pulmonary effort is normal.     Breath sounds: Normal breath sounds.  Abdominal:     General: Bowel sounds are normal.     Palpations: Abdomen is soft.  Skin:    General: Skin is warm and dry.     Capillary Refill: Capillary refill takes less than 2 seconds.  Neurological:     Mental Status: She is alert and oriented to person, place, and time.     Comments: Decreased sensation reported by patient on right side of face. Numbness of left side of arms and legs. Facial muscles are symmetric, strength intact. Right sided gaze deviation. Fairly profound left sided neglect. Strength overall intact bilateral upper and lower extremities but difficulty moving left side on her own.   Psychiatric:        Mood and Affect: Mood normal.        Behavior: Behavior normal.     (all labs ordered are listed, but only abnormal results are displayed) Labs Reviewed  COMPREHENSIVE METABOLIC PANEL WITH GFR - Abnormal; Notable for the following components:      Result Value   Glucose, Bld 136 (*)    BUN 25 (*)    Creatinine, Ser 1.27 (*)    GFR, Estimated 45 (*)    All other components within normal limits  CBG MONITORING, ED - Abnormal; Notable for the following components:   Glucose-Capillary 193 (*)    All other components within normal limits  I-STAT CHEM 8, ED - Abnormal; Notable for the following components:   BUN 30 (*)    Creatinine, Ser 1.40 (*)    Glucose, Bld 190 (*)    All other components within normal limits  ETHANOL  PROTIME-INR  APTT  CBC  DIFFERENTIAL  RAPID URINE DRUG SCREEN, HOSP PERFORMED    EKG: None  Radiology: CT C-SPINE NO CHARGE Result Date: 01/21/2024 CLINICAL DATA:  Left-sided weakness.  Fall when getting out of bed. EXAM: CT CERVICAL SPINE WITHOUT CONTRAST TECHNIQUE: Multidetector CT imaging of the cervical spine was performed without intravenous contrast. Multiplanar CT image reconstructions were also generated. RADIATION DOSE  REDUCTION: This exam was performed according to the departmental dose-optimization program which includes automated exposure control, adjustment of the mA and/or kV according to patient size and/or use of iterative reconstruction technique. COMPARISON:  None Available. FINDINGS: Alignment: Reversal of the normal cervical lordosis. Grade 1 anterolisthesis of C3 on C4, C4 on C5, and C5 on C6. Skull base and vertebrae: No acute fracture or destructive process. Vertebral body and facet ankylosis at C5-6. Soft tissues and spinal  canal: The soft tissues are more fully evaluated on the contemporaneous CTA of the head and neck. Disc levels: Advanced disc space narrowing and degenerative endplate changes at C6-7. Asymmetrically advanced facet arthrosis on the left at C2-3 and C3-4 and on the right at C4-5. Mild to moderate right neural foraminal stenosis at C4-5 and C5-6. No evidence of high-grade spinal canal stenosis. Upper chest: No mass or consolidation in the included lung apices. Other: None. IMPRESSION: 1. No acute cervical spine fracture. 2. Multilevel disc and facet degeneration. Electronically Signed   By: Dasie Hamburg M.D.   On: 01/21/2024 08:28   CT ANGIO HEAD NECK W WO CM W PERF (CODE STROKE) Result Date: 01/21/2024 CLINICAL DATA:  Neuro deficit, acute, stroke suspected. EXAM: CT ANGIOGRAPHY HEAD AND NECK CT PERFUSION BRAIN TECHNIQUE: Multidetector CT imaging of the head and neck was performed using the standard protocol during bolus administration of intravenous contrast. Multiplanar CT image reconstructions and MIPs were obtained to evaluate the vascular anatomy. Carotid stenosis measurements (when applicable) are obtained utilizing NASCET criteria, using the distal internal carotid diameter as the denominator. Multiphase CT imaging of the brain was performed following IV bolus contrast injection. Subsequent parametric perfusion maps were calculated using RAPID software. RADIATION DOSE REDUCTION: This exam  was performed according to the departmental dose-optimization program which includes automated exposure control, adjustment of the mA and/or kV according to patient size and/or use of iterative reconstruction technique. CONTRAST:  OMNIPAQUE  IOHEXOL  350 MG/ML SOLN COMPARISON:  None Available. FINDINGS: CTA NECK FINDINGS Aortic arch: Normal variant aortic arch branching pattern with common origin of the brachiocephalic and left common carotid arteries. Calcified atherosclerosis without significant stenosis of the arch vessel origins. Right carotid system: The common carotid artery is widely patent, however there is mixed calcified and soft plaque at the carotid bifurcation with occlusion of the ICA at its origin and without reconstitution in the neck. Left carotid system: Patent without evidence of stenosis or dissection. Partially retropharyngeal course of the ICA. Vertebral arteries: Patent without evidence of stenosis or dissection. Mildly dominant right vertebral artery. Skeleton: Cervical spine reported separately. Other neck: Diffusely enlarged and heterogeneous thyroid  containing multiple nodules including a 4 cm nodule on the right, previously evaluated by ultrasound. No evidence of cervical lymphadenopathy. Upper chest: No mass or consolidation in the included lung apices. Review of the MIP images confirms the above findings CTA HEAD FINDINGS Anterior circulation: The intracranial left ICA is patent with mild atherosclerotic calcification not resulting in a significant stenosis. There is reconstitution of the right ICA beginning in the cavernous segment. There is mild diffuse attenuation of the right MCA compared to the left without evidence of a significant M1 stenosis or proximal branch occlusion. The ACAs and left MCA are patent without evidence of a significant proximal stenosis. No aneurysm is identified. Posterior circulation: The intracranial vertebral arteries are widely patent to the basilar.  Patent right PICA and bilateral SCA origins are visualized. AICAs and a left PICA are not clearly identified. The basilar artery is widely patent. There are small right and diminutive or absent left posterior communicating arteries. There is occlusion of the proximal right P2 segment without significant distal collateralization. The left PCA is patent without evidence of a significant proximal stenosis. No aneurysm is identified. Venous sinuses: As permitted by contrast timing, patent. Anatomic variants: None. Review of the MIP images confirms the above findings CT Brain Perfusion Findings: ASPECTS: 10 CBF (<30%) Volume: 9 mL Perfusion (Tmax>6.0s) volume: 42 mL Mismatch  Volume: 33 mL Infarction Location:Right PCA territory These results were communicated to Dr. Voncile at 7:54 am on 01/21/2024 by text page via the [facility] messaging system. IMPRESSION: 1. 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 2. Proximal right P2 occlusion. 3. 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 4.  Aortic Atherosclerosis (ICD10-I70.0). Electronically Signed   By: Dasie Hamburg M.D.   On: 01/21/2024 08:09   CT HEAD CODE STROKE WO CONTRAST Result Date: 01/21/2024 CLINICAL DATA:  Code stroke. Neuro deficit, acute, stroke suspected. EXAM: CT HEAD WITHOUT CONTRAST TECHNIQUE: Contiguous axial images were obtained from the base of the skull through the vertex without intravenous contrast. RADIATION DOSE REDUCTION: This exam was performed according to the departmental dose-optimization program which includes automated exposure control, adjustment of the mA and/or kV according to patient size and/or use of iterative reconstruction technique. COMPARISON:  None Available. FINDINGS: Brain: Confluent hypodensity involving cortex and white matter in the medial right occipital lobe extending into the posterior temporal lobe is consistent with an acute to subacute right PCA infarct. There is also involvement of the right  thalamus. No intracranial hemorrhage, midline shift, hydrocephalus, or extra-axial fluid collection is evident. A partially empty sella is noted. Vascular: Calcified atherosclerosis at the skull base. No hyperdense vessel. Skull: No acute fracture or suspicious lesion. Sinuses/Orbits: Visualized paranasal sinuses and mastoid air cells are clear. Bilateral cataract extraction. Other: None. ASPECTS ([place]berta Stroke Program Early CT Score) - Ganglionic level infarction (caudate, lentiform nuclei, internal capsule, insula, M1-M3 cortex): 7 - Supraganglionic infarction (M4-M6 cortex): 3 Total score (0-10 with 10 being normal): 10 These results were communicated to Dr. Arora at 7:54 am on 01/21/2024 by text page via the [facility] messaging system. IMPRESSION: Moderately large acute to subacute right PCA infarct. No intracranial hemorrhage. Electronically Signed   By: Dasie Hamburg M.D.   On: 01/21/2024 07:55     Procedures   Medications Ordered in the ED  iohexol  (OMNIPAQUE ) 350 MG/ML injection 100 mL (100 mLs Intravenous Contrast Given 01/21/24 0751)    Clinical Course as of 01/21/24 1021  Mon Jan 21, 2024  0804 Hold eliquis , keep on ASA for now -- neuro will advise [CP]    Clinical Course User Index [CP] Rosan Sherlean DEL, PA-C                                 Medical Decision Making Amount and/or Complexity of Data Reviewed Labs: ordered. Radiology: ordered.  This patient is a 73 y.o. female  who presents to the ED for concern of fall, right sided gaze preference, decreased balance, numbness.   Differential diagnoses prior to evaluation: The emergent differential diagnosis includes, but is not limited to,  CVA, spinal cord injury, ACS, arrhythmia, syncope, orthostatic hypotension, sepsis, hypoglycemia, hypoxia, electrolyte disturbance, endocrine disorder, anemia, environmental exposure, polypharmacy, epidural hematoma, subdural hematoma, skull fracture, subarachnoid hemorrhage, unstable cervical  spine fracture, concussion vs other MSK injury  . This is not an exhaustive differential.   Past Medical History / Co-morbidities / Social History:  Afib, obesity, HTN, HLD, diabetes, CHF  Additional history: Chart reviewed. Pertinent results include: Reviewed outpatient cardiology, family medicine visits, remote imaging, notably no previous CT head  Physical Exam: Physical exam performed. The pertinent findings include: Decreased sensation reported by patient on right side of face. Numbness of left side of arms and legs. Facial muscles are symmetric, strength intact. Right sided  gaze deviation. Fairly profound left sided neglect. Strength overall intact bilateral upper and lower extremities but difficulty moving left side on her own.    Hypertension, BP 186/110. VSS otherwise overall stable.   Lab Tests/Imaging studies: I personally interpreted labs/imaging and the pertinent results include: CBC unremarkable, CMP with mildly elevated BUN, 25, creatinine 1.27.  I independently interpreted CT head, C-spine, CT angio head and neck which shows P2 occlusion on right, right PCA infarct. no ICH, epidural, or subdural hematoma, no unstable cervical spine injury. I agree with the radiologist interpretation.  Cardiac monitoring: EKG obtained and interpreted by myself and attending physician which shows: afib, normal rate, no other acute changes noted   Consults: Spoke with neurologist, Dr. Voncile who reports no role for thrombectomy or tPA at this time due to the progression of the stroke, high risk for bleeding, will plan for medicine admission, permissive hypertension, currently plans on holding Eliquis  but recommend staying on aspirin .  I spoke with the internal medicine teaching service, Dr. Jolaine, who agrees to admission for new right PCA infarct.   Disposition: After consideration of the diagnostic results and the patients response to treatment, I feel that patient would benefit from admission  as discussed above.   Final diagnoses:  None    ED Discharge Orders     None          Rosan Sherlean DEL, PA-C 01/21/24 1021    Neysa Caron PARAS, [place] 01/21/24 1514

## 2024-01-21 NOTE — ED Notes (Signed)
 Pt otf to mri.

## 2024-01-21 NOTE — ED Notes (Signed)
 Pt family reports that pt appear more confused that usual.

## 2024-01-21 NOTE — Progress Notes (Signed)
  Echocardiogram 2D Echocardiogram has been performed.  Megan Fischer 01/21/2024, 3:47 PM

## 2024-01-21 NOTE — Hospital Course (Addendum)
 #  Acute right PCA stroke  - Patient presented to the ED for left side weakness and right gaze preference. She was found on CT and MRI to have right PCA stroke and her right ICA was occluded. She was outside of the window for TPA, and the location of the clot was unsafe for thrombectomy.  Echo was clear for PFO and clot. Her A1c was 6.5 and lipid profile was remarkable for cholesterol of 232 and LDL was 123 for which we increased rosuvastatin  to 20 mg. Her eliquis  was held for 72 hours and her blood pressure medications were held for 48 hours for permissive hypertension. Neurology thought her stroke was due to both A-fib and large vessel disease. Patient also continued to work with speech.  PT and OT recommended CIR for patient   #Hyperlipidemia  - Her cholesterol was 232 and her LDL was 123 this admission. She was on Zetia  10 mg and rosuvastatin  10 mg for management, and now increased to rosuvastatin  20 mg due to high LDL. Goal is less than 70    #Atrial fibrillation   - Likely contributed to her stroke. Echo done was clear for thrombus in the left atrial appendage. She was on telemetry for monitoring.  She was on eliquis  on admissions, which we held for 72 hours. We kept patient on plavix , and then discontinued for eliquis  and aspirin  81 mg. We also held her metoprolol  to allow for permissive hypertension which we restarted at 48 hours.  #Hypertension  - We held her blood pressure medications to allow for permissive hypertension. We continued Metoprolol  and benazepril  at 48 hours. Also added amlodipine  10 mg for further blood pressure control   #Type 2 diabetes  - A1c this admission was 6.5, which shows adequate control.Goal of less than 7.  We provided SSI 3 times daily with meals and at night.   #CKD stage IIIb  - Her creatinine was 1.40 on admission. Creatinine at time of discharge 1.45   #Hypothyroidism  - TSH this admission was 1.627. We continued methimazole  at 5 mg daily.

## 2024-01-21 NOTE — H&P (Addendum)
 Date: 01/21/2024               Patient Name:  [name] MRN: [id number]  DOB: [birth year] Age / Sex: 73 y.o., female   PCP: Bernadine Manos, MD         Medical Service: Internal Medicine Teaching Service         Attending Physician: Dr. Eben Reyes BROCKS, MD      First Contact: Dr. Remonia Romano, DO    Second Contact: Dr. Ozell Kung, MD         After Hours (After 5p/  First Contact Pager: [phone]  weekends / holidays): Second Contact Pager: [phone]   SUBJECTIVE   Chief Complaint: Fall  History of Present Illness:  Megan Fischer is a 73 year old female with a past medical history of A-fib on Eliquis , hypertension, hyperthyroidism, and type 2 diabetes who presented with left-sided weakness and right gaze preference after a fall this morning.  She went to bed at about 8 PM last night and was feeling at her baseline but when she woke up this morning she had left-sided weakness and when she got up to walk fell down.  She denied any prodrome, syncope, or other indicators that this was not a mechanical fall.  She has had no recent changes in medicines, run out of medicines, or changes in her lifestyle.  She does still have some weakness in her left side but denies any right-sided issues, pain, shortness of breath, nausea, or dysuria.  ED Course: Presented as above hypertensive but otherwise hemodynamically stable and was activated as a code stroke.  CT and CTA of the head and neck showed acute right P2 occlusion with 33 cc of penumbra that was thought to be established due to its appearance on CT of the head without.  Case was discussed with neurointerventional team but due to the large size of established infarct and distal location of the vessel the decision was made to defer thrombectomy and she was outside the window for IV thrombolysis.  [facility] was paged for admission  Past Medical History Atrial fibrillation Hypertension Type 2  diabetes Hyperlipidemia Osteoarthritis  Meds:  Apixaban  5 mg twice daily Benazepril  20 mg daily Duloxetine  60 mg daily Empagliflozin  25 mg daily Ezetimibe  10 mg daily Glimepiride  1 mg daily Metformin  500 mg twice daily Methimazole  5 mg daily Methocarbamol  500 mg 3 times daily Metoprolol  succinate 50 mg daily Rosuvastatin  10 mg daily Semaglutide  2 mg weekly  Past Surgical History Past Surgical History:  Procedure Laterality Date   CHOLECYSTECTOMY     REPLACEMENT TOTAL KNEE BILATERAL     SHOULDER SURGERY     RIGHT SHOULDER   TUBAL LIGATION      Social:  Lives at home alone.  Uses a walker for ambulation.  Her son is nearby and supportive.  Independent in ADLs. PCP: Azadegan, Maryam, MD Substances: Denies tobacco, alcohol, drug use.  Family History:  Family History  Problem Relation Age of Onset   Heart attack Mother    Stroke Mother    Diabetes Mother    Hypertension Brother    Breast cancer Neg Hx     Allergies: Allergies as of 01/21/2024 - Reviewed 01/21/2024  Allergen Reaction Noted   Aspirin   11/16/2020   Codeine  08/25/2010   Fentanyl  Other (See Comments) 11/12/2020   Oxycodone hcl  08/25/2010   Penicillins  08/25/2010   Chlorhexidine gluconate Rash 06/04/2015    Review of Systems: A complete ROS was negative except  as per HPI.   OBJECTIVE:   Physical Exam: Blood pressure (!) 158/141, pulse 74, temperature 98.2 F (36.8 C), resp. rate 15, weight 87 kg, SpO2 99%.  Constitutional: Slightly tired appearing elderly female laying in bed. In no acute distress. Cardio: Irregularly irregular rhythm with regular rate. 2+ bilateral radial and dorsalis pedis  pulses. Pulm:Clear to auscultation bilaterally in the anterior fields. Normal work of breathing on room air. Abdomen: Soft, non-tender, non-distended, positive bowel sounds. MSK: Trace lower extremity edema bilaterally Skin:Warm and dry.  Neuro: *MS: A&O x4. Follows multi-step commands.  *Speech: no  dysarthria or aphasia, able to name and repeat. *CN:    I: Deferred   II,III: PERRLA, left-sided homonymous hemianopsia, right-sided gaze preference, optic discs not visualized 2/2 pupillary constriction   III,IV,VI: EOMI w/o nystagmus, no ptosis   V: Sensation intact from V1 to V3 to LT   VII: Eyelid closure was full.  Smile symmetric.   VIII: Hearing intact to voice   IX,X: Voice normal, palate elevates symmetrically    XI: SCM/trap 5/5 bilat   XII: Tongue protrudes midline, no atrophy or fasciculations  *Motor:   Normal bulk.  No tremor, rigidity or bradykinesia. No pronator drift.   Strength: Dlt Bic Tri WE WrF FgS Gr HF KnF KnE PlF DoF    Left 3 4 4 4 4  4- 4- 4- 4- 4- 4 4    Right 5 5 5 5 5 5 5 5 5 5 5 5    *Sensory: Intact to light touch, pinprick, temperature vibration throughout the right side and left upper extremity.  Absent/severely diminished sensation to the left lower extremity.  Double simultaneous extinction present on left upper and lower extremity *Reflexes:  2+ and symmetric throughout bilateral upper extremities *Gait: deferred  Labs: CBC    Component Value Date/Time   WBC 8.2 01/21/2024 0902   RBC 4.58 01/21/2024 0902   HGB 13.2 01/21/2024 0902   HGB 13.3 10/10/2023 1526   HGB 12.0 02/16/2017 0802   HCT 41.9 01/21/2024 0902   HCT 42.2 10/10/2023 1526   HCT 36.8 02/16/2017 0802   PLT 224 01/21/2024 0902   PLT 253 10/10/2023 1526   MCV 91.5 01/21/2024 0902   MCV 94 10/10/2023 1526   MCV 89.3 02/16/2017 0802   MCH 28.8 01/21/2024 0902   MCHC 31.5 01/21/2024 0902   RDW 15.0 01/21/2024 0902   RDW 14.3 10/10/2023 1526   RDW 15.6 (H) 02/16/2017 0802   LYMPHSABS 0.9 01/21/2024 0902   LYMPHSABS 1.9 02/16/2017 0802   MONOABS 0.5 01/21/2024 0902   MONOABS 0.2 02/16/2017 0802   EOSABS 0.0 01/21/2024 0902   EOSABS 0.1 02/16/2017 0802   EOSABS 0.2 02/05/2006 0932   BASOSABS 0.0 01/21/2024 0902   BASOSABS 0.0 02/16/2017 0802     CMP     Component Value  Date/Time   NA 141 01/21/2024 0902   NA 142 10/10/2023 1526   NA 143 02/16/2017 0802   K 4.0 01/21/2024 0902   K 3.5 02/16/2017 0802   CL 102 01/21/2024 0902   CO2 26 01/21/2024 0902   CO2 26 02/16/2017 0802   GLUCOSE 136 (H) 01/21/2024 0902   GLUCOSE 92 02/16/2017 0802   BUN 25 (H) 01/21/2024 0902   BUN 33 (H) 10/10/2023 1526   BUN 17.2 02/16/2017 0802   CREATININE 1.27 (H) 01/21/2024 0902   CREATININE 1.33 (H) 10/12/2023 0859   CREATININE 0.9 02/16/2017 0802   CALCIUM  9.7 01/21/2024 0902   CALCIUM  9.9  02/16/2017 0802   PROT 8.0 01/21/2024 0902   PROT 7.5 02/16/2017 0802   ALBUMIN 3.7 01/21/2024 0902   ALBUMIN 3.9 02/16/2017 0802   AST 31 01/21/2024 0902   AST 25 08/08/2023 0813   AST 26 02/16/2017 0802   ALT 21 01/21/2024 0902   ALT 18 08/08/2023 0813   ALT 18 02/16/2017 0802   ALKPHOS 95 01/21/2024 0902   ALKPHOS 64 02/16/2017 0802   BILITOT 0.8 01/21/2024 0902   BILITOT 0.5 08/08/2023 0813   BILITOT 0.58 02/16/2017 0802   GFRNONAA 45 (L) 01/21/2024 0902   GFRNONAA 40 (L) 08/08/2023 0813   GFRAA >60 05/17/2018 0807    Imaging: CT C-SPINE NO CHARGE Result Date: 01/21/2024 CLINICAL DATA:  Left-sided weakness.  Fall when getting out of bed. EXAM: CT CERVICAL SPINE WITHOUT CONTRAST TECHNIQUE: Multidetector CT imaging of the cervical spine was performed without intravenous contrast. Multiplanar CT image reconstructions were also generated. RADIATION DOSE REDUCTION: This exam was performed according to the departmental dose-optimization program which includes automated exposure control, adjustment of the mA and/or kV according to patient size and/or use of iterative reconstruction technique. COMPARISON:  None Available. FINDINGS: Alignment: Reversal of the normal cervical lordosis. Grade 1 anterolisthesis of C3 on C4, C4 on C5, and C5 on C6. Skull base and vertebrae: No acute fracture or destructive process. Vertebral body and facet ankylosis at C5-6. Soft tissues and spinal  canal: The soft tissues are more fully evaluated on the contemporaneous CTA of the head and neck. Disc levels: Advanced disc space narrowing and degenerative endplate changes at C6-7. Asymmetrically advanced facet arthrosis on the left at C2-3 and C3-4 and on the right at C4-5. Mild to moderate right neural foraminal stenosis at C4-5 and C5-6. No evidence of high-grade spinal canal stenosis. Upper chest: No mass or consolidation in the included lung apices. Other: None. IMPRESSION: 1. No acute cervical spine fracture. 2. Multilevel disc and facet degeneration. Electronically Signed   By: Dasie Hamburg M.D.   On: 01/21/2024 08:28   CT ANGIO HEAD NECK W WO CM W PERF (CODE STROKE) Result Date: 01/21/2024 CLINICAL DATA:  Neuro deficit, acute, stroke suspected. EXAM: CT ANGIOGRAPHY HEAD AND NECK CT PERFUSION BRAIN TECHNIQUE: Multidetector CT imaging of the head and neck was performed using the standard protocol during bolus administration of intravenous contrast. Multiplanar CT image reconstructions and MIPs were obtained to evaluate the vascular anatomy. Carotid stenosis measurements (when applicable) are obtained utilizing NASCET criteria, using the distal internal carotid diameter as the denominator. Multiphase CT imaging of the brain was performed following IV bolus contrast injection. Subsequent parametric perfusion maps were calculated using RAPID software. RADIATION DOSE REDUCTION: This exam was performed according to the departmental dose-optimization program which includes automated exposure control, adjustment of the mA and/or kV according to patient size and/or use of iterative reconstruction technique. CONTRAST:  100mL OMNIPAQUE  IOHEXOL  350 MG/ML SOLN COMPARISON:  None Available. FINDINGS: CTA NECK FINDINGS Aortic arch: Normal variant aortic arch branching pattern with common origin of the brachiocephalic and left common carotid arteries. Calcified atherosclerosis without significant stenosis of the arch  vessel origins. Right carotid system: The common carotid artery is widely patent, however there is mixed calcified and soft plaque at the carotid bifurcation with occlusion of the ICA at its origin and without reconstitution in the neck. Left carotid system: Patent without evidence of stenosis or dissection. Partially retropharyngeal course of the ICA. Vertebral arteries: Patent without evidence of stenosis or dissection. Mildly dominant right vertebral  artery. Skeleton: Cervical spine reported separately. Other neck: Diffusely enlarged and heterogeneous thyroid  containing multiple nodules including a 4 cm nodule on the right, previously evaluated by ultrasound. No evidence of cervical lymphadenopathy. Upper chest: No mass or consolidation in the included lung apices. Review of the MIP images confirms the above findings CTA HEAD FINDINGS Anterior circulation: The intracranial left ICA is patent with mild atherosclerotic calcification not resulting in a significant stenosis. There is reconstitution of the right ICA beginning in the cavernous segment. There is mild diffuse attenuation of the right MCA compared to the left without evidence of a significant M1 stenosis or proximal branch occlusion. The ACAs and left MCA are patent without evidence of a significant proximal stenosis. No aneurysm is identified. Posterior circulation: The intracranial vertebral arteries are widely patent to the basilar. Patent right PICA and bilateral SCA origins are visualized. AICAs and a left PICA are not clearly identified. The basilar artery is widely patent. There are small right and diminutive or absent left posterior communicating arteries. There is occlusion of the proximal right P2 segment without significant distal collateralization. The left PCA is patent without evidence of a significant proximal stenosis. No aneurysm is identified. Venous sinuses: As permitted by contrast timing, patent. Anatomic variants: None. Review of the  MIP images confirms the above findings CT Brain Perfusion Findings: ASPECTS: 10 CBF (<30%) Volume: 9 mL Perfusion (Tmax>6.0s) volume: 42 mL Mismatch Volume: 33 mL Infarction Location:Right PCA territory These results were communicated to Dr. Voncile at 7:54 am on 01/21/2024 by text page via the [facility] messaging system. IMPRESSION: 1. 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 2. Proximal right P2 occlusion. 3. 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 4.  Aortic Atherosclerosis (ICD10-I70.0). Electronically Signed   By: Dasie Hamburg M.D.   On: 01/21/2024 08:09   CT HEAD CODE STROKE WO CONTRAST Result Date: 01/21/2024 CLINICAL DATA:  Code stroke. Neuro deficit, acute, stroke suspected. EXAM: CT HEAD WITHOUT CONTRAST TECHNIQUE: Contiguous axial images were obtained from the base of the skull through the vertex without intravenous contrast. RADIATION DOSE REDUCTION: This exam was performed according to the departmental dose-optimization program which includes automated exposure control, adjustment of the mA and/or kV according to patient size and/or use of iterative reconstruction technique. COMPARISON:  None Available. FINDINGS: Brain: Confluent hypodensity involving cortex and white matter in the medial right occipital lobe extending into the posterior temporal lobe is consistent with an acute to subacute right PCA infarct. There is also involvement of the right thalamus. No intracranial hemorrhage, midline shift, hydrocephalus, or extra-axial fluid collection is evident. A partially empty sella is noted. Vascular: Calcified atherosclerosis at the skull base. No hyperdense vessel. Skull: No acute fracture or suspicious lesion. Sinuses/Orbits: Visualized paranasal sinuses and mastoid air cells are clear. Bilateral cataract extraction. Other: None. ASPECTS ([place] Stroke Program Early CT Score) - Ganglionic level infarction (caudate, lentiform nuclei, internal capsule, insula, M1-M3  cortex): 7 - Supraganglionic infarction (M4-M6 cortex): 3 Total score (0-10 with 10 being normal): 10 These results were communicated to Dr. Arora at 7:54 am on 01/21/2024 by text page via the [facility] messaging system. IMPRESSION: Moderately large acute to subacute right PCA infarct. No intracranial hemorrhage. Electronically Signed   By: Dasie Hamburg M.D.   On: 01/21/2024 07:55     EKG: personally reviewed my interpretation is atrial fibrillation. Consistent with prior EKG.  ASSESSMENT & PLAN:   Assessment & Plan by Problem: Principal Problem:   Acute  cerebrovascular accident (CVA) due to occlusion of left posterior cerebral artery (HCC) Active Problems:   Atrial fibrillation (HCC)   Hypertension   DM (diabetes mellitus) type II controlled with renal manifestation (HCC)   Multinodular goiter   Stroke [facility])   Megan Fischer is a 73 y.o. female with pertinent PMH of A-fib on Eliquis , hypertension, hyperthyroidism, CKD stage IIIb and type 2 diabetes who presented with left-sided deficits and is admitted for acute CVA 2/2 left PCA occlusion.  Acute CVA 2/2 left PCA occlusion Patient has continued right gaze preference and significant left upper and lower extremity weakness.  Out of the window for tPA and deemed to not be a good candidate for thrombectomy.  Admitted for medical management with neurology following.  Most likely cardioembolic due to chronic atrial fibrillation although she is prescribed Eliquis  and reports taking this at home.  Neurology recommends holding anticoagulation due to large penumbra and risk of hemorrhagic conversion. - Admit to med/tele - Cardiac telemetry - MRI brain without contrast - TTE with bubble - A1c and lipid panel in the morning - Hold Eliquis  and start clopidogrel  75 mg daily (aspirin  allergy) - PT/OT/SLP, n.p.o. until passes bedside swallow - Hold antihypertensives for permissive hypertension for the next 24-48 hours, please page for systolic blood pressure  greater than 220  Hypertension Home medications include benazepril  20 mg daily and metoprolol  succinate 50 mg daily.  We will hold these medications for now as above for permissive hypertension.  Atrial fibrillation Currently in atrial fibrillation and on Eliquis  and metoprolol  at home.  As above we will hold his medications due to the risk of hemorrhagic conversion and for permissive hypertension.  If she is to develop RVR we will add back short acting metoprolol  first.  Type 2 diabetes Last A1c 7.8 on 10/12/2023.  She has remained well-controlled over the last few years.  She is on glimepiride , metformin , empagliflozin , and semaglutide .  No sign of an AKI here but due to the potential of lowering her blood pressure we will hold off on SGLT2i and due to the risk of contrast-induced nephropathy in combination with metformin  we will hold off on metformin . - Moderate SSI 3 times daily with meals and nightly while admitted  CKD stage IIIb Creatinine 1.27 with a GFR 45 here which appears better than baseline with most recently her GFR being in the mid 30s.  Hyperthyroidism -Continue home methimazole  5 mg daily  Diet: NPO pending bedside swallow, then carb modified diet VTE: SCDs Code: DNR/DNI  Dispo: Admit patient to Inpatient with expected length of stay greater than 2 midnights.  Signed: Fairy Pool, DO Internal Medicine Resident, PGY-3 Please contact the on call pager at [phone] for any urgent or emergent needs. 11:16 AM 01/21/2024

## 2024-01-21 NOTE — Code Documentation (Signed)
 Stroke Response Nurse Documentation Code Documentation  ZILPHA MCANDREW is a 73 y.o. female arriving to [facility]  via [facility] on 01/21/2024 with past medical hx of AF, DM, obesity, HTN, CHF. On Eliquis  (apixaban ) daily. Code stroke was activated by EMS.   Patient from home where she was LKW at 2000 last night when she went to bed and now complaining of left weakness, and sensory loss, rt gaze.   Stroke team at the bedside on patient arrival. Labs drawn and patient cleared for CT by Dr. Rosan. Patient to CT with team. NIHSS 15, see documentation for details and code stroke times. Patient with right gaze preference , left hemianopia, left facial droop, left arm weakness, left leg weakness, left decreased sensation, and Sensory  neglect on exam. The following imaging was completed:  CT Head, CTA, and CTP. Patient is not a candidate for IV Thrombolytic due to Eliquis  use. Patient is not a candidate for IR due to completed stroke per Dr. Arora..   Care Plan: NIHSS and VS q 2 for 12 hours, then q 4. NPO until passes stroke swallow screen..     Bedside handoff with ED RN Dozier.    Daequan Kozma Livengood  Stroke Response RN

## 2024-01-22 ENCOUNTER — Encounter (HOSPITAL_COMMUNITY): Payer: Self-pay | Admitting: Infectious Diseases

## 2024-01-22 DIAGNOSIS — I779 Disorder of arteries and arterioles, unspecified: Secondary | ICD-10-CM | POA: Diagnosis not present

## 2024-01-22 DIAGNOSIS — R29711 NIHSS score 11: Secondary | ICD-10-CM

## 2024-01-22 DIAGNOSIS — N1832 Chronic kidney disease, stage 3b: Secondary | ICD-10-CM | POA: Diagnosis not present

## 2024-01-22 DIAGNOSIS — I4891 Unspecified atrial fibrillation: Secondary | ICD-10-CM | POA: Diagnosis not present

## 2024-01-22 DIAGNOSIS — I63431 Cerebral infarction due to embolism of right posterior cerebral artery: Secondary | ICD-10-CM | POA: Diagnosis not present

## 2024-01-22 DIAGNOSIS — I69391 Dysphagia following cerebral infarction: Secondary | ICD-10-CM

## 2024-01-22 DIAGNOSIS — Z7902 Long term (current) use of antithrombotics/antiplatelets: Secondary | ICD-10-CM

## 2024-01-22 DIAGNOSIS — I63531 Cerebral infarction due to unspecified occlusion or stenosis of right posterior cerebral artery: Secondary | ICD-10-CM | POA: Diagnosis not present

## 2024-01-22 DIAGNOSIS — E1151 Type 2 diabetes mellitus with diabetic peripheral angiopathy without gangrene: Secondary | ICD-10-CM

## 2024-01-22 DIAGNOSIS — I129 Hypertensive chronic kidney disease with stage 1 through stage 4 chronic kidney disease, or unspecified chronic kidney disease: Secondary | ICD-10-CM | POA: Diagnosis not present

## 2024-01-22 LAB — BASIC METABOLIC PANEL WITH GFR
Anion gap: 17 — ABNORMAL HIGH (ref 5–15)
BUN: 25 mg/dL — ABNORMAL HIGH (ref 8–23)
CO2: 24 mmol/L (ref 22–32)
Calcium: 10.2 mg/dL (ref 8.9–10.3)
Chloride: 101 mmol/L (ref 98–111)
Creatinine, Ser: 1.32 mg/dL — ABNORMAL HIGH (ref 0.44–1.00)
GFR, Estimated: 43 mL/min — ABNORMAL LOW (ref >60)
Glucose, Bld: 125 mg/dL — ABNORMAL HIGH (ref 70–99)
Potassium: 4.2 mmol/L (ref 3.5–5.1)
Sodium: 142 mmol/L (ref 135–145)

## 2024-01-22 LAB — LIPID PANEL
Cholesterol: 232 mg/dL — ABNORMAL HIGH (ref 0–200)
HDL: 94 mg/dL (ref >40)
LDL Cholesterol: 123 mg/dL — ABNORMAL HIGH (ref 0–99)
Total CHOL/HDL Ratio: 2.5 ratio
Triglycerides: 77 mg/dL (ref <150)
VLDL: 15 mg/dL (ref 0–40)

## 2024-01-22 LAB — CBC
HCT: 46.6 % — ABNORMAL HIGH (ref 36.0–46.0)
Hemoglobin: 15 g/dL (ref 12.0–15.0)
MCH: 28.6 pg (ref 26.0–34.0)
MCHC: 32.2 g/dL (ref 30.0–36.0)
MCV: 88.9 fL (ref 80.0–100.0)
Platelets: 285 K/uL (ref 150–400)
RBC: 5.24 MIL/uL — ABNORMAL HIGH (ref 3.87–5.11)
RDW: 15.1 % (ref 11.5–15.5)
WBC: 4.2 K/uL (ref 4.0–10.5)
nRBC: 0 % (ref 0.0–0.2)

## 2024-01-22 LAB — HEMOGLOBIN A1C
Hgb A1c MFr Bld: 6.5 % — ABNORMAL HIGH (ref 4.8–5.6)
Mean Plasma Glucose: 139.85 mg/dL

## 2024-01-22 LAB — GLUCOSE, CAPILLARY
Glucose-Capillary: 107 mg/dL — ABNORMAL HIGH (ref 70–99)
Glucose-Capillary: 177 mg/dL — ABNORMAL HIGH (ref 70–99)
Glucose-Capillary: 107 mg/dL — ABNORMAL HIGH (ref 70–99)
Glucose-Capillary: 148 mg/dL — ABNORMAL HIGH (ref 70–99)

## 2024-01-22 LAB — TSH: TSH: 1.627 u[IU]/mL (ref 0.350–4.500)

## 2024-01-22 MED ORDER — ROSUVASTATIN CALCIUM 20 MG PO TABS
20.0000 mg | ORAL_TABLET | Freq: Every day | ORAL | Status: DC
  Administered 2024-01-23 – 2024-01-24 (×2): 20 mg via ORAL
  Filled 2024-01-22 (×2): qty 1

## 2024-01-22 MED ORDER — METOPROLOL SUCCINATE ER 50 MG PO TB24
50.0000 mg | ORAL_TABLET | Freq: Every day | ORAL | Status: DC
  Administered 2024-01-22 – 2024-01-24 (×3): 50 mg via ORAL
  Filled 2024-01-22 (×3): qty 1

## 2024-01-22 MED ORDER — ACETAMINOPHEN 325 MG PO TABS
650.0000 mg | ORAL_TABLET | Freq: Four times a day (QID) | ORAL | Status: DC | PRN
  Administered 2024-01-22 – 2024-01-24 (×4): 650 mg via ORAL
  Filled 2024-01-22 (×4): qty 2

## 2024-01-22 NOTE — Progress Notes (Signed)
 HD#1 SUBJECTIVE:  Patient Summary: Megan Fischer is a 73 y.o. with a pertinent PMH of A-fib on Eliquis , hypertension, hypothyroidism, CKD stage IIIb and type 2 diabetes, who presented with left-sided weakness and right gaze preference and admitted for acute right PCA infarct and management.   Overnight Events: No overnight events  Interim History: Patient says that she was able to eat a little bit yesterday no coughing or choking with this.  She passed her speech evaluation yesterday.  She states that she is still feeling weak on her feet and that when she was up and walking with PT today she was not able to move around as much she would like.  No other complaints at this time  OBJECTIVE:  Vital Signs: Vitals:   01/21/24 2058 01/21/24 2348 01/22/24 0423 01/22/24 0818  BP: (!) 164/105 (!) 166/84 (!) 162/98 (!) 156/110  Pulse: 95 81 83 93  Resp: 17 18 16 20   Temp: 98.2 F (36.8 C) 98.6 F (37 C) 98.2 F (36.8 C) 97.7 F (36.5 C)  TempSrc: Oral Oral Oral Oral  SpO2: 98% 100% 96% 100%  Weight:       Supplemental O2: Room Air SpO2: 100 %  Filed Weights   01/21/24 0700  Weight: 87 kg     Intake/Output Summary (Last 24 hours) at 01/22/2024 1114 Last data filed at 01/22/2024 1100 Gross per 24 hour  Intake 240 ml  Output 300 ml  Net -60 ml   Net IO Since Admission: -60 mL [01/22/24 1114]  Physical Exam: Physical Exam Constitutional:      General: She is not in acute distress. Neurological:     Mental Status: She is alert.     Comments: Alert and oriented 3 out of 5 strength in left arm 4 out of 5 strength in left leg Sensation intact Left peripheral vision decreased Extraocular movements intact     Patient Lines/Drains/Airways Status     Active Line/Drains/Airways     Name Placement date Placement time Site Days   Peripheral IV 01/21/24 20 G Right Antecubital 01/21/24  --  Antecubital  1            Pertinent labs and imaging:      Latest Ref Rng &  Units 01/22/2024    9:15 AM 01/21/2024    9:02 AM 01/21/2024    7:27 AM  CBC  WBC 4.0 - 10.5 K/uL 4.2  8.2    Hemoglobin 12.0 - 15.0 g/dL 84.9  86.7  86.0   Hematocrit 36.0 - 46.0 % 46.6  41.9  41.0   Platelets 150 - 400 K/uL 285  224         Latest Ref Rng & Units 01/22/2024    9:15 AM 01/21/2024    9:02 AM 01/21/2024    7:27 AM  CMP  Glucose 70 - 99 mg/dL 874  863  809   BUN 8 - 23 mg/dL 25  25  30    Creatinine 0.44 - 1.00 mg/dL 8.67  8.72  8.59   Sodium 135 - 145 mmol/L 142  141  143   Potassium 3.5 - 5.1 mmol/L 4.2  4.0  4.2   Chloride 98 - 111 mmol/L 101  102  106   CO2 22 - 32 mmol/L 24  26    Calcium  8.9 - 10.3 mg/dL 89.7  9.7    Total Protein 6.5 - 8.1 g/dL  8.0    Total Bilirubin 0.0 - 1.2 mg/dL  0.8    Alkaline Phos 38 - 126 U/L  95    AST 15 - 41 U/L  31    ALT 0 - 44 U/L  21      MR BRAIN WO CONTRAST Result Date: 01/21/2024 CLINICAL DATA:  Stroke, follow up. EXAM: MRI HEAD WITHOUT CONTRAST TECHNIQUE: Multiplanar, multiecho pulse sequences of the brain and surrounding structures were obtained without intravenous contrast. COMPARISON:  Head CT, CTA, and CTP 01/21/2024 FINDINGS: The study is intermittently moderately motion degraded. Brain: There is a moderately large acute right PCA infarct involving much of the occipital lobe and portions of the medial right temporal lobe and right thalamus. There is associated cytotoxic edema without midline shift or other significant mass effect. Chronic microhemorrhages are noted in the right frontal and likely right parietal lobes. T2 hyperintensities elsewhere in the cerebral white matter bilaterally are nonspecific but compatible with mild chronic small vessel ischemic disease. Moderate chronic small vessel changes are present in the pons. No mass or extra-axial fluid collection is evident. A partially empty sella is noted. Mild cerebral atrophy is within normal limits for age. Vascular: More fully evaluated on today's earlier CTA. Skull  and upper cervical spine: Unremarkable bone marrow signal. Sinuses/Orbits: Bilateral cataract extraction. Trace right mastoid fluid. Minimal left ethmoid sinus mucosal thickening. Other: None. IMPRESSION: 1. Moderately large acute right PCA infarct. 2. Mild chronic small vessel ischemic disease. Electronically Signed   By: Dasie Hamburg M.D.   On: 01/21/2024 16:58   ECHOCARDIOGRAM COMPLETE Result Date: 01/21/2024    ECHOCARDIOGRAM REPORT   Patient Name:   Megan Fischer Date of Exam: 01/21/2024 Medical Rec #:  [id number]     Height:       63.0 in Accession #:    [id number]    Weight:       191.8 lb Date of Birth:  01-18-1951    BSA:          1.900 m Patient Age:    72 years      BP:           183/97 mmHg Patient Gender: F             HR:           74 bpm. Exam Location:  Inpatient Procedure: 2D Echo, Cardiac Doppler, Color Doppler and Saline Contrast Bubble            Study (Both Spectral and Color Flow Doppler were utilized during            procedure). Indications:    Stroke  History:        Patient has prior history of Echocardiogram examinations, most                 recent 08/25/2021. Abnormal ECG, Stroke, Arrythmias:Atrial                 Fibrillation, Signs/Symptoms:Chest Pain; Risk                 Factors:Hypertension, Diabetes and Dyslipidemia.  Sonographer:    Ellouise Mose RDCS Referring Phys: [id number] JEFFREY C HATCHER IMPRESSIONS  1. Left ventricular ejection fraction, by estimation, is 60 to 65%. The left ventricle has normal function. The left ventricle has no regional wall motion abnormalities. There is mild left ventricular hypertrophy. Left ventricular diastolic parameters are indeterminate.  2. Right ventricular systolic function is normal. The right ventricular size is normal. There is moderately elevated pulmonary artery systolic pressure. The estimated  right ventricular systolic pressure is 56.2 mmHg.  3. The mitral valve is normal in structure. Trivial mitral valve regurgitation. No evidence of mitral  stenosis.  4. Tricuspid valve regurgitation is moderate.  5. The aortic valve is tricuspid. Aortic valve regurgitation is not visualized. No aortic stenosis is present.  6. Agitated saline contrast bubble study was negative, with no evidence of any interatrial shunt. Comparison(s): No significant change from prior study. FINDINGS  Left Ventricle: Left ventricular ejection fraction, by estimation, is 60 to 65%. The left ventricle has normal function. The left ventricle has no regional wall motion abnormalities. The left ventricular internal cavity size was normal in size. There is  mild left ventricular hypertrophy. Left ventricular diastolic parameters are indeterminate. Right Ventricle: The right ventricular size is normal. No increase in right ventricular wall thickness. Right ventricular systolic function is normal. There is moderately elevated pulmonary artery systolic pressure. The tricuspid regurgitant velocity is 3.21 m/s, and with an assumed right atrial pressure of 15 mmHg, the estimated right ventricular systolic pressure is 56.2 mmHg. Left Atrium: Left atrial size was normal in size. Right Atrium: Right atrial size was normal in size. Pericardium: There is no evidence of pericardial effusion. Mitral Valve: The mitral valve is normal in structure. Mild mitral annular calcification. Trivial mitral valve regurgitation. No evidence of mitral valve stenosis. Tricuspid Valve: The tricuspid valve is normal in structure. Tricuspid valve regurgitation is moderate . No evidence of tricuspid stenosis. Aortic Valve: The aortic valve is tricuspid. Aortic valve regurgitation is not visualized. No aortic stenosis is present. Pulmonic Valve: The pulmonic valve was normal in structure. Pulmonic valve regurgitation is mild. No evidence of pulmonic stenosis. Aorta: The aortic root and ascending aorta are structurally normal, with no evidence of dilitation. IAS/Shunts: No atrial level shunt detected by color flow Doppler.  Agitated saline contrast was given intravenously to evaluate for intracardiac shunting. Agitated saline contrast bubble study was negative, with no evidence of any interatrial shunt.  LEFT VENTRICLE PLAX 2D LVIDd:         4.10 cm LVIDs:         2.80 cm LV PW:         1.20 cm LV IVS:        1.20 cm LVOT diam:     2.20 cm LV SV:         70 LV SV Index:   37 LVOT Area:     3.80 cm  LV Volumes (MOD) LV vol d, MOD A2C: 74.0 ml LV vol d, MOD A4C: 79.2 ml LV vol s, MOD A2C: 29.8 ml LV vol s, MOD A4C: 34.7 ml LV SV MOD A2C:     44.2 ml LV SV MOD A4C:     79.2 ml LV SV MOD BP:      44.1 ml RIGHT VENTRICLE            IVC RV S prime:     9.36 cm/s  IVC diam: 1.60 cm TAPSE (M-mode): 0.9 cm LEFT ATRIUM             Index        RIGHT ATRIUM           Index LA diam:        4.00 cm 2.11 cm/m   RA Area:     15.70 cm LA Vol (A2C):   50.1 ml 26.37 ml/m  RA Volume:   40.90 ml  21.53 ml/m LA Vol (A4C):   51.3 ml  27.01 ml/m LA Biplane Vol: 51.1 ml 26.90 ml/m  AORTIC VALVE LVOT Vmax:   92.00 cm/s LVOT Vmean:  59.000 cm/s LVOT VTI:    0.184 m  AORTA Ao Root diam: 2.70 cm Ao Asc diam:  3.20 cm MITRAL VALVE               TRICUSPID VALVE MV Area (PHT): 3.31 cm    TR Peak grad:   41.2 mmHg MV Decel Time: 229 msec    TR Vmax:        321.00 cm/s MV E velocity: 84.90 cm/s                            SHUNTS                            Systemic VTI:  0.18 m                            Systemic Diam: 2.20 cm Aditya Sabharwal Electronically signed by Ria Commander Signature Date/Time: 01/21/2024/3:56:26 PM    Final     ASSESSMENT/PLAN:  Assessment: Principal Problem:   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 with a pertinent PMH of A-fib on Eliquis , hypertension, hypothyroidism,  CKD stage IIIb and type 2 diabetes, who presented with left-sided weakness and right gaze preference and admitted for acute right PCA infarct and management.   Plan: # Acute right PCA stroke Patient has continued rightward gaze preference and left upper and lower extremity weakness today.  She is still in the window of permissive hypertension blood pressures have been within that range.  We will try to manage blood pressure better tomorrow with restarting some of her home medications as tolerated.  Bubble study was negative for PFO.  MRI yesterday confirmed right PCA stroke -Stroke team on board - Continue to monitor on cardiac telemetry - A1c is 6.5 -Lipid panel shows cholesterol of 232 and LDL of 123.  LDL goal is less than 70 - Holding Eliquis  as patient has large penumbra and do not want to risk hemorrhagic conversion.  Patient will continue clopidogrel  75 mg daily as she has an aspirin  allergy - Continue to work with PT OT.  Patient is being evaluated for CIR - Continue permissive hypertension for the next 24 hours will assess blood pressure tomorrow and restart meds as tolerated  #Hyperlipidemia Patient's LDL is 123.  She was taking rosuvastatin  10 mg at home.  LDL goal is less than 70 so we will increase rosuvastatin  to 20 mg at this time   # Atrial fibrillation Patient is rate controlled at this time even without home metoprolol  being started.  Will hold off on Eliquis  at this time due to large penumbra and did not risk hemorrhagic conversion.  Will reassess need for metoprolol  tomorrow once patient is out of permissive hypertension window.  If she goes into RVR can restart metoprolol    # Hypertension Home meds include benazepril  20 mg and metoprolol  succinate 50 mg.  Blood pressures have been within the permissive hypertension range at this time with goal of systolic blood pressure less  than 220.  Will continue to hold home meds at this time and then reassess medicines tomorrow once  patient is out of permissive hypertension window  # Type 2 diabetes A1c is 6.5.  Last A1c noted was 7.8 on 10/12/2023.  Will hold off on SGLT2 inhibitor at this time as patient is in the range of permissive hypertension.  Will will reassess tomorrow. - Moderate SSI 3 times daily with meals and nightly while admitted  #CKD stage IIIb  Creatinine seems to be stable at this time by 1.32.  Will continue to monitor with BMP especially as patient has had contrast to look for any signs of contrast nephropathy Best Practice: Diet: carb modified IVF:  VTE: SCDs Start: 01/21/24 1044 Code: DNR  Disposition planning: Therapy Recs: CIR Family Contact: will need to call daughter DISPO: Anticipated discharge pending approval for placement and further stroke workup  Signature:  Laymon Stockert [facility]  11:14 AM, 01/22/2024  On Call pager [phone]

## 2024-01-22 NOTE — Progress Notes (Addendum)
 STROKE TEAM PROGRESS NOTE   INTERIM HISTORY/SUBJECTIVE Patient was resting comfortably and examined at bedside.  She continues to have left-sided weakness of her extremities and a facial droop and right gaze preference.  Complete left homonymous hemianopia also present.  Per chart review, patient has known aspirin  allergy.  We discussed this with her, she says this is only intolerability due to upset stomach whenever she has aspirin .  We discussed there are now enteric-coated aspirins that would help with any nausea she may experience, and she was agreeable to starting aspirin  when she restarts her Eliquis .  No other concerns at this time.  OBJECTIVE  CBC    Component Value Date/Time   WBC 4.2 01/22/2024 0915   RBC 5.24 (H) 01/22/2024 0915   HGB 15.0 01/22/2024 0915   HGB 13.3 10/10/2023 1526   HGB 12.0 02/16/2017 0802   HCT 46.6 (H) 01/22/2024 0915   HCT 42.2 10/10/2023 1526   HCT 36.8 02/16/2017 0802   PLT 285 01/22/2024 0915   PLT 253 10/10/2023 1526   MCV 88.9 01/22/2024 0915   MCV 94 10/10/2023 1526   MCV 89.3 02/16/2017 0802   MCH 28.6 01/22/2024 0915   MCHC 32.2 01/22/2024 0915   RDW 15.1 01/22/2024 0915   RDW 14.3 10/10/2023 1526   RDW 15.6 (H) 02/16/2017 0802   LYMPHSABS 0.9 01/21/2024 0902   LYMPHSABS 1.9 02/16/2017 0802   MONOABS 0.5 01/21/2024 0902   MONOABS 0.2 02/16/2017 0802   EOSABS 0.0 01/21/2024 0902   EOSABS 0.1 02/16/2017 0802   EOSABS 0.2 02/05/2006 0932   BASOSABS 0.0 01/21/2024 0902   BASOSABS 0.0 02/16/2017 0802    BMET    Component Value Date/Time   NA 142 01/22/2024 0915   NA 142 10/10/2023 1526   NA 143 02/16/2017 0802   K 4.2 01/22/2024 0915   K 3.5 02/16/2017 0802   CL 101 01/22/2024 0915   CO2 24 01/22/2024 0915   CO2 26 02/16/2017 0802   GLUCOSE 125 (H) 01/22/2024 0915   GLUCOSE 92 02/16/2017 0802   BUN 25 (H) 01/22/2024 0915   BUN 33 (H) 10/10/2023 1526   BUN 17.2 02/16/2017 0802   CREATININE 1.32 (H) 01/22/2024 0915   CREATININE  1.33 (H) 10/12/2023 0859   CREATININE 0.9 02/16/2017 0802   CALCIUM  10.2 01/22/2024 0915   CALCIUM  9.9 02/16/2017 0802   EGFR 43 (L) 10/12/2023 0859   EGFR 34 (L) 10/10/2023 1526   GFRNONAA 43 (L) 01/22/2024 0915   GFRNONAA 40 (L) 08/08/2023 0813    IMAGING past 24 hours MR BRAIN WO CONTRAST Result Date: 01/21/2024 CLINICAL DATA:  Stroke, follow up. EXAM: MRI HEAD WITHOUT CONTRAST TECHNIQUE: Multiplanar, multiecho pulse sequences of the brain and surrounding structures were obtained without intravenous contrast. COMPARISON:  Head CT, CTA, and CTP 01/21/2024 FINDINGS: The study is intermittently moderately motion degraded. Brain: There is a moderately large acute right PCA infarct involving much of the occipital lobe and portions of the medial right temporal lobe and right thalamus. There is associated cytotoxic edema without midline shift or other significant mass effect. Chronic microhemorrhages are noted in the right frontal and likely right parietal lobes. T2 hyperintensities elsewhere in the cerebral white matter bilaterally are nonspecific but compatible with mild chronic small vessel ischemic disease. Moderate chronic small vessel changes are present in the pons. No mass or extra-axial fluid collection is evident. A partially empty sella is noted. Mild cerebral atrophy is within normal limits for age. Vascular: More fully evaluated  on today's earlier CTA. Skull and upper cervical spine: Unremarkable bone marrow signal. Sinuses/Orbits: Bilateral cataract extraction. Trace right mastoid fluid. Minimal left ethmoid sinus mucosal thickening. Other: None. IMPRESSION: 1. Moderately large acute right PCA infarct. 2. Mild chronic small vessel ischemic disease. Electronically Signed   By: Dasie Hamburg M.D.   On: 01/21/2024 16:58   ECHOCARDIOGRAM COMPLETE Result Date: 01/21/2024    ECHOCARDIOGRAM REPORT   Patient Name:   Megan Fischer Date of Exam: 01/21/2024 Medical Rec #:  [id number]     Height:        63.0 in Accession #:    [id number]    Weight:       191.8 lb Date of Birth:  1951/01/01    BSA:          1.900 m Patient Age:    72 years      BP:           183/97 mmHg Patient Gender: F             HR:           74 bpm. Exam Location:  Inpatient Procedure: 2D Echo, Cardiac Doppler, Color Doppler and Fischer Contrast Bubble            Study (Both Spectral and Color Flow Doppler were utilized during            procedure). Indications:    Stroke  History:        Patient has prior history of Echocardiogram examinations, most                 recent 08/25/2021. Abnormal ECG, Stroke, Arrythmias:Atrial                 Fibrillation, Signs/Symptoms:Chest Pain; Risk                 Factors:Hypertension, Diabetes and Dyslipidemia.  Sonographer:    Ellouise Mose RDCS Referring Phys: [id number] JEFFREY C HATCHER IMPRESSIONS  1. Left ventricular ejection fraction, by estimation, is 60 to 65%. The left ventricle has normal function. The left ventricle has no regional wall motion abnormalities. There is mild left ventricular hypertrophy. Left ventricular diastolic parameters are indeterminate.  2. Right ventricular systolic function is normal. The right ventricular size is normal. There is moderately elevated pulmonary artery systolic pressure. The estimated right ventricular systolic pressure is 56.2 mmHg.  3. The mitral valve is normal in structure. Trivial mitral valve regurgitation. No evidence of mitral stenosis.  4. Tricuspid valve regurgitation is moderate.  5. The aortic valve is tricuspid. Aortic valve regurgitation is not visualized. No aortic stenosis is present.  6. Agitated Fischer contrast bubble study was negative, with no evidence of any interatrial shunt. Comparison(s): No significant change from prior study. FINDINGS  Left Ventricle: Left ventricular ejection fraction, by estimation, is 60 to 65%. The left ventricle has normal function. The left ventricle has no regional wall motion abnormalities. The left ventricular  internal cavity size was normal in size. There is  mild left ventricular hypertrophy. Left ventricular diastolic parameters are indeterminate. Right Ventricle: The right ventricular size is normal. No increase in right ventricular wall thickness. Right ventricular systolic function is normal. There is moderately elevated pulmonary artery systolic pressure. The tricuspid regurgitant velocity is 3.21 m/s, and with an assumed right atrial pressure of 15 mmHg, the estimated right ventricular systolic pressure is 56.2 mmHg. Left Atrium: Left atrial size was normal in size. Right Atrium: Right atrial size was normal  in size. Pericardium: There is no evidence of pericardial effusion. Mitral Valve: The mitral valve is normal in structure. Mild mitral annular calcification. Trivial mitral valve regurgitation. No evidence of mitral valve stenosis. Tricuspid Valve: The tricuspid valve is normal in structure. Tricuspid valve regurgitation is moderate . No evidence of tricuspid stenosis. Aortic Valve: The aortic valve is tricuspid. Aortic valve regurgitation is not visualized. No aortic stenosis is present. Pulmonic Valve: The pulmonic valve was normal in structure. Pulmonic valve regurgitation is mild. No evidence of pulmonic stenosis. Aorta: The aortic root and ascending aorta are structurally normal, with no evidence of dilitation. IAS/Shunts: No atrial level shunt detected by color flow Doppler. Agitated Fischer contrast was given intravenously to evaluate for intracardiac shunting. Agitated Fischer contrast bubble study was negative, with no evidence of any interatrial shunt.  LEFT VENTRICLE PLAX 2D LVIDd:         4.10 cm LVIDs:         2.80 cm LV PW:         1.20 cm LV IVS:        1.20 cm LVOT diam:     2.20 cm LV SV:         70 LV SV Index:   37 LVOT Area:     3.80 cm  LV Volumes (MOD) LV vol d, MOD A2C: 74.0 ml LV vol d, MOD A4C: 79.2 ml LV vol s, MOD A2C: 29.8 ml LV vol s, MOD A4C: 34.7 ml LV SV MOD A2C:     44.2 ml LV  SV MOD A4C:     79.2 ml LV SV MOD BP:      44.1 ml RIGHT VENTRICLE            IVC RV S prime:     9.36 cm/s  IVC diam: 1.60 cm TAPSE (M-mode): 0.9 cm LEFT ATRIUM             Index        RIGHT ATRIUM           Index LA diam:        4.00 cm 2.11 cm/m   RA Area:     15.70 cm LA Vol (A2C):   50.1 ml 26.37 ml/m  RA Volume:   40.90 ml  21.53 ml/m LA Vol (A4C):   51.3 ml 27.01 ml/m LA Biplane Vol: 51.1 ml 26.90 ml/m  AORTIC VALVE LVOT Vmax:   92.00 cm/s LVOT Vmean:  59.000 cm/s LVOT VTI:    0.184 m  AORTA Ao Root diam: 2.70 cm Ao Asc diam:  3.20 cm MITRAL VALVE               TRICUSPID VALVE MV Area (PHT): 3.31 cm    TR Peak grad:   41.2 mmHg MV Decel Time: 229 msec    TR Vmax:        321.00 cm/s MV E velocity: 84.90 cm/s                            SHUNTS                            Systemic VTI:  0.18 m                            Systemic Diam: 2.20 cm Leisure centre manager Electronically signed by Ria  Sabharwal Signature Date/Time: 01/21/2024/3:56:26 PM    Final     Vitals:   01/21/24 2348 01/22/24 0423 01/22/24 0818 01/22/24 1146  BP: (!) 166/84 (!) 162/98 (!) 156/110 (!) 175/98  Pulse: 81 83 93 89  Resp: 18 16 20 18   Temp: 98.6 F (37 C) 98.2 F (36.8 C) 97.7 F (36.5 C) 98 F (36.7 C)  TempSrc: Oral Oral Oral Oral  SpO2: 100% 96% 100%   Weight:       PHYSICAL EXAM General:  Alert, elderly patient in no acute distress Psych:  Mood and affect appropriate for situation CV: Regular rate and rhythm on monitor Respiratory:  Regular, unlabored respirations on room air  NEURO:  Mental Status: AA&Ox3, patient is able to give clear and coherent history Speech/Language: speech is without dysarthria or aphasia.  Naming, repetition, fluency, and comprehension intact.  Cranial Nerves:  II: PERRL. Left homonymous hemianopsia III, IV, VI: Right gaze preference, able to cross midline. Eyelids elevate symmetrically.  V: Sensation is intact to light touch and symmetrical to face.  VII: Left-sided facial  droop, no upper face involvement VIII: hearing intact to voice. IX, X: Palate elevates symmetrically. Phonation is normal.  KP:Dynloizm shrug 5/5. XII: tongue is midline without fasciculations. Motor: 5/5 strength to RLE and RUE, strength of LLE 4/5 and LUE 3/5 Tone: is normal and bulk is normal Sensation- Intact to light touch bilaterally. Extinction absent to light touch to DSS.   Coordination: FTN intact bilaterally, ataxic on left side but likely 2/2 to weakness. Drift/2 LUE Gait- deferred  Most Recent NIH 11   ASSESSMENT/PLAN  Megan Fischer is a 73 y.o. female with history of A-fib on Eliquis , diabetes, CKD dyslipidemia, hypertension, and chronic back pain admitted for left-sided weakness, left facial droop and right gaze preference, code stroke.  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    CTA head & neck showed occlusion of the right ICA at its origin with intracranial reconstitution, and proximal right P2 occlusion CT perfusion 9/42cc with 33 mL penumbra  MRI with moderate to large acute right PCA infarct and mild chronic small vessel disease 2D Echo EF 60-65%, mild LVH LDL 123 HgbA1c 6.5 UDS negative VTE prophylaxis -SCDs Eliquis  (apixaban ) daily prior to admission, now on clopidogrel  75 mg daily, plan to restart home Eliquis  and enteric-coated aspirin  on Thursday Therapy recommendations:  CIR Disposition: Pending  Atrial fibrillation Home Meds: Eliquis  and metoprolol  50 mg daily Continue telemetry monitoring  now on clopidogrel  75 mg daily, plan to restart home Eliquis  on Thursday  Hypertension Home meds: Lotensin  20 mg daily, metoprolol  50 mg daily Outside permissive hypertension window Resume home metoprolol  Currently  stable on high end Long-term BP goal normotensive  Hyperlipidemia Home meds: Rosuvastatin  10 mg and Zetia  10 LDL 123, goal < 70 Increase Crestor  20 mg daily, continue Zetia  10 mg daily Continue statin and Zetia  at discharge  Diabetes type II Controlled Home meds: Semaglutide  2 mg weekly, glimepiride  1 mg daily, Jardiance  25 mg daily HgbA1c 6.5, goal < 7.0 CBGs SSI Recommend close follow-up with PCP for better DM control  Dysphagia Patient has post-stroke dysphagia, SLP following, likely CIR candidate Currently on carb modified diet  Other Stroke Risk Factors Obesity, Body mass index is 33.98 kg/m., BMI >/=  30 associated with increased stroke risk, recommend weight loss, diet and exercise as appropriate  PVD, CTA showed right ICA occlusion, likely chronic Advanced age  Other Active Problems Chronic low back pain -diclofenac  gel as needed, baseline bilateral lower extremity weakness Hyperthyroidism -continue Tapazole  5 mg daily CKD -creatinine 1.40--1.27--1.32, primary team following  Hospital day # 1    ATTENDING NOTE: I reviewed above note and agree with the assessment and plan. Pt was seen and examined.   No family at bedside.  Patient lying in bed, awake, alert, eyes open, orientated to age, place, time. No aphasia, language appropriate but paucity of speech, following all simple commands. Able to name and repeat. No gaze palsy but left dense hemianopia, able to track bilaterally.  Left mild facial droop. Tongue midline.  Right upper and lower extremity 5/5, left upper extremity 3/5 and left lower extremity 4/5. Sensation decreased on the left, right FTN intact, left finger-nose ataxia but not out of proportion to the weakness, gait not tested.   Patient right PCA stroke is due to right PCA occlusion which could be from A-fib despite on Eliquis  versus large vessel disease given chronic right ICA occlusion.  Currently on Plavix .  Recommend to restart Eliquis  in 2 days given  size of stroke.  She had history of stomach upset with aspirin , however she agreed to take aspirin  EC 81 which no acute decrease GI side effects.  Once starting Eliquis  and aspirin , Plavix  can be discontinued.  Increase Crestor  from 10-20.  Continue Zetia .  PT and OT recommend CIR.  Will follow.   For detailed assessment and plan, please refer to above as I have made changes wherever appropriate.   Ary Cummins, MD PhD Stroke Neurology 01/22/2024 3:06 PM    To contact Stroke Continuity provider, please refer to [email or web address]. After hours, contact General Neurology

## 2024-01-22 NOTE — TOC CAGE-AID Note (Signed)
 Transition of Care [facility]) - CAGE-AID Screening   Patient Details  Name: Megan Fischer MRN: [id number] Date of Birth: Sep 07, 1950  [facility] CM/SW Contact:    Codie Krogh E Yunior Jain, LCSW Phone Number: 01/22/2024, 8:49 AM   Clinical Narrative: No SA noted.   CAGE-AID Screening:    Have You Ever Felt You Ought to Cut Down on Your Drinking or Drug Use?: No Have People Annoyed You By Critizing Your Drinking Or Drug Use?: No Have You Felt Bad Or Guilty About Your Drinking Or Drug Use?: No Have You Ever Had a Drink or Used Drugs First Thing In The Morning to Steady Your Nerves or to Get Rid of a Hangover?: No CAGE-AID Score: 0  Substance Abuse Education Offered: No

## 2024-01-22 NOTE — Plan of Care (Signed)

## 2024-01-22 NOTE — Progress Notes (Signed)
   01/22/24 1151  TOC Brief Assessment  Insurance and Status Reviewed  Patient has primary care physician Yes  Home environment has been reviewed home alone has good family support  Prior level of function: independent, uses cane  Prior/Current Home Services No current home services  Social Drivers of Health Review SDOH reviewed no interventions necessary  Readmission risk has been reviewed Yes  Transition of care needs transition of care needs identified, TOC will continue to follow     Pt admitted with acute large R PCA infarct, note CIR consult pending- has son and family that can provide good support.   Inpatient Care Management (ICM) will continue to monitor patient advancement through interdisciplinary progression rounds. If new patient transition needs arise, please place a ICM (CM/CSW) consult.

## 2024-01-22 NOTE — Progress Notes (Signed)

## 2024-01-22 NOTE — Evaluation (Signed)
 Occupational Therapy Evaluation Patient Details Name: Megan Fischer MRN: [id number] DOB: [birth year] Today's Date: 01/22/2024   History of Present Illness   Pt is a 73 y/o female presenting on 9/15 with L sided weakness and R gaze preference after falling. 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  PMH includes: afib, HTN, DM2, OA, bil TKR, R shoulder surgery.     Clinical Impressions PTA patient independent with ADLs, IADLs ( but not driving), and using cane for mobility in community.  Admitted for above and presents with problem list below, including L sided weakness, decreased coordination and poor proprioception, impaired balance, decreased activity tolerance, L inattention, L visual field deficits, and impaired cognition (problem solving, insight to deficits).  She has good support at home, family reporting they can provide increased assist as needed. Pt currently requires contact guard for bed mobility, mod assist +2 to stand and max assist +2 to pivot to recliner, min to total assist +2 for ADLs.  Based on performance today, believe pt will best benefit from continued OT services acutely and after dc at an inpatient setting with >3hrs/day to optimize independence, safety and return to PLOF with ADLs and mobility.     If plan is discharge home, recommend the following:   Two people to help with walking and/or transfers;Two people to help with bathing/dressing/bathroom;Assistance with cooking/housework;Direct supervision/assist for medications management;Direct supervision/assist for financial management;Assist for transportation;Help with stairs or ramp for entrance     Functional Status Assessment   Patient has had a recent decline in their functional status and demonstrates the ability to make significant improvements in function in a reasonable and predictable amount of time.     Equipment Recommendations   Other (comment) (defer)     Recommendations for Other Services    Speech consult;Rehab consult     Precautions/Restrictions   Precautions Precautions: Fall;Other (comment) Recall of Precautions/Restrictions: Impaired Precaution/Restrictions Comments: L homonymous hemianopsia Restrictions Weight Bearing Restrictions Per Provider Order: No     Mobility Bed Mobility Overal bed mobility: Needs Assistance Bed Mobility: Supine to Sit     Supine to sit: Contact guard     General bed mobility comments: Pt exiting R side of bed with increased time, use of rail, no physical assist required    Transfers Overall transfer level: Needs assistance Equipment used: Rolling walker (2 wheels), 2 person hand held assist Transfers: Sit to/from Stand, Bed to chair/wheelchair/BSC Sit to Stand: Mod assist, +2 physical assistance Stand pivot transfers: Max assist, +2 physical assistance         General transfer comment: ModA + 2 to stand up to RW, assist to place L hand up on handle and cues for pushing through R hand to power up to stand. Pt with increased left lateral lean and returned to sitting. Performed face to face transfer bed to chair towards R with maxA + 2, pt with difficulty clearing L foot and sequencing movement      Balance Overall balance assessment: Needs assistance Sitting-balance support: Feet supported Sitting balance-Leahy Scale: Fair Sitting balance - Comments: Fair statically   Standing balance support: Bilateral upper extremity supported, During functional activity Standing balance-Leahy Scale: Poor Standing balance comment: Requiring modA                           ADL either performed or assessed with clinical judgement   ADL Overall ADL's : Needs assistance/impaired     Grooming: Moderate assistance;Sitting  Upper Body Dressing : Moderate assistance;Sitting   Lower Body Dressing: Total assistance;+2 for physical assistance   Toilet Transfer: Maximal assistance;+2 for safety/equipment;+2 for  physical assistance Toilet Transfer Details (indicate cue type and reason): bil hand held to recliner towards R side         Functional mobility during ADLs: Moderate assistance;Maximal assistance;+2 for physical assistance;Cueing for safety;Cueing for sequencing General ADL Comments: pt limited by L weakness, impaired vision and balance.  decreased awareness of deficits, poor initation and problem solvng     Vision Baseline Vision/History: 1 Wears glasses Ability to See in Adequate Light: 0 Adequate Patient Visual Report: No change from baseline Vision Assessment?: Yes Eye Alignment: Within Functional Limits Ocular Range of Motion:  (not formally assessed) Alignment/Gaze Preference: Gaze right;Head turned Visual Fields: Left homonymous hemianopsia Additional Comments: needs further assessment of tracking abilities, pt able to briefly look at therapist on L side but did not formally asssess.  Quadrant testing and unable to locate # of fingers on L side, only able to see object at midline (nose). But able to scan towards L side to locate clock and read accurately.  Blurry vision up close and unable to read nametag, but able to read clock at distance.     Perception Perception: Impaired Preception Impairment Details: Inattention/Neglect Perception-Other Comments: L   Praxis Praxis: Impaired Praxis Impairment Details: Motor planning     Pertinent Vitals/Pain Pain Assessment Pain Assessment: No/denies pain     Extremity/Trunk Assessment Upper Extremity Assessment Upper Extremity Assessment: LUE deficits/detail;Generalized weakness;Right hand dominant;RUE deficits/detail RUE Deficits / Details: shoulder flexion to ~100* with crepitus noted, overall functional LUE Deficits / Details: grossly 3-/5, decreased propioception and awareness to UE, poor coodination and functional use. LUE Sensation: decreased light touch;decreased proprioception LUE Coordination: decreased fine  motor;decreased gross motor   Lower Extremity Assessment Lower Extremity Assessment: Defer to PT evaluation RLE Deficits / Details: WFL LLE Deficits / Details: Pt reports she has had 12 surgeries, on left lower extremity, tends to rest ankle in increased external rotation. Proximally, strength is 2-/5, distally ankle dorsiflexion 5/5 LLE Sensation: decreased proprioception (Sensation diminished on the left with extinction on double simultaneous stimulation)   Cervical / Trunk Assessment Cervical / Trunk Assessment: Kyphotic   Communication Communication Communication: No apparent difficulties   Cognition Arousal: Alert Behavior During Therapy: WFL for tasks assessed/performed Cognition: Cognition impaired   Orientation impairments: Time Awareness: Intellectual awareness intact, Online awareness impaired   Attention impairment (select first level of impairment): Selective attention Executive functioning impairment (select all impairments): Organization, Sequencing, Reasoning, Problem solving, Initiation OT - Cognition Comments: patient unaware of day of week, able to recall after reoriented.  decreased awareness of deficits and safety, poor problem solving                 Following commands: Impaired Following commands impaired: Follows one step commands with increased time     Cueing  General Comments   Cueing Techniques: Verbal cues;Tactile cues  son at side and supportive   Exercises     Shoulder Instructions      Home Living Family/patient expects to be discharged to:: Private residence Living Arrangements: Alone Available Help at Discharge: Family;Available 24 hours/day Type of Home: Apartment Home Access: Level entry     Home Layout: One level     Bathroom Shower/Tub: Chief Strategy Officer: Standard     Home Equipment: Grab bars - tub/shower;Cane - single point;Toilet riser   Additional Comments: senior  living apt      Prior  Functioning/Environment Prior Level of Function : Independent/Modified Independent             Mobility Comments: cane PRN ADLs Comments: does not drive, ind ADLs, IADls (fiances and meds)    OT Problem List: Decreased strength;Decreased range of motion;Decreased activity tolerance;Impaired balance (sitting and/or standing);Impaired vision/perception;Decreased coordination;Decreased cognition;Decreased safety awareness;Decreased knowledge of use of DME or AE;Decreased knowledge of precautions;Impaired sensation;Pain;Impaired UE functional use;Obesity   OT Treatment/Interventions: Self-care/ADL training;Therapeutic exercise;DME and/or AE instruction;Therapeutic activities;Cognitive remediation/compensation;Patient/family education;Balance training;Visual/perceptual remediation/compensation;Neuromuscular education      OT Goals(Current goals can be found in the care plan section)   Acute Rehab OT Goals Patient Stated Goal: home OT Goal Formulation: With patient Time For Goal Achievement: 02/05/24 Potential to Achieve Goals: Good   OT Frequency:  Min 2X/week    Co-evaluation PT/OT/SLP Co-Evaluation/Treatment: Yes Reason for Co-Treatment: Complexity of the patient's impairments (multi-system involvement);For patient/therapist safety;To address functional/ADL transfers PT goals addressed during session: Mobility/safety with mobility OT goals addressed during session: ADL's and self-care      AM-PAC OT 6 Clicks Daily Activity     Outcome Measure Help from another person eating meals?: A Little Help from another person taking care of personal grooming?: A Lot Help from another person toileting, which includes using toliet, bedpan, or urinal?: Total Help from another person bathing (including washing, rinsing, drying)?: A Lot Help from another person to put on and taking off regular upper body clothing?: A Lot Help from another person to put on and taking off regular lower body  clothing?: Total 6 Click Score: 11   End of Session Equipment Utilized During Treatment: Gait belt Nurse Communication: Mobility status;Need for lift equipment;Precautions  Activity Tolerance: Patient tolerated treatment well Patient left: in chair;with call bell/phone within reach;with chair alarm set  OT Visit Diagnosis: Other abnormalities of gait and mobility (R26.89);Muscle weakness (generalized) (M62.81);Hemiplegia and hemiparesis;Other symptoms and signs involving cognitive function;Low vision, both eyes (H54.2) Hemiplegia - Right/Left: Left Hemiplegia - dominant/non-dominant: Non-Dominant Hemiplegia - caused by: Cerebral infarction                Time: 9180-9146 OT Time Calculation (min): 34 min Charges:  OT General Charges $OT Visit: 1 Visit OT Evaluation $OT Eval Moderate Complexity: 1 Mod  Etta NOVAK, OT Acute Rehabilitation Services Office [phone] Secure Chat Preferred    Etta GORMAN Hope 01/22/2024, 10:01 AM

## 2024-01-22 NOTE — Evaluation (Signed)
 Physical Therapy Evaluation Patient Details Name: Megan Fischer MRN: [id number] DOB: 1950/12/20 Today's Date: 01/22/2024  History of Present Illness  Pt is a 73 y/o female presenting on 9/15 with L sided weakness and R gaze preference after falling. 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  PMH includes: afib, HTN, DM2, OA, bil TKR, R shoulder surgery.  Clinical Impression  PTA, pt lives alone and is independent with a cane; does not drive but typically completes her ADL's/IADL's independently. Pt son present at bedside and supportive; reports between himself and other family members, will provide necessary assist at d/c. Pt presents with right gaze preference, left inattention, decreased left sided proprioception, left sided weakness, impaired cognition, and vision. Pt A&Ox4, however, has decreased insight into deficits. Pt able to exit right side of bed without physical assist. Requiring mod-maxA + 2 for stand pivot transfers from bed to chair with left lateral lean and difficulty clearing L foot. Patient will benefit from intensive inpatient follow-up therapy, >3 hours/day in order to address deficits, maximize functional mobility and decrease caregiver burden.        If plan is discharge home, recommend the following: Two people to help with walking and/or transfers;Two people to help with bathing/dressing/bathroom   Can travel by private vehicle        Equipment Recommendations BSC/3in1;Wheelchair (measurements PT);Wheelchair cushion (measurements PT)  Recommendations for Other Services  Rehab consult    Functional Status Assessment Patient has had a recent decline in their functional status and demonstrates the ability to make significant improvements in function in a reasonable and predictable amount of time.     Precautions / Restrictions Precautions Precautions: Fall;Other (comment) Recall of Precautions/Restrictions: Impaired Precaution/Restrictions Comments: L homonymous  hemianopsia Restrictions Weight Bearing Restrictions Per Provider Order: No      Mobility  Bed Mobility Overal bed mobility: Needs Assistance Bed Mobility: Supine to Sit     Supine to sit: Contact guard     General bed mobility comments: Pt exiting R side of bed with increased time, use of rail, no physical assist required    Transfers Overall transfer level: Needs assistance Equipment used: Rolling walker (2 wheels), 2 person hand held assist Transfers: Sit to/from Stand, Bed to chair/wheelchair/BSC Sit to Stand: Mod assist, +2 physical assistance Stand pivot transfers: Max assist, +2 physical assistance         General transfer comment: ModA + 2 to stand up to RW, assist to place L hand up on handle and cues for pushing through R hand to power up to stand. Pt with increased left lateral lean and returned to sitting. Performed face to face transfer bed to chair towards R with maxA + 2, pt with difficulty clearing L foot and sequencing movement    Ambulation/Gait               General Gait Details: unable  Stairs            Wheelchair Mobility     Tilt Bed    Modified Rankin (Stroke Patients Only) Modified Rankin (Stroke Patients Only) Pre-Morbid Rankin Score: Slight disability Modified Rankin: Severe disability     Balance Overall balance assessment: Needs assistance Sitting-balance support: Feet supported Sitting balance-Leahy Scale: Fair Sitting balance - Comments: Fair statically   Standing balance support: Bilateral upper extremity supported Standing balance-Leahy Scale: Poor Standing balance comment: Requiring modA  Pertinent Vitals/Pain Pain Assessment Pain Assessment: No/denies pain    Home Living Family/patient expects to be discharged to:: Private residence Living Arrangements: Alone Available Help at Discharge: Family;Available 24 hours/day Type of Home: Apartment Home Access: Level  entry       Home Layout: One level Home Equipment: Grab bars - tub/shower;Cane - single point;Toilet riser Additional Comments: senior living apt    Prior Function Prior Level of Function : Independent/Modified Independent             Mobility Comments: cane PRN ADLs Comments: does not drive, ind ADLs, IADls (fiances and meds)     Extremity/Trunk Assessment   Upper Extremity Assessment Upper Extremity Assessment: Defer to OT evaluation    Lower Extremity Assessment Lower Extremity Assessment: RLE deficits/detail;LLE deficits/detail RLE Deficits / Details: WFL LLE Deficits / Details: Pt reports she has had 12 surgeries, on left lower extremity, tends to rest ankle in increased external rotation. Proximally, strength is 2-/5, distally ankle dorsiflexion 5/5 LLE Sensation: decreased proprioception (Sensation diminished on the left with extinction on double simultaneous stimulation)    Cervical / Trunk Assessment Cervical / Trunk Assessment: Kyphotic  Communication   Communication Communication: No apparent difficulties    Cognition Arousal: Alert Behavior During Therapy: WFL for tasks assessed/performed   PT - Cognitive impairments: Awareness, Attention, Sequencing, Problem solving, Safety/Judgement                       PT - Cognition Comments: Pt A&Ox4, not oriented to day of week, but able to recall after reminder. Pt with significantly decreased awareness of deficits, reports vision changes are not new. Following commands: Impaired Following commands impaired: Follows one step commands with increased time     Cueing Cueing Techniques: Verbal cues, Tactile cues     General Comments      Exercises     Assessment/Plan    PT Assessment Patient needs continued PT services  PT Problem List Decreased strength;Decreased activity tolerance;Decreased balance;Decreased mobility;Decreased cognition;Decreased safety awareness       PT Treatment  Interventions DME instruction;Gait training;Functional mobility training;Therapeutic activities;Therapeutic exercise;Balance training;Cognitive remediation;Neuromuscular re-education;Patient/family education    PT Goals (Current goals can be found in the Care Plan section)  Acute Rehab PT Goals Patient Stated Goal: go home PT Goal Formulation: With patient/family Time For Goal Achievement: 02/05/24 Potential to Achieve Goals: Fair    Frequency Min 3X/week     Co-evaluation PT/OT/SLP Co-Evaluation/Treatment: Yes Reason for Co-Treatment: Complexity of the patient's impairments (multi-system involvement);For patient/therapist safety;To address functional/ADL transfers PT goals addressed during session: Mobility/safety with mobility         AM-PAC PT 6 Clicks Mobility  Outcome Measure Help needed turning from your back to your side while in a flat bed without using bedrails?: A Little Help needed moving from lying on your back to sitting on the side of a flat bed without using bedrails?: A Little Help needed moving to and from a bed to a chair (including a wheelchair)?: A Lot Help needed standing up from a chair using your arms (e.g., wheelchair or bedside chair)?: A Lot Help needed to walk in hospital room?: Total Help needed climbing 3-5 steps with a railing? : Total 6 Click Score: 12    End of Session Equipment Utilized During Treatment: Gait belt Activity Tolerance: Patient tolerated treatment well Patient left: in chair;with call bell/phone within reach;with chair alarm set Nurse Communication: Mobility status PT Visit Diagnosis: Unsteadiness on feet (R26.81);Difficulty in walking,  not elsewhere classified (R26.2);Other symptoms and signs involving the nervous system (R29.898);Hemiplegia and hemiparesis Hemiplegia - Right/Left: Left Hemiplegia - dominant/non-dominant: Non-dominant Hemiplegia - caused by: Cerebral infarction    Time: 9180-9146 PT Time Calculation (min)  (ACUTE ONLY): 34 min   Charges:   PT Evaluation $PT Eval Moderate Complexity: 1 Mod   PT General Charges $$ ACUTE PT VISIT: 1 Visit         Aleck Daring, PT, DPT Acute Rehabilitation Services Office [phone]   Aleck ONEIDA Daring 01/22/2024, 9:41 AM

## 2024-01-23 DIAGNOSIS — I63532 Cerebral infarction due to unspecified occlusion or stenosis of left posterior cerebral artery: Secondary | ICD-10-CM | POA: Diagnosis not present

## 2024-01-23 DIAGNOSIS — Z794 Long term (current) use of insulin: Secondary | ICD-10-CM

## 2024-01-23 DIAGNOSIS — I10 Essential (primary) hypertension: Secondary | ICD-10-CM

## 2024-01-23 DIAGNOSIS — N1832 Chronic kidney disease, stage 3b: Secondary | ICD-10-CM

## 2024-01-23 DIAGNOSIS — I63431 Cerebral infarction due to embolism of right posterior cerebral artery: Secondary | ICD-10-CM | POA: Diagnosis not present

## 2024-01-23 DIAGNOSIS — I63531 Cerebral infarction due to unspecified occlusion or stenosis of right posterior cerebral artery: Secondary | ICD-10-CM | POA: Diagnosis not present

## 2024-01-23 DIAGNOSIS — R29704 NIHSS score 4: Secondary | ICD-10-CM

## 2024-01-23 DIAGNOSIS — E785 Hyperlipidemia, unspecified: Secondary | ICD-10-CM

## 2024-01-23 DIAGNOSIS — I129 Hypertensive chronic kidney disease with stage 1 through stage 4 chronic kidney disease, or unspecified chronic kidney disease: Secondary | ICD-10-CM | POA: Diagnosis not present

## 2024-01-23 DIAGNOSIS — E1169 Type 2 diabetes mellitus with other specified complication: Secondary | ICD-10-CM

## 2024-01-23 DIAGNOSIS — I4891 Unspecified atrial fibrillation: Secondary | ICD-10-CM | POA: Diagnosis not present

## 2024-01-23 DIAGNOSIS — I779 Disorder of arteries and arterioles, unspecified: Secondary | ICD-10-CM | POA: Diagnosis not present

## 2024-01-23 LAB — BASIC METABOLIC PANEL WITH GFR
Anion gap: 18 — ABNORMAL HIGH (ref 5–15)
BUN: 29 mg/dL — ABNORMAL HIGH (ref 8–23)
CO2: 21 mmol/L — ABNORMAL LOW (ref 22–32)
Calcium: 9.4 mg/dL (ref 8.9–10.3)
Chloride: 103 mmol/L (ref 98–111)
Creatinine, Ser: 1.45 mg/dL — ABNORMAL HIGH (ref 0.44–1.00)
GFR, Estimated: 38 mL/min — ABNORMAL LOW (ref >60)
Glucose, Bld: 121 mg/dL — ABNORMAL HIGH (ref 70–99)
Potassium: 4.1 mmol/L (ref 3.5–5.1)
Sodium: 142 mmol/L (ref 135–145)

## 2024-01-23 LAB — GLUCOSE, CAPILLARY
Glucose-Capillary: 103 mg/dL — ABNORMAL HIGH (ref 70–99)
Glucose-Capillary: 106 mg/dL — ABNORMAL HIGH (ref 70–99)
Glucose-Capillary: 123 mg/dL — ABNORMAL HIGH (ref 70–99)
Glucose-Capillary: 132 mg/dL — ABNORMAL HIGH (ref 70–99)

## 2024-01-23 MED ORDER — ASPIRIN 81 MG PO TBEC
81.0000 mg | DELAYED_RELEASE_TABLET | Freq: Every day | ORAL | Status: DC
Start: 2024-01-24 — End: 2024-01-24
  Administered 2024-01-24: 81 mg via ORAL
  Filled 2024-01-23: qty 1

## 2024-01-23 MED ORDER — APIXABAN 5 MG PO TABS
5.0000 mg | ORAL_TABLET | Freq: Two times a day (BID) | ORAL | Status: DC
Start: 2024-01-24 — End: 2024-01-24
  Administered 2024-01-24: 5 mg via ORAL
  Filled 2024-01-23: qty 1

## 2024-01-23 MED ORDER — EMPAGLIFLOZIN 25 MG PO TABS
25.0000 mg | ORAL_TABLET | Freq: Every day | ORAL | Status: DC
  Administered 2024-01-24: 25 mg via ORAL
  Filled 2024-01-23 (×2): qty 1

## 2024-01-23 MED ORDER — BENAZEPRIL HCL 20 MG PO TABS
20.0000 mg | ORAL_TABLET | Freq: Every day | ORAL | Status: DC
  Administered 2024-01-23 – 2024-01-24 (×2): 20 mg via ORAL
  Filled 2024-01-23 (×2): qty 1

## 2024-01-23 MED ORDER — AMLODIPINE BESYLATE 5 MG PO TABS
10.0000 mg | ORAL_TABLET | Freq: Every day | ORAL | Status: DC
  Administered 2024-01-23 – 2024-01-24 (×2): 10 mg via ORAL
  Filled 2024-01-23 (×2): qty 2

## 2024-01-23 NOTE — Progress Notes (Addendum)
 HD#2 SUBJECTIVE:  Patient Summary: Megan Fischer is a 73 y.o. with a pertinent PMH of A-fib on Eliquis , hypertension, hypothyroidism, CKD stage IIIb and type 2 diabetes, who presented with left-sided weakness and right gaze preference and admitted for acute right PCA infarct and management.   Overnight Events: No overnight events  Interim History: Patient is awake at bedside this morning with her son sitting with her.  She is saying that she still feels weak on her left side.  She was able to work with physical therapy yesterday.  She also endorses eating some.  Yesterday not having any issues with choking. or swallowing OBJECTIVE:  Vital Signs: Vitals:   01/22/24 1954 01/23/24 0018 01/23/24 0304 01/23/24 0801  BP: (!) 159/107 (!) 159/109 (!) 168/106 (!) 171/96  Pulse: 99 89 87 82  Resp: 18 18 18 20   Temp: 98.6 F (37 C) (!) 97.5 F (36.4 C) 98 F (36.7 C) 98 F (36.7 C)  TempSrc: Oral Oral Oral Oral  SpO2: 91% 99% 100% 98%  Weight:      Height:       Supplemental O2: Room Air SpO2: 98 %  Filed Weights   01/21/24 0700 01/22/24 1300  Weight: 87 kg 87 kg     Intake/Output Summary (Last 24 hours) at 01/23/2024 0929 Last data filed at 01/22/2024 1700 Gross per 24 hour  Intake 560 ml  Output 300 ml  Net 260 ml   Net IO Since Admission: -40 mL [01/23/24 0929]  Physical Exam: Physical Exam Constitutional:      General: She is not in acute distress. Cardiovascular:     Rate and Rhythm: Normal rate. Rhythm irregular.     Heart sounds: No murmur heard. Pulmonary:     Effort: Pulmonary effort is normal. No respiratory distress.  Neurological:     Mental Status: She is alert.     Comments: Alert and oriented Left Homonymous hemianopia  Seems to have some improved grip strength and left lower extremity strength     Patient Lines/Drains/Airways Status     Active Line/Drains/Airways     Name Placement date Placement time Site Days   Peripheral IV 01/21/24 20 G Right  Antecubital 01/21/24  --  Antecubital  1            Pertinent labs and imaging:      Latest Ref Rng & Units 01/22/2024    9:15 AM 01/21/2024    9:02 AM 01/21/2024    7:27 AM  CBC  WBC 4.0 - 10.5 K/uL 4.2  8.2    Hemoglobin 12.0 - 15.0 g/dL 84.9  86.7  86.0   Hematocrit 36.0 - 46.0 % 46.6  41.9  41.0   Platelets 150 - 400 K/uL 285  224         Latest Ref Rng & Units 01/23/2024    7:39 AM 01/22/2024    9:15 AM 01/21/2024    9:02 AM  CMP  Glucose 70 - 99 mg/dL 878  874  863   BUN 8 - 23 mg/dL 29  25  25    Creatinine 0.44 - 1.00 mg/dL 8.54  8.67  8.72   Sodium 135 - 145 mmol/L 142  142  141   Potassium 3.5 - 5.1 mmol/L 4.1  4.2  4.0   Chloride 98 - 111 mmol/L 103  101  102   CO2 22 - 32 mmol/L 21  24  26    Calcium  8.9 - 10.3 mg/dL 9.4  10.2  9.7   Total Protein 6.5 - 8.1 g/dL   8.0   Total Bilirubin 0.0 - 1.2 mg/dL   0.8   Alkaline Phos 38 - 126 U/L   95   AST 15 - 41 U/L   31   ALT 0 - 44 U/L   21     No results found.   ASSESSMENT/PLAN:  Assessment: Principal Problem:   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 with a pertinent PMH of A-fib on Eliquis , hypertension, hypothyroidism, CKD stage IIIb and type 2 diabetes, who presented with left-sided weakness and right gaze preference and admitted for acute right PCA infarct and management.   Plan: # Acute right PCA stroke Patient's left-sided upper and lower extremity weakness seems to be somewhat better today with still having left homonymous hemianopsia.  Is being evaluated for CIR today -Stroke team on board - A1c is 6.5, will of less than 7  -Lipid panel shows cholesterol of 232 and LDL of 123.  LDL goal is less than 70 - Will restart Eliquis  and aspirin  81 mg  tomorrow and discontinue Plavix  - Continue to work with PT OT.  Patient is being evaluated for CIR today - Massive hypertension window is over and now goal is normotensive  #Hyperlipidemia Patient's LDL is 123.   -Continue rosuvastatin  20 mg - Continue ezetimibe  10 mg   # Atrial fibrillation Patient is rate controlled at this time even without home metoprolol  being started.  Restarted metoprolol  which is patient's home medication.  Rates have been well-controlled.  Will restart Eliquis  tomorrow  # Hypertension Home meds include benazepril  20 mg and metoprolol  succinate 50 mg.  Patient is outside of permissive hypertension window.  Have restarted her benazepril  20 and SGLT2 inhibitor.  Her goal blood pressure is normotensive at this time.  She will need stricter blood pressure control and may need to add or adjust other medications.  # Type 2 diabetes A1c is 6.5.  Goal of less than 7.  Will restart SGLT2 inhibitor at this time as patient is out of the range for permissive hypertension  - Moderate SSI 3 times daily with meals and nightly while admitted  #CKD stage IIIb  Creatinine has been fluctuating since patient came into the hospital.  Currently at 1.45 and patient came in at 1.4.  Has had some periodic improvements in GFR as stated above stable.  Patient is also gone in some contrasted that could be the cause of fluctuations.   Best Practice: Diet: carb modified IVF:  VTE: SCDs Start: 01/21/24 1044 Code: DNR  Disposition planning: Therapy Recs: CIR, pending evaluation Family Contact: Updated at bedside DISPO: Anticipated discharge pending approval for placement and further stroke workup  Signature:  Tiarrah Saville [facility]  9:29 AM, 01/23/2024  On Call pager [phone]

## 2024-01-23 NOTE — Consult Note (Addendum)
 Physical Medicine and Rehabilitation Consult Reason for Consult:Rehab Referring Physician: Dr. Kem   HPI: Megan Fischer is a 73 y.o. female with past medical history of A-fib, diabetes mellitus, dyslipidemia, hypertension, obesity, osteoarthritis, CKD 3B who was admitted after he woke up in the morning with left-sided weakness and when she got up had a fall.  Patient was also noted to have left facial droop and complete left homonymous hemianopsia.  CT head showed moderately large acute to subacute right PCA infarct.  MRI of the brain indicated moderately large acute right PCA infarct and mild chronic small vessel ischemic disease.  CT of the C-spine showed multilevel disc and facet degeneration.  Allergy feels like etiology is likely cardioembolic versus large vessel disease.  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  Patient now on clopidogrel  75 mg with plan to restart home Eliquis  and enteric-coated aspirin  on Thursday.   She is on a  carb modified diet. Patient seen by PT and OT and found to be a candidate for CIR.  Patient required mod assistance for upper body dressing, total assistance for lower body dressing, max assistance for toilet transfers, mod assist +2 for sit to stand, max assist for stand pivot transfers.   Per chart review she lives in a 1 level home with level entry.  Family can assist 24/7 after discharge.    Home: Home Living Family/patient expects to be discharged to:: Private residence (per OT note) Living Arrangements: Alone Available Help at Discharge: Family, Available 24 hours/day Type of Home: Apartment Home Access: Level entry Home Layout: One level Bathroom Shower/Tub: Engineer, manufacturing systems: Standard Home Equipment: Grab bars - tub/shower, Medical laboratory scientific officer - single point, Toilet riser Additional Comments: senior living apt  Functional  History: Prior Function Prior Level of Function : Independent/Modified Independent Mobility Comments: cane PRN ADLs Comments: does not drive, ind ADLs, IADls (fiances and meds) Functional Status:  Mobility: Bed Mobility Overal bed mobility: Needs Assistance Bed Mobility: Supine to Sit Supine to sit: Contact guard General bed mobility comments: Pt exiting R side of bed with increased time, use of rail, no physical assist required Transfers Overall transfer level: Needs assistance Equipment used: Rolling walker (2 wheels), 2 person hand held assist Transfers: Sit to/from Stand, Bed to chair/wheelchair/BSC Sit to Stand: Mod assist, +2 physical assistance Bed to/from chair/wheelchair/BSC transfer type:: Stand pivot Stand pivot transfers: Max assist, +2 physical assistance General transfer comment: ModA + 2 to stand up to RW, assist to place L hand up on handle and cues for pushing through R hand to power up to stand. Pt with increased left lateral lean and returned to sitting. Performed face to face transfer bed to chair towards R with maxA + 2, pt with difficulty clearing L foot and sequencing movement Ambulation/Gait General Gait Details: unable    ADL: ADL Overall ADL's : Needs assistance/impaired Grooming: Moderate assistance, Sitting Upper Body Dressing : Moderate assistance, Sitting Lower Body Dressing: Total assistance, +2 for physical assistance Toilet Transfer: Maximal assistance, +2 for safety/equipment, +2 for physical assistance Toilet Transfer Details (indicate cue type and reason): bil hand held to recliner towards R side Functional mobility during ADLs: Moderate assistance, Maximal assistance, +2 for physical assistance, Cueing for safety, Cueing for sequencing General ADL Comments: pt limited by L weakness, impaired vision and balance.  decreased awareness of deficits, poor initation and problem solvng  Cognition: Cognition Orientation Level: Oriented  X4 Cognition Arousal: Alert Behavior During Therapy: WFL for tasks assessed/performed   Review of Systems  Constitutional:  Negative for fever.  HENT:  Negative for hearing loss.   Eyes:  Negative for double vision.       Vision changes on the right  Respiratory:  Negative for cough and shortness of breath.   Cardiovascular:  Negative for chest pain.  Gastrointestinal:  Negative for abdominal pain, constipation, diarrhea, nausea and vomiting.  Genitourinary: Negative.   Musculoskeletal:  Negative for back pain, joint pain and neck pain.  Skin:  Negative for rash.  Neurological:  Positive for headaches. Negative for dizziness and sensory change.  Psychiatric/Behavioral:  The patient does not have insomnia.    Past Medical History:  Diagnosis Date   Atrial fibrillation (HCC)    Chronic disease anemia 03/29/2011   Diabetes mellitus    Dyslipidemia    Hypertension    Obesities, morbid (HCC)    Osteoarthritis    Past Surgical History:  Procedure Laterality Date   CHOLECYSTECTOMY     REPLACEMENT TOTAL KNEE BILATERAL     SHOULDER SURGERY     RIGHT SHOULDER   TUBAL LIGATION     Family History  Problem Relation Age of Onset   Heart attack Mother    Stroke Mother    Diabetes Mother    Hypertension Brother    Breast cancer Neg Hx    Social History:  reports that she has never smoked. She has never used smokeless tobacco. She reports that she does not drink alcohol and does not use drugs. Allergies:  Allergies  Allergen Reactions   Aspirin  Nausea Only    Patient stated to neuro that it caused an upset stomach   Codeine Other (See Comments)    Unknown    Fentanyl  Other (See Comments)    hypoxia   Oxycodone Hcl Other (See Comments)    Unknown    Penicillins Other (See Comments)    Unknown    Chlorhexidine Gluconate Rash   Medications Prior to Admission  Medication Sig Dispense Refill   apixaban  (ELIQUIS ) 5 MG TABS tablet Take 1 tablet (5 mg total) by mouth 2 (two)  times daily. 180 tablet 1   benazepril  (LOTENSIN ) 20 MG tablet Take 1 tablet (20 mg total) by mouth daily. 30 tablet 11   CALCIUM  PO Take 1 tablet by mouth 2 (two) times daily.     diltiazem  (CARDIZEM  CD) 360 MG 24 hr capsule TAKE 1 CAPSULE BY MOUTH DAILY 100 capsule 2   DULoxetine  (CYMBALTA ) 60 MG capsule Take 60 mg by mouth daily.     empagliflozin  (JARDIANCE ) 25 MG TABS tablet Take 1 tablet (25 mg total) by mouth daily before breakfast. 100 tablet 3   ergocalciferol (VITAMIN D2) 50000 UNITS capsule Take 50,000 Units by mouth once a week.     ezetimibe  (ZETIA ) 10 MG tablet Take 1 tablet (10 mg total) by mouth daily. 90 tablet 2   ferrous sulfate  325 (65 FE) MG EC tablet Take 1 tablet (325 mg total) by mouth 2 (two) times daily. (Patient taking differently: Take 325 mg by mouth daily.) 60 tablet 3   furosemide  (LASIX ) 40 MG tablet Take 1 tablet (40 mg total) by mouth daily. 30 tablet 2   glimepiride  (AMARYL ) 1 MG tablet Take 1 tablet (1 mg total) by mouth daily with breakfast. 90 tablet 4   methimazole  (TAPAZOLE ) 5 MG tablet Take 1 tablet (5 mg total) by  mouth daily. 90 tablet 3   methocarbamol  (ROBAXIN ) 500 MG tablet TAKE 2 TABLETS BY MOUTH EVERY 6  HOURS AS NEEDED FOR MUSCLE  SPASMS 240 tablet 2   metoprolol  succinate (TOPROL -XL) 50 MG 24 hr tablet TAKE 1 TABLET BY MOUTH DAILY  WITH OR IMMEDIATELY FOLLOWING A  MEAL 90 tablet 3   rosuvastatin  (CRESTOR ) 10 MG tablet Take 1 tablet (10 mg total) by mouth daily. 90 tablet 3   Semaglutide , 2 MG/DOSE, (OZEMPIC , 2 MG/DOSE,) 8 MG/3ML SOPN INJECT SUBCUTANEOUSLY 2 MG EVERY WEEK 9 mL 3   mupirocin  ointment (BACTROBAN ) 2 % Apply 1 Application topically 2 (two) times daily. Apply to finger wound twice daily and cover with dry bandage (Patient not taking: Reported on 01/22/2024) 22 g 0   ONETOUCH VERIO test strip USE AS DIRECTED TO CHECK  BLOOD SUGAR TWICE DAILY 200 strip 3     Blood pressure (!) 171/96, pulse 82, temperature 98 F (36.7 C), temperature  source Oral, resp. rate 20, height 5' 3 (1.6 m), weight 87 kg, SpO2 98%. Physical Exam  General: No apparent distress HEENT: Head is normocephalic, atraumatic, sclera anicteric, oral mucosa dry Neck: Supple without JVD or lymphadenopathy Heart: Reg rate and rhythm. No murmurs rubs or gallops Chest: CTA bilaterally without wheezes, rales, or rhonchi; no distress Abdomen: Soft, non-tender, non-distended, bowel sounds positive. Extremities: No clubbing, cyanosis, or edema. Pulses are 2+ Psych: flat Skin: Clean and intact without signs of breakdown Neuro:    Mental Status: AAOx4, right gaze preference but able to cross midline Speech/Languate: Naming and repetition intact, fluent, follows simple commands CRANIAL NERVES: II: PERRL.  Left homonymous hemianopsia III, IV, VI: EOM intact, no gaze preference or deviation V: normal sensation bilaterally VII: Left facial droop VIII: normal hearing to speech IX, X: normal palatal elevation XI: 5/5 head turn  XII: Tongue midline   MOTOR: RUE: 4+/5 Deltoid, 4+/5 Biceps, 4+/5 Triceps,4+/5 Grip LUE: 2/5 Deltoid, 4-/5 Biceps, 4-/5 Triceps, 4-/5 Grip RLE: HF 4/5, KE 4+/5, ADF 4+/5, APF 4+/5 LLE: HF 3/5, KE 1-2/5, ADF 4-/5, APF 4-/5   REFLEXES: No ankle clonus  SENSORY: Normal to touch all 4 extremities  Coordination: finger to nose altered on left side appears in proportion to weakness  MSK: healed TKA incisions, no joint swelling noted   Results for orders placed or performed during the hospital encounter of 01/21/24 (from the past 24 hours)  Glucose, capillary     Status: Abnormal   Collection Time: 01/22/24 11:44 AM  Result Value Ref Range   Glucose-Capillary 177 (H) 70 - 99 mg/dL  Glucose, capillary     Status: Abnormal   Collection Time: 01/22/24  4:43 PM  Result Value Ref Range   Glucose-Capillary 107 (H) 70 - 99 mg/dL  Glucose, capillary     Status: Abnormal   Collection Time: 01/22/24  9:34 PM  Result Value Ref Range    Glucose-Capillary 148 (H) 70 - 99 mg/dL   Comment 1 Notify RN    Comment 2 Document in Chart   Glucose, capillary     Status: Abnormal   Collection Time: 01/23/24  6:24 AM  Result Value Ref Range   Glucose-Capillary 103 (H) 70 - 99 mg/dL   Comment 1 Notify RN    Comment 2 Document in Chart   Basic metabolic panel with GFR     Status: Abnormal   Collection Time: 01/23/24  7:39 AM  Result Value Ref Range   Sodium 142 135 - 145 mmol/L  Potassium 4.1 3.5 - 5.1 mmol/L   Chloride 103 98 - 111 mmol/L   CO2 21 (L) 22 - 32 mmol/L   Glucose, Bld 121 (H) 70 - 99 mg/dL   BUN 29 (H) 8 - 23 mg/dL   Creatinine, Ser 8.54 (H) 0.44 - 1.00 mg/dL   Calcium  9.4 8.9 - 10.3 mg/dL   GFR, Estimated 38 (L) >60 mL/min   Anion gap 18 (H) 5 - 15   MR BRAIN WO CONTRAST Result Date: 01/21/2024 CLINICAL DATA:  Stroke, follow up. EXAM: MRI HEAD WITHOUT CONTRAST TECHNIQUE: Multiplanar, multiecho pulse sequences of the brain and surrounding structures were obtained without intravenous contrast. COMPARISON:  Head CT, CTA, and CTP 01/21/2024 FINDINGS: The study is intermittently moderately motion degraded. Brain: There is a moderately large acute right PCA infarct involving much of the occipital lobe and portions of the medial right temporal lobe and right thalamus. There is associated cytotoxic edema without midline shift or other significant mass effect. Chronic microhemorrhages are noted in the right frontal and likely right parietal lobes. T2 hyperintensities elsewhere in the cerebral white matter bilaterally are nonspecific but compatible with mild chronic small vessel ischemic disease. Moderate chronic small vessel changes are present in the pons. No mass or extra-axial fluid collection is evident. A partially empty sella is noted. Mild cerebral atrophy is within normal limits for age. Vascular: More fully evaluated on today's earlier CTA. Skull and upper cervical spine: Unremarkable bone marrow signal. Sinuses/Orbits:  Bilateral cataract extraction. Trace right mastoid fluid. Minimal left ethmoid sinus mucosal thickening. Other: None. IMPRESSION: 1. Moderately large acute right PCA infarct. 2. Mild chronic small vessel ischemic disease. Electronically Signed   By: Dasie Hamburg M.D.   On: 01/21/2024 16:58   ECHOCARDIOGRAM COMPLETE Result Date: 01/21/2024    ECHOCARDIOGRAM REPORT   Patient Name:   JAYDYN MENON Date of Exam: 01/21/2024 Medical Rec #:  [id number]     Height:       63.0 in Accession #:    [id number]    Weight:       191.8 lb Date of Birth:  1950/11/16    BSA:          1.900 m Patient Age:    72 years      BP:           183/97 mmHg Patient Gender: F             HR:           74 bpm. Exam Location:  Inpatient Procedure: 2D Echo, Cardiac Doppler, Color Doppler and Saline Contrast Bubble            Study (Both Spectral and Color Flow Doppler were utilized during            procedure). Indications:    Stroke  History:        Patient has prior history of Echocardiogram examinations, most                 recent 08/25/2021. Abnormal ECG, Stroke, Arrythmias:Atrial                 Fibrillation, Signs/Symptoms:Chest Pain; Risk                 Factors:Hypertension, Diabetes and Dyslipidemia.  Sonographer:    Ellouise Mose RDCS Referring Phys: [id number] JEFFREY C HATCHER IMPRESSIONS  1. Left ventricular ejection fraction, by estimation, is 60 to 65%. The left ventricle has normal function. The left ventricle  has no regional wall motion abnormalities. There is mild left ventricular hypertrophy. Left ventricular diastolic parameters are indeterminate.  2. Right ventricular systolic function is normal. The right ventricular size is normal. There is moderately elevated pulmonary artery systolic pressure. The estimated right ventricular systolic pressure is 56.2 mmHg.  3. The mitral valve is normal in structure. Trivial mitral valve regurgitation. No evidence of mitral stenosis.  4. Tricuspid valve regurgitation is moderate.  5. The aortic  valve is tricuspid. Aortic valve regurgitation is not visualized. No aortic stenosis is present.  6. Agitated saline contrast bubble study was negative, with no evidence of any interatrial shunt. Comparison(s): No significant change from prior study. FINDINGS  Left Ventricle: Left ventricular ejection fraction, by estimation, is 60 to 65%. The left ventricle has normal function. The left ventricle has no regional wall motion abnormalities. The left ventricular internal cavity size was normal in size. There is  mild left ventricular hypertrophy. Left ventricular diastolic parameters are indeterminate. Right Ventricle: The right ventricular size is normal. No increase in right ventricular wall thickness. Right ventricular systolic function is normal. There is moderately elevated pulmonary artery systolic pressure. The tricuspid regurgitant velocity is 3.21 m/s, and with an assumed right atrial pressure of 15 mmHg, the estimated right ventricular systolic pressure is 56.2 mmHg. Left Atrium: Left atrial size was normal in size. Right Atrium: Right atrial size was normal in size. Pericardium: There is no evidence of pericardial effusion. Mitral Valve: The mitral valve is normal in structure. Mild mitral annular calcification. Trivial mitral valve regurgitation. No evidence of mitral valve stenosis. Tricuspid Valve: The tricuspid valve is normal in structure. Tricuspid valve regurgitation is moderate . No evidence of tricuspid stenosis. Aortic Valve: The aortic valve is tricuspid. Aortic valve regurgitation is not visualized. No aortic stenosis is present. Pulmonic Valve: The pulmonic valve was normal in structure. Pulmonic valve regurgitation is mild. No evidence of pulmonic stenosis. Aorta: The aortic root and ascending aorta are structurally normal, with no evidence of dilitation. IAS/Shunts: No atrial level shunt detected by color flow Doppler. Agitated saline contrast was given intravenously to evaluate for  intracardiac shunting. Agitated saline contrast bubble study was negative, with no evidence of any interatrial shunt.  LEFT VENTRICLE PLAX 2D LVIDd:         4.10 cm LVIDs:         2.80 cm LV PW:         1.20 cm LV IVS:        1.20 cm LVOT diam:     2.20 cm LV SV:         70 LV SV Index:   37 LVOT Area:     3.80 cm  LV Volumes (MOD) LV vol d, MOD A2C: 74.0 ml LV vol d, MOD A4C: 79.2 ml LV vol s, MOD A2C: 29.8 ml LV vol s, MOD A4C: 34.7 ml LV SV MOD A2C:     44.2 ml LV SV MOD A4C:     79.2 ml LV SV MOD BP:      44.1 ml RIGHT VENTRICLE            IVC RV S prime:     9.36 cm/s  IVC diam: 1.60 cm TAPSE (M-mode): 0.9 cm LEFT ATRIUM             Index        RIGHT ATRIUM           Index LA diam:  4.00 cm 2.11 cm/m   RA Area:     15.70 cm LA Vol (A2C):   50.1 ml 26.37 ml/m  RA Volume:   40.90 ml  21.53 ml/m LA Vol (A4C):   51.3 ml 27.01 ml/m LA Biplane Vol: 51.1 ml 26.90 ml/m  AORTIC VALVE LVOT Vmax:   92.00 cm/s LVOT Vmean:  59.000 cm/s LVOT VTI:    0.184 m  AORTA Ao Root diam: 2.70 cm Ao Asc diam:  3.20 cm MITRAL VALVE               TRICUSPID VALVE MV Area (PHT): 3.31 cm    TR Peak grad:   41.2 mmHg MV Decel Time: 229 msec    TR Vmax:        321.00 cm/s MV E velocity: 84.90 cm/s                            SHUNTS                            Systemic VTI:  0.18 m                            Systemic Diam: 2.20 cm Aditya Sabharwal Electronically signed by Ria Commander Signature Date/Time: 01/21/2024/3:56:26 PM    Final     Assessment/Plan: Diagnosis: Right PCA CVA including right thalamus Does the need for close, 24 hr/day medical supervision in concert with the patient's rehab needs make it unreasonable for this patient to be served in a less intensive setting? Yes Co-Morbidities requiring supervision/potential complications:  - A-fib, hypertension, hyperlipidemia, diabetes mellitus type 2, dysphagia, obesity, chronic low back pain, hypothyroidism, CKD 3B, hyperlipidemia  Due to bladder management,  bowel management, safety, skin/wound care, disease management, medication administration, pain management, and patient education, does the patient require 24 hr/day rehab nursing? Yes Does the patient require coordinated care of a physician, rehab nurse, therapy disciplines of PT/OT to address physical and functional deficits in the context of the above medical diagnosis(es)? Yes Addressing deficits in the following areas: balance, endurance, locomotion, strength, transferring, bowel/bladder control, bathing, dressing, feeding, grooming, toileting, swallowing, and psychosocial support Can the patient actively participate in an intensive therapy program of at least 3 hrs of therapy per day at least 5 days per week? Yes The potential for patient to make measurable gains while on inpatient rehab is excellent Anticipated functional outcomes upon discharge from inpatient rehab are modified independent and supervision  with PT, modified independent and supervision with OT, modified independent with SLP. Estimated rehab length of stay to reach the above functional goals is: 10-14 Anticipated discharge destination: Home Overall Rehab/Functional Prognosis: excellent  POST ACUTE RECOMMENDATIONS: This patient's condition is appropriate for continued rehabilitative care in the following setting: CIR Patient has agreed to participate in recommended program. Yes Note that insurance prior authorization may be required for reimbursement for recommended care.  Comment: I think patient would be a good candidate for CIR, rehab coordinator follow-up.    I have personally performed a face to face diagnostic evaluation of this patient. Additionally, I have examined the patient's medical record including any pertinent labs and radiographic images.    Thanks,  Murray Collier, MD 01/23/2024

## 2024-01-23 NOTE — Progress Notes (Addendum)
 STROKE TEAM PROGRESS NOTE   INTERIM HISTORY/SUBJECTIVE Patient is sitting up comfortably in a chair at bedside.  No new neurologic events overnight.  Neuro exam remains unchanged from yesterday, we discussed adding back her home Eliquis  and aspirin  tomorrow.  She voiced understanding and had no questions at this time.  No other concerns at this time.  OBJECTIVE  CBC    Component Value Date/Time   WBC 4.2 01/22/2024 0915   RBC 5.24 (H) 01/22/2024 0915   HGB 15.0 01/22/2024 0915   HGB 13.3 10/10/2023 1526   HGB 12.0 02/16/2017 0802   HCT 46.6 (H) 01/22/2024 0915   HCT 42.2 10/10/2023 1526   HCT 36.8 02/16/2017 0802   PLT 285 01/22/2024 0915   PLT 253 10/10/2023 1526   MCV 88.9 01/22/2024 0915   MCV 94 10/10/2023 1526   MCV 89.3 02/16/2017 0802   MCH 28.6 01/22/2024 0915   MCHC 32.2 01/22/2024 0915   RDW 15.1 01/22/2024 0915   RDW 14.3 10/10/2023 1526   RDW 15.6 (H) 02/16/2017 0802   LYMPHSABS 0.9 01/21/2024 0902   LYMPHSABS 1.9 02/16/2017 0802   MONOABS 0.5 01/21/2024 0902   MONOABS 0.2 02/16/2017 0802   EOSABS 0.0 01/21/2024 0902   EOSABS 0.1 02/16/2017 0802   EOSABS 0.2 02/05/2006 0932   BASOSABS 0.0 01/21/2024 0902   BASOSABS 0.0 02/16/2017 0802    BMET    Component Value Date/Time   NA 142 01/23/2024 0739   NA 142 10/10/2023 1526   NA 143 02/16/2017 0802   K 4.1 01/23/2024 0739   K 3.5 02/16/2017 0802   CL 103 01/23/2024 0739   CO2 21 (L) 01/23/2024 0739   CO2 26 02/16/2017 0802   GLUCOSE 121 (H) 01/23/2024 0739   GLUCOSE 92 02/16/2017 0802   BUN 29 (H) 01/23/2024 0739   BUN 33 (H) 10/10/2023 1526   BUN 17.2 02/16/2017 0802   CREATININE 1.45 (H) 01/23/2024 0739   CREATININE 1.33 (H) 10/12/2023 0859   CREATININE 0.9 02/16/2017 0802   CALCIUM  9.4 01/23/2024 0739   CALCIUM  9.9 02/16/2017 0802   EGFR 43 (L) 10/12/2023 0859   EGFR 34 (L) 10/10/2023 1526   GFRNONAA 38 (L) 01/23/2024 0739   GFRNONAA 40 (L) 08/08/2023 0813    IMAGING past 24 hours No  results found.  Vitals:   01/22/24 1954 01/23/24 0018 01/23/24 0304 01/23/24 0801  BP: (!) 159/107 (!) 159/109 (!) 168/106 (!) 171/96  Pulse: 99 89 87 82  Resp: 18 18 18 20   Temp: 98.6 F (37 C) (!) 97.5 F (36.4 C) 98 F (36.7 C) 98 F (36.7 C)  TempSrc: Oral Oral Oral Oral  SpO2: 91% 99% 100% 98%  Weight:      Height:       PHYSICAL EXAM General:  Alert, well-nourished, well-developed patient in no acute distress Psych:  Mood and affect appropriate for situation CV: Regular rate and rhythm on monitor Respiratory:  Regular, unlabored respirations on room air  NEURO:  Mental Status: AA&Ox3, patient is able to give clear and coherent history Speech/Language: speech is without dysarthria or aphasia.  Naming, repetition, fluency, and comprehension intact.  Cranial Nerves:  II: PERRL.  Left homonymous hemianopsia  III, IV, VI: EOMI. Right gaze preference, able to cross midline. Eyelids elevate symmetrically.  V: Sensation is intact to light touch and symmetrical to face.  VII: Left-sided facial droop, no upper face involvement  VIII: hearing intact to voice. IX, X: Palate elevates symmetrically. Phonation is normal.  KP:Dynloizm shrug 5/5. XII: tongue is midline without fasciculations. Motor: 5/5 strength to RLE and RUE, strength of LLE 4/5 and LUE 3/5  Tone: is normal and bulk is normal Sensation- Diminished on L side. Extinction absent to light touch to DSS.   Coordination: FTN intact bilaterally, ataxic on left side but not out of proportion to her left-sided weakness.  Gait- deferred  Most Recent NIH 4   ASSESSMENT/PLAN  [name] is a 73 y.o. female with history of  A-fib on Eliquis , diabetes, CKD dyslipidemia, hypertension, and chronic back pain admitted for left-sided weakness, left facial droop and right gaze preference, code stroke.  NIH on Admission 15  Stroke: right PCA large infarct including right thalamus, etiology is likely cardioembolic with AF  despite on Eliquis , versus large vessel disease Code Stroke - CT head with moderately large acute to subacute right PCA infarct, no intracranial hemorrhage, ASPECTS 10.    CTA head & neck showed occlusion of the right ICA at its origin with intracranial reconstitution, and proximal right P2 occlusion CT perfusion 9/42cc with 33 mL penumbra  MRI with moderate to large acute right PCA infarct and mild chronic small vessel disease 2D Echo EF 60-65%, mild LVH LDL 123 HgbA1c 6.5 UDS negative VTE prophylaxis -SCDs Eliquis  (apixaban ) daily prior to admission, now on clopidogrel  75 mg daily, plan to restart home Eliquis  and enteric-coated aspirin  tomorrow Therapy recommendations:  CIR Disposition: Pending  Atrial fibrillation Home Meds: Eliquis  and metoprolol  50 mg daily Continue telemetry monitoring  now on clopidogrel  75 mg daily, plan to restart home Eliquis  tomorrow   Hypertension Home meds: Lotensin  20 mg daily, metoprolol  50 mg daily Outside permissive hypertension window Continue home metoprolol  and resume home Lotensin  Currently stable on high end Long-term BP goal normotensive   Hyperlipidemia Home meds: Rosuvastatin  10 mg and Zetia  10 LDL 123, goal < 70 Increase Crestor  20 mg daily, continue Zetia  10 mg daily Continue statin and Zetia  at discharge   Diabetes type II Controlled Home meds: Semaglutide  2 mg weekly, glimepiride  1 mg daily, Jardiance  25 mg daily HgbA1c 6.5, goal < 7.0 CBGs SSI Recommend close follow-up with PCP for better DM control   Dysphagia Patient has post-stroke dysphagia, SLP following, likely CIR candidate Currently on carb modified diet   Other Stroke Risk Factors Obesity, Body mass index is 33.98 kg/m., BMI >/= 30 associated with increased stroke risk, recommend weight loss, diet and exercise as appropriate  PVD, CTA showed right ICA occlusion, likely chronic Advanced age   Other Active Problems Chronic low back pain -diclofenac  gel as needed,  baseline bilateral lower extremity weakness Hyperthyroidism -continue Tapazole  5 mg daily CKD -creatinine 1.40--1.27--1.32--1.45, primary team following  Hospital day # 2    ATTENDING NOTE: I reviewed above note and agree with the assessment and plan. Pt was seen and examined.   No family is at the bedside.  Patient sitting in chair, RN at bedside.  She still has dense left hemianopia, left sided hemiparesthesia and mild left-sided weakness.  Currently on Plavix .  Plan for Eliquis  and aspirin  EC tomorrow.  PT and OT recommend CIR.  Will follow  For detailed assessment and plan, please refer to above as I have made changes wherever appropriate.   Ary Cummins, MD PhD Stroke Neurology 01/23/2024 3:43 PM    To contact Stroke Continuity provider, please refer to [email or web address]. After hours, contact General Neurology

## 2024-01-23 NOTE — Progress Notes (Signed)
 Inpatient Rehab Coordinator Note:  I met with patient at bedside to discuss CIR recommendations and goals/expectations of CIR stay.  We reviewed 3 hrs/day of therapy, physician follow up, and average length of stay 2 weeks (dependent upon progress) with goals of supervision.  I also spoke to pt's daughter over the phone and provided same information.  Daughter confirms that pt's son, Nikie Cid and his wife are able to provide 24/7 at pt's home.  I reviewed need for insurance auth and I will start this process today.   Reche Lowers, PT, DPT Admissions Coordinator [phone] 01/23/24  1:28 PM

## 2024-01-23 NOTE — PMR Pre-admission (Signed)
 PMR Admission Coordinator Pre-Admission Assessment  Patient: Megan Fischer is an 73 y.o., female MRN: [id number] DOB: 1950-09-09 Height: 5' 3 (160 cm) Weight: 87 kg              Insurance Information HMO: yes    PPO:      PCP:      IPA:      80/20:      OTHER:  PRIMARY: [employer] Dual Complete      Policy#: [id number]      Subscriber: pt CM Name: portal approval       Phone#:      Fax#: [phone] Pre-Cert#: [id number]  auth for CIR from portal approval for admit 9/18 through 9/25.  Updates due at fax listed above.        Employer:  Benefits:  Phone #: [phone]     Name:  Eff. Date: 06/09/23     Deduct: $257 (met)      Out of Pocket Max: [phone] ($855.16 met)      Life Max:   CIR: $1565/admit      SNF: 20 full days Outpatient: 80%     Co-Pay: 20% Home Health: 100%      Co-Pay:  DME: 80%     Co-Pay: 20% Providers:  SECONDARY: Medicaid of [place]      Policy#: [id number]      Phone#: [phone]  Financial Counselor:       Phone#:   The "Data Collection Information Summary" for patients in Inpatient Rehabilitation Facilities with attached "Privacy Act Statement-Health Care Records" was provided and verbally reviewed with: Patient and Family  Emergency Contact Information Contact Information     Name Relation Home Work Mobile   [place] Daughter   [phone]   Bess, Saltzman   [phone]      Other Contacts     Name Relation Home Work Mobile   [name],[name] Other   [phone]      Current Medical History  Patient Admitting Diagnosis: CVA   History of Present Illness: Pt is a 73 y/o female with PMH of afib (on eliquis ), HTN, DM, OA with bilat TKA, R shoulder surgery who presented to [facility] on 01/21/24 after a fall at home with associated L sided weakness and R gaze preference.  In ED pt was hypertensive but otherwise hemodynamically stable.  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 She was not a  candidate for TNK and felt to be a poor candidate for thrombectomy given the established infarct and distal vessel location.  Neurology consulted and stroke workup revealed right acute PCA with chronic small vessel disease on MRI, 60-65% EF on echo with mild LVH, A1C 6.5, and LDL 123.  Recommendations were for Plavix  with transition to home eliquis  and addition of aspirin .  Therapy ongoing and pt was recommended for CIR.   Complete NIHSS TOTAL: 7 Glasgow Coma Scale Score: 15  Patient's medical record from [facility] has been reviewed by the rehabilitation admission coordinator and physician.  Past Medical History  Past Medical History:  Diagnosis Date   Atrial fibrillation (HCC)    Chronic disease anemia 03/29/2011   Diabetes mellitus    Dyslipidemia    Hypertension    Obesities, morbid (HCC)    Osteoarthritis     Has the patient had major surgery during 100 days prior to admission? No  Family History  family history includes Diabetes in her mother; Heart attack in  her mother; Hypertension in her brother; Stroke in her mother.   Current Medications   Current Facility-Administered Medications:    acetaminophen  (TYLENOL ) tablet 650 mg, 650 mg, Oral, Q6H PRN, D'Mello, Rosalyn, DO, 650 mg at 01/24/24 1041   amLODipine  (NORVASC ) tablet 10 mg, 10 mg, Oral, Daily, D'Mello, Rosalyn, DO, 10 mg at 01/24/24 1010   apixaban  (ELIQUIS ) tablet 5 mg, 5 mg, Oral, BID, D'Mello, Rosalyn, DO, 5 mg at 01/24/24 1011   aspirin  EC tablet 81 mg, 81 mg, Oral, Daily, D'Mello, Rosalyn, DO, 81 mg at 01/24/24 1011   benazepril  (LOTENSIN ) tablet 20 mg, 20 mg, Oral, Daily, D'Mello, Rosalyn, DO, 20 mg at 01/24/24 1012   diclofenac  Sodium (VOLTAREN ) 1 % topical gel 2 g, 2 g, Topical, QID, Jolaine Pac, DO, 2 g at 01/24/24 1012   empagliflozin  (JARDIANCE ) tablet 25 mg, 25 mg, Oral, QAC breakfast, D'Mello, Rosalyn, DO, 25 mg at 01/24/24 1011   ezetimibe  (ZETIA ) tablet 10 mg, 10 mg, Oral, Daily, Jolaine Pac,  DO, 10 mg at 01/24/24 1012   insulin  aspart (novoLOG ) injection 0-15 Units, 0-15 Units, Subcutaneous, TID WC, Jolaine Pac, DO, 3 Units at 01/24/24 1254   insulin  aspart (novoLOG ) injection 0-5 Units, 0-5 Units, Subcutaneous, QHS, Jolaine Pac, DO   methimazole  (TAPAZOLE ) tablet 5 mg, 5 mg, Oral, Daily, Jolaine Pac, DO, 5 mg at 01/24/24 1011   metoprolol  succinate (TOPROL -XL) 24 hr tablet 50 mg, 50 mg, Oral, Daily, Jerri Pfeiffer, MD, 50 mg at 01/24/24 1011   rosuvastatin  (CRESTOR ) tablet 20 mg, 20 mg, Oral, Daily, D'Mello, Rosalyn, DO, 20 mg at 01/24/24 1011  Patients Current Diet:  Diet Order             Diet Carb Modified Fluid consistency: Thin; Room service appropriate? Yes  Diet effective now                   Precautions / Restrictions Precautions Precautions: Fall, Other (comment) Precaution/Restrictions Comments: L homonymous hemianopsia Restrictions Weight Bearing Restrictions Per Provider Order: No   Has the patient had 2 or more falls or a fall with injury in the past year?Yes  Prior Activity Level Limited Community (1-2x/wk): independent, occasional RW use, recently more unsteady (last week or 2), doesn't drive but no assist for ADLs/mobility/iADLs  Prior Functional Level Prior Function Prior Level of Function : Independent/Modified Independent Mobility Comments: cane PRN ADLs Comments: does not drive, ind ADLs, IADls (fiances and meds)  Self Care: Did the patient need help bathing, dressing, using the toilet or eating?  Independent  Indoor Mobility: Did the patient need assistance with walking from room to room (with or without device)? Independent  Stairs: Did the patient need assistance with internal or external stairs (with or without device)? Independent  Functional Cognition: Did the patient need help planning regular tasks such as shopping or remembering to take medications? Independent  Patient Information Are you of Hispanic, Latino/a,or  Spanish origin?: A. No, not of Hispanic, Latino/a, or Spanish origin What is your race?: B. Black or African American Do you need or want an interpreter to communicate with a doctor or health care staff?: 0. No  Patient's Response To:  Health Literacy and Transportation Is the patient able to respond to health literacy and transportation needs?: Yes Health Literacy - How often do you need to have someone help you when you read instructions, pamphlets, or other written material from your doctor or pharmacy?: Never In the past 12 months, has lack of transportation kept you  from medical appointments or from getting medications?: No In the past 12 months, has lack of transportation kept you from meetings, work, or from getting things needed for daily living?: No  Journalist, newspaper / Equipment Home Equipment: Grab bars - tub/shower, Medical laboratory scientific officer - single point, Toilet riser  Prior Device Use: Indicate devices/aids used by the patient prior to current illness, exacerbation or injury? Walker  Current Functional Level Cognition  Orientation Level: Oriented X4, Oriented to person, Oriented to place, Oriented to time, Oriented to situation    Extremity Assessment (includes Sensation/Coordination)  Upper Extremity Assessment: LUE deficits/detail, Generalized weakness, Right hand dominant, RUE deficits/detail RUE Deficits / Details: shoulder flexion to ~100* with crepitus noted, overall functional LUE Deficits / Details: grossly 3-/5, decreased propioception and awareness to UE, poor coodination and functional use. LUE Sensation: decreased light touch, decreased proprioception LUE Coordination: decreased fine motor, decreased gross motor  Lower Extremity Assessment: Defer to PT evaluation RLE Deficits / Details: WFL LLE Deficits / Details: Pt reports she has had 12 surgeries, on left lower extremity, tends to rest ankle in increased external rotation. Proximally, strength is 2-/5, distally ankle  dorsiflexion 5/5 LLE Sensation: decreased proprioception (Sensation diminished on the left with extinction on double simultaneous stimulation)    ADLs  Overall ADL's : Needs assistance/impaired Grooming: Moderate assistance, Sitting Upper Body Dressing : Moderate assistance, Sitting Lower Body Dressing: Total assistance, +2 for physical assistance Toilet Transfer: Maximal assistance, +2 for safety/equipment, +2 for physical assistance Toilet Transfer Details (indicate cue type and reason): bil hand held to recliner towards R side Functional mobility during ADLs: Moderate assistance, Maximal assistance, +2 for physical assistance, Cueing for safety, Cueing for sequencing General ADL Comments: pt limited by L weakness, impaired vision and balance.  decreased awareness of deficits, poor initation and problem solvng    Mobility  Overal bed mobility: Needs Assistance Bed Mobility: Supine to Sit Supine to sit: Contact guard General bed mobility comments: Pt exiting R side of bed with improved efficiency in compared to yesterday, cues for bringing R hip forward to edge    Transfers  Overall transfer level: Needs assistance Equipment used: Rolling walker (2 wheels), 2 person hand held assist Transfers: Sit to/from Stand, Bed to chair/wheelchair/BSC Sit to Stand: Max assist, From elevated surface Bed to/from chair/wheelchair/BSC transfer type:: Squat pivot Stand pivot transfers: +2 physical assistance, Mod assist Squat pivot transfers: Max assist General transfer comment: Attempted to stand to RW with +1 assist and pt unable. Pt needs manual placement of feet shoulder width apart, facilitation to bring LUE up to RW and support, pushing off with RUE from chair arm with pt not being able to stand with only 1 person assist. Max assist of 1 to perform squat pivot transfer towards right with use of pad (+2 not available).    Ambulation / Gait / Stairs / Wheelchair Mobility  Ambulation/Gait General  Gait Details: unable    Posture / Balance Dynamic Sitting Balance Sitting balance - Comments: Fair statically and needed min to CGA dynamically with pt performing reaching with cues and assist. Pt has difficulty looking to left without cues.  Family member commented that pts sitting balance was much better today. performed reaching tasks and exercises EOB for up to 15 min Balance Overall balance assessment: Needs assistance Sitting-balance support: Feet supported, No upper extremity supported Sitting balance-Leahy Scale: Fair Sitting balance - Comments: Fair statically and needed min to CGA dynamically with pt performing reaching with cues and assist. Pt has difficulty  looking to left without cues.  Family member commented that pts sitting balance was much better today. performed reaching tasks and exercises EOB for up to 15 min Standing balance support: Bilateral upper extremity supported Standing balance-Leahy Scale: Poor Standing balance comment: Requiring max A    Special considerations/ Life events Diabetic management yes     Previous Home Environment (from acute therapy documentation) Living Arrangements: Alone Available Help at Discharge: Family, Available 24 hours/day Type of Home: Apartment Home Layout: One level Home Access: Level entry Bathroom Shower/Tub: Engineer, manufacturing systems: Standard Home Care Services: No Additional Comments: senior living apt  Discharge Living Setting Plans for Discharge Living Setting: Patient's home, Lives with (comment) (son/DIL to stay with her) Type of Home at Discharge: Apartment Discharge Home Layout: One level Discharge Home Access: Level entry Discharge Bathroom Shower/Tub: Tub/shower unit Discharge Bathroom Toilet: Standard Discharge Bathroom Accessibility: Yes How Accessible: Accessible via walker Does the patient have any problems obtaining your medications?: No  Social/Family/Support Systems Anticipated Caregiver: son,  DIL, and daughter Anticipated Caregiver's Contact Information: Daughter is primary contact:  Carl  [phone] Ability/Limitations of Caregiver: none stated Caregiver Availability: 24/7 Discharge Plan Discussed with Primary Caregiver: Yes Is Caregiver In Agreement with Plan?: Yes   Goals Patient/Family Goal for Rehab: PT/OT supervision to min assist, SLP supervision to mod I Expected length of stay: 15-18 days Additional Information: Discharge plan: return to pt's senior living apartment with 24/7 support from primarily her son/DIL, and her daughter as well Pt/Family Agrees to Admission and willing to participate: Yes Program Orientation Provided & Reviewed with Pt/Caregiver Including Roles  & Responsibilities: Yes   Decrease burden of Care through IP rehab admission: n/a   Possible need for SNF placement upon discharge: Not anticipated.  Plan to discharge back to pt's apartment with her son/DIL and daughter providing 24/7 supervision at discharge.    Patient Condition: This patient's condition remains as documented in the consult dated 01/23/24, in which the Rehabilitation Physician determined and documented that the patient's condition is appropriate for intensive rehabilitative care in an inpatient rehabilitation facility. Will admit to inpatient rehab today.  Preadmission Screen Completed By:  Reche FORBES Lowers, PT, DPT 01/24/2024 3:15 PM ______________________________________________________________________   Discussed status with Dr. Nagee Goates on 01/24/24 at 3:15 PM  and received approval for admission today.  Admission Coordinator:  Caitlin E Warren, time 3:15 PM Pattricia 01/24/24

## 2024-01-23 NOTE — Progress Notes (Addendum)
 Physical Therapy Treatment Patient Details Name: Megan Fischer MRN: [id number] DOB: 08-Oct-1950 Today's Date: 01/23/2024   History of Present Illness Pt is a 73 y/o female presenting on 9/15 with L sided weakness and R gaze preference after falling. 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  PMH includes: afib, HTN, DM2, OA, bil TKR, R shoulder surgery.    PT Comments  Pt making incremental progress towards her physical therapy goals and is agreeable to participate. Demonstrates improved ease with exiting towards right side of bed. Performed stand pivot transfer to chair with handheld assist. Additionally, worked on x 3 sit to stand transfers from chair to RW with LUE support on handle. Pt with decreased LLE proprioception, weakness, visual changes, decreased coordination and impaired awareness. Patient will benefit from intensive inpatient follow-up therapy, >3 hours/day.    If plan is discharge home, recommend the following: Two people to help with walking and/or transfers;Two people to help with bathing/dressing/bathroom   Can travel by private vehicle        Equipment Recommendations  BSC/3in1;Wheelchair (measurements PT);Wheelchair cushion (measurements PT)    Recommendations for Other Services Rehab consult     Precautions / Restrictions Precautions Precautions: Fall;Other (comment) Recall of Precautions/Restrictions: Impaired Precaution/Restrictions Comments: L homonymous hemianopsia Restrictions Weight Bearing Restrictions Per Provider Order: No     Mobility  Bed Mobility Overal bed mobility: Needs Assistance Bed Mobility: Supine to Sit     Supine to sit: Contact guard     General bed mobility comments: Pt exiting R side of bed with improved efficiency in compared to yesterday, cues for bringing R hip forward to edge    Transfers Overall transfer level: Needs assistance Equipment used: Rolling walker (2 wheels), 2 person hand held assist Transfers: Sit to/from Stand,  Bed to chair/wheelchair/BSC Sit to Stand: Mod assist, +2 physical assistance Stand pivot transfers: +2 physical assistance, Mod assist         General transfer comment: ModA + 2 to perform stand pivot transfer towards right with handheld assist. Additionally performed standing to RW with modA + 2, manual placement of feet shoulder width apart, facilitation to bring LUE up to RW and support, pushing off with RUE from chair arm. Unable to hold static stand > 5 s    Ambulation/Gait               General Gait Details: unable   Stairs             Wheelchair Mobility     Tilt Bed    Modified Rankin (Stroke Patients Only) Modified Rankin (Stroke Patients Only) Pre-Morbid Rankin Score: Slight disability Modified Rankin: Severe disability     Balance Overall balance assessment: Needs assistance Sitting-balance support: Feet supported Sitting balance-Leahy Scale: Fair Sitting balance - Comments: Fair statically   Standing balance support: Bilateral upper extremity supported Standing balance-Leahy Scale: Poor Standing balance comment: Requiring modA                            Communication Communication Communication: No apparent difficulties  Cognition Arousal: Alert Behavior During Therapy: WFL for tasks assessed/performed   PT - Cognitive impairments: Awareness, Attention, Sequencing, Problem solving, Safety/Judgement                       PT - Cognition Comments: Pt A&Ox4, decreased insight into deficits Following commands: Impaired Following commands impaired: Follows one step commands with increased time  Cueing Cueing Techniques: Verbal cues, Tactile cues  Exercises General Exercises - Lower Extremity Long Arc Quad: AAROM, AROM, Both, 10 reps, Seated Hip ABduction/ADduction: AROM, Both, 10 reps, Seated    General Comments        Pertinent Vitals/Pain Pain Assessment Pain Assessment: Faces Faces Pain Scale: Hurts a little  bit Pain Location: grimacing with repositioning of LLE Pain Descriptors / Indicators: Grimacing Pain Intervention(s): Monitored during session    Home Living                          Prior Function            PT Goals (current goals can now be found in the care plan section) Acute Rehab PT Goals Patient Stated Goal: go home PT Goal Formulation: With patient/family Time For Goal Achievement: 02/05/24 Potential to Achieve Goals: Fair Progress towards PT goals: Progressing toward goals    Frequency    Min 3X/week      PT Plan      Co-evaluation              AM-PAC PT 6 Clicks Mobility   Outcome Measure  Help needed turning from your back to your side while in a flat bed without using bedrails?: A Little Help needed moving from lying on your back to sitting on the side of a flat bed without using bedrails?: A Little Help needed moving to and from a bed to a chair (including a wheelchair)?: A Lot Help needed standing up from a chair using your arms (e.g., wheelchair or bedside chair)?: A Lot Help needed to walk in hospital room?: Total Help needed climbing 3-5 steps with a railing? : Total 6 Click Score: 12    End of Session Equipment Utilized During Treatment: Gait belt Activity Tolerance: Patient tolerated treatment well Patient left: in chair;with call bell/phone within reach;with chair alarm set Nurse Communication: Mobility status PT Visit Diagnosis: Unsteadiness on feet (R26.81);Difficulty in walking, not elsewhere classified (R26.2);Other symptoms and signs involving the nervous system (R29.898);Hemiplegia and hemiparesis Hemiplegia - Right/Left: Left Hemiplegia - dominant/non-dominant: Non-dominant Hemiplegia - caused by: Cerebral infarction     Time: 8898-8872 PT Time Calculation (min) (ACUTE ONLY): 26 min  Charges:    $Therapeutic Activity: 23-37 mins PT General Charges $$ ACUTE PT VISIT: 1 Visit                     Megan Fischer, PT, DPT Acute Rehabilitation Services Office [phone]    Megan ONEIDA Fischer 01/23/2024, 11:42 AM

## 2024-01-24 ENCOUNTER — Encounter (HOSPITAL_COMMUNITY): Payer: Self-pay | Admitting: Physical Medicine & Rehabilitation

## 2024-01-24 ENCOUNTER — Inpatient Hospital Stay (HOSPITAL_COMMUNITY)
Admission: AD | Admit: 2024-01-24 | Discharge: 2024-02-15 | DRG: 057 | Disposition: A | Discharge status: Home Health | Source: Intra-hospital | Attending: Physical Medicine & Rehabilitation | Admitting: Physical Medicine & Rehabilitation

## 2024-01-24 ENCOUNTER — Other Ambulatory Visit: Payer: Self-pay

## 2024-01-24 DIAGNOSIS — N179 Acute kidney failure, unspecified: Secondary | ICD-10-CM | POA: Diagnosis not present

## 2024-01-24 DIAGNOSIS — I69392 Facial weakness following cerebral infarction: Secondary | ICD-10-CM | POA: Diagnosis not present

## 2024-01-24 DIAGNOSIS — M79672 Pain in left foot: Secondary | ICD-10-CM | POA: Diagnosis present

## 2024-01-24 DIAGNOSIS — W19XXXA Unspecified fall, initial encounter: Secondary | ICD-10-CM | POA: Diagnosis not present

## 2024-01-24 DIAGNOSIS — I779 Disorder of arteries and arterioles, unspecified: Secondary | ICD-10-CM | POA: Diagnosis not present

## 2024-01-24 DIAGNOSIS — M419 Scoliosis, unspecified: Secondary | ICD-10-CM | POA: Diagnosis present

## 2024-01-24 DIAGNOSIS — G8929 Other chronic pain: Secondary | ICD-10-CM | POA: Diagnosis present

## 2024-01-24 DIAGNOSIS — I63531 Cerebral infarction due to unspecified occlusion or stenosis of right posterior cerebral artery: Secondary | ICD-10-CM | POA: Diagnosis not present

## 2024-01-24 DIAGNOSIS — N189 Chronic kidney disease, unspecified: Secondary | ICD-10-CM | POA: Diagnosis present

## 2024-01-24 DIAGNOSIS — E785 Hyperlipidemia, unspecified: Secondary | ICD-10-CM | POA: Diagnosis present

## 2024-01-24 DIAGNOSIS — E118 Type 2 diabetes mellitus with unspecified complications: Secondary | ICD-10-CM | POA: Diagnosis not present

## 2024-01-24 DIAGNOSIS — R296 Repeated falls: Secondary | ICD-10-CM | POA: Diagnosis present

## 2024-01-24 DIAGNOSIS — E1169 Type 2 diabetes mellitus with other specified complication: Secondary | ICD-10-CM | POA: Diagnosis not present

## 2024-01-24 DIAGNOSIS — I4891 Unspecified atrial fibrillation: Secondary | ICD-10-CM | POA: Diagnosis present

## 2024-01-24 DIAGNOSIS — E1122 Type 2 diabetes mellitus with diabetic chronic kidney disease: Secondary | ICD-10-CM | POA: Diagnosis present

## 2024-01-24 DIAGNOSIS — Z823 Family history of stroke: Secondary | ICD-10-CM | POA: Diagnosis not present

## 2024-01-24 DIAGNOSIS — Z7984 Long term (current) use of oral hypoglycemic drugs: Secondary | ICD-10-CM

## 2024-01-24 DIAGNOSIS — E0590 Thyrotoxicosis, unspecified without thyrotoxic crisis or storm: Secondary | ICD-10-CM | POA: Diagnosis present

## 2024-01-24 DIAGNOSIS — I6931 Attention and concentration deficit following cerebral infarction: Secondary | ICD-10-CM

## 2024-01-24 DIAGNOSIS — Z96653 Presence of artificial knee joint, bilateral: Secondary | ICD-10-CM | POA: Diagnosis present

## 2024-01-24 DIAGNOSIS — I10 Essential (primary) hypertension: Secondary | ICD-10-CM | POA: Diagnosis not present

## 2024-01-24 DIAGNOSIS — E1129 Type 2 diabetes mellitus with other diabetic kidney complication: Secondary | ICD-10-CM | POA: Diagnosis present

## 2024-01-24 DIAGNOSIS — Z8249 Family history of ischemic heart disease and other diseases of the circulatory system: Secondary | ICD-10-CM

## 2024-01-24 DIAGNOSIS — K5900 Constipation, unspecified: Secondary | ICD-10-CM | POA: Diagnosis present

## 2024-01-24 DIAGNOSIS — Z833 Family history of diabetes mellitus: Secondary | ICD-10-CM | POA: Diagnosis not present

## 2024-01-24 DIAGNOSIS — Z79899 Other long term (current) drug therapy: Secondary | ICD-10-CM

## 2024-01-24 DIAGNOSIS — I69354 Hemiplegia and hemiparesis following cerebral infarction affecting left non-dominant side: Principal | ICD-10-CM

## 2024-01-24 DIAGNOSIS — R29705 NIHSS score 5: Secondary | ICD-10-CM

## 2024-01-24 DIAGNOSIS — I129 Hypertensive chronic kidney disease with stage 1 through stage 4 chronic kidney disease, or unspecified chronic kidney disease: Secondary | ICD-10-CM | POA: Diagnosis present

## 2024-01-24 DIAGNOSIS — D631 Anemia in chronic kidney disease: Secondary | ICD-10-CM | POA: Diagnosis present

## 2024-01-24 DIAGNOSIS — I63511 Cerebral infarction due to unspecified occlusion or stenosis of right middle cerebral artery: Secondary | ICD-10-CM | POA: Diagnosis present

## 2024-01-24 DIAGNOSIS — M792 Neuralgia and neuritis, unspecified: Secondary | ICD-10-CM | POA: Diagnosis not present

## 2024-01-24 DIAGNOSIS — Z888 Allergy status to other drugs, medicaments and biological substances status: Secondary | ICD-10-CM

## 2024-01-24 DIAGNOSIS — M1711 Unilateral primary osteoarthritis, right knee: Secondary | ICD-10-CM | POA: Diagnosis present

## 2024-01-24 DIAGNOSIS — Z6834 Body mass index (BMI) 34.0-34.9, adult: Secondary | ICD-10-CM

## 2024-01-24 DIAGNOSIS — M545 Low back pain, unspecified: Secondary | ICD-10-CM | POA: Diagnosis present

## 2024-01-24 DIAGNOSIS — Z7901 Long term (current) use of anticoagulants: Secondary | ICD-10-CM

## 2024-01-24 DIAGNOSIS — Z880 Allergy status to penicillin: Secondary | ICD-10-CM

## 2024-01-24 DIAGNOSIS — N1832 Chronic kidney disease, stage 3b: Secondary | ICD-10-CM | POA: Diagnosis not present

## 2024-01-24 DIAGNOSIS — M25512 Pain in left shoulder: Secondary | ICD-10-CM | POA: Insufficient documentation

## 2024-01-24 DIAGNOSIS — N183 Chronic kidney disease, stage 3 unspecified: Secondary | ICD-10-CM

## 2024-01-24 DIAGNOSIS — I63431 Cerebral infarction due to embolism of right posterior cerebral artery: Secondary | ICD-10-CM | POA: Diagnosis not present

## 2024-01-24 DIAGNOSIS — M25511 Pain in right shoulder: Secondary | ICD-10-CM | POA: Diagnosis present

## 2024-01-24 DIAGNOSIS — Z885 Allergy status to narcotic agent status: Secondary | ICD-10-CM

## 2024-01-24 LAB — GLUCOSE, CAPILLARY
Glucose-Capillary: 120 mg/dL — ABNORMAL HIGH (ref 70–99)
Glucose-Capillary: 196 mg/dL — ABNORMAL HIGH (ref 70–99)
Glucose-Capillary: 92 mg/dL (ref 70–99)
Glucose-Capillary: 136 mg/dL — ABNORMAL HIGH (ref 70–99)

## 2024-01-24 MED ORDER — PROCHLORPERAZINE EDISYLATE 10 MG/2ML IJ SOLN: 5.0000 mg | Freq: Four times a day (QID) | INTRAMUSCULAR | Status: DC | PRN

## 2024-01-24 MED ORDER — METHIMAZOLE 5 MG PO TABS
5.0000 mg | ORAL_TABLET | Freq: Every day | ORAL | Status: DC
  Administered 2024-01-25 – 2024-02-15 (×22): 5 mg via ORAL
  Filled 2024-01-24 (×22): qty 1

## 2024-01-24 MED ORDER — METOPROLOL SUCCINATE ER 50 MG PO TB24
50.0000 mg | ORAL_TABLET | Freq: Every day | ORAL | Status: DC
Start: 2024-01-25 — End: 2024-02-02
  Administered 2024-01-25 – 2024-02-02 (×9): 50 mg via ORAL
  Filled 2024-01-24 (×10): qty 1

## 2024-01-24 MED ORDER — PROCHLORPERAZINE MALEATE 5 MG PO TABS: 5.0000 mg | ORAL_TABLET | Freq: Four times a day (QID) | ORAL | Status: DC | PRN

## 2024-01-24 MED ORDER — PROCHLORPERAZINE 25 MG RE SUPP: 12.5000 mg | Freq: Four times a day (QID) | RECTAL | Status: DC | PRN

## 2024-01-24 MED ORDER — APIXABAN 5 MG PO TABS
5.0000 mg | ORAL_TABLET | Freq: Two times a day (BID) | ORAL | Status: DC
  Administered 2024-01-24 – 2024-02-15 (×44): 5 mg via ORAL
  Filled 2024-01-24 (×44): qty 1

## 2024-01-24 MED ORDER — DICLOFENAC SODIUM 1 % EX GEL
2.0000 g | Freq: Four times a day (QID) | CUTANEOUS | Status: DC
Start: 2024-01-24 — End: 2024-01-25
  Filled 2024-01-24: qty 100

## 2024-01-24 MED ORDER — INFLUENZA VAC SPLIT HIGH-DOSE 0.5 ML IM SUSY
0.5000 mL | PREFILLED_SYRINGE | INTRAMUSCULAR | Status: DC
  Filled 2024-01-24: qty 0.5

## 2024-01-24 MED ORDER — AMLODIPINE BESYLATE 10 MG PO TABS
10.0000 mg | ORAL_TABLET | Freq: Every day | ORAL | Status: DC
  Administered 2024-01-25 – 2024-02-11 (×18): 10 mg via ORAL
  Filled 2024-01-24 (×19): qty 1

## 2024-01-24 MED ORDER — ACETAMINOPHEN 325 MG PO TABS
325.0000 mg | ORAL_TABLET | ORAL | Status: DC | PRN
  Administered 2024-01-24 – 2024-02-12 (×13): 650 mg via ORAL
  Filled 2024-01-24 (×15): qty 2

## 2024-01-24 MED ORDER — EMPAGLIFLOZIN 25 MG PO TABS
25.0000 mg | ORAL_TABLET | Freq: Every day | ORAL | Status: DC
Start: 2024-01-25 — End: 2024-01-31
  Administered 2024-01-25 – 2024-01-31 (×7): 25 mg via ORAL
  Filled 2024-01-24 (×8): qty 1

## 2024-01-24 MED ORDER — DICLOFENAC SODIUM 1 % EX GEL
2.0000 g | Freq: Four times a day (QID) | CUTANEOUS | Status: DC
  Administered 2024-01-26 – 2024-02-13 (×34): 2 g via TOPICAL
  Filled 2024-01-24 (×2): qty 100

## 2024-01-24 MED ORDER — ASPIRIN 81 MG PO TBEC: 81.0000 mg | DELAYED_RELEASE_TABLET | Freq: Every day | ORAL | Status: DC

## 2024-01-24 MED ORDER — DIPHENHYDRAMINE HCL 25 MG PO CAPS
25.0000 mg | ORAL_CAPSULE | Freq: Four times a day (QID) | ORAL | Status: DC | PRN
  Administered 2024-01-28 – 2024-02-09 (×4): 25 mg via ORAL
  Filled 2024-01-24 (×4): qty 1

## 2024-01-24 MED ORDER — INSULIN ASPART 100 UNIT/ML IJ SOLN: 0.0000 [IU] | Freq: Every day | INTRAMUSCULAR | Status: DC

## 2024-01-24 MED ORDER — FLEET ENEMA RE ENEM: 1.0000 | ENEMA | Freq: Once | RECTAL | Status: DC | PRN

## 2024-01-24 MED ORDER — AMLODIPINE BESYLATE 10 MG PO TABS: 10.0000 mg | ORAL_TABLET | Freq: Every day | ORAL | Status: DC

## 2024-01-24 MED ORDER — GUAIFENESIN-DM 100-10 MG/5ML PO SYRP: 5.0000 mL | ORAL_SOLUTION | Freq: Four times a day (QID) | ORAL | Status: DC | PRN

## 2024-01-24 MED ORDER — BENAZEPRIL HCL 20 MG PO TABS
20.0000 mg | ORAL_TABLET | Freq: Every day | ORAL | Status: DC
Start: 2024-01-25 — End: 2024-02-15
  Administered 2024-01-25 – 2024-02-15 (×22): 20 mg via ORAL
  Filled 2024-01-24 (×22): qty 1

## 2024-01-24 MED ORDER — PNEUMOCOCCAL 20-VAL CONJ VACC 0.5 ML IM SUSY
0.5000 mL | PREFILLED_SYRINGE | INTRAMUSCULAR | Status: DC
  Filled 2024-01-24: qty 0.5

## 2024-01-24 MED ORDER — ROSUVASTATIN CALCIUM 20 MG PO TABS: 20.0000 mg | ORAL_TABLET | Freq: Every day | ORAL | Status: DC

## 2024-01-24 MED ORDER — INSULIN ASPART 100 UNIT/ML IJ SOLN
0.0000 [IU] | Freq: Three times a day (TID) | INTRAMUSCULAR | Status: DC
  Administered 2024-01-25: 1 [IU] via SUBCUTANEOUS
  Administered 2024-01-25: 3 [IU] via SUBCUTANEOUS
  Administered 2024-01-25 – 2024-01-26 (×2): 1 [IU] via SUBCUTANEOUS
  Administered 2024-01-26 (×2): 2 [IU] via SUBCUTANEOUS
  Administered 2024-01-27: 1 [IU] via SUBCUTANEOUS
  Administered 2024-01-27: 2 [IU] via SUBCUTANEOUS
  Administered 2024-01-28 (×2): 1 [IU] via SUBCUTANEOUS
  Administered 2024-01-28 – 2024-01-29 (×2): 2 [IU] via SUBCUTANEOUS
  Administered 2024-01-29: 1 [IU] via SUBCUTANEOUS

## 2024-01-24 MED ORDER — BISACODYL 10 MG RE SUPP: 10.0000 mg | Freq: Every day | RECTAL | Status: DC | PRN

## 2024-01-24 MED ORDER — EZETIMIBE 10 MG PO TABS
10.0000 mg | ORAL_TABLET | Freq: Every day | ORAL | Status: DC
  Administered 2024-01-25 – 2024-02-15 (×22): 10 mg via ORAL
  Filled 2024-01-24 (×23): qty 1

## 2024-01-24 MED ORDER — FAMOTIDINE 20 MG PO TABS
20.0000 mg | ORAL_TABLET | Freq: Every day | ORAL | Status: DC
  Administered 2024-01-24 – 2024-02-15 (×23): 20 mg via ORAL
  Filled 2024-01-24 (×24): qty 1

## 2024-01-24 MED ORDER — ALUM & MAG HYDROXIDE-SIMETH 200-200-20 MG/5ML PO SUSP: 30.0000 mL | ORAL | Status: DC | PRN

## 2024-01-24 MED ORDER — ROSUVASTATIN CALCIUM 20 MG PO TABS
20.0000 mg | ORAL_TABLET | Freq: Every day | ORAL | Status: DC
  Administered 2024-01-25 – 2024-02-14 (×21): 20 mg via ORAL
  Filled 2024-01-24 (×22): qty 1

## 2024-01-24 MED ORDER — ASPIRIN 81 MG PO TBEC
81.0000 mg | DELAYED_RELEASE_TABLET | Freq: Every day | ORAL | Status: DC
  Administered 2024-01-25 – 2024-02-15 (×22): 81 mg via ORAL
  Filled 2024-01-24 (×23): qty 1

## 2024-01-24 MED ORDER — CLONIDINE HCL 0.1 MG PO TABS: 0.1000 mg | ORAL_TABLET | Freq: Four times a day (QID) | ORAL | Status: DC | PRN

## 2024-01-24 NOTE — Care Management Important Message (Signed)
 Important Message  Patient Details  Name: Megan Fischer MRN: [id number] Date of Birth: [birth year]   Important Message Given:        [name] 01/24/2024, 4:32 PM

## 2024-01-24 NOTE — Progress Notes (Signed)
 HD#3 SUBJECTIVE:  Patient Summary: Megan Fischer is a 73 y.o. with a pertinent PMH of A-fib on Eliquis , hypertension, hypothyroidism, CKD stage IIIb and type 2 diabetes, who presented with left-sided weakness and right gaze preference and admitted for acute right PCA infarct and management.   Overnight Events: No overnight events  Interim History: Patient is sitting up in bed this morning.  He says that she does not think the food tastes very good.  Did tell her that her cholesterol was on the higher side we increased dosages of her medications.  Patient was seen that she is ready to get back to her daily life and to discuss with her inpatient rehab and that she may be here for couple weeks and will need to continue physical therapy in the outpatient side.  Patient is agreeable to plan OBJECTIVE:  Vital Signs: Vitals:   01/23/24 2200 01/23/24 2336 01/24/24 0356 01/24/24 0814  BP: (!) 146/92 (!) 157/93 (!) 140/89 (!) 153/92  Pulse: 89 80 78 81  Resp: 18 18 18 19   Temp: 98.3 F (36.8 C) 97.6 F (36.4 C) 98 F (36.7 C) (!) 97.5 F (36.4 C)  TempSrc:  Oral  Oral  SpO2: 100% 100% 100% 96%  Weight:      Height:       Supplemental O2: Room Air SpO2: 96 %  Filed Weights   01/21/24 0700 01/22/24 1300  Weight: 87 kg 87 kg     Intake/Output Summary (Last 24 hours) at 01/24/2024 1017 Last data filed at 01/24/2024 0400 Gross per 24 hour  Intake 320 ml  Output 1700 ml  Net -1380 ml   Net IO Since Admission: -1,180 mL [01/24/24 1017]  Physical Exam: Physical Exam Constitutional:      General: She is not in acute distress. Cardiovascular:     Rate and Rhythm: Normal rate. Rhythm irregular.     Heart sounds: No murmur heard. Pulmonary:     Effort: Pulmonary effort is normal. No respiratory distress.  Neurological:     Mental Status: She is alert.     Comments: Alert and oriented Left Homonymous hemianopia  Had 4+ out of 5 grip strength on the left 5 out of 5 on the right      Patient Lines/Drains/Airways Status     Active Line/Drains/Airways     Name Placement date Placement time Site Days   Peripheral IV 01/21/24 20 G Right Antecubital 01/21/24  --  Antecubital  1            Pertinent labs and imaging:      Latest Ref Rng & Units 01/22/2024    9:15 AM 01/21/2024    9:02 AM 01/21/2024    7:27 AM  CBC  WBC 4.0 - 10.5 K/uL 4.2  8.2    Hemoglobin 12.0 - 15.0 g/dL 84.9  86.7  86.0   Hematocrit 36.0 - 46.0 % 46.6  41.9  41.0   Platelets 150 - 400 K/uL 285  224         Latest Ref Rng & Units 01/23/2024    7:39 AM 01/22/2024    9:15 AM 01/21/2024    9:02 AM  CMP  Glucose 70 - 99 mg/dL 878  874  863   BUN 8 - 23 mg/dL 29  25  25    Creatinine 0.44 - 1.00 mg/dL 8.54  8.67  8.72   Sodium 135 - 145 mmol/L 142  142  141   Potassium 3.5 -  5.1 mmol/L 4.1  4.2  4.0   Chloride 98 - 111 mmol/L 103  101  102   CO2 22 - 32 mmol/L 21  24  26    Calcium  8.9 - 10.3 mg/dL 9.4  89.7  9.7   Total Protein 6.5 - 8.1 g/dL   8.0   Total Bilirubin 0.0 - 1.2 mg/dL   0.8   Alkaline Phos 38 - 126 U/L   95   AST 15 - 41 U/L   31   ALT 0 - 44 U/L   21     No results found.   ASSESSMENT/PLAN:  Assessment: Principal Problem:   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 with a pertinent PMH of A-fib on Eliquis , hypertension, hypothyroidism, CKD stage IIIb and type 2 diabetes, who presented with left-sided weakness and right gaze preference and admitted for acute right PCA infarct and management.   Plan: # Acute right PCA stroke CIR believes the patient would be good candidate.  Awaiting insurance authorization.  Deficits unchanged patient has left homonymous hemianopsia.  -Stroke team on board - A1c is 6.5, will  of less than 7  -Lipid panel shows cholesterol of 232 and LDL of 123.  LDL goal is less than 70 - Started Eliquis  and aspirin  81 mg today - Continue to work with PT OT.   - Blood pressure goal is normotensive  #Hyperlipidemia Patient's LDL is 123.   -Continue rosuvastatin  20 mg - Continue ezetimibe  10 mg   # Atrial fibrillation Patient is rate controlled.  Restarted metoprolol  which is patient's home medication.  Rates have been well-controlled.  Eliquis  restarted today  # Hypertension Home meds include benazepril  20 mg and metoprolol  succinate 50 mg. Her goal blood pressure is normotensive at this time.  Amlodipine  10 mg yesterday.  Will need 36 hours for amlodipine  to start taking effect.  Will continue to monitor blood pressure and adjust as needed  # Type 2 diabetes A1c is 6.5.  Goal of less than 7.   -Continue SGLT2 inhibitor - Moderate SSI 3 times daily with meals and nightly while admitted  #CKD stage IIIb  Creatinine has been fluctuating since patient came into the hospital.  Stable at this time.  Trying to avoid nephrotoxic medications  Best Practice: Diet: carb modified IVF:  VTE: SCDs Start: 01/21/24 1044 Eliquis  Code: DNR  Disposition planning: Therapy Recs: CIR, pending authorization Family Contact: Updated at bedside DISPO: Anticipated discharge pending approval for placement and further stroke workup  Signature:  Kylene Zamarron D'Mello [facility]  10:17 AM, 01/24/2024  On Call pager [phone]

## 2024-01-24 NOTE — Progress Notes (Addendum)
 STROKE TEAM PROGRESS NOTE   INTERIM HISTORY/SUBJECTIVE Patient is lying down in hospital bed comfortably with her son at bedside.  No new neurologic events overnight, her neuroexam remains unchanged.  We are restarting her home Eliquis  and adding EC aspirin  today.  No other concerns at this time.  OBJECTIVE  CBC    Component Value Date/Time   WBC 4.2 01/22/2024 0915   RBC 5.24 (H) 01/22/2024 0915   HGB 15.0 01/22/2024 0915   HGB 13.3 10/10/2023 1526   HGB 12.0 02/16/2017 0802   HCT 46.6 (H) 01/22/2024 0915   HCT 42.2 10/10/2023 1526   HCT 36.8 02/16/2017 0802   PLT 285 01/22/2024 0915   PLT 253 10/10/2023 1526   MCV 88.9 01/22/2024 0915   MCV 94 10/10/2023 1526   MCV 89.3 02/16/2017 0802   MCH 28.6 01/22/2024 0915   MCHC 32.2 01/22/2024 0915   RDW 15.1 01/22/2024 0915   RDW 14.3 10/10/2023 1526   RDW 15.6 (H) 02/16/2017 0802   LYMPHSABS 0.9 01/21/2024 0902   LYMPHSABS 1.9 02/16/2017 0802   MONOABS 0.5 01/21/2024 0902   MONOABS 0.2 02/16/2017 0802   EOSABS 0.0 01/21/2024 0902   EOSABS 0.1 02/16/2017 0802   EOSABS 0.2 02/05/2006 0932   BASOSABS 0.0 01/21/2024 0902   BASOSABS 0.0 02/16/2017 0802    BMET    Component Value Date/Time   NA 142 01/23/2024 0739   NA 142 10/10/2023 1526   NA 143 02/16/2017 0802   K 4.1 01/23/2024 0739   K 3.5 02/16/2017 0802   CL 103 01/23/2024 0739   CO2 21 (L) 01/23/2024 0739   CO2 26 02/16/2017 0802   GLUCOSE 121 (H) 01/23/2024 0739   GLUCOSE 92 02/16/2017 0802   BUN 29 (H) 01/23/2024 0739   BUN 33 (H) 10/10/2023 1526   BUN 17.2 02/16/2017 0802   CREATININE 1.45 (H) 01/23/2024 0739   CREATININE 1.33 (H) 10/12/2023 0859   CREATININE 0.9 02/16/2017 0802   CALCIUM  9.4 01/23/2024 0739   CALCIUM  9.9 02/16/2017 0802   EGFR 43 (L) 10/12/2023 0859   EGFR 34 (L) 10/10/2023 1526   GFRNONAA 38 (L) 01/23/2024 0739   GFRNONAA 40 (L) 08/08/2023 0813    IMAGING past 24 hours No results found.  Vitals:   01/23/24 2200 01/23/24 2336  01/24/24 0356 01/24/24 0814  BP: (!) 146/92 (!) 157/93 (!) 140/89 (!) 153/92  Pulse: 89 80 78 81  Resp: 18 18 18 19   Temp: 98.3 F (36.8 C) 97.6 F (36.4 C) 98 F (36.7 C) (!) 97.5 F (36.4 C)  TempSrc:  Oral  Oral  SpO2: 100% 100% 100% 96%  Weight:      Height:       PHYSICAL EXAM General:  Alert, well-nourished, well-developed patient in no acute distress Psych:  Mood and affect appropriate for situation CV: Regular rate and rhythm on monitor Respiratory:  Regular, unlabored respirations on room air  NEURO:  Mental Status: AA&Ox3, patient is able to give clear and coherent history Speech/Language: speech is without dysarthria or aphasia.  Naming, repetition, fluency, and comprehension intact.  Cranial Nerves:  II: PERRL. Left homonymous hemianopsia  III, IV, VI: EOMI. Right gaze preference, able to cross midline. Eyelids elevate symmetrically.  V: Sensation is intact to light touch and symmetrical to face.  VII: L sided facial droop, no upper face involvement VIII: hearing intact to voice. IX, X: Palate elevates symmetrically. Phonation is normal.  KP:Dynloizm shrug 5/5. XII: tongue is midline without fasciculations.  Motor: 5/5 strength to RLE and RUE, 4+/5 LLE and 3+/5 LUE Tone: is normal and bulk is normal Sensation- Diminished on L side Extinction absent to light touch to DSS.   Coordination: FTN intact bilaterally, ataxic on L side but not out of proportion to her weakness. Gait- deferred  Most Recent NIH 5   ASSESSMENT/PLAN  [name] is a 73 y.o. female with history of A-fib on Eliquis , diabetes, CKD dyslipidemia, hypertension, and chronic back pain admitted for left-sided weakness, left facial droop and right gaze preference, code stroke. NIH on Admission 15    Stroke: right PCA large infarct including right thalamus, etiology is likely cardioembolic with AF despite on Eliquis , versus large vessel disease Code Stroke - CT head with moderately large acute  to subacute right PCA infarct, no intracranial hemorrhage, ASPECTS 10.    CTA head & neck showed occlusion of the right ICA at its origin with intracranial reconstitution, and proximal right P2 occlusion CT perfusion 9/42cc with 33 mL penumbra  MRI with moderate to large acute right PCA infarct and mild chronic small vessel disease 2D Echo EF 60-65%, mild LVH LDL 123 HgbA1c 6.5 UDS negative VTE prophylaxis -SCDs Eliquis  (apixaban ) daily prior to admission, restarting home Eliquis  and enteric-coated aspirin  today and continue on discharge Therapy recommendations:  CIR Disposition: Pending  Atrial fibrillation Home Meds: Eliquis  and metoprolol  50 mg daily Continue telemetry monitoring  restart home Eliquis     Hypertension Home meds: Lotensin  20 mg daily, metoprolol  50 mg daily On home metoprolol  and Lotensin  Currently stable on high end Ready normalize BP in 1 to 2 days Long-term BP goal normotensive   Hyperlipidemia Home meds: Rosuvastatin  10 mg and Zetia  10 LDL 123, goal < 70 Increase Crestor  20 mg daily, continue Zetia  10 mg daily Continue statin and Zetia  at discharge   Diabetes type II Controlled Home meds: Semaglutide  2 mg weekly, glimepiride  1 mg daily, Jardiance  25 mg daily HgbA1c 6.5, goal < 7.0 CBGs SSI Recommend close follow-up with PCP for better DM control   Dysphagia Patient has post-stroke dysphagia, SLP following, likely CIR candidate Currently on carb modified diet   Other Stroke Risk Factors Obesity, Body mass index is 33.98 kg/m., BMI >/= 30 associated with increased stroke risk, recommend weight loss, diet and exercise as appropriate  PVD, CTA showed right ICA occlusion, likely chronic Advanced age   Other Active Problems Chronic low back pain -diclofenac  gel as needed, baseline bilateral lower extremity weakness Hyperthyroidism -continue Tapazole  5 mg daily CKD -creatinine 1.40--1.27--1.32--1.45, primary team following  Hospital day #  3  Patient remains stable from neurologic perspective, and will sign off at this time.     ATTENDING NOTE: I reviewed above note and agree with the assessment and plan. Pt was seen and examined.   No family at bedside.  Patient sitting in chair, taking phone calls.  Neurologically intact, no change.  Still has dense left hemianopia.  Will transition from Plavix  to home Eliquis  and adding aspirin  81 on top of Eliquis .  Continue statin and Zetia .  PT and OT recommends CR.  Follow-up with [facility]  For detailed assessment and plan, please refer to above as I have made changes wherever appropriate.   Neurology will sign off. Please call with questions. Pt will follow up with stroke clinic NP at [facility] in about 4-6 weeks. Thanks for the consult.  Ary Cummins, MD PhD Stroke Neurology 01/24/2024 2:11 PM     To contact Stroke Continuity provider,  please refer to [email or web address]. After hours, contact General Neurology

## 2024-01-24 NOTE — Discharge Instructions (Addendum)
 You were hospitalized because you had some weakness on your left side and changes in your vision. We found that you had stroke that was causing you to have these symptoms. We made sure we could rule out any preventable causes of stroke and changed some of your medications to help reduce some of your risk factors for another stroke. Please continue to follow a healthy diet.  Thank you for allowing us  to be part of your care.   Please call the internal medicine clinic to make an appointment for a hospital follow up after you are discharged from inpatient rehab.  Please also follow up with the neurologist  Please note these changes made to your medications:  Amlodipine  10 mg  Aspirin  81 mg Rosuvastatin  20 mg  Please do not take your furosemide ( lasix ) 40 mg until you follow up with your primary care doctor   Please take all other medications as prescribed

## 2024-01-24 NOTE — Progress Notes (Signed)
 Provider contacted. Made aware of patients' blood pressure. Message left and Charge Nurse followed up answering call from Provider. Patient denies any symptoms at this time outside of intermittent headaches and vision change. No headache at this time. Does report blurred vision with left field of vision.

## 2024-01-24 NOTE — Progress Notes (Signed)
 Inpatient Rehab Admissions Coordinator:   Awaiting determination from [facility] Medicare for prior auth request.    Reche Lowers, PT, DPT Admissions Coordinator [phone] 01/24/24  9:45 AM

## 2024-01-24 NOTE — Progress Notes (Signed)
 Cornelio Bouchard, MD  Physician Physical Medicine and Rehabilitation   PMR Pre-admission    Signed   Date of Service: 01/23/2024  1:34 PM  Related encounter: ED to Hosp-Admission (Current) from 01/21/2024 in [facility]   Signed     Expand All Collapse All  PMR Admission Coordinator Pre-Admission Assessment   Patient: Megan Fischer is an 73 y.o., female MRN: [id number] DOB: 1950-11-17 Height: 5' 3 (160 cm) Weight: 87 kg                                                                                                                                                  Insurance Information HMO: yes    PPO:      PCP:      IPA:      80/20:      OTHER:  PRIMARY: [employer] Dual Complete      Policy#: [id number]      Subscriber: Megan Fischer CM Name: portal approval       Phone#:      Fax#: [phone] Pre-Cert#: [id number]  auth for CIR from portal approval for admit 9/18 through 9/25.  Updates due at fax listed above.        Employer:  Benefits:  Phone #: [phone]     Name:  Eff. Date: 06/09/23     Deduct: $257 (met)      Out of Pocket Max: [phone] ($855.16 met)      Life Max:   CIR: $1565/admit      SNF: 20 full days Outpatient: 80%     Co-Pay: 20% Home Health: 100%      Co-Pay:  DME: 80%     Co-Pay: 20% Providers:  SECONDARY: Medicaid [place]      Policy#: [id number]      Phone#: [phone]   Financial Counselor:       Phone#:    The "Data Collection Information Summary" for patients in Inpatient Rehabilitation Facilities with attached "Privacy Act Statement-Health Care Records" was provided and verbally reviewed with: Patient and Family   Emergency Contact Information Contact Information       Name Relation Home Work Mobile    [name]     [phone]    [name], [name]     [phone]         Other Contacts       Name Relation Home Work Mobile    [name],[name] Other     [phone]         Current Medical History  Patient  Admitting Diagnosis: CVA    History of Present Illness: Megan Fischer is a 73 y/o female with PMH of afib (on eliquis ), HTN, DM, OA with bilat TKA, R shoulder surgery who presented to [facility] on 01/21/24 after a fall at home with associated L sided weakness and  R gaze preference.  In ED Megan Fischer was hypertensive but otherwise hemodynamically stable.  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 She was not a candidate for TNK and felt to be a poor candidate for thrombectomy given the established infarct and distal vessel location.  Neurology consulted and stroke workup revealed right acute PCA with chronic small vessel disease on MRI, 60-65% EF on echo with mild LVH, A1C 6.5, and LDL 123.  Recommendations were for Plavix  with transition to home eliquis  and addition of aspirin .  Therapy ongoing and Megan Fischer was recommended for CIR.    Complete NIHSS TOTAL: 7 Glasgow Coma Scale Score: 15   Patient's medical record from [facility] has been reviewed by the rehabilitation admission coordinator and physician.   Past Medical History      Past Medical History:  Diagnosis Date   Atrial fibrillation (HCC)     Chronic disease anemia 03/29/2011   Diabetes mellitus     Dyslipidemia     Hypertension     Obesities, morbid (HCC)     Osteoarthritis            Has the patient had major surgery during 100 days prior to admission? No   Family History  family history includes Diabetes in her mother; Heart attack in her mother; Hypertension in her brother; Stroke in her mother.     Current Medications   Current Medications    Current Facility-Administered Medications:    acetaminophen  (TYLENOL ) tablet 650 mg, 650 mg, Oral, Q6H PRN, D'Mello, Rosalyn, DO, 650 mg at 01/24/24 1041   amLODipine  (NORVASC ) tablet 10 mg, 10 mg, Oral, Daily, D'Mello, Rosalyn, DO, 10 mg at 01/24/24 1010   apixaban  (ELIQUIS ) tablet 5 mg, 5 mg, Oral, BID, D'Mello, Rosalyn, DO, 5 mg at 01/24/24  1011   aspirin  EC tablet 81 mg, 81 mg, Oral, Daily, D'Mello, Rosalyn, DO, 81 mg at 01/24/24 1011   benazepril  (LOTENSIN ) tablet 20 mg, 20 mg, Oral, Daily, D'Mello, Rosalyn, DO, 20 mg at 01/24/24 1012   diclofenac  Sodium (VOLTAREN ) 1 % topical gel 2 g, 2 g, Topical, QID, Jolaine Pac, DO, 2 g at 01/24/24 1012   empagliflozin  (JARDIANCE ) tablet 25 mg, 25 mg, Oral, QAC breakfast, D'Mello, Rosalyn, DO, 25 mg at 01/24/24 1011   ezetimibe  (ZETIA ) tablet 10 mg, 10 mg, Oral, Daily, Jolaine Pac, DO, 10 mg at 01/24/24 1012   insulin  aspart (novoLOG ) injection 0-15 Units, 0-15 Units, Subcutaneous, TID WC, Goodwin, Joseph, DO, 3 Units at 01/24/24 1254   insulin  aspart (novoLOG ) injection 0-5 Units, 0-5 Units, Subcutaneous, QHS, Jolaine Pac, DO   methimazole  (TAPAZOLE ) tablet 5 mg, 5 mg, Oral, Daily, Jolaine Pac, DO, 5 mg at 01/24/24 1011   metoprolol  succinate (TOPROL -XL) 24 hr tablet 50 mg, 50 mg, Oral, Daily, Jerri Pfeiffer, MD, 50 mg at 01/24/24 1011   rosuvastatin  (CRESTOR ) tablet 20 mg, 20 mg, Oral, Daily, D'Mello, Rosalyn, DO, 20 mg at 01/24/24 1011     Patients Current Diet:  Diet Order                  Diet Carb Modified Fluid consistency: Thin; Room service appropriate? Yes  Diet effective now                         Precautions / Restrictions Precautions Precautions: Fall, Other (comment) Precaution/Restrictions Comments: L homonymous hemianopsia Restrictions Weight Bearing Restrictions Per Provider Order: No  Has the patient had 2 or more falls or a fall with injury in the past year?Yes   Prior Activity Level Limited Community (1-2x/wk): independent, occasional RW use, recently more unsteady (last week or 2), doesn't drive but no assist for ADLs/mobility/iADLs   Prior Functional Level Prior Function Prior Level of Function : Independent/Modified Independent Mobility Comments: cane PRN ADLs Comments: does not drive, ind ADLs, IADls (fiances and meds)   Self  Care: Did the patient need help bathing, dressing, using the toilet or eating?  Independent   Indoor Mobility: Did the patient need assistance with walking from room to room (with or without device)? Independent   Stairs: Did the patient need assistance with internal or external stairs (with or without device)? Independent   Functional Cognition: Did the patient need help planning regular tasks such as shopping or remembering to take medications? Independent   Patient Information Are you of Hispanic, Latino/a,or Spanish origin?: A. No, not of Hispanic, Latino/a, or Spanish origin What is your race?: B. Black or African American Do you need or want an interpreter to communicate with a doctor or health care staff?: 0. No   Patient's Response To:  Health Literacy and Transportation Is the patient able to respond to health literacy and transportation needs?: Yes Health Literacy - How often do you need to have someone help you when you read instructions, pamphlets, or other written material from your doctor or pharmacy?: Never In the past 12 months, has lack of transportation kept you from medical appointments or from getting medications?: No In the past 12 months, has lack of transportation kept you from meetings, work, or from getting things needed for daily living?: No   Journalist, newspaper / Equipment Home Equipment: Grab bars - tub/shower, Medical laboratory scientific officer - single point, Toilet riser   Prior Device Use: Indicate devices/aids used by the patient prior to current illness, exacerbation or injury? Walker   Current Functional Level Cognition   Orientation Level: Oriented X4, Oriented to person, Oriented to place, Oriented to time, Oriented to situation    Extremity Assessment (includes Sensation/Coordination)   Upper Extremity Assessment: LUE deficits/detail, Generalized weakness, Right hand dominant, RUE deficits/detail RUE Deficits / Details: shoulder flexion to ~100* with crepitus noted,  overall functional LUE Deficits / Details: grossly 3-/5, decreased propioception and awareness to UE, poor coodination and functional use. LUE Sensation: decreased light touch, decreased proprioception LUE Coordination: decreased fine motor, decreased gross motor  Lower Extremity Assessment: Defer to Megan Fischer evaluation RLE Deficits / Details: WFL LLE Deficits / Details: Megan Fischer reports she has had 12 surgeries, on left lower extremity, tends to rest ankle in increased external rotation. Proximally, strength is 2-/5, distally ankle dorsiflexion 5/5 LLE Sensation: decreased proprioception (Sensation diminished on the left with extinction on double simultaneous stimulation)     ADLs   Overall ADL's : Needs assistance/impaired Grooming: Moderate assistance, Sitting Upper Body Dressing : Moderate assistance, Sitting Lower Body Dressing: Total assistance, +2 for physical assistance Toilet Transfer: Maximal assistance, +2 for safety/equipment, +2 for physical assistance Toilet Transfer Details (indicate cue type and reason): bil hand held to recliner towards R side Functional mobility during ADLs: Moderate assistance, Maximal assistance, +2 for physical assistance, Cueing for safety, Cueing for sequencing General ADL Comments: Megan Fischer limited by L weakness, impaired vision and balance.  decreased awareness of deficits, poor initation and problem solvng     Mobility   Overal bed mobility: Needs Assistance Bed Mobility: Supine to Sit Supine to sit: Contact guard General  bed mobility comments: Megan Fischer exiting R side of bed with improved efficiency in compared to yesterday, cues for bringing R hip forward to edge     Transfers   Overall transfer level: Needs assistance Equipment used: Rolling walker (2 wheels), 2 person hand held assist Transfers: Sit to/from Stand, Bed to chair/wheelchair/BSC Sit to Stand: Max assist, From elevated surface Bed to/from chair/wheelchair/BSC transfer type:: Squat pivot Stand pivot  transfers: +2 physical assistance, Mod assist Squat pivot transfers: Max assist General transfer comment: Attempted to stand to RW with +1 assist and Megan Fischer unable. Megan Fischer needs manual placement of feet shoulder width apart, facilitation to bring LUE up to RW and support, pushing off with RUE from chair arm with Megan Fischer not being able to stand with only 1 person assist. Max assist of 1 to perform squat pivot transfer towards right with use of pad (+2 not available).     Ambulation / Gait / Stairs / Wheelchair Mobility   Ambulation/Gait General Gait Details: unable     Posture / Balance Dynamic Sitting Balance Sitting balance - Comments: Fair statically and needed min to CGA dynamically with Megan Fischer performing reaching with cues and assist. Megan Fischer has difficulty looking to left without cues.  Family member commented that pts sitting balance was much better today. performed reaching tasks and exercises EOB for up to 15 min Balance Overall balance assessment: Needs assistance Sitting-balance support: Feet supported, No upper extremity supported Sitting balance-Leahy Scale: Fair Sitting balance - Comments: Fair statically and needed min to CGA dynamically with Megan Fischer performing reaching with cues and assist. Megan Fischer has difficulty looking to left without cues.  Family member commented that pts sitting balance was much better today. performed reaching tasks and exercises EOB for up to 15 min Standing balance support: Bilateral upper extremity supported Standing balance-Leahy Scale: Poor Standing balance comment: Requiring max A     Special considerations/ Life events Diabetic management yes        Previous Home Environment (from acute therapy documentation) Living Arrangements: Alone Available Help at Discharge: Family, Available 24 hours/day Type of Home: Apartment Home Layout: One level Home Access: Level entry Bathroom Shower/Tub: Engineer, manufacturing systems: Standard Home Care Services: No Additional Comments:  senior living apt   Discharge Living Setting Plans for Discharge Living Setting: Patient's home, Lives with (comment) (son/DIL to stay with her) Type of Home at Discharge: Apartment Discharge Home Layout: One level Discharge Home Access: Level entry Discharge Bathroom Shower/Tub: Tub/shower unit Discharge Bathroom Toilet: Standard Discharge Bathroom Accessibility: Yes How Accessible: Accessible via walker Does the patient have any problems obtaining your medications?: No   Social/Family/Support Systems Anticipated Caregiver: son, DIL, and daughter Anticipated Caregiver's Contact Information: Daughter is primary contact:  Carl  [phone] Ability/Limitations of Caregiver: none stated Caregiver Availability: 24/7 Discharge Plan Discussed with Primary Caregiver: Yes Is Caregiver In Agreement with Plan?: Yes     Goals Patient/Family Goal for Rehab: Megan Fischer/OT supervision to min assist, SLP supervision to mod I Expected length of stay: 15-18 days Additional Information: Discharge plan: return to Megan Fischer's senior living apartment with 24/7 support from primarily her son/DIL, and her daughter as well Megan Fischer/Family Agrees to Admission and willing to participate: Yes Program Orientation Provided & Reviewed with Megan Fischer/Caregiver Including Roles  & Responsibilities: Yes     Decrease burden of Care through IP rehab admission: n/a     Possible need for SNF placement upon discharge: Not anticipated.  Plan to discharge back to Megan Fischer's apartment with her son/DIL and daughter providing  24/7 supervision at discharge.      Patient Condition: This patient's condition remains as documented in the consult dated 01/23/24, in which the Rehabilitation Physician determined and documented that the patient's condition is appropriate for intensive rehabilitative care in an inpatient rehabilitation facility. Will admit to inpatient rehab today.   Preadmission Screen Completed By:  Megan Fischer, Megan Fischer, Megan Fischer 01/24/2024 3:15  PM ______________________________________________________________________   Discussed status with Dr. Lovorn on 01/24/24 at 3:15 PM  and received approval for admission today.   Admission Coordinator:  Megan Fischer E Moriah Loughry, time 3:15 PM Megan Fischer 01/24/24             Revision History

## 2024-01-24 NOTE — Progress Notes (Signed)
 Physical Therapy Treatment Patient Details Name: Megan Fischer MRN: [id number] DOB: 1951/03/24 Today's Date: 01/24/2024   History of Present Illness Pt is a 73 y/o female presenting on 9/15 with L sided weakness and R gaze preference after falling. 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  PMH includes: afib, HTN, DM2, OA, bil TKR, R shoulder surgery.    PT Comments  Pt admitted with above diagnosis. Pt with improved sitting balance today. Working on midline orientation and balance.  Pt required max assist of 1 person (+2 not available) to transfer to chair. Continue to recommend AIR.  Will follow acutely.  Pt currently with functional limitations due to the deficits listed below (see PT Problem List). Pt will benefit from acute skilled PT to increase their independence and safety with mobility to allow discharge.       If plan is discharge home, recommend the following: Two people to help with walking and/or transfers;Two people to help with bathing/dressing/bathroom   Can travel by private vehicle        Equipment Recommendations  BSC/3in1;Wheelchair (measurements PT);Wheelchair cushion (measurements PT)    Recommendations for Other Services Rehab consult     Precautions / Restrictions Precautions Precautions: Fall;Other (comment) Recall of Precautions/Restrictions: Impaired Precaution/Restrictions Comments: L homonymous hemianopsia Restrictions Weight Bearing Restrictions Per Provider Order: No     Mobility  Bed Mobility Overal bed mobility: Needs Assistance Bed Mobility: Supine to Sit     Supine to sit: Contact guard     General bed mobility comments: Pt exiting R side of bed with improved efficiency in compared to yesterday, cues for bringing R hip forward to edge    Transfers Overall transfer level: Needs assistance Equipment used: Rolling walker (2 wheels), 2 person hand held assist Transfers: Sit to/from Stand, Bed to chair/wheelchair/BSC Sit to Stand: Max assist,  From elevated surface     Squat pivot transfers: Max assist     General transfer comment: Attempted to stand to RW with +1 assist and pt unable. Pt needs manual placement of feet shoulder width apart, facilitation to bring LUE up to RW and support, pushing off with RUE from chair arm with pt not being able to stand with only 1 person assist. Max assist of 1 to perform squat pivot transfer towards right with use of pad (+2 not available).    Ambulation/Gait               General Gait Details: unable   Stairs             Wheelchair Mobility     Tilt Bed    Modified Rankin (Stroke Patients Only) Modified Rankin (Stroke Patients Only) Pre-Morbid Rankin Score: Slight disability Modified Rankin: Severe disability     Balance Overall balance assessment: Needs assistance Sitting-balance support: Feet supported, No upper extremity supported Sitting balance-Leahy Scale: Fair Sitting balance - Comments: Fair statically and needed min to CGA dynamically with pt performing reaching with cues and assist. Pt has difficulty looking to left without cues.  Family member commented that pts sitting balance was much better today. performed reaching tasks and exercises EOB for up to 15 min   Standing balance support: Bilateral upper extremity supported Standing balance-Leahy Scale: Poor Standing balance comment: Requiring max A                            Communication Communication Communication: No apparent difficulties  Cognition Arousal: Alert Behavior During Therapy:  WFL for tasks assessed/performed   PT - Cognitive impairments: Awareness, Attention, Sequencing, Problem solving, Safety/Judgement                       PT - Cognition Comments: Pt A&Ox4, decreased insight into deficits Following commands: Impaired Following commands impaired: Follows one step commands with increased time    Cueing Cueing Techniques: Verbal cues, Tactile cues  Exercises  General Exercises - Lower Extremity Long Arc Quad: AAROM, AROM, Both, 10 reps, Seated Heel Slides: AROM, 5 reps, Both, Supine Hip Flexion/Marching: AROM, Both, 5 reps, Seated    General Comments        Pertinent Vitals/Pain Pain Assessment Pain Assessment: Faces Faces Pain Scale: Hurts little more Pain Location: grimacing with repositioning of LLE Pain Descriptors / Indicators: Grimacing Pain Intervention(s): Limited activity within patient's tolerance, Monitored during session, Repositioned    Home Living                          Prior Function            PT Goals (current goals can now be found in the care plan section) Acute Rehab PT Goals Patient Stated Goal: go home Progress towards PT goals: Progressing toward goals    Frequency    Min 3X/week      PT Plan      Co-evaluation              AM-PAC PT 6 Clicks Mobility   Outcome Measure  Help needed turning from your back to your side while in a flat bed without using bedrails?: A Little Help needed moving from lying on your back to sitting on the side of a flat bed without using bedrails?: A Little Help needed moving to and from a bed to a chair (including a wheelchair)?: A Lot Help needed standing up from a chair using your arms (e.g., wheelchair or bedside chair)?: A Lot Help needed to walk in hospital room?: Total Help needed climbing 3-5 steps with a railing? : Total 6 Click Score: 12    End of Session Equipment Utilized During Treatment: Gait belt Activity Tolerance: Patient tolerated treatment well Patient left: in chair;with call bell/phone within reach;with chair alarm set Nurse Communication: Mobility status;Need for lift equipment PT Visit Diagnosis: Unsteadiness on feet (R26.81);Difficulty in walking, not elsewhere classified (R26.2);Other symptoms and signs involving the nervous system (R29.898);Hemiplegia and hemiparesis Hemiplegia - Right/Left: Left Hemiplegia -  dominant/non-dominant: Non-dominant Hemiplegia - caused by: Cerebral infarction     Time: 9041-8974 PT Time Calculation (min) (ACUTE ONLY): 27 min  Charges:    $Therapeutic Exercise: 8-22 mins $Therapeutic Activity: 8-22 mins PT General Charges $$ ACUTE PT VISIT: 1 Visit                     Lavarr President M,PT Acute Rehab Services [phone]    Stephane JULIANNA Bevel 01/24/2024, 2:02 PM

## 2024-01-24 NOTE — Discharge Summary (Signed)
 Name: Megan Fischer MRN: [id number] DOB: [birth year] 73 y.o. PCP: Bernadine Manos, MD  Date of Admission: 01/21/2024  7:24 AM Date of Discharge: 01/24/2024 Attending Physician: Dr. Reyes Fenton  Discharge Diagnosis: 1. Principal Problem:   Acute cerebrovascular accident (CVA) due to occlusion of left posterior cerebral artery (HCC) Active Problems:   Atrial fibrillation (HCC)   Hypertension   DM (diabetes mellitus) type II controlled with renal manifestation (HCC)   Multinodular goiter   Stroke [facility])    Discharge Medications: Allergies as of 01/24/2024       Reactions   Aspirin  Nausea Only   Patient stated to neuro that it caused an upset stomach   Codeine Other (See Comments)   Unknown    Fentanyl  Other (See Comments)   hypoxia   Oxycodone Hcl Other (See Comments)   Unknown    Penicillins Other (See Comments)   Unknown    Chlorhexidine Gluconate Rash        Medication List     PAUSE taking these medications    furosemide  40 MG tablet Wait to take this until your doctor or other care provider tells you to start again. Commonly known as: Lasix  Take 1 tablet (40 mg total) by mouth daily.       STOP taking these medications    diltiazem  360 MG 24 hr capsule Commonly known as: CARDIZEM  CD   mupirocin  ointment 2 % Commonly known as: BACTROBAN        TAKE these medications    amLODipine  10 MG tablet Commonly known as: NORVASC  Take 1 tablet (10 mg total) by mouth daily. Start taking on: January 25, 2024   apixaban  5 MG Tabs tablet Commonly known as: Eliquis  Take 1 tablet (5 mg total) by mouth 2 (two) times daily.   aspirin  EC 81 MG tablet Take 1 tablet (81 mg total) by mouth daily. Swallow whole. Start taking on: January 25, 2024   benazepril  20 MG tablet Commonly known as: LOTENSIN  Take 1 tablet (20 mg total) by mouth daily.   CALCIUM  PO Take 1 tablet by mouth 2 (two) times daily.   DULoxetine  60 MG capsule Commonly known as:  CYMBALTA  Take 60 mg by mouth daily.   empagliflozin  25 MG Tabs tablet Commonly known as: Jardiance  Take 1 tablet (25 mg total) by mouth daily before breakfast.   ergocalciferol 1.25 MG (50000 UT) capsule Commonly known as: VITAMIN D2 Take 50,000 Units by mouth once a week.   ezetimibe  10 MG tablet Commonly known as: ZETIA  Take 1 tablet (10 mg total) by mouth daily.   ferrous sulfate  325 (65 FE) MG EC tablet Take 1 tablet (325 mg total) by mouth 2 (two) times daily. What changed: when to take this   glimepiride  1 MG tablet Commonly known as: AMARYL  Take 1 tablet (1 mg total) by mouth daily with breakfast.   methimazole  5 MG tablet Commonly known as: TAPAZOLE  Take 1 tablet (5 mg total) by mouth daily.   methocarbamol  500 MG tablet Commonly known as: ROBAXIN  TAKE 2 TABLETS BY MOUTH EVERY 6  HOURS AS NEEDED FOR MUSCLE  SPASMS   metoprolol  succinate 50 MG 24 hr tablet Commonly known as: TOPROL -XL TAKE 1 TABLET BY MOUTH DAILY  WITH OR IMMEDIATELY FOLLOWING A  MEAL   OneTouch Verio test strip Generic drug: glucose blood USE AS DIRECTED TO CHECK  BLOOD SUGAR TWICE DAILY   Ozempic  (2 MG/DOSE) 8 MG/3ML Sopn Generic drug: Semaglutide  (2 MG/DOSE) INJECT SUBCUTANEOUSLY 2 MG EVERY  WEEK   rosuvastatin  20 MG tablet Commonly known as: CRESTOR  Take 1 tablet (20 mg total) by mouth daily. Start taking on: January 25, 2024 What changed:  medication strength how much to take        Disposition and follow-up:   [name] was discharged from [facility] in Stable condition.  At the hospital follow up visit please address:  1.  HTN- did start patient on amlodipine  10 mg for better blood pressure control as goal BP is normotensive.Could also consider starting patient on thiazide diuretic with benazepril , thiazide combo as an outpatient if better control is needed or patient would like to reduce pill burden.   2.  Labs / imaging needed at time of follow-up:  Lipid panel repeat in 3 months and BMP for kidney function   3.  Pending labs/ test needing follow-up: None   Follow-up Appointments:  Follow-up Information     [facility]. Schedule an appointment as soon as possible for a visit in 1 month(s).   Specialty: Neurology Why: stroke clinic Contact information: [address] [place]  [phone]              Please follow up with the Internal Medicine Clinic within one week of being discharged from the inpatient rehab.    Hospital Course by problem list:  Megan Fischer is a 73 y.o. with a pertinent PMH of A-fib on Eliquis , hypertension, hypothyroidism, CKD stage IIIb and type 2 diabetes, who presented with left-sided weakness and right gaze preference and admitted for acute right PCA infarct and management.    #Acute right PCA stroke  - Patient presented to the ED for left side weakness and right gaze preference. She was found on CT and MRI to have right PCA stroke and her right ICA was occluded. She was outside of the window for TPA, and the location of the clot was unsafe for thrombectomy.  Echo was clear for PFO and clot. Her A1c was 6.5 and lipid profile was remarkable for cholesterol of 232 and LDL was 123 for which we increased rosuvastatin  to 20 mg. Her eliquis  was held for 72 hours and her blood pressure medications were held for 48 hours for permissive hypertension. Neurology thought her stroke was due to both A-fib and large vessel disease. Patient also continued to work with speech.  PT and OT recommended CIR for patient   #Hyperlipidemia  - Her cholesterol was 232 and her LDL was 123 this admission. She was on Zetia  10 mg and rosuvastatin  10 mg for management, and now increased to rosuvastatin  20 mg due to high LDL. Goal is less than 70    #Atrial fibrillation   - Likely contributed to her stroke. Echo done was clear for thrombus in the left atrial  appendage. She was on telemetry for monitoring.  She was on eliquis  on admissions, which we held for 72 hours. We kept patient on plavix , and then discontinued for eliquis  and aspirin  81 mg. We also held her metoprolol  to allow for permissive hypertension which we restarted at 48 hours.  #Hypertension  - We held her blood pressure medications to allow for permissive hypertension. We continued Metoprolol  and benazepril  at 48 hours. Also added amlodipine  10 mg for further blood pressure control   #Type 2 diabetes  - A1c this admission was 6.5, which shows adequate control.Goal of less than 7.  We provided SSI 3 times daily with meals  and at night.   #CKD stage IIIb  - Her creatinine was 1.40 on admission. Creatinine at time of discharge 1.45   #Hypothyroidism  - TSH this admission was 1.627. We continued methimazole  at 5 mg daily.   Discharge Subjective: Patient is sitting up in bed this morning. He says that she does not think the food tastes very good. Did tell her that her cholesterol was on the higher side we increased dosages of her medications. Patient was seen that she is ready to get back to her daily life and to discuss with her inpatient rehab and that she may be here for couple weeks and will need to continue physical therapy in the outpatient side. Patient is agreeable to plan   Discharge Exam:   BP (!) 168/107 (BP Location: Left Arm)   Pulse 77   Temp 97.7 F (36.5 C) (Oral)   Resp 19   Ht 5' 3 (1.6 m)   Wt 87 kg   SpO2 98%   BMI 33.98 kg/m  Discharge exam:  Constitutional:      General: She is not in acute distress. Cardiovascular:     Rate and Rhythm: Normal rate. Rhythm irregular.     Heart sounds: No murmur heard. Pulmonary:     Effort: Pulmonary effort is normal. No respiratory distress.  Neurological:     Mental Status: She is alert.     Comments: Alert and oriented Left Homonymous hemianopia  Had 4+ out of 5 grip strength on the left 5 out of 5 on the  right   Pertinent Labs, Studies, and Procedures:     Latest Ref Rng & Units 01/22/2024    9:15 AM 01/21/2024    9:02 AM 01/21/2024    7:27 AM  CBC  WBC 4.0 - 10.5 K/uL 4.2  8.2    Hemoglobin 12.0 - 15.0 g/dL 84.9  86.7  86.0   Hematocrit 36.0 - 46.0 % 46.6  41.9  41.0   Platelets 150 - 400 K/uL 285  224         Latest Ref Rng & Units 01/23/2024    7:39 AM 01/22/2024    9:15 AM 01/21/2024    9:02 AM  CMP  Glucose 70 - 99 mg/dL 878  874  863   BUN 8 - 23 mg/dL 29  25  25    Creatinine 0.44 - 1.00 mg/dL 8.54  8.67  8.72   Sodium 135 - 145 mmol/L 142  142  141   Potassium 3.5 - 5.1 mmol/L 4.1  4.2  4.0   Chloride 98 - 111 mmol/L 103  101  102   CO2 22 - 32 mmol/L 21  24  26    Calcium  8.9 - 10.3 mg/dL 9.4  89.7  9.7   Total Protein 6.5 - 8.1 g/dL   8.0   Total Bilirubin 0.0 - 1.2 mg/dL   0.8   Alkaline Phos 38 - 126 U/L   95   AST 15 - 41 U/L   31   ALT 0 - 44 U/L   21     MR BRAIN WO CONTRAST Result Date: 01/21/2024 CLINICAL DATA:  Stroke, follow up. EXAM: MRI HEAD WITHOUT CONTRAST TECHNIQUE: Multiplanar, multiecho pulse sequences of the brain and surrounding structures were obtained without intravenous contrast. COMPARISON:  Head CT, CTA, and CTP 01/21/2024 FINDINGS: The study is intermittently moderately motion degraded. Brain: There is a moderately large acute right PCA infarct involving much of the occipital lobe and  portions of the medial right temporal lobe and right thalamus. There is associated cytotoxic edema without midline shift or other significant mass effect. Chronic microhemorrhages are noted in the right frontal and likely right parietal lobes. T2 hyperintensities elsewhere in the cerebral white matter bilaterally are nonspecific but compatible with mild chronic small vessel ischemic disease. Moderate chronic small vessel changes are present in the pons. No mass or extra-axial fluid collection is evident. A partially empty sella is noted. Mild cerebral atrophy is within  normal limits for age. Vascular: More fully evaluated on today's earlier CTA. Skull and upper cervical spine: Unremarkable bone marrow signal. Sinuses/Orbits: Bilateral cataract extraction. Trace right mastoid fluid. Minimal left ethmoid sinus mucosal thickening. Other: None. IMPRESSION: 1. Moderately large acute right PCA infarct. 2. Mild chronic small vessel ischemic disease. Electronically Signed   By: Dasie Hamburg M.D.   On: 01/21/2024 16:58   ECHOCARDIOGRAM COMPLETE Result Date: 01/21/2024    ECHOCARDIOGRAM REPORT   Patient Name:   Megan Fischer Date of Exam: 01/21/2024 Medical Rec #:  [id number]     Height:       63.0 in Accession #:    [id number]    Weight:       191.8 lb Date of Birth:  1951/01/17    BSA:          1.900 m Patient Age:    72 years      BP:           183/97 mmHg Patient Gender: F             HR:           74 bpm. Exam Location:  Inpatient Procedure: 2D Echo, Cardiac Doppler, Color Doppler and Saline Contrast Bubble            Study (Both Spectral and Color Flow Doppler were utilized during            procedure). Indications:    Stroke  History:        Patient has prior history of Echocardiogram examinations, most                 recent 08/25/2021. Abnormal ECG, Stroke, Arrythmias:Atrial                 Fibrillation, Signs/Symptoms:Chest Pain; Risk                 Factors:Hypertension, Diabetes and Dyslipidemia.  Sonographer:    Ellouise Mose RDCS Referring Phys: [id number] JEFFREY C HATCHER IMPRESSIONS  1. Left ventricular ejection fraction, by estimation, is 60 to 65%. The left ventricle has normal function. The left ventricle has no regional wall motion abnormalities. There is mild left ventricular hypertrophy. Left ventricular diastolic parameters are indeterminate.  2. Right ventricular systolic function is normal. The right ventricular size is normal. There is moderately elevated pulmonary artery systolic pressure. The estimated right ventricular systolic pressure is 56.2 mmHg.  3. The mitral  valve is normal in structure. Trivial mitral valve regurgitation. No evidence of mitral stenosis.  4. Tricuspid valve regurgitation is moderate.  5. The aortic valve is tricuspid. Aortic valve regurgitation is not visualized. No aortic stenosis is present.  6. Agitated saline contrast bubble study was negative, with no evidence of any interatrial shunt. Comparison(s): No significant change from prior study. FINDINGS  Left Ventricle: Left ventricular ejection fraction, by estimation, is 60 to 65%. The left ventricle has normal function. The left ventricle has no regional wall motion abnormalities. The  left ventricular internal cavity size was normal in size. There is  mild left ventricular hypertrophy. Left ventricular diastolic parameters are indeterminate. Right Ventricle: The right ventricular size is normal. No increase in right ventricular wall thickness. Right ventricular systolic function is normal. There is moderately elevated pulmonary artery systolic pressure. The tricuspid regurgitant velocity is 3.21 m/s, and with an assumed right atrial pressure of 15 mmHg, the estimated right ventricular systolic pressure is 56.2 mmHg. Left Atrium: Left atrial size was normal in size. Right Atrium: Right atrial size was normal in size. Pericardium: There is no evidence of pericardial effusion. Mitral Valve: The mitral valve is normal in structure. Mild mitral annular calcification. Trivial mitral valve regurgitation. No evidence of mitral valve stenosis. Tricuspid Valve: The tricuspid valve is normal in structure. Tricuspid valve regurgitation is moderate . No evidence of tricuspid stenosis. Aortic Valve: The aortic valve is tricuspid. Aortic valve regurgitation is not visualized. No aortic stenosis is present. Pulmonic Valve: The pulmonic valve was normal in structure. Pulmonic valve regurgitation is mild. No evidence of pulmonic stenosis. Aorta: The aortic root and ascending aorta are structurally normal, with no  evidence of dilitation. IAS/Shunts: No atrial level shunt detected by color flow Doppler. Agitated saline contrast was given intravenously to evaluate for intracardiac shunting. Agitated saline contrast bubble study was negative, with no evidence of any interatrial shunt.  LEFT VENTRICLE PLAX 2D LVIDd:         4.10 cm LVIDs:         2.80 cm LV PW:         1.20 cm LV IVS:        1.20 cm LVOT diam:     2.20 cm LV SV:         70 LV SV Index:   37 LVOT Area:     3.80 cm  LV Volumes (MOD) LV vol d, MOD A2C: 74.0 ml LV vol d, MOD A4C: 79.2 ml LV vol s, MOD A2C: 29.8 ml LV vol s, MOD A4C: 34.7 ml LV SV MOD A2C:     44.2 ml LV SV MOD A4C:     79.2 ml LV SV MOD BP:      44.1 ml RIGHT VENTRICLE            IVC RV S prime:     9.36 cm/s  IVC diam: 1.60 cm TAPSE (M-mode): 0.9 cm LEFT ATRIUM             Index        RIGHT ATRIUM           Index LA diam:        4.00 cm 2.11 cm/m   RA Area:     15.70 cm LA Vol (A2C):   50.1 ml 26.37 ml/m  RA Volume:   40.90 ml  21.53 ml/m LA Vol (A4C):   51.3 ml 27.01 ml/m LA Biplane Vol: 51.1 ml 26.90 ml/m  AORTIC VALVE LVOT Vmax:   92.00 cm/s LVOT Vmean:  59.000 cm/s LVOT VTI:    0.184 m  AORTA Ao Root diam: 2.70 cm Ao Asc diam:  3.20 cm MITRAL VALVE               TRICUSPID VALVE MV Area (PHT): 3.31 cm    TR Peak grad:   41.2 mmHg MV Decel Time: 229 msec    TR Vmax:        321.00 cm/s MV E velocity: 84.90 cm/s  SHUNTS                            Systemic VTI:  0.18 m                            Systemic Diam: 2.20 cm Aditya Sabharwal Electronically signed by Ria Commander Signature Date/Time: 01/21/2024/3:56:26 PM    Final    CT C-SPINE NO CHARGE Result Date: 01/21/2024 CLINICAL DATA:  Left-sided weakness.  Fall when getting out of bed. EXAM: CT CERVICAL SPINE WITHOUT CONTRAST TECHNIQUE: Multidetector CT imaging of the cervical spine was performed without intravenous contrast. Multiplanar CT image reconstructions were also generated. RADIATION DOSE REDUCTION:  This exam was performed according to the departmental dose-optimization program which includes automated exposure control, adjustment of the mA and/or kV according to patient size and/or use of iterative reconstruction technique. COMPARISON:  None Available. FINDINGS: Alignment: Reversal of the normal cervical lordosis. Grade 1 anterolisthesis of C3 on C4, C4 on C5, and C5 on C6. Skull base and vertebrae: No acute fracture or destructive process. Vertebral body and facet ankylosis at C5-6. Soft tissues and spinal canal: The soft tissues are more fully evaluated on the contemporaneous CTA of the head and neck. Disc levels: Advanced disc space narrowing and degenerative endplate changes at C6-7. Asymmetrically advanced facet arthrosis on the left at C2-3 and C3-4 and on the right at C4-5. Mild to moderate right neural foraminal stenosis at C4-5 and C5-6. No evidence of high-grade spinal canal stenosis. Upper chest: No mass or consolidation in the included lung apices. Other: None. IMPRESSION: 1. No acute cervical spine fracture. 2. Multilevel disc and facet degeneration. Electronically Signed   By: Dasie Hamburg M.D.   On: 01/21/2024 08:28   CT ANGIO HEAD NECK W WO CM W PERF (CODE STROKE) Result Date: 01/21/2024 CLINICAL DATA:  Neuro deficit, acute, stroke suspected. EXAM: CT ANGIOGRAPHY HEAD AND NECK CT PERFUSION BRAIN TECHNIQUE: Multidetector CT imaging of the head and neck was performed using the standard protocol during bolus administration of intravenous contrast. Multiplanar CT image reconstructions and MIPs were obtained to evaluate the vascular anatomy. Carotid stenosis measurements (when applicable) are obtained utilizing NASCET criteria, using the distal internal carotid diameter as the denominator. Multiphase CT imaging of the brain was performed following IV bolus contrast injection. Subsequent parametric perfusion maps were calculated using RAPID software. RADIATION DOSE REDUCTION: This exam was  performed according to the departmental dose-optimization program which includes automated exposure control, adjustment of the mA and/or kV according to patient size and/or use of iterative reconstruction technique. CONTRAST:  OMNIPAQUE  IOHEXOL  350 MG/ML SOLN COMPARISON:  None Available. FINDINGS: CTA NECK FINDINGS Aortic arch: Normal variant aortic arch branching pattern with common origin of the brachiocephalic and left common carotid arteries. Calcified atherosclerosis without significant stenosis of the arch vessel origins. Right carotid system: The common carotid artery is widely patent, however there is mixed calcified and soft plaque at the carotid bifurcation with occlusion of the ICA at its origin and without reconstitution in the neck. Left carotid system: Patent without evidence of stenosis or dissection. Partially retropharyngeal course of the ICA. Vertebral arteries: Patent without evidence of stenosis or dissection. Mildly dominant right vertebral artery. Skeleton: Cervical spine reported separately. Other neck: Diffusely enlarged and heterogeneous thyroid  containing multiple nodules including a 4 cm nodule on the right, previously evaluated by ultrasound. No evidence of cervical lymphadenopathy. Upper  chest: No mass or consolidation in the included lung apices. Review of the MIP images confirms the above findings CTA HEAD FINDINGS Anterior circulation: The intracranial left ICA is patent with mild atherosclerotic calcification not resulting in a significant stenosis. There is reconstitution of the right ICA beginning in the cavernous segment. There is mild diffuse attenuation of the right MCA compared to the left without evidence of a significant M1 stenosis or proximal branch occlusion. The ACAs and left MCA are patent without evidence of a significant proximal stenosis. No aneurysm is identified. Posterior circulation: The intracranial vertebral arteries are widely patent to the basilar.  Patent right PICA and bilateral SCA origins are visualized. AICAs and a left PICA are not clearly identified. The basilar artery is widely patent. There are small right and diminutive or absent left posterior communicating arteries. There is occlusion of the proximal right P2 segment without significant distal collateralization. The left PCA is patent without evidence of a significant proximal stenosis. No aneurysm is identified. Venous sinuses: As permitted by contrast timing, patent. Anatomic variants: None. Review of the MIP images confirms the above findings CT Brain Perfusion Findings: ASPECTS: 10 CBF (<30%) Volume: 9 mL Perfusion (Tmax>6.0s) volume: 42 mL Mismatch Volume: 33 mL Infarction Location:Right PCA territory These results were communicated to Dr. Voncile at 7:54 am on 01/21/2024 by text page via the [facility] messaging system. IMPRESSION: 1. 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 2. Proximal right P2 occlusion. 3. 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 4.  Aortic Atherosclerosis (ICD10-I70.0). Electronically Signed   By: Dasie Hamburg M.D.   On: 01/21/2024 08:09   CT HEAD CODE STROKE WO CONTRAST Result Date: 01/21/2024 CLINICAL DATA:  Code stroke. Neuro deficit, acute, stroke suspected. EXAM: CT HEAD WITHOUT CONTRAST TECHNIQUE: Contiguous axial images were obtained from the base of the skull through the vertex without intravenous contrast. RADIATION DOSE REDUCTION: This exam was performed according to the departmental dose-optimization program which includes automated exposure control, adjustment of the mA and/or kV according to patient size and/or use of iterative reconstruction technique. COMPARISON:  None Available. FINDINGS: Brain: Confluent hypodensity involving cortex and white matter in the medial right occipital lobe extending into the posterior temporal lobe is consistent with an acute to subacute right PCA infarct. There is also involvement of the right  thalamus. No intracranial hemorrhage, midline shift, hydrocephalus, or extra-axial fluid collection is evident. A partially empty sella is noted. Vascular: Calcified atherosclerosis at the skull base. No hyperdense vessel. Skull: No acute fracture or suspicious lesion. Sinuses/Orbits: Visualized paranasal sinuses and mastoid air cells are clear. Bilateral cataract extraction. Other: None. ASPECTS ([place] Stroke Program Early CT Score) - Ganglionic level infarction (caudate, lentiform nuclei, internal capsule, insula, M1-M3 cortex): 7 - Supraganglionic infarction (M4-M6 cortex): 3 Total score (0-10 with 10 being normal): 10 These results were communicated to Dr. Arora at 7:54 am on 01/21/2024 by text page via the [facility] messaging system. IMPRESSION: Moderately large acute to subacute right PCA infarct. No intracranial hemorrhage. Electronically Signed   By: Dasie Hamburg M.D.   On: 01/21/2024 07:55     Discharge Instructions: Discharge Instructions     Ambulatory referral to Neurology   Complete by: As directed    Follow up with stroke clinic NP at [facility] in about 4-6 weeks. Thanks.       Signed: D'Mello, Genevia Bouldin, DO 01/24/2024, 3:13 PM

## 2024-01-24 NOTE — Evaluation (Signed)
 SLP Cancellation Note  Patient Details Name: Megan Fischer MRN: [id number] DOB: [birth year]   Cancelled treatment:       Reason Eval/Treat Not Completed: Other (comment);Fatigue/lethargy limiting ability to participate (pt sound asleep, RN reports she is hard to wake up in the morning) Will continue efforts  Madelin POUR, MS [facility] SLP Acute Rehab Services Office [phone]  Nicolas Emmie Caldron 01/24/2024, 8:52 AM

## 2024-01-24 NOTE — Progress Notes (Signed)
 Inpatient Rehab Admissions Coordinator:    I have insurance approval and a bed available for pt to admit to CIR today. Dr. Kem in agreement and [facility] aware.  I will notify patient/family and make arrangements.    Reche Lowers, PT, DPT Admissions Coordinator [phone] 01/24/24  3:09 PM

## 2024-01-24 NOTE — TOC Transition Note (Signed)
 Transition of Care [facility]) - Discharge Note   Patient Details  Name: Megan Fischer MRN: [id number] Date of Birth: 1950-11-09  Transition of Care [facility]) CM/SW Contact:  [name] JULIANNA George, RN Phone Number: 01/24/2024, 3:11 PM   Clinical Narrative:     Pt is discharging to CIR today. IP Care Management signing off.   Final next level of care: IP Rehab Facility Barriers to Discharge: No Barriers Identified   Patient Goals and CMS Choice   CMS Medicare.gov Compare Post Acute Care list provided to:: Patient Choice offered to / list presented to : Patient      Discharge Placement                       Discharge Plan and Services Additional resources added to the After Visit Summary for       Post Acute Care Choice: IP Rehab                               Social Drivers of Health (SDOH) Interventions SDOH Screenings   Food Insecurity: No Food Insecurity (01/21/2024)  Housing: Unknown (01/22/2024)  Transportation Needs: No Transportation Needs (01/21/2024)  Utilities: Not At Risk (01/21/2024)  Alcohol Screen: Low Risk  (03/01/2023)  Depression (PHQ2-9): Low Risk  (11/06/2023)  Financial Resource Strain: Low Risk  (10/19/2023)   Received from [facility]  Physical Activity: Insufficiently Active (03/01/2023)  Social Connections: Unknown (01/21/2024)  Stress: No Stress Concern Present (03/01/2023)  Tobacco Use: Low Risk  (01/22/2024)  Health Literacy: Adequate Health Literacy (03/01/2023)     Readmission Risk Interventions     No data to display

## 2024-01-24 NOTE — H&P (Signed)
 Physical Medicine and Rehabilitation Admission H&P    Chief Complaint  Patient presents with   Functional deficits due to stroke    HPI: Megan Fischer is a 73 year old female with history of CAF, T2DM, giant cell bone tumor who was admitted on 01/21/24 with left sided weakness, left facial weakness and L-HH. CT head showed moderately large acute to subacute PCA infarct. MRI brain showed large acute R-PCA infarct involving much of occipital lobe and portions of medial right temporal and right thalamus. CTA head/neck showed occlusion of R-ICA with intra-cranial reconstitution and acute R-PCA infarct with 33 ml penumbra.   2 D echo showed EF 60-65% with no wall etiology. Patient has ASA allergy and Neurology recommended Plavix  with plans to resume Eliquis  on 09/18. Low dose ECASA also added additionally and Crestor  increased from 10-20 mg due to hyperlipidemia.  Amlodipine  was added for blood pressure control.  Reports last bowel movement was about 2 days ago.  PT/OT consulted and patient impaired by visual deficits as well as left sided weakness with sensory deficits. She requires +2 mod assist for transfers, max assist with sitting balance and mod assist +2 with ADLs. She was independent til 1-2 months PTA. She has been having falls and son/daughter in law have been assist PTA. She has been using a walker for a couple of months but limited distances and perform ADLs. CIR recommended due to functional decline.    Review of Systems  Constitutional:  Negative for chills and fever.  HENT:  Negative for hearing loss.   Eyes:  Negative for blurred vision.       Can't see out of left eye  Respiratory:  Negative for cough and shortness of breath.   Cardiovascular:  Negative for chest pain.  Gastrointestinal:  Positive for constipation. Negative for heartburn and nausea.  Genitourinary:  Negative for dysuria.  Musculoskeletal:  Positive for back pain, falls (for months) and joint pain (both  knees--need replacement.).       Legs have been weak but getting worse.   Neurological:  Positive for sensory change (feet get numb sometimes. Sciatica on right--feels like pins and needles.) and weakness. Negative for headaches.  Psychiatric/Behavioral:  The patient does not have insomnia.     Past Medical History:  Diagnosis Date   Atrial fibrillation (HCC)    Chronic disease anemia 03/29/2011   Diabetes mellitus    Dyslipidemia    Hypertension    Obesities, morbid (HCC)    Osteoarthritis     Past Surgical History:  Procedure Laterality Date   CHOLECYSTECTOMY     REPLACEMENT TOTAL KNEE BILATERAL     SHOULDER SURGERY     RIGHT SHOULDER   TUBAL LIGATION      Family History  Problem Relation Age of Onset   Heart attack Mother    Stroke Mother    Diabetes Mother    Hypertension Brother    Breast cancer Neg Hx     Social History:  Was living alone prior to couple of months. Used to work for [employer]. Disabled since '76 due to tumor/weakness. She  reports that she has never smoked. She has never used smokeless tobacco. She reports that she does not drink alcohol and does not use drugs.    Allergies  Allergen Reactions   Aspirin  Nausea Only    Patient stated to neuro that it caused an upset stomach   Codeine Other (See Comments)    Unknown  Fentanyl  Other (See Comments)    hypoxia   Oxycodone Hcl Other (See Comments)    Unknown    Penicillins Other (See Comments)    Unknown    Chlorhexidine Gluconate Rash    Medications Prior to Admission  Medication Sig Dispense Refill   apixaban  (ELIQUIS ) 5 MG TABS tablet Take 1 tablet (5 mg total) by mouth 2 (two) times daily. 180 tablet 1   benazepril  (LOTENSIN ) 20 MG tablet Take 1 tablet (20 mg total) by mouth daily. 30 tablet 11   CALCIUM  PO Take 1 tablet by mouth 2 (two) times daily.     diltiazem  (CARDIZEM  CD) 360 MG 24 hr capsule TAKE 1 CAPSULE BY MOUTH DAILY 100 capsule 2   DULoxetine  (CYMBALTA ) 60 MG  capsule Take 60 mg by mouth daily.     empagliflozin  (JARDIANCE ) 25 MG TABS tablet Take 1 tablet (25 mg total) by mouth daily before breakfast. 100 tablet 3   ergocalciferol (VITAMIN D2) 50000 UNITS capsule Take 50,000 Units by mouth once a week.     ezetimibe  (ZETIA ) 10 MG tablet Take 1 tablet (10 mg total) by mouth daily. 90 tablet 2   ferrous sulfate  325 (65 FE) MG EC tablet Take 1 tablet (325 mg total) by mouth 2 (two) times daily. (Patient taking differently: Take 325 mg by mouth daily.) 60 tablet 3   furosemide  (LASIX ) 40 MG tablet Take 1 tablet (40 mg total) by mouth daily. 30 tablet 2   glimepiride  (AMARYL ) 1 MG tablet Take 1 tablet (1 mg total) by mouth daily with breakfast. 90 tablet 4   methimazole  (TAPAZOLE ) 5 MG tablet Take 1 tablet (5 mg total) by mouth daily. 90 tablet 3   methocarbamol  (ROBAXIN ) 500 MG tablet TAKE 2 TABLETS BY MOUTH EVERY 6  HOURS AS NEEDED FOR MUSCLE  SPASMS 240 tablet 2   metoprolol  succinate (TOPROL -XL) 50 MG 24 hr tablet TAKE 1 TABLET BY MOUTH DAILY  WITH OR IMMEDIATELY FOLLOWING A  MEAL 90 tablet 3   rosuvastatin  (CRESTOR ) 10 MG tablet Take 1 tablet (10 mg total) by mouth daily. 90 tablet 3   Semaglutide , 2 MG/DOSE, (OZEMPIC , 2 MG/DOSE,) 8 MG/3ML SOPN INJECT SUBCUTANEOUSLY 2 MG EVERY WEEK 9 mL 3   mupirocin  ointment (BACTROBAN ) 2 % Apply 1 Application topically 2 (two) times daily. Apply to finger wound twice daily and cover with dry bandage (Patient not taking: Reported on 01/22/2024) 22 g 0   ONETOUCH VERIO test strip USE AS DIRECTED TO CHECK  BLOOD SUGAR TWICE DAILY 200 strip 3    Home: Home Living Family/patient expects to be discharged to:: Private residence (per OT note) Living Arrangements: Alone Available Help at Discharge: Family, Available 24 hours/day Type of Home: Apartment Home Access: Level entry Home Layout: One level Bathroom Shower/Tub: Engineer, manufacturing systems: Standard Home Equipment: Grab bars - tub/shower, Medical laboratory scientific officer - single point,  Toilet riser Additional Comments: senior living apt   Functional History: Prior Function Prior Level of Function : Independent/Modified Independent Mobility Comments: cane PRN ADLs Comments: does not drive, ind ADLs, IADls (fiances and meds)  Functional Status:  Mobility: Bed Mobility Overal bed mobility: Needs Assistance Bed Mobility: Supine to Sit Supine to sit: Contact guard General bed mobility comments: Pt exiting R side of bed with improved efficiency in compared to yesterday, cues for bringing R hip forward to edge Transfers Overall transfer level: Needs assistance Equipment used: Rolling walker (2 wheels), 2 person hand held assist Transfers: Sit to/from Stand, Bed to  chair/wheelchair/BSC Sit to Stand: Max assist, From elevated surface Bed to/from chair/wheelchair/BSC transfer type:: Squat pivot Stand pivot transfers: +2 physical assistance, Mod assist Squat pivot transfers: Max assist General transfer comment: Attempted to stand to RW with +1 assist and pt unable. Pt needs manual placement of feet shoulder width apart, facilitation to bring LUE up to RW and support, pushing off with RUE from chair arm with pt not being able to stand with only 1 person assist. Max assist of 1 to perform squat pivot transfer towards right with use of pad (+2 not available). Ambulation/Gait General Gait Details: unable    ADL: ADL Overall ADL's : Needs assistance/impaired Grooming: Moderate assistance, Sitting Upper Body Dressing : Moderate assistance, Sitting Lower Body Dressing: Total assistance, +2 for physical assistance Toilet Transfer: Maximal assistance, +2 for safety/equipment, +2 for physical assistance Toilet Transfer Details (indicate cue type and reason): bil hand held to recliner towards R side Functional mobility during ADLs: Moderate assistance, Maximal assistance, +2 for physical assistance, Cueing for safety, Cueing for sequencing General ADL Comments: pt limited by L  weakness, impaired vision and balance.  decreased awareness of deficits, poor initation and problem solvng  Cognition: Cognition Orientation Level: Oriented X4, Oriented to person, Oriented to place, Oriented to time, Oriented to situation Cognition Arousal: Alert Behavior During Therapy: [facility] for tasks assessed/performed   Blood pressure (!) 168/107, pulse 77, temperature 97.7 F (36.5 C), temperature source Oral, resp. rate 19, height 5' 3 (1.6 m), weight 87 kg, SpO2 98%.  General: No apparent distress, working with SLP HEENT: Head is normocephalic, atraumatic, sclera anicteric, oral mucosa moist Neck: Supple without JVD or lymphadenopathy Heart: Reg rate and rhythm.  Chest: CTA bilaterally without wheezes, rales, or rhonchi; no distress Abdomen: Soft, non-tender, non-distended, bowel sounds positive. Psych: flat, cooperative Skin: Clean and intact without signs of breakdown Neuro: Alert and oriented x 4, follows simple commands, able to name and repeat, left homonymous hemianopsia, right gaze preference able to cross midline.  Left facial weakness. Left upper and lower extremity weakness more proximal than distal  MSK: healed TKA incisions, no joint swelling noted    Results for orders placed or performed during the hospital encounter of 01/21/24 (from the past 48 hours)  Glucose, capillary     Status: Abnormal   Collection Time: 01/22/24  4:43 PM  Result Value Ref Range   Glucose-Capillary 107 (H) 70 - 99 mg/dL    Comment: Glucose reference range applies only to samples taken after fasting for at least 8 hours.  Glucose, capillary     Status: Abnormal   Collection Time: 01/22/24  9:34 PM  Result Value Ref Range   Glucose-Capillary 148 (H) 70 - 99 mg/dL    Comment: Glucose reference range applies only to samples taken after fasting for at least 8 hours.   Comment 1 Notify RN    Comment 2 Document in Chart   Glucose, capillary     Status: Abnormal   Collection Time:  01/23/24  6:24 AM  Result Value Ref Range   Glucose-Capillary 103 (H) 70 - 99 mg/dL    Comment: Glucose reference range applies only to samples taken after fasting for at least 8 hours.   Comment 1 Notify RN    Comment 2 Document in Chart   Basic metabolic panel with GFR     Status: Abnormal   Collection Time: 01/23/24  7:39 AM  Result Value Ref Range   Sodium 142 135 - 145 mmol/L  Potassium 4.1 3.5 - 5.1 mmol/L   Chloride 103 98 - 111 mmol/L   CO2 21 (L) 22 - 32 mmol/L   Glucose, Bld 121 (H) 70 - 99 mg/dL    Comment: Glucose reference range applies only to samples taken after fasting for at least 8 hours.   BUN 29 (H) 8 - 23 mg/dL   Creatinine, Ser 8.54 (H) 0.44 - 1.00 mg/dL   Calcium  9.4 8.9 - 10.3 mg/dL   GFR, Estimated 38 (L) >60 mL/min    Comment: (NOTE) Calculated using the CKD-EPI Creatinine Equation (2021)    Anion gap 18 (H) 5 - 15    Comment: Performed at [facility], [address], [place], [place] [address]  Glucose, capillary     Status: Abnormal   Collection Time: 01/23/24 12:24 PM  Result Value Ref Range   Glucose-Capillary 106 (H) 70 - 99 mg/dL    Comment: Glucose reference range applies only to samples taken after fasting for at least 8 hours.  Glucose, capillary     Status: Abnormal   Collection Time: 01/23/24  4:25 PM  Result Value Ref Range   Glucose-Capillary 123 (H) 70 - 99 mg/dL    Comment: Glucose reference range applies only to samples taken after fasting for at least 8 hours.  Glucose, capillary     Status: Abnormal   Collection Time: 01/23/24  9:57 PM  Result Value Ref Range   Glucose-Capillary 132 (H) 70 - 99 mg/dL    Comment: Glucose reference range applies only to samples taken after fasting for at least 8 hours.   Comment 1 Notify RN    Comment 2 Document in Chart   Glucose, capillary     Status: Abnormal   Collection Time: 01/24/24  6:24 AM  Result Value Ref Range   Glucose-Capillary 120 (H) 70 - 99 mg/dL    Comment: Glucose  reference range applies only to samples taken after fasting for at least 8 hours.   Comment 1 Notify RN   Glucose, capillary     Status: Abnormal   Collection Time: 01/24/24 11:53 AM  Result Value Ref Range   Glucose-Capillary 196 (H) 70 - 99 mg/dL    Comment: Glucose reference range applies only to samples taken after fasting for at least 8 hours.   No results found.    Blood pressure (!) 168/107, pulse 77, temperature 97.7 F (36.5 C), temperature source Oral, resp. rate 19, height 5' 3 (1.6 m), weight 87 kg, SpO2 98%.  Medical Problem List and Plan: 1. Functional deficits secondary to R PCA CVA involving right thalamus, likely cardioembolic with A-fib despite Eliquis  versus large vessel disease  -patient may shower  -ELOS/Goals: 15 to 18 days  - Admit to CIR 2.  Antithrombotics: -DVT/anticoagulation:  Pharmaceutical: Eliquis   -antiplatelet therapy: ECASA 3. Pain Management: tylenol  prn. Voltaren  gel qid to knee.  4. Mood/Behavior/Sleep: LCSW to follow for evaluation and support.  --Melatonin prn.   -antipsychotic agents: N/A 5. Neuropsych/cognition: This patient is capable of making decisions on her own behalf. 6. Skin/Wound Care: Routine pressure relief measures.  7. Fluids/Electrolytes/Nutrition: Monitor I/O. Check CMET in am.  8. A fib: Monitor HR TID--on metoprolol  --Eliquis  resumed 09/18 9. HTN: Monitor BP TID--on amlodipine , Lotensin  and Metoprolol .  Avoid hypotension and with long-term goal normotensive  -Fair control continue to monitor, internal medicine suggests outpatient on thiazide with benazepril  combo pill to reduce pill burden    01/25/2024    8:45 AM 01/25/2024  8:42 AM 01/25/2024    4:26 AM  Vitals with BMI  Weight  193 lbs 13 oz 183 lbs 10 oz  BMI  34.34 32.54  Systolic 141 141 850  Diastolic 85 85 75  Pulse 77 77 86    10. CKD: BUN/SCr 30/1.40 @ admission.  --Baseline likely 1.3-1.5 in the last few months.   --Recheck in am.   - 9/19 reorder  CMP, initial lab draw hemolyzed 11. T2DM: Hgb A1c- 6.5 and well controlled. Was on Semaglutide  1 mg, Amaryl  1 mg and Jardiance  25 mg PTA  --Jardiance  resumed on 09/18 --Monitor BS ac/hs and use SSI for elevated BS -9/19 fair control continue to monitor trend  CBG (last 3)  Recent Labs    01/24/24 2126 01/25/24 0611 01/25/24 1147  GLUCAP 136* 207* 135*    12. 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  13. Scoliosis/Spondylolisthesis: Has hx of chronic LBP w/BLE weakness --also endstage OA right knee--Diclofenac  gel qid 14. Hyperthyroid: Managed with Tapazole  5 mg daily.  15. Hyperlipidemia: LDL 123 w/goal < 70.  --On Crestor  20 mg (was increased) and Zetia .     Sharlet GORMAN Schmitz, PA-C 01/24/2024  I have personally performed a face to face diagnostic evaluation of this patient and formulated the key components of the plan.  Additionally, I have personally reviewed laboratory data, imaging studies, as well as relevant notes and concur with the physician assistant's documentation above.  The patient's status has not changed from the original H&P.  Any changes in documentation from the acute care chart have been noted above.  Murray Collier, MD

## 2024-01-24 NOTE — Progress Notes (Signed)
 Megan Albright, MD  Physician Physical Medicine and Rehabilitation   Consult Note    Addendum   Date of Service: 01/23/2024 10:27 AM  Related encounter: ED to Hosp-Admission (Current) from 01/21/2024 in [place] [facility]   Expand All Collapse All           Physical Medicine and Rehabilitation Consult Reason for Consult:Rehab Referring Physician: Dr. Kem     HPI: [name] is a 73 y.o. female with past medical history of A-fib, diabetes mellitus, dyslipidemia, hypertension, obesity, osteoarthritis, CKD 3B who was admitted after he woke up in the morning with left-sided weakness and when she got up had a fall.  Patient was also noted to have left facial droop and complete left homonymous hemianopsia.  CT head showed moderately large acute to subacute right PCA infarct.  MRI of the brain indicated moderately large acute right PCA infarct and mild chronic small vessel ischemic disease.  CT of the C-spine showed multilevel disc and facet degeneration.  Allergy feels like etiology is likely cardioembolic versus large vessel disease.  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  Patient now on clopidogrel  75 mg with plan to restart home Eliquis  and enteric-coated aspirin  on Thursday.   She is on a  carb modified diet. Patient seen by PT and OT and found to be a candidate for CIR.  Patient required mod assistance for upper body dressing, total assistance for lower body dressing, max assistance for toilet transfers, mod assist +2 for sit to stand, max assist for stand pivot transfers.    Per chart review she lives in a 1 level home with level entry.  Family can assist 24/7 after discharge.       Home: Home Living Family/patient expects to be discharged to:: Private residence (per OT note) Living Arrangements: Alone Available Help at Discharge: Family,  Available 24 hours/day Type of Home: Apartment Home Access: Level entry Home Layout: One level Bathroom Shower/Tub: Engineer, manufacturing systems: Standard Home Equipment: Grab bars - tub/shower, Medical laboratory scientific officer - single point, Toilet riser Additional Comments: senior living apt  Functional History: Prior Function Prior Level of Function : Independent/Modified Independent Mobility Comments: cane PRN ADLs Comments: does not drive, ind ADLs, IADls (fiances and meds) Functional Status:  Mobility: Bed Mobility Overal bed mobility: Needs Assistance Bed Mobility: Supine to Sit Supine to sit: Contact guard General bed mobility comments: Pt exiting R side of bed with increased time, use of rail, no physical assist required Transfers Overall transfer level: Needs assistance Equipment used: Rolling walker (2 wheels), 2 person hand held assist Transfers: Sit to/from Stand, Bed to chair/wheelchair/BSC Sit to Stand: Mod assist, +2 physical assistance Bed to/from chair/wheelchair/BSC transfer type:: Stand pivot Stand pivot transfers: Max assist, +2 physical assistance General transfer comment: ModA + 2 to stand up to RW, assist to place L hand up on handle and cues for pushing through R hand to power up to stand. Pt with increased left lateral lean and returned to sitting. Performed face to face transfer bed to chair towards R with maxA + 2, pt with difficulty clearing L foot and sequencing movement Ambulation/Gait General Gait Details: unable   ADL: ADL Overall ADL's : Needs assistance/impaired Grooming: Moderate assistance, Sitting Upper Body Dressing : Moderate assistance, Sitting Lower Body Dressing: Total assistance, +2 for physical assistance Toilet Transfer: Maximal assistance, +2 for safety/equipment, +2  for physical assistance Toilet Transfer Details (indicate cue type and reason): bil hand held to recliner towards R side Functional mobility during ADLs: Moderate assistance, Maximal  assistance, +2 for physical assistance, Cueing for safety, Cueing for sequencing General ADL Comments: pt limited by L weakness, impaired vision and balance.  decreased awareness of deficits, poor initation and problem solvng   Cognition: Cognition Orientation Level: Oriented X4 Cognition Arousal: Alert Behavior During Therapy: WFL for tasks assessed/performed     Review of Systems  Constitutional:  Negative for fever.  HENT:  Negative for hearing loss.   Eyes:  Negative for double vision.       Vision changes on the right  Respiratory:  Negative for cough and shortness of breath.   Cardiovascular:  Negative for chest pain.  Gastrointestinal:  Negative for abdominal pain, constipation, diarrhea, nausea and vomiting.  Genitourinary: Negative.   Musculoskeletal:  Negative for back pain, joint pain and neck pain.  Skin:  Negative for rash.  Neurological:  Positive for headaches. Negative for dizziness and sensory change.  Psychiatric/Behavioral:  The patient does not have insomnia.        Past Medical History:  Diagnosis Date   Atrial fibrillation (HCC)     Chronic disease anemia 03/29/2011   Diabetes mellitus     Dyslipidemia     Hypertension     Obesities, morbid (HCC)     Osteoarthritis               Past Surgical History:  Procedure Laterality Date   CHOLECYSTECTOMY       REPLACEMENT TOTAL KNEE BILATERAL       SHOULDER SURGERY        RIGHT SHOULDER   TUBAL LIGATION                 Family History  Problem Relation Age of Onset   Heart attack Mother     Stroke Mother     Diabetes Mother     Hypertension Brother     Breast cancer Neg Hx          Social History:  reports that she has never smoked. She has never used smokeless tobacco. She reports that she does not drink alcohol and does not use drugs. Allergies:  Allergies       Allergies  Allergen Reactions   Aspirin  Nausea Only      Patient stated to neuro that it caused an upset stomach   Codeine  Other (See Comments)      Unknown    Fentanyl  Other (See Comments)      hypoxia   Oxycodone Hcl Other (See Comments)      Unknown    Penicillins Other (See Comments)      Unknown    Chlorhexidine Gluconate Rash            Medications Prior to Admission  Medication Sig Dispense Refill   apixaban  (ELIQUIS ) 5 MG TABS tablet Take 1 tablet (5 mg total) by mouth 2 (two) times daily. 180 tablet 1   benazepril  (LOTENSIN ) 20 MG tablet Take 1 tablet (20 mg total) by mouth daily. 30 tablet 11   CALCIUM  PO Take 1 tablet by mouth 2 (two) times daily.       diltiazem  (CARDIZEM  CD) 360 MG 24 hr capsule TAKE 1 CAPSULE BY MOUTH DAILY 100 capsule 2   DULoxetine  (CYMBALTA ) 60 MG capsule Take 60 mg by mouth daily.       empagliflozin  (JARDIANCE ) 25  MG TABS tablet Take 1 tablet (25 mg total) by mouth daily before breakfast. 100 tablet 3   ergocalciferol (VITAMIN D2) 50000 UNITS capsule Take 50,000 Units by mouth once a week.       ezetimibe  (ZETIA ) 10 MG tablet Take 1 tablet (10 mg total) by mouth daily. 90 tablet 2   ferrous sulfate  325 (65 FE) MG EC tablet Take 1 tablet (325 mg total) by mouth 2 (two) times daily. (Patient taking differently: Take 325 mg by mouth daily.) 60 tablet 3   furosemide  (LASIX ) 40 MG tablet Take 1 tablet (40 mg total) by mouth daily. 30 tablet 2   glimepiride  (AMARYL ) 1 MG tablet Take 1 tablet (1 mg total) by mouth daily with breakfast. 90 tablet 4   methimazole  (TAPAZOLE ) 5 MG tablet Take 1 tablet (5 mg total) by mouth daily. 90 tablet 3   methocarbamol  (ROBAXIN ) 500 MG tablet TAKE 2 TABLETS BY MOUTH EVERY 6  HOURS AS NEEDED FOR MUSCLE  SPASMS 240 tablet 2   metoprolol  succinate (TOPROL -XL) 50 MG 24 hr tablet TAKE 1 TABLET BY MOUTH DAILY  WITH OR IMMEDIATELY FOLLOWING A  MEAL 90 tablet 3   rosuvastatin  (CRESTOR ) 10 MG tablet Take 1 tablet (10 mg total) by mouth daily. 90 tablet 3   Semaglutide , 2 MG/DOSE, (OZEMPIC , 2 MG/DOSE,) 8 MG/3ML SOPN INJECT SUBCUTANEOUSLY 2 MG EVERY WEEK 9  mL 3   mupirocin  ointment (BACTROBAN ) 2 % Apply 1 Application topically 2 (two) times daily. Apply to finger wound twice daily and cover with dry bandage (Patient not taking: Reported on 01/22/2024) 22 g 0   ONETOUCH VERIO test strip USE AS DIRECTED TO CHECK  BLOOD SUGAR TWICE DAILY 200 strip 3            Blood pressure (!) 171/96, pulse 82, temperature 98 F (36.7 C), temperature source Oral, resp. rate 20, height 5' 3 (1.6 m), weight 87 kg, SpO2 98%. Physical Exam   General: No apparent distress HEENT: Head is normocephalic, atraumatic, sclera anicteric, oral mucosa dry Neck: Supple without JVD or lymphadenopathy Heart: Reg rate and rhythm. No murmurs rubs or gallops Chest: CTA bilaterally without wheezes, rales, or rhonchi; no distress Abdomen: Soft, non-tender, non-distended, bowel sounds positive. Extremities: No clubbing, cyanosis, or edema. Pulses are 2+ Psych: flat Skin: Clean and intact without signs of breakdown Neuro:     Mental Status: AAOx4, right gaze preference but able to cross midline Speech/Languate: Naming and repetition intact, fluent, follows simple commands CRANIAL NERVES: II: PERRL.  Left homonymous hemianopsia III, IV, VI: EOM intact, no gaze preference or deviation V: normal sensation bilaterally VII: Left facial droop VIII: normal hearing to speech IX, X: normal palatal elevation XI: 5/5 head turn  XII: Tongue midline     MOTOR: RUE: 4+/5 Deltoid, 4+/5 Biceps, 4+/5 Triceps,4+/5 Grip LUE: 2/5 Deltoid, 4-/5 Biceps, 4-/5 Triceps, 4-/5 Grip RLE: HF 4/5, KE 4+/5, ADF 4+/5, APF 4+/5 LLE: HF 3/5, KE 1-2/5, ADF 4-/5, APF 4-/5     REFLEXES: No ankle clonus   SENSORY: Normal to touch all 4 extremities   Coordination: finger to nose altered on left side appears in proportion to weakness   MSK: healed TKA incisions, no joint swelling noted     Lab Results Last 24 Hours       Results for orders placed or performed during the hospital encounter of  01/21/24 (from the past 24 hours)  Glucose, capillary     Status: Abnormal    Collection  Time: 01/22/24 11:44 AM  Result Value Ref Range    Glucose-Capillary 177 (H) 70 - 99 mg/dL  Glucose, capillary     Status: Abnormal    Collection Time: 01/22/24  4:43 PM  Result Value Ref Range    Glucose-Capillary 107 (H) 70 - 99 mg/dL  Glucose, capillary     Status: Abnormal    Collection Time: 01/22/24  9:34 PM  Result Value Ref Range    Glucose-Capillary 148 (H) 70 - 99 mg/dL    Comment 1 Notify RN      Comment 2 Document in Chart    Glucose, capillary     Status: Abnormal    Collection Time: 01/23/24  6:24 AM  Result Value Ref Range    Glucose-Capillary 103 (H) 70 - 99 mg/dL    Comment 1 Notify RN      Comment 2 Document in Chart    Basic metabolic panel with GFR     Status: Abnormal    Collection Time: 01/23/24  7:39 AM  Result Value Ref Range    Sodium 142 135 - 145 mmol/L    Potassium 4.1 3.5 - 5.1 mmol/L    Chloride 103 98 - 111 mmol/L    CO2 21 (L) 22 - 32 mmol/L    Glucose, Bld 121 (H) 70 - 99 mg/dL    BUN 29 (H) 8 - 23 mg/dL    Creatinine, Ser 8.54 (H) 0.44 - 1.00 mg/dL    Calcium  9.4 8.9 - 10.3 mg/dL    GFR, Estimated 38 (L) >60 mL/min    Anion gap 18 (H) 5 - 15       Imaging Results (Last 48 hours)  MR BRAIN WO CONTRAST Result Date: 01/21/2024 CLINICAL DATA:  Stroke, follow up. EXAM: MRI HEAD WITHOUT CONTRAST TECHNIQUE: Multiplanar, multiecho pulse sequences of the brain and surrounding structures were obtained without intravenous contrast. COMPARISON:  Head CT, CTA, and CTP 01/21/2024 FINDINGS: The study is intermittently moderately motion degraded. Brain: There is a moderately large acute right PCA infarct involving much of the occipital lobe and portions of the medial right temporal lobe and right thalamus. There is associated cytotoxic edema without midline shift or other significant mass effect. Chronic microhemorrhages are noted in the right frontal and likely right  parietal lobes. T2 hyperintensities elsewhere in the cerebral white matter bilaterally are nonspecific but compatible with mild chronic small vessel ischemic disease. Moderate chronic small vessel changes are present in the pons. No mass or extra-axial fluid collection is evident. A partially empty sella is noted. Mild cerebral atrophy is within normal limits for age. Vascular: More fully evaluated on today's earlier CTA. Skull and upper cervical spine: Unremarkable bone marrow signal. Sinuses/Orbits: Bilateral cataract extraction. Trace right mastoid fluid. Minimal left ethmoid sinus mucosal thickening. Other: None. IMPRESSION: 1. Moderately large acute right PCA infarct. 2. Mild chronic small vessel ischemic disease. Electronically Signed   By: Dasie Hamburg M.D.   On: 01/21/2024 16:58    ECHOCARDIOGRAM COMPLETE Result Date: 01/21/2024    ECHOCARDIOGRAM REPORT   Patient Name:   JACOLYN JOAQUIN Date of Exam: 01/21/2024 Medical Rec #:  [id number]     Height:       63.0 in Accession #:    [id number]    Weight:       191.8 lb Date of Birth:  [birth year]    BSA:          1.900 m Patient Age:    78 years  BP:           183/97 mmHg Patient Gender: F             HR:           74 bpm. Exam Location:  Inpatient Procedure: 2D Echo, Cardiac Doppler, Color Doppler and Saline Contrast Bubble            Study (Both Spectral and Color Flow Doppler were utilized during            procedure). Indications:    Stroke  History:        Patient has prior history of Echocardiogram examinations, most                 recent 08/25/2021. Abnormal ECG, Stroke, Arrythmias:Atrial                 Fibrillation, Signs/Symptoms:Chest Pain; Risk                 Factors:Hypertension, Diabetes and Dyslipidemia.  Sonographer:    Ellouise Mose RDCS Referring Phys: [id number] JEFFREY C HATCHER IMPRESSIONS  1. Left ventricular ejection fraction, by estimation, is 60 to 65%. The left ventricle has normal function. The left ventricle has no regional wall motion  abnormalities. There is mild left ventricular hypertrophy. Left ventricular diastolic parameters are indeterminate.  2. Right ventricular systolic function is normal. The right ventricular size is normal. There is moderately elevated pulmonary artery systolic pressure. The estimated right ventricular systolic pressure is 56.2 mmHg.  3. The mitral valve is normal in structure. Trivial mitral valve regurgitation. No evidence of mitral stenosis.  4. Tricuspid valve regurgitation is moderate.  5. The aortic valve is tricuspid. Aortic valve regurgitation is not visualized. No aortic stenosis is present.  6. Agitated saline contrast bubble study was negative, with no evidence of any interatrial shunt. Comparison(s): No significant change from prior study. FINDINGS  Left Ventricle: Left ventricular ejection fraction, by estimation, is 60 to 65%. The left ventricle has normal function. The left ventricle has no regional wall motion abnormalities. The left ventricular internal cavity size was normal in size. There is  mild left ventricular hypertrophy. Left ventricular diastolic parameters are indeterminate. Right Ventricle: The right ventricular size is normal. No increase in right ventricular wall thickness. Right ventricular systolic function is normal. There is moderately elevated pulmonary artery systolic pressure. The tricuspid regurgitant velocity is 3.21 m/s, and with an assumed right atrial pressure of 15 mmHg, the estimated right ventricular systolic pressure is 56.2 mmHg. Left Atrium: Left atrial size was normal in size. Right Atrium: Right atrial size was normal in size. Pericardium: There is no evidence of pericardial effusion. Mitral Valve: The mitral valve is normal in structure. Mild mitral annular calcification. Trivial mitral valve regurgitation. No evidence of mitral valve stenosis. Tricuspid Valve: The tricuspid valve is normal in structure. Tricuspid valve regurgitation is moderate . No evidence of  tricuspid stenosis. Aortic Valve: The aortic valve is tricuspid. Aortic valve regurgitation is not visualized. No aortic stenosis is present. Pulmonic Valve: The pulmonic valve was normal in structure. Pulmonic valve regurgitation is mild. No evidence of pulmonic stenosis. Aorta: The aortic root and ascending aorta are structurally normal, with no evidence of dilitation. IAS/Shunts: No atrial level shunt detected by color flow Doppler. Agitated saline contrast was given intravenously to evaluate for intracardiac shunting. Agitated saline contrast bubble study was negative, with no evidence of any interatrial shunt.  LEFT VENTRICLE PLAX 2D LVIDd:  4.10 cm LVIDs:         2.80 cm LV PW:         1.20 cm LV IVS:        1.20 cm LVOT diam:     2.20 cm LV SV:         70 LV SV Index:   37 LVOT Area:     3.80 cm  LV Volumes (MOD) LV vol d, MOD A2C: 74.0 ml LV vol d, MOD A4C: 79.2 ml LV vol s, MOD A2C: 29.8 ml LV vol s, MOD A4C: 34.7 ml LV SV MOD A2C:     44.2 ml LV SV MOD A4C:     79.2 ml LV SV MOD BP:      44.1 ml RIGHT VENTRICLE            IVC RV S prime:     9.36 cm/s  IVC diam: 1.60 cm TAPSE (M-mode): 0.9 cm LEFT ATRIUM             Index        RIGHT ATRIUM           Index LA diam:        4.00 cm 2.11 cm/m   RA Area:     15.70 cm LA Vol (A2C):   50.1 ml 26.37 ml/m  RA Volume:   40.90 ml  21.53 ml/m LA Vol (A4C):   51.3 ml 27.01 ml/m LA Biplane Vol: 51.1 ml 26.90 ml/m  AORTIC VALVE LVOT Vmax:   92.00 cm/s LVOT Vmean:  59.000 cm/s LVOT VTI:    0.184 m  AORTA Ao Root diam: 2.70 cm Ao Asc diam:  3.20 cm MITRAL VALVE               TRICUSPID VALVE MV Area (PHT): 3.31 cm    TR Peak grad:   41.2 mmHg MV Decel Time: 229 msec    TR Vmax:        321.00 cm/s MV E velocity: 84.90 cm/s                            SHUNTS                            Systemic VTI:  0.18 m                            Systemic Diam: 2.20 cm Aditya Sabharwal Electronically signed by Ria Commander Signature Date/Time: 01/21/2024/3:56:26 PM     Final        Assessment/Plan: Diagnosis: Right PCA CVA including right thalamus Does the need for close, 24 hr/day medical supervision in concert with the patient's rehab needs make it unreasonable for this patient to be served in a less intensive setting? Yes Co-Morbidities requiring supervision/potential complications:  - A-fib, hypertension, hyperlipidemia, diabetes mellitus type 2, dysphagia, obesity, chronic low back pain, hypothyroidism, CKD 3B, hyperlipidemia   Due to bladder management, bowel management, safety, skin/wound care, disease management, medication administration, pain management, and patient education, does the patient require 24 hr/day rehab nursing? Yes Does the patient require coordinated care of a physician, rehab nurse, therapy disciplines of PT/OT to address physical and functional deficits in the context of the above medical diagnosis(es)? Yes Addressing deficits in the following areas: balance, endurance, locomotion, strength, transferring, bowel/bladder control, bathing, dressing, feeding, grooming,  toileting, swallowing, and psychosocial support Can the patient actively participate in an intensive therapy program of at least 3 hrs of therapy per day at least 5 days per week? Yes The potential for patient to make measurable gains while on inpatient rehab is excellent Anticipated functional outcomes upon discharge from inpatient rehab are modified independent and supervision  with PT, modified independent and supervision with OT, modified independent with SLP. Estimated rehab length of stay to reach the above functional goals is: 10-14 Anticipated discharge destination: Home Overall Rehab/Functional Prognosis: excellent   POST ACUTE RECOMMENDATIONS: This patient's condition is appropriate for continued rehabilitative care in the following setting: CIR Patient has agreed to participate in recommended program. Yes Note that insurance prior authorization may be required  for reimbursement for recommended care.   Comment: I think patient would be a good candidate for CIR, rehab coordinator follow-up.       I have personally performed a face to face diagnostic evaluation of this patient. Additionally, I have examined the patient's medical record including any pertinent labs and radiographic images.     Thanks,   Murray Collier, MD 01/23/2024       Revision History  Routing History

## 2024-01-24 NOTE — Progress Notes (Signed)
 Patient is discharged to CIR.  Room [id number],  patient is alert, oriented report  given to RN receiving patient.  Patient is comfortable, denies any pain upon discharge to Rehab.

## 2024-01-25 ENCOUNTER — Ambulatory Visit: Admitting: Endocrinology

## 2024-01-25 ENCOUNTER — Inpatient Hospital Stay (HOSPITAL_COMMUNITY)

## 2024-01-25 DIAGNOSIS — K5900 Constipation, unspecified: Secondary | ICD-10-CM

## 2024-01-25 DIAGNOSIS — I10 Essential (primary) hypertension: Secondary | ICD-10-CM

## 2024-01-25 DIAGNOSIS — N189 Chronic kidney disease, unspecified: Secondary | ICD-10-CM

## 2024-01-25 DIAGNOSIS — E1169 Type 2 diabetes mellitus with other specified complication: Secondary | ICD-10-CM

## 2024-01-25 DIAGNOSIS — Z794 Long term (current) use of insulin: Secondary | ICD-10-CM

## 2024-01-25 DIAGNOSIS — I63531 Cerebral infarction due to unspecified occlusion or stenosis of right posterior cerebral artery: Secondary | ICD-10-CM

## 2024-01-25 LAB — COMPREHENSIVE METABOLIC PANEL WITH GFR
ALT: 22 U/L (ref 0–44)
AST: 35 U/L (ref 15–41)
Albumin: 3.7 g/dL (ref 3.5–5.0)
Alkaline Phosphatase: 103 U/L (ref 38–126)
Anion gap: 11 (ref 5–15)
BUN: 23 mg/dL (ref 8–23)
CO2: 24 mmol/L (ref 22–32)
Calcium: 9.4 mg/dL (ref 8.9–10.3)
Chloride: 105 mmol/L (ref 98–111)
Creatinine, Ser: 1.5 mg/dL — ABNORMAL HIGH (ref 0.44–1.00)
GFR, Estimated: 37 mL/min — ABNORMAL LOW (ref >60)
Glucose, Bld: 162 mg/dL — ABNORMAL HIGH (ref 70–99)
Potassium: 4.1 mmol/L (ref 3.5–5.1)
Sodium: 140 mmol/L (ref 135–145)
Total Bilirubin: 0.7 mg/dL (ref 0.0–1.2)
Total Protein: 8.1 g/dL (ref 6.5–8.1)

## 2024-01-25 LAB — CBC WITH DIFFERENTIAL/PLATELET
Abs Immature Granulocytes: 0.01 K/uL (ref 0.00–0.07)
Basophils Absolute: 0 K/uL (ref 0.0–0.1)
Basophils Relative: 1 %
Eosinophils Absolute: 0 K/uL (ref 0.0–0.5)
Eosinophils Relative: 0 %
HCT: 43.7 % (ref 36.0–46.0)
Hemoglobin: 14 g/dL (ref 12.0–15.0)
Immature Granulocytes: 0 %
Lymphocytes Relative: 29 %
Lymphs Abs: 1.3 K/uL (ref 0.7–4.0)
MCH: 28.9 pg (ref 26.0–34.0)
MCHC: 32 g/dL (ref 30.0–36.0)
MCV: 90.1 fL (ref 80.0–100.0)
Monocytes Absolute: 0.4 K/uL (ref 0.1–1.0)
Monocytes Relative: 8 %
Neutro Abs: 2.8 K/uL (ref 1.7–7.7)
Neutrophils Relative %: 62 %
Platelets: 255 K/uL (ref 150–400)
RBC: 4.85 MIL/uL (ref 3.87–5.11)
RDW: 14.9 % (ref 11.5–15.5)
WBC: 4.5 K/uL (ref 4.0–10.5)
nRBC: 0 % (ref 0.0–0.2)

## 2024-01-25 LAB — GLUCOSE, CAPILLARY
Glucose-Capillary: 207 mg/dL — ABNORMAL HIGH (ref 70–99)
Glucose-Capillary: 135 mg/dL — ABNORMAL HIGH (ref 70–99)
Glucose-Capillary: 128 mg/dL — ABNORMAL HIGH (ref 70–99)
Glucose-Capillary: 149 mg/dL — ABNORMAL HIGH (ref 70–99)

## 2024-01-25 MED ORDER — POLYETHYLENE GLYCOL 3350 17 G PO PACK
17.0000 g | PACK | Freq: Every day | ORAL | Status: DC
  Administered 2024-01-25 – 2024-02-02 (×9): 17 g via ORAL
  Filled 2024-01-25 (×9): qty 1

## 2024-01-25 NOTE — Plan of Care (Signed)
  Problem: RH Problem Solving Goal: LTG Patient will demonstrate problem solving for (SLP) Description: LTG:  Patient will demonstrate problem solving for basic/complex daily situations with cues  (SLP) Flowsheets (Taken 01/25/2024 0932) LTG Patient will demonstrate problem solving for: Supervision   Problem: RH Memory Goal: LTG Patient will demonstrate ability for day to day (SLP) Description: LTG:   Patient will demonstrate ability for day to day recall/carryover during cognitive/linguistic activities with assist  (SLP) Flowsheets (Taken 01/25/2024 0932) LTG: Patient will demonstrate ability for day to day recall: New information LTG: Patient will demonstrate ability for day to day recall/carryover during cognitive/linguistic activities with assist (SLP): Supervision   Problem: RH Attention Goal: LTG Patient will demonstrate this level of attention during functional activites (SLP) Description: LTG:  Patient will will demonstrate this level of attention during functional activites (SLP) Flowsheets (Taken 01/25/2024 0932) Patient will demonstrate during cognitive/linguistic activities the attention type of: Sustained Patient will demonstrate this level of attention during cognitive/linguistic activities in: Controlled LTG: Patient will demonstrate this level of attention during cognitive/linguistic activities with assistance of (SLP): Supervision Number of minutes patient will demonstrate attention during cognitive/linguistic activities: 20 minutes

## 2024-01-25 NOTE — Progress Notes (Signed)
 Cornelio Bouchard, MD  Physician Physical Medicine and Rehabilitation   PMR Pre-admission    Signed   Date of Service: 01/23/2024  1:34 PM  Related encounter: ED to Hosp-Admission (Discharged) from 01/21/2024 in [facility]   Signed     Expand All Collapse All  PMR Admission Coordinator Pre-Admission Assessment   Patient: Megan Fischer is an 73 y.o., female MRN: [id number] DOB: 11-16-1950 Height: 5' 3 (160 cm) Weight: 87 kg                                                                                                                                                  Insurance Information HMO: yes    PPO:      PCP:      IPA:      80/20:      OTHER:  PRIMARY: [employer] Dual Complete      Policy#: [id number]      Subscriber: pt CM Name: portal approval       Phone#:      Fax#: [phone] Pre-Cert#: [id number]  auth for CIR from portal approval for admit 9/18 through 9/25.  Updates due at fax listed above.        Employer:  Benefits:  Phone #: [phone]     Name:  Eff. Date: 06/09/23     Deduct: $257 (met)      Out of Pocket Max: [phone] ($855.16 met)      Life Max:   CIR: $1565/admit      SNF: 20 full days Outpatient: 80%     Co-Pay: 20% Home Health: 100%      Co-Pay:  DME: 80%     Co-Pay: 20% Providers:  SECONDARY: Medicaid of [place]      Policy#: [id number]      Phone#: [phone]   Financial Counselor:       Phone#:    The "Data Collection Information Summary" for patients in Inpatient Rehabilitation Facilities with attached "Privacy Act Statement-Health Care Records" was provided and verbally reviewed with: Patient and Family   Emergency Contact Information Contact Information       Name Relation Home Work Mobile    [place] Daughter     [phone]    [place], [place]     [phone]         Other Contacts       Name Relation Home Work Mobile    [name],[name] Other     [phone]         Current Medical History  Patient  Admitting Diagnosis: CVA    History of Present Illness: Pt is a 73 y/o female with PMH of afib (on eliquis ), HTN, DM, OA with bilat TKA, R shoulder surgery who presented to [facility] on 01/21/24 after a fall at home with associated L sided weakness and  R gaze preference.  In ED pt was hypertensive but otherwise hemodynamically stable.  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 She was not a candidate for TNK and felt to be a poor candidate for thrombectomy given the established infarct and distal vessel location.  Neurology consulted and stroke workup revealed right acute PCA with chronic small vessel disease on MRI, 60-65% EF on echo with mild LVH, A1C 6.5, and LDL 123.  Recommendations were for Plavix  with transition to home eliquis  and addition of aspirin .  Therapy ongoing and pt was recommended for CIR.    Complete NIHSS TOTAL: 7 Glasgow Coma Scale Score: 15   Patient's medical record from [facility] has been reviewed by the rehabilitation admission coordinator and physician.   Past Medical History      Past Medical History:  Diagnosis Date   Atrial fibrillation (HCC)     Chronic disease anemia 03/29/2011   Diabetes mellitus     Dyslipidemia     Hypertension     Obesities, morbid (HCC)     Osteoarthritis            Has the patient had major surgery during 100 days prior to admission? No   Family History  family history includes Diabetes in her mother; Heart attack in her mother; Hypertension in her brother; Stroke in her mother.     Current Medications   Current Medications    Current Facility-Administered Medications:    acetaminophen  (TYLENOL ) tablet 650 mg, 650 mg, Oral, Q6H PRN, D'Mello, Rosalyn, DO, 650 mg at 01/24/24 1041   amLODipine  (NORVASC ) tablet 10 mg, 10 mg, Oral, Daily, D'Mello, Rosalyn, DO, 10 mg at 01/24/24 1010   apixaban  (ELIQUIS ) tablet 5 mg, 5 mg, Oral, BID, D'Mello, Rosalyn, DO, 5 mg at 01/24/24  1011   aspirin  EC tablet 81 mg, 81 mg, Oral, Daily, D'Mello, Rosalyn, DO, 81 mg at 01/24/24 1011   benazepril  (LOTENSIN ) tablet 20 mg, 20 mg, Oral, Daily, D'Mello, Rosalyn, DO, 20 mg at 01/24/24 1012   diclofenac  Sodium (VOLTAREN ) 1 % topical gel 2 g, 2 g, Topical, QID, Jolaine Pac, DO, 2 g at 01/24/24 1012   empagliflozin  (JARDIANCE ) tablet 25 mg, 25 mg, Oral, QAC breakfast, D'Mello, Rosalyn, DO, 25 mg at 01/24/24 1011   ezetimibe  (ZETIA ) tablet 10 mg, 10 mg, Oral, Daily, Jolaine Pac, DO, 10 mg at 01/24/24 1012   insulin  aspart (novoLOG ) injection 0-15 Units, 0-15 Units, Subcutaneous, TID WC, Goodwin, Joseph, DO, 3 Units at 01/24/24 1254   insulin  aspart (novoLOG ) injection 0-5 Units, 0-5 Units, Subcutaneous, QHS, Goodwin, Joseph, DO   methimazole  (TAPAZOLE ) tablet 5 mg, 5 mg, Oral, Daily, Jolaine Pac, DO, 5 mg at 01/24/24 1011   metoprolol  succinate (TOPROL -XL) 24 hr tablet 50 mg, 50 mg, Oral, Daily, Jerri Pfeiffer, MD, 50 mg at 01/24/24 1011   rosuvastatin  (CRESTOR ) tablet 20 mg, 20 mg, Oral, Daily, D'Mello, Rosalyn, DO, 20 mg at 01/24/24 1011     Patients Current Diet:  Diet Order                  Diet Carb Modified Fluid consistency: Thin; Room service appropriate? Yes  Diet effective now                         Precautions / Restrictions Precautions Precautions: Fall, Other (comment) Precaution/Restrictions Comments: L homonymous hemianopsia Restrictions Weight Bearing Restrictions Per Provider Order: No  Has the patient had 2 or more falls or a fall with injury in the past year?Yes   Prior Activity Level Limited Community (1-2x/wk): independent, occasional RW use, recently more unsteady (last week or 2), doesn't drive but no assist for ADLs/mobility/iADLs   Prior Functional Level Prior Function Prior Level of Function : Independent/Modified Independent Mobility Comments: cane PRN ADLs Comments: does not drive, ind ADLs, IADls (fiances and meds)   Self  Care: Did the patient need help bathing, dressing, using the toilet or eating?  Independent   Indoor Mobility: Did the patient need assistance with walking from room to room (with or without device)? Independent   Stairs: Did the patient need assistance with internal or external stairs (with or without device)? Independent   Functional Cognition: Did the patient need help planning regular tasks such as shopping or remembering to take medications? Independent   Patient Information Are you of Hispanic, Latino/a,or Spanish origin?: A. No, not of Hispanic, Latino/a, or Spanish origin What is your race?: B. Black or African American Do you need or want an interpreter to communicate with a doctor or health care staff?: 0. No   Patient's Response To:  Health Literacy and Transportation Is the patient able to respond to health literacy and transportation needs?: Yes Health Literacy - How often do you need to have someone help you when you read instructions, pamphlets, or other written material from your doctor or pharmacy?: Never In the past 12 months, has lack of transportation kept you from medical appointments or from getting medications?: No In the past 12 months, has lack of transportation kept you from meetings, work, or from getting things needed for daily living?: No   Journalist, newspaper / Equipment Home Equipment: Grab bars - tub/shower, Medical laboratory scientific officer - single point, Toilet riser   Prior Device Use: Indicate devices/aids used by the patient prior to current illness, exacerbation or injury? Walker   Current Functional Level Cognition   Orientation Level: Oriented X4, Oriented to person, Oriented to place, Oriented to time, Oriented to situation    Extremity Assessment (includes Sensation/Coordination)   Upper Extremity Assessment: LUE deficits/detail, Generalized weakness, Right hand dominant, RUE deficits/detail RUE Deficits / Details: shoulder flexion to ~100* with crepitus noted,  overall functional LUE Deficits / Details: grossly 3-/5, decreased propioception and awareness to UE, poor coodination and functional use. LUE Sensation: decreased light touch, decreased proprioception LUE Coordination: decreased fine motor, decreased gross motor  Lower Extremity Assessment: Defer to PT evaluation RLE Deficits / Details: WFL LLE Deficits / Details: Pt reports she has had 12 surgeries, on left lower extremity, tends to rest ankle in increased external rotation. Proximally, strength is 2-/5, distally ankle dorsiflexion 5/5 LLE Sensation: decreased proprioception (Sensation diminished on the left with extinction on double simultaneous stimulation)     ADLs   Overall ADL's : Needs assistance/impaired Grooming: Moderate assistance, Sitting Upper Body Dressing : Moderate assistance, Sitting Lower Body Dressing: Total assistance, +2 for physical assistance Toilet Transfer: Maximal assistance, +2 for safety/equipment, +2 for physical assistance Toilet Transfer Details (indicate cue type and reason): bil hand held to recliner towards R side Functional mobility during ADLs: Moderate assistance, Maximal assistance, +2 for physical assistance, Cueing for safety, Cueing for sequencing General ADL Comments: pt limited by L weakness, impaired vision and balance.  decreased awareness of deficits, poor initation and problem solvng     Mobility   Overal bed mobility: Needs Assistance Bed Mobility: Supine to Sit Supine to sit: Contact guard General  bed mobility comments: Pt exiting R side of bed with improved efficiency in compared to yesterday, cues for bringing R hip forward to edge     Transfers   Overall transfer level: Needs assistance Equipment used: Rolling walker (2 wheels), 2 person hand held assist Transfers: Sit to/from Stand, Bed to chair/wheelchair/BSC Sit to Stand: Max assist, From elevated surface Bed to/from chair/wheelchair/BSC transfer type:: Squat pivot Stand pivot  transfers: +2 physical assistance, Mod assist Squat pivot transfers: Max assist General transfer comment: Attempted to stand to RW with +1 assist and pt unable. Pt needs manual placement of feet shoulder width apart, facilitation to bring LUE up to RW and support, pushing off with RUE from chair arm with pt not being able to stand with only 1 person assist. Max assist of 1 to perform squat pivot transfer towards right with use of pad (+2 not available).     Ambulation / Gait / Stairs / Wheelchair Mobility   Ambulation/Gait General Gait Details: unable     Posture / Balance Dynamic Sitting Balance Sitting balance - Comments: Fair statically and needed min to CGA dynamically with pt performing reaching with cues and assist. Pt has difficulty looking to left without cues.  Family member commented that pts sitting balance was much better today. performed reaching tasks and exercises EOB for up to 15 min Balance Overall balance assessment: Needs assistance Sitting-balance support: Feet supported, No upper extremity supported Sitting balance-Leahy Scale: Fair Sitting balance - Comments: Fair statically and needed min to CGA dynamically with pt performing reaching with cues and assist. Pt has difficulty looking to left without cues.  Family member commented that pts sitting balance was much better today. performed reaching tasks and exercises EOB for up to 15 min Standing balance support: Bilateral upper extremity supported Standing balance-Leahy Scale: Poor Standing balance comment: Requiring max A     Special considerations/ Life events Diabetic management yes        Previous Home Environment (from acute therapy documentation) Living Arrangements: Alone Available Help at Discharge: Family, Available 24 hours/day Type of Home: Apartment Home Layout: One level Home Access: Level entry Bathroom Shower/Tub: Engineer, manufacturing systems: Standard Home Care Services: No Additional Comments:  senior living apt   Discharge Living Setting Plans for Discharge Living Setting: Patient's home, Lives with (comment) (son/DIL to stay with her) Type of Home at Discharge: Apartment Discharge Home Layout: One level Discharge Home Access: Level entry Discharge Bathroom Shower/Tub: Tub/shower unit Discharge Bathroom Toilet: Standard Discharge Bathroom Accessibility: Yes How Accessible: Accessible via walker Does the patient have any problems obtaining your medications?: No   Social/Family/Support Systems Anticipated Caregiver: son, DIL, and daughter Anticipated Caregiver's Contact Information: Daughter is primary contact:  Carl  [phone] Ability/Limitations of Caregiver: none stated Caregiver Availability: 24/7 Discharge Plan Discussed with Primary Caregiver: Yes Is Caregiver In Agreement with Plan?: Yes     Goals Patient/Family Goal for Rehab: PT/OT supervision to min assist, SLP supervision to mod I Expected length of stay: 15-18 days Additional Information: Discharge plan: return to pt's senior living apartment with 24/7 support from primarily her son/DIL, and her daughter as well Pt/Family Agrees to Admission and willing to participate: Yes Program Orientation Provided & Reviewed with Pt/Caregiver Including Roles  & Responsibilities: Yes     Decrease burden of Care through IP rehab admission: n/a     Possible need for SNF placement upon discharge: Not anticipated.  Plan to discharge back to pt's apartment with her son/DIL and daughter providing  24/7 supervision at discharge.      Patient Condition: This patient's condition remains as documented in the consult dated 01/23/24, in which the Rehabilitation Physician determined and documented that the patient's condition is appropriate for intensive rehabilitative care in an inpatient rehabilitation facility. Will admit to inpatient rehab today.   Preadmission Screen Completed By:  Reche FORBES Lowers, PT, DPT 01/24/2024 3:15  PM ______________________________________________________________________   Discussed status with Dr. Lovorn on 01/24/24 at 3:15 PM  and received approval for admission today.   Admission Coordinator:  Nai Borromeo E Dylen Mcelhannon, time 3:15 PM Pattricia 01/24/24             Revision History

## 2024-01-25 NOTE — Progress Notes (Signed)
 Inpatient Rehabilitation Care Coordinator Assessment and Plan Patient Details  Name: Megan Fischer MRN: [id number] Date of Birth: May 14, 1950  Today's Date: 01/25/2024  Hospital Problems: Principal Problem:   Acute ischemic right PCA stroke [facility])  Past Medical History:  Past Medical History:  Diagnosis Date   Atrial fibrillation (HCC)    Chronic disease anemia 03/29/2011   Diabetes mellitus    Dyslipidemia    Hypertension    Obesities, morbid (HCC)    Osteoarthritis    Past Surgical History:  Past Surgical History:  Procedure Laterality Date   CHOLECYSTECTOMY     REPLACEMENT TOTAL KNEE BILATERAL     SHOULDER SURGERY     RIGHT SHOULDER   TUBAL LIGATION     Social History:  reports that she has never smoked. She has never used smokeless tobacco. She reports that she does not drink alcohol and does not use drugs.  Family / Support Systems Marital Status: Single Patient Roles: Parent, Other (Comment) (retiree) Children: Natasha-daughter [phone]  Marjorie [phone] Other Supports: kameron-daughter in-law  [phone] Anticipated Caregiver: Son, daughter in-law and daughter Ability/Limitations of Caregiver: Between them can provide 24/7 care, will only be for a short time Caregiver Availability: 24/7 Family Dynamics: Close with children and daughter in-law who are involved and will be assisting pt at discharge. Pt hopes to be mod/i and not need assist she is not one to accept help and wants to be independent  Social History Preferred language: English Religion: Holiness Cultural Background: NA Education: HS Health Literacy - How often do you need to have someone help you when you read instructions, pamphlets, or other written material from your doctor or pharmacy?: Never Writes: Yes Employment Status: Retired Marine scientist Issues: NA Guardian/Conservator: None-according to MD pt is capable of making her own decisions while here    Abuse/Neglect Abuse/Neglect Assessment Can Be Completed: Yes Physical Abuse: Denies Verbal Abuse: Denies Sexual Abuse: Denies Exploitation of patient/patient's resources: Denies Self-Neglect: Denies  Patient response to: Social Isolation - How often do you feel lonely or isolated from those around you?: Never  Emotional Status Pt's affect, behavior and adjustment status: Pt is motivated to do well and recover from her stroke she is making progress and hopes to get to mod/i level. She was active prior to admission and likes living alone Recent Psychosocial Issues: other health issues Psychiatric History: No history seems to be coping appropriately and verbalizes her concerns and feelings regarding her being in the hospital. Substance Abuse History: NA  Patient / Family Perceptions, Expectations & Goals Pt/Family understanding of illness & functional limitations: Pt and family are able to explain her stroke and deficits, she is progressing and hopes to be independent by discharge. All have spoken with the MD and feel they are aware of her plan moving forward. Premorbid pt/family roles/activities: mom, retiree, neighbor, etc Anticipated changes in roles/activities/participation: resume Pt/family expectations/goals: Pt states:  I want to be independent when I leave here.  Daughter in-law states:  We will help her but know how independent she is.  Community Resources [employer]: Other (Comment) (Lives in senior living apartment) Premorbid Home Care/DME Agencies: None Transportation available at discharge: doesn;t drive Is the patient able to respond to transportation needs?: Yes In the past 12 months, has lack of transportation kept you from medical appointments or from getting medications?: No In the past 12 months, has lack of transportation kept you from meetings, work, or from getting things needed for daily living?: No  Discharge Planning  Living Arrangements:  Alone Support Systems: Children, Other relatives, Friends/neighbors, Church/faith community Type of Residence: Private residence Insurance Resources: Media planner (specify) ([employer] Medicare dual complete) Financial Resources: Restaurant manager, fast food Screen Referred: No Living Expenses: Rent Money Management: Patient Does the patient have any problems obtaining your medications?: No Home Management: self Patient/Family Preliminary Plans: Return home to her apartment with family or go to son's home. She will have 24/7 care for a short time and then hopefully will be mod/i level. Aware being evaluated and goals being set for stay here. Daughter in-law is present today for therapies Care Coordinator Barriers to Discharge: Insurance for SNF coverage Care Coordinator Anticipated Follow Up Needs: HH/OP  Clinical Impression Pleasant female who is motivated and used to doing for herself she is not one to want assist. Her children and daughter in-law are very supportive and will be assisting at discharge. Will await team's evaluations.  Raymonde Asberry MATSU 01/25/2024, 12:04 PM

## 2024-01-25 NOTE — Evaluation (Signed)
 Occupational Therapy Assessment and Plan  Patient Details  Name: Megan Fischer MRN: [id number] Date of Birth: 1950-09-10  OT Diagnosis: hemiplegia affecting non-dominant side Rehab Potential: Rehab Potential (ACUTE ONLY): Good ELOS: 3-4 weeks   Today's Date: 01/25/2024 OT Individual Time: 9069-8896 OT Individual Time Calculation (min): 93 min     Hospital Problem: Principal Problem:   Acute ischemic right PCA stroke [facility])   Past Medical History:  Past Medical History:  Diagnosis Date   Atrial fibrillation (HCC)    Chronic disease anemia 03/29/2011   Diabetes mellitus    Dyslipidemia    Hypertension    Obesities, morbid (HCC)    Osteoarthritis    Past Surgical History:  Past Surgical History:  Procedure Laterality Date   CHOLECYSTECTOMY     REPLACEMENT TOTAL KNEE BILATERAL     SHOULDER SURGERY     RIGHT SHOULDER   TUBAL LIGATION      Assessment & Plan Clinical Impression: Patient is a 73 y.o. year old female with recent admission to the hospital on 01/21/24 after a fall at home with associated L sided weakness and R gaze preference. In ED pt was hypertensive but otherwise hemodynamically stable. 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  Patient transferred to CIR on 01/24/2024 .    Patient currently requires total with basic self-care skills secondary to muscle weakness, impaired timing and sequencing and decreased coordination, and field cut.  Prior to hospitalization, patient could complete IADL without assistance .  Patient will benefit from skilled intervention to increase independence with basic self-care skills prior to discharge home with care partner.  Anticipate patient will require 24 hour supervision and follow up home health.  OT - End of Session Activity Tolerance: Tolerates 10 - 20 min activity with multiple rests Endurance Deficit: Yes Endurance Deficit Description: Patient needing frequent rests  during ADL's to ensure safe transfer and improved independence but frequently leaning back from sitting EOB to rest. OT Assessment Rehab Potential (ACUTE ONLY): Good OT Barriers to Discharge: None OT Patient demonstrates impairments in the following area(s): Balance;Safety;Sensory;Vision;Endurance;Motor;Perception OT Basic ADL's Functional Problem(s): Grooming;Bathing;Dressing;Toileting OT Advanced ADL's Functional Problem(s): Simple Meal Preparation OT Transfers Functional Problem(s): Toilet;Tub/Shower OT Additional Impairment(s): Fuctional Use of Upper Extremity OT Plan OT Intensity: Minimum of 1-2 x/day, 45 to 90 minutes OT Frequency: 5 out of 7 days OT Duration/Estimated Length of Stay: 3-4 weeks OT Treatment/Interventions: Balance/vestibular training;Discharge planning;Pain management;Self Care/advanced ADL retraining;Therapeutic Activities;UE/LE Coordination activities;Disease mangement/prevention;Functional mobility training;Patient/family education;Therapeutic Exercise;Visual/perceptual remediation/compensation;Wheelchair propulsion/positioning;UE/LE Strength taining/ROM;Neuromuscular re-education;DME/adaptive equipment instruction;Community reintegration OT Self Feeding Anticipated Outcome(s): Independent OT Basic Self-Care Anticipated Outcome(s): Independent OT Toileting Anticipated Outcome(s): CGA OT Bathroom Transfers Anticipated Outcome(s): CGA OT Recommendation Recommendations for Other Services: Therapeutic Recreation consult Therapeutic Recreation Interventions: Kitchen group;Outing/community reintergration Patient destination: Home Follow Up Recommendations: Home health OT Equipment Recommended: 3 in 1 bedside comode;Tub/shower bench;To be determined Equipment Details: Patient lives in senior housing and may be able to have grab bars added through the facility.   OT Evaluation Precautions/Restrictions  Precautions Precautions: Fall Recall of Precautions/Restrictions:  Impaired Precaution/Restrictions Comments: L homonymous hemianopsia, B TKA's pending revision per family Restrictions Weight Bearing Restrictions Per Provider Order: No General Chart Reviewed: Yes Family/Caregiver Present: Yes Vital Signs Therapy Vitals Pulse Rate: 77 BP: (!) 141/85 Patient Position (if appropriate): Lying Oxygen Therapy SpO2: 100 % Pain Pain Assessment Pain Scale: 0-10 Pain Score: 0-No pain Faces Pain Scale: No hurt Home Living/Prior Functioning Home Living  Family/patient expects to be discharged to:: Private residence Living Arrangements: Alone Available Help at Discharge: Family, Available 24 hours/day Type of Home: Apartment Home Access: Level entry Home Layout: One level Bathroom Shower/Tub: Associate Professor: Yes Additional Comments: senior living apt  Lives With: Alone IADL History Homemaking Responsibilities: Yes Education: 12th grade Prior Function Level of Independence: Independent with basic ADLs, Independent with gait Vocation: Retired Administrator, sports Baseline Vision/History: 1 Wears glasses Ability to See in Adequate Light: 1 Impaired Patient Visual Report: Peripheral vision impairment Vision Assessment?: Vision impaired- to be further tested in functional context Ocular Range of Motion: Restricted on the left;Impaired-to be further tested in functional context Alignment/Gaze Preference: Gaze right Visual Fields: Left homonymous hemianopsia Perception  Perception: Impaired Praxis Praxis: Impaired Praxis Impairment Details: Motor planning Cognition Cognition Overall Cognitive Status: Within Functional Limits for tasks assessed Arousal/Alertness: Awake/alert Orientation Level: Person;Situation;Place Person: Oriented Place: Oriented Situation: Oriented Memory: Impaired Memory Impairment: Retrieval deficit Attention: Sustained Sustained Attention: Appears intact Problem Solving:  Impaired Problem Solving Impairment: Verbal complex;Functional complex Executive Function: Sequencing;Organizing Sequencing: Impaired Sequencing Impairment: Verbal complex;Functional complex Organizing: Impaired Organizing Impairment: Verbal complex;Functional complex Safety/Judgment: Impaired Comments: Patient stating she feels safe to go home. When asked how she would perform basic tasks safely she did not appear to understand her current level of assist and risk for falling. Brief Interview for Mental Status (BIMS) Repetition of Three Words (First Attempt): 1 Temporal Orientation: Year: Correct Temporal Orientation: Month: Accurate within 5 days Temporal Orientation: Day: Incorrect Recall: Sock: Yes, after cueing (something to wear) Recall: Blue: Yes, after cueing (a color) Recall: Bed: Yes, no cue required BIMS Summary Score: 10 Sensation Sensation Light Touch: Impaired by gross assessment Hot/Cold: Appears Intact Proprioception: Impaired by gross assessment Stereognosis: Impaired by gross assessment Coordination Gross Motor Movements are Fluid and Coordinated: No Fine Motor Movements are Fluid and Coordinated: No Motor  Motor Motor: Hemiplegia  Trunk/Postural Assessment     Balance Balance Balance Assessed: Yes Dynamic Sitting Balance Dynamic Sitting - Balance Support: Feet supported;During functional activity Dynamic Sitting - Level of Assistance: 4: Min assist Dynamic Sitting - Balance Activities: Reaching for objects Static Standing Balance Static Standing - Balance Support: Bilateral upper extremity supported Static Standing - Level of Assistance: 2: Max assist Static Standing - Comment/# of Minutes: Patient with significant forward lean requiring max assist to prevent falling forward in addition to blocking LLE 2/2 weakness Extremity/Trunk Assessment RUE Assessment RUE Assessment: Within Functional Limits General Strength Comments: Has some OA changes  but able to perform functional tasks LUE Assessment Passive Range of Motion (PROM) Comments: PROM/AAROM tolerated to 150 degrees without report of pain Active Range of Motion (AROM) Comments: Patient limited to 100 degrees active shoulder flexion without pain due to compensation General Strength Comments: 3/5 grossly in LUE  Care Tool Care Tool Self Care Eating   Eating Assist Level: Set up assist    Oral Care    Oral Care Assist Level: Set up assist    Bathing   Body parts bathed by patient: Left arm;Chest;Front perineal area;Right upper leg;Left upper leg;Face Body parts bathed by helper: Abdomen;Right arm;Buttocks;Right lower leg;Left lower leg   Assist Level: Maximal Assistance - Patient 24 - 49%    Upper Body Dressing(including orthotics)   What is the patient wearing?: Hospital gown only   Assist Level: Maximal Assistance - Patient 25 - 49%    Lower Body Dressing (excluding footwear)   What is the patient wearing?: Pants;Incontinence brief  Assist for lower body dressing: Dependent - Patient 0%    Putting on/Taking off footwear   What is the patient wearing?: Non-skid slipper socks Assist for footwear: Dependent - Patient 0%       Care Tool Toileting Toileting activity   Assist for toileting: Dependent - Patient 0%     Care Tool Bed Mobility Roll left and right activity   Roll left and right assist level: Moderate Assistance - Patient 50 - 74%    Sit to lying activity   Sit to lying assist level: Moderate Assistance - Patient 50 - 74%    Lying to sitting on side of bed activity   Lying to sitting on side of bed assist level: the ability to move from lying on the back to sitting on the side of the bed with no back support.: Moderate Assistance - Patient 50 - 74%     Care Tool Transfers Sit to stand transfer   Sit to stand assist level: 2 Helpers    Chair/bed transfer   Chair/bed transfer assist level: 2 Software engineer transfer   Assist Level: 2  Helpers     Care Tool Cognition  Expression of Ideas and Wants Expression of Ideas and Wants: 4. Without difficulty (complex and basic) - expresses complex messages without difficulty and with speech that is clear and easy to understand  Understanding Verbal and Non-Verbal Content Understanding Verbal and Non-Verbal Content: 4. Understands (complex and basic) - clear comprehension without cues or repetitions   Memory/Recall Ability Memory/Recall Ability : Current season;That he or she is in a hospital/hospital unit   Refer to Care Plan for Long Term Goals  SHORT TERM GOAL WEEK 1 OT Short Term Goal 1 (Week 1): Patient to perform upper body dressing with Min assist OT Short Term Goal 2 (Week 1): Patient to perform toilet transfer with Mod assist of one OT Short Term Goal 3 (Week 1): Patient to perform shower seated with Mod assist and AE as needed  Recommendations for other services: Adult nurse group and Outing/community reintegration   Skilled Therapeutic Intervention ADL ADL Eating: Set up Where Assessed-Eating: Bed level Grooming: Setup Where Assessed-Grooming: Sitting at sink Upper Body Bathing: Moderate assistance Where Assessed-Upper Body Bathing: Edge of bed Lower Body Bathing: Maximal assistance Where Assessed-Lower Body Bathing: Edge of bed Upper Body Dressing: Maximal assistance Where Assessed-Upper Body Dressing: Edge of bed Lower Body Dressing: Dependent Where Assessed-Lower Body Dressing: Edge of bed Toileting: Dependent Where Assessed-Toileting: Teacher, adult education: Dependent Statistician Method: Ambulance person: Extra wide drop arm bedside commode;Grab bars Tub/Shower Transfer: Unable to assess Tub/Shower Transfer Method: Unable to assess Film/video editor: Unable to assess [employer] Method: Unable to assess ADL Comments: Patient needing increased cues to stay on task and due to L field cut.  Required some hand over hand and assist of 2 for safe ADL performance Mobility  Transfers Sit to Stand: 2 Helpers;Total Assistance - Patient < 25%   Discharge Criteria: Patient will be discharged from OT if patient refuses treatment 3 consecutive times without medical reason, if treatment goals not met, if there is a change in medical status, if patient makes no progress towards goals or if patient is discharged from hospital.  The above assessment, treatment plan, treatment alternatives and goals were discussed and mutually agreed upon: by patient and by family  [name] 01/25/2024, 11:32 AM

## 2024-01-25 NOTE — Evaluation (Signed)
 Physical Therapy Assessment and Plan  Patient Details  Name: Megan Fischer MRN: [id number] Date of Birth: [birth year]  PT Diagnosis: Abnormal posture, Abnormality of gait, Difficulty walking, Hemiplegia non-dominant, Impaired sensation, and Muscle weakness Rehab Potential: Fair ELOS: 2 weeks   Today's Date: 01/25/2024 PT Individual Time: 8699-8587 PT Individual Time Calculation (min): 72 min    Hospital Problem: Principal Problem:   Acute ischemic right PCA stroke [facility])   Past Medical History:  Past Medical History:  Diagnosis Date   Atrial fibrillation (HCC)    Chronic disease anemia 03/29/2011   Diabetes mellitus    Dyslipidemia    Hypertension    Obesities, morbid (HCC)    Osteoarthritis    Past Surgical History:  Past Surgical History:  Procedure Laterality Date   CHOLECYSTECTOMY     REPLACEMENT TOTAL KNEE BILATERAL     SHOULDER SURGERY     RIGHT SHOULDER   TUBAL LIGATION      Assessment & Plan Clinical Impression: Patient is a 73 year old female with history of CAF, T2DM, giant cell bone tumor who was admitted on 01/21/24 with left sided weakness, left facial weakness and L-HH. CT head showed moderately large acute to subacute PCA infarct. MRI brain showed large acute R-PCA infarct involving much of occipital lobe and portions of medial right temporal and right thalamus. CTA head/neck showed occlusion of R-ICA with intra-cranial reconstitution and acute R-PCA infarct with 33 ml penumbra. 2 D echo showed EF 60-65% with no wall etiology. Patient has ASA allergy and Neurology recommended Plavix  with plans to resume Eliquis  on 09/18. Low dose ECASA also added additionally and Crestor  increased from 10-20 mg due to hyperlipidemia. Amlodipine  was added for blood pressure control. Reports last bowel movement was about 2 days ago. PT/OT consulted and patient impaired by visual deficits as well as left sided weakness with sensory deficits. She requires +2 mod assist for transfers,  max assist with sitting balance and mod assist +2 with ADLs. She was independent til 1-2 months PTA. She has been having falls and son/daughter in law have been assist PTA. She has been using a walker for a couple of months but limited distances and perform ADLs. CIR recommended due to functional decline  Patient transferred to CIR on 01/24/2024 .   Patient currently requires mod with mobility secondary to muscle weakness, decreased cardiorespiratoy endurance, impaired timing and sequencing and unbalanced muscle activation, decreased visual acuity, decreased visual perceptual skills, and field cut, decreased attention to left, decreased attention, decreased awareness, decreased problem solving, and decreased safety awareness, and decreased standing balance, decreased postural control, hemiplegia, and decreased balance strategies.  Prior to hospitalization, patient was modified independent  with mobility and lived with Alone in a Apartment (senior living) home.  Home access is  Level entry.  Patient will benefit from skilled PT intervention to maximize safe functional mobility, minimize fall risk, and decrease caregiver burden for planned discharge home with 24 hour supervision.  Anticipate patient will benefit from follow up HH at discharge.  PT - End of Session Activity Tolerance: Tolerates < 10 min activity, no significant change in vital signs Endurance Deficit: Yes Endurance Deficit Description: Patient needing several frequent rests during functional mobility testing PT Assessment Rehab Potential (ACUTE/IP ONLY): Fair PT Barriers to Discharge: Decreased caregiver support;Home environment access/layout;Lack of/limited family support;Insurance for SNF coverage;Weight;Medication compliance PT Patient demonstrates impairments in the following area(s): Balance;Endurance;Motor;Perception;Safety;Sensory;Skin Integrity PT Transfers Functional Problem(s): Bed Mobility;Bed to Chair;Car PT Locomotion  Functional Problem(s): Ambulation;Wheelchair Mobility;Stairs  PT Plan PT Intensity: Minimum of 1-2 x/day ,45 to 90 minutes PT Frequency: 5 out of 7 days PT Duration Estimated Length of Stay: 2 weeks PT Treatment/Interventions: Ambulation/gait training;Cognitive remediation/compensation;Discharge planning;DME/adaptive equipment instruction;Functional mobility training;Pain management;Psychosocial support;Splinting/orthotics;Therapeutic Activities;UE/LE Strength taining/ROM;Visual/perceptual remediation/compensation;Therapeutic Exercise;Wheelchair propulsion/positioning;UE/LE Coordination activities;Skin care/wound management;Stair training;Patient/family education;Neuromuscular re-education;Functional electrical stimulation;Disease management/prevention;Community reintegration;Balance/vestibular training PT Transfers Anticipated Outcome(s): CGA PT Locomotion Anticipated Outcome(s): CGA PT Recommendation Recommendations for Other Services: Neuropsych consult Follow Up Recommendations: Home health PT;24 hour supervision/assistance Patient destination: Home Equipment Recommended: To be determined   PT Evaluation Precautions/Restrictions Precautions Precautions: Fall Recall of Precautions/Restrictions: Impaired Precaution/Restrictions Comments: L homonymous hemianopsia, L hemi, B TKA's pending revision per family Restrictions Weight Bearing Restrictions Per Provider Order: No General   Vital Signs  Pain Pain Assessment Pain Scale: 0-10 Pain Score: 0-No pain Faces Pain Scale: No hurt Pain Interference Pain Interference Pain Effect on Sleep: 0. Does not apply - I have not had any pain or hurting in the past 5 days Pain Interference with Therapy Activities: 0. Does not apply - I have not received rehabilitationtherapy in the past 5 days Pain Interference with Day-to-Day Activities: 1. Rarely or not at all Home Living/Prior Functioning Home Living Living Arrangements: Alone Available Help  at Discharge: Family;Available 24 hours/day (son and daughter in law) Type of Home: Apartment (senior living) Home Access: Level entry Home Layout: One level Bathroom Shower/Tub: Engineer, manufacturing systems: Standard Bathroom Accessibility: Yes Additional Comments: senior living apt  Lives With: Alone Prior Function Level of Independence: Independent with basic ADLs;Independent with gait;Independent with transfers  Able to Take Stairs?: Yes Driving: No (son drives her) Vocation: Retired Optometrist - History Ability to See in Adequate Light: 1 Impaired Vision - Assessment Ocular Range of Motion: Restricted on the left;Impaired-to be further tested in functional context Alignment/Gaze Preference: Gaze right Tracking/Visual Pursuits: Decreased smoothness of horizontal tracking;Decreased smoothness of vertical tracking Saccades: Overshoots;Impaired - to be further tested in functional context Convergence: Impaired - to be further tested in functional context Perception Perception: Impaired Preception Impairment Details: Inattention/Neglect Perception-Other Comments: L side Praxis Praxis: Impaired Praxis Impairment Details: Motor planning  Cognition Overall Cognitive Status: Within Functional Limits for tasks assessed Arousal/Alertness: Awake/alert Attention: Sustained Sustained Attention: Appears intact Memory: Impaired Memory Impairment: Retrieval deficit Problem Solving: Impaired Safety/Judgment: Impaired Comments: Patient stating she feels safe to go home. When asked how she would perform basic tasks safely she did not appear to understand her current level of assist and risk for falling. Sensation Sensation Light Touch: Impaired Detail Light Touch Impaired Details: Impaired LLE;Impaired LUE Hot/Cold: Appears Intact Proprioception: Impaired Detail Proprioception Impaired Details: Impaired LLE;Impaired LUE Stereognosis: Impaired by gross  assessment Coordination Gross Motor Movements are Fluid and Coordinated: No Fine Motor Movements are Fluid and Coordinated: No Coordination and Movement Description: L hemi with sensory defcits Motor  Motor Motor: Hemiplegia;Abnormal postural alignment and control   Trunk/Postural Assessment  Cervical Assessment Cervical Assessment: Exceptions to [facility] (forward head) Thoracic Assessment Thoracic Assessment: Exceptions to [facility] (rounded shuolders) Lumbar Assessment Lumbar Assessment: Exceptions to [facility] (posterior tilt in sitting) Postural Control Postural Control: Deficits on evaluation Trunk Control: posterior bias Righting Reactions: delayed  Balance Balance Balance Assessed: Yes Dynamic Sitting Balance Dynamic Sitting - Balance Support: Feet supported;During functional activity Dynamic Sitting - Level of Assistance: 4: Min assist Dynamic Sitting - Balance Activities: Reaching for objects Static Standing Balance Static Standing - Balance Support: Bilateral upper extremity supported Static Standing - Level of Assistance: 2: Max assist Static  Standing - Comment/# of Minutes: Patient with significant forward lean requiring max assist to prevent falling forward in addition to blocking LLE 2/2 weakness Extremity Assessment  RUE Assessment RUE Assessment: Within Functional Limits General Strength Comments: Has some OA changes but able to perform functional tasks LUE Assessment Passive Range of Motion (PROM) Comments: PROM/AAROM tolerated to 150 degrees without report of pain Active Range of Motion (AROM) Comments: Patient limited to 100 degrees active shoulder flexion without pain due to compensation General Strength Comments: 3/5 grossly in LUE RLE Assessment RLE Assessment: Within Functional Limits LLE Assessment LLE Assessment: Exceptions to [facility] LLE Strength LLE Overall Strength: Deficits Left Hip Flexion: 2/5 Left Hip ABduction: 2/5 Left Hip ADduction: 2/5 Left Knee Flexion:  2-/5 Left Knee Extension: 2-/5 Left Ankle Dorsiflexion: 3/5 Left Ankle Plantar Flexion: 2+/5  Care Tool Care Tool Bed Mobility Roll left and right activity   Roll left and right assist level: Supervision/Verbal cueing    Sit to lying activity   Sit to lying assist level: Minimal Assistance - Patient > 75%    Lying to sitting on side of bed activity   Lying to sitting on side of bed assist level: the ability to move from lying on the back to sitting on the side of the bed with no back support.: Minimal Assistance - Patient > 75%     Care Tool Transfers Sit to stand transfer   Sit to stand assist level: Moderate Assistance - Patient 50 - 74%    Chair/bed transfer   Chair/bed transfer assist level: Moderate Assistance - Patient 50 - 74%    Car transfer   Car transfer assist level: Maximal Assistance - Patient 25 - 49%      Care Tool Locomotion Ambulation   Assist level: 2 helpers (modA with w/c follow for safety) Assistive device: Other (comment) (R hand rail) Max distance: 30'  Walk 10 feet activity   Assist level: 2 helpers (modA with w/c follow for safety) Assistive device: Other (comment) (R hand rail)   Walk 50 feet with 2 turns activity Walk 50 feet with 2 turns activity did not occur: Safety/medical concerns      Walk 150 feet activity Walk 150 feet activity did not occur: Safety/medical concerns      Walk 10 feet on uneven surfaces activity Walk 10 feet on uneven surfaces activity did not occur: Safety/medical concerns      Stairs Stair activity did not occur: Safety/medical concerns        Walk up/down 1 step activity Walk up/down 1 step or curb (drop down) activity did not occur: Safety/medical concerns      Walk up/down 4 steps activity Walk up/down 4 steps activity did not occur: Safety/medical concerns      Walk up/down 12 steps activity Walk up/down 12 steps activity did not occur: Safety/medical concerns      Pick up small objects from floor Pick  up small object from the floor (from standing position) activity did not occur: Safety/medical concerns      Wheelchair Is the patient using a wheelchair?: Yes Type of Wheelchair: Manual   Wheelchair assist level: Moderate Assistance - Patient 50 - 74% Max wheelchair distance: 35'  Wheel 50 feet with 2 turns activity   Assist Level: Moderate Assistance - Patient 50 - 74%  Wheel 150 feet activity   Assist Level: Total Assistance - Patient < 25%    Refer to Care Plan for Long Term Goals  SHORT TERM GOAL WEEK  1 PT Short Term Goal 1 (Week 1): Pt will complete bed mobility with CGA PT Short Term Goal 2 (Week 1): Pt will complete bed<>chair transfers with minA and LRAD PT Short Term Goal 3 (Week 1): Pt will ambulate 20ft with minA and LRAD PT Short Term Goal 4 (Week 1): Pt will complete functional outcome measure to assess falls risk  Recommendations for other services: Neuropsych  Skilled Therapeutic Intervention Mobility Bed Mobility Bed Mobility: Rolling Right;Rolling Left;Supine to Sit;Sit to Supine Rolling Right: Supervision/verbal cueing Rolling Left: Supervision/Verbal cueing Supine to Sit: Contact Guard/Touching assist Sit to Supine: Contact Guard/Touching assist Transfers Transfers: Sit to Stand;Stand to Sit;Stand Pivot Transfers Sit to Stand: Moderate Assistance - Patient 50-74% Stand to Sit: Moderate Assistance - Patient 50-74% Stand Pivot Transfers: Moderate Assistance - Patient 50 - 74% Stand Pivot Transfer Details: Tactile cues for initiation;Tactile cues for weight shifting;Verbal cues for technique;Verbal cues for gait pattern;Verbal cues for precautions/safety;Verbal cues for safe use of DME/AE;Manual facilitation for weight shifting;Visual cues/gestures for precautions/safety;Visual cues/gestures for sequencing Squat Pivot Transfers: Total Assistance - Patient < 25% Transfer (Assistive device): 1 person hand held assist Locomotion  Gait Ambulation: Yes Gait  Assistance: 2 Helpers;Moderate Assistance - Patient 50-74% (modA with w/c follow for safety) Gait Distance (Feet): 30 Feet Assistive device: Other (Comment) (R hand rail) Gait Assistance Details: Tactile cues for initiation;Tactile cues for weight shifting;Tactile cues for sequencing;Tactile cues for posture;Verbal cues for technique;Verbal cues for gait pattern;Manual facilitation for weight shifting;Verbal cues for safe use of DME/AE;Verbal cues for precautions/safety;Manual facilitation for placement Gait Gait: Yes Gait Pattern: Impaired Gait Pattern: Step-through pattern;Decreased weight shift to right;Trunk flexed;Poor foot clearance - left;Narrow base of support;Left genu recurvatum;Decreased dorsiflexion - left;Decreased hip/knee flexion - left;Decreased stance time - left Stairs / Additional Locomotion Stairs: No Wheelchair Mobility Wheelchair Mobility: Yes Wheelchair Assistance: Moderate Assistance - Patient 50 - 74% Wheelchair Propulsion: Both upper extremities Wheelchair Parts Management: Needs assistance Distance: 25'  Treatment: Pt in bed to start - agreeable to PT treatment. She has no reports of resting pain or pain within the last 5 days. She's oriented x4, can be jovial and redirected when need be.   Functional mobility completed as outlined above. Overall, required minA for bed mobility, modA for squat/stand pivot transfers to her R side, maxA for squat/stand pivot transfers to her L side, ambulated 28ft using R hand rail with modA and +2 for w/c follow for safety, and needed maxA for completing the car transfer. Used Stedy with +2 min/modA for transferring her onto the toilet due to urgency where she was continent of bladder void. Left in direct care of LPN.   She presents primarily with deficits in vision, attention, L sided weakness with sensory involvement, impaired activity tolerance and generalized deconditioning. Her balance and awareness is poor, resulting in high  falls risk. Anticipate need for 24/7 care given her vision and attention deficits.   Instructed pt in results of PT evaluation as detailed above, PT POC, rehab potential, rehab goals, and discharge recommendations. Additionally discussed CIR's policies regarding fall safety and use of chair alarm and/or quick release belt. Pt verbalized understanding and in agreement. Will update pt's family members as they become available.   Discharge Criteria: Patient will be discharged from PT if patient refuses treatment 3 consecutive times without medical reason, if treatment goals not met, if there is a change in medical status, if patient makes no progress towards goals or if patient is discharged from hospital.  The above  assessment, treatment plan, treatment alternatives and goals were discussed and mutually agreed upon: by patient  Sherlean SHAUNNA Perks  PT, DPT, CSRS  01/25/2024, 1:50 PM

## 2024-01-25 NOTE — Progress Notes (Signed)
 Inpatient Rehabilitation Admission Medication Review by a Pharmacist  A complete drug regimen review was completed for this patient to identify any potential clinically significant medication issues.  High Risk Drug Classes Is patient taking? Indication by Medication  Antipsychotic Yes Compazine  prn N/V  Anticoagulant Yes Apixaban  - Afib   Antibiotic No   Opioid No   Antiplatelet No   Hypoglycemics/insulin  Yes Empagliflozin , insulin  - DM  Vasoactive Medication Yes Amlodipine , benazepril , metoprolol  - HTN, Afib Clonidine  prn elevated BP  Chemotherapy No   Other Yes Ezetimibe , Rosuvastatin  - HLD Famotidine  - Reflux  Methimazole  - thyroid       Type of Medication Issue Identified Description of Issue Recommendation(s)  Drug Interaction(s) (clinically significant)     Duplicate Therapy     Allergy     No Medication Administration End Date     Incorrect Dose     Additional Drug Therapy Needed     Significant med changes from prior encounter (inform family/care partners about these prior to discharge). Glimepiride  PTA    Other       Clinically significant medication issues were identified that warrant physician communication and completion of prescribed/recommended actions by midnight of the next day:  No  Name of provider notified for urgent issues identified:   Provider Method of Notification:   Pharmacist comments: None  Time spent performing this drug regimen review (minutes):  20 minutes  Thank you. Olam Monte, PharmD

## 2024-01-25 NOTE — Evaluation (Signed)
 Speech Language Pathology Assessment and Plan  Patient Details  Name: Megan Fischer MRN: [id number] Date of Birth: 1950-06-02  SLP Diagnosis: Cognitive Impairments  Rehab Potential: Good ELOS: 2 weeks    Today's Date: 01/25/2024 SLP Individual Time: 9169-9070 SLP Individual Time Calculation (min): 59 min   Hospital Problem: Principal Problem:   Acute ischemic right PCA stroke [facility])  Past Medical History:  Past Medical History:  Diagnosis Date   Atrial fibrillation (HCC)    Chronic disease anemia 03/29/2011   Diabetes mellitus    Dyslipidemia    Hypertension    Obesities, morbid (HCC)    Osteoarthritis    Past Surgical History:  Past Surgical History:  Procedure Laterality Date   CHOLECYSTECTOMY     REPLACEMENT TOTAL KNEE BILATERAL     SHOULDER SURGERY     RIGHT SHOULDER   TUBAL LIGATION      Assessment / Plan / Recommendation Clinical Impression  Pt is a 73 y/o female with PMH of afib (on eliquis ), HTN, DM, OA with bilat TKA, R shoulder surgery who presented to [facility] on 01/21/24 after a fall at home with associated L sided weakness and R gaze preference. In ED pt was hypertensive but otherwise hemodynamically stable. 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 She was not a candidate for TNK and felt to be a poor candidate for thrombectomy given the established infarct and distal vessel location. Neurology consulted and stroke workup revealed right acute PCA with chronic small vessel disease on MRI, 60-65% EF on echo with mild LVH, A1C 6.5, and LDL 123. Recommendations were for Plavix  with transition to home eliquis  and addition of aspirin . Therapy ongoing and pt was recommended for CIR.   Cognitive-Linguistic Evaluation: Patient presents with cognitive deficits in the areas of memory (esp. [employer] fast streamer and delayed recall), sustained attention, L inattention with R gaze preference, reduced awareness of L inattention,  anticipatory awareness, and problem solving. Patient completed SLUMS evaluation, scoring 19/30 across all tasks and demonstrating deficits in the aforementioned domains. Oral mech exam was unremarkable, and patient demonstrates adequate motor speech and receptive/expressive language. She states awareness of changes to vision, though suspect patient experiences both L sided vision and attention changes subsequent to CVA.   Bedside Swallow Evaluation: Patient presents with clinical swallowing function that is grossly [facility] with no difficulty demonstrated across trials of thin liquids, regular solids, nor pill administration (whole with thin liquids, all pills taken at once). Patient endorses no changes to swallowing function since stroke. She would benefit from intermittent supervision during meals to assist cognitively given L inattention/visual deficits, but from swallowing standpoint, no overt risk associated with intake of regular/thin liquid diet. Continue to administer medications whole with thin liquids. No further SLP services indicated for swallowing function.   Patient would benefit from ongoing SLP services to target aforementioned cognitive deficits. Patient was left in lowered bed with call bell in reach and bed alarm set. SLP will continue to target goals per plan of care.     Skilled Therapeutic Interventions          SLP conducted skilled evaluation session to assess swallowing and cognitive-linguistic function. Utilized SLUMS, bedside swallow evaluation, functional task assessment, and patient and/or family interview. Full results above.   SLP Assessment  Patient will need skilled Speech Lanaguage Pathology Services during CIR admission    Recommendations  SLP Diet Recommendations: Age appropriate regular solids;Thin Liquid Administration via: Straw;Cup Medication  Administration: Whole meds with liquid Supervision: Patient able to self feed;Intermittent supervision to cue for compensatory  strategies (intermittent assistance for L visual/attention changes) Postural Changes and/or Swallow Maneuvers: Seated upright 90 degrees Oral Care Recommendations: Oral care BID Recommendations for Other Services: Neuropsych consult Patient destination: Home Follow up Recommendations: Outpatient SLP Equipment Recommended: None recommended by SLP    SLP Frequency 3 to 5 out of 7 days   SLP Duration  SLP Intensity  SLP Treatment/Interventions 2 weeks  Minumum of 1-2 x/day, 30 to 90 minutes  Cognitive remediation/compensation;Internal/external aids;Cueing hierarchy;Therapeutic Activities;Functional tasks;Patient/family education    Pain Pain Assessment Pain Scale: 0-10 Pain Score: 0-No pain  Prior Functioning Cognitive/Linguistic Baseline: Within functional limits Type of Home: Apartment  Lives With: Alone Available Help at Discharge: Family;Available 24 hours/day Education: 12th grade Vocation: Retired  SLP Evaluation Cognition Overall Cognitive Status: Within Functional Limits for tasks assessed Arousal/Alertness: Awake/alert Orientation Level: Oriented X4 Year: 2025 Month: September Day of Week: Correct Attention: Sustained Sustained Attention: Appears intact Memory: Impaired Memory Impairment: Retrieval deficit Problem Solving: Impaired Problem Solving Impairment: Verbal complex;Functional complex Executive Function: Sequencing;Organizing Sequencing: Impaired Sequencing Impairment: Verbal complex;Functional complex Organizing: Impaired Organizing Impairment: Verbal complex;Functional complex Safety/Judgment: Appears intact  Comprehension Auditory Comprehension Overall Auditory Comprehension: Appears within functional limits for tasks assessed Expression Expression Primary Mode of Expression: Verbal Verbal Expression Overall Verbal Expression: Appears within functional limits for tasks assessed Oral Motor Oral Motor/Sensory Function Overall Oral  Motor/Sensory Function: Within functional limits Motor Speech Overall Motor Speech: Appears within functional limits for tasks assessed Respiration: Within functional limits Phonation: Normal Resonance: Within functional limits Articulation: Within functional limitis Intelligibility: Intelligible Motor Planning: Within functional limits Motor Speech Errors: Not applicable  Care Tool Care Tool Cognition Ability to hear (with hearing aid or hearing appliances if normally used Ability to hear (with hearing aid or hearing appliances if normally used): 0. Adequate - no difficulty in normal conservation, social interaction, listening to TV   Expression of Ideas and Wants Expression of Ideas and Wants: 4. Without difficulty (complex and basic) - expresses complex messages without difficulty and with speech that is clear and easy to understand   Understanding Verbal and Non-Verbal Content Understanding Verbal and Non-Verbal Content: 4. Understands (complex and basic) - clear comprehension without cues or repetitions  Memory/Recall Ability Memory/Recall Ability : Current season;That he or she is in a hospital/hospital unit   Intelligibility: Intelligible  Bedside Swallowing Assessment General Date of Onset: 01/22/24 Previous Swallow Assessment: n/a Diet Prior to this Study: Regular;Thin liquids (Level 0) Temperature Spikes Noted: No Respiratory Status: Room air History of Recent Intubation: No Behavior/Cognition: Alert;Cooperative;Pleasant mood Oral Cavity - Dentition: Adequate natural dentition Self-Feeding Abilities: Able to feed self Patient Positioning: Upright in bed Baseline Vocal Quality: Normal Volitional Cough: Strong Volitional Swallow: Able to elicit  Oral Care Assessment Oral Assessment  (WDL): Within Defined Limits Lips: Symmetrical Teeth: Missing (Comment) Tongue: Pink;Moist Mucous Membrane(s): Moist;Pink Saliva: Moist, saliva free flowing Level of Consciousness:  Alert Is patient on any of following O2 devices?: None of the above Nutritional status: No high risk factors Oral Assessment Risk : Low Risk Ice Chips Ice chips: Not tested Thin Liquid Thin Liquid: Within functional limits Nectar Thick Nectar Thick Liquid: Not tested Honey Thick Honey Thick Liquid: Not tested Puree Puree: Not tested Solid Solid: Within functional limits Presentation: Self Fed BSE Assessment Risk for Aspiration Impact on safety and function: No limitations  Short Term Goals: Week 1: SLP Short Term Goal 1 (Week  1): Patient will attend to the L during functional therapy tasks to identify L presenting task components in 4/5 opportunities given min assist. SLP Short Term Goal 2 (Week 1): Patient will recall and manipulate functional information during problem solving tasks in 80% of trials given min assist. SLP Short Term Goal 3 (Week 1): Patient will solve mildly complex functional problems with 80% accuracy given overall min assist.  Refer to Care Plan for Long Term Goals  Recommendations for other services: Neuropsych  Discharge Criteria: Patient will be discharged from SLP if patient refuses treatment 3 consecutive times without medical reason, if treatment goals not met, if there is a change in medical status, if patient makes no progress towards goals or if patient is discharged from hospital.  The above assessment, treatment plan, treatment alternatives and goals were discussed and mutually agreed upon: by patient  Yoali Conry, M.A., CCC-SLP  Mialee Weyman A Saraiya Kozma 01/25/2024, 9:33 AM

## 2024-01-25 NOTE — Progress Notes (Signed)
   01/25/24 2100  What Happened  Was fall witnessed? No  Was patient injured? No  Patient found on floor  Found by Staff-comment Edwardo, RN)  Stated prior activity other (comment) (rolling over in bed)  Provider Notification  Provider Name/Title Jereld Rubens  Date Provider Notified 01/25/24  Time Provider Notified 2054  Method of Notification Call  Notification Reason Fall  Provider response See new orders  Date of Provider Response 01/25/24  Time of Provider Response 2054  Follow Up  Family notified Yes - comment Staphanie Harbison (son))  Time family notified 2100  Additional tests No  Progress note created (see row info) Yes  Adult Fall Risk Assessment  Risk Factor Category (scoring not indicated) High fall risk per protocol (document High fall risk)  Patient Fall Risk Level High fall risk  Adult Fall Risk Interventions  Required Bundle Interventions *See Row Information* High fall risk - low, moderate, and high requirements implemented  Additional Interventions Use of appropriate toileting equipment (bedpan, BSC, etc.)  Fall intervention(s) refused/Patient educated regarding refusal Bed alarm;Nonskid socks  Screening for Fall Injury Risk (To be completed on HIGH fall risk patients) - Assessing Need for Floor Mats  Risk For Fall Injury- Criteria for Floor Mats Bleeding risk-anticoagulation (not prophylaxis)  Will Implement Floor Mats Yes

## 2024-01-25 NOTE — Progress Notes (Signed)
 Megan Albright, MD  Physician Physical Medicine and Rehabilitation   Consult Note    Addendum   Date of Service: 01/23/2024 10:27 AM  Related encounter: ED to Hosp-Admission (Discharged) from 01/21/2024 in [place] [facility]   Expand All Collapse All           Physical Medicine and Rehabilitation Consult Reason for Consult:Rehab Referring Physician: Dr. Kem     HPI: Megan Fischer is a 73 y.o. female with past medical history of A-fib, diabetes mellitus, dyslipidemia, hypertension, obesity, osteoarthritis, CKD 3B who was admitted after he woke up in the morning with left-sided weakness and when she got up had a fall.  Patient was also noted to have left facial droop and complete left homonymous hemianopsia.  CT head showed moderately large acute to subacute right PCA infarct.  MRI of the brain indicated moderately large acute right PCA infarct and mild chronic small vessel ischemic disease.  CT of the C-spine showed multilevel disc and facet degeneration.  Allergy feels like etiology is likely cardioembolic versus large vessel disease.  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  Patient now on clopidogrel  75 mg with plan to restart home Eliquis  and enteric-coated aspirin  on Thursday.   She is on a  carb modified diet. Patient seen by PT and OT and found to be a candidate for CIR.  Patient required mod assistance for upper body dressing, total assistance for lower body dressing, max assistance for toilet transfers, mod assist +2 for sit to stand, max assist for stand pivot transfers.    Per chart review she lives in a 1 level home with level entry.  Family can assist 24/7 after discharge.       Home: Home Living Family/patient expects to be discharged to:: Private residence (per OT note) Living Arrangements: Alone Available Help at Discharge: Family,  Available 24 hours/day Type of Home: Apartment Home Access: Level entry Home Layout: One level Bathroom Shower/Tub: Engineer, manufacturing systems: Standard Home Equipment: Grab bars - tub/shower, Medical laboratory scientific officer - single point, Toilet riser Additional Comments: senior living apt  Functional History: Prior Function Prior Level of Function : Independent/Modified Independent Mobility Comments: cane PRN ADLs Comments: does not drive, ind ADLs, IADls (fiances and meds) Functional Status:  Mobility: Bed Mobility Overal bed mobility: Needs Assistance Bed Mobility: Supine to Sit Supine to sit: Contact guard General bed mobility comments: Pt exiting R side of bed with increased time, use of rail, no physical assist required Transfers Overall transfer level: Needs assistance Equipment used: Rolling walker (2 wheels), 2 person hand held assist Transfers: Sit to/from Stand, Bed to chair/wheelchair/BSC Sit to Stand: Mod assist, +2 physical assistance Bed to/from chair/wheelchair/BSC transfer type:: Stand pivot Stand pivot transfers: Max assist, +2 physical assistance General transfer comment: ModA + 2 to stand up to RW, assist to place L hand up on handle and cues for pushing through R hand to power up to stand. Pt with increased left lateral lean and returned to sitting. Performed face to face transfer bed to chair towards R with maxA + 2, pt with difficulty clearing L foot and sequencing movement Ambulation/Gait General Gait Details: unable   ADL: ADL Overall ADL's : Needs assistance/impaired Grooming: Moderate assistance, Sitting Upper Body Dressing : Moderate assistance, Sitting Lower Body Dressing: Total assistance, +2 for physical assistance Toilet Transfer: Maximal assistance, +2 for safety/equipment, +2  for physical assistance Toilet Transfer Details (indicate cue type and reason): bil hand held to recliner towards R side Functional mobility during ADLs: Moderate assistance, Maximal  assistance, +2 for physical assistance, Cueing for safety, Cueing for sequencing General ADL Comments: pt limited by L weakness, impaired vision and balance.  decreased awareness of deficits, poor initation and problem solvng   Cognition: Cognition Orientation Level: Oriented X4 Cognition Arousal: Alert Behavior During Therapy: WFL for tasks assessed/performed     Review of Systems  Constitutional:  Negative for fever.  HENT:  Negative for hearing loss.   Eyes:  Negative for double vision.       Vision changes on the right  Respiratory:  Negative for cough and shortness of breath.   Cardiovascular:  Negative for chest pain.  Gastrointestinal:  Negative for abdominal pain, constipation, diarrhea, nausea and vomiting.  Genitourinary: Negative.   Musculoskeletal:  Negative for back pain, joint pain and neck pain.  Skin:  Negative for rash.  Neurological:  Positive for headaches. Negative for dizziness and sensory change.  Psychiatric/Behavioral:  The patient does not have insomnia.        Past Medical History:  Diagnosis Date   Atrial fibrillation (HCC)     Chronic disease anemia 03/29/2011   Diabetes mellitus     Dyslipidemia     Hypertension     Obesities, morbid (HCC)     Osteoarthritis               Past Surgical History:  Procedure Laterality Date   CHOLECYSTECTOMY       REPLACEMENT TOTAL KNEE BILATERAL       SHOULDER SURGERY        RIGHT SHOULDER   TUBAL LIGATION                 Family History  Problem Relation Age of Onset   Heart attack Mother     Stroke Mother     Diabetes Mother     Hypertension Brother     Breast cancer Neg Hx          Social History:  reports that she has never smoked. She has never used smokeless tobacco. She reports that she does not drink alcohol and does not use drugs. Allergies:  Allergies       Allergies  Allergen Reactions   Aspirin  Nausea Only      Patient stated to neuro that it caused an upset stomach   Codeine  Other (See Comments)      Unknown    Fentanyl  Other (See Comments)      hypoxia   Oxycodone Hcl Other (See Comments)      Unknown    Penicillins Other (See Comments)      Unknown    Chlorhexidine Gluconate Rash            Medications Prior to Admission  Medication Sig Dispense Refill   apixaban  (ELIQUIS ) 5 MG TABS tablet Take 1 tablet (5 mg total) by mouth 2 (two) times daily. 180 tablet 1   benazepril  (LOTENSIN ) 20 MG tablet Take 1 tablet (20 mg total) by mouth daily. 30 tablet 11   CALCIUM  PO Take 1 tablet by mouth 2 (two) times daily.       diltiazem  (CARDIZEM  CD) 360 MG 24 hr capsule TAKE 1 CAPSULE BY MOUTH DAILY 100 capsule 2   DULoxetine  (CYMBALTA ) 60 MG capsule Take 60 mg by mouth daily.       empagliflozin  (JARDIANCE ) 25  MG TABS tablet Take 1 tablet (25 mg total) by mouth daily before breakfast. 100 tablet 3   ergocalciferol (VITAMIN D2) 50000 UNITS capsule Take 50,000 Units by mouth once a week.       ezetimibe  (ZETIA ) 10 MG tablet Take 1 tablet (10 mg total) by mouth daily. 90 tablet 2   ferrous sulfate  325 (65 FE) MG EC tablet Take 1 tablet (325 mg total) by mouth 2 (two) times daily. (Patient taking differently: Take 325 mg by mouth daily.) 60 tablet 3   furosemide  (LASIX ) 40 MG tablet Take 1 tablet (40 mg total) by mouth daily. 30 tablet 2   glimepiride  (AMARYL ) 1 MG tablet Take 1 tablet (1 mg total) by mouth daily with breakfast. 90 tablet 4   methimazole  (TAPAZOLE ) 5 MG tablet Take 1 tablet (5 mg total) by mouth daily. 90 tablet 3   methocarbamol  (ROBAXIN ) 500 MG tablet TAKE 2 TABLETS BY MOUTH EVERY 6  HOURS AS NEEDED FOR MUSCLE  SPASMS 240 tablet 2   metoprolol  succinate (TOPROL -XL) 50 MG 24 hr tablet TAKE 1 TABLET BY MOUTH DAILY  WITH OR IMMEDIATELY FOLLOWING A  MEAL 90 tablet 3   rosuvastatin  (CRESTOR ) 10 MG tablet Take 1 tablet (10 mg total) by mouth daily. 90 tablet 3   Semaglutide , 2 MG/DOSE, (OZEMPIC , 2 MG/DOSE,) 8 MG/3ML SOPN INJECT SUBCUTANEOUSLY 2 MG EVERY WEEK 9  mL 3   mupirocin  ointment (BACTROBAN ) 2 % Apply 1 Application topically 2 (two) times daily. Apply to finger wound twice daily and cover with dry bandage (Patient not taking: Reported on 01/22/2024) 22 g 0   ONETOUCH VERIO test strip USE AS DIRECTED TO CHECK  BLOOD SUGAR TWICE DAILY 200 strip 3            Blood pressure (!) 171/96, pulse 82, temperature 98 F (36.7 C), temperature source Oral, resp. rate 20, height 5' 3 (1.6 m), weight 87 kg, SpO2 98%. Physical Exam   General: No apparent distress HEENT: Head is normocephalic, atraumatic, sclera anicteric, oral mucosa dry Neck: Supple without JVD or lymphadenopathy Heart: Reg rate and rhythm. No murmurs rubs or gallops Chest: CTA bilaterally without wheezes, rales, or rhonchi; no distress Abdomen: Soft, non-tender, non-distended, bowel sounds positive. Extremities: No clubbing, cyanosis, or edema. Pulses are 2+ Psych: flat Skin: Clean and intact without signs of breakdown Neuro:     Mental Status: AAOx4, right gaze preference but able to cross midline Speech/Languate: Naming and repetition intact, fluent, follows simple commands CRANIAL NERVES: II: PERRL.  Left homonymous hemianopsia III, IV, VI: EOM intact, no gaze preference or deviation V: normal sensation bilaterally VII: Left facial droop VIII: normal hearing to speech IX, X: normal palatal elevation XI: 5/5 head turn  XII: Tongue midline     MOTOR: RUE: 4+/5 Deltoid, 4+/5 Biceps, 4+/5 Triceps,4+/5 Grip LUE: 2/5 Deltoid, 4-/5 Biceps, 4-/5 Triceps, 4-/5 Grip RLE: HF 4/5, KE 4+/5, ADF 4+/5, APF 4+/5 LLE: HF 3/5, KE 1-2/5, ADF 4-/5, APF 4-/5     REFLEXES: No ankle clonus   SENSORY: Normal to touch all 4 extremities   Coordination: finger to nose altered on left side appears in proportion to weakness   MSK: healed TKA incisions, no joint swelling noted     Lab Results Last 24 Hours       Results for orders placed or performed during the hospital encounter of  01/21/24 (from the past 24 hours)  Glucose, capillary     Status: Abnormal    Collection  Time: 01/22/24 11:44 AM  Result Value Ref Range    Glucose-Capillary 177 (H) 70 - 99 mg/dL  Glucose, capillary     Status: Abnormal    Collection Time: 01/22/24  4:43 PM  Result Value Ref Range    Glucose-Capillary 107 (H) 70 - 99 mg/dL  Glucose, capillary     Status: Abnormal    Collection Time: 01/22/24  9:34 PM  Result Value Ref Range    Glucose-Capillary 148 (H) 70 - 99 mg/dL    Comment 1 Notify RN      Comment 2 Document in Chart    Glucose, capillary     Status: Abnormal    Collection Time: 01/23/24  6:24 AM  Result Value Ref Range    Glucose-Capillary 103 (H) 70 - 99 mg/dL    Comment 1 Notify RN      Comment 2 Document in Chart    Basic metabolic panel with GFR     Status: Abnormal    Collection Time: 01/23/24  7:39 AM  Result Value Ref Range    Sodium 142 135 - 145 mmol/L    Potassium 4.1 3.5 - 5.1 mmol/L    Chloride 103 98 - 111 mmol/L    CO2 21 (L) 22 - 32 mmol/L    Glucose, Bld 121 (H) 70 - 99 mg/dL    BUN 29 (H) 8 - 23 mg/dL    Creatinine, Ser 8.54 (H) 0.44 - 1.00 mg/dL    Calcium  9.4 8.9 - 10.3 mg/dL    GFR, Estimated 38 (L) >60 mL/min    Anion gap 18 (H) 5 - 15       Imaging Results (Last 48 hours)  MR BRAIN WO CONTRAST Result Date: 01/21/2024 CLINICAL DATA:  Stroke, follow up. EXAM: MRI HEAD WITHOUT CONTRAST TECHNIQUE: Multiplanar, multiecho pulse sequences of the brain and surrounding structures were obtained without intravenous contrast. COMPARISON:  Head CT, CTA, and CTP 01/21/2024 FINDINGS: The study is intermittently moderately motion degraded. Brain: There is a moderately large acute right PCA infarct involving much of the occipital lobe and portions of the medial right temporal lobe and right thalamus. There is associated cytotoxic edema without midline shift or other significant mass effect. Chronic microhemorrhages are noted in the right frontal and likely right  parietal lobes. T2 hyperintensities elsewhere in the cerebral white matter bilaterally are nonspecific but compatible with mild chronic small vessel ischemic disease. Moderate chronic small vessel changes are present in the pons. No mass or extra-axial fluid collection is evident. A partially empty sella is noted. Mild cerebral atrophy is within normal limits for age. Vascular: More fully evaluated on today's earlier CTA. Skull and upper cervical spine: Unremarkable bone marrow signal. Sinuses/Orbits: Bilateral cataract extraction. Trace right mastoid fluid. Minimal left ethmoid sinus mucosal thickening. Other: None. IMPRESSION: 1. Moderately large acute right PCA infarct. 2. Mild chronic small vessel ischemic disease. Electronically Signed   By: Dasie Hamburg M.D.   On: 01/21/2024 16:58    ECHOCARDIOGRAM COMPLETE Result Date: 01/21/2024    ECHOCARDIOGRAM REPORT   Patient Name:   Megan Fischer Date of Exam: 01/21/2024 Medical Rec #:  [id number]     Height:       63.0 in Accession #:    [id number]    Weight:       191.8 lb Date of Birth:  [birth year]    BSA:          1.900 m Patient Age:    89 years  BP:           183/97 mmHg Patient Gender: F             HR:           74 bpm. Exam Location:  Inpatient Procedure: 2D Echo, Cardiac Doppler, Color Doppler and Saline Contrast Bubble            Study (Both Spectral and Color Flow Doppler were utilized during            procedure). Indications:    Stroke  History:        Patient has prior history of Echocardiogram examinations, most                 recent 08/25/2021. Abnormal ECG, Stroke, Arrythmias:Atrial                 Fibrillation, Signs/Symptoms:Chest Pain; Risk                 Factors:Hypertension, Diabetes and Dyslipidemia.  Sonographer:    Ellouise Mose RDCS Referring Phys: [id number] JEFFREY C HATCHER IMPRESSIONS  1. Left ventricular ejection fraction, by estimation, is 60 to 65%. The left ventricle has normal function. The left ventricle has no regional wall motion  abnormalities. There is mild left ventricular hypertrophy. Left ventricular diastolic parameters are indeterminate.  2. Right ventricular systolic function is normal. The right ventricular size is normal. There is moderately elevated pulmonary artery systolic pressure. The estimated right ventricular systolic pressure is 56.2 mmHg.  3. The mitral valve is normal in structure. Trivial mitral valve regurgitation. No evidence of mitral stenosis.  4. Tricuspid valve regurgitation is moderate.  5. The aortic valve is tricuspid. Aortic valve regurgitation is not visualized. No aortic stenosis is present.  6. Agitated saline contrast bubble study was negative, with no evidence of any interatrial shunt. Comparison(s): No significant change from prior study. FINDINGS  Left Ventricle: Left ventricular ejection fraction, by estimation, is 60 to 65%. The left ventricle has normal function. The left ventricle has no regional wall motion abnormalities. The left ventricular internal cavity size was normal in size. There is  mild left ventricular hypertrophy. Left ventricular diastolic parameters are indeterminate. Right Ventricle: The right ventricular size is normal. No increase in right ventricular wall thickness. Right ventricular systolic function is normal. There is moderately elevated pulmonary artery systolic pressure. The tricuspid regurgitant velocity is 3.21 m/s, and with an assumed right atrial pressure of 15 mmHg, the estimated right ventricular systolic pressure is 56.2 mmHg. Left Atrium: Left atrial size was normal in size. Right Atrium: Right atrial size was normal in size. Pericardium: There is no evidence of pericardial effusion. Mitral Valve: The mitral valve is normal in structure. Mild mitral annular calcification. Trivial mitral valve regurgitation. No evidence of mitral valve stenosis. Tricuspid Valve: The tricuspid valve is normal in structure. Tricuspid valve regurgitation is moderate . No evidence of  tricuspid stenosis. Aortic Valve: The aortic valve is tricuspid. Aortic valve regurgitation is not visualized. No aortic stenosis is present. Pulmonic Valve: The pulmonic valve was normal in structure. Pulmonic valve regurgitation is mild. No evidence of pulmonic stenosis. Aorta: The aortic root and ascending aorta are structurally normal, with no evidence of dilitation. IAS/Shunts: No atrial level shunt detected by color flow Doppler. Agitated saline contrast was given intravenously to evaluate for intracardiac shunting. Agitated saline contrast bubble study was negative, with no evidence of any interatrial shunt.  LEFT VENTRICLE PLAX 2D LVIDd:  4.10 cm LVIDs:         2.80 cm LV PW:         1.20 cm LV IVS:        1.20 cm LVOT diam:     2.20 cm LV SV:         70 LV SV Index:   37 LVOT Area:     3.80 cm  LV Volumes (MOD) LV vol d, MOD A2C: 74.0 ml LV vol d, MOD A4C: 79.2 ml LV vol s, MOD A2C: 29.8 ml LV vol s, MOD A4C: 34.7 ml LV SV MOD A2C:     44.2 ml LV SV MOD A4C:     79.2 ml LV SV MOD BP:      44.1 ml RIGHT VENTRICLE            IVC RV S prime:     9.36 cm/s  IVC diam: 1.60 cm TAPSE (M-mode): 0.9 cm LEFT ATRIUM             Index        RIGHT ATRIUM           Index LA diam:        4.00 cm 2.11 cm/m   RA Area:     15.70 cm LA Vol (A2C):   50.1 ml 26.37 ml/m  RA Volume:   40.90 ml  21.53 ml/m LA Vol (A4C):   51.3 ml 27.01 ml/m LA Biplane Vol: 51.1 ml 26.90 ml/m  AORTIC VALVE LVOT Vmax:   92.00 cm/s LVOT Vmean:  59.000 cm/s LVOT VTI:    0.184 m  AORTA Ao Root diam: 2.70 cm Ao Asc diam:  3.20 cm MITRAL VALVE               TRICUSPID VALVE MV Area (PHT): 3.31 cm    TR Peak grad:   41.2 mmHg MV Decel Time: 229 msec    TR Vmax:        321.00 cm/s MV E velocity: 84.90 cm/s                            SHUNTS                            Systemic VTI:  0.18 m                            Systemic Diam: 2.20 cm Aditya Sabharwal Electronically signed by Ria Commander Signature Date/Time: 01/21/2024/3:56:26 PM     Final        Assessment/Plan: Diagnosis: Right PCA CVA including right thalamus Does the need for close, 24 hr/day medical supervision in concert with the patient's rehab needs make it unreasonable for this patient to be served in a less intensive setting? Yes Co-Morbidities requiring supervision/potential complications:  - A-fib, hypertension, hyperlipidemia, diabetes mellitus type 2, dysphagia, obesity, chronic low back pain, hypothyroidism, CKD 3B, hyperlipidemia   Due to bladder management, bowel management, safety, skin/wound care, disease management, medication administration, pain management, and patient education, does the patient require 24 hr/day rehab nursing? Yes Does the patient require coordinated care of a physician, rehab nurse, therapy disciplines of PT/OT to address physical and functional deficits in the context of the above medical diagnosis(es)? Yes Addressing deficits in the following areas: balance, endurance, locomotion, strength, transferring, bowel/bladder control, bathing, dressing, feeding, grooming,  toileting, swallowing, and psychosocial support Can the patient actively participate in an intensive therapy program of at least 3 hrs of therapy per day at least 5 days per week? Yes The potential for patient to make measurable gains while on inpatient rehab is excellent Anticipated functional outcomes upon discharge from inpatient rehab are modified independent and supervision  with PT, modified independent and supervision with OT, modified independent with SLP. Estimated rehab length of stay to reach the above functional goals is: 10-14 Anticipated discharge destination: Home Overall Rehab/Functional Prognosis: excellent   POST ACUTE RECOMMENDATIONS: This patient's condition is appropriate for continued rehabilitative care in the following setting: CIR Patient has agreed to participate in recommended program. Yes Note that insurance prior authorization may be required  for reimbursement for recommended care.   Comment: I think patient would be a good candidate for CIR, rehab coordinator follow-up.       I have personally performed a face to face diagnostic evaluation of this patient. Additionally, I have examined the patient's medical record including any pertinent labs and radiographic images.     Thanks,   Murray Collier, MD 01/23/2024       Revision History  Routing History

## 2024-01-25 NOTE — H&P (Addendum)
 Physical Medicine and Rehabilitation Admission H&P        Chief Complaint  Patient presents with   Functional deficits due to stroke      HPI: CHARAE Fischer is a 73 year old female with history of CAF, T2DM, giant cell bone tumor who was admitted on 01/21/24 with left sided weakness, left facial weakness and L-HH. CT head showed moderately large acute to subacute PCA infarct. MRI brain showed large acute R-PCA infarct involving much of occipital lobe and portions of medial right temporal and right thalamus. CTA head/neck showed occlusion of R-ICA with intra-cranial reconstitution and acute R-PCA infarct with 33 ml penumbra.   2 D echo showed EF 60-65% with no wall etiology. Patient has ASA allergy and Neurology recommended Plavix  with plans to resume Eliquis  on 09/18. Low dose ECASA also added additionally and Crestor  increased from 10-20 mg due to hyperlipidemia.  Amlodipine  was added for blood pressure control.  Reports last bowel movement was about 2 days ago.  PT/OT consulted and patient impaired by visual deficits as well as left sided weakness with sensory deficits. She requires +2 mod assist for transfers, max assist with sitting balance and mod assist +2 with ADLs. She was independent til 1-2 months PTA. She has been having falls and son/daughter in law have been assist PTA. She has been using a walker for a couple of months but limited distances and perform ADLs. CIR recommended due to functional decline.      Review of Systems  Constitutional:  Negative for chills and fever.  HENT:  Negative for hearing loss.   Eyes:  Negative for blurred vision.       Can't see out of left eye  Respiratory:  Negative for cough and shortness of breath.   Cardiovascular:  Negative for chest pain.  Gastrointestinal:  Positive for constipation. Negative for heartburn and nausea.  Genitourinary:  Negative for dysuria.  Musculoskeletal:  Positive for back pain, falls (for months) and joint pain  (both knees--need replacement.).       Legs have been weak but getting worse.   Neurological:  Positive for sensory change (feet get numb sometimes. Sciatica on right--feels like pins and needles.) and weakness. Negative for headaches.  Psychiatric/Behavioral:  The patient does not have insomnia.          Past Medical History:  Diagnosis Date   Atrial fibrillation (HCC)     Chronic disease anemia 03/29/2011   Diabetes mellitus     Dyslipidemia     Hypertension     Obesities, morbid (HCC)     Osteoarthritis                 Past Surgical History:  Procedure Laterality Date   CHOLECYSTECTOMY       REPLACEMENT TOTAL KNEE BILATERAL       SHOULDER SURGERY        RIGHT SHOULDER   TUBAL LIGATION                   Family History  Problem Relation Age of Onset   Heart attack Mother     Stroke Mother     Diabetes Mother     Hypertension Brother     Breast cancer Neg Hx            Social History:  Was living alone prior to couple of months. Used to work for [employer]. Disabled since '76 due to tumor/weakness. She  reports that she has never smoked. She has never used smokeless tobacco. She reports that she does not drink alcohol and does not use drugs.     Allergies       Allergies  Allergen Reactions   Aspirin  Nausea Only      Patient stated to neuro that it caused an upset stomach   Codeine Other (See Comments)      Unknown    Fentanyl  Other (See Comments)      hypoxia   Oxycodone Hcl Other (See Comments)      Unknown    Penicillins Other (See Comments)      Unknown    Chlorhexidine Gluconate Rash              Medications Prior to Admission  Medication Sig Dispense Refill   apixaban  (ELIQUIS ) 5 MG TABS tablet Take 1 tablet (5 mg total) by mouth 2 (two) times daily. 180 tablet 1   benazepril  (LOTENSIN ) 20 MG tablet Take 1 tablet (20 mg total) by mouth daily. 30 tablet 11   CALCIUM  PO Take 1 tablet by mouth 2 (two) times daily.       diltiazem   (CARDIZEM  CD) 360 MG 24 hr capsule TAKE 1 CAPSULE BY MOUTH DAILY 100 capsule 2   DULoxetine  (CYMBALTA ) 60 MG capsule Take 60 mg by mouth daily.       empagliflozin  (JARDIANCE ) 25 MG TABS tablet Take 1 tablet (25 mg total) by mouth daily before breakfast. 100 tablet 3   ergocalciferol (VITAMIN D2) 50000 UNITS capsule Take 50,000 Units by mouth once a week.       ezetimibe  (ZETIA ) 10 MG tablet Take 1 tablet (10 mg total) by mouth daily. 90 tablet 2   ferrous sulfate  325 (65 FE) MG EC tablet Take 1 tablet (325 mg total) by mouth 2 (two) times daily. (Patient taking differently: Take 325 mg by mouth daily.) 60 tablet 3   furosemide  (LASIX ) 40 MG tablet Take 1 tablet (40 mg total) by mouth daily. 30 tablet 2   glimepiride  (AMARYL ) 1 MG tablet Take 1 tablet (1 mg total) by mouth daily with breakfast. 90 tablet 4   methimazole  (TAPAZOLE ) 5 MG tablet Take 1 tablet (5 mg total) by mouth daily. 90 tablet 3   methocarbamol  (ROBAXIN ) 500 MG tablet TAKE 2 TABLETS BY MOUTH EVERY 6  HOURS AS NEEDED FOR MUSCLE  SPASMS 240 tablet 2   metoprolol  succinate (TOPROL -XL) 50 MG 24 hr tablet TAKE 1 TABLET BY MOUTH DAILY  WITH OR IMMEDIATELY FOLLOWING A  MEAL 90 tablet 3   rosuvastatin  (CRESTOR ) 10 MG tablet Take 1 tablet (10 mg total) by mouth daily. 90 tablet 3   Semaglutide , 2 MG/DOSE, (OZEMPIC , 2 MG/DOSE,) 8 MG/3ML SOPN INJECT SUBCUTANEOUSLY 2 MG EVERY WEEK 9 mL 3   mupirocin  ointment (BACTROBAN ) 2 % Apply 1 Application topically 2 (two) times daily. Apply to finger wound twice daily and cover with dry bandage (Patient not taking: Reported on 01/22/2024) 22 g 0   ONETOUCH VERIO test strip USE AS DIRECTED TO CHECK  BLOOD SUGAR TWICE DAILY 200 strip 3          Home: Home Living Family/patient expects to be discharged to:: Private residence (per OT note) Living Arrangements: Alone Available Help at Discharge: Family, Available 24 hours/day Type of Home: Apartment Home Access: Level entry Home Layout: One  level Bathroom Shower/Tub: Engineer, manufacturing systems: Standard Home Equipment: Grab bars - tub/shower, Cane - single point, Toilet riser  Additional Comments: senior living apt   Functional History: Prior Function Prior Level of Function : Independent/Modified Independent Mobility Comments: cane PRN ADLs Comments: does not drive, ind ADLs, IADls (fiances and meds)   Functional Status:  Mobility: Bed Mobility Overal bed mobility: Needs Assistance Bed Mobility: Supine to Sit Supine to sit: Contact guard General bed mobility comments: Pt exiting R side of bed with improved efficiency in compared to yesterday, cues for bringing R hip forward to edge Transfers Overall transfer level: Needs assistance Equipment used: Rolling walker (2 wheels), 2 person hand held assist Transfers: Sit to/from Stand, Bed to chair/wheelchair/BSC Sit to Stand: Max assist, From elevated surface Bed to/from chair/wheelchair/BSC transfer type:: Squat pivot Stand pivot transfers: +2 physical assistance, Mod assist Squat pivot transfers: Max assist General transfer comment: Attempted to stand to RW with +1 assist and pt unable. Pt needs manual placement of feet shoulder width apart, facilitation to bring LUE up to RW and support, pushing off with RUE from chair arm with pt not being able to stand with only 1 person assist. Max assist of 1 to perform squat pivot transfer towards right with use of pad (+2 not available). Ambulation/Gait General Gait Details: unable   ADL: ADL Overall ADL's : Needs assistance/impaired Grooming: Moderate assistance, Sitting Upper Body Dressing : Moderate assistance, Sitting Lower Body Dressing: Total assistance, +2 for physical assistance Toilet Transfer: Maximal assistance, +2 for safety/equipment, +2 for physical assistance Toilet Transfer Details (indicate cue type and reason): bil hand held to recliner towards R side Functional mobility during ADLs: Moderate assistance,  Maximal assistance, +2 for physical assistance, Cueing for safety, Cueing for sequencing General ADL Comments: pt limited by L weakness, impaired vision and balance.  decreased awareness of deficits, poor initation and problem solvng   Cognition: Cognition Orientation Level: Oriented X4, Oriented to person, Oriented to place, Oriented to time, Oriented to situation Cognition Arousal: Alert Behavior During Therapy: [facility] for tasks assessed/performed     Blood pressure (!) 168/107, pulse 77, temperature 97.7 F (36.5 C), temperature source Oral, resp. rate 19, height 5' 3 (1.6 m), weight 87 kg, SpO2 98%.   General: No apparent distress, working with SLP HEENT: Head is normocephalic, atraumatic, sclera anicteric, oral mucosa moist Neck: Supple without JVD or lymphadenopathy Heart: Reg rate and rhythm.  Chest: CTA bilaterally without wheezes, rales, or rhonchi; no distress Abdomen: Soft, non-tender, non-distended, bowel sounds positive. Psych: flat, cooperative Skin: Clean and intact without signs of breakdown Neuro: Alert and oriented x 4, follows simple commands, able to name and repeat, left homonymous hemianopsia, right gaze preference able to cross midline.  Left facial weakness. Left upper and lower extremity weakness more proximal than distal   MSK: healed TKA incisions, no joint swelling noted     Lab Results Last 48 Hours        Results for orders placed or performed during the hospital encounter of 01/21/24 (from the past 48 hours)  Glucose, capillary     Status: Abnormal    Collection Time: 01/22/24  4:43 PM  Result Value Ref Range    Glucose-Capillary 107 (H) 70 - 99 mg/dL      Comment: Glucose reference range applies only to samples taken after fasting for at least 8 hours.  Glucose, capillary     Status: Abnormal    Collection Time: 01/22/24  9:34 PM  Result Value Ref Range    Glucose-Capillary 148 (H) 70 - 99 mg/dL      Comment: Glucose reference  range applies only  to samples taken after fasting for at least 8 hours.    Comment 1 Notify RN      Comment 2 Document in Chart    Glucose, capillary     Status: Abnormal    Collection Time: 01/23/24  6:24 AM  Result Value Ref Range    Glucose-Capillary 103 (H) 70 - 99 mg/dL      Comment: Glucose reference range applies only to samples taken after fasting for at least 8 hours.    Comment 1 Notify RN      Comment 2 Document in Chart    Basic metabolic panel with GFR     Status: Abnormal    Collection Time: 01/23/24  7:39 AM  Result Value Ref Range    Sodium 142 135 - 145 mmol/L    Potassium 4.1 3.5 - 5.1 mmol/L    Chloride 103 98 - 111 mmol/L    CO2 21 (L) 22 - 32 mmol/L    Glucose, Bld 121 (H) 70 - 99 mg/dL      Comment: Glucose reference range applies only to samples taken after fasting for at least 8 hours.    BUN 29 (H) 8 - 23 mg/dL    Creatinine, Ser 8.54 (H) 0.44 - 1.00 mg/dL    Calcium  9.4 8.9 - 10.3 mg/dL    GFR, Estimated 38 (L) >60 mL/min      Comment: (NOTE) Calculated using the CKD-EPI Creatinine Equation (2021)      Anion gap 18 (H) 5 - 15      Comment: Performed at [facility], [address] [place], [place] [address]  Glucose, capillary     Status: Abnormal    Collection Time: 01/23/24 12:24 PM  Result Value Ref Range    Glucose-Capillary 106 (H) 70 - 99 mg/dL      Comment: Glucose reference range applies only to samples taken after fasting for at least 8 hours.  Glucose, capillary     Status: Abnormal    Collection Time: 01/23/24  4:25 PM  Result Value Ref Range    Glucose-Capillary 123 (H) 70 - 99 mg/dL      Comment: Glucose reference range applies only to samples taken after fasting for at least 8 hours.  Glucose, capillary     Status: Abnormal    Collection Time: 01/23/24  9:57 PM  Result Value Ref Range    Glucose-Capillary 132 (H) 70 - 99 mg/dL      Comment: Glucose reference range applies only to samples taken after fasting for at least 8 hours.    Comment 1  Notify RN      Comment 2 Document in Chart    Glucose, capillary     Status: Abnormal    Collection Time: 01/24/24  6:24 AM  Result Value Ref Range    Glucose-Capillary 120 (H) 70 - 99 mg/dL      Comment: Glucose reference range applies only to samples taken after fasting for at least 8 hours.    Comment 1 Notify RN    Glucose, capillary     Status: Abnormal    Collection Time: 01/24/24 11:53 AM  Result Value Ref Range    Glucose-Capillary 196 (H) 70 - 99 mg/dL      Comment: Glucose reference range applies only to samples taken after fasting for at least 8 hours.      Imaging Results (Last 48 hours)  No results found.  Blood pressure (!) 168/107, pulse 77, temperature 97.7 F (36.5 C), temperature source Oral, resp. rate 19, height 5' 3 (1.6 m), weight 87 kg, SpO2 98%.   Medical Problem List and Plan: 1. Functional deficits secondary to R PCA CVA involving right thalamus, likely cardioembolic with A-fib despite Eliquis  versus large vessel disease             -patient may shower             -ELOS/Goals: 15 to 18 days             - Admit to CIR 2.  Antithrombotics: -DVT/anticoagulation:  Pharmaceutical: Eliquis              -antiplatelet therapy: ECASA 3. Pain Management: tylenol  prn. Voltaren  gel qid to knee.  4. Mood/Behavior/Sleep: LCSW to follow for evaluation and support.             --Melatonin prn.              -antipsychotic agents: N/A 5. Neuropsych/cognition: This patient is capable of making decisions on her own behalf. 6. Skin/Wound Care: Routine pressure relief measures.  7. Fluids/Electrolytes/Nutrition: Monitor I/O. Check CMET in am.  8. A fib: Monitor HR TID--on metoprolol  --Eliquis  resumed 09/18 9. HTN: Monitor BP TID--on amlodipine , Lotensin  and Metoprolol .  Avoid hypotension and with long-term goal normotensive             -Fair control continue to monitor, internal medicine suggests outpatient on thiazide with benazepril  combo pill to reduce pill  burden     01/25/2024    8:45 AM 01/25/2024    8:42 AM 01/25/2024    4:26 AM  Vitals with BMI  Weight   193 lbs 13 oz 183 lbs 10 oz  BMI   34.34 32.54  Systolic 141 141 850  Diastolic 85 85 75  Pulse 77 77 86      10. CKD: BUN/SCr 30/1.40 @ admission.  --Baseline likely 1.3-1.5 in the last few months.              --Recheck in am.              - 9/19 reorder CMP, initial lab draw hemolyzed 11. T2DM: Hgb A1c- 6.5 and well controlled. Was on Semaglutide  1 mg, Amaryl  1 mg and Jardiance  25 mg PTA             --Jardiance  resumed on 09/18 --Monitor BS ac/hs and use SSI for elevated BS -9/19 fair control continue to monitor trend   CBG (last 3)  Recent Labs (last 2 labs)        Recent Labs    01/24/24 2126 01/25/24 0611 01/25/24 1147  GLUCAP 136* 207* 135*        12. 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  13. Scoliosis/Spondylolisthesis: Has hx of chronic LBP w/BLE weakness --also endstage OA right knee--Diclofenac  gel qid 14. Hyperthyroid: Managed with Tapazole  5 mg daily.  15. Hyperlipidemia: LDL 123 w/goal < 70.  --On Crestor  20 mg (was increased) and Zetia .  16. Constipation  -Start miralax        Sharlet GORMAN Schmitz, PA-C 01/24/2024   I have personally performed a face to face diagnostic evaluation of this patient and formulated the key components of the plan.  Additionally, I have personally reviewed laboratory data, imaging studies, as well as relevant notes  and concur with the physician assistant's documentation above.   The patient's status has not changed from the original H&P.  Any changes in documentation from the acute care chart have been noted above.   Murray Collier, MD

## 2024-01-25 NOTE — Progress Notes (Signed)
 Inpatient Rehabilitation  Patient information reviewed and entered into eRehab system by [facility]. Karen Kays., CCC/SLP, PPS Coordinator.  Information including medical coding, functional ability and quality indicators will be reviewed and updated through discharge.

## 2024-01-25 NOTE — Progress Notes (Signed)
 Patient after therapy needing multiple prompts by writer and Physical Therapist with use of the stedy and repositioning herself. Talked about wanting to use the bathroom. Difficulty making decision and concentrating. Asked to have the wheelchair turned around to hold on to the handles of the wheelchair. Explained about using the stedy. Forgetful about using it earlier in the day. When using the stedy needing prompts about how to use it again. Attempting to stand while in the stedy. Making nonsensical and tangential statements. Increase episodes of laughing. Grossly euthymic mood. When in the stedy +2 with heavy right lean. While on the toilet attending to ADLS having diffiuclty having to clean patients' hands due to having feces on them. Alert and oriented x 4. When attempting to have patient stand and sit on the stedy left leg gave out and patient lean further to the right. Difficulty moving right leg and right foot going behind left leg. Difficulty repositioning leg and following commands at this time. Reporting decrease sensation to left hand. Denies any changes to right hand though difficulty moving fingers in right hand. Right eye moving inward towards her nose. Blood pressure obtained. Pulse is tachycardic. Charge Nurse and Provider made aware. Once in bed improvement demonstrated by patient.

## 2024-01-25 NOTE — Plan of Care (Signed)
  Problem: RH Grooming Goal: LTG Patient will perform grooming w/assist,cues/equip (OT) Description: LTG: Patient will perform grooming with assist, with/without cues using equipment (OT) Flowsheets (Taken 01/25/2024 1112) LTG: Pt will perform grooming with assistance level of: Independent   Problem: RH Bathing Goal: LTG Patient will bathe all body parts with assist levels (OT) Description: LTG: Patient will bathe all body parts with assist levels (OT) Flowsheets (Taken 01/25/2024 1112) LTG: Pt will perform bathing with assistance level/cueing: Supervision/Verbal cueing LTG: Position pt will perform bathing: Shower   Problem: RH Dressing Goal: LTG Patient will perform upper body dressing (OT) Description: LTG Patient will perform upper body dressing with assist, with/without cues (OT). Flowsheets (Taken 01/25/2024 1112) LTG: Pt will perform upper body dressing with assistance level of: Set up assist Goal: LTG Patient will perform lower body dressing w/assist (OT) Description: LTG: Patient will perform lower body dressing with assist, with/without cues in positioning using equipment (OT) Flowsheets (Taken 01/25/2024 1112) LTG: Pt will perform lower body dressing with assistance level of: Contact Guard/Touching assist   Problem: RH Toileting Goal: LTG Patient will perform toileting task (3/3 steps) with assistance level (OT) Description: LTG: Patient will perform toileting task (3/3 steps) with assistance level (OT)  Flowsheets (Taken 01/25/2024 1112) LTG: Pt will perform toileting task (3/3 steps) with assistance level: Contact Guard/Touching assist   Problem: RH Vision Goal: RH LTG Vision Consulting civil engineer) Flowsheets (Taken 01/25/2024 1112) LTG: Vision Goals: Patient to improve attention to L side with only minimal verbal cues   Problem: RH Functional Use of Upper Extremity Goal: LTG Patient will use RT/LT upper extremity as a (OT) Description: LTG: Patient will use right/left upper extremity  as a stabilizer/gross assist/diminished/nondominant/dominant level with assist, with/without cues during functional activity (OT) Flowsheets (Taken 01/25/2024 1112) LTG: Use of upper extremity in functional activities: LUE as nondominant level LTG: Pt will use upper extremity in functional activity with assistance level of: Supervision/Verbal cueing   Problem: RH Toilet Transfers Goal: LTG Patient will perform toilet transfers w/assist (OT) Description: LTG: Patient will perform toilet transfers with assist, with/without cues using equipment (OT) Flowsheets (Taken 01/25/2024 1112) LTG: Pt will perform toilet transfers with assistance level of: Contact Guard/Touching assist   Problem: RH Tub/Shower Transfers Goal: LTG Patient will perform tub/shower transfers w/assist (OT) Description: LTG: Patient will perform tub/shower transfers with assist, with/without cues using equipment (OT) Flowsheets (Taken 01/25/2024 1112) LTG: Pt will perform tub/shower stall transfers with assistance level of: Contact Guard/Touching assist LTG: Pt will perform tub/shower transfers from: Walk in shower

## 2024-01-25 NOTE — Progress Notes (Addendum)
 This RN at nurses station when bed alarm went off in patient's room. When walking into patient room patient lying on left side on floor. Patient reports I was trying to roll over and rolled too far. Staff assisted patient back to bed with maxi lift and skin check done. Patient denied any pain. Primary RN to notify attending.

## 2024-01-25 NOTE — Progress Notes (Signed)
 Inpatient Rehabilitation Center Individual Statement of Services  Patient Name:  Megan Fischer  Date:  01/25/2024  Welcome to the Inpatient Rehabilitation Center.  Our goal is to provide you with an individualized program based on your diagnosis and situation, designed to meet your specific needs.  With this comprehensive rehabilitation program, you will be expected to participate in at least 3 hours of rehabilitation therapies Monday-Friday, with modified therapy programming on the weekends.  Your rehabilitation program will include the following services:  Physical Therapy (PT), Occupational Therapy (OT), Speech Therapy (ST), 24 hour per day rehabilitation nursing, Therapeutic Recreaction (TR), Care Coordinator, Rehabilitation Medicine, Nutrition Services, and Pharmacy Services  Weekly team conferences will be held on Wednesday to discuss your progress.  Your Inpatient Rehabilitation Care Coordinator will talk with you frequently to get your input and to update you on team discussions.  Team conferences with you and your family in attendance may also be held.  Expected length of stay: 2-3 weeks  Overall anticipated outcome: CGA level  Depending on your progress and recovery, your program may change. Your Inpatient Rehabilitation Care Coordinator will coordinate services and will keep you informed of any changes. Your Inpatient Rehabilitation Care Coordinator's name and contact numbers are listed  below.  The following services may also be recommended but are not provided by the Inpatient Rehabilitation Center:   Home Health Rehabiltiation Services Outpatient Rehabilitation Services    Arrangements will be made to provide these services after discharge if needed.  Arrangements include referral to agencies that provide these services.  Your insurance has been verified to be:  [employer] Dual Complete Your primary doctor is:  Software engineer  Pertinent information will be shared with your doctor  and your insurance company.  Inpatient Rehabilitation Care Coordinator:  Di'Asia Loreli SIERRAS [phone] or ELIGAH BRINKS  Information discussed with and copy given to patient by: Raymonde Asberry MATSU, 01/25/2024, 12:05 PM

## 2024-01-25 NOTE — Plan of Care (Signed)
  Problem: RH Balance Goal: LTG Patient will maintain dynamic standing balance (PT) Description: LTG:  Patient will maintain dynamic standing balance with assistance during mobility activities (PT) Flowsheets (Taken 01/25/2024 1415) LTG: Pt will maintain dynamic standing balance during mobility activities with:: Contact Guard/Touching assist   Problem: Sit to Stand Goal: LTG:  Patient will perform sit to stand with assistance level (PT) Description: LTG:  Patient will perform sit to stand with assistance level (PT) Flowsheets (Taken 01/25/2024 1415) LTG: PT will perform sit to stand in preparation for functional mobility with assistance level: Contact Guard/Touching assist   Problem: RH Bed Mobility Goal: LTG Patient will perform bed mobility with assist (PT) Description: LTG: Patient will perform bed mobility with assistance, with/without cues (PT). Flowsheets (Taken 01/25/2024 1415) LTG: Pt will perform bed mobility with assistance level of: Supervision/Verbal cueing   Problem: RH Bed to Chair Transfers Goal: LTG Patient will perform bed/chair transfers w/assist (PT) Description: LTG: Patient will perform bed to chair transfers with assistance (PT). Flowsheets (Taken 01/25/2024 1415) LTG: Pt will perform Bed to Chair Transfers with assistance level: Contact Guard/Touching assist   Problem: RH Car Transfers Goal: LTG Patient will perform car transfers with assist (PT) Description: LTG: Patient will perform car transfers with assistance (PT). Flowsheets (Taken 01/25/2024 1415) LTG: Pt will perform car transfers with assist:: Minimal Assistance - Patient > 75%   Problem: RH Ambulation Goal: LTG Patient will ambulate in controlled environment (PT) Description: LTG: Patient will ambulate in a controlled environment, # of feet with assistance (PT). Flowsheets (Taken 01/25/2024 1415) LTG: Pt will ambulate in controlled environ  assist needed:: Contact Guard/Touching assist LTG: Ambulation  distance in controlled environment: 171ft Goal: LTG Patient will ambulate in home environment (PT) Description: LTG: Patient will ambulate in home environment, # of feet with assistance (PT). Flowsheets (Taken 01/25/2024 1415) LTG: Pt will ambulate in home environ  assist needed:: Contact Guard/Touching assist LTG: Ambulation distance in home environment: 75ft

## 2024-01-26 DIAGNOSIS — W19XXXA Unspecified fall, initial encounter: Secondary | ICD-10-CM | POA: Diagnosis not present

## 2024-01-26 DIAGNOSIS — I63531 Cerebral infarction due to unspecified occlusion or stenosis of right posterior cerebral artery: Secondary | ICD-10-CM | POA: Diagnosis not present

## 2024-01-26 DIAGNOSIS — E1169 Type 2 diabetes mellitus with other specified complication: Secondary | ICD-10-CM | POA: Diagnosis not present

## 2024-01-26 DIAGNOSIS — I10 Essential (primary) hypertension: Secondary | ICD-10-CM | POA: Diagnosis not present

## 2024-01-26 LAB — GLUCOSE, CAPILLARY
Glucose-Capillary: 149 mg/dL — ABNORMAL HIGH (ref 70–99)
Glucose-Capillary: 171 mg/dL — ABNORMAL HIGH (ref 70–99)
Glucose-Capillary: 171 mg/dL — ABNORMAL HIGH (ref 70–99)
Glucose-Capillary: 153 mg/dL — ABNORMAL HIGH (ref 70–99)

## 2024-01-26 NOTE — Progress Notes (Signed)
   01/26/24 1330  Assess: MEWS Score  Temp (!) 96.3 F (35.7 C)  BP 131/69  MAP (mmHg) 89  Pulse Rate (!) 55  Resp (!) 22  Level of Consciousness Alert  SpO2 90 %  O2 Device Room Air  Assess: MEWS Score  MEWS Temp 1  MEWS Systolic 0  MEWS Pulse 0  MEWS RR 1  MEWS LOC 0  MEWS Score 2  MEWS Score Color Yellow  Assess: if the MEWS score is Yellow or Red  Were vital signs accurate and taken at a resting state? Yes  Does the patient meet 2 or more of the SIRS criteria? Yes  Does the patient have a confirmed or suspected source of infection? No  MEWS guidelines implemented  Yes, yellow  Treat  MEWS Interventions Considered administering scheduled or prn medications/treatments as ordered  Take Vital Signs  Increase Vital Sign Frequency  Yellow: Q2hr x1, continue Q4hrs until patient remains green for 12hrs  Escalate  MEWS: Escalate Yellow: Discuss with charge nurse and consider notifying provider and/or RRT  Notify: Charge Nurse/RN  Name of Charge Nurse/RN Notified Kreg CROME RN  Provider Notification  Provider Name/Title [email or web address]  Date Provider Notified 01/26/24  Time Provider Notified 1330  Method of Notification Call  Notification Reason Change in status  Provider response Other (Comment) (continue to monitor, EKG already obtained following prior notification of tachycardia)  Date of Provider Response 01/26/24  Time of Provider Response 1331  Assess: SIRS CRITERIA  SIRS Temperature  1  SIRS Respirations  1  SIRS Pulse 0  SIRS WBC 0  SIRS Score Sum  2

## 2024-01-26 NOTE — Progress Notes (Addendum)
 Endorsing headache this morning. Intermittent moments of oxygen saturation going below 90% and one moment below 80%. Quickly returns above 90%. Intermittent moments of heart rate going above 110. Continues to endorse impairment with left peripheral vision.

## 2024-01-26 NOTE — Progress Notes (Signed)
 PROGRESS NOTE   Subjective/Complaints: Fall yesterday out of bed.  Patient reports she rolled out of bed during her sleep.  Denies  any significant injuries.  Head CT without acute changes.   ROS: Patient denies fever, new vision changes, dizziness, nausea, vomiting, diarrhea,  shortness of breath or chest pain, headache, or mood change.    Objective:   CT HEAD WO CONTRAST ( ) Result Date: 01/25/2024 EXAM: CT HEAD WITHOUT CONTRAST 01/25/2024 11:38:00 PM TECHNIQUE: CT of the head was performed without the administration of intravenous contrast. Automated exposure control, iterative reconstruction, and/or weight based adjustment of the mA/kV was utilized to reduce the radiation dose to as low as reasonably achievable. COMPARISON: 01/21/2024 CLINICAL HISTORY: Fall, eval for head injury. 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 FINDINGS: BRAIN AND VENTRICLES: Hypoattenuation throughout the right PCA territory consistent with known recent infarction. No acute hemorrhage. No extra-axial collection. No mass effect or midline shift. Incidental partially empty sella turcica. ORBITS: No acute abnormality. SINUSES: No acute abnormality. SOFT TISSUES AND SKULL: No acute soft tissue abnormality. No skull fracture. IMPRESSION: 1. Findings consistent with recent right PCA territory infarction; no acute hemorrhage or mass effect. 2. Incidental partially empty sella turcica. Electronically signed by: Franky Stanford MD 01/25/2024 11:50 PM EDT RP Workstation: [id number]   Recent Labs    01/25/24 0921  WBC 4.5  HGB 14.0  HCT 43.7  PLT 255   Recent Labs    01/25/24 1303  NA 140  K 4.1  CL 105  CO2 24  GLUCOSE 162*  BUN 23  CREATININE 1.50*  CALCIUM  9.4    Intake/Output Summary (Last 24 hours) at 01/26/2024 1218 Last data filed at 01/26/2024 1101 Gross per 24 hour  Intake 1000 ml  Output 200 ml  Net 800  ml        Physical Exam: Vital Signs Blood pressure (!) 153/95, pulse 83, temperature 98.5 F (36.9 C), temperature source Oral, resp. rate 18, height 5' 3 (1.6 m), weight 88 kg, SpO2 100%.  General: No apparent distress, getting ready to take shower with therapy HEENT: Head is normocephalic, atraumatic, sclera anicteric, oral mucosa moist Neck: Supple without JVD or lymphadenopathy Heart: Reg rate and rhythm.  Chest: CTA bilaterally without wheezes, rales, or rhonchi; no distress Abdomen: Soft, non-tender, non-distended, bowel sounds positive. Psych: Cooperative, appropriate Skin: Clean and intact without signs of breakdown Neuro: Alert and oriented x 4, follows simple commands, able to name and repeat, left homonymous hemianopsia, right gaze preference able to cross midline.  Left facial weakness. Left upper and lower extremity weakness more proximal than distal   MSK: healed TKA incisions, no joint abnormality noted    Assessment/Plan: 1. Functional deficits which require 3+ hours per day of interdisciplinary therapy in a comprehensive inpatient rehab setting. Physiatrist is providing close team supervision and 24 hour management of active medical problems listed below. Physiatrist and rehab team continue to assess barriers to discharge/monitor patient progress toward functional and medical goals  Care Tool:  Bathing    Body parts bathed by patient: Left arm, Chest, Front perineal area, Right upper leg, Left upper leg, Face   Body parts bathed  by helper: Abdomen, Right arm, Buttocks, Right lower leg, Left lower leg     Bathing assist Assist Level: Maximal Assistance - Patient 24 - 49%     Upper Body Dressing/Undressing Upper body dressing   What is the patient wearing?: Hospital gown only    Upper body assist Assist Level: Maximal Assistance - Patient 25 - 49%    Lower Body Dressing/Undressing Lower body dressing      What is the patient wearing?: Pants,  Incontinence brief     Lower body assist Assist for lower body dressing: Dependent - Patient 0%     Toileting Toileting    Toileting assist Assist for toileting: Dependent - Patient 0%     Transfers Chair/bed transfer  Transfers assist     Chair/bed transfer assist level: Moderate Assistance - Patient 50 - 74%     Locomotion Ambulation   Ambulation assist      Assist level: 2 helpers (modA with w/c follow for safety) Assistive device: Other (comment) (R hand rail) Max distance: 30'   Walk 10 feet activity   Assist     Assist level: 2 helpers (modA with w/c follow for safety) Assistive device: Other (comment) (R hand rail)   Walk 50 feet activity   Assist Walk 50 feet with 2 turns activity did not occur: Safety/medical concerns         Walk 150 feet activity   Assist Walk 150 feet activity did not occur: Safety/medical concerns         Walk 10 feet on uneven surface  activity   Assist Walk 10 feet on uneven surfaces activity did not occur: Safety/medical concerns         Wheelchair     Assist Is the patient using a wheelchair?: Yes Type of Wheelchair: Manual    Wheelchair assist level: Moderate Assistance - Patient 50 - 74% Max wheelchair distance: 42'    Wheelchair 50 feet with 2 turns activity    Assist        Assist Level: Moderate Assistance - Patient 50 - 74%   Wheelchair 150 feet activity     Assist      Assist Level: Total Assistance - Patient < 25%   Blood pressure (!) 153/95, pulse 83, temperature 98.5 F (36.9 C), temperature source Oral, resp. rate 18, height 5' 3 (1.6 m), weight 88 kg, SpO2 100%.  Medical Problem List and Plan: 1. Functional deficits secondary to R PCA CVA involving right thalamus, likely cardioembolic with A-fib despite Eliquis  versus large vessel disease             -patient may shower             -ELOS/Goals: 15 to 18 days             -Continue CIR  - Head CT after fall  without acute changes,  patient would like all for bed rails up will place nursing order   2.  Antithrombotics: -DVT/anticoagulation:  Pharmaceutical: Eliquis              -antiplatelet therapy: ECASA 3. Pain Management: tylenol  prn. Voltaren  gel qid to knee.  4. Mood/Behavior/Sleep: LCSW to follow for evaluation and support.             --Melatonin prn.              -antipsychotic agents: N/A 5. Neuropsych/cognition: This patient is capable of making decisions on her own behalf. 6. Skin/Wound Care: Routine pressure relief measures.  7. Fluids/Electrolytes/Nutrition: Monitor I/O. Check CMET in am.  8. A fib: Monitor HR TID--on metoprolol  --Eliquis  resumed 09/18 9. HTN: Monitor BP TID--on amlodipine , Lotensin  and Metoprolol .  Avoid hypotension and with long-term goal normotensive             -Fair control continue to monitor, internal medicine suggests outpatient on thiazide with benazepril  combo pill to reduce pill burden  -9/20 BP mildly elevated, continue current regimen for now would like to avoid causing hypotension    01/26/2024   10:16 AM 01/26/2024    7:45 AM 01/26/2024    7:37 AM  Vitals with BMI  Systolic 153 153 850  Diastolic 95 95 102  Pulse 83 83 67      10. CKD: BUN/SCr 30/1.40 @ admission.  --Baseline likely 1.3-1.5 in the last few months.              --Recheck in am.              - 9/19 reorder CMP, initial lab draw hemolyzed  -9/20 creatinine and BUN overall stable at 1.5/23, will continue to follow 11. T2DM: Hgb A1c- 6.5 and well controlled. Was on Semaglutide  1 mg, Amaryl  1 mg and Jardiance  25 mg PTA             --Jardiance  resumed on 09/18 --Monitor BS ac/hs and use SSI for elevated BS -9/19-20  fair control continue to monitor trend   CBG (last 3)  Recent Labs    01/25/24 2024 01/26/24 0555 01/26/24 1140  GLUCAP 149* 149* 171*      12. 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   13. Scoliosis/Spondylolisthesis: Has hx of chronic LBP w/BLE weakness --also endstage OA right knee--Diclofenac  gel qid 14. Hyperthyroid: Managed with Tapazole  5 mg daily.  15. Hyperlipidemia: LDL 123 w/goal < 70.  --On Crestor  20 mg (was increased) and Zetia .  16. Constipation             -Start miralax   - 9/20 LBM today continue current regimen and monitor        LOS: 2 days A FACE TO FACE EVALUATION WAS PERFORMED  Murray Collier 01/26/2024, 12:18 PM

## 2024-01-27 ENCOUNTER — Other Ambulatory Visit: Payer: Self-pay | Admitting: Cardiology

## 2024-01-27 DIAGNOSIS — I4891 Unspecified atrial fibrillation: Secondary | ICD-10-CM

## 2024-01-27 DIAGNOSIS — I63531 Cerebral infarction due to unspecified occlusion or stenosis of right posterior cerebral artery: Secondary | ICD-10-CM | POA: Diagnosis not present

## 2024-01-27 DIAGNOSIS — I10 Essential (primary) hypertension: Secondary | ICD-10-CM | POA: Diagnosis not present

## 2024-01-27 DIAGNOSIS — K5900 Constipation, unspecified: Secondary | ICD-10-CM | POA: Diagnosis not present

## 2024-01-27 DIAGNOSIS — E1169 Type 2 diabetes mellitus with other specified complication: Secondary | ICD-10-CM | POA: Diagnosis not present

## 2024-01-27 DIAGNOSIS — W19XXXD Unspecified fall, subsequent encounter: Secondary | ICD-10-CM

## 2024-01-27 LAB — GLUCOSE, CAPILLARY
Glucose-Capillary: 136 mg/dL — ABNORMAL HIGH (ref 70–99)
Glucose-Capillary: 154 mg/dL — ABNORMAL HIGH (ref 70–99)
Glucose-Capillary: 113 mg/dL — ABNORMAL HIGH (ref 70–99)
Glucose-Capillary: 163 mg/dL — ABNORMAL HIGH (ref 70–99)

## 2024-01-27 NOTE — Plan of Care (Signed)
  Problem: Consults Goal: RH STROKE PATIENT EDUCATION Description: See Patient Education module for education specifics  Outcome: Progressing   Problem: RH BOWEL ELIMINATION Goal: RH STG MANAGE BOWEL WITH ASSISTANCE Description: STG Manage Bowel with mod I Assistance. Outcome: Progressing Goal: RH STG MANAGE BOWEL W/MEDICATION W/ASSISTANCE Description: STG Manage Bowel with Medication with mod I Assistance. Outcome: Progressing   Problem: RH BLADDER ELIMINATION Goal: RH STG MANAGE BLADDER WITH ASSISTANCE Description: STG Manage Bladder With toileting Assistance Outcome: Progressing   Problem: RH SAFETY Goal: RH STG ADHERE TO SAFETY PRECAUTIONS W/ASSISTANCE/DEVICE Description: STG Adhere to Safety Precautions With cues Assistance/Device. Outcome: Progressing   Problem: RH PAIN MANAGEMENT Goal: RH STG PAIN MANAGED AT OR BELOW PT'S PAIN GOAL Description: < 4 with prns Outcome: Progressing   Problem: RH KNOWLEDGE DEFICIT Goal: RH STG INCREASE KNOWLEDGE OF DIABETES Description: Patient and dtr will be able to manage DM using educational resources for medications and dietary modification independently Outcome: Progressing Goal: RH STG INCREASE KNOWLEDGE OF HYPERTENSION Description: Patient and dtr will be able to manage HTN using educational resources for medications and dietary modification independently Outcome: Progressing Goal: RH STG INCREASE KNOWLEDGE OF DYSPHAGIA/FLUID INTAKE Description: Patient and dtr will be able to manage Dysphagia using educational resources for medications and dietary modification independently Outcome: Progressing Goal: RH STG INCREASE KNOWLEGDE OF HYPERLIPIDEMIA Description: Patient and dtr will be able to manage HLD using educational resources for medications and dietary modification independently Outcome: Progressing Goal: RH STG INCREASE KNOWLEDGE OF STROKE PROPHYLAXIS Description: Patient and dtr will be able to manage secondary risks using  educational resources for medications and dietary modification independently Outcome: Progressing   Problem: RH Vision Goal: RH LTG Vision (Specify) Outcome: Progressing

## 2024-01-27 NOTE — Progress Notes (Signed)
 Physical Therapy Session Note  Patient Details  Name: Megan Fischer MRN: [id number] Date of Birth: 18-Apr-1951  Today's Date: 01/27/2024 PT Individual Time: 8981-8884, 8699-8586 PT Individual Time Calculation (min): 57 min, 73 min   Short Term Goals: Week 1:  PT Short Term Goal 1 (Week 1): Pt will complete bed mobility with CGA PT Short Term Goal 2 (Week 1): Pt will complete bed<>chair transfers with minA and LRAD PT Short Term Goal 3 (Week 1): Pt will ambulate 76ft with minA and LRAD PT Short Term Goal 4 (Week 1): Pt will complete functional outcome measure to assess falls risk  Skilled Therapeutic Interventions/Progress Updates:      Treatment Session 1   Pt supine asleep in bed upon arrival. Pt awoken and agreeable to therapy. Pt denies any pain.   Discussed family bringing in shoes. Pt contacted pt son during session.   Pt performed squat/stand pivot transfer to R and L with mod-max A, verbal cues provided for technique/sequencing/UE positioning with emphasis on anterior weight shift.   Pt attempted gait with R handrail however opted to discontinue for safety 2/2 severe L LE buckling.   Pt performed sit to stand in // bars with mod-max A, with pt performing lateral weight shifting, verbal, visual and tactile cues provided for attention to L UE for emphasis on L LE tricep extension and midline orientation.   Pt supine in bed with all needs within reach and bed alarm on.   Treatment Session 2   Pt seated EOB upon arrival. Pt agreeable to therapy. Pt denies any pain.   Pt requesting to use the bathroom. Utilized steady for bed to toilet transfer 2/2 urgency.   Pt continent of bladder. Changed pt clothes while seated on BSC over toilet dependently for time/energy conservation.   Pt performed BSC to [facility] transfer with B UE support on grab bar and light mod A.   Pt ambulated 10 feet (each direction) forwards/backwards in // bars with +2 A for WC follow, verbal cues provided for  postural correction and UE positioning.   Pt ambulated 30 feet with L HHA and R UE support on handrail with +2 for WC follow, pt demos L LE knee hyperextension.   Pt self propelled WC with B LE and min-mod A x~40 feet at a time, verbla cues provided for technique and attention to obstacles on L hand side.   Pt demos significant posterior pelvic tilt and slumped posture while seated. Remainder of session focused on BITS balance for static seated postural control with emphasis on anterior weight shift-and scanning to R and L of screen to select letters alphabetically.   Stand pivot transfer WC to bed with light mod A.   Pt supine in bed with all needs within reach and bed alarm on.         Therapy Documentation Precautions:  Precautions Precautions: Fall Recall of Precautions/Restrictions: Impaired Precaution/Restrictions Comments: L homonymous hemianopsia, L hemi, B TKA's pending revision per family Restrictions Weight Bearing Restrictions Per Provider Order: No  Therapy/Group: Individual Therapy  [facility] Doreene Orris, [place], DPT  01/27/2024, 7:41 AM

## 2024-01-27 NOTE — Progress Notes (Signed)
 PROGRESS NOTE   Subjective/Complaints: No new concerns or complaints today.  Had a fall out of bed 2 nights ago but no issues last night.  ROS: Patient denies fever, new vision changes, dizziness, nausea, vomiting, diarrhea,  shortness of breath or chest pain, headache, or mood change.    Objective:   CT HEAD WO CONTRAST ( ) Result Date: 01/25/2024 EXAM: CT HEAD WITHOUT CONTRAST 01/25/2024 11:38:00 PM TECHNIQUE: CT of the head was performed without the administration of intravenous contrast. Automated exposure control, iterative reconstruction, and/or weight based adjustment of the mA/kV was utilized to reduce the radiation dose to as low as reasonably achievable. COMPARISON: 01/21/2024 CLINICAL HISTORY: Fall, eval for head injury. 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 FINDINGS: BRAIN AND VENTRICLES: Hypoattenuation throughout the right PCA territory consistent with known recent infarction. No acute hemorrhage. No extra-axial collection. No mass effect or midline shift. Incidental partially empty sella turcica. ORBITS: No acute abnormality. SINUSES: No acute abnormality. SOFT TISSUES AND SKULL: No acute soft tissue abnormality. No skull fracture. IMPRESSION: 1. Findings consistent with recent right PCA territory infarction; no acute hemorrhage or mass effect. 2. Incidental partially empty sella turcica. Electronically signed by: Franky Stanford MD 01/25/2024 11:50 PM EDT RP Workstation: [id number]   Recent Labs    01/25/24 0921  WBC 4.5  HGB 14.0  HCT 43.7  PLT 255   Recent Labs    01/25/24 1303  NA 140  K 4.1  CL 105  CO2 24  GLUCOSE 162*  BUN 23  CREATININE 1.50*  CALCIUM  9.4    Intake/Output Summary (Last 24 hours) at 01/27/2024 1547 Last data filed at 01/27/2024 0809 Gross per 24 hour  Intake 320 ml  Output --  Net 320 ml        Physical Exam: Vital Signs Blood pressure  138/80, pulse 79, temperature 97.9 F (36.6 C), resp. rate 18, height 5' 3 (1.6 m), weight 83.3 kg, SpO2 100%.  General: No apparent distress, appears comfortable laying in bed HEENT: Head is normocephalic, atraumatic, sclera anicteric, oral mucosa moist Neck: Supple without JVD or lymphadenopathy Heart: Reg rate and rhythm.  Chest: CTA bilaterally without wheezes, rales, or rhonchi; no distress Abdomen: Soft, non-tender, non-distended, bowel sounds positive. Psych: Cooperative, appropriate Skin: Clean and intact without signs of breakdown Neuro: Alert and oriented x 4, follows simple commands, able to name and repeat, left homonymous hemianopsia, right gaze preference able to cross midline.  Left facial weakness.  Right upper and lower extremity strength appear to be intact. Left upper 4-5 and lower extremity 3-4 out of 5 more proximal and distal  MSK: healed TKA incisions, no joint abnormality noted    Assessment/Plan: 1. Functional deficits which require 3+ hours per day of interdisciplinary therapy in a comprehensive inpatient rehab setting. Physiatrist is providing close team supervision and 24 hour management of active medical problems listed below. Physiatrist and rehab team continue to assess barriers to discharge/monitor patient progress toward functional and medical goals  Care Tool:  Bathing    Body parts bathed by patient: Left arm, Chest, Front perineal area, Right upper leg, Left upper leg, Face   Body parts bathed by  helper: Abdomen, Right arm, Buttocks, Right lower leg, Left lower leg     Bathing assist Assist Level: Maximal Assistance - Patient 24 - 49%     Upper Body Dressing/Undressing Upper body dressing   What is the patient wearing?: Hospital gown only    Upper body assist Assist Level: Maximal Assistance - Patient 25 - 49%    Lower Body Dressing/Undressing Lower body dressing      What is the patient wearing?: Pants, Incontinence brief     Lower  body assist Assist for lower body dressing: Dependent - Patient 0%     Toileting Toileting    Toileting assist Assist for toileting: Dependent - Patient 0%     Transfers Chair/bed transfer  Transfers assist     Chair/bed transfer assist level: Moderate Assistance - Patient 50 - 74%     Locomotion Ambulation   Ambulation assist      Assist level: 2 helpers (modA with w/c follow for safety) Assistive device: Other (comment) (R hand rail) Max distance: 30'   Walk 10 feet activity   Assist     Assist level: 2 helpers (modA with w/c follow for safety) Assistive device: Other (comment) (R hand rail)   Walk 50 feet activity   Assist Walk 50 feet with 2 turns activity did not occur: Safety/medical concerns         Walk 150 feet activity   Assist Walk 150 feet activity did not occur: Safety/medical concerns         Walk 10 feet on uneven surface  activity   Assist Walk 10 feet on uneven surfaces activity did not occur: Safety/medical concerns         Wheelchair     Assist Is the patient using a wheelchair?: Yes Type of Wheelchair: Manual    Wheelchair assist level: Moderate Assistance - Patient 50 - 74% Max wheelchair distance: 62'    Wheelchair 50 feet with 2 turns activity    Assist        Assist Level: Moderate Assistance - Patient 50 - 74%   Wheelchair 150 feet activity     Assist      Assist Level: Total Assistance - Patient < 25%   Blood pressure 138/80, pulse 79, temperature 97.9 F (36.6 C), resp. rate 18, height 5' 3 (1.6 m), weight 83.3 kg, SpO2 100%.  Medical Problem List and Plan: 1. Functional deficits secondary to R PCA CVA involving right thalamus, likely cardioembolic with A-fib despite Eliquis  versus large vessel disease             -patient may shower             -ELOS/Goals: 15 to 18 days             -Continue CIR  - Head CT after fall without acute changes,  patient would like all for bed rails up  will place nursing order   - IPOC note completed  2.  Antithrombotics: -DVT/anticoagulation:  Pharmaceutical: Eliquis              -antiplatelet therapy: ECASA 3. Pain Management: tylenol  prn. Voltaren  gel qid to knee.  4. Mood/Behavior/Sleep: LCSW to follow for evaluation and support.             --Melatonin prn.              -antipsychotic agents: N/A 5. Neuropsych/cognition: This patient is capable of making decisions on her own behalf. 6. Skin/Wound Care: Routine pressure relief measures.  7. Fluids/Electrolytes/Nutrition: Monitor I/O. Check CMET in am.  8. A fib: Monitor HR TID--on metoprolol  --Eliquis  resumed 09/18 9. HTN: Monitor BP TID--on amlodipine , Lotensin  and Metoprolol .  Avoid hypotension and with long-term goal normotensive             -Fair control continue to monitor, internal medicine suggests outpatient on thiazide with benazepril  combo pill to reduce pill burden  -9/20 BP mildly elevated, continue current regimen for now would like to avoid causing hypotension  -9/21 fair control continue current regimen    01/27/2024    3:42 PM 01/27/2024    8:05 AM 01/27/2024    5:03 AM  Vitals with BMI  Weight   183 lbs 10 oz  BMI   32.54  Systolic 138 147   Diastolic 80 74   Pulse 79 85       10. CKD: BUN/SCr 30/1.40 @ admission.  --Baseline likely 1.3-1.5 in the last few months.              --Recheck in am.              - 9/19 reorder CMP, initial lab draw hemolyzed  -9/20 creatinine and BUN overall stable at 1.5/23, will continue to follow  Recheck tomorrow 11. T2DM: Hgb A1c- 6.5 and well controlled. Was on Semaglutide  1 mg, Amaryl  1 mg and Jardiance  25 mg PTA             --Jardiance  resumed on 09/18 --Monitor BS ac/hs and use SSI for elevated BS -9/19-21  fair control continue to monitor trend   CBG (last 3)  Recent Labs    01/26/24 2037 01/27/24 0617 01/27/24 1131  GLUCAP 153* 136* 154*      12. 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  13. Scoliosis/Spondylolisthesis: Has hx of chronic LBP w/BLE weakness --also endstage OA right knee--Diclofenac  gel qid 14. Hyperthyroid: Managed with Tapazole  5 mg daily.  15. Hyperlipidemia: LDL 123 w/goal < 70.  --On Crestor  20 mg (was increased) and Zetia .  16. Constipation             -Start miralax   - 9/20 LBM today continue current regimen and monitor  -9/21 LBM yesterday continue to follow        LOS: 3 days A FACE TO FACE EVALUATION WAS PERFORMED  Murray Collier 01/27/2024, 3:47 PM

## 2024-01-27 NOTE — Progress Notes (Signed)
 Occupational Therapy Session Note  Patient Details  Name: Megan Fischer MRN: [id number] Date of Birth: 28-Jan-1951  Today's Date: 01/27/2024 OT Individual Time: 0800-0900 OT Individual Time Calculation (min): 60 min    Short Term Goals: Week 1:  OT Short Term Goal 1 (Week 1): Patient to perform upper body dressing with Min assist OT Short Term Goal 2 (Week 1): Patient to perform toilet transfer with Mod assist of one OT Short Term Goal 3 (Week 1): Patient to perform shower seated with Mod assist and AE as needed  Skilled Therapeutic Interventions/Progress Updates:    Patient resting in bed with family present at the time of arrival. Patient indicated that she rested well with no pain to report. The pt was in agreement with completing BADL related task in showering. The pt was able to come from supine in bed to EOB using the stedy at Mod to MinA coming into stand using the bar of the stedy and momentum. The pt was transported to the shower and was able to come into stand for doff her lower body garments, a brief with MaxA. The pt was able to lower herself to the shower chair with Mod to MinA. The pt was able to doff her hospital gown with MinA for the fasteners. The pt was able to bathe her UB with s/uA, she was able to bath the upper LB with s/uA. The pt was MaxA to Dep with the distal LE inclusive of her feet.  The pt was able to dry off and apply lotion and deo with s/uA. The pt was able to donn her over head shirt with MinA. The pt was Dep to  MaxA with donning her brief and pants.  The pt was Dep with her non-skid sock.  The pt was able to come into stand using the stedy for transferring from the shower seat at Mod to [facility]. The pt was transported to the sink and was able to brush her teeth and rinse her mouth with s/uA.  The pt was transferred to the w/c at [facility] to [facility] using the stedy with the foot rest put in place and all additional needs addressed prior to exiting the room. The chair alarm was  put in place an activated. Therapy Documentation Precautions:  Precautions Precautions: Fall Recall of Precautions/Restrictions: Impaired Precaution/Restrictions Comments: L homonymous hemianopsia, L hemi, B TKA's pending revision per family Restrictions Weight Bearing Restrictions Per Provider Order: No   Therapy/Group: Individual Therapy  [name] 01/27/2024, 4:17 PM

## 2024-01-27 NOTE — IPOC Note (Signed)
 Overall Plan of Care [facility]) Patient Details Name: Megan Fischer MRN: [id number] DOB: [birth year]  Admitting Diagnosis: Acute ischemic right PCA stroke [facility])  Hospital Problems: Principal Problem:   Acute ischemic right PCA stroke [facility])     Functional Problem List: Nursing Safety, Bowel, Endurance, Medication Management, Pain, Bladder  PT Balance, Endurance, Motor, Perception, Safety, Sensory, Skin Integrity  OT Balance, Safety, Sensory, Vision, Endurance, Motor, Perception  SLP Cognition  TR         Basic ADL's: OT Grooming, Bathing, Dressing, Toileting     Advanced  ADL's: OT Simple Meal Preparation     Transfers: PT Bed Mobility, Bed to Chair, Customer service manager, Tub/Shower     Locomotion: PT Ambulation, Psychologist, prison and probation services, Stairs     Additional Impairments: OT Fuctional Use of Upper Extremity  SLP Social Cognition   Problem Solving, Memory, Attention  TR      Anticipated Outcomes Item Anticipated Outcome  Self Feeding Independent  Swallowing      Basic self-care  Independent  Toileting  CGA   Bathroom Transfers CGA  Bowel/Bladder  manage bowel w mod I assist, bladder w toileting  Transfers  CGA  Locomotion  CGA  Communication     Cognition  supervision  Pain  Pain <4 with prns  Safety/Judgment  Manage safety w cues   Therapy Plan: PT Intensity: Minimum of 1-2 x/day ,45 to 90 minutes PT Frequency: 5 out of 7 days PT Duration Estimated Length of Stay: 2 weeks OT Intensity: Minimum of 1-2 x/day, 45 to 90 minutes OT Frequency: 5 out of 7 days OT Duration/Estimated Length of Stay: 3-4 weeks SLP Intensity: Minumum of 1-2 x/day, 30 to 90 minutes SLP Frequency: 3 to 5 out of 7 days SLP Duration/Estimated Length of Stay: 2 weeks   Team Interventions: Nursing Interventions Bowel Management, Disease Management/Prevention, Discharge Planning, Medication Management, Dysphagia/Aspiration Precaution Training, Pain Management, Patient/Family Education   PT interventions Ambulation/gait training, Cognitive remediation/compensation, Discharge planning, DME/adaptive equipment instruction, Functional mobility training, Pain management, Psychosocial support, Splinting/orthotics, Therapeutic Activities, UE/LE Strength taining/ROM, Visual/perceptual remediation/compensation, Therapeutic Exercise, Wheelchair propulsion/positioning, UE/LE Coordination activities, Skin care/wound management, Stair training, Patient/family education, Neuromuscular re-education, Functional electrical stimulation, Disease management/prevention, Firefighter, Warden/ranger  OT Interventions Warden/ranger, Discharge planning, Pain management, Self Care/advanced ADL retraining, Therapeutic Activities, UE/LE Coordination activities, Disease mangement/prevention, Functional mobility training, Patient/family education, Therapeutic Exercise, Visual/perceptual remediation/compensation, Wheelchair propulsion/positioning, UE/LE Strength taining/ROM, Neuromuscular re-education, DME/adaptive equipment instruction, Community reintegration  SLP Interventions Cognitive remediation/compensation, Internal/external aids, Financial trader, Therapeutic Activities, Functional tasks, Patient/family education  TR Interventions    SW/CM Interventions Discharge Planning, Patient/Family Education, Psychosocial Support   Barriers to Discharge MD  Medical stability  Nursing Decreased caregiver support, Home environment access/layout independent, occasional RW use, recently more unsteady (last week or 2), doesn't drive but no assist for ADLs/mobility/iADls (fiances and meds)  PT Decreased caregiver support, Home environment access/layout, Lack of/limited family support, Insurance for SNF coverage, Weight, Medication compliance    OT None    SLP      SW Insurance for SNF coverage     Team Discharge Planning: Destination: PT-Home ,OT- Home , SLP-Home Projected  Follow-up: PT-Home health PT, 24 hour supervision/assistance, OT-  Home health OT, SLP-Outpatient SLP Projected Equipment Needs: PT-To be determined, OT- 3 in 1 bedside comode, Tub/shower bench, To be determined, SLP-None recommended by SLP Equipment Details: PT- , OT-Patient lives in senior housing and may be able to have grab bars added through the facility. Patient/family  involved in discharge planning: PT- Patient,  OT-Patient, Family member/caregiver, SLP-Patient  MD ELOS: 2-3 weeks Medical Rehab Prognosis:  Good Assessment: The patient has been admitted for CIR therapies with the diagnosis of R PCA CVA involving right thalamus, likely cardioembolic with A-fib despite Eliquis  versus large vessel disease . The team will be addressing functional mobility, strength, stamina, balance, safety, adaptive techniques and equipment, self-care, bowel and bladder mgt, patient and caregiver education. Goals have been set at sup. Anticipated discharge destination is home.        See Team Conference Notes for weekly updates to the plan of care

## 2024-01-28 DIAGNOSIS — N179 Acute kidney failure, unspecified: Secondary | ICD-10-CM

## 2024-01-28 DIAGNOSIS — N189 Chronic kidney disease, unspecified: Secondary | ICD-10-CM | POA: Diagnosis not present

## 2024-01-28 DIAGNOSIS — I63531 Cerebral infarction due to unspecified occlusion or stenosis of right posterior cerebral artery: Secondary | ICD-10-CM | POA: Diagnosis not present

## 2024-01-28 DIAGNOSIS — E118 Type 2 diabetes mellitus with unspecified complications: Secondary | ICD-10-CM

## 2024-01-28 LAB — CBC
HCT: 38 % (ref 36.0–46.0)
Hemoglobin: 12.3 g/dL (ref 12.0–15.0)
MCH: 29 pg (ref 26.0–34.0)
MCHC: 32.4 g/dL (ref 30.0–36.0)
MCV: 89.6 fL (ref 80.0–100.0)
Platelets: 200 K/uL (ref 150–400)
RBC: 4.24 MIL/uL (ref 3.87–5.11)
RDW: 14.9 % (ref 11.5–15.5)
WBC: 4.7 K/uL (ref 4.0–10.5)
nRBC: 0 % (ref 0.0–0.2)

## 2024-01-28 LAB — BASIC METABOLIC PANEL WITH GFR
Anion gap: 12 (ref 5–15)
BUN: 34 mg/dL — ABNORMAL HIGH (ref 8–23)
CO2: 20 mmol/L — ABNORMAL LOW (ref 22–32)
Calcium: 8.9 mg/dL (ref 8.9–10.3)
Chloride: 111 mmol/L (ref 98–111)
Creatinine, Ser: 1.67 mg/dL — ABNORMAL HIGH (ref 0.44–1.00)
GFR, Estimated: 32 mL/min — ABNORMAL LOW (ref >60)
Glucose, Bld: 148 mg/dL — ABNORMAL HIGH (ref 70–99)
Potassium: 4.3 mmol/L (ref 3.5–5.1)
Sodium: 143 mmol/L (ref 135–145)

## 2024-01-28 LAB — GLUCOSE, CAPILLARY
Glucose-Capillary: 140 mg/dL — ABNORMAL HIGH (ref 70–99)
Glucose-Capillary: 150 mg/dL — ABNORMAL HIGH (ref 70–99)
Glucose-Capillary: 195 mg/dL — ABNORMAL HIGH (ref 70–99)
Glucose-Capillary: 144 mg/dL — ABNORMAL HIGH (ref 70–99)

## 2024-01-28 NOTE — Progress Notes (Signed)
 Occupational Therapy Session Note  Patient Details  Name: Megan Fischer MRN: [id number] Date of Birth: Apr 27, 1951  Today's Date: 01/28/2024 OT Individual Time: [phone] OT Individual Time Calculation (min): 59 min    Short Term Goals: Week 1:  OT Short Term Goal 1 (Week 1): Patient to perform upper body dressing with Min assist OT Short Term Goal 2 (Week 1): Patient to perform toilet transfer with Mod assist of one OT Short Term Goal 3 (Week 1): Patient to perform shower seated with Mod assist and AE as needed  Skilled Therapeutic Interventions/Progress Updates: Patient received resting in bed. Assisted patient to sit at [facility] with Mod assist. Agreeable to OT treatment and requesting to go to the bathroom. Patient transferred to [facility] over toilet using the STEDY. Patient stood with Moderate assist using BUE's to pull up. See list below for ADL levels. After completing ADL's patient taken to the therapy gym in the e/c for LUE NMES activities working on gross grasp release and FM manipulation of items with encouraged visual connection due to sensory deficits. Patient with fair manipulative skills but is quick to drop or loose control of objects due to decreased visual attention and light touch sensation. Patient with good participation during treatment, but requesting to go back to bed. Assisted patient Max of 2 back to bed. Continue with skilled OT POC.      Therapy Documentation Precautions:  Precautions Precautions: Fall Recall of Precautions/Restrictions: Impaired Precaution/Restrictions Comments: L homonymous hemianopsia, L hemi, B TKA's pending revision per family Restrictions Weight Bearing Restrictions Per Provider Order: No General:   Vital Signs:   Pain: Pain Assessment Pain Scale: Faces Pain Score: 3  Faces Pain Scale: Hurts a little bit Pain Location: Head Pain Intervention(s): Medication (See eMAR) ADL: ADL Eating: Set up Where Assessed-Eating: Bed level Grooming:  Setup Where Assessed-Grooming: Sitting at sink Upper Body Bathing: Min assistance Where Assessed-Upper Body Bathing: seated at sink Lower Body Bathing: Maximal assistance Where Assessed-Lower Body Bathing: sitting at sink Upper Body Dressing: Mod assistance Where Assessed-Upper Body Dressing: w/c Lower Body Dressing: Dependent Where Assessed-Lower Body Dressing: BSC Toileting: Dependent Where Assessed-Toileting: Teacher, adult education: Dependent Statistician Method: [employer]: Extra wide drop arm bedside commode, Grab bars    Therapy/Group: Individual Therapy  [name] 01/28/2024, 1:07 PM

## 2024-01-28 NOTE — Progress Notes (Addendum)
 PROGRESS NOTE   Subjective/Complaints: Pt up in bed. No new complaints except for people messing with her (referring to therapies). Pain controlled  ROS: Patient denies fever, rash, sore throat, blurred vision, dizziness, nausea, vomiting, diarrhea, cough, shortness of breath or chest pain, joint or back/neck pain, headache, or mood change.     Objective:   No results found.  Recent Labs    01/28/24 0525  WBC 4.7  HGB 12.3  HCT 38.0  PLT 200   Recent Labs    01/25/24 1303 01/28/24 0525  NA 140 143  K 4.1 4.3  CL 105 111  CO2 24 20*  GLUCOSE 162* 148*  BUN 23 34*  CREATININE 1.50* 1.67*  CALCIUM  9.4 8.9    Intake/Output Summary (Last 24 hours) at 01/28/2024 1000 Last data filed at 01/27/2024 1700 Gross per 24 hour  Intake 247 ml  Output --  Net 247 ml        Physical Exam: Vital Signs Blood pressure (!) 143/84, pulse 87, temperature 98.5 F (36.9 C), resp. rate 18, height 5' 3 (1.6 m), weight 83.3 kg, SpO2 99%.  Constitutional: No distress . Vital signs reviewed. HEENT: NCAT, EOMI, oral membranes moist Neck: supple Cardiovascular: RRR without murmur. No JVD    Respiratory/Chest: CTA Bilaterally without wheezes or rales. Normal effort    GI/Abdomen: BS +, non-tender, non-distended Ext: no clubbing, cyanosis, tr to 1+ BLE edema Psych: pleasant and cooperative  Skin: Clean and intact without signs of breakdown Neuro: Alert and oriented x 4, follows simple commands, able to name and repeat, left homonymous hemianopsia, right gaze preference able to cross midline.  Left facial weakness.  Right upper and lower extremity strength appear to be intact. Left upper 4-5 and lower extremity 3-4 out of 5 more proximal and distal--similar motor exam today.   MSK: healed TKA incisions, no joint abnormality noted    Assessment/Plan: 1. Functional deficits which require 3+ hours per day of interdisciplinary  therapy in a comprehensive inpatient rehab setting. Physiatrist is providing close team supervision and 24 hour management of active medical problems listed below. Physiatrist and rehab team continue to assess barriers to discharge/monitor patient progress toward functional and medical goals  Care Tool:  Bathing    Body parts bathed by patient: Left arm, Chest, Front perineal area, Right upper leg, Left upper leg, Face   Body parts bathed by helper: Abdomen, Right arm, Buttocks, Right lower leg, Left lower leg     Bathing assist Assist Level: Maximal Assistance - Patient 24 - 49%     Upper Body Dressing/Undressing Upper body dressing   What is the patient wearing?: Hospital gown only    Upper body assist Assist Level: Maximal Assistance - Patient 25 - 49%    Lower Body Dressing/Undressing Lower body dressing      What is the patient wearing?: Pants, Incontinence brief     Lower body assist Assist for lower body dressing: Dependent - Patient 0%     Toileting Toileting    Toileting assist Assist for toileting: Dependent - Patient 0%     Transfers Chair/bed transfer  Transfers assist     Chair/bed transfer assist level:  Moderate Assistance - Patient 50 - 74%     Locomotion Ambulation   Ambulation assist      Assist level: 2 helpers (modA with w/c follow for safety) Assistive device: Other (comment) (R hand rail) Max distance: 30'   Walk 10 feet activity   Assist     Assist level: 2 helpers (modA with w/c follow for safety) Assistive device: Other (comment) (R hand rail)   Walk 50 feet activity   Assist Walk 50 feet with 2 turns activity did not occur: Safety/medical concerns         Walk 150 feet activity   Assist Walk 150 feet activity did not occur: Safety/medical concerns         Walk 10 feet on uneven surface  activity   Assist Walk 10 feet on uneven surfaces activity did not occur: Safety/medical concerns          Wheelchair     Assist Is the patient using a wheelchair?: Yes Type of Wheelchair: Manual    Wheelchair assist level: Moderate Assistance - Patient 50 - 74% Max wheelchair distance: 47'    Wheelchair 50 feet with 2 turns activity    Assist        Assist Level: Moderate Assistance - Patient 50 - 74%   Wheelchair 150 feet activity     Assist      Assist Level: Total Assistance - Patient < 25%   Blood pressure (!) 143/84, pulse 87, temperature 98.5 F (36.9 C), resp. rate 18, height 5' 3 (1.6 m), weight 83.3 kg, SpO2 99%.  Medical Problem List and Plan: 1. 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  Antithrombotics: -DVT/anticoagulation:  Pharmaceutical: Eliquis              -antiplatelet therapy: ECASA 3. Pain Management: tylenol  prn. Voltaren  gel qid to knee.  4. Mood/Behavior/Sleep: LCSW to follow for evaluation and support.             --Melatonin prn.              -antipsychotic agents: N/A 5. Neuropsych/cognition: This patient is capable of making decisions on her own behalf. 6. Skin/Wound Care: Routine pressure relief measures.  7. Fluids/Electrolytes/Nutrition: Monitor I/O. Check CMET in am.  8. A fib: Monitor HR TID--on metoprolol  --Eliquis  resumed 09/18 9. HTN: Monitor BP TID--on amlodipine , Lotensin  and Metoprolol .  Avoid hypotension and with long-term goal normotensive             -Fair control continue to monitor, internal medicine suggests outpatient on thiazide with benazepril  combo pill to reduce pill burden  -9/20 BP mildly elevated, continue current regimen for now would like to avoid causing  hypotension  -9/22 some fluctuation but reasonable control    01/28/2024    4:18 AM 01/27/2024    7:35 PM 01/27/2024    3:42 PM  Vitals with BMI  Systolic 143 159 861  Diastolic 84 77 80  Pulse 87 78 79  10. CKD: BUN/SCr 30/1.40 @ admission.  --Baseline likely 1.3-1.5 in the last few months.              --Recheck in am.              - 9/19 reorder CMP, initial lab draw hemolyzed  -9/22 BUN/Cr   elevated beyond baseline today but within her variance going back to June labs.  Jardiance  resumed 9/18 which should have helped. She is on lotensin , however. Consider holding if further trend up   -for now encourage fluids   -recheck BMET in AM 11. T2DM: Hgb A1c- 6.5 and well controlled. Was on Semaglutide  1 mg, Amaryl  1 mg and Jardiance  25 mg PTA             --Jardiance  resumed on 09/18 --Monitor BS ac/hs and use SSI for elevated BS -9/19-22  fair control continue to monitor trend   CBG (last 3)  Recent Labs    01/27/24 1652 01/27/24 2149 01/28/24 0559  GLUCAP 113* 163* 140*      12. 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  13. Scoliosis/Spondylolisthesis: Has hx of chronic LBP w/BLE weakness --also endstage OA right knee--Diclofenac  gel qid 14. Hyperthyroid: Managed with Tapazole  5 mg daily.  15. Hyperlipidemia: LDL 123 w/goal < 70.  --On Crestor  20 mg (was increased) and Zetia .  16. Constipation             -Start miralax   -9/20 LBM         LOS: 4 days A FACE TO FACE EVALUATION WAS PERFORMED  Megan Fischer 01/28/2024, 10:00 AM

## 2024-01-28 NOTE — Telephone Encounter (Signed)
 Prescription refill request for Eliquis  received. Indication:AFIB Last office visit:8/25 Scr:1.67  9/25 Age: 73 Weight:83.3  kg  Prescription refilled

## 2024-01-28 NOTE — Plan of Care (Signed)
  Problem: RH BLADDER ELIMINATION Goal: RH STG MANAGE BLADDER WITH ASSISTANCE Description: STG Manage Bladder With toileting Assistance Outcome: Progressing

## 2024-01-28 NOTE — Progress Notes (Signed)
 Patient ID: Megan Fischer, female   DOB: 1950/11/17, 73 y.o.   MRN: [id number] Met with the patient to review current medical situation, rehab process, team conference and plan of care. Patient asking about risk for CVA; had no precursors and was taking Eliquis  as ordered for A-fib, managing DM (A1C 6.5). Reviewed secondary risk including HTN, HLD, (LDL 123/Trig 77) on Crestor  and Zetia , DM A-fib and SVD. Discussed medications and dietary modification recommendations for a CMM diet. Patient is anxious to get on with life; reports son moving to [place] to assist after discharge. Continue to follow along to address educational needs to facilitate preparation for discharge. Megan Fischer

## 2024-01-28 NOTE — Progress Notes (Signed)
 Speech Language Pathology Daily Session Note  Patient Details  Name: Megan Fischer MRN: [id number] Date of Birth: 27-Dec-1950  Today's Date: 01/28/2024 SLP Individual Time: 1130-1200 and 1400-1500 SLP Individual Time Calculation (min): 30 min and 60 min  Short Term Goals: Week 1: SLP Short Term Goal 1 (Week 1): Patient will attend to the L during functional therapy tasks to identify L presenting task components in 4/5 opportunities given min assist. SLP Short Term Goal 2 (Week 1): Patient will recall and manipulate functional information during problem solving tasks in 80% of trials given min assist. SLP Short Term Goal 3 (Week 1): Patient will solve mildly complex functional problems with 80% accuracy given overall min assist.  Skilled Therapeutic Interventions:   Session 1: SLP conducted skilled therapy session targeting cognition goals. SLP facilitated categorization task to target L neglect and sustained attention. Patient exhibited difficulty with task execution requiring mod cues to generate 3/3 answers across 6 trials. She also benefited from increased processing time to identify appropriate answer. Patient required mod cues for sustained attention. She frequently distracted herself, was tangential requiring frequent redirection. Patient was left in room with call bell in reach and alarm set. SLP will continue to target goals per plan of care.     Session 2: SLP conducted skilled therapy session targeting cognitive goals. SLP targeted reasoning skills via verbal and written task presenting patient with various scenarios and asking patient to provide responses. Patient demonstrated disorganized thinking and frequent self-distraction required mod cues to complete task despite only needing supervision-min assist to state potential answers to each question. Patient then began word search task where she benefited from mod assist to locate letter sequences. Patient was left in room with call bell  in reach and alarm set. SLP will continue to target goals per plan of care.        Pain  None  Therapy/Group: Individual Therapy  Jacorion Klem, M.A., CCC-SLP  Onesti Bonfiglio A Sargent Mankey 01/28/2024, 1:21 PM

## 2024-01-28 NOTE — Progress Notes (Signed)
 Physical Therapy Session Note  Patient Details  Name: Megan Fischer MRN: [id number] Date of Birth: 1951/04/24  Today's Date: 01/28/2024 PT Individual Time: 0800-0830, 8699-8672 PT Individual Time Calculation (min): 30 min, 27 min  Short Term Goals: Week 1:  PT Short Term Goal 1 (Week 1): Pt will complete bed mobility with CGA PT Short Term Goal 2 (Week 1): Pt will complete bed<>chair transfers with minA and LRAD PT Short Term Goal 3 (Week 1): Pt will ambulate 17ft with minA and LRAD PT Short Term Goal 4 (Week 1): Pt will complete functional outcome measure to assess falls risk  Skilled Therapeutic Interventions/Progress Updates:      Treatment Session 1  Pt supine in bed upon arrival. Pt agreeable to therapy with increased encouragement. Pt endorses headache, notified nurse, nurse present at start of session to adminster medications.   Pt appears to be more fatigued overall today vitals assessed BP 150/85, nurse aware.   Supine to sit, sit to supine with max A, verbal and tactile cues provided for initiation.   Pt donned/doffed shirt and brief with total A, while seated EOB with min A, verbal/tactile cues provided for correction of posterior bias.   Pt performed sit to stand with mod-max A to donn pants over buttocks.   Pt supine in bed with all needs within reach and bed alarm on.   Treatment Session 2   Pt supine in bed upon arrival. Pt agreeable to therapy. Pt denies any pain.   Supine to sit/sit to supine: with mod A/max A, verbal and tactile cues provided for initiation and to sustain attention.   Stand pivot transfer bed<>WC with max A.   Gait x20 feet with R handrail and L HHA, with heavy mod-max A for postural control, verbal and tactiel cues provided for correction of L knee hyperextension in stance.   Pt supine in bed at end of session with all needs within reach and bed alarm on.       Therapy Documentation Precautions:  Precautions Precautions:  Fall Recall of Precautions/Restrictions: Impaired Precaution/Restrictions Comments: L homonymous hemianopsia, L hemi, B TKA's pending revision per family Restrictions Weight Bearing Restrictions Per Provider Order: No   Therapy/Group: Individual Therapy  [facility] Doreene Orris, [place], DPT  01/28/2024, 7:47 AM

## 2024-01-29 DIAGNOSIS — I10 Essential (primary) hypertension: Secondary | ICD-10-CM | POA: Diagnosis not present

## 2024-01-29 DIAGNOSIS — E1169 Type 2 diabetes mellitus with other specified complication: Secondary | ICD-10-CM | POA: Diagnosis not present

## 2024-01-29 DIAGNOSIS — K5900 Constipation, unspecified: Secondary | ICD-10-CM | POA: Diagnosis not present

## 2024-01-29 DIAGNOSIS — I63531 Cerebral infarction due to unspecified occlusion or stenosis of right posterior cerebral artery: Secondary | ICD-10-CM | POA: Diagnosis not present

## 2024-01-29 LAB — GLUCOSE, CAPILLARY
Glucose-Capillary: 131 mg/dL — ABNORMAL HIGH (ref 70–99)
Glucose-Capillary: 175 mg/dL — ABNORMAL HIGH (ref 70–99)
Glucose-Capillary: 147 mg/dL — ABNORMAL HIGH (ref 70–99)

## 2024-01-29 LAB — BASIC METABOLIC PANEL WITH GFR
Anion gap: 14 (ref 5–15)
BUN: 35 mg/dL — ABNORMAL HIGH (ref 8–23)
CO2: 21 mmol/L — ABNORMAL LOW (ref 22–32)
Calcium: 9.1 mg/dL (ref 8.9–10.3)
Chloride: 108 mmol/L (ref 98–111)
Creatinine, Ser: 1.48 mg/dL — ABNORMAL HIGH (ref 0.44–1.00)
GFR, Estimated: 37 mL/min — ABNORMAL LOW (ref >60)
Glucose, Bld: 125 mg/dL — ABNORMAL HIGH (ref 70–99)
Potassium: 4.1 mmol/L (ref 3.5–5.1)
Sodium: 143 mmol/L (ref 135–145)

## 2024-01-29 MED ORDER — CAMPHOR-MENTHOL 0.5-0.5 % EX LOTN
TOPICAL_LOTION | CUTANEOUS | Status: DC | PRN
  Filled 2024-01-29: qty 222

## 2024-01-29 NOTE — Progress Notes (Addendum)
 Physical Therapy Session Note  Patient Details  Name: Megan Fischer MRN: [id number] Date of Birth: 01/20/1951  Today's Date: 01/29/2024 PT Individual Time: 1115-1200, 1500-1526 PT Individual Time Calculation (min): 45 min, 26 min and Today's Date: 01/29/2024 PT Missed Time: 15 Minutes Missed Time Reason: Other (Comment) (eating lunch)  Short Term Goals: Week 1:  PT Short Term Goal 1 (Week 1): Pt will complete bed mobility with CGA PT Short Term Goal 2 (Week 1): Pt will complete bed<>chair transfers with minA and LRAD PT Short Term Goal 3 (Week 1): Pt will ambulate 47ft with minA and LRAD PT Short Term Goal 4 (Week 1): Pt will complete functional outcome measure to assess falls risk  Skilled Therapeutic Interventions/Progress Updates:      Treatment Session 1 Pt in bed eating lunch upon arrival. Pt missed 15 minutes 2/2 eating lunch. Pt agreeable to therapy. Pt denies any pain but endorses R LE itching. Pt requesting medication. Notified nurse. Nurse present to administer during session.   Supine to sit with use of bed features and min A for management of L LE, sit to supine with mod A for management of B LE--with pt utilizing bed features.   Donned shoes while seated EOB with total A.   Of note: pt has increased knee medial joint laxity, with compensatory L LE hip adduction and genu valgus. Followed up with pt regarding this. Pt endorses history of B knee replacement, and 12 knee surgeries with plan to do revision of L knee in November.   Treatment Session focused on NMR L LE with emphasis on correction of compensatory  L hip adduciton/genu valgus, and strongly favoring R LE and B UE support.   Pt performed mass practice of sit to stand from pedastal of steady with pt utilizing B UE progressing to unilateral UE support on steady with verbal and tactile cues for neutral hip and knee positioning, and weight shift to L LE.   Pt performed stand pivot transfer with max A with pt guarding L  LE to facilitate improved weight shift and hip and knee positioning.   Pt supine in bed with all needs within reach and bed alarm on.   Treatment Session 2  Pt supine in bed upon arrival. Pt agreeable to therapy. Pt endorses L LE soreness from earlier sesson. Therapist provded rest breaks and repositioning.   Pt perfomred the following therex for B LE strengthing/coordination:   1x10 quad sets L LE  2x10 B LE knee fall outs -active assisted verbal cues provided for slow and controlled movement  2x5 glute bridges with therapst assisting with L LE positioning and ankle stability, and to keep B LE neutral versus adducted.   Pt supine in bed with all needs within reach and bed alarm on.   Therapy Documentation Precautions:  Precautions Precautions: Fall Recall of Precautions/Restrictions: Impaired Precaution/Restrictions Comments: L homonymous hemianopsia, L hemi, B TKA's pending revision per family Restrictions Weight Bearing Restrictions Per Provider Order: No  Therapy/Group: Individual Therapy  [facility] [place], [employer], DPT  01/29/2024, 11:03 AM

## 2024-01-29 NOTE — Progress Notes (Signed)
 Occupational Therapy Session Note  Patient Details  Name: Megan Fischer MRN: [id number] Date of Birth: Apr 02, 1951  Today's Date: 01/29/2024 OT Individual Time: 9168-9085      Short Term Goals: Week 1:  OT Short Term Goal 1 (Week 1): Patient to perform upper body dressing with Min assist OT Short Term Goal 2 (Week 1): Patient to perform toilet transfer with Mod assist of one OT Short Term Goal 3 (Week 1): Patient to perform shower seated with Mod assist and AE as needed  Skilled Therapeutic Interventions/Progress Updates: Patient received resting in bed. Patient's son present. Patient agreeable to OT treatment working on self care skills. Patient able to roll to sit EOB with only min assist. Sat EOB for bathing tasks- Needed Mod assist with UB and Max/Total assist with LB bathing. Patient needed Max assist with donning hospital shirt. Max cues to dress the LUE first. Had increased difficulty with the hospital shirt due to non-stretching material. Total assist with LE dressing. Stood with Max of one to manage clothing down and Max for second stand to pull up clean brief and pants. Patient not safe leaning forward to place LLE into the pant leg due to bed height. Continue working on hemi dressing technique. Transferred to w/c for grooming tasks at the sink; set up for brushing teeth. Patient transferred back to bed at her request. Continue with skilled OT POC to improve self care performance and transfer safety.     Therapy Documentation Precautions:  Precautions Precautions: Fall Recall of Precautions/Restrictions: Impaired Precaution/Restrictions Comments: L homonymous hemianopsia, L hemi, B TKA's pending revision per family Restrictions Weight Bearing Restrictions Per Provider Order: No General:   Vital Signs: Therapy Vitals Pulse Rate: 87 BP: 107/67 Pain: Pain Assessment Pain Scale: 0-10 Pain Score: 0-No pain ADL: ADL Eating: Set up Where Assessed-Eating: Bed level Grooming:  Setup Where Assessed-Grooming: Sitting at sink Upper Body Bathing: Moderate assistance Where Assessed-Upper Body Bathing: Edge of bed Lower Body Bathing: Maximal assistance Where Assessed-Lower Body Bathing: Edge of bed Upper Body Dressing: Maximal assistance Where Assessed-Upper Body Dressing: Edge of bed Lower Body Dressing: Dependent Where Assessed-Lower Body Dressing: Edge of bed Toileting: Dependent Where Assessed-Toileting: Teacher, adult education: Dependent Statistician Method: Ambulance person: Extra wide drop arm bedside commode, Grab bars Tub/Shower Transfer: Unable to assess Tub/Shower Transfer Method: Unable to assess Film/video editor: Unable to assess [employer] Method: Unable to assess ADL Comments: Patient needing increased cues to stay on task and due to L field cut. Required some hand over hand and assist of 2 for safe ADL performance    Therapy/Group: Individual Therapy  [name] 01/29/2024, 12:23 PM

## 2024-01-29 NOTE — Progress Notes (Addendum)
 PROGRESS NOTE   Subjective/Complaints: No new medical complaints this AM.  Pain under control.   ROS: Patient denies fever, new vision changes, dizziness, nausea, vomiting, diarrhea,  shortness of breath or chest pain, headache, or mood change.     Objective:   No results found.  Recent Labs    01/28/24 0525  WBC 4.7  HGB 12.3  HCT 38.0  PLT 200   Recent Labs    01/28/24 0525 01/29/24 0436  NA 143 143  K 4.3 4.1  CL 111 108  CO2 20* 21*  GLUCOSE 148* 125*  BUN 34* 35*  CREATININE 1.67* 1.48*  CALCIUM  8.9 9.1    Intake/Output Summary (Last 24 hours) at 01/29/2024 0955 Last data filed at 01/29/2024 0800 Gross per 24 hour  Intake 818 ml  Output --  Net 818 ml        Physical Exam: Vital Signs Blood pressure 107/67, pulse 87, temperature 97.9 F (36.6 C), resp. rate 17, height 5' 3 (1.6 m), weight 81.2 kg, SpO2 95%.  Constitutional: No distress . Vital signs reviewed. Laying in bed, just finished working with therapy HEENT: NCAT, EOMI, oral membranes moist Neck: supple Cardiovascular: RRR without murmur. No JVD    Respiratory/Chest: CTA Bilaterally without wheezes or rales. Non labored GI/Abdomen: BS +, non-tender, non-distended Ext: no clubbing, cyanosis, tr to 1+ BLE edema Psych: pleasant and cooperative  Skin: Clean and intact without signs of breakdown Neuro: Alert and oriented x 4, follows simple commands, able to name and repeat, left homonymous hemianopsia, right gaze preference able to cross midline.  Left facial weakness.  Right upper and lower extremity strength appear to be intact. Left upper 4-5 and lower extremity 3-4 out of 5 more proximal and distal--similar motor exam today.   Prior neuro assessment is c/w today's exam 01/29/2024.   MSK: healed TKA incisions, no joint abnormality noted    Assessment/Plan: 1. Functional deficits which require 3+ hours per day of interdisciplinary  therapy in a comprehensive inpatient rehab setting. Physiatrist is providing close team supervision and 24 hour management of active medical problems listed below. Physiatrist and rehab team continue to assess barriers to discharge/monitor patient progress toward functional and medical goals  Care Tool:  Bathing    Body parts bathed by patient: Left arm, Chest, Front perineal area, Right upper leg, Left upper leg, Face   Body parts bathed by helper: Abdomen, Right arm, Buttocks, Right lower leg, Left lower leg     Bathing assist Assist Level: Maximal Assistance - Patient 24 - 49%     Upper Body Dressing/Undressing Upper body dressing   What is the patient wearing?: Hospital gown only    Upper body assist Assist Level: Maximal Assistance - Patient 25 - 49%    Lower Body Dressing/Undressing Lower body dressing      What is the patient wearing?: Pants, Incontinence brief     Lower body assist Assist for lower body dressing: Dependent - Patient 0%     Toileting Toileting    Toileting assist Assist for toileting: Dependent - Patient 0%     Transfers Chair/bed transfer  Transfers assist     Chair/bed transfer assist  level: Moderate Assistance - Patient 50 - 74%     Locomotion Ambulation   Ambulation assist      Assist level: 2 helpers (modA with w/c follow for safety) Assistive device: Other (comment) (R hand rail) Max distance: 30'   Walk 10 feet activity   Assist     Assist level: 2 helpers (modA with w/c follow for safety) Assistive device: Other (comment) (R hand rail)   Walk 50 feet activity   Assist Walk 50 feet with 2 turns activity did not occur: Safety/medical concerns         Walk 150 feet activity   Assist Walk 150 feet activity did not occur: Safety/medical concerns         Walk 10 feet on uneven surface  activity   Assist Walk 10 feet on uneven surfaces activity did not occur: Safety/medical concerns          Wheelchair     Assist Is the patient using a wheelchair?: Yes Type of Wheelchair: Manual    Wheelchair assist level: Moderate Assistance - Patient 50 - 74% Max wheelchair distance: 39'    Wheelchair 50 feet with 2 turns activity    Assist        Assist Level: Moderate Assistance - Patient 50 - 74%   Wheelchair 150 feet activity     Assist      Assist Level: Total Assistance - Patient < 25%   Blood pressure 107/67, pulse 87, temperature 97.9 F (36.6 C), resp. rate 17, height 5' 3 (1.6 m), weight 81.2 kg, SpO2 95%.  Medical Problem List and Plan: 1. 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  Antithrombotics: -DVT/anticoagulation:  Pharmaceutical: Eliquis              -antiplatelet therapy: ECASA 3. Pain Management: tylenol  prn. Voltaren  gel qid to knee.  4. Mood/Behavior/Sleep: LCSW to follow for evaluation and support.             --Melatonin prn.              -antipsychotic agents: N/A 5. Neuropsych/cognition: This patient is capable of making decisions on her own behalf. 6. Skin/Wound Care: Routine pressure relief measures.  7. Fluids/Electrolytes/Nutrition: Monitor I/O. Check CMET in am.  8. A fib: Monitor HR TID--on metoprolol  --Eliquis  resumed 09/18 9. HTN: Monitor BP TID--on amlodipine , Lotensin  and Metoprolol .  Avoid hypotension and with long-term goal normotensive             -Fair control continue to monitor, internal medicine suggests outpatient on thiazide with benazepril  combo pill to reduce pill burden  -9/20 BP mildly elevated, continue current regimen for now would like to avoid  causing hypotension  -9/23 BP controlled, continue current regimen     01/29/2024    7:28 AM 01/29/2024    6:00 AM 01/29/2024    4:42 AM  Vitals with BMI  Weight  179 lbs   BMI  31.72   Systolic  107  127  Diastolic 67  75  Pulse 87  81      10. CKD: BUN/SCr 30/1.40 @ admission.  --Baseline likely 1.3-1.5 in the last few months.              --Recheck in am.              - 9/19 reorder CMP, initial lab draw hemolyzed  -9/22 BUN/Cr   elevated beyond baseline today but within her variance going back to June labs.  Jardiance  resumed 9/18 which should have helped. She is on lotensin , however. Consider holding if further trend up   -for now encourage fluids   -recheck BMET in AM  -9/23 Cr down to 1.48, around her baseline, BUN a little higher at 35, continue to encourage oral fluids 11. T2DM: Hgb A1c- 6.5 and well controlled. Was on Semaglutide  1 mg, Amaryl  1 mg and Jardiance  25 mg PTA             --Jardiance  resumed on 09/18 --Monitor BS ac/hs and use SSI for elevated BS -9/19-22  fair control continue to monitor trend  9/23 controlled, discussed with pharmacy will DC sliding scale and change CBGs to twice a day CBG (last 3)  Recent Labs    01/28/24 1641 01/28/24 2051 01/29/24 0640  GLUCAP 195* 144* 131*      12. 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  13. Scoliosis/Spondylolisthesis: Has hx of chronic LBP w/BLE weakness --also endstage OA right knee--Diclofenac  gel qid 14. Hyperthyroid: Managed with Tapazole  5 mg daily.  15. Hyperlipidemia: LDL 123 w/goal < 70.  --On Crestor  20 mg (was increased) and Zetia .  16. Constipation             -Start miralax   -9/22 LBM, improved        LOS: 5 days A FACE TO FACE EVALUATION WAS PERFORMED  Murray Collier 01/29/2024, 9:55 AM

## 2024-01-29 NOTE — Progress Notes (Signed)
 Speech Language Pathology Daily Session Note  Patient Details  Name: Megan Fischer MRN: [id number] Date of Birth: Dec 20, 1950  Today's Date: 01/29/2024 SLP Individual Time: 1000-1100 SLP Individual Time Calculation (min): 60 min  Short Term Goals: Week 1: SLP Short Term Goal 1 (Week 1): Patient will attend to the L during functional therapy tasks to identify L presenting task components in 4/5 opportunities given min assist. SLP Short Term Goal 2 (Week 1): Patient will recall and manipulate functional information during problem solving tasks in 80% of trials given min assist. SLP Short Term Goal 3 (Week 1): Patient will solve mildly complex functional problems with 80% accuracy given overall min assist.  Skilled Therapeutic Interventions:   SLP conducted skilled therapy session targeting cognition goals. SLP facilitated 3 mildly complex tasks to target L neglect, sustained attention, working memory, and problem solving. Throughout activities, patient required min to mod cues to attend to L visual field. Patient required min to mod cues for sustained attention as she frequently distracted herself and diverted from tasks. During maze and functional monetary tasks, patient expressed wanting to discontinue activity as complexity increased but redirected easily. Patient required min to mod cues for problem solving and increased processing time to enhance accuracy on calculations. She utilized visual aids such as handwriting calculations with errors resulting from both visual deficits and working memory impairments. Patient was left in room with call bell in reach and alarm set. SLP will continue to target goals per plan of care.     Pain Pain Assessment Pain Scale: 0-10 Pain Score: 0-No pain  Therapy/Group: Individual Therapy  Ashley A Ellin 01/29/2024, 11:07 AM

## 2024-01-30 DIAGNOSIS — K5900 Constipation, unspecified: Secondary | ICD-10-CM | POA: Diagnosis not present

## 2024-01-30 DIAGNOSIS — E1169 Type 2 diabetes mellitus with other specified complication: Secondary | ICD-10-CM | POA: Diagnosis not present

## 2024-01-30 DIAGNOSIS — I63531 Cerebral infarction due to unspecified occlusion or stenosis of right posterior cerebral artery: Secondary | ICD-10-CM | POA: Diagnosis not present

## 2024-01-30 DIAGNOSIS — I10 Essential (primary) hypertension: Secondary | ICD-10-CM | POA: Diagnosis not present

## 2024-01-30 LAB — GLUCOSE, CAPILLARY: Glucose-Capillary: 121 mg/dL — ABNORMAL HIGH (ref 70–99)

## 2024-01-30 MED ORDER — SENNOSIDES-DOCUSATE SODIUM 8.6-50 MG PO TABS
1.0000 | ORAL_TABLET | Freq: Every day | ORAL | Status: DC
  Administered 2024-01-30 – 2024-01-31 (×2): 1 via ORAL
  Filled 2024-01-30 (×2): qty 1

## 2024-01-30 NOTE — Progress Notes (Signed)
 Physical Therapy Session Note  Patient Details  Name: Megan Fischer MRN: [id number] Date of Birth: 07-01-1950  Today's Date: 01/30/2024 PT Individual Time: [phone], [phone] PT Individual Time Calculation (min): 58 min, 44 min  Short Term Goals: Week 1:  PT Short Term Goal 1 (Week 1): Pt will complete bed mobility with CGA PT Short Term Goal 2 (Week 1): Pt will complete bed<>chair transfers with minA and LRAD PT Short Term Goal 3 (Week 1): Pt will ambulate 25ft with minA and LRAD PT Short Term Goal 4 (Week 1): Pt will complete functional outcome measure to assess falls risk  Skilled Therapeutic Interventions/Progress Updates:      Treatment Session 1  Pt supine in bed upon arrival. Pt agreeable to therapy. Pt denies any pain.   Bed mobility: supine to sit with CGA and increased time with use of bed rails and HOB elevated to 60 degrees.   Squat pivot transfer elevated bed to WC to R with heavy mod-max A, verbal cues provided for technique/sequencing.   Stand pivot transfer WC to BSC over toilet with mod A with use of B UE on grab bar--PT providing max tactile cues for correction of L LE hip adduction/genu valgus.   Pt continent of bowel/bladder.   Utilized steady to transfer from toilet and perform pericare with total A.   Pt ambulated 20 feet with R UE support on handrail and L HHA with +2 for WC follow, verbal/tactile cues provided for L LE positioning, and postural control.   Pt performed R LE marching with L HHA and R UE support on handrail, with therapist stabilizing L LE and facilitating increased lateral weight shift to L.   Pt seated in WC at nursing station at end of session for pt safety to ensure pt not sliding out of chair and     Treatment Session 2  Pt supine in bed upon arrival. Pt agreeable to therapy. Pt denies any pain.   Followed up with pt post conference regarding PWC as both pt and family have expressed interest in PWC. Attempted to contact pt son,  however unable to reach him at this time.   Discussed pt goals, therapist goals, POC, follow up therapy.   Pt endorses numerous falls (unable to quantify at this time) in the last 6 months(prior to stroke) while in house and in community while walking with rollator 2/2 B LE buckling. Pt confirms she lives in a first floor apartment with level entry. Pt provided home measurement sheet and encouraged pt to have family fill this out to ensure pt apartment is accessible with PWC. Pt confirms she utilized [facility] prior to hospital stay for transportation to/from doctor appointments.   Plan to trail PWC next session to determine if pt safe to utilize PWC 2/2 cognitive deficits and inattention.   Pt requesting to use bathroom. Pt utilized steady and min A with verbal and tactile cues provided for hip abduction/ER and to prevent buckling. Pt continent of bladder.   Sit to supine with mod A for management of B LE. Pt scooted up in bed with increased time and A for management of L LE.   Pt supine in bed with all needs within reach and bed alarm on.         Therapy Documentation Precautions:  Precautions Precautions: Fall Recall of Precautions/Restrictions: Impaired Precaution/Restrictions Comments: L homonymous hemianopsia, L hemi, B TKA's pending revision per family Restrictions Weight Bearing Restrictions Per Provider Order: No  Therapy/Group: Individual Therapy  [facility] Metcalf, [place], DPT  01/30/2024, 8:17 AM

## 2024-01-30 NOTE — Progress Notes (Addendum)
 Occupational Therapy Session Note  Patient Details  Name: Megan Fischer MRN: [id number] Date of Birth: 1951-02-08  Today's Date: 01/30/2024 OT Individual Time: 1005-1100 OT Individual Time Calculation (min): 55 min    Short Term Goals: Week 1:  OT Short Term Goal 1 (Week 1): Patient to perform upper body dressing with Min assist OT Short Term Goal 2 (Week 1): Patient to perform toilet transfer with Mod assist of one OT Short Term Goal 3 (Week 1): Patient to perform shower seated with Mod assist and AE as needed  Skilled Therapeutic Interventions/Progress Updates:  Pt greeted sitting in [facility], family member present but leaving shortly after start of ADL session. Stedy utilized for safety of transfers and overall energy conservation. Pt requires CGA-Min A for use of transfer equipment, maintaining static stance with supervision. BSC placed in shower for improved posterior reach. Pt bathes with overall supervision, intermediate verbal cuing for attention to L-side of body. Pt dresses UB with Min-Mod A due to nature of paper scrub shirt and general fatigue. Pt requires Max A for LB dressing due to time constraints. Re-educated on hemi-dressing techniques. Pt reports 9/10 headache pain, post-shower, vitals assessed at BP=117/21 & HR=79. LPN made aware. Pt remained sitting in WC with posey belt activated, needs within reach.   Therapy Documentation Precautions:  Precautions Precautions: Fall Recall of Precautions/Restrictions: Impaired Precaution/Restrictions Comments: L homonymous hemianopsia, L hemi, B TKA's pending revision per family Restrictions Weight Bearing Restrictions Per Provider Order: No   Therapy/Group: Individual Therapy  Nereida Habermann, OTR/L, MSOT  01/30/2024, 7:56 AM

## 2024-01-30 NOTE — Patient Care Conference (Signed)
 Inpatient RehabilitationTeam Conference and Plan of Care Update Date: 01/30/2024   Time: 12:13 PM    Patient Name: Megan Fischer      Medical Record Number: [id number]  Date of Birth: 04-10-1951 Sex: Female         Room/Bed: 4W08C/4W08C-01 Payor Info: Payor: Advertising copywriter MEDICARE / Plan: DREMA DUAL COMPLETE / Product Type: *No Product type* /    Admit Date/Time:  01/24/2024  5:26 PM  Primary Diagnosis:  Acute ischemic right PCA stroke [facility])  Hospital Problems: Principal Problem:   Acute ischemic right PCA stroke [facility])    Expected Discharge Date: Expected Discharge Date: 02/13/24  Team Members Present: Physician leading conference: Dr. Murray Collier Social Worker Present: Graeme Jude, LCSW Nurse Present: Barnie Ronde, RN PT Present: Doreene Orris, PT OT Present: Delon Sharps, OT SLP Present: [name], SLP PPS Coordinator present : Eleanor Colon, SLP     Current Status/Progress Goal Weekly Team Focus  Bowel/Bladder   Continent of bowel/bladder. LBM 9/22   To remain continent of bowe/bladder with Mod I   Assess bowel/bladder function q shift and as needed    Swallow/Nutrition/ Hydration               ADL's   Patient currently needs Max assist for self care, but shows better awareness of the left side and improving dynamic sitting balance.   Patient continues to work towards LTG of functional transfers at a Min assist level   Focus on dressing following hemi techinique and safe Mod of one squat pivot transfer.    Mobility   bed mobility: supine to sit with CGA with use of bed rails and HOB elevated to 60 deg, sit to stand with heavy mod A , stand pivot transfer with mod-max A, gait x30 feet with HHA and use of handrail 2/2 L hemi paresis, L inattention, laxitiy of L knee (history of 12 surgeries on L knee and pt scheduled for revision in november) and R UE arthritis   D/C 10/8, HHPT, goals: CGA/min A, need to schedule family training. DME  TBD       Communication                Safety/Cognition/ Behavioral Observations  min to mod assist for L inattention, sustained attention, min assist for working memory, very easily distractable   supervision   L inattention, sustained attention, working memory, redirection to task    Pain   No complain of pain   To ramain pain free while in Rehab   Assess and treat pain q shift and as needed    Skin   No skin issue   skin to be free of infection/breakdown while in Rehab  Assess skin q shift and as needed      Discharge Planning:  Plans to discharge home with daughter, Mylinda providing assistance as needed (limited due to fall) - Patients son, Mohammed lives with her and able to privide care when there. Family interested in [facility]. Awaiting therapy follow-up recommendations.   Team Discussion: Patient admitted post right PCA CVA with inattention and fatigue.  Progress limited by arthritis of right UE and hemiparesis on left side post stroke and knee limitations; surgery pending. Needs encouragement to participate in therapy, cues for safety and mod assist for attention as she is easily distracted.  Patient on target to meet rehab goals: yes, currently using a stedy for transfers. Needs max assist for sit - stand and mod - max for stand pivot  transfers.  Needs mod - total assist for ADLs. Able to ambulate with HHA and use of right rail.  Goals for discharge set for CGA - min assist overall.   *See Care Plan and progress notes for long and short-term goals.   Revisions to Treatment Plan:  N/a   Teaching Needs: Safety, medications, transfers, toileting, etc.   Current Barriers to Discharge: Decreased caregiver support  Possible Resolutions to Barriers: Family education DME: power chair, stedy     Medical Summary Current Status: CVA, DM2, constipation, CKD, THN, Afib  Barriers to Discharge: Medical stability;Self-care education;Behavior/Mood  Barriers to Discharge  Comments: CVA, DM2, constipation, CKD, THN, Afib Possible Resolutions to [employer] Focus: CBGs daily, monitor bowel function, recheck BMP tomorrow, monitor BP, AFIB   Continued Need for Acute Rehabilitation Level of Care: The patient requires daily medical management by a physician with specialized training in physical medicine and rehabilitation for the following reasons: Direction of a multidisciplinary physical rehabilitation program to maximize functional independence : Yes Medical management of patient stability for increased activity during participation in an intensive rehabilitation regime.: Yes Analysis of laboratory values and/or radiology reports with any subsequent need for medication adjustment and/or medical intervention. : Yes   I attest that I was present, lead the team conference, and concur with the assessment and plan of the team.   [name] 01/30/2024, 2:05 PM

## 2024-01-30 NOTE — Progress Notes (Signed)
 Speech Language Pathology Daily Session Note  Patient Details  Name: Megan Fischer MRN: [id number] Date of Birth: 03/02/1951  Today's Date: 01/30/2024 SLP Individual Time: 1330-1415 SLP Individual Time Calculation (min): 45 min  Short Term Goals: Week 1: SLP Short Term Goal 1 (Week 1): Patient will attend to the L during functional therapy tasks to identify L presenting task components in 4/5 opportunities given min assist. SLP Short Term Goal 2 (Week 1): Patient will recall and manipulate functional information during problem solving tasks in 80% of trials given min assist. SLP Short Term Goal 3 (Week 1): Patient will solve mildly complex functional problems with 80% accuracy given overall min assist.  Skilled Therapeutic Interventions:   SLP conducted skilled therapy session targeting cognition goals. SLP facilitated functional monetary task to target L neglect, problem solving, working memory, and sustained attention. Patient required min to mod cues to attend to L visual field when writing answers. Patient required increased processing time to visualize bills/coins and calculate total sum for 6/6 trials. Patient min to modA for problem solving and working memory. Errors were often result of visual impairments and L inattention. Patient mod to totalA for visual support and benefited from visual aids such as SLP pointing to and crossing out bills/coins. Patient required mod cues and frequent redirection to sustain attention. Patient was left in room with call bell in reach and alarm set. SLP will continue to target goals per plan of care.    Pain   None  Therapy/Group: Individual Therapy  Ashley A Ellin 01/30/2024, 2:27 PM

## 2024-01-30 NOTE — Progress Notes (Signed)
 Patient ID: Megan Fischer, female   DOB: 01-01-1951, 73 y.o.   MRN: [id number]  This SW covering for primary SW, Di'Asia Loreli.  1451- SW left message for pt dtr Carl [phone]) to request return phone call to provide updates from team conference, and scheduled fam edu. SW requested follow-up for overall understanding of pt care needs as well.   Graeme Jude, MSW, LCSW Office: [phone] Cell: [phone] Fax: [phone]

## 2024-01-30 NOTE — Progress Notes (Addendum)
 PROGRESS NOTE   Subjective/Complaints: No new complaints this AM. Son would like to talk with social work- passed this along.   ROS: Patient denies chills, new vision changes, dizziness, nausea, vomiting, diarrhea,  shortness of breath or chest pain, headache, or mood change. Denies joint pain      Objective:   No results found.  Recent Labs    01/28/24 0525  WBC 4.7  HGB 12.3  HCT 38.0  PLT 200   Recent Labs    01/28/24 0525 01/29/24 0436  NA 143 143  K 4.3 4.1  CL 111 108  CO2 20* 21*  GLUCOSE 148* 125*  BUN 34* 35*  CREATININE 1.67* 1.48*  CALCIUM  8.9 9.1    Intake/Output Summary (Last 24 hours) at 01/30/2024 1000 Last data filed at 01/30/2024 0800 Gross per 24 hour  Intake 478 ml  Output 800 ml  Net -322 ml        Physical Exam: Vital Signs Blood pressure (!) 149/71, pulse 83, temperature 98 F (36.7 C), temperature source Oral, resp. rate 18, height 5' 3 (1.6 m), weight 81.2 kg, SpO2 98%.  Constitutional: No distress . Vital signs reviewed. Sitting in WC, going to bathroom with nursing  HEENT: NCAT, EOMI, oral membranes moist Neck: supple Cardiovascular: RRR without murmur. No JVD    Respiratory/Chest: CTA Bilaterally without wheezes or rales. Non labored GI/Abdomen: BS +, non-tender, non-distended Ext: no clubbing, cyanosis, tr to 1+ BLE edema Psych: pleasant and cooperative  Skin: Clean and intact without signs of breakdown on visible portion  Neuro: Alert and oriented x 4, follows simple commands, able to name and repeat, left homonymous hemianopsia, right gaze preference able to cross midline.  Left facial weakness.  Right upper and lower extremity strength appear to be intact. Left upper 4-5 and lower extremity 3-4 out of 5 more proximal and distal--similar motor exam today.   Prior neuro assessment is c/w today's exam 01/30/2024.   MSK: healed TKA incisions, no joint abnormality  noted    Assessment/Plan: 1. Functional deficits which require 3+ hours per day of interdisciplinary therapy in a comprehensive inpatient rehab setting. Physiatrist is providing close team supervision and 24 hour management of active medical problems listed below. Physiatrist and rehab team continue to assess barriers to discharge/monitor patient progress toward functional and medical goals  Care Tool:  Bathing    Body parts bathed by patient: Left arm, Chest, Front perineal area, Right upper leg, Left upper leg, Face   Body parts bathed by helper: Abdomen, Right arm, Buttocks, Right lower leg, Left lower leg     Bathing assist Assist Level: Maximal Assistance - Patient 24 - 49%     Upper Body Dressing/Undressing Upper body dressing   What is the patient wearing?: Hospital gown only    Upper body assist Assist Level: Maximal Assistance - Patient 25 - 49%    Lower Body Dressing/Undressing Lower body dressing      What is the patient wearing?: Pants, Incontinence brief     Lower body assist Assist for lower body dressing: Dependent - Patient 0%     Toileting Toileting    Toileting assist Assist for toileting: Dependent -  Patient 0%     Transfers Chair/bed transfer  Transfers assist     Chair/bed transfer assist level: Moderate Assistance - Patient 50 - 74%     Locomotion Ambulation   Ambulation assist      Assist level: 2 helpers (modA with w/c follow for safety) Assistive device: Other (comment) (R hand rail) Max distance: 30'   Walk 10 feet activity   Assist     Assist level: 2 helpers (modA with w/c follow for safety) Assistive device: Other (comment) (R hand rail)   Walk 50 feet activity   Assist Walk 50 feet with 2 turns activity did not occur: Safety/medical concerns         Walk 150 feet activity   Assist Walk 150 feet activity did not occur: Safety/medical concerns         Walk 10 feet on uneven surface   activity   Assist Walk 10 feet on uneven surfaces activity did not occur: Safety/medical concerns         Wheelchair     Assist Is the patient using a wheelchair?: Yes Type of Wheelchair: Manual    Wheelchair assist level: Moderate Assistance - Patient 50 - 74% Max wheelchair distance: 58'    Wheelchair 50 feet with 2 turns activity    Assist        Assist Level: Moderate Assistance - Patient 50 - 74%   Wheelchair 150 feet activity     Assist      Assist Level: Total Assistance - Patient < 25%   Blood pressure (!) 149/71, pulse 83, temperature 98 F (36.7 C), temperature source Oral, resp. rate 18, height 5' 3 (1.6 m), weight 81.2 kg, SpO2 98%.  Medical Problem List and Plan: 1. 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 Formulized individual treatment plan based on medical history, underlying problem and comorbidities.   2.  Antithrombotics: -DVT/anticoagulation:  Pharmaceutical: Eliquis              -antiplatelet therapy: ECASA 3. Pain Management: tylenol  prn. Voltaren  gel qid to knee.  4. Mood/Behavior/Sleep: LCSW to follow for evaluation and support.             --Melatonin prn.              -antipsychotic agents: N/A 5. Neuropsych/cognition: This patient is capable of making decisions on her own behalf. 6. Skin/Wound Care: Routine pressure relief measures.  7. Fluids/Electrolytes/Nutrition: Monitor I/O. Check CMET in am.  8. A fib: Monitor HR TID--on  metoprolol  --Eliquis  resumed 09/18 9. HTN: Monitor BP TID--on amlodipine , Lotensin  and Metoprolol .  Avoid hypotension and with long-term goal normotensive             -Fair control continue to monitor, internal medicine suggests outpatient on thiazide with benazepril  combo pill to reduce pill burden  -9/20 BP mildly elevated,  continue current regimen for now would like to avoid causing hypotension  -9/23-24 BP controlled, continue current regimen     01/30/2024    3:47 AM 01/29/2024    7:59 PM 01/29/2024    2:00 PM  Vitals with BMI  Systolic 149 137 876  Diastolic 71 64 72  Pulse 83 77 80      10. CKD: BUN/SCr 30/1.40 @ admission.  --Baseline likely 1.3-1.5 in the last few months.              --Recheck in am.              - 9/19 reorder CMP, initial lab draw hemolyzed  -9/22 BUN/Cr   elevated beyond baseline today but within her variance going back to June labs.  Jardiance  resumed 9/18 which should have helped. She is on lotensin , however. Consider holding if further trend up   -for now encourage fluids   -recheck BMET in AM  -9/23 Cr down to 1.48, around her baseline, BUN a little higher at 35, continue to encourage oral fluids  -9/24 Recheck tomorrow  11. T2DM: Hgb A1c- 6.5 and well controlled. Was on Semaglutide  1 mg, Amaryl  1 mg and Jardiance  25 mg PTA             --Jardiance  resumed on 09/18 --Monitor BS ac/hs and use SSI for elevated BS -9/19-22  fair control continue to monitor trend  9/23 controlled, discussed with pharmacy will DC sliding scale and change CBGs to twice a day -9/24 controlled overall, decrease CBG to daily CBG (last 3)  Recent Labs    01/29/24 1216 01/29/24 1640 01/30/24 0628  GLUCAP 175* 147* 121*      12. 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  13. Scoliosis/Spondylolisthesis: Has hx of chronic LBP w/BLE weakness --also endstage OA right knee--Diclofenac  gel qid 14.  Hyperthyroid: Managed with Tapazole  5 mg daily.  15. Hyperlipidemia: LDL 123 w/goal < 70.  --On Crestor  20 mg (was increased) and Zetia .  16. Constipation             -Start miralax   -9/22 LBM, add senokot           LOS: 6 days A FACE TO FACE EVALUATION WAS PERFORMED  Murray Collier 01/30/2024, 10:00 AM

## 2024-01-31 DIAGNOSIS — N189 Chronic kidney disease, unspecified: Secondary | ICD-10-CM

## 2024-01-31 DIAGNOSIS — I63531 Cerebral infarction due to unspecified occlusion or stenosis of right posterior cerebral artery: Secondary | ICD-10-CM | POA: Diagnosis not present

## 2024-01-31 DIAGNOSIS — I10 Essential (primary) hypertension: Secondary | ICD-10-CM | POA: Diagnosis not present

## 2024-01-31 DIAGNOSIS — E1169 Type 2 diabetes mellitus with other specified complication: Secondary | ICD-10-CM | POA: Diagnosis not present

## 2024-01-31 DIAGNOSIS — N183 Chronic kidney disease, stage 3 unspecified: Secondary | ICD-10-CM

## 2024-01-31 DIAGNOSIS — K5900 Constipation, unspecified: Secondary | ICD-10-CM | POA: Diagnosis not present

## 2024-01-31 LAB — BASIC METABOLIC PANEL WITH GFR
Anion gap: 10 (ref 5–15)
BUN: 33 mg/dL — ABNORMAL HIGH (ref 8–23)
CO2: 23 mmol/L (ref 22–32)
Calcium: 9.1 mg/dL (ref 8.9–10.3)
Chloride: 108 mmol/L (ref 98–111)
Creatinine, Ser: 1.61 mg/dL — ABNORMAL HIGH (ref 0.44–1.00)
GFR, Estimated: 34 mL/min — ABNORMAL LOW (ref >60)
Glucose, Bld: 132 mg/dL — ABNORMAL HIGH (ref 70–99)
Potassium: 3.9 mmol/L (ref 3.5–5.1)
Sodium: 141 mmol/L (ref 135–145)

## 2024-01-31 LAB — CBC
HCT: 37 % (ref 36.0–46.0)
Hemoglobin: 12 g/dL (ref 12.0–15.0)
MCH: 29.1 pg (ref 26.0–34.0)
MCHC: 32.4 g/dL (ref 30.0–36.0)
MCV: 89.6 fL (ref 80.0–100.0)
Platelets: 194 K/uL (ref 150–400)
RBC: 4.13 MIL/uL (ref 3.87–5.11)
RDW: 14.6 % (ref 11.5–15.5)
WBC: 4.6 K/uL (ref 4.0–10.5)
nRBC: 0 % (ref 0.0–0.2)

## 2024-01-31 MED ORDER — EMPAGLIFLOZIN 25 MG PO TABS
25.0000 mg | ORAL_TABLET | Freq: Every day | ORAL | Status: DC
Start: 2024-02-04 — End: 2024-02-01

## 2024-01-31 MED ORDER — CAMPHOR-MENTHOL 0.5-0.5 % EX LOTN: TOPICAL_LOTION | CUTANEOUS | Status: DC | PRN

## 2024-01-31 MED ORDER — ACETAMINOPHEN 325 MG PO TABS: 325.0000 mg | ORAL_TABLET | ORAL | Status: AC | PRN

## 2024-01-31 NOTE — Progress Notes (Signed)
 PROGRESS NOTE   Subjective/Complaints: NO new complaints or concerns this AM. Reports pain is overall under control.   ROS: Patient denies fevers , new vision changes, dizziness, nausea, vomiting, diarrhea,  shortness of breath or chest pain, headache, or mood change.  Denies new motor or sensory changes      Objective:   No results found.  Recent Labs    01/31/24 0524  WBC 4.6  HGB 12.0  HCT 37.0  PLT 194   Recent Labs    01/29/24 0436 01/31/24 0524  NA 143 141  K 4.1 3.9  CL 108 108  CO2 21* 23  GLUCOSE 125* 132*  BUN 35* 33*  CREATININE 1.48* 1.61*  CALCIUM  9.1 9.1    Intake/Output Summary (Last 24 hours) at 01/31/2024 0932 Last data filed at 01/30/2024 1300 Gross per 24 hour  Intake 50 ml  Output --  Net 50 ml        Physical Exam: Vital Signs Blood pressure 138/71, pulse 80, temperature 98.3 F (36.8 C), temperature source Oral, resp. rate 16, height 5' 3 (1.6 m), weight 83.7 kg, SpO2 95%.  Constitutional: No distress . Vital signs reviewed. Sitting in WC HEENT: NCAT, EOMI, oral membranes moist Neck: supple Cardiovascular: RRR without murmur. No JVD    Respiratory/Chest: CTA Bilaterally without wheezes or rales. Non labored GI/Abdomen: BS +, non-tender, non-distended Ext: no clubbing, cyanosis, tr edema right lower extremity, 2+ left lower extremity -chronic for many years Psych: pleasant and cooperative  Skin: Clean and intact without signs of breakdown on visible portion  Neuro: Alert and oriented x 4, follows simple commands, able to name and repeat, left homonymous hemianopsia, right gaze preference able to cross midline.  Left facial weakness.  Right upper and lower extremity strength appear to be intact. Left upper 4-5 and lower extremity 3-4 out of 5 more proximal and distal  Prior neuro assessment is c/w today's exam 01/31/2024.   MSK: healed TKA incisions, no joint abnormality  noted    Assessment/Plan: 1. Functional deficits which require 3+ hours per day of interdisciplinary therapy in a comprehensive inpatient rehab setting. Physiatrist is providing close team supervision and 24 hour management of active medical problems listed below. Physiatrist and rehab team continue to assess barriers to discharge/monitor patient progress toward functional and medical goals  Care Tool:  Bathing    Body parts bathed by patient: Left arm, Chest, Front perineal area, Right upper leg, Left upper leg, Face, Right arm, Abdomen, Buttocks, Right lower leg, Left lower leg   Body parts bathed by helper: Abdomen, Right arm, Buttocks, Right lower leg, Left lower leg     Bathing assist Assist Level: Supervision/Verbal cueing     Upper Body Dressing/Undressing Upper body dressing   What is the patient wearing?: Pull over shirt    Upper body assist Assist Level: Moderate Assistance - Patient 50 - 74%    Lower Body Dressing/Undressing Lower body dressing      What is the patient wearing?: Pants, Incontinence brief     Lower body assist Assist for lower body dressing: Maximal Assistance - Patient 25 - 49%     Toileting Toileting  Toileting assist Assist for toileting: Dependent - Patient 0%     Transfers Chair/bed transfer  Transfers assist     Chair/bed transfer assist level: Moderate Assistance - Patient 50 - 74%     Locomotion Ambulation   Ambulation assist      Assist level: 2 helpers (modA with w/c follow for safety) Assistive device: Other (comment) (R hand rail) Max distance: 30'   Walk 10 feet activity   Assist     Assist level: 2 helpers (modA with w/c follow for safety) Assistive device: Other (comment) (R hand rail)   Walk 50 feet activity   Assist Walk 50 feet with 2 turns activity did not occur: Safety/medical concerns         Walk 150 feet activity   Assist Walk 150 feet activity did not occur: Safety/medical  concerns         Walk 10 feet on uneven surface  activity   Assist Walk 10 feet on uneven surfaces activity did not occur: Safety/medical concerns         Wheelchair     Assist Is the patient using a wheelchair?: Yes Type of Wheelchair: Manual    Wheelchair assist level: Moderate Assistance - Patient 50 - 74% Max wheelchair distance: 41'    Wheelchair 50 feet with 2 turns activity    Assist        Assist Level: Moderate Assistance - Patient 50 - 74%   Wheelchair 150 feet activity     Assist      Assist Level: Total Assistance - Patient < 25%   Blood pressure 138/71, pulse 80, temperature 98.3 F (36.8 C), temperature source Oral, resp. rate 16, height 5' 3 (1.6 m), weight 83.7 kg, SpO2 95%.  Medical Problem List and Plan: 1. 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  Antithrombotics: -DVT/anticoagulation:  Pharmaceutical: Eliquis              -antiplatelet therapy: ECASA 3. Pain Management: tylenol  prn. Voltaren  gel qid to knee.  4. Mood/Behavior/Sleep: LCSW to follow for evaluation and support.             --Melatonin prn.              -antipsychotic agents: N/A 5. Neuropsych/cognition: This patient is capable of making decisions on her own behalf. 6. Skin/Wound Care: Routine pressure relief measures.  7. Fluids/Electrolytes/Nutrition: Monitor I/O. Check CMET in am.  8. A fib: Monitor HR TID--on metoprolol  --Eliquis  resumed 09/18 9. HTN: Monitor BP TID--on amlodipine , Lotensin  and Metoprolol .  Avoid hypotension and with long-term goal normotensive              -Fair control continue to monitor, internal medicine suggests outpatient on thiazide with benazepril  combo pill to reduce pill burden  -9/20 BP mildly elevated, continue current regimen for now would like to avoid causing hypotension  -9/23-25 BP controlled, continue current regimen     01/31/2024  4:18 AM 01/30/2024    8:47 PM 01/30/2024    4:25 PM  Vitals with BMI  Weight 184 lbs 8 oz    BMI 32.7    Systolic 138 122 867  Diastolic 71 76 68  Pulse 80 69 76      10. CKD: BUN/SCr 30/1.40 @ admission.  --Baseline likely 1.3-1.5 in the last few months.              --Recheck in am.              - 9/19 reorder CMP, initial lab draw hemolyzed  -9/22 BUN/Cr   elevated beyond baseline today but within her variance going back to June labs.  Jardiance  resumed 9/18 which should have helped. She is on lotensin , however. Consider holding if further trend up   -for now encourage fluids   -recheck BMET in AM  -9/23 Cr down to 1.48, around her baseline, BUN a little higher at 35, continue to encourage oral fluids  -9/25 creatinine a little higher again today at at 1.61/BUN 33, encourage oral fluids, will hold Jardiance  until recheck tomorrow.  Not a lot of fluid intake recorded.  Discussed encouraging fluids with nursing 11. T2DM: Hgb A1c- 6.5 and well controlled. Was on Semaglutide  1 mg, Amaryl  1 mg and Jardiance  25 mg PTA             --Jardiance  resumed on 09/18 --Monitor BS ac/hs and use SSI for elevated BS -9/19-22  fair control continue to monitor trend  9/23 controlled, discussed with pharmacy will DC sliding scale and change CBGs to twice a day -9/24 controlled overall, decrease CBG to daily -9/25 controlled continue to monitor daily for now CBG (last 3)  Recent Labs    01/29/24 1216 01/29/24 1640 01/30/24 0628  GLUCAP 175* 147* 121*      12. 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  13.  Scoliosis/Spondylolisthesis: Has hx of chronic LBP w/BLE weakness --also endstage OA right knee--Diclofenac  gel qid 14. Hyperthyroid: Managed with Tapazole  5 mg daily.  15. Hyperlipidemia: LDL 123 w/goal < 70.  --On Crestor  20 mg (was increased) and Zetia .  16. Constipation             -Start miralax   -9/25 LBM yesterday, doing better continue to follow          LOS: 7 days A FACE TO FACE EVALUATION WAS PERFORMED  Murray Collier 01/31/2024, 9:32 AM

## 2024-01-31 NOTE — Discharge Instructions (Addendum)
 Inpatient Rehab Discharge Instructions  Megan Fischer Discharge date and time:  02/14/24  Activities/Precautions/ Functional Status: Activity: no lifting, driving, or strenuous exercise till cleared by MD Diet: cardiac diet and diabetic diet Wound Care: none needed   Functional status:  ___ No restrictions     ___ Walk up steps independently ___ 24/7 supervision/assistance   ___ Walk up steps with assistance ___ Intermittent supervision/assistance  ___ Bathe/dress independently ___ Walk with walker     ___ Bathe/dress with assistance ___ Walk Independently    ___ Shower independently ___ Walk with assistance    ___ Shower with assistance ___ No alcohol     ___ Return to work/school ________  Special Instructions:   STROKE/TIA DISCHARGE INSTRUCTIONS SMOKING Cigarette smoking nearly doubles your risk of having a stroke & is the single most alterable risk factor  If you smoke or have smoked in the last 12 months, you are advised to quit smoking for your health. Most of the excess cardiovascular risk related to smoking disappears within a year of stopping. Ask you doctor about anti-smoking medications [place] Quit Line: 1-800-QUIT NOW Free Smoking Cessation Classes [phone]  CHOLESTEROL Know your levels; limit fat & cholesterol in your diet  Lipid Panel     Component Value Date/Time   CHOL 232 (H) 01/22/2024 0915   CHOL 201 (H) 03/01/2023 1151   TRIG 77 01/22/2024 0915   HDL 94 01/22/2024 0915   HDL 108 03/01/2023 1151   CHOLHDL 2.5 01/22/2024 0915   VLDL 15 01/22/2024 0915   LDLCALC 123 (H) 01/22/2024 0915   LDLCALC 83 03/01/2023 1151     Many patients benefit from treatment even if their cholesterol is at goal. Goal: Total Cholesterol (CHOL) less than 160 Goal:  Triglycerides (TRIG) less than 150 Goal:  HDL greater than 40 Goal:  LDL (LDLCALC) less than 100   BLOOD PRESSURE American Stroke Association blood pressure target is less that 120/80 mm/Hg  Your discharge  blood pressure is:  BP: 138/71 Monitor your blood pressure Limit your salt and alcohol intake Many individuals will require more than one medication for high blood pressure  DIABETES (A1c is a blood sugar average for last 3 months) Goal HGBA1c is under 7% (HBGA1c is blood sugar average for last 3 months)  Diabetes:     Lab Results  Component Value Date   HGBA1C 6.5 (H) 01/22/2024    Your HGBA1c can be lowered with medications, healthy diet, and exercise. Check your blood sugar as directed by your physician Call your physician if you experience unexplained or low blood sugars.  PHYSICAL ACTIVITY/REHABILITATION Goal is 30 minutes at least 4 days per week  Activity: No driving, Therapies: see above Return to work: N/A Activity decreases your risk of heart attack and stroke and makes your heart stronger.  It helps control your weight and blood pressure; helps you relax and can improve your mood. Participate in a regular exercise program. Talk with your doctor about the best form of exercise for you (dancing, walking, swimming, cycling).  DIET/WEIGHT Goal is to maintain a healthy weight  Your discharge diet is:  Diet Order             Diet Carb Modified Fluid consistency: Thin; Room service appropriate? Yes  Diet effective now                   liquids Your height is:  Height: 5' 3 (160 cm) Your current weight is: 184 lbs  Your Body Mass Index (BMI) is:  BMI (Calculated): 32.7 Following the type of diet specifically designed for you will help prevent another stroke. Your goal weight is:  141 lbs Your goal Body Mass Index (BMI) is 19-24. Healthy food habits can help reduce 3 risk factors for stroke:  High cholesterol, hypertension, and excess weight.  RESOURCES Stroke/Support Group:  Call [phone]   STROKE EDUCATION PROVIDED/REVIEWED AND GIVEN TO PATIENT Stroke warning signs and symptoms How to activate emergency medical system (call 911). Medications prescribed at  discharge. Need for follow-up after discharge. Personal risk factors for stroke. Pneumonia vaccine given:  Flu vaccine given:  My questions have been answered, the writing is legible, and I understand these instructions.  I will adhere to these goals & educational materials that have been provided to me after my discharge from the hospital.      My questions have been answered and I understand these instructions. I will adhere to these goals and the provided educational materials after my discharge from the hospital.  Patient/Caregiver Signature _______________________________ Date __________  Clinician Signature _______________________________________ Date __________  Please bring this form and your medication list with you to all your follow-up doctor's appointments.

## 2024-01-31 NOTE — Progress Notes (Signed)
 Blood sugar per lab draw is 132.

## 2024-01-31 NOTE — Progress Notes (Signed)
 Speech Language Pathology Daily Session Note  Patient Details  Name: Megan Fischer MRN: [id number] Date of Birth: 04-Dec-1950  Today's Date: 01/31/2024 SLP Individual Time: 0922-1002 SLP Individual Time Calculation (min): 40 min  Short Term Goals: Week 1: SLP Short Term Goal 1 (Week 1): Patient will attend to the L during functional therapy tasks to identify L presenting task components in 4/5 opportunities given min assist. SLP Short Term Goal 2 (Week 1): Patient will recall and manipulate functional information during problem solving tasks in 80% of trials given min assist. SLP Short Term Goal 3 (Week 1): Patient will solve mildly complex functional problems with 80% accuracy given overall min assist.  Skilled Therapeutic Interventions: SLP conducted skilled therapy session targeting cognitive goals. SLP facilitated various basic to mildly complex tasks targeting L inattention and problem solving. Patient benefited from mod cues for maze trails, often moving through walls instead of following expected path due to visual and problem solving deficits. Patient then completed functional grocery list navigation task benefiting from supervision-min cues for working memory and mod cues for thorough scanning. Throughout, she benefited from min to mod cues for sustained attention. In final pattern replication task, patient benefited from mod to max cues to attend to L side of task and to sequence items to match visual reference. Patient was left in room with call bell in reach and alarm set. SLP will continue to target goals per plan of care.        Pain  None  Therapy/Group: Individual Therapy  Nahmir Zeidman, M.A., CCC-SLP  Thressa Shiffer A Kaeli Nichelson 01/31/2024, 10:12 AM

## 2024-01-31 NOTE — Progress Notes (Signed)
 Physical Therapy Session Note  Patient Details  Name: Megan Fischer MRN: [id number] Date of Birth: [birth year]  Today's Date: 01/31/2024 PT Individual Time: 0800-0845, 1415-1550 PT Individual Time Calculation (min): 45 min, 95 min  Short Term Goals: Week 1:  PT Short Term Goal 1 (Week 1): Pt will complete bed mobility with CGA PT Short Term Goal 2 (Week 1): Pt will complete bed<>chair transfers with minA and LRAD PT Short Term Goal 3 (Week 1): Pt will ambulate 72ft with minA and LRAD PT Short Term Goal 4 (Week 1): Pt will complete functional outcome measure to assess falls risk  Skilled Therapeutic Interventions/Progress Updates:      Treatment Session 1  Pt supine in bed upon arrival. Pt agreeable to therapy. Pt denies any pain.   Pt son present throughout session.   Discussed pt and family interest in PWC. Pt son endorses pt falling on daily basis 2/2 B LE knee weakness. Pt son endorses wanting to discontinue SCAT transport. Pt endorses he previously bought a mini fleeta that he thinks has a ramp to get inside but needs to confirm. Recommended pt son take pictures. Provided pt son with home measurement sheet to ensure home is WC accessible. PT expressed biggest safety concern with PWC navigation is pt visual and cognitive deficits -- plan to trail PWC next session for planned failure for pt and family to have better functional awareness of why pt is unsafe to use PWC. Pt son plans to be in attendance for this.   Recommended pt son attend as many sessions as possible for improved awareness of pt deficits and to get practice with pt handling. Education provided regarding scheduling process.   Pt and pt son asking questions regarding cause of pt stroke/prevention. PT introduced pt education binder, and provided education regarding risk factors for stroke, signs and symptoms with emphasis on BE FAST, prevention of recurrent stroke. PT introduced education binder.   Initiated family training  for bed mobility and bed to chair transfer with use of steady.   Supine to sit with use of bed features and close supervision.   Steady transfer bed to [facility] with min-mod A with therapist assisting with L LE positioning and facilitating hip abduction/ER. Pt son expressed interest in purchasing one of these for home. PT provided handout for purchasing knock-off version on [employer]. Education provided on limitations with knock off version.   Pt seated in WC at end of session with all needs within reach and chair alarm on.   Treatment Session 2    Pt supine in bed upon arrival. Pt agreeable to therapy. Pt denies any pain.   Pt son and DIL present during session.   Pt performed squat pivot and stand step transfer with light mod A, with increased time and verbal/tactile cues provided for weight shifting and sequencing.   Pt navigated PWC with max verbal cues for safety, and scanning to L side of environment and obstacle negotiation with mod A progressing to min A in open, controlled environment..the patient required max to total A in more distracting environments 2/2 poor carry over/difficulty sustaining attention/high distractibility/L visual field cut.   Pt son still very hopeful initially that pt would be able to continue to practice to utilize PWC. PT provided extensive education regarding justification for pt not being appropriate to utilize PWC. With further education, pt son verbalized understanding and agreeable.   Demonstrated stand step transfer. Pt requesting to get hands on practice with steady transfer and stand step  transfer. PT intiated pt son hands on training with steady transfer as pt requesting to use the bathroom.   Pt and son completed steady transfer to [facility] over toilet with intermittent A from therapist, with verbal cues provided for safety and sequencing/positioning. Pt and son demonstrate improved technique with repetition but would benefit from more practice.   Sit to supine  with min A for management of L LE.   Pt supine in bed with all needs within reach and bed alarm on.        Therapy Documentation Precautions:  Precautions Precautions: Fall Recall of Precautions/Restrictions: Impaired Precaution/Restrictions Comments: L homonymous hemianopsia, L hemi, B TKA's pending revision per family Restrictions Weight Bearing Restrictions Per Provider Order: No  Therapy/Group: Individual Therapy  [facility], [place], DPT  01/31/2024, 8:50 AM

## 2024-01-31 NOTE — Progress Notes (Signed)
 Occupational Therapy Session Note  Patient Details  Name: Megan Fischer MRN: [id number] Date of Birth: Sep 18, 1950  Today's Date: 01/31/2024 OT Individual Time: 1117-1203 OT Individual Time Calculation (min): 46 min    Short Term Goals: Week 1:  OT Short Term Goal 1 (Week 1): Patient to perform upper body dressing with Min assist OT Short Term Goal 2 (Week 1): Patient to perform toilet transfer with Mod assist of one OT Short Term Goal 3 (Week 1): Patient to perform shower seated with Mod assist and AE as needed  Skilled Therapeutic Interventions/Progress Updates: Patient seen for dynamic sitting balance and functional use of the LUE. Patient transferred from w/c to EOM- squat pivot to the left with Mod assist. Once seated safely worked on LUE targeting focusing on smooth forward reach, accurate targeting and grasp strength. Patient continues to lack normal proprioceptive input and has to maintain visual contact with LUE to maintain grasp. Patient initially needing cues for increase head turn to compensate for the L field cut and mild neglect. Continued treatment with squat pivot transfer back to w/c Mod assist on level surface. Patient taken to ADL room for tub bench education. Patient reports that the landlord will remodel bathroom from a tub to a walk in shower, but discussed the need for a seat once the remodel is done and the length of time it may take before the construction is scheduled. Patient liked the sturdy bench and agrees the transfer bench will be a safe technique at the time of discharge. Patient reporting fatigue at the end of treatment and requesting to get back to bed. Assisted Mod for squat pivot to bed from w/c. Patient able to lie down and swing legs into bed with only min assist. Continue with skilled OT POC.        Therapy Documentation Precautions:  Precautions Precautions: Fall Recall of Precautions/Restrictions: Impaired Precaution/Restrictions Comments: L homonymous  hemianopsia, L hemi, B TKA's pending revision per family Restrictions Weight Bearing Restrictions Per Provider Order: No General:   Vital Signs: Therapy Vitals Temp: 98.3 F (36.8 C) Temp Source: Oral Pulse Rate: 80 Resp: 16 BP: 138/71 Patient Position (if appropriate): Lying Oxygen Therapy SpO2: 95 % O2 Device: Room Air Pain: Patient with pain at B shoulders during over head reaching. Patient reports no change from base line OA pain.   Therapy/Group: Individual Therapy  [name] 01/31/2024, 12:46 PM

## 2024-01-31 NOTE — Progress Notes (Signed)
 This nurse asked patient if she wanted her Voltaren  gel for her right shoulder and she stated the medication does not work. She stated why take medication that doesn't work. Patient refused medication.

## 2024-02-01 DIAGNOSIS — I63531 Cerebral infarction due to unspecified occlusion or stenosis of right posterior cerebral artery: Secondary | ICD-10-CM | POA: Diagnosis not present

## 2024-02-01 DIAGNOSIS — N189 Chronic kidney disease, unspecified: Secondary | ICD-10-CM | POA: Diagnosis not present

## 2024-02-01 DIAGNOSIS — E1169 Type 2 diabetes mellitus with other specified complication: Secondary | ICD-10-CM | POA: Diagnosis not present

## 2024-02-01 DIAGNOSIS — I10 Essential (primary) hypertension: Secondary | ICD-10-CM | POA: Diagnosis not present

## 2024-02-01 DIAGNOSIS — I4891 Unspecified atrial fibrillation: Secondary | ICD-10-CM

## 2024-02-01 LAB — BASIC METABOLIC PANEL WITH GFR
Anion gap: 13 (ref 5–15)
BUN: 29 mg/dL — ABNORMAL HIGH (ref 8–23)
CO2: 20 mmol/L — ABNORMAL LOW (ref 22–32)
Calcium: 9.2 mg/dL (ref 8.9–10.3)
Chloride: 108 mmol/L (ref 98–111)
Creatinine, Ser: 1.36 mg/dL — ABNORMAL HIGH (ref 0.44–1.00)
GFR, Estimated: 41 mL/min — ABNORMAL LOW (ref >60)
Glucose, Bld: 130 mg/dL — ABNORMAL HIGH (ref 70–99)
Potassium: 4.1 mmol/L (ref 3.5–5.1)
Sodium: 141 mmol/L (ref 135–145)

## 2024-02-01 LAB — GLUCOSE, CAPILLARY: Glucose-Capillary: 119 mg/dL — ABNORMAL HIGH (ref 70–99)

## 2024-02-01 MED ORDER — EMPAGLIFLOZIN 25 MG PO TABS
25.0000 mg | ORAL_TABLET | Freq: Every day | ORAL | Status: DC
Start: 2024-02-02 — End: 2024-02-04
  Administered 2024-02-02 – 2024-02-04 (×3): 25 mg via ORAL
  Filled 2024-02-01 (×3): qty 1

## 2024-02-01 MED ORDER — SENNOSIDES-DOCUSATE SODIUM 8.6-50 MG PO TABS
2.0000 | ORAL_TABLET | Freq: Every day | ORAL | Status: DC
  Administered 2024-02-01 – 2024-02-14 (×8): 2 via ORAL
  Filled 2024-02-01 (×12): qty 2

## 2024-02-01 NOTE — Progress Notes (Signed)
 PROGRESS NOTE   Subjective/Complaints: No new complaints or concerns this morning.  Working with SLP.  ROS: Patient denies chills, headaches, shortness of breath, chest pain, abdominal pain, nausea, vomiting, dizziness      Objective:   No results found.  Recent Labs    01/31/24 0524  WBC 4.6  HGB 12.0  HCT 37.0  PLT 194   Recent Labs    01/31/24 0524 02/01/24 0557  NA 141 141  K 3.9 4.1  CL 108 108  CO2 23 20*  GLUCOSE 132* 130*  BUN 33* 29*  CREATININE 1.61* 1.36*  CALCIUM  9.1 9.2    Intake/Output Summary (Last 24 hours) at 02/01/2024 1150 Last data filed at 02/01/2024 0842 Gross per 24 hour  Intake 120 ml  Output 200 ml  Net -80 ml        Physical Exam: Vital Signs Blood pressure 126/72, pulse 77, temperature 97.9 F (36.6 C), temperature source Oral, resp. rate 16, height 5' 3 (1.6 m), weight 82.8 kg, SpO2 95%.  Constitutional: No distress . Vital signs reviewed.  Sitting in bed working with SLP HEENT: NCAT, EOMI, oral membranes moist Neck: supple Cardiovascular: RRR without murmur. No JVD    Respiratory/Chest: CTA Bilaterally without wheezes or rales. Non labored GI/Abdomen: BS +, non-tender, non-distended Ext: no clubbing, cyanosis, tr edema right lower extremity, 2+ left lower extremity -chronic for many years Psych: pleasant and cooperative  Skin: Clean and intact without signs of breakdown on visible portion  Neuro: Alert and oriented x 4, follows simple commands, able to name and repeat, left homonymous hemianopsia-unchanged, right gaze preference able to cross midline.  Still has a lot of difficulty with visual scanning to the left.  Left facial weakness.  Right upper and lower extremity strength appear to be intact. Left upper 4-5 and lower extremity 3-4 out of 5 more proximal and distal  Prior neuro assessment is c/w today's exam 02/01/2024.   MSK: healed TKA incisions, no joint  abnormality noted    Assessment/Plan: 1. Functional deficits which require 3+ hours per day of interdisciplinary therapy in a comprehensive inpatient rehab setting. Physiatrist is providing close team supervision and 24 hour management of active medical problems listed below. Physiatrist and rehab team continue to assess barriers to discharge/monitor patient progress toward functional and medical goals  Care Tool:  Bathing    Body parts bathed by patient: Left arm, Chest, Front perineal area, Right upper leg, Left upper leg, Face, Right arm, Abdomen, Buttocks, Right lower leg, Left lower leg   Body parts bathed by helper: Abdomen, Right arm, Buttocks, Right lower leg, Left lower leg     Bathing assist Assist Level: Supervision/Verbal cueing     Upper Body Dressing/Undressing Upper body dressing   What is the patient wearing?: Pull over shirt    Upper body assist Assist Level: Moderate Assistance - Patient 50 - 74%    Lower Body Dressing/Undressing Lower body dressing      What is the patient wearing?: Pants, Incontinence brief     Lower body assist Assist for lower body dressing: Maximal Assistance - Patient 25 - 49%     Toileting Toileting  Toileting assist Assist for toileting: Dependent - Patient 0%     Transfers Chair/bed transfer  Transfers assist     Chair/bed transfer assist level: Moderate Assistance - Patient 50 - 74%     Locomotion Ambulation   Ambulation assist      Assist level: 2 helpers (modA with w/c follow for safety) Assistive device: Other (comment) (R hand rail) Max distance: 30'   Walk 10 feet activity   Assist     Assist level: 2 helpers (modA with w/c follow for safety) Assistive device: Other (comment) (R hand rail)   Walk 50 feet activity   Assist Walk 50 feet with 2 turns activity did not occur: Safety/medical concerns         Walk 150 feet activity   Assist Walk 150 feet activity did not occur:  Safety/medical concerns         Walk 10 feet on uneven surface  activity   Assist Walk 10 feet on uneven surfaces activity did not occur: Safety/medical concerns         Wheelchair     Assist Is the patient using a wheelchair?: Yes Type of Wheelchair: Manual    Wheelchair assist level: Moderate Assistance - Patient 50 - 74% Max wheelchair distance: 26'    Wheelchair 50 feet with 2 turns activity    Assist        Assist Level: Moderate Assistance - Patient 50 - 74%   Wheelchair 150 feet activity     Assist      Assist Level: Total Assistance - Patient < 25%   Blood pressure 126/72, pulse 77, temperature 97.9 F (36.6 C), temperature source Oral, resp. rate 16, height 5' 3 (1.6 m), weight 82.8 kg, SpO2 95%.  Medical Problem List and Plan: 1. 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  Antithrombotics: -DVT/anticoagulation:  Pharmaceutical: Eliquis              -antiplatelet therapy: ECASA 3. Pain Management: tylenol  prn. Voltaren  gel qid to knee.  4. Mood/Behavior/Sleep: LCSW to follow for evaluation and support.             --Melatonin prn.              -antipsychotic agents: N/A 5. Neuropsych/cognition: This patient is capable of making decisions on her own behalf. 6. Skin/Wound Care: Routine pressure relief measures.  7. Fluids/Electrolytes/Nutrition: Monitor I/O. Check CMET in am.  8. A fib: Monitor HR TID--on metoprolol  --Eliquis  resumed 09/18, heart rate stable 9. HTN: Monitor BP TID--on amlodipine , Lotensin  and Metoprolol .  Avoid hypotension and with  long-term goal normotensive             -Fair control continue to monitor, internal medicine suggests outpatient on thiazide with benazepril  combo pill to reduce pill burden  -9/20 BP mildly elevated, continue current regimen for now would like to avoid causing hypotension  -9/26 occasional elevated blood pressure but overall controlled,  continue to monitor    02/01/2024    5:33 AM 02/01/2024    5:22 AM 01/31/2024    8:10 PM  Vitals with BMI  Weight 182 lbs 9 oz 182 lbs 5 oz   BMI 32.34 32.3   Systolic 126  160  Diastolic 72  71  Pulse 77  77      10. CKD: BUN/SCr 30/1.40 @ admission.  --Baseline likely 1.3-1.5 in the last few months.              --Recheck in am.              - 9/19 reorder CMP, initial lab draw hemolyzed  -9/22 BUN/Cr   elevated beyond baseline today but within her variance going back to June labs.  Jardiance  resumed 9/18 which should have helped. She is on lotensin , however. Consider holding if further trend up   -for now encourage fluids   -recheck BMET in AM  -9/23 Cr down to 1.48, around her baseline, BUN a little higher at 35, continue to encourage oral fluids  -9/25 creatinine a little higher again today at at 1.61/BUN 33, encourage oral fluids, will hold Jardiance  until recheck tomorrow.  Not a lot of fluid intake recorded.  Discussed encouraging fluids with nursing  -9/26 creatinine/BUN much improved today to 1.36/29.  Continue oral fluid intake.  Start Jardiance  11. T2DM: Hgb A1c- 6.5 and well controlled. Was on Semaglutide  1 mg, Amaryl  1 mg and Jardiance  25 mg PTA             --Jardiance  resumed on 09/18 --Monitor BS ac/hs and use SSI for elevated BS -9/19-22  fair control continue to monitor trend  9/23 controlled, discussed with pharmacy will DC sliding scale and change CBGs to twice a day -9/24 controlled overall, decrease CBG to daily -9/25 controlled continue to monitor daily for now CBG stable today consider discontinue CBGs CBG (last 3)  Recent  Labs    01/29/24 1640 01/30/24 0628 02/01/24 0556  GLUCAP 147* 121* 119*      12. 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  13. Scoliosis/Spondylolisthesis: Has hx of chronic LBP w/BLE weakness --also endstage OA right knee--Diclofenac  gel qid 14. Hyperthyroid: Managed with Tapazole  5 mg daily.  15. Hyperlipidemia: LDL 123 w/goal < 70.  --On Crestor  20 mg (was increased) and Zetia .  16. Constipation             -Start miralax   -9/26 LBM 9/24, increase senna to 2 tabs at bedtime [place]          LOS: 8 days A FACE TO FACE EVALUATION WAS PERFORMED  Murray Collier 02/01/2024, 11:50 AM

## 2024-02-01 NOTE — Plan of Care (Signed)
 Goals downgraded due to progress made thus far and severity of impairments  Problem: RH Problem Solving Goal: LTG Patient will demonstrate problem solving for (SLP) Description: LTG:  Patient will demonstrate problem solving for basic/complex daily situations with cues  (SLP) Flowsheets Taken 02/01/2024 0850 LTG: Patient will demonstrate problem solving for (SLP): Basic daily situations Taken 01/25/2024 0932 LTG Patient will demonstrate problem solving for: Supervision   Problem: RH Memory Goal: LTG Patient will demonstrate ability for day to day (SLP) Description: LTG:   Patient will demonstrate ability for day to day recall/carryover during cognitive/linguistic activities with assist  (SLP) Flowsheets Taken 02/01/2024 0850 LTG: Patient will demonstrate ability for day to day recall/carryover during cognitive/linguistic activities with assist (SLP): Minimal Assistance - Patient > 75% Taken 01/25/2024 0932 LTG: Patient will demonstrate ability for day to day recall: New information   Problem: RH Attention Goal: LTG Patient will demonstrate this level of attention during functional activites (SLP) Description: LTG:  Patient will will demonstrate this level of attention during functional activites (SLP) Flowsheets Taken 02/01/2024 0850 Patient will demonstrate during cognitive/linguistic activities the attention type of: Sustained Patient will demonstrate this level of attention during cognitive/linguistic activities in: Controlled LTG: Patient will demonstrate this level of attention during cognitive/linguistic activities with assistance of (SLP): Minimal Assistance - Patient > 75% Taken 01/25/2024 0932 Number of minutes patient will demonstrate attention during cognitive/linguistic activities: 20 minutes

## 2024-02-01 NOTE — Progress Notes (Signed)
 Physical Therapy Session Note  Patient Details  Name: Megan Fischer MRN: [id number] Date of Birth: 1951-04-04  Today's Date: 02/01/2024 PT Individual Time: 8599-8575 PT Individual Time Calculation (min): 24 min   Short Term Goals: Week 1:  PT Short Term Goal 1 (Week 1): Pt will complete bed mobility with CGA PT Short Term Goal 2 (Week 1): Pt will complete bed<>chair transfers with minA and LRAD PT Short Term Goal 3 (Week 1): Pt will ambulate 32ft with minA and LRAD PT Short Term Goal 4 (Week 1): Pt will complete functional outcome measure to assess falls risk  Skilled Therapeutic Interventions/Progress Updates:   Received pt semi-reclined in bed reporting urge to void. Pt agreeable to PT treatment and denied any pain during session. Session with emphasis on functional mobility/transfers, toileting, generalized strengthening and endurance, and dynamic standing balance/coordination. Pt transferred semi-reclined<>sitting L EOB with HOB elevated and supervision. Due to urgency, stood in [place] with min A and transported to/from toilet with bedside commode over top dependently. Stood from [place] flaps with close supervision and required max A for clothing management. Pt able to void and performed hygiene management in standing with CGA for balance. Sat in [place] to perform hand hygiene then requested to return to bed - transferred into supine with supervision. Concluded session with pt semi-reclined in bed, needs within reach, and bed alarm on.   Therapy Documentation Precautions:  Precautions Precautions: Fall Recall of Precautions/Restrictions: Impaired Precaution/Restrictions Comments: L homonymous hemianopsia, L hemi, B TKA's pending revision per family Restrictions Weight Bearing Restrictions Per Provider Order: No  Therapy/Group: Individual Therapy [name] [name] PT, DPT 02/01/2024, 7:15 AM

## 2024-02-01 NOTE — Progress Notes (Signed)
 Speech Language Pathology Weekly Progress and Session Note  Patient Details  Name: Megan Fischer MRN: [id number] Date of Birth: 1951-05-04  Beginning of progress report period: January 25, 2024 End of progress report period: February 01, 2024  Today's Date: 02/01/2024 SLP Individual Time: 0830-0930 SLP Individual Time Calculation (min): 60 min  Short Term Goals: Week 1: SLP Short Term Goal 1 (Week 1): Patient will attend to the L during functional therapy tasks to identify L presenting task components in 4/5 opportunities given min assist. SLP Short Term Goal 1 - Progress (Week 1): Progressing toward goal SLP Short Term Goal 2 (Week 1): Patient will recall and manipulate functional information during problem solving tasks in 80% of trials given min assist. SLP Short Term Goal 2 - Progress (Week 1): Progressing toward goal SLP Short Term Goal 3 (Week 1): Patient will solve mildly complex functional problems with 80% accuracy given overall min assist. SLP Short Term Goal 3 - Progress (Week 1): Progressing toward goal  New Short Term Goals: Week 2: SLP Short Term Goal 1 (Week 2): Patient will attend to the L during functional therapy tasks to identify L presenting task components in 4/5 opportunities given min assist. SLP Short Term Goal 2 (Week 2): Patient will recall and manipulate functional information during problem solving tasks in 80% of trials given min assist. SLP Short Term Goal 3 (Week 2): Patient will solve functional problems with 80% accuracy given overall min assist.  Weekly Progress Updates: Patient has made steady progress throughout this reporting period, progressing towards all short term goals set. Patient currently benefits from min to mod assist for functional L attention during structured therapy tasks. She recalls broad daily information with supervision but requires min to mod assist for details and to recall education re: current situation. Patient requires min to  mod assist to solve mildly complex functional problems. Due to progress made thus far, patient's goals downgraded to min assist overall to reflect expected level of assist needed by discharge date. Patient and family education ongoing. Patient will continue to benefit from skilled therapy services during remainder of CIR stay.     Intensity: Minumum of 1-2 x/day, 30 to 90 minutes Frequency: 3 to 5 out of 7 days Duration/Length of Stay: 10/8 Treatment/Interventions: Cognitive remediation/compensation;Internal/external aids;Cueing hierarchy;Therapeutic Activities;Functional tasks;Patient/family education  Daily Session  Skilled Therapeutic Interventions: SLP conducted skilled therapy session targeting cognitive goals. Upon SLP entry, patient completing bed level toileting, benefiting from min cues for sequencing and L attention. SLP then facilitated scanning task where patient utilized working memory to recall and manipulate task instructions in one-unit segments with minA and scanned activity thoroughly with min to mod assist. In remaining minutes of session, SLP facilitated functional meal planning task to increase awareness of deficits. Patient required min to mod assist to sustain attention during 15 minute task and streamline meal choices as well as mod assist to identify  aspects she would need assistance with from family at discharge. Patient was left in room with call bell in reach and alarm set. SLP will continue to target goals per plan of care.        Pain Pain Assessment Pain Scale: 0-10 Pain Score: 4  Pain Location: Shoulder  Therapy/Group: Individual Therapy  Marjoria Mancillas, M.A., CCC-SLP  Malin Cervini A Raynold Blankenbaker 02/01/2024, 9:32 AM

## 2024-02-01 NOTE — Progress Notes (Signed)
 Occupational Therapy Weekly Progress Note  Patient Details  Name: Megan Fischer MRN: [id number] Date of Birth: [birth year]  Beginning of progress report period: January 25, 2024 End of progress report period: February 01, 2024  Today's Date: 02/01/2024 OT Individual Time: 9052-8955 OT Individual Time Calculation (min): 57 min    Patient has met 1 of 3 short term goals.  And inconsistently met the other two short term goals.  Patient continues to demonstrate the following deficits: muscle weakness and muscle paralysis, field cut, decreased attention and decreased safety awareness, and decreased standing balance and hemiplegia and therefore will continue to benefit from skilled OT intervention to enhance overall performance with BADL.  Patient progressing toward long term goals..  Continue plan of care.  OT Short Term Goals Week 1:  OT Short Term Goal 1 (Week 1): Patient to perform upper body dressing with Min assist OT Short Term Goal 1 - Progress (Week 1): Progressing toward goal OT Short Term Goal 2 (Week 1): Patient to perform toilet transfer with Mod assist of one OT Short Term Goal 2 - Progress (Week 1): Met OT Short Term Goal 3 (Week 1): Patient to perform shower seated with Mod assist and AE as needed OT Short Term Goal 3 - Progress (Week 1): Progressing toward goal Week 2:  OT Short Term Goal 1 (Week 2): Patient to perform upper body dressing with Min assist OT Short Term Goal 2 (Week 2): Patient to perform shower seated with Mod assist and AE as needed OT Short Term Goal 3 (Week 2): Patient to perform shower transfer with Mod assist of one.  Skilled Therapeutic Interventions/Progress Updates: Patient seen for ADL's including transfer into the shower. Patient able to roll to left and sit EOB with SBA. Utilized STEDY for sit to stand with Min A and transfer into the shower. Patient did well washing upper body seated. Needed assist washing lower legs and cues for thoroughness.  Assisted patient with drying. LE dressing Total assist. Patinet needed Mod assist for LB dressing but improving towards goal of Min assist.  Patient continued to have limited safety awareness with regard to current needs and high risk of falling. Continue with patient and family training for Min assist functional level to achieve long term goals.      Therapy Documentation Precautions:  Precautions Precautions: Fall Recall of Precautions/Restrictions: Impaired Precaution/Restrictions Comments: L homonymous hemianopsia, L hemi, B TKA's pending revision per family Restrictions Weight Bearing Restrictions Per Provider Order: No General:   Vital Signs: Therapy Vitals Temp: 97.9 F (36.6 C) Temp Source: Oral Pulse Rate: 77 Resp: 16 BP: 126/72 Patient Position (if appropriate): Lying Oxygen Therapy SpO2: 95 % O2 Device: Room Air Pain: Pain Assessment Pain Scale: 0-10 Pain Score: 4  Pain Location: Shoulder ADL: ADL Eating: Set up Where Assessed-Eating: Bed level Grooming: Setup Where Assessed-Grooming: Sitting at sink Upper Body Bathing: Min assistance Where Assessed-Upper Body Bathing: shower Lower Body Bathing: Mod assistance Where Assessed-Lower Body Bathing: shower Upper Body Dressing: Mod assistance Where Assessed-Upper Body Dressing: Edge of bed Lower Body Dressing: Max Where Assessed-Lower Body Dressing: Edge of bed Toileting: Dependent Where Assessed-Toileting: Teacher, adult education: Animator Method: [employer]: Extra wide drop arm bedside commode, Acupuncturist:Mod Film/video editor Method: Stedy    Therapy/Group: Individual Therapy  [name] 02/01/2024, 12:33 PM

## 2024-02-01 NOTE — Progress Notes (Signed)
 Speech Language Pathology Daily Session Note  Patient Details  Name: Megan Fischer MRN: [id number] Date of Birth: 01-Jan-1951  Today's Date: 02/01/2024 SLP Individual Time: 8950-8851 SLP Individual Time Calculation (min): 59 min  Short Term Goals: Week 2: SLP Short Term Goal 1 (Week 2): Patient will attend to the L during functional therapy tasks to identify L presenting task components in 4/5 opportunities given min assist. SLP Short Term Goal 2 (Week 2): Patient will recall and manipulate functional information during problem solving tasks in 80% of trials given min assist. SLP Short Term Goal 3 (Week 2): Patient will solve functional problems with 80% accuracy given overall min assist.  Skilled Therapeutic Interventions: Skilled therapy session focused on cognitive goals. SLP facilitated the session by building rapport with the patient through encouraging her to share biographical information. The patient shared details regarding herself, her family, job, and medical history. SLP targeted left attention and problem-solving goals through a medication management task. The patient required minimal assistance to follow written and verbal directions to complete a BID pillbox. The patient made three errors but was able to self-correct with minimal verbal assistance. At the conclusion of the session, the patient sorted beads according to color across the table span. She required supervision to moderate independent assistance to complete this task.Patient was left in bed with the alarm set and call bell within reach.   Pain Denies  Therapy/Group: Individual Therapy  Jaeven Wanzer M.A., CCC-SLP 02/01/2024, 8:56 AM

## 2024-02-02 DIAGNOSIS — I63531 Cerebral infarction due to unspecified occlusion or stenosis of right posterior cerebral artery: Secondary | ICD-10-CM | POA: Diagnosis not present

## 2024-02-02 LAB — GLUCOSE, CAPILLARY: Glucose-Capillary: 153 mg/dL — ABNORMAL HIGH (ref 70–99)

## 2024-02-02 MED ORDER — METOPROLOL SUCCINATE ER 50 MG PO TB24
62.5000 mg | ORAL_TABLET | Freq: Every day | ORAL | Status: DC
Start: 2024-02-03 — End: 2024-02-14
  Administered 2024-02-03 – 2024-02-14 (×12): 62.5 mg via ORAL
  Filled 2024-02-02 (×12): qty 1

## 2024-02-02 MED ORDER — POLYETHYLENE GLYCOL 3350 17 G PO PACK
17.0000 g | PACK | Freq: Two times a day (BID) | ORAL | Status: DC
  Administered 2024-02-03: 17 g via ORAL
  Filled 2024-02-02 (×2): qty 1

## 2024-02-02 NOTE — Plan of Care (Signed)
  Problem: Consults Goal: RH STROKE PATIENT EDUCATION Description: See Patient Education module for education specifics  Outcome: Progressing   Problem: RH BOWEL ELIMINATION Goal: RH STG MANAGE BOWEL WITH ASSISTANCE Description: STG Manage Bowel with mod I Assistance. Outcome: Progressing Goal: RH STG MANAGE BOWEL W/MEDICATION W/ASSISTANCE Description: STG Manage Bowel with Medication with mod I Assistance. Outcome: Progressing   Problem: RH BLADDER ELIMINATION Goal: RH STG MANAGE BLADDER WITH ASSISTANCE Description: STG Manage Bladder With toileting Assistance Outcome: Progressing   Problem: RH SAFETY Goal: RH STG ADHERE TO SAFETY PRECAUTIONS W/ASSISTANCE/DEVICE Description: STG Adhere to Safety Precautions With cues Assistance/Device. Outcome: Progressing   Problem: RH PAIN MANAGEMENT Goal: RH STG PAIN MANAGED AT OR BELOW PT'S PAIN GOAL Description: < 4 with prns Outcome: Progressing   Problem: RH KNOWLEDGE DEFICIT Goal: RH STG INCREASE KNOWLEDGE OF DIABETES Description: Patient and dtr will be able to manage DM using educational resources for medications and dietary modification independently Outcome: Progressing Goal: RH STG INCREASE KNOWLEDGE OF HYPERTENSION Description: Patient and dtr will be able to manage HTN using educational resources for medications and dietary modification independently Outcome: Progressing Goal: RH STG INCREASE KNOWLEDGE OF DYSPHAGIA/FLUID INTAKE Description: Patient and dtr will be able to manage Dysphagia using educational resources for medications and dietary modification independently Outcome: Progressing Goal: RH STG INCREASE KNOWLEGDE OF HYPERLIPIDEMIA Description: Patient and dtr will be able to manage HLD using educational resources for medications and dietary modification independently Outcome: Progressing Goal: RH STG INCREASE KNOWLEDGE OF STROKE PROPHYLAXIS Description: Patient and dtr will be able to manage secondary risks using  educational resources for medications and dietary modification independently Outcome: Progressing   Problem: RH Vision Goal: RH LTG Vision (Specify) Outcome: Progressing

## 2024-02-02 NOTE — Progress Notes (Signed)
 PROGRESS NOTE   Subjective/Complaints: No new complaints this morning CBGS reviewed and are 120s-150s Patient's chart reviewed- No issues reported overnight Vitals signs stable except for HTN  ROS: Patient denies chills, headaches, shortness of breath, chest pain, abdominal pain, nausea, vomiting, dizziness      Objective:   No results found.  Recent Labs    01/31/24 0524  WBC 4.6  HGB 12.0  HCT 37.0  PLT 194   Recent Labs    01/31/24 0524 02/01/24 0557  NA 141 141  K 3.9 4.1  CL 108 108  CO2 23 20*  GLUCOSE 132* 130*  BUN 33* 29*  CREATININE 1.61* 1.36*  CALCIUM  9.1 9.2    Intake/Output Summary (Last 24 hours) at 02/02/2024 1514 Last data filed at 02/01/2024 1900 Gross per 24 hour  Intake 177 ml  Output --  Net 177 ml        Physical Exam: Vital Signs Blood pressure (!) 157/99, pulse 81, temperature 98.7 F (37.1 C), temperature source Oral, resp. rate 16, height 5' 3 (1.6 m), weight 82.7 kg, SpO2 99%.  Constitutional: No distress . Vital signs reviewed.  Sitting in bed working with SLP, lying in bed comfortably HEENT: NCAT, EOMI, oral membranes moist Neck: supple Cardiovascular: RRR without murmur. No JVD    Respiratory/Chest: CTA Bilaterally without wheezes or rales. Non labored GI/Abdomen: BS +, non-tender, non-distended Ext: no clubbing, cyanosis, tr edema right lower extremity, 2+ left lower extremity -chronic for many years Psych: pleasant and cooperative  Skin: Clean and intact without signs of breakdown on visible portion  Neuro: Alert and oriented x 4, follows simple commands, able to name and repeat, left homonymous hemianopsia-unchanged, right gaze preference able to cross midline.  Still has a lot of difficulty with visual scanning to the left.  Left facial weakness.  Right upper and lower extremity strength appear to be intact. Left upper 4-5 and lower extremity 3-4 out of 5 more  proximal and distal  Prior neuro assessment is c/w today's exam 02/02/2024.   MSK: healed TKA incisions, no joint abnormality noted    Assessment/Plan: 1. Functional deficits which require 3+ hours per day of interdisciplinary therapy in a comprehensive inpatient rehab setting. Physiatrist is providing close team supervision and 24 hour management of active medical problems listed below. Physiatrist and rehab team continue to assess barriers to discharge/monitor patient progress toward functional and medical goals  Care Tool:  Bathing    Body parts bathed by patient: Left arm, Chest, Front perineal area, Right upper leg, Left upper leg, Face, Right arm, Abdomen, Buttocks, Right lower leg, Left lower leg   Body parts bathed by helper: Abdomen, Right arm, Buttocks, Right lower leg, Left lower leg     Bathing assist Assist Level: Supervision/Verbal cueing     Upper Body Dressing/Undressing Upper body dressing   What is the patient wearing?: Pull over shirt    Upper body assist Assist Level: Moderate Assistance - Patient 50 - 74%    Lower Body Dressing/Undressing Lower body dressing      What is the patient wearing?: Pants, Incontinence brief     Lower body assist Assist for lower body dressing:  Maximal Assistance - Patient 25 - 49%     Toileting Toileting    Toileting assist Assist for toileting: Dependent - Patient 0%     Transfers Chair/bed transfer  Transfers assist     Chair/bed transfer assist level: Moderate Assistance - Patient 50 - 74%     Locomotion Ambulation   Ambulation assist      Assist level: 2 helpers (modA with w/c follow for safety) Assistive device: Other (comment) (R hand rail) Max distance: 30'   Walk 10 feet activity   Assist     Assist level: 2 helpers (modA with w/c follow for safety) Assistive device: Other (comment) (R hand rail)   Walk 50 feet activity   Assist Walk 50 feet with 2 turns activity did not occur:  Safety/medical concerns         Walk 150 feet activity   Assist Walk 150 feet activity did not occur: Safety/medical concerns         Walk 10 feet on uneven surface  activity   Assist Walk 10 feet on uneven surfaces activity did not occur: Safety/medical concerns         Wheelchair     Assist Is the patient using a wheelchair?: Yes Type of Wheelchair: Manual    Wheelchair assist level: Moderate Assistance - Patient 50 - 74% Max wheelchair distance: 60'    Wheelchair 50 feet with 2 turns activity    Assist        Assist Level: Moderate Assistance - Patient 50 - 74%   Wheelchair 150 feet activity     Assist      Assist Level: Total Assistance - Patient < 25%   Blood pressure (!) 157/99, pulse 81, temperature 98.7 F (37.1 C), temperature source Oral, resp. rate 16, height 5' 3 (1.6 m), weight 82.7 kg, SpO2 99%.  Medical Problem List and Plan: 1. 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  Antithrombotics: -DVT/anticoagulation:  Pharmaceutical: Eliquis              -antiplatelet therapy: ECASA 3. Pain Management: tylenol  prn. Voltaren  gel qid to knee.  4. Mood/Behavior/Sleep: LCSW to follow for evaluation and support.             --Melatonin prn.              -antipsychotic agents: N/A 5. Neuropsych/cognition: This patient is capable of making decisions on her own behalf. 6. Skin/Wound Care: Routine pressure relief measures.  7. Fluids/Electrolytes/Nutrition: Monitor I/O. Check CMET in am.  8. A fib: Monitor HR TID--on metoprolol  --Eliquis  resumed 09/18,  heart rate stable 9. HTN: Monitor BP TID--on amlodipine , Lotensin  and Metoprolol .  Avoid hypotension and with long-term goal normotensive             -Fair control continue to monitor, internal medicine suggests outpatient on thiazide with benazepril  combo pill to reduce pill burden  -9/20 BP mildly elevated, continue current regimen for  now would like to avoid causing hypotension  Increase toprol  XL to 62.5mg  daily    02/02/2024    5:16 AM 02/02/2024    4:08 AM 02/01/2024    7:25 PM  Vitals with BMI  Weight 182 lbs 5 oz    BMI 32.3    Systolic  157 137  Diastolic  99 74  Pulse  81 81      10. CKD: BUN/SCr 30/1.40 @ admission.  --Baseline likely 1.3-1.5 in the last few months.              --Recheck in am.              - 9/19 reorder CMP, initial lab draw hemolyzed  -9/22 BUN/Cr   elevated beyond baseline today but within her variance going back to June labs.  Jardiance  resumed 9/18 which should have helped. She is on lotensin , however. Consider holding if further trend up   -for now encourage fluids   -recheck BMET in AM  -9/23 Cr down to 1.48, around her baseline, BUN a little higher at 35, continue to encourage oral fluids  -9/25 creatinine a little higher again today at at 1.61/BUN 33, encourage oral fluids, will hold Jardiance  until recheck tomorrow.  Not a lot of fluid intake recorded.  Discussed encouraging fluids with nursing  -9/26 creatinine/BUN much improved today to 1.36/29.  Continue oral fluid intake.  Start Jardiance  11. T2DM: Hgb A1c- 6.5 and well controlled. Was on Semaglutide  1 mg, Amaryl  1 mg and Jardiance  25 mg PTA             --Jardiance  resumed on 09/18 --Monitor BS ac/hs and use SSI for elevated BS -9/19-22  fair control continue to monitor trend  9/23 controlled, discussed with pharmacy will DC sliding scale and change CBGs to twice a day -9/24 controlled overall, decrease CBG to daily -9/25 controlled continue to monitor daily for now CBG stable today  consider discontinue CBGs CBG (last 3)  Recent Labs    02/01/24 0556 02/02/24 0532  GLUCAP 119* 153*      12. 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  13. Scoliosis/Spondylolisthesis: Has hx of chronic LBP w/BLE weakness --also endstage OA right knee--Diclofenac  gel qid 14. Hyperthyroid: continue Tapazole  5 mg daily.   15. Hyperlipidemia: LDL 123 w/goal < 70.  --continue Crestor  20 mg (was increased) and Zetia .   16. Constipation             -Start miralax   -9/26 LBM 9/24, increase senna to 2 tabs at bedtime Foley  Increase miralax  to BID          LOS: 9 days A FACE TO FACE EVALUATION WAS PERFORMED  Sven SQUIBB Darrol Brandenburg 02/02/2024, 3:14 PM

## 2024-02-03 DIAGNOSIS — I63531 Cerebral infarction due to unspecified occlusion or stenosis of right posterior cerebral artery: Secondary | ICD-10-CM | POA: Diagnosis not present

## 2024-02-03 LAB — GLUCOSE, CAPILLARY: Glucose-Capillary: 120 mg/dL — ABNORMAL HIGH (ref 70–99)

## 2024-02-03 MED ORDER — POLYETHYLENE GLYCOL 3350 17 G PO PACK
17.0000 g | PACK | Freq: Every day | ORAL | Status: DC
  Administered 2024-02-05 – 2024-02-15 (×8): 17 g via ORAL
  Filled 2024-02-03 (×12): qty 1

## 2024-02-03 NOTE — Progress Notes (Signed)
 Physical Therapy Session Note  Patient Details  Name: Megan Fischer MRN: [id number] Date of Birth: October 27, 1950  Today's Date: 02/03/2024 PT Individual Time: 1443-1530 PT Individual Time Calculation (min): 47 min   Short Term Goals: Week 1:  PT Short Term Goal 1 (Week 1): Pt will complete bed mobility with CGA PT Short Term Goal 1 - Progress (Week 1): Progressing toward goal PT Short Term Goal 2 (Week 1): Pt will complete bed<>chair transfers with minA and LRAD PT Short Term Goal 2 - Progress (Week 1): Partly met (pt is completing with min A with use of steady) PT Short Term Goal 3 (Week 1): Pt will ambulate 36ft with minA and LRAD PT Short Term Goal 3 - Progress (Week 1): Revised due to lack of progress PT Short Term Goal 4 (Week 1): Pt will complete functional outcome measure to assess falls risk PT Short Term Goal 4 - Progress (Week 1): Not met Week 2:  PT Short Term Goal 1 (Week 2): pt will perform bed to chair transfer with LRAD and min A PT Short Term Goal 2 (Week 2): pt will perform sit to stand with LRAD and min A PT Short Term Goal 3 (Week 2): pt will perform bed mobility with LRAD and CGA  Skilled Therapeutic Interventions/Progress Updates:      Pt supine in bed upon arrival. Pt required increased encouragement but agreeable to therapy. Pt denies any pain.   Bed mobility: supine to sit: supervision with use of bed features, sit to supine: CGA with increased time and max verbal cues provided for sequencing with emphasis on scooting back and to L as far as she can prior to lifting legs into the bed for improved stability of L LE.   Sit to stand/stand pivot transfer/squat pivot transfer: mod A   Gait x10 feet FWD/BWD in // bars with verbal and tactile cues provided for upright posture as pt demonstrates heavy trunk flexion compensation for advancement. Pt demonstrates significant L LE knee hyperextension.   Therapy Documentation Precautions:  Precautions Precautions:  Fall Recall of Precautions/Restrictions: Impaired Precaution/Restrictions Comments: L homonymous hemianopsia, L hemi, B TKA's pending revision per family Restrictions Weight Bearing Restrictions Per Provider Order: No   Therapy/Group: Individual Therapy  [facility] [place], [place], DPT  02/03/2024, 4:09 PM

## 2024-02-03 NOTE — Plan of Care (Signed)
  Problem: RH Ambulation Goal: LTG Patient will ambulate in home environment (PT) Description: LTG: Patient will ambulate in home environment, # of feet with assistance (PT). Outcome: Not Applicable Flowsheets (Taken 02/03/2024 1607) LTG: Pt will ambulate in home environ  assist needed:: (discontinued home ambulation goal as not anticiptating pt to be functional ambulator upon discharge) --

## 2024-02-03 NOTE — Progress Notes (Signed)
 Occupational Therapy Session Note  Patient Details  Name: Megan Fischer MRN: [id number] Date of Birth: 01-09-1951  Today's Date: 02/03/2024 OT Individual Time: 0900-1015 OT Individual Time Calculation (min): 75 min    Short Term Goals: Week 1:  OT Short Term Goal 1 (Week 1): Patient to perform upper body dressing with Min assist OT Short Term Goal 1 - Progress (Week 1): Progressing toward goal OT Short Term Goal 2 (Week 1): Patient to perform toilet transfer with Mod assist of one OT Short Term Goal 2 - Progress (Week 1): Met OT Short Term Goal 3 (Week 1): Patient to perform shower seated with Mod assist and AE as needed OT Short Term Goal 3 - Progress (Week 1): Progressing toward goal  Skilled Therapeutic Interventions/Progress Updates:    Patient in bed at the time of arrival.The pt had no pain to report at the time of treatment and went on to say that she rested well during the night. The pt was in agreement with completing a BADL related task in showering. The pt was able to come from supine in bed to EOB with Mod to MinA for sitting EOB.  The pt was repositioned at the EOB with her feet supported and was able to maintain good sitting balance . The pt was transferred to the restroom using the stedy for coming into stand at Mod to [facility]. The pt was transported to the shower chair and was able to come from sit to stand for doffing her LB garments at [facility] for placement on the shower chair. The pt was able to bath her UB and the upper region of BLE with s/uA.  The pt was Dep  for  the more distal LE inclusive of her feet. The pt was able to dry off and apply deo with s/uA, she was s/uA for lotion as well. The pt was able to donn her over head shirt with s/uA, she was Dep with her brief and non skid socks, and she was MaxA for her pants. The pt was transported to her living quarters and was able to brush her dentures with s/uA. The pt was able to transfer to the w/c with Mod to MinA for placement  using the stedy. At the end of the session, the call light and bedside table were put in place with the chair alarm activated.   Therapy Documentation Precautions:  Precautions Precautions: Fall Recall of Precautions/Restrictions: Impaired Precaution/Restrictions Comments: L homonymous hemianopsia, L hemi, B TKA's pending revision per family Restrictions Weight Bearing Restrictions Per Provider Order: No   Therapy/Group: Individual Therapy  [name] 02/03/2024, 12:22 PM

## 2024-02-03 NOTE — Progress Notes (Signed)
 Physical Therapy Weekly Progress Note  Patient Details  Name: Megan Fischer MRN: [id number] Date of Birth: Jun 28, 1950  Beginning of progress report period: March 26, 2024 End of progress report period: April 04, 2024  Patient has met 0 of 4 short term goals, however is progressing towards 2/4 of the goals. Pt has made limited progress towards goals 2/2 to poor safety awareness, poor awareness of deficits, difficulty sustaining attention, poor problem solving, L LE weakness/laxity, B knee and R shoulder pain. Pt is currently performing supine to sit with use of bed features and supervision, sit to supine with CGA-mod A, sit to stand with mod A with use of bed rail, and min A with use of steady , stand pivot transfer with mod A. Pt is ambulating up to 30 feet with L HHA and R UE support on rail however limited by pain/fatigue/L LE knee hyperextension. Trailed power WC 9/25 per pt and family request 2/2 history of numerous falls in last 6 months 2/2 knee buckling however pt is unsafe to utilize PWC at this time 2/2  cognitive and visual deficits. PT recommending use to steady transfer for home for pt and caregiver safety. Initiated family training on 9/25. Will continue to work on progressing independence with mobility and hands on family training.   Patient continues to demonstrate the following deficits muscle weakness and muscle joint tightness, decreased cardiorespiratoy endurance, abnormal tone and decreased coordination, hemianopsia, decreased attention to left, left side neglect, and decreased motor planning, decreased attention, decreased awareness, decreased problem solving, decreased safety awareness, and decreased memory, and decreased sitting balance, decreased standing balance, decreased postural control, hemiplegia, and decreased balance strategies and therefore will continue to benefit from skilled PT intervention to increase functional independence with mobility.  Patient not  progressing toward long term goals.  See goal revision..  Plan of care revisions: 02/03/24.  PT Short Term Goals Week 1:  PT Short Term Goal 1 (Week 1): Pt will complete bed mobility with CGA PT Short Term Goal 1 - Progress (Week 1): Progressing toward goal PT Short Term Goal 2 (Week 1): Pt will complete bed<>chair transfers with minA and LRAD PT Short Term Goal 2 - Progress (Week 1): Progressing toward goal PT Short Term Goal 3 (Week 1): Pt will ambulate 25ft with minA and LRAD PT Short Term Goal 3 - Progress (Week 1): Revised due to lack of progress PT Short Term Goal 4 (Week 1): Pt will complete functional outcome measure to assess falls risk PT Short Term Goal 4 - Progress (Week 1): Not met Week 2:  PT Short Term Goal 1 (Week 2): pt will perform bed to chair transfer with LRAD and min A PT Short Term Goal 2 (Week 2): pt will perform sit to stand with LRAD and min A PT Short Term Goal 3 (Week 2): pt will perform bed mobility with LRAD and CGA  Skilled Therapeutic Interventions/Progress Updates:      Therapy Documentation Precautions:  Precautions Precautions: Fall Recall of Precautions/Restrictions: Impaired Precaution/Restrictions Comments: L homonymous hemianopsia, L hemi, B TKA's pending revision per family Restrictions Weight Bearing Restrictions Per Provider Order: No  Therapy/Group: Individual Therapy  [facility] Doreene Orris, [place], DPT  02/03/2024, 2:55 PM

## 2024-02-03 NOTE — Progress Notes (Signed)
 PROGRESS NOTE   Subjective/Complaints: No new complaints this morning Transferring in [place] to have shower BP improved, encouraged fruits and vegetables  ROS: Patient denies chills, headaches, shortness of breath, chest pain, abdominal pain, nausea, vomiting, dizziness      Objective:   No results found.  No results for input(s): WBC, HGB, HCT, PLT in the last 72 hours.  Recent Labs    02/01/24 0557  NA 141  K 4.1  CL 108  CO2 20*  GLUCOSE 130*  BUN 29*  CREATININE 1.36*  CALCIUM  9.2    Intake/Output Summary (Last 24 hours) at 02/03/2024 1141 Last data filed at 02/03/2024 0907 Gross per 24 hour  Intake 358 ml  Output --  Net 358 ml        Physical Exam: Vital Signs Blood pressure 137/85, pulse 71, temperature 97.9 F (36.6 C), temperature source Oral, resp. rate 16, height 5' 3 (1.6 m), weight 82.9 kg, SpO2 99%.  Constitutional: No distress . Vital signs reviewed. Transferring in [place] to have shower HEENT: NCAT, EOMI, oral membranes moist Neck: supple Cardiovascular: RRR without murmur. No JVD    Respiratory/Chest: CTA Bilaterally without wheezes or rales. Non labored GI/Abdomen: BS +, non-tender, non-distended Ext: no clubbing, cyanosis, tr edema right lower extremity, 2+ left lower extremity -chronic for many years Psych: pleasant and cooperative  Skin: Clean and intact without signs of breakdown on visible portion  PRIOR EXAMS: Neuro: Alert and oriented x 4, follows simple commands, able to name and repeat, left homonymous hemianopsia-unchanged, right gaze preference able to cross midline.  Still has a lot of difficulty with visual scanning to the left.  Left facial weakness.  Right upper and lower extremity strength appear to be intact. Left upper 4-5 and lower extremity 3-4 out of 5 more proximal and distal  Prior neuro assessment is c/w today's exam 02/03/2024.   MSK: healed TKA  incisions, no joint abnormality noted    Assessment/Plan: 1. Functional deficits which require 3+ hours per day of interdisciplinary therapy in a comprehensive inpatient rehab setting. Physiatrist is providing close team supervision and 24 hour management of active medical problems listed below. Physiatrist and rehab team continue to assess barriers to discharge/monitor patient progress toward functional and medical goals  Care Tool:  Bathing    Body parts bathed by patient: Left arm, Chest, Front perineal area, Right upper leg, Left upper leg, Face, Right arm, Abdomen, Buttocks, Right lower leg, Left lower leg   Body parts bathed by helper: Abdomen, Right arm, Buttocks, Right lower leg, Left lower leg     Bathing assist Assist Level: Supervision/Verbal cueing     Upper Body Dressing/Undressing Upper body dressing   What is the patient wearing?: Pull over shirt    Upper body assist Assist Level: Moderate Assistance - Patient 50 - 74%    Lower Body Dressing/Undressing Lower body dressing      What is the patient wearing?: Pants, Incontinence brief     Lower body assist Assist for lower body dressing: Maximal Assistance - Patient 25 - 49%     Toileting Toileting    Toileting assist Assist for toileting: Dependent - Patient 0%  Transfers Chair/bed transfer  Transfers assist     Chair/bed transfer assist level: Moderate Assistance - Patient 50 - 74%     Locomotion Ambulation   Ambulation assist      Assist level: 2 helpers (modA with w/c follow for safety) Assistive device: Other (comment) (R hand rail) Max distance: 30'   Walk 10 feet activity   Assist     Assist level: 2 helpers (modA with w/c follow for safety) Assistive device: Other (comment) (R hand rail)   Walk 50 feet activity   Assist Walk 50 feet with 2 turns activity did not occur: Safety/medical concerns         Walk 150 feet activity   Assist Walk 150 feet activity did  not occur: Safety/medical concerns         Walk 10 feet on uneven surface  activity   Assist Walk 10 feet on uneven surfaces activity did not occur: Safety/medical concerns         Wheelchair     Assist Is the patient using a wheelchair?: Yes Type of Wheelchair: Manual    Wheelchair assist level: Moderate Assistance - Patient 50 - 74% Max wheelchair distance: 8'    Wheelchair 50 feet with 2 turns activity    Assist        Assist Level: Moderate Assistance - Patient 50 - 74%   Wheelchair 150 feet activity     Assist      Assist Level: Total Assistance - Patient < 25%   Blood pressure 137/85, pulse 71, temperature 97.9 F (36.6 C), temperature source Oral, resp. rate 16, height 5' 3 (1.6 m), weight 82.9 kg, SpO2 99%.  Medical Problem List and Plan: 1. 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  Antithrombotics: -DVT/anticoagulation:  Pharmaceutical: Eliquis              -antiplatelet therapy: ECASA 3. Pain Management: tylenol  prn. Voltaren  gel qid to knee.  4. Mood/Behavior/Sleep: LCSW to follow for evaluation and support.             --Melatonin prn.              -antipsychotic agents: N/A 5. Neuropsych/cognition: This patient is capable of making decisions on her own behalf. 6. Skin/Wound Care: Routine pressure relief measures.  7. Fluids/Electrolytes/Nutrition: Monitor I/O. Check CMET in am.  8. A fib: Monitor HR TID--on metoprolol  --Eliquis  resumed 09/18, heart rate stable 9. HTN: Monitor BP TID--on amlodipine , Lotensin  and Metoprolol .  Avoid hypotension and  with long-term goal normotensive             -Fair control continue to monitor, internal medicine suggests outpatient on thiazide with benazepril  combo pill to reduce pill burden  -9/20 BP mildly elevated, continue current regimen for now would like to avoid causing hypotension  Increase toprol  XL to 62.5mg  daily    02/03/2024    6:34 AM 02/03/2024    5:42 AM 02/02/2024  8:25 PM  Vitals with BMI  Weight 182 lbs 12 oz    BMI 32.38    Systolic  137 149  Diastolic  85 81  Pulse  71 88      10. CKD: BUN/SCr 30/1.40 @ admission.  --Baseline likely 1.3-1.5 in the last few months.              --Recheck in am.              - 9/19 reorder CMP, initial lab draw hemolyzed  -9/22 BUN/Cr   elevated beyond baseline today but within her variance going back to June labs.  Jardiance  resumed 9/18 which should have helped. She is on lotensin , however. Consider holding if further trend up   -for now encourage fluids   -recheck BMET in AM  -9/23 Cr down to 1.48, around her baseline, BUN a little higher at 35, continue to encourage oral fluids  -9/25 creatinine a little higher again today at at 1.61/BUN 33, encourage oral fluids, will hold Jardiance  until recheck tomorrow.  Not a lot of fluid intake recorded.  Discussed encouraging fluids with nursing  -9/26 creatinine/BUN much improved today to 1.36/29.  Continue oral fluid intake.  Start Jardiance  11. T2DM: Hgb A1c- 6.5 and well controlled. Was on Semaglutide  1 mg, Amaryl  1 mg and Jardiance  25 mg PTA             --Jardiance  resumed on 09/18 --Monitor BS ac/hs and use SSI for elevated BS -9/19-22  fair control continue to monitor trend  9/23 controlled, discussed with pharmacy will DC sliding scale and change CBGs to twice a day -9/24 controlled overall, decrease CBG to daily -9/25 controlled continue to monitor daily for now CBG stable today consider discontinue CBGs CBG (last 3)  Recent Labs    02/01/24 0556 02/02/24 0532 02/03/24 0529  GLUCAP  119* 153* 120*      12. 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  13. Scoliosis/Spondylolisthesis: Has hx of chronic LBP w/BLE weakness --also endstage OA right knee--continue Diclofenac  gel qid  14. Hyperthyroid: continue Tapazole  5 mg daily.   15. Hyperlipidemia: LDL 123 w/goal < 70.  --continue Crestor  20 mg (was increased) and Zetia .   16. Constipation             Last BM 9/27, will decrease miralax  back to daily          LOS: 10 days A FACE TO FACE EVALUATION WAS PERFORMED  Sven SQUIBB Knox Holdman 02/03/2024, 11:41 AM

## 2024-02-04 LAB — BASIC METABOLIC PANEL WITH GFR
Anion gap: 12 (ref 5–15)
BUN: 35 mg/dL — ABNORMAL HIGH (ref 8–23)
CO2: 21 mmol/L — ABNORMAL LOW (ref 22–32)
Calcium: 9.5 mg/dL (ref 8.9–10.3)
Chloride: 108 mmol/L (ref 98–111)
Creatinine, Ser: 1.82 mg/dL — ABNORMAL HIGH (ref 0.44–1.00)
GFR, Estimated: 29 mL/min — ABNORMAL LOW (ref >60)
Glucose, Bld: 152 mg/dL — ABNORMAL HIGH (ref 70–99)
Potassium: 4.2 mmol/L (ref 3.5–5.1)
Sodium: 141 mmol/L (ref 135–145)

## 2024-02-04 LAB — GLUCOSE, CAPILLARY: Glucose-Capillary: 127 mg/dL — ABNORMAL HIGH (ref 70–99)

## 2024-02-04 LAB — CBC
HCT: 40.6 % (ref 36.0–46.0)
Hemoglobin: 13 g/dL (ref 12.0–15.0)
MCH: 28.4 pg (ref 26.0–34.0)
MCHC: 32 g/dL (ref 30.0–36.0)
MCV: 88.8 fL (ref 80.0–100.0)
Platelets: 229 K/uL (ref 150–400)
RBC: 4.57 MIL/uL (ref 3.87–5.11)
RDW: 14.5 % (ref 11.5–15.5)
WBC: 5.3 K/uL (ref 4.0–10.5)
nRBC: 0 % (ref 0.0–0.2)

## 2024-02-04 MED ORDER — SODIUM CHLORIDE 0.9 % IV SOLN: INTRAVENOUS | Status: DC

## 2024-02-04 NOTE — Progress Notes (Signed)
 Physical Therapy Session Note  Patient Details  Name: Megan Fischer MRN: [id number] Date of Birth: 1951-01-08  Today's Date: 02/04/2024 PT Individual Time: 0900-0928 PT Individual Time Calculation (min): 28 min   Short Term Goals: Week 2:  PT Short Term Goal 1 (Week 2): pt will perform bed to chair transfer with LRAD and min A PT Short Term Goal 2 (Week 2): pt will perform sit to stand with LRAD and min A PT Short Term Goal 3 (Week 2): pt will perform bed mobility with LRAD and CGA  Skilled Therapeutic Interventions/Progress Updates:      Pt presents sitting in wheelchair - agreeable to thearpy treatment. She has no reports of pain. She was able to remove B socks without assist but cues for safety awareness. Donned tennis shoes with totalA.   Pt transported at w/c level to main rehab hallway and setup with R hand rail. Pt able to completed sit<>stand with minA from w/c while pulling to stand with RUE on rail.   Pt completed gait training 50ft + 68ft (seated rest break) with minA while using R hand rail. She's able to manage his LLE throughout gait cycle, locks in hyperextension and has her L hip externally rotated. MinA primarily for stability and balance. The 1st 54ft she had both UE on R hand rail and the 2nd 71ft she only used R hand on rail.   Pt returned to her room and was left sitting up in w/c with seat belt alarm on at end of treatment. Call bell and house phone within reach.   Therapy Documentation Precautions:  Precautions Precautions: Fall Recall of Precautions/Restrictions: Impaired Precaution/Restrictions Comments: L homonymous hemianopsia, L hemi, B TKA's pending revision per family Restrictions Weight Bearing Restrictions Per Provider Order: No General:     Therapy/Group: Individual Therapy  Sherlean SHAUNNA Perks 02/04/2024, 7:52 AM

## 2024-02-04 NOTE — Progress Notes (Addendum)
 PROGRESS NOTE   Subjective/Complaints: No new complaints or concerns this morning.  Working with therapy in the gym.  Creatinine level higher today.  ROS: Patient denies fevers, headaches, shortness of breath, chest pain, abdominal pain, nausea, vomiting, dizziness.  Denies new motor or sensory changes      Objective:   No results found.  Recent Labs    02/04/24 0547  WBC 5.3  HGB 13.0  HCT 40.6  PLT 229    Recent Labs    02/04/24 0547  NA 141  K 4.2  CL 108  CO2 21*  GLUCOSE 152*  BUN 35*  CREATININE 1.82*  CALCIUM  9.5    Intake/Output Summary (Last 24 hours) at 02/04/2024 1310 Last data filed at 02/04/2024 0900 Gross per 24 hour  Intake 238 ml  Output --  Net 238 ml        Physical Exam: Vital Signs Blood pressure (!) 118/59, pulse 79, temperature 97.9 F (36.6 C), temperature source Oral, resp. rate 16, height 5' 3 (1.6 m), weight 82.8 kg, SpO2 97%.  Constitutional: No distress . Vital signs reviewed.  Working with therapy in gym HEENT: NCAT, EOMI, oral membranes dry Neck: supple Cardiovascular: RRR without murmur. No JVD    Respiratory/Chest: CTA Bilaterally without wheezes or rales. Non labored GI/Abdomen: BS +, non-tender, non-distended Ext: no clubbing, cyanosis, tr edema right lower extremity, 2+ left lower extremity -chronic for many years Psych: pleasant and cooperative, jovial Skin: Clean and intact without signs of breakdown on visible portion  PRIOR EXAMS: Neuro: Alert and oriented x 4, follows simple commands, able to name and repeat, left homonymous hemianopsia-unchanged, right gaze preference able to cross midline.  Still has a lot of difficulty with visual scanning to the left.  Left facial weakness.  Right upper and lower extremity strength appear to be intact. Left upper 4-5 and lower extremity 3-4 out of 5 more proximal and distal  Prior neuro assessment is c/w today's exam  02/04/2024.   MSK: healed TKA incisions, no joint abnormality noted    Assessment/Plan: 1. Functional deficits which require 3+ hours per day of interdisciplinary therapy in a comprehensive inpatient rehab setting. Physiatrist is providing close team supervision and 24 hour management of active medical problems listed below. Physiatrist and rehab team continue to assess barriers to discharge/monitor patient progress toward functional and medical goals  Care Tool:  Bathing    Body parts bathed by patient: Left arm, Chest, Front perineal area, Right upper leg, Left upper leg, Face, Right arm, Abdomen, Buttocks, Right lower leg, Left lower leg   Body parts bathed by helper: Buttocks, Left lower leg, Right lower leg     Bathing assist Assist Level: Minimal Assistance - Patient > 75%     Upper Body Dressing/Undressing Upper body dressing   What is the patient wearing?: Pull over shirt    Upper body assist Assist Level: Minimal Assistance - Patient > 75%    Lower Body Dressing/Undressing Lower body dressing      What is the patient wearing?: Pants, Incontinence brief     Lower body assist Assist for lower body dressing: Maximal Assistance - Patient 25 - 49%  Toileting Toileting    Toileting assist Assist for toileting: Total Assistance - Patient < 25%     Transfers Chair/bed transfer  Transfers assist     Chair/bed transfer assist level: Moderate Assistance - Patient 50 - 74%     Locomotion Ambulation   Ambulation assist      Assist level: Minimal Assistance - Patient > 75% Assistive device: Other (comment) (R hand rali) Max distance: 30'   Walk 10 feet activity   Assist     Assist level: Minimal Assistance - Patient > 75% Assistive device: Other (comment) (R hand rail)   Walk 50 feet activity   Assist Walk 50 feet with 2 turns activity did not occur: Safety/medical concerns         Walk 150 feet activity   Assist Walk 150 feet  activity did not occur: Safety/medical concerns         Walk 10 feet on uneven surface  activity   Assist Walk 10 feet on uneven surfaces activity did not occur: Safety/medical concerns         Wheelchair     Assist Is the patient using a wheelchair?: Yes Type of Wheelchair: Manual    Wheelchair assist level: Moderate Assistance - Patient 50 - 74% Max wheelchair distance: 56'    Wheelchair 50 feet with 2 turns activity    Assist        Assist Level: Moderate Assistance - Patient 50 - 74%   Wheelchair 150 feet activity     Assist      Assist Level: Total Assistance - Patient < 25%   Blood pressure (!) 118/59, pulse 79, temperature 97.9 F (36.6 C), temperature source Oral, resp. rate 16, height 5' 3 (1.6 m), weight 82.8 kg, SpO2 97%.  Medical Problem List and Plan: 1. 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  Antithrombotics: -DVT/anticoagulation:  Pharmaceutical: Eliquis              -antiplatelet therapy: ECASA 3. Pain Management: tylenol  prn. Voltaren  gel qid to knee.  4. Mood/Behavior/Sleep: LCSW to follow for evaluation and support.             --Melatonin prn.              -antipsychotic agents: N/A 5. Neuropsych/cognition: This patient is capable of making decisions on her own behalf. 6. Skin/Wound Care: Routine pressure relief measures.  7. Fluids/Electrolytes/Nutrition: Monitor I/O. Check CMET in am.  8. A fib: Monitor HR TID--on metoprolol  --Eliquis  resumed 09/18, heart rate stable 9. HTN: Monitor BP TID--on amlodipine , Lotensin  and Metoprolol .   Avoid hypotension and with long-term goal normotensive             -Fair control continue to monitor, internal medicine suggests outpatient on thiazide with benazepril  combo pill to reduce pill burden  -9/20 BP mildly elevated, continue current regimen for now would like to avoid causing hypotension  Increase toprol  XL to 62.5mg   daily  -9/29 BP stable continue current regimen and monitor    02/04/2024    4:30 AM 02/04/2024    2:58 AM 02/03/2024    7:43 PM  Vitals with BMI  Weight 182 lbs 9 oz    BMI 32.34    Systolic  118 130  Diastolic  59 82  Pulse  79 79      10. CKD: BUN/SCr 30/1.40 @ admission.  --Baseline likely 1.3-1.5 in the last few months.              --Recheck in am.              - 9/19 reorder CMP, initial lab draw hemolyzed  -9/22 BUN/Cr   elevated beyond baseline today but within her variance going back to June labs.  Jardiance  resumed 9/18 which should have helped. She is on lotensin , however. Consider holding if further trend up   -for now encourage fluids   -recheck BMET in AM  -9/23 Cr down to 1.48, around her baseline, BUN a little higher at 35, continue to encourage oral fluids  -9/25 creatinine a little higher again today at at 1.61/BUN 33, encourage oral fluids, will hold Jardiance  until recheck tomorrow.  Not a lot of fluid intake recorded.  Discussed encouraging fluids with nursing  -9/26 creatinine/BUN much improved today to 1.36/29.  Continue oral fluid intake.  Start Jardiance   -9/29 creatinine up to 1.82, stop Jardiance . NS 50ml/hr for mild AKI  Addendum refusing IVF, encourage oral fluids 11. T2DM: Hgb A1c- 6.5 and well controlled. Was on Semaglutide  1 mg, Amaryl  1 mg and Jardiance  25 mg PTA             --Jardiance  resumed on 09/18 --Monitor BS ac/hs and use SSI for elevated BS -9/19-22  fair control continue to monitor trend  9/23 controlled, discussed with pharmacy will DC sliding scale and change CBGs to twice a day -9/24 controlled overall, decrease  CBG to daily -9/25 controlled continue to monitor daily for now CBG stable today consider discontinue CBGs Discontinue CBGs CBG (last 3)  Recent Labs    02/02/24 0532 02/03/24 0529 02/04/24 0548  GLUCAP 153* 120* 127*      12. 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  13. Scoliosis/Spondylolisthesis: Has hx of chronic LBP w/BLE weakness --also endstage OA right knee--continue Diclofenac  gel qid  14. Hyperthyroid: continue Tapazole  5 mg daily.   15. Hyperlipidemia: LDL 123 w/goal < 70.  --continue Crestor  20 mg (was increased) and Zetia .   16. Constipation             Last BM 9/27, will decrease miralax  back to daily  9/29 continue current regimen and consider additional medication tomorrow if no BM        LOS: 11 days A FACE TO FACE EVALUATION WAS PERFORMED  Murray Collier 02/04/2024, 1:10 PM

## 2024-02-04 NOTE — Progress Notes (Signed)
 Encouraged oral fluids, fluids given.   Geni Armor, LPN

## 2024-02-04 NOTE — Progress Notes (Incomplete)
 Speech Language Pathology Daily Session Note  Patient Details  Name: Megan Fischer MRN: [id number] Date of Birth: January 26, 1951  Today's Date: 02/04/2024 SLP Individual Time: 1003-1101 SLP Individual Time Calculation (min): 58 min  Short Term Goals: Week 2: SLP Short Term Goal 1 (Week 2): Patient will attend to the L during functional therapy tasks to identify L presenting task components in 4/5 opportunities given min assist. SLP Short Term Goal 2 (Week 2): Patient will recall and manipulate functional information during problem solving tasks in 80% of trials given min assist. SLP Short Term Goal 3 (Week 2): Patient will solve functional problems with 80% accuracy given overall min assist.  Skilled Therapeutic Interventions:     Pain    Therapy/Group: Individual Therapy  [name] 02/04/2024, 2:30 PM

## 2024-02-04 NOTE — Progress Notes (Signed)
 Occupational Therapy Session Note  Patient Details  Name: Megan Fischer MRN: [id number] Date of Birth: Sep 30, 1950  Today's Date: 02/04/2024 OT Individual Time: 9199-9142 OT Individual Time Calculation (min): 57 min    Short Term Goals: Week 2:  OT Short Term Goal 1 (Week 2): Patient to perform upper body dressing with Min assist OT Short Term Goal 2 (Week 2): Patient to perform shower seated with Mod assist and AE as needed OT Short Term Goal 3 (Week 2): Patient to perform shower transfer with Mod assist of one.  Skilled Therapeutic Interventions/Progress Updates: Patient received sitting up in bed finishing breakfast. Agreeable to OT treatment. Patient motivated to get OOB and in the shower. See below for ADL levels. Patient doing well with standing clothing management stand pivot to the w/c and improved use of hemi dressing. Continue with skilled OT POC for improved independence with ADLs     Therapy Documentation Precautions:  Precautions Precautions: Fall Recall of Precautions/Restrictions: Impaired Precaution/Restrictions Comments: L homonymous hemianopsia, L hemi, B TKA's pending revision per family Restrictions Weight Bearing Restrictions Per Provider Order: No General:   Vital Signs:   Pain: Pain Assessment Pain Scale: 0-10 Pain Score: 5  Pain Location: Head Pain Descriptors / Indicators: Aching Pain Onset: Gradual Pain Intervention(s): Medication (See eMAR) ADL: ADL Eating: Set up Where Assessed-Eating: Bed level Grooming: Setup Where Assessed-Grooming: Sitting at sink Upper Body Bathing: SBA Where Assessed-Upper Body Bathing: shower Lower Body Bathing: Min assistance Where Assessed-Lower Body Bathing:shower Upper Body Dressing: set up Where Assessed-Upper Body Dressing: Edge of bed Lower Body Dressing: Max  Where Assessed-Lower Body Dressing: Edge of bed Toileting: Dependent Where Assessed-Toileting: Teacher, adult education: Min with stedy sit to  Pharmacist, hospital Method: [employer]: Extra wide drop arm bedside commode, Grab bars Film/video editor: Min assist to stand with [employer] Transfer Method: STEDY with tub bench    Therapy/Group: Individual Therapy  Megan Fischer 02/04/2024, 12:10 PM

## 2024-02-04 NOTE — Progress Notes (Signed)
 Encouraged oral fluids, fluids taken.   Geni Armor, LPN

## 2024-02-04 NOTE — Progress Notes (Signed)
 Physical Therapy Session Note  Patient Details  Name: Megan Fischer MRN: [id number] Date of Birth: Jan 14, 1951  Today's Date: 02/04/2024 PT Individual Time: 1300-1355 PT Individual Time Calculation (min): 55 min   Short Term Goals: Week 2:  PT Short Term Goal 1 (Week 2): pt will perform bed to chair transfer with LRAD and min A PT Short Term Goal 2 (Week 2): pt will perform sit to stand with LRAD and min A PT Short Term Goal 3 (Week 2): pt will perform bed mobility with LRAD and CGA  Skilled Therapeutic Interventions/Progress Updates:      Pt seated in WC upon arrival. Pt agreeable to therapy. Pt denies any pain.   Pt ambulated 1x12,1x15,1x72 feet with RW and min A overall for safety/stability with RW. Pt reqired verbal cues for attention to L UE and L LE with emphasis on hand placement and correction of L LE ER especially with fatigue.   Pt demonstrates improved carry over with ambulation WC to BSC over toilet in bathroom with RW and min A. Pt required total A for management of pants and brief for time and energy conservation. Pt continent of bladder.   Utilized steady fro transfer back to bed for time/energy conservation.   Sit to supine with CGA, verbal cues provided for safety and awarness of where pt is in space to prevent pt from sliding off of L side of bed.   Pt supine in bed with all needs within reach and bed alarm on.   Therapy Documentation Precautions:  Precautions Precautions: Fall Recall of Precautions/Restrictions: Impaired Precaution/Restrictions Comments: L homonymous hemianopsia, L hemi, B TKA's pending revision per family Restrictions Weight Bearing Restrictions Per Provider Order: No  Therapy/Group: Individual Therapy  [facility] Bernice, [place], DPT  02/04/2024, 7:25 AM

## 2024-02-04 NOTE — Progress Notes (Signed)
 Speech Language Pathology Daily Session Note  Patient Details  Name: Megan Fischer MRN: [id number] Date of Birth: [birth year]  Today's Date: 02/04/2024 SLP Individual Time:  -     Short Term Goals: Week 2: SLP Short Term Goal 1 (Week 2): Patient will attend to the L during functional therapy tasks to identify L presenting task components in 4/5 opportunities given min assist. SLP Short Term Goal 2 (Week 2): Patient will recall and manipulate functional information during problem solving tasks in 80% of trials given min assist. SLP Short Term Goal 3 (Week 2): Patient will solve functional problems with 80% accuracy given overall min assist.  Skilled Therapeutic Interventions:  Patient was seen in am to address cognitive re- training. Pt was alert and seated upright in WC upon SLP arrival. She was agreeable with hesitancy for session. SLP introduced 'BE FAST' stroke symptoms and challenged pt in recall. After a distracted delay of ~25 minutes she recalled stroke symptoms given min A. In other minutes of session, SLP challenging pt in left attention in various tasks. Pt initially challenged in identifying information in left visual field through interpretation of a TV guide schedule. Pt completing task with 100% acc though warranting min A and additional time. SLP also challenging pt outside of room where she located handouts on a board for nutrition information and further identified specified information given min to mod A. At conclusion of session, pt was left upright in [facility] with chair alarm active and call button within reach. SLP to continue POC.   Pain Pain Assessment Pain Scale: 0-10 Pain Score: 0-No pain  Therapy/Group: Individual Therapy  [name] 02/04/2024, 12:06 PM

## 2024-02-05 LAB — BASIC METABOLIC PANEL WITH GFR
Anion gap: 14 (ref 5–15)
BUN: 32 mg/dL — ABNORMAL HIGH (ref 8–23)
CO2: 17 mmol/L — ABNORMAL LOW (ref 22–32)
Calcium: 9.1 mg/dL (ref 8.9–10.3)
Chloride: 105 mmol/L (ref 98–111)
Creatinine, Ser: 1.28 mg/dL — ABNORMAL HIGH (ref 0.44–1.00)
GFR, Estimated: 45 mL/min — ABNORMAL LOW (ref >60)
Glucose, Bld: 109 mg/dL — ABNORMAL HIGH (ref 70–99)
Potassium: 3.8 mmol/L (ref 3.5–5.1)
Sodium: 136 mmol/L (ref 135–145)

## 2024-02-05 NOTE — Progress Notes (Signed)
 Occupational Therapy Session Note  Patient Details  Name: Megan Fischer MRN: [id number] Date of Birth: Nov 29, 1950  Today's Date: 02/05/2024 OT Individual Time: 0902-0959 OT Individual Time Calculation (min): 57 min    Short Term Goals: Week 2:  OT Short Term Goal 1 (Week 2): Patient to perform upper body dressing with Min assist OT Short Term Goal 2 (Week 2): Patient to perform shower seated with Mod assist and AE as needed OT Short Term Goal 3 (Week 2): Patient to perform shower transfer with Mod assist of one.  Skilled Therapeutic Interventions/Progress Updates: Patient see for am ADL's. Patient received awake and on the phone in bed. Agreeable to getting up with therapy. Patient transitioned from supine to EOB with CGA. Transfer to w/c from EOB with Min assist using rw. Sat at the sink for bathing and grooming tasks. Needed assist to wash LE's, but would do well with a long handled sponge. Continued to work on upper body dressing and did well putting on hospital scrub shirt after assist orienting the back and front. Patient needing cues for safety and left side attention, but has improved with left side awareness. Continue with functional activities and coloring tasks to improve L side attention.  Continue with work on Chiropodist transfer - patient reports the land lord will put in a walk in shower, but recommend planning for the tub bench over the side of the tub incase the remodel is not completed quickly.     Therapy Documentation Precautions:  Precautions Precautions: Fall Recall of Precautions/Restrictions: Impaired Precaution/Restrictions Comments: L homonymous hemianopsia, L hemi, B TKA's pending revision per family Restrictions Weight Bearing Restrictions Per Provider Order: No General:   Vital Signs:   Pain: Pain Assessment Pain Scale: 0-10 Pain Score: 0-No pain ADL: ADL Eating: Set up Where Assessed-Eating: Bed level Grooming: Setup Where Assessed-Grooming: Sitting  at sink Upper Body Bathing: set up assistance Where Assessed-Upper Body Bathing:sink Lower Body Bathing: Mod assistance Where Assessed-Lower Body Bathing: sink Upper Body Dressing: set up assistance Where Assessed-Upper Body Dressing: Edge of bed Lower Body Dressing: Max Assist Where Assessed-Lower Body Dressing: Edge of bed Toileting: Max assist Where Assessed-Toileting: Teacher, adult education: Mod Assist Toilet Transfer Method: Ambulance person: Extra wide drop arm bedside commode, Grab bars Film/video editor: Mod Assist Film/video editor Method: STEDY     Therapy/Group: Individual Therapy  Megan Fischer 02/05/2024, 12:13 PM

## 2024-02-05 NOTE — Plan of Care (Signed)
  Problem: RH BLADDER ELIMINATION Goal: RH STG MANAGE BLADDER WITH ASSISTANCE Description: STG Manage Bladder With toileting Assistance Outcome: Progressing   Problem: RH SAFETY Goal: RH STG ADHERE TO SAFETY PRECAUTIONS W/ASSISTANCE/DEVICE Description: STG Adhere to Safety Precautions With cues Assistance/Device. Outcome: Progressing   Problem: RH PAIN MANAGEMENT Goal: RH STG PAIN MANAGED AT OR BELOW PT'S PAIN GOAL Description: < 4 with prns Outcome: Progressing

## 2024-02-05 NOTE — Progress Notes (Signed)
 Physical Therapy Session Note  Patient Details  Name: Megan Fischer MRN: [id number] Date of Birth: [birth year]  Today's Date: 02/05/2024 PT Individual Time: [id number] PT Individual Time Calculation (min): 45 min   Short Term Goals: Week 2:  PT Short Term Goal 1 (Week 2): pt will perform bed to chair transfer with LRAD and min A PT Short Term Goal 2 (Week 2): pt will perform sit to stand with LRAD and min A PT Short Term Goal 3 (Week 2): pt will perform bed mobility with LRAD and CGA  Skilled Therapeutic Interventions/Progress Updates:      Pt with tech on [facility] over toilet upon arrival. Pt took over with toileting. Pt agreeable to therapy. Pt denies any pain.   Sit to stand with RW and min-mod A (with fatigue). Pt required max A to donn pants over buttocks while standing with RW and CGA/min A.   Pt ambulated bathroom to WC with RW and +2 mod A (pt overall more fatigued today and requried increased cues for attention to task and redirection--possibly due to family in the room).   Pt son arrived during session. Education provided regarding custom light weight WC consult scheduled with nu motion 10/7 at 1 pm. Education provided for purpose of and justification regarding this. Pt son very appreciative of this and expressed many safety concerns with pt walking at home 2/2 history of numerous falls and reduced safety at baseline.   Followed up with pt regarding car. Pt son endorses they have a mini fleeta that has ramp entry and tie downs for [facility] however does not know how to operate it at this time. Scheduled family training for transfer to pt person vehicle 10/2 at 1 pm.   Pt son requesting to be checked off in the room to assist pt with transfers and toileting while he is here in the evenings. Pt and pt son returned demonstration of STEADY transfer WC<>bed and WC to [facility] over toilet. Pt son checked off to perform this transfer. Notified nurse.   Pt supine in bed with all needs within reach and bed  alarm on.   Therapy Documentation Precautions:  Precautions Precautions: Fall Recall of Precautions/Restrictions: Impaired Precaution/Restrictions Comments: L homonymous hemianopsia, L hemi, B TKA's pending revision per family Restrictions Weight Bearing Restrictions Per Provider Order: No  Therapy/Group: Individual Therapy  [facility] [place], [place], DPT  02/05/2024, 8:00 AM

## 2024-02-05 NOTE — Progress Notes (Signed)
 Speech Language Pathology Daily Session Note  Patient Details  Name: Megan Fischer MRN: [id number] Date of Birth: 08/05/1950  Today's Date: 02/05/2024 SLP Individual Time: 1100-1200 SLP Individual Time Calculation (min): 60 min  Short Term Goals: Week 2: SLP Short Term Goal 1 (Week 2): Patient will attend to the L during functional therapy tasks to identify L presenting task components in 4/5 opportunities given min assist. SLP Short Term Goal 2 (Week 2): Patient will recall and manipulate functional information during problem solving tasks in 80% of trials given min assist. SLP Short Term Goal 3 (Week 2): Patient will solve functional problems with 80% accuracy given overall min assist.  Skilled Therapeutic Interventions: SLP conducted skilled therapy session targeting cognitive goals. During basic map interpretation task, patient benefited from supervision cues to interpret verbally presented prompts and min to mod assist to attend to all visually presented information. During coupon scanning and information processing task, patient benefited from supervision to min cues for L attention and min cues for thoroughness and succinctness. Sustained attention improved compared to previous session, though patient remains self-distracting and tangential benefiting from min cues throughout to stick to each prompt when providing answers. Patient was left in room with call bell in reach and alarm set. SLP will continue to target goals per plan of care.        Pain Pain Assessment Pain Scale: 0-10 Pain Score: 0-No pain  Therapy/Group: Individual Therapy  Alletta Mattos, M.A., CCC-SLP  Arnell Mausolf A Nasiya Pascual 02/05/2024, 12:58 PM

## 2024-02-05 NOTE — Progress Notes (Signed)
 Physical Therapy Session Note  Patient Details  Name: Megan Fischer MRN: [id number] Date of Birth: 03-04-1951  Today's Date: 02/05/2024 PT Individual Time: 1335-1415 PT Individual Time Calculation (min): 40 min   Short Term Goals: Week 2:  PT Short Term Goal 1 (Week 2): pt will perform bed to chair transfer with LRAD and min A PT Short Term Goal 2 (Week 2): pt will perform sit to stand with LRAD and min A PT Short Term Goal 3 (Week 2): pt will perform bed mobility with LRAD and CGA  Skilled Therapeutic Interventions/Progress Updates: Patient semi-reclined in bed on entrance to room. Patient alert and agreeable to PT session.   Patient reported no pain during session, only fatigue  Therapeutic Activity: Bed Mobility: Pt performed supine<sit on EOB with close supervision and HOB elevated. VC required for anterior scoot to edge. Pt sat EOB and donned personal shoes with supervision and increased time. Transfers: Pt performed sit<>stand transfer from EOB<WC with modA to stand (elevated bed) with 2 attempts required due to first stand pt's L leg in genu valgus that required sit down to reset. Pt presented with L genu recurvatum during transfer with pt required minA to pivot in RW to WC.  Neuromuscular Re-ed: NMR facilitated during session with focus on neuromuscular control of L LE/coordination. Rest breaks required due to fatigue - Eccentric control of L LAQ with maxA required to control lower when starting at full extension - Eccentric control of L hip flexors sitting in chair with VC to avoid holding breath. Min resistance of L hip flexors (manual into hip extension) with rest break required - Dorsiflexion + eversion of L ankle with target cues to reach PTA hand. Pt with decreased coordination of eversion but able to obtain following cue  NMR performed for improvements in motor control and coordination, balance, sequencing, judgement, and self confidence/ efficacy in performing all aspects  of mobility at highest level of independence.   Patient sitting in WC at end of session with brakes locked, belt alarm set, and all needs within reach.      Therapy Documentation Precautions:  Precautions Precautions: Fall Recall of Precautions/Restrictions: Impaired Precaution/Restrictions Comments: L homonymous hemianopsia, L hemi, B TKA's pending revision per family Restrictions Weight Bearing Restrictions Per Provider Order: No  Therapy/Group: Individual Therapy  Ashunti Schofield PTA 02/05/2024, 4:16 PM

## 2024-02-05 NOTE — Progress Notes (Addendum)
 PROGRESS NOTE   Subjective/Complaints: No acute events noted overnight.  Nursing was encouraging oral fluids as patient did not want IVF.  Creatinine much improved today to 1.28!  ROS: Patient denies chills, headache, abdominal pain, nausea, vomiting, dizziness, chest pain, shortness of breath      Objective:   No results found.  Recent Labs    02/04/24 0547  WBC 5.3  HGB 13.0  HCT 40.6  PLT 229    Recent Labs    02/04/24 0547 02/05/24 0522  NA 141 136  K 4.2 3.8  CL 108 105  CO2 21* 17*  GLUCOSE 152* 109*  BUN 35* 32*  CREATININE 1.82* 1.28*  CALCIUM  9.5 9.1    Intake/Output Summary (Last 24 hours) at 02/05/2024 1050 Last data filed at 02/05/2024 0645 Gross per 24 hour  Intake 960 ml  Output --  Net 960 ml        Physical Exam: Vital Signs Blood pressure 137/68, pulse 80, temperature 98 F (36.7 C), temperature source Oral, resp. rate 18, height 5' 3 (1.6 m), weight 82 kg, SpO2 95%.  Constitutional: No distress . Vital signs reviewed.  Appears comfortable laying in bed HEENT: NCAT, EOMI, oral membranes moist Neck: supple Cardiovascular: RRR without murmur. No JVD    Respiratory/Chest: CTA Bilaterally without wheezes or rales. Non labored GI/Abdomen: BS +, non-tender, non-distended Ext: no clubbing, cyanosis, tr edema right lower extremity, 2+ left lower extremity -chronic for many years Psych: pleasant and cooperative Skin: Clean and intact without signs of breakdown on visible portion  PRIOR EXAMS: Neuro: Alert and oriented x 4, follows simple commands, able to name and repeat, left homonymous hemianopsia-unchanged, right gaze preference able to cross midline.  Still has a lot of difficulty with visual scanning to the left.  Left facial weakness.  Right upper and lower extremity strength appear to be intact. Left upper 4-5 and lower extremity 3-4 out of 5 more proximal and distal  Prior neuro  assessment is c/w today's exam 02/05/2024.   MSK: healed TKA incisions, no joint abnormality noted    Assessment/Plan: 1. Functional deficits which require 3+ hours per day of interdisciplinary therapy in a comprehensive inpatient rehab setting. Physiatrist is providing close team supervision and 24 hour management of active medical problems listed below. Physiatrist and rehab team continue to assess barriers to discharge/monitor patient progress toward functional and medical goals  Care Tool:  Bathing    Body parts bathed by patient: Left arm, Chest, Front perineal area, Right upper leg, Left upper leg, Face, Right arm, Abdomen, Buttocks, Right lower leg, Left lower leg   Body parts bathed by helper: Buttocks, Left lower leg, Right lower leg     Bathing assist Assist Level: Minimal Assistance - Patient > 75%     Upper Body Dressing/Undressing Upper body dressing   What is the patient wearing?: Pull over shirt    Upper body assist Assist Level: Minimal Assistance - Patient > 75%    Lower Body Dressing/Undressing Lower body dressing      What is the patient wearing?: Pants, Incontinence brief     Lower body assist Assist for lower body dressing: Maximal Assistance -  Patient 25 - 49%     Toileting Toileting    Toileting assist Assist for toileting: Total Assistance - Patient < 25%     Transfers Chair/bed transfer  Transfers assist     Chair/bed transfer assist level: Moderate Assistance - Patient 50 - 74%     Locomotion Ambulation   Ambulation assist      Assist level: 2 helpers (+2 for WC follow) Assistive device: Walker-rolling Max distance: 72   Walk 10 feet activity   Assist     Assist level: 2 helpers (+2 for WC follow) Assistive device: Walker-rolling   Walk 50 feet activity   Assist Walk 50 feet with 2 turns activity did not occur: Safety/medical concerns  Assist level: 2 helpers (+2 for WC follow) Assistive device: Walker-rolling     Walk 150 feet activity   Assist Walk 150 feet activity did not occur: Safety/medical concerns (fatigue)         Walk 10 feet on uneven surface  activity   Assist Walk 10 feet on uneven surfaces activity did not occur: Safety/medical concerns         Wheelchair     Assist Is the patient using a wheelchair?: Yes Type of Wheelchair: Manual    Wheelchair assist level: Moderate Assistance - Patient 50 - 74% Max wheelchair distance: 55'    Wheelchair 50 feet with 2 turns activity    Assist        Assist Level: Moderate Assistance - Patient 50 - 74%   Wheelchair 150 feet activity     Assist      Assist Level: Total Assistance - Patient < 25%   Blood pressure 137/68, pulse 80, temperature 98 F (36.7 C), temperature source Oral, resp. rate 18, height 5' 3 (1.6 m), weight 82 kg, SpO2 95%.  Medical Problem List and Plan: 1. 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  Antithrombotics: -DVT/anticoagulation:  Pharmaceutical: Eliquis              -antiplatelet therapy: ECASA 3. Pain Management: tylenol  prn. Voltaren  gel qid to knee.  4. Mood/Behavior/Sleep: LCSW to follow for evaluation and support.             --Melatonin prn.              -antipsychotic agents: N/A 5. Neuropsych/cognition: This patient is capable of making decisions on her own behalf. 6. Skin/Wound Care: Routine pressure relief measures.  7. Fluids/Electrolytes/Nutrition:  Monitor I/O. Check CMET in am.  8. A fib: Monitor HR TID--on metoprolol  --Eliquis  resumed 09/18, heart rate stable 9. HTN: Monitor BP TID--on amlodipine , Lotensin  and Metoprolol .  Avoid hypotension and with long-term goal normotensive             -Fair control continue to monitor, internal medicine suggests outpatient on thiazide with benazepril   combo pill to reduce pill burden  -9/20 BP mildly elevated, continue current regimen for now would like to avoid causing hypotension  Increase toprol  XL to 62.5mg  daily  -9/30 intermittent elevation but overall controlled continue to monitor    02/05/2024    5:05 AM 02/05/2024    5:00 AM 02/04/2024    7:52 PM  Vitals with BMI  Weight  180 lbs 12 oz   BMI  32.03   Systolic 137  144  Diastolic 68  76  Pulse 80  75      10. CKD: BUN/SCr 30/1.40 @ admission.  --Baseline likely 1.3-1.5 in the last few months.              --Recheck in am.              - 9/19 reorder CMP, initial lab draw hemolyzed  -9/22 BUN/Cr   elevated beyond baseline today but within her variance going back to June labs.  Jardiance  resumed 9/18 which should have helped. She is on lotensin , however. Consider holding if further trend up   -for now encourage fluids   -recheck BMET in AM  -9/23 Cr down to 1.48, around her baseline, BUN a little higher at 35, continue to encourage oral fluids  -9/25 creatinine a little higher again today at at 1.61/BUN 33, encourage oral fluids, will hold Jardiance  until recheck tomorrow.  Not a lot of fluid intake recorded.  Discussed encouraging fluids with nursing  -9/26 creatinine/BUN much improved today to 1.36/29.  Continue oral fluid intake.  Start Jardiance   -9/29 creatinine up to 1.82, stop Jardiance . NS 50ml/hr for mild AKI  Addendum refusing IVF, encourage oral fluids  -9/30 creatinine much improved to 1.28/BUN 32 after encouraging oral fluids 11. T2DM: Hgb A1c- 6.5 and well controlled. Was on Semaglutide  1 mg, Amaryl  1 mg and Jardiance   25 mg PTA             --Jardiance  resumed on 09/18 --Monitor BS ac/hs and use SSI for elevated BS -9/19-22  fair control continue to monitor trend  9/23 controlled, discussed with pharmacy will DC sliding scale and change CBGs to twice a day -9/24 controlled overall, decrease CBG to daily -9/25 controlled continue to monitor daily for now CBG stable today consider discontinue CBGs Discontinue CBGs CBG (last 3)  Recent Labs    02/03/24 0529 02/04/24 0548  GLUCAP 120* 127*      12. 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  13. Scoliosis/Spondylolisthesis: Has hx of chronic LBP w/BLE weakness --also endstage OA right knee--continue Diclofenac  gel qid  14. Hyperthyroid: continue Tapazole  5 mg daily.   15. Hyperlipidemia: LDL 123 w/goal < 70.  --continue Crestor  20 mg (was increased) and Zetia .   16. Constipation             Last BM 9/27, will decrease miralax  back to daily  9/29 continue current regimen and consider additional medication tomorrow if no BM  9/30 large bowel movement yesterday continue to monitor        LOS: 12 days A FACE TO FACE EVALUATION WAS PERFORMED  Murray Collier 02/05/2024, 10:50 AM

## 2024-02-06 DIAGNOSIS — M791 Myalgia, unspecified site: Secondary | ICD-10-CM

## 2024-02-06 MED ORDER — SORBITOL 70 % SOLN: 30.0000 mL | Freq: Every day | Status: DC | PRN

## 2024-02-06 MED ORDER — LIDOCAINE 5 % EX PTCH
2.0000 | MEDICATED_PATCH | CUTANEOUS | Status: DC
  Administered 2024-02-06 – 2024-02-15 (×9): 2 via TRANSDERMAL
  Filled 2024-02-06 (×8): qty 2

## 2024-02-06 MED ORDER — METHOCARBAMOL 500 MG PO TABS
500.0000 mg | ORAL_TABLET | Freq: Four times a day (QID) | ORAL | Status: DC | PRN
  Administered 2024-02-06 – 2024-02-14 (×5): 500 mg via ORAL
  Filled 2024-02-06 (×5): qty 1

## 2024-02-06 NOTE — Progress Notes (Signed)
 Occupational Therapy Session Note  Patient Details  Name: TEJA COSTEN MRN: [id number] Date of Birth: 08-May-1951  Today's Date: 02/06/2024 OT Individual Time: 1115-1200 OT Individual Time Calculation (min): 45 min    Short Term Goals: Week 2:  OT Short Term Goal 1 (Week 2): Patient to perform upper body dressing with Min assist OT Short Term Goal 2 (Week 2): Patient to perform shower seated with Mod assist and AE as needed OT Short Term Goal 3 (Week 2): Patient to perform shower transfer with Mod assist of one.  Skilled Therapeutic Interventions/Progress Updates: Patient received sittin up in w/c agreeable to OT treatment. Assisted patient to therapy gym for visual scanning puzzles. Patient needing 2:35 minutes to complete A-R visual scanning. Second attempt completed @ 1:28. Patient continues to test with a left side field cut, but is improving with compensatory techniques. Continued treatment with tub bench transfer training in ADL room. Patient able to return demo scooting/squat pivot transfer from w/c to extended bench. Patient needed Min assist with the scoot from w/c to bench and assist lifting each leg over the side of the tub. Patient reports having a good size bathroom that will allow the w/c to fit next to the bench. Recommend practicing transfer with family prior to discharge. Patient assisted back to room, sitting up in w/c for lunch. Call bell and other needs in reach. Continue with skilled OT POC.      Therapy Documentation Precautions:  Precautions Precautions: Fall Recall of Precautions/Restrictions: Impaired Precaution/Restrictions Comments: L homonymous hemianopsia, L hemi, B TKA's pending revision per family Restrictions Weight Bearing Restrictions Per Provider Order: No General:   Vital Signs:   Pain: Pain Assessment Pain Scale: 0-10 Pain Score:  (12) Pain Location: Shoulder Pain Intervention(s): Medication (See eMAR) ADL:  Vision continued left field cut  limiting safe mobility.     Therapy/Group: Individual Therapy  [name] 02/06/2024, 12:04 PM

## 2024-02-06 NOTE — Patient Care Conference (Signed)
 Inpatient RehabilitationTeam Conference and Plan of Care Update Date: 02/06/2024   Time: 12:06 PM    Patient Name: Megan Fischer      Medical Record Number: [id number]  Date of Birth: Dec 22, 1950 Sex: Female         Room/Bed: 4W08C/4W08C-01 Payor Info: Payor: Advertising copywriter MEDICARE / Plan: DREMA DUAL COMPLETE / Product Type: *No Product type* /    Admit Date/Time:  01/24/2024  5:26 PM  Primary Diagnosis:  Acute ischemic right PCA stroke [facility])  Hospital Problems: Principal Problem:   Acute ischemic right PCA stroke [facility]) Active Problems:   Chronic kidney disease    Expected Discharge Date: Expected Discharge Date: 02/15/24  Team Members Present: Physician leading conference: Dr. Murray Collier Social Worker Present: Other (comment) (Megan Fischer) Nurse Present: Barnie Ronde, RN PT Present: Doreene Orris, PT OT Present: Delon Sharps, OT SLP Present: [name], SLP PPS Coordinator present : Eleanor Colon, SLP     Current Status/Progress Goal Weekly Team Focus  Bowel/Bladder      Continent of bowel and bladder          Swallow/Nutrition/ Hydration               ADL's   Patient requires mod assist for lower body dressing and toileting. Improving with bathing and upper body dressing to CGA/Min assist.   Patient continues to work towards LTG of functional transfers at a Min assist level   Continue to work on functional transfers to toilet and dynamic standing balance for clothing management.    Mobility   bed mobility: superivsion with use of bed features, sit to stand/stand pivot transfer with RW and min A, max verbal cues provided for L LE positioning, attention to L UE and sequencing/technique. Pt ambulating up to 72 feet with RW and min A with +2 for WC follow. Pt reqiures +2 min-mod A in room with more obstacles/distractions.   goals: CGA/min A  D/C 10/10 however pt may benefit from increased LOS for light weight WC , Consult scheduled for  10/7 at 1 with nu motion for light weight manual WC. Family training scheduled to assist pt to car tomorrow (pt son apparently has WC accessible van.  HHPT, Pt son checked off to assist with steady transfer in the room. Pt son would benefit from further hands on training for stand pivot transfer with RW.    Communication                Safety/Cognition/ Behavioral Observations  modA for L visual attention, sustained attention, problem solving, and memory - limited to no mental flexibility (pt peprsonality?)   supervision   cogntiive re training, pt/family education, functional cognitive tasks    Pain      Shoulder pain    PAin < 4 with prns    Muscle relaxer added; Voltaren  gel reportedly does not help with pain control.  Skin      N/a           Discharge Planning:  Plans to discharge home with daughter, Mylinda providing assistance as needed (limited due to fall) - Patients son, Mohammed lives with her and able to privide care when there. Family interested in [facility]. Demographics faxed for wheelchair consultation. Awaiting therapy follow-up recommendations.   Team Discussion: Patient admitted post right PCA CVA with left inattention and fatigue. Progress limited by arthritis of right UE and hemiparesis on left side post stroke and knee limitations; surgery pending. Functional status affected by poor  proprioception. Needs cues for safety and min - mod assist for attention and memory as she is easily distracted, tangential and awareness interpretation.  Patient on target to meet rehab goals: yes, currently needs mod assist for bower body care and mod max assist for toileting. Needs min assist for ambulating, sit - stand with using a RW with max cues. Goals for discharge set for min assist overall.  *See Care Plan and progress notes for long and short-term goals.   Revisions to Treatment Plan:  Wheelchair consult   Teaching Needs: Safety, medications, transfers, toileting, etc.    Current Barriers to Discharge: Decreased caregiver support  Possible Resolutions to Barriers: Family education DME: power chair, stedy and hospital bed     Medical Summary Current Status: R PCA, CVA, HTN, Afib, CKD, T2DM, constipation  Barriers to Discharge: Hypotension;Medical stability;Self-care education;Uncontrolled Pain  Barriers to Discharge Comments: R PCA, CVA, HTN, Afib, CKD, T2DM, constipation Possible Resolutions to [employer] Focus: robaxin , lidocaine patch, monitor BMP, monitor bowel function   Continued Need for Acute Rehabilitation Level of Care: The patient requires daily medical management by a physician with specialized training in physical medicine and rehabilitation for the following reasons: Direction of a multidisciplinary physical rehabilitation program to maximize functional independence : Yes Medical management of patient stability for increased activity during participation in an intensive rehabilitation regime.: Yes Analysis of laboratory values and/or radiology reports with any subsequent need for medication adjustment and/or medical intervention. : Yes   I attest that I was present, lead the team conference, and concur with the assessment and plan of the team.   [name] 02/06/2024, 4:04 PM

## 2024-02-06 NOTE — Progress Notes (Addendum)
 PROGRESS NOTE   Subjective/Complaints: Pt reports some UE b/l and chest wall muscle soreness today. LBM 9/29 doesn't feel constipation  ROS: Patient denies chills, headache, abdominal pain, nausea, vomiting, dizziness, chest pain, shortness of breath +b/l arm soreness     Objective:   No results found.  Recent Labs    02/04/24 0547  WBC 5.3  HGB 13.0  HCT 40.6  PLT 229    Recent Labs    02/04/24 0547 02/05/24 0522  NA 141 136  K 4.2 3.8  CL 108 105  CO2 21* 17*  GLUCOSE 152* 109*  BUN 35* 32*  CREATININE 1.82* 1.28*  CALCIUM  9.5 9.1    Intake/Output Summary (Last 24 hours) at 02/06/2024 1112 Last data filed at 02/06/2024 0740 Gross per 24 hour  Intake 120 ml  Output --  Net 120 ml        Physical Exam: Vital Signs Blood pressure (!) 117/97, pulse 75, temperature 98 F (36.7 C), temperature source Oral, resp. rate 18, height 5' 3 (1.6 m), weight 82.4 kg, SpO2 100%.  Constitutional: No distress . Vital signs reviewed. Laying in bed.  HEENT: NCAT, EOMI, oral membranes moist Neck: supple Cardiovascular: RRR without murmur. No JVD    Respiratory/Chest: CTA Bilaterally without wheezes or rales. Non labored GI/Abdomen: BS +, non-tender, non-distended Ext: no clubbing, cyanosis, tr edema right lower extremity, 2+ left lower extremity -chronic for many years Psych: pleasant and cooperative Skin: Clean and intact without signs of breakdown on visible portion  PRIOR EXAMS: Neuro: Alert and oriented x 4, follows simple commands, able to name and repeat, left homonymous hemianopsia-unchanged, right gaze preference able to cross midline.  Still has a lot of difficulty with visual scanning to the left.  Left facial weakness.  Right upper and lower extremity strength appear to be intact. Left upper 4-5 and lower extremity 3-4 out of 5 more proximal and distal  Prior neuro assessment is c/w today's exam  02/06/2024.   MSK: healed TKA incisions, no joint abnormality noted TTP upper arms b/l and chest wall, pain with b/l arm ROM    Assessment/Plan: 1. Functional deficits which require 3+ hours per day of interdisciplinary therapy in a comprehensive inpatient rehab setting. Physiatrist is providing close team supervision and 24 hour management of active medical problems listed below. Physiatrist and rehab team continue to assess barriers to discharge/monitor patient progress toward functional and medical goals  Care Tool:  Bathing    Body parts bathed by patient: Left arm, Chest, Front perineal area, Right upper leg, Left upper leg, Face, Right arm, Abdomen, Buttocks, Right lower leg, Left lower leg   Body parts bathed by helper: Buttocks, Left lower leg, Right lower leg     Bathing assist Assist Level: Minimal Assistance - Patient > 75%     Upper Body Dressing/Undressing Upper body dressing   What is the patient wearing?: Pull over shirt    Upper body assist Assist Level: Minimal Assistance - Patient > 75%    Lower Body Dressing/Undressing Lower body dressing      What is the patient wearing?: Pants, Incontinence brief     Lower body assist Assist for lower  body dressing: Maximal Assistance - Patient 25 - 49%     Toileting Toileting    Toileting assist Assist for toileting: Total Assistance - Patient < 25%     Transfers Chair/bed transfer  Transfers assist     Chair/bed transfer assist level: Moderate Assistance - Patient 50 - 74%     Locomotion Ambulation   Ambulation assist      Assist level: 2 helpers (+2 for WC follow) Assistive device: Walker-rolling Max distance: 72   Walk 10 feet activity   Assist     Assist level: 2 helpers (+2 for WC follow) Assistive device: Walker-rolling   Walk 50 feet activity   Assist Walk 50 feet with 2 turns activity did not occur: Safety/medical concerns  Assist level: 2 helpers (+2 for WC  follow) Assistive device: Walker-rolling    Walk 150 feet activity   Assist Walk 150 feet activity did not occur: Safety/medical concerns (fatigue)         Walk 10 feet on uneven surface  activity   Assist Walk 10 feet on uneven surfaces activity did not occur: Safety/medical concerns         Wheelchair     Assist Is the patient using a wheelchair?: Yes Type of Wheelchair: Manual    Wheelchair assist level: Moderate Assistance - Patient 50 - 74% Max wheelchair distance: 41'    Wheelchair 50 feet with 2 turns activity    Assist        Assist Level: Moderate Assistance - Patient 50 - 74%   Wheelchair 150 feet activity     Assist      Assist Level: Total Assistance - Patient < 25%   Blood pressure (!) 117/97, pulse 75, temperature 98 F (36.7 C), temperature source Oral, resp. rate 18, height 5' 3 (1.6 m), weight 82.4 kg, SpO2 100%.  Medical Problem List and Plan: 1. 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 Formulized individual treatment plan based on medical history, underlying problem and comorbidities.    2.  Antithrombotics: -DVT/anticoagulation:  Pharmaceutical: Eliquis              -antiplatelet therapy: ECASA 3. Pain Management: tylenol  prn. Voltaren  gel qid to knee.   -10/1 arm chest wall muscle soreness  likely due to UE use with therapy. Robaxin  and lidocaine patch 4. Mood/Behavior/Sleep: LCSW to follow for evaluation and support.             --Melatonin prn.              -antipsychotic agents: N/A 5. Neuropsych/cognition: This patient is capable of making decisions on her own behalf. 6. Skin/Wound Care: Routine pressure relief measures.  7. Fluids/Electrolytes/Nutrition: Monitor I/O. Check CMET in am.  8. A fib: Monitor HR TID--on metoprolol  --Eliquis  resumed 09/18, heart rate stable 9. HTN: Monitor BP TID--on amlodipine , Lotensin  and Metoprolol .  Avoid hypotension and with long-term goal normotensive             -Fair control continue to monitor, internal medicine suggests outpatient on thiazide with benazepril  combo pill to reduce pill burden  -9/20 BP mildly elevated, continue current regimen for now would like to avoid causing hypotension  Increase toprol  XL to 62.5mg  daily  -10/1 controlled, continue to monitor     02/06/2024    5:55 AM 02/06/2024    5:00 AM 02/05/2024    7:56 PM  Vitals with BMI  Weight  181 lbs 11 oz   BMI  32.19   Systolic 117  118  Diastolic 97  70  Pulse 75  77      10. CKD: BUN/SCr 30/1.40 @ admission.  --Baseline likely 1.3-1.5 in the last few months.              --Recheck in am.              - 9/19 reorder CMP, initial lab draw hemolyzed  -9/22 BUN/Cr   elevated beyond baseline today but within her variance going back to June labs.  Jardiance  resumed 9/18 which should have helped. She is on lotensin , however. Consider holding if further trend up   -for now encourage fluids   -recheck BMET in AM  -9/23 Cr down to 1.48, around her baseline, BUN a little higher at 35, continue to encourage oral fluids  -9/25 creatinine a little higher again today at at 1.61/BUN 33, encourage oral fluids, will hold Jardiance  until recheck tomorrow.  Not a lot of fluid intake recorded.  Discussed encouraging fluids with nursing  -9/26 creatinine/BUN much improved today to  1.36/29.  Continue oral fluid intake.  Start Jardiance   -9/29 creatinine up to 1.82, stop Jardiance . NS 50ml/hr for mild AKI  Addendum refusing IVF, encourage oral fluids  -9/30 creatinine much improved to 1.28/BUN 32 after encouraging oral fluids  -10/1 continue to encourage fluid intake 11. T2DM: Hgb A1c- 6.5 and well controlled. Was on Semaglutide  1 mg, Amaryl  1 mg and Jardiance  25 mg PTA             --Jardiance  resumed on 09/18 --Monitor BS ac/hs and use SSI for elevated BS -9/19-22  fair control continue to monitor trend  9/23 controlled, discussed with pharmacy will DC sliding scale and change CBGs to twice a day -9/24 controlled overall, decrease CBG to daily -9/25 controlled continue to monitor daily for now CBG stable today consider discontinue CBGs Discontinue CBGs -10/1 stable on BMP yesterday CBG (last 3)  Recent Labs    02/04/24 0548  GLUCAP 127*      12. 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  13. Scoliosis/Spondylolisthesis: Has hx of chronic LBP w/BLE weakness --also endstage OA right knee--continue Diclofenac  gel qid  14. Hyperthyroid: continue Tapazole  5 mg daily.   15. Hyperlipidemia: LDL 123 w/goal < 70.  --continue Crestor  20 mg (was increased) and Zetia .   16. Constipation             Last BM 9/27, will decrease miralax  back to daily  9/29 continue current regimen and consider additional medication tomorrow if no BM  9/30 large bowel movement yesterday continue to monitor  10/1 LBM 9/29 consider additional medication tomorrow if no BM, sorbitol PRN        LOS: 13 days A FACE TO FACE EVALUATION WAS PERFORMED  Murray Collier 02/06/2024, 11:12 AM

## 2024-02-06 NOTE — Plan of Care (Signed)
  Problem: Consults Goal: RH STROKE PATIENT EDUCATION Description: See Patient Education module for education specifics  Outcome: Progressing   Problem: RH BOWEL ELIMINATION Goal: RH STG MANAGE BOWEL WITH ASSISTANCE Description: STG Manage Bowel with mod I Assistance. Outcome: Progressing Goal: RH STG MANAGE BOWEL W/MEDICATION W/ASSISTANCE Description: STG Manage Bowel with Medication with mod I Assistance. Outcome: Progressing   Problem: RH BLADDER ELIMINATION Goal: RH STG MANAGE BLADDER WITH ASSISTANCE Description: STG Manage Bladder With toileting Assistance Outcome: Progressing   Problem: RH SAFETY Goal: RH STG ADHERE TO SAFETY PRECAUTIONS W/ASSISTANCE/DEVICE Description: STG Adhere to Safety Precautions With cues Assistance/Device. Outcome: Progressing   Problem: RH KNOWLEDGE DEFICIT Goal: RH STG INCREASE KNOWLEDGE OF DIABETES Description: Patient and dtr will be able to manage DM using educational resources for medications and dietary modification independently Outcome: Progressing Goal: RH STG INCREASE KNOWLEDGE OF HYPERTENSION Description: Patient and dtr will be able to manage HTN using educational resources for medications and dietary modification independently Outcome: Progressing Goal: RH STG INCREASE KNOWLEDGE OF DYSPHAGIA/FLUID INTAKE Description: Patient and dtr will be able to manage Dysphagia using educational resources for medications and dietary modification independently Outcome: Progressing Goal: RH STG INCREASE KNOWLEGDE OF HYPERLIPIDEMIA Description: Patient and dtr will be able to manage HLD using educational resources for medications and dietary modification independently Outcome: Progressing Goal: RH STG INCREASE KNOWLEDGE OF STROKE PROPHYLAXIS Description: Patient and dtr will be able to manage secondary risks using educational resources for medications and dietary modification independently Outcome: Progressing

## 2024-02-06 NOTE — Progress Notes (Signed)
 Patient ID: Megan Fischer, female   DOB: 1950-08-20, 73 y.o.   MRN: [id number]  Have reviewed team conference with pt and family. Both aware and agreeable with targeted d/c date of 10/10 and goals of Contact Guard/Touching assist.   Patient's son agreeable to family ed on 10/8 between 1pm-4pm. Provided him with letter for walk-in-shower. Will provide a list of private duty aides to son.

## 2024-02-06 NOTE — Progress Notes (Signed)
 Physical Therapy Session Note  Patient Details  Name: Megan Fischer MRN: [id number] Date of Birth: February 27, 1951  Today's Date: 02/06/2024 PT Individual Time: [phone] PT Individual Time Calculation (min): 59 min   Short Term Goals: Week 2:  PT Short Term Goal 1 (Week 2): pt will perform bed to chair transfer with LRAD and min A PT Short Term Goal 2 (Week 2): pt will perform sit to stand with LRAD and min A PT Short Term Goal 3 (Week 2): pt will perform bed mobility with LRAD and CGA  Skilled Therapeutic Interventions/Progress Updates:      Pt received from OT. Pt agreeable to therapy. Pt endorses B UE soreness, requesting pain medicine, notified nursing. PT provided rest breaks and repositioning as needed.   Pt completed 4 min on nu step at level 1 with B UE on nu step with therapist assisting with management of L LE for improved foot and neutral hip positioning.   Pt performed stand pivot transfer with RW and min A verbal cues provided for sequencing/technique.   Trailed light weight manual WC for improved pt  positioning and independence with mobillity.   Pt initially opting to alterate between use of B UE and B LE 2/2 UE pain, however pt required max verbal cues frequently to reposition 2/2 L LE positioning and pt sliding forward in WC with significant posterior pelvic tilt.   Post seated rest break, pt self propelled WC day room to room with B LE and supervision/min A for navigating turns with B UE. Verbal cues provided for technique/sequencing.   Pt seated in WC at end of session with all needs within reach and seatbelt alarm on.   Therapy Documentation Precautions:  Precautions Precautions: Fall Recall of Precautions/Restrictions: Impaired Precaution/Restrictions Comments: L homonymous hemianopsia, L hemi, B TKA's pending revision per family Restrictions Weight Bearing Restrictions Per Provider Order: No  Therapy/Group: Individual Therapy  [facility] Doreene Orris, [place], DPT  02/06/2024, 7:57 AM

## 2024-02-06 NOTE — Progress Notes (Signed)
 Patient ID: Megan Fischer, female   DOB: 1951/03/05, 73 y.o.   MRN: [id number]  Demographics faxed to [facility] - [email or web address] for wheelchair consultation.

## 2024-02-06 NOTE — Progress Notes (Addendum)
 Occupational Therapy Session Note  Patient Details  Name: Megan Fischer MRN: [id number] Date of Birth: [birth year]  Today's Date: 02/06/2024 OT Individual Time: 0930-1000 OT Individual Time Calculation (min): 30 min OT Missed Time: 15 min    Short Term Goals: Week 2:  OT Short Term Goal 1 (Week 2): Patient to perform upper body dressing with Min assist OT Short Term Goal 2 (Week 2): Patient to perform shower seated with Mod assist and AE as needed OT Short Term Goal 3 (Week 2): Patient to perform shower transfer with Mod assist of one.  Skilled Therapeutic Interventions/Progress Updates:    Pt received resting in bed presenting to be in good spirits receptive to skilled OT session reporting 12/10 pain shoulder- OT offering intermittent rest breaks, repositioning, and therapeutic support to optimize participation in therapy session. Supine>EOB SBA for safety. Stand pivot EOB>WC MAX A for balance and turning. Transported to bathroom via WC. Stand pivot WC>BSC with MOD A for transfer. Void of urine. Completed wipe down while sitting on BSC with MOD A for BLEs and peri area. UB dressing with MIN A for orientation and pulling over head. MAX A for brief depends and pants d/t time management. Stand pivot BSC>WC with MOD A for turning to L and MAX A for pulling up pants while standing. Transported Pt to sink via WC. Pt completed oral care with setup A. Pt passed off to PT. Missed 15 d/t OT delay.    Therapy Documentation Precautions:  Precautions Precautions: Fall Recall of Precautions/Restrictions: Impaired Precaution/Restrictions Comments: L homonymous hemianopsia, L hemi, B TKA's pending revision per family Restrictions Weight Bearing Restrictions Per Provider Order: No  Therapy/Group: Individual Therapy  Paulina Fleeta Dixie 02/06/2024, 10:12 AM

## 2024-02-06 NOTE — Progress Notes (Signed)
 Speech Language Pathology Daily Session Note  Patient Details  Name: Megan Fischer MRN: [id number] Date of Birth: 01/01/1951  Today's Date: 02/06/2024 SLP Individual Time: 9199-9154 SLP Individual Time Calculation (min): 45 min  Short Term Goals: Week 2: SLP Short Term Goal 1 (Week 2): Patient will attend to the L during functional therapy tasks to identify L presenting task components in 4/5 opportunities given min assist. SLP Short Term Goal 2 (Week 2): Patient will recall and manipulate functional information during problem solving tasks in 80% of trials given min assist. SLP Short Term Goal 3 (Week 2): Patient will solve functional problems with 80% accuracy given overall min assist.  Skilled Therapeutic Interventions:  Pt and her son greeted at bedside for tx targeting cognition. SLP facilitated orientation review and pt was able to recall upcoming d/c in one week. She benefited from minA to recall ST goals and modA for reasoning and problem solving during education re purpose of therapies in general. SLP then facilitated written task (fill in missing letters) targeting L visual attention. She benefited from [facility] visual attention as well as sustained attention. She remains easily distractible, often conversing with her son about various requests for him to grab. She initially required encouragement to initiate writing her answers given arm soreness. Reviewed upcoming schedule for the day and she was able to recall her remaining appointments after ~2 mins. At the end of tx tasks, she was left in her [facility] w/ the alarm set and call light within reach. Recommend cont ST per POC.    Pain  Sore all over my chest and arms - 12/10 pain. Nursing notified - see EMR for more information re med administration  Therapy/Group: Individual Therapy  Recardo DELENA Mole 02/06/2024, 8:19 AM

## 2024-02-07 ENCOUNTER — Inpatient Hospital Stay (HOSPITAL_COMMUNITY)

## 2024-02-07 LAB — BASIC METABOLIC PANEL WITH GFR
Anion gap: 7 (ref 5–15)
BUN: 44 mg/dL — ABNORMAL HIGH (ref 8–23)
CO2: 21 mmol/L — ABNORMAL LOW (ref 22–32)
Calcium: 9.3 mg/dL (ref 8.9–10.3)
Chloride: 110 mmol/L (ref 98–111)
Creatinine, Ser: 1.83 mg/dL — ABNORMAL HIGH (ref 0.44–1.00)
GFR, Estimated: 29 mL/min — ABNORMAL LOW (ref >60)
Glucose, Bld: 150 mg/dL — ABNORMAL HIGH (ref 70–99)
Potassium: 4.2 mmol/L (ref 3.5–5.1)
Sodium: 138 mmol/L (ref 135–145)

## 2024-02-07 LAB — CBC
HCT: 38.1 % (ref 36.0–46.0)
Hemoglobin: 12.5 g/dL (ref 12.0–15.0)
MCH: 29.1 pg (ref 26.0–34.0)
MCHC: 32.8 g/dL (ref 30.0–36.0)
MCV: 88.8 fL (ref 80.0–100.0)
Platelets: 212 K/uL (ref 150–400)
RBC: 4.29 MIL/uL (ref 3.87–5.11)
RDW: 14.6 % (ref 11.5–15.5)
WBC: 5.2 K/uL (ref 4.0–10.5)
nRBC: 0 % (ref 0.0–0.2)

## 2024-02-07 MED ORDER — GABAPENTIN 100 MG PO CAPS
100.0000 mg | ORAL_CAPSULE | Freq: Three times a day (TID) | ORAL | Status: DC
  Administered 2024-02-07 – 2024-02-15 (×25): 100 mg via ORAL
  Filled 2024-02-07 (×25): qty 1

## 2024-02-07 MED ORDER — TRAMADOL HCL 50 MG PO TABS
50.0000 mg | ORAL_TABLET | Freq: Two times a day (BID) | ORAL | Status: DC | PRN
  Administered 2024-02-07 – 2024-02-14 (×7): 50 mg via ORAL
  Filled 2024-02-07 (×7): qty 1

## 2024-02-07 NOTE — Plan of Care (Signed)
  Problem: RH Ambulation Goal: LTG Patient will ambulate in controlled environment (PT) Description: LTG: Patient will ambulate in a controlled environment, # of feet with assistance (PT). Flowsheets (Taken 02/07/2024 1555) LTG: Pt will ambulate in controlled environ  assist needed:: (downgraded to min A for pt overall safety/consistency 2/2 pain and B LE weakness) Minimal Assistance - Patient > 75% LTG: Ambulation distance in controlled environment: 100

## 2024-02-07 NOTE — Progress Notes (Addendum)
 Physical Therapy Session Note  Patient Details  Name: Megan Fischer MRN: [id number] Date of Birth: 03-05-1951  Today's Date: 02/07/2024 PT Individual Time: 1005-1100, 1400-1415 PT Individual Time Calculation (min): 55 min, 75 min  Short Term Goals: Week 2:  PT Short Term Goal 1 (Week 2): pt will perform bed to chair transfer with LRAD and min A PT Short Term Goal 2 (Week 2): pt will perform sit to stand with LRAD and min A PT Short Term Goal 3 (Week 2): pt will perform bed mobility with LRAD and CGA  Skilled Therapeutic Interventions/Progress Updates:      Treatment Session 1   Pt supine in bed upon arrival. Pt agreeable to therapy. Pt denies any pain.   Pt performed stand pivot transfer bed to [facility] with mod A, verba cues provided for sequencing/technique with emphasis on L LE foot clearance.    Pt transported dependent in [facility] for time and energy conservation. Treatment session held outside for overall emotional well-being, mood and improved motivation.   Pt self propelled WC with B UE and R LE outdoors on even and uneven surfaces with mod progressing to min A for obstacle negotiation, verbal cues provided for attention to L visual field and scanning to R and L of environment. Pt required frequent rest breaks for fatigue.   Pt ambulated ~25 feet outside with min A and +2 for WC follow, pt heavily dependent on B UE, and vision for L LE positioning. When distracted or with forward gaze, pt demonstrates tendency to heavily exterally rotate L LE and hit it into the leg of RW.   Pt seated in WC at end of session with all needs within reach and bed alarm on.   Treatment Session 2   Pt supine in bed upon arrival. Pt agreeable to therapy. Pt endorses 8/10 B UE pain/soreness at end of session limiting pt ability to reach B UE onto bar of steady for steady transfer. Nurse present at end of session and aware. Notified IDT regarding pain and increased ROM deficits.   Family training scheduled  today to practice transfer into pt personal vehicle however pt son did not arrive for session. Attempted to contact son multiple times during session however no response.   While waiting for son to arrive, reviewed methods to reduce fall risk at home. Pt provided teach back of recommendations:  -not to get up without assistance from family member and AD  -for positioning of L LE and L UE with transfers  -need to scan to R and L of environment when propelling WC   Pt self propelled WC with B UE and R LE in bouts of ~50 feet at a time from room to main gym with seated rest breaks 2/2 fatigue with supervision  Pt weaved in and out of cones in with B UE and R LE with supervision, verbal cues provided for scanning to L side of environment for obstacle negotatiation.   Pt performed visual scanning activity on BITS to locate ABC's in alphabetical order. Pt required mod cues to scan to L side of environment.   Pt requesting to return to bed at end of session. Pt attempted to stand with RW however unable 2/2 pain/fatigue. Opted to utilize steady for safety however pt having difficulty reaching B UE to steady bar. Sit to stand with +2 mod A with nurse providing assist 2/2 BUE ROM deficits.   Pt supine in bed at end of session with all needs wtihin reach  and bed alarm on.      Therapy Documentation Precautions:  Precautions Precautions: Fall Recall of Precautions/Restrictions: Impaired Precaution/Restrictions Comments: L homonymous hemianopsia, L hemi, B TKA's pending revision per family Restrictions Weight Bearing Restrictions Per Provider Order: No   Therapy/Group: Individual Therapy  [facility] Megan Fischer, Megan Fischer, DPT  02/07/2024, 7:56 AM

## 2024-02-07 NOTE — Progress Notes (Signed)
 Speech Language Pathology Weekly Progress Note  Patient Details  Name: Megan Fischer MRN: [id number] Date of Birth: June 29, 1950  Beginning of progress report period: February 01, 2024 End of progress report period: February 07, 2024  Short Term Goals: Week 2: SLP Short Term Goal 1 (Week 2): Patient will attend to the L during functional therapy tasks to identify L presenting task components in 4/5 opportunities given min assist. SLP Short Term Goal 1 - Progress (Week 2): Progressing toward goal SLP Short Term Goal 2 (Week 2): Patient will recall and manipulate functional information during problem solving tasks in 80% of trials given min assist. SLP Short Term Goal 2 - Progress (Week 2): Progressing toward goal SLP Short Term Goal 3 (Week 2): Patient will solve functional problems with 80% accuracy given overall min assist. SLP Short Term Goal 3 - Progress (Week 2): Progressing toward goal  New Short Term Goals: Week 3: SLP Short Term Goal 1 (Week 3): STGs=LTGs d/t ELOS  Weekly Progress Updates: Patient continues to make slow but steady progress towards therapy goals, improving in all three target areas. Patient currently benefits from min to mod assist for L attention during functional therapy tasks, working memory, and functional problem solving. Goals downgraded to min assist to reflect slow progress made thus dar. Patient and family education ongoing. Patient will continue to benefit from skilled therapy services during remainder of CIR stay.     Intensity: Minumum of 1-2 x/day, 30 to 90 minutes Frequency: 3 to 5 out of 7 days Duration/Length of Stay: 10/10 Treatment/Interventions: Cognitive remediation/compensation;Internal/external aids;Cueing hierarchy;Therapeutic Activities;Functional tasks;Patient/family education  [name], M.A., CCC-SLP   [name] 02/07/2024, 10:22 AM

## 2024-02-07 NOTE — Progress Notes (Signed)
 Speech Language Pathology Daily Session Note  Patient Details  Name: Megan Fischer MRN: [id number] Date of Birth: 28-Nov-1950  Today's Date: 02/07/2024 SLP Individual Time: 1130-1200 SLP Individual Time Calculation (min): 30 min  Short Term Goals: Week 3: SLP Short Term Goal 1 (Week 3): STGs=LTGs d/t ELOS  Skilled Therapeutic Interventions: SLP conducted skilled therapy session targeting cognitive goals. SLP facilitated basic money management task with simulated bills and coins. When tasked with collecting specific sums of money, patient benefited from supervision-light min assist to identify errors and collect accurate amounts. Patient benefited when cued for slow rate and thoroughness. Throughout session, patient sustained attention to task for 25 minutes given supervision-min assist. Patient was left in room with call bell in reach and alarm set. SLP will continue to target goals per plan of care.        Pain Pain Assessment Pain Scale: 0-10 Pain Score: 10-Worst pain ever Pain Descriptors / Indicators: Aching;Headache Pain Intervention(s): Medication (See eMAR)  Therapy/Group: Individual Therapy  Rahaf Carbonell, M.A., CCC-SLP  [name] 02/07/2024, 12:44 PM

## 2024-02-07 NOTE — Progress Notes (Signed)
 Physical Therapy Weekly Progress Note  Patient Details  Name: Megan Fischer MRN: [id number] Date of Birth: [birth year]  Beginning of progress report period: April 04, 2024 End of progress report period: February 10, 2024   Patient has met 3 of 3 short term goals.  Pt has made great progress this week. Pt is performing bed mobility with use of bed features and CGA/close supervision. Sit to stand/stand pivot transfer with RW and CGA-mod A (LOA fluctuates with pain/and fatigue) , gait up to 72 feet in controlled enviornment with min A with +2 WC follow and in distracting enviornment with +2 mod A; level of assist fluctuates with B UE/knee pain, fatigue; max verbal cues provided for sequencing/technique/attention to L UE/LLE. Pt son checked off to assist with STEADY transfer in the room 9/30. Family training scheduled for transfer to pt personal vehicle on 10/2 however pt son did not show. Custom light weight manual WC evaluation scheduled with nu motion 10/7 at 1 pm.  Plan to complete additional family training with pt son on 10/8. D/C extended from 10/8 to 10/10 to allow loaner WC to be delivered. Downgraded sit to stand and bed to chair transfer goal to min A for pt overall safety and consistency.   Patient continues to demonstrate the following deficits muscle weakness and muscle joint tightness, decreased cardiorespiratoy endurance, abnormal tone, decreased coordination, and decreased motor planning, hemianopsia, decreased attention to left and decreased motor planning, decreased attention, decreased awareness, decreased problem solving, decreased safety awareness, and decreased memory, and decreased sitting balance, decreased standing balance, decreased postural control, hemiplegia, and decreased balance strategies and therefore will continue to benefit from skilled PT intervention to increase functional independence with mobility.  Patient progressing toward long term goals..  Continue plan of  care.  PT Short Term Goals Week 2:  PT Short Term Goal 1 (Week 2): pt will perform bed to chair transfer with LRAD and min A PT Short Term Goal 1 - Progress (Week 2): Met PT Short Term Goal 2 (Week 2): pt will perform sit to stand with LRAD and min A PT Short Term Goal 2 - Progress (Week 2): Met PT Short Term Goal 3 (Week 2): pt will perform bed mobility with LRAD and CGA PT Short Term Goal 3 - Progress (Week 2): Met Week 3:  PT Short Term Goal 1 (Week 3): STG=LTG 2/2 ELOS  Skilled Therapeutic Interventions/Progress Updates:      Therapy Documentation Precautions:  Precautions Precautions: Fall Recall of Precautions/Restrictions: Impaired Precaution/Restrictions Comments: L homonymous hemianopsia, L hemi, B TKA's pending revision per family Restrictions Weight Bearing Restrictions Per Provider Order: No  Therapy/Group: Individual Therapy  [facility] [place], [place], DPT  02/07/2024, 7:59 AM

## 2024-02-07 NOTE — Progress Notes (Signed)
 Continues to endorse left arm pain. Provider made aware and informed. Report from Therapy team during session patient guarded with left arm and increase in limited mobility of left arm using right arm to move arm. Pain intensifies with extension of left arm. Pain also intensifies with shrugging of left shoulder. Crepitus assessed with upper shoulder area. Scapula area tender to touch and swollen. Therapeutic touch and repositioned to aide in relieving pain.

## 2024-02-07 NOTE — Progress Notes (Signed)
 PROGRESS NOTE   Subjective/Complaints: Patient reports continued pain and soreness in her shoulders.  Robaxin  helped a little bit but also made her sleepy.  She reports Voltaren  gel has not been helping.  No additional concerns this morning.  ROS: Patient denies chills, headache, abdominal pain, nausea, vomiting, dizziness, chest pain, shortness of breath +b/l arm soreness-continued     Objective:   No results found.  Recent Labs    02/07/24 0541  WBC 5.2  HGB 12.5  HCT 38.1  PLT 212    Recent Labs    02/05/24 0522 02/07/24 0541  NA 136 138  K 3.8 4.2  CL 105 110  CO2 17* 21*  GLUCOSE 109* 150*  BUN 32* 44*  CREATININE 1.28* 1.83*  CALCIUM  9.1 9.3    Intake/Output Summary (Last 24 hours) at 02/07/2024 1040 Last data filed at 02/06/2024 1308 Gross per 24 hour  Intake 50 ml  Output --  Net 50 ml        Physical Exam: Vital Signs Blood pressure 111/61, pulse 78, temperature 98.8 F (37.1 C), resp. rate 18, height 5' 3 (1.6 m), weight 83 kg, SpO2 97%.  Constitutional: No distress . Vital signs reviewed.  Working with therapy in her room, about to have a shower HEENT: NCAT, EOMI, oral membranes moist Neck: supple Cardiovascular: RRR without murmur. No JVD    Respiratory/Chest: CTA Bilaterally without wheezes or rales. Non labored GI/Abdomen: BS +, non-tender, non-distended Ext: no clubbing, cyanosis, tr edema right lower extremity, 2+ left lower extremity -chronic for many years Psych: pleasant and cooperative Skin: Clean and intact without signs of breakdown on visible portion  PRIOR EXAMS: Neuro: Alert and oriented x 4, follows simple commands, able to name and repeat, left homonymous hemianopsia-unchanged, right gaze preference able to cross midline.  Still has a lot of difficulty with visual scanning to the left.  Left facial weakness.  Right upper and lower extremity strength appear to be  intact. Left upper 4-5 and lower extremity 3-4 out of 5 more proximal and distal  Prior neuro assessment is c/w today's exam 02/07/2024.   MSK: healed TKA incisions, no joint abnormality noted Mild TTP bilateral shoulders and pain with ROM    Assessment/Plan: 1. Functional deficits which require 3+ hours per day of interdisciplinary therapy in a comprehensive inpatient rehab setting. Physiatrist is providing close team supervision and 24 hour management of active medical problems listed below. Physiatrist and rehab team continue to assess barriers to discharge/monitor patient progress toward functional and medical goals  Care Tool:  Bathing    Body parts bathed by patient: Left arm, Chest, Front perineal area, Right upper leg, Left upper leg, Face, Right arm, Abdomen, Buttocks, Right lower leg, Left lower leg   Body parts bathed by helper: Buttocks     Bathing assist Assist Level: Minimal Assistance - Patient > 75%     Upper Body Dressing/Undressing Upper body dressing   What is the patient wearing?: Pull over shirt    Upper body assist Assist Level: Contact Guard/Touching assist    Lower Body Dressing/Undressing Lower body dressing      What is the patient wearing?: Incontinence brief  Lower body assist Assist for lower body dressing: Maximal Assistance - Patient 25 - 49%     Toileting Toileting    Toileting assist Assist for toileting: Total Assistance - Patient < 25%     Transfers Chair/bed transfer  Transfers assist     Chair/bed transfer assist level: Moderate Assistance - Patient 50 - 74%     Locomotion Ambulation   Ambulation assist      Assist level: 2 helpers (+2 for WC follow) Assistive device: Walker-rolling Max distance: 72   Walk 10 feet activity   Assist     Assist level: 2 helpers (+2 for WC follow) Assistive device: Walker-rolling   Walk 50 feet activity   Assist Walk 50 feet with 2 turns activity did not occur:  Safety/medical concerns  Assist level: 2 helpers (+2 for WC follow) Assistive device: Walker-rolling    Walk 150 feet activity   Assist Walk 150 feet activity did not occur: Safety/medical concerns (fatigue)         Walk 10 feet on uneven surface  activity   Assist Walk 10 feet on uneven surfaces activity did not occur: Safety/medical concerns         Wheelchair     Assist Is the patient using a wheelchair?: Yes Type of Wheelchair: Manual    Wheelchair assist level: Moderate Assistance - Patient 50 - 74% Max wheelchair distance: 38'    Wheelchair 50 feet with 2 turns activity    Assist        Assist Level: Moderate Assistance - Patient 50 - 74%   Wheelchair 150 feet activity     Assist      Assist Level: Total Assistance - Patient < 25%   Blood pressure 111/61, pulse 78, temperature 98.8 F (37.1 C), resp. rate 18, height 5' 3 (1.6 m), weight 83 kg, SpO2 97%.  Medical Problem List and Plan: 1. 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  Antithrombotics: -DVT/anticoagulation:  Pharmaceutical: Eliquis              -antiplatelet therapy: ECASA 3. Pain Management: tylenol  prn. Voltaren  gel qid to knee.   -10/1 arm chest wall muscle soreness likely due to UE use with therapy. Robaxin  and lidocaine patch  -10/2 will order tramadol  as needed every 12 hours 4. Mood/Behavior/Sleep: LCSW to follow for evaluation and  support.             --Melatonin prn.              -antipsychotic agents: N/A 5. Neuropsych/cognition: This patient is capable of making decisions on her own behalf. 6. Skin/Wound Care: Routine pressure relief measures.  7. Fluids/Electrolytes/Nutrition: Monitor I/O. Check CMET in am.  8. A fib: Monitor HR TID--on metoprolol  --Eliquis  resumed 09/18, heart rate stable 9. HTN: Monitor  BP TID--on amlodipine , Lotensin  and Metoprolol .  Avoid hypotension and with long-term goal normotensive             -Fair control continue to monitor, internal medicine suggests outpatient on thiazide with benazepril  combo pill to reduce pill burden  -9/20 BP mildly elevated, continue current regimen for now would like to avoid causing hypotension  Increase toprol  XL to 62.5mg  daily  -10/2 BP control continue current regimen and monitor    02/07/2024    9:35 AM 02/07/2024    5:16 AM 02/07/2024    5:15 AM  Vitals with BMI  Weight   183 lbs  BMI   32.42  Systolic 111 123   Diastolic 61 64   Pulse 78 73       10. CKD: BUN/SCr 30/1.40 @ admission.  --Baseline likely 1.3-1.5 in the last few months.              --Recheck in am.              - 9/19 reorder CMP, initial lab draw hemolyzed  -9/22 BUN/Cr   elevated beyond baseline today but within her variance going back to June labs.  Jardiance  resumed 9/18 which should have helped. She is on lotensin , however. Consider holding if further trend up   -for now encourage fluids   -recheck BMET in AM  -9/23 Cr down to 1.48, around her baseline, BUN a little higher at 35, continue to encourage oral fluids  -9/25 creatinine a little higher again today at at 1.61/BUN 33, encourage oral fluids, will hold Jardiance  until recheck tomorrow.  Not a lot of fluid intake recorded.  Discussed encouraging fluids with nursing  -9/26 creatinine/BUN much improved today to 1.36/29.  Continue oral fluid intake.  Start Jardiance   -9/29 creatinine up to 1.82, stop Jardiance . NS 50ml/hr  for mild AKI  Addendum refusing IVF, encourage oral fluids  -9/30 creatinine much improved to 1.28/BUN 32 after encouraging oral fluids  -10/2 creatinine and BUN back up, will encourage fluids and asked nursing to do the same.  Recheck tomorrow 11. T2DM: Hgb A1c- 6.5 and well controlled. Was on Semaglutide  1 mg, Amaryl  1 mg and Jardiance  25 mg PTA             --Jardiance  resumed on 09/18 --Monitor BS ac/hs and use SSI for elevated BS -9/19-22  fair control continue to monitor trend  9/23 controlled, discussed with pharmacy will DC sliding scale and change CBGs to twice a day -9/24 controlled overall, decrease CBG to daily -9/25 controlled continue to monitor daily for now CBG stable today consider discontinue CBGs Discontinue CBGs -10/2 glucose 150 on BMP continue to monitor and recheck tomorrow CBG (last 3)  No results for input(s): GLUCAP in the last 72 hours.     12. 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  13. Scoliosis/Spondylolisthesis: Has hx of chronic LBP w/BLE weakness --also endstage OA right knee--continue Diclofenac  gel qid  14. Hyperthyroid: continue Tapazole  5 mg daily.   15. Hyperlipidemia: LDL 123 w/goal < 70.  --continue Crestor  20 mg (was increased) and Zetia .   16. Constipation             Last BM 9/27, will decrease miralax  back to daily  9/29 continue current regimen and consider additional medication tomorrow if no BM  9/30 large bowel movement yesterday continue to  monitor  10/1 LBM 9/29 consider additional medication tomorrow if no BM, sorbitol PRN  10/2 LBM today and yesterday continue to monitor        LOS: 14 days A FACE TO FACE EVALUATION WAS PERFORMED  Murray Collier 02/07/2024, 10:40 AM

## 2024-02-07 NOTE — Progress Notes (Signed)
 Left pectorals, shoulder, traps hypersensitive to touch and tended to flinch away. Pain with limited ROM at shoulder--increase use reported. Will add gabapentin  for dysesthesias and may need muscle relaxer's too. pain control. Will order X ray to rule out OA/acute pathology. Question early subluxation--needs to work on positioning.

## 2024-02-07 NOTE — Progress Notes (Signed)
 Occupational Therapy Session Note  Patient Details  Name: Megan Fischer MRN: [id number] Date of Birth: 24-Jan-1951  Today's Date: 02/07/2024 OT Individual Time: 9169-9057 OT Individual Time Calculation (min): 72 min    Short Term Goals: Week 1:  OT Short Term Goal 1 (Week 1): Patient to perform upper body dressing with Min assist OT Short Term Goal 1 - Progress (Week 1): Progressing toward goal OT Short Term Goal 2 (Week 1): Patient to perform toilet transfer with Mod assist of one OT Short Term Goal 2 - Progress (Week 1): Met OT Short Term Goal 3 (Week 1): Patient to perform shower seated with Mod assist and AE as needed OT Short Term Goal 3 - Progress (Week 1): Progressing toward goal Week 2:  OT Short Term Goal 1 (Week 2): Patient to perform upper body dressing with Min assist OT Short Term Goal 2 (Week 2): Patient to perform shower seated with Mod assist and AE as needed OT Short Term Goal 3 (Week 2): Patient to perform shower transfer with Mod assist of one.   Skilled Therapeutic Interventions/Progress Updates:    Pt bed level at time of session with no pain but later c/o pain (not rated)  in L shoulder describing as arthritic pain. MD and nursing aware - planning to try voltaren . Pt wanting to shower and focused session on morning ADL routine. Supine > sit CGA, requesting Stedy to shower so sit <> stand in stedy with MIN A consistently. Stedy transfer to shower, doffed clothing standing and seated. UB/LB bathing with CGA mostly with lateral leans for buttocks, pt needs MIN A for thoroughness for periarea. Dried off same manner, total A for donning new brief instead  of depends/pull ups in standing. Perched in stedy for oral hygiene and pt requesting back to bed at this time stating this helps with her leg cramps. Hand off to nursing for med pass. Alarm on call bell in reach.  Therapy Documentation Precautions:  Precautions Precautions: Fall Recall of Precautions/Restrictions:  Impaired Precaution/Restrictions Comments: L homonymous hemianopsia, L hemi, B TKA's pending revision per family Restrictions Weight Bearing Restrictions Per Provider Order: No    Therapy/Group: Individual Therapy  Megan Fischer 02/07/2024, 7:22 AM

## 2024-02-08 LAB — BASIC METABOLIC PANEL WITH GFR
Anion gap: 9 (ref 5–15)
BUN: 39 mg/dL — ABNORMAL HIGH (ref 8–23)
CO2: 19 mmol/L — ABNORMAL LOW (ref 22–32)
Calcium: 9 mg/dL (ref 8.9–10.3)
Chloride: 109 mmol/L (ref 98–111)
Creatinine, Ser: 1.48 mg/dL — ABNORMAL HIGH (ref 0.44–1.00)
GFR, Estimated: 37 mL/min — ABNORMAL LOW (ref >60)
Glucose, Bld: 125 mg/dL — ABNORMAL HIGH (ref 70–99)
Potassium: 4.3 mmol/L (ref 3.5–5.1)
Sodium: 137 mmol/L (ref 135–145)

## 2024-02-08 NOTE — Progress Notes (Signed)
 PROGRESS NOTE   Subjective/Complaints: Bilateral arm and shoulder pain  improved today from yesterday, she had x-ray of her left shoulder yesterday that showed no acute fracture or dislocation, narrowing of subacromial interval that can be associated with rotator cuff pathology, arthritic changes.  ROS: Patient denies chills, headache, abdominal pain, nausea, vomiting, dizziness, chest pain, shortness of breath +b/l arm soreness-continued     Objective:   DG Shoulder Left Result Date: 02/07/2024 CLINICAL DATA:  [id number] Pain aggravated by exercise [id number] EXAM: LEFT SHOULDER - 2+ VIEW COMPARISON:  April 10, 2021 FINDINGS: Osteopenia. No acute fracture or dislocation. Narrowing of the subacromial interval. Mild osteophyte formation of the humeral head. Mild joint space narrowing of the acromioclavicular joint and glenohumeral joint. No area of erosion or osseous destruction. No unexpected radiopaque foreign body. Atherosclerotic calcifications. Cardiomegaly. IMPRESSION: 1. No acute fracture or dislocation. 2. Narrowing of the subacromial interval as can be seen in the setting of rotator cuff pathology. Electronically Signed   By: Corean Salter M.D.   On: 02/07/2024 16:45    Recent Labs    02/07/24 0541  WBC 5.2  HGB 12.5  HCT 38.1  PLT 212    Recent Labs    02/07/24 0541 02/08/24 0513  NA 138 137  K 4.2 4.3  CL 110 109  CO2 21* 19*  GLUCOSE 150* 125*  BUN 44* 39*  CREATININE 1.83* 1.48*  CALCIUM  9.3 9.0    Intake/Output Summary (Last 24 hours) at 02/08/2024 1245 Last data filed at 02/07/2024 1645 Gross per 24 hour  Intake 240 ml  Output --  Net 240 ml        Physical Exam: Vital Signs Blood pressure 123/73, pulse 73, temperature 97.9 F (36.6 C), resp. rate 16, height 5' 3 (1.6 m), weight 81.9 kg, SpO2 98%.  Constitutional: No distress . Vital signs reviewed.  Sitting in wheelchair in her room appears  comfortable HEENT: NCAT, EOMI, oral membranes moist Neck: supple Cardiovascular: RRR without murmur. No JVD    Respiratory/Chest: CTA Bilaterally without wheezes or rales. Non labored GI/Abdomen: BS +, non-tender, non-distended Ext: no clubbing, cyanosis, tr edema right lower extremity, 2+ left lower extremity -chronic for many years Psych: pleasant and cooperative Skin: Clean and intact without signs of breakdown on visible portion  PRIOR EXAMS: Neuro: Alert and oriented x 4, follows simple commands, able to name and repeat, left homonymous hemianopsia-unchanged, right gaze preference able to cross midline.  Still has a lot of difficulty with visual scanning to the left.  Left facial weakness.  Right upper and lower extremity strength appear to be intact,Other than right shoulder where she only ABduct said to about 40 or 50 degrees due to pain.  Left upper and lower extremity weakness 3-4 out of 5 other than left shoulder motor exam pain limited Prior neuro assessment is c/w today's exam 02/08/2024.   MSK: healed TKA incisions, no joint abnormality noted TTP left greater than right shoulder and pain with ROM although decreased from yesterday    Assessment/Plan: 1. Functional deficits which require 3+ hours per day of interdisciplinary therapy in a comprehensive inpatient rehab setting. Physiatrist is providing close team supervision and 24  hour management of active medical problems listed below. Physiatrist and rehab team continue to assess barriers to discharge/monitor patient progress toward functional and medical goals  Care Tool:  Bathing    Body parts bathed by patient: Left arm, Chest, Front perineal area, Right upper leg, Left upper leg, Face, Right arm, Abdomen, Buttocks, Right lower leg, Left lower leg   Body parts bathed by helper: Buttocks     Bathing assist Assist Level: Minimal Assistance - Patient > 75%     Upper Body Dressing/Undressing Upper body dressing   What is  the patient wearing?: Pull over shirt    Upper body assist Assist Level: Contact Guard/Touching assist    Lower Body Dressing/Undressing Lower body dressing      What is the patient wearing?: Incontinence brief     Lower body assist Assist for lower body dressing: Maximal Assistance - Patient 25 - 49%     Toileting Toileting    Toileting assist Assist for toileting: Total Assistance - Patient < 25%     Transfers Chair/bed transfer  Transfers assist     Chair/bed transfer assist level: Moderate Assistance - Patient 50 - 74%     Locomotion Ambulation   Ambulation assist      Assist level: 2 helpers (+2 for WC follow) Assistive device: Walker-rolling Max distance: 72   Walk 10 feet activity   Assist     Assist level: 2 helpers (+2 for WC follow) Assistive device: Walker-rolling   Walk 50 feet activity   Assist Walk 50 feet with 2 turns activity did not occur: Safety/medical concerns  Assist level: 2 helpers (+2 for WC follow) Assistive device: Walker-rolling    Walk 150 feet activity   Assist Walk 150 feet activity did not occur: Safety/medical concerns (fatigue)         Walk 10 feet on uneven surface  activity   Assist Walk 10 feet on uneven surfaces activity did not occur: Safety/medical concerns         Wheelchair     Assist Is the patient using a wheelchair?: Yes Type of Wheelchair: Manual    Wheelchair assist level: Moderate Assistance - Patient 50 - 74% Max wheelchair distance: 77'    Wheelchair 50 feet with 2 turns activity    Assist        Assist Level: Moderate Assistance - Patient 50 - 74%   Wheelchair 150 feet activity     Assist      Assist Level: Total Assistance - Patient < 25%   Blood pressure 123/73, pulse 73, temperature 97.9 F (36.6 C), resp. rate 16, height 5' 3 (1.6 m), weight 81.9 kg, SpO2 98%.  Medical Problem List and Plan: 1. 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  Antithrombotics: -DVT/anticoagulation:  Pharmaceutical: Eliquis              -  antiplatelet therapy: ECASA 3. Pain Management: tylenol  prn. Voltaren  gel qid to knee.   -10/1 arm chest wall muscle soreness likely due to UE use with therapy. Robaxin  and lidocaine patch  -10/2 will order tramadol  as needed every 12 hours  -10/3 left shoulder x-ray without acute changes, narrowing of subacromial interval but can be seen with rotator cuff pathology and evidence of arthritis noted.  If continues to be significant limitation could consider additional imaging.  Today she reports pain is improving so we will monitor for now. 4. Mood/Behavior/Sleep: LCSW to follow for evaluation and support.             --Melatonin prn.              -antipsychotic agents: N/A 5. Neuropsych/cognition: This patient is capable of making decisions on her own behalf. 6. Skin/Wound Care: Routine pressure relief measures.  7. Fluids/Electrolytes/Nutrition: Monitor I/O. Check CMET in am.  8. A fib: Monitor HR TID--on metoprolol  --Eliquis  resumed 09/18, heart rate stable 9. HTN: Monitor BP TID--on amlodipine , Lotensin  and Metoprolol .  Avoid hypotension and with long-term goal normotensive             -Fair control continue to monitor, internal medicine suggests outpatient on thiazide with benazepril  combo pill to reduce pill burden  -9/20 BP mildly elevated, continue current regimen for now would like to avoid  causing hypotension  Increase toprol  XL to 62.5mg  daily  -10/2-3 BP control continue current regimen and monitor    02/08/2024    9:03 AM 02/08/2024    9:02 AM 02/08/2024    9:01 AM  Vitals with BMI  Systolic 123 123 876  Diastolic 73 73 73  Pulse  73 73      10. CKD: BUN/SCr 30/1.40 @ admission.  --Baseline likely 1.3-1.5 in the last few months.              --Recheck in am.              - 9/19 reorder CMP, initial lab draw hemolyzed  -9/22 BUN/Cr   elevated beyond baseline today but within her variance going back to June labs.  Jardiance  resumed 9/18 which should have helped. She is on lotensin , however. Consider holding if further trend up   -for now encourage fluids   -recheck BMET in AM  -9/23 Cr down to 1.48, around her baseline, BUN a little higher at 35, continue to encourage oral fluids  -9/25 creatinine a little higher again today at at 1.61/BUN 33, encourage oral fluids, will hold Jardiance  until recheck tomorrow.  Not a lot of fluid intake recorded.  Discussed encouraging fluids with nursing  -9/26 creatinine/BUN much improved today to 1.36/29.  Continue oral fluid intake.  Start Jardiance   -9/29 creatinine up to 1.82, stop Jardiance . NS 50ml/hr for mild AKI  Addendum refusing IVF, encourage oral fluids  -9/30 creatinine much improved to 1.28/BUN 32 after encouraging oral fluids  -10/2 creatinine and BUN back up, will encourage fluids and asked nursing to do the same.  Recheck tomorrow  - 10/3 BUN and creatinine overall improved today, continue encourage fluid intake and recheck Monday 11. T2DM: Hgb A1c- 6.5 and well controlled. Was on Semaglutide  1 mg, Amaryl  1 mg and Jardiance  25 mg PTA             --Jardiance  resumed on 09/18 --Monitor BS ac/hs and use SSI for elevated BS -9/19-22  fair control continue to monitor trend  9/23 controlled, discussed with pharmacy will  DC sliding scale and change CBGs to twice a day -9/24 controlled overall, decrease CBG to daily -9/25  controlled continue to monitor daily for now CBG stable today consider discontinue CBGs Discontinue CBGs -10/3 glucose remains stable on BMPs CBG (last 3)  No results for input(s): GLUCAP in the last 72 hours.     12. 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  13. Scoliosis/Spondylolisthesis: Has hx of chronic LBP w/BLE weakness --also endstage OA right knee--continue Diclofenac  gel qid  14. Hyperthyroid: continue Tapazole  5 mg daily.   15. Hyperlipidemia: LDL 123 w/goal < 70.  --continue Crestor  20 mg (was increased) and Zetia .   16. Constipation             Last BM 9/27, will decrease miralax  back to daily  9/29 continue current regimen and consider additional medication tomorrow if no BM  9/30 large bowel movement yesterday continue to monitor  10/1 LBM 9/29 consider additional medication tomorrow if no BM, sorbitol PRN  10/3 LBM 10/1 1 large BM and several smaller ones.  Continue current regimen for now        LOS: 15 days A FACE TO FACE EVALUATION WAS PERFORMED  Murray Collier 02/08/2024, 12:45 PM

## 2024-02-08 NOTE — Progress Notes (Signed)
 Physical Therapy Session Note  Patient Details  Name: Megan Fischer MRN: [id number] Date of Birth: 25-Apr-1951  Today's Date: 02/08/2024 PT Individual Time: 1330-1427 PT Individual Time Calculation (min): 57 min   Short Term Goals: Week 1:  PT Short Term Goal 1 (Week 1): Pt will complete bed mobility with CGA PT Short Term Goal 1 - Progress (Week 1): Progressing toward goal PT Short Term Goal 2 (Week 1): Pt will complete bed<>chair transfers with minA and LRAD PT Short Term Goal 2 - Progress (Week 1): Partly met (pt is completing with min A with use of steady) PT Short Term Goal 3 (Week 1): Pt will ambulate 75ft with minA and LRAD PT Short Term Goal 3 - Progress (Week 1): Revised due to lack of progress PT Short Term Goal 4 (Week 1): Pt will complete functional outcome measure to assess falls risk PT Short Term Goal 4 - Progress (Week 1): Not met Week 2:  PT Short Term Goal 1 (Week 2): pt will perform bed to chair transfer with LRAD and min A PT Short Term Goal 1 - Progress (Week 2): Met PT Short Term Goal 2 (Week 2): pt will perform sit to stand with LRAD and min A PT Short Term Goal 2 - Progress (Week 2): Met PT Short Term Goal 3 (Week 2): pt will perform bed mobility with LRAD and CGA PT Short Term Goal 3 - Progress (Week 2): Met  Skilled Therapeutic Interventions/Progress Updates:    Session focused on NMR to address LUE/LLE, balance, and transitional movements during functional mobility. Supervision with extra time to come to EOB, cues for technique. Total A to don shoes - ultimately unable to get L shoe to fit properly due to edema so just wore gripper socks during session. Pt required heavy mod A for sit > stand with RW, cues for technique and very flexed posture noted. Stand step with mod A to the w/c with cues for technique. Pt declines standing activities so focused on scoot/squat pivot transfer with focus on head/hips relationship, technique and hand placement - pt distractable  throughout session requiring redirection and repeated cues. Performed transfers w/c <> mat table for squat pivot x 2 trials with mod A overall, especially to the L due to decreased body awareness. Broke down movement for NMR for practicing on edge of mat in each direction, more difficulty with movement to the L. Sit > stand retraining with RW with focus on standing tolerance and balance x 2 trials with progressing to min A. Encouraged standing transfer to the w/c with RW with min to mod A and then continued to move chair and patient able to walk ~ 10' with min to mod A overall. Propelled w/c back to room x 150' with supervision with focus on NMR for LUE coordination and control as well as L attention. Transferred back to bed with mod A for squat pivot and cues as described above. Repositioned in supine with supervision. All needs in reach.  Therapy Documentation Precautions:  Precautions Precautions: Fall Recall of Precautions/Restrictions: Impaired Precaution/Restrictions Comments: L homonymous hemianopsia, L hemi, B TKA's pending revision per family Restrictions Weight Bearing Restrictions Per Provider Order: No    Pain:  Denies pain intially, then c/o L shoulder pain. Nursing administered medication during session, pt states it doesn't help anyway. Pain did not impact therapy    Therapy/Group: Individual Therapy  Elnor Pizza Sherrell Pizza WENDI Elnor, PT, DPT, CBIS  02/08/2024, 2:37 PM

## 2024-02-08 NOTE — Plan of Care (Signed)
  Problem: Consults Goal: RH STROKE PATIENT EDUCATION Description: See Patient Education module for education specifics  Outcome: Progressing   Problem: RH BOWEL ELIMINATION Goal: RH STG MANAGE BOWEL WITH ASSISTANCE Description: STG Manage Bowel with mod I Assistance. Outcome: Progressing Goal: RH STG MANAGE BOWEL W/MEDICATION W/ASSISTANCE Description: STG Manage Bowel with Medication with mod I Assistance. Outcome: Progressing   Problem: RH BLADDER ELIMINATION Goal: RH STG MANAGE BLADDER WITH ASSISTANCE Description: STG Manage Bladder With toileting Assistance Outcome: Progressing   Problem: RH SAFETY Goal: RH STG ADHERE TO SAFETY PRECAUTIONS W/ASSISTANCE/DEVICE Description: STG Adhere to Safety Precautions With cues Assistance/Device. Outcome: Progressing   Problem: RH PAIN MANAGEMENT Goal: RH STG PAIN MANAGED AT OR BELOW PT'S PAIN GOAL Description: < 4 with prns Outcome: Progressing   Problem: RH KNOWLEDGE DEFICIT Goal: RH STG INCREASE KNOWLEDGE OF DIABETES Description: Patient and dtr will be able to manage DM using educational resources for medications and dietary modification independently Outcome: Progressing Goal: RH STG INCREASE KNOWLEDGE OF HYPERTENSION Description: Patient and dtr will be able to manage HTN using educational resources for medications and dietary modification independently Outcome: Progressing Goal: RH STG INCREASE KNOWLEDGE OF DYSPHAGIA/FLUID INTAKE Description: Patient and dtr will be able to manage Dysphagia using educational resources for medications and dietary modification independently Outcome: Progressing Goal: RH STG INCREASE KNOWLEGDE OF HYPERLIPIDEMIA Description: Patient and dtr will be able to manage HLD using educational resources for medications and dietary modification independently Outcome: Progressing Goal: RH STG INCREASE KNOWLEDGE OF STROKE PROPHYLAXIS Description: Patient and dtr will be able to manage secondary risks using  educational resources for medications and dietary modification independently Outcome: Progressing   Problem: RH Vision Goal: RH LTG Vision (Specify) Outcome: Progressing

## 2024-02-08 NOTE — Progress Notes (Signed)
 Physical Therapy Session Note  Patient Details  Name: Megan Fischer MRN: [id number] Date of Birth: 11-21-1950  Today's Date: 02/08/2024 PT Individual Time: 0900-0945 PT Individual Time Calculation (min): 45 min   Short Term Goals: Week 3:  PT Short Term Goal 1 (Week 3): STG=LTG 2/2 ELOS  Skilled Therapeutic Interventions/Progress Updates:   Pt received supine in bed, agreeable to therapy. Pt receiving meds from nsg at start of session. Pt required maxA to don paper scrub pants at start of session, able to perform stand step transfer to wc w/ RW and modA for safety, demonstrating difficulty coordinating advancement of BLE.   Pt wheeled to main gym and performed stand step transfer w/ RW and minA  to/from wc<>mat table where she performed cycling ergometer 60sec x2 seated edge of mat, given minA for posterior support due to increased posterior lean w/ exercise. Pt then performed seated heel slides on yoga ball, required +2 to sit behind pt for safety, given tactile cues to maintain each LE on ball, given AAROM assist to complete full ROM. Targeting posterior chain musculature, pre-gait activity training, and functional activity tolerance.  At end of session, pt wheeled back to room and remained upright in wc w/ call bell and all other needs in reach.   Therapy Documentation Precautions:  Precautions Precautions: Fall Recall of Precautions/Restrictions: Impaired Precaution/Restrictions Comments: L homonymous hemianopsia, L hemi, B TKA's pending revision per family Restrictions Weight Bearing Restrictions Per Provider Order: No   Therapy/Group: Individual Therapy  Oneil Grumbles 02/08/2024, 7:58 AM

## 2024-02-08 NOTE — Progress Notes (Signed)
 Occupational Therapy Weekly Progress Note  Patient Details  Name: Megan Fischer MRN: [id number] Date of Birth: 09/12/1950  Beginning of progress report period: February 01, 2024 End of progress report period: February 08, 2024  Today's Date: 02/08/2024 OT Individual Time: 9052-8940 OT Individual Time Calculation (min): 72 min    Patient has met 3 of 3 short term goals.    Patient continues to demonstrate the following deficits: muscle weakness, decreased coordination, and field cut and therefore will continue to benefit from skilled OT intervention to enhance overall performance with BADL.  Patient progressing toward long term goals..  Continue plan of care.  OT Short Term Goals Week 2:  OT Short Term Goal 1 (Week 2): Patient to perform upper body dressing with Min assist OT Short Term Goal 1 - Progress (Week 2): Met OT Short Term Goal 2 (Week 2): Patient to perform shower seated with Mod assist and AE as needed OT Short Term Goal 2 - Progress (Week 2): Met OT Short Term Goal 3 (Week 2): Patient to perform shower transfer with Mod assist of one. OT Short Term Goal 3 - Progress (Week 2): Met Week 3:  OT Short Term Goal 1 (Week 3): See LTG's secondary to LOS  Skilled Therapeutic Interventions/Progress Updates: Patient received sitting up in w/c. Agreeable to OT treatment. Patient reports feeling tired today but no change in L sensation or change in B shoulder pain. Assessed patient's progress towards STG's and LTG's. Making good progress with self care skills and does well using LUE when cued. Patient continued to display L side neglect and reduced coordination and sensation on the left extremities. Continued treatment with FM activity working with yellow theraputty. Cues to keep working with LUE and not taking over with dominant hand. Patient reporting the numbness is not as dense, but not yet normal. Continued with LUE reaching to find red and green clothes pins set on the left and placing  them on dowel at midline. Patient utilizing gross grasp vs 2/3 point pinch, but good reach and placement. Patient reports having headaches after using BUE's in therapy. Assisted patient with AAROM and scapular mobilization as well as MFR to B upper trap due to significant muscle tightness. Patient initiating reach with scapular elevation due to weakness. Worked on assisted reach without excessive elevation. Patient assisted back to room. Call light and phone within reach. Fall alarm belt in place. Continue working towards LTG's.     Therapy Documentation Precautions:  Precautions Precautions: Fall Recall of Precautions/Restrictions: Impaired Precaution/Restrictions Comments: L homonymous hemianopsia, L hemi, B TKA's pending revision per family Restrictions Weight Bearing Restrictions Per Provider Order: No General:   Vital Signs: Therapy Vitals Temp: 97.9 F (36.6 C) Pulse Rate: 69 Resp: 16 BP: 107/83 Patient Position (if appropriate): Lying Oxygen Therapy SpO2: 98 % O2 Device: Room Air Pain:Reports pain in B shoulders   ADL: ADL Eating: Set up Where Assessed-Eating: Bed level Grooming: Setup Where Assessed-Grooming: Sitting at sink Upper Body Bathing: stand by assistance Where Assessed-Upper Body Bathing: shower Lower Body Bathing: Min assistance Where Assessed-Lower Body Bathing: shower Upper Body Dressing: Min assistance Where Assessed-Upper Body Dressing: Edge of bed Lower Body Dressing: Mod Assistance Where Assessed-Lower Body Dressing: Edge of bed Toileting: Max assistance Where Assessed-Toileting: Teacher, adult education: Printmaker Method: Surveyor, minerals: Extra wide drop arm bedside commode, Acupuncturist: Mod assist Film/video editor Method: Stand pivot    Therapy/Group: Individual Therapy  Megan Fischer  Megan Fischer 02/08/2024, 12:42 PM

## 2024-02-08 NOTE — Progress Notes (Signed)
 Patient ID: Megan Fischer, female   DOB: 1951/03/05, 73 y.o.   MRN: [id number]  Received a voicemail [phone] ext: [phone]) for patient for continued stay confirmation. She was approved until 10/8 with a review date of 10/9. Ref #: [id number].

## 2024-02-08 NOTE — Progress Notes (Signed)
 Physical Therapy Session Note  Patient Details  Name: Megan Fischer MRN: [id number] Date of Birth: November 08, 1950  Today's Date: 02/08/2024 PT Individual Time: 1130-1200 PT Individual Time Calculation (min): 30 min   Short Term Goals: Week 3:  PT Short Term Goal 1 (Week 3): STG=LTG 2/2 ELOS  Skilled Therapeutic Interventions/Progress Updates: Patient finishing up nsg care (missed minutes due to) on entrance to room. Patient alert and agreeable to PT session.   Patient with no complaints of pain during session  Therapeutic Activity: Bed Mobility: Pt performed sit<supine from EOB EOB with supervision. Transfers: Pt performed sit<>stand transfer from WC<EOB with min/modA to stand and light minA to pivot in RW to EOB with VC to avoid grabbing front of RW bar.  Wheelchair Mobility:  Pt propelled wheelchair 200'+ from room down to ortho gym and back to room with supervision and R LE assisting with B UE's with pt alternating use of arms or foot or together if one is fatigued. Pt required VC to increase turns to avoid bumping into objects. Pt required several rest breaks  Patient supine in bed at end of session with brakes locked, bed alarm set, and all needs within reach.       Therapy Documentation Precautions:  Precautions Precautions: Fall Recall of Precautions/Restrictions: Impaired Precaution/Restrictions Comments: L homonymous hemianopsia, L hemi, B TKA's pending revision per family Restrictions Weight Bearing Restrictions Per Provider Order: No   Therapy/Group: Individual Therapy  Madine Sarr PTA 02/08/2024, 12:37 PM

## 2024-02-09 MED ORDER — SIMETHICONE 80 MG PO CHEW
160.0000 mg | CHEWABLE_TABLET | Freq: Four times a day (QID) | ORAL | Status: DC
  Administered 2024-02-09 – 2024-02-15 (×21): 160 mg via ORAL
  Filled 2024-02-09 (×23): qty 2

## 2024-02-09 NOTE — Plan of Care (Signed)
  Problem: Consults Goal: RH STROKE PATIENT EDUCATION Description: See Patient Education module for education specifics  Outcome: Progressing   Problem: RH BOWEL ELIMINATION Goal: RH STG MANAGE BOWEL WITH ASSISTANCE Description: STG Manage Bowel with mod I Assistance. Outcome: Progressing Goal: RH STG MANAGE BOWEL W/MEDICATION W/ASSISTANCE Description: STG Manage Bowel with Medication with mod I Assistance. Outcome: Progressing   Problem: RH BLADDER ELIMINATION Goal: RH STG MANAGE BLADDER WITH ASSISTANCE Description: STG Manage Bladder With toileting Assistance Outcome: Progressing   Problem: RH SAFETY Goal: RH STG ADHERE TO SAFETY PRECAUTIONS W/ASSISTANCE/DEVICE Description: STG Adhere to Safety Precautions With cues Assistance/Device. Outcome: Progressing   Problem: RH PAIN MANAGEMENT Goal: RH STG PAIN MANAGED AT OR BELOW PT'S PAIN GOAL Description: < 4 with prns Outcome: Progressing   Problem: RH KNOWLEDGE DEFICIT Goal: RH STG INCREASE KNOWLEDGE OF DIABETES Description: Patient and dtr will be able to manage DM using educational resources for medications and dietary modification independently Outcome: Progressing Goal: RH STG INCREASE KNOWLEDGE OF HYPERTENSION Description: Patient and dtr will be able to manage HTN using educational resources for medications and dietary modification independently Outcome: Progressing Goal: RH STG INCREASE KNOWLEDGE OF DYSPHAGIA/FLUID INTAKE Description: Patient and dtr will be able to manage Dysphagia using educational resources for medications and dietary modification independently Outcome: Progressing Goal: RH STG INCREASE KNOWLEGDE OF HYPERLIPIDEMIA Description: Patient and dtr will be able to manage HLD using educational resources for medications and dietary modification independently Outcome: Progressing Goal: RH STG INCREASE KNOWLEDGE OF STROKE PROPHYLAXIS Description: Patient and dtr will be able to manage secondary risks using  educational resources for medications and dietary modification independently Outcome: Progressing   Problem: RH Vision Goal: RH LTG Vision (Specify) Outcome: Progressing

## 2024-02-09 NOTE — Progress Notes (Signed)
 PROGRESS NOTE   Subjective/Complaints:  Pt reports having a lot fo gas- wants gax x. No other complaints this Am- woke from sleep    ROS:  Pt denies SOB, abd pain, CP, N/V/C/D, and vision changes  +b/l arm soreness-continued     Objective:   No results found.   Recent Labs    02/07/24 0541  WBC 5.2  HGB 12.5  HCT 38.1  PLT 212    Recent Labs    02/07/24 0541 02/08/24 0513  NA 138 137  K 4.2 4.3  CL 110 109  CO2 21* 19*  GLUCOSE 150* 125*  BUN 44* 39*  CREATININE 1.83* 1.48*  CALCIUM  9.3 9.0    Intake/Output Summary (Last 24 hours) at 02/09/2024 1645 Last data filed at 02/09/2024 0746 Gross per 24 hour  Intake 220 ml  Output --  Net 220 ml        Physical Exam: Vital Signs Blood pressure 132/64, pulse 68, temperature 98 F (36.7 C), temperature source Oral, resp. rate 16, height 5' 3 (1.6 m), weight 82.4 kg, SpO2 100%.   General: awake, alert, appropriate, woke from sleep; supine in bed;  NAD HENT: conjugate gaze; oropharynx moist CV: regular rate; no JVD Pulmonary: CTA B/L; no W/R/R- good air movement GI: soft, NT, ND, (+)BS- slightly hyperactive Psychiatric: appropriate, mildly interactive Neurological: alert- sleepy- woke from sleep Ext: no clubbing, cyanosis, tr edema right lower extremity, 2+ left lower extremity -chronic for many years Psych: pleasant and cooperative Skin: Clean and intact without signs of breakdown on visible portion  PRIOR EXAMS: Neuro: Alert and oriented x 4, follows simple commands, able to name and repeat, left homonymous hemianopsia-unchanged, right gaze preference able to cross midline.  Still has a lot of difficulty with visual scanning to the left.  Left facial weakness.  Right upper and lower extremity strength appear to be intact,Other than right shoulder where she only ABduct said to about 40 or 50 degrees due to pain.  Left upper and lower extremity  weakness 3-4 out of 5 other than left shoulder motor exam pain limited Prior neuro assessment is c/w today's exam 02/09/2024.   MSK: healed TKA incisions, no joint abnormality noted TTP left greater than right shoulder and pain with ROM although decreased from yesterday    Assessment/Plan: 1. Functional deficits which require 3+ hours per day of interdisciplinary therapy in a comprehensive inpatient rehab setting. Physiatrist is providing close team supervision and 24 hour management of active medical problems listed below. Physiatrist and rehab team continue to assess barriers to discharge/monitor patient progress toward functional and medical goals  Care Tool:  Bathing    Body parts bathed by patient: Left arm, Chest, Front perineal area, Right upper leg, Left upper leg, Face, Right arm, Abdomen, Buttocks, Right lower leg, Left lower leg   Body parts bathed by helper: Buttocks     Bathing assist Assist Level: Minimal Assistance - Patient > 75%     Upper Body Dressing/Undressing Upper body dressing   What is the patient wearing?: Pull over shirt    Upper body assist Assist Level: Contact Guard/Touching assist    Lower Body Dressing/Undressing Lower body dressing  What is the patient wearing?: Incontinence brief     Lower body assist Assist for lower body dressing: Maximal Assistance - Patient 25 - 49%     Toileting Toileting    Toileting assist Assist for toileting: Total Assistance - Patient < 25%     Transfers Chair/bed transfer  Transfers assist     Chair/bed transfer assist level: Moderate Assistance - Patient 50 - 74%     Locomotion Ambulation   Ambulation assist      Assist level: 2 helpers Assistive device: Walker-rolling Max distance: 10'   Walk 10 feet activity   Assist     Assist level: 2 helpers Assistive device: Walker-rolling   Walk 50 feet activity   Assist Walk 50 feet with 2 turns activity did not occur:  Safety/medical concerns  Assist level: 2 helpers (+2 for WC follow) Assistive device: Walker-rolling    Walk 150 feet activity   Assist Walk 150 feet activity did not occur: Safety/medical concerns (fatigue)         Walk 10 feet on uneven surface  activity   Assist Walk 10 feet on uneven surfaces activity did not occur: Safety/medical concerns         Wheelchair     Assist Is the patient using a wheelchair?: Yes Type of Wheelchair: Manual    Wheelchair assist level: Supervision/Verbal cueing Max wheelchair distance: 150'    Wheelchair 50 feet with 2 turns activity    Assist        Assist Level: Supervision/Verbal cueing   Wheelchair 150 feet activity     Assist      Assist Level: Supervision/Verbal cueing   Blood pressure 132/64, pulse 68, temperature 98 F (36.7 C), temperature source Oral, resp. rate 16, height 5' 3 (1.6 m), weight 82.4 kg, SpO2 100%.  Medical Problem List and Plan: 1. 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  Antithrombotics: -DVT/anticoagulation:  Pharmaceutical: Eliquis              -antiplatelet therapy: ECASA 3. Pain Management: tylenol  prn. Voltaren  gel qid to knee.   -10/1 arm chest wall muscle soreness likely due to UE use with therapy. Robaxin  and lidocaine patch  -10/2 will order tramadol  as needed every 12 hours  -10/3 left shoulder x-ray  without acute changes, narrowing of subacromial interval but can be seen with rotator cuff pathology and evidence of arthritis noted.  If continues to be significant limitation could consider additional imaging.  Today she reports pain is improving so we will monitor for now.  10/4- no complaints of pain this AM except gas pain 4. Mood/Behavior/Sleep: LCSW to follow for evaluation and support.             --Melatonin prn.              -  antipsychotic agents: N/A 5. Neuropsych/cognition: This patient is capable of making decisions on her own behalf. 6. Skin/Wound Care: Routine pressure relief measures.  7. Fluids/Electrolytes/Nutrition: Monitor I/O. Check CMET in am.  8. A fib: Monitor HR TID--on metoprolol  --Eliquis  resumed 09/18, heart rate stable 9. HTN: Monitor BP TID--on amlodipine , Lotensin  and Metoprolol .  Avoid hypotension and with long-term goal normotensive             -Fair control continue to monitor, internal medicine suggests outpatient on thiazide with benazepril  combo pill to reduce pill burden  -9/20 BP mildly elevated, continue current regimen for now would like to avoid causing hypotension  Increase toprol  XL to 62.5mg  daily  -10/2-3 BP control continue current regimen and monitor 10/4- BP controlled- con't regimen    02/09/2024    4:52 AM 02/09/2024    4:19 AM 02/08/2024    7:25 PM  Vitals with BMI  Weight 181 lbs 11 oz    BMI 32.19    Systolic  132 120  Diastolic  64 56  Pulse  68 79      10. CKD: BUN/SCr 30/1.40 @ admission.  --Baseline likely 1.3-1.5 in the last few months.              --Recheck in am.              - 9/19 reorder CMP, initial lab draw hemolyzed  -9/22 BUN/Cr   elevated beyond baseline today but within her variance going back to June labs.  Jardiance  resumed 9/18 which should have helped. She is on lotensin , however. Consider holding if further trend up   -for now encourage fluids   -recheck BMET in AM  -9/23 Cr down to 1.48, around her  baseline, BUN a little higher at 35, continue to encourage oral fluids  -9/25 creatinine a little higher again today at at 1.61/BUN 33, encourage oral fluids, will hold Jardiance  until recheck tomorrow.  Not a lot of fluid intake recorded.  Discussed encouraging fluids with nursing  -9/26 creatinine/BUN much improved today to 1.36/29.  Continue oral fluid intake.  Start Jardiance   -9/29 creatinine up to 1.82, stop Jardiance . NS 50ml/hr for mild AKI  Addendum refusing IVF, encourage oral fluids  -9/30 creatinine much improved to 1.28/BUN 32 after encouraging oral fluids  -10/2 creatinine and BUN back up, will encourage fluids and asked nursing to do the same.  Recheck tomorrow  - 10/3 BUN and creatinine overall improved today, continue encourage fluid intake and recheck Monday 11. T2DM: Hgb A1c- 6.5 and well controlled. Was on Semaglutide  1 mg, Amaryl  1 mg and Jardiance  25 mg PTA             --Jardiance  resumed on 09/18 --Monitor BS ac/hs and use SSI for elevated BS -9/19-22  fair control continue to monitor trend  9/23 controlled, discussed with pharmacy will DC sliding scale and change CBGs to twice a day -9/24 controlled overall, decrease CBG to daily -9/25 controlled continue to monitor daily for now CBG stable today consider discontinue CBGs Discontinue CBGs -10/3 glucose remains stable on BMPs CBG (last 3)  No results for input(s): GLUCAP in the last 72 hours.     12. 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  13. Scoliosis/Spondylolisthesis: Has hx of chronic LBP w/BLE weakness --also endstage OA right knee--continue Diclofenac  gel qid  14. Hyperthyroid: continue Tapazole   5 mg daily.   15. Hyperlipidemia: LDL 123 w/goal < 70.  --continue Crestor  20 mg (was increased) and Zetia .   16. Constipation             Last BM 9/27, will decrease miralax  back to daily  9/29 continue current regimen and consider  additional medication tomorrow if no BM  9/30 large bowel movement yesterday continue to monitor  10/1 LBM 9/29 consider additional medication tomorrow if no BM, sorbitol PRN  10/3 LBM 10/1 1 large BM and several smaller ones.  Continue current regimen for now  17. Hyperactive gas  10/4- will order Gasx 160 mg QID   I spent a total of 35   minutes on total care today- >50% coordination of care- due to  D/w pt about Sx's- made meds changes- d/w nursing and reviewed chart, labs and vitals      LOS: 16 days A FACE TO FACE EVALUATION WAS PERFORMED  Megan Fischer 02/09/2024, 4:45 PM

## 2024-02-10 NOTE — Plan of Care (Signed)
  Problem: Sit to Stand Goal: LTG:  Patient will perform sit to stand with assistance level (PT) Description: LTG:  Patient will perform sit to stand with assistance level (PT) Flowsheets (Taken 02/10/2024 1046) LTG: PT will perform sit to stand in preparation for functional mobility with assistance level: (downgraded to min A for overall pt safety and consistency) Minimal Assistance - Patient > 75%   Problem: RH Bed to Chair Transfers Goal: LTG Patient will perform bed/chair transfers w/assist (PT) Description: LTG: Patient will perform bed to chair transfers with assistance (PT). Flowsheets (Taken 02/10/2024 1046) LTG: Pt will perform Bed to Chair Transfers with assistance level: (downgraded to min A for overall safety and consistency) Minimal Assistance - Patient > 75%

## 2024-02-10 NOTE — Progress Notes (Signed)
 PROGRESS NOTE   Subjective/Complaints:  Pt reports no issues  Pt killed her doing therapy.  Denies pain- slept well LBM this AM  ROS:   Pt denies SOB, abd pain, CP, N/V/C/D, and vision changes   +b/l arm soreness-continued     Objective:   No results found.   No results for input(s): WBC, HGB, HCT, PLT in the last 72 hours.   Recent Labs    02/08/24 0513  NA 137  K 4.3  CL 109  CO2 19*  GLUCOSE 125*  BUN 39*  CREATININE 1.48*  CALCIUM  9.0    Intake/Output Summary (Last 24 hours) at 02/10/2024 1708 Last data filed at 02/10/2024 0700 Gross per 24 hour  Intake 588 ml  Output --  Net 588 ml        Physical Exam: Vital Signs Blood pressure (!) 106/55, pulse 61, temperature 98.5 F (36.9 C), resp. rate 16, height 5' 3 (1.6 m), weight 84 kg, SpO2 98%.   \  General: awake, alert, appropriate, frustrated to be interrupted  NAD HENT: conjugate gaze; oropharynx moist CV: regular rate and rhythm; no JVD Pulmonary: CTA B/L; no W/R/R- good air movement GI: soft, NT, ND, (+)BS Psychiatric: appropriate, but frustrated about therapy Neurological: Ox3 Slightly delayed and kept staring at wall- didn't make eye contact Ext: no clubbing, cyanosis, tr edema right lower extremity, 2+ left lower extremity -chronic for many years Psych: pleasant and cooperative Skin: Clean and intact without signs of breakdown on visible portion  PRIOR EXAMS: Neuro: Alert and oriented x 4, follows simple commands, able to name and repeat, left homonymous hemianopsia-unchanged, right gaze preference able to cross midline.  Still has a lot of difficulty with visual scanning to the left.  Left facial weakness.  Right upper and lower extremity strength appear to be intact,Other than right shoulder where she only ABduct said to about 40 or 50 degrees due to pain.  Left upper and lower extremity weakness 3-4 out of 5 other  than left shoulder motor exam pain limited Prior neuro assessment is c/w today's exam 02/10/2024.   MSK: healed TKA incisions, no joint abnormality noted TTP left greater than right shoulder and pain with ROM although decreased from yesterday    Assessment/Plan: 1. Functional deficits which require 3+ hours per day of interdisciplinary therapy in a comprehensive inpatient rehab setting. Physiatrist is providing close team supervision and 24 hour management of active medical problems listed below. Physiatrist and rehab team continue to assess barriers to discharge/monitor patient progress toward functional and medical goals  Care Tool:  Bathing    Body parts bathed by patient: Left arm, Chest, Front perineal area, Right upper leg, Left upper leg, Face, Right arm, Abdomen, Buttocks, Right lower leg, Left lower leg   Body parts bathed by helper: Buttocks     Bathing assist Assist Level: Minimal Assistance - Patient > 75%     Upper Body Dressing/Undressing Upper body dressing   What is the patient wearing?: Pull over shirt    Upper body assist Assist Level: Contact Guard/Touching assist    Lower Body Dressing/Undressing Lower body dressing      What is the patient wearing?:  Incontinence brief     Lower body assist Assist for lower body dressing: Maximal Assistance - Patient 25 - 49%     Toileting Toileting    Toileting assist Assist for toileting: Total Assistance - Patient < 25%     Transfers Chair/bed transfer  Transfers assist     Chair/bed transfer assist level: Moderate Assistance - Patient 50 - 74%     Locomotion Ambulation   Ambulation assist      Assist level: 2 helpers Assistive device: Walker-rolling Max distance: 10'   Walk 10 feet activity   Assist     Assist level: 2 helpers Assistive device: Walker-rolling   Walk 50 feet activity   Assist Walk 50 feet with 2 turns activity did not occur: Safety/medical concerns  Assist level:  2 helpers (+2 for WC follow) Assistive device: Walker-rolling    Walk 150 feet activity   Assist Walk 150 feet activity did not occur: Safety/medical concerns (fatigue)         Walk 10 feet on uneven surface  activity   Assist Walk 10 feet on uneven surfaces activity did not occur: Safety/medical concerns         Wheelchair     Assist Is the patient using a wheelchair?: Yes Type of Wheelchair: Manual    Wheelchair assist level: Supervision/Verbal cueing Max wheelchair distance: 150'    Wheelchair 50 feet with 2 turns activity    Assist        Assist Level: Supervision/Verbal cueing   Wheelchair 150 feet activity     Assist      Assist Level: Supervision/Verbal cueing   Blood pressure (!) 106/55, pulse 61, temperature 98.5 F (36.9 C), resp. rate 16, height 5' 3 (1.6 m), weight 84 kg, SpO2 98%.  Medical Problem List and Plan: 1. 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  Antithrombotics: -DVT/anticoagulation:  Pharmaceutical: Eliquis              -antiplatelet therapy: ECASA 3. Pain Management: tylenol  prn. Voltaren  gel qid to knee.   -10/1 arm chest wall muscle soreness likely due to UE use with therapy. Robaxin  and lidocaine patch  -10/2 will order tramadol  as needed every 12 hours  -10/3 left shoulder x-ray without acute changes, narrowing of subacromial interval but can be  seen with rotator cuff pathology and evidence of arthritis noted.  If continues to be significant limitation could consider additional imaging.  Today she reports pain is improving so we will monitor for now.  10/4- no complaints of pain this AM except gas pain 4. Mood/Behavior/Sleep: LCSW to follow for evaluation and support.             --Melatonin prn.              -  antipsychotic agents: N/A 5. Neuropsych/cognition: This patient is capable of making decisions on her own behalf. 6. Skin/Wound Care: Routine pressure relief measures.  7. Fluids/Electrolytes/Nutrition: Monitor I/O. Check CMET in am.  8. A fib: Monitor HR TID--on metoprolol  --Eliquis  resumed 09/18, heart rate stable 9. HTN: Monitor BP TID--on amlodipine , Lotensin  and Metoprolol .  Avoid hypotension and with long-term goal normotensive             -Fair control continue to monitor, internal medicine suggests outpatient on thiazide with benazepril  combo pill to reduce pill burden  -9/20 BP mildly elevated, continue current regimen for now would like to avoid causing hypotension  Increase toprol  XL to 62.5mg  daily  -10/2-3 BP control continue current regimen and monitor 10/4-10/5- BP Soft to  BP controlled- con't regimen    02/10/2024    5:33 AM 02/09/2024    7:54 PM 02/09/2024    2:02 PM  Vitals with BMI  Weight 185 lbs 3 oz    BMI 32.81    Systolic 106 110 892  Diastolic 55 43 59  Pulse 61 74 77      10. CKD: BUN/SCr 30/1.40 @ admission.  --Baseline likely 1.3-1.5 in the last few months.              --Recheck in am.              - 9/19 reorder CMP, initial lab draw hemolyzed  -9/22 BUN/Cr   elevated beyond baseline today but within her variance going back to June labs.  Jardiance  resumed 9/18 which should have helped. She is on lotensin , however. Consider holding if further trend up   -for now encourage fluids   -recheck BMET in AM  -9/23 Cr down to 1.48, around her baseline, BUN a little higher at 35, continue to  encourage oral fluids  -9/25 creatinine a little higher again today at at 1.61/BUN 33, encourage oral fluids, will hold Jardiance  until recheck tomorrow.  Not a lot of fluid intake recorded.  Discussed encouraging fluids with nursing  -9/26 creatinine/BUN much improved today to 1.36/29.  Continue oral fluid intake.  Start Jardiance   -9/29 creatinine up to 1.82, stop Jardiance . NS 50ml/hr for mild AKI  Addendum refusing IVF, encourage oral fluids  -9/30 creatinine much improved to 1.28/BUN 32 after encouraging oral fluids  -10/2 creatinine and BUN back up, will encourage fluids and asked nursing to do the same.  Recheck tomorrow  - 10/3 BUN and creatinine overall improved today, continue encourage fluid intake and recheck Monday 11. T2DM: Hgb A1c- 6.5 and well controlled. Was on Semaglutide  1 mg, Amaryl  1 mg and Jardiance  25 mg PTA             --Jardiance  resumed on 09/18 --Monitor BS ac/hs and use SSI for elevated BS -9/19-22  fair control continue to monitor trend  9/23 controlled, discussed with pharmacy will DC sliding scale and change CBGs to twice a day -9/24 controlled overall, decrease CBG to daily -9/25 controlled continue to monitor daily for now CBG stable today consider discontinue CBGs Discontinue CBGs -10/3 glucose remains stable on BMPs CBG (last 3)  No results for input(s): GLUCAP in the last 72 hours.     12. 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  13. Scoliosis/Spondylolisthesis: Has hx of chronic LBP w/BLE weakness --also endstage OA right knee--continue Diclofenac  gel qid  14. Hyperthyroid: continue Tapazole  5 mg daily.   15. Hyperlipidemia: LDL 123 w/goal < 70.  --continue Crestor  20 mg (was increased) and Zetia .   16. Constipation             Last BM 9/27, will decrease miralax  back to daily  9/29 continue current regimen and consider additional medication tomorrow if no BM  9/30  large bowel movement yesterday continue to monitor  10/1 LBM 9/29 consider additional medication tomorrow if no BM, sorbitol PRN  10/3 LBM 10/1 1 large BM and several smaller ones.  Continue current regimen for now  17. Hyperactive gas  10/4- will order Gasx 160 mg QID       LOS: 17 days A FACE TO FACE EVALUATION WAS PERFORMED  Megan Fischer 02/10/2024, 5:08 PM

## 2024-02-10 NOTE — Progress Notes (Signed)
 Physical Therapy Session Note  Patient Details  Name: Megan Fischer MRN: [id number] Date of Birth: 1950/06/20  Today's Date: 02/10/2024 PT Individual Time: 1002-1100 PT Individual Time Calculation (min): 58 min   Short Term Goals: Week 2:  PT Short Term Goal 1 (Week 2): pt will perform bed to chair transfer with LRAD and min A PT Short Term Goal 1 - Progress (Week 2): Met PT Short Term Goal 2 (Week 2): pt will perform sit to stand with LRAD and min A PT Short Term Goal 2 - Progress (Week 2): Met PT Short Term Goal 3 (Week 2): pt will perform bed mobility with LRAD and CGA PT Short Term Goal 3 - Progress (Week 2): Met  Skilled Therapeutic Interventions/Progress Updates:       Pt supine in bed upon arrival. Pt agreeable to therapy. Pt denies any pain.   Supine to sit with use of bed features and supervision.   Sit to stand: RW and CGA from hospital bed/mod A from light weight manual WC.   Stand pivot transfer: bed to chair with RW bed to chair, mod A for light weight manual WC to bed. Vebral cues provided for L LE/UE positioning, with sequencing and technique with attention to L UE/LE.   Gait: ambulatd ~44feet with +2 for WC follow however pt self discontinued 2/2 fatigue and soreness. Max verbal cues provided for L LE positioning.   Scooting EOB: pt perfomred scooting EOB to R with supervision, and to L with CGA, max verbal cues provided for L LE position. Pt demonstrates improved proprioception today with pt scooting laterally versus anterolaterally towards edge of bed. Pt required ++ time and rest breaks to perform to the left.   WC propulsion 150 feet with B UE/R LE and supervision, verbal cues provided for attention to L visual field for obstacle negotiation.   Pt supine in bed at end of session with all needs within reach and bed alarm on.         Therapy Documentation Precautions:  Precautions Precautions: Fall Recall of Precautions/Restrictions:  Impaired Precaution/Restrictions Comments: L homonymous hemianopsia, L hemi, B TKA's pending revision per family Restrictions Weight Bearing Restrictions Per Provider Order: No   Therapy/Group: Individual Therapy  [facility] Doreene Orris, [place], DPT  02/10/2024, 7:49 AM

## 2024-02-11 DIAGNOSIS — M792 Neuralgia and neuritis, unspecified: Secondary | ICD-10-CM

## 2024-02-11 LAB — CBC
HCT: 35.1 % — ABNORMAL LOW (ref 36.0–46.0)
Hemoglobin: 11.3 g/dL — ABNORMAL LOW (ref 12.0–15.0)
MCH: 29 pg (ref 26.0–34.0)
MCHC: 32.2 g/dL (ref 30.0–36.0)
MCV: 90.2 fL (ref 80.0–100.0)
Platelets: 183 K/uL (ref 150–400)
RBC: 3.89 MIL/uL (ref 3.87–5.11)
RDW: 14.8 % (ref 11.5–15.5)
WBC: 4.7 K/uL (ref 4.0–10.5)
nRBC: 0 % (ref 0.0–0.2)

## 2024-02-11 LAB — BASIC METABOLIC PANEL WITH GFR
Anion gap: 8 (ref 5–15)
BUN: 45 mg/dL — ABNORMAL HIGH (ref 8–23)
CO2: 18 mmol/L — ABNORMAL LOW (ref 22–32)
Calcium: 9 mg/dL (ref 8.9–10.3)
Chloride: 113 mmol/L — ABNORMAL HIGH (ref 98–111)
Creatinine, Ser: 1.44 mg/dL — ABNORMAL HIGH (ref 0.44–1.00)
GFR, Estimated: 39 mL/min — ABNORMAL LOW (ref >60)
Glucose, Bld: 154 mg/dL — ABNORMAL HIGH (ref 70–99)
Potassium: 4.2 mmol/L (ref 3.5–5.1)
Sodium: 139 mmol/L (ref 135–145)

## 2024-02-11 MED ORDER — AMLODIPINE BESYLATE 5 MG PO TABS
5.0000 mg | ORAL_TABLET | Freq: Every day | ORAL | Status: DC
  Administered 2024-02-12 – 2024-02-13 (×2): 5 mg via ORAL
  Filled 2024-02-11 (×2): qty 1

## 2024-02-11 NOTE — Progress Notes (Signed)
 Physical Therapy Session Note  Patient Details  Name: Megan Fischer MRN: [id number] Date of Birth: [birth year]  Today's Date: 02/11/2024 PT Individual Time: 0904-1001 PT Individual Time Calculation (min): 57 min   Short Term Goals: Week 1:  PT Short Term Goal 1 (Week 1): Pt will complete bed mobility with CGA PT Short Term Goal 1 - Progress (Week 1): Progressing toward goal PT Short Term Goal 2 (Week 1): Pt will complete bed<>chair transfers with minA and LRAD PT Short Term Goal 2 - Progress (Week 1): Partly met (pt is completing with min A with use of steady) PT Short Term Goal 3 (Week 1): Pt will ambulate 54ft with minA and LRAD PT Short Term Goal 3 - Progress (Week 1): Revised due to lack of progress PT Short Term Goal 4 (Week 1): Pt will complete functional outcome measure to assess falls risk PT Short Term Goal 4 - Progress (Week 1): Not met Week 2:  PT Short Term Goal 1 (Week 2): pt will perform bed to chair transfer with LRAD and min A PT Short Term Goal 1 - Progress (Week 2): Met PT Short Term Goal 2 (Week 2): pt will perform sit to stand with LRAD and min A PT Short Term Goal 2 - Progress (Week 2): Met PT Short Term Goal 3 (Week 2): pt will perform bed mobility with LRAD and CGA PT Short Term Goal 3 - Progress (Week 2): Met Week 3:  PT Short Term Goal 1 (Week 3): STG=LTG 2/2 ELOS  Skilled Therapeutic Interventions/Progress Updates:  Patient seated upright in w/c on entrance to room. Patient alert and agreeable to PT session.   Patient with no pain complaint at start of session. Does complain of swelling in LLE and despite loosening of shoes, is unable to tolerate donning of L shoe. Discussed potential need for compression to LLE.   Therapeutic Activity: Transfers: Pt performed sit<>stand and stand pivot transfers throughout session with MinA initially for power up and CGA by end of session. Continues to place hands to RW requiring vc for hand positioning to push from armrests.    Taken to ADL apartment and pt hesitant initially to attempt bed mobility. However, using RW is able to stand pivot with CGA/ MinA and sit to EOB. Without RW, pt is able to posteriorly scoot for better bed positioning and then initiates move to supine and rolls to R side with overall CGA/ SBA. Is able to roll to L side and return to upright seated position on EOB with SBA. Discussed need to keep RW out of way in order to bring BLE up onto bed surface when getting into bed. But close enough to reach when needing to get OOB after she returns home.   Gait Training:  Pt ambulated 30' x1 using hallway HR/ 110' x1 using RW with MinA improving to CGA with close w/c follow for fatigue. Demonstrated flexed posture with veer to L requiring vc/ tc for path correction. Also requires block to L knee initially but then pt able to gently place in locked position for stance phase. Gentle facilitation provided to knee position just outside of TKE during stance phase with pt able to hold extension with guard to L knee.  Pt relates having rollator at home and discussed potential of rollator to move forward requiring increased control from pt. RW appears to be best AD for ambulation at this time.   Patient seated upright in w/c at end of session with brakes locked, belt  alarm set, and all needs within reach. Oriented to time and time of next session in 15 min.    Therapy Documentation Precautions:  Precautions Precautions: Fall Recall of Precautions/Restrictions: Impaired Precaution/Restrictions Comments: L homonymous hemianopsia, L hemi, B TKA's pending revision per family Restrictions Weight Bearing Restrictions Per Provider Order: No  Pain:  No pain related during this session.   Therapy/Group: Individual Therapy  Mliss DELENA Milliner PT, DPT, CSRS 02/11/2024, 6:24 PM

## 2024-02-11 NOTE — Progress Notes (Signed)
 Physical Therapy Session Note  Patient Details  Name: Megan Fischer MRN: [id number] Date of Birth: [birth year]  Today's Date: 02/11/2024 PT Individual Time: 1022-1117 PT Individual Time Calculation (min): 55 min   Short Term Goals: Week 3:  PT Short Term Goal 1 (Week 3): STG=LTG 2/2 ELOS  Skilled Therapeutic Interventions/Progress Updates:      Pt seated in light weight WC upon arrival. Pt agreeable to therapy. Pt denies any pain.   Pt transported dependent in [facility] to ortho gym for time/energy conservation.   Pt performed stand pivot transfer WC to car simulator at height of SUV with RW and heavy min A for power up, CGA for pivot. Pt raised B LE into car with supervision. Veral cues provided for sequencing/technique.   Pt ambulated 1x~15, 1X~20 feet with RW and min A and performed ambulatory trasnfer to mat table with RW and min A, verbal cues provided for sequencing/technique and safety with RW.   Pt continuously expressed interest in wanting to go street hopping upon discharge. Discussed with pt what all this entails (driving in car, going to various shops). Pt endorses plan to walk while holding onto grocery cart and/or utilize cart in stores to get around. Discussed overall safety concerns with pt utilizing these techniques 2/2 high fall risk/enduranc deficits and visual deficits. PT recommended pt utilize custom light weight WC in stores for improved overall safety and independence. Pt ambulated with B UE on grocery cart x ~30 feet with +2 for safety. Pt demontrates improved understanding at end of session for why it is unsafe for her to use grocery cart at this time. Pt agreeable to using WC. Pt provides teach back of need to scan to L side of environment-however needed cues for implementation with WC mobility x150 feet with supervision.   Pt seated in WC at end of session with all needs wtihin reach and chair alarm on.    Therapy Documentation Precautions:   Precautions Precautions: Fall Recall of Precautions/Restrictions: Impaired Precaution/Restrictions Comments: L homonymous hemianopsia, L hemi, B TKA's pending revision per family Restrictions Weight Bearing Restrictions Per Provider Order: No  Therapy/Group: Individual Therapy  [facility] Doreene Orris, [place], DPT  02/11/2024, 12:49 PM

## 2024-02-11 NOTE — Progress Notes (Signed)
 Occupational Therapy Session Note  Patient Details  Name: Megan Fischer MRN: [id number] Date of Birth: [birth year]  Today's Date: 02/11/2024 OT Individual Time: 9199-9156 OT Individual Time Calculation (min): 43 min    Short Term Goals:  Week 3:  OT Short Term Goal 1 (Week 3): See LTG's secondary to LOS  Skilled Therapeutic Interventions/Progress Updates: Patient received resting in bed. Agreeable to OT treatment. Patient wanting to get in the shower this morning. Used the Sorrel with Min assist to get up from the EOB and perform shower bench transfers. Patient ADL levels listed below. Continue with skilled OT POC to improve independence with ADL's and reduce caregiver assist.     Therapy Documentation Precautions:  Precautions Precautions: Fall Recall of Precautions/Restrictions: Impaired Precaution/Restrictions Comments: L homonymous hemianopsia, L hemi, B TKA's pending revision per family Restrictions Weight Bearing Restrictions Per Provider Order: No General:   Vital Signs:   Pain:12/10   ADL: ADL Eating: Set up Where Assessed-Eating: Bed level Grooming: Setup Where Assessed-Grooming: Sitting at sink Upper Body Bathing: Set up assistance Where Assessed-Upper Body Bathing: shower Lower Body Bathing: Min assist Where Assessed-Lower Body Bathing:shower Upper Body Dressing: Mn assistance Where Assessed-Upper Body Dressing: w/c Lower Body Dressing: Mod assist Where Assessed-Lower Body Dressing: w/c Walk-In Shower Transfer: Investment banker, corporate Method: STEDY     Therapy/Group: Individual Therapy  [name] 02/11/2024, 12:03 PM

## 2024-02-11 NOTE — Progress Notes (Signed)
 Speech Language Pathology Daily Session Note  Patient Details  Name: Megan Fischer MRN: [id number] Date of Birth: 04-Apr-1951  Today's Date: 02/11/2024 SLP Individual Time: 1304-1400 SLP Individual Time Calculation (min): 56 min  Short Term Goals: Week 3: SLP Short Term Goal 1 (Week 3): STGs=LTGs d/t ELOS  Skilled Therapeutic Interventions: SLP conducted skilled therapy session targeting cognitive goals. SLP facilitated 2 card sorting tasks to target L inattention and organization.  Patient supervision to minA for organizing suits and even/odd numbers. SLP conducted written directions task to target working memory and L inattention. Patient required min to mod cues for working memory. Patient modA for L attention during card sorting and following written directions tasks. Patient was left in room with call bell in reach and alarm set. SLP will continue to target goals per plan of care.        Pain Pain Assessment Pain Scale: 0-10 Pain Score: 5   Therapy/Group: Individual Therapy  Ashley Ellin, M.A., CCC-SLP  Ashley A Ellin 02/11/2024, 2:19 PM

## 2024-02-11 NOTE — Progress Notes (Signed)
 PROGRESS NOTE   Subjective/Complaints:  Pt reports nerve pain in her L foot, ongoing since CVA.  L shoulder pain has been improved.   LBM yesterday.   ROS:   Pt denies SOB, abd pain, CP, N/V/C/D, and vision changes   +b/l arm soreness-improved     Objective:   No results found.   Recent Labs    02/11/24 0610  WBC 4.7  HGB 11.3*  HCT 35.1*  PLT 183     Recent Labs    02/11/24 0610  NA 139  K 4.2  CL 113*  CO2 18*  GLUCOSE 154*  BUN 45*  CREATININE 1.44*  CALCIUM  9.0    Intake/Output Summary (Last 24 hours) at 02/11/2024 1234 Last data filed at 02/11/2024 0800 Gross per 24 hour  Intake 240 ml  Output --  Net 240 ml        Physical Exam: Vital Signs Blood pressure (!) 129/58, pulse 75, temperature 98.4 F (36.9 C), temperature source Oral, resp. rate 16, height 5' 3 (1.6 m), weight 84.1 kg, SpO2 96%.     General: awake, alert, appropriate, NAD, sitting in WC HENT: conjugate gaze; oropharynx moist CV: regular rate and rhythm; no JVD Pulmonary: CTA B/L; non-labored breathing GI: soft, NT, ND, + Bowel sounds Psychiatric: appropriate  Ext: no clubbing, cyanosis, tr edema right lower extremity, 2+ left lower extremity -chronic for many years Psych: pleasant and cooperative Skin: Clean and intact without signs of breakdown on visible portion  Neuro: alert and awake,follows commands  MSK: healed TKA incisions, no joint abnormality noted TTP left foot.  pain with L shoulder ROM appears to be improved  R shoulder ROM limited- reports chronic    PRIOR EXAMS: Neuro: Alert and oriented x 4, follows simple commands, able to name and repeat, left homonymous hemianopsia-unchanged, right gaze preference able to cross midline.  Still has a lot of difficulty with visual scanning to the left.  Left facial weakness.  Right upper and lower extremity strength appear to be intact,Other than right  shoulder where she only ABduct said to about 40 or 50 degrees due to pain.  Left upper and lower extremity weakness 3-4 out of 5 other than left shoulder motor exam pain limited Prior neuro assessment is c/w today's exam 02/11/2024.   Assessment/Plan: 1. Functional deficits which require 3+ hours per day of interdisciplinary therapy in a comprehensive inpatient rehab setting. Physiatrist is providing close team supervision and 24 hour management of active medical problems listed below. Physiatrist and rehab team continue to assess barriers to discharge/monitor patient progress toward functional and medical goals  Care Tool:  Bathing    Body parts bathed by patient: Left arm, Chest, Front perineal area, Right upper leg, Left upper leg, Face, Right arm, Abdomen, Buttocks, Right lower leg, Left lower leg   Body parts bathed by helper: Buttocks     Bathing assist Assist Level: Minimal Assistance - Patient > 75%     Upper Body Dressing/Undressing Upper body dressing   What is the patient wearing?: Pull over shirt    Upper body assist Assist Level: Contact Guard/Touching assist    Lower Body Dressing/Undressing Lower body dressing  What is the patient wearing?: Pants, Incontinence brief     Lower body assist Assist for lower body dressing: Moderate Assistance - Patient 50 - 74%     Toileting Toileting    Toileting assist Assist for toileting: Total Assistance - Patient < 25%     Transfers Chair/bed transfer  Transfers assist     Chair/bed transfer assist level: Moderate Assistance - Patient 50 - 74%     Locomotion Ambulation   Ambulation assist      Assist level: 2 helpers Assistive device: Walker-rolling Max distance: 10'   Walk 10 feet activity   Assist     Assist level: 2 helpers Assistive device: Walker-rolling   Walk 50 feet activity   Assist Walk 50 feet with 2 turns activity did not occur: Safety/medical concerns  Assist level: 2  helpers (+2 for WC follow) Assistive device: Walker-rolling    Walk 150 feet activity   Assist Walk 150 feet activity did not occur: Safety/medical concerns (fatigue)         Walk 10 feet on uneven surface  activity   Assist Walk 10 feet on uneven surfaces activity did not occur: Safety/medical concerns         Wheelchair     Assist Is the patient using a wheelchair?: Yes Type of Wheelchair: Manual    Wheelchair assist level: Supervision/Verbal cueing Max wheelchair distance: 150'    Wheelchair 50 feet with 2 turns activity    Assist        Assist Level: Supervision/Verbal cueing   Wheelchair 150 feet activity     Assist      Assist Level: Supervision/Verbal cueing   Blood pressure (!) 129/58, pulse 75, temperature 98.4 F (36.9 C), temperature source Oral, resp. rate 16, height 5' 3 (1.6 m), weight 84.1 kg, SpO2 96%.  Medical Problem List and Plan: 1. 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  Antithrombotics: -DVT/anticoagulation:  Pharmaceutical: Eliquis              -antiplatelet therapy: ECASA 3. Pain Management: tylenol  prn. Voltaren  gel qid to knee.   -10/1 arm chest wall muscle soreness likely due to UE use with therapy. Robaxin  and lidocaine patch  -10/2 will order tramadol  as needed every 12 hours  -10/3 left shoulder x-ray without acute changes, narrowing of  subacromial interval but can be seen with rotator cuff pathology and evidence of arthritis noted.  If continues to be significant limitation could consider additional imaging.  Today she reports pain is improving so we will monitor for now.  10/4- no complaints of pain this AM except gas pain  10/6 consider starting gabapentin  for neuropathic pain, shoulder pain appears to be improved 4. Mood/Behavior/Sleep: LCSW to follow for evaluation and support.             --  Melatonin prn.              -antipsychotic agents: N/A 5. Neuropsych/cognition: This patient is capable of making decisions on her own behalf. 6. Skin/Wound Care: Routine pressure relief measures.  7. Fluids/Electrolytes/Nutrition: Monitor I/O. Check CMET in am.  8. A fib: Monitor HR TID--on metoprolol  --Eliquis  resumed 09/18, heart rate stable 9. HTN: Monitor BP TID--on amlodipine , Lotensin  and Metoprolol .  Avoid hypotension and with long-term goal normotensive             -Fair control continue to monitor, internal medicine suggests outpatient on thiazide with benazepril  combo pill to reduce pill burden  -9/20 BP mildly elevated, continue current regimen for now would like to avoid causing hypotension  Increase toprol  XL to 62.5mg  daily  -10/6 BP a little soft at times, will decrease amlodipine  to 5 mg daily    02/11/2024    5:12 AM 02/11/2024    4:05 AM 02/10/2024    7:42 PM  Vitals with BMI  Weight 185 lbs 7 oz    BMI 32.85    Systolic  129 122  Diastolic  58 57  Pulse  75 65      10. CKD: BUN/SCr 30/1.40 @ admission.  --Baseline likely 1.3-1.5 in the last few months.              --Recheck in am.              - 9/19 reorder CMP, initial lab draw hemolyzed  -9/22 BUN/Cr   elevated beyond baseline today but within her variance going back to June labs.  Jardiance  resumed 9/18 which should have helped. She is on lotensin , however. Consider holding if further trend up   -for now encourage fluids   -recheck BMET in  AM  -9/23 Cr down to 1.48, around her baseline, BUN a little higher at 35, continue to encourage oral fluids  -9/25 creatinine a little higher again today at at 1.61/BUN 33, encourage oral fluids, will hold Jardiance  until recheck tomorrow.  Not a lot of fluid intake recorded.  Discussed encouraging fluids with nursing  -9/26 creatinine/BUN much improved today to 1.36/29.  Continue oral fluid intake.  Start Jardiance   -9/29 creatinine up to 1.82, stop Jardiance . NS 50ml/hr for mild AKI  Addendum refusing IVF, encourage oral fluids  -9/30 creatinine much improved to 1.28/BUN 32 after encouraging oral fluids  -10/2 creatinine and BUN back up, will encourage fluids and asked nursing to do the same.  Recheck tomorrow  - 10/3 BUN and creatinine overall improved today, continue encourage fluid intake and recheck Monday  -10/6 BUN a little bit higher at 45, creatinine 1.44, continue encourage oral fluids 11. T2DM: Hgb A1c- 6.5 and well controlled. Was on Semaglutide  1 mg, Amaryl  1 mg and Jardiance  25 mg PTA             --Jardiance  resumed on 09/18 --Monitor BS ac/hs and use SSI for elevated BS -9/19-22  fair control continue to monitor trend  9/23 controlled, discussed with pharmacy will DC sliding scale and change CBGs to twice a day -9/24 controlled overall, decrease CBG to daily -9/25 controlled continue to monitor daily for now CBG stable today consider discontinue CBGs Discontinue CBGs -10/3 glucose remains stable on BMPs CBG (last 3)  No results for input(s): GLUCAP in the last 72 hours.     12. 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  13. Scoliosis/Spondylolisthesis: Has hx of chronic LBP w/BLE weakness --also endstage OA right knee--continue Diclofenac  gel qid  14. Hyperthyroid: continue Tapazole  5 mg daily.   15. Hyperlipidemia: LDL 123 w/goal < 70.  --continue Crestor  20 mg (was increased) and Zetia .    16. Constipation             Last BM 9/27, will decrease miralax  back to daily  9/29 continue current regimen and consider additional medication tomorrow if no BM  9/30 large bowel movement yesterday continue to monitor  10/1 LBM 9/29 consider additional medication tomorrow if no BM, sorbitol PRN  10/3 LBM 10/1 1 large BM and several smaller ones.  Continue current regimen for now  10/6 LBM yesterday  17. Hyperactive gas  10/4- will order Gasx 160 mg QID       LOS: 18 days A FACE TO FACE EVALUATION WAS PERFORMED  Murray Collier 02/11/2024, 12:34 PM

## 2024-02-12 NOTE — Plan of Care (Signed)
  Problem: Consults Goal: RH STROKE PATIENT EDUCATION Description: See Patient Education module for education specifics  Outcome: Progressing   Problem: RH BOWEL ELIMINATION Goal: RH STG MANAGE BOWEL WITH ASSISTANCE Description: STG Manage Bowel with mod I Assistance. Outcome: Progressing Goal: RH STG MANAGE BOWEL W/MEDICATION W/ASSISTANCE Description: STG Manage Bowel with Medication with mod I Assistance. Outcome: Progressing   Problem: RH BLADDER ELIMINATION Goal: RH STG MANAGE BLADDER WITH ASSISTANCE Description: STG Manage Bladder With toileting Assistance Outcome: Progressing   Problem: RH SAFETY Goal: RH STG ADHERE TO SAFETY PRECAUTIONS W/ASSISTANCE/DEVICE Description: STG Adhere to Safety Precautions With cues Assistance/Device. Outcome: Progressing   Problem: RH PAIN MANAGEMENT Goal: RH STG PAIN MANAGED AT OR BELOW PT'S PAIN GOAL Description: < 4 with prns Outcome: Progressing   Problem: RH KNOWLEDGE DEFICIT Goal: RH STG INCREASE KNOWLEDGE OF DIABETES Description: Patient and dtr will be able to manage DM using educational resources for medications and dietary modification independently Outcome: Progressing Goal: RH STG INCREASE KNOWLEDGE OF HYPERTENSION Description: Patient and dtr will be able to manage HTN using educational resources for medications and dietary modification independently Outcome: Progressing Goal: RH STG INCREASE KNOWLEDGE OF DYSPHAGIA/FLUID INTAKE Description: Patient and dtr will be able to manage Dysphagia using educational resources for medications and dietary modification independently Outcome: Progressing Goal: RH STG INCREASE KNOWLEGDE OF HYPERLIPIDEMIA Description: Patient and dtr will be able to manage HLD using educational resources for medications and dietary modification independently Outcome: Progressing Goal: RH STG INCREASE KNOWLEDGE OF STROKE PROPHYLAXIS Description: Patient and dtr will be able to manage secondary risks using  educational resources for medications and dietary modification independently Outcome: Progressing   Problem: RH Vision Goal: RH LTG Vision (Specify) Outcome: Progressing

## 2024-02-12 NOTE — Progress Notes (Signed)
 Physical Therapy Session Note  Patient Details  Name: Megan Fischer MRN: [id number] Date of Birth: [birth year]  Today's Date: 02/12/2024 PT Individual Time: 0800-0857, 1300-1405 PT Individual Time Calculation (min): 57 min, 65 min   Short Term Goals: Week 3:  PT Short Term Goal 1 (Week 3): STG=LTG 2/2 ELOS  Skilled Therapeutic Interventions/Progress Updates:      Treatment Session 1  Pt son present evening of 10/6. Followed up with pt son regarding DME: pt son endorses plan to get knock off steady however has not gotten it yet-and not sure if it will be delivered prior to D/C 10/10. Pt son reports he does not have a ramp for the mini van at this time and is looking into options. PT recommended [employer]. Education provided on pt requiring min A for stand pivot transfer to car simulator. PT recommended pt son attend therapy sessions as much as possible between now and D/C 10/10 for hands on family training. Reminded pt son of WC eval 10/7 at 1 and Family training scheduled 10/8 at 1-pt endorses plan to attend both. Pt son reports plan to attend PT AM sessions on 10/7 however pt no show during session this morning.   Pt performed stand pivot transfer throughout sesison with RW and heavy min A. PT performed mat assessment evaluation for LMN for custom light weight WC.   Pt seated in WC at end of session with all needs within reach and bed alarm on.   Treatment Session 2   Pt supine in bed upon arrival. Pt agreeable to therapy. Pt denies any pain.   Pt son present throughout session.   Pt performed stand pivot transfer bed to [facility] with RW and min A, verbal cues provided for sequencing/technique.   Pt transported dependent in [facility] for time/energy conservation to day room.   Pt and pt son present and participated in [facility] evaluation with Penne, representative from [employer]. Penne provided pt son with additional resources for ramp for mini van.   Utilized remainder of time to provide  education on stand pivot transfer. Pt performed stand pivot transfer WC to car simulator with RW and min A initially however required total A for fall recovery 2/2 near fall with L LE buckling. Utilized [employer] transfer for car simulator to [facility] for pt overall safety.   Pt son reports fear of doing stand pivot transfer with RW, and plans to utilize knock off STEADY. Pt son is planning to order this today if his wife has not all ready for reduced burden of care, and pt overall safety/consistency.   Education provided how to maneuver knock off steady for WC to bed transfer (as it will not have adjustable legs. Pt verbalized understanding and agreeable.).   Notified Child psychotherapist of need for non emergency medical transport.   Pt supine in bed with all needs within reach and bed alarm on.      Therapy Documentation Precautions:  Precautions Precautions: Fall Recall of Precautions/Restrictions: Impaired Precaution/Restrictions Comments: L homonymous hemianopsia, L hemi, B TKA's pending revision per family Restrictions Weight Bearing Restrictions Per Provider Order: No   Therapy/Group: Individual Therapy  [facility] [place], [place], DPT  02/12/2024, 7:27 AM

## 2024-02-12 NOTE — Progress Notes (Signed)
 Speech Language Pathology Daily Session Note  Patient Details  Name: Megan Fischer MRN: [id number] Date of Birth: Apr 08, 1951  Today's Date: 02/12/2024 SLP Individual Time: 0901-1000 SLP Individual Time Calculation (min): 59 min  Short Term Goals: Week 3: SLP Short Term Goal 1 (Week 3): STGs=LTGs d/t ELOS  Skilled Therapeutic Interventions:  Patient was seen in am to address cognitive re- training. Direct handoff from PT with pt alert and seated upright in WC. She was agreeable for session. Pt reporting excitement pending upcoming discharge from CIR program. When asked about identifying changes/ challenges she may face post discharge pt warranting mod A for awareness. SLP additionally challenging pt in prescription label interpretation. Pt completed task with 67% acc indep which did not improve with mod to max cues. In other minutes of session, SLP challenged pt in medication management and error awareness task while also addressing L inattention. Pt warranting mod consistent cues to attend to left visual field. She also warranted mod to max cues to identify errors in a schematic of a BID pill box. SLP recommending assistance for medication management tasks post discharge. In other minutes of session, SLP challenging pt in recall of therapy schedule. SLP reviewed remaining schedule and after a ~7 minute distracted delay she recalled information with 75% acc and mod A. At conclusion of session, pt was left upright in [facility] with call button within reach and chair alarm active. SLP to continue POC.   Pain Pain Assessment Pain Scale: 0-10 Pain Score: 0-No pain  Therapy/Group: Individual Therapy  [name] 02/12/2024, 9:51 AM

## 2024-02-12 NOTE — Progress Notes (Signed)
 PROGRESS NOTE   Subjective/Complaints: Patient reports her shoulder arm pain and left foot pain is doing better today.  Continues to have tingling in her left foot.  LBM 10/5 denies feeling constipated  ROS:   Pt denies SOB, abd pain, CP, N/V/C/D, and vision changes  + Left foot tingling-continued +b/l arm soreness-improved     Objective:   No results found.   Recent Labs    02/11/24 0610  WBC 4.7  HGB 11.3*  HCT 35.1*  PLT 183     Recent Labs    02/11/24 0610  NA 139  K 4.2  CL 113*  CO2 18*  GLUCOSE 154*  BUN 45*  CREATININE 1.44*  CALCIUM  9.0    Intake/Output Summary (Last 24 hours) at 02/12/2024 0845 Last data filed at 02/12/2024 0807 Gross per 24 hour  Intake 960 ml  Output --  Net 960 ml        Physical Exam: Vital Signs Blood pressure 127/71, pulse 86, temperature 98.2 F (36.8 C), resp. rate 16, height 5' 3 (1.6 m), weight 84.1 kg, SpO2 99%.     General: awake, alert, appropriate, NAD, sitting in WC HENT: conjugate gaze; oropharynx moist CV: regular rate and rhythm; no JVD Pulmonary: CTA B/L; non-labored breathing GI: soft, NT, ND, + Bowel sounds Psychiatric: appropriate  Ext: no clubbing, cyanosis, tr edema right lower extremity, 2+ left lower extremity -chronic for many years Psych: pleasant and cooperative Skin: Clean and intact without signs of breakdown on visible portion   MSK: healed TKA incisions, no joint abnormality noted Minimal tenderness to palpation over the left foot, minimal tenderness in her shoulders.  Minimal pain with left shoulder passive ROM R shoulder ROM limited- reports chronic Neuro: Alert and awake, follows commands, left upper and lower extremity weakness   Prior exam  Neuro: Alert and oriented x 4, follows simple commands, able to name and repeat, left homonymous hemianopsia-unchanged, right gaze preference able to cross midline.  Still has a  lot of difficulty with visual scanning to the left.  Left facial weakness.  Right upper and lower extremity strength appear to be intact,Other than right shoulder where she only ABduct said to about 40 or 50 degrees due to pain.  Left upper and lower extremity weakness 3-4 out of 5 other than left shoulder motor exam pain limited Prior neuro assessment is c/w today's exam 02/12/2024.   Assessment/Plan: 1. Functional deficits which require 3+ hours per day of interdisciplinary therapy in a comprehensive inpatient rehab setting. Physiatrist is providing close team supervision and 24 hour management of active medical problems listed below. Physiatrist and rehab team continue to assess barriers to discharge/monitor patient progress toward functional and medical goals  Care Tool:  Bathing    Body parts bathed by patient: Left arm, Chest, Front perineal area, Right upper leg, Left upper leg, Face, Right arm, Abdomen, Buttocks, Right lower leg, Left lower leg   Body parts bathed by helper: Buttocks     Bathing assist Assist Level: Minimal Assistance - Patient > 75%     Upper Body Dressing/Undressing Upper body dressing   What is the patient wearing?: Pull over shirt  Upper body assist Assist Level: Contact Guard/Touching assist    Lower Body Dressing/Undressing Lower body dressing      What is the patient wearing?: Pants, Incontinence brief     Lower body assist Assist for lower body dressing: Moderate Assistance - Patient 50 - 74%     Toileting Toileting    Toileting assist Assist for toileting: Minimal Assistance - Patient > 75%     Transfers Chair/bed transfer  Transfers assist     Chair/bed transfer assist level: Minimal Assistance - Patient > 75%     Locomotion Ambulation   Ambulation assist      Assist level: Minimal Assistance - Patient > 75% Assistive device: Walker-rolling Max distance: 20'   Walk 10 feet activity   Assist     Assist level:  Minimal Assistance - Patient > 75% Assistive device: Walker-rolling   Walk 50 feet activity   Assist Walk 50 feet with 2 turns activity did not occur: Safety/medical concerns  Assist level: 2 helpers (+2 for WC follow) Assistive device: Walker-rolling    Walk 150 feet activity   Assist Walk 150 feet activity did not occur: Safety/medical concerns         Walk 10 feet on uneven surface  activity   Assist Walk 10 feet on uneven surfaces activity did not occur: Safety/medical concerns         Wheelchair     Assist Is the patient using a wheelchair?: Yes Type of Wheelchair: Manual    Wheelchair assist level: Supervision/Verbal cueing Max wheelchair distance: 150'    Wheelchair 50 feet with 2 turns activity    Assist        Assist Level: Supervision/Verbal cueing   Wheelchair 150 feet activity     Assist      Assist Level: Supervision/Verbal cueing   Blood pressure 127/71, pulse 86, temperature 98.2 F (36.8 C), resp. rate 16, height 5' 3 (1.6 m), weight 84.1 kg, SpO2 99%.  Medical Problem List and Plan: 1. 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  Antithrombotics: -DVT/anticoagulation:  Pharmaceutical: Eliquis              -antiplatelet therapy: ECASA 3. Pain Management: tylenol  prn. Voltaren  gel qid to knee.   -10/1 arm chest wall muscle soreness likely due to UE use with therapy. Robaxin  and lidocaine patch  -10/2 will order tramadol  as needed every 12 hours  -10/3 left shoulder x-ray without acute changes, narrowing of subacromial interval but can be  seen with rotator cuff pathology and evidence of arthritis noted.  If continues to be significant limitation could consider additional imaging.  Today she reports pain is improving so we will monitor for now.  10/4- no complaints of pain this AM except gas pain  10/6 consider starting gabapentin  for neuropathic pain, shoulder pain appears to be improved  10/7 pain appears to be improved today, continue to monitor for now 4. Mood/Behavior/Sleep: LCSW  to follow for evaluation and support.             --Melatonin prn.              -antipsychotic agents: N/A 5. Neuropsych/cognition: This patient is capable of making decisions on her own behalf. 6. Skin/Wound Care: Routine pressure relief measures.  7. Fluids/Electrolytes/Nutrition: Monitor I/O. Check CMET in am.  8. A fib: Monitor HR TID--on metoprolol  --Eliquis  resumed 09/18, heart rate stable 9. HTN: Monitor BP TID--on amlodipine , Lotensin  and Metoprolol .  Avoid hypotension and with long-term goal normotensive             -Fair control continue to monitor, internal medicine suggests outpatient on thiazide with benazepril  combo pill to reduce pill burden  -9/20 BP mildly elevated, continue current regimen for now would like to avoid causing hypotension  Increase toprol  XL to 62.5mg  daily  -10/6 BP a little soft at times, will decrease amlodipine  to 5 mg daily  - 10/7 BP stable, continue to monitor response to medication change    02/12/2024    6:31 AM 02/12/2024    3:38 AM 02/11/2024    8:13 PM  Vitals with BMI  Weight  185 lbs 7 oz   BMI  32.85   Systolic 127  104  Diastolic 71  50  Pulse 86  71      10. CKD: BUN/SCr 30/1.40 @ admission.  --Baseline likely 1.3-1.5 in the last few months.              --Recheck in am.              - 9/19 reorder CMP, initial lab draw hemolyzed  -9/22 BUN/Cr   elevated beyond baseline today but within her variance going back to June labs.  Jardiance  resumed 9/18 which should have helped. She is on  lotensin , however. Consider holding if further trend up   -for now encourage fluids   -recheck BMET in AM  -9/23 Cr down to 1.48, around her baseline, BUN a little higher at 35, continue to encourage oral fluids  -9/25 creatinine a little higher again today at at 1.61/BUN 33, encourage oral fluids, will hold Jardiance  until recheck tomorrow.  Not a lot of fluid intake recorded.  Discussed encouraging fluids with nursing  -9/26 creatinine/BUN much improved today to 1.36/29.  Continue oral fluid intake.  Start Jardiance   -9/29 creatinine up to 1.82, stop Jardiance . NS 50ml/hr for mild AKI  Addendum refusing IVF, encourage oral fluids  -9/30 creatinine much improved to 1.28/BUN 32 after encouraging oral fluids  -10/2 creatinine and BUN back up, will encourage fluids and asked nursing to do the same.  Recheck tomorrow  - 10/3 BUN and creatinine overall improved today, continue encourage fluid intake and recheck Monday  -10/6 BUN a little bit higher at 45, creatinine 1.44, continue encourage oral fluids  10/7 fair fluid intake 960 mL recorded, continue to encourage 11. T2DM: Hgb A1c- 6.5 and well controlled. Was on Semaglutide  1 mg, Amaryl  1 mg and Jardiance  25 mg PTA             --Jardiance  resumed on 09/18 --Monitor BS ac/hs and use SSI for elevated BS -9/19-22  fair control continue to monitor trend  9/23 controlled, discussed with pharmacy will DC sliding scale and change CBGs to twice a day -9/24 controlled overall, decrease CBG to daily -9/25 controlled continue to monitor daily for now CBG stable today consider discontinue CBGs Discontinue CBGs -10/3 glucose remains stable  on BMPs CBG (last 3)  No results for input(s): GLUCAP in the last 72 hours.     12. 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  13. Scoliosis/Spondylolisthesis: Has hx of chronic LBP w/BLE weakness --also endstage OA right knee--continue  Diclofenac  gel qid  14. Hyperthyroid: continue Tapazole  5 mg daily.   15. Hyperlipidemia: LDL 123 w/goal < 70.  --continue Crestor  20 mg (was increased) and Zetia .   16. Constipation             Last BM 9/27, will decrease miralax  back to daily  9/29 continue current regimen and consider additional medication tomorrow if no BM  9/30 large bowel movement yesterday continue to monitor  10/1 LBM 9/29 consider additional medication tomorrow if no BM, sorbitol PRN  10/3 LBM 10/1 1 large BM and several smaller ones.  Continue current regimen for now  10/7 LBM on 10/5, consider additional medication tomorrow if no BM by then  17. Hyperactive gas  10/4- will order Gasx 160 mg QID       LOS: 19 days A FACE TO FACE EVALUATION WAS PERFORMED  Murray Collier 02/12/2024, 8:45 AM

## 2024-02-12 NOTE — Progress Notes (Signed)
 Occupational Therapy Session Note  Patient Details  Name: Megan Fischer MRN: [id number] Date of Birth: 11/30/1950  Today's Date: 02/12/2024 OT Individual Time: 1015-1045 OT Individual Time Calculation (min): 30 min    Short Term Goals: Week 3:  OT Short Term Goal 1 (Week 3): See LTG's secondary to LOS  Skilled Therapeutic Interventions/Progress Updates: Patient received sitting up in room. Agreeable to OT treatment. Assisted patient to therapy gym for FM NMRE. Worked on FM pinch and reach with the LUE. Patient cued to attend to left side past midline. Patient continued to have fair pinch but due to sensory impairment needed constant visual to maintain grasp. Patient reported fatigue at the end of the treatment session and requested to lie down to take a rest. Assisted with a squat pivot from w/c to bed with Min assist. Able to get LE's into the bed and transition to supine without assist. Continue with skilled OT POC.     Therapy Documentation Precautions:  Precautions Precautions: Fall Recall of Precautions/Restrictions: Impaired Precaution/Restrictions Comments: L homonymous hemianopsia, L hemi, B TKA's pending revision per family Restrictions Weight Bearing Restrictions Per Provider Order: No General:   Vital Signs:   Pain: Pain Assessment Pain Scale: 0-10 Pain Score: 0-No pain    Therapy/Group: Individual Therapy  [name] 02/12/2024, 12:15 PM

## 2024-02-12 NOTE — Progress Notes (Signed)
 Occupational Therapy Discharge Summary  Patient Details  Name: Megan Fischer MRN: [id number] Date of Birth: May 18, 1950  Date of Discharge from OT service:{Time; dates multiple:304500300}  {CHL IP REHAB OT TIME CALCULATIONS:304400400}   Patient has met {NUMBERS 0-12:18577} of {NUMBERS 0-12:18577} long term goals due to improved activity tolerance, improved balance, and improved awareness.  Patient to discharge at [facility] Assist level.  Patient's care partner is independent to provide the necessary physical and cognitive assistance at discharge.    Reasons goals not met: ***  Recommendation:  Patient will benefit from ongoing skilled OT services in home health setting to continue to advance functional skills in the area of BADL.  Equipment: No equipment provided  Reasons for discharge: treatment goals met and discharge from hospital  Patient/family agrees with progress made and goals achieved: Yes  OT Discharge Precautions/Restrictions  Precautions Precautions: Fall Recall of Precautions/Restrictions: Impaired Precaution/Restrictions Comments: L homonymous hemianopsia, L hemi, B TKA's pending revision per family Restrictions Weight Bearing Restrictions Per Provider Order: No General   Vital Signs   Pain Pain Assessment Pain Scale: 0-10 Pain Score: 3  Faces Pain Scale: Hurts little more Pain Location: Shoulder Pain Descriptors / Indicators: Aching;Discomfort Pain Onset: On-going ADL ADL Eating: Set up Where Assessed-Eating: Wheelchair Grooming: Setup Where Assessed-Grooming: Wheelchair, Sitting at sink Upper Body Bathing: Setup Where Assessed-Upper Body Bathing: Shower Lower Body Bathing: Minimal assistance Where Assessed-Lower Body Bathing: Shower Upper Body Dressing: Minimal assistance Where Assessed-Upper Body Dressing: Wheelchair Lower Body Dressing: Maximal assistance Where Assessed-Lower Body Dressing: Wheelchair Toileting: Moderate assistance Where  Assessed-Toileting: Teacher, adult education: Curator Method: Ambulance person: Grab bars, Drop arm bedside commode Tub/Shower Transfer: Unable to assess Tub/Shower Transfer Method: Unable to assess [employer] Transfer: Minimal assistance Film/video editor Method: Administrator: Emergency planning/management officer, Grab bars ADL Comments: Patient needing increased cues to stay on task and due to L field cut. Required some hand over hand and assist of 2 for safe ADL performance Vision Baseline Vision/History: 1 Wears glasses Patient Visual Report: Peripheral vision impairment;Blurring of vision Vision Assessment?: Yes Eye Alignment: Within Functional Limits Ocular Range of Motion: Restricted on the left Alignment/Gaze Preference: Gaze right Tracking/Visual Pursuits: Left eye does not track laterally;Requires cues, head turns, or add eye shifts to track Saccades: Overshoots;Additional eye shifts occurred during testing Convergence: Impaired (comment) Visual Fields: Left homonymous hemianopsia Perception  Perception: Impaired Praxis Praxis: Impaired Praxis Impairment Details: Motor planning Cognition Cognition Overall Cognitive Status: Impaired/Different from baseline Arousal/Alertness: Awake/alert Orientation Level: Person;Place;Situation Person: Oriented Place: Oriented Situation: Oriented Memory: Impaired Memory Impairment: Retrieval deficit Problem Solving: Impaired Sequencing: Impaired Organizing: Impaired Safety/Judgment: Impaired Sensation Sensation Light Touch: Impaired Detail Light Touch Impaired Details: Impaired LLE;Impaired LUE Hot/Cold: Appears Intact Proprioception: Impaired Detail Proprioception Impaired Details: Impaired LLE;Impaired LUE Stereognosis: Impaired by gross assessment Coordination Gross Motor Movements are Fluid and Coordinated: No Fine Motor Movements are Fluid and Coordinated:  No Coordination and Movement Description: L hemi with sensory defcits Motor  Motor Motor: Hemiplegia;Abnormal postural alignment and control Mobility  Bed Mobility Bed Mobility: Rolling Right;Rolling Left;Supine to Sit;Sit to Supine Rolling Right: Supervision/verbal cueing Rolling Left: Supervision/Verbal cueing Supine to Sit: Supervision/Verbal cueing Sit to Supine: Supervision/Verbal cueing Transfers Sit to Stand: Minimal Assistance - Patient > 75% Stand to Sit: Minimal Assistance - Patient > 75%  Trunk/Postural Assessment     Balance Dynamic Sitting Balance Dynamic Sitting - Balance Support: Feet supported;During functional activity Dynamic Sitting - Level  of Assistance: 5: Stand by assistance Dynamic Sitting - Balance Activities: Reaching across midline;Reaching for objects Static Standing Balance Static Standing - Balance Support: Bilateral upper extremity supported Static Standing - Level of Assistance: 4: Min assist Extremity/Trunk Assessment RUE Assessment RUE Assessment: Within Functional Limits Active Range of Motion (AROM) Comments: AROM limited to 75 degrees flexion due to OA changes and pain that increased throughout hospital admission LUE Assessment Passive Range of Motion (PROM) Comments: PROM/AAROM tolerated to 150 degrees without report of pain General Strength Comments: 4/5   [name] 02/12/2024, 12:42 PM

## 2024-02-12 NOTE — Progress Notes (Signed)
 Patient ID: Megan Fischer, female   DOB: January 28, 1951, 73 y.o.   MRN: [id number]  Emailed over CAP-DA referral to [facility] at [email or web address]. Copies of application left for family in room.   Ordered DME: Hospital bed and drop arm commode via [employer].   Set up transportation via [facility] at 3:30pm

## 2024-02-13 NOTE — Progress Notes (Signed)
 Speech Language Pathology Daily Session Note  Patient Details  Name: ASHLING ROANE MRN: [id number] Date of Birth: [birth year]  Today's Date: 02/13/2024 SLP Individual Time: 0730-0833 SLP Individual Time Calculation (min): 63 min  Short Term Goals: Week 3: SLP Short Term Goal 1 (Week 3): STGs=LTGs d/t ELOS  Skilled Therapeutic Interventions:   SLP conducted skilled therapy session targeting cognition goals. SLP facilitated tasks to target L inattention, organization, and problem solving. Patient min to modA for L inattention during pattern reconfiguration and functional medication tasks. Patient min to modA for organization and required increased processing time as sorting complexity increased for 2/2 patterns. ModA given for problem solving across both activities. Patient benefited from verbal cues and visual aids for error awareness. Patient was left in room with call bell in reach and alarm set. SLP will continue to target goals per plan of care.     Pain Pain Assessment Pain Scale: 0-10 Pain Score: 6  Pain Location: Shoulder  Therapy/Group: Individual Therapy  Ashley A Ellin 02/13/2024, 9:11 AM

## 2024-02-13 NOTE — Patient Care Conference (Signed)
 Inpatient RehabilitationTeam Conference and Plan of Care Update Date: 02/13/2024   Time: 09:35 AM    Patient Name: Megan Fischer      Medical Record Number: [id number]  Date of Birth: [birth year] Sex: Female         Room/Bed: 4W08C/4W08C-01 Payor Info: Payor: Advertising copywriter MEDICARE / Plan: DREMA DUAL COMPLETE / Product Type: *No Product type* /    Admit Date/Time:  01/24/2024  5:26 PM  Primary Diagnosis:  Acute ischemic right PCA stroke [facility])  Hospital Problems: Principal Problem:   Acute ischemic right PCA stroke [facility]) Active Problems:   Chronic kidney disease    Expected Discharge Date: Expected Discharge Date: 02/15/24  Team Members Present: Physician leading conference: Dr. Murray Collier Social Worker Present: Waverly Gentry, LCSW-A Nurse Present: Barnie Ronde, RN PT Present: Doreene Orris, PT OT Present: Delon Sharps, OT SLP Present: [name], SLP     Current Status/Progress Goal Weekly Team Focus  Bowel/Bladder     Continent of bowel and bladder           Swallow/Nutrition/ Hydration               ADL's   Setup-Min A for UB care; Mod-Max A for LB care & toileting. Max multimodal cuing to compensate for visual and proprioceptive deficits. Barriers: Decreased L-sided awareness, decreased awareness into deficits, decreased activity tolerance, decreased/painful BUE shoulder ROM.   Discontinued shower goal due to method of transfer (stedy), downgraded goals to Mod A overall.   Caregiver education as permitted, discharge planning (emphasis placed on sponge-bathing routine)    Mobility   bed mobility supervision, sit to stand min A with RW, stand pivot transfer overall min A with RW with one exception (near fall on 10/7). Gait up to 110 feet (however more consistenty ~30 feet with RW and min A). WC mobility with B UE/R LE and supervision.   goals: CGA/min A  D/C 10/10; DME: pt needs hospital bed. pt family purhcasing knock off steady WC  consult completed 10/7, will be delivered 10/8 in afternoon. Family training scheduled with son 10/8 at 1. Barriers to D/C: Pt had near fall 10/7 during family training with son 2/2 L LE buckling requiring total A for fall recovery. Pt family purchasing knock of steady for home. Plan to do non emergency medical transport home. Resources provided for pt son to puchase ramp for WC accessible van.    Communication                Safety/Cognition/ Behavioral Observations  minA for L visual attention, minA for sustained attention, S to min for problem solving and memory   supervision   cognitive re-training, pt/family education, functional cognitive tasks    Pain      Variable pain    Pain < 4 with prns    Pain addressed; assess need for and effectiveness of prn meds  Skin      Fluctuating edema of left lower extremity   Manage edema of left leg    Ace wrap to leg    Discharge Planning:  Plans to discharge home with daughter, Megan Fischer providing assistance as needed (limited due to fall) - Patients son, [name] Resources for HHA provided. Demographics faxed for wheelchair consultation. Son agreeable to family education on 10/8 between 1pm-4pm.   Team Discussion: Patient admitted post right PCA CVA with left inattention and fatigue. Progress limited by arthritis of right UE and  hemiparesis on left side post stroke and knee limitations, buckling; surgery pending. Functional status affected by poor proprioception. Needs cues for safety and min - mod assist for attention and memory as she is easily distracted, tangential and awareness interpretation.   Patient on target to meet rehab goals: yes, currently needs supervision for bed mobility and min assist for stand pivot transfers. Needs min assist to ambulate 110' using a RW. Needs min assist for upper body care and mod assist for lower body care with supervision for problem solving, but min  assist for visual attention and sustained attention.  *See Care Plan and progress notes for long and short-term goals.   Revisions to Treatment Plan:  Car transfers with fleeta   Teaching Needs: Safety, medications, dietary modification, transfers, toileting, etc.   Current Barriers to Discharge: Decreased caregiver support  Possible Resolutions to Barriers: Family education DME: hospital bed, Drop arm BSC, Stedy, wheelchair HH follow up services Non-emergent transportation to home    Medical Summary Current Status: R PCA CVA, consitpation, CKD, HTN, pain, Afib    Barriers to Discharge Comments: R PCA CVA, consitpation, CKD, HTN, pain, Afib Possible Resolutions to [employer] Focus: consider gabapentin , monitor bowwel function, recheck labs tomorrow, encouarge fluids   Continued Need for Acute Rehabilitation Level of Care: The patient requires daily medical management by a physician with specialized training in physical medicine and rehabilitation for the following reasons: Direction of a multidisciplinary physical rehabilitation program to maximize functional independence : Yes Medical management of patient stability for increased activity during participation in an intensive rehabilitation regime.: Yes Analysis of laboratory values and/or radiology reports with any subsequent need for medication adjustment and/or medical intervention. : Yes   I attest that I was present, lead the team conference, and concur with the assessment and plan of the team.   [name] 02/13/2024, 3:30 PM

## 2024-02-13 NOTE — Progress Notes (Signed)
 PROGRESS NOTE   Subjective/Complaints: Pain under control today.  No new complaints this morning.  Looking forward to going home soon.  LBM 10/7  ROS:   Pt denies fever, chills SOB, abd pain, CP, N/V/C/D, and vision changes  + Left foot tingling-continued but not particularly painful at this time +b/l arm soreness-improved     Objective:   No results found.   Recent Labs    02/11/24 0610  WBC 4.7  HGB 11.3*  HCT 35.1*  PLT 183     Recent Labs    02/11/24 0610  NA 139  K 4.2  CL 113*  CO2 18*  GLUCOSE 154*  BUN 45*  CREATININE 1.44*  CALCIUM  9.0    Intake/Output Summary (Last 24 hours) at 02/13/2024 0937 Last data filed at 02/13/2024 9176 Gross per 24 hour  Intake 960 ml  Output --  Net 960 ml        Physical Exam: Vital Signs Blood pressure 128/61, pulse 64, temperature 97.9 F (36.6 C), temperature source Oral, resp. rate 16, height 5' 3 (1.6 m), weight 87 kg, SpO2 100%.     General: awake, alert, appropriate, NAD, sitting in WC HENT: conjugate gaze; oropharynx moist CV: regular rate and rhythm; no JVD Pulmonary: CTA B/L; non-labored breathing GI: soft, NT, ND, + Bowel sounds Psychiatric: appropriate, pleasant  Ext: no clubbing, cyanosis, tr edema right lower extremity, 2+ left lower extremity -chronic for many years Psych: pleasant and cooperative Skin: Clean and intact without signs of breakdown on visible portion   MSK: healed TKA incisions, no joint abnormality noted Minimal tenderness to palpation over the left foot, minimal tenderness in her shoulders.  Minimal pain with left shoulder passive ROM  Neuro: Alert and awake, follows commands, left upper and lower extremity weakness.  Weakness of both shoulders-reports chronic due to arthritis   Prior exam  Neuro: Alert and oriented x 4, follows simple commands, able to name and repeat, left homonymous hemianopsia-unchanged,  right gaze preference able to cross midline.  Still has a lot of difficulty with visual scanning to the left.  Left facial weakness.  Right upper and lower extremity strength appear to be intact,Other than right shoulder where she only ABduct said to about 40 or 50 degrees due to pain.  Left upper and lower extremity weakness 3-4 out of 5 other than left shoulder motor exam pain limited Prior neuro assessment is c/w today's exam 02/13/2024.   Assessment/Plan: 1. Functional deficits which require 3+ hours per day of interdisciplinary therapy in a comprehensive inpatient rehab setting. Physiatrist is providing close team supervision and 24 hour management of active medical problems listed below. Physiatrist and rehab team continue to assess barriers to discharge/monitor patient progress toward functional and medical goals  Care Tool:  Bathing    Body parts bathed by patient: Left arm, Chest, Front perineal area, Right upper leg, Left upper leg, Face, Right arm, Abdomen, Buttocks, Right lower leg, Left lower leg   Body parts bathed by helper: Buttocks     Bathing assist Assist Level: Minimal Assistance - Patient > 75%     Upper Body Dressing/Undressing Upper body dressing   What is the  patient wearing?: Pull over shirt    Upper body assist Assist Level: Contact Guard/Touching assist    Lower Body Dressing/Undressing Lower body dressing      What is the patient wearing?: Pants, Incontinence brief     Lower body assist Assist for lower body dressing: Moderate Assistance - Patient 50 - 74%     Toileting Toileting Toileting Activity did not occur Press photographer and hygiene only): Refused  Toileting assist Assist for toileting: Minimal Assistance - Patient > 75%     Transfers Chair/bed transfer  Transfers assist     Chair/bed transfer assist level: Minimal Assistance - Patient > 75%     Locomotion Ambulation   Ambulation assist      Assist level: Minimal  Assistance - Patient > 75% Assistive device: Walker-rolling Max distance: 20'   Walk 10 feet activity   Assist     Assist level: Minimal Assistance - Patient > 75% Assistive device: Walker-rolling   Walk 50 feet activity   Assist Walk 50 feet with 2 turns activity did not occur: Safety/medical concerns  Assist level: 2 helpers (+2 for WC follow) Assistive device: Walker-rolling    Walk 150 feet activity   Assist Walk 150 feet activity did not occur: Safety/medical concerns         Walk 10 feet on uneven surface  activity   Assist Walk 10 feet on uneven surfaces activity did not occur: Safety/medical concerns         Wheelchair     Assist Is the patient using a wheelchair?: Yes Type of Wheelchair: Manual    Wheelchair assist level: Supervision/Verbal cueing Max wheelchair distance: 150'    Wheelchair 50 feet with 2 turns activity    Assist        Assist Level: Supervision/Verbal cueing   Wheelchair 150 feet activity     Assist      Assist Level: Supervision/Verbal cueing   Blood pressure 128/61, pulse 64, temperature 97.9 F (36.6 C), temperature source Oral, resp. rate 16, height 5' 3 (1.6 m), weight 87 kg, SpO2 100%.  Medical Problem List and Plan: 1. 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 Formulized individual treatment plan based on medical history, underlying problem and comorbidities.    2.  Antithrombotics: -DVT/anticoagulation:   Pharmaceutical: Eliquis              -antiplatelet therapy: ECASA 3. Pain Management: tylenol  prn. Voltaren  gel qid to knee.   -10/1 arm chest wall muscle soreness likely due to UE use with therapy. Robaxin  and lidocaine patch  -10/2 will order tramadol  as needed every 12 hours  -10/3 left shoulder x-ray without acute changes, narrowing of subacromial interval but can be seen with rotator cuff pathology and evidence of arthritis noted.  If continues to be significant limitation could consider additional imaging.  Today she  reports pain is improving so we will monitor for now.  10/4- no complaints of pain this AM except gas pain  10/6 consider starting gabapentin  for neuropathic pain, shoulder pain appears to be improved  10/7-8 pain appears to be improved today, continue to monitor for now 4. Mood/Behavior/Sleep: LCSW to follow for evaluation and support.             --Melatonin prn.              -antipsychotic agents: N/A 5. Neuropsych/cognition: This patient is capable of making decisions on her own behalf. 6. Skin/Wound Care: Routine pressure relief measures.  7. Fluids/Electrolytes/Nutrition: Monitor I/O. Check CMET in am.  8. A fib: Monitor HR TID--on metoprolol  --Eliquis  resumed 09/18, heart rate stable 9. HTN: Monitor BP TID--on amlodipine , Lotensin  and Metoprolol .  Avoid hypotension and with long-term goal normotensive             -Fair control continue to monitor, internal medicine suggests outpatient on thiazide with benazepril  combo pill to reduce pill burden  -9/20 BP mildly elevated, continue current regimen for now would like to avoid causing hypotension  Increase toprol  XL to 62.5mg  daily  -10/6 BP a little soft at times, will decrease amlodipine  to 5 mg daily  - 10/8 BP intermittently soft, will dc amlodipine  and monitor BP off this medication     02/13/2024    4:56 AM 02/13/2024    3:54 AM 02/12/2024    8:31 PM  Vitals with BMI  Weight 191 lbs 13 oz 185 lbs 7 oz   BMI  33.98 32.85   Systolic 128  118  Diastolic 61  55  Pulse 64  69      10. CKD: BUN/SCr 30/1.40 @ admission.  --Baseline likely 1.3-1.5 in the last few months.              --Recheck in am.              - 9/19 reorder CMP, initial lab draw hemolyzed  -9/22 BUN/Cr   elevated beyond baseline today but within her variance going back to June labs.  Jardiance  resumed 9/18 which should have helped. She is on lotensin , however. Consider holding if further trend up   -for now encourage fluids   -recheck BMET in AM  -9/23 Cr down to 1.48, around her baseline, BUN a little higher at 35, continue to encourage oral fluids  -9/25 creatinine a little higher again today at at 1.61/BUN 33, encourage oral fluids, will hold Jardiance  until recheck tomorrow.  Not a lot of fluid intake recorded.  Discussed encouraging fluids with nursing  -9/26 creatinine/BUN much improved today to 1.36/29.  Continue oral fluid intake.  Start Jardiance   -9/29 creatinine up to 1.82, stop Jardiance . NS 50ml/hr for mild AKI  Addendum refusing IVF, encourage oral fluids  -9/30 creatinine much improved to 1.28/BUN 32 after encouraging oral fluids  -10/2 creatinine and BUN back up, will encourage fluids and asked nursing to do the same.  Recheck tomorrow  - 10/3 BUN and creatinine overall improved today, continue encourage fluid intake and recheck Monday  -10/6 BUN a little bit higher at 45, creatinine 1.44, continue encourage oral fluids  10/7 fair fluid intake 960 mL recorded, continue to encourage  Recheck labs tomorrow  11. T2DM: Hgb A1c- 6.5 and well controlled. Was on Semaglutide  1 mg, Amaryl  1 mg and Jardiance  25 mg PTA             --Jardiance  resumed  on 09/18 --Monitor BS ac/hs and use SSI for elevated BS -9/19-22  fair control continue to monitor trend  9/23 controlled, discussed with pharmacy will DC sliding scale and change CBGs to twice a day -9/24 controlled overall, decrease CBG to daily -9/25 controlled continue to  monitor daily for now CBG stable today consider discontinue CBGs Discontinue CBGs -10/3 glucose remains stable on BMPs Recheck on BMP tomorrow CBG (last 3)  No results for input(s): GLUCAP in the last 72 hours.     12. 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  13. Scoliosis/Spondylolisthesis: Has hx of chronic LBP w/BLE weakness --also endstage OA right knee--continue Diclofenac  gel qid  14. Hyperthyroid: continue Tapazole  5 mg daily.   15. Hyperlipidemia: LDL 123 w/goal < 70.  --continue Crestor  20 mg (was increased) and Zetia .   16. Constipation             Last BM 9/27, will decrease miralax  back to daily  9/29 continue current regimen and consider additional medication tomorrow if no BM  9/30 large bowel movement yesterday continue to monitor  10/1 LBM 9/29 consider additional medication tomorrow if no BM, sorbitol PRN  10/3 LBM 10/1 1 large BM and several smaller ones.  Continue current regimen for now  10/8 LBM yesterday, improved  17. Hyperactive gas  10/4- will order Gasx 160 mg QID       LOS: 20 days A FACE TO FACE EVALUATION WAS PERFORMED  Murray Collier 02/13/2024, 9:37 AM

## 2024-02-13 NOTE — Plan of Care (Signed)
  Problem: Consults Goal: RH STROKE PATIENT EDUCATION Description: See Patient Education module for education specifics  Outcome: Progressing   Problem: RH BOWEL ELIMINATION Goal: RH STG MANAGE BOWEL WITH ASSISTANCE Description: STG Manage Bowel with mod I Assistance. Outcome: Progressing Goal: RH STG MANAGE BOWEL W/MEDICATION W/ASSISTANCE Description: STG Manage Bowel with Medication with mod I Assistance. Outcome: Progressing   Problem: RH BLADDER ELIMINATION Goal: RH STG MANAGE BLADDER WITH ASSISTANCE Description: STG Manage Bladder With toileting Assistance Outcome: Progressing   Problem: RH SAFETY Goal: RH STG ADHERE TO SAFETY PRECAUTIONS W/ASSISTANCE/DEVICE Description: STG Adhere to Safety Precautions With cues Assistance/Device. Outcome: Progressing   Problem: RH PAIN MANAGEMENT Goal: RH STG PAIN MANAGED AT OR BELOW PT'S PAIN GOAL Description: < 4 with prns Outcome: Progressing   Problem: RH KNOWLEDGE DEFICIT Goal: RH STG INCREASE KNOWLEDGE OF DIABETES Description: Patient and dtr will be able to manage DM using educational resources for medications and dietary modification independently Outcome: Progressing Goal: RH STG INCREASE KNOWLEDGE OF HYPERTENSION Description: Patient and dtr will be able to manage HTN using educational resources for medications and dietary modification independently Outcome: Progressing Goal: RH STG INCREASE KNOWLEDGE OF DYSPHAGIA/FLUID INTAKE Description: Patient and dtr will be able to manage Dysphagia using educational resources for medications and dietary modification independently Outcome: Progressing Goal: RH STG INCREASE KNOWLEGDE OF HYPERLIPIDEMIA Description: Patient and dtr will be able to manage HLD using educational resources for medications and dietary modification independently Outcome: Progressing Goal: RH STG INCREASE KNOWLEDGE OF STROKE PROPHYLAXIS Description: Patient and dtr will be able to manage secondary risks using  educational resources for medications and dietary modification independently Outcome: Progressing   Problem: RH Vision Goal: RH LTG Vision (Specify) Outcome: Progressing

## 2024-02-13 NOTE — Progress Notes (Signed)
 Physical Therapy Session Note  Patient Details  Name: Megan Fischer MRN: [id number] Date of Birth: 1951/01/23  Today's Date: 02/13/2024 PT Individual Time: 1300-1357 PT Individual Time Calculation (min): 57 min   Short Term Goals: Week 3:  PT Short Term Goal 1 (Week 3): STG=LTG 2/2 ELOS  Skilled Therapeutic Interventions/Progress Updates:      Pt supine in bed upon arrival. Pt agreeable to therapy. Pt denies any pain.   Pt son not present for family training. Contacted pt son during session and he endorses inability to make it as he is in [place] with the hopes of getting a ramp for his fleeta. Pt and pt son deny any questions or concerns regarding discharge 10/10 at this time. Pt son endorses he believes the knock off steady was delivered to the house today.   Pt performed stand pivot transfer x2 throughout session with RW and min A, verbal cues provided for L LE positioning --no evidence of L LE buckling.   Pt self propelled WC throughout gift shop with B UE and R LE with supervision/min A, verbal cues provided for obstacle negotiation and attention to L visual field.   Pt supine in bed at end of session with all needs within reach and bed alarm on.    Therapy Documentation Precautions:  Precautions Precautions: Fall Recall of Precautions/Restrictions: Impaired Precaution/Restrictions Comments: L homonymous hemianopsia, L hemi, B TKA's pending revision per family Restrictions Weight Bearing Restrictions Per Provider Order: No  Therapy/Group: Individual Therapy  [facility] Doreene Orris, [place], DPT  02/13/2024, 7:34 AM

## 2024-02-13 NOTE — Progress Notes (Signed)
 Occupational Therapy Session Note  Patient Details  Name: ORAH SONNEN MRN: [id number] Date of Birth: 28-Feb-1951  Today's Date: 02/13/2024 OT Individual Time: 9094-8984 OT Individual Time Calculation (min): 70 min    Short Term Goals: Week 3:  OT Short Term Goal 1 (Week 3): See LTG's secondary to LOS  Skilled Therapeutic Interventions/Progress Updates:   Pt greeted resting in bed, no reports of pain. Transition to EOB with supervision. Squat-pivot from EOB>WC>TTB in shower stall with Min A (x2). Pt doffs LB garments with lateral leans and Max A and UB garments with light Min A. Pt bathes with assistance to thoroughly bathe/rinse posterior periarea, CGA for reaching towards B distal LE. Plan to introduce long-handled sponge. Slide-board transfer performed towards L-side with Mod A (x2). Stedy utilized for LB dressing due to time constraints and increased fatigue. Max multimodal cuing provided for the above tasks to manage visual and perceptual deficits. Pt remained resting in bed with all immediate needs met.   Therapy Documentation Precautions:  Precautions Precautions: Fall Recall of Precautions/Restrictions: Impaired Precaution/Restrictions Comments: L homonymous hemianopsia, L hemi, B TKA's pending revision per family, R UE arthritis Restrictions Weight Bearing Restrictions Per Provider Order: No  Nereida Habermann, OTR/L, MSOT  02/13/2024, 7:54 AM

## 2024-02-13 NOTE — Progress Notes (Signed)
 Physical Therapy Discharge Summary  Patient Details  Name: SHERRYANN FRESE MRN: [id number] Date of Birth: 1950/07/14  Date of Discharge from PT service:February 13, 2024   Patient has met 6 of 6 long term goals due to improved activity tolerance, improved balance, improved postural control, increased strength, increased range of motion, decreased pain, ability to compensate for deficits, improved attention, improved awareness, and improved coordination.  Patient to discharge at a wheelchair level supervision/min A.   Patient's care partner is independent to provide the necessary physical and cognitive assistance at discharge.  Recommendation:  Patient will benefit from ongoing skilled PT services in home health setting to continue to advance safe functional mobility, address ongoing impairments in strength, ROM, balance, endurance, gait, and minimize fall risk.  Equipment: Custom WC, hospital bed,  pt family purchasing steady, pt family has WC accessible van  Reasons for discharge: treatment goals met and discharge from hospital  Patient/family agrees with progress made and goals achieved: Yes  PT Discharge Precautions/Restrictions Precautions Precautions: Fall Recall of Precautions/Restrictions: Impaired Precaution/Restrictions Comments: L homonymous hemianopsia, L hemi, B TKA's pending revision per family, R UE arthritis Restrictions Weight Bearing Restrictions Per Provider Order: No Pain Interference Pain Interference Pain Effect on Sleep: 1. Rarely or not at all Pain Interference with Therapy Activities: 2. Occasionally Pain Interference with Day-to-Day Activities: 1. Rarely or not at all Vision/Perception  Vision - History Ability to See in Adequate Light: 1 Impaired Vision - Assessment Eye Alignment: Within Functional Limits Ocular Range of Motion: Restricted on the left Alignment/Gaze Preference: Gaze right Tracking/Visual Pursuits: Left eye does not track  laterally;Requires cues, head turns, or add eye shifts to track Saccades: Overshoots;Additional eye shifts occurred during testing Convergence: Impaired (comment) Perception Perception: Impaired Preception Impairment Details: Inattention/Neglect Perception-Other Comments: L inattention Praxis Praxis: Impaired Praxis Impairment Details: Motor planning  Cognition Overall Cognitive Status: Impaired/Different from baseline Arousal/Alertness: Awake/alert Orientation Level: Oriented X4 Memory: Impaired Memory Impairment: Retrieval deficit Problem Solving: Impaired Sequencing: Impaired Safety/Judgment: Impaired Sensation Sensation Light Touch: Impaired Detail Light Touch Impaired Details: Impaired LLE;Impaired LUE Hot/Cold: Appears Intact Proprioception: Impaired Detail Proprioception Impaired Details: Impaired LLE;Impaired LUE Stereognosis: Impaired by gross assessment Coordination Gross Motor Movements are Fluid and Coordinated: No Fine Motor Movements are Fluid and Coordinated: No Coordination and Movement Description: L hemi with sensory defcits and proprioceptive deficits Motor  Motor Motor: Hemiplegia;Abnormal postural alignment and control  Mobility Bed Mobility Bed Mobility: Rolling Right;Rolling Left;Supine to Sit;Sit to Supine Rolling Right: Supervision/verbal cueing Rolling Left: Supervision/Verbal cueing Supine to Sit: Supervision/Verbal cueing Sit to Supine: Supervision/Verbal cueing Transfers Transfers: Sit to Stand;Stand to Sit;Stand Pivot Transfers Sit to Stand: Minimal Assistance - Patient > 75% Stand to Sit: Minimal Assistance - Patient > 75% Stand Pivot Transfers: Minimal Assistance - Patient > 75%;Moderate Assistance - Patient 50 - 74% Stand Pivot Transfer Details: Tactile cues for initiation;Tactile cues for weight shifting;Verbal cues for technique;Verbal cues for gait pattern;Verbal cues for precautions/safety;Verbal cues for safe use of DME/AE;Manual  facilitation for weight shifting;Visual cues/gestures for precautions/safety;Visual cues/gestures for sequencing Squat Pivot Transfers: Minimal Assistance - Patient > 75% Transfer (Assistive device): Rolling walker  Locomotion  Gait Ambulation: Yes Gait Assistance: Minimal Assistance - Patient > 75% Gait Distance (Feet): 110 Feet Assistive device: Rolling walker Gait Assistance Details: Tactile cues for initiation;Tactile cues for weight shifting;Tactile cues for sequencing;Tactile cues for posture;Verbal cues for technique;Verbal cues for gait pattern;Manual facilitation for weight shifting;Verbal cues for safe use of DME/AE;Verbal cues for precautions/safety;Manual facilitation for placement Gait  Gait: Yes Gait Pattern: Impaired Gait Pattern: Step-through pattern;Decreased weight shift to right;Trunk flexed;Poor foot clearance - left;Narrow base of support;Left genu recurvatum;Decreased dorsiflexion - left;Decreased hip/knee flexion - left;Decreased stance time - left Gait velocity: decreased Stairs / Additional Locomotion Stairs: No Wheelchair Mobility Wheelchair Mobility: Yes Wheelchair Assistance: Doctor, general practice: Both upper extremities;Right lower extremity Wheelchair Parts Management: Needs assistance Distance: 150  Trunk/Postural Assessment  Cervical Assessment Cervical Assessment: Exceptions to [facility] (forward head) Thoracic Assessment Thoracic Assessment: Exceptions to [facility] (rounded shoulders) Lumbar Assessment Lumbar Assessment: Exceptions to [facility] (posterior tilt in sitting) Postural Control Postural Control: Deficits on evaluation Trunk Control: posterior bias Righting Reactions: delayed  Balance Balance Balance Assessed: Yes Dynamic Sitting Balance Dynamic Sitting - Balance Support: Feet supported;During functional activity Dynamic Sitting - Level of Assistance: 5: Stand by assistance Dynamic Sitting - Balance Activities: Reaching across  midline;Reaching for objects Static Standing Balance Static Standing - Balance Support: Bilateral upper extremity supported Static Standing - Level of Assistance: 4: Min assist Dynamic Standing Balance Dynamic Standing - Balance Support: During functional activity;Bilateral upper extremity supported Dynamic Standing - Level of Assistance: 4: Min assist;3: Mod assist Extremity Assessment  RLE Assessment RLE Assessment: Within Functional Limits LLE Assessment LLE Assessment: Exceptions to [facility] LLE Strength LLE Overall Strength: Deficits Left Hip Flexion: 2/5 Left Hip ABduction: 2/5 Left Hip ADduction: 2/5 Left Knee Flexion: 3/5 Left Knee Extension: 3/5 Left Ankle Dorsiflexion: 3/5 Left Ankle Plantar Flexion: 2+/5   Doreene Consuela Doreene Consuela, PT, DPT  02/13/2024, 7:52 AM

## 2024-02-14 ENCOUNTER — Other Ambulatory Visit (HOSPITAL_COMMUNITY): Payer: Self-pay

## 2024-02-14 ENCOUNTER — Encounter: Payer: Self-pay | Admitting: Hematology

## 2024-02-14 LAB — CBC
HCT: 36 % (ref 36.0–46.0)
Hemoglobin: 11.6 g/dL — ABNORMAL LOW (ref 12.0–15.0)
MCH: 29 pg (ref 26.0–34.0)
MCHC: 32.2 g/dL (ref 30.0–36.0)
MCV: 90 fL (ref 80.0–100.0)
Platelets: 187 K/uL (ref 150–400)
RBC: 4 MIL/uL (ref 3.87–5.11)
RDW: 14.5 % (ref 11.5–15.5)
WBC: 3.9 K/uL — ABNORMAL LOW (ref 4.0–10.5)
nRBC: 0 % (ref 0.0–0.2)

## 2024-02-14 LAB — BASIC METABOLIC PANEL WITH GFR
Anion gap: 9 (ref 5–15)
BUN: 34 mg/dL — ABNORMAL HIGH (ref 8–23)
CO2: 22 mmol/L (ref 22–32)
Calcium: 9.3 mg/dL (ref 8.9–10.3)
Chloride: 107 mmol/L (ref 98–111)
Creatinine, Ser: 1.21 mg/dL — ABNORMAL HIGH (ref 0.44–1.00)
GFR, Estimated: 48 mL/min — ABNORMAL LOW (ref >60)
Glucose, Bld: 186 mg/dL — ABNORMAL HIGH (ref 70–99)
Potassium: 4.1 mmol/L (ref 3.5–5.1)
Sodium: 138 mmol/L (ref 135–145)

## 2024-02-14 LAB — GLUCOSE, CAPILLARY: Glucose-Capillary: 203 mg/dL — ABNORMAL HIGH (ref 70–99)

## 2024-02-14 MED ORDER — METOPROLOL SUCCINATE ER 50 MG PO TB24
50.0000 mg | ORAL_TABLET | Freq: Every day | ORAL | Status: DC
  Administered 2024-02-15: 50 mg via ORAL
  Filled 2024-02-14: qty 1

## 2024-02-14 MED ORDER — METHOCARBAMOL 500 MG PO TABS
500.0000 mg | ORAL_TABLET | Freq: Four times a day (QID) | ORAL | 0 refills | Status: DC | PRN
  Filled 2024-02-14: qty 30, 8d supply, fill #0

## 2024-02-14 MED ORDER — SENNOSIDES-DOCUSATE SODIUM 8.6-50 MG PO TABS
2.0000 | ORAL_TABLET | Freq: Every day | ORAL | 0 refills | Status: DC
  Filled 2024-02-14: qty 60, 30d supply, fill #0

## 2024-02-14 MED ORDER — AMLODIPINE BESYLATE 2.5 MG PO TABS
2.5000 mg | ORAL_TABLET | Freq: Every day | ORAL | 0 refills | Status: DC
  Filled 2024-02-14: qty 30, 30d supply, fill #0

## 2024-02-14 MED ORDER — POLYETHYLENE GLYCOL 3350 17 GM/SCOOP PO POWD
17.0000 g | Freq: Every day | ORAL | 0 refills | Status: DC
  Filled 2024-02-14: qty 238, 14d supply, fill #0

## 2024-02-14 MED ORDER — LIDOCAINE 5 % EX PTCH
2.0000 | MEDICATED_PATCH | CUTANEOUS | 0 refills | Status: AC
  Filled 2024-02-14: qty 60, 30d supply, fill #0

## 2024-02-14 MED ORDER — DICLOFENAC SODIUM 1 % EX GEL
2.0000 g | Freq: Four times a day (QID) | CUTANEOUS | 0 refills | Status: DC
  Filled 2024-02-14: qty 100, 13d supply, fill #0

## 2024-02-14 MED ORDER — SIMETHICONE 80 MG PO CHEW
160.0000 mg | CHEWABLE_TABLET | Freq: Four times a day (QID) | ORAL | 0 refills | Status: DC
  Filled 2024-02-14: qty 120, 15d supply, fill #0

## 2024-02-14 MED ORDER — TRAMADOL HCL 50 MG PO TABS
50.0000 mg | ORAL_TABLET | Freq: Four times a day (QID) | ORAL | 0 refills | Status: DC | PRN
  Filled 2024-02-14: qty 20, 5d supply, fill #0

## 2024-02-14 MED ORDER — FAMOTIDINE 20 MG PO TABS
20.0000 mg | ORAL_TABLET | Freq: Every day | ORAL | 0 refills | Status: DC
  Filled 2024-02-14: qty 30, 30d supply, fill #0

## 2024-02-14 MED ORDER — ROSUVASTATIN CALCIUM 20 MG PO TABS
20.0000 mg | ORAL_TABLET | Freq: Every day | ORAL | 0 refills | Status: DC
  Filled 2024-02-14: qty 30, 30d supply, fill #0

## 2024-02-14 MED ORDER — AMLODIPINE BESYLATE 2.5 MG PO TABS
2.5000 mg | ORAL_TABLET | Freq: Every day | ORAL | Status: DC
  Administered 2024-02-14 – 2024-02-15 (×2): 2.5 mg via ORAL
  Filled 2024-02-14 (×2): qty 1

## 2024-02-14 MED ORDER — ASPIRIN 81 MG PO TBEC
81.0000 mg | DELAYED_RELEASE_TABLET | Freq: Every day | ORAL | 0 refills | Status: DC
  Filled 2024-02-14: qty 30, 30d supply, fill #0

## 2024-02-14 MED ORDER — GABAPENTIN 100 MG PO CAPS
100.0000 mg | ORAL_CAPSULE | Freq: Three times a day (TID) | ORAL | 0 refills | Status: DC
  Filled 2024-02-14: qty 90, 30d supply, fill #0

## 2024-02-14 NOTE — Progress Notes (Signed)
 Speech Language Pathology Discharge Summary  Patient Details  Name: Megan Fischer MRN: [id number] Date of Birth: 05/29/1950  Date of Discharge from SLP service:February 14, 2024  Patient has met 3 of 3 long term goals.  Patient to discharge at overall Supervision;Min level.  Reasons goals not met: n/a   Clinical Impression/Discharge Summary:   Patient has made consistent progress across therapy stay meeting 3/3 goals set for duration of admission on SLP caseload. Patient is discharging at an overall need for supervision to min assist for memory, sustained attention, and basic problem solving. She does require up to mod assist for new learning, perception/L inattention, and anticipatory awareness, and she would benefit from continuing SLP services at next venue of care to target aforementioned lingering deficits in order to reduce caregiver burden and maximize independence. Patient and family education complete. SLP will sign off.  Care Partner:  Caregiver Able to Provide Assistance: Yes  Type of Caregiver Assistance: Cognitive  Recommendation:  Outpatient SLP;Home Health SLP;24 hour supervision/assistance  Rationale for SLP Follow Up: Maximize cognitive function and independence   Equipment: n/a   Reasons for discharge: Discharged from hospital   Patient/Family Agrees with Progress Made and Goals Achieved: Yes   Hansel Devan, M.A., CCC-SLP   Ilya Ess A Delesia Martinek 02/14/2024, 1:19 PM

## 2024-02-14 NOTE — Progress Notes (Signed)
 Physical Therapy Session Note  Patient Details  Name: Megan Fischer MRN: [id number] Date of Birth: 02/19/1951  Today's Date: 02/14/2024 PT Individual Time: 1420-1515  PT Individual Time Calculation (min): 55 min  Short Term Goals: Week 3:  PT Short Term Goal 1 (Week 3): STG=LTG 2/2 ELOS  Skilled Therapeutic Interventions/Progress Updates:  Chart reviewed and pt agreeable to therapy. Pt received semi-reclined in bed with 0/10 c/o pain. Session focused on review of function as outlined in discharge note. Pt also completed SPT between bed <> WC using minA + RW.  Pt also completed education on care continuum, role of continued rehab after CIR d/c, fall prevention in the home, and WC management. At end of session, pt was left semi-reclined in bed with alarm engaged, nurse call bell and all needs in reach.     Therapy Documentation Precautions:  Precautions Precautions: Fall Recall of Precautions/Restrictions: Impaired Precaution/Restrictions Comments: L homonymous hemianopsia, L hemi, B TKA's pending revision per family, R UE arthritis Restrictions Weight Bearing Restrictions Per Provider Order: No General:      Therapy/Group: Individual Therapy   Megan Fischer 02/14/2024, 3:19 PM

## 2024-02-14 NOTE — Progress Notes (Signed)
 Speech Language Pathology Daily Session Note  Patient Details  Name: Megan Fischer MRN: [id number] Date of Birth: 29-Jul-1950  Today's Date: 02/14/2024 SLP Individual Time: 1300-1400 SLP Individual Time Calculation (min): 60 min  Short Term Goals: Week 3: SLP Short Term Goal 1 (Week 3): STGs=LTGs d/t ELOS  Skilled Therapeutic Interventions: SLP conducted skilled therapy session targeting cognition goals. SLP facilitated novel card game task targeting working memory, new learning, sustained attention, and matching/organization. Patient benefited from consistent verbal cues throughout to assist with new rule recall but sustained attention with just s cues and matched and organized cards with supervision. Near end of session, patient endorsed urge to void, was transported to commode via [name] and was continent of bladder. During transit patient required min assist for safety awareness and sequencing. Patient was left in room with call bell in reach and alarm set. SLP will continue to target goals per plan of care.        Pain Pain Assessment Pain Scale: 0-10 Pain Score: 6  Pain Type: Chronic pain Pain Location: Shoulder Pain Orientation: Left;Right Pain Descriptors / Indicators: Aching;Discomfort Pain Frequency: Intermittent Pain Onset: On-going Pain Intervention(s): Medication (See eMAR)  Therapy/Group: Individual Therapy  Megan Fischer, M.A., CCC-SLP  Megan Fischer 02/14/2024, 2:15 PM

## 2024-02-14 NOTE — Progress Notes (Signed)
 PROGRESS NOTE   Subjective/Complaints: Excited about going home tomorrow.  Pain well-controlled.  LBM today.  No additional concerns or complaints.  ROS:   Pt denies fever, chills SOB, abd pain, CP, N/V/C/D, and vision changes.  Denies any new motor or sensory changes  + Left foot tingling-continued butdenies pain in this area    Objective:   No results found.   Recent Labs    02/14/24 0538  WBC 3.9*  HGB 11.6*  HCT 36.0  PLT 187     Recent Labs    02/14/24 0538  NA 138  K 4.1  CL 107  CO2 22  GLUCOSE 186*  BUN 34*  CREATININE 1.21*  CALCIUM  9.3    Intake/Output Summary (Last 24 hours) at 02/14/2024 1250 Last data filed at 02/14/2024 0800 Gross per 24 hour  Intake 720 ml  Output --  Net 720 ml        Physical Exam: Vital Signs Blood pressure 138/76, pulse 72, temperature 98.5 F (36.9 C), temperature source Oral, resp. rate 16, height 5' 3 (1.6 m), weight 87.6 kg, SpO2 97%.     General: awake, alert, appropriate, NAD, laying in bed appears comfortable HENT: conjugate gaze; oropharynx moist CV: regular rate and rhythm; no JVD Pulmonary: CTA B/L; non-labored breathing GI: soft, NT, ND, + Bowel sounds Psychiatric: appropriate, pleasant  Ext: no clubbing, cyanosis, tr edema right lower extremity, 2+ left lower extremity -chronic for many years Psych: pleasant and cooperative Skin: Clean and intact without signs of breakdown on visible portion   MSK: healed TKA incisions, no joint abnormality noted No significant tenderness to palpation over her shoulders, trapezius or left leg/foot.  Minimal pain with left shoulder passive ROM  Neuro: Alert and awake, follows commands, left upper and lower extremity weakness.  Weakness of both shoulders-reports chronic due to arthritis   Prior exam  Neuro: Alert and oriented x 4, follows simple commands, able to name and repeat, left homonymous  hemianopsia-unchanged, right gaze preference able to cross midline.  Still has a lot of difficulty with visual scanning to the left.  Left facial weakness.  Right upper and lower extremity strength appear to be intact,Other than right shoulder where she only ABduct said to about 40 or 50 degrees due to pain.  Left upper and lower extremity weakness 3-4 out of 5 other than left shoulder motor exam pain limited Prior neuro assessment is c/w today's exam 02/14/2024.   Assessment/Plan: 1. Functional deficits which require 3+ hours per day of interdisciplinary therapy in a comprehensive inpatient rehab setting. Physiatrist is providing close team supervision and 24 hour management of active medical problems listed below. Physiatrist and rehab team continue to assess barriers to discharge/monitor patient progress toward functional and medical goals  Care Tool:  Bathing    Body parts bathed by patient: Left arm, Chest, Front perineal area, Right upper leg, Left upper leg, Face, Right arm, Abdomen, Buttocks, Right lower leg, Left lower leg   Body parts bathed by helper: Buttocks     Bathing assist Assist Level: Minimal Assistance - Patient > 75%     Upper Body Dressing/Undressing Upper body dressing   What is the  patient wearing?: Pull over shirt    Upper body assist Assist Level: Minimal Assistance - Patient > 75%    Lower Body Dressing/Undressing Lower body dressing      What is the patient wearing?: Pants, Incontinence brief     Lower body assist Assist for lower body dressing: Moderate Assistance - Patient 50 - 74%     Toileting Toileting Toileting Activity did not occur Press photographer and hygiene only): Refused  Toileting assist Assist for toileting: Minimal Assistance - Patient > 75%     Transfers Chair/bed transfer  Transfers assist     Chair/bed transfer assist level: Contact Guard/Touching assist     Locomotion Ambulation   Ambulation assist       Assist level: Minimal Assistance - Patient > 75% Assistive device: Walker-rolling Max distance: 20'   Walk 10 feet activity   Assist     Assist level: Minimal Assistance - Patient > 75% Assistive device: Walker-rolling   Walk 50 feet activity   Assist Walk 50 feet with 2 turns activity did not occur: Safety/medical concerns  Assist level: 2 helpers (+2 for WC follow) Assistive device: Walker-rolling    Walk 150 feet activity   Assist Walk 150 feet activity did not occur: Safety/medical concerns         Walk 10 feet on uneven surface  activity   Assist Walk 10 feet on uneven surfaces activity did not occur: Safety/medical concerns         Wheelchair     Assist Is the patient using a wheelchair?: Yes Type of Wheelchair: Manual    Wheelchair assist level: Supervision/Verbal cueing Max wheelchair distance: 150'    Wheelchair 50 feet with 2 turns activity    Assist        Assist Level: Supervision/Verbal cueing   Wheelchair 150 feet activity     Assist      Assist Level: Supervision/Verbal cueing   Blood pressure 138/76, pulse 72, temperature 98.5 F (36.9 C), temperature source Oral, resp. rate 16, height 5' 3 (1.6 m), weight 87.6 kg, SpO2 97%.  Medical Problem List and Plan: 1. 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  Antithrombotics: -DVT/anticoagulation:  Pharmaceutical: Eliquis              -antiplatelet therapy: ECASA 3. Pain Management: tylenol  prn. Voltaren  gel qid to knee.   -10/1 arm chest wall muscle soreness likely due to UE use with therapy. Robaxin  and  lidocaine patch  -10/2 will order tramadol  as needed every 12 hours  -10/3 left shoulder x-ray without acute changes, narrowing of subacromial interval but can be seen with rotator cuff pathology and evidence of arthritis noted.  If continues to be significant limitation could consider additional imaging.  Today she reports pain is improving so we will monitor for now.  10/4- no complaints of pain this AM except gas pain  10/6 consider starting gabapentin  for neuropathic pain, shoulder pain appears to be improved  10/7-9 pain appears to be improved today, continue to monitor for now 4. Mood/Behavior/Sleep: LCSW to follow for evaluation and support.             --Melatonin prn.              -antipsychotic agents: N/A 5. Neuropsych/cognition: This patient is capable of making decisions on her own behalf. 6. Skin/Wound Care: Routine pressure relief measures.  7. Fluids/Electrolytes/Nutrition: Monitor I/O. Check CMET in am.  8. A fib: Monitor HR TID--on metoprolol  --Eliquis  resumed 09/18, heart rate stable 9. HTN: Monitor BP TID--on amlodipine , Lotensin  and Metoprolol .  Avoid hypotension and with long-term goal normotensive             -Fair control continue to monitor, internal medicine suggests outpatient on thiazide with benazepril  combo pill to reduce pill burden  -9/20 BP mildly elevated, continue current regimen for now would like to avoid causing hypotension  Increase toprol  XL to 62.5mg  daily  -10/6 BP a little soft at times, will decrease amlodipine  to 5 mg daily  - 10/8 BP intermittently soft, will dc amlodipine  and monitor BP off this medication   -10/9 restart amlodipine  at 2.5 mg daily    02/14/2024   10:06 AM 02/14/2024    8:08 AM 02/14/2024    6:00 AM  Vitals with BMI  Weight   193 lbs 2 oz  BMI   34.22  Systolic 138 149   Diastolic 76 76   Pulse 72 65       10. CKD: BUN/SCr 30/1.40 @ admission.  --Baseline likely 1.3-1.5 in the last few months.              --Recheck in  am.              - 9/19 reorder CMP, initial lab draw hemolyzed  -9/22 BUN/Cr   elevated beyond baseline today but within her variance going back to June labs.  Jardiance  resumed 9/18 which should have helped. She is on lotensin , however. Consider holding if further trend up   -for now encourage fluids   -recheck BMET in AM  -9/23 Cr down to 1.48, around her baseline, BUN a little higher at 35, continue to encourage oral fluids  -9/25 creatinine a little higher again today at at 1.61/BUN 33, encourage oral fluids, will hold Jardiance  until recheck tomorrow.  Not a lot of fluid intake recorded.  Discussed encouraging fluids with nursing  -9/26 creatinine/BUN much improved today to 1.36/29.  Continue oral fluid intake.  Start Jardiance   -9/29 creatinine up to 1.82, stop Jardiance . NS 50ml/hr for mild AKI  Addendum refusing IVF, encourage oral fluids  -9/30 creatinine much improved to 1.28/BUN 32 after encouraging oral fluids  -10/2 creatinine and BUN back up, will encourage fluids and asked nursing to do the same.  Recheck tomorrow  - 10/3 BUN and creatinine overall improved today, continue encourage fluid intake and recheck Monday  -10/6 BUN a little bit higher at 45, creatinine 1.44, continue encourage oral fluids  10/7 fair fluid intake 960 mL recorded, continue to encourage  -10/9 creatinine doing better today at 1.21/BUN down to 34 continue current regimen 11. T2DM: Hgb A1c- 6.5 and well controlled. Was on Semaglutide  1 mg, Amaryl  1 mg and Jardiance  25 mg PTA             --Jardiance  resumed on 09/18 --Monitor BS ac/hs and use SSI for elevated BS -9/19-22  fair control continue to monitor trend  9/23 controlled, discussed  with pharmacy will DC sliding scale and change CBGs to twice a day -9/24 controlled overall, decrease CBG to daily -9/25 controlled continue to monitor daily for now CBG stable today consider discontinue CBGs Discontinue CBGs -10/3 glucose remains stable on BMPs -10/9  glucose little higher on BMP, restart CBGs for now.  Nursing notified CBG (last 3)  No results for input(s): GLUCAP in the last 72 hours.     12. 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  13. Scoliosis/Spondylolisthesis: Has hx of chronic LBP w/BLE weakness --also endstage OA right knee--continue Diclofenac  gel qid  14. Hyperthyroid: continue Tapazole  5 mg daily.   15. Hyperlipidemia: LDL 123 w/goal < 70.  --continue Crestor  20 mg (was increased) and Zetia .   16. Constipation             Last BM 9/27, will decrease miralax  back to daily  9/29 continue current regimen and consider additional medication tomorrow if no BM  9/30 large bowel movement yesterday continue to monitor  10/1 LBM 9/29 consider additional medication tomorrow if no BM, sorbitol PRN  10/3 LBM 10/1 1 large BM and several smaller ones.  Continue current regimen for now  10/9 LBM today, large improved  17. Hyperactive gas  10/4- will order Gasx 160 mg QID       LOS: 21 days A FACE TO FACE EVALUATION WAS PERFORMED  Murray Collier 02/14/2024, 12:50 PM

## 2024-02-14 NOTE — Progress Notes (Signed)
 Patient ID: Megan Fischer, female   DOB: [birth year], 73 y.o.   MRN: [id number]  Have reviewed team conference with pt and family. Both aware and agreeable with targeted d/c date of 10/10 and goals of Minimal Assistance - Patient > 75%.   Non-emergency transportation set up with [facility] for 3:30pm. Left a voicemail with son.   HH OT/PT/SLP set up with [place]

## 2024-02-14 NOTE — Progress Notes (Signed)
 Occupational Therapy Session Note  Patient Details  Name: Megan Fischer MRN: [id number] Date of Birth: [birth year]  Today's Date: 02/14/2024 OT Individual Time: 8948-8862 OT Individual Time Calculation (min): 46 min    Short Term Goals: Week 3:  OT Short Term Goal 1 (Week 3): See LTG's secondary to LOS  Skilled Therapeutic Interventions/Progress Updates:  Pt greeted in seat of stedy with nursing present, pt agreeable to OT intervention.      Transfers/bed mobility/functional mobility:  Pt completed multiple stand pivot transfers with RW and MIN A, pt stands with heavy reliance on BUE and tends to stand with flexed posture needing MAX cues and increased time to stand with chest elevated. Buckling noted in LLE during transfer needing MODA to recover.  Pt completed sit>stands with MODA with heavy reliance on BUEs and increased time needed to transition BUEs from arm rests of chair >RW.   Therapeutic activity:  Pt completed dynamic balance task with pt instructed to stand from mat table with MODA with RW and reach out of BOS to remove squigz from mirror with a focus on visual scanning to L side and dynamic balance. Pt needed MIN A for dynamic balance, L lean noted.  Graded task up and instructed pt to place squigz in specific place with a focus on targeted reaching. Pt completed task with [facility] for standing balance.     Ended session with pt supine in bed with all needs within reach and bed alarm activated.                    Therapy Documentation Precautions:  Precautions Precautions: Fall Recall of Precautions/Restrictions: Impaired Precaution/Restrictions Comments: L homonymous hemianopsia, L hemi, B TKA's pending revision per family, R UE arthritis Restrictions Weight Bearing Restrictions Per Provider Order: No  Pain: Unrated pain reported in bil shoulder, rest breaks provided, RN to bring pain meds.    Therapy/Group: Individual Therapy  Ronal Mallie Needy 02/14/2024, 12:20  PM

## 2024-02-14 NOTE — Progress Notes (Signed)
 Occupational Therapy Session Note  Patient Details  Name: Megan Fischer MRN: [id number] Date of Birth: [birth year]  Today's Date: 02/14/2024 OT Individual Time: 0915-1000 OT Individual Time Calculation (min): 45 min    Short Term Goals: Week 1:  OT Short Term Goal 1 (Week 1): Patient to perform upper body dressing with Min assist OT Short Term Goal 1 - Progress (Week 1): Met OT Short Term Goal 2 (Week 1): Patient to perform toilet transfer with Mod assist of one OT Short Term Goal 2 - Progress (Week 1): Met OT Short Term Goal 3 (Week 1): Patient to perform shower seated with Mod assist and AE as needed OT Short Term Goal 3 - Progress (Week 1): Met Week 2:  OT Short Term Goal 1 (Week 2): Patient to perform upper body dressing with Min assist OT Short Term Goal 1 - Progress (Week 2): Met OT Short Term Goal 2 (Week 2): Patient to perform shower seated with Mod assist and AE as needed OT Short Term Goal 2 - Progress (Week 2): Met OT Short Term Goal 3 (Week 2): Patient to perform shower transfer with Mod assist of one. OT Short Term Goal 3 - Progress (Week 2): Met Week 3:  OT Short Term Goal 1 (Week 3): See LTG's secondary to LOS  Skilled Therapeutic Interventions/Progress Updates:    1:! Pt received in the bed. Grad day! Self care retraining to including dressing a EOB. Pt able to perform bed mobility to get to EOB with supervision with focus on visual attention to the left. Pt with difficulty orienting t- shirt to be able to don but with setup assistance and cues for attention left arm sleeve pt able to don with min A. Pt able to thread her right LE with success and min A for LE. Pt able to doff both socks and don clean onees with setup. Pt was able to perform sit to stand from EOB with RW for UE support with contact guard with extra time. Pt donned shoes with min A but were too tight for comfort and with continual swelling of left foot. Pt performed stand pivot transfer to the left to get into  the new loaner chair with min A with RW.   Pt continues to require cues for intellectual awareness. Pt reports she will be walking at home and not using the w/c and her plans are to go to [employer] to go shopping. We discussed community reintegration at w/c level. PT left sitting up in w/c at end of session with call bell and phone.   Therapy Documentation Precautions:  Precautions Precautions: Fall Recall of Precautions/Restrictions: Impaired Precaution/Restrictions Comments: L homonymous hemianopsia, L hemi, B TKA's pending revision per family, R UE arthritis Restrictions Weight Bearing Restrictions Per Provider Order: No  Pain: No reports of pain   Therapy/Group: Individual Therapy  [name] [facility] 02/14/2024, 11:52 AM

## 2024-02-14 NOTE — Plan of Care (Signed)
  Problem: RH Problem Solving Goal: LTG Patient will demonstrate problem solving for (SLP) Description: LTG:  Patient will demonstrate problem solving for basic/complex daily situations with cues  (SLP) Outcome: Completed/Met   Problem: RH Memory Goal: LTG Patient will demonstrate ability for day to day (SLP) Description: LTG:   Patient will demonstrate ability for day to day recall/carryover during cognitive/linguistic activities with assist  (SLP) Outcome: Completed/Met   Problem: RH Attention Goal: LTG Patient will demonstrate this level of attention during functional activites (SLP) Description: LTG:  Patient will will demonstrate this level of attention during functional activites (SLP) Outcome: Completed/Met

## 2024-02-14 NOTE — Discharge Summary (Signed)
 Physician Discharge Summary  Patient ID: Megan Fischer MRN: [id number] DOB/AGE: Oct 30, 1950 73 y.o.  Admit date: 01/24/2024 Discharge date: 02/15/2024  Discharge Diagnoses:  Principal Problem:   Acute ischemic right PCA stroke [facility]) Active Problems:   Atrial fibrillation (HCC)   DM (diabetes mellitus) type II controlled with renal manifestation (HCC)   Chronic kidney disease   Left shoulder pain   Discharged Condition: stable  Significant Diagnostic Studies: DG Shoulder Left Result Date: 02/07/2024 CLINICAL DATA:  [id number] Pain aggravated by exercise [id number] EXAM: LEFT SHOULDER - 2+ VIEW COMPARISON:  April 10, 2021 FINDINGS: Osteopenia. No acute fracture or dislocation. Narrowing of the subacromial interval. Mild osteophyte formation of the humeral head. Mild joint space narrowing of the acromioclavicular joint and glenohumeral joint. No area of erosion or osseous destruction. No unexpected radiopaque foreign body. Atherosclerotic calcifications. Cardiomegaly. IMPRESSION: 1. No acute fracture or dislocation. 2. Narrowing of the subacromial interval as can be seen in the setting of rotator cuff pathology. Electronically Signed   By: Corean Salter M.D.   On: 02/07/2024 16:45   CT HEAD WO CONTRAST ( ) Result Date: 01/25/2024 EXAM: CT HEAD WITHOUT CONTRAST 01/25/2024 11:38:00 PM TECHNIQUE: CT of the head was performed without the administration of intravenous contrast. Automated exposure control, iterative reconstruction, and/or weight based adjustment of the mA/kV was utilized to reduce the radiation dose to as low as reasonably achievable. COMPARISON: 01/21/2024 CLINICAL HISTORY: Fall, eval for head injury. 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 FINDINGS: BRAIN AND VENTRICLES: Hypoattenuation throughout the right PCA territory consistent with known recent infarction. No acute hemorrhage. No extra-axial collection. No mass effect or midline shift.  Incidental partially empty sella turcica. ORBITS: No acute abnormality. SINUSES: No acute abnormality. SOFT TISSUES AND SKULL: No acute soft tissue abnormality. No skull fracture. IMPRESSION: 1. Findings consistent with recent right PCA territory infarction; no acute hemorrhage or mass effect. 2. Incidental partially empty sella turcica. Electronically signed by: Franky Stanford MD 01/25/2024 11:50 PM EDT RP Workstation: [id number]    Labs:  Basic Metabolic Panel: Recent Labs  Lab 02/11/24 0610 02/14/24 0538  NA 139 138  K 4.2 4.1  CL 113* 107  CO2 18* 22  GLUCOSE 154* 186*  BUN 45* 34*  CREATININE 1.44* 1.21*  CALCIUM  9.0 9.3    CBC: Recent Labs  Lab 02/11/24 0610 02/14/24 0538  WBC 4.7 3.9*  HGB 11.3* 11.6*  HCT 35.1* 36.0  MCV 90.2 90.0  PLT 183 187    CBG: Recent Labs  Lab 02/14/24 1651 02/15/24 0608  GLUCAP 203* 156*    Brief HPI:   Megan Fischer is a 73 y.o. female with history of CHF, T2DM, giant cell bone tumor who was admitted in 09/50/25 with left-sided weakness, left facial weakness and left HH.  CT of head showed moderately large acute to subacute PCA infarct.  MRI brain showed large acute right-PCA infarct involving much of occipital lobe and portions of medial right temporal and right thalamus.  CTA head/neck showed occlusion of right-ICA with intracranial reconstitution and acute right PCA infarct with 33 mm penumbra.  Neurology recommended adding aspirin  to Eliquis  which was resumed on 09/18.   Therapy was working with patient who was limited by visual deficits as well as left-sided weakness with sensory deficits.  She was requiring plus to mod assist for transfers and mod assist +2 with ADLs.  She was independently 1 to 2 months prior to admission assessing recently.  CIR was recommended due  to functional decline.     Hospital Course: Megan Fischer was admitted to rehab 01/24/2024 for inpatient therapies to consist of PT, ST and OT at least three hours five  days a week. Past admission physiatrist, therapy team and rehab RN have worked together to provide customized collaborative inpatient rehab.  Eliquis  was resumed at admission and follow-up CBC showed mild drop in H/H and WBC on most recent check and recommend follow up CBC in 7-10 days to monitor for stability. Blood pressures were monitored on TID basis and were noted to be trending downwards therefore amlodipine  was decreased to 2.5 mg/day.  Check of BMET showed acute on chronic renal failure therefore Jardiance  was discontinued. She refused IVF for hydration and was encouraged to increase p.o. fluid intake. Follow up labs showed improvement and recommend follow up BMET in a couple of weeks to monitor renal status.   She reported left shoulder pain and has been educated on positioning. Shoulder x-rays done showing narrowing of subacromial joint seen with rotator cuff pathology as well as evidence of arthritis. She continues to have left foot pain and tingling and Tramadol  added for prn use.  Lidocaine patch also use for local measures. Her blood sugars were monitored on ac hs basis and were reasonably controlled on diet alone therefore CBG checks were discontinued. Fasting BS were monitored and glimepiride  was resumed at discharge. She continues to be limited by [facility] and knee as well as shoulder pain. She has progressed to min assist at wheelchair level and will continue to receive follow up HHPT, HHOT and HHST by [facility] after discharge.    Rehab course: During patient's stay in rehab weekly team conferences were held to monitor patient's progress, set goals and discuss barriers to discharge. At admission, patient required total assist with ADL task and mod assist with mobility. She exhibited cognitive deficits with SLUMS score 19/30 with deficits in attention, awareness and problem solving.  She  has had improvement in activity tolerance, balance, postural control as well as ability to  compensate for deficits. She has had improvement in functional use LUE  and LLE as well as improvement in awareness. She requires min assist with basic tranfers and for UB dressing. She requires max assist with LB dressing and mod assist with toileting. . Family has purchased STEDY to assist with transfers at home. She requires supervision to min assist for memory, attention and basic problem-solving.  She requires mod assist for left inattention and for awareness of deficits.  Family education has been completed.   Discharge disposition: 06-Home-Health Care Svc  Diet: Heart healthy/Carb Modified  Special Instructions: Repeat CBC in 7-10 days to monitor Hgb and WBC and BMET to monitor BUN/SCr.. Family to assist with medication and financial management.   Allergies as of 02/15/2024       Reactions   Aspirin  Nausea Only   Patient stated to neuro that it caused an upset stomach   Codeine Other (See Comments)   Unknown    Fentanyl  Other (See Comments)   hypoxia   Oxycodone Hcl Other (See Comments)   Unknown    Penicillins Other (See Comments)   Unknown    Chlorhexidine Gluconate Rash        Medication List     STOP taking these medications    DULoxetine  60 MG capsule Commonly known as: CYMBALTA    empagliflozin  25 MG Tabs tablet Commonly known as: Jardiance    ergocalciferol 1.25 MG (50000 UT) capsule Commonly known as:  VITAMIN D2   furosemide  40 MG tablet Commonly known as: Lasix        TAKE these medications    acetaminophen  325 MG tablet Commonly known as: TYLENOL  Take 1-2 tablets (325-650 mg total) by mouth every 4 (four) hours as needed for mild pain (pain score 1-3).   amLODipine  2.5 MG tablet Commonly known as: NORVASC  Take 1 tablet (2.5 mg total) by mouth daily. What changed:  medication strength how much to take   aspirin  EC 81 MG tablet Take 1 tablet (81 mg total) by mouth daily. Swallow whole.   benazepril  20 MG tablet Commonly known as:  LOTENSIN  Take 1 tablet (20 mg total) by mouth daily.   CALCIUM  PO Take 1 tablet by mouth 2 (two) times daily.   camphor-menthol  lotion Commonly known as: SARNA Apply topically as needed for itching.   diclofenac  Sodium 1 % Gel Commonly known as: VOLTAREN  Apply 2 g topically 4 (four) times daily. To both knees   Eliquis  5 MG Tabs tablet Generic drug: apixaban  TAKE 1 TABLET BY MOUTH TWICE  DAILY   ezetimibe  10 MG tablet Commonly known as: ZETIA  Take 1 tablet (10 mg total) by mouth daily.   famotidine  20 MG tablet Commonly known as: PEPCID  Take 1 tablet (20 mg total) by mouth daily.   ferrous sulfate  325 (65 FE) MG EC tablet Take 1 tablet (325 mg total) by mouth 2 (two) times daily. What changed: when to take this Notes to patient: Can resume once a day at home.    gabapentin  100 MG capsule Commonly known as: NEURONTIN  Take 1 capsule (100 mg total) by mouth 3 (three) times daily.   glimepiride  1 MG tablet Commonly known as: AMARYL  Take 1 tablet (1 mg total) by mouth daily with breakfast.   lidocaine 5 % Commonly known as: LIDODERM Place 2 patches onto the skin daily. Remove & Discard patch within 12 hours or as directed by MD   methimazole  5 MG tablet Commonly known as: TAPAZOLE  Take 1 tablet (5 mg total) by mouth daily.   methocarbamol  500 MG tablet Commonly known as: ROBAXIN  Take 1 tablet (500 mg total) by mouth every 6 (six) hours as needed for muscle spasms. What changed: See the new instructions.   metoprolol  succinate 50 MG 24 hr tablet Commonly known as: TOPROL -XL TAKE 1 TABLET BY MOUTH DAILY  WITH OR IMMEDIATELY FOLLOWING A  MEAL   OneTouch Verio test strip Generic drug: glucose blood USE AS DIRECTED TO CHECK  BLOOD SUGAR TWICE DAILY   Ozempic  (2 MG/DOSE) 8 MG/3ML Sopn Generic drug: Semaglutide  (2 MG/DOSE) INJECT SUBCUTANEOUSLY 2 MG EVERY WEEK Notes to patient: Resume at home--start tomorrow evening.   polyethylene glycol powder 17 GM/SCOOP  powder Commonly known as: GLYCOLAX /MIRALAX  Dissolve 1 capful (17g) in 4-8 ounces of liquid and take by mouth daily.   rosuvastatin  20 MG tablet Commonly known as: CRESTOR  Take 1 tablet (20 mg total) by mouth daily after supper. What changed: when to take this   simethicone 80 MG chewable tablet Commonly known as: MYLICON Chew 2 tablets (160 mg total) by mouth 4 (four) times daily. Notes to patient: For bloating and gas   Stool Softener/Laxative 50-8.6 MG tablet Generic drug: senna-docusate Take 2 tablets by mouth at bedtime.   traMADol  50 MG tablet--Rx# 20 pills Commonly known as: ULTRAM  Take 1 tablet (50 mg total) by mouth every 6 (six) hours as needed for severe pain (pain score 7-10). Notes to patient: Limit to 2 pills per  day AS NEEDED FOR SEVERE PAIN.         Follow-up Information     Bernadine Manos, MD Follow up.   Specialty: Internal Medicine Why: Call in 1-2 days for post hospital follow up Contact information: [address] [place] [address] [phone]         Urbano Albright, MD Follow up.   Specialty: Physical Medicine and Rehabilitation Why: office will call you with follow up appointment Contact information: [address] [place] [address] [phone]         [facility] Follow up.   Contact information: [address]     [address] [place]  [address] [phone]                Signed: Sharlet GORMAN Schmitz 02/18/2024, 3:31 PM

## 2024-02-14 NOTE — Plan of Care (Signed)
  Problem: RH Grooming Goal: LTG Patient will perform grooming w/assist,cues/equip (OT) Description: LTG: Patient will perform grooming with assist, with/without cues using equipment (OT) Outcome: Not Met (add Reason) Note: Requires setup    Problem: RH Bathing Goal: LTG Patient will bathe all body parts with assist levels (OT) Description: LTG: Patient will bathe all body parts with assist levels (OT) Outcome: Not Met (add Reason) Note: Requires min A   Problem: RH Dressing Goal: LTG Patient will perform upper body dressing (OT) Description: LTG Patient will perform upper body dressing with assist, with/without cues (OT). Outcome: Not Met (add Reason) Note: Requires minA  Goal: LTG Patient will perform lower body dressing w/assist (OT) Description: LTG: Patient will perform lower body dressing with assist, with/without cues in positioning using equipment (OT) Outcome: Not Met (add Reason) Flowsheets (Taken 02/14/2024 1200) LTG: Pt will perform lower body dressing with assistance level of: Maximal Assistance - Patient 25 - 49% Note: Reqiures max A    Problem: RH Toileting Goal: LTG Patient will perform toileting task (3/3 steps) with assistance level (OT) Description: LTG: Patient will perform toileting task (3/3 steps) with assistance level (OT)  Outcome: Not Met (add Reason) Note: Requires mod A    Problem: RH Vision Goal: RH LTG Vision (Specify) Outcome: Completed/Met   Problem: RH Functional Use of Upper Extremity Goal: LTG Patient will use RT/LT upper extremity as a (OT) Description: LTG: Patient will use right/left upper extremity as a stabilizer/gross assist/diminished/nondominant/dominant level with assist, with/without cues during functional activity (OT) Outcome: Completed/Met   Problem: RH Toilet Transfers Goal: LTG Patient will perform toilet transfers w/assist (OT) Description: LTG: Patient will perform toilet transfers with assist, with/without cues using  equipment (OT) Outcome: Completed/Met   Problem: RH Tub/Shower Transfers Goal: LTG Patient will perform tub/shower transfers w/assist (OT) Description: LTG: Patient will perform tub/shower transfers with assist, with/without cues using equipment (OT) Outcome: Not Applicable

## 2024-02-15 ENCOUNTER — Other Ambulatory Visit (HOSPITAL_COMMUNITY): Payer: Self-pay

## 2024-02-15 DIAGNOSIS — M25511 Pain in right shoulder: Secondary | ICD-10-CM

## 2024-02-15 DIAGNOSIS — M25512 Pain in left shoulder: Secondary | ICD-10-CM

## 2024-02-15 LAB — GLUCOSE, CAPILLARY
Glucose-Capillary: 156 mg/dL — ABNORMAL HIGH (ref 70–99)
Glucose-Capillary: 204 mg/dL — ABNORMAL HIGH (ref 70–99)

## 2024-02-15 NOTE — Progress Notes (Signed)
 Inpatient Rehabilitation Discharge Medication Review by a Pharmacist  A complete drug regimen review was completed for this patient to identify any potential clinically significant medication issues.  High Risk Drug Classes Is patient taking? Indication by Medication  Antipsychotic No   Anticoagulant Yes Eliquis  - Afib  Antibiotic No   Opioid Yes Tramadol  - severe pain  Antiplatelet Yes Aspirin  - antiplatelet  Hypoglycemics/insulin  Yes Ozempic  - DM  Vasoactive Medication Yes Amlodipine , Benazepril , Metoprolol  - HTN, Afib  Chemotherapy No   Other Yes Diclofenac  gel, Lidocaine patch - topical pain relief Ferrous sulfate , Calcium  - supplements Rosuvastatin , ezetimibe  - cholesterol Methimazole  - thyroid  PEG , Senokot-S , and - constipation Famotidine - reflux  Acetaminophen - pain  gabapentin - neuropathy  simethicone- gas   Methocarbamol  - muscle spasms Sarna lotion - itching     Type of Medication Issue Identified Description of Issue Recommendation(s)  Drug Interaction(s) (clinically significant)     Duplicate Therapy     Allergy     No Medication Administration End Date     Incorrect Dose     Additional Drug Therapy Needed     Significant med changes from prior encounter (inform family/care partners about these prior to discharge). STOP duloxetine , empagliflozin , ergocalciferol, furosemide , glimepiride  Communicate relevant medication changes to patient/family members at discharge from CIR.   Restart or discontinue PTA meds not resumed in CIR at discharge if clinically indicated.   Other       Clinically significant medication issues were identified that warrant physician communication and completion of prescribed/recommended actions by midnight of the next day:  No  Name of provider notified for urgent issues identified:   Provider Method of Notification:     Pharmacist comments:   Time spent performing this drug regimen review (minutes):  20   Rocky Slade, PharmD, BCPS

## 2024-02-15 NOTE — Progress Notes (Signed)
 Patient ID: Megan Fischer, female   DOB: Nov 02, 1950, 73 y.o.   MRN: [id number]  Transport packet complete and left at nurses station for pick-up time of 3:30pm with [facility].

## 2024-02-15 NOTE — Progress Notes (Signed)
 INPATIENT REHABILITATION DISCHARGE NOTE   Discharge instructions by: Holley, PA  Verbalized understanding: yes   Skin care/Wound care healing? none  Pain: patches applied  IV's: none  Tubes/Drains: none  O2: none  Safety instructions: reviewed with pt and family  Patient belongings: sent with pt, including discharge meds  Discharged to: home  Discharged via: [facility] transport  Notes: done

## 2024-02-15 NOTE — Progress Notes (Signed)
 PROGRESS NOTE   Subjective/Complaints: Patient excited to go home.  No additional concerns.  ROS:   Pt denies fever, chills SOB, abd pain, CP, N/V/C/D, and vision changes.  Denies any new motor or sensory changes  + Left foot tingling-continued but denies pain in this area Chronic shoulder pain bilaterally    Objective:   No results found.   Recent Labs    02/14/24 0538  WBC 3.9*  HGB 11.6*  HCT 36.0  PLT 187     Recent Labs    02/14/24 0538  NA 138  K 4.1  CL 107  CO2 22  GLUCOSE 186*  BUN 34*  CREATININE 1.21*  CALCIUM  9.3    Intake/Output Summary (Last 24 hours) at 02/15/2024 1223 Last data filed at 02/15/2024 0739 Gross per 24 hour  Intake 720 ml  Output --  Net 720 ml        Physical Exam: Vital Signs Blood pressure (!) 156/56, pulse 68, temperature 98.5 F (36.9 C), temperature source Oral, resp. rate 17, height 5' 3 (1.6 m), weight 87.6 kg, SpO2 100%.     General: awake, alert, appropriate, NAD, laying in bed appears comfortable HENT: conjugate gaze; oropharynx moist CV: regular rate and rhythm; no JVD Pulmonary: CTA B/L; non-labored breathing GI: soft, NT, ND, + Bowel sounds Psychiatric: appropriate, pleasant  Ext: no clubbing, cyanosis, tr edema right lower extremity, 2+ left lower extremity -chronic for many years Psych: pleasant and cooperative Skin: Clean and intact without signs of breakdown on visible portion   MSK: healed TKA incisions, no joint abnormality noted No significant tenderness to palpation over her shoulders, trapezius or left leg/foot.  Mild pain with left shoulder passive ROM  Neuro: Alert and awake, follows commands, left upper and lower extremity weakness 4/5 in LUE and 3-4/5 in LLE.  Weakness of both shoulders 3/5 -reports chronic due to arthritis   Prior exam  Neuro: Alert and oriented x 4, follows simple commands, able to name and repeat, left  homonymous hemianopsia-unchanged, right gaze preference able to cross midline.  Still has a lot of difficulty with visual scanning to the left.  Left facial weakness.  Right upper and lower extremity strength appear to be intact,Other than right shoulder where she only ABduct said to about 40 or 50 degrees due to pain.  Left upper and lower extremity weakness 3-4 out of 5 other than left shoulder motor exam pain limited Prior neuro assessment is c/w today's exam 02/15/2024.   Assessment/Plan: 1. Functional deficits which require 3+ hours per day of interdisciplinary therapy in a comprehensive inpatient rehab setting. Physiatrist is providing close team supervision and 24 hour management of active medical problems listed below. Physiatrist and rehab team continue to assess barriers to discharge/monitor patient progress toward functional and medical goals  Care Tool:  Bathing    Body parts bathed by patient: Left arm, Chest, Front perineal area, Right upper leg, Left upper leg, Face, Right arm, Abdomen, Buttocks, Right lower leg, Left lower leg   Body parts bathed by helper: Buttocks     Bathing assist Assist Level: Minimal Assistance - Patient > 75%     Upper Body Dressing/Undressing Upper  body dressing   What is the patient wearing?: Pull over shirt    Upper body assist Assist Level: Minimal Assistance - Patient > 75%    Lower Body Dressing/Undressing Lower body dressing      What is the patient wearing?: Pants, Incontinence brief     Lower body assist Assist for lower body dressing: Moderate Assistance - Patient 50 - 74%     Toileting Toileting Toileting Activity did not occur Press photographer and hygiene only): Refused  Toileting assist Assist for toileting: Minimal Assistance - Patient > 75%     Transfers Chair/bed transfer  Transfers assist     Chair/bed transfer assist level: Contact Guard/Touching assist     Locomotion Ambulation   Ambulation  assist      Assist level: Minimal Assistance - Patient > 75% Assistive device: Walker-rolling Max distance: 20   Walk 10 feet activity   Assist     Assist level: Minimal Assistance - Patient > 75% Assistive device: Walker-rolling   Walk 50 feet activity   Assist Walk 50 feet with 2 turns activity did not occur: Safety/medical concerns (endurance deficit)  Assist level: 2 helpers (+2 for WC follow) Assistive device: Walker-rolling    Walk 150 feet activity   Assist Walk 150 feet activity did not occur: Safety/medical concerns (endurance deficit)         Walk 10 feet on uneven surface  activity   Assist Walk 10 feet on uneven surfaces activity did not occur: Safety/medical concerns (endurance deficit)         Wheelchair     Assist Is the patient using a wheelchair?: Yes Type of Wheelchair: Manual    Wheelchair assist level: Supervision/Verbal cueing Max wheelchair distance: 150'    Wheelchair 50 feet with 2 turns activity    Assist        Assist Level: Supervision/Verbal cueing   Wheelchair 150 feet activity     Assist      Assist Level: Supervision/Verbal cueing   Blood pressure (!) 156/56, pulse 68, temperature 98.5 F (36.9 C), temperature source Oral, resp. rate 17, height 5' 3 (1.6 m), weight 87.6 kg, SpO2 100%.  Medical Problem List and Plan: 1. 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  Antithrombotics: -DVT/anticoagulation:  Pharmaceutical: Eliquis              -antiplatelet therapy: ECASA 3. Pain Management: tylenol  prn. Voltaren  gel qid to knee.   -10/1 arm chest  wall muscle soreness likely due to UE use with therapy. Robaxin  and lidocaine patch  -10/2 will order tramadol  as needed every 12 hours  -10/3 left shoulder x-ray without acute changes, narrowing of subacromial interval but can be seen with rotator cuff pathology and evidence of arthritis noted.  If continues to be significant limitation could consider additional imaging.  Today she reports pain is improving so we will monitor for now.  10/4- no complaints of pain this AM except gas pain  10/6 consider starting gabapentin  for neuropathic pain, shoulder pain appears to be improved  10/10 patient reports tramadol  helping more than hydrocodone  (she had this outpatient).  Okay for short course of tramadol  as needed on discharge for pain 4. Mood/Behavior/Sleep: LCSW to follow for evaluation and support.             --Melatonin prn.              -antipsychotic agents: N/A 5. Neuropsych/cognition: This patient is capable of making decisions on her own behalf. 6. Skin/Wound Care: Routine pressure relief measures.  7. Fluids/Electrolytes/Nutrition: Monitor I/O. Check CMET in am.  8. A fib: Monitor HR TID--on metoprolol  --Eliquis  resumed 09/18, heart rate stable 9. HTN: Monitor BP TID--on amlodipine , Lotensin  and Metoprolol .  Avoid hypotension and with long-term goal normotensive             -Fair control continue to monitor, internal medicine suggests outpatient on thiazide with benazepril  combo pill to reduce pill burden  -9/20 BP mildly elevated, continue current regimen for now would like to avoid causing hypotension  Increase toprol  XL to 62.5mg  daily  -10/6 BP a little soft at times, will decrease amlodipine  to 5 mg daily  - 10/8 BP intermittently soft, will dc amlodipine  and monitor BP off this medication   -10/9 restart amlodipine  at 2.5 mg daily  -10/10 fair control continue to monitor, okay to adjust Toprol -XL to 50 mg (simplify dosing)    02/15/2024    3:31 AM 02/14/2024    7:28 PM 02/14/2024     3:24 PM  Vitals with BMI  Systolic 156 146 856  Diastolic 56 62 85  Pulse 68 60 67      10. CKD: BUN/SCr 30/1.40 @ admission.  --Baseline likely 1.3-1.5 in the last few months.              --Recheck in am.              - 9/19 reorder CMP, initial lab draw hemolyzed  -9/22 BUN/Cr   elevated beyond baseline today but within her variance going back to June labs.  Jardiance  resumed 9/18 which should have helped. She is on lotensin , however. Consider holding if further trend up   -for now encourage fluids   -recheck BMET in AM  -9/23 Cr down to 1.48, around her baseline, BUN a little higher at 35, continue to encourage oral fluids  -9/25 creatinine a little higher again today at at 1.61/BUN 33, encourage oral fluids, will hold Jardiance  until recheck tomorrow.  Not a lot of fluid intake recorded.  Discussed encouraging fluids with nursing  -9/26 creatinine/BUN much improved today to 1.36/29.  Continue oral fluid intake.  Start Jardiance   -9/29 creatinine up to 1.82, stop Jardiance . NS 50ml/hr for mild AKI  Addendum refusing IVF, encourage oral fluids  -9/30 creatinine much improved to 1.28/BUN 32 after encouraging oral fluids  -10/2 creatinine and BUN back up, will encourage fluids and asked nursing to do the same.  Recheck tomorrow  - 10/3 BUN and creatinine overall improved today, continue encourage fluid intake and recheck Monday  -10/6 BUN a little bit higher at 45, creatinine 1.44, continue encourage oral fluids  10/7 fair fluid intake 960 mL recorded, continue to encourage  -10/9 creatinine doing better today at 1.21/BUN down to 34 continue current regimen 11. T2DM: Hgb A1c- 6.5 and well controlled. Was on Semaglutide  1 mg, Amaryl  1 mg and Jardiance  25 mg PTA             --  Jardiance  resumed on 09/18 --Monitor BS ac/hs and use SSI for elevated BS -9/19-22  fair control continue to monitor trend  9/23 controlled, discussed with pharmacy will DC sliding scale and change CBGs to twice  a day -9/24 controlled overall, decrease CBG to daily -9/25 controlled continue to monitor daily for now CBG stable today consider discontinue CBGs Discontinue CBGs -10/3 glucose remains stable on BMPs -10/9 glucose little higher on BMP, restart CBGs for now.  Nursing notified -10/10 CBGs a little higher 156-204 okay to resume Amaryl  on discharge for better control CBG (last 3)  Recent Labs    02/14/24 1651 02/15/24 0608 02/15/24 1222  GLUCAP 203* 156* 204*       12. 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  13. Scoliosis/Spondylolisthesis: Has hx of chronic LBP w/BLE weakness --also endstage OA right knee--continue Diclofenac  gel qid  14. Hyperthyroid: continue Tapazole  5 mg daily.   15. Hyperlipidemia: LDL 123 w/goal < 70.  --continue Crestor  20 mg (was increased) and Zetia .   16. Constipation             Last BM 9/27, will decrease miralax  back to daily  9/29 continue current regimen and consider additional medication tomorrow if no BM  9/30 large bowel movement yesterday continue to monitor  10/1 LBM 9/29 consider additional medication tomorrow if no BM, sorbitol PRN  10/3 LBM 10/1 1 large BM and several smaller ones.  Continue current regimen for now  10/10 LBM today  17. Hyperactive gas  10/4- will order Gasx 160 mg QID       LOS: 22 days A FACE TO FACE EVALUATION WAS PERFORMED  Murray Collier 02/15/2024, 12:23 PM

## 2024-02-18 ENCOUNTER — Emergency Department (HOSPITAL_COMMUNITY)
Admission: EM | Admit: 2024-02-18 | Discharge: 2024-02-18 | Disposition: A | Discharge status: Home / Self Care | Attending: Emergency Medicine | Admitting: Emergency Medicine

## 2024-02-18 ENCOUNTER — Emergency Department (HOSPITAL_COMMUNITY)

## 2024-02-18 ENCOUNTER — Other Ambulatory Visit: Payer: Self-pay

## 2024-02-18 ENCOUNTER — Encounter (HOSPITAL_COMMUNITY): Payer: Self-pay

## 2024-02-18 DIAGNOSIS — Z8673 Personal history of transient ischemic attack (TIA), and cerebral infarction without residual deficits: Secondary | ICD-10-CM | POA: Insufficient documentation

## 2024-02-18 DIAGNOSIS — R600 Localized edema: Secondary | ICD-10-CM | POA: Diagnosis not present

## 2024-02-18 DIAGNOSIS — R519 Headache, unspecified: Secondary | ICD-10-CM | POA: Diagnosis present

## 2024-02-18 DIAGNOSIS — M25512 Pain in left shoulder: Secondary | ICD-10-CM | POA: Insufficient documentation

## 2024-02-18 DIAGNOSIS — R0602 Shortness of breath: Secondary | ICD-10-CM | POA: Insufficient documentation

## 2024-02-18 DIAGNOSIS — Z7982 Long term (current) use of aspirin: Secondary | ICD-10-CM | POA: Insufficient documentation

## 2024-02-18 DIAGNOSIS — Z7901 Long term (current) use of anticoagulants: Secondary | ICD-10-CM | POA: Diagnosis not present

## 2024-02-18 DIAGNOSIS — M792 Neuralgia and neuritis, unspecified: Secondary | ICD-10-CM | POA: Insufficient documentation

## 2024-02-18 LAB — BASIC METABOLIC PANEL WITH GFR
Anion gap: 7 (ref 5–15)
BUN: 31 mg/dL — ABNORMAL HIGH (ref 8–23)
CO2: 26 mmol/L (ref 22–32)
Calcium: 8.9 mg/dL (ref 8.9–10.3)
Chloride: 105 mmol/L (ref 98–111)
Creatinine, Ser: 1.29 mg/dL — ABNORMAL HIGH (ref 0.44–1.00)
GFR, Estimated: 44 mL/min — ABNORMAL LOW (ref >60)
Glucose, Bld: 219 mg/dL — ABNORMAL HIGH (ref 70–99)
Potassium: 4.1 mmol/L (ref 3.5–5.1)
Sodium: 138 mmol/L (ref 135–145)

## 2024-02-18 LAB — CBC WITH DIFFERENTIAL/PLATELET
Abs Immature Granulocytes: 0.02 K/uL (ref 0.00–0.07)
Basophils Absolute: 0 K/uL (ref 0.0–0.1)
Basophils Relative: 0 %
Eosinophils Absolute: 0 K/uL (ref 0.0–0.5)
Eosinophils Relative: 0 %
HCT: 36 % (ref 36.0–46.0)
Hemoglobin: 11.5 g/dL — ABNORMAL LOW (ref 12.0–15.0)
Immature Granulocytes: 0 %
Lymphocytes Relative: 27 %
Lymphs Abs: 1.5 K/uL (ref 0.7–4.0)
MCH: 28.5 pg (ref 26.0–34.0)
MCHC: 31.9 g/dL (ref 30.0–36.0)
MCV: 89.3 fL (ref 80.0–100.0)
Monocytes Absolute: 0.5 K/uL (ref 0.1–1.0)
Monocytes Relative: 9 %
Neutro Abs: 3.5 K/uL (ref 1.7–7.7)
Neutrophils Relative %: 64 %
Platelets: 195 K/uL (ref 150–400)
RBC: 4.03 MIL/uL (ref 3.87–5.11)
RDW: 14.3 % (ref 11.5–15.5)
WBC: 5.6 K/uL (ref 4.0–10.5)
nRBC: 0 % (ref 0.0–0.2)

## 2024-02-18 LAB — TROPONIN I (HIGH SENSITIVITY)
Troponin I (High Sensitivity): 18 ng/L — ABNORMAL HIGH (ref <18)
Troponin I (High Sensitivity): 20 ng/L — ABNORMAL HIGH (ref <18)
Troponin I (High Sensitivity): 20 ng/L — ABNORMAL HIGH (ref <18)

## 2024-02-18 LAB — BRAIN NATRIURETIC PEPTIDE: B Natriuretic Peptide: 224.6 pg/mL — ABNORMAL HIGH (ref 0.0–100.0)

## 2024-02-18 MED ORDER — IOHEXOL 350 MG/ML SOLN
75.0000 mL | Freq: Once | INTRAVENOUS | Status: AC | PRN
  Administered 2024-02-18: 75 mL via INTRAVENOUS

## 2024-02-18 MED ORDER — PROCHLORPERAZINE EDISYLATE 10 MG/2ML IJ SOLN
10.0000 mg | Freq: Once | INTRAMUSCULAR | Status: AC
Start: 2024-02-18 — End: 2024-02-18
  Administered 2024-02-18: 10 mg via INTRAVENOUS
  Filled 2024-02-18: qty 2

## 2024-02-18 MED ORDER — ACETAMINOPHEN 325 MG PO TABS
650.0000 mg | ORAL_TABLET | Freq: Once | ORAL | Status: AC
  Administered 2024-02-18: 650 mg via ORAL
  Filled 2024-02-18: qty 2

## 2024-02-18 NOTE — ED Triage Notes (Signed)
 PT brought in by [facility], PT states she was SOB per ems upon arrival PT states she is having a headache and left shoulder pain. PT was DC home after CVA on 10/10. PT 98% on RA. PT alert and talking and VSS

## 2024-02-18 NOTE — Progress Notes (Signed)
 Inpatient Rehabilitation Care Coordinator Discharge Note   Patient Details  Name: Megan Fischer MRN: [id number] Date of Birth: [birth year]   Discharge location: Home with son, Mohammed providing 24/7 care  Length of Stay: 21 days  Discharge activity level: Minimal Assistance - Patient > 75%  Home/community participation: Active within the communtiy with support  Patient response un:Yzjouy Literacy - How often do you need to have someone help you when you read instructions, pamphlets, or other written material from your doctor or pharmacy?: Never  Patient response un:Dnrpjo Isolation - How often do you feel lonely or isolated from those around you?: Patient unable to respond (Discharged)  Services provided included: MD, RD, PT, OT, SLP, RN, CM, TR, Pharmacy, Neuropsych, SW  Financial Services:  Field seismologist Utilized: Private Insurance [employer] / DREMA DUAL COMPLETE  Choices offered to/list presented to: Gilmer Mohammed  Follow-up services arranged:  Home Health Home Health Agency: [phone]         Patient response to transportation need: Is the patient able to respond to transportation needs?: Yes In the past 12 months, has lack of transportation kept you from medical appointments or from getting medications?: No In the past 12 months, has lack of transportation kept you from meetings, work, or from getting things needed for daily living?: No   Patient/Family verbalized understanding of follow-up arrangements:  Yes  Individual responsible for coordination of the follow-up plan: Gilmer Mohammed  Confirmed correct DME delivered: [employer]  Loreli 02/18/2024    Comments (or additional information): Patient discharged home with son, Mohammed providing 24/7 care. CAP-DA information provided. Non-emergency transportation set up to return home. DME ordered.   Summary of Stay    Date/Time Discharge Planning CSW  02/11/24 1117 Plans to discharge home with  daughter, Mylinda providing assistance as needed (limited due to fall) - Patients son, Mohammed lives with her and able to privide care when there. Resources for HHA provided. Demographics faxed for wheelchair consultation. Son agreeable to family education on 10/8 between 1pm-4pm. DS  02/06/24 1636 Plans to discharge home with daughter, Mylinda providing assistance as needed (limited due to fall) - Patients son, Mohammed lives with her and able to privide care when there. Family interested in [facility]. Demographics faxed for wheelchair consultation. Son agreeable to family education on 10/8 between 1pm-4pm. DS  02/06/24 0847 Plans to discharge home with daughter, Mylinda providing assistance as needed (limited due to fall) - Patients son, Mohammed lives with her and able to privide care when there. Family interested in [facility]. Demographics faxed for wheelchair consultation. Awaiting therapy follow-up recommendations. DS  01/29/24 1235 Plans to discharge home with daughter, Mylinda providing assistance as needed (limited due to fall) - Patients son, Mohammed lives with her and able to privide care when there. Family interested in [facility]. Awaiting therapy follow-up recommendations. DS  01/29/24 1234 Plans to discharge home with daughter, Mylinda providing 24/7 assistance as needed (limited due to fall). Interested in [facility]. Awaiting therapy follow-up recommendations. DS       [employer]  Loreli

## 2024-02-18 NOTE — ED Provider Notes (Signed)
 [facility] AT [facility] Provider Note   CSN: [id number] Arrival date & time: 02/18/24  0032     Patient presents with: Headache   DONESHA WALLANDER is a 73 y.o. female.   Patient presents to the emergency department for evaluation of multiple problems.  Patient reports recent hospitalization for stroke.  She went home from the hospital 3 days ago.  Patient reports that she has intermittent cramping pain in the left shoulder.  This was present while she was in the hospital, has not improved.  Patient developed a headache around 2 PM today, global aching pain.  No vision changes.  Patient reports shortness of breath today, no chest pain.       Prior to Admission medications   Medication Sig Start Date End Date Taking? Authorizing Provider  acetaminophen  (TYLENOL ) 325 MG tablet Take 1-2 tablets (325-650 mg total) by mouth every 4 (four) hours as needed for mild pain (pain score 1-3). 01/31/24   Love, Sharlet RAMAN, PA-C  amLODipine  (NORVASC ) 2.5 MG tablet Take 1 tablet (2.5 mg total) by mouth daily. 02/15/24   Love, Sharlet RAMAN, PA-C  aspirin  EC 81 MG tablet Take 1 tablet (81 mg total) by mouth daily. Swallow whole. 02/14/24   Love, Sharlet RAMAN, PA-C  benazepril  (LOTENSIN ) 20 MG tablet Take 1 tablet (20 mg total) by mouth daily. 04/27/23 04/26/24  Jolaine Pac, DO  CALCIUM  PO Take 1 tablet by mouth 2 (two) times daily.    [provider]  camphor-menthol  VIKKI) lotion Apply topically as needed for itching. 01/31/24   Love, Sharlet RAMAN, PA-C  diclofenac  Sodium (VOLTAREN ) 1 % GEL Apply 2 g topically 4 (four) times daily. To both knees 02/14/24   Love, Sharlet RAMAN, PA-C  ELIQUIS  5 MG TABS tablet TAKE 1 TABLET BY MOUTH TWICE  DAILY 01/28/24   Ladona Heinz, MD  ezetimibe  (ZETIA ) 10 MG tablet Take 1 tablet (10 mg total) by mouth daily. 12/18/23 03/17/24  Ladona Heinz, MD  famotidine  (PEPCID ) 20 MG tablet Take 1 tablet (20 mg total) by mouth daily. 02/14/24   Love, Sharlet RAMAN, PA-C   ferrous sulfate  325 (65 FE) MG EC tablet Take 1 tablet (325 mg total) by mouth 2 (two) times daily. Patient taking differently: Take 325 mg by mouth daily. 09/27/14   Lanny Callander, MD  gabapentin  (NEURONTIN ) 100 MG capsule Take 1 capsule (100 mg total) by mouth 3 (three) times daily. 02/14/24   Love, Sharlet RAMAN, PA-C  glimepiride  (AMARYL ) 1 MG tablet Take 1 tablet (1 mg total) by mouth daily with breakfast. 10/18/23   Thapa, [place], MD  lidocaine (LIDODERM) 5 % Place 2 patches onto the skin daily. Remove & Discard patch within 12 hours or as directed by MD 02/14/24   Love, Sharlet RAMAN, PA-C  methimazole  (TAPAZOLE ) 5 MG tablet Take 1 tablet (5 mg total) by mouth daily. 06/22/23 06/21/24  Thapa, [place], MD  methocarbamol  (ROBAXIN ) 500 MG tablet Take 1 tablet (500 mg total) by mouth every 6 (six) hours as needed for muscle spasms. 02/14/24   Love, Sharlet RAMAN, PA-C  metoprolol  succinate (TOPROL -XL) 50 MG 24 hr tablet TAKE 1 TABLET BY MOUTH DAILY  WITH OR IMMEDIATELY FOLLOWING A  MEAL 09/20/23   Trudy Mliss Dragon, MD  [facility] VERIO test strip USE AS DIRECTED TO CHECK  BLOOD SUGAR TWICE DAILY 06/02/22   [place], MD  polyethylene glycol powder (GLYCOLAX /MIRALAX ) 17 GM/SCOOP powder Dissolve 1 capful (17g) in 4-8 ounces of liquid  and take by mouth daily. 02/15/24   Love, Sharlet RAMAN, PA-C  rosuvastatin  (CRESTOR ) 20 MG tablet Take 1 tablet (20 mg total) by mouth daily after supper. 02/14/24   Love, Sharlet RAMAN, PA-C  Semaglutide , 2 MG/DOSE, (OZEMPIC , 2 MG/DOSE,) 8 MG/3ML SOPN INJECT SUBCUTANEOUSLY 2 MG EVERY WEEK 06/22/23   Thapa, [place], MD  senna-docusate (SENOKOT-S) 8.6-50 MG tablet Take 2 tablets by mouth at bedtime. 02/14/24   Love, Sharlet RAMAN, PA-C  simethicone (MYLICON) 80 MG chewable tablet Chew 2 tablets (160 mg total) by mouth 4 (four) times daily. 02/14/24   Love, Sharlet RAMAN, PA-C  traMADol  (ULTRAM ) 50 MG tablet Take 1 tablet (50 mg total) by mouth every 6 (six) hours as needed for severe pain (pain score 7-10). 02/14/24    Love, Sharlet RAMAN, PA-C    Allergies: Aspirin , Codeine, Fentanyl , Oxycodone hcl, Penicillins, and Chlorhexidine gluconate    Review of Systems  Updated Vital Signs BP 128/70   Pulse 91   Temp 98.2 F (36.8 C) (Axillary)   Resp 15   Ht 5' 3 (1.6 m)   Wt 83.9 kg   SpO2 96%   BMI 32.77 kg/m   Physical Exam Vitals and nursing note reviewed.  Constitutional:      General: She is not in acute distress.    Appearance: She is well-developed.  HENT:     Head: Normocephalic and atraumatic.     Mouth/Throat:     Mouth: Mucous membranes are moist.  Eyes:     General: Vision grossly intact. Gaze aligned appropriately.     Extraocular Movements: Extraocular movements intact.     Conjunctiva/sclera: Conjunctivae normal.  Cardiovascular:     Rate and Rhythm: Normal rate and regular rhythm.     Pulses: Normal pulses.     Heart sounds: Normal heart sounds, S1 normal and S2 normal. No murmur heard.    No friction rub. No gallop.  Pulmonary:     Effort: Pulmonary effort is normal. No respiratory distress.     Breath sounds: Normal breath sounds.  Abdominal:     General: Bowel sounds are normal.     Palpations: Abdomen is soft.     Tenderness: There is no abdominal tenderness. There is no guarding or rebound.     Hernia: No hernia is present.  Musculoskeletal:        General: No swelling.     Cervical back: Full passive range of motion without pain, normal range of motion and neck supple. No spinous process tenderness or muscular tenderness. Normal range of motion.     Right lower leg: Edema present.     Left lower leg: Edema present.  Skin:    General: Skin is warm and dry.     Capillary Refill: Capillary refill takes less than 2 seconds.     Findings: No ecchymosis, erythema, rash or wound.  Neurological:     General: No focal deficit present.     Mental Status: She is alert and oriented to person, place, and time.     GCS: GCS eye subscore is 4. GCS verbal subscore is 5. GCS  motor subscore is 6.     Sensory: Sensation is intact.     Motor: Weakness (left) present.     Coordination: Coordination is intact.  Psychiatric:        Attention and Perception: Attention normal.        Mood and Affect: Mood normal.        Speech: Speech normal.  Behavior: Behavior normal.     (all labs ordered are listed, but only abnormal results are displayed) Labs Reviewed  CBC WITH DIFFERENTIAL/PLATELET - Abnormal; Notable for the following components:      Result Value   Hemoglobin 11.5 (*)    All other components within normal limits  BASIC METABOLIC PANEL WITH GFR - Abnormal; Notable for the following components:   Glucose, Bld 219 (*)    BUN 31 (*)    Creatinine, Ser 1.29 (*)    GFR, Estimated 44 (*)    All other components within normal limits  BRAIN NATRIURETIC PEPTIDE - Abnormal; Notable for the following components:   B Natriuretic Peptide 224.6 (*)    All other components within normal limits  TROPONIN I (HIGH SENSITIVITY) - Abnormal; Notable for the following components:   Troponin I (High Sensitivity) 18 (*)    All other components within normal limits  TROPONIN I (HIGH SENSITIVITY) - Abnormal; Notable for the following components:   Troponin I (High Sensitivity) 20 (*)    All other components within normal limits  TROPONIN I (HIGH SENSITIVITY) - Abnormal; Notable for the following components:   Troponin I (High Sensitivity) 20 (*)    All other components within normal limits    EKG: EKG Interpretation Date/Time:  Monday February 18 2024 00:39:35 EDT Ventricular Rate:  96 PR Interval:    QRS Duration:  86 QT Interval:  373 QTC Calculation: 472 R Axis:   -10  Text Interpretation: Atrial fibrillation Anteroseptal infarct, old Confirmed by Haze Lonni PARAS [phone]) on 02/18/2024 1:10:31 AM  Radiology: CT HEAD WO CONTRAST ( ) Result Date: 02/18/2024 CLINICAL DATA:  New onset headaches EXAM: CT HEAD WITHOUT CONTRAST TECHNIQUE: Contiguous  axial images were obtained from the base of the skull through the vertex without intravenous contrast. RADIATION DOSE REDUCTION: This exam was performed according to the departmental dose-optimization program which includes automated exposure control, adjustment of the mA and/or kV according to patient size and/or use of iterative reconstruction technique. 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 Adjacent linear hyperdensity is noted likely related to necrosis. No findings to suggest acute hemorrhage, acute infarction or space-occupying mass lesion are seen. Vascular: No hyperdense vessel or unexpected calcification. Skull: Normal. Negative for fracture or focal lesion. Sinuses/Orbits: No acute finding. Other: None. 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 No acute abnormality is noted. Electronically Signed   By: Oneil Devonshire M.D.   On: 02/18/2024 01:20   DG Chest Port 1 View Result Date: 02/18/2024 CLINICAL DATA:  Shortness of breath EXAM: PORTABLE CHEST 1 VIEW COMPARISON:  04/10/2021 FINDINGS: The heart size and mediastinal contours are within normal limits. Both lungs are clear. The visualized skeletal structures are unremarkable. IMPRESSION: No active disease. Electronically Signed   By: Oneil Devonshire M.D.   On: 02/18/2024 01:01     Procedures   Medications Ordered in the ED  acetaminophen  (TYLENOL ) tablet 650 mg (650 mg Oral Given 02/18/24 0120)  prochlorperazine  (COMPAZINE ) injection 10 mg (10 mg Intravenous Given 02/18/24 0120)                                    Medical Decision Making Amount and/or Complexity of Data Reviewed External Data Reviewed: labs, radiology, ECG and notes. Labs: ordered. Decision-making details documented in ED Course. Radiology: ordered and independent interpretation performed. Decision-making details documented in  ED Course. ECG/medicine tests: ordered and independent  interpretation performed. Decision-making details documented in ED Course.  Risk OTC drugs. Prescription drug management.   Presents with multiple problems.  Patient's main complaint today is headache.  She developed a headache during the afternoon and it has been persistent.  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  She was hospitalized for 3 days and then did inpatient rehab, discharged 3 days ago.  Reviewing her records from the inpatient rehab, patient has had persistent problems with the left shoulder which is felt to be neuropathic in nature.  She does have occasional spasms in the area of the shoulder.  She is not having chest pain.  She did indicate some shortness of breath.  Patient does not have EKG changes.  Troponins are slightly elevated and flat, 18, 20, 20.  In absence of chest pain I do not feel that this is consistent with acute coronary syndrome.  No hypoxia, tachycardia or blood pressure abnormality.  She is anticoagulated on Eliquis , doubt PE, although she was off of her anticoagulation when she was in the hospital for stroke.  She has since resumed it.  Will perform a PE study, if negative will be appropriate for discharge.  Discussed briefly with on-call neurology.  Patient did improve with Tylenol  and Compazine .  No new neurologic deficits, has some persistent left-sided deficits consistent with known recent stroke.  No further workup necessary at this time, can follow-up with neurology as an outpatient.     Final diagnoses:  Bad headache  Neuralgia of left upper extremity    ED Discharge Orders     None          Maekayla Giorgio, Lonni PARAS, MD 02/18/24 [phone]

## 2024-02-19 ENCOUNTER — Encounter: Payer: Self-pay | Admitting: Hematology

## 2024-02-19 ENCOUNTER — Telehealth: Payer: Self-pay

## 2024-02-19 ENCOUNTER — Other Ambulatory Visit: Payer: Self-pay | Admitting: Endocrinology

## 2024-02-19 ENCOUNTER — Other Ambulatory Visit: Payer: Self-pay | Admitting: Student

## 2024-02-19 ENCOUNTER — Telehealth: Payer: Self-pay | Admitting: *Deleted

## 2024-02-19 DIAGNOSIS — I10 Essential (primary) hypertension: Secondary | ICD-10-CM

## 2024-02-19 DIAGNOSIS — E0590 Thyrotoxicosis, unspecified without thyrotoxic crisis or storm: Secondary | ICD-10-CM

## 2024-02-19 NOTE — Telephone Encounter (Signed)
 [facility] called to request permission to move [facility] visit to next week due to conflicts with other MD visits this week for patient. Approval given.

## 2024-02-19 NOTE — Telephone Encounter (Signed)
   Pre-operative Risk Assessment    Patient Name: TAYSHA MAJEWSKI  DOB: [birth year] MRN: [id number]   Date of last office visit: 12/18/23 Date of next office visit:    Request for Surgical Clearance    Procedure:  revision of right total knee arthroplasty   Date of Surgery:  Clearance 03/11/24                                 Surgeon:  Dr. Cheryll, Fausto Nasuti, MD  Surgeon's Group or Practice Name:  [facility] [place]  Phone number:  [phone] Fax number:  [phone]   Type of Clearance Requested:   - Medical  - Pharmacy:  Hold Aspirin  and Apixaban  (Eliquis ) not indicated    Type of Anesthesia:  Not Indicated   Additional requests/questions:  Clearance stated Ms. Lanuza reported a recent hospitalization 01/24/24- 02/15/24 due to acute ischemic right pcs stroke (per the American heart association at least 6 to 9 months) we will postop current surgery date of March 11, 2024 in our medical opinion Ms. Swor is considered a increased risk at this time. To minimize these risk please let our office know when Ms. Eisenhart's cardiac health has been optimized and from a cardiac standpoint is cleared.   Bonney Rebeca Blight   02/19/2024, 3:47 PM

## 2024-02-21 NOTE — Progress Notes (Signed)
 Patient ID: Megan Fischer, female   DOB: May 17, 1950, 73 y.o.   MRN: [id number]  Faxed over CAP/DA information to [id number]

## 2024-02-22 NOTE — Telephone Encounter (Signed)
 Patient with diagnosis of atrial fibrillation on Eliquis  for anticoagulation.     Procedure:  revision of right total knee arthroplasty    Date of Surgery:  Clearance 03/11/24         CHA2DS2-VASc Score = 7   This indicates a 11.2% annual risk of stroke. 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    **This guidance is not considered finalized until pre-operative APP has relayed final recommendations.**

## 2024-02-22 NOTE — Telephone Encounter (Signed)
 Dr. Margaretann,  You saw this patient on 12/18/2023. Per protocol we request that you comment on his cardiac risk to proceed with  revision of right total knee arthroplasty on 11.4.2025.   Pharmacy has made recommendations below: DCCV within the last 4 weeks or a watchman implanted in the last 45 days Will need for primary cardiologist to review and determine when safe to proceed, and if a Lovenox bridge will be needed at such time.     Please send your comment to P CV Pre-Op Pool.  Thank you, Lamarr Satterfield DNP, ANP, AACC.

## 2024-02-24 NOTE — Telephone Encounter (Signed)
 Patient can be taken up for surgery with low risk. Hold Eliquis  for 2 days prior to surgery and restart post surgery at the earliest per surgical team.

## 2024-02-25 NOTE — Telephone Encounter (Signed)
   Patient Name: Megan Fischer  DOB: 30-Sep-1950 MRN: [id number]  Primary Cardiologist: Gordy Bergamo, MD  Chart reviewed as part of pre-operative protocol coverage.  Patient was last seen on 12/18/2023 by Dr. Bergamo.  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  On further discussion with Dr. Ganji given recent stroke patient is at high risk and should not undergo surgery for at least 3 months.  Suspected that there is also atherosclerotic disease.  Patient will always be high risk for stopping anticoagulation if she needs to have surgery.  Bridging with Lovenox was not recommended by Dr. Bergamo.  Per Dr. Bergamo patient should not undergo surgery for at least 3 months.  Would recommend that patient have an office appointment with Dr. Bergamo prior to rescheduled procedure.  I will route this recommendation to the requesting party via Epic fax function and remove from pre-op pool.  Please call with questions.  Megan Fischer D Tyshell Ramberg, NP 02/25/2024, 3:55 PM

## 2024-02-26 NOTE — Telephone Encounter (Signed)
 Tried to call the pt to schedule a 3 month f/u per preop APP Rosabel ORN. NP, see notes in chart.   No answer. Pt needs to see Dr. Ladona in 3 months.

## 2024-02-26 NOTE — Telephone Encounter (Signed)
 Per preop APP I will let the requesting office know of the recommendations from Dr. Ladona.       Patient Name: Megan Fischer  DOB: 08-14-1950 MRN: [id number]   Primary Cardiologist: Gordy Ladona, MD   Chart reviewed as part of pre-operative protocol coverage.  Patient was last seen on 12/18/2023 by Dr. Ladona.  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   On further discussion with Dr. Ganji given recent stroke patient is at high risk and should not undergo surgery for at least 3 months.  Suspected that there is also atherosclerotic disease.  Patient will always be high risk for stopping anticoagulation if she needs to have surgery.  Bridging with Lovenox was not recommended by Dr. Ladona.   Per Dr. Ladona patient should not undergo surgery for at least 3 months.  Would recommend that patient have an office appointment with Dr. Ladona prior to rescheduled procedure.   I will route this recommendation to the requesting party via Epic fax function and remove from pre-op pool.   Please call with questions.   Katlyn D West, NP 02/25/2024, 3:55 PM

## 2024-02-26 NOTE — Telephone Encounter (Signed)
 A new clearance request will need to be faxed to us  when ready to reschedule surgery. Fax# [phone] attn: preop team

## 2024-02-27 ENCOUNTER — Telehealth: Payer: Self-pay

## 2024-02-27 NOTE — Telephone Encounter (Signed)
 Verbal okay given to [facility] for In home physical therapy. Visits to be  once a week for 8 weeks. [facility] [place] [phone]).

## 2024-02-28 ENCOUNTER — Telehealth: Payer: Self-pay | Admitting: *Deleted

## 2024-02-28 ENCOUNTER — Ambulatory Visit

## 2024-02-28 VITALS — BP 177/94 | HR 93 | Temp 97.9°F | Resp 24 | Ht 63.0 in | Wt 178.0 lb

## 2024-02-28 DIAGNOSIS — Z7984 Long term (current) use of oral hypoglycemic drugs: Secondary | ICD-10-CM

## 2024-02-28 DIAGNOSIS — I10 Essential (primary) hypertension: Secondary | ICD-10-CM

## 2024-02-28 DIAGNOSIS — I4891 Unspecified atrial fibrillation: Secondary | ICD-10-CM

## 2024-02-28 DIAGNOSIS — L299 Pruritus, unspecified: Secondary | ICD-10-CM

## 2024-02-28 DIAGNOSIS — Z7985 Long-term (current) use of injectable non-insulin antidiabetic drugs: Secondary | ICD-10-CM

## 2024-02-28 DIAGNOSIS — E7800 Pure hypercholesterolemia, unspecified: Secondary | ICD-10-CM

## 2024-02-28 DIAGNOSIS — I63531 Cerebral infarction due to unspecified occlusion or stenosis of right posterior cerebral artery: Secondary | ICD-10-CM

## 2024-02-28 DIAGNOSIS — M25512 Pain in left shoulder: Secondary | ICD-10-CM

## 2024-02-28 DIAGNOSIS — E1129 Type 2 diabetes mellitus with other diabetic kidney complication: Secondary | ICD-10-CM

## 2024-02-28 MED ORDER — CAMPHOR-MENTHOL 0.5-0.5 % EX LOTN: TOPICAL_LOTION | CUTANEOUS | 0 refills | Status: DC | PRN

## 2024-02-28 MED ORDER — TRAMADOL HCL 50 MG PO TABS: 50.0000 mg | ORAL_TABLET | Freq: Four times a day (QID) | ORAL | 0 refills | Status: AC | PRN

## 2024-02-28 MED ORDER — LANCET DEVICE MISC: 1.0000 | 0 refills | Status: DC

## 2024-02-28 MED ORDER — BLOOD GLUCOSE TEST VI STRP: 1.0000 | ORAL_STRIP | 0 refills | Status: DC

## 2024-02-28 MED ORDER — ROSUVASTATIN CALCIUM 20 MG PO TABS: 20.0000 mg | ORAL_TABLET | Freq: Every day | ORAL | 0 refills | Status: DC

## 2024-02-28 MED ORDER — ASPIRIN 81 MG PO TBEC: 81.0000 mg | DELAYED_RELEASE_TABLET | Freq: Every day | ORAL | 0 refills | Status: DC

## 2024-02-28 MED ORDER — BLOOD GLUCOSE MONITORING SUPPL DEVI: 1.0000 | 0 refills | Status: DC

## 2024-02-28 MED ORDER — DICLOFENAC SODIUM 1 % EX GEL: 2.0000 g | Freq: Four times a day (QID) | CUTANEOUS | 0 refills | Status: AC

## 2024-02-28 MED ORDER — LANCETS MISC: 1.0000 | 0 refills | Status: DC

## 2024-02-28 MED ORDER — APIXABAN 5 MG PO TABS: 5.0000 mg | ORAL_TABLET | Freq: Two times a day (BID) | ORAL | 2 refills | Status: DC

## 2024-02-28 MED ORDER — GABAPENTIN 100 MG PO CAPS: 100.0000 mg | ORAL_CAPSULE | Freq: Three times a day (TID) | ORAL | 0 refills | Status: DC

## 2024-02-28 MED ORDER — CELECOXIB 200 MG PO CAPS: 200.0000 mg | ORAL_CAPSULE | Freq: Every day | ORAL | 0 refills | Status: DC | PRN

## 2024-02-28 MED ORDER — AMLODIPINE BESYLATE 2.5 MG PO TABS: 2.5000 mg | ORAL_TABLET | Freq: Every day | ORAL | 0 refills | Status: DC

## 2024-02-28 NOTE — Telephone Encounter (Addendum)
 I talked to Dr Isobel - stated pt needs the Eliquis  and Celebrex is as needed,ok for pt to take. But will hold ASA.   Return call to [facility] - I talked to Ben,pharmacist, these instructions were given.

## 2024-02-28 NOTE — Telephone Encounter (Signed)
 Patient did not answer, could not leave message. Will try again later.

## 2024-02-28 NOTE — Progress Notes (Signed)
 Established Patient Office Visit  Subjective   Patient ID: Megan Fischer, female    DOB: [birth year]  Age: 73 y.o. MRN: [id number]  Chief Complaint  Patient presents with   Follow-up    HFU   Medication Refill    Megan Fischer is a 73 year-old-female with a past medical history of atrial fibrillation on Eliquis , hypertension, hypothyroidism, CKD stage IIIb and type 2 diabetes, and a recent acute right PCA infarct who presents today for a hospital follow up and medication refills. Please see problem-based assessment and plan below for details.     Follow up Hospitalization  Patient was admitted to [facility] hospital on 9/15 and discharged on 9/18 from IMTS, then was admitted to [facility] and discharged from CIR on 10/10.  She was treated for acute CVA due to occlusion of left posterior cerebral artery. Treatment for this included Plavix , as the patient was outside the window for TPA, and the location of the clot was deemed unsafe for thrombectomy. A1c 6.5% and LDL 123 and total cholesterol 232. Treated with permissive hypertension for 48 hours. Worked with speech, PT/OT, and CIR.  Telephone follow up was done on N/A (patient went to CIR) She reports fair compliance with treatment. She is currently out of medications.  She reports this condition is improved.  ----------------------------------------------------------------------------------------- -   Review of Systems  Constitutional: Negative.   Eyes: Negative.   Respiratory: Negative.    Cardiovascular: Negative.   Gastrointestinal: Negative.   Genitourinary: Negative.   Musculoskeletal:  Positive for myalgias.       Shoulder pain  Neurological:  Positive for headaches.  Psychiatric/Behavioral: Negative.        Objective:    BP (!) 177/94 (BP Location: Left Arm, Cuff Size: Large)   Pulse 93   Temp 97.9 F (36.6 C) (Oral)   Resp (!) 24   Ht 5' 3 (1.6 m)   Wt 178 lb (80.7 kg)   SpO2 93% Comment: room air  BMI 31.53 kg/m    Physical Exam Constitutional:      Appearance: Normal appearance.  Cardiovascular:     Rate and Rhythm: Normal rate. Rhythm irregular.     Pulses: Normal pulses.     Heart sounds: Normal heart sounds.  Pulmonary:     Effort: Pulmonary effort is normal.     Breath sounds: Normal breath sounds.  Abdominal:     General: Abdomen is flat.     Palpations: Abdomen is soft.  Musculoskeletal:        General: Tenderness present. No swelling.     Comments: 4+ /5 grip strength LUE, 5/5 on RUE. TTP to bilateral shoulders at [facility] joint.  Skin:    General: Skin is warm.  Neurological:     General: No focal deficit present.     Mental Status: She is alert and oriented to person, place, and time.  Psychiatric:        Mood and Affect: Mood normal.        Behavior: Behavior normal.    No results found for any visits on 02/28/24.   The ASCVD Risk score (Arnett DK, et al., 2019) failed to calculate for the following reasons:   Risk score cannot be calculated because patient has a medical history suggesting prior/existing ASCVD    Assessment & Plan:   Patient seen with Dr. Jeanelle.   Problem List Items Addressed This Visit       Cardiovascular and Mediastinum   Atrial fibrillation (HCC) (  Chronic)   Chronic, follows with Dr. Ladona at cardiology. Patient on home Eliquis  5mg  BID and Metoprolol  succinate 50mg  daily. Recently admitted to [facility] ED for acute right PCA stroke, which was thought to be secondary to her atrial fibrillation and large vessel disease. Resumed on Eliquis  5mg  BID and started on ASA 81mg . Discussed with Dr. Jeanelle about DAPT therapy in this patient, However, will hold aspirin  while trialing Celebrex for concurrent OA pain due to increased bleeding risk.  - Continue Eliquis  5mg  BID, metoprolol  succinate 50mg  daily - Hold ASA 81mg  daily due to increased bleeding risk - Patient to follow up with cardiology to discuss atrial fibrillation and blood pressure      Relevant  Medications   amLODipine  (NORVASC ) 2.5 MG tablet   aspirin  EC 81 MG tablet   apixaban  (ELIQUIS ) 5 MG TABS tablet   rosuvastatin  (CRESTOR ) 20 MG tablet   Other Relevant Orders   CBC with Diff   Hypertension   BP 177/94 today.  Patient present with her son, who is acting as her caretaker.  Both state that she has not taken her antihypertensives today, which is inclusive of Amlodipine  2.5 mg daily and her Benazepril  20 mg daily.  During admission, IMTS placed patient on amlodipine  10 mg daily, however while in CIR, PM&R reduced dosage to 2.5 mg because her blood pressures were  trending downwards. She currently does not have a blood pressure cuff at home and does not check her blood pressure regularly.  Discussed the importance of checking her blood pressure, especially to prevent systolic blood pressures rising above 140 and preventing further strokes from occurring.  - Patient will keep diary for next 2 weeks, and record blood pressures to bring back to clinic - If SBP persistently >140, will consider changing amlodipine  back to 10 mg daily, or switching to thiazide diuretic with benazepril  combination pill to reduce pill burden. - BMP today to assess renal function.  If improving, consider restarting Jardiance  and/or furosemide  if patient is with consistent edema      Relevant Medications   amLODipine  (NORVASC ) 2.5 MG tablet   aspirin  EC 81 MG tablet   apixaban  (ELIQUIS ) 5 MG TABS tablet   rosuvastatin  (CRESTOR ) 20 MG tablet   Acute ischemic right PCA stroke [facility])   Patient presented to [facility] ED on 9/15 for left side weakness and right gaze preference. CT and MRI showed new right-sided PCA stroke and her right ICA was occluded. She was outside of the window for TPA, and the location of the clot was unsafe for thrombectomy. Echo was negative for PFO or clot. Patient on home Eliquis , which was held for 72 hours during admission and her blood pressure medications were held for 48 hours for  permissive hypertension. Neurology believed this stroke was secondary to her atrial fibrillation and large vessel disease. Patient admitted and discharged from [facility] inpatient rehab, which greatly benefited her and would continue to benefit from [facility] PT/OT and speech therapy. Resumed on Eliquis  5mg  BID and started on ASA 81mg . Discussed with Dr. Jeanelle about DAPT therapy in this patient, However, will hold aspirin  while trialing Celebrex for concurrent OA pain due to increased bleeding risk.  - Continue Eliquis  5mg  BID, metoprolol  succinate 50mg  daily - Hold ASA 81mg  daily due to increased bleeding risk - Patient would benefit from CAP with [facility] for continued level of care due to level of stroke and desire to improve      Relevant Medications   amLODipine  (NORVASC ) 2.5  MG tablet   aspirin  EC 81 MG tablet   apixaban  (ELIQUIS ) 5 MG TABS tablet   rosuvastatin  (CRESTOR ) 20 MG tablet     Endocrine   DM (diabetes mellitus) type II controlled with renal manifestation (HCC) - Primary   Chronic, controlled. Most recent A1c checked on 9/15 6.5%, which is improved from 7.8% in 10/2023. Current regimen includes Glimepiride  1mg  daily, Semaglutide  2mg  injections weekly, and Jardiance  25mg  daily. Jardiance  was held at discharge due to AKI while in CIR. Will resume Jardiance  if BMP today shows improvement. Patient's son has been helping with patient's diet by limiting sugary drinks and foods high in carbohydrates, which are shown through big improvement in A1c. Patient requesting new glucometer and testing supplies to be sent to pharmacy to better check her BG.  - BMP - A1c in 2 months - Continue current regimen, will call patient back if BMP shows improvement in Cr/BUN to restart Jardiance       Relevant Medications   aspirin  EC 81 MG tablet   gabapentin  (NEURONTIN ) 100 MG capsule   Blood Glucose Monitoring Suppl DEVI   Glucose Blood (BLOOD GLUCOSE TEST STRIPS) STRP   Lancet Device MISC    Lancets MISC   rosuvastatin  (CRESTOR ) 20 MG tablet   Other Relevant Orders   Basic metabolic panel with GFR     Other   Hypercholesterolemia   Patient historically on Zetia  10mg  and Rosuvastatin  10mg  daily for cholesterol management. During 9/15 admission, found to have LDL 123 and cholesterol 232. Rosuvastatin  dose increased to 20mg , of which I will continue today. LDL goal <70 for secondary prevention after acute CVA. Discussed with patient and son importance of diet and elimination of fatty/greasy foods from the diet.  - Continue Rosuvastatin  20mg  daily, Zetia  10mg  daily - Lipid panel recheck in 2 months - If not at goal, increase Rosuvastatin  to 40mg       Relevant Medications   amLODipine  (NORVASC ) 2.5 MG tablet   aspirin  EC 81 MG tablet   apixaban  (ELIQUIS ) 5 MG TABS tablet   rosuvastatin  (CRESTOR ) 20 MG tablet   Left shoulder pain   History of osteoarthritis in knees, shoulders.  While in CIR, shoulder x-rays were done showing narrowing of subacromial joint space seen with rotator cuff pathology.  Evidence of arthritis.  Patient was trialed on Voltaren  gel, lidocaine patches, Tylenol , and tramadol  50mg . Patient received initial trial of Tramadol  50mg  for 5 days, which she thinks may help but has been having difficulty remembering.  Discussed multimodal pain regimen with patient and her son, and how we want to prevent fluctuance and patient's blood pressures as well as keeping her blood pressure is controlled. Will send prescription for Celebrex 200mg  and Tramadol  50mg  for 10 days. Informed son to not give all pain medications at one time, but rather see which modality works best for the patient so we can best treat her OA pain. Both patient and son voiced understanding. If no improvement, discussed steroid injections of the shoulder and further workup can be done as well.   - Continue topical voltaren  gel  - Celebrex 200mg  PRN for pain - Tramadol  50mg , q6hr PRN for 10 days for moderate  pain  - Patient to follow up in 2 weeks      Relevant Medications   diclofenac  Sodium (VOLTAREN ) 1 % GEL   celecoxib (CELEBREX) 200 MG capsule   traMADol  (ULTRAM ) 50 MG tablet   Other Visit Diagnoses       Itching  Relevant Medications   camphor-menthol  (SARNA) lotion       Return in about 2 weeks (around 03/13/2024) for Bp check .    Branon Sabine, DO Internal Medicine Resident, PGY-1 4:43 PM 02/28/2024

## 2024-02-28 NOTE — Assessment & Plan Note (Addendum)
 Chronic, follows with Dr. Ladona at cardiology. Patient on home Eliquis  5mg  BID and Metoprolol  succinate 50mg  daily. Recently admitted to [facility] ED for acute right PCA stroke, which was thought to be secondary to her atrial fibrillation and large vessel disease. Resumed on Eliquis  5mg  BID and started on ASA 81mg . Discussed with Dr. Jeanelle about DAPT therapy in this patient, However, will hold aspirin  while trialing Celebrex for concurrent OA pain due to increased bleeding risk.  - Continue Eliquis  5mg  BID, metoprolol  succinate 50mg  daily - Hold ASA 81mg  daily due to increased bleeding risk - Patient to follow up with cardiology to discuss atrial fibrillation and blood pressure

## 2024-02-28 NOTE — Assessment & Plan Note (Addendum)
 Patient presented to [facility] ED on 9/15 for left side weakness and right gaze preference. CT and MRI showed new right-sided PCA stroke and her right ICA was occluded. She was outside of the window for TPA, and the location of the clot was unsafe for thrombectomy. Echo was negative for PFO or clot. Patient on home Eliquis , which was held for 72 hours during admission and her blood pressure medications were held for 48 hours for permissive hypertension. Neurology believed this stroke was secondary to her atrial fibrillation and large vessel disease. Patient admitted and discharged from [facility] inpatient rehab, which greatly benefited her and would continue to benefit from [facility] PT/OT and speech therapy. Resumed on Eliquis  5mg  BID and started on ASA 81mg . Discussed with Dr. Jeanelle about DAPT therapy in this patient, However, will hold aspirin  while trialing Celebrex for concurrent OA pain due to increased bleeding risk.  - Continue Eliquis  5mg  BID, metoprolol  succinate 50mg  daily - Hold ASA 81mg  daily due to increased bleeding risk - Patient would benefit from CAP with [facility] for continued level of care due to level of stroke and desire to improve

## 2024-02-28 NOTE — Assessment & Plan Note (Addendum)
 Chronic, controlled. Most recent A1c checked on 9/15 6.5%, which is improved from 7.8% in 10/2023. Current regimen includes Glimepiride  1mg  daily, Semaglutide  2mg  injections weekly, and Jardiance  25mg  daily. Jardiance  was held at discharge due to AKI while in CIR. Will resume Jardiance  if BMP today shows improvement. Patient's son has been helping with patient's diet by limiting sugary drinks and foods high in carbohydrates, which are shown through big improvement in A1c. Patient requesting new glucometer and testing supplies to be sent to pharmacy to better check her BG.  - BMP - A1c in 2 months - Continue current regimen, will call patient back if BMP shows improvement in Cr/BUN to restart Jardiance 

## 2024-02-28 NOTE — Assessment & Plan Note (Addendum)
 History of osteoarthritis in knees, shoulders.  While in CIR, shoulder x-rays were done showing narrowing of subacromial joint space seen with rotator cuff pathology.  Evidence of arthritis.  Patient was trialed on Voltaren  gel, lidocaine patches, Tylenol , and tramadol  50mg . Patient received initial trial of Tramadol  50mg  for 5 days, which she thinks may help but has been having difficulty remembering.  Discussed multimodal pain regimen with patient and her son, and how we want to prevent fluctuance and patient's blood pressures as well as keeping her blood pressure is controlled. Will send prescription for Celebrex 200mg  and Tramadol  50mg  for 10 days. Informed son to not give all pain medications at one time, but rather see which modality works best for the patient so we can best treat her OA pain. Both patient and son voiced understanding. If no improvement, discussed steroid injections of the shoulder and further workup can be done as well.   - Continue topical voltaren  gel  - Celebrex 200mg  PRN for pain - Tramadol  50mg , q6hr PRN for 10 days for moderate pain  - Patient to follow up in 2 weeks

## 2024-02-28 NOTE — Assessment & Plan Note (Signed)
 Patient historically on Zetia  10mg  and Rosuvastatin  10mg  daily for cholesterol management. During 9/15 admission, found to have LDL 123 and cholesterol 232. Rosuvastatin  dose increased to 20mg , of which I will continue today. LDL goal <70 for secondary prevention after acute CVA. Discussed with patient and son importance of diet and elimination of fatty/greasy foods from the diet.  - Continue Rosuvastatin  20mg  daily, Zetia  10mg  daily - Lipid panel recheck in 2 months - If not at goal, increase Rosuvastatin  to 40mg 

## 2024-02-28 NOTE — Telephone Encounter (Addendum)
 Received a call from [facility] pharmacist at [place]. He stated there's an increase bleeding risk with meds ASA Celebrex, and Eliquis  prescribed by Dr Isobel. Pt is currently at the pharmacy. Call back # [phone].

## 2024-02-28 NOTE — Patient Instructions (Addendum)
 Thank you, Ms.[name] for allowing us  to provide your care today. Today we discussed your most recent hospitalization and your medicine refill.  Please follow up with Dr. Ladona, who is your cardiologist (heart doctor) to talk about your atrial fibrillation and preventing future strokes  New medications: -Celebrex 200mg , take once daily as needed for arthritis pain -Camphor-Menthol  lotion (Sarna lotion) for itching  I have ordered the following labs for you:  Lab Orders         CBC with Diff         Basic metabolic panel with GFR       I will call if any are abnormal. All of your labs can be accessed through My Chart.   My Chart Access: [email or web address]  Please follow-up in: 2 weeks to check the blood pressure log    We look forward to seeing you next time. Please call our clinic at [phone] if you have any questions or concerns. The best time to call is Monday-Friday from 9am-4pm, but there is someone available 24/7. If after hours or the weekend, call the main hospital number and ask for the Internal Medicine Resident On-Call. If you need medication refills, please notify your pharmacy one week in advance and they will send us  a request.   Thank you for letting us  take part in your care. Wishing you the best!  Persephonie Hegwood, DO Internal Medicine Resident, PGY-1 10:58 AM 02/28/2024

## 2024-02-28 NOTE — Assessment & Plan Note (Signed)
 BP 177/94 today.  Patient present with her son, who is acting as her caretaker.  Both state that she has not taken her antihypertensives today, which is inclusive of Amlodipine  2.5 mg daily and her Benazepril  20 mg daily.  During admission, IMTS placed patient on amlodipine  10 mg daily, however while in CIR, PM&R reduced dosage to 2.5 mg because her blood pressures were  trending downwards. She currently does not have a blood pressure cuff at home and does not check her blood pressure regularly.  Discussed the importance of checking her blood pressure, especially to prevent systolic blood pressures rising above 140 and preventing further strokes from occurring.  - Patient will keep diary for next 2 weeks, and record blood pressures to bring back to clinic - If SBP persistently >140, will consider changing amlodipine  back to 10 mg daily, or switching to thiazide diuretic with benazepril  combination pill to reduce pill burden. - BMP today to assess renal function.  If improving, consider restarting Jardiance  and/or furosemide  if patient is with consistent edema

## 2024-02-29 LAB — CBC WITH DIFFERENTIAL/PLATELET
Basophils Absolute: 0 x10E3/uL (ref 0.0–0.2)
Basos: 1 %
EOS (ABSOLUTE): 0 x10E3/uL (ref 0.0–0.4)
Eos: 0 %
Hematocrit: 39.5 % (ref 34.0–46.6)
Hemoglobin: 12.7 g/dL (ref 11.1–15.9)
Immature Grans (Abs): 0 x10E3/uL (ref 0.0–0.1)
Immature Granulocytes: 0 %
Lymphocytes Absolute: 1.4 x10E3/uL (ref 0.7–3.1)
Lymphs: 30 %
MCH: 29.4 pg (ref 26.6–33.0)
MCHC: 32.2 g/dL (ref 31.5–35.7)
MCV: 91 fL (ref 79–97)
Monocytes Absolute: 0.4 x10E3/uL (ref 0.1–0.9)
Monocytes: 9 %
Neutrophils Absolute: 2.8 x10E3/uL (ref 1.4–7.0)
Neutrophils: 60 %
Platelets: 231 x10E3/uL (ref 150–450)
RBC: 4.32 x10E6/uL (ref 3.77–5.28)
RDW: 13.4 % (ref 11.7–15.4)
WBC: 4.7 x10E3/uL (ref 3.4–10.8)

## 2024-02-29 LAB — BASIC METABOLIC PANEL WITH GFR
BUN/Creatinine Ratio: 19 (ref 12–28)
BUN: 22 mg/dL (ref 8–27)
CO2: 17 mmol/L — ABNORMAL LOW (ref 20–29)
Calcium: 10.3 mg/dL (ref 8.7–10.3)
Chloride: 107 mmol/L — ABNORMAL HIGH (ref 96–106)
Creatinine, Ser: 1.15 mg/dL — ABNORMAL HIGH (ref 0.57–1.00)
Glucose: 106 mg/dL — ABNORMAL HIGH (ref 70–99)
Potassium: 4.5 mmol/L (ref 3.5–5.2)
Sodium: 145 mmol/L — ABNORMAL HIGH (ref 134–144)
eGFR: 51 mL/min/1.73 — ABNORMAL LOW (ref >59)

## 2024-02-29 NOTE — Telephone Encounter (Signed)
 Our office has tried x 3 to reach the pt to schedule in office appt. I will update the requesting office the pt needs to call and schedule appt in office for preop clearance.

## 2024-02-29 NOTE — Telephone Encounter (Signed)
 2 nd attempt: Called patient to schedule in-office visit for cardiac clearance. No answer, left message on VM to contact our office

## 2024-03-03 ENCOUNTER — Telehealth: Payer: Self-pay

## 2024-03-03 NOTE — Telephone Encounter (Signed)
 I called both number on patient chart, no answer. Unable to leave message, vm is not setup. Form has been completed and ready for pick-up. Form is at the front desk.

## 2024-03-04 ENCOUNTER — Ambulatory Visit: Payer: Self-pay

## 2024-03-04 LAB — OPHTHALMOLOGY REPORT-SCANNED

## 2024-03-04 NOTE — Progress Notes (Signed)
 Bun and Scr improving since hospitalization discharge on 10/13. GFR 51. Patient can restart previous home medications Jardiance  and Lasix . Called and spoke with patient's son, who voiced understanding.

## 2024-03-05 ENCOUNTER — Ambulatory Visit: Payer: Self-pay | Admitting: Endocrinology

## 2024-03-06 ENCOUNTER — Other Ambulatory Visit: Payer: Self-pay | Admitting: Student

## 2024-03-06 DIAGNOSIS — M4807 Spinal stenosis, lumbosacral region: Secondary | ICD-10-CM

## 2024-03-06 DIAGNOSIS — M79604 Pain in right leg: Secondary | ICD-10-CM

## 2024-03-06 DIAGNOSIS — M545 Low back pain, unspecified: Secondary | ICD-10-CM

## 2024-03-06 NOTE — Telephone Encounter (Signed)
 Medication discontinued

## 2024-03-06 NOTE — Progress Notes (Signed)
 Internal Medicine Clinic Attending  I was physically present during the key portions of the resident provided service and participated in the medical decision making of patient's management care. I reviewed pertinent patient test results.  The assessment, diagnosis, and plan were formulated together and I agree with the documentation in the resident's note.  Given the addition of antiplatelet agents to DOAC for afib stroke prevention does not improve outcomes but does increase bleeding risk would stop Asa with addition of NSAID for shoulder pain which greatly impacts her QOL  Anisah Kuck, Layman CROME, MD

## 2024-03-10 ENCOUNTER — Encounter: Payer: Self-pay | Admitting: Radiology

## 2024-03-11 NOTE — Telephone Encounter (Signed)
 Message sent to [facility] to f/u with POA forms.  Copied from CRM [id number]. Topic: General - Other >> Mar 07, 2024  1:15 PM Chiquita SQUIBB wrote: Reason for CRM: Patients son is calling in asking if she can do POA forms at the office. Please contact the patients son Adryan Druckenmiller at [phone]

## 2024-03-12 NOTE — Progress Notes (Deleted)
 PATIENT: Megan Fischer DOB: 08-Jun-1950  REASON FOR VISIT: follow up HISTORY FROM: patient PRIMARY NEUROLOGIST: Dr. Rosemarie  No chief complaint on file.    HISTORY OF PRESENT ILLNESS: Today   Megan Fischer is a 73 y.o. female who has been followed in this office for ***. Returns today for follow-up.   Imaging:   CT head:IMPRESSION: Changes consistent with prior right PCA infarct. No acute abnormality is noted  CTA head/neckIMPRESSION: 1. 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 2. Proximal right P2 occlusion. 3. 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 4.  Aortic Atherosclerosis (ICD10-I70.0).  MRI Brain: IMPRESSION: 1. Moderately large acute right PCA infarct. 2. Mild chronic small vessel ischemic disease.     FU: Labs: Carotid dopplers? ECHO: Discharge note Work? OSA?   HISTORY (copied from Hospital)Megan Fischer is a 73 y.o. female with history of A-fib on Eliquis , diabetes, CKD dyslipidemia, hypertension, and chronic back pain admitted for left-sided weakness, left facial droop and right gaze preference, code stroke. 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    CTA head & neck showed occlusion of the right ICA at its origin with intracranial reconstitution, and proximal right P2 occlusion CT perfusion 9/42cc with 33 mL penumbra  MRI with moderate to large acute right PCA infarct and mild chronic small vessel disease 2D Echo EF 60-65%, mild LVH LDL 123 HgbA1c 6.5 UDS negative VTE prophylaxis -SCDs Eliquis  (apixaban ) daily prior to admission, restarting home Eliquis  and enteric-coated aspirin  today and continue on discharge Therapy recommendations:  CIR Disposition:  Pending   Atrial fibrillation Home Meds: Eliquis  and metoprolol  50 mg daily Continue telemetry monitoring  restart home Eliquis     Hypertension Home meds: Lotensin  20 mg daily, metoprolol  50 mg daily On home metoprolol  and Lotensin  Currently stable on high end Ready normalize BP in 1 to 2 days Long-term BP goal normotensive   Hyperlipidemia Home meds: Rosuvastatin  10 mg and Zetia  10 LDL 123, goal < 70 Increase Crestor  20 mg daily, continue Zetia  10 mg daily Continue statin and Zetia  at discharge   Diabetes type II Controlled Home meds: Semaglutide  2 mg weekly, glimepiride  1 mg daily, Jardiance  25 mg daily HgbA1c 6.5, goal < 7.0 CBGs SSI Recommend close follow-up with PCP for better DM control   Dysphagia Patient has post-stroke dysphagia, SLP following, likely CIR candidate Currently on carb modified diet   Other Stroke Risk Factors Obesity, Body mass index is 33.98 kg/m., BMI >/= 30 associated with increased stroke risk, recommend weight loss, diet and exercise as appropriate  PVD, CTA showed right ICA occlusion, likely chronic Advanced age   Other Active Problems Chronic low back pain -diclofenac  gel as needed, baseline bilateral lower extremity weakness Hyperthyroidism -continue Tapazole  5 mg daily CKD -creatinine 1.40--1.27--1.32--1.45, primary team following    REVIEW OF SYSTEMS: Out of a complete 14 system review of symptoms, the patient complains only of the following symptoms, and all other reviewed systems are negative.  ALLERGIES: Allergies  Allergen Reactions   Aspirin  Nausea Only    Patient stated to neuro that it caused an upset stomach   Codeine Other (See Comments)    Unknown    Fentanyl  Other (See Comments)  hypoxia   Oxycodone Hcl Other (See Comments)    Unknown    Penicillins Other (See Comments)    Unknown    Chlorhexidine Gluconate Rash    HOME MEDICATIONS: Outpatient Medications Prior to Visit  Medication Sig Dispense Refill    acetaminophen  (TYLENOL ) 325 MG tablet Take 1-2 tablets (325-650 mg total) by mouth every 4 (four) hours as needed for mild pain (pain score 1-3).     amLODipine  (NORVASC ) 2.5 MG tablet Take 1 tablet (2.5 mg total) by mouth daily. 30 tablet 0   apixaban  (ELIQUIS ) 5 MG TABS tablet Take 1 tablet (5 mg total) by mouth 2 (two) times daily. 200 tablet 2   aspirin  EC 81 MG tablet Take 1 tablet (81 mg total) by mouth daily. Swallow whole. 30 tablet 0   benazepril  (LOTENSIN ) 20 MG tablet TAKE 1 TABLET BY MOUTH DAILY 100 tablet 2   Blood Glucose Monitoring Suppl DEVI 1 each by Does not apply route as directed. Dispense based on patient and insurance preference. Use up to four times daily as directed. (FOR ICD-10 E10.9, E11.9). 1 each 0   CALCIUM  PO Take 1 tablet by mouth 2 (two) times daily.     camphor-menthol  (SARNA) lotion Apply topically as needed for itching. 222 mL 0   celecoxib (CELEBREX) 200 MG capsule Take 1 capsule (200 mg total) by mouth daily as needed for moderate pain (pain score 4-6). 30 capsule 0   diclofenac  Sodium (VOLTAREN ) 1 % GEL Apply 2 g topically 4 (four) times daily. To both knees 150 g 0   ezetimibe  (ZETIA ) 10 MG tablet Take 1 tablet (10 mg total) by mouth daily. 90 tablet 2   famotidine  (PEPCID ) 20 MG tablet Take 1 tablet (20 mg total) by mouth daily. 30 tablet 0   ferrous sulfate  325 (65 FE) MG EC tablet Take 1 tablet (325 mg total) by mouth 2 (two) times daily. (Patient taking differently: Take 325 mg by mouth daily.) 60 tablet 3   gabapentin  (NEURONTIN ) 100 MG capsule Take 1 capsule (100 mg total) by mouth 3 (three) times daily. 90 capsule 0   glimepiride  (AMARYL ) 1 MG tablet Take 1 tablet (1 mg total) by mouth daily with breakfast. 90 tablet 4   Glucose Blood (BLOOD GLUCOSE TEST STRIPS) STRP 1 each by Does not apply route as directed. Dispense based on patient and insurance preference. Use up to four times daily as directed. (FOR ICD-10 E10.9, E11.9). 100 strip 0   Lancet Device  MISC 1 each by Does not apply route as directed. Dispense based on patient and insurance preference. Use up to four times daily as directed. (FOR ICD-10 E10.9, E11.9). 1 each 0   Lancets MISC 1 each by Does not apply route as directed. Dispense based on patient and insurance preference. Use up to four times daily as directed. (FOR ICD-10 E10.9, E11.9). 100 each 0   lidocaine (LIDODERM) 5 % Place 2 patches onto the skin daily. Remove & Discard patch within 12 hours or as directed by MD 60 patch 0   methimazole  (TAPAZOLE ) 5 MG tablet TAKE 1 TABLET BY MOUTH DAILY 100 tablet 2   methocarbamol  (ROBAXIN ) 500 MG tablet Take 1 tablet (500 mg total) by mouth every 6 (six) hours as needed for muscle spasms. 30 tablet 0   metoprolol  succinate (TOPROL -XL) 50 MG 24 hr tablet TAKE 1 TABLET BY MOUTH DAILY  WITH OR IMMEDIATELY FOLLOWING A  MEAL 90 tablet 3   polyethylene glycol powder (GLYCOLAX /MIRALAX )  17 GM/SCOOP powder Dissolve 1 capful (17g) in 4-8 ounces of liquid and take by mouth daily. 238 g 0   rosuvastatin  (CRESTOR ) 20 MG tablet Take 1 tablet (20 mg total) by mouth daily after supper. 30 tablet 0   Semaglutide , 2 MG/DOSE, (OZEMPIC , 2 MG/DOSE,) 8 MG/3ML SOPN INJECT SUBCUTANEOUSLY 2 MG EVERY WEEK 9 mL 3   senna-docusate (SENOKOT-S) 8.6-50 MG tablet Take 2 tablets by mouth at bedtime. 60 tablet 0   simethicone (MYLICON) 80 MG chewable tablet Chew 2 tablets (160 mg total) by mouth 4 (four) times daily. 120 tablet 0   No facility-administered medications prior to visit.    PAST MEDICAL HISTORY: Past Medical History:  Diagnosis Date   Atrial fibrillation (HCC)    Chronic disease anemia 03/29/2011   Diabetes mellitus    Dyslipidemia    Hypertension    Obesities, morbid (HCC)    Osteoarthritis     PAST SURGICAL HISTORY: Past Surgical History:  Procedure Laterality Date   CHOLECYSTECTOMY     REPLACEMENT TOTAL KNEE BILATERAL     SHOULDER SURGERY     RIGHT SHOULDER   TUBAL LIGATION      FAMILY  HISTORY: Family History  Problem Relation Age of Onset   Heart attack Mother    Stroke Mother    Diabetes Mother    Hypertension Brother    Breast cancer Neg Hx     SOCIAL HISTORY: Social History   Socioeconomic History   Marital status: Married    Spouse name: Not on file   Number of children: Not on file   Years of education: Not on file   Highest education level: Not on file  Occupational History   Not on file  Tobacco Use   Smoking status: Never   Smokeless tobacco: Never  Vaping Use   Vaping status: Never Used  Substance and Sexual Activity   Alcohol use: No   Drug use: No   Sexual activity: Not Currently  Other Topics Concern   Not on file  Social History Narrative   Not on file   Social Drivers of Health   Financial Resource Strain: Low Risk  (10/19/2023)   Received from [facility]   Overall Financial Resource Strain (CARDIA)    Difficulty of Paying Living Expenses: Not hard at all  Food Insecurity: No Food Insecurity (01/21/2024)   Hunger Vital Sign    Worried About Running Out of Food in the Last Year: Never true    Ran Out of Food in the Last Year: Never true  Transportation Needs: No Transportation Needs (01/21/2024)   PRAPARE - Administrator, Civil Service (Medical): No    Lack of Transportation (Non-Medical): No  Physical Activity: Insufficiently Active (03/01/2023)   Exercise Vital Sign    Days of Exercise per Week: 7 days    Minutes of Exercise per Session: 20 min  Stress: No Stress Concern Present (03/01/2023)   [employer] of Occupational Health - Occupational Stress Questionnaire    Feeling of Stress : Not at all  Social Connections: Unknown (01/21/2024)   Social Connection and Isolation Panel    Frequency of Communication with Friends and Family: More than three times a week    Frequency of Social Gatherings with Friends and Family: Once a week    Attends Religious Services: Not on file    Active Member  of Clubs or Organizations: Not on file    Attends Banker Meetings: Not on  file    Marital Status: Not on file  Intimate Partner Violence: Unknown (01/21/2024)   Humiliation, Afraid, Rape, and Kick questionnaire    Fear of Current or Ex-Partner: No    Emotionally Abused: No    Physically Abused: Not on file    Sexually Abused: No      PHYSICAL EXAM  There were no vitals filed for this visit. There is no height or weight on file to calculate BMI.  Generalized: Well developed, in no acute distress   Neurological examination  Mentation: Alert oriented to time, place, history taking. Follows all commands speech and language fluent Cranial nerve II-XII: Pupils were equal round reactive to light. Extraocular movements were full, visual field were full on confrontational test. Facial sensation and strength were normal. Uvula tongue midline. Head turning and shoulder shrug  were normal and symmetric. Motor: The motor testing reveals 5 over 5 strength of all 4 extremities. Good symmetric motor tone is noted throughout.  Sensory: Sensory testing is intact to soft touch on all 4 extremities. No evidence of extinction is noted.  Coordination: Cerebellar testing reveals good finger-nose-finger and heel-to-shin bilaterally.  Gait and station: Gait is normal. Tandem gait is normal. Romberg is negative. No drift is seen.  Reflexes: Deep tendon reflexes are symmetric and normal bilaterally.   DIAGNOSTIC DATA (LABS, IMAGING, TESTING) - I reviewed patient records, labs, notes, testing and imaging myself where available.  Lab Results  Component Value Date   WBC 4.7 02/28/2024   HGB 12.7 02/28/2024   HCT 39.5 02/28/2024   MCV 91 02/28/2024   PLT 231 02/28/2024      Component Value Date/Time   NA 145 (H) 02/28/2024 1120   NA 143 02/16/2017 0802   K 4.5 02/28/2024 1120   K 3.5 02/16/2017 0802   CL 107 (H) 02/28/2024 1120   CO2 17 (L) 02/28/2024 1120   CO2 26 02/16/2017 0802    GLUCOSE 106 (H) 02/28/2024 1120   GLUCOSE 219 (H) 02/18/2024 0043   GLUCOSE 92 02/16/2017 0802   BUN 22 02/28/2024 1120   BUN 17.2 02/16/2017 0802   CREATININE 1.15 (H) 02/28/2024 1120   CREATININE 1.33 (H) 10/12/2023 0859   CREATININE 0.9 02/16/2017 0802   CALCIUM  10.3 02/28/2024 1120   CALCIUM  9.9 02/16/2017 0802   PROT 8.1 01/25/2024 1303   PROT 7.5 02/16/2017 0802   ALBUMIN 3.7 01/25/2024 1303   ALBUMIN 3.9 02/16/2017 0802   AST 35 01/25/2024 1303   AST 25 08/08/2023 0813   AST 26 02/16/2017 0802   ALT 22 01/25/2024 1303   ALT 18 08/08/2023 0813   ALT 18 02/16/2017 0802   ALKPHOS 103 01/25/2024 1303   ALKPHOS 64 02/16/2017 0802   BILITOT 0.7 01/25/2024 1303   BILITOT 0.5 08/08/2023 0813   BILITOT 0.58 02/16/2017 0802   GFRNONAA 44 (L) 02/18/2024 0043   GFRNONAA 40 (L) 08/08/2023 0813   GFRAA >60 05/17/2018 0807   Lab Results  Component Value Date   CHOL 232 (H) 01/22/2024   HDL 94 01/22/2024   LDLCALC 123 (H) 01/22/2024   TRIG 77 01/22/2024   CHOLHDL 2.5 01/22/2024   Lab Results  Component Value Date   HGBA1C 6.5 (H) 01/22/2024   Lab Results  Component Value Date   VITAMINB12 1415 (H) 09/29/2010   Lab Results  Component Value Date   TSH 1.627 01/22/2024      ASSESSMENT AND PLAN 73 y.o. year old female  has a past medical history of  Atrial fibrillation (HCC), Chronic disease anemia (03/29/2011), Diabetes mellitus, Dyslipidemia, Hypertension, Obesities, morbid (HCC), and Osteoarthritis. here with ***     Continue {anticoagulants:31417}  and ***  for secondary stroke prevention.   Discussed secondary stroke prevention measures and importance of close PCP follow up for aggressive stroke risk factor management. I have gone over the pathophysiology of stroke, warning signs and symptoms, risk factors and their management in some detail with instructions to go to the closest emergency room for symptoms of concern. HTN: BP goal <130/90.  Stable on *** per  PCP HLD: LDL goal <70. Recent LDL ***.  DMII: A1c goal<7.0. Recent A1c ***.  Encouraged patient to monitor diet and encouraged exercise FU with our office ***  No orders of the defined types were placed in this encounter.  No orders of the defined types were placed in this encounter.     Duwaine Russell, MSN, NP-C 03/12/2024, 2:37 PM [facility] [address], [address] [place], [place] [address] [phone]

## 2024-03-13 ENCOUNTER — Encounter: Payer: Self-pay | Admitting: Adult Health

## 2024-03-13 ENCOUNTER — Inpatient Hospital Stay: Payer: Self-pay | Admitting: Adult Health

## 2024-03-17 ENCOUNTER — Encounter: Attending: Physical Medicine & Rehabilitation | Admitting: Physical Medicine & Rehabilitation

## 2024-03-18 ENCOUNTER — Emergency Department (HOSPITAL_COMMUNITY)

## 2024-03-18 ENCOUNTER — Ambulatory Visit: Payer: Self-pay

## 2024-03-18 ENCOUNTER — Other Ambulatory Visit: Payer: Self-pay

## 2024-03-18 ENCOUNTER — Inpatient Hospital Stay (HOSPITAL_COMMUNITY)
Admission: EM | Admit: 2024-03-18 | Discharge: 2024-03-29 | DRG: 811 | Disposition: A | Discharge status: Rehabilitation Facility | Attending: Infectious Diseases | Admitting: Infectious Diseases

## 2024-03-18 ENCOUNTER — Observation Stay (HOSPITAL_COMMUNITY)

## 2024-03-18 DIAGNOSIS — R112 Nausea with vomiting, unspecified: Secondary | ICD-10-CM

## 2024-03-18 DIAGNOSIS — H5462 Unqualified visual loss, left eye, normal vision right eye: Secondary | ICD-10-CM | POA: Diagnosis present

## 2024-03-18 DIAGNOSIS — E1110 Type 2 diabetes mellitus with ketoacidosis without coma: Secondary | ICD-10-CM | POA: Diagnosis present

## 2024-03-18 DIAGNOSIS — I519 Heart disease, unspecified: Secondary | ICD-10-CM

## 2024-03-18 DIAGNOSIS — T508X5A Adverse effect of diagnostic agents, initial encounter: Secondary | ICD-10-CM | POA: Diagnosis present

## 2024-03-18 DIAGNOSIS — I3481 Nonrheumatic mitral (valve) annulus calcification: Secondary | ICD-10-CM | POA: Diagnosis present

## 2024-03-18 DIAGNOSIS — D6859 Other primary thrombophilia: Secondary | ICD-10-CM | POA: Diagnosis present

## 2024-03-18 DIAGNOSIS — Z79899 Other long term (current) drug therapy: Secondary | ICD-10-CM

## 2024-03-18 DIAGNOSIS — F419 Anxiety disorder, unspecified: Secondary | ICD-10-CM | POA: Diagnosis present

## 2024-03-18 DIAGNOSIS — R519 Headache, unspecified: Principal | ICD-10-CM | POA: Diagnosis present

## 2024-03-18 DIAGNOSIS — E0591 Thyrotoxicosis, unspecified with thyrotoxic crisis or storm: Secondary | ICD-10-CM | POA: Insufficient documentation

## 2024-03-18 DIAGNOSIS — E860 Dehydration: Secondary | ICD-10-CM | POA: Diagnosis present

## 2024-03-18 DIAGNOSIS — E669 Obesity, unspecified: Secondary | ICD-10-CM | POA: Diagnosis present

## 2024-03-18 DIAGNOSIS — E1122 Type 2 diabetes mellitus with diabetic chronic kidney disease: Secondary | ICD-10-CM | POA: Diagnosis present

## 2024-03-18 DIAGNOSIS — I071 Rheumatic tricuspid insufficiency: Secondary | ICD-10-CM | POA: Diagnosis present

## 2024-03-18 DIAGNOSIS — N12 Tubulo-interstitial nephritis, not specified as acute or chronic: Secondary | ICD-10-CM | POA: Diagnosis present

## 2024-03-18 DIAGNOSIS — I63511 Cerebral infarction due to unspecified occlusion or stenosis of right middle cerebral artery: Secondary | ICD-10-CM | POA: Diagnosis not present

## 2024-03-18 DIAGNOSIS — I69354 Hemiplegia and hemiparesis following cerebral infarction affecting left non-dominant side: Secondary | ICD-10-CM

## 2024-03-18 DIAGNOSIS — R233 Spontaneous ecchymoses: Secondary | ICD-10-CM | POA: Diagnosis present

## 2024-03-18 DIAGNOSIS — R569 Unspecified convulsions: Secondary | ICD-10-CM | POA: Diagnosis present

## 2024-03-18 DIAGNOSIS — E0590 Thyrotoxicosis, unspecified without thyrotoxic crisis or storm: Secondary | ICD-10-CM | POA: Diagnosis not present

## 2024-03-18 DIAGNOSIS — E869 Volume depletion, unspecified: Secondary | ICD-10-CM | POA: Diagnosis present

## 2024-03-18 DIAGNOSIS — I10 Essential (primary) hypertension: Secondary | ICD-10-CM | POA: Diagnosis present

## 2024-03-18 DIAGNOSIS — N183 Chronic kidney disease, stage 3 unspecified: Secondary | ICD-10-CM | POA: Diagnosis present

## 2024-03-18 DIAGNOSIS — R9401 Abnormal electroencephalogram [EEG]: Secondary | ICD-10-CM | POA: Diagnosis present

## 2024-03-18 DIAGNOSIS — Z888 Allergy status to other drugs, medicaments and biological substances status: Secondary | ICD-10-CM

## 2024-03-18 DIAGNOSIS — Z833 Family history of diabetes mellitus: Secondary | ICD-10-CM

## 2024-03-18 DIAGNOSIS — G928 Other toxic encephalopathy: Secondary | ICD-10-CM | POA: Diagnosis present

## 2024-03-18 DIAGNOSIS — R1013 Epigastric pain: Secondary | ICD-10-CM | POA: Diagnosis present

## 2024-03-18 DIAGNOSIS — E872 Acidosis, unspecified: Secondary | ICD-10-CM | POA: Insufficient documentation

## 2024-03-18 DIAGNOSIS — Z7901 Long term (current) use of anticoagulants: Secondary | ICD-10-CM

## 2024-03-18 DIAGNOSIS — E1129 Type 2 diabetes mellitus with other diabetic kidney complication: Secondary | ICD-10-CM | POA: Diagnosis present

## 2024-03-18 DIAGNOSIS — Z8673 Personal history of transient ischemic attack (TIA), and cerebral infarction without residual deficits: Secondary | ICD-10-CM

## 2024-03-18 DIAGNOSIS — Z886 Allergy status to analgesic agent status: Secondary | ICD-10-CM

## 2024-03-18 DIAGNOSIS — I959 Hypotension, unspecified: Secondary | ICD-10-CM | POA: Diagnosis present

## 2024-03-18 DIAGNOSIS — E0520 Thyrotoxicosis with toxic multinodular goiter without thyrotoxic crisis or storm: Secondary | ICD-10-CM | POA: Diagnosis present

## 2024-03-18 DIAGNOSIS — Z8249 Family history of ischemic heart disease and other diseases of the circulatory system: Secondary | ICD-10-CM

## 2024-03-18 DIAGNOSIS — E039 Hypothyroidism, unspecified: Secondary | ICD-10-CM | POA: Diagnosis present

## 2024-03-18 DIAGNOSIS — R4587 Impulsiveness: Secondary | ICD-10-CM | POA: Diagnosis present

## 2024-03-18 DIAGNOSIS — E8729 Other acidosis: Secondary | ICD-10-CM | POA: Insufficient documentation

## 2024-03-18 DIAGNOSIS — N184 Chronic kidney disease, stage 4 (severe): Secondary | ICD-10-CM | POA: Diagnosis present

## 2024-03-18 DIAGNOSIS — Z823 Family history of stroke: Secondary | ICD-10-CM

## 2024-03-18 DIAGNOSIS — Z86711 Personal history of pulmonary embolism: Secondary | ICD-10-CM

## 2024-03-18 DIAGNOSIS — I4821 Permanent atrial fibrillation: Secondary | ICD-10-CM | POA: Diagnosis present

## 2024-03-18 DIAGNOSIS — E11649 Type 2 diabetes mellitus with hypoglycemia without coma: Secondary | ICD-10-CM | POA: Diagnosis present

## 2024-03-18 DIAGNOSIS — Z515 Encounter for palliative care: Secondary | ICD-10-CM

## 2024-03-18 DIAGNOSIS — G9340 Encephalopathy, unspecified: Secondary | ICD-10-CM | POA: Insufficient documentation

## 2024-03-18 DIAGNOSIS — R29818 Other symptoms and signs involving the nervous system: Secondary | ICD-10-CM | POA: Diagnosis present

## 2024-03-18 DIAGNOSIS — Z7982 Long term (current) use of aspirin: Secondary | ICD-10-CM

## 2024-03-18 DIAGNOSIS — I2721 Secondary pulmonary arterial hypertension: Secondary | ICD-10-CM | POA: Diagnosis present

## 2024-03-18 DIAGNOSIS — Z6832 Body mass index (BMI) 32.0-32.9, adult: Secondary | ICD-10-CM

## 2024-03-18 DIAGNOSIS — I6521 Occlusion and stenosis of right carotid artery: Secondary | ICD-10-CM | POA: Diagnosis present

## 2024-03-18 DIAGNOSIS — Z7409 Other reduced mobility: Secondary | ICD-10-CM | POA: Diagnosis present

## 2024-03-18 DIAGNOSIS — I2693 Single subsegmental pulmonary embolism without acute cor pulmonale: Secondary | ICD-10-CM | POA: Diagnosis present

## 2024-03-18 DIAGNOSIS — E440 Moderate protein-calorie malnutrition: Secondary | ICD-10-CM | POA: Diagnosis present

## 2024-03-18 DIAGNOSIS — I5031 Acute diastolic (congestive) heart failure: Secondary | ICD-10-CM | POA: Diagnosis present

## 2024-03-18 DIAGNOSIS — I130 Hypertensive heart and chronic kidney disease with heart failure and stage 1 through stage 4 chronic kidney disease, or unspecified chronic kidney disease: Secondary | ICD-10-CM | POA: Diagnosis present

## 2024-03-18 DIAGNOSIS — Z66 Do not resuscitate: Secondary | ICD-10-CM | POA: Diagnosis present

## 2024-03-18 DIAGNOSIS — I2699 Other pulmonary embolism without acute cor pulmonale: Secondary | ICD-10-CM

## 2024-03-18 DIAGNOSIS — D631 Anemia in chronic kidney disease: Secondary | ICD-10-CM | POA: Diagnosis present

## 2024-03-18 DIAGNOSIS — R531 Weakness: Secondary | ICD-10-CM

## 2024-03-18 DIAGNOSIS — R1110 Vomiting, unspecified: Secondary | ICD-10-CM | POA: Diagnosis present

## 2024-03-18 DIAGNOSIS — R000 Tachycardia, unspecified: Secondary | ICD-10-CM | POA: Diagnosis present

## 2024-03-18 DIAGNOSIS — E785 Hyperlipidemia, unspecified: Secondary | ICD-10-CM | POA: Diagnosis present

## 2024-03-18 DIAGNOSIS — Z880 Allergy status to penicillin: Secondary | ICD-10-CM

## 2024-03-18 DIAGNOSIS — Z885 Allergy status to narcotic agent status: Secondary | ICD-10-CM

## 2024-03-18 DIAGNOSIS — Z96653 Presence of artificial knee joint, bilateral: Secondary | ICD-10-CM | POA: Diagnosis present

## 2024-03-18 DIAGNOSIS — R410 Disorientation, unspecified: Secondary | ICD-10-CM | POA: Insufficient documentation

## 2024-03-18 DIAGNOSIS — I4891 Unspecified atrial fibrillation: Secondary | ICD-10-CM | POA: Diagnosis present

## 2024-03-18 DIAGNOSIS — Z6831 Body mass index (BMI) 31.0-31.9, adult: Secondary | ICD-10-CM

## 2024-03-18 DIAGNOSIS — Z7984 Long term (current) use of oral hypoglycemic drugs: Secondary | ICD-10-CM

## 2024-03-18 DIAGNOSIS — I371 Nonrheumatic pulmonary valve insufficiency: Secondary | ICD-10-CM | POA: Diagnosis present

## 2024-03-18 DIAGNOSIS — G4700 Insomnia, unspecified: Secondary | ICD-10-CM | POA: Diagnosis present

## 2024-03-18 DIAGNOSIS — R441 Visual hallucinations: Secondary | ICD-10-CM | POA: Diagnosis present

## 2024-03-18 DIAGNOSIS — I63431 Cerebral infarction due to embolism of right posterior cerebral artery: Secondary | ICD-10-CM | POA: Diagnosis present

## 2024-03-18 DIAGNOSIS — K219 Gastro-esophageal reflux disease without esophagitis: Secondary | ICD-10-CM | POA: Diagnosis present

## 2024-03-18 DIAGNOSIS — Z794 Long term (current) use of insulin: Secondary | ICD-10-CM

## 2024-03-18 DIAGNOSIS — N179 Acute kidney failure, unspecified: Secondary | ICD-10-CM | POA: Insufficient documentation

## 2024-03-18 DIAGNOSIS — E874 Mixed disorder of acid-base balance: Secondary | ICD-10-CM | POA: Diagnosis present

## 2024-03-18 LAB — I-STAT CG4 LACTIC ACID, ED: Lactic Acid, Venous: 3.9 mmol/L (ref 0.5–1.9)

## 2024-03-18 LAB — BASIC METABOLIC PANEL WITH GFR
Anion gap: 14 (ref 5–15)
BUN: 18 mg/dL (ref 8–23)
CO2: 20 mmol/L — ABNORMAL LOW (ref 22–32)
Calcium: 9.4 mg/dL (ref 8.9–10.3)
Chloride: 106 mmol/L (ref 98–111)
Creatinine, Ser: 1.15 mg/dL — ABNORMAL HIGH (ref 0.44–1.00)
GFR, Estimated: 50 mL/min — ABNORMAL LOW (ref >60)
Glucose, Bld: 149 mg/dL — ABNORMAL HIGH (ref 70–99)
Potassium: 3.8 mmol/L (ref 3.5–5.1)
Sodium: 140 mmol/L (ref 135–145)

## 2024-03-18 LAB — CBC WITH DIFFERENTIAL/PLATELET
Abs Immature Granulocytes: 0.01 K/uL (ref 0.00–0.07)
Basophils Absolute: 0 K/uL (ref 0.0–0.1)
Basophils Relative: 0 %
Eosinophils Absolute: 0 K/uL (ref 0.0–0.5)
Eosinophils Relative: 0 %
HCT: 43.8 % (ref 36.0–46.0)
Hemoglobin: 14 g/dL (ref 12.0–15.0)
Immature Granulocytes: 0 %
Lymphocytes Relative: 27 %
Lymphs Abs: 1.3 K/uL (ref 0.7–4.0)
MCH: 28.2 pg (ref 26.0–34.0)
MCHC: 32 g/dL (ref 30.0–36.0)
MCV: 88.3 fL (ref 80.0–100.0)
Monocytes Absolute: 0.3 K/uL (ref 0.1–1.0)
Monocytes Relative: 7 %
Neutro Abs: 3.1 K/uL (ref 1.7–7.7)
Neutrophils Relative %: 66 %
Platelets: 188 K/uL (ref 150–400)
RBC: 4.96 MIL/uL (ref 3.87–5.11)
RDW: 14.6 % (ref 11.5–15.5)
WBC: 4.8 K/uL (ref 4.0–10.5)
nRBC: 0 % (ref 0.0–0.2)

## 2024-03-18 LAB — URINALYSIS, ROUTINE W REFLEX MICROSCOPIC
Bacteria, UA: NONE SEEN
Bilirubin Urine: NEGATIVE
Glucose, UA: >=500 mg/dL — AB
Ketones, ur: 20 mg/dL — AB
Leukocytes,Ua: NEGATIVE
Nitrite: NEGATIVE
Protein, ur: 100 mg/dL — AB
Specific Gravity, Urine: 1.02 (ref 1.005–1.030)
pH: 5 (ref 5.0–8.0)

## 2024-03-18 LAB — I-STAT CHEM 8, ED
BUN: 20 mg/dL (ref 8–23)
Calcium, Ion: 1.13 mmol/L — ABNORMAL LOW (ref 1.15–1.40)
Chloride: 109 mmol/L (ref 98–111)
Creatinine, Ser: 1.2 mg/dL — ABNORMAL HIGH (ref 0.44–1.00)
Glucose, Bld: 144 mg/dL — ABNORMAL HIGH (ref 70–99)
HCT: 43 % (ref 36.0–46.0)
Hemoglobin: 14.6 g/dL (ref 12.0–15.0)
Potassium: 3.6 mmol/L (ref 3.5–5.1)
Sodium: 143 mmol/L (ref 135–145)
TCO2: 23 mmol/L (ref 22–32)

## 2024-03-18 LAB — MAGNESIUM: Magnesium: 1.7 mg/dL (ref 1.7–2.4)

## 2024-03-18 LAB — TROPONIN I (HIGH SENSITIVITY)
Troponin I (High Sensitivity): 48 ng/L — ABNORMAL HIGH (ref <18)
Troponin I (High Sensitivity): 41 ng/L — ABNORMAL HIGH (ref <18)

## 2024-03-18 LAB — LIPASE, BLOOD: Lipase: 28 U/L (ref 11–51)

## 2024-03-18 LAB — PROTIME-INR
INR: 1 (ref 0.8–1.2)
Prothrombin Time: 13.3 s (ref 11.4–15.2)

## 2024-03-18 MED ORDER — MIDAZOLAM HCL (PF) 2 MG/2ML IJ SOLN
1.0000 mg | Freq: Once | INTRAMUSCULAR | Status: DC
  Filled 2024-03-18: qty 2

## 2024-03-18 MED ORDER — MAGNESIUM SULFATE IN D5W 1-5 GM/100ML-% IV SOLN
1.0000 g | Freq: Once | INTRAVENOUS | Status: AC
  Administered 2024-03-18: 1 g via INTRAVENOUS
  Filled 2024-03-18: qty 100

## 2024-03-18 MED ORDER — FENTANYL CITRATE (PF) 50 MCG/ML IJ SOSY
12.5000 ug | PREFILLED_SYRINGE | Freq: Once | INTRAMUSCULAR | Status: AC
Start: 2024-03-18 — End: 2024-03-18
  Administered 2024-03-18: 12.5 ug via INTRAVENOUS
  Filled 2024-03-18: qty 1

## 2024-03-18 MED ORDER — MIDAZOLAM HCL (PF) 2 MG/2ML IJ SOLN
1.0000 mg | Freq: Once | INTRAMUSCULAR | Status: AC
  Administered 2024-03-18: 1 mg via INTRAVENOUS
  Filled 2024-03-18: qty 2

## 2024-03-18 MED ORDER — DIPHENHYDRAMINE HCL 50 MG/ML IJ SOLN
25.0000 mg | Freq: Once | INTRAMUSCULAR | Status: AC
  Administered 2024-03-18: 25 mg via INTRAVENOUS
  Filled 2024-03-18: qty 1

## 2024-03-18 MED ORDER — ONDANSETRON HCL 4 MG/2ML IJ SOLN
4.0000 mg | Freq: Once | INTRAMUSCULAR | Status: AC
  Administered 2024-03-18: 4 mg via INTRAVENOUS
  Filled 2024-03-18: qty 2

## 2024-03-18 MED ORDER — ENOXAPARIN SODIUM 40 MG/0.4ML IJ SOSY: 40.0000 mg | PREFILLED_SYRINGE | INTRAMUSCULAR | Status: DC

## 2024-03-18 MED ORDER — DROPERIDOL 2.5 MG/ML IJ SOLN
1.2500 mg | Freq: Once | INTRAMUSCULAR | Status: AC
  Administered 2024-03-18: 1.25 mg via INTRAVENOUS
  Filled 2024-03-18: qty 2

## 2024-03-18 MED ORDER — LACTATED RINGERS IV SOLN: INTRAVENOUS | Status: DC

## 2024-03-18 MED ORDER — PROCHLORPERAZINE EDISYLATE 10 MG/2ML IJ SOLN
10.0000 mg | Freq: Once | INTRAMUSCULAR | Status: AC
  Administered 2024-03-18: 10 mg via INTRAVENOUS
  Filled 2024-03-18: qty 2

## 2024-03-18 MED ORDER — METOCLOPRAMIDE HCL 5 MG/ML IJ SOLN
10.0000 mg | Freq: Once | INTRAMUSCULAR | Status: AC
  Administered 2024-03-18: 10 mg via INTRAVENOUS
  Filled 2024-03-18: qty 2

## 2024-03-18 MED ORDER — SODIUM CHLORIDE 0.9 % IV SOLN
2.0000 g | Freq: Once | INTRAVENOUS | Status: AC
  Administered 2024-03-18: 2 g via INTRAVENOUS
  Filled 2024-03-18: qty 20

## 2024-03-18 MED ORDER — VALPROATE SODIUM 100 MG/ML IV SOLN
1000.0000 mg | Freq: Once | INTRAVENOUS | Status: AC
  Administered 2024-03-18: 1000 mg via INTRAVENOUS
  Filled 2024-03-18: qty 10

## 2024-03-18 MED ORDER — MIDAZOLAM HCL (PF) 2 MG/2ML IJ SOLN
2.0000 mg | Freq: Once | INTRAMUSCULAR | Status: AC
  Administered 2024-03-18: 2 mg via INTRAVENOUS
  Filled 2024-03-18: qty 2

## 2024-03-18 MED ORDER — IOHEXOL 350 MG/ML SOLN
75.0000 mL | Freq: Once | INTRAVENOUS | Status: AC | PRN
  Administered 2024-03-18: 75 mL via INTRAVENOUS

## 2024-03-18 MED ORDER — POLYETHYLENE GLYCOL 3350 17 G PO PACK
17.0000 g | PACK | Freq: Every day | ORAL | Status: DC | PRN
  Filled 2024-03-18: qty 1

## 2024-03-18 NOTE — ED Provider Notes (Cosign Needed Addendum)
 Physical Exam  BP (!) 175/148   Pulse (!) 102   Temp 98.5 F (36.9 C) (Oral)   Resp (!) 23   Ht 5' 3 (1.6 m)   Wt 80.7 kg   SpO2 92%   BMI 31.53 kg/m   Physical Exam Vitals and nursing note reviewed.  Constitutional:      Appearance: She is not toxic-appearing.     Comments: Dry heaving, not-toxic appearing  Eyes:     Extraocular Movements: Extraocular movements intact.     Pupils: Pupils are equal, round, and reactive to light.  Pulmonary:     Effort: Pulmonary effort is normal. No respiratory distress.  Abdominal:     Palpations: Abdomen is soft.     Tenderness: There is no abdominal tenderness.  Skin:    General: Skin is warm and dry.  Neurological:     Mental Status: She is alert and oriented to person, place, and time.     Motor: Weakness (left sided at baseline) present.     Procedures  Procedures  ED Course / MDM   Clinical Course as of 03/18/24 2350  Tue Mar 18, 2024  1534 Spoke with neurology [RR]  [phone] 73 yo female here with complaint of weakness, headache, abdominal pain.  Pt has chronic confusion from prior stroke, but was complaining of abdominal pain and nausea earlier. Had limited CT head earlier due to inability to lie still.  Per neurologist reocmmendation patient received a litany of IV medications for possible complex migraine, including IV mag, benadryl , compazine , decapon, and reglan.  After completing these medications, there was remarkably NO sedative effect, patient remained wide awake and restless, trying to get out of bed, repeatedly telling me she was trying to get comfortable but denying chest pain.  I've ordered IV droperidol for sedation, we are still attempting to complete CT imaging of the abdomen pelvis [MT]  1916 We were able to obtain CT imaging.  No emergent findings on neuroimaging, questionable inflammatory stranding of the kidney on CT abdomen.  We are working on obtaining a urinalysis.  Unfortunately, we continue to struggle with  any type of sedation on the patient, who remains restless in the bed and difficult to redirect [MT]  2114 No obvious sign of infection per the patient's UA, no leukocytosis or fever per rectal temperature to warrant further infectious workup, despite CT imaging findings with nonspecific inflammatory issues around the kidney.  Patient will be admitted at this point for encephalopathy, with supplemental history provided by family member reporting acute change over the past 3 nights, waxing and waning mental status. [MT]    Clinical Course User Index [MT] Trifan, Donnice PARAS, MD [RR] Bernis Ernst, PA-C   Medical Decision Making Amount and/or Complexity of Data Reviewed Labs: ordered. Radiology: ordered.  Risk Prescription drug management. Decision regarding hospitalization.   Accepted handoff at shift change from [facility]. Please see prior provider note for more detail.   Briefly: Patient is 73 y.o. F presenting to the ER today for evaluation of post CVA headache. The patient reports that the headache has been going on for three days, however the previous note mentions starting this AM. She reports that she has had headaches since her stroke.  Has not followed up with neurology but has a scheduled appointment in January.  She reports that she is been compliant with her Eliquis  and blood pressure medication and has not missed any doses.  She reports that she has left-sided weakness that is  at her baseline.  She reports the nausea and vomiting worsened this morning.  Denies any belly pain, diarrhea, constipation.  She denies any eye pain or vision changes.  Reports that her headache is above her left eye.  She denies any chest pain, shortness of breath, or any cough or cold symptoms. Plan is to follow up after medications. CT head ordered.   I evaluated the patient at bedside.  She does have some chronic left-sided deficits.  Patient reports that he is at her at her baseline.  She has no scalp  tenderness to palpation.  She denies any vision changes.  Denies any eye pain specifically.  She reports that the pain is above her eyebrow on the left side.  Denies any neck pain or recent fevers.  She received the medications around 45 minutes prior but is still having symptoms and is dry heaving in the room.  Her abdomen in soft and non tender. She denies abdominal pain. Will consult neurology.  Consulted neurology and spoke with Dr. Michaela.  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  In addition to the Compazine  and Benadryl  she received, he recommends giving 10 mg of Reglan, 1 g of Depakote, and 1 g of magnesium and obtaining a Noncon CT of the head.  Patient was unable to tolerate lying down because of her nausea vomiting.  Was not complaining about abdominal pain until getting to the CT scan.  Will obtain CT abdomen pelvis in addition to CT of the head.  Patient was having increasing difficulty for the patient to obtain the scans given her persistent restlessness despite the Depakote, Reglan, Compazine , Benadryl , and magnesium.  She is reporting she is trying to get comfortable.  She still denies any abdominal pain or chest pain.  She just reports my stomach is just upset.  She has not had any dry heaving or emesis since the medications however.  My attending assessed the patient at bedside and ordered some droperidol.  Patient is still having restlessness, did order her some Versed. She does report that her headache subsided.  Finally, was able to obtain CT imaging.  CT imaging does not show any new emergent findings of the brain.  CT abdomen pelvis shows some questional pyelonephritis.  Patient was catheterized at bedside and did show some clear urine.  Rectal temperature was also obtained and 98.1 Fahrenheit. I have ordered the patient some Rocephin  while waiting for the urinalysis.  I independently reviewed and interpreted the patient's labs.  CBC without  leukocytosis or anemia.  BMP shows a bicarb of 20, glucose of 149 with creatinine of 1.15.  No other electrolyte abnormalities.  Initial opponent 48 with a repeat of 41.  Does appear patient troponins been in the 20s before.  Urinalysis colorless urine with greater than 5 to glucose present.  Small amount of hemoglobin present.  20 ketones and 100 protein but there is no leukocytes, white blood cells, or bacteria present.  Urinalysis returns without signs of infection. I have ordered a culture on the urine. Have also ordered lactic and blood cultures that are pending.   Was able to speak with the son in room and reports that she has been having difficulty and restlessness for the past 3 nights as well and has been more confused than her baseline after the stroke.  Given this information, will admit for encephalopathy.  I discussed this case with my attending physician who cosigned this note including patient's presenting symptoms,  physical exam, and planned diagnostics and interventions. Attending physician stated agreement with plan or made changes to plan which were implemented.   Attending physician assessed patient at bedside.   Results for orders placed or performed during the hospital encounter of 03/18/24  Basic metabolic panel   Collection Time: 03/18/24  2:46 PM  Result Value Ref Range   Sodium 140 135 - 145 mmol/L   Potassium 3.8 3.5 - 5.1 mmol/L   Chloride 106 98 - 111 mmol/L   CO2 20 (L) 22 - 32 mmol/L   Glucose, Bld 149 (H) 70 - 99 mg/dL   BUN 18 8 - 23 mg/dL   Creatinine, Ser 8.84 (H) 0.44 - 1.00 mg/dL   Calcium  9.4 8.9 - 10.3 mg/dL   GFR, Estimated 50 (L) >60 mL/min   Anion gap 14 5 - 15  CBC with Differential/Platelet   Collection Time: 03/18/24  2:46 PM  Result Value Ref Range   WBC 4.8 4.0 - 10.5 K/uL   RBC 4.96 3.87 - 5.11 MIL/uL   Hemoglobin 14.0 12.0 - 15.0 g/dL   HCT 56.1 63.9 - 53.9 %   MCV 88.3 80.0 - 100.0 fL   MCH 28.2 26.0 - 34.0 pg   MCHC 32.0 30.0 - 36.0  g/dL   RDW 85.3 88.4 - 84.4 %   Platelets 188 150 - 400 K/uL   nRBC 0.0 0.0 - 0.2 %   Neutrophils Relative % 66 %   Neutro Abs 3.1 1.7 - 7.7 K/uL   Lymphocytes Relative 27 %   Lymphs Abs 1.3 0.7 - 4.0 K/uL   Monocytes Relative 7 %   Monocytes Absolute 0.3 0.1 - 1.0 K/uL   Eosinophils Relative 0 %   Eosinophils Absolute 0.0 0.0 - 0.5 K/uL   Basophils Relative 0 %   Basophils Absolute 0.0 0.0 - 0.1 K/uL   Immature Granulocytes 0 %   Abs Immature Granulocytes 0.01 0.00 - 0.07 K/uL  Protime-INR   Collection Time: 03/18/24  2:46 PM  Result Value Ref Range   Prothrombin Time 13.3 11.4 - 15.2 seconds   INR 1.0 0.8 - 1.2  Magnesium   Collection Time: 03/18/24  2:46 PM  Result Value Ref Range   Magnesium 1.7 1.7 - 2.4 mg/dL  Lipase, blood   Collection Time: 03/18/24  2:46 PM  Result Value Ref Range   Lipase 28 11 - 51 U/L  Troponin I (High Sensitivity)   Collection Time: 03/18/24  2:46 PM  Result Value Ref Range   Troponin I (High Sensitivity) 48 (H) <18 ng/L  I-Stat Chem 8, ED   Collection Time: 03/18/24  2:53 PM  Result Value Ref Range   Sodium 143 135 - 145 mmol/L   Potassium 3.6 3.5 - 5.1 mmol/L   Chloride 109 98 - 111 mmol/L   BUN 20 8 - 23 mg/dL   Creatinine, Ser 8.79 (H) 0.44 - 1.00 mg/dL   Glucose, Bld 855 (H) 70 - 99 mg/dL   Calcium , Ion 1.13 (L) 1.15 - 1.40 mmol/L   TCO2 23 22 - 32 mmol/L   Hemoglobin 14.6 12.0 - 15.0 g/dL   HCT 56.9 63.9 - 53.9 %  Troponin I (High Sensitivity)   Collection Time: 03/18/24  5:52 PM  Result Value Ref Range   Troponin I (High Sensitivity) 41 (H) <18 ng/L  Urinalysis, Routine w reflex microscopic -Urine, Catheterized   Collection Time: 03/18/24  8:12 PM  Result Value Ref Range   Color, Urine COLORLESS (A)  YELLOW   APPearance CLEAR CLEAR   Specific Gravity, Urine 1.020 1.005 - 1.030   pH 5.0 5.0 - 8.0   Glucose, UA >=500 (A) NEGATIVE mg/dL   Hgb urine dipstick SMALL (A) NEGATIVE   Bilirubin Urine NEGATIVE NEGATIVE   Ketones, ur  20 (A) NEGATIVE mg/dL   Protein, ur 899 (A) NEGATIVE mg/dL   Nitrite NEGATIVE NEGATIVE   Leukocytes,Ua NEGATIVE NEGATIVE   RBC / HPF 0-5 0 - 5 RBC/hpf   WBC, UA 0-5 0 - 5 WBC/hpf   Bacteria, UA NONE SEEN NONE SEEN   Squamous Epithelial / HPF 0-5 0 - 5 /HPF   Mucus PRESENT   I-Stat CG4 Lactic Acid   Collection Time: 03/18/24  9:48 PM  Result Value Ref Range   Lactic Acid, Venous 3.9 (HH) 0.5 - 1.9 mmol/L   Comment NOTIFIED PHYSICIAN    DG CHEST PORT 1 VIEW Result Date: 03/18/2024 EXAM: 1 VIEW(S) XRAY OF THE CHEST 03/18/2024 11:28:44 PM COMPARISON: 02/18/2024 CLINICAL HISTORY: Chest pain. FINDINGS: LUNGS AND PLEURA: Mild eventration of the hemidiaphragm. No focal pulmonary opacity. No pulmonary edema. No pleural effusion. No pneumothorax. HEART AND MEDIASTINUM: No acute abnormality of the cardiac and mediastinal silhouettes. BONES AND SOFT TISSUES: Postsurgical changes involving the right shoulder. No acute osseous abnormality. IMPRESSION: 1. No acute cardiopulmonary findings. Electronically signed by: Pinkie Pebbles MD 03/18/2024 11:34 PM EST RP Workstation: [id number]   CT ABDOMEN PELVIS W CONTRAST Result Date: 03/18/2024 CLINICAL DATA:  Acute abdominal pain EXAM: CT ABDOMEN AND PELVIS WITH CONTRAST TECHNIQUE: Multidetector CT imaging of the abdomen and pelvis was performed using the standard protocol following bolus administration of intravenous contrast. RADIATION DOSE REDUCTION: This exam was performed according to the departmental dose-optimization program which includes automated exposure control, adjustment of the mA and/or kV according to patient size and/or use of iterative reconstruction technique. CONTRAST:  75mL OMNIPAQUE  IOHEXOL  350 MG/ML SOLN COMPARISON:  02/18/2024 FINDINGS: Lower chest: No acute pleural or parenchymal lung disease. Hepatobiliary: No focal liver abnormality is seen. Status post cholecystectomy. No biliary dilatation. Pancreas: Unremarkable. No pancreatic ductal  dilatation or surrounding inflammatory changes. Spleen: Normal in size without focal abnormality. Adrenals/Urinary Tract: Heterogeneous areas of decreased cortical enhancement are seen within the dorsal aspect of the left kidney, which could reflect acute pyelonephritis. Please correlate with urinalysis. There are other scattered areas of cortical scarring within the bilateral kidneys. Small simple appearing renal cortical cysts do not require specific imaging follow-up. The adrenals and bladder are unremarkable. Stomach/Bowel: No bowel obstruction or ileus. No bowel wall thickening or inflammatory change. Small hiatal hernia. Vascular/Lymphatic: Aortic atherosclerosis. No enlarged abdominal or pelvic lymph nodes. Reproductive: Uterus and bilateral adnexa are unremarkable. Other: No free fluid or free intraperitoneal gas. No abdominal wall hernia. Musculoskeletal: No acute or destructive bony abnormalities. Severe spondylosis at the L3-4 level. Reconstructed images demonstrate no additional findings. IMPRESSION: 1. Heterogeneous areas of decreased cortical enhancement within the left kidney, suspicious for pyelonephritis. Please correlate with urinalysis. 2. Otherwise no acute intra-abdominal or intrapelvic process. 3. Small hiatal hernia. 4.  Aortic Atherosclerosis (ICD10-I70.0). Electronically Signed   By: Ozell Daring M.D.   On: 03/18/2024 18:58   CT Head Wo Contrast Result Date: 03/18/2024 CLINICAL DATA:  Altered level of consciousness EXAM: CT HEAD WITHOUT CONTRAST TECHNIQUE: Contiguous axial images were obtained from the base of the skull through the vertex without intravenous contrast. RADIATION DOSE REDUCTION: This exam was performed according to the departmental dose-optimization program which includes automated exposure control, adjustment  of the mA and/or kV according to patient size and/or use of iterative reconstruction technique. COMPARISON:  03/18/2024 at 4 p.m. FINDINGS: Brain: Stable chronic  right PCA territory infarct with developing encephalomalacia. No evidence of acute infarct or hemorrhage. Lateral ventricles and midline structures are stable. No acute extra-axial fluid collections. No mass effect. Vascular: No hyperdense vessel or unexpected calcification. Skull: Normal. Negative for fracture or focal lesion. Sinuses/Orbits: No acute finding. Other: None. IMPRESSION: 1. Stable sequela of chronic right PCA territory infarct. 2. No acute intracranial process. Electronically Signed   By: Ozell Daring M.D.   On: 03/18/2024 18:54   CT Head Wo Contrast Result Date: 03/18/2024 CLINICAL DATA:  Altered level of consciousness, headache EXAM: CT HEAD WITHOUT CONTRAST TECHNIQUE: Contiguous axial images were obtained from the base of the skull through the vertex without intravenous contrast. RADIATION DOSE REDUCTION: This exam was performed according to the departmental dose-optimization program which includes automated exposure control, adjustment of the mA and/or kV according to patient size and/or use of iterative reconstruction technique. COMPARISON:  02/18/2024 FINDINGS: Examination is significantly limited by patient motion throughout the exam, despite multiple repeat imaging attempts. Brain: Continued evolution of the prior right PCA territory infarct. No evidence of hemorrhagic transformation. Stable chronic small-vessel ischemic changes within the right thalamus and periventricular white matter. No evidence of acute infarct or hemorrhage. Lateral ventricles and remaining midline structures are stable. No acute extra-axial fluid collections. No mass effect. Vascular: No hyperdense vessel or unexpected calcification. Skull: Normal. Negative for fracture or focal lesion. Sinuses/Orbits: No acute finding. Other: None. IMPRESSION: 1. Limited study due to patient motion throughout the exam. 2. Continued evolution of the chronic right PCA territory infarct noted previously. No evidence of hemorrhagic  transformation. 3. Otherwise no acute intracranial process. Electronically Signed   By: Ozell Daring M.D.   On: 03/18/2024 16:14     Bernis Ernst, PA-C 03/19/24 0000    Bernis Ernst, PA-C 03/19/24 0001    Cottie Donnice PARAS, MD 03/21/24 [phone]

## 2024-03-18 NOTE — Hospital Course (Addendum)
 Euglycemic DKA and Lactic Acidosis  The patient was admitted to the hospital for headache and altered mental status. The patient was found to be in Euglycemic DKA and given insulin  and fluids via Endotool for several days until her anion gap closed. Anion gap was also elevated secondary to lactic acidosis. The patient was not septic, and there were no clear signs of infarction CTA abdomen pelvis was negative for infarct.  Encephalopathy, Acute R MCA Ischemic Stroke, and History of R PCA Stroke The patient has a history of recent ischemic stroke (01/2024). During this admission, heparin  was started for management of an acute PE, and was held after concerned arose for a petechial intracranial hemorrhage. Neurology was consulted. EEG was obtained to evaluate for seizure activity and showed no electrographic seizures. Long term monitoring revealed right posterior sharp waves and generalized slowing but no definite epileptiform activity. She remained clinically stable w/o new deficits and was continued on Keppra  500 mg twice daily for seizure prophylaxis. Patient did not pass the swallow evaluation and was determined unsafe for oral intake. A Dobbhoff feeding tube was placed, and she was maintained on tube feeds until able to receive adequate nutrition. 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      Acute Pulmonary Embolism CTA chest on 03/20/24 showed a right lower lobe subsegmental PE. She had been on Apaxiban prior to admission, although since anticoagulation was held for several days, we do not consider this to be an anticoagulation failure because she was only taking half medication dose during this time. She was started on Heparin  infusion, which was briefly held dur to concern of petechial hemorrhage then safely resumed. LE doppler showed no DVT. 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 A TCD bubble study was considered but  deferred given low suspicion for paradoxical embolism. Venous ultrasound of the lower extremities showed no evidence of deep vein thrombosis.   Acute Kidney Injury The patient developed an acute kidney injury during hospitalization, likely multifactorial from volume depletion and repeated contrast exposure from imaging studies. Renal function and electrolytes have been monitored closely, with gradual improvement observed.    Hypertension The patient has chronic hypertension with fluctuating BPs during admission. Given her chronic right ICA occlusion, permissive hypertension was allowed to avoid hypotension to preserve cerebral perfusion. Long-term BP goal remains normotension, with ongoing monitoring and adjustment of therapy as needed.   Type 2 Diabetes Mellitus Chronic condition. Now stable status post earlier euglycemic DKA and lactic acidosis. Patient exhibited one hypoglycemic episode on 03/28/24, that was managed with D50, then blood glucose subsequently improved. Glucose levels monitored throughout admission with appropriate adjustments to maintain control.    Hyperthyroidism Chronic and stable throughout hospital admission. The patient continued on her home regimen of methimazole  and cholestyramine  with good tolerance and no evidence of thyrotoxicosis.   Atrial fibrillation Chronic, rate-controlled on Metoprolol . She was previously managed on Eloquis as an outpatient. Anticoagulation was changed to IV heparin  during admission due to the development of a PE. She has remained stable on the heparin  infusion, with plans to transition back to Eloquis at discharge.   Chronic Kidney Disease Baseline CKD with superimposed AKI, likely contrast-associated. Creatinine has down-trended toward baseline, and prior anion gap metabolic disturbance has resolved.

## 2024-03-18 NOTE — Progress Notes (Deleted)
 Patient name: Megan Fischer Date of birth: 08-10-1950 Date of visit: 03/18/24  Type of visit: Acute Office Visit  Subjective   Chief concern: No chief complaint on file.   Megan Fischer is a 73 y.o. 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    Of note, the patient recently went to ED on 02/18/24 for headache. EDP discussed with on-call neurologist who deemed no further workup as there were no new neurologic deficits at that visit and the patient improved with Tylenol  and Compazine .   Patient Active Problem List   Diagnosis Date Noted   Left shoulder pain 02/18/2024   Chronic kidney disease 01/31/2024   Acute ischemic right PCA stroke (HCC) 01/24/2024   Stroke (HCC) 01/21/2024   Acute cerebrovascular accident (CVA) due to occlusion of left posterior cerebral artery (HCC) 01/21/2024   Cellulitis of left finger 09/19/2023   Chest pain 09/19/2023   Leg pain 03/02/2023   Acute respiratory failure with hypoxia (HCC) 04/11/2021   Acute on chronic diastolic CHF (congestive heart failure) (HCC) 04/11/2021   Hypercholesterolemia 09/17/2018   Iron deficiency anemia 06/24/2017   Anemia of chronic disease 07/20/2016   Healthcare maintenance 06/09/2013   Multinodular goiter 01/20/2013   Chronic disease anemia 03/29/2011   Obesities, morbid (HCC)    Hypertension    Dyslipidemia    DM (diabetes mellitus) type II controlled with renal manifestation (HCC)    Atrial fibrillation (HCC) 08/04/2010     Past Surgical History:  Procedure Laterality Date   CHOLECYSTECTOMY     REPLACEMENT TOTAL KNEE BILATERAL     SHOULDER SURGERY     RIGHT SHOULDER   TUBAL LIGATION      ROS negative unless otherwise indicated in the HPI or Assessment and Plan.  Current  Outpatient Medications  Medication Instructions   acetaminophen  (TYLENOL ) 325-650 mg, Oral, Every 4 hours PRN   amLODipine  (NORVASC ) 2.5 mg, Oral, Daily   apixaban  (ELIQUIS ) 5 mg, Oral, 2 times daily   aspirin  EC 81 mg, Oral, Daily, Swallow whole.   benazepril  (LOTENSIN ) 20 mg, Oral, Daily   Blood Glucose Monitoring Suppl DEVI 1 each, Does not apply, As directed, Dispense based on patient and insurance preference. Use up to four times daily as directed. (FOR ICD-10 E10.9, E11.9).   CALCIUM  PO 1 tablet, Oral, 2 times daily,     camphor-menthol  (SARNA) lotion Topical, As needed   celecoxib (CELEBREX) 200 mg, Oral, Daily PRN   diclofenac  Sodium (VOLTAREN ) 2 g, Topical, 4 times daily, To both knees   ezetimibe  (ZETIA ) 10 mg, Oral, Daily   famotidine  (PEPCID ) 20 mg, Oral, Daily   ferrous sulfate  325 mg, Oral, 2 times daily   gabapentin  (NEURONTIN ) 100 mg, Oral, 3 times daily   glimepiride  (AMARYL ) 1 mg, Oral, Daily with breakfast   Glucose Blood (BLOOD GLUCOSE TEST STRIPS) STRP 1 each, Does not apply, As directed, Dispense based on patient and insurance preference. Use up to four times daily as directed. (FOR ICD-10 E10.9, E11.9).   Lancet Device MISC 1 each, Does not apply, As directed, Dispense based on patient and insurance preference. Use up to four times daily as directed. (FOR ICD-10 E10.9, E11.9).   Lancets MISC 1 each, Does not apply, As directed, Dispense based on patient and insurance preference. Use up  to four times daily as directed. (FOR ICD-10 E10.9, E11.9).   lidocaine (LIDODERM) 5 % 2 patches, Transdermal, Every 24 hours, Remove & Discard patch within 12 hours or as directed by MD   methimazole  (TAPAZOLE ) 5 mg, Oral, Daily   methocarbamol  (ROBAXIN ) 500 mg, Oral, Every 6 hours PRN   metoprolol  succinate (TOPROL -XL) 50 MG 24 hr tablet TAKE 1 TABLET BY MOUTH DAILY  WITH OR IMMEDIATELY FOLLOWING A  MEAL   polyethylene glycol powder (GLYCOLAX /MIRALAX ) 17 GM/SCOOP powder Dissolve 1  capful (17g) in 4-8 ounces of liquid and take by mouth daily.   rosuvastatin  (CRESTOR ) 20 mg, Oral, Daily after supper   Semaglutide , 2 MG/DOSE, (OZEMPIC , 2 MG/DOSE,) 8 MG/3ML SOPN INJECT SUBCUTANEOUSLY 2 MG EVERY WEEK   senna-docusate (SENOKOT-S) 8.6-50 MG tablet 2 tablets, Oral, Daily at bedtime   simethicone (MYLICON) 160 mg, Oral, 4 times daily    Social History   Tobacco Use   Smoking status: Never   Smokeless tobacco: Never  Vaping Use   Vaping status: Never Used  Substance Use Topics   Alcohol use: No   Drug use: No      Objective  There were no vitals filed for this visit.There is no height or weight on file to calculate BMI.   Physical Exam: Constitutional: well-appearing, well-nourished; *** weight; no acute distress HENT: normocephalic atraumatic, mucous membranes moist Eyes: conjunctiva non-erythematous Cardiovascular: regular rate and rhythm, no m/r/g Pulmonary/Chest: normal work of breathing on room air, lungs CTAB Abdominal: soft, non-tender, non-distended MSK: normal bulk and tone Neurological: alert & oriented x 3, no focal deficit Skin: warm and dry Extremities: BLE without edema or erythema. Psych: normal mood and behavior  {Labs (Optional):23779}    Assessment & Plan  There are no diagnoses linked to this encounter.  No follow-ups on file.  Headache -already taking metoprolol  succinate 50mg  for HF but this isn't optimal dose for migraine prevention     Left shoulder pain Last X-ray on 02/07/24 showed narrowing of subacromial interval likely in setting of rotator cuff pathology; joint space narrowing of AC joint and glenohumeral joint; osteophyte formation on humeral head. At last visit - Dr. Isobel continued voltaren , celecoxib 200mg  prn for pain, tramadol  50mg  q6h prn for 10 days    Patient case discussed with Dr. {imcattendings:[id number]}{NAMES:[id number]::, who also saw and evaluated the patient.,.}  Megan Cheadle, MD [facility]   PGY-1 03/18/2024, 10:01 AM

## 2024-03-18 NOTE — H&P (Incomplete)
 Date: 03/18/2024               Patient Name:  Megan Fischer MRN: [id number]  DOB: Sep 25, 1950 Age / Sex: 73 y.o., female   PCP: Bernadine Manos, MD         Medical Service: Internal Medicine Teaching Service         Attending Physician: Dr. Reyes Fenton      First Contact: Dr. CHARM Penne Mori, DO    Second Contact: Dr. Damien Lease, DO         Pager Information: First Contact Pager: [phone]   Second Contact Pager: [phone]   SUBJECTIVE   Chief Complaint: Headache and Weakness  History of Present Illness: Megan Fischer is a 73 y.o. female with PMH of right PCA stroke (01/2024), atrial fibrillation, hypertension, type 2 diabetes mellitus, hyperlipidemia, and multinodular goiter who presented with headache and generalized weakness.  She reports that she came in by ambulance today because her son was concerned about her worsening headache. The headache is localized to the left eye, sharp in quality, non-radiating, and has been present for the past 3-4 days. She denies dizziness, tinnitus, chest pain, or palpations.  She also reports increasing wheelchair use over the past 2-3 weeks due to difficulty ambulating and unsteadiness since her stroke.   Additionally, she endorses new-onset abdominal pain, described as diffuse but most prominent in the epigastric region, rated as a 12/10, and associated with nausea and vomiting. She also endorses paresthesias in her fingertips. She denies constipation, diarrhea, recent trauma, or falls. She reports this abdominal pain feels different from any prior pain.   ED Course: Labs significant for: (all labs ordered are listed, but only abnormal results are displayed)  Abnormal Labs Reviewed  BASIC METABOLIC PANEL WITH GFR - Abnormal; Notable for the following components:      Result Value   CO2 20 (*)    Glucose, Bld 149 (*)    Creatinine, Ser 1.15 (*)    GFR, Estimated 50 (*)    All other components within normal limits  URINALYSIS,  ROUTINE W REFLEX MICROSCOPIC - Abnormal; Notable for the following components:   Color, Urine COLORLESS (*)    Glucose, UA >=500 (*)    Hgb urine dipstick SMALL (*)    Ketones, ur 20 (*)    Protein, ur 100 (*)    All other components within normal limits  I-STAT CHEM 8, ED - Abnormal; Notable for the following components:   Creatinine, Ser 1.20 (*)    Glucose, Bld 144 (*)    Calcium , Ion 1.13 (*)    All other components within normal limits  I-STAT CG4 LACTIC ACID, ED - Abnormal; Notable for the following components:   Lactic Acid, Venous 3.9 (*)    All other components within normal limits  TROPONIN I (HIGH SENSITIVITY) - Abnormal; Notable for the following components:   Troponin I (High Sensitivity) 48 (*)    All other components within normal limits  TROPONIN I (HIGH SENSITIVITY) - Abnormal; Notable for the following components:   Troponin I (High Sensitivity) 41 (*)    All other components within normal limits   Imaging  Received  Consulted   Meds:  Patient reported: ***  Current Meds  Medication Sig   acetaminophen  (TYLENOL ) 325 MG tablet Take 1-2 tablets (325-650 mg total) by mouth every 4 (four) hours as needed for mild pain (pain score 1-3).   amLODipine  (NORVASC ) 2.5 MG tablet Take 1 tablet (2.5 mg  total) by mouth daily.   apixaban  (ELIQUIS ) 5 MG TABS tablet Take 1 tablet (5 mg total) by mouth 2 (two) times daily.   aspirin  EC 81 MG tablet Take 1 tablet (81 mg total) by mouth daily. Swallow whole.   benazepril  (LOTENSIN ) 20 MG tablet TAKE 1 TABLET BY MOUTH DAILY   CALCIUM  PO Take 1 tablet by mouth 2 (two) times daily.   camphor-menthol  (SARNA) lotion Apply topically as needed for itching.   celecoxib (CELEBREX) 200 MG capsule Take 1 capsule (200 mg total) by mouth daily as needed for moderate pain (pain score 4-6).   diclofenac  Sodium (VOLTAREN ) 1 % GEL Apply 2 g topically 4 (four) times daily. To both knees   DULoxetine  (CYMBALTA ) 60 MG capsule Take 60 mg by mouth  daily.   ezetimibe  (ZETIA ) 10 MG tablet Take 1 tablet (10 mg total) by mouth daily.   famotidine  (PEPCID ) 20 MG tablet Take 1 tablet (20 mg total) by mouth daily.   gabapentin  (NEURONTIN ) 100 MG capsule Take 1 capsule (100 mg total) by mouth 3 (three) times daily. (Patient taking differently: Take 300 mg by mouth See admin instructions. 1-2 times daily only)   glimepiride  (AMARYL ) 1 MG tablet Take 1 tablet (1 mg total) by mouth daily with breakfast.   JARDIANCE  25 MG TABS tablet Take 25 mg by mouth daily.   lidocaine (LIDODERM) 5 % Place 2 patches onto the skin daily. Remove & Discard patch within 12 hours or as directed by MD   methimazole  (TAPAZOLE ) 5 MG tablet TAKE 1 TABLET BY MOUTH DAILY   methocarbamol  (ROBAXIN ) 500 MG tablet Take 1 tablet (500 mg total) by mouth every 6 (six) hours as needed for muscle spasms.   metoprolol  succinate (TOPROL -XL) 50 MG 24 hr tablet TAKE 1 TABLET BY MOUTH DAILY  WITH OR IMMEDIATELY FOLLOWING A  MEAL   Multiple Vitamins-Minerals (MULTIVITAMIN WITH MINERALS) tablet Take 1 tablet by mouth daily.   rosuvastatin  (CRESTOR ) 20 MG tablet Take 1 tablet (20 mg total) by mouth daily after supper.   Semaglutide , 2 MG/DOSE, (OZEMPIC , 2 MG/DOSE,) 8 MG/3ML SOPN INJECT SUBCUTANEOUSLY 2 MG EVERY WEEK    Past Medical History ***  Past Surgical History Past Surgical History:  Procedure Laterality Date   CHOLECYSTECTOMY     REPLACEMENT TOTAL KNEE BILATERAL     SHOULDER SURGERY     RIGHT SHOULDER   TUBAL LIGATION       Social:  Lives With:  Occupation: Retired Support: Level of Function: Independent ADLs and IDLs. PCP: Bernadine Manos, MD  Substances: -Tobacco: No -Alcohol: No -Recreational Drug: No  Family History:  Family History  Problem Relation Age of Onset   Heart attack Mother    Stroke Mother    Diabetes Mother    Hypertension Brother    Breast cancer Neg Hx      Allergies: Allergies as of 03/18/2024 - Review Complete 03/18/2024  Allergen  Reaction Noted   Aspirin  Nausea Only 11/16/2020   Codeine Other (See Comments) 08/25/2010   Fentanyl  Other (See Comments) 11/12/2020   Oxycodone hcl Other (See Comments) 08/25/2010   Penicillins Other (See Comments) 08/25/2010   Chlorhexidine gluconate Rash 06/04/2015    Review of Systems: A complete ROS was negative except as per HPI.   OBJECTIVE:   Physical Exam: Blood pressure (!) 148/102, pulse 100, temperature 99.6 F (37.6 C), temperature source Oral, resp. rate (!) 22, height 5' 3 (1.6 m), weight 80.7 kg, SpO2 100%.  Constitutional: well-appearing ***  sitting in ***, in no acute distress HENT: normocephalic atraumatic, mucous membranes moist Eyes: conjunctiva non-erythematous Neck: supple Cardiovascular: regular rate and rhythm, no m/r/g Pulmonary/Chest: normal work of breathing on room air, lungs clear to auscultation bilaterally Abdominal: soft, non-tender, non-distended MSK: normal bulk and tone Neurological: alert & oriented x 3, 5/5 strength in bilateral upper and lower extremities, normal gait Skin: warm and dry Psych: ***  Labs: CBC    Component Value Date/Time   WBC 4.8 03/18/2024 1446   RBC 4.96 03/18/2024 1446   HGB 14.6 03/18/2024 1453   HGB 12.7 02/28/2024 1120   HGB 12.0 02/16/2017 0802   HCT 43.0 03/18/2024 1453   HCT 39.5 02/28/2024 1120   HCT 36.8 02/16/2017 0802   PLT 188 03/18/2024 1446   PLT 231 02/28/2024 1120   MCV 88.3 03/18/2024 1446   MCV 91 02/28/2024 1120   MCV 89.3 02/16/2017 0802   MCH 28.2 03/18/2024 1446   MCHC 32.0 03/18/2024 1446   RDW 14.6 03/18/2024 1446   RDW 13.4 02/28/2024 1120   RDW 15.6 (H) 02/16/2017 0802   LYMPHSABS 1.3 03/18/2024 1446   LYMPHSABS 1.4 02/28/2024 1120   LYMPHSABS 1.9 02/16/2017 0802   MONOABS 0.3 03/18/2024 1446   MONOABS 0.2 02/16/2017 0802   EOSABS 0.0 03/18/2024 1446   EOSABS 0.0 02/28/2024 1120   EOSABS 0.2 02/05/2006 0932   BASOSABS 0.0 03/18/2024 1446   BASOSABS 0.0 02/28/2024 1120    BASOSABS 0.0 02/16/2017 0802     CMP     Component Value Date/Time   NA 143 03/18/2024 1453   NA 145 (H) 02/28/2024 1120   NA 143 02/16/2017 0802   K 3.6 03/18/2024 1453   K 3.5 02/16/2017 0802   CL 109 03/18/2024 1453   CO2 20 (L) 03/18/2024 1446   CO2 26 02/16/2017 0802   GLUCOSE 144 (H) 03/18/2024 1453   GLUCOSE 92 02/16/2017 0802   BUN 20 03/18/2024 1453   BUN 22 02/28/2024 1120   BUN 17.2 02/16/2017 0802   CREATININE 1.20 (H) 03/18/2024 1453   CREATININE 1.33 (H) 10/12/2023 0859   CREATININE 0.9 02/16/2017 0802   CALCIUM  9.4 03/18/2024 1446   CALCIUM  9.9 02/16/2017 0802   PROT 8.1 01/25/2024 1303   PROT 7.5 02/16/2017 0802   ALBUMIN 3.7 01/25/2024 1303   ALBUMIN 3.9 02/16/2017 0802   AST 35 01/25/2024 1303   AST 25 08/08/2023 0813   AST 26 02/16/2017 0802   ALT 22 01/25/2024 1303   ALT 18 08/08/2023 0813   ALT 18 02/16/2017 0802   ALKPHOS 103 01/25/2024 1303   ALKPHOS 64 02/16/2017 0802   BILITOT 0.7 01/25/2024 1303   BILITOT 0.5 08/08/2023 0813   BILITOT 0.58 02/16/2017 0802   GFRNONAA 50 (L) 03/18/2024 1446   GFRNONAA 40 (L) 08/08/2023 0813   GFRAA >60 05/17/2018 0807    Imaging: *** CT ABDOMEN PELVIS W CONTRAST Result Date: 03/18/2024 CLINICAL DATA:  Acute abdominal pain EXAM: CT ABDOMEN AND PELVIS WITH CONTRAST TECHNIQUE: Multidetector CT imaging of the abdomen and pelvis was performed using the standard protocol following bolus administration of intravenous contrast. RADIATION DOSE REDUCTION: This exam was performed according to the departmental dose-optimization program which includes automated exposure control, adjustment of the mA and/or kV according to patient size and/or use of iterative reconstruction technique. CONTRAST:  75mL OMNIPAQUE  IOHEXOL  350 MG/ML SOLN COMPARISON:  02/18/2024 FINDINGS: Lower chest: No acute pleural or parenchymal lung disease. Hepatobiliary: No focal liver abnormality is seen. Status post  cholecystectomy. No biliary dilatation.  Pancreas: Unremarkable. No pancreatic ductal dilatation or surrounding inflammatory changes. Spleen: Normal in size without focal abnormality. Adrenals/Urinary Tract: Heterogeneous areas of decreased cortical enhancement are seen within the dorsal aspect of the left kidney, which could reflect acute pyelonephritis. Please correlate with urinalysis. There are other scattered areas of cortical scarring within the bilateral kidneys. Small simple appearing renal cortical cysts do not require specific imaging follow-up. The adrenals and bladder are unremarkable. Stomach/Bowel: No bowel obstruction or ileus. No bowel wall thickening or inflammatory change. Small hiatal hernia. Vascular/Lymphatic: Aortic atherosclerosis. No enlarged abdominal or pelvic lymph nodes. Reproductive: Uterus and bilateral adnexa are unremarkable. Other: No free fluid or free intraperitoneal gas. No abdominal wall hernia. Musculoskeletal: No acute or destructive bony abnormalities. Severe spondylosis at the L3-4 level. Reconstructed images demonstrate no additional findings. IMPRESSION: 1. Heterogeneous areas of decreased cortical enhancement within the left kidney, suspicious for pyelonephritis. Please correlate with urinalysis. 2. Otherwise no acute intra-abdominal or intrapelvic process. 3. Small hiatal hernia. 4.  Aortic Atherosclerosis (ICD10-I70.0). Electronically Signed   By: Ozell Daring M.D.   On: 03/18/2024 18:58   CT Head Wo Contrast Result Date: 03/18/2024 CLINICAL DATA:  Altered level of consciousness EXAM: CT HEAD WITHOUT CONTRAST TECHNIQUE: Contiguous axial images were obtained from the base of the skull through the vertex without intravenous contrast. RADIATION DOSE REDUCTION: This exam was performed according to the departmental dose-optimization program which includes automated exposure control, adjustment of the mA and/or kV according to patient size and/or use of iterative reconstruction technique. COMPARISON:   03/18/2024 at 4 p.m. FINDINGS: Brain: Stable chronic right PCA territory infarct with developing encephalomalacia. No evidence of acute infarct or hemorrhage. Lateral ventricles and midline structures are stable. No acute extra-axial fluid collections. No mass effect. Vascular: No hyperdense vessel or unexpected calcification. Skull: Normal. Negative for fracture or focal lesion. Sinuses/Orbits: No acute finding. Other: None. IMPRESSION: 1. Stable sequela of chronic right PCA territory infarct. 2. No acute intracranial process. Electronically Signed   By: Ozell Daring M.D.   On: 03/18/2024 18:54   CT Head Wo Contrast Result Date: 03/18/2024 CLINICAL DATA:  Altered level of consciousness, headache EXAM: CT HEAD WITHOUT CONTRAST TECHNIQUE: Contiguous axial images were obtained from the base of the skull through the vertex without intravenous contrast. RADIATION DOSE REDUCTION: This exam was performed according to the departmental dose-optimization program which includes automated exposure control, adjustment of the mA and/or kV according to patient size and/or use of iterative reconstruction technique. COMPARISON:  02/18/2024 FINDINGS: Examination is significantly limited by patient motion throughout the exam, despite multiple repeat imaging attempts. Brain: Continued evolution of the prior right PCA territory infarct. No evidence of hemorrhagic transformation. Stable chronic small-vessel ischemic changes within the right thalamus and periventricular white matter. No evidence of acute infarct or hemorrhage. Lateral ventricles and remaining midline structures are stable. No acute extra-axial fluid collections. No mass effect. Vascular: No hyperdense vessel or unexpected calcification. Skull: Normal. Negative for fracture or focal lesion. Sinuses/Orbits: No acute finding. Other: None. IMPRESSION: 1. Limited study due to patient motion throughout the exam. 2. Continued evolution of the chronic right PCA territory  infarct noted previously. No evidence of hemorrhagic transformation. 3. Otherwise no acute intracranial process. Electronically Signed   By: Ozell Daring M.D.   On: 03/18/2024 16:14     EKG: personally reviewed my interpretation is***. Prior EKG***  ASSESSMENT & PLAN:   Assessment & Plan by Problem: Active Problems:   Atrial fibrillation (HCC)  Hypertension   DM (diabetes mellitus) type II controlled with renal manifestation (HCC)   Hyperlipemia   Hyperthyroidism   Abdominal pain, epigastric   Weakness   Focal neurological deficit   Headache   Vomiting   Megan Fischer is a 73 y.o. person living with a history of right PCA stroke (01/2024), atrial fibrillation, hypertension, type 2 diabetes mellitus, hyperlipidemia, and multinodular goiter who presented with headache and generalized weakness and admitted for work up of acute headache and new-onset severe abdominal pain on hospital day 0.  *** ***  *** ***  *** ***  Best practice: Diet: {NAMES:3044014::Normal,Heart Healthy,Carb-Modified,Renal,Carb/Renal,NPO,TPN,Tube Feeds} VTE: {NAMES:3044014::Heparin,Enoxaparin,SCDs,DOAC,None} IVF: {NAMES:[id number]::None,NS,1/2 NS,LR,D5,D10},{NAMES:3044014::None,10cc/hr,25cc/hr,50cc/hr,75cc/hr,100cc/hr,110cc/hr,125cc/hr,Bolus} Code: {NAMES:3044014::Full,DNR,DNI,DNR/DNI,Comfort Care,Unknown}  Disposition planning: Prior to Admission Living Arrangement: {NAMES:[id number]::Home, living ***,SNF, ***,Homeless,***} Anticipated Discharge Location: {NAMES:[id number]::Home,SNF,CIR,***}  Dispo: Admit patient to {STATUS:3044014::Observation with expected length of stay less than 2 midnights.,Inpatient with expected length of stay greater than 2 midnights.}  Signed: Merilee Dwane RAMAN, Medical Student Internal Medicine Resident  03/18/2024, 11:09 PM  On Call pager: [phone]

## 2024-03-18 NOTE — ED Notes (Signed)
 Tech assisted pt onto bedside commode. Pt's brief full of urine. Pt attempted to urinate using bedside commode. Pt unable to urinate. Pt cleaned and changed into new brief. Pt is incontinent of urine at this point in time

## 2024-03-18 NOTE — ED Triage Notes (Signed)
 Pt BIB [facility] from home c/o increased weakness x 3 days per son. 3 days ago, son states she began to c/o a HA. PMHx of CVA in September 2025 with known L deficits. Pt c/o her HA behind her L eye. EMS notes L weakness compared to R. Pt with known confusion since her previous stroke. Pt taking Eliquis  as prescribed.

## 2024-03-18 NOTE — ED Provider Notes (Addendum)
 [place] EMERGENCY DEPARTMENT AT [facility] Provider Note   CSN: [id number] Arrival date & time: 03/18/24  1357     Patient presents with: Headache and Weakness   Megan Fischer is a 73 y.o. female.  Patient is a 73 year old female with a history of A-fib on Eliquis , recent CVA, type 2 diabetes, CHF who presents to the ED via EMS for intermittent headaches for the past 3 days as well as nausea/vomiting that began when she woke up this morning.  Patient notes she has had issues with headaches since she had a stroke and this feels similar.  She has been compliant with her Eliquis  and has not missed any doses.  States she is scheduled to see neurology in January 2026.  States her weakness does not feel worse but the nausea/vomiting is making her feel worse.  Denies dizziness, vision changes, chest pain, shortness of breath, abdominal pain, diarrhea, fevers.  Headache Associated symptoms: weakness   Associated symptoms: no dizziness, no fever and no seizures   Weakness Associated symptoms: headaches   Associated symptoms: no chest pain, no dizziness, no fever, no seizures and no shortness of breath        Prior to Admission medications   Medication Sig Start Date End Date Taking? Authorizing Provider  acetaminophen  (TYLENOL ) 325 MG tablet Take 1-2 tablets (325-650 mg total) by mouth every 4 (four) hours as needed for mild pain (pain score 1-3). 01/31/24   Love, Sharlet RAMAN, PA-C  amLODipine  (NORVASC ) 2.5 MG tablet Take 1 tablet (2.5 mg total) by mouth daily. 02/28/24   Amilibia, Jaden, DO  apixaban  (ELIQUIS ) 5 MG TABS tablet Take 1 tablet (5 mg total) by mouth 2 (two) times daily. 02/28/24   Amilibia, Jaden, DO  aspirin  EC 81 MG tablet Take 1 tablet (81 mg total) by mouth daily. Swallow whole. 02/28/24   Amilibia, Jaden, DO  benazepril  (LOTENSIN ) 20 MG tablet TAKE 1 TABLET BY MOUTH DAILY 02/26/24   Azadegan, Maryam, MD  Blood Glucose Monitoring Suppl DEVI 1 each by Does not apply  route as directed. Dispense based on patient and insurance preference. Use up to four times daily as directed. (FOR ICD-10 E10.9, E11.9). 02/28/24   Amilibia, Jaden, DO  CALCIUM  PO Take 1 tablet by mouth 2 (two) times daily.    [provider]  camphor-menthol  VIKKI) lotion Apply topically as needed for itching. 02/28/24   Amilibia, Jaden, DO  celecoxib (CELEBREX) 200 MG capsule Take 1 capsule (200 mg total) by mouth daily as needed for moderate pain (pain score 4-6). 02/28/24 03/29/24  Amilibia, Jaden, DO  diclofenac  Sodium (VOLTAREN ) 1 % GEL Apply 2 g topically 4 (four) times daily. To both knees 02/28/24   Amilibia, Jaden, DO  ezetimibe  (ZETIA ) 10 MG tablet Take 1 tablet (10 mg total) by mouth daily. 12/18/23 03/17/24  Ladona Heinz, MD  famotidine  (PEPCID ) 20 MG tablet Take 1 tablet (20 mg total) by mouth daily. 02/14/24   Love, Sharlet RAMAN, PA-C  ferrous sulfate  325 (65 FE) MG EC tablet Take 1 tablet (325 mg total) by mouth 2 (two) times daily. Patient taking differently: Take 325 mg by mouth daily. 09/27/14   Lanny Callander, MD  gabapentin  (NEURONTIN ) 100 MG capsule Take 1 capsule (100 mg total) by mouth 3 (three) times daily. 02/28/24   Amilibia, Jaden, DO  glimepiride  (AMARYL ) 1 MG tablet Take 1 tablet (1 mg total) by mouth daily with breakfast. 10/18/23   Thapa, Sudan, MD  Glucose Blood (BLOOD  GLUCOSE TEST STRIPS) STRP 1 each by Does not apply route as directed. Dispense based on patient and insurance preference. Use up to four times daily as directed. (FOR ICD-10 E10.9, E11.9). 02/28/24   Amilibia, Jaden, DO  Lancet Device MISC 1 each by Does not apply route as directed. Dispense based on patient and insurance preference. Use up to four times daily as directed. (FOR ICD-10 E10.9, E11.9). 02/28/24   Amilibia, Jaden, DO  Lancets MISC 1 each by Does not apply route as directed. Dispense based on patient and insurance preference. Use up to four times daily as directed. (FOR ICD-10 E10.9, E11.9).  02/28/24   Amilibia, Jaden, DO  lidocaine (LIDODERM) 5 % Place 2 patches onto the skin daily. Remove & Discard patch within 12 hours or as directed by MD 02/14/24   Love, Sharlet RAMAN, PA-C  methimazole  (TAPAZOLE ) 5 MG tablet TAKE 1 TABLET BY MOUTH DAILY 02/20/24   Thapa, Sudan, MD  methocarbamol  (ROBAXIN ) 500 MG tablet Take 1 tablet (500 mg total) by mouth every 6 (six) hours as needed for muscle spasms. 02/14/24   Love, Sharlet RAMAN, PA-C  metoprolol  succinate (TOPROL -XL) 50 MG 24 hr tablet TAKE 1 TABLET BY MOUTH DAILY  WITH OR IMMEDIATELY FOLLOWING A  MEAL 09/20/23   Trudy Mliss Dragon, MD  polyethylene glycol powder (GLYCOLAX /MIRALAX ) 17 GM/SCOOP powder Dissolve 1 capful (17g) in 4-8 ounces of liquid and take by mouth daily. 02/15/24   Love, Sharlet RAMAN, PA-C  rosuvastatin  (CRESTOR ) 20 MG tablet Take 1 tablet (20 mg total) by mouth daily after supper. 02/28/24   Amilibia, Jaden, DO  Semaglutide , 2 MG/DOSE, (OZEMPIC , 2 MG/DOSE,) 8 MG/3ML SOPN INJECT SUBCUTANEOUSLY 2 MG EVERY WEEK 06/22/23   Thapa, Sudan, MD  senna-docusate (SENOKOT-S) 8.6-50 MG tablet Take 2 tablets by mouth at bedtime. 02/14/24   Love, Sharlet RAMAN, PA-C  simethicone (MYLICON) 80 MG chewable tablet Chew 2 tablets (160 mg total) by mouth 4 (four) times daily. 02/14/24   Love, Sharlet RAMAN, PA-C    Allergies: Aspirin , Codeine, Fentanyl , Oxycodone hcl, Penicillins, and Chlorhexidine gluconate    Review of Systems  Constitutional:  Negative for chills and fever.  Eyes:  Negative for visual disturbance.  Respiratory:  Negative for shortness of breath.   Cardiovascular:  Negative for chest pain.  Neurological:  Positive for weakness and headaches. Negative for dizziness, seizures, syncope and speech difficulty.  Psychiatric/Behavioral:  Negative for agitation.   All other systems reviewed and are negative.   Updated Vital Signs BP (!) 175/148   Pulse (!) 102   Temp 98.5 F (36.9 C) (Oral)   Resp (!) 23   Ht 5' 3 (1.6 m)   Wt 80.7 kg   SpO2  92%   BMI 31.53 kg/m   Physical Exam Constitutional:      Appearance: She is well-developed.  HENT:     Head: Normocephalic and atraumatic.  Cardiovascular:     Rate and Rhythm: Tachycardia present.  Pulmonary:     Effort: Pulmonary effort is normal.     Breath sounds: Normal breath sounds.  Abdominal:     General: Bowel sounds are normal.     Palpations: Abdomen is soft.     Tenderness: There is no abdominal tenderness.  Musculoskeletal:     Cervical back: Normal range of motion.  Skin:    General: Skin is warm and dry.  Neurological:     Mental Status: She is alert.     GCS: GCS eye subscore is 4.  GCS verbal subscore is 5. GCS motor subscore is 6.     Cranial Nerves: No cranial nerve deficit.     Motor: Weakness present.     Comments: Chronic left-sided weakness with handgrip from previous CVA approximately 2 months prior.  No acute cranial nerve deficits noted.  Psychiatric:        Mood and Affect: Mood normal.        Behavior: Behavior normal.     (all labs ordered are listed, but only abnormal results are displayed) Labs Reviewed  I-STAT CHEM 8, ED - Abnormal; Notable for the following components:      Result Value   Creatinine, Ser 1.20 (*)    Glucose, Bld 144 (*)    Calcium , Ion 1.13 (*)    All other components within normal limits  BASIC METABOLIC PANEL WITH GFR  CBC WITH DIFFERENTIAL/PLATELET  SEDIMENTATION RATE  PROTIME-INR  MAGNESIUM  LIPASE, BLOOD  TROPONIN I (HIGH SENSITIVITY)    EKG: None  Radiology: No results found.     Medications Ordered in the ED  prochlorperazine  (COMPAZINE ) injection 10 mg (10 mg Intravenous Given 03/18/24 1442)  diphenhydrAMINE  (BENADRYL ) injection 25 mg (25 mg Intravenous Given 03/18/24 1442)                                   Medical Decision Making Patient is a 73 year old female with history of A-fib on Eliquis  and previous CVA 2 months prior presents to the ED for intermittent headaches for the past 3 days  as well as nausea/vomiting that began earlier this morning.  Patient detailed HPI above.  On exam patient is alert and in no acute distress.  Physical exam as noted above.  Chronic left-sided weakness noted from previous CVA but no acute deficits noted at this time.  Differential includes hypertensive urgency, migraine, tension headache, subarachnoid hemorrhage, TIA, CVA.  Initial labs shows stable creatinine and glucose.  Lab evaluation and CT of the head pending.  Patient has been having Compazine  and Benadryl  for headache, pending reevaluation.  This was noted to help headache when she was seen approximately 1 month ago.  Patient is noted to have followed up with neurology in January 2026.  Patient stable while in ED and pending disposition per workup. Patient signed out to Carlo Cornish, [place]  Amount and/or Complexity of Data Reviewed Labs: ordered. Radiology: ordered.  Risk Prescription drug management.      Final diagnoses:  None    ED Discharge Orders     None          Neysa Thersia RAMAN, PA-C 03/18/24 1500    Neysa Thersia RAMAN, PA-C 03/18/24 1514    Simon Lavonia SAILOR, MD 03/18/24 1536

## 2024-03-18 NOTE — H&P (Signed)
 Date: 03/18/2024               Patient Name:  Megan Fischer MRN: [id number]  DOB: 07/02/1950 Age / Sex: 73 y.o., female   PCP: Bernadine Manos, MD         Medical Service: Internal Medicine Teaching Service         Attending Physician: {[email or web address]/[id number]}      First Contact: {[id number]}}    Second Contact: {ResidentName25/26:[id number]}         Pager Information: First Contact Pager: [phone]   Second Contact Pager: [phone]   SUBJECTIVE   Chief Complaint: ***  History of Present Illness: Megan Fischer is a 73 y.o. female with PMH of ***.   Presents with *** Endorses *** Denies ***  *** ED Course: Labs significant for *** Imaging *** Received *** Consulted ***  Meds:  Patient reported: ***  No outpatient medications have been marked as taking for the 03/18/24 encounter [facility] Encounter).    Past Medical History ***  Past Surgical History Past Surgical History:  Procedure Laterality Date   CHOLECYSTECTOMY     REPLACEMENT TOTAL KNEE BILATERAL     SHOULDER SURGERY     RIGHT SHOULDER   TUBAL LIGATION       Social:  Lives With: Occupation: Retired Support: Level of Function:  Independent in ADL, walker PCP:  Bernadine Manos, MD  Substances: -Tobacco: Denied -Alcohol: Denied -Recreational Drug: Denied  Family History:  Family History  Problem Relation Age of Onset   Heart attack Mother    Stroke Mother    Diabetes Mother    Hypertension Brother    Breast cancer Neg Hx      Allergies: Allergies as of 03/18/2024 - Review Complete 03/18/2024  Allergen Reaction Noted   Aspirin  Nausea Only 11/16/2020   Codeine Other (See Comments) 08/25/2010   Fentanyl  Other (See Comments) 11/12/2020   Oxycodone hcl Other (See Comments) 08/25/2010   Penicillins Other (See Comments) 08/25/2010   Chlorhexidine gluconate Rash 06/04/2015    Review of Systems: A complete ROS was negative except as per HPI.   OBJECTIVE:   Physical  Exam: Blood pressure (!) 148/102, pulse 100, temperature 99.6 F (37.6 C), temperature source Oral, resp. rate (!) 22, height 5' 3 (1.6 m), weight 80.7 kg, SpO2 100%.  Constitutional: well-appearing *** sitting in ***, in no acute distress HENT: normocephalic atraumatic, mucous membranes moist Eyes: conjunctiva non-erythematous Neck: supple Cardiovascular: regular rate and rhythm, no m/r/g Pulmonary/Chest: normal work of breathing on room air, lungs clear to auscultation bilaterally Abdominal: soft, non-tender, non-distended MSK: normal bulk and tone Neurological: alert & oriented x 3, 5/5 strength in bilateral upper and lower extremities, normal gait Skin: warm and dry Psych: ***  Labs: CBC    Component Value Date/Time   WBC 4.8 03/18/2024 1446   RBC 4.96 03/18/2024 1446   HGB 14.6 03/18/2024 1453   HGB 12.7 02/28/2024 1120   HGB 12.0 02/16/2017 0802   HCT 43.0 03/18/2024 1453   HCT 39.5 02/28/2024 1120   HCT 36.8 02/16/2017 0802   PLT 188 03/18/2024 1446   PLT 231 02/28/2024 1120   MCV 88.3 03/18/2024 1446   MCV 91 02/28/2024 1120   MCV 89.3 02/16/2017 0802   MCH 28.2 03/18/2024 1446   MCHC 32.0 03/18/2024 1446   RDW 14.6 03/18/2024 1446   RDW 13.4 02/28/2024 1120   RDW 15.6 (H) 02/16/2017 0802   LYMPHSABS 1.3 03/18/2024 1446   LYMPHSABS 1.4 02/28/2024 1120  LYMPHSABS 1.9 02/16/2017 0802   MONOABS 0.3 03/18/2024 1446   MONOABS 0.2 02/16/2017 0802   EOSABS 0.0 03/18/2024 1446   EOSABS 0.0 02/28/2024 1120   EOSABS 0.2 02/05/2006 0932   BASOSABS 0.0 03/18/2024 1446   BASOSABS 0.0 02/28/2024 1120   BASOSABS 0.0 02/16/2017 0802     CMP     Component Value Date/Time   NA 143 03/18/2024 1453   NA 145 (H) 02/28/2024 1120   NA 143 02/16/2017 0802   K 3.6 03/18/2024 1453   K 3.5 02/16/2017 0802   CL 109 03/18/2024 1453   CO2 20 (L) 03/18/2024 1446   CO2 26 02/16/2017 0802   GLUCOSE 144 (H) 03/18/2024 1453   GLUCOSE 92 02/16/2017 0802   BUN 20 03/18/2024 1453    BUN 22 02/28/2024 1120   BUN 17.2 02/16/2017 0802   CREATININE 1.20 (H) 03/18/2024 1453   CREATININE 1.33 (H) 10/12/2023 0859   CREATININE 0.9 02/16/2017 0802   CALCIUM  9.4 03/18/2024 1446   CALCIUM  9.9 02/16/2017 0802   PROT 8.1 01/25/2024 1303   PROT 7.5 02/16/2017 0802   ALBUMIN 3.7 01/25/2024 1303   ALBUMIN 3.9 02/16/2017 0802   AST 35 01/25/2024 1303   AST 25 08/08/2023 0813   AST 26 02/16/2017 0802   ALT 22 01/25/2024 1303   ALT 18 08/08/2023 0813   ALT 18 02/16/2017 0802   ALKPHOS 103 01/25/2024 1303   ALKPHOS 64 02/16/2017 0802   BILITOT 0.7 01/25/2024 1303   BILITOT 0.5 08/08/2023 0813   BILITOT 0.58 02/16/2017 0802   GFRNONAA 50 (L) 03/18/2024 1446   GFRNONAA 40 (L) 08/08/2023 0813   GFRAA >60 05/17/2018 0807    Imaging: *** CT ABDOMEN PELVIS W CONTRAST Result Date: 03/18/2024 CLINICAL DATA:  Acute abdominal pain EXAM: CT ABDOMEN AND PELVIS WITH CONTRAST TECHNIQUE: Multidetector CT imaging of the abdomen and pelvis was performed using the standard protocol following bolus administration of intravenous contrast. RADIATION DOSE REDUCTION: This exam was performed according to the departmental dose-optimization program which includes automated exposure control, adjustment of the mA and/or kV according to patient size and/or use of iterative reconstruction technique. CONTRAST:  75mL OMNIPAQUE  IOHEXOL  350 MG/ML SOLN COMPARISON:  02/18/2024 FINDINGS: Lower chest: No acute pleural or parenchymal lung disease. Hepatobiliary: No focal liver abnormality is seen. Status post cholecystectomy. No biliary dilatation. Pancreas: Unremarkable. No pancreatic ductal dilatation or surrounding inflammatory changes. Spleen: Normal in size without focal abnormality. Adrenals/Urinary Tract: Heterogeneous areas of decreased cortical enhancement are seen within the dorsal aspect of the left kidney, which could reflect acute pyelonephritis. Please correlate with urinalysis. There are other scattered  areas of cortical scarring within the bilateral kidneys. Small simple appearing renal cortical cysts do not require specific imaging follow-up. The adrenals and bladder are unremarkable. Stomach/Bowel: No bowel obstruction or ileus. No bowel wall thickening or inflammatory change. Small hiatal hernia. Vascular/Lymphatic: Aortic atherosclerosis. No enlarged abdominal or pelvic lymph nodes. Reproductive: Uterus and bilateral adnexa are unremarkable. Other: No free fluid or free intraperitoneal gas. No abdominal wall hernia. Musculoskeletal: No acute or destructive bony abnormalities. Severe spondylosis at the L3-4 level. Reconstructed images demonstrate no additional findings. IMPRESSION: 1. Heterogeneous areas of decreased cortical enhancement within the left kidney, suspicious for pyelonephritis. Please correlate with urinalysis. 2. Otherwise no acute intra-abdominal or intrapelvic process. 3. Small hiatal hernia. 4.  Aortic Atherosclerosis (ICD10-I70.0). Electronically Signed   By: Ozell Daring M.D.   On: 03/18/2024 18:58   CT Head Wo Contrast Result Date:  03/18/2024 CLINICAL DATA:  Altered level of consciousness EXAM: CT HEAD WITHOUT CONTRAST TECHNIQUE: Contiguous axial images were obtained from the base of the skull through the vertex without intravenous contrast. RADIATION DOSE REDUCTION: This exam was performed according to the departmental dose-optimization program which includes automated exposure control, adjustment of the mA and/or kV according to patient size and/or use of iterative reconstruction technique. COMPARISON:  03/18/2024 at 4 p.m. FINDINGS: Brain: Stable chronic right PCA territory infarct with developing encephalomalacia. No evidence of acute infarct or hemorrhage. Lateral ventricles and midline structures are stable. No acute extra-axial fluid collections. No mass effect. Vascular: No hyperdense vessel or unexpected calcification. Skull: Normal. Negative for fracture or focal lesion.  Sinuses/Orbits: No acute finding. Other: None. IMPRESSION: 1. Stable sequela of chronic right PCA territory infarct. 2. No acute intracranial process. Electronically Signed   By: Ozell Daring M.D.   On: 03/18/2024 18:54   CT Head Wo Contrast Result Date: 03/18/2024 CLINICAL DATA:  Altered level of consciousness, headache EXAM: CT HEAD WITHOUT CONTRAST TECHNIQUE: Contiguous axial images were obtained from the base of the skull through the vertex without intravenous contrast. RADIATION DOSE REDUCTION: This exam was performed according to the departmental dose-optimization program which includes automated exposure control, adjustment of the mA and/or kV according to patient size and/or use of iterative reconstruction technique. COMPARISON:  02/18/2024 FINDINGS: Examination is significantly limited by patient motion throughout the exam, despite multiple repeat imaging attempts. Brain: Continued evolution of the prior right PCA territory infarct. No evidence of hemorrhagic transformation. Stable chronic small-vessel ischemic changes within the right thalamus and periventricular white matter. No evidence of acute infarct or hemorrhage. Lateral ventricles and remaining midline structures are stable. No acute extra-axial fluid collections. No mass effect. Vascular: No hyperdense vessel or unexpected calcification. Skull: Normal. Negative for fracture or focal lesion. Sinuses/Orbits: No acute finding. Other: None. IMPRESSION: 1. Limited study due to patient motion throughout the exam. 2. Continued evolution of the chronic right PCA territory infarct noted previously. No evidence of hemorrhagic transformation. 3. Otherwise no acute intracranial process. Electronically Signed   By: Ozell Daring M.D.   On: 03/18/2024 16:14     EKG: personally reviewed my interpretation is***. Prior EKG***  ASSESSMENT & PLAN:   Assessment & Plan by Problem: Active Problems:   Atrial fibrillation (HCC)   Hypertension   DM  (diabetes mellitus) type II controlled with renal manifestation (HCC)   Acute ischemic right PCA stroke (HCC)   Hyperlipemia   Hyperthyroidism   Encephalopathy   Megan Fischer is a 73 y.o. person living with a history of *** who presented with *** and admitted for *** on hospital day 0  Patient is oriented to place, person and why he is here. Headache is over left eye and it is sharp, had this pain for a 3-4 days in the same spot, does not radiate to other sites, she repeatedly wants coke and can see coke in [place] hand, she has dyspepsia and also has N/V which started today. Denied pain, dizziness, weakness, tingling in fingers. Denied fall, trauma, sob and cp recently.   Imbalance: Weakness: Fever:feel warm Dysuria: normal Bowl movement: normal  P/E: Left side peripheral vision loss Positive drift in left side Normal finger to nose,   Best practice: Diet: {[id number]::Normal,Heart Healthy,Carb-Modified,Renal,Carb/Renal,NPO,TPN,Tube Feeds} VTE: {[id number]::Heparin,Enoxaparin,SCDs,DOAC,None} IVF: {[id number]::None,NS,1/2 NS,LR,D5,D10},{[id number]::None,10cc/hr,25cc/hr,50cc/hr,75cc/hr,100cc/hr,110cc/hr,125cc/hr,Bolus} Code: {[id number]::Full,DNR,DNI,DNR/DNI,Comfort Care,Unknown}  Disposition planning: Prior to Admission Living Arrangement: {[id number]::Home, living ***,SNF, ***,Homeless,***} Anticipated Discharge Location: {[id number]::Home,SNF,CIR,***}  Dispo: Admit patient to {[id number]::[facility] with expected length  of stay less than 2 midnights.,Inpatient with expected length of stay greater than 2 midnights.}  Signed: Bernadine Manos, MD Internal Medicine Resident  03/18/2024, 10:14 PM  On Call pager: [phone]

## 2024-03-19 ENCOUNTER — Other Ambulatory Visit: Payer: Self-pay

## 2024-03-19 ENCOUNTER — Inpatient Hospital Stay (HOSPITAL_COMMUNITY)

## 2024-03-19 DIAGNOSIS — I2699 Other pulmonary embolism without acute cor pulmonale: Secondary | ICD-10-CM | POA: Diagnosis not present

## 2024-03-19 DIAGNOSIS — G8114 Spastic hemiplegia affecting left nondominant side: Secondary | ICD-10-CM | POA: Diagnosis not present

## 2024-03-19 DIAGNOSIS — Z515 Encounter for palliative care: Secondary | ICD-10-CM | POA: Diagnosis not present

## 2024-03-19 DIAGNOSIS — E1110 Type 2 diabetes mellitus with ketoacidosis without coma: Secondary | ICD-10-CM

## 2024-03-19 DIAGNOSIS — Z6832 Body mass index (BMI) 32.0-32.9, adult: Secondary | ICD-10-CM | POA: Diagnosis not present

## 2024-03-19 DIAGNOSIS — I5031 Acute diastolic (congestive) heart failure: Secondary | ICD-10-CM | POA: Diagnosis present

## 2024-03-19 DIAGNOSIS — K5900 Constipation, unspecified: Secondary | ICD-10-CM | POA: Diagnosis present

## 2024-03-19 DIAGNOSIS — Z833 Family history of diabetes mellitus: Secondary | ICD-10-CM

## 2024-03-19 DIAGNOSIS — I129 Hypertensive chronic kidney disease with stage 1 through stage 4 chronic kidney disease, or unspecified chronic kidney disease: Secondary | ICD-10-CM

## 2024-03-19 DIAGNOSIS — K5901 Slow transit constipation: Secondary | ICD-10-CM | POA: Diagnosis not present

## 2024-03-19 DIAGNOSIS — Z66 Do not resuscitate: Secondary | ICD-10-CM | POA: Diagnosis present

## 2024-03-19 DIAGNOSIS — R918 Other nonspecific abnormal finding of lung field: Secondary | ICD-10-CM | POA: Diagnosis present

## 2024-03-19 DIAGNOSIS — E440 Moderate protein-calorie malnutrition: Secondary | ICD-10-CM | POA: Diagnosis present

## 2024-03-19 DIAGNOSIS — Z7901 Long term (current) use of anticoagulants: Secondary | ICD-10-CM

## 2024-03-19 DIAGNOSIS — E049 Nontoxic goiter, unspecified: Secondary | ICD-10-CM

## 2024-03-19 DIAGNOSIS — I071 Rheumatic tricuspid insufficiency: Secondary | ICD-10-CM | POA: Diagnosis present

## 2024-03-19 DIAGNOSIS — I639 Cerebral infarction, unspecified: Secondary | ICD-10-CM | POA: Diagnosis not present

## 2024-03-19 DIAGNOSIS — G9340 Encephalopathy, unspecified: Secondary | ICD-10-CM

## 2024-03-19 DIAGNOSIS — E039 Hypothyroidism, unspecified: Secondary | ICD-10-CM | POA: Diagnosis present

## 2024-03-19 DIAGNOSIS — Z7189 Other specified counseling: Secondary | ICD-10-CM | POA: Diagnosis not present

## 2024-03-19 DIAGNOSIS — I4891 Unspecified atrial fibrillation: Secondary | ICD-10-CM | POA: Diagnosis not present

## 2024-03-19 DIAGNOSIS — I519 Heart disease, unspecified: Secondary | ICD-10-CM | POA: Diagnosis not present

## 2024-03-19 DIAGNOSIS — R600 Localized edema: Secondary | ICD-10-CM

## 2024-03-19 DIAGNOSIS — N184 Chronic kidney disease, stage 4 (severe): Secondary | ICD-10-CM | POA: Diagnosis present

## 2024-03-19 DIAGNOSIS — K219 Gastro-esophageal reflux disease without esophagitis: Secondary | ICD-10-CM | POA: Diagnosis present

## 2024-03-19 DIAGNOSIS — D649 Anemia, unspecified: Secondary | ICD-10-CM | POA: Diagnosis not present

## 2024-03-19 DIAGNOSIS — E119 Type 2 diabetes mellitus without complications: Secondary | ICD-10-CM | POA: Diagnosis not present

## 2024-03-19 DIAGNOSIS — Z8673 Personal history of transient ischemic attack (TIA), and cerebral infarction without residual deficits: Secondary | ICD-10-CM | POA: Diagnosis not present

## 2024-03-19 DIAGNOSIS — Z7984 Long term (current) use of oral hypoglycemic drugs: Secondary | ICD-10-CM | POA: Diagnosis not present

## 2024-03-19 DIAGNOSIS — I69320 Aphasia following cerebral infarction: Secondary | ICD-10-CM | POA: Diagnosis not present

## 2024-03-19 DIAGNOSIS — I69354 Hemiplegia and hemiparesis following cerebral infarction affecting left non-dominant side: Secondary | ICD-10-CM | POA: Diagnosis not present

## 2024-03-19 DIAGNOSIS — E1122 Type 2 diabetes mellitus with diabetic chronic kidney disease: Secondary | ICD-10-CM | POA: Diagnosis present

## 2024-03-19 DIAGNOSIS — I63431 Cerebral infarction due to embolism of right posterior cerebral artery: Secondary | ICD-10-CM | POA: Diagnosis present

## 2024-03-19 DIAGNOSIS — R410 Disorientation, unspecified: Secondary | ICD-10-CM | POA: Insufficient documentation

## 2024-03-19 DIAGNOSIS — R9401 Abnormal electroencephalogram [EEG]: Secondary | ICD-10-CM | POA: Diagnosis not present

## 2024-03-19 DIAGNOSIS — I2693 Single subsegmental pulmonary embolism without acute cor pulmonale: Secondary | ICD-10-CM | POA: Diagnosis present

## 2024-03-19 DIAGNOSIS — E869 Volume depletion, unspecified: Secondary | ICD-10-CM | POA: Diagnosis not present

## 2024-03-19 DIAGNOSIS — Z8249 Family history of ischemic heart disease and other diseases of the circulatory system: Secondary | ICD-10-CM | POA: Diagnosis not present

## 2024-03-19 DIAGNOSIS — I272 Pulmonary hypertension, unspecified: Secondary | ICD-10-CM | POA: Diagnosis not present

## 2024-03-19 DIAGNOSIS — N189 Chronic kidney disease, unspecified: Secondary | ICD-10-CM | POA: Diagnosis not present

## 2024-03-19 DIAGNOSIS — E1149 Type 2 diabetes mellitus with other diabetic neurological complication: Secondary | ICD-10-CM | POA: Diagnosis not present

## 2024-03-19 DIAGNOSIS — I2721 Secondary pulmonary arterial hypertension: Secondary | ICD-10-CM | POA: Diagnosis present

## 2024-03-19 DIAGNOSIS — E8729 Other acidosis: Secondary | ICD-10-CM | POA: Insufficient documentation

## 2024-03-19 DIAGNOSIS — R414 Neurologic neglect syndrome: Secondary | ICD-10-CM | POA: Diagnosis not present

## 2024-03-19 DIAGNOSIS — I69319 Unspecified symptoms and signs involving cognitive functions following cerebral infarction: Secondary | ICD-10-CM | POA: Diagnosis not present

## 2024-03-19 DIAGNOSIS — E1165 Type 2 diabetes mellitus with hyperglycemia: Secondary | ICD-10-CM | POA: Diagnosis not present

## 2024-03-19 DIAGNOSIS — R519 Headache, unspecified: Secondary | ICD-10-CM

## 2024-03-19 DIAGNOSIS — I10 Essential (primary) hypertension: Secondary | ICD-10-CM | POA: Diagnosis not present

## 2024-03-19 DIAGNOSIS — Z79899 Other long term (current) drug therapy: Secondary | ICD-10-CM

## 2024-03-19 DIAGNOSIS — R112 Nausea with vomiting, unspecified: Secondary | ICD-10-CM

## 2024-03-19 DIAGNOSIS — N179 Acute kidney failure, unspecified: Secondary | ICD-10-CM | POA: Diagnosis present

## 2024-03-19 DIAGNOSIS — I959 Hypotension, unspecified: Secondary | ICD-10-CM | POA: Diagnosis not present

## 2024-03-19 DIAGNOSIS — D631 Anemia in chronic kidney disease: Secondary | ICD-10-CM | POA: Diagnosis present

## 2024-03-19 DIAGNOSIS — Z823 Family history of stroke: Secondary | ICD-10-CM

## 2024-03-19 DIAGNOSIS — N183 Chronic kidney disease, stage 3 unspecified: Secondary | ICD-10-CM

## 2024-03-19 DIAGNOSIS — I63511 Cerebral infarction due to unspecified occlusion or stenosis of right middle cerebral artery: Secondary | ICD-10-CM | POA: Diagnosis present

## 2024-03-19 DIAGNOSIS — I130 Hypertensive heart and chronic kidney disease with heart failure and stage 1 through stage 4 chronic kidney disease, or unspecified chronic kidney disease: Secondary | ICD-10-CM | POA: Diagnosis present

## 2024-03-19 DIAGNOSIS — R1310 Dysphagia, unspecified: Secondary | ICD-10-CM | POA: Diagnosis not present

## 2024-03-19 DIAGNOSIS — I4821 Permanent atrial fibrillation: Secondary | ICD-10-CM | POA: Diagnosis present

## 2024-03-19 DIAGNOSIS — Z7982 Long term (current) use of aspirin: Secondary | ICD-10-CM | POA: Diagnosis not present

## 2024-03-19 DIAGNOSIS — G928 Other toxic encephalopathy: Secondary | ICD-10-CM | POA: Diagnosis present

## 2024-03-19 DIAGNOSIS — Z794 Long term (current) use of insulin: Secondary | ICD-10-CM | POA: Diagnosis not present

## 2024-03-19 DIAGNOSIS — F05 Delirium due to known physiological condition: Secondary | ICD-10-CM | POA: Diagnosis present

## 2024-03-19 DIAGNOSIS — E785 Hyperlipidemia, unspecified: Secondary | ICD-10-CM | POA: Diagnosis present

## 2024-03-19 DIAGNOSIS — R233 Spontaneous ecchymoses: Secondary | ICD-10-CM | POA: Diagnosis not present

## 2024-03-19 DIAGNOSIS — E871 Hypo-osmolality and hyponatremia: Secondary | ICD-10-CM | POA: Diagnosis not present

## 2024-03-19 DIAGNOSIS — E0520 Thyrotoxicosis with toxic multinodular goiter without thyrotoxic crisis or storm: Secondary | ICD-10-CM | POA: Diagnosis present

## 2024-03-19 DIAGNOSIS — E0590 Thyrotoxicosis, unspecified without thyrotoxic crisis or storm: Secondary | ICD-10-CM | POA: Diagnosis present

## 2024-03-19 DIAGNOSIS — E874 Mixed disorder of acid-base balance: Secondary | ICD-10-CM | POA: Diagnosis present

## 2024-03-19 DIAGNOSIS — R29725 NIHSS score 25: Secondary | ICD-10-CM | POA: Diagnosis not present

## 2024-03-19 DIAGNOSIS — N12 Tubulo-interstitial nephritis, not specified as acute or chronic: Secondary | ICD-10-CM | POA: Diagnosis present

## 2024-03-19 DIAGNOSIS — E872 Acidosis, unspecified: Secondary | ICD-10-CM | POA: Insufficient documentation

## 2024-03-19 DIAGNOSIS — R569 Unspecified convulsions: Secondary | ICD-10-CM | POA: Diagnosis present

## 2024-03-19 DIAGNOSIS — E1129 Type 2 diabetes mellitus with other diabetic kidney complication: Secondary | ICD-10-CM | POA: Diagnosis not present

## 2024-03-19 DIAGNOSIS — I63231 Cerebral infarction due to unspecified occlusion or stenosis of right carotid arteries: Secondary | ICD-10-CM | POA: Diagnosis not present

## 2024-03-19 HISTORY — DX: Type 2 diabetes mellitus with ketoacidosis without coma: E11.10

## 2024-03-19 LAB — CBC WITH DIFFERENTIAL/PLATELET
Abs Immature Granulocytes: 0.03 K/uL (ref 0.00–0.07)
Basophils Absolute: 0 K/uL (ref 0.0–0.1)
Basophils Relative: 0 %
Eosinophils Absolute: 0 K/uL (ref 0.0–0.5)
Eosinophils Relative: 0 %
HCT: 42.9 % (ref 36.0–46.0)
Hemoglobin: 14.6 g/dL (ref 12.0–15.0)
Immature Granulocytes: 1 %
Lymphocytes Relative: 15 %
Lymphs Abs: 1 K/uL (ref 0.7–4.0)
MCH: 28.7 pg (ref 26.0–34.0)
MCHC: 34 g/dL (ref 30.0–36.0)
MCV: 84.4 fL (ref 80.0–100.0)
Monocytes Absolute: 0.3 K/uL (ref 0.1–1.0)
Monocytes Relative: 4 %
Neutro Abs: 5.4 K/uL (ref 1.7–7.7)
Neutrophils Relative %: 80 %
Platelets: 195 K/uL (ref 150–400)
RBC: 5.08 MIL/uL (ref 3.87–5.11)
RDW: 14.6 % (ref 11.5–15.5)
WBC: 6.6 K/uL (ref 4.0–10.5)
nRBC: 0 % (ref 0.0–0.2)

## 2024-03-19 LAB — BASIC METABOLIC PANEL WITH GFR
Anion gap: 19 — ABNORMAL HIGH (ref 5–15)
Anion gap: 19 — ABNORMAL HIGH (ref 5–15)
Anion gap: 19 — ABNORMAL HIGH (ref 5–15)
BUN: 15 mg/dL (ref 8–23)
BUN: 16 mg/dL (ref 8–23)
BUN: 15 mg/dL (ref 8–23)
CO2: 23 mmol/L (ref 22–32)
CO2: 23 mmol/L (ref 22–32)
CO2: 23 mmol/L (ref 22–32)
Calcium: 9.1 mg/dL (ref 8.9–10.3)
Calcium: 9.2 mg/dL (ref 8.9–10.3)
Calcium: 8.9 mg/dL (ref 8.9–10.3)
Chloride: 94 mmol/L — ABNORMAL LOW (ref 98–111)
Chloride: 94 mmol/L — ABNORMAL LOW (ref 98–111)
Chloride: 92 mmol/L — ABNORMAL LOW (ref 98–111)
Creatinine, Ser: 1 mg/dL (ref 0.44–1.00)
Creatinine, Ser: 1.02 mg/dL — ABNORMAL HIGH (ref 0.44–1.00)
Creatinine, Ser: 0.96 mg/dL (ref 0.44–1.00)
GFR, Estimated: 59 mL/min — ABNORMAL LOW (ref >60)
GFR, Estimated: 58 mL/min — ABNORMAL LOW (ref >60)
GFR, Estimated: >60 mL/min (ref >60)
Glucose, Bld: 147 mg/dL — ABNORMAL HIGH (ref 70–99)
Glucose, Bld: 142 mg/dL — ABNORMAL HIGH (ref 70–99)
Glucose, Bld: 110 mg/dL — ABNORMAL HIGH (ref 70–99)
Potassium: 3.4 mmol/L — ABNORMAL LOW (ref 3.5–5.1)
Potassium: 3.6 mmol/L (ref 3.5–5.1)
Potassium: 3.2 mmol/L — ABNORMAL LOW (ref 3.5–5.1)
Sodium: 136 mmol/L (ref 135–145)
Sodium: 136 mmol/L (ref 135–145)
Sodium: 134 mmol/L — ABNORMAL LOW (ref 135–145)

## 2024-03-19 LAB — URINE CULTURE: Culture: NO GROWTH

## 2024-03-19 LAB — GLUCOSE, CAPILLARY
Glucose-Capillary: 105 mg/dL — ABNORMAL HIGH (ref 70–99)
Glucose-Capillary: 118 mg/dL — ABNORMAL HIGH (ref 70–99)
Glucose-Capillary: 120 mg/dL — ABNORMAL HIGH (ref 70–99)
Glucose-Capillary: 130 mg/dL — ABNORMAL HIGH (ref 70–99)
Glucose-Capillary: 135 mg/dL — ABNORMAL HIGH (ref 70–99)
Glucose-Capillary: 151 mg/dL — ABNORMAL HIGH (ref 70–99)
Glucose-Capillary: 163 mg/dL — ABNORMAL HIGH (ref 70–99)
Glucose-Capillary: 98 mg/dL (ref 70–99)

## 2024-03-19 LAB — ECHOCARDIOGRAM COMPLETE
Height: 63 in
S' Lateral: 3.3 cm
Weight: 2848 [oz_av]

## 2024-03-19 LAB — URINALYSIS, ROUTINE W REFLEX MICROSCOPIC
Bacteria, UA: NONE SEEN
Bilirubin Urine: NEGATIVE
Glucose, UA: >=500 mg/dL — AB
Ketones, ur: 20 mg/dL — AB
Leukocytes,Ua: NEGATIVE
Nitrite: NEGATIVE
Protein, ur: 100 mg/dL — AB
Specific Gravity, Urine: 1.01 (ref 1.005–1.030)
pH: 7 (ref 5.0–8.0)

## 2024-03-19 LAB — COMPREHENSIVE METABOLIC PANEL WITH GFR
ALT: 14 U/L (ref 0–44)
ALT: 15 U/L (ref 0–44)
AST: 23 U/L (ref 15–41)
AST: 28 U/L (ref 15–41)
Albumin: 3.6 g/dL (ref 3.5–5.0)
Albumin: 3.6 g/dL (ref 3.5–5.0)
Alkaline Phosphatase: 103 U/L (ref 38–126)
Alkaline Phosphatase: 104 U/L (ref 38–126)
Anion gap: 19 — ABNORMAL HIGH (ref 5–15)
Anion gap: 20 — ABNORMAL HIGH (ref 5–15)
BUN: 16 mg/dL (ref 8–23)
BUN: 16 mg/dL (ref 8–23)
CO2: 18 mmol/L — ABNORMAL LOW (ref 22–32)
CO2: 19 mmol/L — ABNORMAL LOW (ref 22–32)
Calcium: 8.8 mg/dL — ABNORMAL LOW (ref 8.9–10.3)
Calcium: 9.1 mg/dL (ref 8.9–10.3)
Chloride: 101 mmol/L (ref 98–111)
Chloride: 102 mmol/L (ref 98–111)
Creatinine, Ser: 1.1 mg/dL — ABNORMAL HIGH (ref 0.44–1.00)
Creatinine, Ser: 1.13 mg/dL — ABNORMAL HIGH (ref 0.44–1.00)
GFR, Estimated: 51 mL/min — ABNORMAL LOW (ref >60)
GFR, Estimated: 53 mL/min — ABNORMAL LOW (ref >60)
Glucose, Bld: 151 mg/dL — ABNORMAL HIGH (ref 70–99)
Glucose, Bld: 197 mg/dL — ABNORMAL HIGH (ref 70–99)
Potassium: 3.5 mmol/L (ref 3.5–5.1)
Potassium: 3.7 mmol/L (ref 3.5–5.1)
Sodium: 139 mmol/L (ref 135–145)
Sodium: 140 mmol/L (ref 135–145)
Total Bilirubin: 0.9 mg/dL (ref 0.0–1.2)
Total Bilirubin: 1.2 mg/dL (ref 0.0–1.2)
Total Protein: 7.5 g/dL (ref 6.5–8.1)
Total Protein: 7.7 g/dL (ref 6.5–8.1)

## 2024-03-19 LAB — BRAIN NATRIURETIC PEPTIDE: B Natriuretic Peptide: 789.8 pg/mL — ABNORMAL HIGH (ref 0.0–100.0)

## 2024-03-19 LAB — ETHANOL: Alcohol, Ethyl (B): <15 mg/dL (ref <15)

## 2024-03-19 LAB — I-STAT CG4 LACTIC ACID, ED
Lactic Acid, Venous: 2.4 mmol/L (ref 0.5–1.9)
Lactic Acid, Venous: 3 mmol/L (ref 0.5–1.9)

## 2024-03-19 LAB — TSH: TSH: <0.1 u[IU]/mL — ABNORMAL LOW (ref 0.350–4.500)

## 2024-03-19 LAB — CBG MONITORING, ED
Glucose-Capillary: 170 mg/dL — ABNORMAL HIGH (ref 70–99)
Glucose-Capillary: 141 mg/dL — ABNORMAL HIGH (ref 70–99)

## 2024-03-19 LAB — RAPID URINE DRUG SCREEN, HOSP PERFORMED
Amphetamines: NOT DETECTED
Barbiturates: NOT DETECTED
Benzodiazepines: POSITIVE — AB
Cocaine: NOT DETECTED
Opiates: NOT DETECTED
Tetrahydrocannabinol: NOT DETECTED

## 2024-03-19 LAB — T4, FREE: Free T4: 1.96 ng/dL — ABNORMAL HIGH (ref 0.61–1.12)

## 2024-03-19 LAB — BLOOD GAS, VENOUS
Acid-base deficit: 0.1 mmol/L (ref 0.0–2.0)
Bicarbonate: 23.9 mmol/L (ref 20.0–28.0)
Drawn by: 68285
O2 Saturation: 91.2 %
Patient temperature: 37
pCO2, Ven: 36 mmHg — ABNORMAL LOW (ref 44–60)
pH, Ven: 7.43 (ref 7.25–7.43)
pO2, Ven: 60 mmHg — ABNORMAL HIGH (ref 32–45)

## 2024-03-19 LAB — CBC
HCT: 38.5 % (ref 36.0–46.0)
Hemoglobin: 12.4 g/dL (ref 12.0–15.0)
MCH: 28.3 pg (ref 26.0–34.0)
MCHC: 32.2 g/dL (ref 30.0–36.0)
MCV: 87.9 fL (ref 80.0–100.0)
Platelets: 172 K/uL (ref 150–400)
RBC: 4.38 MIL/uL (ref 3.87–5.11)
RDW: 14.7 % (ref 11.5–15.5)
WBC: 6.8 K/uL (ref 4.0–10.5)
nRBC: 0 % (ref 0.0–0.2)

## 2024-03-19 LAB — BETA-HYDROXYBUTYRIC ACID: Beta-Hydroxybutyric Acid: 2.59 mmol/L — ABNORMAL HIGH (ref 0.05–0.27)

## 2024-03-19 LAB — LACTIC ACID, PLASMA
Lactic Acid, Venous: 3.8 mmol/L (ref 0.5–1.9)
Lactic Acid, Venous: 3 mmol/L (ref 0.5–1.9)
Lactic Acid, Venous: 2.4 mmol/L (ref 0.5–1.9)

## 2024-03-19 LAB — CK: Total CK: 320 U/L — ABNORMAL HIGH (ref 38–234)

## 2024-03-19 LAB — SALICYLATE LEVEL: Salicylate Lvl: <7 mg/dL — ABNORMAL LOW (ref 7.0–30.0)

## 2024-03-19 MED ORDER — ACETAMINOPHEN 500 MG PO TABS
1000.0000 mg | ORAL_TABLET | Freq: Four times a day (QID) | ORAL | Status: DC
  Administered 2024-03-19 – 2024-03-21 (×8): 1000 mg via ORAL
  Filled 2024-03-19 (×10): qty 2

## 2024-03-19 MED ORDER — HYDROCORTISONE SOD SUC (PF) 100 MG IJ SOLR: 100.0000 mg | Freq: Three times a day (TID) | INTRAMUSCULAR | Status: DC

## 2024-03-19 MED ORDER — LACTATED RINGERS IV BOLUS: 1000.0000 mL | INTRAVENOUS | Status: DC

## 2024-03-19 MED ORDER — METHIMAZOLE 5 MG PO TABS
5.0000 mg | ORAL_TABLET | Freq: Every day | ORAL | Status: DC
  Administered 2024-03-19: 5 mg via ORAL
  Filled 2024-03-19: qty 1

## 2024-03-19 MED ORDER — BENAZEPRIL HCL 20 MG PO TABS
20.0000 mg | ORAL_TABLET | Freq: Every day | ORAL | Status: DC
  Administered 2024-03-19 – 2024-03-20 (×2): 20 mg via ORAL
  Filled 2024-03-19 (×4): qty 1

## 2024-03-19 MED ORDER — DEXTROSE IN LACTATED RINGERS 5 % IV SOLN: INTRAVENOUS | Status: DC

## 2024-03-19 MED ORDER — PROCHLORPERAZINE EDISYLATE 10 MG/2ML IJ SOLN
10.0000 mg | Freq: Once | INTRAMUSCULAR | Status: AC
  Administered 2024-03-19: 10 mg via INTRAVENOUS
  Filled 2024-03-19: qty 2

## 2024-03-19 MED ORDER — FENTANYL CITRATE (PF) 50 MCG/ML IJ SOSY
12.5000 ug | PREFILLED_SYRINGE | Freq: Once | INTRAMUSCULAR | Status: AC
  Administered 2024-03-19: 12.5 ug via INTRAVENOUS
  Filled 2024-03-19: qty 1

## 2024-03-19 MED ORDER — AMLODIPINE BESYLATE 5 MG PO TABS
5.0000 mg | ORAL_TABLET | Freq: Every day | ORAL | Status: DC
  Administered 2024-03-20: 5 mg via ORAL
  Filled 2024-03-19 (×2): qty 1

## 2024-03-19 MED ORDER — LACTATED RINGERS IV BOLUS
1000.0000 mL | Freq: Once | INTRAVENOUS | Status: AC
  Administered 2024-03-19: 1000 mL via INTRAVENOUS

## 2024-03-19 MED ORDER — EZETIMIBE 10 MG PO TABS
10.0000 mg | ORAL_TABLET | Freq: Every day | ORAL | Status: DC
  Administered 2024-03-19 – 2024-03-21 (×3): 10 mg via ORAL
  Filled 2024-03-19 (×3): qty 1

## 2024-03-19 MED ORDER — INSULIN REGULAR(HUMAN) IN NACL 100-0.9 UT/100ML-% IV SOLN: INTRAVENOUS | Status: DC

## 2024-03-19 MED ORDER — METHIMAZOLE 5 MG PO TABS
40.0000 mg | ORAL_TABLET | Freq: Once | ORAL | Status: AC
  Administered 2024-03-19: 40 mg via ORAL
  Filled 2024-03-19: qty 8

## 2024-03-19 MED ORDER — POTASSIUM CHLORIDE 10 MEQ/100ML IV SOLN
10.0000 meq | INTRAVENOUS | Status: AC
  Administered 2024-03-19 (×2): 10 meq via INTRAVENOUS
  Filled 2024-03-19 (×2): qty 100

## 2024-03-19 MED ORDER — APIXABAN 5 MG PO TABS
5.0000 mg | ORAL_TABLET | Freq: Two times a day (BID) | ORAL | Status: DC
  Administered 2024-03-19 – 2024-03-20 (×3): 5 mg via ORAL
  Filled 2024-03-19 (×3): qty 1

## 2024-03-19 MED ORDER — METHIMAZOLE 5 MG PO TABS: 5.0000 mg | ORAL_TABLET | Freq: Every day | ORAL | Status: DC

## 2024-03-19 MED ORDER — FAMOTIDINE 20 MG PO TABS
20.0000 mg | ORAL_TABLET | Freq: Every day | ORAL | Status: DC
  Administered 2024-03-19 – 2024-03-20 (×2): 20 mg via ORAL
  Filled 2024-03-19 (×3): qty 1

## 2024-03-19 MED ORDER — FENTANYL CITRATE (PF) 50 MCG/ML IJ SOSY
12.5000 ug | PREFILLED_SYRINGE | INTRAMUSCULAR | Status: AC | PRN
Start: 2024-03-19 — End: 2024-03-19
  Administered 2024-03-19 (×3): 12.5 ug via INTRAVENOUS
  Filled 2024-03-19 (×3): qty 1

## 2024-03-19 MED ORDER — LACTATED RINGERS IV BOLUS: 500.0000 mL | Freq: Once | INTRAVENOUS | Status: DC

## 2024-03-19 MED ORDER — POTASSIUM CHLORIDE CRYS ER 20 MEQ PO TBCR: 40.0000 meq | EXTENDED_RELEASE_TABLET | Freq: Once | ORAL | Status: DC

## 2024-03-19 MED ORDER — DEXTROSE 50 % IV SOLN: 0.0000 mL | INTRAVENOUS | Status: DC | PRN

## 2024-03-19 MED ORDER — POTASSIUM CHLORIDE 10 MEQ/100ML IV SOLN
10.0000 meq | INTRAVENOUS | Status: AC
Start: 2024-03-19 — End: 2024-03-19
  Administered 2024-03-19 (×2): 10 meq via INTRAVENOUS
  Filled 2024-03-19 (×2): qty 100

## 2024-03-19 MED ORDER — METHIMAZOLE 5 MG PO TABS
5.0000 mg | ORAL_TABLET | Freq: Every day | ORAL | Status: DC
  Filled 2024-03-19: qty 1

## 2024-03-19 MED ORDER — PROPRANOLOL HCL 10 MG PO TABS
40.0000 mg | ORAL_TABLET | Freq: Three times a day (TID) | ORAL | Status: DC
  Administered 2024-03-19: 40 mg via ORAL
  Filled 2024-03-19 (×2): qty 4

## 2024-03-19 MED ORDER — LACTATED RINGERS IV SOLN: INTRAVENOUS | Status: DC

## 2024-03-19 MED ORDER — AMLODIPINE BESYLATE 5 MG PO TABS
2.5000 mg | ORAL_TABLET | Freq: Every day | ORAL | Status: DC
Start: 2024-03-19 — End: 2024-03-19
  Administered 2024-03-19: 2.5 mg via ORAL
  Filled 2024-03-19: qty 1

## 2024-03-19 MED ORDER — PROPRANOLOL HCL 10 MG PO TABS
20.0000 mg | ORAL_TABLET | Freq: Once | ORAL | Status: AC
  Administered 2024-03-19: 20 mg via ORAL

## 2024-03-19 MED ORDER — ASPIRIN 81 MG PO TBEC
81.0000 mg | DELAYED_RELEASE_TABLET | Freq: Every day | ORAL | Status: DC
  Administered 2024-03-19 – 2024-03-20 (×2): 81 mg via ORAL
  Filled 2024-03-19 (×3): qty 1

## 2024-03-19 MED ORDER — POTASSIUM CHLORIDE 10 MEQ/100ML IV SOLN: 10.0000 meq | INTRAVENOUS | Status: DC

## 2024-03-19 MED ORDER — DEXTROSE IN LACTATED RINGERS 5 % IV SOLN: INTRAVENOUS | Status: AC

## 2024-03-19 MED ORDER — INSULIN REGULAR(HUMAN) IN NACL 100-0.9 UT/100ML-% IV SOLN
INTRAVENOUS | Status: AC
  Administered 2024-03-19: 2.8 [IU]/h via INTRAVENOUS
  Administered 2024-03-19: 1.9 [IU]/h via INTRAVENOUS
  Filled 2024-03-19: qty 100

## 2024-03-19 MED ORDER — PROPYLTHIOURACIL 50 MG PO TABS
200.0000 mg | ORAL_TABLET | ORAL | Status: DC
  Filled 2024-03-19 (×2): qty 4

## 2024-03-19 MED ORDER — CHOLESTYRAMINE 4 G PO PACK
4.0000 g | PACK | Freq: Four times a day (QID) | ORAL | Status: DC
  Filled 2024-03-19 (×2): qty 1

## 2024-03-19 MED ORDER — FENTANYL CITRATE (PF) 50 MCG/ML IJ SOSY: 12.5000 ug | PREFILLED_SYRINGE | INTRAMUSCULAR | Status: DC | PRN

## 2024-03-19 MED ORDER — ONDANSETRON HCL 4 MG/2ML IJ SOLN
4.0000 mg | Freq: Three times a day (TID) | INTRAMUSCULAR | Status: DC | PRN
  Administered 2024-03-19 – 2024-03-20 (×2): 4 mg via INTRAVENOUS
  Filled 2024-03-19 (×2): qty 2

## 2024-03-19 MED ORDER — METOPROLOL SUCCINATE ER 50 MG PO TB24
50.0000 mg | ORAL_TABLET | Freq: Every day | ORAL | Status: DC
  Administered 2024-03-19: 50 mg via ORAL
  Filled 2024-03-19: qty 1

## 2024-03-19 MED ORDER — POTASSIUM CHLORIDE 10 MEQ/100ML IV SOLN
10.0000 meq | INTRAVENOUS | Status: AC
Start: 2024-03-19 — End: 2024-03-20
  Administered 2024-03-19 – 2024-03-20 (×2): 10 meq via INTRAVENOUS
  Filled 2024-03-19 (×2): qty 100

## 2024-03-19 MED ORDER — PROPRANOLOL HCL 10 MG PO TABS
60.0000 mg | ORAL_TABLET | Freq: Three times a day (TID) | ORAL | Status: DC
  Administered 2024-03-19: 60 mg via ORAL
  Filled 2024-03-19: qty 6

## 2024-03-19 MED ORDER — ROSUVASTATIN CALCIUM 20 MG PO TABS
20.0000 mg | ORAL_TABLET | Freq: Every day | ORAL | Status: DC
  Administered 2024-03-19 – 2024-03-21 (×3): 20 mg via ORAL
  Filled 2024-03-19 (×3): qty 1

## 2024-03-19 NOTE — ED Notes (Signed)
 PT spitting over rail of bed stating, my stomach is upset bad.

## 2024-03-19 NOTE — ED Notes (Signed)
 Pt stating Im wetting the bed, I'm peeing right now. Linens and gown changed, purewick applied, brief in place. Pt has been medicated per [facility] but continues to shout and spit on the floor, stating her abdominal pain is unbearable and she feels sick. Admitting MD aware and states he will be down to assess pt.

## 2024-03-19 NOTE — ED Notes (Addendum)
 Pt bedding changed due to patient urinating. Pt will not sit still so the purewick will not stay in place. During bedding being changed, pt began to vomit dark green liquid, moderate amount 4 times. Pt c/o abdominal pain. Posey alarm on bed

## 2024-03-19 NOTE — Consult Note (Signed)
 NAME:  Megan Fischer, MRN:  [id number], DOB:  [birth year], LOS: 0 ADMISSION DATE:  03/18/2024, CONSULTATION DATE:  03/19/24 REFERRING MD:  BERNIS, CHIEF COMPLAINT:  le edema and jvd   History of Present Illness:  73 yo female presented to [facility] with DKA, being treated via the protocol. No changes in her vital signs but per consult, it was noted that she had worsening LE edema, and jvd. There was concern for acute RHF and cardiology/vascular were consulted whom recommended ccm evaluation first.   At initial presentation pt was reportedly increasingly restless at night and having increased confusion as well as headaches. Her CTH revealed no acute process. 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 At that time pt also endorsed nausea and vomiting. No diarrhea, no cp, palpitations, no sob, no dizziness, recent sick contacts, syncope, falls. No fevers or chills but reportedly stating she is hot. No reported new focal neurologic deficits.   Pt's vital signs remain stable and within normal range. She has been on the DKA protocol with little improvement in her AG. Her lactate has indeed improved initially from 4 to2.4 but now to 3. Troponin at presentation was benign. No bnp obtained.   CCM was asked to eval for le edema and jvd as well as non-clearing lactate.   Upon my exam pt is alert and oriented to self and reason for presentation but thinks she is at home. She is in no acute distress, offers no complaints. States the last time she was nauseous or vomiting was prior to admission. No further issues reported.   Pertinent  Medical History  DM2 with hyperglycemia Chronic afib on chronic a/c Ckd4 H.o cva goiter  Significant Hospital Events: Including procedures, antibiotic start and stop dates in addition to other pertinent events   Admitted to [facility] 11/11 Ccm consulted 11/12  Interim History / Subjective:    Objective    Blood pressure (!)  132/90, pulse 85, temperature 97.9 F (36.6 C), temperature source Oral, resp. rate 18, height 5' 3 (1.6 m), weight 80.7 kg, SpO2 94%.       No intake or output data in the 24 hours ending 03/19/24 1739 Filed Weights   03/18/24 1417  Weight: 80.7 kg    Examination: General: nad reclining in bed comfortably but with safety mittens on  HENT: ncat eomi, perrla, mmmp Lungs: ctab Cardiovascular: rrr Abdomen: soft nt bs + Extremities: + LE edema Neuro: no focal deficits. Not oriented to place GU: deferred   Resolved problem list   Assessment and Plan  Dka Lactic acidosis, 2/2 above Lower extremity edema -agree with continuation of DKA protocol -cont with volume resuscitation, pt is on no supplemental oxygen -renal function adequate -recent echo 01/2024 without abnormality other than a degree of pah, which can reasonably be re-evaluated during this hospitalization -lactate will take time to clear -in an immobile pt for hospitalization receiving presumably large volume resuscitation for dka edema is not abnormal -certainly a limited echo for rv function is not out of reason -no indication for emergent cvc for coox in a pt without any hemodynamic instability -consider cta pulmonary if concern for sudden rv failure or troponin, bnp, venous duplex, etc should there be further concern or any hemodynamic instability -ccm will sign off but please call should her condition change   Labs   CBC: Recent Labs  Lab 03/18/24 1446 03/18/24 1453 03/19/24 0215 03/19/24 1529  WBC 4.8  --  6.8 6.6  NEUTROABS 3.1  --   --  5.4  HGB 14.0 14.6 12.4 14.6  HCT 43.8 43.0 38.5 42.9  MCV 88.3  --  87.9 84.4  PLT 188  --  172 195    Basic Metabolic Panel: Recent Labs  Lab 03/18/24 1446 03/18/24 1453 03/19/24 0006 03/19/24 0215 03/19/24 1042 03/19/24 1529  NA 140 143 139 140 136 136  K 3.8 3.6 3.5 3.7 3.4* 3.6  CL 106 109 102 101 94* 94*  CO2 20*  --  18* 19* 23 23  GLUCOSE 149* 144*  197* 151* 147* 142*  BUN 18 20 16 16 15 16   CREATININE 1.15* 1.20* 1.13* 1.10* 1.00 1.02*  CALCIUM  9.4  --  9.1 8.8* 9.1 9.2  MG 1.7  --   --   --   --   --    GFR: Estimated Creatinine Clearance: 49.4 mL/min (A) (by C-G formula based on SCr of 1.02 mg/dL (H)). Recent Labs  Lab 03/18/24 1446 03/18/24 2148 03/19/24 0006 03/19/24 0215 03/19/24 0833 03/19/24 1057 03/19/24 1529  WBC 4.8  --   --  6.8  --   --  6.6  LATICACIDVEN  --    < > 3.8*  --  2.4* 3.0* 3.0*   < > = values in this interval not displayed.    Liver Function Tests: Recent Labs  Lab 03/19/24 0006 03/19/24 0215  AST 23 28  ALT 14 15  ALKPHOS 103 104  BILITOT 0.9 1.2  PROT 7.5 7.7  ALBUMIN 3.6 3.6   Recent Labs  Lab 03/18/24 1446  LIPASE 28   No results for input(s): AMMONIA in the last 168 hours.  ABG    Component Value Date/Time   HCO3 23.9 03/19/2024 1055   TCO2 23 03/18/2024 1453   ACIDBASEDEF 0.1 03/19/2024 1055   O2SAT 91.2 03/19/2024 1055     Coagulation Profile: Recent Labs  Lab 03/18/24 1446  INR 1.0    Cardiac Enzymes: No results for input(s): CKTOTAL, CKMB, CKMBINDEX, TROPONINI in the last 168 hours.  HbA1C: Hgb A1c MFr Bld  Date/Time Value Ref Range Status  01/22/2024 09:15 AM 6.5 (H) 4.8 - 5.6 % Final    Comment:    (NOTE) Diagnosis of Diabetes The following HbA1c ranges recommended by the American Diabetes Association (ADA) may be used as an aid in the diagnosis of diabetes mellitus.  Hemoglobin             Suggested A1C NGSP%              Diagnosis  <5.7                   Non Diabetic  5.7-6.4                Pre-Diabetic  >6.4                   Diabetic  <7.0                   Glycemic control for                       adults with diabetes.    10/12/2023 08:59 AM 7.8 (H) <5.7 % Final    Comment:    For someone without known diabetes, a hemoglobin A1c value of 6.5% or greater indicates that they may have  diabetes and this should be confirmed  with a follow-up  test. . For someone with known diabetes, a value <7% indicates  that their diabetes is well controlled and a value  greater than or equal to 7% indicates suboptimal  control. A1c targets should be individualized based on  duration of diabetes, age, comorbid conditions, and  other considerations. . Currently, no consensus exists regarding use of hemoglobin A1c for diagnosis of diabetes for children. .     CBG: Recent Labs  Lab 03/19/24 1147 03/19/24 1229 03/19/24 1400 03/19/24 1530 03/19/24 1631  GLUCAP 170* 141* 130* 151* 135*    Review of Systems:   Per HPI  Past Medical History:  She,  has a past medical history of Atrial fibrillation (HCC), Chronic disease anemia (03/29/2011), Diabetes mellitus, Dyslipidemia, Hypertension, Obesities, morbid (HCC), and Osteoarthritis.   Surgical History:   Past Surgical History:  Procedure Laterality Date   CHOLECYSTECTOMY     REPLACEMENT TOTAL KNEE BILATERAL     SHOULDER SURGERY     RIGHT SHOULDER   TUBAL LIGATION       Social History:   reports that she has never smoked. She has never used smokeless tobacco. She reports that she does not drink alcohol and does not use drugs.   Family History:  Her family history includes Diabetes in her mother; Heart attack in her mother; Hypertension in her brother; Stroke in her mother. There is no history of Breast cancer.   Allergies Allergies  Allergen Reactions   Aspirin  Nausea Only    Patient stated to neuro that it caused an upset stomach   Codeine Other (See Comments)    Unknown    Fentanyl  Other (See Comments)    hypoxia   Oxycodone Hcl Other (See Comments)    Unknown    Penicillins Other (See Comments)    Unknown    Chlorhexidine Gluconate Rash     Home Medications  Prior to Admission medications   Medication Sig Start Date End Date Taking? Authorizing Provider  acetaminophen  (TYLENOL ) 325 MG tablet Take 1-2 tablets (325-650 mg total) by mouth every  4 (four) hours as needed for mild pain (pain score 1-3). 01/31/24  Yes Love, Sharlet RAMAN, PA-C  amLODipine  (NORVASC ) 2.5 MG tablet Take 1 tablet (2.5 mg total) by mouth daily. 02/28/24  Yes Amilibia, Jaden, DO  apixaban  (ELIQUIS ) 5 MG TABS tablet Take 1 tablet (5 mg total) by mouth 2 (two) times daily. 02/28/24  Yes Amilibia, Jaden, DO  aspirin  EC 81 MG tablet Take 1 tablet (81 mg total) by mouth daily. Swallow whole. 02/28/24  Yes Amilibia, Jaden, DO  benazepril  (LOTENSIN ) 20 MG tablet TAKE 1 TABLET BY MOUTH DAILY 02/26/24  Yes Azadegan, Maryam, MD  CALCIUM  PO Take 1 tablet by mouth 2 (two) times daily.   Yes [provider]  camphor-menthol  VIKKI) lotion Apply topically as needed for itching. 02/28/24  Yes Amilibia, Jaden, DO  celecoxib (CELEBREX) 200 MG capsule Take 1 capsule (200 mg total) by mouth daily as needed for moderate pain (pain score 4-6). 02/28/24 03/29/24 Yes Amilibia, Jaden, DO  diclofenac  Sodium (VOLTAREN ) 1 % GEL Apply 2 g topically 4 (four) times daily. To both knees 02/28/24  Yes Amilibia, Jaden, DO  DULoxetine  (CYMBALTA ) 60 MG capsule Take 60 mg by mouth daily. 02/15/24  Yes [provider]  ezetimibe  (ZETIA ) 10 MG tablet Take 1 tablet (10 mg total) by mouth daily. 12/18/23 03/18/24 Yes Ladona Heinz, MD  famotidine  (PEPCID ) 20 MG tablet Take 1 tablet (20 mg total) by mouth daily. 02/14/24  Yes Love, Sharlet RAMAN, PA-C  gabapentin  (NEURONTIN ) 100 MG capsule Take 1 capsule (100 mg total) by mouth 3 (three) times daily. Patient taking differently: Take 300 mg by mouth See admin instructions. 1-2 times daily only 02/28/24  Yes Amilibia, Jaden, DO  glimepiride  (AMARYL ) 1 MG tablet Take 1 tablet (1 mg total) by mouth daily with breakfast. 10/18/23  Yes Thapa, Sudan, MD  JARDIANCE  25 MG TABS tablet Take 25 mg by mouth daily. 02/26/24  Yes [provider]  lidocaine (LIDODERM) 5 % Place 2 patches onto the skin daily. Remove & Discard patch within 12 hours or as directed  by MD 02/14/24  Yes Love, Sharlet RAMAN, PA-C  methimazole  (TAPAZOLE ) 5 MG tablet TAKE 1 TABLET BY MOUTH DAILY 02/20/24  Yes Thapa, Sudan, MD  methocarbamol  (ROBAXIN ) 500 MG tablet Take 1 tablet (500 mg total) by mouth every 6 (six) hours as needed for muscle spasms. 02/14/24  Yes Love, Sharlet RAMAN, PA-C  metoprolol  succinate (TOPROL -XL) 50 MG 24 hr tablet TAKE 1 TABLET BY MOUTH DAILY  WITH OR IMMEDIATELY FOLLOWING A  MEAL 09/20/23  Yes Trudy Mliss Dragon, MD  Multiple Vitamins-Minerals (MULTIVITAMIN WITH MINERALS) tablet Take 1 tablet by mouth daily.   Yes [provider]  rosuvastatin  (CRESTOR ) 20 MG tablet Take 1 tablet (20 mg total) by mouth daily after supper. 02/28/24  Yes Amilibia, Jaden, DO  Semaglutide , 2 MG/DOSE, (OZEMPIC , 2 MG/DOSE,) 8 MG/3ML SOPN INJECT SUBCUTANEOUSLY 2 MG EVERY WEEK 06/22/23  Yes Thapa, Sudan, MD  Blood Glucose Monitoring Suppl DEVI 1 each by Does not apply route as directed. Dispense based on patient and insurance preference. Use up to four times daily as directed. (FOR ICD-10 E10.9, E11.9). 02/28/24   Amilibia, Jaden, DO  ferrous sulfate  325 (65 FE) MG EC tablet Take 1 tablet (325 mg total) by mouth 2 (two) times daily. Patient not taking: Reported on 03/18/2024 09/27/14   Lanny Callander, MD  Glucose Blood (BLOOD GLUCOSE TEST STRIPS) STRP 1 each by Does not apply route as directed. Dispense based on patient and insurance preference. Use up to four times daily as directed. (FOR ICD-10 E10.9, E11.9). 02/28/24   Amilibia, Jaden, DO  Lancet Device MISC 1 each by Does not apply route as directed. Dispense based on patient and insurance preference. Use up to four times daily as directed. (FOR ICD-10 E10.9, E11.9). 02/28/24   Amilibia, Jaden, DO  Lancets MISC 1 each by Does not apply route as directed. Dispense based on patient and insurance preference. Use up to four times daily as directed. (FOR ICD-10 E10.9, E11.9). 02/28/24   Amilibia, Jaden, DO      care time: 

## 2024-03-19 NOTE — Progress Notes (Signed)
 PICC order received. Contacted daughter, Mylinda re: consent. She is requesting an update, stated she doesn't know why her mom is unable to consent for herself. Mylinda stated she is on the way to hospital to see pt. Covering providers notified.

## 2024-03-19 NOTE — ED Notes (Addendum)
 Pt continues to cry out for Junior and in pain

## 2024-03-19 NOTE — Progress Notes (Signed)
 Arrived to discuss PICC placement with daughter and patient including risks, benefits, and alternatives. 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 They requested speaking to primary RN, MD, and to wait on their brother to arrive before deciding on any procedure.

## 2024-03-19 NOTE — ED Notes (Addendum)
 Pt bedding changed after she urinated. Pt becomes extremely nauseous with movement to change bedding and hangs her head off the side dry heaving. Pt c/o being hot and has torn off gown and will not allow it back on. Pt then starts to scream for Junior and says I'm trying to get comfortable as she rolls around on stretcher. Pt continues to pull on monitoring cords and pull at brief. Pt then states she has to pee and she does and bedding changed again. Posey alarm on bed

## 2024-03-19 NOTE — ED Notes (Signed)
 Pt bedding/gown changed

## 2024-03-19 NOTE — ED Notes (Signed)
 Pt removed from SpO2 monitoring per IP MD, remains on cardiac monitoring at this time

## 2024-03-19 NOTE — ED Notes (Signed)
 Main pharmacy messaged stating they will send lotensin  shortly

## 2024-03-19 NOTE — ED Notes (Addendum)
 MD McLenon at bedside

## 2024-03-19 NOTE — Progress Notes (Signed)
  Echocardiogram 2D Echocardiogram has been performed.  Megan Fischer 03/19/2024, 6:01 PM

## 2024-03-19 NOTE — ED Notes (Addendum)
 Pt received from previous RN. Pt placed on cardiac monitoring, IVF infusing, pt continuously rolling back and forth in the stretcher, dry heaving over side rail of stretcher then asking for soda. Pt reporting abdominal pain and HA, unable to quantify pain. Pt then sleeping between questions. Pt intermittently trying to pull off monitor leads and IV lines, redirectable. Pt able to use the bed pan at this time. Pt then noted to be trying to get OOB at end of stretcher. Pt redirected back into center of stretcher, repositioned in bed, stretcher tilted, safety sitter remains at bedside. Lights dimmed to promote rest, MD messaged for PRN medications

## 2024-03-19 NOTE — ED Notes (Signed)
 Pt back to hollering/moaning in pain and shouting for Abby then saying, Abby, my stomach hurts. Pt alert to name and place when asked. When asked who Abby is, pt states she does not know.

## 2024-03-19 NOTE — Progress Notes (Signed)
 [facility]   Bed Availability Yes  Level of Care Needed:  Yes  MD Agree to transfer: PT IMTS  Patient agree to transfer:   Sharolyn Batman, RN [facility]

## 2024-03-19 NOTE — Progress Notes (Signed)
 HD#0 SUBJECTIVE:  Patient Summary: Megan Fischer is a 73 y.o. with a pertinent PMH of DM2, Afib (on eliquis ), CKD4, CVA (01/2024), and past multinodular goiter, who presented with headache and agitation and admitted for thyrotoxicosis.   Interim History:  11/12: Pt was resting comfortably in bed when we saw her this morning on rounds. She had disrobed and was under her sheets. Was calm and cooperative, though with some confusion. Has been expressing concerns for nausea. Was seen again in the afternoon, and appeared to be more clear minded at that time.  OBJECTIVE:  Vital Signs: Vitals:   03/19/24 1124 03/19/24 1257 03/19/24 1357 03/19/24 1545  BP: (!) 181/108 (!) 170/124 (!) 140/109 (!) 132/90  Pulse: 85 99 81 85  Resp: 18 18 19 18   Temp: 99.7 F (37.6 C) 98.6 F (37 C) 98.3 F (36.8 C) 97.9 F (36.6 C)  TempSrc: Oral Oral Oral Oral  SpO2:  99% 97% 94%  Weight:      Height:       SpO2: 94 %  Filed Weights   03/18/24 1417  Weight: 80.7 kg   No intake or output data in the 24 hours ending 03/19/24 1657 Net IO Since Admission: No IO data has been entered for this period [03/19/24 1657]  Physical Exam: Physical Exam Vitals and nursing note reviewed. Exam conducted with a chaperone present.  Constitutional:      General: She is not in acute distress.    Appearance: She is not toxic-appearing.  Neck:     Comments: Goiter palpable on R side of neck Cardiovascular:     Rate and Rhythm: Normal rate and regular rhythm.  Pulmonary:     Effort: Pulmonary effort is normal. No respiratory distress.  Abdominal:     Palpations: Abdomen is soft.     Tenderness: There is no abdominal tenderness.  Skin:    General: Skin is warm and dry.  Neurological:     Mental Status: She is alert and oriented to person, place, and time.  Psychiatric:        Attention and Perception: She is inattentive.        Mood and Affect: Affect is blunt.     Comments: Failed ICU-CAM for delirium. A&Ox3     Patient Lines/Drains/Airways Status     Active Line/Drains/Airways     Name Placement date Placement time Site Days   Peripheral IV 03/18/24 20 G Left Antecubital 03/18/24  2121  Antecubital  1            Pertinent labs and imaging:      Latest Ref Rng & Units 03/19/2024    3:29 PM 03/19/2024    2:15 AM 03/18/2024    2:53 PM  CBC  WBC 4.0 - 10.5 K/uL 6.6  6.8    Hemoglobin 12.0 - 15.0 g/dL 85.3  87.5  85.3   Hematocrit 36.0 - 46.0 % 42.9  38.5  43.0   Platelets 150 - 400 K/uL 195  172         Latest Ref Rng & Units 03/19/2024   10:42 AM 03/19/2024    2:15 AM 03/19/2024   12:06 AM  CMP  Glucose 70 - 99 mg/dL 852  848  802   BUN 8 - 23 mg/dL 15  16  16    Creatinine 0.44 - 1.00 mg/dL 8.99  8.89  8.86   Sodium 135 - 145 mmol/L 136  140  139   Potassium 3.5 -  5.1 mmol/L 3.4  3.7  3.5   Chloride 98 - 111 mmol/L 94  101  102   CO2 22 - 32 mmol/L 23  19  18    Calcium  8.9 - 10.3 mg/dL 9.1  8.8  9.1   Total Protein 6.5 - 8.1 g/dL  7.7  7.5   Total Bilirubin 0.0 - 1.2 mg/dL  1.2  0.9   Alkaline Phos 38 - 126 U/L  104  103   AST 15 - 41 U/L  28  23   ALT 0 - 44 U/L  15  14     US  EKG SITE RITE Result Date: 03/19/2024 If Site Rite image not attached, placement could not be confirmed due to current cardiac rhythm.  DG CHEST PORT 1 VIEW Result Date: 03/18/2024 EXAM: 1 VIEW(S) XRAY OF THE CHEST 03/18/2024 11:28:44 PM COMPARISON: 02/18/2024 CLINICAL HISTORY: Chest pain. FINDINGS: LUNGS AND PLEURA: Mild eventration of the hemidiaphragm. No focal pulmonary opacity. No pulmonary edema. No pleural effusion. No pneumothorax. HEART AND MEDIASTINUM: No acute abnormality of the cardiac and mediastinal silhouettes. BONES AND SOFT TISSUES: Postsurgical changes involving the right shoulder. No acute osseous abnormality. IMPRESSION: 1. No acute cardiopulmonary findings. Electronically signed by: Pinkie Pebbles MD 03/18/2024 11:34 PM EST RP Workstation: [id number]   CT ABDOMEN  PELVIS W CONTRAST Result Date: 03/18/2024 CLINICAL DATA:  Acute abdominal pain EXAM: CT ABDOMEN AND PELVIS WITH CONTRAST TECHNIQUE: Multidetector CT imaging of the abdomen and pelvis was performed using the standard protocol following bolus administration of intravenous contrast. RADIATION DOSE REDUCTION: This exam was performed according to the departmental dose-optimization program which includes automated exposure control, adjustment of the mA and/or kV according to patient size and/or use of iterative reconstruction technique. CONTRAST:  75mL OMNIPAQUE  IOHEXOL  350 MG/ML SOLN COMPARISON:  02/18/2024 FINDINGS: Lower chest: No acute pleural or parenchymal lung disease. Hepatobiliary: No focal liver abnormality is seen. Status post cholecystectomy. No biliary dilatation. Pancreas: Unremarkable. No pancreatic ductal dilatation or surrounding inflammatory changes. Spleen: Normal in size without focal abnormality. Adrenals/Urinary Tract: Heterogeneous areas of decreased cortical enhancement are seen within the dorsal aspect of the left kidney, which could reflect acute pyelonephritis. Please correlate with urinalysis. There are other scattered areas of cortical scarring within the bilateral kidneys. Small simple appearing renal cortical cysts do not require specific imaging follow-up. The adrenals and bladder are unremarkable. Stomach/Bowel: No bowel obstruction or ileus. No bowel wall thickening or inflammatory change. Small hiatal hernia. Vascular/Lymphatic: Aortic atherosclerosis. No enlarged abdominal or pelvic lymph nodes. Reproductive: Uterus and bilateral adnexa are unremarkable. Other: No free fluid or free intraperitoneal gas. No abdominal wall hernia. Musculoskeletal: No acute or destructive bony abnormalities. Severe spondylosis at the L3-4 level. Reconstructed images demonstrate no additional findings. IMPRESSION: 1. Heterogeneous areas of decreased cortical enhancement within the left kidney, suspicious  for pyelonephritis. Please correlate with urinalysis. 2. Otherwise no acute intra-abdominal or intrapelvic process. 3. Small hiatal hernia. 4.  Aortic Atherosclerosis (ICD10-I70.0). Electronically Signed   By: Ozell Daring M.D.   On: 03/18/2024 18:58   CT Head Wo Contrast Result Date: 03/18/2024 CLINICAL DATA:  Altered level of consciousness EXAM: CT HEAD WITHOUT CONTRAST TECHNIQUE: Contiguous axial images were obtained from the base of the skull through the vertex without intravenous contrast. RADIATION DOSE REDUCTION: This exam was performed according to the departmental dose-optimization program which includes automated exposure control, adjustment of the mA and/or kV according to patient size and/or use of iterative reconstruction technique. COMPARISON:  03/18/2024 at 4 p.m.  FINDINGS: Brain: Stable chronic right PCA territory infarct with developing encephalomalacia. No evidence of acute infarct or hemorrhage. Lateral ventricles and midline structures are stable. No acute extra-axial fluid collections. No mass effect. Vascular: No hyperdense vessel or unexpected calcification. Skull: Normal. Negative for fracture or focal lesion. Sinuses/Orbits: No acute finding. Other: None. IMPRESSION: 1. Stable sequela of chronic right PCA territory infarct. 2. No acute intracranial process. Electronically Signed   By: Ozell Daring M.D.   On: 03/18/2024 18:54    ASSESSMENT/PLAN:  Assessment: Principal Problem:   Euglycemic DKA (diabetic ketoacidosis) (HCC) Active Problems:   Atrial fibrillation (HCC)   Hypertension   DM (diabetes mellitus) type II controlled with renal manifestation (HCC)   CKD (chronic kidney disease) stage 3, GFR 30-59 ml/min (HCC)   Abdominal pain, epigastric   Hemiplegia and hemiparesis following cerebral infarction affecting left non-dominant side (HCC)   Headache   Vomiting   Hyperthyroidism   Delirium   High anion gap metabolic acidosis   Lactic acidosis  Megan Fischer is  a 73 year old woman with a history of hypertension, type 2 diabetes, atrial fibrillation on apixaban , chronic kidney disease, and prior right PCA stroke who presented with worsening left-sided headache, restlessness, and confusion. She was admitted for evaluation and management of acute encephalopathy and refractory headache on hospital day 0.   Euglycemic DKA is the most likely primary concern for this patient. The elevated thyroid  may be contributing as a secondary concern.  Plan: # Acute encephalopathy / Confusion # Delirium # Euglycemic DKA #Lactic Acidosis  Patient with recent right PCA stroke now presenting with three days of worsening confusion and agitation, especially at night.  Workup: CT head without acute change. UA showed >500 glucosuria, Beta-hydroxybutrtic Acid 2.59, Lactic Acidosis, high anion gap, low bicarb on admission, and is on jardiance  for her Diabetes. WBC normal, and she is afebrile. Lactic acid 3.9 on admission. Bicarb 23.9. Her son reports waxing and waning mentation and increased nighttime wakefulness. No focal deficits.  Plan: -Endotool until Gap closes 2x, on LR and D5LR if glucose is less than 250 - Stop sedating meds - Delirium precautions - IV hydration and electrolyte correction as needed - Check TSH, repeat BMP. - Continue propranolol 40 mg QID for thyroid -related agitation and autonomic symptoms - Consider MRI for new infarction - Q4BMPs - Am T4  -Salicylate level  -Check CTA chest and abdomen if patient continues to decline and/or if lactic acid worsens   # Hyperthyroidism on Multinodular goiter (new diagnosis) TSH <0.1  and free T4 1.96. Less likely thyroid  storm given the normothermia and stable vital signs.  Plan: - Stop propranolol 20 mg PO TID - Continue Methimazole  5 mg daily - Continue to monitor vitals and heart rate - Check free T3 and thyroid  ultrasound -Am T4  # Refractory unilateral headache (status migrainosus vs post-stroke  pain) Persistent sharp left retro-orbital headache for 3,4 days associated with nausea and vomiting started at hospital, unresponsive to multiple migraine medications (metoclopramide, prochlorperazine , magnesium, valproate, droperidol, diphenhydramine ). Head CT unchanged; no new infarct or bleed. No meningismus or visual changes. Presentation is consistent with status migraine, though post-stroke pain may contribute. Triptans and ergots are contraindicated given her stroke history, and further opioids or sedatives would worsen delirium. Plan: - Give dexamethasone 10 mg IV once  - Acetaminophen  PRN; avoid opioids and benzodiazepines. - consider chlorpromazine 25 mg IV - Neurology follow-up for long-term migraine vs post-stroke pain management.   # Nausea, vomiting, and abdominal discomfort Symptoms  began after admission. CT abdomen and pelvis showed no acute process. Abdomen is soft and mildly tender. She is afebrile with normal lipase. Ddx: Thyrotoxicosis, medication effects from antiemetics, valproate, and fentanyl , migraine-related nausea, and mild gastritis or ileus from poor intake and stress. There is no evidence of obstruction, pancreatitis, or infection. Plan: - Give ondansetron 4 mg PO as needed - Avoid opioids and dopamine-blocking antiemetics - consider CTA to rule out mesenteric ischemia  # Chronic health conditions Continuing the following home medications - Amlodipine  5mg  - Benazepril  20mg  daily - Zetia  10mg  daily - Eliquis  5mg  BID (A.Fib)  Best Practice: Diet: NPO VTE: Eliquis  5mg  BID (A. Fib) Code: DNR/DNI  Disposition planning: DISPO: Anticipated discharge in 5-7 days to pending clinical improvement.  Penne Mori DO - PGY1 [facility]  4:57 PM, 03/19/2024  On Call pager [phone]

## 2024-03-19 NOTE — Progress Notes (Signed)
 Pt is confused and unable to follow instructions. 1 attempt made for USGPIV with holding assistance from floor staff. Pt continuing to struggle against staff despite holding assistance and redirection techniques. Unit RN notified.

## 2024-03-19 NOTE — Inpatient Diabetes Management (Signed)
 Inpatient Diabetes Program Recommendations  AACE/ADA: New Consensus Statement on Inpatient Glycemic Control   Target Ranges:  Prepandial:   less than 140 mg/dL      Peak postprandial:   less than 180 mg/dL (1-2 hours)      Critically ill patients:  140 - 180 mg/dL   Lab Results  Component Value Date   GLUCAP 130 (H) 03/19/2024   HGBA1C 6.5 (H) 01/22/2024    Latest Reference Range & Units 03/19/24 11:47 03/19/24 12:29 03/19/24 14:00  Glucose-Capillary 70 - 99 mg/dL 829 (H) 858 (H) 869 (H)    Latest Reference Range & Units 03/18/24 14:46 03/19/24 00:06 03/19/24 02:15 03/19/24 10:42  CO2 22 - 32 mmol/L 20 (L) 18 (L) 19 (L) 23  Glucose 70 - 99 mg/dL 850 (H) 802 (H) 848 (H) 147 (H)  BUN 8 - 23 mg/dL 18 16 16 15   Creatinine 0.44 - 1.00 mg/dL 8.84 (H) 8.86 (H) 8.89 (H) 1.00  Calcium  8.9 - 10.3 mg/dL 9.4 9.1 8.8 (L) 9.1  Anion gap 5 - 15  14 19  (H) 20 (H) 19 (H)    Latest Reference Range & Units Most Recent  Lactic Acid, Venous 0.5 - 1.9 mmol/L 3.0 (HH) 03/19/24 10:57    Latest Reference Range & Units Most Recent  Beta-Hydroxybutyric Acid 0.05 - 0.27 mmol/L 2.59 (H) 03/19/24 10:42   Review of Glycemic Control  Diabetes history: DM2  Outpatient Diabetes medications:  Amaryl  1mg  daily  Jardiance  25mg  daily  Ozempic  2mg  weekly   Current orders for Inpatient glycemic control:  IV Insulin    Inpatient Diabetes Program Recommendations:   Recommend continuing IV Insulin  until acidosis has resolved.   Noted patient takes Jardiance  25mg  daily at home which can increase risk for Euglycemia DKA, especially with poor intake.   Will follow.   Thanks,  Lavanda Search, RN, MSN, [facility]  Inpatient Diabetes Coordinator  Pager [phone] (8a-5p)

## 2024-03-20 ENCOUNTER — Inpatient Hospital Stay (HOSPITAL_COMMUNITY)

## 2024-03-20 DIAGNOSIS — E1110 Type 2 diabetes mellitus with ketoacidosis without coma: Secondary | ICD-10-CM | POA: Diagnosis not present

## 2024-03-20 DIAGNOSIS — Z8673 Personal history of transient ischemic attack (TIA), and cerebral infarction without residual deficits: Secondary | ICD-10-CM

## 2024-03-20 DIAGNOSIS — I2721 Secondary pulmonary arterial hypertension: Secondary | ICD-10-CM | POA: Diagnosis not present

## 2024-03-20 DIAGNOSIS — E0590 Thyrotoxicosis, unspecified without thyrotoxic crisis or storm: Secondary | ICD-10-CM | POA: Diagnosis not present

## 2024-03-20 DIAGNOSIS — I4821 Permanent atrial fibrillation: Secondary | ICD-10-CM

## 2024-03-20 DIAGNOSIS — Z7984 Long term (current) use of oral hypoglycemic drugs: Secondary | ICD-10-CM | POA: Diagnosis not present

## 2024-03-20 DIAGNOSIS — I519 Heart disease, unspecified: Secondary | ICD-10-CM

## 2024-03-20 LAB — BASIC METABOLIC PANEL WITH GFR
Anion gap: 15 (ref 5–15)
Anion gap: 17 — ABNORMAL HIGH (ref 5–15)
Anion gap: 17 — ABNORMAL HIGH (ref 5–15)
Anion gap: 17 — ABNORMAL HIGH (ref 5–15)
Anion gap: 14 (ref 5–15)
Anion gap: 10 (ref 5–15)
BUN: 15 mg/dL (ref 8–23)
BUN: 19 mg/dL (ref 8–23)
BUN: 20 mg/dL (ref 8–23)
BUN: 20 mg/dL (ref 8–23)
BUN: 18 mg/dL (ref 8–23)
BUN: 16 mg/dL (ref 8–23)
CO2: 26 mmol/L (ref 22–32)
CO2: 22 mmol/L (ref 22–32)
CO2: 20 mmol/L — ABNORMAL LOW (ref 22–32)
CO2: 20 mmol/L — ABNORMAL LOW (ref 22–32)
CO2: 23 mmol/L (ref 22–32)
CO2: 27 mmol/L (ref 22–32)
Calcium: 8.6 mg/dL — ABNORMAL LOW (ref 8.9–10.3)
Calcium: 8.6 mg/dL — ABNORMAL LOW (ref 8.9–10.3)
Calcium: 9.1 mg/dL (ref 8.9–10.3)
Calcium: 8.7 mg/dL — ABNORMAL LOW (ref 8.9–10.3)
Calcium: 8.8 mg/dL — ABNORMAL LOW (ref 8.9–10.3)
Calcium: 8.3 mg/dL — ABNORMAL LOW (ref 8.9–10.3)
Chloride: 92 mmol/L — ABNORMAL LOW (ref 98–111)
Chloride: 91 mmol/L — ABNORMAL LOW (ref 98–111)
Chloride: 96 mmol/L — ABNORMAL LOW (ref 98–111)
Chloride: 95 mmol/L — ABNORMAL LOW (ref 98–111)
Chloride: 93 mmol/L — ABNORMAL LOW (ref 98–111)
Chloride: 93 mmol/L — ABNORMAL LOW (ref 98–111)
Creatinine, Ser: 1.02 mg/dL — ABNORMAL HIGH (ref 0.44–1.00)
Creatinine, Ser: 1.08 mg/dL — ABNORMAL HIGH (ref 0.44–1.00)
Creatinine, Ser: 1.15 mg/dL — ABNORMAL HIGH (ref 0.44–1.00)
Creatinine, Ser: 1.15 mg/dL — ABNORMAL HIGH (ref 0.44–1.00)
Creatinine, Ser: 1.14 mg/dL — ABNORMAL HIGH (ref 0.44–1.00)
Creatinine, Ser: 1.12 mg/dL — ABNORMAL HIGH (ref 0.44–1.00)
GFR, Estimated: 58 mL/min — ABNORMAL LOW (ref >60)
GFR, Estimated: 54 mL/min — ABNORMAL LOW (ref >60)
GFR, Estimated: 50 mL/min — ABNORMAL LOW (ref >60)
GFR, Estimated: 50 mL/min — ABNORMAL LOW (ref >60)
GFR, Estimated: 51 mL/min — ABNORMAL LOW (ref >60)
GFR, Estimated: 52 mL/min — ABNORMAL LOW (ref >60)
Glucose, Bld: 116 mg/dL — ABNORMAL HIGH (ref 70–99)
Glucose, Bld: 133 mg/dL — ABNORMAL HIGH (ref 70–99)
Glucose, Bld: 163 mg/dL — ABNORMAL HIGH (ref 70–99)
Glucose, Bld: 160 mg/dL — ABNORMAL HIGH (ref 70–99)
Glucose, Bld: 170 mg/dL — ABNORMAL HIGH (ref 70–99)
Glucose, Bld: 155 mg/dL — ABNORMAL HIGH (ref 70–99)
Potassium: 3.3 mmol/L — ABNORMAL LOW (ref 3.5–5.1)
Potassium: 3.3 mmol/L — ABNORMAL LOW (ref 3.5–5.1)
Potassium: 4.2 mmol/L (ref 3.5–5.1)
Potassium: 4.1 mmol/L (ref 3.5–5.1)
Potassium: 3.5 mmol/L (ref 3.5–5.1)
Potassium: 3 mmol/L — ABNORMAL LOW (ref 3.5–5.1)
Sodium: 133 mmol/L — ABNORMAL LOW (ref 135–145)
Sodium: 130 mmol/L — ABNORMAL LOW (ref 135–145)
Sodium: 133 mmol/L — ABNORMAL LOW (ref 135–145)
Sodium: 132 mmol/L — ABNORMAL LOW (ref 135–145)
Sodium: 130 mmol/L — ABNORMAL LOW (ref 135–145)
Sodium: 130 mmol/L — ABNORMAL LOW (ref 135–145)

## 2024-03-20 LAB — GLUCOSE, CAPILLARY
Glucose-Capillary: 101 mg/dL — ABNORMAL HIGH (ref 70–99)
Glucose-Capillary: 102 mg/dL — ABNORMAL HIGH (ref 70–99)
Glucose-Capillary: 113 mg/dL — ABNORMAL HIGH (ref 70–99)
Glucose-Capillary: 117 mg/dL — ABNORMAL HIGH (ref 70–99)
Glucose-Capillary: 124 mg/dL — ABNORMAL HIGH (ref 70–99)
Glucose-Capillary: 129 mg/dL — ABNORMAL HIGH (ref 70–99)
Glucose-Capillary: 131 mg/dL — ABNORMAL HIGH (ref 70–99)
Glucose-Capillary: 141 mg/dL — ABNORMAL HIGH (ref 70–99)
Glucose-Capillary: 142 mg/dL — ABNORMAL HIGH (ref 70–99)
Glucose-Capillary: 144 mg/dL — ABNORMAL HIGH (ref 70–99)
Glucose-Capillary: 150 mg/dL — ABNORMAL HIGH (ref 70–99)
Glucose-Capillary: 157 mg/dL — ABNORMAL HIGH (ref 70–99)
Glucose-Capillary: 159 mg/dL — ABNORMAL HIGH (ref 70–99)
Glucose-Capillary: 162 mg/dL — ABNORMAL HIGH (ref 70–99)
Glucose-Capillary: 165 mg/dL — ABNORMAL HIGH (ref 70–99)
Glucose-Capillary: 166 mg/dL — ABNORMAL HIGH (ref 70–99)
Glucose-Capillary: 166 mg/dL — ABNORMAL HIGH (ref 70–99)
Glucose-Capillary: 187 mg/dL — ABNORMAL HIGH (ref 70–99)

## 2024-03-20 LAB — HEPATIC FUNCTION PANEL
ALT: 21 U/L (ref 0–44)
AST: 40 U/L (ref 15–41)
Albumin: 3.3 g/dL — ABNORMAL LOW (ref 3.5–5.0)
Alkaline Phosphatase: 115 U/L (ref 38–126)
Bilirubin, Direct: 0.3 mg/dL — ABNORMAL HIGH (ref 0.0–0.2)
Indirect Bilirubin: 0.9 mg/dL (ref 0.3–0.9)
Total Bilirubin: 1.2 mg/dL (ref 0.0–1.2)
Total Protein: 8.1 g/dL (ref 6.5–8.1)

## 2024-03-20 LAB — MAGNESIUM: Magnesium: 1.4 mg/dL — ABNORMAL LOW (ref 1.7–2.4)

## 2024-03-20 LAB — COOXEMETRY PANEL
Carboxyhemoglobin: 1.6 % — ABNORMAL HIGH (ref 0.5–1.5)
Methemoglobin: <0.7 % (ref 0.0–1.5)
O2 Saturation: 64.1 %
Total hemoglobin: 13.3 g/dL (ref 12.0–16.0)

## 2024-03-20 LAB — CBC
HCT: 45.1 % (ref 36.0–46.0)
Hemoglobin: 15.3 g/dL — ABNORMAL HIGH (ref 12.0–15.0)
MCH: 28.4 pg (ref 26.0–34.0)
MCHC: 33.9 g/dL (ref 30.0–36.0)
MCV: 83.7 fL (ref 80.0–100.0)
Platelets: 225 K/uL (ref 150–400)
RBC: 5.39 MIL/uL — ABNORMAL HIGH (ref 3.87–5.11)
RDW: 14.3 % (ref 11.5–15.5)
WBC: 9.8 K/uL (ref 4.0–10.5)
nRBC: 0 % (ref 0.0–0.2)

## 2024-03-20 LAB — LACTIC ACID, PLASMA
Lactic Acid, Venous: 3.4 mmol/L (ref 0.5–1.9)
Lactic Acid, Venous: 3.6 mmol/L (ref 0.5–1.9)
Lactic Acid, Venous: 2.6 mmol/L (ref 0.5–1.9)
Lactic Acid, Venous: 2.4 mmol/L (ref 0.5–1.9)
Lactic Acid, Venous: 2.7 mmol/L (ref 0.5–1.9)

## 2024-03-20 LAB — THYROTROPIN RECEPTOR AUTOABS: Thyrotropin Receptor Ab: <1.1 IU/L (ref 0.00–1.75)

## 2024-03-20 LAB — T4, FREE
Free T4: 1.56 ng/dL — ABNORMAL HIGH (ref 0.61–1.12)
Free T4: 1.6 ng/dL — ABNORMAL HIGH (ref 0.61–1.12)

## 2024-03-20 LAB — T3: T3, Total: 77 ng/dL (ref 71–180)

## 2024-03-20 MED ORDER — SODIUM CHLORIDE 0.9% FLUSH: 10.0000 mL | INTRAVENOUS | Status: DC | PRN

## 2024-03-20 MED ORDER — IOHEXOL 350 MG/ML SOLN
100.0000 mL | Freq: Once | INTRAVENOUS | Status: AC | PRN
  Administered 2024-03-20: 100 mL via INTRAVENOUS

## 2024-03-20 MED ORDER — SODIUM CHLORIDE 0.9% FLUSH
10.0000 mL | Freq: Two times a day (BID) | INTRAVENOUS | Status: DC
  Administered 2024-03-20 – 2024-03-25 (×11): 10 mL
  Administered 2024-03-26: 20 mL
  Administered 2024-03-26 – 2024-03-29 (×6): 10 mL

## 2024-03-20 MED ORDER — MAGNESIUM SULFATE 4 GM/100ML IV SOLN
4.0000 g | Freq: Once | INTRAVENOUS | Status: AC
  Filled 2024-03-20: qty 100

## 2024-03-20 MED ORDER — IOHEXOL 350 MG/ML SOLN
50.0000 mL | Freq: Once | INTRAVENOUS | Status: AC | PRN
  Administered 2024-03-20: 50 mL via INTRAVENOUS

## 2024-03-20 MED ORDER — HEPARIN (PORCINE) 25000 UT/250ML-% IV SOLN
1300.0000 [IU]/h | INTRAVENOUS | Status: DC
  Administered 2024-03-20: 1300 [IU]/h via INTRAVENOUS
  Filled 2024-03-20: qty 250

## 2024-03-20 MED ORDER — POTASSIUM CHLORIDE 10 MEQ/100ML IV SOLN
10.0000 meq | INTRAVENOUS | Status: AC
  Administered 2024-03-20 – 2024-03-21 (×6): 10 meq via INTRAVENOUS
  Filled 2024-03-20 (×6): qty 100

## 2024-03-20 MED ORDER — DEXTROSE IN LACTATED RINGERS 5 % IV SOLN: INTRAVENOUS | Status: DC

## 2024-03-20 MED ORDER — CHOLESTYRAMINE 4 G PO PACK
4.0000 g | PACK | Freq: Four times a day (QID) | ORAL | Status: DC
  Administered 2024-03-20 – 2024-03-21 (×3): 4 g via ORAL
  Filled 2024-03-20 (×7): qty 1

## 2024-03-20 MED ORDER — POTASSIUM CHLORIDE 20 MEQ PO PACK
40.0000 meq | PACK | Freq: Once | ORAL | Status: AC
  Administered 2024-03-20: 40 meq via ORAL
  Filled 2024-03-20: qty 2

## 2024-03-20 MED ORDER — METHIMAZOLE 10 MG PO TABS
10.0000 mg | ORAL_TABLET | Freq: Every day | ORAL | Status: DC
  Administered 2024-03-20 – 2024-03-21 (×2): 10 mg via ORAL
  Filled 2024-03-20 (×2): qty 1

## 2024-03-20 MED ORDER — LACTATED RINGERS IV SOLN: INTRAVENOUS | Status: DC

## 2024-03-20 MED ORDER — SENNA 8.6 MG PO TABS
2.0000 | ORAL_TABLET | Freq: Every day | ORAL | Status: DC
  Administered 2024-03-20 – 2024-03-21 (×2): 17.2 mg via ORAL
  Filled 2024-03-20 (×2): qty 2

## 2024-03-20 MED ORDER — SODIUM CHLORIDE 0.9 % IV SOLN: INTRAVENOUS | Status: AC

## 2024-03-20 MED ORDER — INSULIN ASPART 100 UNIT/ML IJ SOLN
0.0000 [IU] | Freq: Three times a day (TID) | INTRAMUSCULAR | Status: DC
  Administered 2024-03-21: 2 [IU] via SUBCUTANEOUS
  Administered 2024-03-21: 3 [IU] via SUBCUTANEOUS
  Administered 2024-03-21: 2 [IU] via SUBCUTANEOUS
  Administered 2024-03-22: 3 [IU] via SUBCUTANEOUS
  Administered 2024-03-22: 8 [IU] via SUBCUTANEOUS
  Administered 2024-03-22: 3 [IU] via SUBCUTANEOUS
  Administered 2024-03-23: 5 [IU] via SUBCUTANEOUS
  Administered 2024-03-23: 2 [IU] via SUBCUTANEOUS
  Administered 2024-03-23: 5 [IU] via SUBCUTANEOUS
  Administered 2024-03-24 (×3): 3 [IU] via SUBCUTANEOUS
  Administered 2024-03-25: 5 [IU] via SUBCUTANEOUS
  Administered 2024-03-25: 3 [IU] via SUBCUTANEOUS
  Administered 2024-03-25: 5 [IU] via SUBCUTANEOUS
  Administered 2024-03-26 (×2): 3 [IU] via SUBCUTANEOUS
  Administered 2024-03-27 (×2): 5 [IU] via SUBCUTANEOUS
  Administered 2024-03-27: 2 [IU] via SUBCUTANEOUS
  Administered 2024-03-28: 5 [IU] via SUBCUTANEOUS
  Administered 2024-03-28: 11 [IU] via SUBCUTANEOUS
  Filled 2024-03-20 (×2): qty 3
  Filled 2024-03-20: qty 11
  Filled 2024-03-20: qty 3
  Filled 2024-03-20: qty 5
  Filled 2024-03-20: qty 3
  Filled 2024-03-20: qty 5
  Filled 2024-03-20: qty 2
  Filled 2024-03-20: qty 8
  Filled 2024-03-20: qty 5
  Filled 2024-03-20 (×2): qty 2
  Filled 2024-03-20 (×2): qty 5
  Filled 2024-03-20 (×2): qty 3
  Filled 2024-03-20: qty 5
  Filled 2024-03-20 (×3): qty 3

## 2024-03-20 MED ORDER — INSULIN GLARGINE-YFGN 100 UNIT/ML ~~LOC~~ SOLN
10.0000 [IU] | Freq: Every day | SUBCUTANEOUS | Status: DC
  Administered 2024-03-21 – 2024-03-23 (×4): 10 [IU] via SUBCUTANEOUS
  Filled 2024-03-20 (×7): qty 0.1

## 2024-03-20 MED ORDER — LACTATED RINGERS IV BOLUS
1000.0000 mL | Freq: Once | INTRAVENOUS | Status: AC
  Administered 2024-03-20: 1000 mL via INTRAVENOUS

## 2024-03-20 NOTE — Progress Notes (Addendum)
 PHARMACY - ANTICOAGULATION CONSULT NOTE  Pharmacy Consult for heparin gtt Indication: submassive PE [CTA]  Allergies  Allergen Reactions   Aspirin  Nausea Only    Patient stated to neuro that it caused an upset stomach   Codeine Other (See Comments)    Unknown    Fentanyl  Other (See Comments)    hypoxia   Oxycodone Hcl Other (See Comments)    Unknown    Penicillins Other (See Comments)    Unknown    Chlorhexidine Gluconate Rash    Patient Measurements: Height: 5' 3 (160 cm) Weight: 80.7 kg (178 lb) IBW/kg (Calculated) : 52.4 HEPARIN DW (KG): 70.1  Vital Signs: Temp: 98.7 F (37.1 C) (11/13 2001) Temp Source: Oral (11/13 2001) BP: 155/119 (11/13 2001) Pulse Rate: 94 (11/13 2001)  Labs: Recent Labs    03/18/24 1446 03/18/24 1453 03/18/24 1752 03/19/24 0006 03/19/24 0215 03/19/24 1042 03/19/24 1529 03/19/24 2117 03/20/24 0446 03/20/24 0951 03/20/24 1218 03/20/24 1802  HGB 14.0   < >  --   --  12.4  --  14.6  --  15.3*  --   --   --   HCT 43.8   < >  --   --  38.5  --  42.9  --  45.1  --   --   --   PLT 188  --   --   --  172  --  195  --  225  --   --   --   LABPROT 13.3  --   --   --   --   --   --   --   --   --   --   --   INR 1.0  --   --   --   --   --   --   --   --   --   --   --   CREATININE 1.15*   < >  --    < > 1.10*   < > 1.02*   < > 1.08* 1.15* 1.15* 1.14*  CKTOTAL  --   --   --   --   --   --  320*  --   --   --   --   --   TROPONINIHS 48*  --  41*  --   --   --   --   --   --   --   --   --    < > = values in this interval not displayed.    Estimated Creatinine Clearance: 44.2 mL/min (A) (by C-G formula based on SCr of 1.14 mg/dL (H)).   Medical History: Past Medical History:  Diagnosis Date   Atrial fibrillation (HCC)    Chronic disease anemia 03/29/2011   Diabetes mellitus    Dyslipidemia    Hypertension    Obesities, morbid (HCC)    Osteoarthritis     Medications:  Medications Prior to Admission  Medication Sig Dispense Refill  Last Dose/Taking   acetaminophen  (TYLENOL ) 325 MG tablet Take 1-2 tablets (325-650 mg total) by mouth every 4 (four) hours as needed for mild pain (pain score 1-3).   03/17/2024   amLODipine  (NORVASC ) 2.5 MG tablet Take 1 tablet (2.5 mg total) by mouth daily. 30 tablet 0 03/17/2024   apixaban  (ELIQUIS ) 5 MG TABS tablet Take 1 tablet (5 mg total) by mouth 2 (two) times daily. 200 tablet 2 03/17/2024 at  8:30 PM   aspirin   EC 81 MG tablet Take 1 tablet (81 mg total) by mouth daily. Swallow whole. 30 tablet 0 03/17/2024   benazepril  (LOTENSIN ) 20 MG tablet TAKE 1 TABLET BY MOUTH DAILY 100 tablet 2 03/17/2024   CALCIUM  PO Take 1 tablet by mouth 2 (two) times daily.   Unknown   camphor-menthol  (SARNA) lotion Apply topically as needed for itching. 222 mL 0 Past Month   celecoxib (CELEBREX) 200 MG capsule Take 1 capsule (200 mg total) by mouth daily as needed for moderate pain (pain score 4-6). 30 capsule 0 03/17/2024   diclofenac  Sodium (VOLTAREN ) 1 % GEL Apply 2 g topically 4 (four) times daily. To both knees 150 g 0 Past Week   DULoxetine  (CYMBALTA ) 60 MG capsule Take 60 mg by mouth daily.   Unknown   ezetimibe  (ZETIA ) 10 MG tablet Take 1 tablet (10 mg total) by mouth daily. 90 tablet 2 03/17/2024   famotidine  (PEPCID ) 20 MG tablet Take 1 tablet (20 mg total) by mouth daily. 30 tablet 0 03/17/2024   gabapentin  (NEURONTIN ) 100 MG capsule Take 1 capsule (100 mg total) by mouth 3 (three) times daily. (Patient taking differently: Take 300 mg by mouth See admin instructions. 1-2 times daily only) 90 capsule 0 03/17/2024   glimepiride  (AMARYL ) 1 MG tablet Take 1 tablet (1 mg total) by mouth daily with breakfast. 90 tablet 4 03/17/2024   JARDIANCE  25 MG TABS tablet Take 25 mg by mouth daily.   03/17/2024   lidocaine (LIDODERM) 5 % Place 2 patches onto the skin daily. Remove & Discard patch within 12 hours or as directed by MD 60 patch 0 Past Week   methimazole  (TAPAZOLE ) 5 MG tablet TAKE 1 TABLET BY MOUTH DAILY  100 tablet 2 03/17/2024   methocarbamol  (ROBAXIN ) 500 MG tablet Take 1 tablet (500 mg total) by mouth every 6 (six) hours as needed for muscle spasms. 30 tablet 0 03/17/2024   metoprolol  succinate (TOPROL -XL) 50 MG 24 hr tablet TAKE 1 TABLET BY MOUTH DAILY  WITH OR IMMEDIATELY FOLLOWING A  MEAL 90 tablet 3 03/17/2024   Multiple Vitamins-Minerals (MULTIVITAMIN WITH MINERALS) tablet Take 1 tablet by mouth daily.   03/17/2024   rosuvastatin  (CRESTOR ) 20 MG tablet Take 1 tablet (20 mg total) by mouth daily after supper. 30 tablet 0 03/17/2024   Semaglutide , 2 MG/DOSE, (OZEMPIC , 2 MG/DOSE,) 8 MG/3ML SOPN INJECT SUBCUTANEOUSLY 2 MG EVERY WEEK 9 mL 3 03/16/2024   Blood Glucose Monitoring Suppl DEVI 1 each by Does not apply route as directed. Dispense based on patient and insurance preference. Use up to four times daily as directed. (FOR ICD-10 E10.9, E11.9). 1 each 0    ferrous sulfate  325 (65 FE) MG EC tablet Take 1 tablet (325 mg total) by mouth 2 (two) times daily. (Patient not taking: Reported on 03/18/2024) 60 tablet 3 Not Taking   Glucose Blood (BLOOD GLUCOSE TEST STRIPS) STRP 1 each by Does not apply route as directed. Dispense based on patient and insurance preference. Use up to four times daily as directed. (FOR ICD-10 E10.9, E11.9). 100 strip 0    Lancet Device MISC 1 each by Does not apply route as directed. Dispense based on patient and insurance preference. Use up to four times daily as directed. (FOR ICD-10 E10.9, E11.9). 1 each 0    Lancets MISC 1 each by Does not apply route as directed. Dispense based on patient and insurance preference. Use up to four times daily as directed. (FOR ICD-10 E10.9, E11.9). 100 each  0    Scheduled:   acetaminophen   1,000 mg Oral Q6H   amLODipine   5 mg Oral Daily   aspirin  EC  81 mg Oral Daily   benazepril   20 mg Oral Daily   cholestyramine  4 g Oral QID   ezetimibe   10 mg Oral Daily   famotidine   20 mg Oral Daily   methimazole   10 mg Oral Daily    rosuvastatin   20 mg Oral QPC supper   senna  2 tablet Oral Daily   sodium chloride  flush  10-40 mL Intracatheter Q12H    Assessment: 73 YOF admitted w/ cc of progressively worsening HA, confusion, and restlessness discovered to have a submassive PE on CTA. She has a history of apixaban  use for AF. Last dose was taken 11/13 at 1228. 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 Patient to be re-assessed by night team [IMTS] and they will make a determination if another CT head is needed to evaluate for new CVA [rapid response ~1844]  Goal of Therapy:  Heparin level 0.3-0.5 units/ml aPTT 66-85 seconds Monitor platelets by anticoagulation protocol: Yes   Plan:  Start heparin infusion at 1300 units/hr Check anti-Xa level / aPTT in 8 hours and daily while on heparin Continue to monitor H&H and platelets   Chelsae Zanella BS, PharmD, BCPS Clinical Pharmacist 03/20/2024 9:14 PM  Contact: [phone] after 3 PM

## 2024-03-20 NOTE — Progress Notes (Signed)
 Megan Fischer, with a history of hypertension and atrial fibrillation on Eliquis , presented two days ago with confusion, intractable nausea/vomiting, and epigastric pain. She was found to have elevated lactate and was treated for thyroid  dysfunction and euglycemic DKA, but her confusion has continued to worsen. Her lactate has fluctuated, most recently 1.9 (previous values up to 3.6).  CTA chest showed a right lower lobe segmental PE with RV strain (RV/LV 1.1), stable cardiomegaly, enlarged PA consistent with pulmonary hypertension, and small LUL nodules likely inflammatory. Thyroid  appeared enlarged and heterogeneous; outpatient ultrasound is recommended.  On exam, she was confused, not oriented, and inconsistently responsive. Vitals: BP 155/119, HR 95, SpO2 95%. Co-oximetry showed O2 saturation 64%. Extremities were cold with persistent confusion. Cardiology evaluated her and recommended starting a heparin drip due to concern for impaired perfusion despite preserved blood pressure.  Echocardiogram showed EF 50-55% with no RWMA, mildly reduced RV systolic function, severely elevated PASP (64 mmHg), moderately dilated LA, mild MR/TR, no pericardial effusion, and a collapsible IVC.  # Submassive Pulmonary Embolism Given the combination of progressive confusion, co-ox (64%), cold extremities, and RV dysfunction, one working diagnosis is cerebral hypoperfusion due to inadequate stroke volume. Cardiology: started heparin drip and will see pt in AM. Despite normal HR and preserved BP, concern remained for impaired perfusion.  -IR was contacted; their impression was for subsegmental, non-occlusive PE and that the right heart findings were not due to strain from PE but rather a chronic finding. No indication for thrombectomy Plan: Stop Eliquis  Start heparin drip No IR intervention Trend lactate, neuro checks  # Euglycemic DKA (improving) AG closed; CBG in 150s; K 3, poor oral intake. Plan: Glargine 10  units Stop Endo Tool in 2 hours Repeat BMP Recheck lactate Stop D5 LR after Endo Tool Replete potassium

## 2024-03-20 NOTE — Progress Notes (Signed)
 LUE PICC CXray reviewed: Tip in R atrium. Discussed with radiologist, Dr. Carlean: Pull back 2cm to SVC.

## 2024-03-20 NOTE — Progress Notes (Signed)
 Daughter came but want to wait for her brother to decide regarding PICC insertion and requested to speak to a doctor.

## 2024-03-20 NOTE — Progress Notes (Signed)
 73 year old admitted for DKA. Assessed during p.m. rounds. She's comfortable at present, no abdominal pain, no headache. She's confused, states she's in the club. Nurse concerned about patient attempting to leave the bed. HR irregular ~90, strong radial pulse, breathing comfortably, no abdominal tenderness. Venous lactate remains elevated. Anion gap 15, slowly decreasing. Continue insulin  infusion for now. Repeat lactic acid with next BMP. Deferring meds for sedation for now as patient is not a danger to herself or others and easily redirectable.  Ozell Kung MD 03/20/2024, 12:59 AM

## 2024-03-20 NOTE — Progress Notes (Signed)
 HD#1 SUBJECTIVE:  Patient Summary: Megan Fischer is a 73 y.o. with a pertinent PMH of DM2, Afib (on eliquis ), CKD4, CVA (01/2024), and past multinodular goiter, who presented with headache and agitation and admitted for thyrotoxicosis.   Interim History:  11/13  Overnight, BP remained elevated, Na and K were low and required repletion, Mag 1.4. Lactic Acid 3.4 from 2.4, Anion Gap 17 from 15. She continued on Endotool overnight. Has had urine output overnight.  On interview, pt was resting comfortably in bed when we saw her this morning on rounds. She was not oriented to location, but was oriented to self and season. Was calm and cooperative.  OBJECTIVE:  Vital Signs: Vitals:   03/20/24 0600 03/20/24 0751 03/20/24 0803 03/20/24 0804  BP: (!) 156/145 (!) 183/118 (!) 177/113   Pulse:  93 98   Resp:  18    Temp: 98.8 F (37.1 C) 98.7 F (37.1 C)    TempSrc: Oral Oral    SpO2: 99% 93% 100% 100%  Weight:      Height:       SpO2: 100 %  Filed Weights   03/18/24 1417  Weight: 80.7 kg    Intake/Output Summary (Last 24 hours) at 03/20/2024 9096 Last data filed at 03/19/2024 2207 Gross per 24 hour  Intake 600 ml  Output 1600 ml  Net -1000 ml   Net IO Since Admission: -1,000 mL [03/20/24 0903]  Physical Exam: Physical Exam Vitals and nursing note reviewed. Exam conducted with a chaperone present.  Constitutional:      General: She is not in acute distress.    Appearance: She is not toxic-appearing.  Neck:     Comments: Goiter palpable on R side of neck Cardiovascular:     Rate and Rhythm: Normal rate and regular rhythm.  Pulmonary:     Effort: Pulmonary effort is normal. No respiratory distress.  Abdominal:     Palpations: Abdomen is soft.     Tenderness: There is no abdominal tenderness.  Skin:    General: Skin is warm.     Comments: Feet cool to touch.  Neurological:     Mental Status: She is alert. She is disoriented.     Comments: Disoriented to location. Denies  this is a hospital.  Psychiatric:        Mood and Affect: Affect is blunt.    Patient Lines/Drains/Airways Status     Active Line/Drains/Airways     Name Placement date Placement time Site Days   Peripheral IV 03/18/24 20 G Left Antecubital 03/18/24  2121  Antecubital  1            Pertinent labs and imaging:      Latest Ref Rng & Units 03/20/2024    4:46 AM 03/19/2024    3:29 PM 03/19/2024    2:15 AM  CBC  WBC 4.0 - 10.5 K/uL 9.8  6.6  6.8   Hemoglobin 12.0 - 15.0 g/dL 84.6  85.3  87.5   Hematocrit 36.0 - 46.0 % 45.1  42.9  38.5   Platelets 150 - 400 K/uL 225  195  172        Latest Ref Rng & Units 03/20/2024    4:46 AM 03/19/2024   11:03 PM 03/19/2024    9:17 PM  CMP  Glucose 70 - 99 mg/dL 866  883  889   BUN 8 - 23 mg/dL 19  15  15    Creatinine 0.44 - 1.00 mg/dL 8.91  1.02  0.96   Sodium 135 - 145 mmol/L 130  133  134   Potassium 3.5 - 5.1 mmol/L 3.3  3.3  3.2   Chloride 98 - 111 mmol/L 91  92  92   CO2 22 - 32 mmol/L 22  26  23    Calcium  8.9 - 10.3 mg/dL 8.6  8.6  8.9     ECHOCARDIOGRAM COMPLETE Result Date: 03/19/2024    ECHOCARDIOGRAM REPORT   Patient Name:   Megan Fischer Date of Exam: 03/19/2024 Medical Rec #:  [id number]     Height:       63.0 in Accession #:    [id number]    Weight:       178.0 lb Date of Birth:  1951-01-27    BSA:          1.840 m Patient Age:    73 years      BP:           132/90 mmHg Patient Gender: F             HR:           95 bpm. Exam Location:  Inpatient Procedure: 2D Echo (Both Spectral and Color Flow Doppler were utilized during            procedure). STAT ECHO Indications:    acute diastolic chf  History:        Patient has prior history of Echocardiogram examinations, most                 recent 01/21/2024. Chronic kidney disease, Arrythmias:Atrial                 Fibrillation; Risk Factors:Hypertension, Diabetes and                 Dyslipidemia.  Sonographer:    Tinnie Barefoot RDCS Referring Phys: [id number] JEFFREY C HATCHER  IMPRESSIONS  1. Left ventricular ejection fraction, by estimation, is 50 to 55%. The left ventricle has low normal function. The left ventricle has no regional wall motion abnormalities. Left ventricular diastolic function could not be evaluated.  2. Right ventricular systolic function is mildly reduced. The right ventricular size is normal. There is severely elevated pulmonary artery systolic pressure.  3. Left atrial size was moderately dilated.  4. The mitral valve is degenerative. Mild mitral valve regurgitation. No evidence of mitral stenosis.  5. The aortic valve is normal in structure. Aortic valve regurgitation is not visualized. No aortic stenosis is present.  6. The inferior vena cava is normal in size with greater than 50% respiratory variability, suggesting right atrial pressure of 3 mmHg. FINDINGS  Left Ventricle: Left ventricular ejection fraction, by estimation, is 50 to 55%. The left ventricle has low normal function. The left ventricle has no regional wall motion abnormalities. The left ventricular internal cavity size was normal in size. There is no left ventricular hypertrophy. Left ventricular diastolic function could not be evaluated. Right Ventricle: The right ventricular size is normal. No increase in right ventricular wall thickness. Right ventricular systolic function is mildly reduced. There is severely elevated pulmonary artery systolic pressure. The tricuspid regurgitant velocity is 3.49 m/s, and with an assumed right atrial pressure of 15 mmHg, the estimated right ventricular systolic pressure is 63.7 mmHg. Left Atrium: Left atrial size was moderately dilated. Right Atrium: Right atrial size was normal in size. Pericardium: There is no evidence of pericardial effusion. Mitral Valve: The mitral valve is degenerative in appearance. Mild  mitral annular calcification. Mild mitral valve regurgitation. No evidence of mitral valve stenosis. Tricuspid Valve: The tricuspid valve is normal in  structure. Tricuspid valve regurgitation is mild . No evidence of tricuspid stenosis. Aortic Valve: The aortic valve is normal in structure. Aortic valve regurgitation is not visualized. No aortic stenosis is present. Pulmonic Valve: The pulmonic valve was normal in structure. Pulmonic valve regurgitation is mild. No evidence of pulmonic stenosis. Aorta: The aortic root is normal in size and structure. Venous: The inferior vena cava is normal in size with greater than 50% respiratory variability, suggesting right atrial pressure of 3 mmHg. IAS/Shunts: The atrial septum is grossly normal.  LEFT VENTRICLE PLAX 2D LVIDd:         4.90 cm LVIDs:         3.30 cm LV PW:         1.00 cm LV IVS:        0.90 cm LVOT diam:     2.10 cm LV SV:         42 LV SV Index:   23 LVOT Area:     3.46 cm  LEFT ATRIUM           Index        RIGHT ATRIUM           Index LA diam:      4.00 cm 2.17 cm/m   RA Area:     13.30 cm LA Vol (A4C): 61.7 ml 33.53 ml/m  RA Volume:   30.00 ml  16.30 ml/m  AORTIC VALVE LVOT Vmax:   62.60 cm/s LVOT Vmean:  40.900 cm/s LVOT VTI:    0.120 m  AORTA Ao Root diam: 2.70 cm Ao Asc diam:  3.10 cm TRICUSPID VALVE TR Peak grad:   48.7 mmHg TR Vmax:        349.00 cm/s  SHUNTS Systemic VTI:  0.12 m Systemic Diam: 2.10 cm Morene Brownie Electronically signed by Morene Brownie Signature Date/Time: 03/19/2024/6:12:10 PM    Final    US  EKG SITE RITE Result Date: 03/19/2024 If Site Rite image not attached, placement could not be confirmed due to current cardiac rhythm.   ASSESSMENT/PLAN:  Assessment: Principal Problem:   Euglycemic DKA (diabetic ketoacidosis) (HCC) Active Problems:   Atrial fibrillation (HCC)   Hypertension   DM (diabetes mellitus) type II controlled with renal manifestation (HCC)   CKD (chronic kidney disease) stage 3, GFR 30-59 ml/min (HCC)   Abdominal pain, epigastric   Hemiplegia and hemiparesis following cerebral infarction affecting left non-dominant side (HCC)   Headache    Vomiting   Hyperthyroidism   Delirium   High anion gap metabolic acidosis   Lactic acidosis  Oluwademilade Kellett is a 73 year old woman with a history of hypertension, type 2 diabetes, atrial fibrillation on apixaban , chronic kidney disease, and prior right PCA stroke who presented with worsening left-sided headache, restlessness, and confusion. She was admitted for evaluation and management of acute encephalopathy and refractory headache on hospital day 0.   Euglycemic DKA is the most likely primary concern for this patient. The elevated thyroid  may be contributing as a secondary concern.  Plan: # Acute encephalopathy / Confusion # Delirium # Euglycemic DKA #Lactic Acidosis  Patient with recent right PCA stroke now presenting with three days of worsening confusion and agitation, especially at night.  Workup: CT head without acute change. UA showed >500 glucosuria, Beta-hydroxybutrtic Acid 2.59, Lactic Acidosis, high anion gap, low bicarb on admission, and is on  jardiance  for her Diabetes. WBC normal, and she is afebrile. Lactic acid 3.9 on admission, lowered to 2.4 with endotool, but returned to 3.4. Bicarb 23.9. Salicylate WNL. Her son reports waxing and waning mentation and increased nighttime wakefulness. No focal deficits.  Plan: - Endotool until Gap closes 2x, on LR and D5LR if glucose is less than 250 - Stop sedating meds - Delirium precautions - IV hydration and electrolyte correction as needed - Check TSH, repeat BMP. - Consider MRI for new infarction - Q4BMPs - AM T4  - Ordered CTA Abdomen/Pelvis as Lactic Acid has worsened  # Hyperthyroidism on Multinodular goiter (new diagnosis) TSH <0.1  and free T4 1.96. Less likely thyroid  storm given the normothermia and stable vital signs. 11/13 T4 1.56 Plan: - Stop propranolol - Continue Methimazole  5 mg daily - Continue to monitor vitals and heart rate  # Refractory unilateral headache (status migrainosus vs post-stroke  pain) Persistent sharp left retro-orbital headache for 3,4 days associated with nausea and vomiting started at hospital, unresponsive to multiple migraine medications (metoclopramide, prochlorperazine , magnesium, valproate, droperidol, diphenhydramine ). Head CT unchanged; no new infarct or bleed. No meningismus or visual changes. Presentation is consistent with status migraine, though post-stroke pain may contribute. Triptans and ergots are contraindicated given her stroke history, and further opioids or sedatives would worsen delirium. Plan: - Gave dexamethasone 10 mg IV once  - Acetaminophen  PRN; avoid opioids and benzodiazepines. - consider chlorpromazine 25 mg IV - Neurology follow-up for long-term migraine vs post-stroke pain management.   # Nausea, vomiting, and abdominal discomfort Symptoms began after admission. CT abdomen and pelvis showed no acute process. Abdomen is soft and mildly tender. She is afebrile with normal lipase. Ddx: Thyrotoxicosis, medication effects from antiemetics, valproate, and fentanyl , migraine-related nausea, and mild gastritis or ileus from poor intake and stress. There is no evidence of obstruction, pancreatitis, or infection. Plan: - Give ondansetron 4 mg PO as needed - Avoid opioids and dopamine-blocking antiemetics - consider CTA to rule out mesenteric ischemia  # Chronic health conditions Continuing the following home medications - Amlodipine  5mg  - Benazepril  20mg  daily - Zetia  10mg  daily - Eliquis  5mg  BID (A.Fib)  Best Practice: Diet: NPO VTE: Eliquis  5mg  BID (A. Fib) Code: DNR/DNI  Disposition planning: DISPO: Anticipated discharge in 5-7 days to pending clinical improvement.  Penne Mori DO - PGY1 [facility]  9:03 AM, 03/20/2024  On Call pager [phone]

## 2024-03-20 NOTE — Progress Notes (Signed)
 Alerted by nursing of change in mental status.  Reports that the patient was barely responsive.  Went to check on the patient and rapid response was evaluating.  Patient was slightly somnolent but following most commands and moves all 4 extremities.  Vitals shown below showed stable hypertension, no tachycardia, no fever, and no hypoxia.  On exam she was somnolent but arousable to verbal stimuli, followed simple commands, moves all 4 extremities against gravity, had coolness in bilateral hands and feet (stable as reported from primary team).  She denied any acute concerns including chest pain, shortness of breath, abdominal pain, nausea, vomiting.  Labs were drawn just prior to this change in status which showed lactic acid stable at 2.7 and BMP showed improving anion gap metabolic acidosis, CBG was 187.  Co-ox was yet to be drawn.  Overall unsure of the cause of her change in mental status however I have a low suspicion for new CVA, sepsis, worsening thyrotoxicosis/thyroid  storm, iatrogenic hypoglycemia.  However her limbs remain cool with evidence of RV dysfunction so a co-ox panel will be helpful to determine if she is in cardiogenic shock.  Agree with cardiology to order CTA PE study.  Will hold off on head CT and have handed off to overnight team who will reassess the patient within the next hour.  Also added on a free T4 to the labs checked just before her change in status.  Blood pressure (!) 168/105, pulse 91, temperature 99.5 F (37.5 C), temperature source Oral, resp. rate 17, height 5' 3 (1.6 m), weight 80.7 kg, SpO2 97%.   Fairy Pool, DO Internal Medicine Resident, PGY-3 Please contact the on call pager at [phone] for any urgent or emergent needs. 7:15 PM 03/20/2024

## 2024-03-20 NOTE — Plan of Care (Addendum)
 Received pt confused agitated, restless though redirectable, refused oral pain meds, MD made aware. Seen by MD at bebside. Daughter and Son also were able to talk to a doctor regarding PICC and decided to consent the insertion.  0530 pt is asleep right now, still on Endotool, maintained on sitter.  Problem: Health Behavior/Discharge Planning: Goal: Ability to manage health-related needs will improve Outcome: Progressing   Problem: Clinical Measurements: Goal: Will remain free from infection Outcome: Progressing   Problem: Activity: Goal: Risk for activity intolerance will decrease Outcome: Progressing   Problem: Nutrition: Goal: Adequate nutrition will be maintained Outcome: Progressing   Problem: Coping: Goal: Level of anxiety will decrease Outcome: Progressing   P Problem: Pain Managment: Goal: General experience of comfort will improve and/or be controlled Outcome: Progressing   Problem: Safety: Goal: Ability to remain free from injury will improve Outcome: Progressing   Problem: Skin Integrity: Goal: Risk for impaired skin integrity will decrease Outcome: Progressing

## 2024-03-20 NOTE — Progress Notes (Signed)
 Peripherally Inserted Central Catheter Placement  The IV Nurse has discussed with the patient and/or persons authorized to consent for the patient, the purpose of this procedure and the potential benefits and risks involved with this procedure.  The benefits include less needle sticks, lab draws from the catheter, and the patient may be discharged home with the catheter. Risks include, but not limited to, infection, bleeding, blood clot (thrombus formation), and puncture of an artery; nerve damage and irregular heartbeat and possibility to perform a PICC exchange if needed/ordered by physician.  Alternatives to this procedure were also discussed.  B[employer] PICC patient education guide, fact sheet on infection prevention and patient information card has been provided to patient /or left at bedside.  Telephone consent obtained from son Braden Deloach due to altered mental status.  PICC Placement Documentation  PICC Double Lumen 03/20/24 Left Brachial 42 cm 0 cm (Active)  Indication for Insertion or Continuance of Line Vasoactive infusions 03/20/24 1631  Exposed Catheter (cm) 0 cm 03/20/24 1631  Site Assessment Clean, Dry, Intact 03/20/24 1631  Lumen #1 Status Flushed;Saline locked;Blood return noted 03/20/24 1631  Lumen #2 Status Flushed;Saline locked;Blood return noted 03/20/24 1631  Dressing Type Transparent;Securing device 03/20/24 1631  Dressing Status Antimicrobial disc/dressing in place;Clean, Dry, Intact 03/20/24 1631  Line Care Connections checked and tightened 03/20/24 1631  Line Adjustment (NICU/IV Team Only) No 03/20/24 1631  Dressing Intervention New dressing;Adhesive placed at insertion site (IV team only) 03/20/24 1631  Dressing Change Due 03/27/24 03/20/24 1631       Neddie Steedman, Cherene Place 03/20/2024, 4:32 PM

## 2024-03-20 NOTE — Consult Note (Addendum)
 Cardiology Consultation   Patient ID: Megan Fischer MRN: [id number]; DOB: [birth year]  Admit date: 03/18/2024 Date of Consult: 03/20/2024  PCP:  Bernadine Manos, MD   [facility] Providers Cardiologist:  Gordy Bergamo, MD        Patient Profile: Megan Fischer is a 73 y.o. female with a hx of hypertension, hyperlipidemia, DM2, CKD stage III, permanent atrial fibrillation, multinodular goiter and a history of CVA in September 2025 who is being seen 03/20/2024 for the evaluation of right heart failure and the possible cardiogenic shock at the request of Dr. Eben.  History of Present Illness: Megan Fischer is a 73 year old female with past medical history of hypertension, hyperlipidemia, DM2, CKD stage III, permanent atrial fibrillation, multinodular goiter and a history of CVA in September 2025.  Patient was initially followed by Dr. Alveta and later transition to Dr. Bergamo after Dr. Alveta retired.  Previous Myoview  in April 2023 was low risk study, EF 66%.  Echocardiogram obtained around the same time showed EF of 60 to 65%, normal LV systolic function, no regional wall motion abnormality, mild LVH, mild MR.  Patient was last seen by Dr. Bergamo in August 2025 at which time she was stable.  Heart rate was controlled on metoprolol  succinate and Cardizem  360 mg daily.  She was also on Eliquis  5 mg twice a day.    Unfortunately she was admitted to the hospital a month later 01/21/2024 with acute CVA.  MRI of the brain showed moderately large acute right PCA infarct, mild chronic small vessel ischemic disease.  Echocardiogram obtained on 01/21/2024 showed EF of 60 to 65%, no regional wall motion abnormality, mild LVH, normal RV systolic function with moderately elevated PA systolic pressure of 56.2 mmHg, trivial MR, moderate TR, negative agitated saline contrast bubble study with no evidence of interatrial shunt.  Patient was seen by neurology service.  Aspirin  was added on top of Eliquis .   Patient was ultimately discharged to inpatient rehab.  CTA obtained on 02/18/2024 showed no evidence of acute PE, multiple tiny pulmonary nodules measuring up to 3 to 4 mm, degenerative and postoperative changes in the right shoulder with large joint effusion and intra-articular loose bodies.  Patient was discharged home from inpatient rehab on 02/15/2024.  She returned back to the hospital on 03/18/2024 with abdominal pain, intractable vomiting, 3-day onset of worsening headache, confusion and restlessness.  She was tachycardic on arrival.  Blood pressure elevated.  Initial blood work showed a creatinine of 1.15.  Normal electrolyte.  Serial troponin 48--41.  Hemoglobin was 14.0.  White blood cell count normal.  CT of the head obtained 03/18/2024 showed no acute intracranial abnormality, stable sequela of the chronic right PCA territory infarct.  CT of abdomen and pelvis showed heterogeneous area of decreased cortical enhancement within the left kidney suspicious for pyelonephritis, small hiatal hernia, otherwise no acute intra-abdominal or intrapelvic process.  Blood culture has been obtained and so far negative x 2 days.  Urine culture also shows no growth.  Lactic acid elevated at 3.9 --> 2.4 --> 3.0 --> 3.0 --> 2.4 --> 3.4 --> 3.6 --> 2.6 --> 2.4.  BNP elevated at 789.8.  Total CK 320.  Echocardiogram obtained on 03/19/2024 showed EF 50 to 55%, no regional wall motion abnormality, mildly reduced RV systolic pressure, severely elevated PA systolic pressure, mild MR.  During the hospitalization, she was found to have TSH less than 0.1, free T4 elevated at 1.96.  Given history of multinodular goiter, concerning for  thyroid  toxicosis.  Patient has been started on methimazole .  Pulmonology service was consulted on 03/19/2024 due to concern for acute right heart failure and worsening leg edema.  Pulmonology service was also asked to evaluate nonclearing lactate.  During exam, patient was alert and oriented but  thought she was at home.  She was in no acute distress.  Cardiology service consulted for possible right heart failure.  At the time of interview, patient is quite confused, required chest PA order to wake up and answer some the questions.    Past Medical History:  Diagnosis Date   Atrial fibrillation (HCC)    Chronic disease anemia 03/29/2011   Diabetes mellitus    Dyslipidemia    Hypertension    Obesities, morbid (HCC)    Osteoarthritis     Past Surgical History:  Procedure Laterality Date   CHOLECYSTECTOMY     REPLACEMENT TOTAL KNEE BILATERAL     SHOULDER SURGERY     RIGHT SHOULDER   TUBAL LIGATION       Home Medications:  Prior to Admission medications   Medication Sig Start Date End Date Taking? Authorizing Provider  acetaminophen  (TYLENOL ) 325 MG tablet Take 1-2 tablets (325-650 mg total) by mouth every 4 (four) hours as needed for mild pain (pain score 1-3). 01/31/24  Yes Love, Sharlet RAMAN, PA-C  amLODipine  (NORVASC ) 2.5 MG tablet Take 1 tablet (2.5 mg total) by mouth daily. 02/28/24  Yes Amilibia, Jaden, DO  apixaban  (ELIQUIS ) 5 MG TABS tablet Take 1 tablet (5 mg total) by mouth 2 (two) times daily. 02/28/24  Yes Amilibia, Jaden, DO  aspirin  EC 81 MG tablet Take 1 tablet (81 mg total) by mouth daily. Swallow whole. 02/28/24  Yes Amilibia, Jaden, DO  benazepril  (LOTENSIN ) 20 MG tablet TAKE 1 TABLET BY MOUTH DAILY 02/26/24  Yes Azadegan, Maryam, MD  CALCIUM  PO Take 1 tablet by mouth 2 (two) times daily.   Yes [provider]  camphor-menthol  VIKKI) lotion Apply topically as needed for itching. 02/28/24  Yes Amilibia, Jaden, DO  celecoxib (CELEBREX) 200 MG capsule Take 1 capsule (200 mg total) by mouth daily as needed for moderate pain (pain score 4-6). 02/28/24 03/29/24 Yes Amilibia, Jaden, DO  diclofenac  Sodium (VOLTAREN ) 1 % GEL Apply 2 g topically 4 (four) times daily. To both knees 02/28/24  Yes Amilibia, Jaden, DO  DULoxetine  (CYMBALTA ) 60 MG capsule Take 60 mg by  mouth daily. 02/15/24  Yes [provider]  ezetimibe  (ZETIA ) 10 MG tablet Take 1 tablet (10 mg total) by mouth daily. 12/18/23 03/18/24 Yes Ladona Heinz, MD  famotidine  (PEPCID ) 20 MG tablet Take 1 tablet (20 mg total) by mouth daily. 02/14/24  Yes Love, Sharlet RAMAN, PA-C  gabapentin  (NEURONTIN ) 100 MG capsule Take 1 capsule (100 mg total) by mouth 3 (three) times daily. Patient taking differently: Take 300 mg by mouth See admin instructions. 1-2 times daily only 02/28/24  Yes Amilibia, Jaden, DO  glimepiride  (AMARYL ) 1 MG tablet Take 1 tablet (1 mg total) by mouth daily with breakfast. 10/18/23  Yes Thapa, Sudan, MD  JARDIANCE  25 MG TABS tablet Take 25 mg by mouth daily. 02/26/24  Yes [provider]  lidocaine (LIDODERM) 5 % Place 2 patches onto the skin daily. Remove & Discard patch within 12 hours or as directed by MD 02/14/24  Yes Love, Sharlet RAMAN, PA-C  methimazole  (TAPAZOLE ) 5 MG tablet TAKE 1 TABLET BY MOUTH DAILY 02/20/24  Yes Thapa, Sudan, MD  methocarbamol  (ROBAXIN ) 500 MG tablet  Take 1 tablet (500 mg total) by mouth every 6 (six) hours as needed for muscle spasms. 02/14/24  Yes Love, Sharlet RAMAN, PA-C  metoprolol  succinate (TOPROL -XL) 50 MG 24 hr tablet TAKE 1 TABLET BY MOUTH DAILY  WITH OR IMMEDIATELY FOLLOWING A  MEAL 09/20/23  Yes Trudy Mliss Dragon, MD  Multiple Vitamins-Minerals (MULTIVITAMIN WITH MINERALS) tablet Take 1 tablet by mouth daily.   Yes [provider]  rosuvastatin  (CRESTOR ) 20 MG tablet Take 1 tablet (20 mg total) by mouth daily after supper. 02/28/24  Yes Amilibia, Jaden, DO  Semaglutide , 2 MG/DOSE, (OZEMPIC , 2 MG/DOSE,) 8 MG/3ML SOPN INJECT SUBCUTANEOUSLY 2 MG EVERY WEEK 06/22/23  Yes Thapa, Sudan, MD  Blood Glucose Monitoring Suppl DEVI 1 each by Does not apply route as directed. Dispense based on patient and insurance preference. Use up to four times daily as directed. (FOR ICD-10 E10.9, E11.9). 02/28/24   Amilibia, Jaden, DO  ferrous sulfate  325 (65  FE) MG EC tablet Take 1 tablet (325 mg total) by mouth 2 (two) times daily. Patient not taking: Reported on 03/18/2024 09/27/14   Lanny Callander, MD  Glucose Blood (BLOOD GLUCOSE TEST STRIPS) STRP 1 each by Does not apply route as directed. Dispense based on patient and insurance preference. Use up to four times daily as directed. (FOR ICD-10 E10.9, E11.9). 02/28/24   Amilibia, Jaden, DO  Lancet Device MISC 1 each by Does not apply route as directed. Dispense based on patient and insurance preference. Use up to four times daily as directed. (FOR ICD-10 E10.9, E11.9). 02/28/24   Amilibia, Jaden, DO  Lancets MISC 1 each by Does not apply route as directed. Dispense based on patient and insurance preference. Use up to four times daily as directed. (FOR ICD-10 E10.9, E11.9). 02/28/24   Amilibia, Jaden, DO    Scheduled Meds:  acetaminophen   1,000 mg Oral Q6H   amLODipine   5 mg Oral Daily   apixaban   5 mg Oral BID   aspirin  EC  81 mg Oral Daily   benazepril   20 mg Oral Daily   cholestyramine  4 g Oral QID   ezetimibe   10 mg Oral Daily   famotidine   20 mg Oral Daily   methimazole   10 mg Oral Daily   rosuvastatin   20 mg Oral QPC supper   senna  2 tablet Oral Daily   sodium chloride  flush  10-40 mL Intracatheter Q12H   Continuous Infusions:  dextrose 5% lactated ringers 125 mL/hr at 03/20/24 1442   insulin  1.2 Units/hr (03/20/24 1644)   magnesium sulfate bolus IVPB     PRN Meds: dextrose, ondansetron (ZOFRAN) IV, polyethylene glycol, sodium chloride  flush  Allergies:    Allergies  Allergen Reactions   Aspirin  Nausea Only    Patient stated to neuro that it caused an upset stomach   Codeine Other (See Comments)    Unknown    Fentanyl  Other (See Comments)    hypoxia   Oxycodone Hcl Other (See Comments)    Unknown    Penicillins Other (See Comments)    Unknown    Chlorhexidine Gluconate Rash    Social History:   Social History   Socioeconomic History   Marital status: Married    Spouse  name: Not on file   Number of children: Not on file   Years of education: Not on file   Highest education level: Not on file  Occupational History   Not on file  Tobacco Use   Smoking status: Never  Smokeless tobacco: Never  Vaping Use   Vaping status: Never Used  Substance and Sexual Activity   Alcohol use: No   Drug use: No   Sexual activity: Not Currently  Other Topics Concern   Not on file  Social History Narrative   Not on file   Social Drivers of Health   Financial Resource Strain: Low Risk  (10/19/2023)   Received from [facility]   Overall Financial Resource Strain (CARDIA)    Difficulty of Paying Living Expenses: Not hard at all  Food Insecurity: No Food Insecurity (01/21/2024)   Hunger Vital Sign    Worried About Running Out of Food in the Last Year: Never true    Ran Out of Food in the Last Year: Never true  Transportation Needs: No Transportation Needs (01/21/2024)   PRAPARE - Administrator, Civil Service (Medical): No    Lack of Transportation (Non-Medical): No  Physical Activity: Insufficiently Active (03/01/2023)   Exercise Vital Sign    Days of Exercise per Week: 7 days    Minutes of Exercise per Session: 20 min  Stress: No Stress Concern Present (03/01/2023)   [employer] of Occupational Health - Occupational Stress Questionnaire    Feeling of Stress : Not at all  Social Connections: Unknown (01/21/2024)   Social Connection and Isolation Panel    Frequency of Communication with Friends and Family: More than three times a week    Frequency of Social Gatherings with Friends and Family: Once a week    Attends Religious Services: Not on Insurance Claims Handler of Clubs or Organizations: Not on file    Attends Banker Meetings: Not on file    Marital Status: Not on file  Intimate Partner Violence: Unknown (01/21/2024)   Humiliation, Afraid, Rape, and Kick questionnaire    Fear of Current or Ex-Partner: No     Emotionally Abused: No    Physically Abused: Not on file    Sexually Abused: No    Family History:    Family History  Problem Relation Age of Onset   Heart attack Mother    Stroke Mother    Diabetes Mother    Hypertension Brother    Breast cancer Neg Hx      ROS:  Please see the history of present illness.   All other ROS reviewed and negative.     Physical Exam/Data: Vitals:   03/20/24 0751 03/20/24 0803 03/20/24 0804 03/20/24 1300  BP: (!) 183/118 (!) 177/113  (!) 152/133  Pulse: 93 98    Resp: 18   18  Temp: 98.7 F (37.1 C)   98.4 F (36.9 C)  TempSrc: Oral   Oral  SpO2: 93% 100% 100%   Weight:      Height:        Intake/Output Summary (Last 24 hours) at 03/20/2024 1719 Last data filed at 03/19/2024 2207 Gross per 24 hour  Intake 600 ml  Output 1600 ml  Net -1000 ml      03/18/2024    2:17 PM 02/28/2024    9:25 AM 02/18/2024   12:36 AM  Last 3 Weights  Weight (lbs) 178 lb 178 lb 185 lb  Weight (kg) 80.74 kg 80.74 kg 83.915 kg     Body mass index is 31.53 kg/m.  General: She is very drowsy and require chest rub be ordered to wake up, however interestingly, she is oriented x 3 with location, family members name and  the year. HEENT: normal Neck: no JVD Vascular: No carotid bruits; Distal pulses 2+ bilaterally Cardiac: Irregularly irregular; no murmur  Lungs:  clear to auscultation bilaterally, no wheezing, rhonchi or rales  Abd: soft, nontender, no hepatomegaly  Ext: no edema Musculoskeletal:  No deformities, BUE and BLE strength normal and equal Skin: warm and dry  Neuro: Oriented x 3, very drowsy on exam. Psych:  Normal affect   EKG:  The EKG was personally reviewed and demonstrates: Atrial fibrillation, heart rate 109, nonspecific T wave changes. Telemetry:  Telemetry was personally reviewed and demonstrates: Atrial fibrillation, heart rate mostly in the 80s to 90s, no significant ventricular ectopy.  Relevant CV Studies:  Echo 03/19/2024 1.  Left ventricular ejection fraction, by estimation, is 50 to 55%. The  left ventricle has low normal function. The left ventricle has no regional  wall motion abnormalities. Left ventricular diastolic function could not  be evaluated.   2. Right ventricular systolic function is mildly reduced. The right  ventricular size is normal. There is severely elevated pulmonary artery  systolic pressure.   3. Left atrial size was moderately dilated.   4. The mitral valve is degenerative. Mild mitral valve regurgitation. No  evidence of mitral stenosis.   5. The aortic valve is normal in structure. Aortic valve regurgitation is  not visualized. No aortic stenosis is present.   6. The inferior vena cava is normal in size with greater than 50%  respiratory variability, suggesting right atrial pressure of 3 mmHg.    Laboratory Data: High Sensitivity Troponin:   Recent Labs  Lab 03/18/24 1446 03/18/24 1752  TROPONINIHS 48* 41*     Chemistry Recent Labs  Lab 03/18/24 1446 03/18/24 1453 03/20/24 0446 03/20/24 0951 03/20/24 1218  NA 140   < > 130* 133* 132*  K 3.8   < > 3.3* 4.2 4.1  CL 106   < > 91* 96* 95*  CO2 20*   < > 22 20* 20*  GLUCOSE 149*   < > 133* 163* 160*  BUN 18   < > 19 20 20   CREATININE 1.15*   < > 1.08* 1.15* 1.15*  CALCIUM  9.4   < > 8.6* 9.1 8.7*  MG 1.7  --  1.4*  --   --   GFRNONAA 50*   < > 54* 50* 50*  ANIONGAP 14   < > 17* 17* 17*   < > = values in this interval not displayed.    Recent Labs  Lab 03/19/24 0006 03/19/24 0215 03/20/24 1218  PROT 7.5 7.7 8.1  ALBUMIN 3.6 3.6 3.3*  AST 23 28 40  ALT 14 15 21   ALKPHOS 103 104 115  BILITOT 0.9 1.2 1.2   Lipids No results for input(s): CHOL, TRIG, HDL, LABVLDL, LDLCALC, CHOLHDL in the last 168 hours.  Hematology Recent Labs  Lab 03/19/24 0215 03/19/24 1529 03/20/24 0446  WBC 6.8 6.6 9.8  RBC 4.38 5.08 5.39*  HGB 12.4 14.6 15.3*  HCT 38.5 42.9 45.1  MCV 87.9 84.4 83.7  MCH 28.3 28.7 28.4   MCHC 32.2 34.0 33.9  RDW 14.7 14.6 14.3  PLT 172 195 225   Thyroid   Recent Labs  Lab 03/19/24 0006 03/20/24 0446  TSH <0.100*  --   FREET4 1.96* 1.56*    BNP Recent Labs  Lab 03/19/24 1529  BNP 789.8*    DDimer No results for input(s): DDIMER in the last 168 hours.  Radiology/Studies:  CT Angio Abd/Pel w/ and/or w/o  Result Date: 03/20/2024 EXAM: CTA ABDOMEN AND PELVIS WITHOUT AND WITH CONTRAST 03/20/2024 11:35:00 AM TECHNIQUE: CTA images of the abdomen and pelvis without and with intravenous contrast. 100 mL of iohexol  (OMNIPAQUE ) 350 MG/ML injection was administered. Three-dimensional MIP/volume rendered formations were performed. Automated exposure control, iterative reconstruction, and/or weight based adjustment of the mA/kV was utilized to reduce the radiation dose to as low as reasonably achievable. COMPARISON: 03/18/2024. CLINICAL HISTORY: Mesenteric ischemia, acute. FINDINGS: VASCULATURE: AORTA: No acute finding. No abdominal aortic aneurysm. No dissection. CELIAC TRUNK: No acute finding. No occlusion or significant stenosis. SUPERIOR MESENTERIC ARTERY: No acute finding. No occlusion or significant stenosis. RENAL ARTERIES: No acute finding. No occlusion or significant stenosis. ILIAC ARTERIES: No acute finding. No occlusion or significant stenosis. LIVER: The liver is unremarkable. GALLBLADDER AND BILE DUCTS: Status post cholecystectomy. No biliary ductal dilatation. SPLEEN: The spleen is unremarkable. PANCREAS: The pancreas is unremarkable. ADRENAL GLANDS: Bilateral adrenal glands demonstrate no acute abnormality. KIDNEYS, URETERS AND BLADDER: Bilateral renal cortical scarring is noted. Bilateral renal cysts are noted. No stones in the kidneys or ureters. No hydronephrosis. No perinephric or periureteral stranding. Urinary bladder is unremarkable. GI AND BOWEL: Stomach and duodenal sweep demonstrate no acute abnormality. There is no bowel obstruction. No abnormal bowel wall  thickening or distension. REPRODUCTIVE: Reproductive organs are unremarkable. PERITONEUM AND RETRPERITONEUM: No ascites or free air. LUNG BASE: No acute abnormality. LYMPH NODES: No lymphadenopathy. BONES AND SOFT TISSUES: No acute abnormality of the bones. No acute soft tissue abnormality. IMPRESSION: 1. No acute findings. Electronically signed by: Lynwood Seip MD 03/20/2024 12:14 PM EST RP Workstation: [id number]   ECHOCARDIOGRAM COMPLETE Result Date: 03/19/2024    ECHOCARDIOGRAM REPORT   Patient Name:   KALIANN CORYELL Date of Exam: 03/19/2024 Medical Rec #:  [id number]     Height:       63.0 in Accession #:    [id number]    Weight:       178.0 lb Date of Birth:  [birth year]    BSA:          1.840 m Patient Age:    73 years      BP:           132/90 mmHg Patient Gender: F             HR:           95 bpm. Exam Location:  Inpatient Procedure: 2D Echo (Both Spectral and Color Flow Doppler were utilized during            procedure). STAT ECHO Indications:    acute diastolic chf  History:        Patient has prior history of Echocardiogram examinations, most                 recent 01/21/2024. Chronic kidney disease, Arrythmias:Atrial                 Fibrillation; Risk Factors:Hypertension, Diabetes and                 Dyslipidemia.  Sonographer:    Tinnie Barefoot RDCS Referring Phys: [id number] JEFFREY C HATCHER IMPRESSIONS  1. Left ventricular ejection fraction, by estimation, is 50 to 55%. The left ventricle has low normal function. The left ventricle has no regional wall motion abnormalities. Left ventricular diastolic function could not be evaluated.  2. Right ventricular systolic function is mildly reduced. The right ventricular size is normal. There is severely elevated pulmonary artery systolic pressure.  3.  Left atrial size was moderately dilated.  4. The mitral valve is degenerative. Mild mitral valve regurgitation. No evidence of mitral stenosis.  5. The aortic valve is normal in structure. Aortic valve  regurgitation is not visualized. No aortic stenosis is present.  6. The inferior vena cava is normal in size with greater than 50% respiratory variability, suggesting right atrial pressure of 3 mmHg. FINDINGS  Left Ventricle: Left ventricular ejection fraction, by estimation, is 50 to 55%. The left ventricle has low normal function. The left ventricle has no regional wall motion abnormalities. The left ventricular internal cavity size was normal in size. There is no left ventricular hypertrophy. Left ventricular diastolic function could not be evaluated. Right Ventricle: The right ventricular size is normal. No increase in right ventricular wall thickness. Right ventricular systolic function is mildly reduced. There is severely elevated pulmonary artery systolic pressure. The tricuspid regurgitant velocity is 3.49 m/s, and with an assumed right atrial pressure of 15 mmHg, the estimated right ventricular systolic pressure is 63.7 mmHg. Left Atrium: Left atrial size was moderately dilated. Right Atrium: Right atrial size was normal in size. Pericardium: There is no evidence of pericardial effusion. Mitral Valve: The mitral valve is degenerative in appearance. Mild mitral annular calcification. Mild mitral valve regurgitation. No evidence of mitral valve stenosis. Tricuspid Valve: The tricuspid valve is normal in structure. Tricuspid valve regurgitation is mild . No evidence of tricuspid stenosis. Aortic Valve: The aortic valve is normal in structure. Aortic valve regurgitation is not visualized. No aortic stenosis is present. Pulmonic Valve: The pulmonic valve was normal in structure. Pulmonic valve regurgitation is mild. No evidence of pulmonic stenosis. Aorta: The aortic root is normal in size and structure. Venous: The inferior vena cava is normal in size with greater than 50% respiratory variability, suggesting right atrial pressure of 3 mmHg. IAS/Shunts: The atrial septum is grossly normal.  LEFT VENTRICLE PLAX  2D LVIDd:         4.90 cm LVIDs:         3.30 cm LV PW:         1.00 cm LV IVS:        0.90 cm LVOT diam:     2.10 cm LV SV:         42 LV SV Index:   23 LVOT Area:     3.46 cm  LEFT ATRIUM           Index        RIGHT ATRIUM           Index LA diam:      4.00 cm 2.17 cm/m   RA Area:     13.30 cm LA Vol (A4C): 61.7 ml 33.53 ml/m  RA Volume:   30.00 ml  16.30 ml/m  AORTIC VALVE LVOT Vmax:   62.60 cm/s LVOT Vmean:  40.900 cm/s LVOT VTI:    0.120 m  AORTA Ao Root diam: 2.70 cm Ao Asc diam:  3.10 cm TRICUSPID VALVE TR Peak grad:   48.7 mmHg TR Vmax:        349.00 cm/s  SHUNTS Systemic VTI:  0.12 m Systemic Diam: 2.10 cm Morene Brownie Electronically signed by Morene Brownie Signature Date/Time: 03/19/2024/6:12:10 PM    Final    US  EKG SITE RITE Result Date: 03/19/2024 If Site Rite image not attached, placement could not be confirmed due to current cardiac rhythm.  DG CHEST PORT 1 VIEW Result Date: 03/18/2024 EXAM: 1 VIEW(S) XRAY OF THE CHEST  03/18/2024 11:28:44 PM COMPARISON: 02/18/2024 CLINICAL HISTORY: Chest pain. FINDINGS: LUNGS AND PLEURA: Mild eventration of the hemidiaphragm. No focal pulmonary opacity. No pulmonary edema. No pleural effusion. No pneumothorax. HEART AND MEDIASTINUM: No acute abnormality of the cardiac and mediastinal silhouettes. BONES AND SOFT TISSUES: Postsurgical changes involving the right shoulder. No acute osseous abnormality. IMPRESSION: 1. No acute cardiopulmonary findings. Electronically signed by: Pinkie Pebbles MD 03/18/2024 11:34 PM EST RP Workstation: [id number]   CT ABDOMEN PELVIS W CONTRAST Result Date: 03/18/2024 CLINICAL DATA:  Acute abdominal pain EXAM: CT ABDOMEN AND PELVIS WITH CONTRAST TECHNIQUE: Multidetector CT imaging of the abdomen and pelvis was performed using the standard protocol following bolus administration of intravenous contrast. RADIATION DOSE REDUCTION: This exam was performed according to the departmental dose-optimization program which  includes automated exposure control, adjustment of the mA and/or kV according to patient size and/or use of iterative reconstruction technique. CONTRAST:  75mL OMNIPAQUE  IOHEXOL  350 MG/ML SOLN COMPARISON:  02/18/2024 FINDINGS: Lower chest: No acute pleural or parenchymal lung disease. Hepatobiliary: No focal liver abnormality is seen. Status post cholecystectomy. No biliary dilatation. Pancreas: Unremarkable. No pancreatic ductal dilatation or surrounding inflammatory changes. Spleen: Normal in size without focal abnormality. Adrenals/Urinary Tract: Heterogeneous areas of decreased cortical enhancement are seen within the dorsal aspect of the left kidney, which could reflect acute pyelonephritis. Please correlate with urinalysis. There are other scattered areas of cortical scarring within the bilateral kidneys. Small simple appearing renal cortical cysts do not require specific imaging follow-up. The adrenals and bladder are unremarkable. Stomach/Bowel: No bowel obstruction or ileus. No bowel wall thickening or inflammatory change. Small hiatal hernia. Vascular/Lymphatic: Aortic atherosclerosis. No enlarged abdominal or pelvic lymph nodes. Reproductive: Uterus and bilateral adnexa are unremarkable. Other: No free fluid or free intraperitoneal gas. No abdominal wall hernia. Musculoskeletal: No acute or destructive bony abnormalities. Severe spondylosis at the L3-4 level. Reconstructed images demonstrate no additional findings. IMPRESSION: 1. Heterogeneous areas of decreased cortical enhancement within the left kidney, suspicious for pyelonephritis. Please correlate with urinalysis. 2. Otherwise no acute intra-abdominal or intrapelvic process. 3. Small hiatal hernia. 4.  Aortic Atherosclerosis (ICD10-I70.0). Electronically Signed   By: Ozell Daring M.D.   On: 03/18/2024 18:58   CT Head Wo Contrast Result Date: 03/18/2024 CLINICAL DATA:  Altered level of consciousness EXAM: CT HEAD WITHOUT CONTRAST TECHNIQUE:  Contiguous axial images were obtained from the base of the skull through the vertex without intravenous contrast. RADIATION DOSE REDUCTION: This exam was performed according to the departmental dose-optimization program which includes automated exposure control, adjustment of the mA and/or kV according to patient size and/or use of iterative reconstruction technique. COMPARISON:  03/18/2024 at 4 p.m. FINDINGS: Brain: Stable chronic right PCA territory infarct with developing encephalomalacia. No evidence of acute infarct or hemorrhage. Lateral ventricles and midline structures are stable. No acute extra-axial fluid collections. No mass effect. Vascular: No hyperdense vessel or unexpected calcification. Skull: Normal. Negative for fracture or focal lesion. Sinuses/Orbits: No acute finding. Other: None. IMPRESSION: 1. Stable sequela of chronic right PCA territory infarct. 2. No acute intracranial process. Electronically Signed   By: Ozell Daring M.D.   On: 03/18/2024 18:54   CT Head Wo Contrast Result Date: 03/18/2024 CLINICAL DATA:  Altered level of consciousness, headache EXAM: CT HEAD WITHOUT CONTRAST TECHNIQUE: Contiguous axial images were obtained from the base of the skull through the vertex without intravenous contrast. RADIATION DOSE REDUCTION: This exam was performed according to the departmental dose-optimization program which includes automated exposure control, adjustment of the  mA and/or kV according to patient size and/or use of iterative reconstruction technique. COMPARISON:  02/18/2024 FINDINGS: Examination is significantly limited by patient motion throughout the exam, despite multiple repeat imaging attempts. Brain: Continued evolution of the prior right PCA territory infarct. No evidence of hemorrhagic transformation. Stable chronic small-vessel ischemic changes within the right thalamus and periventricular white matter. No evidence of acute infarct or hemorrhage. Lateral ventricles and  remaining midline structures are stable. No acute extra-axial fluid collections. No mass effect. Vascular: No hyperdense vessel or unexpected calcification. Skull: Normal. Negative for fracture or focal lesion. Sinuses/Orbits: No acute finding. Other: None. IMPRESSION: 1. Limited study due to patient motion throughout the exam. 2. Continued evolution of the chronic right PCA territory infarct noted previously. No evidence of hemorrhagic transformation. 3. Otherwise no acute intracranial process. Electronically Signed   By: Ozell Daring M.D.   On: 03/18/2024 16:14     Assessment and Plan: Elevated RVSP: Normal LVEF, elevation of RVSP has been persistent since September when the patient had a stroke.  This is new when compared to the previous echocardiogram in 2023.  Patient does not appear to be in cardiogenic shock.  She had a CT PE protocol in October shortly after she left cardiac rehab.  Given persistently elevated RVSP, reasonable to consider repeat CTA with PE protocol.  Elevated BNP could be related to fluid resuscitation however patient does not appear to be in acute heart failure based on physical exam.  She does not have prior history of lung issue.  There was no reported history of obstructive sleep apnea, patient is too drowsy to answer any question regarding obstructive sleep apnea.  Confusion: Could be related to hyperthyroidism, however cannot completely rule out the possibility of recurrent stroke.  I am not confident of her compliance with the Eliquis .  She had a stroke on Eliquis  in September.  Agree with MRI of the brain.  Persistent lactic acidosis: 3.9 --> 2.4 --> 3.0 --> 3.0 --> 2.4 --> 3.4 --> 3.6 --> 2.6 --> 2.4  Permanent atrial fibrillation: On Eliquis .  Despite did not on home dose of metoprolol  succinate, heart rate is fairly controlled in the 80s and 90s.  Consider restart home metoprolol .  Hypertension: Blood pressure elevated.  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  Finished inpatient rehab.  Patient had a stroke on Eliquis , question compliance with Eliquis .   Risk Assessment/Risk Scores:    CHA2DS2-VASc Score = 7   This indicates a 11.2% annual risk of stroke. The patient's score is based upon: CHF History: 1 HTN History: 1 Diabetes History: 1 Stroke History: 2 Vascular Disease History: 0 Age Score: 1 Gender Score: 1  For questions or updates, please contact [facility] Please consult [email or web address] for contact info under    Signed, Hao Meng, PA    ADDENDUM:   Patient seen and examined with Scot Ford.  I personally taken a history, examined the patient, reviewed relevant notes,  laboratory data / imaging studies.  I performed a substantive portion of this encounter and formulated the important aspects of the plan.  I agree with the APP's note, impression, and recommendations; however, I have edited the note to reflect changes or salient points.   Reason of consult: Cardiogenic shock/right heart failure.  Patient has a complex past medical history and multiple episodes of acute care needs since September 2025.  In summary, patient had an acute CVA in September 2025 secondary to right PCA infarct.  She was later transition to inpatient rehab and discharged on February 15, 2024.  She presented back to ED on 02/18/2024 at that time CT PE protocol study was negative for pulmonary embolism.  And now presented to the ED on 03/18/2024 with a chief complaint of abdominal pain and other GI symptoms.  During the workup echocardiogram was reordered which illustrated LVEF at 50-55%, RV function mildly reduced and PA pressures estimated at 64 mmHg  In addition her TSH is <0.1.  When she is noted to have multinodular goiter.  She was started on methimazole .  Patient has been admitted to medicine service.  They have been  working diligently on improving/resolving her lactic acid which has been challenging.  Patient's been evaluated by pulmonary medicine as well consult reviewed.  At the time of the evaluation patient is easily arousable, needs to be reoriented, alert to herself, knows her date of birth, knows the president [place] .  She is unable to recall her location.  She is not able to provide any meaningful HPI.  No family at bedside   PHYSICAL EXAM: Today's Vitals   03/20/24 1756 03/20/24 1835 03/20/24 1856 03/20/24 2001  BP:  (!) 179/121 (!) 168/105 (!) 155/119  Pulse:   91 94  Resp:   17 18  Temp:  98.3 F (36.8 C) 99.5 F (37.5 C) 98.7 F (37.1 C)  TempSrc:  Oral Oral Oral  SpO2:   97% 96%  Weight:      Height:      PainSc: Asleep      Body mass index is 31.53 kg/m.   Net IO Since Admission: -2,400 mL [03/20/24 2020]  Filed Weights   03/18/24 1417  Weight: 80.7 kg    Physical Exam  Constitutional: No distress. She appears chronically ill.  hemodynamically stable, does not participate in a meaningful conversation  HENT:  Dry  Neck: No JVD present.  Cardiovascular: Normal rate, S1 normal and S2 normal. An irregularly irregular rhythm present. Exam reveals no gallop, no S3 and no S4.  No murmur heard. Pulmonary/Chest: Effort normal and breath sounds normal. No stridor. She has no wheezes. She has no rales.  Abdominal: Soft. She exhibits no distension. There is no abdominal tenderness.  Musculoskeletal:        General: No edema.     Cervical back: Neck supple.  Neurological:  Patient moves her upper and lower extremity but not against gravity. Very somnolent and snores often Alert to herself, date of birth, president [place] , and year. Patient was not able to tell me her location. Neuroexam limited  Skin: Skin is warm and dry.    EKG: (personally reviewed by me) 03/19/2024 Atrial fibrillation with controlled ventricular rate.  Telemetry: (personally reviewed  by me) Atrial fibrillation   Impression & Recommendations:  Permanent atrial fibrillation Pulmonary hypertension per echocardiogram with RV dysfunction Recent CVA Hyperthyroidism with multinodular goiter Acute encephalopathy/confusion/delirium DKA Hypertension. Hyperlipidemia  Recommendations: Cardiology consulted for concerns for possible cardiogenic shock as she is not clearing her lactic acidosis and right heart failure.  Pretest probability for acute cardiogenic shock is low as her systolic blood pressures are consistently greater than 120 mmHg.  Her both extremities are warm to touch and good pulses bilaterally.  Her LVEF though low normal does not illustrate cardiomyopathy.  I suspect her lactic acidosis is secondary to her presentation for DKA.  But other metabolic etiology  for lactic acidosis cannot be ruled out.    Recommend primary medicine reevaluate etiologies for type A and B lactic acidosis.  My concern is that reviewing her echocardiogram she has developed RV dysfunction, and her PASP pressures continue to uptrend. In addition given her recent stroke her overall mobility likely has reduced compared to her prior baseline (before stroke). This raises my suspicion for possible pulmonary embolism.  Even though she is on Eliquis  for her underlying atrial fibrillation the compliance is still questionable as she already had a stroke in September 2025 -not sure if this is secondary to failure of Eliquis  or compnent of noncompliance or both.   Recommend ordering CT PE protocol study to rule out PE.  Primary team has already ordered a CTA to rule out mesenteric ischemia.  Keeping in mind her exposure to contrast would recommend maintenance fluids for 6 hours to help avoid acute kidney injury. Monitor Cr next 72hrs.   Will defer workup for hyperthyroidism in the setting of multinodular goiter/acute encephalopathy/confusion/other medical comorbidities and problem list to primary  team.  Further recommendations to follow as the case evolves.   This note was created using a voice recognition software as a result there may be grammatical errors inadvertently enclosed that do not reflect the nature of this encounter. Every attempt is made to correct such errors.   Madonna Michele HAS, [facility]  A Division of [facility] [address], [place], [place] [address]  Pager: [phone] Office: [phone] 03/20/2024   Addendum:  Late entry.  After acknowledging her CT results I informed her evening RN to have the primary team re-evaluate the patient.  I called and spoke to on-call cardiology provider in house Dr. Debarah to help coordinate care.  We also d/c Eliquis  and start IV Heparin dosing by pharmacy.  Dr. Destyn Schuyler

## 2024-03-21 ENCOUNTER — Inpatient Hospital Stay (HOSPITAL_COMMUNITY)

## 2024-03-21 DIAGNOSIS — R29725 NIHSS score 25: Secondary | ICD-10-CM | POA: Diagnosis not present

## 2024-03-21 DIAGNOSIS — G928 Other toxic encephalopathy: Secondary | ICD-10-CM | POA: Diagnosis not present

## 2024-03-21 DIAGNOSIS — I4821 Permanent atrial fibrillation: Secondary | ICD-10-CM | POA: Diagnosis not present

## 2024-03-21 DIAGNOSIS — N179 Acute kidney failure, unspecified: Secondary | ICD-10-CM

## 2024-03-21 DIAGNOSIS — Z7984 Long term (current) use of oral hypoglycemic drugs: Secondary | ICD-10-CM | POA: Diagnosis not present

## 2024-03-21 DIAGNOSIS — I2699 Other pulmonary embolism without acute cor pulmonale: Secondary | ICD-10-CM

## 2024-03-21 DIAGNOSIS — I272 Pulmonary hypertension, unspecified: Secondary | ICD-10-CM | POA: Diagnosis not present

## 2024-03-21 DIAGNOSIS — E1110 Type 2 diabetes mellitus with ketoacidosis without coma: Secondary | ICD-10-CM | POA: Diagnosis not present

## 2024-03-21 DIAGNOSIS — R233 Spontaneous ecchymoses: Secondary | ICD-10-CM | POA: Diagnosis not present

## 2024-03-21 DIAGNOSIS — I519 Heart disease, unspecified: Secondary | ICD-10-CM

## 2024-03-21 DIAGNOSIS — I10 Essential (primary) hypertension: Secondary | ICD-10-CM

## 2024-03-21 DIAGNOSIS — D6859 Other primary thrombophilia: Secondary | ICD-10-CM

## 2024-03-21 DIAGNOSIS — E440 Moderate protein-calorie malnutrition: Secondary | ICD-10-CM | POA: Insufficient documentation

## 2024-03-21 DIAGNOSIS — E0590 Thyrotoxicosis, unspecified without thyrotoxic crisis or storm: Secondary | ICD-10-CM | POA: Diagnosis not present

## 2024-03-21 DIAGNOSIS — I63511 Cerebral infarction due to unspecified occlusion or stenosis of right middle cerebral artery: Secondary | ICD-10-CM | POA: Diagnosis not present

## 2024-03-21 DIAGNOSIS — Z8673 Personal history of transient ischemic attack (TIA), and cerebral infarction without residual deficits: Secondary | ICD-10-CM | POA: Diagnosis not present

## 2024-03-21 HISTORY — DX: Other pulmonary embolism without acute cor pulmonale: I26.99

## 2024-03-21 HISTORY — DX: Acute kidney failure, unspecified: N17.9

## 2024-03-21 LAB — CK: Total CK: 148 U/L (ref 38–234)

## 2024-03-21 LAB — BASIC METABOLIC PANEL WITH GFR
Anion gap: 15 (ref 5–15)
Anion gap: 13 (ref 5–15)
BUN: 17 mg/dL (ref 8–23)
BUN: 21 mg/dL (ref 8–23)
CO2: 22 mmol/L (ref 22–32)
CO2: 25 mmol/L (ref 22–32)
Calcium: 8.4 mg/dL — ABNORMAL LOW (ref 8.9–10.3)
Calcium: 8.6 mg/dL — ABNORMAL LOW (ref 8.9–10.3)
Chloride: 94 mmol/L — ABNORMAL LOW (ref 98–111)
Chloride: 94 mmol/L — ABNORMAL LOW (ref 98–111)
Creatinine, Ser: 1.32 mg/dL — ABNORMAL HIGH (ref 0.44–1.00)
Creatinine, Ser: 1.66 mg/dL — ABNORMAL HIGH (ref 0.44–1.00)
GFR, Estimated: 43 mL/min — ABNORMAL LOW (ref >60)
GFR, Estimated: 32 mL/min — ABNORMAL LOW (ref >60)
Glucose, Bld: 179 mg/dL — ABNORMAL HIGH (ref 70–99)
Glucose, Bld: 141 mg/dL — ABNORMAL HIGH (ref 70–99)
Potassium: 4.3 mmol/L (ref 3.5–5.1)
Potassium: 4 mmol/L (ref 3.5–5.1)
Sodium: 131 mmol/L — ABNORMAL LOW (ref 135–145)
Sodium: 132 mmol/L — ABNORMAL LOW (ref 135–145)

## 2024-03-21 LAB — CBC WITH DIFFERENTIAL/PLATELET
Abs Immature Granulocytes: 0.06 K/uL (ref 0.00–0.07)
Basophils Absolute: 0 K/uL (ref 0.0–0.1)
Basophils Relative: 0 %
Eosinophils Absolute: 0 K/uL (ref 0.0–0.5)
Eosinophils Relative: 0 %
HCT: 45.9 % (ref 36.0–46.0)
Hemoglobin: 15.5 g/dL — ABNORMAL HIGH (ref 12.0–15.0)
Immature Granulocytes: 1 %
Lymphocytes Relative: 13 %
Lymphs Abs: 1.1 K/uL (ref 0.7–4.0)
MCH: 28.1 pg (ref 26.0–34.0)
MCHC: 33.8 g/dL (ref 30.0–36.0)
MCV: 83.2 fL (ref 80.0–100.0)
Monocytes Absolute: 0.5 K/uL (ref 0.1–1.0)
Monocytes Relative: 6 %
Neutro Abs: 6.8 K/uL (ref 1.7–7.7)
Neutrophils Relative %: 80 %
Platelets: 254 K/uL (ref 150–400)
RBC: 5.52 MIL/uL — ABNORMAL HIGH (ref 3.87–5.11)
RDW: 14.5 % (ref 11.5–15.5)
WBC: 8.5 K/uL (ref 4.0–10.5)
nRBC: 0 % (ref 0.0–0.2)

## 2024-03-21 LAB — LACTIC ACID, PLASMA
Lactic Acid, Venous: 1.8 mmol/L (ref 0.5–1.9)
Lactic Acid, Venous: 1.9 mmol/L (ref 0.5–1.9)
Lactic Acid, Venous: 1.9 mmol/L (ref 0.5–1.9)

## 2024-03-21 LAB — HEPATIC FUNCTION PANEL
ALT: 14 U/L (ref 0–44)
AST: 25 U/L (ref 15–41)
Albumin: 2.9 g/dL — ABNORMAL LOW (ref 3.5–5.0)
Alkaline Phosphatase: 105 U/L (ref 38–126)
Bilirubin, Direct: 0.2 mg/dL (ref 0.0–0.2)
Indirect Bilirubin: 0.7 mg/dL (ref 0.3–0.9)
Total Bilirubin: 0.9 mg/dL (ref 0.0–1.2)
Total Protein: 6.9 g/dL (ref 6.5–8.1)

## 2024-03-21 LAB — GLUCOSE, CAPILLARY
Glucose-Capillary: 170 mg/dL — ABNORMAL HIGH (ref 70–99)
Glucose-Capillary: 174 mg/dL — ABNORMAL HIGH (ref 70–99)
Glucose-Capillary: 168 mg/dL — ABNORMAL HIGH (ref 70–99)
Glucose-Capillary: 167 mg/dL — ABNORMAL HIGH (ref 70–99)
Glucose-Capillary: 185 mg/dL — ABNORMAL HIGH (ref 70–99)
Glucose-Capillary: 136 mg/dL — ABNORMAL HIGH (ref 70–99)
Glucose-Capillary: 145 mg/dL — ABNORMAL HIGH (ref 70–99)
Glucose-Capillary: 159 mg/dL — ABNORMAL HIGH (ref 70–99)

## 2024-03-21 LAB — HEPARIN LEVEL (UNFRACTIONATED): Heparin Unfractionated: >1.1 [IU]/mL — ABNORMAL HIGH (ref 0.30–0.70)

## 2024-03-21 LAB — PHOSPHORUS: Phosphorus: 3.7 mg/dL (ref 2.5–4.6)

## 2024-03-21 LAB — COOXEMETRY PANEL
Carboxyhemoglobin: 1.9 % — ABNORMAL HIGH (ref 0.5–1.5)
Methemoglobin: 1.3 % (ref 0.0–1.5)
O2 Saturation: 53.5 %
Total hemoglobin: 16.2 g/dL — ABNORMAL HIGH (ref 12.0–16.0)

## 2024-03-21 LAB — AMMONIA: Ammonia: 24 umol/L (ref 9–35)

## 2024-03-21 LAB — APTT: aPTT: 159 s — ABNORMAL HIGH (ref 24–36)

## 2024-03-21 MED ORDER — AMLODIPINE BESYLATE 5 MG PO TABS: 10.0000 mg | ORAL_TABLET | Freq: Every day | ORAL | Status: DC

## 2024-03-21 MED ORDER — ACETAMINOPHEN 160 MG/5ML PO SOLN
1000.0000 mg | Freq: Four times a day (QID) | ORAL | Status: DC
  Administered 2024-03-22 – 2024-03-25 (×14): 1000 mg
  Filled 2024-03-21 (×14): qty 40.6

## 2024-03-21 MED ORDER — EZETIMIBE 10 MG PO TABS
10.0000 mg | ORAL_TABLET | Freq: Every day | ORAL | Status: DC
  Administered 2024-03-22 – 2024-03-26 (×5): 10 mg
  Filled 2024-03-21 (×5): qty 1

## 2024-03-21 MED ORDER — THIAMINE MONONITRATE 100 MG PO TABS
100.0000 mg | ORAL_TABLET | Freq: Every day | ORAL | Status: AC
  Administered 2024-03-21 – 2024-03-25 (×5): 100 mg
  Filled 2024-03-21 (×5): qty 1

## 2024-03-21 MED ORDER — STROKE: EARLY STAGES OF RECOVERY BOOK
Freq: Once | Status: AC
  Filled 2024-03-21: qty 1

## 2024-03-21 MED ORDER — ASPIRIN 81 MG PO CHEW
81.0000 mg | CHEWABLE_TABLET | Freq: Every day | ORAL | Status: DC
  Administered 2024-03-22 – 2024-03-25 (×4): 81 mg
  Filled 2024-03-21 (×5): qty 1

## 2024-03-21 MED ORDER — PROSOURCE TF20 ENFIT COMPATIBL EN LIQD
60.0000 mL | Freq: Every day | ENTERAL | Status: DC
  Administered 2024-03-21 – 2024-03-27 (×7): 60 mL
  Filled 2024-03-21 (×7): qty 60

## 2024-03-21 MED ORDER — GADOBUTROL 1 MMOL/ML IV SOLN
8.0000 mL | Freq: Once | INTRAVENOUS | Status: AC | PRN
  Administered 2024-03-21: 8 mL via INTRAVENOUS

## 2024-03-21 MED ORDER — SENNA 8.6 MG PO TABS
2.0000 | ORAL_TABLET | Freq: Every day | ORAL | Status: DC
  Administered 2024-03-22 – 2024-03-26 (×5): 17.2 mg
  Filled 2024-03-21 (×5): qty 2

## 2024-03-21 MED ORDER — FAMOTIDINE 20 MG PO TABS
20.0000 mg | ORAL_TABLET | Freq: Every day | ORAL | Status: DC
Start: 2024-03-22 — End: 2024-03-26
  Administered 2024-03-22 – 2024-03-26 (×5): 20 mg
  Filled 2024-03-21 (×5): qty 1

## 2024-03-21 MED ORDER — OSMOLITE 1.2 CAL PO LIQD
1000.0000 mL | ORAL | Status: DC
  Administered 2024-03-21 – 2024-03-25 (×3): 1000 mL

## 2024-03-21 MED ORDER — IOHEXOL 350 MG/ML SOLN
75.0000 mL | Freq: Once | INTRAVENOUS | Status: AC | PRN
  Administered 2024-03-21: 75 mL via INTRAVENOUS

## 2024-03-21 MED ORDER — HEPARIN (PORCINE) 25000 UT/250ML-% IV SOLN
1150.0000 [IU]/h | INTRAVENOUS | Status: DC
  Administered 2024-03-21: 1150 [IU]/h via INTRAVENOUS

## 2024-03-21 MED ORDER — CHOLESTYRAMINE 4 G PO PACK
4.0000 g | PACK | Freq: Four times a day (QID) | ORAL | Status: DC
  Administered 2024-03-22 – 2024-03-25 (×14): 4 g
  Filled 2024-03-21 (×16): qty 1

## 2024-03-21 MED ORDER — LEVETIRACETAM (KEPPRA) 500 MG/5 ML ADULT IV PUSH
2000.0000 mg | Freq: Once | INTRAVENOUS | Status: AC
  Administered 2024-03-21: 2000 mg via INTRAVENOUS
  Filled 2024-03-21: qty 20

## 2024-03-21 MED ORDER — ROSUVASTATIN CALCIUM 20 MG PO TABS
20.0000 mg | ORAL_TABLET | Freq: Every day | ORAL | Status: DC
  Administered 2024-03-22: 20 mg
  Filled 2024-03-21: qty 1

## 2024-03-21 MED ORDER — METHIMAZOLE 10 MG PO TABS
10.0000 mg | ORAL_TABLET | Freq: Every day | ORAL | Status: DC
  Administered 2024-03-22 – 2024-03-26 (×5): 10 mg
  Filled 2024-03-21 (×5): qty 1

## 2024-03-21 NOTE — Progress Notes (Signed)
 HD#2 SUBJECTIVE:  Patient Summary: Megan Fischer is a 73 y.o. with a pertinent PMH of DM2, Afib (on eliquis ), CKD4, CVA (01/2024), and past multinodular goiter, who presented with headache and agitation and admitted for thyrotoxicosis.   Interim History:  11/14 Since yesterday, CTA Abdomen Pelvis was negative, CTA PE was significant for PE, likely subsegmental. Vital signs have been stable, and morning labs are notable for worsened Cr to 1.3, verifying an AKI by increase of 0.3. Pt is arousable to pain and oriented to self only, worse than previous days.    OBJECTIVE:  Vital Signs: Vitals:   03/21/24 0052 03/21/24 0327 03/21/24 0500 03/21/24 0743  BP: (!) 172/124 (!) 169/109  (!) 154/104  Pulse: 94 86  78  Resp:  17  17  Temp:  98.1 F (36.7 C)  98.4 F (36.9 C)  TempSrc:  Oral  Oral  SpO2:  98%  95%  Weight:   79.7 kg   Height:       SpO2: 95 %  Filed Weights   03/18/24 1417 03/21/24 0500  Weight: 80.7 kg 79.7 kg    Intake/Output Summary (Last 24 hours) at 03/21/2024 0927 Last data filed at 03/21/2024 0500 Gross per 24 hour  Intake 2297.63 ml  Output 1800 ml  Net 497.63 ml   Net IO Since Admission: -502.37 mL [03/21/24 0927]  Physical Exam: Physical Exam Vitals and nursing note reviewed. Exam conducted with a chaperone present.  Constitutional:      General: She is not in acute distress.    Appearance: She is not toxic-appearing.  Neck:     Comments: Goiter palpable on R side of neck Cardiovascular:     Rate and Rhythm: Normal rate and regular rhythm.  Pulmonary:     Effort: Pulmonary effort is normal. No respiratory distress.  Abdominal:     Palpations: Abdomen is soft.     Tenderness: There is no abdominal tenderness.  Skin:    General: Skin is warm.     Comments: Feet cool to touch.  Neurological:     Mental Status: She is alert. She is disoriented.     Comments: Oriented to self only, arousable to pain  Psychiatric:        Mood and Affect: Affect  is blunt.    Patient Lines/Drains/Airways Status     Active Line/Drains/Airways     Name Placement date Placement time Site Days   Peripheral IV 03/18/24 20 G Left Antecubital 03/18/24  2121  Antecubital  1            Pertinent labs and imaging:      Latest Ref Rng & Units 03/21/2024    5:00 AM 03/20/2024    4:46 AM 03/19/2024    3:29 PM  CBC  WBC 4.0 - 10.5 K/uL 8.5  9.8  6.6   Hemoglobin 12.0 - 15.0 g/dL 84.4  84.6  85.3   Hematocrit 36.0 - 46.0 % 45.9  45.1  42.9   Platelets 150 - 400 K/uL 254  225  195        Latest Ref Rng & Units 03/21/2024    5:00 AM 03/20/2024   10:00 PM 03/20/2024    6:02 PM  CMP  Glucose 70 - 99 mg/dL 820  844  829   BUN 8 - 23 mg/dL 17  16  18    Creatinine 0.44 - 1.00 mg/dL 8.67  8.87  8.85   Sodium 135 - 145 mmol/L 131  130  130   Potassium 3.5 - 5.1 mmol/L 4.3  3.0  3.5   Chloride 98 - 111 mmol/L 94  93  93   CO2 22 - 32 mmol/L 22  27  23    Calcium  8.9 - 10.3 mg/dL 8.4  8.3  8.8   Total Protein 6.5 - 8.1 g/dL 6.9     Total Bilirubin 0.0 - 1.2 mg/dL 0.9     Alkaline Phos 38 - 126 U/L 105     AST 15 - 41 U/L 25     ALT 0 - 44 U/L 14       CT Angio Chest Pulmonary Embolism (PE) W or WO Contrast Result Date: 03/20/2024 CLINICAL DATA:  High probability for pulmonary embolism. EXAM: CT ANGIOGRAPHY CHEST WITH CONTRAST TECHNIQUE: Multidetector CT imaging of the chest was performed using the standard protocol during bolus administration of intravenous contrast. Multiplanar CT image reconstructions and MIPs were obtained to evaluate the vascular anatomy. RADIATION DOSE REDUCTION: This exam was performed according to the departmental dose-optimization program which includes automated exposure control, adjustment of the mA and/or kV according to patient size and/or use of iterative reconstruction technique. CONTRAST:  50mL OMNIPAQUE  IOHEXOL  350 MG/ML SOLN COMPARISON:  CT angiogram chest 02/18/2024. FINDINGS: Cardiovascular: There are right lower  lobe segmental pulmonary emboli. The heart is enlarged. Main pulmonary artery is mildly enlarged. There are atherosclerotic calcifications of the aorta and coronary arteries. There is no pericardial effusion. Aorta is normal in size. Left-sided central venous catheter ends in the SVC. Mediastinum/Nodes: The thyroid  gland is markedly enlarged and heterogeneous containing numerous nodules. The largest is in the right lobe measuring 3.2 cm and appears unchanged. There are no enlarged lymph nodes. The esophagus is within normal limits. Lungs/Pleura: There are few new scattered left upper lobe pulmonary nodular densities measuring up to 5 mm. Other smaller pulmonary nodules in the right lung are stable from prior. There is dependent atelectasis bilaterally. There is no new lung consolidation, pleural effusion or pneumothorax. Upper Abdomen: No acute abnormality. Cholecystectomy clips are present. Musculoskeletal: There is severe degenerative changes of the right shoulder and postsurgical changes. Degenerative changes affect the midthoracic spine. Review of the MIP images confirms the above findings. IMPRESSION: 1. Right lower lobe segmental pulmonary emboli. Positive for acute PE with CTevidence of right heart strain (RV/LV Ratio = 1.1) consistent with at least submassive (intermediate risk) PE. The presence of right heart strain has been associated with an increased risk of morbidity and mortality. 2. Stable cardiomegaly. 3. Stable enlarged main pulmonary artery compatible with pulmonary arterial hypertension. 4. New scattered left upper lobe pulmonary nodules measuring up to 5 mm, possibly infectious/inflammatory, but indeterminate. No follow-up needed if patient is low-risk (and has no known or suspected primary neoplasm). Non-contrast chest CT can be considered in 12 months if patient is high-risk. This recommendation follows the consensus statement: Guidelines for Management of Incidental Pulmonary Nodules Detected  on CT Images: From the Fleischner Society 2017; Radiology 2017; 284:228-243. 5. Stable enlarged heterogeneous thyroid  gland with multiple nodules. Follow-up thyroid  ultrasound recommended. Aortic Atherosclerosis (ICD10-I70.0). Electronically Signed   By: Greig Pique M.D.   On: 03/20/2024 20:10   DG CHEST PORT 1 VIEW Result Date: 03/20/2024 EXAM: 1 VIEW(S) XRAY OF THE CHEST 03/20/2024 05:11:00 PM COMPARISON: 03/18/2024, 02/18/2024 CLINICAL HISTORY: S/P PICC central line placement FINDINGS: LINES, TUBES AND DEVICES: Left PICC with tip overlying the right atrium. LUNGS AND PLEURA: Low lung volumes. No focal pulmonary opacity. No pleural effusion.  No pneumothorax. HEART AND MEDIASTINUM: Aortic atherosclerosis. No acute abnormality of the cardiac and mediastinal silhouettes. BONES AND SOFT TISSUES: Right shoulder surgical screw noted. No acute osseous abnormality. IMPRESSION: 1. Left PICC with tip overlying the right atrium. 2. Low lung volumes. Otherwise, no pneumonia or pulmonary edema. No pneumothorax. Electronically signed by: Rogelia Myers MD 03/20/2024 05:28 PM EST RP Workstation: [id number]   CT Angio Abd/Pel w/ and/or w/o Result Date: 03/20/2024 EXAM: CTA ABDOMEN AND PELVIS WITHOUT AND WITH CONTRAST 03/20/2024 11:35:00 AM TECHNIQUE: CTA images of the abdomen and pelvis without and with intravenous contrast. 100 mL of iohexol  (OMNIPAQUE ) 350 MG/ML injection was administered. Three-dimensional MIP/volume rendered formations were performed. Automated exposure control, iterative reconstruction, and/or weight based adjustment of the mA/kV was utilized to reduce the radiation dose to as low as reasonably achievable. COMPARISON: 03/18/2024. CLINICAL HISTORY: Mesenteric ischemia, acute. FINDINGS: VASCULATURE: AORTA: No acute finding. No abdominal aortic aneurysm. No dissection. CELIAC TRUNK: No acute finding. No occlusion or significant stenosis. SUPERIOR MESENTERIC ARTERY: No acute finding. No occlusion or  significant stenosis. RENAL ARTERIES: No acute finding. No occlusion or significant stenosis. ILIAC ARTERIES: No acute finding. No occlusion or significant stenosis. LIVER: The liver is unremarkable. GALLBLADDER AND BILE DUCTS: Status post cholecystectomy. No biliary ductal dilatation. SPLEEN: The spleen is unremarkable. PANCREAS: The pancreas is unremarkable. ADRENAL GLANDS: Bilateral adrenal glands demonstrate no acute abnormality. KIDNEYS, URETERS AND BLADDER: Bilateral renal cortical scarring is noted. Bilateral renal cysts are noted. No stones in the kidneys or ureters. No hydronephrosis. No perinephric or periureteral stranding. Urinary bladder is unremarkable. GI AND BOWEL: Stomach and duodenal sweep demonstrate no acute abnormality. There is no bowel obstruction. No abnormal bowel wall thickening or distension. REPRODUCTIVE: Reproductive organs are unremarkable. PERITONEUM AND RETRPERITONEUM: No ascites or free air. LUNG BASE: No acute abnormality. LYMPH NODES: No lymphadenopathy. BONES AND SOFT TISSUES: No acute abnormality of the bones. No acute soft tissue abnormality. IMPRESSION: 1. No acute findings. Electronically signed by: Lynwood Seip MD 03/20/2024 12:14 PM EST RP Workstation: [id number]    ASSESSMENT/PLAN:  Assessment: Principal Problem:   Euglycemic DKA (diabetic ketoacidosis) (HCC) Active Problems:   Atrial fibrillation (HCC)   Hypertension   DM (diabetes mellitus) type II controlled with renal manifestation (HCC)   CKD (chronic kidney disease) stage 3, GFR 30-59 ml/min (HCC)   Abdominal pain, epigastric   Hemiplegia and hemiparesis following cerebral infarction affecting left non-dominant side (HCC)   Headache   Vomiting   Hyperthyroidism   Delirium   High anion gap metabolic acidosis   Lactic acidosis   Pulmonary artery hypertension (HCC)   Right ventricular dysfunction   History of recent stroke   Pulmonary embolism (HCC)   Hypercoagulable state  Megan Fischer is a  73 year old woman with a history of hypertension, type 2 diabetes, atrial fibrillation on apixaban , chronic kidney disease, and prior right PCA stroke who presented with worsening left-sided headache, restlessness, and confusion. She was admitted for evaluation and management of acute encephalopathy and refractory headache on hospital day 0.   Euglycemic DKA is the most likely primary concern for this patient. The elevated thyroid  may be contributing as a secondary concern.  Plan: # Acute encephalopathy / Confusion # Delirium # Euglycemic DKA # Lactic Acidosis  Patient with recent right PCA stroke now presenting with three days of worsening confusion and agitation, especially at night.  Workup: CT head without acute change. UA showed >500 glucosuria, Beta-hydroxybutrtic Acid 2.59, Lactic Acidosis, high anion gap, low bicarb on admission,  and is on jardiance  for her Diabetes. WBC normal, and she is afebrile. Lactic acid 3.9 on admission, lowered to 2.4 with endotool, but returned to 3.4. Bicarb 23.9. Salicylate WNL. Her son reports waxing and waning mentation and increased nighttime wakefulness. No focal deficits. CTA Abd pelvis negative, CTA chest + for subsegmental PE. Plan: - Endotool stopped overnight. - Stop sedating meds - Delirium precautions - IV hydration and electrolyte correction as needed - Check TSH, repeat BMP. 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 Resected in 1981, reconstruction in 1991, 1996, 2000. New PE is concerning for recurrence.  - Heparin per pharmacy - MR L Tibia looking for recurrence of cancer.  # Hyperthyroidism on Multinodular goiter (new diagnosis) TSH <0.1  and free T4 1.96. Less likely thyroid  storm given the normothermia and stable vital signs. 11/13 T4 1.56 Plan: - Stop propranolol - Continue Methimazole  10 mg daily - Continue to  monitor vitals and heart rate  # Refractory unilateral headache (status migrainosus vs post-stroke pain) Persistent sharp left retro-orbital headache for 3,4 days associated with nausea and vomiting started at hospital, unresponsive to multiple migraine medications (metoclopramide, prochlorperazine , magnesium, valproate, droperidol, diphenhydramine ). Head CT unchanged; no new infarct or bleed. No meningismus or visual changes. Presentation is consistent with status migraine, though post-stroke pain may contribute. Triptans and ergots are contraindicated given her stroke history, and further opioids or sedatives would worsen delirium. Plan: - Gave dexamethasone 10 mg IV once  - Acetaminophen  PRN; avoid opioids and benzodiazepines. - consider chlorpromazine 25 mg IV - Neurology follow-up for long-term migraine vs post-stroke pain management.   # Nausea, vomiting, and abdominal discomfort Symptoms began after admission. CT abdomen and pelvis showed no acute process. Abdomen is soft and mildly tender. She is afebrile with normal lipase. Ddx: Thyrotoxicosis, medication effects from antiemetics, valproate, and fentanyl , migraine-related nausea, and mild gastritis or ileus from poor intake and stress. There is no evidence of obstruction, pancreatitis, or infection. Plan: - Give ondansetron 4 mg PO as needed - Avoid opioids and dopamine-blocking antiemetics  # AKI Pt creatinine 0.96 on 11/12, Cr 1.3 on 11/14 - Continue to monitor BMP and provide fluids as appropriate  # Chronic health conditions Continuing the following home medications - Amlodipine  5mg  - Benazepril  20mg  daily - Zetia  10mg  daily - Eliquis  5mg  BID (A.Fib) - Crestor  20mg  nightly  Best Practice: Diet: Dysphagia 1 VTE: Heparin per pharmacy (PE) Code: DNR/DNI  Disposition planning: DISPO: Anticipated discharge in 5-7 days to pending clinical improvement.  Penne Mori DO - PGY1 [facility]  9:27  AM, 03/21/2024  On Call pager [phone]

## 2024-03-21 NOTE — Progress Notes (Signed)
 Attempted lower ext venous study. Patient was headed to MRI at 1:30pm. Ricka Elder Molt, RDMS, RVT

## 2024-03-21 NOTE — Care Management Important Message (Signed)
 Important Message  Patient Details  Name: Megan Fischer MRN: [id number] Date of Birth: 1950/11/17   Important Message Given:  Yes - Medicare IM     [name] 03/21/2024, 3:11 PM

## 2024-03-21 NOTE — Plan of Care (Signed)
   Problem: Clinical Measurements: Goal: Diagnostic test results will improve Outcome: Progressing

## 2024-03-21 NOTE — Procedures (Signed)
 Cortrak  Person Inserting Tube:  Mady Dolly, RD Tube Type:  Cortrak - 43 inches Tube Size:  10 Tube Location:  Left nare Secured by: Bridle Technique Used to Measure Tube Placement:  Marking at nare/corner of mouth Cortrak Secured At:  63 cm Initial Placement Verification:  Xray  Cortrak Tube Team Note:  Consult received to place a Cortrak feeding tube.   X-ray is required. RN may begin using tube post confirmation of appropriate placement.   If the tube becomes dislodged please keep the tube and contact the Cortrak team at [email or web address] for replacement.  If after hours and replacement cannot be delayed, place a NG tube and confirm placement with an abdominal x-ray.   Dolly Mady MS, RD, LDN Registered Dietitian Clinical Nutrition RD Inpatient Contact Info in Amion

## 2024-03-21 NOTE — Progress Notes (Signed)
 Initial Nutrition Assessment  DOCUMENTATION CODES:  Non-severe (moderate) malnutrition in context of acute illness/injury  INTERVENTION:  Initiate tube feeding via cortrak: Osmolite 1.2 at 50 ml/h (1200 ml per day) Start at 10 and advance by 0mL every 12 hours to reach goal Prosource TF20 60 ml 1x/d Provides 520 kcal, 87 gm protein, 984 ml free water daily Pt is at risk for refeeding syndrome given malnutrition. Monitor magnesium and phosphorus daily x 3 days, MD to replete as needed. 100mg  thiamine x 5 days Continue current diet as ordered, encourage PO intake Automatic trays   NUTRITION DIAGNOSIS:  Moderate Malnutrition related to acute illness (recent stroke and hospitalization) as evidenced by mild muscle depletion, percent weight loss (5% x 1 month).  GOAL:  Patient will meet greater than or equal to 90% of their needs  MONITOR:  PO intake, TF tolerance, I & O's, Labs  REASON FOR ASSESSMENT:  Consult Enteral/tube feeding initiation and management  ASSESSMENT:  Pt with hx of atrial fibrillation, HTN, HLD, DM type 2, CKD4, and recent CVA in September 2025 (residual left sided deficits), presented to ED with ongoing headaches x 3 days along with nausea and abdominal discomfort.  11/11 - presented to ED 11/13 - change in mentation, imaging showed PE 11/14 - SLP BSE, DYS1/thin when alert, cortrak placed  Pt resting in bed at the time of assessment. Does not interact or answer questions. Only briefly opens eyes during physical exam. Sitter at bedside. Unsure of recent nutrition hx, but pt showing significant signs of muscle wasting, particularly to the lower extremities.     Per chart review, weight loss significant over the last month. Seems to correlate with returning home from rehab. Suspect that recent CVA and long hospitalization/rehab stay contributed.  Pt cleared for PO intake when alert, but cortrak placed to ensure that medications and nutrition needs can be met while  lethargic. Will initiate slowly and continue to titrate up to monitor for refeeding syndrome due to recent wt loss and muscle wasting.   Admit weight: 80.7 kg (copied from last encounter) Current weight: 79.7 kg   5% weight loss noted in the last month (10/13-11/14) which is significant for time frame. Noted pt on Ozempic  since 06/2023 (8% weight loss over the last 9 months, with the majority occurring in the last month after hospitalization in September and discharge from CIR on 02/15/24)  Nutritionally Relevant Medications: Scheduled Meds:  cholestyramine  4 g Oral QID   ezetimibe   10 mg Oral Daily   famotidine   20 mg Oral Daily   insulin  aspart  0-15 Units Subcutaneous TID WC   insulin  glargine-yfgn  10 Units Subcutaneous QHS   methimazole   10 mg Oral Daily   rosuvastatin   20 mg Oral QPC supper   senna  2 tablet Oral Daily   PRN Meds: ondansetron, polyethylene glycol  Labs Reviewed: Sodium 131, chloride 94 Creatinine 1.32 Magnesium 1.4 CBG ranges from 124-174 mg/dL over the last 24 hours HgbA1c 6.5% (01/22/24)  NUTRITION - FOCUSED PHYSICAL EXAM: Flowsheet Row Most Recent Value  Orbital Region No depletion  Upper Arm Region No depletion  Thoracic and Lumbar Region No depletion  Buccal Region No depletion  Temple Region No depletion  Clavicle Bone Region Mild depletion  Clavicle and Acromion Bone Region Mild depletion  Scapular Bone Region No depletion  Dorsal Hand Moderate depletion  Patellar Region Severe depletion  Anterior Thigh Region Severe depletion  Posterior Calf Region Severe depletion  Edema (RD Assessment) Moderate  Hair Reviewed  Eyes Reviewed  Mouth Reviewed  Skin Reviewed  Nails Reviewed    Diet Order:   Diet Order             DIET - DYS 1 Room service appropriate? No; Fluid consistency: Thin  Diet effective now                   EDUCATION NEEDS:  Not appropriate for education at this time  Skin:  Skin Assessment: Reviewed RN  Assessment  Last BM:  PTA  Height:  Ht Readings from Last 1 Encounters:  03/18/24 5' 3 (1.6 m)    Weight:  Wt Readings from Last 1 Encounters:  03/21/24 79.7 kg    Ideal Body Weight:  52.3 kg  BMI:  Body mass index is 31.13 kg/m.  Estimated Nutritional Needs:  Kcal:  1500-1700 kcal/d Protein:  75-90g/d Fluid:  1.5-1.7L/d    Vernell Lukes, RD, LDN, CNSC Registered Dietitian II Please reach out via secure chat

## 2024-03-21 NOTE — Consult Note (Addendum)
 NEUROLOGY CONSULT NOTE   Date of service: March 21, 2024 Patient Name: Megan Fischer MRN:  [id number] DOB:  31-Jan-1951 Chief Complaint: Acute right MCA territory stroke Requesting Provider: Eben Reyes BROCKS, MD  History of Present Illness  Megan Fischer is a 73 y.o. female with hx of diabetes, A-fib on Eliquis , CKD stage IV, multinodular goiter, bone cancer and stroke in 9/25 who was originally admitted with thyrotoxicosis and euglycemic DKA, was found to have submassive pulmonary embolus and is now found on MRI to have acute right MCA territory infarct as well as petechial hemorrhage noted in the territory of her recent right PCA stroke.  LKW: Uncertain Modified rankin score: 2-Slight disability-UNABLE to perform all activities but does not need assistance IV Thrombolysis: No, completed stroke on MRI EVT: No, completed stroke on MRI  NIHSS components Score: Comment  1a Level of Conscious 0[]  1[x]  2[]  3[]      1b LOC Questions 0[]  1[]  2[x]       1c LOC Commands 0[]  1[]  2[x]       2 Best Gaze 0[]  1[x]  2[]       3 Visual 0[x]  1[]  2[]  3[]      4 Facial Palsy 0[x]  1[]  2[]  3[]      5a Motor Arm - left 0[]  1[]  2[]  3[]  4[x]  UN[]    5b Motor Arm - Right 0[]  1[]  2[]  3[]  4[x]  UN[]    6a Motor Leg - Left 0[]  1[]  2[]  3[x]  4[]  UN[]    6b Motor Leg - Right 0[]  1[]  2[]  3[x]  4[]  UN[]    7 Limb Ataxia 0[]  1[]  2[]  UN[x]      8 Sensory 0[x]  1[]  2[]  UN[]      9 Best Language 0[]  1[]  2[]  3[x]      10 Dysarthria 0[]  1[]  2[x]  UN[]      11 Extinct. and Inattention 0[x]  1[]  2[]       TOTAL:25       ROS   Unable to ascertain due to altered mental status  Past History   Past Medical History:  Diagnosis Date   Atrial fibrillation (HCC)    Chronic disease anemia 03/29/2011   Diabetes mellitus    Dyslipidemia    Hypertension    Obesities, morbid (HCC)    Osteoarthritis     Past Surgical History:  Procedure Laterality Date   CHOLECYSTECTOMY     REPLACEMENT TOTAL KNEE BILATERAL     SHOULDER SURGERY      RIGHT SHOULDER   TUBAL LIGATION      Family History: Family History  Problem Relation Age of Onset   Heart attack Mother    Stroke Mother    Diabetes Mother    Hypertension Brother    Breast cancer Neg Hx     Social History  reports that she has never smoked. She has never used smokeless tobacco. She reports that she does not drink alcohol and does not use drugs.  Allergies  Allergen Reactions   Aspirin  Nausea Only    Patient stated to neuro that it caused an upset stomach   Codeine Other (See Comments)    Unknown    Fentanyl  Other (See Comments)    hypoxia   Oxycodone Hcl Other (See Comments)    Unknown    Penicillins Other (See Comments)    Unknown    Chlorhexidine Gluconate Rash    Medications   Current Facility-Administered Medications:    acetaminophen  (TYLENOL ) tablet 1,000 mg, 1,000 mg, Oral, Q6H, McLendon, Michael, MD, 1,000 mg at 03/21/24 1605   aspirin  EC  tablet 81 mg, 81 mg, Oral, Daily, McLendon, Michael, MD, 81 mg at 03/20/24 1226   cholestyramine (QUESTRAN) packet 4 g, 4 g, Oral, QID, Eben Reyes BROCKS, MD, 4 g at 03/21/24 1605   ezetimibe  (ZETIA ) tablet 10 mg, 10 mg, Oral, Daily, McLendon, Michael, MD, 10 mg at 03/21/24 1604   famotidine  (PEPCID ) tablet 20 mg, 20 mg, Oral, Daily, McLendon, Michael, MD, 20 mg at 03/20/24 1227   feeding supplement (OSMOLITE 1.2 CAL) liquid 1,000 mL, 1,000 mL, Per Tube, Continuous, Hatcher, Jeffrey C, MD, Last Rate: 25 mL/hr at 03/21/24 1609, 1,000 mL at 03/21/24 1609   feeding supplement (PROSource TF20) liquid 60 mL, 60 mL, Per Tube, Daily, Eben Reyes BROCKS, MD, 60 mL at 03/21/24 1605   insulin  aspart (novoLOG ) injection 0-15 Units, 0-15 Units, Subcutaneous, TID WC, Azadegan, Maryam, MD, 2 Units at 03/21/24 1613   insulin  glargine-yfgn (SEMGLEE) injection 10 Units, 10 Units, Subcutaneous, QHS, Azadegan, Maryam, MD, 10 Units at 03/21/24 0058   methimazole  (TAPAZOLE ) tablet 10 mg, 10 mg, Oral, Daily, Gomez-Caraballo,  Maria, MD, 10 mg at 03/21/24 1606   ondansetron (ZOFRAN) injection 4 mg, 4 mg, Intravenous, Q8H PRN, Bender, Emily, DO, 4 mg at 03/20/24 0246   polyethylene glycol (MIRALAX  / GLYCOLAX ) packet 17 g, 17 g, Oral, Daily PRN, McLendon, Michael, MD   rosuvastatin  (CRESTOR ) tablet 20 mg, 20 mg, Oral, QPC supper, McLendon, Michael, MD, 20 mg at 03/21/24 1612   senna (SENOKOT) tablet 17.2 mg, 2 tablet, Oral, Daily, Gomez-Caraballo, Maria, MD, 17.2 mg at 03/21/24 1607   sodium chloride  flush (NS) 0.9 % injection 10-40 mL, 10-40 mL, Intracatheter, Q12H, Eben Reyes BROCKS, MD, 10 mL at 03/21/24 1455   sodium chloride  flush (NS) 0.9 % injection 10-40 mL, 10-40 mL, Intracatheter, PRN, Eben Reyes BROCKS, MD   thiamine (VITAMIN B1) tablet 100 mg, 100 mg, Per Tube, Daily, Eben Reyes BROCKS, MD, 100 mg at 03/21/24 1612  Vitals   Vitals:   03/21/24 1218 03/21/24 1222 03/21/24 1617 03/21/24 1619  BP: (!) 154/114 (!) 161/113 (!) 179/113   Pulse: (!) 57 94 (!) 58 96  Resp: 18 18 17    Temp: 98.9 F (37.2 C) 98.5 F (36.9 C) 98.4 F (36.9 C)   TempSrc: Oral Oral Oral   SpO2: 98% 98% 100% 98%  Weight:      Height:        Body mass index is 31.13 kg/m.   Physical Exam   Constitutional: Chronically ill-appearing elderly patient in no acute distress Psych: Affect flat Eyes: No scleral injection.  HENT: No OP obstruction.  Head: Normocephalic.  Cardiovascular: A-fib on the monitor Respiratory: Effort normal, non-labored breathing.  Skin: WDI.   Neurologic Examination    NEURO:  Mental Status: Patient does not respond to name or follow commands.  She will grimace to noxious stimuli but cannot localize Speech/Language: No verbal output  Cranial Nerves:  II: PERRL.  III, IV, VI: Right gaze preference but able to cross midline to the left when she hears someone calling her from that side.  VII: Face is symmetrical resting and grimacing XII: None cooperative with tongue protrusion Motor: Does  not move bilateral upper extremities to noxious, will not move lower extremities to proximal pinch but does withdraw them slightly to noxious plantar stimulation Tone: is normal and bulk is normal Sensation- Intact to noxious throughout Coordination: Unable to perform Gait- deferred      Labs/Imaging/Neurodiagnostic studies   CBC:  Recent Labs  Lab 04-06-24 1529  03/20/24 0446 03/21/24 0500  WBC 6.6 9.8 8.5  NEUTROABS 5.4  --  6.8  HGB 14.6 15.3* 15.5*  HCT 42.9 45.1 45.9  MCV 84.4 83.7 83.2  PLT 195 225 254   Basic Metabolic Panel:  Lab Results  Component Value Date   NA 132 (L) 03/21/2024   K 4.0 03/21/2024   CO2 25 03/21/2024   GLUCOSE 141 (H) 03/21/2024   BUN 21 03/21/2024   CREATININE 1.66 (H) 03/21/2024   CALCIUM  8.6 (L) 03/21/2024   GFRNONAA 32 (L) 03/21/2024   GFRAA >60 05/17/2018   Lipid Panel:  Lab Results  Component Value Date   LDLCALC 123 (H) 01/22/2024   HgbA1c:  Lab Results  Component Value Date   HGBA1C 6.5 (H) 01/22/2024   Urine Drug Screen:     Component Value Date/Time   LABOPIA NONE DETECTED 03/19/2024 0006   COCAINSCRNUR NONE DETECTED 03/19/2024 0006   LABBENZ POSITIVE (A) 03/19/2024 0006   AMPHETMU NONE DETECTED 03/19/2024 0006   THCU NONE DETECTED 03/19/2024 0006   LABBARB NONE DETECTED 03/19/2024 0006    Alcohol Level     Component Value Date/Time   ETH <15 03/19/2024 1042   INR  Lab Results  Component Value Date   INR 1.0 03/18/2024   APTT  Lab Results  Component Value Date   APTT 159 (H) 03/21/2024    CT Head without contrast(Personally reviewed): Stable sequelae of right PCA infarct, no acute abnormality  MR Angio head without contrast and Carotid Duplex BL(Personally reviewed): Pending  MRI Brain(Personally reviewed): New areas of cortical restricted diffusion along right insula and mesial right temporal lobe, suspicious for acute MCA territory infarct, extensive subacute infarct in right PCA territory with  petechial hemorrhage  Neurodiagnostics rEEG:  Pending  ASSESSMENT   LIANNY MOLTER is a 73 y.o. female with hx of diabetes, A-fib on Eliquis , CKD stage IV, bone cancer, multinodular goiter and stroke in 9/25 who was originally admitted with thyrotoxicosis and euglycemic DKA, then found to have submassive pulmonary embolus and today is found to have acute MCA territory infarct as well as petechial hemorrhage at the site of her recent right PCA stroke.  She was on heparin to treat her pulmonary embolus, but this has been stopped given petechial hemorrhage.  At this point, exam is quite poor with patient being nonverbal and unable to follow commands or localize noxious stimuli.  She was reportedly ambulatory but confused before admission.  Will obtain EEG to rule out seizure activity given increased altered mental status.  Suspect that toxic metabolic encephalopathy and delirium also play a role in her altered mental status.  Given new right MCA stroke and pulmonary embolus, worry that recurrence of cancer has caused a hypercoagulable state.  However, MRI of the tibia and fibula demonstrate no tumor recurrence.  Would investigate further with CT and consider CT scan of chest abdomen and pelvis to rule out other metastatic disease.  Would also proceed with stroke workup given new infarct.  However, patient is in a difficult situation as she has a segmental pulmonary embolus with right heart strain and but also some petechial hemorrhage at the site of her recent R PCA stroke.  Holding goals of care conversation with family would be worthwhile.  RECOMMENDATIONS  -Routine EEG - Treatment of comorbidities and causes of toxic metabolic encephalopathy per primary team - Hold heparin for now - Consider CT chest abdomen and pelvis to investigate for recurrent metastatic disease - Permissive HTN x48 hrs  from sx onset goal BP <220/110. PRN labetalol or hydralazine if BP above these parameters. Avoid oral  antihypertensives. - CTA head and neck. - No need to repeat TTE - Continue home rosuvastatin  - Aspirin  81mg  daily. - q4 hr neuro checks - STAT head CT for any change in neuro exam - Tele - PT/OT/SLP - Stroke education - Amb referral to neurology upon discharge   ______________________________________________________________________  Patient seen by NP and then by MD, MD to edit note as needed.  Signed, Cortney E Everitt Clint Kill, NP [facility]   NEUROHOSPITALIST ADDENDUM Performed a face to face diagnostic evaluation.   I have reviewed the contents of history and physical exam as documented by PA/ARNP/Resident and agree with above documentation.  I have discussed and formulated the above plan as documented. Edits to the note have been made as needed.  Impression/Key exam findings/Plan: she looks exhausted and tired, encephalopathic and requires a lot of stimulation to get her to open her eyes and make ?brief eye contact. She is localizing with BL uppers with antigravity movements and withdraws BL lower extremities. No seizure like activity noted on my evaluation. Etiology of her strokes is unclear, perhaps paradoxcial embolus from her PE.  Agree with holding off heparin for now and use aspirin . Should be okay to resume heparin tomorrow or day after at lower Neuro scale dosing.  I would hold off on cEEG tonight but start tomorrow AM.  Meet [place], MD [facility] [phone]   If 7pm to 7am, please call on call as listed on AMION.

## 2024-03-21 NOTE — Progress Notes (Addendum)
 Rounding Note    Patient Name: Megan Fischer Date of Encounter: 03/21/2024  [facility] Cardiologist: Gordy Bergamo, MD  Chief Complaint: Abdominal pain, vomiting Reason of consult: Evaluate for possible cardiogenic shock/right heart failure  Subjective   Patient resting in bed comfortably. Sleeping and wakes up to noxious stimuli   Inpatient Medications    Scheduled Meds:  acetaminophen   1,000 mg Oral Q6H   amLODipine   5 mg Oral Daily   aspirin  EC  81 mg Oral Daily   benazepril   20 mg Oral Daily   cholestyramine  4 g Oral QID   ezetimibe   10 mg Oral Daily   famotidine   20 mg Oral Daily   insulin  aspart  0-15 Units Subcutaneous TID WC   insulin  glargine-yfgn  10 Units Subcutaneous QHS   methimazole   10 mg Oral Daily   rosuvastatin   20 mg Oral QPC supper   senna  2 tablet Oral Daily   sodium chloride  flush  10-40 mL Intracatheter Q12H   Continuous Infusions:  heparin 1,150 Units/hr (03/21/24 0610)   PRN Meds: ondansetron (ZOFRAN) IV, polyethylene glycol, sodium chloride  flush   Vital Signs    Vitals:   03/21/24 0052 03/21/24 0327 03/21/24 0500 03/21/24 0743  BP: (!) 172/124 (!) 169/109  (!) 154/104  Pulse: 94 86  78  Resp:  17  17  Temp:  98.1 F (36.7 C)  98.4 F (36.9 C)  TempSrc:  Oral  Oral  SpO2:  98%  95%  Weight:   79.7 kg   Height:        Intake/Output Summary (Last 24 hours) at 03/21/2024 1112 Last data filed at 03/21/2024 0500 Gross per 24 hour  Intake 2297.63 ml  Output 1800 ml  Net 497.63 ml      03/21/2024    5:00 AM 03/18/2024    2:17 PM 02/28/2024    9:25 AM  Last 3 Weights  Weight (lbs) 175 lb 11.3 oz 178 lb 178 lb  Weight (kg) 79.7 kg 80.74 kg 80.74 kg      Telemetry    Atrial fibrillation- Personally Reviewed  ECG    No new EKG - Personally Reviewed  Physical Exam   General: Appears older than stated age, hemodynamically stable, chronically ill HEENT: Normal cephalic, atraumatic, dry mucous membranes  trachea midline Lungs: Clear to auscultation anteriorly. Heart: Irregularly irregular, positive S1-S2 Abdomen: Soft, nontender, nondistended, positive bowel sounds Extremities: Right lower extremity warmer to touch compared to the left, no edema Neuro: Somnolent, arousable with noxious stimuli  Labs    High Sensitivity Troponin:   Recent Labs  Lab 03/18/24 1446 03/18/24 1752  TROPONINIHS 48* 41*     Chemistry Recent Labs  Lab 03/18/24 1446 03/18/24 1453 03/19/24 0215 03/19/24 1042 03/20/24 0446 03/20/24 0951 03/20/24 1218 03/20/24 1802 03/20/24 2200 03/21/24 0500  NA 140   < > 140   < > 130*   < > 132* 130* 130* 131*  K 3.8   < > 3.7   < > 3.3*   < > 4.1 3.5 3.0* 4.3  CL 106   < > 101   < > 91*   < > 95* 93* 93* 94*  CO2 20*   < > 19*   < > 22   < > 20* 23 27 22   GLUCOSE 149*   < > 151*   < > 133*   < > 160* 170* 155* 179*  BUN 18   < >  16   < > 19   < > 20 18 16 17   CREATININE 1.15*   < > 1.10*   < > 1.08*   < > 1.15* 1.14* 1.12* 1.32*  CALCIUM  9.4   < > 8.8*   < > 8.6*   < > 8.7* 8.8* 8.3* 8.4*  MG 1.7  --   --   --  1.4*  --   --   --   --   --   PROT  --    < > 7.7  --   --   --  8.1  --   --  6.9  ALBUMIN  --    < > 3.6  --   --   --  3.3*  --   --  2.9*  AST  --    < > 28  --   --   --  40  --   --  25  ALT  --    < > 15  --   --   --  21  --   --  14  ALKPHOS  --    < > 104  --   --   --  115  --   --  105  BILITOT  --    < > 1.2  --   --   --  1.2  --   --  0.9  GFRNONAA 50*   < > 53*   < > 54*   < > 50* 51* 52* 43*  ANIONGAP 14   < > 20*   < > 17*   < > 17* 14 10 15    < > = values in this interval not displayed.    Lipids No results for input(s): CHOL, TRIG, HDL, LABVLDL, LDLCALC, CHOLHDL in the last 168 hours.  Hematology Recent Labs  Lab 03/19/24 1529 03/20/24 0446 03/21/24 0500  WBC 6.6 9.8 8.5  RBC 5.08 5.39* 5.52*  HGB 14.6 15.3* 15.5*  HCT 42.9 45.1 45.9  MCV 84.4 83.7 83.2  MCH 28.7 28.4 28.1  MCHC 34.0 33.9 33.8  RDW 14.6 14.3  14.5  PLT 195 225 254   Thyroid   Recent Labs  Lab 03/19/24 0006 03/20/24 0446 03/20/24 1802  TSH <0.100*  --   --   FREET4 1.96*   < > 1.60*   < > = values in this interval not displayed.    BNP Recent Labs  Lab 03/19/24 1529  BNP 789.8*    DDimer No results for input(s): DDIMER in the last 168 hours.   Radiology    CT PE protocol: 03/20/2024 1. Right lower lobe segmental pulmonary emboli. Positive for acute PE with CTevidence of right heart strain (RV/LV Ratio = 1.1) consistent with at least submassive (intermediate risk) PE. The presence of right heart strain has been associated with an increased risk of morbidity and mortality. 2. Stable cardiomegaly. 3. Stable enlarged main pulmonary artery compatible with pulmonary arterial hypertension. 4. New scattered left upper lobe pulmonary nodules measuring up to 5 mm, possibly infectious/inflammatory, but indeterminate. No follow-up needed if patient is low-risk (and has no known or suspected primary neoplasm). Non-contrast chest CT can be considered in 12 months if patient is high-risk. This recommendation follows the consensus statement: Guidelines for Management of Incidental Pulmonary Nodules Detected on CT Images: From the Fleischner Society 2017; Radiology 2017; 284:228-243. 5. Stable enlarged heterogeneous thyroid  gland with multiple nodules. Follow-up thyroid  ultrasound recommended.  Aortic Atherosclerosis (ICD10-I70.0). interval complaints  Cardiac Studies   Echo 03/19/2024 1. Left ventricular ejection fraction, by estimation, is 50 to 55%. The  left ventricle has low normal function. The left ventricle has no regional  wall motion abnormalities. Left ventricular diastolic function could not  be evaluated.   2. Right ventricular systolic function is mildly reduced. The right  ventricular size is normal. There is severely elevated pulmonary artery  systolic pressure.   3. Left atrial size was moderately  dilated.   4. The mitral valve is degenerative. Mild mitral valve regurgitation. No  evidence of mitral stenosis.   5. The aortic valve is normal in structure. Aortic valve regurgitation is  not visualized. No aortic stenosis is present.   6. The inferior vena cava is normal in size with greater than 50%  respiratory variability, suggesting right atrial pressure of 3 mmHg.   Patient Profile     73 y.o. female with a hx of hypertension, hyperlipidemia, DM2, CKD stage III, permanent atrial fibrillation, multinodular goiter and a history of CVA in September 2025 who is being seen for the evaluation of right heart failure and the possible cardiogenic shock at the request of Dr. Eben.   Assessment & Plan    Acute submassive PE Pulmonary hypertension on echo - likely due to acute submassive PE.  Permanent atrial fibrillation. Recent CVA. Hyperthyroidism with multinodular goiter Hypertension. Hyperlipidemia. Diabetes mellitus type 2  Cardiology consulted for evaluation of cardiogenic shock possible/right heart failure.  Based on initial evaluation patient is not in cardiogenic shock-blood pressures consistently greater than 120 mmHg, and LVEF 50-55%.  Given the echocardiographic findings of elevated PASP, RV systolic function mildly reduced, given her recent stroke, reduced mobility, concern was that she may have underlying pulmonary embolism.  CT chest PE protocol was ordered and she was noted to have submassive PE.  I spoke to cardiology coverage overnight to help arrange care.  IV heparin was started and Eliquis  was discontinued.  Recommended discussing with interventional radiology given the RV to LV ratio being elevated and elevated PASP to see if she is a candidate for additional therapies.  Based on electronic medical records no interventions were performed.  Agree with lower extremity venous duplex to rule out DVT.  With regards to confusion/lactic acidosis defer to primary  team  Known history of permanent atrial fibrillation: Rates are well-controlled.  Transition IV heparin to DOAC, defer to primary team.  Hypertension: Currently on amlodipine  and benazepril .  Management per primary team  Hypothyroidism: Currently on methimazole   Recommendations discussed with primary team. Cardiology will sign off reach out if questions or concerns arise.   For questions or updates, please contact [facility] Please consult [email or web address] for contact info under      Signed, Madonna Large, DO, [facility]  A Division of [facility] [address], [place], [place] [address]  Pager: [phone] Office: [phone] 03/21/2024, 11:12 AM

## 2024-03-21 NOTE — Evaluation (Signed)
 Clinical/Bedside Swallow Evaluation Patient Details  Name: Megan Fischer MRN: [id number] Date of Birth: [birth year]  Today's Date: 03/21/2024 Time: SLP Start Time (ACUTE ONLY): 0846 SLP Stop Time (ACUTE ONLY): 0902 SLP Time Calculation (min) (ACUTE ONLY): 16 min  Past Medical History:  Past Medical History:  Diagnosis Date   Atrial fibrillation (HCC)    Chronic disease anemia 03/29/2011   Diabetes mellitus    Dyslipidemia    Hypertension    Obesities, morbid (HCC)    Osteoarthritis    Past Surgical History:  Past Surgical History:  Procedure Laterality Date   CHOLECYSTECTOMY     REPLACEMENT TOTAL KNEE BILATERAL     SHOULDER SURGERY     RIGHT SHOULDER   TUBAL LIGATION     HPI:  73 yo female presented to [facility] with DKA, acute encephalopathy being treated via the protocol. Having nausea, vomitng and abdominal discomfort. CTH revealed no acute process. Ct chest Right lower lobe segmental pulmonary emboli. Positive for acute PE, New scattered left upper lobe pulmonary nodules measuring up to 5 mm, possibly infectious/inflammatory, but indeterminate. BSE 01/25/24 BSE on CIR, no difficulty swallowing and reg/thin and no f/u.    Assessment / Plan / Recommendation  Clinical Impression  Pt exhibits significant lethargy, decreased awareness, did not follow commands and vocalized x 2 (no discernable words) Given max tactile/verbal stimulation and extra time alertness improved to consume thin and puree. Has baseline right sided facial asymmetry from prior stroke and is endentulous. Observations included mild delayed oral transit, right labial leakage and residue on lips. She initiated swallows with questionable pharyngeal delays versus oral without signs of aspiration. Needed extra time to process use of straw. She is at risk of aspiration if not alert enough however once alert safety with thin and puree appeared functional. Concerned with ability to meet nutritional needs and discussed with MD who  ordered Cortrak to facilitate nutritional needs and will allow pt to start pt on puree, thin only when adequately able to sustain alertness to utilize swallow musculature. Full supervision/assist and allow extra time to wake with tactile and verbal stimulation however suspect po intake will be low until she is able to consistently maintain alertness. SLP Visit Diagnosis: Dysphagia, unspecified (R13.10)    Aspiration Risk  Mild aspiration risk    Diet Recommendation Dysphagia 1 (Puree);Thin liquid;Other (Comment) (also rec Cortrak to meet nutritional needs in addition to allowing po's when alert)    Liquid Administration via: Cup;Straw Medication Administration: Crushed with puree (or Cortrak) Supervision: Full supervision/cueing for compensatory strategies Compensations: Slow rate;Small sips/bites;Lingual sweep for clearance of pocketing Postural Changes: Seated upright at 90 degrees    Other  Recommendations Oral Care Recommendations: Oral care BID     Assistance Recommended at Discharge    Functional Status Assessment Patient has had a recent decline in their functional status and demonstrates the ability to make significant improvements in function in a reasonable and predictable amount of time.  Frequency and Duration min 2x/week  2 weeks       Prognosis Prognosis for improved oropharyngeal function:  (fair-good) Barriers to Reach Goals: Cognitive deficits      Swallow Study   General Date of Onset: 03/18/24 HPI: 72 yo female presented to [facility] with DKA, acute encephalopathy being treated via the protocol. Having nausea, vomitng and abdominal discomfort. CTH revealed no acute process. Ct chest Right lower lobe segmental pulmonary emboli. Positive for acute PE, New scattered left upper lobe pulmonary nodules measuring up to 5 mm,  possibly infectious/inflammatory, but indeterminate. BSE 01/25/24 BSE on CIR, no difficulty swallowing and reg/thin and no f/u. Type of Study: Bedside  Swallow Evaluation Previous Swallow Assessment:  (see HPI) Diet Prior to this Study: Dysphagia 1 (pureed);Thin liquids (Level 0);Other (Comment) (and rec Cortrak for adequate nutrition) Temperature Spikes Noted: No Respiratory Status: Room air History of Recent Intubation: No Behavior/Cognition: Lethargic/Drowsy;Requires cueing;Doesn't follow directions Oral Cavity Assessment: Within Functional Limits Oral Care Completed by SLP: No Oral Cavity - Dentition: Edentulous Vision:  (uncertain) Self-Feeding Abilities: Total assist Patient Positioning: Upright in bed Baseline Vocal Quality: Normal (only vocalization x 1) Volitional Cough: Cognitively unable to elicit Volitional Swallow: Unable to elicit    Oral/Motor/Sensory Function Overall Oral Motor/Sensory Function:  (did not follow command- appears to have right facial weakness from prior stroke)   Ice Chips Ice chips: Impaired Presentation: Spoon Oral Phase Impairments: Reduced labial seal Oral Phase Functional Implications: Right anterior spillage Pharyngeal Phase Impairments: Suspected delayed Swallow   Thin Liquid Thin Liquid: Impaired Presentation: Cup;Spoon;Straw Oral Phase Impairments: Reduced labial seal Oral Phase Functional Implications: Right anterior spillage;Prolonged oral transit Pharyngeal  Phase Impairments: Suspected delayed Swallow (versus oral delay)    Nectar Thick Nectar Thick Liquid: Not tested   Honey Thick Honey Thick Liquid: Not tested   Puree Puree: Impaired Presentation: Spoon Oral Phase Impairments: Reduced labial seal Oral Phase Functional Implications: Prolonged oral transit (labial residue)   Solid     Solid: Not tested      Dustin Olam Bull 03/21/2024,9:43 AM

## 2024-03-21 NOTE — Progress Notes (Signed)
 PHARMACY - ANTICOAGULATION CONSULT NOTE  Pharmacy Consult for heparin gtt Indication: submassive PE [CTA]  Allergies  Allergen Reactions   Aspirin  Nausea Only    Patient stated to neuro that it caused an upset stomach   Codeine Other (See Comments)    Unknown    Fentanyl  Other (See Comments)    hypoxia   Oxycodone Hcl Other (See Comments)    Unknown    Penicillins Other (See Comments)    Unknown    Chlorhexidine Gluconate Rash    Patient Measurements: Height: 5' 3 (160 cm) Weight: 79.7 kg (175 lb 11.3 oz) IBW/kg (Calculated) : 52.4 HEPARIN DW (KG): 70.1  Vital Signs: Temp: 98.1 F (36.7 C) (11/14 0327) Temp Source: Oral (11/14 0327) BP: 169/109 (11/14 0327) Pulse Rate: 86 (11/14 0327)  Labs: Recent Labs    03/18/24 1446 03/18/24 1453 03/18/24 1752 03/19/24 0006 03/19/24 1529 03/19/24 2117 03/20/24 0446 03/20/24 0951 03/20/24 1802 03/20/24 2200 03/21/24 0500  HGB 14.0   < >  --    < > 14.6  --  15.3*  --   --   --  15.5*  HCT 43.8   < >  --    < > 42.9  --  45.1  --   --   --  45.9  PLT 188  --   --    < > 195  --  225  --   --   --  254  APTT  --   --   --   --   --   --   --   --   --   --  159*  LABPROT 13.3  --   --   --   --   --   --   --   --   --   --   INR 1.0  --   --   --   --   --   --   --   --   --   --   HEPARINUNFRC  --   --   --   --   --   --   --   --   --   --  >1.10*  CREATININE 1.15*   < >  --    < > 1.02*   < > 1.08*   < > 1.14* 1.12* 1.32*  CKTOTAL  --   --   --   --  320*  --   --   --   --   --  148  TROPONINIHS 48*  --  41*  --   --   --   --   --   --   --   --    < > = values in this interval not displayed.    Estimated Creatinine Clearance: 37.9 mL/min (A) (by C-G formula based on SCr of 1.32 mg/dL (H)).   Medical History: Past Medical History:  Diagnosis Date   Atrial fibrillation (HCC)    Chronic disease anemia 03/29/2011   Diabetes mellitus    Dyslipidemia    Hypertension    Obesities, morbid (HCC)     Osteoarthritis     Medications:  Medications Prior to Admission  Medication Sig Dispense Refill Last Dose/Taking   acetaminophen  (TYLENOL ) 325 MG tablet Take 1-2 tablets (325-650 mg total) by mouth every 4 (four) hours as needed for mild pain (pain score 1-3).   03/17/2024   amLODipine  (NORVASC ) 2.5 MG  tablet Take 1 tablet (2.5 mg total) by mouth daily. 30 tablet 0 03/17/2024   apixaban  (ELIQUIS ) 5 MG TABS tablet Take 1 tablet (5 mg total) by mouth 2 (two) times daily. 200 tablet 2 03/17/2024 at  8:30 PM   aspirin  EC 81 MG tablet Take 1 tablet (81 mg total) by mouth daily. Swallow whole. 30 tablet 0 03/17/2024   benazepril  (LOTENSIN ) 20 MG tablet TAKE 1 TABLET BY MOUTH DAILY 100 tablet 2 03/17/2024   CALCIUM  PO Take 1 tablet by mouth 2 (two) times daily.   Unknown   camphor-menthol  (SARNA) lotion Apply topically as needed for itching. 222 mL 0 Past Month   celecoxib (CELEBREX) 200 MG capsule Take 1 capsule (200 mg total) by mouth daily as needed for moderate pain (pain score 4-6). 30 capsule 0 03/17/2024   diclofenac  Sodium (VOLTAREN ) 1 % GEL Apply 2 g topically 4 (four) times daily. To both knees 150 g 0 Past Week   DULoxetine  (CYMBALTA ) 60 MG capsule Take 60 mg by mouth daily.   Unknown   ezetimibe  (ZETIA ) 10 MG tablet Take 1 tablet (10 mg total) by mouth daily. 90 tablet 2 03/17/2024   famotidine  (PEPCID ) 20 MG tablet Take 1 tablet (20 mg total) by mouth daily. 30 tablet 0 03/17/2024   gabapentin  (NEURONTIN ) 100 MG capsule Take 1 capsule (100 mg total) by mouth 3 (three) times daily. (Patient taking differently: Take 300 mg by mouth See admin instructions. 1-2 times daily only) 90 capsule 0 03/17/2024   glimepiride  (AMARYL ) 1 MG tablet Take 1 tablet (1 mg total) by mouth daily with breakfast. 90 tablet 4 03/17/2024   JARDIANCE  25 MG TABS tablet Take 25 mg by mouth daily.   03/17/2024   lidocaine (LIDODERM) 5 % Place 2 patches onto the skin daily. Remove & Discard patch within 12 hours or as  directed by MD 60 patch 0 Past Week   methimazole  (TAPAZOLE ) 5 MG tablet TAKE 1 TABLET BY MOUTH DAILY 100 tablet 2 03/17/2024   methocarbamol  (ROBAXIN ) 500 MG tablet Take 1 tablet (500 mg total) by mouth every 6 (six) hours as needed for muscle spasms. 30 tablet 0 03/17/2024   metoprolol  succinate (TOPROL -XL) 50 MG 24 hr tablet TAKE 1 TABLET BY MOUTH DAILY  WITH OR IMMEDIATELY FOLLOWING A  MEAL 90 tablet 3 03/17/2024   Multiple Vitamins-Minerals (MULTIVITAMIN WITH MINERALS) tablet Take 1 tablet by mouth daily.   03/17/2024   rosuvastatin  (CRESTOR ) 20 MG tablet Take 1 tablet (20 mg total) by mouth daily after supper. 30 tablet 0 03/17/2024   Semaglutide , 2 MG/DOSE, (OZEMPIC , 2 MG/DOSE,) 8 MG/3ML SOPN INJECT SUBCUTANEOUSLY 2 MG EVERY WEEK 9 mL 3 03/16/2024   Blood Glucose Monitoring Suppl DEVI 1 each by Does not apply route as directed. Dispense based on patient and insurance preference. Use up to four times daily as directed. (FOR ICD-10 E10.9, E11.9). 1 each 0    ferrous sulfate  325 (65 FE) MG EC tablet Take 1 tablet (325 mg total) by mouth 2 (two) times daily. (Patient not taking: Reported on 03/18/2024) 60 tablet 3 Not Taking   Glucose Blood (BLOOD GLUCOSE TEST STRIPS) STRP 1 each by Does not apply route as directed. Dispense based on patient and insurance preference. Use up to four times daily as directed. (FOR ICD-10 E10.9, E11.9). 100 strip 0    Lancet Device MISC 1 each by Does not apply route as directed. Dispense based on patient and insurance preference. Use up to four times  daily as directed. (FOR ICD-10 E10.9, E11.9). 1 each 0    Lancets MISC 1 each by Does not apply route as directed. Dispense based on patient and insurance preference. Use up to four times daily as directed. (FOR ICD-10 E10.9, E11.9). 100 each 0    Scheduled:   acetaminophen   1,000 mg Oral Q6H   amLODipine   5 mg Oral Daily   aspirin  EC  81 mg Oral Daily   benazepril   20 mg Oral Daily   cholestyramine  4 g Oral QID    ezetimibe   10 mg Oral Daily   famotidine   20 mg Oral Daily   insulin  aspart  0-15 Units Subcutaneous TID WC   insulin  glargine-yfgn  10 Units Subcutaneous QHS   methimazole   10 mg Oral Daily   rosuvastatin   20 mg Oral QPC supper   senna  2 tablet Oral Daily   sodium chloride  flush  10-40 mL Intracatheter Q12H    Assessment: 73 YOF admitted w/ cc of progressively worsening HA, confusion, and restlessness discovered to have a submassive PE on CTA. She has a history of apixaban  use for AF. Last dose was taken 11/13 at 1228. 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 Patient to be re-assessed by night team [IMTS] and they will make a determination if another CT head is needed to evaluate for new CVA [rapid response ~1844]  11/14 AM update:  aPTT supra-therapeutic   Goal of Therapy:  Heparin level 0.3-0.5 units/ml aPTT 66-85 seconds Monitor platelets by anticoagulation protocol: Yes   Plan:  Dec heparin to 1150 units/hr Heparin level and aPTT in 8 hours  Lynwood Mckusick, PharmD, BCPS Clinical Pharmacist Phone: [phone]

## 2024-03-21 NOTE — TOC Initial Note (Addendum)
 Transition of Care [facility]) - Initial/Assessment Note    Patient Details  Name: Megan Fischer MRN: [id number] Date of Birth: 01/30/1951  Transition of Care [facility]) CM/SW Contact:    Landry DELENA Senters, RN Phone Number: 03/21/2024, 1:48 PM  Clinical Narrative:                 Chief Complaint: Headache, restlessness, and confusion   Patient unable to participate in conversation due to confusion. CM called and spoke with next of kin, daughter Mylinda, for more details on patient. Patient lives at home with son, DIL, and grandson, and has 24/7 care. Patient has DME-walker, cane, W/C, hosp. Bed, and tub bench--at home. Patient has transportation to appts and at d/c.   Continued medical workout. CM will continue to follow.   Expected Discharge Plan:  (TBD) Barriers to Discharge: Continued Medical Work up   Patient Goals and CMS Choice            Expected Discharge Plan and Services       Living arrangements for the past 2 months: Apartment                                      Prior Living Arrangements/Services Living arrangements for the past 2 months: Apartment Lives with:: Self, Adult Children          Need for Family Participation in Patient Care: Yes (Comment) Care giver support system in place?: Yes (comment) Current home services: DME (hospital bed, walker, cane, wheelchair, shower bench) Criminal Activity/Legal Involvement Pertinent to Current Situation/Hospitalization: No - Comment as needed  Activities of Daily Living   ADL Screening (condition at time of admission) Independently performs ADLs?: No  Permission Sought/Granted                  Emotional Assessment Appearance:: Developmentally appropriate Attitude/Demeanor/Rapport: Lethargic Affect (typically observed): Calm Orientation: : Oriented to Self Alcohol / Substance Use: Not Applicable Psych Involvement: No (comment)  Admission diagnosis:  Thyrotoxicosis [E05.90] Headache  [R51.9] Nonintractable headache, unspecified chronicity pattern, unspecified headache type [R51.9] Nausea and vomiting, unspecified vomiting type [R11.2] Encephalopathy, unspecified type [G93.40] Patient Active Problem List   Diagnosis Date Noted   Pulmonary embolism (HCC) 03/21/2024   Hypercoagulable state 03/21/2024   AKI (acute kidney injury) 03/21/2024   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   Right ventricular dysfunction 03/20/2024   History of recent stroke 03/20/2024   Hyperthyroidism 03/19/2024   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   Delirium 03/19/2024   High anion gap metabolic acidosis 03/19/2024   Lactic acidosis 03/19/2024   Euglycemic DKA (diabetic ketoacidosis) (HCC) 03/19/2024   Hyperlipemia 03/18/2024   Thyroid  storm 03/18/2024   Encephalopathy 03/18/2024   Abdominal pain, epigastric 03/18/2024   Hemiplegia and hemiparesis following cerebral infarction affecting left non-dominant side (HCC) 03/18/2024   Headache 03/18/2024   Vomiting 03/18/2024   Left shoulder pain 02/18/2024   CKD (chronic kidney disease) stage 3, GFR 30-59 ml/min (HCC) 01/31/2024   Stroke (HCC) 01/21/2024   Acute ischemic right PCA stroke (HCC) 01/21/2024   Cellulitis of left finger 09/19/2023   Chest pain 09/19/2023   Leg pain 03/02/2023   Acute respiratory failure with hypoxia (HCC) 04/11/2021   Acute on chronic diastolic CHF (congestive heart failure) (HCC) 04/11/2021   Hypercholesterolemia 09/17/2018   Iron deficiency anemia 06/24/2017   Anemia of chronic disease 07/20/2016   Healthcare maintenance 06/09/2013   Multinodular goiter 01/20/2013  Chronic disease anemia 03/29/2011   Obesities, morbid (HCC)    Hypertension    Dyslipidemia    DM (diabetes mellitus) type II controlled with renal manifestation [facility])    Atrial fibrillation (HCC) 08/04/2010   PCP:  Bernadine Manos, MD Pharmacy:   [facility]) - [place], [place] - [address] [facility] [address] [place] [address] [place] [address] Phone: [phone] Fax: [phone]  [facility] - [place], [place] - [address] [place] [address] Phone: [phone] Fax: [phone]  [facility] [address] [place] [address] Phone: [phone] Fax: [phone]  [facility] [address] [place] - [address] [place] [address] Phone: [phone] Fax: [phone]  [facility] [id number] - [place], [place] - [address] [place] [address] Phone: [phone] Fax: [phone]     Social Drivers of Health (SDOH) Social History: SDOH Screenings   Food Insecurity: Patient Unable To Answer (03/21/2024)  Housing: Unknown (03/21/2024)  Transportation Needs: Patient Unable To Answer (03/21/2024)  Utilities: Patient Unable To Answer (03/21/2024)  Alcohol Screen: Low Risk  (03/01/2023)  Depression (PHQ2-9): Low Risk  (02/28/2024)  Financial Resource Strain: Low Risk  (10/19/2023)   Received from [facility]  Physical Activity: Insufficiently Active (03/01/2023)  Social Connections: Patient Unable To Answer (03/21/2024)  Stress: No Stress Concern Present (03/01/2023)  Tobacco Use: Low Risk  (02/28/2024)  Health Literacy: Adequate Health Literacy (03/01/2023)   SDOH Interventions:     Readmission Risk Interventions     No data to display

## 2024-03-21 NOTE — Progress Notes (Signed)
 MRI of head revealed acute right MCA stroke. Spoke with neurology who recommended discontinuing the heparin as she has petechial hemorrhage in the occipital region where her prior R PCA stoke is. Neurology is consulted and will evaluate the patient and provide further recommendations:  Plan: -CTA of head and neck ordered STAT -Stop heparin at this time  -Spot EEG ordered to evaluate subclinical seizures  -Follow up neurology recommendations    Damien Lease, DO Internal Medicine Resident: [id number] Please contact the on call pager at: [phone]

## 2024-03-21 NOTE — Plan of Care (Signed)
  Problem: Education: Goal: Knowledge of General Education information will improve Description: Including pain rating scale, medication(s)/side effects and non-pharmacologic comfort measures Outcome: Progressing   Problem: Clinical Measurements: Goal: Ability to maintain clinical measurements within normal limits will improve Outcome: Progressing   Problem: Clinical Measurements: Goal: Diagnostic test results will improve Outcome: Progressing   

## 2024-03-22 ENCOUNTER — Inpatient Hospital Stay (HOSPITAL_COMMUNITY)

## 2024-03-22 DIAGNOSIS — Z515 Encounter for palliative care: Secondary | ICD-10-CM | POA: Diagnosis not present

## 2024-03-22 DIAGNOSIS — I63511 Cerebral infarction due to unspecified occlusion or stenosis of right middle cerebral artery: Secondary | ICD-10-CM | POA: Diagnosis not present

## 2024-03-22 DIAGNOSIS — I63231 Cerebral infarction due to unspecified occlusion or stenosis of right carotid arteries: Secondary | ICD-10-CM | POA: Diagnosis not present

## 2024-03-22 DIAGNOSIS — Z7189 Other specified counseling: Secondary | ICD-10-CM

## 2024-03-22 DIAGNOSIS — E785 Hyperlipidemia, unspecified: Secondary | ICD-10-CM

## 2024-03-22 DIAGNOSIS — Z8673 Personal history of transient ischemic attack (TIA), and cerebral infarction without residual deficits: Secondary | ICD-10-CM

## 2024-03-22 DIAGNOSIS — N184 Chronic kidney disease, stage 4 (severe): Secondary | ICD-10-CM

## 2024-03-22 DIAGNOSIS — I2699 Other pulmonary embolism without acute cor pulmonale: Secondary | ICD-10-CM | POA: Diagnosis not present

## 2024-03-22 DIAGNOSIS — E1122 Type 2 diabetes mellitus with diabetic chronic kidney disease: Secondary | ICD-10-CM | POA: Diagnosis not present

## 2024-03-22 DIAGNOSIS — I4891 Unspecified atrial fibrillation: Secondary | ICD-10-CM | POA: Diagnosis not present

## 2024-03-22 LAB — CBC
HCT: 42 % (ref 36.0–46.0)
Hemoglobin: 14.1 g/dL (ref 12.0–15.0)
MCH: 28.4 pg (ref 26.0–34.0)
MCHC: 33.6 g/dL (ref 30.0–36.0)
MCV: 84.5 fL (ref 80.0–100.0)
Platelets: 200 K/uL (ref 150–400)
RBC: 4.97 MIL/uL (ref 3.87–5.11)
RDW: 14.9 % (ref 11.5–15.5)
WBC: 7.3 K/uL (ref 4.0–10.5)
nRBC: 0 % (ref 0.0–0.2)

## 2024-03-22 LAB — BASIC METABOLIC PANEL WITH GFR
Anion gap: 11 (ref 5–15)
BUN: 34 mg/dL — ABNORMAL HIGH (ref 8–23)
CO2: 23 mmol/L (ref 22–32)
Calcium: 8.3 mg/dL — ABNORMAL LOW (ref 8.9–10.3)
Chloride: 94 mmol/L — ABNORMAL LOW (ref 98–111)
Creatinine, Ser: 2.32 mg/dL — ABNORMAL HIGH (ref 0.44–1.00)
GFR, Estimated: 22 mL/min — ABNORMAL LOW (ref >60)
Glucose, Bld: 169 mg/dL — ABNORMAL HIGH (ref 70–99)
Potassium: 3.7 mmol/L (ref 3.5–5.1)
Sodium: 128 mmol/L — ABNORMAL LOW (ref 135–145)

## 2024-03-22 LAB — GLUCOSE, CAPILLARY
Glucose-Capillary: 163 mg/dL — ABNORMAL HIGH (ref 70–99)
Glucose-Capillary: 199 mg/dL — ABNORMAL HIGH (ref 70–99)
Glucose-Capillary: 254 mg/dL — ABNORMAL HIGH (ref 70–99)
Glucose-Capillary: 108 mg/dL — ABNORMAL HIGH (ref 70–99)

## 2024-03-22 LAB — PHOSPHORUS: Phosphorus: 4.3 mg/dL (ref 2.5–4.6)

## 2024-03-22 LAB — HEPARIN LEVEL (UNFRACTIONATED): Heparin Unfractionated: 0.96 [IU]/mL — ABNORMAL HIGH (ref 0.30–0.70)

## 2024-03-22 LAB — LIPID PANEL
Cholesterol: 155 mg/dL (ref 0–200)
HDL: 46 mg/dL (ref >40)
LDL Cholesterol: 87 mg/dL (ref 0–99)
Total CHOL/HDL Ratio: 3.4 ratio
Triglycerides: 108 mg/dL (ref <150)
VLDL: 22 mg/dL (ref 0–40)

## 2024-03-22 LAB — APTT: aPTT: 136 s — ABNORMAL HIGH (ref 24–36)

## 2024-03-22 LAB — VITAMIN B1: Vitamin B1 (Thiamine): 71.1 nmol/L (ref 66.5–200.0)

## 2024-03-22 LAB — MAGNESIUM: Magnesium: 2.2 mg/dL (ref 1.7–2.4)

## 2024-03-22 MED ORDER — LORAZEPAM 1 MG PO TABS
1.0000 mg | ORAL_TABLET | Freq: Once | ORAL | Status: DC | PRN
Start: 2024-03-22 — End: 2024-03-26

## 2024-03-22 MED ORDER — LEVETIRACETAM 500 MG PO TABS: 500.0000 mg | ORAL_TABLET | Freq: Two times a day (BID) | ORAL | Status: DC

## 2024-03-22 MED ORDER — ALTEPLASE 2 MG IJ SOLR
2.0000 mg | Freq: Once | INTRAMUSCULAR | Status: AC
  Administered 2024-03-22: 2 mg
  Filled 2024-03-22: qty 2

## 2024-03-22 MED ORDER — LEVETIRACETAM 500 MG PO TABS
500.0000 mg | ORAL_TABLET | Freq: Once | ORAL | Status: AC
  Administered 2024-03-22: 500 mg

## 2024-03-22 MED ORDER — HEPARIN (PORCINE) 25000 UT/250ML-% IV SOLN
1000.0000 [IU]/h | INTRAVENOUS | Status: DC
  Administered 2024-03-22: 1000 [IU]/h via INTRAVENOUS
  Filled 2024-03-22: qty 250

## 2024-03-22 MED ORDER — LEVETIRACETAM 500 MG PO TABS
500.0000 mg | ORAL_TABLET | Freq: Two times a day (BID) | ORAL | Status: DC
  Administered 2024-03-23 – 2024-03-26 (×7): 500 mg
  Filled 2024-03-22: qty 1
  Filled 2024-03-22: qty 2
  Filled 2024-03-22 (×5): qty 1

## 2024-03-22 MED ORDER — METOPROLOL TARTRATE 25 MG PO TABS
25.0000 mg | ORAL_TABLET | Freq: Two times a day (BID) | ORAL | Status: DC
  Administered 2024-03-22 – 2024-03-26 (×8): 25 mg
  Filled 2024-03-22 (×8): qty 1

## 2024-03-22 MED ORDER — LACTATED RINGERS IV BOLUS
1000.0000 mL | Freq: Once | INTRAVENOUS | Status: AC
  Administered 2024-03-22: 1000 mL via INTRAVENOUS

## 2024-03-22 MED ORDER — LEVETIRACETAM 500 MG PO TABS
500.0000 mg | ORAL_TABLET | Freq: Two times a day (BID) | ORAL | Status: DC
  Filled 2024-03-22: qty 1

## 2024-03-22 NOTE — Progress Notes (Addendum)
 PHARMACY - ANTICOAGULATION CONSULT NOTE  Pharmacy Consult for heparin gtt Indication: submassive PE [CTA]  Allergies  Allergen Reactions   Aspirin  Nausea Only    Patient stated to neuro that it caused an upset stomach   Codeine Other (See Comments)    Unknown    Fentanyl  Other (See Comments)    hypoxia   Oxycodone Hcl Other (See Comments)    Unknown    Penicillins Other (See Comments)    Unknown    Chlorhexidine Gluconate Rash    Patient Measurements: Height: 5' 3 (160 cm) Weight: 82 kg (180 lb 12.4 oz) IBW/kg (Calculated) : 52.4 HEPARIN DW (KG): 70.1  Vital Signs: Temp: 97.8 F (36.6 C) (11/15 0410) Temp Source: Oral (11/15 0410) BP: 128/91 (11/15 0410) Pulse Rate: 85 (11/15 0410)  Labs: Recent Labs    03/19/24 1529 03/19/24 2117 03/20/24 0446 03/20/24 0951 03/21/24 0500 03/21/24 0900 03/22/24 0527  HGB 14.6  --  15.3*  --  15.5*  --  14.1  HCT 42.9  --  45.1  --  45.9  --  42.0  PLT 195  --  225  --  254  --  200  APTT  --   --   --   --  159*  --   --   HEPARINUNFRC  --   --   --   --  >1.10*  --   --   CREATININE 1.02*   < > 1.08*   < > 1.32* 1.66* 2.32*  CKTOTAL 320*  --   --   --  148  --   --    < > = values in this interval not displayed.    Estimated Creatinine Clearance: 21.9 mL/min (A) (by C-G formula based on SCr of 2.32 mg/dL (H)).   Medical History: Past Medical History:  Diagnosis Date   Atrial fibrillation (HCC)    Chronic disease anemia 03/29/2011   Diabetes mellitus    Dyslipidemia    Hypertension    Obesities, morbid (HCC)    Osteoarthritis     Medications:  Medications Prior to Admission  Medication Sig Dispense Refill Last Dose/Taking   acetaminophen  (TYLENOL ) 325 MG tablet Take 1-2 tablets (325-650 mg total) by mouth every 4 (four) hours as needed for mild pain (pain score 1-3).   03/17/2024   amLODipine  (NORVASC ) 2.5 MG tablet Take 1 tablet (2.5 mg total) by mouth daily. 30 tablet 0 03/17/2024   apixaban  (ELIQUIS ) 5 MG  TABS tablet Take 1 tablet (5 mg total) by mouth 2 (two) times daily. 200 tablet 2 03/17/2024 at  8:30 PM   aspirin  EC 81 MG tablet Take 1 tablet (81 mg total) by mouth daily. Swallow whole. 30 tablet 0 03/17/2024   benazepril  (LOTENSIN ) 20 MG tablet TAKE 1 TABLET BY MOUTH DAILY 100 tablet 2 03/17/2024   CALCIUM  PO Take 1 tablet by mouth 2 (two) times daily.   Unknown   camphor-menthol  (SARNA) lotion Apply topically as needed for itching. 222 mL 0 Past Month   celecoxib (CELEBREX) 200 MG capsule Take 1 capsule (200 mg total) by mouth daily as needed for moderate pain (pain score 4-6). 30 capsule 0 03/17/2024   diclofenac  Sodium (VOLTAREN ) 1 % GEL Apply 2 g topically 4 (four) times daily. To both knees 150 g 0 Past Week   DULoxetine  (CYMBALTA ) 60 MG capsule Take 60 mg by mouth daily.   Unknown   ezetimibe  (ZETIA ) 10 MG tablet Take 1 tablet (10 mg total)  by mouth daily. 90 tablet 2 03/17/2024   famotidine  (PEPCID ) 20 MG tablet Take 1 tablet (20 mg total) by mouth daily. 30 tablet 0 03/17/2024   gabapentin  (NEURONTIN ) 100 MG capsule Take 1 capsule (100 mg total) by mouth 3 (three) times daily. (Patient taking differently: Take 300 mg by mouth See admin instructions. 1-2 times daily only) 90 capsule 0 03/17/2024   glimepiride  (AMARYL ) 1 MG tablet Take 1 tablet (1 mg total) by mouth daily with breakfast. 90 tablet 4 03/17/2024   JARDIANCE  25 MG TABS tablet Take 25 mg by mouth daily.   03/17/2024   lidocaine (LIDODERM) 5 % Place 2 patches onto the skin daily. Remove & Discard patch within 12 hours or as directed by MD 60 patch 0 Past Week   methimazole  (TAPAZOLE ) 5 MG tablet TAKE 1 TABLET BY MOUTH DAILY 100 tablet 2 03/17/2024   methocarbamol  (ROBAXIN ) 500 MG tablet Take 1 tablet (500 mg total) by mouth every 6 (six) hours as needed for muscle spasms. 30 tablet 0 03/17/2024   metoprolol  succinate (TOPROL -XL) 50 MG 24 hr tablet TAKE 1 TABLET BY MOUTH DAILY  WITH OR IMMEDIATELY FOLLOWING A  MEAL 90 tablet 3  03/17/2024   Multiple Vitamins-Minerals (MULTIVITAMIN WITH MINERALS) tablet Take 1 tablet by mouth daily.   03/17/2024   rosuvastatin  (CRESTOR ) 20 MG tablet Take 1 tablet (20 mg total) by mouth daily after supper. 30 tablet 0 03/17/2024   Semaglutide , 2 MG/DOSE, (OZEMPIC , 2 MG/DOSE,) 8 MG/3ML SOPN INJECT SUBCUTANEOUSLY 2 MG EVERY WEEK 9 mL 3 03/16/2024   Blood Glucose Monitoring Suppl DEVI 1 each by Does not apply route as directed. Dispense based on patient and insurance preference. Use up to four times daily as directed. (FOR ICD-10 E10.9, E11.9). 1 each 0    ferrous sulfate  325 (65 FE) MG EC tablet Take 1 tablet (325 mg total) by mouth 2 (two) times daily. (Patient not taking: Reported on 03/18/2024) 60 tablet 3 Not Taking   Glucose Blood (BLOOD GLUCOSE TEST STRIPS) STRP 1 each by Does not apply route as directed. Dispense based on patient and insurance preference. Use up to four times daily as directed. (FOR ICD-10 E10.9, E11.9). 100 strip 0    Lancet Device MISC 1 each by Does not apply route as directed. Dispense based on patient and insurance preference. Use up to four times daily as directed. (FOR ICD-10 E10.9, E11.9). 1 each 0    Lancets MISC 1 each by Does not apply route as directed. Dispense based on patient and insurance preference. Use up to four times daily as directed. (FOR ICD-10 E10.9, E11.9). 100 each 0    Scheduled:    stroke: early stages of recovery book   Does not apply Once   acetaminophen  (TYLENOL ) oral liquid 160 mg/5 mL  1,000 mg Per Tube Q6H   aspirin   81 mg Per Tube Daily   cholestyramine  4 g Per Tube QID   ezetimibe   10 mg Per Tube Daily   famotidine   20 mg Per Tube Daily   feeding supplement (PROSource TF20)  60 mL Per Tube Daily   insulin  aspart  0-15 Units Subcutaneous TID WC   insulin  glargine-yfgn  10 Units Subcutaneous QHS   methimazole   10 mg Per Tube Daily   rosuvastatin   20 mg Per Tube QPC supper   senna  2 tablet Per Tube Daily   sodium chloride  flush   10-40 mL Intracatheter Q12H   thiamine  100 mg Per Tube  Daily    Assessment: 51 YOF admitted w/ cc of progressively worsening HA, confusion, and restlessness discovered to have a submassive PE on CTA. She has a history of apixaban  use for AF. Last dose was taken 11/13 at 1228. 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 Patient to be re-assessed by night team [IMTS] and they will make a determination if another CT head is needed to evaluate for new CVA [rapid response ~1844].  11/15 AM: APTT was supratherapeutic at 159 seconds on 11/14 following on 1300 units/hr. Heparin was held around 1600 on 11/14 secondary to petechial hemorrhage of the prior R PCA infarct. Hgb 14.1, plt 200. Per neuro, ok to resume heparin 11/15 with no bolus using stroke protocol. Will continue to adjust heparin off of aPTT until aPTT and heparin levels correlate.  Goal of Therapy:  Heparin level 0.3-0.5 units/ml aPTT 66-85 seconds Monitor platelets by anticoagulation protocol: Yes   Plan:  Heparin 1000 units/hr Heparin level and aPTT in 8 hours Monitor signs and symptoms of bleeding daily   B. Amon Rocher, PharmD PGY-1 Pharmacy Resident [facility] 03/22/2024 9:51 AM

## 2024-03-22 NOTE — Progress Notes (Signed)
 Speech Language Pathology Treatment: Dysphagia  Patient Details Name: Megan Fischer MRN: [id number] DOB: 11/14/1950 Today's Date: 03/22/2024 Time: 8664-8650 SLP Time Calculation (min) (ACUTE ONLY): 14 min  Assessment / Plan / Recommendation Clinical Impression  Recommend continue current Dysphagia 1/thin liquid diet with swallow precautions in place and FULL A/supervision with all intake/feeder.  Cognitive impairment primary cause for dysphagia symptoms.  ST will continue to f/u in acute setting for dysphagia tx/advancement of diet/education.  Pt seen for dysphagia f/u tx session with current diet and Cortrak in place for nutrition/hydration purposes.  Pt consumed small bites/sips of thin/puree without overt s/s of aspiration present.  Oral holding initially, but mod Verbal/tactile cues increased speed of swallow/increased oral manipulation.  Pt able to follow 1-2 step oral directives during trial.  Straw sips were elicited, but pt exhibited anterior loss with larger volume of thin.  Recommend small sips and/or tsp amounts of thin during meals/snacks.  Medications given via alternative nutrition and/or crushed with puree.  HPI HPI: 73 yo female presented to [facility] with DKA, acute encephalopathy being treated via the protocol. Having nausea, vomitng and abdominal discomfort. CTH revealed no acute process. Ct chest Right lower lobe segmental pulmonary emboli. Positive for acute PE, New scattered left upper lobe pulmonary nodules measuring up to 5 mm, possibly infectious/inflammatory, but indeterminate. BSE 01/25/24 BSE on CIR, no difficulty swallowing and reg/thin and no f/u; BSE on 11/14 placed on Dysphagia 1/puree and has Cortrak.  ST f/u for diet tolerance/advancement and SLE.      SLP Plan  Continue with current plan of care          Recommendations  Diet recommendations: Dysphagia 1 (puree);Thin liquid Liquids provided via: Cup;Teaspoon Medication Administration: Crushed with  puree Supervision: Full supervision/cueing for compensatory strategies;Trained caregiver to feed patient Compensations: Slow rate;Small sips/bites;Minimize environmental distractions                  Oral care BID;Staff/trained caregiver to provide oral care   Frequent or constant Supervision/Assistance Dysphagia, unspecified (R13.10)     Continue with current plan of care     Pat Olliver Boyadjian,M.S., CCC-SLP 03/22/2024, 2:20 PM

## 2024-03-22 NOTE — Plan of Care (Signed)
 Sitter at bedside. Able to tell MD her name and year. Before she stop answering questions.    Problem: Education: Goal: Knowledge of General Education information will improve Description: Including pain rating scale, medication(s)/side effects and non-pharmacologic comfort measures Outcome: Progressing   Problem: Activity: Goal: Risk for activity intolerance will decrease Outcome: Progressing   Problem: Nutrition: Goal: Adequate nutrition will be maintained Outcome: Progressing   Problem: Safety: Goal: Ability to remain free from injury will improve Outcome: Progressing   Problem: Skin Integrity: Goal: Risk for impaired skin integrity will decrease Outcome: Progressing

## 2024-03-22 NOTE — Progress Notes (Signed)
 PT Cancellation Note  Patient Details Name: Megan Fischer MRN: [id number] DOB: 09/08/1950   Cancelled Treatment:    Reason Eval/Treat Not Completed: Patient at procedure or test/unavailable (getting set up for EEG)  Megan Fischer, PT, DPT Acute Rehabilitation Services Office [phone]    Megan Fischer 03/22/2024, 9:31 AM

## 2024-03-22 NOTE — Evaluation (Addendum)
 Physical Therapy Evaluation Patient Details Name: Megan Fischer MRN: [id number] DOB: 1951/02/03 Today's Date: 03/22/2024  History of Present Illness  73 y/o F presenting to ED on 11/11 with worsening headache, restlessness, confusion x3 days. CTA significant for subsegmental PE. MRI head with R MCA CVA and petechial hemorrhage in territory of recent R PCA CVA.    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 Pt is currently A&O to self only; able to state name and birthday. Pt is restless and attempting to pull at lines/leads with safety sitter present. Pt also on LTM EEG. Pt demonstrates left hemiparesis, impaired cognition, left visual deficit. Able to visually cross midline to L with cueing. Pt initiating well progressing to edge of bed, however, deferred and returned to supine due to stool incontinence. Pt performed rolling multiple times with min-maxA (increased difficulty rolling to R vs L). +2 assist helpful to hold pt hands to maintain integrity of equipment. Suspect next session with discontinuation of LTM EEG, will be able to transition out of bed to further assess. Patient will benefit from intensive inpatient follow-up therapy, >3 hours/day.      If plan is discharge home, recommend the following: Two people to help with walking and/or transfers;Two people to help with bathing/dressing/bathroom   Can travel by private vehicle        Equipment Recommendations None recommended by PT  Recommendations for Other Services  Rehab consult    Functional Status Assessment Patient has had a recent decline in their functional status and demonstrates the ability to make significant improvements in function in a reasonable and predictable amount of time.     Precautions / Restrictions Precautions Precautions: Fall;Other  (comment) Recall of Precautions/Restrictions: Impaired Precaution/Restrictions Comments: L homonymous hemianopsia, bilateral mitts, NGT, LTM EEG Restrictions Weight Bearing Restrictions Per Provider Order: No      Mobility  Bed Mobility Overal bed mobility: Needs Assistance Bed Mobility: Rolling Rolling: Mod assist, +2 for physical assistance         General bed mobility comments: pt fluctuating needing min A - max A to roll at bed level, assist to bring hips and hold hands in sidelying as pt attmpting to pull at lines and roll to back    Transfers                   General transfer comment: deferred    Ambulation/Gait                  Stairs            Wheelchair Mobility     Tilt Bed    Modified Rankin (Stroke Patients Only) Modified Rankin (Stroke Patients Only) Pre-Morbid Rankin Score: Moderately severe disability Modified Rankin: Severe disability     Balance                                             Pertinent Vitals/Pain Pain Assessment Pain Assessment: No/denies pain    Home Living Family/patient expects to be discharged to:: Private residence Living Arrangements: Children (son is 24/7 caregiver per pt and RN) Available Help at Discharge: Family;Available 24 hours/day Type of Home: Apartment Home Access: Level entry  Home Layout: One level Home Equipment: Grab bars - tub/shower;Cane - single point;Toilet riser Additional Comments: info taken from prior admission    Prior Function Prior Level of Function : Independent/Modified Independent             Mobility Comments: cane PRN ADLs Comments: son assists with ADLs     Extremity/Trunk Assessment   Upper Extremity Assessment Upper Extremity Assessment: Defer to OT evaluation RUE Deficits / Details: overall functionally, 3+/5 globally LUE Deficits / Details: overall functional, 3/5 globally    Lower Extremity Assessment Lower Extremity  Assessment: RLE deficits/detail;LLE deficits/detail RLE Deficits / Details: Moves spontaneously, at least 3/5 LLE Deficits / Details: Appears slightly weaker than R, grossly 2-/5    Cervical / Trunk Assessment Cervical / Trunk Assessment: Kyphotic  Communication   Communication Communication: Impaired Factors Affecting Communication: Difficulty expressing self (delayed)    Cognition Arousal: Alert Behavior During Therapy: Restless   PT - Cognitive impairments: Orientation, Awareness, Memory, Attention, Initiation, Sequencing, Problem solving, Safety/Judgement   Orientation impairments: Place, Time, Situation                   PT - Cognition Comments: Pt A&O to self and birthday only. Pt able to state she was in hospital when given two options. Very restless, pulling at lines, but overall pleasant and non combative Following commands: Impaired Following commands impaired: Follows one step commands inconsistently     Cueing Cueing Techniques: Verbal cues, Tactile cues     General Comments General comments (skin integrity, edema, etc.): VSS on RA    Exercises     Assessment/Plan    PT Assessment Patient needs continued PT services  PT Problem List Decreased strength;Decreased activity tolerance;Decreased balance;Decreased mobility;Decreased cognition;Decreased safety awareness       PT Treatment Interventions DME instruction;Gait training;Functional mobility training;Therapeutic activities;Therapeutic exercise;Balance training;Cognitive remediation;Neuromuscular re-education;Patient/family education    PT Goals (Current goals can be found in the Care Plan section)  Acute Rehab PT Goals Patient Stated Goal: unable PT Goal Formulation: Patient unable to participate in goal setting Time For Goal Achievement: 04/05/24 Potential to Achieve Goals: Fair    Frequency Min 3X/week     Co-evaluation PT/OT/SLP Co-Evaluation/Treatment: Yes Reason for Co-Treatment:  Complexity of the patient's impairments (multi-system involvement);For patient/therapist safety;To address functional/ADL transfers PT goals addressed during session: Mobility/safety with mobility OT goals addressed during session: ADL's and self-care       AM-PAC PT 6 Clicks Mobility  Outcome Measure Help needed turning from your back to your side while in a flat bed without using bedrails?: A Lot Help needed moving from lying on your back to sitting on the side of a flat bed without using bedrails?: A Lot Help needed moving to and from a bed to a chair (including a wheelchair)?: Total Help needed standing up from a chair using your arms (e.g., wheelchair or bedside chair)?: Total Help needed to walk in hospital room?: Total Help needed climbing 3-5 steps with a railing? : Total 6 Click Score: 8    End of Session   Activity Tolerance: Patient tolerated treatment well Patient left: in bed;with call bell/phone within reach;with bed alarm set;with nursing/sitter in room Nurse Communication: Mobility status PT Visit Diagnosis: Unsteadiness on feet (R26.81);Difficulty in walking, not elsewhere classified (R26.2);Other symptoms and signs involving the nervous system (R29.898);Hemiplegia and hemiparesis Hemiplegia - Right/Left: Left Hemiplegia - dominant/non-dominant: Non-dominant Hemiplegia - caused by: Cerebral infarction    Time: 8695-8674 PT Time Calculation (min) (ACUTE ONLY): 21  min   Charges:   PT Evaluation $PT Eval Moderate Complexity: 1 Mod   PT General Charges $$ ACUTE PT VISIT: 1 Visit         Aleck Daring, PT, DPT Acute Rehabilitation Services Office [phone]   Aleck ONEIDA Daring 03/22/2024, 2:11 PM

## 2024-03-22 NOTE — Progress Notes (Signed)
 Subjective:  Megan Fischer is a 73 y.o. with a pertinent PMH of DM2, Afib (on eliquis ), CKD4, CVA (01/2024), and past multinodular goiter, who presented with headache and agitation who was admitted for encephalopathy. 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   Overnight events: Heparin stopped 2/2 petechial hemorrhage of prior R PCA infarct.  Today, she denies acute complaints.   Objective:  Vital signs in last 24 hours: Vitals:   03/21/24 2013 03/21/24 2324 03/22/24 0410 03/22/24 0500  BP: (!) 151/115 (!) 136/115 (!) 128/91   Pulse:  94 85   Resp: 18 18 18    Temp: 97.7 F (36.5 C) 97.7 F (36.5 C) 97.8 F (36.6 C)   TempSrc: Oral  Oral   SpO2:      Weight:    82 kg  Height:       Physical Exam: General:NAD Cardiac:IRIR, no murmur appreciated  Pulmonary:normal effort on room air, no accessory respiratory muscles usage, no tachypnea appreciated Neuro:awake, oriented to self and year, no facial droop, inattentive, answers with one worded-few worded phrases  Skin:warm and dry Psych: inattentive      Latest Ref Rng & Units 03/22/2024    5:27 AM 03/21/2024    5:00 AM 03/20/2024    4:46 AM  CBC  WBC 4.0 - 10.5 K/uL 7.3  8.5  9.8   Hemoglobin 12.0 - 15.0 g/dL 85.8  84.4  84.6   Hematocrit 36.0 - 46.0 % 42.0  45.9  45.1   Platelets 150 - 400 K/uL 200  254  225         Latest Ref Rng & Units 03/22/2024    5:27 AM 03/21/2024    9:00 AM 03/21/2024    5:00 AM  BMP  Glucose 70 - 99 mg/dL 830  858  820   BUN 8 - 23 mg/dL 34  21  17   Creatinine 0.44 - 1.00 mg/dL 7.67  8.33  8.67   Sodium 135 - 145 mmol/L 128  132  131   Potassium 3.5 - 5.1 mmol/L 3.7  4.0  4.3   Chloride 98 - 111 mmol/L 94  94  94   CO2 22 - 32 mmol/L 23  25  22    Calcium  8.9 - 10.3 mg/dL 8.3  8.6  8.4      Assessment/Plan:  Principal Problem:   Acute ischemic right MCA stroke  (HCC) Active Problems:   Atrial fibrillation (HCC)   Hypertension   DM (diabetes mellitus) type II controlled with renal manifestation (HCC)   CKD (chronic kidney disease) stage 3, GFR 30-59 ml/min (HCC)   Abdominal pain, epigastric   Hemiplegia and hemiparesis following cerebral infarction affecting left non-dominant side (HCC)   Headache   Vomiting   Hyperthyroidism   Delirium   High anion gap metabolic acidosis   Lactic acidosis   Euglycemic DKA (diabetic ketoacidosis) (HCC)   Pulmonary artery hypertension (HCC)   Right ventricular dysfunction   History of recent stroke   Pulmonary embolism (HCC)   Hypercoagulable state   AKI (acute kidney injury)   Malnutrition of moderate degree   H/O ischemic right PCA stroke  #Encephalopathy 2/2 acute R MCA ischemic stroke #H/o R PCA stroke (09/20225) MRI of brain revealed an acute R MCA stroke with petechial hemorrhage of the prior R PCA stroke (09/20225) and heparin was discontinued at that time. This  morning, patient was more awake and oriented to self & year; however, she remained inattentive during exam. Suspect patient's CVA is embolic 2/2 the afib. With her wax and wanes in mentation, concern for seizures with her acute stroke.  Plan: -Resume heparin with stroke protocol -Continue permissive HTN today -Asa 81 mg & Crestor  20 mg  -F/u EEG -F/u transcranial bubble study  -PMT consulted  -Continue with coretrack for nutrition for this time  #PE, acute Patient was found to have a right lower lobe PE and was started on a heparin drip that has since been discontinued 2/2 petechial hemorrhage in her prior R PCA infarct. We dicussed patient's case with 2 different IR physicians who do NOT suspect a submassive PE and therefore did NOT offer thrombectomy in this patient. She remains HDS on RA.  Plan: -Resume heparin today with the stroke protocol  -F/u BL LE DVT US   #AKI Patient's serum creatinine increased from 1.32 one day ago to 2.32  today. I suspect her AKI is due to contrast nephropathy from the CTA of the chest and CTA of the abdomen and pelvis performed 2 days prior. Bladder scan revealed 192cc, unsure whether this is PVR or not.  Plan: -1 L IVF bolus this morning  -Bladder scan at 11 am to assess if she is retaining, will I&O cath  #T2DM S/p treatment of euglycemic DKA and persistent lactic acidosis. Patient's FBG is at goal at 163 and anion gap has remained closed at 11.  Plan: -Continue Semglee 10 units -Continue Moderate SSI  #Hyperthyroidism: -Continue methimazole  10 mg daily and cholestyramine 4g QID  Resolved Problems:  Euglycemic DKA Lactic acidosis  __________________________________  Code Status: DNR/DNI VTE Prophylaxis:SCDs Diet:NPO, Coretrack  IVF:N/A Barriers to Discharge:Evaluation of CVA vs PE Dispo: Anticipated discharge in approximately 2-3 day(s).   Kandis Perkins, DO 03/22/2024, 9:33 AM Please contact the on call pager at: [phone]

## 2024-03-22 NOTE — Consult Note (Signed)
 Palliative Care Consult Note                                  Date: 03/22/2024   Patient Name: Megan Fischer  DOB: 06-10-1950  MRN: [id number]  Age / Sex: 73 y.o., female  PCP: Bernadine Manos, MD Referring Physician: Eben Reyes BROCKS, MD  Reason for Consultation: Establishing goals of care  HPI/Patient Profile: 73 y.o. female  with past medical history of DM2, Afib (on eliquis ), CKD4, CVA (01/2024), and past multinodular goiter admitted on 03/18/2024 with headache and agitation.  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   Past Medical History:  Diagnosis Date   Atrial fibrillation (HCC)    Chronic disease anemia 03/29/2011   Diabetes mellitus    Dyslipidemia    Hypertension    Obesities, morbid (HCC)    Osteoarthritis     Subjective:   I have reviewed medical records including EPIC notes, labs and imaging, received updates from nursing and attending team, assessed the patient and then met with the patient's son Mohammed, daughter-in-law, and daughter Mylinda (by phone) to discuss diagnosis prognosis, GOC, EOL wishes, disposition and options.  I introduced Palliative Medicine as specialized medical care for people living with serious illness. It focuses on providing relief from symptoms and stress of a serious illness. The goal is to improve quality of life for both the patient and the family.  Today's Discussion: Patient in bed in no apparent distress. She is connected to EEG. She is in mitts and has sitter at bedside. She is oriented to self but appears to recognize her family. While she worked with therapies I met in family room with family.  Family shared their understanding of her chronic conditions and current hospitalization. We discussed the patient's DKA, pulmonary embolus, and acute right MCA infarct.  Family share the patient has had issues with blood clots throughout the decades. Family is excited to see improvement in her mental status since last night. Encouraged them to be cautiously optimistic-- we discussed that patient will likely have ups and downs during the hospitalization. Family are hopeful the patient can have a meaningful improvement.  After the patient had several falls she moved in with her son and daughter in law. This was around eight months ago. The patient had a stroke in September. She went to [facility] for rehab then continued PT at home. She had a little residual weakness on her left side but otherwise recovered from her stroke. 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 She is widowed. She has two children, several grandchildren, and a great grandchild. The patient enjoys staying active and shopping.   During the last hospitalization the son worked with SW to start the process of getting him approved as an presenter, broadcasting. (Reached out to SW about status- they will look into on Monday. Offered chaplain support- family believes patient would appreciate prayer and support-- consult placed.  A discussion was had today regarding advanced directives. The patient was in the process of completing HCPOA naming her son as proxy but the form was not witnessed/notarized. We discussed code status  and scopes of care. Family confirm patient is DNR/DNI. Family would like to continue treating the treatable and time for outcomes.   Discussed the importance of continued conversation with family and the medical providers regarding overall plan of care and treatment options, ensuring decisions are within the context of the patient's values and GOCs.  Emotional support and therapeutic listening provided. Questions and concerns were  addressed. The family was encouraged to call with questions or concerns. PMT will continue to support holistically.  Review of Systems  Unable to perform ROS   Objective:   Primary Diagnoses: Present on Admission:  Hypertension  Atrial fibrillation (HCC)  DM (diabetes mellitus) type II controlled with renal manifestation (HCC)  Abdominal pain, epigastric  Headache  Vomiting  CKD (chronic kidney disease) stage 3, GFR 30-59 ml/min (HCC)  Acute ischemic right MCA stroke [facility])   Physical Exam Vitals reviewed.  Constitutional:      General: She is awake. She is not in acute distress.    Comments: Cortrak, mitts  Cardiovascular:     Rate and Rhythm: Normal rate.  Pulmonary:     Effort: Pulmonary effort is normal.  Skin:    General: Skin is warm and dry.  Neurological:     Comments: Oriented to self  Psychiatric:        Behavior: Behavior is agitated.     Vital Signs:  BP (!) 128/91 (BP Location: Left Arm)   Pulse 85   Temp 97.8 F (36.6 C) (Oral)   Resp 18   Ht 5' 3 (1.6 m)   Wt 82 kg   SpO2 98%   BMI 32.02 kg/m     Advanced Care Planning:   Existing Vynca/ACP Documentation: None  Primary Decision Maker: NEXT OF KIN  Code Status/Advance Care Planning: DNR  Assessment & Plan:   SUMMARY OF RECOMMENDATIONS   DNR/DNI Treat the treatable allowing time for outcomes Spiritual care for support TOC notified re: caregiver status for son Continued PMT support    Discussed with: sitter, bedside RN, and attending team  Time Total: 90 minutes    Thank you for allowing us  to participate in the care of TYSHAWN KEEL PMT will continue to support holistically.   Signed by: Stephane Palin, NP Palliative Medicine Team  Team Phone # [phone] (Nights/Weekends)  03/22/2024, 1:40 PM

## 2024-03-22 NOTE — Evaluation (Signed)
 Speech Language Pathology Evaluation Patient Details Name: Megan Fischer MRN: [id number] DOB: 1950-05-28 Today's Date: 03/22/2024 Time: 8664-8647 SLP Time Calculation (min) (ACUTE ONLY): 17 min  Problem List:  Patient Active Problem List   Diagnosis Date Noted   H/O ischemic right PCA stroke 03/22/2024   Pulmonary embolism (HCC) 03/21/2024   Hypercoagulable state 03/21/2024   AKI (acute kidney injury) 03/21/2024   Malnutrition of moderate degree 03/21/2024   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   Right ventricular dysfunction 03/20/2024   History of recent stroke 03/20/2024   Hyperthyroidism 03/19/2024   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   Delirium 03/19/2024   High anion gap metabolic acidosis 03/19/2024   Lactic acidosis 03/19/2024   Euglycemic DKA (diabetic ketoacidosis) (HCC) 03/19/2024   Hyperlipemia 03/18/2024   Thyroid  storm 03/18/2024   Encephalopathy 03/18/2024   Abdominal pain, epigastric 03/18/2024   Hemiplegia and hemiparesis following cerebral infarction affecting left non-dominant side (HCC) 03/18/2024   Headache 03/18/2024   Vomiting 03/18/2024   Left shoulder pain 02/18/2024   CKD (chronic kidney disease) stage 3, GFR 30-59 ml/min (HCC) 01/31/2024   Stroke (HCC) 01/21/2024   Acute ischemic right MCA stroke (HCC) 01/21/2024   Cellulitis of left finger 09/19/2023   Chest pain 09/19/2023   Leg pain 03/02/2023   Acute respiratory failure with hypoxia (HCC) 04/11/2021   Acute on chronic diastolic CHF (congestive heart failure) (HCC) 04/11/2021   Hypercholesterolemia 09/17/2018   Iron deficiency anemia 06/24/2017   Anemia of chronic disease 07/20/2016   Healthcare maintenance 06/09/2013   Multinodular goiter 01/20/2013   Chronic disease anemia 03/29/2011   Obesities, morbid (HCC)    Hypertension    Dyslipidemia    DM (diabetes mellitus) type II controlled with renal manifestation (HCC)    Atrial fibrillation (HCC) 08/04/2010   Past Medical History:   Past Medical History:  Diagnosis Date   Atrial fibrillation (HCC)    Chronic disease anemia 03/29/2011   Diabetes mellitus    Dyslipidemia    Hypertension    Obesities, morbid (HCC)    Osteoarthritis    Past Surgical History:  Past Surgical History:  Procedure Laterality Date   CHOLECYSTECTOMY     REPLACEMENT TOTAL KNEE BILATERAL     SHOULDER SURGERY     RIGHT SHOULDER   TUBAL LIGATION     HPI:  73 yo female [facility] with DKA, acute encephalopathy being treated via the protocol. Having nausea, vomitng and abdominal discomfort. CTH revealed no acute process. Ct chest Right lower lobe segmental pulmonary emboli. Positive for acute PE, New scattered left upper lobe pulmonary nodules measuring up to 5 mm, possibly infectious/inflammatory, but indeterminate. BSE 01/25/24 BSE on CIR, no difficulty swallowing and reg/thin and no f/u; BSE on 11/14 placed on Dysphagia 1/puree and has Cortrak.  ST f/u for diet tolerance/advancement and SLE.   Assessment / Plan / Recommendation Clinical Impression  Recommend ST for cognitive/linguistic deficits in acute setting; pt demonstrates deficits in the areas of sustained attention, orientation, problem solving, safety awareness and auditory comprehension.  Recommend ST at next venue of care.  Pt assessed via informal observations, chart review, family report and portions of Cognistat.  Pt oriented to self and place and not situation nor time.  She exhibited decreased sustained attention/awareness as she constantly pulled at safety mitts during assessment despite verbal cueing to halt behavior and is currently requiring a sitter in her room.  She was able to answer simple questions and would state yes/no when asked simple questions during po  intake trial.  She followed 1-2 step simple functional commands during session and was able to identify her family members when they arrived for a visit.  Pt's cognition should continue to be assessed during acute  stay as it is waxing/waning per report.    SLP Assessment  SLP Recommendation/Assessment: Patient needs continued Speech Language Pathology Services SLP Visit Diagnosis: Cognitive communication deficit (R41.841)     Assistance Recommended at Discharge  Frequent or constant Supervision/Assistance  Functional Status Assessment Patient has had a recent decline in their functional status and/or demonstrates limited ability to make significant improvements in function in a reasonable and predictable amount of time  Frequency and Duration min 2x/week  1 week      SLP Evaluation Cognition  Overall Cognitive Status: Impaired/Different from baseline Arousal/Alertness: Awake/alert Orientation Level: Oriented to person;Oriented to place;Disoriented to situation;Disoriented to time Attention: Sustained Sustained Attention: Impaired Sustained Attention Impairment: Verbal basic;Functional basic Memory: Impaired Memory Impairment: Decreased short term memory;Retrieval deficit;Decreased recall of new information Decreased Short Term Memory: Verbal basic;Functional basic Awareness: Impaired Awareness Impairment: Anticipatory impairment Problem Solving: Impaired Problem Solving Impairment: Verbal basic;Functional basic Behaviors: Impulsive;Restless;Perseveration Safety/Judgment: Impaired Comments: Attempting to remove tubes/TF; has mitts       Comprehension  Auditory Comprehension Overall Auditory Comprehension: Impaired at baseline Commands: Impaired Multistep Basic Commands: 0-24% accurate Conversation: Other (comment) (limited) Visual Recognition/Discrimination Discrimination: Not tested Reading Comprehension Reading Status: Not tested    Expression Expression Primary Mode of Expression: Verbal Verbal Expression Overall Verbal Expression: Impaired Level of Generative/Spontaneous Verbalization: Phrase Naming: Impairment Responsive: 26-50% accurate Confrontation:  Impaired Convergent: 25-49% accurate Verbal Errors: Perseveration Pragmatics: Unable to assess Interfering Components: Attention;Premorbid deficit Non-Verbal Means of Communication: Not applicable Written Expression Written Expression: Unable to assess (comment)   Oral / Motor  Oral Motor/Sensory Function Overall Oral Motor/Sensory Function: Within functional limits Motor Speech Overall Motor Speech: Appears within functional limits for tasks assessed Respiration: Within functional limits Phonation: Low vocal intensity Resonance: Within functional limits Articulation: Within functional limitis Intelligibility: Intelligible Motor Planning: Within functional limits Motor Speech Errors: Not applicable            Bruna Clause, M.S., CCC-SLP 03/22/2024, 2:49 PM

## 2024-03-22 NOTE — Progress Notes (Signed)
 STROKE TEAM PROGRESS NOTE   SUBJECTIVE (INTERVAL HISTORY) Her sitter is at the bedside.  Patient lying in bed, flat affect, intermittent agitation, trying to get out of bed.  Limited language output, not cooperative with exam.  Long-term EEG ongoing.   OBJECTIVE Temp:  [97.7 F (36.5 C)-97.8 F (36.6 C)] 97.8 F (36.6 C) (11/15 1500) Pulse Rate:  [85-94] 85 (11/15 0410) Cardiac Rhythm: Atrial fibrillation (11/15 0700) Resp:  [18-20] 20 (11/15 1500) BP: (128-157)/(91-115) 157/98 (11/15 1500) SpO2:  [97 %] 97 % (11/15 1500) Weight:  [82 kg] 82 kg (11/15 0500)  Recent Labs  Lab 03/21/24 1609 03/21/24 2255 03/22/24 0635 03/22/24 1305 03/22/24 1603  GLUCAP 145* 159* 163* 199* 254*   Recent Labs  Lab 03/18/24 1446 03/18/24 1453 03/20/24 0446 03/20/24 0951 03/20/24 1802 03/20/24 2200 03/21/24 0500 03/21/24 0900 03/21/24 1850 03/22/24 0527  NA 140   < > 130*   < > 130* 130* 131* 132*  --  128*  K 3.8   < > 3.3*   < > 3.5 3.0* 4.3 4.0  --  3.7  CL 106   < > 91*   < > 93* 93* 94* 94*  --  94*  CO2 20*   < > 22   < > 23 27 22 25   --  23  GLUCOSE 149*   < > 133*   < > 170* 155* 179* 141*  --  169*  BUN 18   < > 19   < > 18 16 17 21   --  34*  CREATININE 1.15*   < > 1.08*   < > 1.14* 1.12* 1.32* 1.66*  --  2.32*  CALCIUM  9.4   < > 8.6*   < > 8.8* 8.3* 8.4* 8.6*  --  8.3*  MG 1.7  --  1.4*  --   --   --   --   --   --  2.2  PHOS  --   --   --   --   --   --   --   --  3.7 4.3   < > = values in this interval not displayed.   Recent Labs  Lab 03/19/24 0006 03/19/24 0215 03/20/24 1218 03/21/24 0500  AST 23 28 40 25  ALT 14 15 21 14   ALKPHOS 103 104 115 105  BILITOT 0.9 1.2 1.2 0.9  PROT 7.5 7.7 8.1 6.9  ALBUMIN 3.6 3.6 3.3* 2.9*   Recent Labs  Lab 03/18/24 1446 03/18/24 1453 03/19/24 0215 03/19/24 1529 03/20/24 0446 03/21/24 0500 03/22/24 0527  WBC 4.8  --  6.8 6.6 9.8 8.5 7.3  NEUTROABS 3.1  --   --  5.4  --  6.8  --   HGB 14.0   < > 12.4 14.6 15.3* 15.5*  14.1  HCT 43.8   < > 38.5 42.9 45.1 45.9 42.0  MCV 88.3  --  87.9 84.4 83.7 83.2 84.5  PLT 188  --  172 195 225 254 200   < > = values in this interval not displayed.   Recent Labs  Lab 03/19/24 1529 03/21/24 0500  CKTOTAL 320* 148   No results for input(s): LABPROT, INR in the last 72 hours. No results for input(s): COLORURINE, LABSPEC, PHURINE, GLUCOSEU, HGBUR, BILIRUBINUR, KETONESUR, PROTEINUR, UROBILINOGEN, NITRITE, LEUKOCYTESUR in the last 72 hours.  Invalid input(s): APPERANCEUR     Component Value Date/Time   CHOL 155 03/22/2024 0527   CHOL 201 (H) 03/01/2023 1151  TRIG 108 03/22/2024 0527   HDL 46 03/22/2024 0527   HDL 108 03/01/2023 1151   CHOLHDL 3.4 03/22/2024 0527   VLDL 22 03/22/2024 0527   LDLCALC 87 03/22/2024 0527   LDLCALC 83 03/01/2023 1151   Lab Results  Component Value Date   HGBA1C 6.5 (H) 01/22/2024      Component Value Date/Time   LABOPIA NONE DETECTED 03/19/2024 0006   COCAINSCRNUR NONE DETECTED 03/19/2024 0006   LABBENZ POSITIVE (A) 03/19/2024 0006   AMPHETMU NONE DETECTED 03/19/2024 0006   THCU NONE DETECTED 03/19/2024 0006   LABBARB NONE DETECTED 03/19/2024 0006    Recent Labs  Lab 03/19/24 1042  ETH <15    I have personally reviewed the radiological images below and agree with the radiology interpretations.  CT ANGIO HEAD W OR WO CONTRAST Result Date: 03/21/2024 EXAM: CT HEAD WITHOUT CTA HEAD WITH AND WITHOUT 03/21/2024 06:20:00 PM TECHNIQUE: CT of the head was performed without the administration of intravenous contrast followed by a CTA of the head was performed with and without the administration of intravenous contrast. Multiplanar 2D and/or 3D reformatted images are provided for review. Automated exposure control, iterative reconstruction, and/or weight based adjustment of the mA/kV was utilized to reduce the radiation dose to as low as reasonably achievable. 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 FINDINGS: CT HEAD: BRAIN AND VENTRICLES: The evolving right occipital and posterior inferior temporal lobe nonhemorrhagic infarct is again noted. The acute infarct of the posterior limb right internal capsule and insular ribbon is noted. No acute intracranial hemorrhage is present. No mass effect or midline shift. No abnormal extra-axial fluid collection. There is no hydrocephalus. There is no abnormal enhancement. ORBITS: The visualized portion of the orbits demonstrate no acute abnormality. SINUSES: The visualized paranasal sinuses and mastoid air cells demonstrate no acute abnormality. SOFT TISSUES AND SKULL: Left sided NG tube is in place. No acute abnormality of the visualized skull or soft tissues. CTA HEAD: ANTERIOR CIRCULATION: Extensive atherosclerotic changes are present in the internal carotid arteries bilaterally. 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 It is reconstituted at the level of the ophthalmic artery. The ICA terminus is opacified. Marked decreased caliber opacification is present throughout the right MCA territory suggesting low perfusion. The anterior communicating artery is patent. Asymmetric attenuation of distal right AC branch vessels is noted. No aneurysm. POSTERIOR CIRCULATION: The vertebral arteries are codominant. Mild atherosclerotic changes are present within the vertebral arteries. Moderate focal stenosis is present in the distal right P2 segment with asymmetric attenuation of distal right PCA branches including the infarct territory. No significant stenosis of the basilar artery. No aneurysm. OTHER: No dural venous sinus thrombosis on this non-dedicated study. IMPRESSION: 1. Acute non-hemorrhagic infarct in the posterior limb of the right internal capsule . 2. Evolving nonhemorrhagic infarct in the right occipital and posterior inferior temporal lobes. 3. 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 4. Marked decreased caliber opacification throughout the right MCA territory, suggesting low perfusion. 5. Moderate focal stenosis of the distal right P2 segment with asymmetric attenuation of distal right PCA branches. 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 Proximal tibial resection with Biomet tibial replacement. New pulmonary embolism. Concern for  recurrent cancer. EXAM: MRI OF LOWER LEFT EXTREMITY WITHOUT AND WITH CONTRAST TECHNIQUE: Multiplanar, multisequence MR imaging of the left lower leg was performed both before and after administration of intravenous contrast. CONTRAST:  8mL GADAVIST GADOBUTROL 1 MMOL/ML IV SOLN COMPARISON:  Limited correlation made with left knee radiographs 04/22/2022 and lower leg radiographs 10/17/2006. No prior cross sectional imaging studies of the left lower leg available. FINDINGS: Technical note: Despite efforts by the technologist and patient, mild motion artifact is present on today's exam and could not be eliminated. This reduces exam sensitivity and specificity. Bones/Joint/Cartilage Both lower legs are included on the coronal images. Patient is status post bilateral total knee arthroplasty. There is a long tibial component which extends into the distal diaphysis. In addition, there is a distal left fibular plate and screws. There is significant susceptibility artifact related to the surgical hardware. Allowing for the artifact, no acute osseous findings or obvious recurrent tumor demonstrated. There is fluid signal along the medial aspect of the proximal tibia without apparent significant enhancement, possibly indicating pes anserine bursitis. Ligaments Not relevant for exam/indication. Muscles and  Tendons Generalized asymmetric muscular atrophy throughout the left lower leg. Soft tissues As above, fluid signal along the medial aspect of the proximal tibia which may reflect pes anserine bursitis. No other focal fluid collections are identified. There is mild subcutaneous edema in the left lower leg. IMPRESSION: 1. No evidence of recurrent tumor in the left lower leg. Assessment limited by artifact from the surgical hardware. If there is persistent concern of recurrent tumor, recommend radiographic correlation and consideration of CT. 2. Fluid signal along the medial aspect of the proximal tibia may reflect pes anserine bursitis. 3. Generalized asymmetric muscular atrophy throughout the left lower leg. 4. Postsurgical changes as described. Electronically Signed   By: [name] Perone M.D.   On: 03/21/2024 15:56   MR BRAIN WO CONTRAST Result Date: 03/21/2024 EXAM: MRI BRAIN WITHOUT CONTRAST 03/21/2024 02:41:50 PM TECHNIQUE: Multiplanar multisequence MRI of the head/brain was performed without the administration of intravenous contrast. COMPARISON: MRI Brain 01/21/2024, CT head 03/18/2024, CTA head/neck 01/21/2024. CLINICAL HISTORY: encephalopathy FINDINGS: BRAIN AND VENTRICLES: Redemonstration of extensive subacute infarct in the right PCA territory with underlying chronic right PCA territory infarct. New areas of cortical restricted diffusion along the right insula and mesial right temporal lobe suspicious for acute infarct. Diminished right ICA flow void, consistent with known occlusion. No intracranial hemorrhage. No mass. No midline shift. No hydrocephalus. The sella is unremarkable. ORBITS: No acute abnormality. SINUSES AND MASTOIDS: No acute abnormality. BONES AND SOFT TISSUES: Normal marrow signal. No acute soft tissue abnormality. IMPRESSION: 1. New areas of cortical restricted diffusion along the right insula and mesial right temporal lobe, suspicious for acute MCA territory infarct. 2. Extensive  subacute infarct in the right PCA territory. 3. Diminished right ICA flow void, consistent with known occlusion. Electronically signed by: Ryan Chess MD 03/21/2024 02:52 PM EST RP Workstation: [id number]   DG Abd Portable 1V Result Date: 03/21/2024 EXAM: 1 VIEW XRAY OF THE ABDOMEN 03/21/2024 02:01:00 PM COMPARISON: CT abdomen 03/20/2024. CLINICAL HISTORY: Encounter for feeding tube placement. FINDINGS: LINES, TUBES AND DEVICES: Feeding tube in place with tip in the duodenal bulb. Left PICC line in place with tip in the lower SVC region. BOWEL: Nonobstructive bowel gas pattern. SOFT TISSUES: Cholecystectomy clips noted. Aortic atheromatous vascular calcification. No opaque urinary calculi. BONES: Thoracolumbar spondylosis with levoconvex lumbar scoliosis with rotary component. LUNGS: The left lung apex is omitted. IMPRESSION: 1. Feeding tube in  place with tip in the duodenal bulb. 2. Left PICC line in place with tip in the lower SVC region. 3. Thoracolumbar spondylosis with levoconvex lumbar scoliosis with rotary component. 4. Atherosclerosis. Electronically signed by: Ryan Salvage MD 03/21/2024 02:33 PM EST RP Workstation: [id number]   CT Angio Chest Pulmonary Embolism (PE) W or WO Contrast Result Date: 03/20/2024 CLINICAL DATA:  High probability for pulmonary embolism. EXAM: CT ANGIOGRAPHY CHEST WITH CONTRAST TECHNIQUE: Multidetector CT imaging of the chest was performed using the standard protocol during bolus administration of intravenous contrast. Multiplanar CT image reconstructions and MIPs were obtained to evaluate the vascular anatomy. RADIATION DOSE REDUCTION: This exam was performed according to the departmental dose-optimization program which includes automated exposure control, adjustment of the mA and/or kV according to patient size and/or use of iterative reconstruction technique. CONTRAST:  50mL OMNIPAQUE  IOHEXOL  350 MG/ML SOLN COMPARISON:  CT angiogram chest 02/18/2024. FINDINGS:  Cardiovascular: There are right lower lobe segmental pulmonary emboli. The heart is enlarged. Main pulmonary artery is mildly enlarged. There are atherosclerotic calcifications of the aorta and coronary arteries. There is no pericardial effusion. Aorta is normal in size. Left-sided central venous catheter ends in the SVC. Mediastinum/Nodes: The thyroid  gland is markedly enlarged and heterogeneous containing numerous nodules. The largest is in the right lobe measuring 3.2 cm and appears unchanged. There are no enlarged lymph nodes. The esophagus is within normal limits. Lungs/Pleura: There are few new scattered left upper lobe pulmonary nodular densities measuring up to 5 mm. Other smaller pulmonary nodules in the right lung are stable from prior. There is dependent atelectasis bilaterally. There is no new lung consolidation, pleural effusion or pneumothorax. Upper Abdomen: No acute abnormality. Cholecystectomy clips are present. Musculoskeletal: There is severe degenerative changes of the right shoulder and postsurgical changes. Degenerative changes affect the midthoracic spine. Review of the MIP images confirms the above findings. IMPRESSION: 1. Right lower lobe segmental pulmonary emboli. Positive for acute PE with CTevidence of right heart strain (RV/LV Ratio = 1.1) consistent with at least submassive (intermediate risk) PE. The presence of right heart strain has been associated with an increased risk of morbidity and mortality. 2. Stable cardiomegaly. 3. Stable enlarged main pulmonary artery compatible with pulmonary arterial hypertension. 4. New scattered left upper lobe pulmonary nodules measuring up to 5 mm, possibly infectious/inflammatory, but indeterminate. No follow-up needed if patient is low-risk (and has no known or suspected primary neoplasm). Non-contrast chest CT can be considered in 12 months if patient is high-risk. This recommendation follows the consensus statement: Guidelines for Management of  Incidental Pulmonary Nodules Detected on CT Images: From the Fleischner Society 2017; Radiology 2017; 284:228-243. 5. Stable enlarged heterogeneous thyroid  gland with multiple nodules. Follow-up thyroid  ultrasound recommended. Aortic Atherosclerosis (ICD10-I70.0). Electronically Signed   By: Greig Pique M.D.   On: 03/20/2024 20:10   DG CHEST PORT 1 VIEW Result Date: 03/20/2024 EXAM: 1 VIEW(S) XRAY OF THE CHEST 03/20/2024 05:11:00 PM COMPARISON: 03/18/2024, 02/18/2024 CLINICAL HISTORY: S/P PICC central line placement FINDINGS: LINES, TUBES AND DEVICES: Left PICC with tip overlying the right atrium. LUNGS AND PLEURA: Low lung volumes. No focal pulmonary opacity. No pleural effusion. No pneumothorax. HEART AND MEDIASTINUM: Aortic atherosclerosis. No acute abnormality of the cardiac and mediastinal silhouettes. BONES AND SOFT TISSUES: Right shoulder surgical screw noted. No acute osseous abnormality. IMPRESSION: 1. Left PICC with tip overlying the right atrium. 2. Low lung volumes. Otherwise, no pneumonia or pulmonary edema. No pneumothorax. Electronically signed by: Rogelia Myers MD 03/20/2024 05:28 PM EST  RP Workstation: [id number]   CT Angio Abd/Pel w/ and/or w/o Result Date: 03/20/2024 EXAM: CTA ABDOMEN AND PELVIS WITHOUT AND WITH CONTRAST 03/20/2024 11:35:00 AM TECHNIQUE: CTA images of the abdomen and pelvis without and with intravenous contrast. 100 mL of iohexol  (OMNIPAQUE ) 350 MG/ML injection was administered. Three-dimensional MIP/volume rendered formations were performed. Automated exposure control, iterative reconstruction, and/or weight based adjustment of the mA/kV was utilized to reduce the radiation dose to as low as reasonably achievable. COMPARISON: 03/18/2024. CLINICAL HISTORY: Mesenteric ischemia, acute. FINDINGS: VASCULATURE: AORTA: No acute finding. No abdominal aortic aneurysm. No dissection. CELIAC TRUNK: No acute finding. No occlusion or significant stenosis. SUPERIOR MESENTERIC  ARTERY: No acute finding. No occlusion or significant stenosis. RENAL ARTERIES: No acute finding. No occlusion or significant stenosis. ILIAC ARTERIES: No acute finding. No occlusion or significant stenosis. LIVER: The liver is unremarkable. GALLBLADDER AND BILE DUCTS: Status post cholecystectomy. No biliary ductal dilatation. SPLEEN: The spleen is unremarkable. PANCREAS: The pancreas is unremarkable. ADRENAL GLANDS: Bilateral adrenal glands demonstrate no acute abnormality. KIDNEYS, URETERS AND BLADDER: Bilateral renal cortical scarring is noted. Bilateral renal cysts are noted. No stones in the kidneys or ureters. No hydronephrosis. No perinephric or periureteral stranding. Urinary bladder is unremarkable. GI AND BOWEL: Stomach and duodenal sweep demonstrate no acute abnormality. There is no bowel obstruction. No abnormal bowel wall thickening or distension. REPRODUCTIVE: Reproductive organs are unremarkable. PERITONEUM AND RETRPERITONEUM: No ascites or free air. LUNG BASE: No acute abnormality. LYMPH NODES: No lymphadenopathy. BONES AND SOFT TISSUES: No acute abnormality of the bones. No acute soft tissue abnormality. IMPRESSION: 1. No acute findings. Electronically signed by: Lynwood Seip MD 03/20/2024 12:14 PM EST RP Workstation: [id number]   ECHOCARDIOGRAM COMPLETE Result Date: 03/19/2024    ECHOCARDIOGRAM REPORT   Patient Name:   CHELLA CHAPDELAINE Date of Exam: 03/19/2024 Medical Rec #:  [id number]     Height:       63.0 in Accession #:    [id number]    Weight:       178.0 lb Date of Birth:  Jul 07, 1950    BSA:          1.840 m Patient Age:    73 years      BP:           132/90 mmHg Patient Gender: F             HR:           95 bpm. Exam Location:  Inpatient Procedure: 2D Echo (Both Spectral and Color Flow Doppler were utilized during            procedure). STAT ECHO Indications:    acute diastolic chf  History:        Patient has prior history of Echocardiogram examinations, most                 recent  01/21/2024. Chronic kidney disease, Arrythmias:Atrial                 Fibrillation; Risk Factors:Hypertension, Diabetes and                 Dyslipidemia.  Sonographer:    Tinnie Barefoot RDCS Referring Phys: [id number] JEFFREY C HATCHER IMPRESSIONS  1. Left ventricular ejection fraction, by estimation, is 50 to 55%. The left ventricle has low normal function. The left ventricle has no regional wall motion abnormalities. Left ventricular diastolic function could not be evaluated.  2. Right ventricular systolic function is mildly reduced. The right ventricular size  is normal. There is severely elevated pulmonary artery systolic pressure.  3. Left atrial size was moderately dilated.  4. The mitral valve is degenerative. Mild mitral valve regurgitation. No evidence of mitral stenosis.  5. The aortic valve is normal in structure. Aortic valve regurgitation is not visualized. No aortic stenosis is present.  6. The inferior vena cava is normal in size with greater than 50% respiratory variability, suggesting right atrial pressure of 3 mmHg. FINDINGS  Left Ventricle: Left ventricular ejection fraction, by estimation, is 50 to 55%. The left ventricle has low normal function. The left ventricle has no regional wall motion abnormalities. The left ventricular internal cavity size was normal in size. There is no left ventricular hypertrophy. Left ventricular diastolic function could not be evaluated. Right Ventricle: The right ventricular size is normal. No increase in right ventricular wall thickness. Right ventricular systolic function is mildly reduced. There is severely elevated pulmonary artery systolic pressure. The tricuspid regurgitant velocity is 3.49 m/s, and with an assumed right atrial pressure of 15 mmHg, the estimated right ventricular systolic pressure is 63.7 mmHg. Left Atrium: Left atrial size was moderately dilated. Right Atrium: Right atrial size was normal in size. Pericardium: There is no evidence of pericardial  effusion. Mitral Valve: The mitral valve is degenerative in appearance. Mild mitral annular calcification. Mild mitral valve regurgitation. No evidence of mitral valve stenosis. Tricuspid Valve: The tricuspid valve is normal in structure. Tricuspid valve regurgitation is mild . No evidence of tricuspid stenosis. Aortic Valve: The aortic valve is normal in structure. Aortic valve regurgitation is not visualized. No aortic stenosis is present. Pulmonic Valve: The pulmonic valve was normal in structure. Pulmonic valve regurgitation is mild. No evidence of pulmonic stenosis. Aorta: The aortic root is normal in size and structure. Venous: The inferior vena cava is normal in size with greater than 50% respiratory variability, suggesting right atrial pressure of 3 mmHg. IAS/Shunts: The atrial septum is grossly normal.  LEFT VENTRICLE PLAX 2D LVIDd:         4.90 cm LVIDs:         3.30 cm LV PW:         1.00 cm LV IVS:        0.90 cm LVOT diam:     2.10 cm LV SV:         42 LV SV Index:   23 LVOT Area:     3.46 cm  LEFT ATRIUM           Index        RIGHT ATRIUM           Index LA diam:      4.00 cm 2.17 cm/m   RA Area:     13.30 cm LA Vol (A4C): 61.7 ml 33.53 ml/m  RA Volume:   30.00 ml  16.30 ml/m  AORTIC VALVE LVOT Vmax:   62.60 cm/s LVOT Vmean:  40.900 cm/s LVOT VTI:    0.120 m  AORTA Ao Root diam: 2.70 cm Ao Asc diam:  3.10 cm TRICUSPID VALVE TR Peak grad:   48.7 mmHg TR Vmax:        349.00 cm/s  SHUNTS Systemic VTI:  0.12 m Systemic Diam: 2.10 cm Morene Brownie Electronically signed by Morene Brownie Signature Date/Time: 03/19/2024/6:12:10 PM    Final    US  EKG SITE RITE Result Date: 03/19/2024 If Site Rite image not attached, placement could not be confirmed due to current cardiac rhythm.  DG CHEST PORT 1  VIEW Result Date: 03/18/2024 EXAM: 1 VIEW(S) XRAY OF THE CHEST 03/18/2024 11:28:44 PM COMPARISON: 02/18/2024 CLINICAL HISTORY: Chest pain. FINDINGS: LUNGS AND PLEURA: Mild eventration of the  hemidiaphragm. No focal pulmonary opacity. No pulmonary edema. No pleural effusion. No pneumothorax. HEART AND MEDIASTINUM: No acute abnormality of the cardiac and mediastinal silhouettes. BONES AND SOFT TISSUES: Postsurgical changes involving the right shoulder. No acute osseous abnormality. IMPRESSION: 1. No acute cardiopulmonary findings. Electronically signed by: Pinkie Pebbles MD 03/18/2024 11:34 PM EST RP Workstation: [id number]   CT ABDOMEN PELVIS W CONTRAST Result Date: 03/18/2024 CLINICAL DATA:  Acute abdominal pain EXAM: CT ABDOMEN AND PELVIS WITH CONTRAST TECHNIQUE: Multidetector CT imaging of the abdomen and pelvis was performed using the standard protocol following bolus administration of intravenous contrast. RADIATION DOSE REDUCTION: This exam was performed according to the departmental dose-optimization program which includes automated exposure control, adjustment of the mA and/or kV according to patient size and/or use of iterative reconstruction technique. CONTRAST:  75mL OMNIPAQUE  IOHEXOL  350 MG/ML SOLN COMPARISON:  02/18/2024 FINDINGS: Lower chest: No acute pleural or parenchymal lung disease. Hepatobiliary: No focal liver abnormality is seen. Status post cholecystectomy. No biliary dilatation. Pancreas: Unremarkable. No pancreatic ductal dilatation or surrounding inflammatory changes. Spleen: Normal in size without focal abnormality. Adrenals/Urinary Tract: Heterogeneous areas of decreased cortical enhancement are seen within the dorsal aspect of the left kidney, which could reflect acute pyelonephritis. Please correlate with urinalysis. There are other scattered areas of cortical scarring within the bilateral kidneys. Small simple appearing renal cortical cysts do not require specific imaging follow-up. The adrenals and bladder are unremarkable. Stomach/Bowel: No bowel obstruction or ileus. No bowel wall thickening or inflammatory change. Small hiatal hernia. Vascular/Lymphatic: Aortic  atherosclerosis. No enlarged abdominal or pelvic lymph nodes. Reproductive: Uterus and bilateral adnexa are unremarkable. Other: No free fluid or free intraperitoneal gas. No abdominal wall hernia. Musculoskeletal: No acute or destructive bony abnormalities. Severe spondylosis at the L3-4 level. Reconstructed images demonstrate no additional findings. IMPRESSION: 1. Heterogeneous areas of decreased cortical enhancement within the left kidney, suspicious for pyelonephritis. Please correlate with urinalysis. 2. Otherwise no acute intra-abdominal or intrapelvic process. 3. Small hiatal hernia. 4.  Aortic Atherosclerosis (ICD10-I70.0). Electronically Signed   By: Ozell Daring M.D.   On: 03/18/2024 18:58   CT Head Wo Contrast Result Date: 03/18/2024 CLINICAL DATA:  Altered level of consciousness EXAM: CT HEAD WITHOUT CONTRAST TECHNIQUE: Contiguous axial images were obtained from the base of the skull through the vertex without intravenous contrast. RADIATION DOSE REDUCTION: This exam was performed according to the departmental dose-optimization program which includes automated exposure control, adjustment of the mA and/or kV according to patient size and/or use of iterative reconstruction technique. COMPARISON:  03/18/2024 at 4 p.m. FINDINGS: Brain: Stable chronic right PCA territory infarct with developing encephalomalacia. No evidence of acute infarct or hemorrhage. Lateral ventricles and midline structures are stable. No acute extra-axial fluid collections. No mass effect. Vascular: No hyperdense vessel or unexpected calcification. Skull: Normal. Negative for fracture or focal lesion. Sinuses/Orbits: No acute finding. Other: None. IMPRESSION: 1. Stable sequela of chronic right PCA territory infarct. 2. No acute intracranial process. Electronically Signed   By: Ozell Daring M.D.   On: 03/18/2024 18:54   CT Head Wo Contrast Result Date: 03/18/2024 CLINICAL DATA:  Altered level of consciousness, headache  EXAM: CT HEAD WITHOUT CONTRAST TECHNIQUE: Contiguous axial images were obtained from the base of the skull through the vertex without intravenous contrast. RADIATION DOSE REDUCTION: This exam was performed according to  the departmental dose-optimization program which includes automated exposure control, adjustment of the mA and/or kV according to patient size and/or use of iterative reconstruction technique. COMPARISON:  02/18/2024 FINDINGS: Examination is significantly limited by patient motion throughout the exam, despite multiple repeat imaging attempts. Brain: Continued evolution of the prior right PCA territory infarct. No evidence of hemorrhagic transformation. Stable chronic small-vessel ischemic changes within the right thalamus and periventricular white matter. No evidence of acute infarct or hemorrhage. Lateral ventricles and remaining midline structures are stable. No acute extra-axial fluid collections. No mass effect. Vascular: No hyperdense vessel or unexpected calcification. Skull: Normal. Negative for fracture or focal lesion. Sinuses/Orbits: No acute finding. Other: None. IMPRESSION: 1. Limited study due to patient motion throughout the exam. 2. Continued evolution of the chronic right PCA territory infarct noted previously. No evidence of hemorrhagic transformation. 3. Otherwise no acute intracranial process. Electronically Signed   By: Ozell Daring M.D.   On: 03/18/2024 16:14     PHYSICAL EXAM  Temp:  [97.7 F (36.5 C)-97.8 F (36.6 C)] 97.8 F (36.6 C) (11/15 1500) Pulse Rate:  [85-94] 85 (11/15 0410) Resp:  [18-20] 20 (11/15 1500) BP: (128-157)/(91-115) 157/98 (11/15 1500) SpO2:  [97 %] 97 % (11/15 1500) Weight:  [82 kg] 82 kg (11/15 0500)  General - Well nourished, well developed, in no apparent distress, mild agitation.  Ophthalmologic - fundi not visualized due to noncooperation.  Cardiovascular - irregularly irregular heart rate and rhythm  Neuro - awake, alert, eyes  open, however, flat affect, not corporative exam.   Orientated to age but refused to answer other orientation questions.  Not further language although, not cooperative with following commands.  Cooperative with name or repeat.  Right gaze gaze preference, tracking to the left but incomplete, not blinking to visual threat on the left but able to blink to visual threat on the right. No facial droop. Tongue protrusion not corporative. Bilateral UEs not raising up as requested. Bilaterally LEs withdraw to pain seems symmetrical. Sensation, coordination not corporative and gait not tested.   ASSESSMENT/PLAN Ms. HAIDYN KILBURG is a 73 y.o. 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 No TNK given due to outside window and on Eliquis .    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  Subacute infarct right PCA territory. 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 Marked decreased caliber opacification throughout the right MCA territory, suggesting low perfusion. Moderate focal stenosis of the distal right P2 segment with asymmetric attenuation of distal right PCA branches. 2D Echo EF 50 to 55%, RV function is mildly reduced Carotid Doppler pending LDL 87 HgbA1c pending Heparin IV for VTE prophylaxis Eliquis  (apixaban ) daily prior to admission, now on heparin IV per stroke protocol Ongoing aggressive stroke risk factor management Therapy recommendations:  CIR Disposition: Pending  History of stroke 01/2024 admitted for right large PCA infarct.  CT head and neck showed right ICA origin occlusion with distal reconstitution.  Right P2 occlusion.  CTP 9/42.  EF 60 to 65%.  LDL 123, A1c 6.5, UDS negative.   Discharged on Eliquis , aspirin , Crestor  and Zetia  10.  PE CTA chest 11/13 showed right lower lobe submassive PE Was on heparin IV LE venous Doppler pending Pan CT no malignancy MRI  tibia did not show recurrent malignancy of left tibial GCT No TCD bubble study needed as current stroke not likely from paradoxical emboli  Abnormal EEG Long-term EEG showed sharp waves at right hemisphere Likely due to underlying stroke Continue long-term EEG On Keppra 500 twice daily  A-fib Was on Eliquis  PTA Now heparin IV per stroke protocol May consider switch to DOAC at discharge  Diabetes DKA HgbA1c pending goal < 7.0 Currently on insulin  drip CBG monitoring SSI DM education and close PCP follow up  Hypertension BP fluctuate Avoid low BP given chronic right ICA occlusion Long term BP goal normotensive  Hyperlipidemia Home meds: Crestor  20 and Zetia  10 LDL 87, goal < 70 Now on home Crestor  20 and Zetia  10 Continue statin at discharge  Dysphagia Did not pass swallow Status post core Trak On tube feeding  Other Stroke Risk Factors Advanced age Obesity, Body mass index is 32.02 kg/m.   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 I spent extensive total face-to-face time with the patient and family, reviewing test results, images and medication, and discussing the diagnosis, treatment plan and potential prognosis. This patient's care requiresreview of multiple databases, neurological assessment, discussion with family, other specialists and medical decision making of high complexity.  Ary Cummins, MD PhD Stroke Neurology 03/22/2024 7:21 PM    To contact Stroke Continuity provider, please refer to [email or web address]. After hours, contact General Neurology

## 2024-03-22 NOTE — Progress Notes (Incomplete)
 Pt has MRI ordered. Per tech, they are unable to complete study due to frequent movements. Pt baseline lethargic, and does not fallow commands. Azadegan, MD notified.

## 2024-03-22 NOTE — Evaluation (Addendum)
 Occupational Therapy Evaluation Patient Details Name: Megan Fischer MRN: [id number] DOB: May 07, 1951 Today's Date: 03/22/2024   History of Present Illness   73 y/o F presenting to ED on 11/11 with worsening headache, restlessness, confusion x3 days. CTA significant for subsegmental PE. MRI head with R MCA CVA and petechial hemorrhage in territory of recent R PCA CVA.    PMH includes HTN, DM2, A fib on apibaxan, CKD IV, R PCA CVA with residual L visual loss, weakness, neglect, multinodular goider     Clinical Impressions Pt questionable historian, reports living with her son in a first floor apartment, son is caregiver for her and assists with ADLs, reports using a cane for mobility. Pt currently needs up to max A for ADLs, mod A +2 for bed mobility. Pt with incontinent BM upon arrival. Needs mod A +2 for safety due to pt pulling at tubes/lines during session. Pt with L inattention, weakness, L hemianopsia from prior CVA, but now able to cross mildine and track to L. Pt presenting with impairments listed below, will follow acutely. Patient will benefit from intensive inpatient follow-up therapy, >3 hours/day to maximize safety/ind with ADL/functional mobility.      If plan is discharge home, recommend the following:   Two people to help with walking and/or transfers;Two people to help with bathing/dressing/bathroom;Assistance with cooking/housework;Direct supervision/assist for medications management;Direct supervision/assist for financial management;Assist for transportation;Help with stairs or ramp for entrance     Functional Status Assessment   Patient has had a recent decline in their functional status and demonstrates the ability to make significant improvements in function in a reasonable and predictable amount of time.     Equipment Recommendations   Other (comment) (defer)     Recommendations for Other Services   Rehab consult;Speech consult      Precautions/Restrictions   Precautions Precautions: Fall;Other (comment) Recall of Precautions/Restrictions: Impaired Precaution/Restrictions Comments: L homonymous hemianopsia, bil mitts, LTM EEG, cortrak Restrictions Weight Bearing Restrictions Per Provider Order: No     Mobility Bed Mobility Overal bed mobility: Needs Assistance Bed Mobility: Rolling Rolling: Mod assist, +2 for physical assistance         General bed mobility comments: pt fluctuating needing min A - max A to roll at bed level, assist to bring hips and hold hands in sidelying as pt attmpting to pull at lines and roll to back    Transfers                   General transfer comment: deferred      Balance                                           ADL either performed or assessed with clinical judgement   ADL Overall ADL's : Needs assistance/impaired Eating/Feeding: NPO   Grooming: Moderate assistance   Upper Body Bathing: Moderate assistance   Lower Body Bathing: Maximal assistance   Upper Body Dressing : Moderate assistance   Lower Body Dressing: Maximal assistance   Toilet Transfer: Moderate assistance           Functional mobility during ADLs: Moderate assistance       Vision   Visual Fields: Left homonymous hemianopsia Additional Comments: needs further assessment, pt crossing midline, hx of L homonymous hemianopsia from prior CVA     Perception Perception: Impaired Preception Impairment Details: Inattention/Neglect  Praxis         Pertinent Vitals/Pain Pain Assessment Pain Assessment: No/denies pain     Extremity/Trunk Assessment Upper Extremity Assessment Upper Extremity Assessment: Defer to OT evaluation RUE Deficits / Details: overall functionally, 3+/5 globally LUE Deficits / Details: overall functional, 3/5 globally   Lower Extremity Assessment Lower Extremity Assessment: RLE deficits/detail;LLE deficits/detail RLE Deficits /  Details: Moves spontaneously, at least 3/5 LLE Deficits / Details: Appears slightly weaker than R, grossly 2-/5   Cervical / Trunk Assessment Cervical / Trunk Assessment: Kyphotic   Communication Communication Communication: Impaired Factors Affecting Communication: Difficulty expressing self (delayed)   Cognition Arousal: Alert Behavior During Therapy: Restless Cognition: Cognition impaired   Orientation impairments: Situation, Time Awareness: Intellectual awareness intact, Online awareness impaired   Attention impairment (select first level of impairment): Selective attention Executive functioning impairment (select all impairments): Organization, Sequencing, Reasoning, Problem solving, Initiation                   Following commands: Impaired Following commands impaired: Follows one step commands inconsistently     Cueing  General Comments   Cueing Techniques: Verbal cues;Tactile cues  VSS on RA   Exercises     Shoulder Instructions      Home Living Family/patient expects to be discharged to:: Private residence Living Arrangements: Children (son is 24/7 caregiver per pt and RN) Available Help at Discharge: Family;Available 24 hours/day Type of Home: Apartment Home Access: Level entry     Home Layout: One level     Bathroom Shower/Tub: Chief Strategy Officer: Standard Bathroom Accessibility: Yes   Home Equipment: Grab bars - tub/shower;Cane - single point;Toilet riser   Additional Comments: info taken from prior admission      Prior Functioning/Environment Prior Level of Function : Independent/Modified Independent             Mobility Comments: cane PRN ADLs Comments: son assists with ADLs    OT Problem List: Decreased strength;Decreased range of motion;Decreased activity tolerance;Impaired balance (sitting and/or standing);Impaired vision/perception;Decreased coordination;Decreased cognition;Decreased safety awareness;Decreased  knowledge of use of DME or AE;Decreased knowledge of precautions;Impaired sensation;Pain;Impaired UE functional use;Obesity   OT Treatment/Interventions: Self-care/ADL training;Therapeutic exercise;DME and/or AE instruction;Therapeutic activities;Cognitive remediation/compensation;Patient/family education;Balance training;Visual/perceptual remediation/compensation;Neuromuscular education      OT Goals(Current goals can be found in the care plan section)   Acute Rehab OT Goals Patient Stated Goal: unable to state this session OT Goal Formulation: With patient Time For Goal Achievement: 04/05/24 Potential to Achieve Goals: Good ADL Goals Pt Will Perform Upper Body Dressing: with min assist;sitting Pt Will Perform Lower Body Dressing: with mod assist;sitting/lateral leans;sit to/from stand Pt Will Transfer to Toilet: with min assist;ambulating;regular height toilet Pt/caregiver will Perform Home Exercise Program: Increased ROM;Increased strength;Both right and left upper extremity;With written HEP provided;With minimal assist Additional ADL Goal #1: Pt will perform bed mobility min A in prep for seated ADLs   OT Frequency:  Min 2X/week    Co-evaluation PT/OT/SLP Co-Evaluation/Treatment: Yes Reason for Co-Treatment: Complexity of the patient's impairments (multi-system involvement);For patient/therapist safety;To address functional/ADL transfers PT goals addressed during session: Mobility/safety with mobility OT goals addressed during session: ADL's and self-care      AM-PAC OT 6 Clicks Daily Activity     Outcome Measure Help from another person eating meals?: A Little Help from another person taking care of personal grooming?: A Lot Help from another person toileting, which includes using toliet, bedpan, or urinal?: Total Help from another person bathing (including washing, rinsing,  drying)?: A Lot Help from another person to put on and taking off regular upper body clothing?: A  Lot Help from another person to put on and taking off regular lower body clothing?: Total 6 Click Score: 11   End of Session Nurse Communication: Mobility status (resume tube feeds)  Activity Tolerance: Patient tolerated treatment well Patient left: in bed;with call bell/phone within reach;with bed alarm set;with nursing/sitter in room  OT Visit Diagnosis: Other abnormalities of gait and mobility (R26.89);Muscle weakness (generalized) (M62.81);Hemiplegia and hemiparesis;Other symptoms and signs involving cognitive function;Low vision, both eyes (H54.2);Unsteadiness on feet (R26.81) Hemiplegia - Right/Left: Left Hemiplegia - dominant/non-dominant: Non-Dominant Hemiplegia - caused by: Cerebral infarction                Time: 8695-8673 OT Time Calculation (min): 22 min Charges:  OT General Charges $OT Visit: 1 Visit OT Evaluation $OT Eval Moderate Complexity: 1 Mod  [place] Apgar K, OTD, OTR/L SecureChat Preferred Acute Rehab [phone]   Alira Fretwell K Koonce 03/22/2024, 2:32 PM

## 2024-03-22 NOTE — Progress Notes (Signed)
 PHARMACY - ANTICOAGULATION  Pharmacy Consult for heparin  Indication: submassive PE  Brief A/P: aPTT supratherapeutic Decrease Heparin rate   Allergies  Allergen Reactions   Aspirin  Nausea Only    Patient stated to neuro that it caused an upset stomach   Codeine Other (See Comments)    Unknown    Fentanyl  Other (See Comments)    hypoxia   Oxycodone Hcl Other (See Comments)    Unknown    Penicillins Other (See Comments)    Unknown    Chlorhexidine Gluconate Rash    Patient Measurements: Height: 5' 3 (160 cm) Weight: 82 kg (180 lb 12.4 oz) IBW/kg (Calculated) : 52.4 HEPARIN DW (KG): 70.1  Vital Signs: Temp: 97.5 F (36.4 C) (11/15 2007) Temp Source: Oral (11/15 2007) BP: 144/77 (11/15 2007) Pulse Rate: 80 (11/15 2007)  Labs: Recent Labs    03/20/24 0446 03/20/24 0951 03/21/24 0500 03/21/24 0900 03/22/24 0527 03/22/24 2320  HGB 15.3*  --  15.5*  --  14.1  --   HCT 45.1  --  45.9  --  42.0  --   PLT 225  --  254  --  200  --   APTT  --   --  159*  --   --  136*  HEPARINUNFRC  --   --  >1.10*  --   --  0.96*  CREATININE 1.08*   < > 1.32* 1.66* 2.32*  --   CKTOTAL  --   --  148  --   --   --    < > = values in this interval not displayed.    Estimated Creatinine Clearance: 21.9 mL/min (A) (by C-G formula based on SCr of 2.32 mg/dL (H)).  Assessment: 73 y.o. female admitted with CVA, found to have RLL submassive PE, for heparin    Goal of Therapy:  Heparin level 0.3-0.5 units/ml aPTT 66-85 seconds Monitor platelets by anticoagulation protocol: Yes   Plan:  Hold heparin x 1 hour, then decrease heparin 750 units/hr Check aPTT in 8 hours   Cathlyn Arrant, PharmD, BCPS  03/22/2024 11:56 PM

## 2024-03-22 NOTE — Progress Notes (Signed)
 LTM EEG hooked up and running - no initial skin breakdown - push button tested - [facility] monitoring.

## 2024-03-22 NOTE — Progress Notes (Signed)
 Inpatient Rehab Admissions Coordinator Note:   Per therapy patient was screened for CIR candidacy by Derric Dealmeida SHAUNNA Yvone Cohens, CCC-SLP. At this time, pt appears to be a potential candidate for CIR. If pt would like to be considered, please place an IP Rehab Md order.    Tinnie Yvone Cohens, MS, CCC-SLP Admissions Coordinator [phone] 03/22/24 4:36 PM

## 2024-03-22 NOTE — Progress Notes (Signed)
 OT Cancellation Note  Patient Details Name: Megan Fischer MRN: [id number] DOB: 1950-10-06   Cancelled Treatment:    Reason Eval/Treat Not Completed: Patient at procedure or test/ unavailable (being hooked up to EEG. will follow up for OT eval as schedule permits)   Nereida Schepp K, OTD, OTR/L SecureChat Preferred Acute Rehab [phone]   Megan Fischer 03/22/2024, 9:31 AM

## 2024-03-23 ENCOUNTER — Inpatient Hospital Stay (HOSPITAL_COMMUNITY)

## 2024-03-23 DIAGNOSIS — I2699 Other pulmonary embolism without acute cor pulmonale: Secondary | ICD-10-CM

## 2024-03-23 DIAGNOSIS — R569 Unspecified convulsions: Secondary | ICD-10-CM

## 2024-03-23 DIAGNOSIS — I959 Hypotension, unspecified: Secondary | ICD-10-CM

## 2024-03-23 DIAGNOSIS — R9401 Abnormal electroencephalogram [EEG]: Secondary | ICD-10-CM | POA: Insufficient documentation

## 2024-03-23 DIAGNOSIS — I63511 Cerebral infarction due to unspecified occlusion or stenosis of right middle cerebral artery: Secondary | ICD-10-CM

## 2024-03-23 DIAGNOSIS — Z7189 Other specified counseling: Secondary | ICD-10-CM | POA: Diagnosis not present

## 2024-03-23 DIAGNOSIS — I63231 Cerebral infarction due to unspecified occlusion or stenosis of right carotid arteries: Secondary | ICD-10-CM | POA: Diagnosis not present

## 2024-03-23 DIAGNOSIS — E869 Volume depletion, unspecified: Secondary | ICD-10-CM

## 2024-03-23 DIAGNOSIS — N179 Acute kidney failure, unspecified: Secondary | ICD-10-CM | POA: Diagnosis not present

## 2024-03-23 DIAGNOSIS — Z515 Encounter for palliative care: Secondary | ICD-10-CM | POA: Diagnosis not present

## 2024-03-23 DIAGNOSIS — E1110 Type 2 diabetes mellitus with ketoacidosis without coma: Secondary | ICD-10-CM | POA: Diagnosis not present

## 2024-03-23 LAB — CBC
HCT: 39.7 % (ref 36.0–46.0)
Hemoglobin: 13.6 g/dL (ref 12.0–15.0)
MCH: 28.8 pg (ref 26.0–34.0)
MCHC: 34.3 g/dL (ref 30.0–36.0)
MCV: 83.9 fL (ref 80.0–100.0)
Platelets: 216 K/uL (ref 150–400)
RBC: 4.73 MIL/uL (ref 3.87–5.11)
RDW: 14.6 % (ref 11.5–15.5)
WBC: 8.1 K/uL (ref 4.0–10.5)
nRBC: 0 % (ref 0.0–0.2)

## 2024-03-23 LAB — CULTURE, BLOOD (ROUTINE X 2)
Culture: NO GROWTH
Culture: NO GROWTH

## 2024-03-23 LAB — BASIC METABOLIC PANEL WITH GFR
Anion gap: 10 (ref 5–15)
BUN: 38 mg/dL — ABNORMAL HIGH (ref 8–23)
CO2: 23 mmol/L (ref 22–32)
Calcium: 8.4 mg/dL — ABNORMAL LOW (ref 8.9–10.3)
Chloride: 98 mmol/L (ref 98–111)
Creatinine, Ser: 2.2 mg/dL — ABNORMAL HIGH (ref 0.44–1.00)
GFR, Estimated: 23 mL/min — ABNORMAL LOW (ref >60)
Glucose, Bld: 161 mg/dL — ABNORMAL HIGH (ref 70–99)
Potassium: 3.5 mmol/L (ref 3.5–5.1)
Sodium: 131 mmol/L — ABNORMAL LOW (ref 135–145)

## 2024-03-23 LAB — GLUCOSE, CAPILLARY
Glucose-Capillary: 117 mg/dL — ABNORMAL HIGH (ref 70–99)
Glucose-Capillary: 139 mg/dL — ABNORMAL HIGH (ref 70–99)
Glucose-Capillary: 142 mg/dL — ABNORMAL HIGH (ref 70–99)
Glucose-Capillary: 217 mg/dL — ABNORMAL HIGH (ref 70–99)
Glucose-Capillary: 217 mg/dL — ABNORMAL HIGH (ref 70–99)

## 2024-03-23 LAB — MAGNESIUM: Magnesium: 2.2 mg/dL (ref 1.7–2.4)

## 2024-03-23 LAB — APTT
aPTT: 22 s — ABNORMAL LOW (ref 24–36)
aPTT: 67 s — ABNORMAL HIGH (ref 24–36)

## 2024-03-23 LAB — PHOSPHORUS: Phosphorus: 3.4 mg/dL (ref 2.5–4.6)

## 2024-03-23 LAB — HEMOGLOBIN A1C
Hgb A1c MFr Bld: 7.7 % — ABNORMAL HIGH (ref 4.8–5.6)
Mean Plasma Glucose: 174.29 mg/dL

## 2024-03-23 MED ORDER — HEPARIN (PORCINE) 25000 UT/250ML-% IV SOLN
750.0000 [IU]/h | INTRAVENOUS | Status: DC
  Administered 2024-03-25: 750 [IU]/h via INTRAVENOUS
  Filled 2024-03-23 (×3): qty 250

## 2024-03-23 MED ORDER — POTASSIUM CHLORIDE 20 MEQ PO PACK
40.0000 meq | PACK | Freq: Once | ORAL | Status: AC
  Administered 2024-03-23: 40 meq
  Filled 2024-03-23: qty 2

## 2024-03-23 MED ORDER — ATORVASTATIN CALCIUM 40 MG PO TABS
40.0000 mg | ORAL_TABLET | Freq: Every day | ORAL | Status: DC
  Administered 2024-03-23 – 2024-03-25 (×3): 40 mg
  Filled 2024-03-23 (×3): qty 1

## 2024-03-23 MED ORDER — HYDROXYZINE HCL 10 MG PO TABS
10.0000 mg | ORAL_TABLET | Freq: Three times a day (TID) | ORAL | Status: DC | PRN
  Administered 2024-03-25 – 2024-03-29 (×2): 10 mg via ORAL
  Filled 2024-03-23 (×2): qty 1

## 2024-03-23 MED ORDER — HYDROXYZINE HCL 10 MG PO TABS
10.0000 mg | ORAL_TABLET | Freq: Once | ORAL | Status: AC
  Administered 2024-03-23: 10 mg via ORAL
  Filled 2024-03-23: qty 1

## 2024-03-23 MED ORDER — LACTATED RINGERS IV BOLUS
1000.0000 mL | Freq: Once | INTRAVENOUS | Status: AC
  Administered 2024-03-23: 1000 mL via INTRAVENOUS

## 2024-03-23 NOTE — Plan of Care (Signed)
  Problem: Education: Goal: Knowledge of General Education information will improve Description: Including pain rating scale, medication(s)/side effects and non-pharmacologic comfort measures Outcome: Progressing   Problem: Health Behavior/Discharge Planning: Goal: Ability to manage health-related needs will improve Outcome: Progressing   Problem: Clinical Measurements: Goal: Ability to maintain clinical measurements within normal limits will improve Outcome: Progressing Goal: Will remain free from infection Outcome: Progressing Goal: Diagnostic test results will improve Outcome: Progressing Goal: Respiratory complications will improve Outcome: Progressing Goal: Cardiovascular complication will be avoided Outcome: Progressing   Problem: Activity: Goal: Risk for activity intolerance will decrease Outcome: Progressing   Problem: Nutrition: Goal: Adequate nutrition will be maintained Outcome: Progressing   Problem: Coping: Goal: Level of anxiety will decrease Outcome: Progressing   Problem: Elimination: Goal: Will not experience complications related to bowel motility Outcome: Progressing Goal: Will not experience complications related to urinary retention Outcome: Progressing   Problem: Pain Managment: Goal: General experience of comfort will improve and/or be controlled Outcome: Progressing   Problem: Safety: Goal: Ability to remain free from injury will improve Outcome: Progressing   Problem: Skin Integrity: Goal: Risk for impaired skin integrity will decrease Outcome: Progressing   Problem: Education: Goal: Ability to describe self-care measures that may prevent or decrease complications (Diabetes Survival Skills Education) will improve Outcome: Progressing Goal: Individualized Educational Video(s) Outcome: Progressing   Problem: Coping: Goal: Ability to adjust to condition or change in health will improve Outcome: Progressing   Problem: Fluid  Volume: Goal: Ability to maintain a balanced intake and output will improve Outcome: Progressing   Problem: Health Behavior/Discharge Planning: Goal: Ability to identify and utilize available resources and services will improve Outcome: Progressing Goal: Ability to manage health-related needs will improve Outcome: Progressing   Problem: Metabolic: Goal: Ability to maintain appropriate glucose levels will improve Outcome: Progressing   Problem: Nutritional: Goal: Maintenance of adequate nutrition will improve Outcome: Progressing Goal: Progress toward achieving an optimal weight will improve Outcome: Progressing   Problem: Skin Integrity: Goal: Risk for impaired skin integrity will decrease Outcome: Progressing   Problem: Tissue Perfusion: Goal: Adequacy of tissue perfusion will improve Outcome: Progressing   Problem: Education: Goal: Knowledge of disease or condition will improve Outcome: Progressing Goal: Knowledge of secondary prevention will improve (MUST DOCUMENT ALL) Outcome: Progressing Goal: Knowledge of patient specific risk factors will improve (DELETE if not current risk factor) Outcome: Progressing   Problem: Ischemic Stroke/TIA Tissue Perfusion: Goal: Complications of ischemic stroke/TIA will be minimized Outcome: Progressing   Problem: Coping: Goal: Will verbalize positive feelings about self Outcome: Progressing Goal: Will identify appropriate support needs Outcome: Progressing   Problem: Health Behavior/Discharge Planning: Goal: Ability to manage health-related needs will improve Outcome: Progressing Goal: Goals will be collaboratively established with patient/family Outcome: Progressing   Problem: Self-Care: Goal: Ability to participate in self-care as condition permits will improve Outcome: Progressing Goal: Verbalization of feelings and concerns over difficulty with self-care will improve Outcome: Progressing Goal: Ability to communicate  needs accurately will improve Outcome: Progressing   Problem: Nutrition: Goal: Risk of aspiration will decrease Outcome: Progressing Goal: Dietary intake will improve Outcome: Progressing

## 2024-03-23 NOTE — Progress Notes (Signed)
 Daily Progress Note   Patient Name: Megan Fischer       Date: 03/23/2024 DOB: 1951/01/29  Age: 73 y.o. MRN#: [id number] Attending Physician: Megan Reyes BROCKS, MD Primary Care Physician: Megan Manos, MD Admit Date: 03/18/2024  Reason for Consultation/Follow-up: Establishing goals of care   Length of Stay: 4  Current Medications: Scheduled Meds:   acetaminophen  (TYLENOL ) oral liquid 160 mg/5 mL  1,000 mg Per Tube Q6H   aspirin   81 mg Per Tube Daily   atorvastatin  40 mg Per Tube Daily   cholestyramine  4 g Per Tube QID   ezetimibe   10 mg Per Tube Daily   famotidine   20 mg Per Tube Daily   feeding supplement (PROSource TF20)  60 mL Per Tube Daily   insulin  aspart  0-15 Units Subcutaneous TID WC   insulin  glargine-yfgn  10 Units Subcutaneous QHS   levETIRAcetam  500 mg Per Tube BID   methimazole   10 mg Per Tube Daily   metoprolol  tartrate  25 mg Per Tube BID   senna  2 tablet Per Tube Daily   sodium chloride  flush  10-40 mL Intracatheter Q12H   thiamine  100 mg Per Tube Daily    Continuous Infusions:  feeding supplement (OSMOLITE 1.2 CAL) 40 mL/hr at 03/23/24 0700   heparin 750 Units/hr (03/23/24 1000)    PRN Meds: LORazepam , ondansetron (ZOFRAN) IV, polyethylene glycol, sodium chloride  flush  Physical Exam Vitals reviewed.  Constitutional:      General: She is sleeping. She is not in acute distress.    Comments: Mitts and cortrak  HENT:     Head: Normocephalic and atraumatic.  Cardiovascular:     Rate and Rhythm: Normal rate.  Pulmonary:     Effort: Pulmonary effort is normal.  Skin:    General: Skin is warm and dry.  Neurological:     Mental Status: She is easily aroused.     Comments: Oriented to person             Vital Signs: BP (!) 148/133 (BP  Location: Right Leg)   Pulse 65   Temp 98.2 F (36.8 C) (Axillary)   Resp 17   Ht 5' 3 (1.6 m)   Wt 81.4 kg Comment: 1 blanket 1 pillow in bed at time of weight  SpO2  92%   BMI 31.79 kg/m  SpO2: SpO2: 92 % O2 Device: O2 Device: Room Air O2 Flow Rate:     Patient Active Problem List   Diagnosis Date Noted   Abnormal EEG 03/23/2024   H/O ischemic right PCA stroke 03/22/2024   Pulmonary embolism (HCC) 03/21/2024   Hypercoagulable state 03/21/2024   AKI (acute kidney injury) 03/21/2024   Malnutrition of moderate degree 03/21/2024   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   Right ventricular dysfunction 03/20/2024   History of recent stroke 03/20/2024   Hyperthyroidism 03/19/2024   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   Delirium 03/19/2024   High anion gap metabolic acidosis 03/19/2024   Lactic acidosis 03/19/2024   Euglycemic DKA (diabetic ketoacidosis) (HCC) 03/19/2024   Hyperlipemia 03/18/2024   Thyroid  storm 03/18/2024   Encephalopathy 03/18/2024   Abdominal pain, epigastric 03/18/2024   Hemiplegia and hemiparesis following cerebral infarction affecting left non-dominant side (HCC) 03/18/2024   Headache 03/18/2024   Vomiting 03/18/2024   Left shoulder pain 02/18/2024   CKD (chronic kidney disease) stage 3, GFR 30-59 ml/min (HCC) 01/31/2024   Stroke (HCC) 01/21/2024   Acute ischemic right MCA stroke (HCC) 01/21/2024   Cellulitis of left finger 09/19/2023   Chest pain 09/19/2023   Leg pain 03/02/2023   Acute respiratory failure with hypoxia (HCC) 04/11/2021   Acute on chronic diastolic CHF (congestive heart failure) (HCC) 04/11/2021   Hypercholesterolemia 09/17/2018   Iron deficiency anemia 06/24/2017   Anemia of chronic disease 07/20/2016   Healthcare maintenance 06/09/2013   Multinodular goiter 01/20/2013   Chronic disease anemia 03/29/2011   Obesities, morbid (HCC)    Hypertension    Dyslipidemia    DM (diabetes mellitus) type II controlled with renal  manifestation (HCC)    Atrial fibrillation (HCC) 08/04/2010    Palliative Care Assessment & Plan   Patient Profile: 73 y.o. female  with past medical history of DM2, Afib (on eliquis ), CKD4, CVA (01/2024), and past multinodular goiter admitted on 03/18/2024 with headache and agitation.   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   Today's Discussion: Reviewed chart and received updates from bedside sitter. Patient did not sleep much last night and has slept much of today. She was pulling at her PICC line earlier and is in mitts. Patient lying in bed with eyes closed but opens them when I entered the room. She is oriented to self. She did not reply when I asked other orientation questions. No family at bedside.  Spoke to patient's son Megan Fischer by phone. He shared that the patient has always been a night owl and he is not surprised she is sleeping more during the day and staying awake at night. He plans to visit this morning. Shared that SW will be able to get more information about his status as caregiver during the week. He is grateful for the help. Plan remains to treat the treatable allowing time for outcomes.  Emotional support and therapeutic listening provided. Encouraged family to call PMT with questions or needs. PMT will continue to support.  Recommendations/Plan: DNR/DNI Treat the treatable allowing time for outcomes Spiritual care for support Continued PMT support    Code Status:    Code Status Orders  (From admission, onward)           Start     Ordered   03/19/24 0936  Do not attempt resuscitation (DNR)- Limited -Do Not Intubate (  DNI)  Continuous       Question Answer Comment  If pulseless and not breathing No CPR or chest compressions.   In Pre-Arrest Conditions (Patient Is Breathing and Has A Pulse) Do not intubate. Provide all appropriate  non-invasive medical interventions. Avoid ICU transfer unless indicated or required.   Consent: Discussion documented in EHR or advanced directives reviewed      03/19/24 0939        Extensive chart review has been completed prior to seeing the patient including labs, vital signs, imaging, progress/consult notes, orders, medications, and available advance directive documents.   Care plan was discussed with bedside sitter  Time spent: 35 minutes  Thank you for allowing the Palliative Medicine Team to assist in the care of this patient.    Stephane CHRISTELLA Palin, NP  Please contact Palliative Medicine Team phone at [phone] for questions and concerns.

## 2024-03-23 NOTE — Progress Notes (Signed)
 STROKE TEAM PROGRESS NOTE   SUBJECTIVE (INTERVAL HISTORY) Her sitter and daughter are at the bedside.  Patient awake and interactive but remains confused, delirious and with intermittent agitation, trying to get out of bed.  Speech is clear but a lot of distractibility and poor insight into her condition, not cooperative with exam.  Long-term EEG shows right posterior sharp waves and generalized slowing but no definite epileptiform activity..   OBJECTIVE Temp:  [97.5 F (36.4 C)-98.6 F (37 C)] 97.6 F (36.4 C) (11/16 1245) Pulse Rate:  [60-99] 88 (11/16 1245) Cardiac Rhythm: Atrial fibrillation (11/16 0700) Resp:  [17-20] 19 (11/16 1245) BP: (86-183)/(43-133) 129/106 (11/16 1245) SpO2:  [77 %-99 %] 92 % (11/16 1245) Weight:  [81.4 kg] 81.4 kg (11/16 0617)  Recent Labs  Lab 03/22/24 1305 03/22/24 1603 03/22/24 2038 03/23/24 0827 03/23/24 1241  GLUCAP 199* 254* 108* 217* 217*   Recent Labs  Lab 03/18/24 1446 03/18/24 1453 03/20/24 0446 03/20/24 0951 03/20/24 2200 03/21/24 0500 03/21/24 0900 03/21/24 1850 03/22/24 0527 03/23/24 0330  NA 140   < > 130*   < > 130* 131* 132*  --  128* 131*  K 3.8   < > 3.3*   < > 3.0* 4.3 4.0  --  3.7 3.5  CL 106   < > 91*   < > 93* 94* 94*  --  94* 98  CO2 20*   < > 22   < > 27 22 25   --  23 23  GLUCOSE 149*   < > 133*   < > 155* 179* 141*  --  169* 161*  BUN 18   < > 19   < > 16 17 21   --  34* 38*  CREATININE 1.15*   < > 1.08*   < > 1.12* 1.32* 1.66*  --  2.32* 2.20*  CALCIUM  9.4   < > 8.6*   < > 8.3* 8.4* 8.6*  --  8.3* 8.4*  MG 1.7  --  1.4*  --   --   --   --   --  2.2 2.2  PHOS  --   --   --   --   --   --   --  3.7 4.3 3.4   < > = values in this interval not displayed.   Recent Labs  Lab 03/19/24 0006 03/19/24 0215 03/20/24 1218 03/21/24 0500  AST 23 28 40 25  ALT 14 15 21 14   ALKPHOS 103 104 115 105  BILITOT 0.9 1.2 1.2 0.9  PROT 7.5 7.7 8.1 6.9  ALBUMIN 3.6 3.6 3.3* 2.9*   Recent Labs  Lab 03/18/24 1446  03/18/24 1453 03/19/24 1529 03/20/24 0446 03/21/24 0500 03/22/24 0527 03/23/24 0330  WBC 4.8   < > 6.6 9.8 8.5 7.3 8.1  NEUTROABS 3.1  --  5.4  --  6.8  --   --   HGB 14.0   < > 14.6 15.3* 15.5* 14.1 13.6  HCT 43.8   < > 42.9 45.1 45.9 42.0 39.7  MCV 88.3   < > 84.4 83.7 83.2 84.5 83.9  PLT 188   < > 195 225 254 200 216   < > = values in this interval not displayed.   Recent Labs  Lab 03/19/24 1529 03/21/24 0500  CKTOTAL 320* 148   No results for input(s): LABPROT, INR in the last 72 hours. No results for input(s): COLORURINE, LABSPEC, PHURINE, GLUCOSEU, HGBUR, BILIRUBINUR, KETONESUR, PROTEINUR, UROBILINOGEN, NITRITE, LEUKOCYTESUR  in the last 72 hours.  Invalid input(s): APPERANCEUR     Component Value Date/Time   CHOL 155 03/22/2024 0527   CHOL 201 (H) 03/01/2023 1151   TRIG 108 03/22/2024 0527   HDL 46 03/22/2024 0527   HDL 108 03/01/2023 1151   CHOLHDL 3.4 03/22/2024 0527   VLDL 22 03/22/2024 0527   LDLCALC 87 03/22/2024 0527   LDLCALC 83 03/01/2023 1151   Lab Results  Component Value Date   HGBA1C 7.7 (H) 03/23/2024      Component Value Date/Time   LABOPIA NONE DETECTED 03/19/2024 0006   COCAINSCRNUR NONE DETECTED 03/19/2024 0006   LABBENZ POSITIVE (A) 03/19/2024 0006   AMPHETMU NONE DETECTED 03/19/2024 0006   THCU NONE DETECTED 03/19/2024 0006   LABBARB NONE DETECTED 03/19/2024 0006    Recent Labs  Lab 03/19/24 1042  ETH <15    I have personally reviewed the radiological images below and agree with the radiology interpretations.  Overnight EEG with video Result Date: 03/23/2024 Shelton Arlin KIDD, MD     03/23/2024  8:10 AM Patient Name: MAKAIYA GEERDES MRN: [id number] Epilepsy Attending: Arlin KIDD Shelton Referring Physician/Provider: Khaliqdina, Salman, MD Duration: 03/22/2024 1116 to 03/23/2024 0800 Patient history: 73yo F with right MCA stroke. EEG to evaluate for seizure. Level of alertness: Awake/ lethargic, asleep AEDs  during EEG study: LEV Technical aspects: This EEG study was done with scalp electrodes positioned according to the 10-20 International system of electrode placement. Electrical activity was reviewed with band pass filter of 1-70Hz , sensitivity of 7 uV/mm, display speed of 1mm/sec with a 60Hz  notched filter applied as appropriate. EEG data were recorded continuously and digitally stored.  Video monitoring was available and reviewed as appropriate. Description: No clear posterior dominant rhythm was seen. Sleep was characterized by vertex waves, sleep spindles (12 to 14 Hz), maximal frontocentral region. EEG showed continuous generalized and maximal right posterior quadrant 3 to 6 Hz theta-delta slowing. Abundant sharp waves were noted in right posterior quadrant, at times qasi-period at 0.5hz . Hyperventilation and photic stimulation were not performed.   ABNORMALITY - Sharp wave, right posterior quadrant - Continuous slow, generalized and maximal right posterior quadrant IMPRESSION: This study initially showed evidence of epileptogenicity arising from right posterior quadrant. Additionally there is cortical dysfunction arising from right posterior quadrant likely secondary to underlying structural abnormality. Lastly there is generalized cerebral dysfunction (encephalopathy). No seizures were seen throughout the recording. Priyanka KIDD Shelton   CT ANGIO HEAD W OR WO CONTRAST Result Date: 03/21/2024 EXAM: CT HEAD WITHOUT CTA HEAD WITH AND WITHOUT 03/21/2024 06:20:00 PM TECHNIQUE: CT of the head was performed without the administration of intravenous contrast followed by a CTA of the head was performed with and without the administration of intravenous contrast. Multiplanar 2D and/or 3D reformatted images are provided for review. Automated exposure control, iterative reconstruction, and/or weight based adjustment of the mA/kV was utilized to reduce the radiation dose to as low as reasonably achievable. COMPARISON:  MRI head 03/21/2024 CLINICAL HISTORY: Submassive pulmonary emboli with right MCA and PCA territory infarct. FINDINGS: CT HEAD: BRAIN AND VENTRICLES: The evolving right occipital and posterior inferior temporal lobe nonhemorrhagic infarct is again noted. The acute infarct of the posterior limb right internal capsule and insular ribbon is noted. No acute intracranial hemorrhage is present. No mass effect or midline shift. No abnormal extra-axial fluid collection. There is no hydrocephalus. There is no abnormal enhancement. ORBITS: The visualized portion of the orbits demonstrate no acute abnormality. SINUSES: The visualized paranasal  sinuses and mastoid air cells demonstrate no acute abnormality. SOFT TISSUES AND SKULL: Left sided NG tube is in place. No acute abnormality of the visualized skull or soft tissues. CTA HEAD: ANTERIOR CIRCULATION: Extensive atherosclerotic changes are present in the internal carotid arteries bilaterally. 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 It is reconstituted at the level of the ophthalmic artery. The ICA terminus is opacified. Marked decreased caliber opacification is present throughout the right MCA territory suggesting low perfusion. The anterior communicating artery is patent. Asymmetric attenuation of distal right AC branch vessels is noted. No aneurysm. POSTERIOR CIRCULATION: The vertebral arteries are codominant. Mild atherosclerotic changes are present within the vertebral arteries. Moderate focal stenosis is present in the distal right P2 segment with asymmetric attenuation of distal right PCA branches including the infarct territory. No significant stenosis of the basilar artery. No aneurysm. OTHER: No dural venous sinus thrombosis on this non-dedicated study. IMPRESSION: 1. Acute non-hemorrhagic infarct in the posterior limb of the right internal capsule . 2. Evolving nonhemorrhagic infarct in the right occipital and posterior inferior temporal lobes. 3.  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 4. Marked decreased caliber opacification throughout the right MCA territory, suggesting low perfusion. 5. Moderate focal stenosis of the distal right P2 segment with asymmetric attenuation of distal right PCA branches. 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 Proximal tibial resection with Biomet tibial replacement. New pulmonary embolism. 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 EXAM: MRI OF LOWER LEFT EXTREMITY WITHOUT AND WITH CONTRAST TECHNIQUE: Multiplanar, multisequence MR imaging of the left lower leg was performed both before and after administration of intravenous contrast. CONTRAST:  8mL GADAVIST GADOBUTROL 1 MMOL/ML IV SOLN COMPARISON:  Limited correlation made with left knee radiographs 04/22/2022 and lower leg radiographs 10/17/2006. No prior cross sectional imaging studies of the left lower leg available. FINDINGS: Technical note: Despite efforts by the technologist and patient, mild motion artifact is present on today's exam and could not be eliminated. This reduces exam sensitivity and specificity. Bones/Joint/Cartilage Both lower legs are included on the coronal images. Patient is status post bilateral total knee arthroplasty. There is a long tibial component which extends into the distal diaphysis. In addition, there is a distal left fibular plate and screws. There is significant susceptibility artifact related to the surgical hardware. Allowing for the artifact, no acute osseous findings or obvious recurrent tumor demonstrated. There is fluid signal along the medial aspect of the proximal tibia without apparent significant enhancement, possibly indicating pes anserine bursitis. Ligaments Not relevant  for exam/indication. Muscles and Tendons Generalized asymmetric muscular atrophy throughout the left lower leg. Soft tissues As above, fluid signal along the medial aspect of the proximal tibia which may reflect pes anserine bursitis. No other focal fluid collections are identified. There is mild subcutaneous edema in the left lower leg. IMPRESSION: 1. No evidence of recurrent tumor in the left lower leg. Assessment limited by artifact from the surgical hardware. If there is persistent concern of recurrent tumor, recommend radiographic correlation and consideration of CT. 2. Fluid signal along the medial aspect of the proximal tibia may reflect pes anserine bursitis. 3. Generalized asymmetric muscular atrophy throughout the left lower leg. 4. Postsurgical changes as described. Electronically Signed   By: [name] Gertrude HERO.D.  On: 03/21/2024 15:56   MR BRAIN WO CONTRAST Result Date: 03/21/2024 EXAM: MRI BRAIN WITHOUT CONTRAST 03/21/2024 02:41:50 PM TECHNIQUE: Multiplanar multisequence MRI of the head/brain was performed without the administration of intravenous contrast. COMPARISON: MRI Brain 01/21/2024, CT head 03/18/2024, CTA head/neck 01/21/2024. CLINICAL HISTORY: encephalopathy FINDINGS: BRAIN AND VENTRICLES: Redemonstration of extensive subacute infarct in the right PCA territory with underlying chronic right PCA territory infarct. New areas of cortical restricted diffusion along the right insula and mesial right temporal lobe suspicious for acute infarct. Diminished right ICA flow void, consistent with known occlusion. No intracranial hemorrhage. No mass. No midline shift. No hydrocephalus. The sella is unremarkable. ORBITS: No acute abnormality. SINUSES AND MASTOIDS: No acute abnormality. BONES AND SOFT TISSUES: Normal marrow signal. No acute soft tissue abnormality. IMPRESSION: 1. New areas of cortical restricted diffusion along the right insula and mesial right temporal lobe, suspicious for acute MCA  territory infarct. 2. Extensive subacute infarct in the right PCA territory. 3. Diminished right ICA flow void, consistent with known occlusion. Electronically signed by: Ryan Chess MD 03/21/2024 02:52 PM EST RP Workstation: [id number]   DG Abd Portable 1V Result Date: 03/21/2024 EXAM: 1 VIEW XRAY OF THE ABDOMEN 03/21/2024 02:01:00 PM COMPARISON: CT abdomen 03/20/2024. CLINICAL HISTORY: Encounter for feeding tube placement. FINDINGS: LINES, TUBES AND DEVICES: Feeding tube in place with tip in the duodenal bulb. Left PICC line in place with tip in the lower SVC region. BOWEL: Nonobstructive bowel gas pattern. SOFT TISSUES: Cholecystectomy clips noted. Aortic atheromatous vascular calcification. No opaque urinary calculi. BONES: Thoracolumbar spondylosis with levoconvex lumbar scoliosis with rotary component. LUNGS: The left lung apex is omitted. IMPRESSION: 1. Feeding tube in place with tip in the duodenal bulb. 2. Left PICC line in place with tip in the lower SVC region. 3. Thoracolumbar spondylosis with levoconvex lumbar scoliosis with rotary component. 4. Atherosclerosis. Electronically signed by: Ryan Salvage MD 03/21/2024 02:33 PM EST RP Workstation: [id number]   CT Angio Chest Pulmonary Embolism (PE) W or WO Contrast Result Date: 03/20/2024 CLINICAL DATA:  High probability for pulmonary embolism. EXAM: CT ANGIOGRAPHY CHEST WITH CONTRAST TECHNIQUE: Multidetector CT imaging of the chest was performed using the standard protocol during bolus administration of intravenous contrast. Multiplanar CT image reconstructions and MIPs were obtained to evaluate the vascular anatomy. RADIATION DOSE REDUCTION: This exam was performed according to the departmental dose-optimization program which includes automated exposure control, adjustment of the mA and/or kV according to patient size and/or use of iterative reconstruction technique. CONTRAST:  50mL OMNIPAQUE  IOHEXOL  350 MG/ML SOLN COMPARISON:  CT angiogram  chest 02/18/2024. FINDINGS: Cardiovascular: There are right lower lobe segmental pulmonary emboli. The heart is enlarged. Main pulmonary artery is mildly enlarged. There are atherosclerotic calcifications of the aorta and coronary arteries. There is no pericardial effusion. Aorta is normal in size. Left-sided central venous catheter ends in the SVC. Mediastinum/Nodes: The thyroid  gland is markedly enlarged and heterogeneous containing numerous nodules. The largest is in the right lobe measuring 3.2 cm and appears unchanged. There are no enlarged lymph nodes. The esophagus is within normal limits. Lungs/Pleura: There are few new scattered left upper lobe pulmonary nodular densities measuring up to 5 mm. Other smaller pulmonary nodules in the right lung are stable from prior. There is dependent atelectasis bilaterally. There is no new lung consolidation, pleural effusion or pneumothorax. Upper Abdomen: No acute abnormality. Cholecystectomy clips are present. Musculoskeletal: There is severe degenerative changes of the right shoulder and postsurgical changes. Degenerative changes affect the midthoracic spine. Review of  the MIP images confirms the above findings. IMPRESSION: 1. Right lower lobe segmental pulmonary emboli. Positive for acute PE with CTevidence of right heart strain (RV/LV Ratio = 1.1) consistent with at least submassive (intermediate risk) PE. The presence of right heart strain has been associated with an increased risk of morbidity and mortality. 2. Stable cardiomegaly. 3. Stable enlarged main pulmonary artery compatible with pulmonary arterial hypertension. 4. New scattered left upper lobe pulmonary nodules measuring up to 5 mm, possibly infectious/inflammatory, but indeterminate. No follow-up needed if patient is low-risk (and has no known or suspected primary neoplasm). Non-contrast chest CT can be considered in 12 months if patient is high-risk. This recommendation follows the consensus statement:  Guidelines for Management of Incidental Pulmonary Nodules Detected on CT Images: From the Fleischner Society 2017; Radiology 2017; 284:228-243. 5. Stable enlarged heterogeneous thyroid  gland with multiple nodules. Follow-up thyroid  ultrasound recommended. Aortic Atherosclerosis (ICD10-I70.0). Electronically Signed   By: Greig Pique M.D.   On: 03/20/2024 20:10   DG CHEST PORT 1 VIEW Result Date: 03/20/2024 EXAM: 1 VIEW(S) XRAY OF THE CHEST 03/20/2024 05:11:00 PM COMPARISON: 03/18/2024, 02/18/2024 CLINICAL HISTORY: S/P PICC central line placement FINDINGS: LINES, TUBES AND DEVICES: Left PICC with tip overlying the right atrium. LUNGS AND PLEURA: Low lung volumes. No focal pulmonary opacity. No pleural effusion. No pneumothorax. HEART AND MEDIASTINUM: Aortic atherosclerosis. No acute abnormality of the cardiac and mediastinal silhouettes. BONES AND SOFT TISSUES: Right shoulder surgical screw noted. No acute osseous abnormality. IMPRESSION: 1. Left PICC with tip overlying the right atrium. 2. Low lung volumes. Otherwise, no pneumonia or pulmonary edema. No pneumothorax. Electronically signed by: Rogelia Myers MD 03/20/2024 05:28 PM EST RP Workstation: [id number]   CT Angio Abd/Pel w/ and/or w/o Result Date: 03/20/2024 EXAM: CTA ABDOMEN AND PELVIS WITHOUT AND WITH CONTRAST 03/20/2024 11:35:00 AM TECHNIQUE: CTA images of the abdomen and pelvis without and with intravenous contrast. 100 mL of iohexol  (OMNIPAQUE ) 350 MG/ML injection was administered. Three-dimensional MIP/volume rendered formations were performed. Automated exposure control, iterative reconstruction, and/or weight based adjustment of the mA/kV was utilized to reduce the radiation dose to as low as reasonably achievable. COMPARISON: 03/18/2024. CLINICAL HISTORY: Mesenteric ischemia, acute. FINDINGS: VASCULATURE: AORTA: No acute finding. No abdominal aortic aneurysm. No dissection. CELIAC TRUNK: No acute finding. No occlusion or significant  stenosis. SUPERIOR MESENTERIC ARTERY: No acute finding. No occlusion or significant stenosis. RENAL ARTERIES: No acute finding. No occlusion or significant stenosis. ILIAC ARTERIES: No acute finding. No occlusion or significant stenosis. LIVER: The liver is unremarkable. GALLBLADDER AND BILE DUCTS: Status post cholecystectomy. No biliary ductal dilatation. SPLEEN: The spleen is unremarkable. PANCREAS: The pancreas is unremarkable. ADRENAL GLANDS: Bilateral adrenal glands demonstrate no acute abnormality. KIDNEYS, URETERS AND BLADDER: Bilateral renal cortical scarring is noted. Bilateral renal cysts are noted. No stones in the kidneys or ureters. No hydronephrosis. No perinephric or periureteral stranding. Urinary bladder is unremarkable. GI AND BOWEL: Stomach and duodenal sweep demonstrate no acute abnormality. There is no bowel obstruction. No abnormal bowel wall thickening or distension. REPRODUCTIVE: Reproductive organs are unremarkable. PERITONEUM AND RETRPERITONEUM: No ascites or free air. LUNG BASE: No acute abnormality. LYMPH NODES: No lymphadenopathy. BONES AND SOFT TISSUES: No acute abnormality of the bones. No acute soft tissue abnormality. IMPRESSION: 1. No acute findings. Electronically signed by: Lynwood Seip MD 03/20/2024 12:14 PM EST RP Workstation: [id number]   ECHOCARDIOGRAM COMPLETE Result Date: 03/19/2024    ECHOCARDIOGRAM REPORT   Patient Name:   MAILA DUKES Date of Exam: 03/19/2024 Medical Rec #:  [id number]     Height:       63.0 in Accession #:    [id number]    Weight:       178.0 lb Date of Birth:  [birth year]    BSA:          1.840 m Patient Age:    73 years      BP:           132/90 mmHg Patient Gender: F             HR:           95 bpm. Exam Location:  Inpatient Procedure: 2D Echo (Both Spectral and Color Flow Doppler were utilized during            procedure). STAT ECHO Indications:    acute diastolic chf  History:        Patient has prior history of Echocardiogram examinations, most                  recent 01/21/2024. Chronic kidney disease, Arrythmias:Atrial                 Fibrillation; Risk Factors:Hypertension, Diabetes and                 Dyslipidemia.  Sonographer:    Tinnie Barefoot RDCS Referring Phys: [id number] JEFFREY C HATCHER IMPRESSIONS  1. Left ventricular ejection fraction, by estimation, is 50 to 55%. The left ventricle has low normal function. The left ventricle has no regional wall motion abnormalities. Left ventricular diastolic function could not be evaluated.  2. Right ventricular systolic function is mildly reduced. The right ventricular size is normal. There is severely elevated pulmonary artery systolic pressure.  3. Left atrial size was moderately dilated.  4. The mitral valve is degenerative. Mild mitral valve regurgitation. No evidence of mitral stenosis.  5. The aortic valve is normal in structure. Aortic valve regurgitation is not visualized. No aortic stenosis is present.  6. The inferior vena cava is normal in size with greater than 50% respiratory variability, suggesting right atrial pressure of 3 mmHg. FINDINGS  Left Ventricle: Left ventricular ejection fraction, by estimation, is 50 to 55%. The left ventricle has low normal function. The left ventricle has no regional wall motion abnormalities. The left ventricular internal cavity size was normal in size. There is no left ventricular hypertrophy. Left ventricular diastolic function could not be evaluated. Right Ventricle: The right ventricular size is normal. No increase in right ventricular wall thickness. Right ventricular systolic function is mildly reduced. There is severely elevated pulmonary artery systolic pressure. The tricuspid regurgitant velocity is 3.49 m/s, and with an assumed right atrial pressure of 15 mmHg, the estimated right ventricular systolic pressure is 63.7 mmHg. Left Atrium: Left atrial size was moderately dilated. Right Atrium: Right atrial size was normal in size. Pericardium: There is no  evidence of pericardial effusion. Mitral Valve: The mitral valve is degenerative in appearance. Mild mitral annular calcification. Mild mitral valve regurgitation. No evidence of mitral valve stenosis. Tricuspid Valve: The tricuspid valve is normal in structure. Tricuspid valve regurgitation is mild . No evidence of tricuspid stenosis. Aortic Valve: The aortic valve is normal in structure. Aortic valve regurgitation is not visualized. No aortic stenosis is present. Pulmonic Valve: The pulmonic valve was normal in structure. Pulmonic valve regurgitation is mild. No evidence of pulmonic stenosis. Aorta: The aortic root is normal in size and structure. Venous: The inferior vena cava is normal in size with greater  than 50% respiratory variability, suggesting right atrial pressure of 3 mmHg. IAS/Shunts: The atrial septum is grossly normal.  LEFT VENTRICLE PLAX 2D LVIDd:         4.90 cm LVIDs:         3.30 cm LV PW:         1.00 cm LV IVS:        0.90 cm LVOT diam:     2.10 cm LV SV:         42 LV SV Index:   23 LVOT Area:     3.46 cm  LEFT ATRIUM           Index        RIGHT ATRIUM           Index LA diam:      4.00 cm 2.17 cm/m   RA Area:     13.30 cm LA Vol (A4C): 61.7 ml 33.53 ml/m  RA Volume:   30.00 ml  16.30 ml/m  AORTIC VALVE LVOT Vmax:   62.60 cm/s LVOT Vmean:  40.900 cm/s LVOT VTI:    0.120 m  AORTA Ao Root diam: 2.70 cm Ao Asc diam:  3.10 cm TRICUSPID VALVE TR Peak grad:   48.7 mmHg TR Vmax:        349.00 cm/s  SHUNTS Systemic VTI:  0.12 m Systemic Diam: 2.10 cm Morene Brownie Electronically signed by Morene Brownie Signature Date/Time: 03/19/2024/6:12:10 PM    Final    US  EKG SITE RITE Result Date: 03/19/2024 If Site Rite image not attached, placement could not be confirmed due to current cardiac rhythm.  DG CHEST PORT 1 VIEW Result Date: 03/18/2024 EXAM: 1 VIEW(S) XRAY OF THE CHEST 03/18/2024 11:28:44 PM COMPARISON: 02/18/2024 CLINICAL HISTORY: Chest pain. FINDINGS: LUNGS AND PLEURA: Mild  eventration of the hemidiaphragm. No focal pulmonary opacity. No pulmonary edema. No pleural effusion. No pneumothorax. HEART AND MEDIASTINUM: No acute abnormality of the cardiac and mediastinal silhouettes. BONES AND SOFT TISSUES: Postsurgical changes involving the right shoulder. No acute osseous abnormality. IMPRESSION: 1. No acute cardiopulmonary findings. Electronically signed by: Pinkie Pebbles MD 03/18/2024 11:34 PM EST RP Workstation: [id number]   CT ABDOMEN PELVIS W CONTRAST Result Date: 03/18/2024 CLINICAL DATA:  Acute abdominal pain EXAM: CT ABDOMEN AND PELVIS WITH CONTRAST TECHNIQUE: Multidetector CT imaging of the abdomen and pelvis was performed using the standard protocol following bolus administration of intravenous contrast. RADIATION DOSE REDUCTION: This exam was performed according to the departmental dose-optimization program which includes automated exposure control, adjustment of the mA and/or kV according to patient size and/or use of iterative reconstruction technique. CONTRAST:  75mL OMNIPAQUE  IOHEXOL  350 MG/ML SOLN COMPARISON:  02/18/2024 FINDINGS: Lower chest: No acute pleural or parenchymal lung disease. Hepatobiliary: No focal liver abnormality is seen. Status post cholecystectomy. No biliary dilatation. Pancreas: Unremarkable. No pancreatic ductal dilatation or surrounding inflammatory changes. Spleen: Normal in size without focal abnormality. Adrenals/Urinary Tract: Heterogeneous areas of decreased cortical enhancement are seen within the dorsal aspect of the left kidney, which could reflect acute pyelonephritis. Please correlate with urinalysis. There are other scattered areas of cortical scarring within the bilateral kidneys. Small simple appearing renal cortical cysts do not require specific imaging follow-up. The adrenals and bladder are unremarkable. Stomach/Bowel: No bowel obstruction or ileus. No bowel wall thickening or inflammatory change. Small hiatal hernia.  Vascular/Lymphatic: Aortic atherosclerosis. No enlarged abdominal or pelvic lymph nodes. Reproductive: Uterus and bilateral adnexa are unremarkable. Other: No free fluid or free intraperitoneal gas. No  abdominal wall hernia. Musculoskeletal: No acute or destructive bony abnormalities. Severe spondylosis at the L3-4 level. Reconstructed images demonstrate no additional findings. IMPRESSION: 1. Heterogeneous areas of decreased cortical enhancement within the left kidney, suspicious for pyelonephritis. Please correlate with urinalysis. 2. Otherwise no acute intra-abdominal or intrapelvic process. 3. Small hiatal hernia. 4.  Aortic Atherosclerosis (ICD10-I70.0). Electronically Signed   By: Ozell Daring M.D.   On: 03/18/2024 18:58   CT Head Wo Contrast Result Date: 03/18/2024 CLINICAL DATA:  Altered level of consciousness EXAM: CT HEAD WITHOUT CONTRAST TECHNIQUE: Contiguous axial images were obtained from the base of the skull through the vertex without intravenous contrast. RADIATION DOSE REDUCTION: This exam was performed according to the departmental dose-optimization program which includes automated exposure control, adjustment of the mA and/or kV according to patient size and/or use of iterative reconstruction technique. COMPARISON:  03/18/2024 at 4 p.m. FINDINGS: Brain: Stable chronic right PCA territory infarct with developing encephalomalacia. No evidence of acute infarct or hemorrhage. Lateral ventricles and midline structures are stable. No acute extra-axial fluid collections. No mass effect. Vascular: No hyperdense vessel or unexpected calcification. Skull: Normal. Negative for fracture or focal lesion. Sinuses/Orbits: No acute finding. Other: None. IMPRESSION: 1. Stable sequela of chronic right PCA territory infarct. 2. No acute intracranial process. Electronically Signed   By: Ozell Daring M.D.   On: 03/18/2024 18:54   CT Head Wo Contrast Result Date: 03/18/2024 CLINICAL DATA:  Altered level of  consciousness, headache EXAM: CT HEAD WITHOUT CONTRAST TECHNIQUE: Contiguous axial images were obtained from the base of the skull through the vertex without intravenous contrast. RADIATION DOSE REDUCTION: This exam was performed according to the departmental dose-optimization program which includes automated exposure control, adjustment of the mA and/or kV according to patient size and/or use of iterative reconstruction technique. COMPARISON:  02/18/2024 FINDINGS: Examination is significantly limited by patient motion throughout the exam, despite multiple repeat imaging attempts. Brain: Continued evolution of the prior right PCA territory infarct. No evidence of hemorrhagic transformation. Stable chronic small-vessel ischemic changes within the right thalamus and periventricular white matter. No evidence of acute infarct or hemorrhage. Lateral ventricles and remaining midline structures are stable. No acute extra-axial fluid collections. No mass effect. Vascular: No hyperdense vessel or unexpected calcification. Skull: Normal. Negative for fracture or focal lesion. Sinuses/Orbits: No acute finding. Other: None. IMPRESSION: 1. Limited study due to patient motion throughout the exam. 2. Continued evolution of the chronic right PCA territory infarct noted previously. No evidence of hemorrhagic transformation. 3. Otherwise no acute intracranial process. Electronically Signed   By: Ozell Daring M.D.   On: 03/18/2024 16:14     PHYSICAL EXAM  Temp:  [97.5 F (36.4 C)-98.6 F (37 C)] 97.6 F (36.4 C) (11/16 1245) Pulse Rate:  [60-99] 88 (11/16 1245) Resp:  [17-20] 19 (11/16 1245) BP: (86-183)/(43-133) 129/106 (11/16 1245) SpO2:  [77 %-99 %] 92 % (11/16 1245) Weight:  [81.4 kg] 81.4 kg (11/16 0617)  General - Well nourished, well developed, in no apparent distress, mild agitation.  Ophthalmologic - fundi not visualized due to noncooperation.  Cardiovascular - irregularly irregular heart rate and  rhythm  Neuro - awake, alert, eyes open, however, flat affect, confused   oriented to age but refused to answer other orientation questions.   Speech is fluent but often tangential.  Easy distractibility.  Right gaze gaze preference, tracking to the left but incomplete, not blinking to visual threat on the left but able to blink to visual threat on the right.  No facial droop. Tongue protrusion not corporative. Bilateral UEs not raising up as requested. Bilaterally LEs withdraw to pain seems symmetrical. Sensation, coordination not corporative and gait not tested.   ASSESSMENT/PLAN Ms. JAMEY DEMCHAK is a 73 y.o. 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 No TNK given due to outside window and on Eliquis .    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  Subacute infarct right PCA territory. 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 Marked decreased caliber opacification throughout the right MCA territory, suggesting low perfusion. Moderate focal stenosis of the distal right P2 segment with asymmetric attenuation of distal right PCA branches. 2D Echo EF 50 to 55%, RV function is mildly reduced Carotid Doppler pending LDL 87 HgbA1c 7.7 Heparin IV for VTE prophylaxis Eliquis  (apixaban ) daily prior to admission, now on heparin IV per stroke protocol Ongoing aggressive stroke risk factor management Therapy recommendations:  CIR Disposition: Pending  History of stroke 01/2024 admitted for right large PCA infarct.  CT head and neck showed right ICA origin occlusion with distal reconstitution.  Right P2  occlusion.  CTP 9/42.  EF 60 to 65%.  LDL 123, A1c 6.5, UDS negative.  Discharged on Eliquis , aspirin , Crestor  and Zetia  10.  PE CTA chest 11/13 showed right lower lobe submassive PE Was on heparin IV LE venous Doppler pending Pan CT no malignancy MRI tibia did not show recurrent malignancy of left tibial GCT No TCD bubble study needed as current stroke not likely from paradoxical emboli  Abnormal EEG Long-term EEG showed sharp waves at right hemisphere Likely due to underlying stroke Continue long-term EEG On Keppra 500 twice daily  A-fib Was on Eliquis  PTA Now heparin IV per stroke protocol May consider switch to DOAC at discharge  Diabetes DKA HgbA1c 7.7 goal < 7.0 Currently on insulin  drip CBG monitoring SSI DM education and close PCP follow up  Hypertension BP fluctuate Avoid low BP given chronic right ICA occlusion Long term BP goal normotensive  Hyperlipidemia Home meds: Crestor  20 and Zetia  10 LDL 87, goal < 70 Now on home Crestor  20 and Zetia  10 Continue statin at discharge  Dysphagia Did not pass swallow Status post core Trak On tube feeding  Other Stroke Risk Factors Advanced age Obesity, Body mass index is 31.79 kg/m.   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  Continue management of medical parameters as per primary team.  Continue tube feeds for now and speech therapy to do swallow eval tomorrow.   I personally spent a total of 35 minutes in the care of the patient today including getting/reviewing separately obtained history, performing a medically appropriate exam/evaluation, counseling and educating, placing orders, referring and communicating with other  health care professionals, documenting clinical information in the EHR, independently interpreting results, and coordinating care.         Eather Popp, MD Stroke Neurology 03/23/2024 1:45 PM    To contact Stroke Continuity  provider, please refer to [email or web address]. After hours, contact General Neurology

## 2024-03-23 NOTE — Procedures (Signed)
 Patient Name: Megan Fischer  MRN: [id number]  Epilepsy Attending: Arlin MALVA Krebs  Referring Physician/Provider: Khaliqdina, Salman, MD  Duration: 03/22/2024 1116 to 03/23/2024 1116  Patient history: 73yo F with right MCA stroke. EEG to evaluate for seizure.  Level of alertness: Awake/ lethargic, asleep  AEDs during EEG study: LEV  Technical aspects: This EEG study was done with scalp electrodes positioned according to the 10-20 International system of electrode placement. Electrical activity was reviewed with band pass filter of 1-70Hz , sensitivity of 7 uV/mm, display speed of 21mm/sec with a 60Hz  notched filter applied as appropriate. EEG data were recorded continuously and digitally stored.  Video monitoring was available and reviewed as appropriate.  Description: No clear posterior dominant rhythm was seen. Sleep was characterized by vertex waves, sleep spindles (12 to 14 Hz), maximal frontocentral region. EEG showed continuous generalized and maximal right posterior quadrant 3 to 6 Hz theta-delta slowing. Abundant sharp waves were noted in right posterior quadrant, at times qasi-period at 0.5hz . Hyperventilation and photic stimulation were not performed.     ABNORMALITY - Sharp wave, right posterior quadrant - Continuous slow, generalized and maximal right posterior quadrant  IMPRESSION: This study initially showed evidence of epileptogenicity arising from right posterior quadrant. Additionally there is cortical dysfunction arising from right posterior quadrant likely secondary to underlying structural abnormality. Lastly there is generalized cerebral dysfunction (encephalopathy). No seizures were seen throughout the recording.  [name]

## 2024-03-23 NOTE — Progress Notes (Signed)
 LTM VIDEO EEG discontinued - no skin breakdown at [facility].

## 2024-03-23 NOTE — Progress Notes (Signed)
 VASCULAR LAB    Bilateral lower extremity venous duplex has been performed.  See CV proc for preliminary results.   Jennefer Kopp, RVT 03/23/2024, 4:19 PM

## 2024-03-23 NOTE — Progress Notes (Signed)
 PHARMACY - ANTICOAGULATION  Pharmacy Consult for heparin  Indication: submassive PE  Brief A/P: aPTT supratherapeutic Decrease Heparin rate   Allergies  Allergen Reactions   Aspirin  Nausea Only    Patient stated to neuro that it caused an upset stomach   Codeine Other (See Comments)    Unknown    Fentanyl  Other (See Comments)    hypoxia   Oxycodone Hcl Other (See Comments)    Unknown    Penicillins Other (See Comments)    Unknown    Chlorhexidine Gluconate Rash    Patient Measurements: Height: 5' 3 (160 cm) Weight: 81.4 kg (179 lb 7.3 oz) (1 blanket 1 pillow in bed at time of weight) IBW/kg (Calculated) : 52.4 HEPARIN DW (KG): 70.1  Vital Signs: Temp: 98.2 F (36.8 C) (11/16 0836) Temp Source: Axillary (11/16 0836) BP: 148/133 (11/16 0836) Pulse Rate: 65 (11/16 0836)  Labs: Recent Labs    03/21/24 0500 03/21/24 0900 03/22/24 0527 03/22/24 2320 03/23/24 0330 03/23/24 1015  HGB 15.5*  --  14.1  --  13.6  --   HCT 45.9  --  42.0  --  39.7  --   PLT 254  --  200  --  216  --   APTT 159*  --   --  136*  --  22*  HEPARINUNFRC >1.10*  --   --  0.96*  --   --   CREATININE 1.32* 1.66* 2.32*  --  2.20*  --   CKTOTAL 148  --   --   --   --   --     Estimated Creatinine Clearance: 23 mL/min (A) (by C-G formula based on SCr of 2.2 mg/dL (H)).  Assessment: 73 y.o. female admitted with CVA, found to have RLL submassive PE, for heparin.  11/16 AM: APTT is subtherapeutic at 22 seconds after being elevated at 159 and 166 seconds on 11/15. Heparin was held for 1 hour following the elevated aPTT last night (11/15). Per RN, the patient was having issues with her PICC line this morning, so the infusion was also paused again from 0730 to 1000. Will leave rate the same and recheck aPTT in 8 hours.    Goal of Therapy:  Heparin level 0.3-0.5 units/ml aPTT 66-85 seconds Monitor platelets by anticoagulation protocol: Yes   Plan:  Continue heparin at 750 units/hr Check aPTT in 8  hours    B. Maegan Lunden Stieber, PharmD PGY-1 Pharmacy Resident [facility] 03/23/2024 11:46 AM

## 2024-03-23 NOTE — Progress Notes (Signed)
 Subjective:  Megan Fischer is a 73 y.o. with a pertinent PMH of DM2, Afib (on eliquis ), CKD4, CVA (01/2024), and past multinodular goiter, who presented with headache and agitation who was admitted for encephalopathy. 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   Interval Update 11/16 There were no acute events overnight. Vital signs have been stable with elevated HTN in the setting of recent stroke, and morning labs show elevated Cr at 2.2 (likely contrast induced), K 3.5 (repletion ordered) and Na 131 (LR bolus given). Anion gap 10.   On interview, patient was somnolent and difficult to arouse while receiving her EEG. Will follow up later in the day.  Objective:  Vital signs in last 24 hours: Vitals:   03/23/24 0332 03/23/24 0332 03/23/24 0617 03/23/24 0836  BP: (!) 118/43 (!) 148/125  (!) 148/133  Pulse: 60 72  65  Resp: 18 19  17   Temp: 98.6 F (37 C) 98.3 F (36.8 C)  98.2 F (36.8 C)  TempSrc: Oral Oral  Axillary  SpO2: 97% (!) 77%  92%  Weight:   81.4 kg   Height:        Physical Exam Constitutional:      Appearance: She is ill-appearing.  Cardiovascular:     Rate and Rhythm: Rhythm irregular.  Pulmonary:     Effort: No respiratory distress.  Abdominal:     Palpations: Abdomen is soft.  Skin:    General: Skin is warm and dry.  Neurological:     Comments: somnolent        Latest Ref Rng & Units 03/23/2024    3:30 AM 03/22/2024    5:27 AM 03/21/2024    5:00 AM  CBC  WBC 4.0 - 10.5 K/uL 8.1  7.3  8.5   Hemoglobin 12.0 - 15.0 g/dL 86.3  85.8  84.4   Hematocrit 36.0 - 46.0 % 39.7  42.0  45.9   Platelets 150 - 400 K/uL 216  200  254         Latest Ref Rng & Units 03/23/2024    3:30 AM 03/22/2024    5:27 AM 03/21/2024    9:00 AM  BMP  Glucose 70 - 99 mg/dL 838  830  858   BUN 8 - 23 mg/dL 38  34  21   Creatinine 0.44 - 1.00 mg/dL 7.79   7.67  8.33   Sodium 135 - 145 mmol/L 131  128  132   Potassium 3.5 - 5.1 mmol/L 3.5  3.7  4.0   Chloride 98 - 111 mmol/L 98  94  94   CO2 22 - 32 mmol/L 23  23  25    Calcium  8.9 - 10.3 mg/dL 8.4  8.3  8.6      Assessment/Plan:  Principal Problem:   Acute ischemic right MCA stroke (HCC) Active Problems:   Atrial fibrillation (HCC)   Hypertension   DM (diabetes mellitus) type II controlled with renal manifestation (HCC)   CKD (chronic kidney disease) stage 3, GFR 30-59 ml/min (HCC)   Abdominal pain, epigastric   Hemiplegia and hemiparesis following cerebral infarction affecting left non-dominant side (HCC)   Headache   Vomiting   Hyperthyroidism   Delirium   High anion gap metabolic acidosis   Lactic acidosis   Euglycemic DKA (diabetic ketoacidosis) (HCC)   Pulmonary artery hypertension (HCC)   Right ventricular dysfunction  History of recent stroke   Pulmonary embolism (HCC)   Hypercoagulable state   AKI (acute kidney injury)   Malnutrition of moderate degree   H/O ischemic right PCA stroke  #Encephalopathy 2/2 acute R MCA ischemic stroke #H/o R PCA stroke (09/20225) MRI of brain revealed an acute R MCA stroke with petechial hemorrhage of the prior R PCA stroke (09/20225) and heparin was discontinued at that time. Suspect patient's CVA is embolic 2/2 the afib.  Plan: -Resume heparin with stroke protocol -Continue permissive HTN today -Asa 81 mg & Crestor  20 mg  -F/u EEG -F/u transcranial bubble study  -PMT consulted  -Continue with coretrack for nutrition for this time  # Abnormal EEG Thought to be from underlying stroke. Possible epileptogenecity from Right posterior quadrant, thought to be from underlying structural abnormality. Generalized cerebral dysfunction suggestive of encephalopathy. No seizures captured on EEG. Taken to because of waxing and waning.  - Continue Keppra 500mg  BID  #PE, acute Patient was found to have a right lower lobe PE and was started  on a heparin drip that has since been discontinued 2/2 petechial hemorrhage in her prior R PCA infarct. We dicussed patient's case with 2 different IR physicians who do NOT suspect a submassive PE and therefore did NOT offer thrombectomy in this patient. She remains HDS on RA.  Plan: -Resume heparin today with the stroke protocol  -F/u BL LE DVT US   #AKI Baseline 1.0. Elevated currently, likely from frequent imaging contrast. Bladder scan revealed 192cc, unsure whether this is PVR or not.  Plan: -1 L IVF bolus this morning   #T2DM S/p treatment of euglycemic DKA and persistent lactic acidosis. Patient's FBG is at goal at 163 and anion gap has remained closed at 11.  Plan: -Continue Semglee 10 units -Continue Moderate SSI  #Hyperthyroidism: -Continue methimazole  10 mg daily and cholestyramine 4g QID  Resolved Problems:  Euglycemic DKA Lactic acidosis  __________________________________  Code Status: DNR/DNI VTE Prophylaxis: Heparin Diet: NPO, Coretrack  IVF:N/A Barriers to Discharge: Evaluation of CVA vs PE Dispo: Date TBD based on clinical improvement and GOC.  [name], DO 03/23/2024, 9:18 AM Please contact the on call pager at: [phone]

## 2024-03-23 NOTE — Progress Notes (Signed)
 PHARMACY - ANTICOAGULATION  Pharmacy Consult for heparin  Indication: submassive PE  Brief A/P: aPTT supratherapeutic Decrease Heparin rate   Allergies  Allergen Reactions   Aspirin  Nausea Only    Patient stated to neuro that it caused an upset stomach   Codeine Other (See Comments)    Unknown    Fentanyl  Other (See Comments)    hypoxia   Oxycodone Hcl Other (See Comments)    Unknown    Penicillins Other (See Comments)    Unknown    Chlorhexidine Gluconate Rash    Patient Measurements: Height: 5' 3 (160 cm) Weight: 81.4 kg (179 lb 7.3 oz) (1 blanket 1 pillow in bed at time of weight) IBW/kg (Calculated) : 52.4 HEPARIN DW (KG): 70.1  Vital Signs: Temp: 98.3 F (36.8 C) (11/16 1619) Temp Source: Axillary (11/16 1619) BP: 145/70 (11/16 1619) Pulse Rate: 78 (11/16 1619)  Labs: Recent Labs    03/21/24 0500 03/21/24 0900 03/22/24 0527 03/22/24 2320 03/23/24 0330 03/23/24 1015 03/23/24 1800  HGB 15.5*  --  14.1  --  13.6  --   --   HCT 45.9  --  42.0  --  39.7  --   --   PLT 254  --  200  --  216  --   --   APTT 159*  --   --  136*  --  22* 67*  HEPARINUNFRC >1.10*  --   --  0.96*  --   --   --   CREATININE 1.32* 1.66* 2.32*  --  2.20*  --   --   CKTOTAL 148  --   --   --   --   --   --     Estimated Creatinine Clearance: 23 mL/min (A) (by C-G formula based on SCr of 2.2 mg/dL (H)).  Assessment: 73 y.o. female admitted with CVA, found to have RLL submassive PE, for heparin.  11/16 AM: APTT is subtherapeutic at 22 seconds after being elevated at 159 and 166 seconds on 11/15. Heparin was held for 1 hour following the elevated aPTT last night (11/15). Per RN, the patient was having issues with her PICC line this morning, so the infusion was also paused again from 0730 to 1000. Will leave rate the same and recheck aPTT in 8 hours.  11/16 PM: aPTT 67 sec is therapeutic on heparin 750 units/hr. No issues with the infusion or bleeding reported.    Goal of Therapy:   Heparin level 0.3-0.5 units/ml aPTT 66-85 seconds Monitor platelets by anticoagulation protocol: Yes   Plan:  Continue heparin at 750 units/hr Check daily aPTT/HL until correlation Continue to monitor H&H and platelets  Rocky Slade, PharmD, BCPS 03/23/2024 7:41 PM   Please check AMION for all [facility] phone numbers After 10:00 PM, call Main Pharmacy [phone]

## 2024-03-23 NOTE — Progress Notes (Signed)
 VASCULAR LAB    Carotid duplex was attempted.  However, patient extremely agitated, altered, and non compliant with instructions/directives. Dr. Rosemarie aware  Megan Fischer, Javaeh Muscatello, RVT 03/23/2024, 5:26 PM

## 2024-03-23 NOTE — Progress Notes (Signed)
 LTM maint complete - no skin breakdown under: Fp1 Fp2  Serviced Fp1 Fp2 [facility] monitored, Event button test confirmed by [facility].

## 2024-03-24 ENCOUNTER — Inpatient Hospital Stay (HOSPITAL_COMMUNITY)

## 2024-03-24 DIAGNOSIS — I2699 Other pulmonary embolism without acute cor pulmonale: Secondary | ICD-10-CM | POA: Diagnosis not present

## 2024-03-24 DIAGNOSIS — E869 Volume depletion, unspecified: Secondary | ICD-10-CM | POA: Diagnosis not present

## 2024-03-24 DIAGNOSIS — I639 Cerebral infarction, unspecified: Secondary | ICD-10-CM

## 2024-03-24 DIAGNOSIS — I63511 Cerebral infarction due to unspecified occlusion or stenosis of right middle cerebral artery: Secondary | ICD-10-CM | POA: Diagnosis not present

## 2024-03-24 DIAGNOSIS — E1110 Type 2 diabetes mellitus with ketoacidosis without coma: Secondary | ICD-10-CM | POA: Diagnosis not present

## 2024-03-24 DIAGNOSIS — Z515 Encounter for palliative care: Secondary | ICD-10-CM | POA: Diagnosis not present

## 2024-03-24 DIAGNOSIS — N179 Acute kidney failure, unspecified: Secondary | ICD-10-CM | POA: Diagnosis not present

## 2024-03-24 DIAGNOSIS — I959 Hypotension, unspecified: Secondary | ICD-10-CM | POA: Diagnosis not present

## 2024-03-24 DIAGNOSIS — I63231 Cerebral infarction due to unspecified occlusion or stenosis of right carotid arteries: Secondary | ICD-10-CM | POA: Diagnosis not present

## 2024-03-24 DIAGNOSIS — Z7189 Other specified counseling: Secondary | ICD-10-CM | POA: Diagnosis not present

## 2024-03-24 DIAGNOSIS — R569 Unspecified convulsions: Secondary | ICD-10-CM | POA: Diagnosis not present

## 2024-03-24 LAB — CBC
HCT: 36.3 % (ref 36.0–46.0)
Hemoglobin: 12.2 g/dL (ref 12.0–15.0)
MCH: 28.5 pg (ref 26.0–34.0)
MCHC: 33.6 g/dL (ref 30.0–36.0)
MCV: 84.8 fL (ref 80.0–100.0)
Platelets: 200 K/uL (ref 150–400)
RBC: 4.28 MIL/uL (ref 3.87–5.11)
RDW: 14.8 % (ref 11.5–15.5)
WBC: 6.5 K/uL (ref 4.0–10.5)
nRBC: 0 % (ref 0.0–0.2)

## 2024-03-24 LAB — GLUCOSE, CAPILLARY
Glucose-Capillary: 200 mg/dL — ABNORMAL HIGH (ref 70–99)
Glucose-Capillary: 188 mg/dL — ABNORMAL HIGH (ref 70–99)
Glucose-Capillary: 169 mg/dL — ABNORMAL HIGH (ref 70–99)
Glucose-Capillary: 198 mg/dL — ABNORMAL HIGH (ref 70–99)
Glucose-Capillary: 167 mg/dL — ABNORMAL HIGH (ref 70–99)

## 2024-03-24 LAB — BASIC METABOLIC PANEL WITH GFR
Anion gap: 9 (ref 5–15)
BUN: 32 mg/dL — ABNORMAL HIGH (ref 8–23)
CO2: 23 mmol/L (ref 22–32)
Calcium: 8.2 mg/dL — ABNORMAL LOW (ref 8.9–10.3)
Chloride: 104 mmol/L (ref 98–111)
Creatinine, Ser: 1.68 mg/dL — ABNORMAL HIGH (ref 0.44–1.00)
GFR, Estimated: 32 mL/min — ABNORMAL LOW (ref >60)
Glucose, Bld: 201 mg/dL — ABNORMAL HIGH (ref 70–99)
Potassium: 3.8 mmol/L (ref 3.5–5.1)
Sodium: 136 mmol/L (ref 135–145)

## 2024-03-24 LAB — APTT: aPTT: 77 s — ABNORMAL HIGH (ref 24–36)

## 2024-03-24 LAB — MAGNESIUM: Magnesium: 2.2 mg/dL (ref 1.7–2.4)

## 2024-03-24 LAB — HEPARIN LEVEL (UNFRACTIONATED)
Heparin Unfractionated: 0.36 [IU]/mL (ref 0.30–0.70)
Heparin Unfractionated: 0.56 [IU]/mL (ref 0.30–0.70)

## 2024-03-24 LAB — PHOSPHORUS: Phosphorus: 3 mg/dL (ref 2.5–4.6)

## 2024-03-24 MED ORDER — FREE WATER
250.0000 mL | Freq: Four times a day (QID) | Status: DC
  Administered 2024-03-24 – 2024-03-25 (×5): 250 mL

## 2024-03-24 MED ORDER — INSULIN GLARGINE-YFGN 100 UNIT/ML ~~LOC~~ SOLN
12.0000 [IU] | Freq: Every day | SUBCUTANEOUS | Status: DC
  Administered 2024-03-24 – 2024-03-27 (×4): 12 [IU] via SUBCUTANEOUS
  Filled 2024-03-24 (×5): qty 0.12

## 2024-03-24 NOTE — Progress Notes (Signed)
 Physical Therapy Treatment Patient Details Name: Megan Fischer MRN: [id number] DOB: 1950-10-06 Today's Date: 03/24/2024   History of Present Illness 73 y/o F presenting to ED on 11/11 with worsening headache, restlessness, confusion x3 days. CTA significant for subsegmental PE. MRI head with R MCA CVA and petechial hemorrhage in territory of recent R PCA CVA.    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 Pt oriented to self and DOB, but unable to state place and situation. Once visitors left room, pt quickly became lethargic and then obtunded. Pt would not arouse to voice, tactile input to BUE's and face, inc lighting in room, or rolling for pericare. Due to this, pt totalA for rolling today, with 2+ required to complete pericare. Suspect that with inc alertness, could've progressed PT treatment today. Continuing to recommend post-acute rehab >3hrs/day with hopes of progression in mobility with future sessions. Acute PT to follow.    If plan is discharge home, recommend the following: Two people to help with walking and/or transfers;Two people to help with bathing/dressing/bathroom;Supervision due to cognitive status;Direct supervision/assist for medications management;Direct supervision/assist for financial management;Assist for transportation   Can travel by private vehicle        Equipment Recommendations  None recommended by PT    Recommendations for Other Services Rehab consult     Precautions / Restrictions Precautions Precautions: Fall;Other (comment) Recall of Precautions/Restrictions: Impaired Precaution/Restrictions Comments: L homonymous hemianopsia, bilateral mitts, NGT Restrictions Weight Bearing Restrictions Per Provider Order: No     Mobility  Bed  Mobility Overal bed mobility: Needs Assistance Bed Mobility: Rolling Rolling: Total assist         General bed mobility comments: Pt requiring totalA for rolls for pericare due to level of arousal.    Transfers                        Ambulation/Gait                   Stairs             Wheelchair Mobility     Tilt Bed    Modified Rankin (Stroke Patients Only)       Balance                                            Communication Communication Communication: Impaired Factors Affecting Communication: Difficulty expressing self (Delayed)  Cognition Arousal: Alert, Lethargic, Obtunded Behavior During Therapy: Flat affect   PT - Cognitive impairments: Safety/Judgement, Problem solving, Difficult to assess, Orientation Difficult to assess due to: Level of arousal                     PT - Cognition Comments: Pt A&O to self and birthday. Stated she was in her living room when asked for place, and was unable to state situation. Following commands: Impaired Following commands impaired: Follows one step commands inconsistently    Cueing Cueing Techniques: Verbal cues, Tactile cues  Exercises      General Comments General comments (skin integrity, edema, etc.): After visitors left room, pt became lethargic and then obtunded and unarousable with multimodal stimulation  techniques attempted.      Pertinent Vitals/Pain Pain Assessment Pain Assessment: Faces Faces Pain Scale: No hurt Pain Intervention(s): Monitored during session    Home Living                          Prior Function            PT Goals (current goals can now be found in the care plan section) Acute Rehab PT Goals PT Goal Formulation: Patient unable to participate in goal setting Time For Goal Achievement: 04/05/24 Potential to Achieve Goals: Fair Progress towards PT goals: Not progressing toward goals - comment (Very  lethargic/obtunded throughout session today)    Frequency    Min 3X/week      PT Plan      Co-evaluation              AM-PAC PT 6 Clicks Mobility   Outcome Measure  Help needed turning from your back to your side while in a flat bed without using bedrails?: Total Help needed moving from lying on your back to sitting on the side of a flat bed without using bedrails?: Total Help needed moving to and from a bed to a chair (including a wheelchair)?: Total Help needed standing up from a chair using your arms (e.g., wheelchair or bedside chair)?: Total Help needed to walk in hospital room?: Total Help needed climbing 3-5 steps with a railing? : Total 6 Click Score: 6    End of Session Equipment Utilized During Treatment: Gait belt Activity Tolerance: Patient tolerated treatment well Patient left: in bed;with call bell/phone within reach;with bed alarm set Nurse Communication: Mobility status PT Visit Diagnosis: Unsteadiness on feet (R26.81);Difficulty in walking, not elsewhere classified (R26.2);Other symptoms and signs involving the nervous system (R29.898);Hemiplegia and hemiparesis Hemiplegia - Right/Left: Left Hemiplegia - dominant/non-dominant: Non-dominant Hemiplegia - caused by: Cerebral infarction     Time: 8563-8497 PT Time Calculation (min) (ACUTE ONLY): 26 min  Charges:    $Therapeutic Activity: 23-37 mins PT General Charges $$ ACUTE PT VISIT: 1 Visit                     [name], SPT    [name] 03/24/2024, 4:15 PM

## 2024-03-24 NOTE — Progress Notes (Signed)
 Inpatient Rehab Admissions Coordinator:    CIR following. Currently has not demonstrated ability to tolerate the intensity of CIR, but will see how she does with PT/OT today.   Leita Kleine, MS, CCC-SLP Rehab Admissions Coordinator  [phone] (celll) [phone] (office)

## 2024-03-24 NOTE — Progress Notes (Signed)
 STROKE TEAM PROGRESS NOTE   SUBJECTIVE (INTERVAL HISTORY) Her sitter is at the bedside.  Patient awake and interactive but remains confused, delirious and with intermittent agitation, trying to get out of bed.  Speech is clear but a lot of distractibility and poor insight into her condition, not cooperative with exam.  No definite witnessed seizure activity. OBJECTIVE Temp:  [97.7 F (36.5 C)-98.5 F (36.9 C)] 97.7 F (36.5 C) (11/17 1149) Pulse Rate:  [73-86] 74 (11/17 1149) Cardiac Rhythm: Atrial fibrillation (11/17 0701) Resp:  [18-20] 20 (11/17 1149) BP: (145-169)/(70-89) 151/78 (11/17 1149) SpO2:  [89 %-100 %] 100 % (11/17 1149) Weight:  [81.7 kg] 81.7 kg (11/17 0500)  Recent Labs  Lab 03/23/24 2017 03/23/24 2355 03/24/24 0335 03/24/24 0756 03/24/24 1433  GLUCAP 117* 142* 200* 188* 169*   Recent Labs  Lab 03/18/24 1446 03/18/24 1453 03/20/24 0446 03/20/24 0951 03/21/24 0500 03/21/24 0900 03/21/24 1850 03/22/24 0527 03/23/24 0330 03/24/24 0410  NA 140   < > 130*   < > 131* 132*  --  128* 131* 136  K 3.8   < > 3.3*   < > 4.3 4.0  --  3.7 3.5 3.8  CL 106   < > 91*   < > 94* 94*  --  94* 98 104  CO2 20*   < > 22   < > 22 25  --  23 23 23   GLUCOSE 149*   < > 133*   < > 179* 141*  --  169* 161* 201*  BUN 18   < > 19   < > 17 21  --  34* 38* 32*  CREATININE 1.15*   < > 1.08*   < > 1.32* 1.66*  --  2.32* 2.20* 1.68*  CALCIUM  9.4   < > 8.6*   < > 8.4* 8.6*  --  8.3* 8.4* 8.2*  MG 1.7  --  1.4*  --   --   --   --  2.2 2.2 2.2  PHOS  --   --   --   --   --   --  3.7 4.3 3.4 3.0   < > = values in this interval not displayed.   Recent Labs  Lab 03/19/24 0006 03/19/24 0215 03/20/24 1218 03/21/24 0500  AST 23 28 40 25  ALT 14 15 21 14   ALKPHOS 103 104 115 105  BILITOT 0.9 1.2 1.2 0.9  PROT 7.5 7.7 8.1 6.9  ALBUMIN 3.6 3.6 3.3* 2.9*   Recent Labs  Lab 03/18/24 1446 03/18/24 1453 03/19/24 1529 03/20/24 0446 03/21/24 0500 03/22/24 0527 03/23/24 0330  03/24/24 0410  WBC 4.8   < > 6.6 9.8 8.5 7.3 8.1 6.5  NEUTROABS 3.1  --  5.4  --  6.8  --   --   --   HGB 14.0   < > 14.6 15.3* 15.5* 14.1 13.6 12.2  HCT 43.8   < > 42.9 45.1 45.9 42.0 39.7 36.3  MCV 88.3   < > 84.4 83.7 83.2 84.5 83.9 84.8  PLT 188   < > 195 225 254 200 216 200   < > = values in this interval not displayed.   Recent Labs  Lab 03/19/24 1529 03/21/24 0500  CKTOTAL 320* 148   No results for input(s): LABPROT, INR in the last 72 hours. No results for input(s): COLORURINE, LABSPEC, PHURINE, GLUCOSEU, HGBUR, BILIRUBINUR, KETONESUR, PROTEINUR, UROBILINOGEN, NITRITE, LEUKOCYTESUR in the last 72 hours.  Invalid input(s):  APPERANCEUR     Component Value Date/Time   CHOL 155 03/22/2024 0527   CHOL 201 (H) 03/01/2023 1151   TRIG 108 03/22/2024 0527   HDL 46 03/22/2024 0527   HDL 108 03/01/2023 1151   CHOLHDL 3.4 03/22/2024 0527   VLDL 22 03/22/2024 0527   LDLCALC 87 03/22/2024 0527   LDLCALC 83 03/01/2023 1151   Lab Results  Component Value Date   HGBA1C 7.7 (H) 03/23/2024      Component Value Date/Time   LABOPIA NONE DETECTED 03/19/2024 0006   COCAINSCRNUR NONE DETECTED 03/19/2024 0006   LABBENZ POSITIVE (A) 03/19/2024 0006   AMPHETMU NONE DETECTED 03/19/2024 0006   THCU NONE DETECTED 03/19/2024 0006   LABBARB NONE DETECTED 03/19/2024 0006    Recent Labs  Lab 03/19/24 1042  ETH <15    I have personally reviewed the radiological images below and agree with the radiology interpretations.  VAS US  CAROTID (at [facility] and [facility] only) Result Date: 03/24/2024 Carotid Arterial Duplex Study Patient Name:  NATALEAH SCIONEAUX  Date of Exam:   03/24/2024 Medical Rec #: [id number]      Accession #:    [id number] Date of Birth: [birth year]     Patient Gender: F Patient Age:   73 years Exam Location:  [facility] Procedure:      VAS US  CAROTID Referring Phys: EARLE DE LA TORRE  --------------------------------------------------------------------------------  Indications:       CVA. Risk Factors:      Hypertension, hyperlipidemia, Diabetes. Limitations        Today's exam was limited due to the patient's inability or                    unwillingness to cooperate, the patient's respiratory                    variation and patient constant talking, patient movement,                    patient positioning. Comparison Study:  No prior studies. Performing Technologist: Cordella Collet RVT  Examination Guidelines: A complete evaluation includes B-mode imaging, spectral Doppler, color Doppler, and power Doppler as needed of all accessible portions of each vessel. Bilateral testing is considered an integral part of a complete examination. Limited examinations for reoccurring indications may be performed as noted.  Right Carotid Findings: +----------+-------+--------+--------+---------------------+-------------------+           PSV    EDV cm/sStenosisPlaque Description   Comments                      cm/s                                                            +----------+-------+--------+--------+---------------------+-------------------+ CCA Prox  42     8               smooth and  heterogenous                             +----------+-------+--------+--------+---------------------+-------------------+ CCA Distal29     5               smooth and                                                                heterogenous                             +----------+-------+--------+--------+---------------------+-------------------+ ICA Prox  21     0                                    High resistant flow +----------+-------+--------+--------+---------------------+-------------------+ ICA Mid   26     5                                    High resistant flow  +----------+-------+--------+--------+---------------------+-------------------+ ICA Distal12     0                                    Highl resistant                                                           flow                +----------+-------+--------+--------+---------------------+-------------------+ ECA       90     16                                                       +----------+-------+--------+--------+---------------------+-------------------+ +----------+--------+-------+--------+-------------------+           PSV cm/sEDV cmsDescribeArm Pressure (mmHG) +----------+--------+-------+--------+-------------------+ [id number]                                         +----------+--------+-------+--------+-------------------+ +---------+--------+--+--------+-+---------+ VertebralPSV cm/s37EDV cm/s9Antegrade +---------+--------+--+--------+-+---------+  Left Carotid Findings: +----------+--------+--------+--------+-----------------------+--------+           PSV cm/sEDV cm/sStenosisPlaque Description     Comments +----------+--------+--------+--------+-----------------------+--------+ CCA Prox  64      15              smooth and heterogenous         +----------+--------+--------+--------+-----------------------+--------+ CCA Distal57      17              smooth and heterogenous         +----------+--------+--------+--------+-----------------------+--------+ ICA Prox  48      20  smooth and heterogenous         +----------+--------+--------+--------+-----------------------+--------+ ICA Mid   54      22                                              +----------+--------+--------+--------+-----------------------+--------+ ICA Distal59      21                                     tortuous +----------+--------+--------+--------+-----------------------+--------+ ECA       64      9                                                +----------+--------+--------+--------+-----------------------+--------+ +----------+--------+--------+--------+-------------------+           PSV cm/sEDV cm/sDescribeArm Pressure (mmHG) +----------+--------+--------+--------+-------------------+ Subclavian109                                         +----------+--------+--------+--------+-------------------+ +---------+--------+--+--------+--+---------+ VertebralPSV cm/s51EDV cm/s10Antegrade +---------+--------+--+--------+--+---------+   Summary: Right Carotid: High resistant flow detected in the ICA suggests distal                stenosis/occlusion. Left Carotid: Velocities in the left ICA are consistent with a 1-39% stenosis. Vertebrals: Bilateral vertebral arteries demonstrate antegrade flow. *See table(s) above for measurements and observations.  Electronically signed by Eather Popp MD on 03/24/2024 at 1:26:55 PM.    Final    VAS US  LOWER EXTREMITY VENOUS (DVT) Result Date: 03/24/2024  Lower Venous DVT Study Patient Name:  GIANNINA BARTOLOME  Date of Exam:   03/23/2024 Medical Rec #: [id number]      Accession #:    [id number] Date of Birth: May 27, 1950     Patient Gender: F Patient Age:   71 years Exam Location:  [facility] Procedure:      VAS US  LOWER EXTREMITY VENOUS (DVT) Referring Phys: JEFFREY HATCHER --------------------------------------------------------------------------------  Other Indications: Submassive pulmonary embolus and acute right MCA infarct and                    petechial hemorrhage. Risk Factors: Cancer History of bone cancer Atrial fibrillation, on apixaban . Limitations: Confused, delirious and agitated, trying to get out of bed. Comparison Study: No prior bilateral LEV on file Performing Technologist: [name] RVS  Examination Guidelines: A complete evaluation includes B-mode imaging, spectral Doppler, color Doppler, and power Doppler as needed of all accessible portions of each vessel.  Bilateral testing is considered an integral part of a complete examination. Limited examinations for reoccurring indications may be performed as noted. The reflux portion of the exam is performed with the patient in reverse Trendelenburg.  +---------+---------------+---------+-----------+----------+-------------------+ RIGHT    CompressibilityPhasicitySpontaneityPropertiesThrombus Aging      +---------+---------------+---------+-----------+----------+-------------------+ CFV      Full                                                             +---------+---------------+---------+-----------+----------+-------------------+  SFJ      Full                                                             +---------+---------------+---------+-----------+----------+-------------------+ FV Prox  Full                                                             +---------+---------------+---------+-----------+----------+-------------------+ FV Mid                                                Could not                                                                 adequately evaluate +---------+---------------+---------+-----------+----------+-------------------+ FV Distal                                             Could not                                                                 adequately evaluate +---------+---------------+---------+-----------+----------+-------------------+ POP      Full           Yes      No                                       +---------+---------------+---------+-----------+----------+-------------------+ PTV      Full                                                             +---------+---------------+---------+-----------+----------+-------------------+ PERO     Full                                                             +---------+---------------+---------+-----------+----------+-------------------+    +---------+---------------+---------+-----------+----------+-------------------+ LEFT     CompressibilityPhasicitySpontaneityPropertiesThrombus Aging      +---------+---------------+---------+-----------+----------+-------------------+ CFV      Full           Yes  Yes                                      +---------+---------------+---------+-----------+----------+-------------------+ SFJ      Full                                                             +---------+---------------+---------+-----------+----------+-------------------+ FV Prox  Full                                                             +---------+---------------+---------+-----------+----------+-------------------+ FV Mid   Full                                                             +---------+---------------+---------+-----------+----------+-------------------+ FV Distal                                             Patent by color     +---------+---------------+---------+-----------+----------+-------------------+ POP      Full                                                             +---------+---------------+---------+-----------+----------+-------------------+ PTV                                                   Not well visualized +---------+---------------+---------+-----------+----------+-------------------+ PERO                                                  Not well visualized +---------+---------------+---------+-----------+----------+-------------------+     Summary: RIGHT: - There is no evidence of deep vein thrombosis in the lower extremity. However, portions of this examination were limited- see technologist comments above.  LEFT: - There is no evidence of deep vein thrombosis in the lower extremity. However, portions of this examination were limited- see technologist comments above.  *See table(s) above for measurements and observations. Electronically  signed by Debby Robertson on 03/24/2024 at 8:09:37 AM.    Final    Overnight EEG with video Result Date: 03/23/2024 Shelton Arlin KIDD, MD     03/24/2024  2:21 PM Patient Name: WILLY VORCE MRN: [id number] Epilepsy Attending: Arlin KIDD Shelton Referring Physician/Provider: Khaliqdina, Salman, MD Duration: 03/22/2024 1116 to 03/23/2024 1116 Patient history: 73yo F with right MCA  stroke. EEG to evaluate for seizure. Level of alertness: Awake/ lethargic, asleep AEDs during EEG study: LEV Technical aspects: This EEG study was done with scalp electrodes positioned according to the 10-20 International system of electrode placement. Electrical activity was reviewed with band pass filter of 1-70Hz , sensitivity of 7 uV/mm, display speed of 19mm/sec with a 60Hz  notched filter applied as appropriate. EEG data were recorded continuously and digitally stored.  Video monitoring was available and reviewed as appropriate. Description: No clear posterior dominant rhythm was seen. Sleep was characterized by vertex waves, sleep spindles (12 to 14 Hz), maximal frontocentral region. EEG showed continuous generalized and maximal right posterior quadrant 3 to 6 Hz theta-delta slowing. Abundant sharp waves were noted in right posterior quadrant, at times qasi-period at 0.5hz . Hyperventilation and photic stimulation were not performed.   ABNORMALITY - Sharp wave, right posterior quadrant - Continuous slow, generalized and maximal right posterior quadrant IMPRESSION: This study initially showed evidence of epileptogenicity arising from right posterior quadrant. Additionally there is cortical dysfunction arising from right posterior quadrant likely secondary to underlying structural abnormality. Lastly there is generalized cerebral dysfunction (encephalopathy). No seizures were seen throughout the recording. Priyanka MALVA Krebs   CT ANGIO HEAD W OR WO CONTRAST Result Date: 03/21/2024 EXAM: CT HEAD WITHOUT CTA HEAD WITH AND WITHOUT 03/21/2024  06:20:00 PM TECHNIQUE: CT of the head was performed without the administration of intravenous contrast followed by a CTA of the head was performed with and without the administration of intravenous contrast. Multiplanar 2D and/or 3D reformatted images are provided for review. Automated exposure control, iterative reconstruction, and/or weight based adjustment of the mA/kV was utilized to reduce the radiation dose to as low as reasonably achievable. COMPARISON: MRI head 03/21/2024 CLINICAL HISTORY: Submassive pulmonary emboli with right MCA and PCA territory infarct. FINDINGS: CT HEAD: BRAIN AND VENTRICLES: The evolving right occipital and posterior inferior temporal lobe nonhemorrhagic infarct is again noted. The acute infarct of the posterior limb right internal capsule and insular ribbon is noted. No acute intracranial hemorrhage is present. No mass effect or midline shift. No abnormal extra-axial fluid collection. There is no hydrocephalus. There is no abnormal enhancement. ORBITS: The visualized portion of the orbits demonstrate no acute abnormality. SINUSES: The visualized paranasal sinuses and mastoid air cells demonstrate no acute abnormality. SOFT TISSUES AND SKULL: Left sided NG tube is in place. No acute abnormality of the visualized skull or soft tissues. CTA HEAD: ANTERIOR CIRCULATION: Extensive atherosclerotic changes are present in the internal carotid arteries bilaterally. 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 It is reconstituted at the level of the ophthalmic artery. The ICA terminus is opacified. Marked decreased caliber opacification is present throughout the right MCA territory suggesting low perfusion. The anterior communicating artery is patent. Asymmetric attenuation of distal right AC branch vessels is noted. No aneurysm. POSTERIOR CIRCULATION: The vertebral arteries are codominant. Mild atherosclerotic changes are present within the vertebral arteries. Moderate focal  stenosis is present in the distal right P2 segment with asymmetric attenuation of distal right PCA branches including the infarct territory. No significant stenosis of the basilar artery. No aneurysm. OTHER: No dural venous sinus thrombosis on this non-dedicated study. IMPRESSION: 1. Acute non-hemorrhagic infarct in the posterior limb of the right internal capsule . 2. Evolving nonhemorrhagic infarct in the right occipital and posterior inferior temporal lobes. 3. 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 4. Marked decreased caliber opacification throughout the right MCA territory,  suggesting low perfusion. 5. Moderate focal stenosis of the distal right P2 segment with asymmetric attenuation of distal right PCA branches. 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 Proximal tibial resection with Biomet tibial replacement. New pulmonary embolism. 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 EXAM: MRI OF LOWER LEFT EXTREMITY WITHOUT AND WITH CONTRAST TECHNIQUE: Multiplanar, multisequence MR imaging of the left lower leg was performed both before and after administration of intravenous contrast. CONTRAST:  8mL GADAVIST GADOBUTROL 1 MMOL/ML IV SOLN COMPARISON:  Limited correlation made with left knee radiographs 04/22/2022 and lower leg radiographs 10/17/2006. No prior cross sectional imaging studies of the left lower leg available. FINDINGS: Technical note: Despite efforts by the technologist and patient, mild motion artifact is present on today's exam and could not be eliminated. This reduces exam sensitivity and specificity. Bones/Joint/Cartilage Both lower legs are included on the coronal images. Patient is status post bilateral total knee arthroplasty. There is a long  tibial component which extends into the distal diaphysis. In addition, there is a distal left fibular plate and screws. There is significant susceptibility artifact related to the surgical hardware. Allowing for the artifact, no acute osseous findings or obvious recurrent tumor demonstrated. There is fluid signal along the medial aspect of the proximal tibia without apparent significant enhancement, possibly indicating pes anserine bursitis. Ligaments Not relevant for exam/indication. Muscles and Tendons Generalized asymmetric muscular atrophy throughout the left lower leg. Soft tissues As above, fluid signal along the medial aspect of the proximal tibia which may reflect pes anserine bursitis. No other focal fluid collections are identified. There is mild subcutaneous edema in the left lower leg. IMPRESSION: 1. No evidence of recurrent tumor in the left lower leg. Assessment limited by artifact from the surgical hardware. If there is persistent concern of recurrent tumor, recommend radiographic correlation and consideration of CT. 2. Fluid signal along the medial aspect of the proximal tibia may reflect pes anserine bursitis. 3. Generalized asymmetric muscular atrophy throughout the left lower leg. 4. Postsurgical changes as described. Electronically Signed   By: [name] Perone M.D.   On: 03/21/2024 15:56   MR BRAIN WO CONTRAST Result Date: 03/21/2024 EXAM: MRI BRAIN WITHOUT CONTRAST 03/21/2024 02:41:50 PM TECHNIQUE: Multiplanar multisequence MRI of the head/brain was performed without the administration of intravenous contrast. COMPARISON: MRI Brain 01/21/2024, CT head 03/18/2024, CTA head/neck 01/21/2024. CLINICAL HISTORY: encephalopathy FINDINGS: BRAIN AND VENTRICLES: Redemonstration of extensive subacute infarct in the right PCA territory with underlying chronic right PCA territory infarct. New areas of cortical restricted diffusion along the right insula and mesial right temporal lobe suspicious for acute  infarct. Diminished right ICA flow void, consistent with known occlusion. No intracranial hemorrhage. No mass. No midline shift. No hydrocephalus. The sella is unremarkable. ORBITS: No acute abnormality. SINUSES AND MASTOIDS: No acute abnormality. BONES AND SOFT TISSUES: Normal marrow signal. No acute soft tissue abnormality. IMPRESSION: 1. New areas of cortical restricted diffusion along the right insula and mesial right temporal lobe, suspicious for acute MCA territory infarct. 2. Extensive subacute infarct in the right PCA territory. 3. Diminished right ICA flow void, consistent with known occlusion. Electronically signed by: Ryan Chess MD 03/21/2024 02:52 PM EST RP Workstation: [id number]   DG Abd Portable 1V Result Date: 03/21/2024 EXAM: 1 VIEW XRAY OF THE ABDOMEN 03/21/2024 02:01:00 PM COMPARISON: CT abdomen 03/20/2024. CLINICAL HISTORY: Encounter  for feeding tube placement. FINDINGS: LINES, TUBES AND DEVICES: Feeding tube in place with tip in the duodenal bulb. Left PICC line in place with tip in the lower SVC region. BOWEL: Nonobstructive bowel gas pattern. SOFT TISSUES: Cholecystectomy clips noted. Aortic atheromatous vascular calcification. No opaque urinary calculi. BONES: Thoracolumbar spondylosis with levoconvex lumbar scoliosis with rotary component. LUNGS: The left lung apex is omitted. IMPRESSION: 1. Feeding tube in place with tip in the duodenal bulb. 2. Left PICC line in place with tip in the lower SVC region. 3. Thoracolumbar spondylosis with levoconvex lumbar scoliosis with rotary component. 4. Atherosclerosis. Electronically signed by: Ryan Salvage MD 03/21/2024 02:33 PM EST RP Workstation: [id number]   CT Angio Chest Pulmonary Embolism (PE) W or WO Contrast Result Date: 03/20/2024 CLINICAL DATA:  High probability for pulmonary embolism. EXAM: CT ANGIOGRAPHY CHEST WITH CONTRAST TECHNIQUE: Multidetector CT imaging of the chest was performed using the standard protocol during  bolus administration of intravenous contrast. Multiplanar CT image reconstructions and MIPs were obtained to evaluate the vascular anatomy. RADIATION DOSE REDUCTION: This exam was performed according to the departmental dose-optimization program which includes automated exposure control, adjustment of the mA and/or kV according to patient size and/or use of iterative reconstruction technique. CONTRAST:  50mL OMNIPAQUE  IOHEXOL  350 MG/ML SOLN COMPARISON:  CT angiogram chest 02/18/2024. FINDINGS: Cardiovascular: There are right lower lobe segmental pulmonary emboli. The heart is enlarged. Main pulmonary artery is mildly enlarged. There are atherosclerotic calcifications of the aorta and coronary arteries. There is no pericardial effusion. Aorta is normal in size. Left-sided central venous catheter ends in the SVC. Mediastinum/Nodes: The thyroid  gland is markedly enlarged and heterogeneous containing numerous nodules. The largest is in the right lobe measuring 3.2 cm and appears unchanged. There are no enlarged lymph nodes. The esophagus is within normal limits. Lungs/Pleura: There are few new scattered left upper lobe pulmonary nodular densities measuring up to 5 mm. Other smaller pulmonary nodules in the right lung are stable from prior. There is dependent atelectasis bilaterally. There is no new lung consolidation, pleural effusion or pneumothorax. Upper Abdomen: No acute abnormality. Cholecystectomy clips are present. Musculoskeletal: There is severe degenerative changes of the right shoulder and postsurgical changes. Degenerative changes affect the midthoracic spine. Review of the MIP images confirms the above findings. IMPRESSION: 1. Right lower lobe segmental pulmonary emboli. Positive for acute PE with CTevidence of right heart strain (RV/LV Ratio = 1.1) consistent with at least submassive (intermediate risk) PE. The presence of right heart strain has been associated with an increased risk of morbidity and  mortality. 2. Stable cardiomegaly. 3. Stable enlarged main pulmonary artery compatible with pulmonary arterial hypertension. 4. New scattered left upper lobe pulmonary nodules measuring up to 5 mm, possibly infectious/inflammatory, but indeterminate. No follow-up needed if patient is low-risk (and has no known or suspected primary neoplasm). Non-contrast chest CT can be considered in 12 months if patient is high-risk. This recommendation follows the consensus statement: Guidelines for Management of Incidental Pulmonary Nodules Detected on CT Images: From the Fleischner Society 2017; Radiology 2017; 284:228-243. 5. Stable enlarged heterogeneous thyroid  gland with multiple nodules. Follow-up thyroid  ultrasound recommended. Aortic Atherosclerosis (ICD10-I70.0). Electronically Signed   By: Greig Pique M.D.   On: 03/20/2024 20:10   DG CHEST PORT 1 VIEW Result Date: 03/20/2024 EXAM: 1 VIEW(S) XRAY OF THE CHEST 03/20/2024 05:11:00 PM COMPARISON: 03/18/2024, 02/18/2024 CLINICAL HISTORY: S/P PICC central line placement FINDINGS: LINES, TUBES AND DEVICES: Left PICC with tip overlying the right atrium. LUNGS AND PLEURA:  Low lung volumes. No focal pulmonary opacity. No pleural effusion. No pneumothorax. HEART AND MEDIASTINUM: Aortic atherosclerosis. No acute abnormality of the cardiac and mediastinal silhouettes. BONES AND SOFT TISSUES: Right shoulder surgical screw noted. No acute osseous abnormality. IMPRESSION: 1. Left PICC with tip overlying the right atrium. 2. Low lung volumes. Otherwise, no pneumonia or pulmonary edema. No pneumothorax. Electronically signed by: Rogelia Myers MD 03/20/2024 05:28 PM EST RP Workstation: [id number]   CT Angio Abd/Pel w/ and/or w/o Result Date: 03/20/2024 EXAM: CTA ABDOMEN AND PELVIS WITHOUT AND WITH CONTRAST 03/20/2024 11:35:00 AM TECHNIQUE: CTA images of the abdomen and pelvis without and with intravenous contrast. 100 mL of iohexol  (OMNIPAQUE ) 350 MG/ML injection was  administered. Three-dimensional MIP/volume rendered formations were performed. Automated exposure control, iterative reconstruction, and/or weight based adjustment of the mA/kV was utilized to reduce the radiation dose to as low as reasonably achievable. COMPARISON: 03/18/2024. CLINICAL HISTORY: Mesenteric ischemia, acute. FINDINGS: VASCULATURE: AORTA: No acute finding. No abdominal aortic aneurysm. No dissection. CELIAC TRUNK: No acute finding. No occlusion or significant stenosis. SUPERIOR MESENTERIC ARTERY: No acute finding. No occlusion or significant stenosis. RENAL ARTERIES: No acute finding. No occlusion or significant stenosis. ILIAC ARTERIES: No acute finding. No occlusion or significant stenosis. LIVER: The liver is unremarkable. GALLBLADDER AND BILE DUCTS: Status post cholecystectomy. No biliary ductal dilatation. SPLEEN: The spleen is unremarkable. PANCREAS: The pancreas is unremarkable. ADRENAL GLANDS: Bilateral adrenal glands demonstrate no acute abnormality. KIDNEYS, URETERS AND BLADDER: Bilateral renal cortical scarring is noted. Bilateral renal cysts are noted. No stones in the kidneys or ureters. No hydronephrosis. No perinephric or periureteral stranding. Urinary bladder is unremarkable. GI AND BOWEL: Stomach and duodenal sweep demonstrate no acute abnormality. There is no bowel obstruction. No abnormal bowel wall thickening or distension. REPRODUCTIVE: Reproductive organs are unremarkable. PERITONEUM AND RETRPERITONEUM: No ascites or free air. LUNG BASE: No acute abnormality. LYMPH NODES: No lymphadenopathy. BONES AND SOFT TISSUES: No acute abnormality of the bones. No acute soft tissue abnormality. IMPRESSION: 1. No acute findings. Electronically signed by: Lynwood Seip MD 03/20/2024 12:14 PM EST RP Workstation: [id number]   ECHOCARDIOGRAM COMPLETE Result Date: 03/19/2024    ECHOCARDIOGRAM REPORT   Patient Name:   ADAISHA CAMPISE Date of Exam: 03/19/2024 Medical Rec #:  [id number]     Height:        63.0 in Accession #:    [id number]    Weight:       178.0 lb Date of Birth:  [birth year]    BSA:          1.840 m Patient Age:    73 years      BP:           132/90 mmHg Patient Gender: F             HR:           95 bpm. Exam Location:  Inpatient Procedure: 2D Echo (Both Spectral and Color Flow Doppler were utilized during            procedure). STAT ECHO Indications:    acute diastolic chf  History:        Patient has prior history of Echocardiogram examinations, most                 recent 01/21/2024. Chronic kidney disease, Arrythmias:Atrial                 Fibrillation; Risk Factors:Hypertension, Diabetes and  Dyslipidemia.  Sonographer:    Tinnie Barefoot RDCS Referring Phys: [id number] JEFFREY C HATCHER IMPRESSIONS  1. Left ventricular ejection fraction, by estimation, is 50 to 55%. The left ventricle has low normal function. The left ventricle has no regional wall motion abnormalities. Left ventricular diastolic function could not be evaluated.  2. Right ventricular systolic function is mildly reduced. The right ventricular size is normal. There is severely elevated pulmonary artery systolic pressure.  3. Left atrial size was moderately dilated.  4. The mitral valve is degenerative. Mild mitral valve regurgitation. No evidence of mitral stenosis.  5. The aortic valve is normal in structure. Aortic valve regurgitation is not visualized. No aortic stenosis is present.  6. The inferior vena cava is normal in size with greater than 50% respiratory variability, suggesting right atrial pressure of 3 mmHg. FINDINGS  Left Ventricle: Left ventricular ejection fraction, by estimation, is 50 to 55%. The left ventricle has low normal function. The left ventricle has no regional wall motion abnormalities. The left ventricular internal cavity size was normal in size. There is no left ventricular hypertrophy. Left ventricular diastolic function could not be evaluated. Right Ventricle: The right ventricular  size is normal. No increase in right ventricular wall thickness. Right ventricular systolic function is mildly reduced. There is severely elevated pulmonary artery systolic pressure. The tricuspid regurgitant velocity is 3.49 m/s, and with an assumed right atrial pressure of 15 mmHg, the estimated right ventricular systolic pressure is 63.7 mmHg. Left Atrium: Left atrial size was moderately dilated. Right Atrium: Right atrial size was normal in size. Pericardium: There is no evidence of pericardial effusion. Mitral Valve: The mitral valve is degenerative in appearance. Mild mitral annular calcification. Mild mitral valve regurgitation. No evidence of mitral valve stenosis. Tricuspid Valve: The tricuspid valve is normal in structure. Tricuspid valve regurgitation is mild . No evidence of tricuspid stenosis. Aortic Valve: The aortic valve is normal in structure. Aortic valve regurgitation is not visualized. No aortic stenosis is present. Pulmonic Valve: The pulmonic valve was normal in structure. Pulmonic valve regurgitation is mild. No evidence of pulmonic stenosis. Aorta: The aortic root is normal in size and structure. Venous: The inferior vena cava is normal in size with greater than 50% respiratory variability, suggesting right atrial pressure of 3 mmHg. IAS/Shunts: The atrial septum is grossly normal.  LEFT VENTRICLE PLAX 2D LVIDd:         4.90 cm LVIDs:         3.30 cm LV PW:         1.00 cm LV IVS:        0.90 cm LVOT diam:     2.10 cm LV SV:         42 LV SV Index:   23 LVOT Area:     3.46 cm  LEFT ATRIUM           Index        RIGHT ATRIUM           Index LA diam:      4.00 cm 2.17 cm/m   RA Area:     13.30 cm LA Vol (A4C): 61.7 ml 33.53 ml/m  RA Volume:   30.00 ml  16.30 ml/m  AORTIC VALVE LVOT Vmax:   62.60 cm/s LVOT Vmean:  40.900 cm/s LVOT VTI:    0.120 m  AORTA Ao Root diam: 2.70 cm Ao Asc diam:  3.10 cm TRICUSPID VALVE TR Peak grad:   48.7 mmHg TR Vmax:  349.00 cm/s  SHUNTS Systemic VTI:   0.12 m Systemic Diam: 2.10 cm Morene Brownie Electronically signed by Morene Brownie Signature Date/Time: 03/19/2024/6:12:10 PM    Final    US  EKG SITE RITE Result Date: 03/19/2024 If Site Rite image not attached, placement could not be confirmed due to current cardiac rhythm.  DG CHEST PORT 1 VIEW Result Date: 03/18/2024 EXAM: 1 VIEW(S) XRAY OF THE CHEST 03/18/2024 11:28:44 PM COMPARISON: 02/18/2024 CLINICAL HISTORY: Chest pain. FINDINGS: LUNGS AND PLEURA: Mild eventration of the hemidiaphragm. No focal pulmonary opacity. No pulmonary edema. No pleural effusion. No pneumothorax. HEART AND MEDIASTINUM: No acute abnormality of the cardiac and mediastinal silhouettes. BONES AND SOFT TISSUES: Postsurgical changes involving the right shoulder. No acute osseous abnormality. IMPRESSION: 1. No acute cardiopulmonary findings. Electronically signed by: Pinkie Pebbles MD 03/18/2024 11:34 PM EST RP Workstation: [id number]   CT ABDOMEN PELVIS W CONTRAST Result Date: 03/18/2024 CLINICAL DATA:  Acute abdominal pain EXAM: CT ABDOMEN AND PELVIS WITH CONTRAST TECHNIQUE: Multidetector CT imaging of the abdomen and pelvis was performed using the standard protocol following bolus administration of intravenous contrast. RADIATION DOSE REDUCTION: This exam was performed according to the departmental dose-optimization program which includes automated exposure control, adjustment of the mA and/or kV according to patient size and/or use of iterative reconstruction technique. CONTRAST:  75mL OMNIPAQUE  IOHEXOL  350 MG/ML SOLN COMPARISON:  02/18/2024 FINDINGS: Lower chest: No acute pleural or parenchymal lung disease. Hepatobiliary: No focal liver abnormality is seen. Status post cholecystectomy. No biliary dilatation. Pancreas: Unremarkable. No pancreatic ductal dilatation or surrounding inflammatory changes. Spleen: Normal in size without focal abnormality. Adrenals/Urinary Tract: Heterogeneous areas of decreased cortical  enhancement are seen within the dorsal aspect of the left kidney, which could reflect acute pyelonephritis. Please correlate with urinalysis. There are other scattered areas of cortical scarring within the bilateral kidneys. Small simple appearing renal cortical cysts do not require specific imaging follow-up. The adrenals and bladder are unremarkable. Stomach/Bowel: No bowel obstruction or ileus. No bowel wall thickening or inflammatory change. Small hiatal hernia. Vascular/Lymphatic: Aortic atherosclerosis. No enlarged abdominal or pelvic lymph nodes. Reproductive: Uterus and bilateral adnexa are unremarkable. Other: No free fluid or free intraperitoneal gas. No abdominal wall hernia. Musculoskeletal: No acute or destructive bony abnormalities. Severe spondylosis at the L3-4 level. Reconstructed images demonstrate no additional findings. IMPRESSION: 1. Heterogeneous areas of decreased cortical enhancement within the left kidney, suspicious for pyelonephritis. Please correlate with urinalysis. 2. Otherwise no acute intra-abdominal or intrapelvic process. 3. Small hiatal hernia. 4.  Aortic Atherosclerosis (ICD10-I70.0). Electronically Signed   By: Ozell Daring M.D.   On: 03/18/2024 18:58   CT Head Wo Contrast Result Date: 03/18/2024 CLINICAL DATA:  Altered level of consciousness EXAM: CT HEAD WITHOUT CONTRAST TECHNIQUE: Contiguous axial images were obtained from the base of the skull through the vertex without intravenous contrast. RADIATION DOSE REDUCTION: This exam was performed according to the departmental dose-optimization program which includes automated exposure control, adjustment of the mA and/or kV according to patient size and/or use of iterative reconstruction technique. COMPARISON:  03/18/2024 at 4 p.m. FINDINGS: Brain: Stable chronic right PCA territory infarct with developing encephalomalacia. No evidence of acute infarct or hemorrhage. Lateral ventricles and midline structures are stable. No  acute extra-axial fluid collections. No mass effect. Vascular: No hyperdense vessel or unexpected calcification. Skull: Normal. Negative for fracture or focal lesion. Sinuses/Orbits: No acute finding. Other: None. IMPRESSION: 1. Stable sequela of chronic right PCA territory infarct. 2. No acute intracranial process. Electronically Signed  By: Ozell Daring M.D.   On: 03/18/2024 18:54   CT Head Wo Contrast Result Date: 03/18/2024 CLINICAL DATA:  Altered level of consciousness, headache EXAM: CT HEAD WITHOUT CONTRAST TECHNIQUE: Contiguous axial images were obtained from the base of the skull through the vertex without intravenous contrast. RADIATION DOSE REDUCTION: This exam was performed according to the departmental dose-optimization program which includes automated exposure control, adjustment of the mA and/or kV according to patient size and/or use of iterative reconstruction technique. COMPARISON:  02/18/2024 FINDINGS: Examination is significantly limited by patient motion throughout the exam, despite multiple repeat imaging attempts. Brain: Continued evolution of the prior right PCA territory infarct. No evidence of hemorrhagic transformation. Stable chronic small-vessel ischemic changes within the right thalamus and periventricular white matter. No evidence of acute infarct or hemorrhage. Lateral ventricles and remaining midline structures are stable. No acute extra-axial fluid collections. No mass effect. Vascular: No hyperdense vessel or unexpected calcification. Skull: Normal. Negative for fracture or focal lesion. Sinuses/Orbits: No acute finding. Other: None. IMPRESSION: 1. Limited study due to patient motion throughout the exam. 2. Continued evolution of the chronic right PCA territory infarct noted previously. No evidence of hemorrhagic transformation. 3. Otherwise no acute intracranial process. Electronically Signed   By: Ozell Daring M.D.   On: 03/18/2024 16:14     PHYSICAL EXAM  Temp:   [97.7 F (36.5 C)-98.5 F (36.9 C)] 97.7 F (36.5 C) (11/17 1149) Pulse Rate:  [73-86] 74 (11/17 1149) Resp:  [18-20] 20 (11/17 1149) BP: (145-169)/(70-89) 151/78 (11/17 1149) SpO2:  [89 %-100 %] 100 % (11/17 1149) Weight:  [81.7 kg] 81.7 kg (11/17 0500)  General - Well nourished, well developed, in no apparent distress, mild agitation.  Ophthalmologic - fundi not visualized due to noncooperation.  Cardiovascular - irregularly irregular heart rate and rhythm  Neuro - awake, alert, eyes open, however, flat affect, confused   oriented to age but refused to answer other orientation questions.   Speech is fluent but often tangential.  Easy distractibility.  Right gaze gaze preference, tracking to the left but incomplete, not blinking to visual threat on the left but able to blink to visual threat on the right. No facial droop. Tongue protrusion not corporative. Bilateral UEs not raising up as requested. Bilaterally LEs withdraw to pain seems symmetrical. Sensation, coordination not corporative and gait not tested.   ASSESSMENT/PLAN Ms. SHARONNE RICKETTS is a 73 y.o. 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 No TNK given due to outside window and on Eliquis .    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  Subacute infarct right PCA territory. 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 Marked decreased caliber opacification throughout the right MCA territory, suggesting low perfusion. Moderate focal stenosis of the distal right P2 segment with asymmetric attenuation of distal right PCA branches. 2D Echo  EF 50 to 55%, RV function is mildly reduced Carotid Doppler after 1-39% carotid stenosis. LDL 87 HgbA1c 7.7 Heparin IV for VTE prophylaxis Eliquis  (apixaban ) daily prior to admission, now on heparin IV per stroke protocol Ongoing aggressive stroke risk factor management Therapy recommendations:  CIR Disposition: Pending  History of stroke 01/2024 admitted for right  large PCA infarct.  CT head and neck showed right ICA origin occlusion with distal reconstitution.  Right P2 occlusion.  CTP 9/42.  EF 60 to 65%.  LDL 123, A1c 6.5, UDS negative.  Discharged on Eliquis , aspirin , Crestor  and Zetia  10.  PE CTA chest 11/13 showed right lower lobe submassive PE Was on heparin IV LE venous Doppler no evidence of DVT Pan CT no malignancy MRI tibia did not show recurrent malignancy of left tibial GCT No TCD bubble study needed as current stroke not likely from paradoxical emboli  Abnormal EEG Long-term EEG showed sharp waves at right hemisphere Likely due to underlying stroke Continue long-term EEG On Keppra 500 milligrams twice daily  A-fib Was on Eliquis  PTA Now heparin IV per stroke protocol May consider switch to DOAC at discharge  Diabetes DKA HgbA1c 7.7 goal < 7.0 Currently on insulin  drip CBG monitoring SSI DM education and close PCP follow up  Hypertension BP fluctuate Avoid low BP given chronic right ICA occlusion Long term BP goal normotensive  Hyperlipidemia Home meds: Crestor  20 and Zetia  10 LDL 87, goal < 70 Now on home Crestor  20 and Zetia  10 Continue statin at discharge  Dysphagia Did not pass swallow Status post core Trak On tube feeding  Other Stroke Risk Factors Advanced age Obesity, Body mass index is 31.91 kg/m.   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  Recommend continue IV heparin till patient is  able to swallow reliably and then switch back to anticoagulation with Eliquis .  Speech therapy to swallow eval today.  Continue management of medical parameters as per primary team.  Continue tube feeds for now      Stroke team will sign off.  Kindly call for questions.     Eather Popp, MD Stroke Neurology 03/24/2024 3:09 PM    To contact Stroke Continuity provider, please refer to [email or web address]. After hours, contact General Neurology

## 2024-03-24 NOTE — Progress Notes (Addendum)
 HD#5 SUBJECTIVE:  Patient Summary: Megan Fischer is a 73 y.o. with a pertinent PMH of DM2, Afib (on eliquis ), CKD4, CVA (01/2024), and past multinodular goiter, who presented with headache and agitation who was admitted for encephalopathy. 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   Overnight Events: None.  Interim History: The patient is alert and aware that she is in the hospital and that she recently had a stroke. She is unable to state the exact date but is roughly temporally oriented, noting that Thanksgiving is approaching. She reports seeing Alm, whom she identified as her imaginary friend, and describes him as walking around the room. She also reports feeling hot (the room was warm). She is pleasant, repeatedly expresses happiness to see the team, and demonstrates a friendly demeanor with staff. She has not gotten of bed. She denies pain, shortness of breath, and any other current issues.   OBJECTIVE:  Vital Signs: Vitals:   03/23/24 2005 03/23/24 2359 03/24/24 0338 03/24/24 0500  BP: (!) 151/85 (!) 164/80 (!) 151/89   Pulse: 86 75 73   Resp:      Temp: 98.5 F (36.9 C) 98.3 F (36.8 C) 97.8 F (36.6 C)   TempSrc:  Oral Oral   SpO2: (!) 89% 100% 100%   Weight:    81.7 kg  Height:       Supplemental O2: Room Air SpO2: 100 %  Filed Weights   03/22/24 0500 03/23/24 0617 03/24/24 0500  Weight: 82 kg 81.4 kg 81.7 kg    Intake/Output Summary (Last 24 hours) at 03/24/2024 9341 Last data filed at 03/24/2024 0600 Gross per 24 hour  Intake 872.75 ml  Output 750 ml  Net 122.75 ml   Net IO Since Admission: 527.4 mL [03/24/24 0658]  Physical Exam: Physical Exam Constitutional:      General: She is not in acute distress.    Appearance: She is ill-appearing.  HENT:     Head: Normocephalic and atraumatic.     Mouth/Throat:     Mouth: Mucous  membranes are moist.  Eyes:     Extraocular Movements: Extraocular movements intact.  Cardiovascular:     Rate and Rhythm: Normal rate. Rhythm irregular.     Heart sounds: No murmur heard.    No friction rub. No gallop.  Pulmonary:     Effort: Pulmonary effort is normal.     Breath sounds: Normal breath sounds.  Abdominal:     General: There is no distension.     Palpations: Abdomen is soft.     Tenderness: There is no abdominal tenderness.  Musculoskeletal:     Comments: Dependent edema both legs  Skin:    General: Skin is warm and dry.  Neurological:     Mental Status: She is alert.  Psychiatric:     Comments: Alert and attentive. Speech fluent. Oriented to person and place; not fully oriented to date. Exhibits visual hallucinations but remains calm and appropriate.     Patient Lines/Drains/Airways Status     Active Line/Drains/Airways     Name Placement date Placement time Site Days   Peripheral IV 03/22/24 20 G 1.88 Anterior;Right;Upper Arm 03/22/24  1152  Arm  2   PICC Double Lumen 03/20/24 Left Brachial 42 cm 0 cm 03/20/24  1631  -- 4   External Urinary Catheter 03/19/24  1600  --  5   Small  Bore Feeding Tube 10 Fr. Left nare Marking at nare/corner of mouth 63 cm 03/21/24  1331  Left nare  3            Pertinent labs and imaging:      Latest Ref Rng & Units 03/24/2024    4:10 AM 03/23/2024    3:30 AM 03/22/2024    5:27 AM  CBC  WBC 4.0 - 10.5 K/uL 6.5  8.1  7.3   Hemoglobin 12.0 - 15.0 g/dL 87.7  86.3  85.8   Hematocrit 36.0 - 46.0 % 36.3  39.7  42.0   Platelets 150 - 400 K/uL 200  216  200        Latest Ref Rng & Units 03/24/2024    4:10 AM 03/23/2024    3:30 AM 03/22/2024    5:27 AM  CMP  Glucose 70 - 99 mg/dL 798  838  830   BUN 8 - 23 mg/dL 32  38  34   Creatinine 0.44 - 1.00 mg/dL 8.31  7.79  7.67   Sodium 135 - 145 mmol/L 136  131  128   Potassium 3.5 - 5.1 mmol/L 3.8  3.5  3.7   Chloride 98 - 111 mmol/L 104  98  94   CO2 22 - 32 mmol/L  23  23  23    Calcium  8.9 - 10.3 mg/dL 8.2  8.4  8.3    Heparin Studies  Latest Reference Range & Units 03/24/24 04:10  Heparin Unfractionated 0.30 - 0.70 IU/mL 0.36   aPTT  Latest Reference Range & Units 03/24/24 04:10  APTT 24 - 36 seconds 77 (H)  (H): Data is abnormally high  Capillary Glucose  Latest Reference Range & Units 03/24/24 03:35  Glucose-Capillary 70 - 99 mg/dL 799 (H)  (H): Data is abnormally high  VAS US  LOWER EXTREMITY VENOUS (DVT) Result Date: 03/23/2024  Lower Venous DVT Study Patient Name:  Megan Fischer  Date of Exam:   03/23/2024 Medical Rec #: [id number]      Accession #:    [id number] Date of Birth: [birth year]     Patient Gender: F Patient Age:   53 years Exam Location:  [facility] Procedure:      VAS US  LOWER EXTREMITY VENOUS (DVT) Referring Phys: JEFFREY HATCHER --------------------------------------------------------------------------------  Other Indications: Submassive pulmonary embolus and acute right MCA infarct and                    petechial hemorrhage. Risk Factors: Cancer History of bone cancer Atrial fibrillation, on apixaban . Limitations: Confused, delirious and agitated, trying to get out of bed. Comparison Study: No prior bilateral LEV on file Performing Technologist: [name] RVS  Examination Guidelines: A complete evaluation includes B-mode imaging, spectral Doppler, color Doppler, and power Doppler as needed of all accessible portions of each vessel. Bilateral testing is considered an integral part of a complete examination. Limited examinations for reoccurring indications may be performed as noted. The reflux portion of the exam is performed with the patient in reverse Trendelenburg.  +---------+---------------+---------+-----------+----------+-------------------+ RIGHT    CompressibilityPhasicitySpontaneityPropertiesThrombus Aging      +---------+---------------+---------+-----------+----------+-------------------+ CFV      Full                                                              +---------+---------------+---------+-----------+----------+-------------------+  SFJ      Full                                                             +---------+---------------+---------+-----------+----------+-------------------+ FV Prox  Full                                                             +---------+---------------+---------+-----------+----------+-------------------+ FV Mid                                                Could not                                                                 adequately evaluate +---------+---------------+---------+-----------+----------+-------------------+ FV Distal                                             Could not                                                                 adequately evaluate +---------+---------------+---------+-----------+----------+-------------------+ POP      Full           Yes      No                                       +---------+---------------+---------+-----------+----------+-------------------+ PTV      Full                                                             +---------+---------------+---------+-----------+----------+-------------------+ PERO     Full                                                             +---------+---------------+---------+-----------+----------+-------------------+   +---------+---------------+---------+-----------+----------+-------------------+ LEFT     CompressibilityPhasicitySpontaneityPropertiesThrombus Aging      +---------+---------------+---------+-----------+----------+-------------------+ CFV      Full           Yes  Yes                                      +---------+---------------+---------+-----------+----------+-------------------+ SFJ      Full                                                              +---------+---------------+---------+-----------+----------+-------------------+ FV Prox  Full                                                             +---------+---------------+---------+-----------+----------+-------------------+ FV Mid   Full                                                             +---------+---------------+---------+-----------+----------+-------------------+ FV Distal                                             Patent by color     +---------+---------------+---------+-----------+----------+-------------------+ POP      Full                                                             +---------+---------------+---------+-----------+----------+-------------------+ PTV                                                   Not well visualized +---------+---------------+---------+-----------+----------+-------------------+ PERO                                                  Not well visualized +---------+---------------+---------+-----------+----------+-------------------+     Summary: RIGHT: - There is no evidence of deep vein thrombosis in the lower extremity. However, portions of this examination were limited- see technologist comments above.  LEFT: - There is no evidence of deep vein thrombosis in the lower extremity. However, portions of this examination were limited- see technologist comments above.  *See table(s) above for measurements and observations.    Preliminary    Overnight EEG with video Result Date: 03/23/2024 Shelton Arlin KIDD, MD     03/23/2024  8:10 AM Patient Name: Megan Fischer MRN: [id number] Epilepsy Attending: Arlin KIDD Shelton Referring Physician/Provider: Khaliqdina, Salman, MD Duration: 03/22/2024 1116 to 03/23/2024 0800 Patient history: 73yo F with right MCA stroke. EEG to evaluate for seizure. Level of alertness: Awake/  lethargic, asleep AEDs during EEG study: LEV Technical aspects: This EEG study was done with scalp  electrodes positioned according to the 10-20 International system of electrode placement. Electrical activity was reviewed with band pass filter of 1-70Hz , sensitivity of 7 uV/mm, display speed of 65mm/sec with a 60Hz  notched filter applied as appropriate. EEG data were recorded continuously and digitally stored.  Video monitoring was available and reviewed as appropriate. Description: No clear posterior dominant rhythm was seen. Sleep was characterized by vertex waves, sleep spindles (12 to 14 Hz), maximal frontocentral region. EEG showed continuous generalized and maximal right posterior quadrant 3 to 6 Hz theta-delta slowing. Abundant sharp waves were noted in right posterior quadrant, at times qasi-period at 0.5hz . Hyperventilation and photic stimulation were not performed.   ABNORMALITY - Sharp wave, right posterior quadrant - Continuous slow, generalized and maximal right posterior quadrant IMPRESSION: This study initially showed evidence of epileptogenicity arising from right posterior quadrant. Additionally there is cortical dysfunction arising from right posterior quadrant likely secondary to underlying structural abnormality. Lastly there is generalized cerebral dysfunction (encephalopathy). No seizures were seen throughout the recording. Arlin MALVA Krebs    ASSESSMENT/PLAN:  Assessment: Principal Problem:   Acute ischemic right MCA stroke (HCC) Active Problems:   Atrial fibrillation (HCC)   Hypertension   DM (diabetes mellitus) type II controlled with renal manifestation (HCC)   CKD (chronic kidney disease) stage 3, GFR 30-59 ml/min (HCC)   Abdominal pain, epigastric   Hemiplegia and hemiparesis following cerebral infarction affecting left non-dominant side (HCC)   Headache   Vomiting   Hyperthyroidism   Delirium   High anion gap metabolic acidosis   Lactic acidosis   Euglycemic DKA (diabetic ketoacidosis) (HCC)   Pulmonary artery hypertension (HCC)   Right ventricular dysfunction    History of recent stroke   Pulmonary embolism (HCC)   Hypercoagulable state   AKI (acute kidney injury)   Malnutrition of moderate degree   H/O ischemic right PCA stroke   Abnormal EEG  Plan: #Encephalopathy 2/2 acute R MCA ischemic stroke #H/o R PCA stroke (09/20225) MRI of brain revealed an acute R MCA stroke with petechial hemorrhage of the prior R PCA stroke (09/20225). Neurology evaluation suggests an ischemic stroke. - Continue ongoing aggressive stroke management  - Continue heparin IV for VTE stroke prophylaxis, per stroke protocol - Carotid doppler is pending to guide management and is planned for completion today  #Abnormal EEG EEG shows right posterior quadrant sharp waves consistent with epileptogenic potential. There is continuous generalized slowing, maximal in the right posterior quadrant, indicating cortical dysfunction likely related to an underlying structural abnormality. Findings also suggest generalized cerebral dysfunction. No electrographic seizures were captured during the study.  - Continue Keppra 500 mg BID  #PE, Acute  Patient has stable PE previously off anticoagulation due to petechial hemorrhage in an old right MCA infarct. Neurology now approves restarting anticoagulation, and the patient is back on heparin per stroke protocol. Lower extremity doppler ultrasound shows no DVT. Stroke team does not recommend a TCD bubble study as the infract is unlikely due to a paradoxical embolism. Uncertain whether PE reflects inadequate anticoagulation due to Apixaban  being held versus reduced efficacy of therapy, will continue to evaluate.  - Continue IV heparin per stroke protocol  - No TCD bubble study   #AKI Baseline creatinine is about ~1.0/ Recent elevation likely related to contrast-associated kidney injury following multiple imaging studies. Creatinine has improved from 2.20 (yesterday) to 1.68 (today), trending down toward baseline. Remaining electrolyte  profile is  consistent with underlying CKD stage 4.  - Continue supportive care as renal function improves - Monitor renal function daily  - Control BP appropriately  - Start free water 250 mL q6h to address possible volume depletion and support renal recovery  #T2DM S/p treatment of euglycemic DKA and persistent lactic acidosis. Capillary blood glucose levels have been elevated today, with the most recent readings of 200 and 188. - Increase Semglee to 12 units to improve basal glycemic control  - Continue Moderate SSI - Continue to monitor blood glucose levels   #Hyperthyroidism: - Continue Methimazole  10 mg daily and Cholestyramine 4 g QID   Best Practice: Diet: NPO, Coretrak IVF: N/A VTE: SCDs Start: 03/21/24 1923 Code: DNR/DNI  Disposition planning: Therapy Recs: CIR DISPO: Anticipated discharge in few days to Skilled nursing facility pending clinical improvement.  Signature:  Dwane GORMAN Glatter, MS3 [facility]  6:58 AM, 03/24/2024  On Call pager [phone]  -- The above was reviewed by me, Dr. Penne Mori; DO - PGY1.  The patient was independently evaluated by me and changes to the above note were made where appropriate.

## 2024-03-24 NOTE — Procedures (Signed)
 Patient Name: KYOKO ELSEA  MRN: [id number]  Epilepsy Attending: Arlin MALVA Krebs  Referring Physician/Provider: Khaliqdina, Salman, MD  Duration: 03/23/2024 1116 to 03/23/2024 1747   Patient history: 73yo F with right MCA stroke. EEG to evaluate for seizure.   Level of alertness: Awake/ lethargic, asleep   AEDs during EEG study: LEV   Technical aspects: This EEG study was done with scalp electrodes positioned according to the 10-20 International system of electrode placement. Electrical activity was reviewed with band pass filter of 1-70Hz , sensitivity of 7 uV/mm, display speed of 73mm/sec with a 60Hz  notched filter applied as appropriate. EEG data were recorded continuously and digitally stored.  Video monitoring was available and reviewed as appropriate.   Description: No clear posterior dominant rhythm was seen. Sleep was characterized by vertex waves, sleep spindles (12 to 14 Hz), maximal frontocentral region. EEG showed continuous generalized and maximal right posterior quadrant 3 to 6 Hz theta-delta slowing. Abundant sharp waves were noted in right posterior quadrant, at times qasi-period at 0.5hz . Hyperventilation and photic stimulation were not performed.      ABNORMALITY - Sharp wave, right posterior quadrant - Continuous slow, generalized and maximal right posterior quadrant   IMPRESSION: This study initially showed evidence of epileptogenicity arising from right posterior quadrant. Additionally there is cortical dysfunction arising from right posterior quadrant likely secondary to underlying structural abnormality. Lastly there is generalized cerebral dysfunction (encephalopathy). No seizures were seen throughout the recording.   Zoua Caporaso O Yomaira Solar

## 2024-03-24 NOTE — Progress Notes (Signed)
 PHARMACY - ANTICOAGULATION  Pharmacy Consult for heparin  Indication: submassive PE  and afib   Allergies  Allergen Reactions   Aspirin  Nausea Only    Patient stated to neuro that it caused an upset stomach   Codeine Other (See Comments)    Unknown    Fentanyl  Other (See Comments)    hypoxia   Oxycodone Hcl Other (See Comments)    Unknown    Penicillins Other (See Comments)    Unknown    Chlorhexidine Gluconate Rash    Patient Measurements: Height: 5' 3 (160 cm) Weight: 81.7 kg (180 lb 1.9 oz) IBW/kg (Calculated) : 52.4 HEPARIN DW (KG): 70.1  Vital Signs: Temp: 97.8 F (36.6 C) (11/17 0338) Temp Source: Oral (11/17 0338) BP: 151/89 (11/17 0338) Pulse Rate: 73 (11/17 0338)  Labs: Recent Labs    03/22/24 0527 03/22/24 0527 03/22/24 2320 03/23/24 0330 03/23/24 1015 03/23/24 1800 03/24/24 0410  HGB 14.1  --   --  13.6  --   --  12.2  HCT 42.0  --   --  39.7  --   --  36.3  PLT 200  --   --  216  --   --  200  APTT  --    < > 136*  --  22* 67* 77*  HEPARINUNFRC  --   --  0.96*  --   --   --  0.36  CREATININE 2.32*  --   --  2.20*  --   --  1.68*   < > = values in this interval not displayed.    Estimated Creatinine Clearance: 30.2 mL/min (A) (by C-G formula based on SCr of 1.68 mg/dL (H)).  Assessment: 73 y.o. female admitted with CVA, found to have RLL submassive PE with RHS. Patient was on apixaban  PTA for Afib, last dose 11/10. Pharmacy consulted for heparin.     Heparin level 0.36 and aptt 77 are correlating, both are therapeutic on 750 units/hr. Follow heparin level from now.     Goal of Therapy:  Heparin level 0.3-0.5 units/ml aPTT 66-85 seconds Monitor platelets by anticoagulation protocol: Yes   Plan:  Increase heparin slightly to 800 units/hr given submassive PE to target higher end of goal  F/u 8hr heparin level Monitor daily heparin level, CBC, signs/symptoms of bleeding   Jinnie Door, PharmD, BCPS, [facility] Clinical Pharmacist  Please check  AMION for all [facility] Pharmacy phone numbers After 10:00 PM, call Main Pharmacy [phone]

## 2024-03-24 NOTE — Progress Notes (Signed)
 PHARMACY - ANTICOAGULATION  Pharmacy Consult for heparin  Indication: submassive PE  and afib   Allergies  Allergen Reactions   Aspirin  Nausea Only    Patient stated to neuro that it caused an upset stomach   Codeine Other (See Comments)    Unknown    Fentanyl  Other (See Comments)    hypoxia   Oxycodone Hcl Other (See Comments)    Unknown    Penicillins Other (See Comments)    Unknown    Chlorhexidine Gluconate Rash    Patient Measurements: Height: 5' 3 (160 cm) Weight: 81.7 kg (180 lb 1.9 oz) IBW/kg (Calculated) : 52.4 HEPARIN DW (KG): 70.1  Vital Signs: Temp: 98 F (36.7 C) (11/17 1851) Temp Source: Axillary (11/17 1851) BP: 169/88 (11/17 1851) Pulse Rate: 83 (11/17 1851)  Labs: Recent Labs    03/22/24 0527 03/22/24 0527 03/22/24 2320 03/23/24 0330 03/23/24 1015 03/23/24 1800 03/24/24 0410 03/24/24 1830  HGB 14.1  --   --  13.6  --   --  12.2  --   HCT 42.0  --   --  39.7  --   --  36.3  --   PLT 200  --   --  216  --   --  200  --   APTT  --    < > 136*  --  22* 67* 77*  --   HEPARINUNFRC  --   --  0.96*  --   --   --  0.36 0.56  CREATININE 2.32*  --   --  2.20*  --   --  1.68*  --    < > = values in this interval not displayed.    Estimated Creatinine Clearance: 30.2 mL/min (A) (by C-G formula based on SCr of 1.68 mg/dL (H)).  Assessment: 73 y.o. female admitted with CVA, found to have RLL submassive PE with RHS. Patient was on apixaban  PTA for Afib, last dose 11/10. Pharmacy consulted for heparin.     Heparin level 0.36 and aptt 77 are correlating, both are therapeutic on 750 units/hr. Follow heparin level from now.   PM Update: Heparin level slightly supra-therapeutic at 0.56 on 800 units/hr. Will reduce back to 750 units/hr.     Goal of Therapy:  Heparin level 0.3-0.5 units/ml aPTT 66-85 seconds Monitor platelets by anticoagulation protocol: Yes   Plan:  Reduce heparin to 750 units/hr given submassive PE to target higher end of goal  F/u 8hr  heparin level Monitor daily heparin level, CBC, signs/symptoms of bleeding   Harlene Boga, PharmD, BCPS, BCCCP Clinical Pharmacist Please check AMION for all [facility] Pharmacy phone numbers After 10:00 PM, call Main Pharmacy [phone]

## 2024-03-24 NOTE — Progress Notes (Addendum)
 This chaplain responded to PMT NP-Dawn consult for Pt. support and prayer. Chaplain's visit is at the time members of the medical team are meeting at the bedside. Revisit is planned.  **1255 The Pt. did not respond to her name. This chaplain will attempt a revisit.  **1410 The chaplain introduced herself and connected the visit with the family's request for spiritual care. The chaplain understands the Pt. prefers intercessory prayer today. This chaplain will attempt a revisit at another time.  Chaplain Leeroy Hummer [phone]

## 2024-03-24 NOTE — Progress Notes (Signed)
 Carotid artery duplex has been completed. Preliminary results can be found in CV Proc through chart review.   03/24/24 11:43 AM Cathlyn Collet RVT

## 2024-03-24 NOTE — TOC Progression Note (Signed)
 Transition of Care [facility]) - Progression Note    Patient Details  Name: Megan Fischer MRN: [id number] Date of Birth: [birth year]  [facility] CM/SW Contact  [name] JULIANNA George, RN Phone Number: 03/24/2024, 10:35 AM  Clinical Narrative:     Pt continues with cortrak and heparin gtt.  Awaiting CIR eval IP Care management following.  Expected Discharge Plan: IP Rehab Facility Barriers to Discharge: Continued Medical Work up               Expected Discharge Plan and Services       Living arrangements for the past 2 months: Apartment                                       Social Drivers of Health (SDOH) Interventions SDOH Screenings   Food Insecurity: Patient Unable To Answer (03/21/2024)  Housing: Unknown (03/21/2024)  Transportation Needs: Patient Unable To Answer (03/21/2024)  Utilities: Patient Unable To Answer (03/21/2024)  Alcohol Screen: Low Risk  (03/01/2023)  Depression (PHQ2-9): Low Risk  (02/28/2024)  Financial Resource Strain: Low Risk  (10/19/2023)   Received from [facility]  Physical Activity: Insufficiently Active (03/01/2023)  Social Connections: Patient Unable To Answer (03/21/2024)  Stress: No Stress Concern Present (03/01/2023)  Tobacco Use: Low Risk  (02/28/2024)  Health Literacy: Adequate Health Literacy (03/01/2023)    Readmission Risk Interventions     No data to display

## 2024-03-24 NOTE — Progress Notes (Signed)
 Daily Progress Note   Patient Name: Megan Fischer       Date: 03/24/2024 DOB: 1951/01/23  Age: 73 y.o. MRN#: [id number] Attending Physician: Eben Reyes BROCKS, MD Primary Care Physician: Bernadine Manos, MD Admit Date: 03/18/2024  Reason for Consultation/Follow-up: Establishing goals of care   Length of Stay: 5  Current Medications: Scheduled Meds:   acetaminophen  (TYLENOL ) oral liquid 160 mg/5 mL  1,000 mg Per Tube Q6H   aspirin   81 mg Per Tube Daily   atorvastatin  40 mg Per Tube Daily   cholestyramine  4 g Per Tube QID   ezetimibe   10 mg Per Tube Daily   famotidine   20 mg Per Tube Daily   feeding supplement (PROSource TF20)  60 mL Per Tube Daily   free water  250 mL Per Tube Q6H   insulin  aspart  0-15 Units Subcutaneous TID WC   insulin  glargine-yfgn  12 Units Subcutaneous QHS   levETIRAcetam  500 mg Per Tube BID   methimazole   10 mg Per Tube Daily   metoprolol  tartrate  25 mg Per Tube BID   senna  2 tablet Per Tube Daily   sodium chloride  flush  10-40 mL Intracatheter Q12H   thiamine  100 mg Per Tube Daily    Continuous Infusions:  feeding supplement (OSMOLITE 1.2 CAL) 50 mL/hr at 03/24/24 0700   heparin 800 Units/hr (03/24/24 1027)    PRN Meds: hydrOXYzine, LORazepam , ondansetron (ZOFRAN) IV, polyethylene glycol, sodium chloride  flush  Physical Exam Vitals reviewed.  Constitutional:      General: Megan Fischer is awake. Megan Fischer is not in acute distress.    Comments: Mitts and cortrak  HENT:     Head: Normocephalic and atraumatic.  Cardiovascular:     Rate and Rhythm: Normal rate.  Pulmonary:     Effort: Pulmonary effort is normal.  Skin:    General: Skin is warm and dry.  Neurological:     Comments: Oriented to person and place (hospital)             Vital Signs:  BP (!) 151/78 (BP Location: Right Arm)   Pulse 74   Temp 97.7 F (36.5 C) (Axillary)   Resp 20   Ht 5' 3 (1.6 m)   Wt 81.7 kg   SpO2 100%   BMI 31.91 kg/m  SpO2: SpO2: 100 % O2 Device: O2 Device: Room Air O2 Flow Rate:     Patient Active Problem List   Diagnosis Date Noted   Abnormal EEG 03/23/2024   H/O ischemic right PCA stroke 03/22/2024   Pulmonary embolism (HCC) 03/21/2024   Hypercoagulable state 03/21/2024   AKI (acute kidney injury) 03/21/2024   Malnutrition of moderate degree 03/21/2024   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   Right ventricular dysfunction 03/20/2024   History of recent stroke 03/20/2024   Hyperthyroidism 03/19/2024   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   Delirium 03/19/2024   High anion gap metabolic acidosis 03/19/2024   Lactic acidosis 03/19/2024   Euglycemic DKA (diabetic ketoacidosis) (HCC) 03/19/2024   Hyperlipemia 03/18/2024   Thyroid  storm 03/18/2024   Encephalopathy 03/18/2024   Abdominal pain, epigastric 03/18/2024   Hemiplegia and hemiparesis following cerebral infarction affecting left non-dominant side (HCC) 03/18/2024   Headache 03/18/2024   Vomiting 03/18/2024   Left shoulder pain 02/18/2024   CKD (chronic kidney disease) stage 3, GFR 30-59 ml/min (HCC) 01/31/2024   Stroke (HCC) 01/21/2024   Acute ischemic right MCA stroke (HCC) 01/21/2024   Cellulitis of left finger 09/19/2023   Chest pain 09/19/2023   Leg pain 03/02/2023   Acute respiratory failure with hypoxia (HCC) 04/11/2021   Acute on chronic diastolic CHF (congestive heart failure) (HCC) 04/11/2021   Hypercholesterolemia 09/17/2018   Iron deficiency anemia 06/24/2017   Anemia of chronic disease 07/20/2016   Healthcare maintenance 06/09/2013   Multinodular goiter 01/20/2013   Chronic disease anemia 03/29/2011   Obesities, morbid (HCC)    Hypertension    Dyslipidemia    DM (diabetes mellitus) type II controlled with renal manifestation (HCC)    Atrial fibrillation  (HCC) 08/04/2010    Palliative Care Assessment & Plan   Patient Profile: 73 y.o. female  with past medical history of DM2, Afib (on eliquis ), CKD4, CVA (01/2024), and past multinodular goiter admitted on 03/18/2024 with headache and agitation.   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   Today's Discussion: Reviewed chart. Patient lying in bed with eyes closed but opens them when I entered the room. I removed her right mitt at her request while we spoke. Megan Fischer is oriented to self and understands Megan Fischer is in the hospital (Megan Fischer thinks it is [facility]). Megan Fischer repeatedly asked me if we get out of the hospital and go adventuring and celebrate Thanksgiving. Megan Fischer tells me about her children and asks if they are coming to celebrate Thanksgiving.  I shared that I had been speaking with her children regarding her care.  Left voicemail with callback information for patient's son Mohammed.  PMT will continue to support.  Recommendations/Plan: DNR/DNI Treat the treatable allowing time for outcomes Spiritual care for support Continued PMT support    Code Status:    Code Status Orders  (From admission, onward)           Start     Ordered   03/19/24 0936  Do not attempt resuscitation (DNR)- Limited -Do Not Intubate (DNI)  Continuous       Question Answer Comment  If pulseless and not breathing No CPR or chest compressions.   In Pre-Arrest Conditions (Patient Is Breathing and Has A Pulse) Do not intubate. Provide all appropriate non-invasive medical interventions. Avoid ICU transfer unless indicated or required.   Consent: Discussion documented in EHR or advanced directives reviewed  03/19/24 0939        Extensive chart review has been completed prior to seeing the patient including labs, vital signs, imaging, progress/consult notes, orders, medications, and available  advance directive documents.   Care plan was discussed with bedside sitter  Time spent: 25 minutes  Thank you for allowing the Palliative Medicine Team to assist in the care of this patient.    Stephane CHRISTELLA Palin, NP  Please contact Palliative Medicine Team phone at [phone] for questions and concerns.

## 2024-03-24 NOTE — Plan of Care (Signed)
 Mitts in use. Trying to get to lines with mitts on. Tube feeding patent.  Heparin drip in use.     Problem: Education: Goal: Knowledge of General Education information will improve Description: Including pain rating scale, medication(s)/side effects and non-pharmacologic comfort measures Outcome: Progressing   Problem: Activity: Goal: Risk for activity intolerance will decrease Outcome: Progressing   Problem: Pain Managment: Goal: General experience of comfort will improve and/or be controlled Outcome: Progressing   Problem: Safety: Goal: Ability to remain free from injury will improve Outcome: Progressing   Problem: Skin Integrity: Goal: Risk for impaired skin integrity will decrease Outcome: Progressing

## 2024-03-25 DIAGNOSIS — Z7901 Long term (current) use of anticoagulants: Secondary | ICD-10-CM | POA: Diagnosis not present

## 2024-03-25 DIAGNOSIS — N179 Acute kidney failure, unspecified: Secondary | ICD-10-CM | POA: Diagnosis not present

## 2024-03-25 DIAGNOSIS — I2699 Other pulmonary embolism without acute cor pulmonale: Secondary | ICD-10-CM | POA: Diagnosis not present

## 2024-03-25 DIAGNOSIS — I63511 Cerebral infarction due to unspecified occlusion or stenosis of right middle cerebral artery: Secondary | ICD-10-CM | POA: Diagnosis not present

## 2024-03-25 DIAGNOSIS — Z794 Long term (current) use of insulin: Secondary | ICD-10-CM

## 2024-03-25 DIAGNOSIS — E119 Type 2 diabetes mellitus without complications: Secondary | ICD-10-CM

## 2024-03-25 LAB — CBC
HCT: 36.9 % (ref 36.0–46.0)
Hemoglobin: 12.5 g/dL (ref 12.0–15.0)
MCH: 28.8 pg (ref 26.0–34.0)
MCHC: 33.9 g/dL (ref 30.0–36.0)
MCV: 85 fL (ref 80.0–100.0)
Platelets: 204 K/uL (ref 150–400)
RBC: 4.34 MIL/uL (ref 3.87–5.11)
RDW: 15.2 % (ref 11.5–15.5)
WBC: 6 K/uL (ref 4.0–10.5)
nRBC: 0 % (ref 0.0–0.2)

## 2024-03-25 LAB — BASIC METABOLIC PANEL WITH GFR
Anion gap: 10 (ref 5–15)
BUN: 28 mg/dL — ABNORMAL HIGH (ref 8–23)
CO2: 21 mmol/L — ABNORMAL LOW (ref 22–32)
Calcium: 8.3 mg/dL — ABNORMAL LOW (ref 8.9–10.3)
Chloride: 102 mmol/L (ref 98–111)
Creatinine, Ser: 1.39 mg/dL — ABNORMAL HIGH (ref 0.44–1.00)
GFR, Estimated: 40 mL/min — ABNORMAL LOW (ref >60)
Glucose, Bld: 214 mg/dL — ABNORMAL HIGH (ref 70–99)
Potassium: 4.1 mmol/L (ref 3.5–5.1)
Sodium: 133 mmol/L — ABNORMAL LOW (ref 135–145)

## 2024-03-25 LAB — HEPARIN LEVEL (UNFRACTIONATED): Heparin Unfractionated: 0.36 [IU]/mL (ref 0.30–0.70)

## 2024-03-25 LAB — GLUCOSE, CAPILLARY
Glucose-Capillary: 206 mg/dL — ABNORMAL HIGH (ref 70–99)
Glucose-Capillary: 189 mg/dL — ABNORMAL HIGH (ref 70–99)
Glucose-Capillary: 216 mg/dL — ABNORMAL HIGH (ref 70–99)
Glucose-Capillary: 240 mg/dL — ABNORMAL HIGH (ref 70–99)
Glucose-Capillary: 154 mg/dL — ABNORMAL HIGH (ref 70–99)

## 2024-03-25 MED ORDER — FREE WATER
120.0000 mL | Status: DC
  Administered 2024-03-25 – 2024-03-26 (×5): 120 mL

## 2024-03-25 MED ORDER — OSMOLITE 1.2 CAL PO LIQD
1000.0000 mL | ORAL | Status: DC
  Administered 2024-03-25 – 2024-03-26 (×2): 1000 mL

## 2024-03-25 MED ORDER — MENTHOL 3 MG MT LOZG
1.0000 | LOZENGE | OROMUCOSAL | Status: DC | PRN
  Administered 2024-03-25: 3 mg via ORAL
  Filled 2024-03-25: qty 9

## 2024-03-25 MED ORDER — ACETAMINOPHEN 160 MG/5ML PO SOLN: 1000.0000 mg | Freq: Four times a day (QID) | ORAL | Status: DC | PRN

## 2024-03-25 NOTE — TOC CAGE-AID Note (Signed)
 Transition of Care [facility]) - CAGE-AID Screening   Patient Details  Name: MAXCINE STRONG MRN: [id number] Date of Birth: [birth year]  Transition of Care [facility]) CM/SW Contact:    Almarie CHRISTELLA Goodie, LCSW Phone Number: 03/25/2024, 2:16 PM   Clinical Narrative:   Patient unable to participate in CAGE-AID, not fully oriented.     CAGE-AID Screening: Substance Abuse Screening unable to be completed due to: : Patient unable to participate             Substance Abuse Education Offered: No

## 2024-03-25 NOTE — Progress Notes (Signed)
 HD#6 SUBJECTIVE:  Patient Summary: Megan Fischer is a 73 y.o. 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   Overnight Events: None.   Interim History: The patient reports feeling great today because she is surrounded by great people. She believes she is currently in [facility] but states she is in [place], [place] when asked to identify what city she is in. She also reports that Alm, her imaginary friend, is present in the room today. She requests cough drops for a sore throat. She denies headache, chest pain, or shortness of breath.   OBJECTIVE:  Vital Signs: Vitals:   03/24/24 2101 03/25/24 0035 03/25/24 0400 03/25/24 0500  BP: (!) 148/76 (!) 182/88 (!) 171/98   Pulse: 81 85 75   Resp: 19     Temp: 98.4 F (36.9 C) 98.2 F (36.8 C) 98.2 F (36.8 C)   TempSrc: Axillary Axillary Axillary   SpO2: 98% 98% 98%   Weight:    81.8 kg  Height:       Supplemental O2: Room Air SpO2: 98 %  Filed Weights   03/23/24 0617 03/24/24 0500 03/25/24 0500  Weight: 81.4 kg 81.7 kg 81.8 kg    Intake/Output Summary (Last 24 hours) at 03/25/2024 9292 Last data filed at 03/25/2024 0549 Gross per 24 hour  Intake 1215.34 ml  Output --  Net 1215.34 ml   Net IO Since Admission: 1,792.74 mL [03/25/24 0707]  Physical Exam: Physical Exam Constitutional:      General: She is not in acute distress.    Appearance: She is ill-appearing.  HENT:     Head: Normocephalic and atraumatic.     Mouth/Throat:     Mouth: Mucous membranes are moist.  Cardiovascular:     Rate and Rhythm: Normal rate. Rhythm irregular.     Heart sounds:  No murmur heard.    No friction rub. No gallop.  Pulmonary:     Effort: Pulmonary effort is normal. No respiratory distress.     Breath sounds: Normal breath sounds.  Abdominal:     General: Bowel sounds are normal.     Palpations: Abdomen is soft.  Skin:    General: Skin is warm and dry.  Neurological:     Mental Status: She is alert.  Psychiatric:     Comments: Alert and attentive. Speech fluent. Oriented to person, not fully oriented to place or date. Exhibits visual hallucinations but remains calm and appropriate.    Patient Lines/Drains/Airways Status     Active Line/Drains/Airways     Name Placement date Placement time Site Days   Peripheral IV 03/22/24 20 G 1.88 Anterior;Right;Upper Arm 03/22/24  1152  Arm  3   PICC Double Lumen 03/20/24 Left Brachial 42 cm 0 cm 03/20/24  1631  -- 5   External Urinary Catheter 03/19/24  1600  --  6   Small Bore Feeding Tube 10 Fr. Left nare Marking at nare/corner of mouth 63 cm 03/21/24  1331  Left nare  4           Pertinent labs and imaging:      Latest Ref Rng & Units 03/25/2024  5:40 AM 03/24/2024    4:10 AM 03/23/2024    3:30 AM  CBC  WBC 4.0 - 10.5 K/uL 6.0  6.5  8.1   Hemoglobin 12.0 - 15.0 g/dL 87.4  87.7  86.3   Hematocrit 36.0 - 46.0 % 36.9  36.3  39.7   Platelets 150 - 400 K/uL 204  200  216       Latest Ref Rng & Units 03/25/2024    5:40 AM 03/24/2024    4:10 AM 03/23/2024    3:30 AM  CMP  Glucose 70 - 99 mg/dL 785  798  838   BUN 8 - 23 mg/dL 28  32  38   Creatinine 0.44 - 1.00 mg/dL 8.60  8.31  7.79   Sodium 135 - 145 mmol/L 133  136  131   Potassium 3.5 - 5.1 mmol/L 4.1  3.8  3.5   Chloride 98 - 111 mmol/L 102  104  98   CO2 22 - 32 mmol/L 21  23  23    Calcium  8.9 - 10.3 mg/dL 8.3  8.2  8.4    Heparin Studies  Latest Reference Range & Units 03/25/24 05:40  Heparin Unfractionated 0.30 - 0.70 IU/mL 0.36   Capillary Glucose  Latest Reference Range & Units 03/25/24 03:54 03/25/24 07:40   Glucose-Capillary 70 - 99 mg/dL 793 (H) 810 (H)  (H): Data is abnormally high  VAS US  CAROTID (at [facility] and [facility] only) Result Date: 03/24/2024 Carotid Arterial Duplex Study Patient Name:  Megan Fischer  Date of Exam:   03/24/2024 Medical Rec #: [id number]      Accession #:    [id number] Date of Birth: 06/01/1950     Patient Gender: F Patient Age:   74 years Exam Location:  [facility] Procedure:      VAS US  CAROTID Referring Phys: EARLE DE LA TORRE --------------------------------------------------------------------------------  Indications:       CVA. Risk Factors:      Hypertension, hyperlipidemia, Diabetes. Limitations        Today's exam was limited due to the patient's inability or                    unwillingness to cooperate, the patient's respiratory                    variation and patient constant talking, patient movement,                    patient positioning. Comparison Study:  No prior studies. Performing Technologist: Cordella Collet RVT  Examination Guidelines: A complete evaluation includes B-mode imaging, spectral Doppler, color Doppler, and power Doppler as needed of all accessible portions of each vessel. Bilateral testing is considered an integral part of a complete examination. Limited examinations for reoccurring indications may be performed as noted.  Right Carotid Findings: +----------+-------+--------+--------+---------------------+-------------------+           PSV    EDV cm/sStenosisPlaque Description   Comments                      cm/s                                                            +----------+-------+--------+--------+---------------------+-------------------+ CCA Prox  42     8               smooth and                                                                heterogenous                             +----------+-------+--------+--------+---------------------+-------------------+ CCA Distal29     5               smooth and                                                                 heterogenous                             +----------+-------+--------+--------+---------------------+-------------------+ ICA Prox  21     0                                    High resistant flow +----------+-------+--------+--------+---------------------+-------------------+ ICA Mid   26     5                                    High resistant flow +----------+-------+--------+--------+---------------------+-------------------+ ICA Distal12     0                                    Highl resistant                                                           flow                +----------+-------+--------+--------+---------------------+-------------------+ ECA       90     16                                                       +----------+-------+--------+--------+---------------------+-------------------+ +----------+--------+-------+--------+-------------------+           PSV cm/sEDV cmsDescribeArm Pressure (mmHG) +----------+--------+-------+--------+-------------------+ [id number]                                         +----------+--------+-------+--------+-------------------+ +---------+--------+--+--------+-+---------+ VertebralPSV cm/s37EDV cm/s9Antegrade +---------+--------+--+--------+-+---------+  Left Carotid Findings: +----------+--------+--------+--------+-----------------------+--------+           PSV cm/sEDV cm/sStenosisPlaque Description  Comments +----------+--------+--------+--------+-----------------------+--------+ CCA Prox  64      15              smooth and heterogenous         +----------+--------+--------+--------+-----------------------+--------+ CCA Distal57      17              smooth and heterogenous         +----------+--------+--------+--------+-----------------------+--------+ ICA Prox  48      20              smooth and heterogenous          +----------+--------+--------+--------+-----------------------+--------+ ICA Mid   54      22                                              +----------+--------+--------+--------+-----------------------+--------+ ICA Distal59      21                                     tortuous +----------+--------+--------+--------+-----------------------+--------+ ECA       64      9                                               +----------+--------+--------+--------+-----------------------+--------+ +----------+--------+--------+--------+-------------------+           PSV cm/sEDV cm/sDescribeArm Pressure (mmHG) +----------+--------+--------+--------+-------------------+ Subclavian109                                         +----------+--------+--------+--------+-------------------+ +---------+--------+--+--------+--+---------+ VertebralPSV cm/s51EDV cm/s10Antegrade +---------+--------+--+--------+--+---------+   Summary: Right Carotid: High resistant flow detected in the ICA suggests distal                stenosis/occlusion. Left Carotid: Velocities in the left ICA are consistent with a 1-39% stenosis. Vertebrals: Bilateral vertebral arteries demonstrate antegrade flow. *See table(s) above for measurements and observations.  Electronically signed by Eather Popp MD on 03/24/2024 at 1:26:55 PM.    Final    ASSESSMENT/PLAN:  Assessment: Principal Problem:   Acute ischemic right MCA stroke (HCC) Active Problems:   Atrial fibrillation (HCC)   Hypertension   DM (diabetes mellitus) type II controlled with renal manifestation (HCC)   CKD (chronic kidney disease) stage 3, GFR 30-59 ml/min (HCC)   Abdominal pain, epigastric   Hemiplegia and hemiparesis following cerebral infarction affecting left non-dominant side (HCC)   Headache   Vomiting   Hyperthyroidism   Delirium   High anion gap metabolic acidosis   Lactic acidosis   Euglycemic DKA (diabetic ketoacidosis) (HCC)    Pulmonary artery hypertension (HCC)   Right ventricular dysfunction   History of recent stroke   Pulmonary embolism (HCC)   Hypercoagulable state   AKI (acute kidney injury)   Malnutrition of moderate degree   H/O ischemic right PCA stroke   Abnormal EEG  Plan: #Encephalopathy 2/2 acute R MCA ischemic stroke #H/o R PCA stroke (09/20225) MRI of brain revealed an acute R MCA stroke with petechial hemorrhage of the prior R PCA stroke (09/20225). Neurology evaluation suggests an ischemic stroke. Carotid doppler report indicated that there  was 1) high resistant flow detected in the R ICA suggesting distal stenosis/occlusion 2) velocities in the left ICA are consistent with a 1-39% stenosis and 3) bilateral vertebral arteries demonstrate antegrade flow. No new actionable findings, and the results do not change current management. Neurology has reviewed and is signing off.  - Continue ongoing aggressive stroke management  - Continue heparin IV for VTE stroke prophylaxis, per stroke protocol - CTA + Doppler consistent with known right MCA occlusion with distal reconstitution  - CIR is following and has concerns that the patient will not able to tolerate the required intensity  - PT notes that the patient is not progressing towards goals   #Abnormal EEG EEG shows right posterior quadrant sharp waves consistent with epileptogenic potential. There is continuous generalized slowing, maximal in the right posterior quadrant, indicating cortical dysfunction likely related to an underlying structural abnormality. Findings also suggest generalized cerebral dysfunction. No electrographic seizures were captured during the study.  - Continue Keppra 500 mg BID  #PE, Acute  Patient has stable PE. Will continue heparin per stroke protocol. Lower extremity doppler ultrasound shows no DVT. PE unlikely due to anticoagulation failure. - Continue IV heparin per stroke protocol    #AKI Baseline creatinine is about  ~1.0. Recent elevation likely related to contrast-associated kidney injury following multiple imaging studies. Creatinine has improved from 1.68 (yesterday) to 1.39 (today), trending down toward baseline. Remaining electrolyte profile is consistent with underlying CKD stage 4.  - Continue supportive care as renal function improves - Monitor renal function daily  - Control BP appropriately  - Continue free water 250 mL q6h    #T2DM S/p treatment of euglycemic DKA and persistent lactic acidosis. Capillary blood glucose levels have been elevated today, with the most recent readings of 189 and 206. - Continue Semglee 12 units to for basal glycemic control  - Continue Moderate SSI - Continue to monitor blood glucose levels    #Hyperthyroidism: - Continue Methimazole  10 mg daily  - Continue Cholestyramine 4 g QID   Best Practice: Diet: NPO, Coretrak IVF: Fluids: N/A VTE: SCDs Start: 03/21/24 1923 Code: DNR/DNI  Disposition planning: Therapy Recs: CIR,  DISPO: Anticipated discharge in a few days to Skilled nursing facility pending clinical improvement.  Signature:  Dwane GORMAN [facility]  7:07 AM, 03/25/2024  On Call pager [phone]

## 2024-03-25 NOTE — Progress Notes (Signed)
 HD#6 SUBJECTIVE:  Patient Summary: Megan Fischer is a 73 y.o. 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   Overnight Events: None.   Interim History:  11/18 There were no acute events overnight. Vital signs have been stable, and morning labs are non-concerning. Cr 1.39 from 1.68. Pt was evaluated to likely not be a great CIR candidate.  The patient reports feeling great today because she is surrounded by great people. She believes she is currently in [facility] but states she is in [place], [place] when asked to identify what city she is in. She also reports that Alm, her imaginary friend, is present in the room today. She requests cough drops for a sore throat. She denies headache, chest pain, or shortness of breath.   OBJECTIVE:  Vital Signs: Vitals:   03/25/24 0035 03/25/24 0400 03/25/24 0500 03/25/24 0723  BP: (!) 182/88 (!) 171/98  (!) 166/93  Pulse: 85 75  (!) 56  Resp:    16  Temp: 98.2 F (36.8 C) 98.2 F (36.8 C)  97.6 F (36.4 C)  TempSrc: Axillary Axillary  Oral  SpO2: 98% 98%  100%  Weight:   81.8 kg   Height:       Supplemental O2: Room Air SpO2: 100 %  Filed Weights   03/23/24 0617 03/24/24 0500 03/25/24 0500  Weight: 81.4 kg 81.7 kg 81.8 kg    Intake/Output Summary (Last 24 hours) at 03/25/2024 1056 Last data filed at 03/25/2024 0700 Gross per 24 hour  Intake 2415.34 ml  Output --  Net 2415.34 ml   Net IO Since Admission: 2,992.74 mL [03/25/24 1056]  Physical Exam: Physical Exam Constitutional:      General: She is not in acute distress.    Appearance: She is ill-appearing.   HENT:     Head: Normocephalic and atraumatic.     Mouth/Throat:     Mouth: Mucous membranes are moist.  Cardiovascular:     Rate and Rhythm: Normal rate. Rhythm irregular.     Heart sounds: No murmur heard.    No friction rub. No gallop.  Pulmonary:     Effort: Pulmonary effort is normal. No respiratory distress.     Breath sounds: Normal breath sounds.  Abdominal:     General: Bowel sounds are normal.     Palpations: Abdomen is soft.  Musculoskeletal:     Right lower leg: Edema present.     Left lower leg: Edema present.  Skin:    General: Skin is warm and dry.  Neurological:     Mental Status: She is alert.  Psychiatric:     Comments: Alert and attentive. Speech fluent. Oriented to person, not fully oriented to place or date. Exhibits visual hallucinations but remains calm and appropriate.    Patient Lines/Drains/Airways Status     Active Line/Drains/Airways     Name Placement date Placement time Site Days   Peripheral IV 03/22/24 20 G 1.88 Anterior;Right;Upper Arm 03/22/24  1152  Arm  3   PICC Double Lumen 03/20/24 Left Brachial 42 cm 0 cm 03/20/24  1631  -- 5   External Urinary Catheter 03/19/24  1600  --  6   Small Bore Feeding Tube 10 Fr. Left nare Marking at nare/corner of mouth 63 cm 03/21/24  1331  Left nare  4           Pertinent labs and imaging:      Latest Ref Rng & Units 03/25/2024    5:40 AM 03/24/2024    4:10 AM 03/23/2024    3:30 AM  CBC  WBC 4.0 - 10.5 K/uL 6.0  6.5  8.1   Hemoglobin 12.0 - 15.0 g/dL 87.4  87.7  86.3   Hematocrit 36.0 - 46.0 % 36.9  36.3  39.7   Platelets 150 - 400 K/uL 204  200  216       Latest Ref Rng & Units 03/25/2024    5:40 AM 03/24/2024    4:10 AM 03/23/2024    3:30 AM  CMP  Glucose 70 - 99 mg/dL 785  798  838   BUN 8 - 23 mg/dL 28  32  38   Creatinine 0.44 - 1.00 mg/dL 8.60  8.31  7.79   Sodium 135 - 145 mmol/L 133  136  131   Potassium 3.5 - 5.1 mmol/L 4.1  3.8  3.5   Chloride 98 - 111 mmol/L 102  104  98    CO2 22 - 32 mmol/L 21  23  23    Calcium  8.9 - 10.3 mg/dL 8.3  8.2  8.4    Heparin Studies  Latest Reference Range & Units 03/25/24 05:40  Heparin Unfractionated 0.30 - 0.70 IU/mL 0.36   Capillary Glucose  Latest Reference Range & Units 03/25/24 03:54 03/25/24 07:40  Glucose-Capillary 70 - 99 mg/dL 793 (H) 810 (H)  (H): Data is abnormally high  VAS US  CAROTID (at [facility] and [facility] only) Result Date: 03/24/2024 Carotid Arterial Duplex Study Patient Name:  Megan Fischer  Date of Exam:   03/24/2024 Medical Rec #: [id number]      Accession #:    [id number] Date of Birth: [birth year]     Patient Gender: F Patient Age:   81 years Exam Location:  [facility] Procedure:      VAS US  CAROTID Referring Phys: EARLE DE LA TORRE --------------------------------------------------------------------------------  Indications:       CVA. Risk Factors:      Hypertension, hyperlipidemia, Diabetes. Limitations        Today's exam was limited due to the patient's inability or                    unwillingness to cooperate, the patient's respiratory                    variation and patient constant talking, patient movement,                    patient positioning. Comparison Study:  No prior studies. Performing Technologist: Cordella Collet RVT  Examination Guidelines: A complete evaluation includes B-mode imaging, spectral Doppler, color Doppler, and power Doppler as needed of all accessible portions of each vessel. Bilateral testing is considered an integral part of a complete examination. Limited examinations for reoccurring indications may be performed as noted.  Right Carotid Findings: +----------+-------+--------+--------+---------------------+-------------------+           PSV    EDV cm/sStenosisPlaque Description   Comments                      cm/s                                                            +----------+-------+--------+--------+---------------------+-------------------+  CCA Prox  42      8               smooth and                                                                heterogenous                             +----------+-------+--------+--------+---------------------+-------------------+ CCA Distal29     5               smooth and                                                                heterogenous                             +----------+-------+--------+--------+---------------------+-------------------+ ICA Prox  21     0                                    High resistant flow +----------+-------+--------+--------+---------------------+-------------------+ ICA Mid   26     5                                    High resistant flow +----------+-------+--------+--------+---------------------+-------------------+ ICA Distal12     0                                    Highl resistant                                                           flow                +----------+-------+--------+--------+---------------------+-------------------+ ECA       90     16                                                       +----------+-------+--------+--------+---------------------+-------------------+ +----------+--------+-------+--------+-------------------+           PSV cm/sEDV cmsDescribeArm Pressure (mmHG) +----------+--------+-------+--------+-------------------+ [id number]                                         +----------+--------+-------+--------+-------------------+ +---------+--------+--+--------+-+---------+ VertebralPSV cm/s37EDV cm/s9Antegrade +---------+--------+--+--------+-+---------+  Left Carotid Findings: +----------+--------+--------+--------+-----------------------+--------+           PSV cm/sEDV  cm/sStenosisPlaque Description     Comments +----------+--------+--------+--------+-----------------------+--------+ CCA Prox  64      15              smooth and heterogenous          +----------+--------+--------+--------+-----------------------+--------+ CCA Distal57      17              smooth and heterogenous         +----------+--------+--------+--------+-----------------------+--------+ ICA Prox  48      20              smooth and heterogenous         +----------+--------+--------+--------+-----------------------+--------+ ICA Mid   54      22                                              +----------+--------+--------+--------+-----------------------+--------+ ICA Distal59      21                                     tortuous +----------+--------+--------+--------+-----------------------+--------+ ECA       64      9                                               +----------+--------+--------+--------+-----------------------+--------+ +----------+--------+--------+--------+-------------------+           PSV cm/sEDV cm/sDescribeArm Pressure (mmHG) +----------+--------+--------+--------+-------------------+ Subclavian109                                         +----------+--------+--------+--------+-------------------+ +---------+--------+--+--------+--+---------+ VertebralPSV cm/s51EDV cm/s10Antegrade +---------+--------+--+--------+--+---------+   Summary: Right Carotid: High resistant flow detected in the ICA suggests distal                stenosis/occlusion. Left Carotid: Velocities in the left ICA are consistent with a 1-39% stenosis. Vertebrals: Bilateral vertebral arteries demonstrate antegrade flow. *See table(s) above for measurements and observations.  Electronically signed by Eather Popp MD on 03/24/2024 at 1:26:55 PM.    Final    ASSESSMENT/PLAN:  Assessment: Principal Problem:   Acute ischemic right MCA stroke (HCC) Active Problems:   Atrial fibrillation (HCC)   Hypertension   DM (diabetes mellitus) type II controlled with renal manifestation (HCC)   CKD (chronic kidney disease) stage 3, GFR 30-59 ml/min (HCC)    Abdominal pain, epigastric   Hemiplegia and hemiparesis following cerebral infarction affecting left non-dominant side (HCC)   Headache   Vomiting   Hyperthyroidism   Delirium   High anion gap metabolic acidosis   Lactic acidosis   Euglycemic DKA (diabetic ketoacidosis) (HCC)   Pulmonary artery hypertension (HCC)   Right ventricular dysfunction   History of recent stroke   Pulmonary embolism (HCC)   Hypercoagulable state   AKI (acute kidney injury)   Malnutrition of moderate degree   H/O ischemic right PCA stroke   Abnormal EEG  Plan: #Encephalopathy 2/2 acute R MCA ischemic stroke #H/o R PCA stroke (09/20225) MRI of brain revealed an acute R MCA stroke with petechial hemorrhage of the prior R PCA stroke (09/20225). Neurology evaluation suggests an ischemic stroke.  Carotid doppler report indicated that there was 1) high resistant flow detected in the R ICA suggesting distal stenosis/occlusion 2) velocities in the left ICA are consistent with a 1-39% stenosis and 3) bilateral vertebral arteries demonstrate antegrade flow. No new actionable findings, and the results do not change current management. Neurology has reviewed and is signing off.  - Continue ongoing aggressive stroke management  - Continue heparin IV for VTE stroke prophylaxis, per stroke protocol - CTA + Doppler consistent with known right MCA occlusion with distal reconstitution  - CIR is following and has concerns that the patient will not able to tolerate the required intensity  - PT notes that the patient is not progressing towards goals   #Abnormal EEG EEG shows right posterior quadrant sharp waves consistent with epileptogenic potential. There is continuous generalized slowing, maximal in the right posterior quadrant, indicating cortical dysfunction likely related to an underlying structural abnormality. Findings also suggest generalized cerebral dysfunction. No electrographic seizures were captured during the study.   - Continue Keppra 500 mg BID  #PE, Acute  Patient has stable PE. Will continue heparin per stroke protocol. Lower extremity doppler ultrasound shows no DVT. PE unlikely due to anticoagulation failure. - Continue IV heparin per stroke protocol    #AKI Baseline creatinine is about ~1.0. Recent elevation likely related to contrast-associated kidney injury following multiple imaging studies. Creatinine has improved from 1.68 (11/17) to 1.39 (11/18), trending down toward baseline. Remaining electrolyte profile is consistent with underlying CKD stage 4.  - Continue supportive care as renal function improves - Monitor renal function daily  - Control BP appropriately  - Continue free water 250 mL q6h    #T2DM S/p treatment of euglycemic DKA and persistent lactic acidosis. Capillary blood glucose levels have been elevated, with the most recent readings of 189 and 206. - Continue Semglee 12 units to for basal glycemic control - Continue Moderate SSI - Continue to monitor blood glucose levels   #Hyperthyroidism: - Continue Methimazole  10 mg daily  - Continue Cholestyramine 4 g QID   Best Practice: Diet: NPO, Coretrak IVF: Fluids: N/A VTE: SCDs Start: 03/21/24 1923 Code: DNR/DNI  Disposition planning: Therapy Recs: CIR,  DISPO: Anticipated discharge in a few days to Skilled nursing facility pending clinical improvement.  Signature:  Dwane Glatter - PGY3 [facility]  10:56 AM, 03/25/2024  On Call pager [phone]    I was personally present and re-performed the exam and medical decision making and verified the service and findings are accurately documented in the student's note.  Penne Mori, DO 03/25/2024 11:10 AM

## 2024-03-25 NOTE — Progress Notes (Signed)
 PHARMACY - ANTICOAGULATION  Pharmacy Consult for heparin  Indication: submassive PE  and afib   Allergies  Allergen Reactions   Aspirin  Nausea Only    Patient stated to neuro that it caused an upset stomach   Codeine Other (See Comments)    Unknown    Fentanyl  Other (See Comments)    hypoxia   Oxycodone Hcl Other (See Comments)    Unknown    Penicillins Other (See Comments)    Unknown    Chlorhexidine Gluconate Rash    Patient Measurements: Height: 5' 3 (160 cm) Weight: 81.8 kg (180 lb 5.4 oz) IBW/kg (Calculated) : 52.4 HEPARIN DW (KG): 70.1  Vital Signs: Temp: 97.4 F (36.3 C) (11/18 1150) Temp Source: Oral (11/18 1150) BP: 127/83 (11/18 1150) Pulse Rate: 70 (11/18 1150)  Labs: Recent Labs    03/23/24 0330 03/23/24 1015 03/23/24 1800 03/24/24 0410 03/24/24 1830 03/25/24 0540  HGB 13.6  --   --  12.2  --  12.5  HCT 39.7  --   --  36.3  --  36.9  PLT 216  --   --  200  --  204  APTT  --  22* 67* 77*  --   --   HEPARINUNFRC  --   --   --  0.36 0.56 0.36  CREATININE 2.20*  --   --  1.68*  --  1.39*    Estimated Creatinine Clearance: 36.5 mL/min (A) (by C-G formula based on SCr of 1.39 mg/dL (H)).  Assessment: 73 y.o. female admitted with CVA, found to have RLL submassive PE with RHS. Patient was on apixaban  PTA for Afib, last dose 11/10. Of note, son reported patient has been taking apixaban  one tablet once daily. Will not consider the PE an Eliquis  failure. Pharmacy consulted for heparin dosing.     Heparin level 0.36, therapeutic, on 750 units/hr. Hgb, PLT WNL. No issues with bleeding reported.     Goal of Therapy:  Heparin level 0.3-0.5 units/ml aPTT 66-85 seconds Monitor platelets by anticoagulation protocol: Yes   Plan:  Continue heparin 750 units/hr Monitor daily heparin level, CBC, signs/symptoms of bleeding  Discussing with IM team for resumption of apixaban   Izetta Carl, PharmD PGY1 Pharmacy Resident

## 2024-03-25 NOTE — PMR Pre-admission (Signed)
 PMR Admission Coordinator Pre-Admission Assessment  Patient: Megan Fischer is an 73 y.o., female MRN: [id number] DOB: [birth year] Height: 5' 3 (160 cm) Weight: 81.3 kg  Insurance Information HMO: Yes   PPO:      PCP:      IPA:      80/20:      OTHER: Medicare: [id number] PRIMARY: [employer] Dual Complete      Policy#: [id number]       Subscriber: patient CM Name:       Phone#:      Fax#: [phone] Pre-Cert#: [id number] approved after peer to peer review on 03/28/24 to 04/03/24 per Chianu     Employer: Disabled Benefits:  Phone #: [phone]     Name: Verified with UHCprovider portal 03/26/24 Eff. Date: 06/09/23     Deduct: $257 (met $257)      Out of Pocket Max: [phone] (met [phone])      Life Max: n/a CIR: $1565 copay/admission      SNF: $0 for days 1-20; $209.50 for days 21-100 Outpatient: 80%     Co-Pay: 20% Home Health: 100%      Co-Pay: none DME: 80%     Co-Pay: 20% Providers: in network  SECONDARY: Medicaid of [place]      Policy#: [id number]      Phone#:   Financial Counselor:       Phone#:   [employer] Information Summary" for patients in Inpatient Rehabilitation Facilities with attached "Privacy Act Statement-Health Care Records" was provided and verbally reviewed with: n/a  Emergency Contact Information Contact Information     Name Relation Home Work Mobile   [place] Son   [phone]   Megan Fischer Daughter   [phone]      Other Contacts     Name Relation Home Work Mobile   Fischer,Megan Other   [phone]       Current Medical History  Patient Admitting Diagnosis: R CVA  History of Present Illness: Megan Fischer is a 73 y.o. 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 CTA significant for subsegmental  PE. MRI head with R MCA CVA and petechial hemorrhage in territory of recent R PCA CVA. 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 Marked decreased caliber opacification throughout the right MCA territory, suggesting low perfusion. Moderate focal stenosis of the distal right P2 segment with asymmetric attenuation of distal right PCA branches.2D Echo EF 50 to 55%, RV function is mildly reduced. Eliquis  (apixaban ) daily prior to admission, on IV heparin  this admission. Seen by PT/OT/SLP and they recommend CIR to assist return to PLOF.   Complete NIHSS TOTAL: 3  Patient's medical record from [facility] has been reviewed by the rehabilitation admission coordinator and physician.  Past Medical History  Past Medical History:  Diagnosis Date   Atrial fibrillation (HCC)    Chronic disease anemia 03/29/2011   Diabetes mellitus    Dyslipidemia    Hypertension    Obesities, morbid (HCC)    Osteoarthritis     Has the patient had major surgery during 100 days prior to admission? No  Family History   family history includes Diabetes in her mother; Heart attack in her mother; Hypertension in her brother; Stroke in her mother.  Current  Medications  Current Facility-Administered Medications:    acetaminophen  (TYLENOL ) tablet 650 mg, 650 mg, Oral, Q6H PRN, [name], DO, 650 mg at 03/29/24 0405   apixaban  (ELIQUIS ) tablet 5 mg, 5 mg, Oral, BID, Fischer, Megan B, DO, 5 mg at 03/28/24 2204   aspirin  chewable tablet 81 mg, 81 mg, Oral, Daily, Fischer, Megan B, DO, 81 mg at 03/28/24 1100   atorvastatin  (LIPITOR) tablet 40 mg, 40 mg, Oral, Daily, Fischer, Megan B, DO, 40 mg at 03/28/24 1739   ezetimibe  (ZETIA ) tablet 10 mg, 10 mg, Oral, Daily, Fischer, Megan B, DO, 10 mg at 03/28/24 1059   famotidine  (PEPCID ) tablet 20 mg, 20 mg, Oral, Daily, Fischer, Megan B, DO, 20 mg at 03/28/24 1059   feeding supplement (BOOST / RESOURCE BREEZE)  liquid 1 Container, 1 Container, Oral, TID BM, Megan Sick, MD, 1 Container at 03/28/24 2204   hydrOXYzine  (ATARAX ) tablet 10 mg, 10 mg, Oral, TID PRN, Kandis Perkins, DO, 10 mg at 03/29/24 0405   insulin  aspart (novoLOG ) injection 0-5 Units, 0-5 Units, Subcutaneous, QHS, Fischer, Maria, MD   insulin  aspart (novoLOG ) injection 0-6 Units, 0-6 Units, Subcutaneous, TID WC, Fischer, Maria, MD   LORazepam  (ATIVAN ) tablet 1 mg, 1 mg, Oral, Once PRN, Fischer, Megan B, DO   menthol  (CEPACOL) lozenge 3 mg, 1 lozenge, Oral, PRN, Fischer, Dawson, MD, 3 mg at 03/25/24 2122   methimazole  (TAPAZOLE ) tablet 10 mg, 10 mg, Oral, Daily, Fischer, Megan B, DO, 10 mg at 03/28/24 1100   metoprolol  tartrate (LOPRESSOR ) tablet 25 mg, 25 mg, Oral, BID, Fischer, Megan B, DO, 25 mg at 03/28/24 2204   ondansetron  (ZOFRAN ) injection 4 mg, 4 mg, Intravenous, Q8H PRN, Fischer, Emily, DO, 4 mg at 03/20/24 0246   polyethylene glycol (MIRALAX  / GLYCOLAX ) packet 17 g, 17 g, Oral, Daily PRN, Fischer, Michael, MD   senna (SENOKOT) tablet 17.2 mg, 2 tablet, Oral, Daily, Fischer, Megan B, DO, 17.2 mg at 03/28/24 1059   sodium chloride  flush (NS) 0.9 % injection 10-40 mL, 10-40 mL, Intracatheter, Q12H, Megan Reyes BROCKS, MD, 10 mL at 03/28/24 2127   sodium chloride  flush (NS) 0.9 % injection 10-40 mL, 10-40 mL, Intracatheter, PRN, Megan Reyes BROCKS, MD  Patients Current Diet:  Diet Order             Diet regular Room service appropriate? No; Fluid consistency: Thin  Diet effective now                   Precautions / Restrictions Precautions Precautions: Fall, Other (comment) Precaution/Restrictions Comments: L homonymous hemianopsia, watch HR Restrictions Weight Bearing Restrictions Per Provider Order: No   Has the patient had 2 or more falls or a fall with injury in the past year? Yes  Prior Activity Level Limited Community (1-2x/wk): Independent  Prior Functional Level Self Care: Did the patient need  help bathing, dressing, using the toilet or eating? Needed some help  Indoor Mobility: Did the patient need assistance with walking from room to room (with or without device)? Needed some help  Stairs: Did the patient need assistance with internal or external stairs (with or without device)? Needed some help  Functional Cognition: Did the patient need help planning regular tasks such as shopping or remembering to take medications? Needed some help  Patient Information Are you of Hispanic, Latino/a,or Spanish origin?: A. No, not of Hispanic, Latino/a, or Spanish origin What is your race?: B. Black or African American Do you need or want an interpreter  to communicate with a doctor or health care staff?: 0. No  Patient's Response To:  Health Literacy and Transportation Is the patient able to respond to health literacy and transportation needs?: Yes Health Literacy - How often do you need to have someone help you when you read instructions, pamphlets, or other written material from your doctor or pharmacy?: Never In the past 12 months, has lack of transportation kept you from medical appointments or from getting medications?: No In the past 12 months, has lack of transportation kept you from meetings, work, or from getting things needed for daily living?: No  Journalist, Newspaper / Equipment Home Equipment: Grab bars - tub/shower, Medical Laboratory Scientific Officer - single point, Toilet riser  Prior Device Use: Indicate devices/aids used by the patient prior to current illness, exacerbation or injury? Walker  Current Functional Level Cognition  Arousal/Alertness: Awake/alert Overall Cognitive Status: Impaired/Different from baseline Orientation Level: Oriented X4 Attention: Sustained Sustained Attention: Impaired Sustained Attention Impairment: Verbal basic, Functional basic Memory: Impaired Memory Impairment: Decreased short term memory, Retrieval deficit, Decreased recall of new information Decreased Short  Term Memory: Verbal basic, Functional basic Awareness: Impaired Awareness Impairment: Anticipatory impairment Problem Solving: Impaired Problem Solving Impairment: Verbal basic, Functional basic Behaviors: Impulsive, Restless, Perseveration Safety/Judgment: Impaired Comments: Attempting to remove tubes/TF; has mitts    Extremity Assessment (includes Sensation/Coordination)  Upper Extremity Assessment: RUE deficits/detail, LUE deficits/detail (initiiates movement with BUE) RUE Deficits / Details: overall functionally, 3+/5 globally LUE Deficits / Details: overall functional, 3/5 globally  Lower Extremity Assessment: Defer to PT evaluation RLE Deficits / Details: Moves spontaneously, at least 3/5 LLE Deficits / Details: Appears slightly weaker than R, grossly 2-/5    ADLs  Overall ADL's : Needs assistance/impaired Eating/Feeding: NPO Grooming: Moderate assistance Upper Body Bathing: Moderate assistance Lower Body Bathing: Maximal assistance Upper Body Dressing : Moderate assistance Lower Body Dressing: Maximal assistance Toilet Transfer: Moderate assistance, +2 for physical assistance, +2 for safety/equipment, Maximal assistance, Stand-pivot Toilet Transfer Details (indicate cue type and reason): mod A +2 for rise. initially mod A to progress LLE, but with fatigue, max A for BLE progression to chair Functional mobility during ADLs: Moderate assistance, +2 for safety/equipment, +2 for physical assistance General ADL Comments: patient decliend self care tasks, I already did all that    Mobility  Overal bed mobility: Needs Assistance Bed Mobility: Supine to Sit Rolling: Total assist Supine to sit: Min assist, HOB elevated General bed mobility comments: able to move LE's off EOB with MinA to raise trunk    Transfers  Overall transfer level: Needs assistance Equipment used: Rolling walker (2 wheels) Transfers: Sit to/from Stand, Bed to chair/wheelchair/BSC Sit to Stand: Mod  assist, +2 physical assistance, +2 safety/equipment Bed to/from chair/wheelchair/BSC transfer type:: Step pivot Stand pivot transfers: Max assist, +2 safety/equipment, +2 physical assistance Step pivot transfers: Min assist, +2 safety/equipment General transfer comment: ModAx2 to boost-up with cueing for hand placement. Able to step-pivot with MinA to assist with RW management and +2 for safety    Ambulation / Gait / Stairs / Wheelchair Mobility  Ambulation/Gait Ambulation/Gait assistance: Min assist, +2 safety/equipment Gait Distance (Feet): 6 Feet Assistive device: Rolling walker (2 wheels) Gait Pattern/deviations: Step-through pattern, Decreased stride length, Trunk flexed General Gait Details: slow and slightly unsteady steps with heavy UE reliance. Cues for upright posture as pt tended to lean forwards. Able to step 73ft forwards and backwards with MinA for steadying assist and occasional assist to move RW Gait velocity: decr    Posture /  Balance Dynamic Sitting Balance Sitting balance - Comments: MinA/CGA with occasional posterior lean Balance Overall balance assessment: Needs assistance Sitting-balance support: Feet supported, No upper extremity supported Sitting balance-Leahy Scale: Fair Sitting balance - Comments: MinA/CGA with occasional posterior lean Postural control: Posterior lean Standing balance support: Bilateral upper extremity supported, During functional activity, Reliant on assistive device for balance Standing balance-Leahy Scale: Poor Standing balance comment: reliant on UE and external support    Special considerations/life events  Special service needs none   Previous Home Environment (from acute therapy documentation) Living Arrangements: Children  Lives With: Son, Family Available Help at Discharge: Family, Available 24 hours/day Type of Home: Apartment Home Layout: One level Home Access: Level entry Bathroom Shower/Tub: Engineer, Manufacturing Systems:  Standard Bathroom Accessibility: Yes How Accessible: Accessible via walker Home Care Services: No Additional Comments: info taken from prior admission  Discharge Living Setting Plans for Discharge Living Setting: Patient's home Type of Home at Discharge: Apartment Discharge Home Layout: One level Discharge Home Access: Level entry Discharge Bathroom Shower/Tub: Tub/shower unit Discharge Bathroom Toilet: Standard Discharge Bathroom Accessibility: Yes How Accessible: Accessible via walker Does the patient have any problems obtaining your medications?: No  Social/Family/Support Systems Patient Roles: Spouse Contact Information: son is her caregiver Anticipated Caregiver: Son, DIL, Daughter Anticipated Caregiver's Contact Information: Daughter Mylinda is best contact Ability/Limitations of Caregiver: None stated Caregiver Availability: 24/7 Discharge Plan Discussed with Primary Caregiver: Yes Is Caregiver In Agreement with Plan?: Yes  Goals Patient/Family Goal for Rehab: PT/OT/SLP min A Expected length of stay: 12-16 days Pt/Family Agrees to Admission and willing to participate: Yes Program Orientation Provided & Reviewed with Pt/Caregiver Including Roles  & Responsibilities: Yes  Decrease burden of Care through IP rehab admission: Not anticipated  Possible need for SNF placement upon discharge: Not anticipated  Patient Condition: I have reviewed medical records from [facility], spoken with CM, and patient. I met with patient at the bedside for inpatient rehabilitation assessment.  Patient will benefit from ongoing PT, OT, and SLP, can actively participate in 3 hours of therapy a day 5 days of the week, and can make measurable gains during the admission.  Patient will also benefit from the coordinated team approach during an Inpatient Acute Rehabilitation admission.  The patient will receive intensive therapy as well as Rehabilitation physician, nursing, social  worker, and care management interventions.  Due to safety, skin/wound care, disease management, medication administration, pain management, and patient education the patient requires 24 hour a day rehabilitation nursing.  The patient is currently min to mod assist with mobility and basic ADLs.  Discharge setting and therapy post discharge at skilled nursing facility is anticipated.  Patient has agreed to participate in the Acute Inpatient Rehabilitation Program and will admit tomorrow.  Preadmission Screen Completed By:  Lovett CHRISTELLA Ropes, 03/29/2024 8:40 AM ______________________________________________________________________   Discussed status with Dr. Urbano on 03/28/24 at 1400 and received approval for admission tomorrow.  Admission Coordinator:  Lovett CHRISTELLA Ropes, RN, time 1630/Date 03/28/24   Assessment/Plan: Diagnosis: RIght MCA infarct  Does the need for close, 24 hr/day Medical supervision in concert with the patient's rehab needs make it unreasonable for this patient to be served in a less intensive setting? Yes Co-Morbidities requiring supervision/potential complications: Afib, CKD 4 , Recent R PCA infarct  Due to bladder management, bowel management, safety, skin/wound care, disease management, medication administration, pain management, and patient education, does the patient require 24 hr/day rehab nursing? Yes Does the patient require coordinated care  of a physician, rehab nurse, PT, OT, and SLP to address physical and functional deficits in the context of the above medical diagnosis(es)? Yes Addressing deficits in the following areas: balance, endurance, locomotion, strength, transferring, bowel/bladder control, bathing, dressing, and toileting Can the patient actively participate in an intensive therapy program of at least 3 hrs of therapy 5 days a week? Yes The potential for patient to make measurable gains while on inpatient rehab is good Anticipated functional outcomes upon  discharge from inpatient rehab: min assist PT, min assist OT, min assist SLP Estimated rehab length of stay to reach the above functional goals is: 12-16d Anticipated discharge destination: Home 10. Overall Rehab/Functional Prognosis: good   MD Signature: Prentice CHARLENA Compton M.D. [facility] Fellow Am Acad of Phys Med and Rehab Diplomate Am Board of Electrodiagnostic Med Fellow Am Board of Interventional Pain

## 2024-03-25 NOTE — Progress Notes (Signed)
 Occupational Therapy Treatment Patient Details Name: Megan Fischer MRN: [id number] DOB: 07/27/1950 Today's Date: 03/25/2024   History of present illness 73 y/o F presenting to ED on 11/11 with worsening headache, restlessness, confusion x3 days. CTA significant for subsegmental PE. MRI head with R MCA CVA and petechial hemorrhage in territory of recent R PCA CVA.    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 Seen in collaboration with PT to optimize functional mobility. Pt oriented to self and situation this session (unclear whether she is aware this is a separate visit from prior CVA). Pt continues with L inattention, needing dense cues to attend to L side of environment at times as well as for mobility toward L. Pt with fair sitting balance EOB perseverating on back itching and wanting to eat chocolate Ice cream. Needing +2 max A for SPT to chair with max HR during transfers noted to be 151 beats per minute. RN notified. SLP in room for feeding trials at end of session. Will continue to follow. Recommending intensive multidisciplinary rehabilitation >3 hours/day to optimize safety and independence in ADL.       If plan is discharge home, recommend the following:  Two people to help with walking and/or transfers;Two people to help with bathing/dressing/bathroom;Assistance with cooking/housework;Direct supervision/assist for medications management;Direct supervision/assist for financial management;Assist for transportation;Help with stairs or ramp for entrance   Equipment Recommendations  Other (comment) (defer)    Recommendations for Other Services Rehab consult;Speech consult    Precautions / Restrictions Precautions Precautions: Fall;Other (comment) Recall of Precautions/Restrictions: Impaired Precaution/Restrictions Comments: L homonymous  hemianopsia, bilateral mitts, NGT Restrictions Weight Bearing Restrictions Per Provider Order: No       Mobility Bed Mobility Overal bed mobility: Needs Assistance Bed Mobility: Supine to Sit     Supine to sit: Mod assist     General bed mobility comments: initially attempting to get out of R side of bed needing dense cueing to bring BLE to EOB on the L.    Transfers Overall transfer level: Needs assistance Equipment used: Rolling walker (2 wheels), 2 person hand held assist Transfers: Sit to/from Stand, Bed to chair/wheelchair/BSC Sit to Stand: Mod assist, +2 physical assistance, +2 safety/equipment Stand pivot transfers: Max assist, +2 safety/equipment, +2 physical assistance         General transfer comment: pt needing mod A +2 person HHA for rise up from EOB. good self initiation of hand placement. With HHA, initially able to progress LLE toward L, but has difficulty with progression of RLE despite L knee block although no buckling noted. returned to sit; provided RW needing max A for L hand placement appropriately on L RW handle, Fatiguing as well as HR elevation to 151. returned to sitting position for rest break. opted for +2 HHA step pivot to L; due to fatigue needing up to max A to progress BLE     Balance Overall balance assessment: Needs assistance Sitting-balance support: Feet supported, No upper extremity supported Sitting balance-Leahy Scale: Fair Sitting balance - Comments: statically does not need UE support after initial steadying   Standing balance support: Bilateral upper extremity supported Standing balance-Leahy Scale: Poor Standing balance comment: +2 A                           ADL either performed or  assessed with clinical judgement   ADL Overall ADL's : Needs assistance/impaired                         Toilet Transfer: Moderate assistance;+2 for physical assistance;+2 for safety/equipment;Maximal  assistance;Stand-pivot Toilet Transfer Details (indicate cue type and reason): mod A +2 for rise. initially mod A to progress LLE, but with fatigue, max A for BLE progression to chair         Functional mobility during ADLs: Moderate assistance;+2 for safety/equipment;+2 for physical assistance      Extremity/Trunk Assessment Upper Extremity Assessment Upper Extremity Assessment: RUE deficits/detail;LUE deficits/detail (initiiates movement with BUE) RUE Deficits / Details: overall functionally, 3+/5 globally LUE Deficits / Details: overall functional, 3/5 globally   Lower Extremity Assessment Lower Extremity Assessment: Defer to PT evaluation        Vision   Additional Comments: L visual field deficit as well as decr L attention   Perception Perception Perception: Impaired Preception Impairment Details: Inattention/Neglect Perception-Other Comments: L   Praxis Praxis Praxis: Impaired Praxis Impairment Details: Motor planning   Communication Communication Communication: Impaired Factors Affecting Communication: Difficulty expressing self (delayed at times)   Cognition Arousal: Alert Behavior During Therapy: Flat affect Cognition: Cognition impaired   Orientation impairments: Time, Place Awareness: Intellectual awareness impaired, Online awareness impaired Memory impairment (select all impairments): Short-term memory, Declarative long-term memory Attention impairment (select first level of impairment): Selective attention Executive functioning impairment (select all impairments): Organization, Sequencing, Reasoning, Problem solving, Initiation OT - Cognition Comments: oriented to self and situation (stroke). follows one step commands; needeing multimodal cues for interaction with L side of her environment with cues for problem solving in light of deficits. fair awareness of current abilities/self pacing stating I am tired after second STS                  Following commands: Impaired Following commands impaired: Follows one step commands inconsistently      Cueing   Cueing Techniques: Verbal cues, Tactile cues  Exercises      Shoulder Instructions       General Comments pt HR elevation to 151 during STS transfer; however with quick recovery. however, pt notably fatigued afterward. RN notified.    Pertinent Vitals/ Pain       Pain Assessment Pain Assessment: Faces Faces Pain Scale: No hurt  Home Living   Living Arrangements: Children Available Help at Discharge: Family;Available 24 hours/day Type of Home: Apartment Home Access: Level entry     Home Layout: One level     Bathroom Shower/Tub: Chief Strategy Officer: Standard Bathroom Accessibility: Yes How Accessible: Accessible via walker        Lives With: Son;Family    Prior Functioning/Environment              Frequency  Min 2X/week        Progress Toward Goals  OT Goals(current goals can now be found in the care plan section)  Progress towards OT goals: Progressing toward goals  Acute Rehab OT Goals Patient Stated Goal: eat chocolate ice cream OT Goal Formulation: With patient Time For Goal Achievement: 04/05/24 Potential to Achieve Goals: Good ADL Goals Pt Will Perform Upper Body Dressing: with min assist;sitting Pt Will Perform Lower Body Dressing: with mod assist;sitting/lateral leans;sit to/from stand Pt Will Transfer to Toilet: with min assist;ambulating;regular height toilet Pt/caregiver will Perform Home Exercise Program: Increased ROM;Increased strength;Both right and left upper extremity;With written HEP provided;With  minimal assist Additional ADL Goal #1: Pt will perform bed mobility min A in prep for seated ADLs  Plan      Co-evaluation    PT/OT/SLP Co-Evaluation/Treatment: Yes Reason for Co-Treatment: Complexity of the patient's impairments (multi-system involvement);For patient/therapist safety;To address  functional/ADL transfers PT goals addressed during session: Mobility/safety with mobility OT goals addressed during session: Strengthening/ROM;Proper use of Adaptive equipment and DME      AM-PAC OT 6 Clicks Daily Activity     Outcome Measure   Help from another person eating meals?: A Little Help from another person taking care of personal grooming?: A Lot Help from another person toileting, which includes using toliet, bedpan, or urinal?: Total Help from another person bathing (including washing, rinsing, drying)?: A Lot Help from another person to put on and taking off regular upper body clothing?: A Lot Help from another person to put on and taking off regular lower body clothing?: Total 6 Click Score: 11    End of Session Equipment Utilized During Treatment: Gait belt;Rolling walker (2 wheels)  OT Visit Diagnosis: Other abnormalities of gait and mobility (R26.89);Muscle weakness (generalized) (M62.81);Hemiplegia and hemiparesis;Other symptoms and signs involving cognitive function;Low vision, both eyes (H54.2);Unsteadiness on feet (R26.81) Hemiplegia - Right/Left: Left Hemiplegia - dominant/non-dominant: Non-Dominant Hemiplegia - caused by: Cerebral infarction   Activity Tolerance Patient tolerated treatment well   Patient Left in bed;with call bell/phone within reach;with bed alarm set;with nursing/sitter in room   Nurse Communication Mobility status        Time: 9147-9084 OT Time Calculation (min): 23 min  Charges: OT General Charges $OT Visit: 1 Visit OT Treatments $Therapeutic Activity: 8-22 mins  Elma JONETTA Lebron FREDERICK, OTR/L [facility] Acute Rehabilitation Office: [phone]   Elma JONETTA Lebron 03/25/2024, 9:38 AM

## 2024-03-25 NOTE — Progress Notes (Signed)
 Inpatient Rehab Admissions Coordinator:    Pt. Progressed OOB with therapy today. I do think she is a candidate for CIR now. Daughter confirms that this is what they want for Pt and states that she and her brother can provide 24/7 assist at d/c.  Leita Kleine, MS, CCC-SLP Rehab Admissions Coordinator  [phone] (celll) [phone] (office)

## 2024-03-25 NOTE — Progress Notes (Addendum)
 Nutrition Follow-up  DOCUMENTATION CODES:  Non-severe (moderate) malnutrition in context of acute illness/injury  INTERVENTION:  Adjust tube feeding to nocturnal via cortrak: Osmolite 1.2 at 80 ml/h x 12 hours (960 ml per day) Prosource TF20 60 ml 1x/d Free water 120 q4h Provides  1232kcal, 73 gm protein, 787 ml free water daily (TF+flush = free water) Continue current diet as ordered per SLP Feeding assistance Encourage PO intake Automatic trays  Magic cup TID with meals, each supplement provides 290 kcal and 9 grams of protein   NUTRITION DIAGNOSIS:  Moderate Malnutrition related to acute illness (recent stroke and hospitalization) as evidenced by mild muscle depletion, percent weight loss (5% x 1 month). - remains applicable  GOAL:  Patient will meet greater than or equal to 90% of their needs - progressing  MONITOR:  PO intake, TF tolerance, I & O's, Labs  REASON FOR ASSESSMENT:  Consult Enteral/tube feeding initiation and management  ASSESSMENT:  Pt with hx of atrial fibrillation, HTN, HLD, DM type 2, CKD4, and recent CVA in September 2025 (residual left sided deficits), presented to ED with ongoing headaches x 3 days along with nausea and abdominal discomfort.  11/11 - presented to ED 11/13 - change in mentation, imaging showed PE 11/14 - SLP BSE, DYS1/thin when alert, cortrak placed 11/18 - SLP BSE, adjusted to DYS2/thin diet  Pt resting in bedside chair at the time of assessment, RN assisting with eating breakfast. Pt had diet upgraded to DYS2 this AM and so far had eaten about 1/3 of her tray. Very distracted and preoccupied, talking about calling her brother. RN having to redirect her frequently and pt still leaning heavily to the left.  Pt likely has the ability to meet at least some of her nutrition orally but will need feeding assistance and encouragement. Will add nutrition supplements (of note pt reports that milk hurts her stomach), adjust feeds to  nocturnal, and discussed with RN and resident team.  Admit weight: 80.7 kg (copied from last encounter) Current weight: 79.7 kg   5% weight loss noted in the last month (10/13-11/14) which is significant for time frame. Noted pt on Ozempic  since 06/2023 (8% weight loss over the last 9 months, with the majority occurring in the last month after hospitalization in September and discharge from CIR on 02/15/24)  Nutritionally Relevant Medications: Scheduled Meds:  cholestyramine  4 g Oral QID   ezetimibe   10 mg Oral Daily   famotidine   20 mg Oral Daily   insulin  aspart  0-15 Units Subcutaneous TID WC   insulin  glargine-yfgn  10 Units Subcutaneous QHS   methimazole   10 mg Oral Daily   rosuvastatin   20 mg Oral QPC supper   senna  2 tablet Oral Daily   PRN Meds: ondansetron, polyethylene glycol  Labs Reviewed: Sodium 133 BUN 28, Creatinine 1.39 CBG ranges from 167-216 mg/dL over the last 24 hours HgbA1c 6.5% (01/22/24)  NUTRITION - FOCUSED PHYSICAL EXAM: Flowsheet Row Most Recent Value  Orbital Region No depletion  Upper Arm Region No depletion  Thoracic and Lumbar Region No depletion  Buccal Region No depletion  Temple Region No depletion  Clavicle Bone Region Mild depletion  Clavicle and Acromion Bone Region Mild depletion  Scapular Bone Region No depletion  Dorsal Hand Moderate depletion  Patellar Region Severe depletion  Anterior Thigh Region Severe depletion  Posterior Calf Region Severe depletion  Edema (RD Assessment) Moderate  Hair Reviewed  Eyes Reviewed  Mouth Reviewed  Skin Reviewed  Nails  Reviewed    Diet Order:   Diet Order             DIET DYS 2 Room service appropriate? No; Fluid consistency: Thin  Diet effective now                   EDUCATION NEEDS:  Not appropriate for education at this time  Skin:  Skin Assessment: Reviewed RN Assessment  Last BM:  11/17 - type 6  Height:  Ht Readings from Last 1 Encounters:  03/18/24 5' 3 (1.6 m)     Weight:  Wt Readings from Last 1 Encounters:  03/25/24 81.8 kg    Ideal Body Weight:  52.3 kg  BMI:  Body mass index is 31.95 kg/m.  Estimated Nutritional Needs:  Kcal:  1500-1700 kcal/d Protein:  75-90g/d Fluid:  1.5-1.7L/d    Megan Fischer, RD, LDN, CNSC Registered Dietitian II Please reach out via secure chat

## 2024-03-25 NOTE — Progress Notes (Signed)
 Physical Therapy Treatment Patient Details Name: Megan Fischer MRN: [id number] DOB: 06/10/1950 Today's Date: 03/25/2024   History of Present Illness 73 y/o F presenting to ED on 11/11 with worsening headache, restlessness, confusion x3 days. CTA significant for subsegmental PE. MRI head with R MCA CVA and petechial hemorrhage in territory of recent R PCA CVA.    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 Pt A&O to self and situation, but unable to state time and place. Pt conversational and pleasant today, but generally nonsensical throughout. Pt requiring modA for supine>sit, with pt requiring dense verbal and tactile cueing to come to correct side of bed. Pt with difficulty grasping RW with L UE, and ultimately decided to perform stand pivot to chair with mod face to face assist 2+. She requires physical assist for progressing B LE for transfer, and demonstrated limited ability to lateral step at EOB with L, and could not take lateral step with R. HR increased to 151 BPM with activity, but quickly decreased to 115 BPM  with seated rest break and RN notified Continuing to recommend post-acute rehab >3hrs/day to improve cognitive deficits and safety with functional mobility. Acute PT to follow.    If plan is discharge home, recommend the following: Two people to help with walking and/or transfers;Two people to help with bathing/dressing/bathroom;Supervision due to cognitive status;Direct supervision/assist for medications management;Direct supervision/assist for financial management;Assist for transportation   Can travel by private vehicle        Equipment Recommendations  None recommended by PT    Recommendations for Other Services Rehab consult     Precautions / Restrictions Precautions Precautions:  Fall;Other (comment) Recall of Precautions/Restrictions: Impaired Restrictions Weight Bearing Restrictions Per Provider Order: No     Mobility  Bed Mobility Overal bed mobility: Needs Assistance Bed Mobility: Supine to Sit     Supine to sit: Mod assist     General bed mobility comments: initially attempting to get out of R side of bed needing dense cueing to bring BLE to EOB on the L. Assist also needed for trunk and hip management to EOB.    Transfers Overall transfer level: Needs assistance Equipment used: Rolling walker (2 wheels), 2 person hand held assist Transfers: Sit to/from Stand, Bed to chair/wheelchair/BSC Sit to Stand: Mod assist, +2 physical assistance, +2 safety/equipment Stand pivot transfers: Max assist, +2 safety/equipment, +2 physical assistance         General transfer comment: Pt needing modA +2 for STS and maxA +2 for stand pivot transfer. Pt with difficulty taking lateral steps at EOB with R LE even with verbal and tactile cueing. B LE requiring assist for progression for pivot into chair    Ambulation/Gait                   Stairs             Wheelchair Mobility     Tilt Bed    Modified Rankin (Stroke Patients Only)       Balance Overall balance assessment: Needs assistance Sitting-balance support: Feet supported, No upper extremity supported Sitting balance-Leahy Scale: Fair     Standing balance support: Bilateral upper extremity supported Standing balance-Leahy Scale: Poor Standing balance comment: +2 A  Communication Communication Communication: Impaired Factors Affecting Communication: Difficulty expressing self  Cognition Arousal: Alert Behavior During Therapy: Flat affect   PT - Cognitive impairments: Orientation, Awareness, Attention, Sequencing, Problem solving, Safety/Judgement   Orientation impairments: Time, Place                   PT - Cognition Comments:  Pt A&O to self, birthday, and states she had a stroke. But, she states she is at [facility] and a rehab center when asked place, and unsure of day of week and month Following commands: Impaired Following commands impaired: Follows one step commands inconsistently    Cueing Cueing Techniques: Verbal cues, Tactile cues  Exercises      General Comments General comments (skin integrity, edema, etc.): Pt repeatedly stating she is tired at end of session, and that her legs are going to have surgery soon and that they will give out. Pt also repeatedly asking for ice cream, stating there was some in the fridge that she wanted us  to get. HR increased to 151 with activity, quickly decreased to 115 with seated rest break and RN notified      Pertinent Vitals/Pain Pain Assessment Pain Assessment: Faces Faces Pain Scale: No hurt Pain Intervention(s): Monitored during session    Home Living   Living Arrangements: Children Available Help at Discharge: Family;Available 24 hours/day Type of Home: Apartment Home Access: Level entry       Home Layout: One level        Prior Function            PT Goals (current goals can now be found in the care plan section) Acute Rehab PT Goals Patient Stated Goal: unable PT Goal Formulation: Patient unable to participate in goal setting Time For Goal Achievement: 04/05/24 Potential to Achieve Goals: Fair Progress towards PT goals: Progressing toward goals    Frequency    Min 3X/week      PT Plan      Co-evaluation PT/OT/SLP Co-Evaluation/Treatment: Yes Reason for Co-Treatment: Complexity of the patient's impairments (multi-system involvement) PT goals addressed during session: Mobility/safety with mobility OT goals addressed during session: Strengthening/ROM;Proper use of Adaptive equipment and DME      AM-PAC PT 6 Clicks Mobility   Outcome Measure  Help needed turning from your back to your side while in a flat bed without  using bedrails?: A Lot Help needed moving from lying on your back to sitting on the side of a flat bed without using bedrails?: A Lot Help needed moving to and from a bed to a chair (including a wheelchair)?: Total Help needed standing up from a chair using your arms (e.g., wheelchair or bedside chair)?: Total Help needed to walk in hospital room?: Total Help needed climbing 3-5 steps with a railing? : Total 6 Click Score: 8    End of Session Equipment Utilized During Treatment: Gait belt Activity Tolerance: Patient tolerated treatment well Patient left: in chair;with call bell/phone within reach;with chair alarm set Nurse Communication: Mobility status;Other (comment) (HR) PT Visit Diagnosis: Unsteadiness on feet (R26.81);Difficulty in walking, not elsewhere classified (R26.2);Other symptoms and signs involving the nervous system (R29.898);Hemiplegia and hemiparesis Hemiplegia - Right/Left: Left Hemiplegia - dominant/non-dominant: Non-dominant Hemiplegia - caused by: Cerebral infarction     Time: 9147-9084 PT Time Calculation (min) (ACUTE ONLY): 23 min  Charges:    $Therapeutic Activity: 8-22 mins PT General Charges $$ ACUTE PT VISIT: 1 Visit  Sanaya Gwilliam, SPT    Marchell Froman 03/25/2024, 10:31 AM

## 2024-03-25 NOTE — Progress Notes (Signed)
 Speech Language Pathology Treatment: Dysphagia  Patient Details Name: Megan Fischer MRN: [id number] DOB: 03-09-1951 Today's Date: 03/25/2024 Time: 9083-9071 SLP Time Calculation (min) (ACUTE ONLY): 12 min  Assessment / Plan / Recommendation Clinical Impression  Session target dysphagia for possible upgrade and pt uch more alert than prior. She states she wears dentures when eating hard foods but not available. Son called and will bring them in today. She was able to masticate saltine cracker timely without residue across several trials. No s/s aspiration with multiple sips thin via straw.  SLP upgraded to Dys 2 (minced), continue thin and now can likely take pills whole in applesauce. MD asking about removing Cortrak- SLP uncertain of her current intake and dietitian plans to see her today. Suspect it has increased as she is more alert- she stated she has been eating the puree. If intake has been low, hopefully it will increase as textures are advanced and once dentures present will see if can upgrade to Dys 3 or possibly regular. Session did not focus on cognition but she needed cues to reason asking why do I  need to eat and was confused stating her son doesn't drive but son stated she was referring to her brother.    HPI HPI: 73 yo female presented to [facility] with DKA, acute encephalopathy being treated via the protocol. Having nausea, vomitng and abdominal discomfort. CTH revealed no acute process. Ct chest Right lower lobe segmental pulmonary emboli. Positive for acute PE, New scattered left upper lobe pulmonary nodules measuring up to 5 mm, possibly infectious/inflammatory, but indeterminate. BSE 01/25/24 BSE on CIR, no difficulty swallowing and reg/thin and no f/u; BSE on 11/14 placed on Dysphagia 1/puree and has Cortrak.  ST f/u for diet tolerance/advancement and SLE.      SLP Plan  Continue with current plan of care          Recommendations  Diet recommendations: Dysphagia 2 (fine  chop);Thin liquid Liquids provided via: Cup;Straw Medication Administration: Whole meds with puree Supervision: Full supervision/cueing for compensatory strategies;Trained caregiver to feed patient Compensations: Slow rate;Small sips/bites;Minimize environmental distractions Postural Changes and/or Swallow Maneuvers: Seated upright 90 degrees                Rehab consult Oral care BID;Staff/trained caregiver to provide oral care   Frequent or constant Supervision/Assistance Dysphagia, unspecified (R13.10)     Continue with current plan of care     Dustin Olam Bull  03/25/2024, 9:48 AM

## 2024-03-25 NOTE — TOC Progression Note (Signed)
 Transition of Care [facility]) - Progression Note    Patient Details  Name: Megan Fischer MRN: [id number] Date of Birth: November 28, 1950  Transition of Care [facility]) CM/SW Contact  Almarie CHRISTELLA [place]ie, [place] Phone Number: 03/25/2024, 10:23 AM  Clinical Narrative:   CSW contacted by Rehab Admissions and MD that patient may not be able to tolerate CIR. Barriers to SNF placement at this time include nutrition, patient cannot discharge to SNF with cortrak. SLP and dietitian working with patient on diet. CSW to follow and fax out referral when appropriate.    Expected Discharge Plan: Skilled Nursing Facility Barriers to Discharge: Continued Medical Work up, English As A Second Language Teacher               Expected Discharge Plan and Services       Living arrangements for the past 2 months: Apartment                                       Social Drivers of Health (SDOH) Interventions SDOH Screenings   Food Insecurity: Patient Unable To Answer (03/21/2024)  Housing: Unknown (03/21/2024)  Transportation Needs: Patient Unable To Answer (03/21/2024)  Utilities: Patient Unable To Answer (03/21/2024)  Alcohol Screen: Low Risk  (03/01/2023)  Depression (PHQ2-9): Low Risk  (02/28/2024)  Financial Resource Strain: Low Risk  (10/19/2023)   Received from [facility]  Physical Activity: Insufficiently Active (03/01/2023)  Social Connections: Patient Unable To Answer (03/21/2024)  Stress: No Stress Concern Present (03/01/2023)  Tobacco Use: Low Risk  (02/28/2024)  Health Literacy: Adequate Health Literacy (03/01/2023)    Readmission Risk Interventions     No data to display

## 2024-03-26 DIAGNOSIS — Z7901 Long term (current) use of anticoagulants: Secondary | ICD-10-CM | POA: Diagnosis not present

## 2024-03-26 DIAGNOSIS — R410 Disorientation, unspecified: Secondary | ICD-10-CM

## 2024-03-26 DIAGNOSIS — I2699 Other pulmonary embolism without acute cor pulmonale: Secondary | ICD-10-CM | POA: Diagnosis not present

## 2024-03-26 DIAGNOSIS — N179 Acute kidney failure, unspecified: Secondary | ICD-10-CM | POA: Diagnosis not present

## 2024-03-26 DIAGNOSIS — I63511 Cerebral infarction due to unspecified occlusion or stenosis of right middle cerebral artery: Secondary | ICD-10-CM | POA: Diagnosis not present

## 2024-03-26 DIAGNOSIS — I4891 Unspecified atrial fibrillation: Secondary | ICD-10-CM | POA: Diagnosis not present

## 2024-03-26 DIAGNOSIS — E1129 Type 2 diabetes mellitus with other diabetic kidney complication: Secondary | ICD-10-CM

## 2024-03-26 DIAGNOSIS — R1310 Dysphagia, unspecified: Secondary | ICD-10-CM

## 2024-03-26 LAB — CBC
HCT: 34.9 % — ABNORMAL LOW (ref 36.0–46.0)
Hemoglobin: 11.7 g/dL — ABNORMAL LOW (ref 12.0–15.0)
MCH: 28.7 pg (ref 26.0–34.0)
MCHC: 33.5 g/dL (ref 30.0–36.0)
MCV: 85.7 fL (ref 80.0–100.0)
Platelets: 212 K/uL (ref 150–400)
RBC: 4.07 MIL/uL (ref 3.87–5.11)
RDW: 15.5 % (ref 11.5–15.5)
WBC: 7.7 K/uL (ref 4.0–10.5)
nRBC: 0 % (ref 0.0–0.2)

## 2024-03-26 LAB — GLUCOSE, CAPILLARY
Glucose-Capillary: 183 mg/dL — ABNORMAL HIGH (ref 70–99)
Glucose-Capillary: 196 mg/dL — ABNORMAL HIGH (ref 70–99)
Glucose-Capillary: 107 mg/dL — ABNORMAL HIGH (ref 70–99)
Glucose-Capillary: 189 mg/dL — ABNORMAL HIGH (ref 70–99)
Glucose-Capillary: 134 mg/dL — ABNORMAL HIGH (ref 70–99)

## 2024-03-26 LAB — BASIC METABOLIC PANEL WITH GFR
Anion gap: 9 (ref 5–15)
BUN: 31 mg/dL — ABNORMAL HIGH (ref 8–23)
CO2: 21 mmol/L — ABNORMAL LOW (ref 22–32)
Calcium: 8.8 mg/dL — ABNORMAL LOW (ref 8.9–10.3)
Chloride: 103 mmol/L (ref 98–111)
Creatinine, Ser: 1.33 mg/dL — ABNORMAL HIGH (ref 0.44–1.00)
GFR, Estimated: 42 mL/min — ABNORMAL LOW (ref >60)
Glucose, Bld: 228 mg/dL — ABNORMAL HIGH (ref 70–99)
Potassium: 5.1 mmol/L (ref 3.5–5.1)
Sodium: 133 mmol/L — ABNORMAL LOW (ref 135–145)

## 2024-03-26 LAB — HEPARIN LEVEL (UNFRACTIONATED): Heparin Unfractionated: 0.38 [IU]/mL (ref 0.30–0.70)

## 2024-03-26 MED ORDER — APIXABAN 5 MG PO TABS
5.0000 mg | ORAL_TABLET | Freq: Two times a day (BID) | ORAL | Status: DC
  Administered 2024-03-27 – 2024-03-29 (×5): 5 mg via ORAL
  Filled 2024-03-26 (×5): qty 1

## 2024-03-26 MED ORDER — LORAZEPAM 1 MG PO TABS
1.0000 mg | ORAL_TABLET | Freq: Once | ORAL | Status: DC | PRN
Start: 2024-03-26 — End: 2024-03-29

## 2024-03-26 MED ORDER — APIXABAN 5 MG PO TABS: 5.0000 mg | ORAL_TABLET | Freq: Two times a day (BID) | ORAL | Status: DC

## 2024-03-26 MED ORDER — APIXABAN 5 MG PO TABS: 10.0000 mg | ORAL_TABLET | Freq: Two times a day (BID) | ORAL | Status: DC

## 2024-03-26 MED ORDER — SENNA 8.6 MG PO TABS
2.0000 | ORAL_TABLET | Freq: Every day | ORAL | Status: DC
  Administered 2024-03-27 – 2024-03-28 (×2): 17.2 mg via ORAL
  Filled 2024-03-26 (×3): qty 2

## 2024-03-26 MED ORDER — FREE WATER
120.0000 mL | Status: DC
  Administered 2024-03-26 – 2024-03-27 (×7): 120 mL

## 2024-03-26 MED ORDER — LEVETIRACETAM 500 MG PO TABS
500.0000 mg | ORAL_TABLET | Freq: Two times a day (BID) | ORAL | Status: AC
  Administered 2024-03-26 – 2024-03-27 (×3): 500 mg via ORAL
  Filled 2024-03-26 (×2): qty 1

## 2024-03-26 MED ORDER — ENSURE PLUS HIGH PROTEIN PO LIQD
237.0000 mL | Freq: Two times a day (BID) | ORAL | Status: DC
  Administered 2024-03-27 (×2): 237 mL via ORAL

## 2024-03-26 MED ORDER — ACETAMINOPHEN 160 MG/5ML PO SOLN
1000.0000 mg | Freq: Four times a day (QID) | ORAL | Status: DC | PRN
  Administered 2024-03-26: 1000 mg via ORAL
  Filled 2024-03-26: qty 40.6

## 2024-03-26 MED ORDER — EZETIMIBE 10 MG PO TABS
10.0000 mg | ORAL_TABLET | Freq: Every day | ORAL | Status: DC
  Administered 2024-03-27 – 2024-03-29 (×3): 10 mg via ORAL
  Filled 2024-03-26 (×3): qty 1

## 2024-03-26 MED ORDER — METHIMAZOLE 10 MG PO TABS
10.0000 mg | ORAL_TABLET | Freq: Every day | ORAL | Status: DC
  Administered 2024-03-27 – 2024-03-29 (×3): 10 mg via ORAL
  Filled 2024-03-26 (×3): qty 1

## 2024-03-26 MED ORDER — ATORVASTATIN CALCIUM 40 MG PO TABS
40.0000 mg | ORAL_TABLET | Freq: Every day | ORAL | Status: DC
  Administered 2024-03-26 – 2024-03-29 (×4): 40 mg via ORAL
  Filled 2024-03-26 (×4): qty 1

## 2024-03-26 MED ORDER — ASPIRIN 81 MG PO CHEW
81.0000 mg | CHEWABLE_TABLET | Freq: Every day | ORAL | Status: DC
  Administered 2024-03-26 – 2024-03-29 (×4): 81 mg via ORAL
  Filled 2024-03-26 (×3): qty 1

## 2024-03-26 MED ORDER — FAMOTIDINE 20 MG PO TABS
20.0000 mg | ORAL_TABLET | Freq: Every day | ORAL | Status: DC
  Administered 2024-03-27 – 2024-03-29 (×3): 20 mg via ORAL
  Filled 2024-03-26 (×3): qty 1

## 2024-03-26 MED ORDER — METOPROLOL TARTRATE 25 MG PO TABS
25.0000 mg | ORAL_TABLET | Freq: Two times a day (BID) | ORAL | Status: DC
  Administered 2024-03-26 – 2024-03-29 (×6): 25 mg via ORAL
  Filled 2024-03-26 (×6): qty 1

## 2024-03-26 MED ORDER — APIXABAN 5 MG PO TABS
10.0000 mg | ORAL_TABLET | Freq: Two times a day (BID) | ORAL | Status: DC
  Administered 2024-03-26: 10 mg via ORAL
  Filled 2024-03-26: qty 2

## 2024-03-26 NOTE — Progress Notes (Signed)
 IP rehab admissions - Case opened with insurance carrier today after consult done by Rehab MD.  Will await response from insurance case manager regarding potential admit to CIR.  [phone]

## 2024-03-26 NOTE — Progress Notes (Addendum)
 PHARMACY - ANTICOAGULATION  Pharmacy Consult for heparin Indication: submassive PE  and afib   Allergies  Allergen Reactions   Aspirin  Nausea Only    Patient stated to neuro that it caused an upset stomach   Codeine Other (See Comments)    Unknown    Fentanyl  Other (See Comments)    hypoxia   Oxycodone Hcl Other (See Comments)    Unknown    Penicillins Other (See Comments)    Unknown    Chlorhexidine Gluconate Rash    Patient Measurements: Height: 5' 3 (160 cm) Weight: 81.8 kg (180 lb 5.4 oz) IBW/kg (Calculated) : 52.4 HEPARIN DW (KG): 70.1  Vital Signs: Temp: 98.3 F (36.8 C) (11/19 0744) Temp Source: Oral (11/19 0744) BP: 136/63 (11/19 0744) Pulse Rate: 75 (11/19 0744)  Labs: Recent Labs    03/23/24 1015 03/23/24 1800 03/24/24 0410 03/24/24 0410 03/24/24 1830 03/25/24 0540 03/26/24 0714  HGB  --   --  12.2   < >  --  12.5 11.7*  HCT  --   --  36.3  --   --  36.9 34.9*  PLT  --   --  200  --   --  204 212  APTT 22* 67* 77*  --   --   --   --   HEPARINUNFRC  --   --  0.36   < > 0.56 0.36 0.38  CREATININE  --   --  1.68*  --   --  1.39* 1.33*   < > = values in this interval not displayed.    Estimated Creatinine Clearance: 38.2 mL/min (A) (by C-G formula based on SCr of 1.33 mg/dL (H)).  Assessment: 73 y.o. female admitted with CVA, found to have RLL submassive PE with RHS. Patient was on apixaban  PTA for Afib, last dose 11/10. Of note, son reported patient has been taking apixaban  one tablet once daily. Will not consider the PE an Eliquis  failure. Pharmacy consulted for heparin dosing.     Heparin level 0.38, therapeutic, on 750 units/hr. Hgb, PLT WNL. No bleeding reported per RN. Transitioned back to apixaban  since patient is swallowing normally and eating a regular diet now instead of tube feeds.     Goal of Therapy:  Heparin level 0.3-0.5 units/ml aPTT 66-85 seconds Monitor platelets by anticoagulation protocol: Yes   Plan:  Discontinue heparin  infusion Start apixaban  5 mg PO BID Monitor CBC and signs/symptoms of bleeding   Izetta Carl, PharmD PGY1 Pharmacy Resident

## 2024-03-26 NOTE — Plan of Care (Signed)

## 2024-03-26 NOTE — Progress Notes (Signed)
 Calorie Count Note  48 hour calorie count ordered. Day 1 complete, pt's PO intake is meeting 31% of her calorie and 15% of her protein needs. Pt refusing most meat on trays and magic cups. Will add Ensure as an oral supplement to promote better PO intake and follow up tomorrow with day 2. Pt being assessed for admission to CIR, therefore Cortrak will not be barrier to discharge. Recommend continuing nocturnal tube feeds until PO intake improves.   Diet: DYS 2, thins Supplements: magic cups  Estimated Nutritional Needs:  Kcal:  1500-1700 kcal/d Protein:  75-90g/d  Day 1: 11/18 Breakfast: 100% grits, orange juice; 10% home fries, sausage (206 kcal, 4 g protein) Lunch: 0% Dinner: 100% mashed potatoes w/ gravy; 50% broccoli cheddar soup (263 kcal, 7 g protein) Supplements: 0% of 2 magic cups  Total intake: 469 kcal (31% of minimum estimated needs)  11 protein (15% of minimum estimated needs)  Day 2: 11/19 Breakfast: 100% juice; 50% applesauce, sausage (200 kcal, 2.5 g protein) Lunch: 75% potatoes, chicken, gravy, broccoli cheddar soup (396 kcal, 27 g protein)  INTERVENTION:  Added Ensure Plus High Protein po BID, each supplement provides 350 kcal and 20 grams of protein.  Adjust tube feeding to nocturnal via cortrak: Osmolite 1.2 at 80 ml/h x 12 hours (960 ml per day) Prosource TF20 60 ml 1x/d Free water 120 q4h Provides  1232kcal, 73 gm protein, 787 ml free water daily (TF+flush = free water) Continue current diet as ordered per SLP Feeding assistance Encourage PO intake Automatic trays  Magic cup TID with meals, each supplement provides 290 kcal and 9 grams of protein     NUTRITION DIAGNOSIS:  Moderate Malnutrition related to acute illness (recent stroke and hospitalization) as evidenced by mild muscle depletion, percent weight loss (5% x 1 month). - remains applicable   GOAL:  Patient will meet greater than or equal to 90% of their needs -  progressing     Megan Glance, MS, RDN, LDN Clinical Dietitian I Please reach out via secure chat

## 2024-03-26 NOTE — Progress Notes (Signed)
 Physical Therapy Treatment Patient Details Name: Megan Fischer MRN: [id number] DOB: 04/29/1951 Today's Date: 03/26/2024   History of Present Illness 73 y/o F presenting to ED on 11/11 with worsening headache, restlessness, confusion x3 days. CTA significant for subsegmental PE. MRI head with R MCA CVA and petechial hemorrhage in territory of recent R PCA CVA.    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 Pt was initially confused/restless stating Watch out, they are going to hit me!. Easy to re-direct by giving pt a task and changing the conversation. Pt required MaxA for bed mobility and MinA/CGA for seated balance. Pt would occasionally lean posteriorly with multimodal cueing to maintain upright posture. Pt able to perform x3 sit<>stand repetitions with ModAx2 and 2HH. Pt was able to slightly move LLE towards HOB, but was unable to take steps forwards. Performed stand-pivot to the recliner with MaxAx2 and use of bed pad. Pt reported feeling like she was going to fall while transferring and began to lean posteriorly. Able to control lower to recliner with assist. Pt fatigued at end of session and keeping eyes closed. Continue to recommend >3hrs post acute rehab with acute PT to follow.    If plan is discharge home, recommend the following: Two people to help with walking and/or transfers;Two people to help with bathing/dressing/bathroom;Supervision due to cognitive status;Direct supervision/assist for medications management;Direct supervision/assist for financial management;Assist for transportation   Can travel by private vehicle      No  Equipment Recommendations  None recommended by PT       Precautions / Restrictions Precautions Precautions: Fall;Other (comment) Recall of Precautions/Restrictions: Impaired Precaution/Restrictions  Comments: L homonymous hemianopsia, bilateral mitts, NGT, watch HR Restrictions Weight Bearing Restrictions Per Provider Order: No     Mobility  Bed Mobility Overal bed mobility: Needs Assistance Bed Mobility: Supine to Sit    Supine to sit: Max assist    General bed mobility comments: Assist needed to bring LE's towards EOB and to raise trunk. Unable to shift hips forwards w/o assist.    Transfers Overall transfer level: Needs assistance Equipment used: 2 person hand held assist Transfers: Sit to/from Stand, Bed to chair/wheelchair/BSC Sit to Stand: Mod assist, +2 physical assistance, +2 safety/equipment Stand pivot transfers: Max assist, +2 safety/equipment, +2 physical assistance      General transfer comment: ModAx2 with 2HH to boost-up and steady with use of bed pad. Able to take one step towards HOB with LLE. Required MaxAx2 to stand-pivot with heavy posterior lean during transfer.    Ambulation/Gait  General Gait Details: unable     Modified Rankin (Stroke Patients Only) Modified Rankin (Stroke Patients Only) Pre-Morbid Rankin Score: Moderately severe disability Modified Rankin: Severe disability     Balance Overall balance assessment: Needs assistance Sitting-balance support: Feet supported, Bilateral upper extremity supported Sitting balance-Leahy Scale: Poor Sitting balance - Comments: MinA/CGA with occasional posterior lean Postural control: Posterior lean Standing balance support: Bilateral upper extremity supported Standing balance-Leahy Scale: Zero Standing balance comment: posterior lean when attempting to transfer, +2A     Communication Communication Communication: Impaired Factors Affecting Communication: Difficulty expressing self  Cognition Arousal: Alert Behavior During Therapy: Flat affect   PT - Cognitive impairments: Orientation, Awareness, Attention, Sequencing, Problem solving, Safety/Judgement   Orientation impairments: Time, Place     PT - Cognition Comments: Pt disoriented  and repeatedly warned PT to watch out due to belief that nursing staff was going to hit her Following commands: Impaired Following commands impaired: Follows one step commands inconsistently    Cueing Cueing Techniques: Verbal cues, Tactile cues  Exercises General Exercises - Lower Extremity Ankle Circles/Pumps: AROM, Both, 10 reps, Seated Long Arc Quad: AAROM, AROM, Both, 10 reps, Seated Other Exercises Other Exercises: x3 sit<>stand with ModAx2 and 2HH        Pertinent Vitals/Pain Pain Assessment Pain Assessment: No/denies pain     PT Goals (current goals can now be found in the care plan section) Acute Rehab PT Goals PT Goal Formulation: Patient unable to participate in goal setting Time For Goal Achievement: 04/05/24 Potential to Achieve Goals: Fair Progress towards PT goals: Progressing toward goals    Frequency    Min 3X/week       AM-PAC PT 6 Clicks Mobility   Outcome Measure  Help needed turning from your back to your side while in a flat bed without using bedrails?: A Lot Help needed moving from lying on your back to sitting on the side of a flat bed without using bedrails?: A Lot Help needed moving to and from a bed to a chair (including a wheelchair)?: Total Help needed standing up from a chair using your arms (e.g., wheelchair or bedside chair)?: Total Help needed to walk in hospital room?: Total Help needed climbing 3-5 steps with a railing? : Total 6 Click Score: 8    End of Session Equipment Utilized During Treatment: Gait belt Activity Tolerance: Patient tolerated treatment well Patient left: in chair;with call bell/phone within reach;with chair alarm set;Other (comment) (bilateral mitts donned) Nurse Communication: Mobility status;Other (comment);Need for lift equipment (HR) PT Visit Diagnosis: Unsteadiness on feet (R26.81);Difficulty in walking, not elsewhere classified (R26.2);Other symptoms and signs  involving the nervous system (R29.898);Hemiplegia and hemiparesis Hemiplegia - Right/Left: Left Hemiplegia - dominant/non-dominant: Non-dominant Hemiplegia - caused by: Cerebral infarction     Time: 1430-1450 PT Time Calculation (min) (ACUTE ONLY): 20 min  Charges:    $Therapeutic Activity: 8-22 mins PT General Charges $$ ACUTE PT VISIT: 1 Visit                    Kate ORN, PT, DPT Secure Chat Preferred  Rehab Office [phone]   Kate BRAVO Wendolyn 03/26/2024, 3:34 PM

## 2024-03-26 NOTE — Consult Note (Signed)
 Physical Medicine and Rehabilitation Consult Reason for Consult: Rehab Referring Physician: Dr. Lera   HPI: Megan Fischer is a 73 y.o. female with past medical history of hypertension, diabetes mellitus type 2, A-fib, CKD 4, prior right PCA stroke who presented to the hospital with worsening headache and confusion and was admitted for encephalopathy.  Patient was discharged on 02/14/2024 previously from CIR for right PCA CVA involving the right thalamus.  MRI of the brain showed new areas of cortical restricted diffusion along the right insula, mesial right temporal lobe suspicious for acute MCA territory infarct in addition to subacute infarct in the PCA territory.  CTA with known right ICA occlusion.  Neurology feels like right MCA CVA likely due to chronic right ICA occlusion in setting of hypotension, volume depletion and DKA.  Started on heparin for right lower lobe PE.  Followed by SLP on dysphagia 2 thin diet and also has core track.  Patient evaluated by PT and OT felt to be a candidate for CIR.  Per chart review patient lives in an [place]. 1 level apartment with level entry.  Family can assist 24/7 after discharge.    Home: Home Living Family/patient expects to be discharged to:: Private residence Living Arrangements: Children Available Help at Discharge: Family, Available 24 hours/day Type of Home: Apartment Home Access: Level entry Home Layout: One level Bathroom Shower/Tub: Engineer, [employer] Systems: Standard Bathroom Accessibility: Yes Home Equipment: Grab bars - tub/shower, Cane - single point, Toilet riser Additional Comments: info taken from prior admission  Lives With: Son, Family  Functional History: Prior Function Prior Level of Function : Independent/Modified Independent Mobility Comments: cane PRN ADLs Comments: son assists with ADLs Functional Status:  Mobility: Bed Mobility Overal bed mobility: Needs Assistance Bed Mobility: Supine to  Sit Rolling: Total assist Supine to sit: Mod assist General bed mobility comments: initially attempting to get out of R side of bed needing dense cueing to bring BLE to EOB on the L. Assist also needed for trunk and hip management to EOB. Transfers Overall transfer level: Needs assistance Equipment used: Rolling walker (2 wheels), 2 person hand held assist Transfers: Sit to/from Stand, Bed to chair/wheelchair/BSC Sit to Stand: Mod assist, +2 physical assistance, +2 safety/equipment Bed to/from chair/wheelchair/BSC transfer type:: Stand pivot Stand pivot transfers: Max assist, +2 safety/equipment, +2 physical assistance General transfer comment: Pt needing modA +2 for STS and maxA +2 for stand pivot transfer. Pt with difficulty taking lateral steps at EOB with R LE even with verbal and tactile cueing. B LE requiring assist for progression for pivot into chair      ADL: ADL Overall ADL's : Needs assistance/impaired Eating/Feeding: NPO Grooming: Moderate assistance Upper Body Bathing: Moderate assistance Lower Body Bathing: Maximal assistance Upper Body Dressing : Moderate assistance Lower Body Dressing: Maximal assistance Toilet Transfer: Moderate assistance, +2 for physical assistance, +2 for safety/equipment, Maximal assistance, Stand-pivot Toilet Transfer Details (indicate cue type and reason): mod A +2 for rise. initially mod A to progress LLE, but with fatigue, max A for BLE progression to chair Functional mobility during ADLs: Moderate assistance, +2 for safety/equipment, +2 for physical assistance  Cognition: Cognition Overall Cognitive Status: Impaired/Different from baseline Arousal/Alertness: Awake/alert Orientation Level: Oriented to person Attention: Sustained Sustained Attention: Impaired Sustained Attention Impairment: Verbal basic, Functional basic Memory: Impaired Memory Impairment: Decreased short term memory, Retrieval deficit, Decreased recall of new  information Decreased Short Term Memory: Verbal basic, Functional basic Awareness: Impaired Awareness Impairment: Anticipatory impairment Problem  Solving: Impaired Problem Solving Impairment: Verbal basic, Functional basic Behaviors: Impulsive, Restless, Perseveration Safety/Judgment: Impaired Comments: Attempting to remove tubes/TF; has mitts Cognition Arousal: Alert Behavior During Therapy: Flat affect Overall Cognitive Status: Impaired/Different from baseline   Review of Systems  Reason unable to perform ROS: Limited by congnition.  Constitutional:  Negative for fever.  Respiratory:  Negative for shortness of breath.   Cardiovascular:  Negative for chest pain.   Past Medical History:  Diagnosis Date  . Atrial fibrillation (HCC)   . Chronic disease anemia 03/29/2011  . Diabetes mellitus   . Dyslipidemia   . Hypertension   . Obesities, morbid (HCC)   . Osteoarthritis    Past Surgical History:  Procedure Laterality Date  . CHOLECYSTECTOMY    . REPLACEMENT TOTAL KNEE BILATERAL    . SHOULDER SURGERY     RIGHT SHOULDER  . TUBAL LIGATION     Family History  Problem Relation Age of Onset  . Heart attack Mother   . Stroke Mother   . Diabetes Mother   . Hypertension Brother   . Breast cancer Neg Hx    Social History:  reports that she has never smoked. She has never used smokeless tobacco. She reports that she does not drink alcohol and does not use drugs. Allergies:  Allergies  Allergen Reactions  . Aspirin  Nausea Only    Patient stated to neuro that it caused an upset stomach  . Codeine Other (See Comments)    Unknown   . Fentanyl  Other (See Comments)    hypoxia  . Oxycodone Hcl Other (See Comments)    Unknown   . Penicillins Other (See Comments)    Unknown   . Chlorhexidine Gluconate Rash   Medications Prior to Admission  Medication Sig Dispense Refill  . acetaminophen  (TYLENOL ) 325 MG tablet Take 1-2 tablets (325-650 mg total) by mouth every 4 (four)  hours as needed for mild pain (pain score 1-3).    . amLODipine  (NORVASC ) 2.5 MG tablet Take 1 tablet (2.5 mg total) by mouth daily. 30 tablet 0  . apixaban  (ELIQUIS ) 5 MG TABS tablet Take 1 tablet (5 mg total) by mouth 2 (two) times daily. 200 tablet 2  . aspirin  EC 81 MG tablet Take 1 tablet (81 mg total) by mouth daily. Swallow whole. 30 tablet 0  . benazepril  (LOTENSIN ) 20 MG tablet TAKE 1 TABLET BY MOUTH DAILY 100 tablet 2  . CALCIUM  PO Take 1 tablet by mouth 2 (two) times daily.    . camphor-menthol  (SARNA) lotion Apply topically as needed for itching. 222 mL 0  . celecoxib  (CELEBREX ) 200 MG capsule Take 1 capsule (200 mg total) by mouth daily as needed for moderate pain (pain score 4-6). 30 capsule 0  . diclofenac  Sodium (VOLTAREN ) 1 % GEL Apply 2 g topically 4 (four) times daily. To both knees 150 g 0  . DULoxetine  (CYMBALTA ) 60 MG capsule Take 60 mg by mouth daily.    . ezetimibe  (ZETIA ) 10 MG tablet Take 1 tablet (10 mg total) by mouth daily. 90 tablet 2  . famotidine  (PEPCID ) 20 MG tablet Take 1 tablet (20 mg total) by mouth daily. 30 tablet 0  . gabapentin  (NEURONTIN ) 100 MG capsule Take 1 capsule (100 mg total) by mouth 3 (three) times daily. (Patient taking differently: Take 300 mg by mouth See admin instructions. 1-2 times daily only) 90 capsule 0  . glimepiride  (AMARYL ) 1 MG tablet Take 1 tablet (1 mg total) by mouth daily with breakfast.  90 tablet 4  . JARDIANCE  25 MG TABS tablet Take 25 mg by mouth daily.    . lidocaine  (LIDODERM ) 5 % Place 2 patches onto the skin daily. Remove & Discard patch within 12 hours or as directed by MD 60 patch 0  . methimazole  (TAPAZOLE ) 5 MG tablet TAKE 1 TABLET BY MOUTH DAILY 100 tablet 2  . methocarbamol  (ROBAXIN ) 500 MG tablet Take 1 tablet (500 mg total) by mouth every 6 (six) hours as needed for muscle spasms. 30 tablet 0  . metoprolol  succinate (TOPROL -XL) 50 MG 24 hr tablet TAKE 1 TABLET BY MOUTH DAILY  WITH OR IMMEDIATELY FOLLOWING A  MEAL 90  tablet 3  . Multiple Vitamins-Minerals (MULTIVITAMIN WITH MINERALS) tablet Take 1 tablet by mouth daily.    . rosuvastatin  (CRESTOR ) 20 MG tablet Take 1 tablet (20 mg total) by mouth daily after supper. 30 tablet 0  . Semaglutide , 2 MG/DOSE, (OZEMPIC , 2 MG/DOSE,) 8 MG/3ML SOPN INJECT SUBCUTANEOUSLY 2 MG EVERY WEEK 9 mL 3  . Blood Glucose Monitoring Suppl DEVI 1 each by Does not apply route as directed. Dispense based on patient and insurance preference. Use up to four times daily as directed. (FOR ICD-10 E10.9, E11.9). 1 each 0  . ferrous sulfate  325 (65 FE) MG EC tablet Take 1 tablet (325 mg total) by mouth 2 (two) times daily. (Patient not taking: Reported on 03/18/2024) 60 tablet 3  . Glucose Blood (BLOOD GLUCOSE TEST STRIPS) STRP 1 each by Does not apply route as directed. Dispense based on patient and insurance preference. Use up to four times daily as directed. (FOR ICD-10 E10.9, E11.9). 100 strip 0  . Lancet Device MISC 1 each by Does not apply route as directed. Dispense based on patient and insurance preference. Use up to four times daily as directed. (FOR ICD-10 E10.9, E11.9). 1 each 0  . Lancets MISC 1 each by Does not apply route as directed. Dispense based on patient and insurance preference. Use up to four times daily as directed. (FOR ICD-10 E10.9, E11.9). 100 each 0     Blood pressure 123/86, pulse 73, temperature 97.9 F (36.6 C), temperature source Oral, resp. rate 18, height 5' 3 (1.6 m), weight 81.8 kg, SpO2 100%. Physical Exam  General: NAD, wearing mittens b/l , obese HEENT: Head is normocephalic, atraumatic, Cortrak in place Heart: IIR. No murmurs rubs or gallops Chest: CTA bilaterally Abdomen: Soft, non-tender, non-distended, bowel sounds positive. Extremities:  b/l LE edema present Psych: A little agitated Skin: Clean and intact without signs of breakdown Neuro:    Mental Status: AAO to person and hospital, able to say she had a stroke.  Keeps eyes closed but will  open them intermittently.  Very confused.  Limited cooperation with exam repeating you are not my big Ben  Speech/Languate: Fluent, intermittent following of commands  CRANIAL NERVES: Right gaze preference.  No facial droop noted.  Would not cooperate fully with exam.  Appears to have some left neglect.   MOTOR: Would not cooperate with MMT but moving bilateral upper extremities to gravity.  Moving right lower extremity to gravity and minimal movements noted in left lower extremity.    Results for orders placed or performed during the hospital encounter of 03/18/24 (from the past 24 hours)  Glucose, capillary     Status: Abnormal   Collection Time: 03/25/24  3:51 PM  Result Value Ref Range   Glucose-Capillary 240 (H) 70 - 99 mg/dL  Glucose, capillary  Status: Abnormal   Collection Time: 03/25/24  9:36 PM  Result Value Ref Range   Glucose-Capillary 154 (H) 70 - 99 mg/dL  Glucose, capillary     Status: Abnormal   Collection Time: 03/26/24  6:59 AM  Result Value Ref Range   Glucose-Capillary 183 (H) 70 - 99 mg/dL  Heparin level (unfractionated)     Status: None   Collection Time: 03/26/24  7:14 AM  Result Value Ref Range   Heparin Unfractionated 0.38 0.30 - 0.70 IU/mL  CBC     Status: Abnormal   Collection Time: 03/26/24  7:14 AM  Result Value Ref Range   WBC 7.7 4.0 - 10.5 K/uL   RBC 4.07 3.87 - 5.11 MIL/uL   Hemoglobin 11.7 (L) 12.0 - 15.0 g/dL   HCT 65.0 (L) 63.9 - 53.9 %   MCV 85.7 80.0 - 100.0 fL   MCH 28.7 26.0 - 34.0 pg   MCHC 33.5 30.0 - 36.0 g/dL   RDW 84.4 88.4 - 84.4 %   Platelets 212 150 - 400 K/uL   nRBC 0.0 0.0 - 0.2 %  Basic metabolic panel     Status: Abnormal   Collection Time: 03/26/24  7:14 AM  Result Value Ref Range   Sodium 133 (L) 135 - 145 mmol/L   Potassium 5.1 3.5 - 5.1 mmol/L   Chloride 103 98 - 111 mmol/L   CO2 21 (L) 22 - 32 mmol/L   Glucose, Bld 228 (H) 70 - 99 mg/dL   BUN 31 (H) 8 - 23 mg/dL   Creatinine, Ser 8.66 (H) 0.44 - 1.00 mg/dL    Calcium  8.8 (L) 8.9 - 10.3 mg/dL   GFR, Estimated 42 (L) >60 mL/min   Anion gap 9 5 - 15  Glucose, capillary     Status: Abnormal   Collection Time: 03/26/24  7:46 AM  Result Value Ref Range   Glucose-Capillary 196 (H) 70 - 99 mg/dL  Glucose, capillary     Status: Abnormal   Collection Time: 03/26/24 11:19 AM  Result Value Ref Range   Glucose-Capillary 107 (H) 70 - 99 mg/dL   No results found.  Assessment/Plan: Diagnosis: Right MCA ischemic stroke with prior right PCA stroke in addition to encephalopathy. Does the need for close, 24 hr/day medical supervision in concert with the patient's rehab needs make it unreasonable for this patient to be served in a less intensive setting? Yes Co-Morbidities requiring supervision/potential complications:  - AKI, diabetes mellitus type 2, abnormal EEG, hypothyroidism, hyperlipidemia, hypertension, A-fib, PE, dysphagia, obesity, Due to bladder management, bowel management, safety, skin/wound care, disease management, medication administration, pain management, and patient education, does the patient require 24 hr/day rehab nursing? Yes Does the patient require coordinated care of a physician, rehab nurse, therapy disciplines of PT, OT, SLP to address physical and functional deficits in the context of the above medical diagnosis(es)? Yes Addressing deficits in the following areas: balance, endurance, locomotion, strength, transferring, bowel/bladder control, bathing, dressing, feeding, grooming, toileting, cognition, speech, language, swallowing, and psychosocial support Can the patient actively participate in an intensive therapy program of at least 3 hrs of therapy per day at least 5 days per week? Yes The potential for patient to make measurable gains while on inpatient rehab is excellent Anticipated functional outcomes upon discharge from inpatient rehab are min assist  with PT, min assist with OT, min assist with SLP. Estimated rehab length of  stay to reach the above functional goals is: 12-16 Anticipated discharge destination: Home Overall  Rehab/Functional Prognosis: good  POST ACUTE RECOMMENDATIONS: This patient's condition is appropriate for continued rehabilitative care in the following setting: CIR Patient has agreed to participate in recommended program. Yes Note that insurance prior authorization may be required for reimbursement for recommended care.  Comment: Think she would be a good candidate for CIR, rehab admissions coordinator follow-up.   I have personally performed a face to face diagnostic evaluation of this patient. Additionally, I have examined the patient's medical record including any pertinent labs and radiographic images.    Thanks,  Murray Collier, MD 03/26/2024

## 2024-03-26 NOTE — Progress Notes (Addendum)
 HD#7 SUBJECTIVE:  Patient Summary: Megan Fischer is a 73 y.o. 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 Hospitalization has been complicated by euglycemic DKA with lactic acidosis (resolved), acute PE, and an acute R MCA infarct with petechial hemorrhage of prior R PCA infarct.   Overnight Events: None.  Interim History: Today the patient reports that her friends, [employer] and [employer] (a new imaginary friend), are gone. She repeatedly states that she in in the [facility]. She reports feeling tired and asking where she is going. She reports that she was able to get out of bed yesterday for physical therapy and describes the experience as wonderful. She denies shortness of breath, headache, chest pain, or leg pain. She does not report any new symptoms or concerns.   OBJECTIVE:  Vital Signs: Vitals:   03/25/24 1507 03/25/24 2036 03/25/24 2315 03/26/24 0305  BP: (!) 140/82 (!) 155/130 (!) 137/48 (!) 153/71  Pulse: 71 (!) 105 68   Resp: 20 15    Temp: 97.6 F (36.4 C) 98.2 F (36.8 C) 98.7 F (37.1 C) 98.4 F (36.9 C)  TempSrc: Oral Oral Oral Oral  SpO2: 98% 93% 97% 98%  Weight:      Height:       Supplemental O2: Room Air SpO2: 98 %  Filed Weights   03/23/24 0617 03/24/24 0500 03/25/24 0500  Weight: 81.4 kg 81.7 kg 81.8 kg    Intake/Output Summary (Last 24 hours) at 03/26/2024 0724 Last data filed at 03/26/2024 0400 Gross per 24 hour  Intake 1421.44 ml  Output 2100 ml  Net -678.56 ml   Net IO Since Admission: 2,314.18 mL [03/26/24 0724]  Physical Exam:  Physical Exam Vitals and nursing note reviewed. Exam conducted with a chaperone present.  Constitutional:      General: She is not in acute distress.    Appearance: She is ill-appearing. She is not diaphoretic.   HENT:     Head: Normocephalic and atraumatic.  Cardiovascular:     Comments: Patient denied heart and lung exam. She did participate in limited neurologic assessment, including protruding her tongue and wiggling her toes.  Pulmonary:     Effort: Pulmonary effort is normal. No respiratory distress.  Musculoskeletal:     Right lower leg: Edema present.     Left lower leg: Edema present.  Neurological:     Mental Status: She is alert. She is disoriented.  Psychiatric:     Comments:  Alert and attentive. Speech fluent. Oriented to person, not fully oriented to place or date. Exhibits visual hallucinations but remains calm and appropriate.    Patient Lines/Drains/Airways Status     Active Line/Drains/Airways     Name Placement date Placement time Site Days   Peripheral IV 03/22/24 20 G 1.88 Anterior;Right;Upper Arm 03/22/24  1152  Arm  4   PICC Double Lumen 03/20/24 Left Brachial 42 cm 0 cm 03/20/24  1631  -- 6   External Urinary Catheter 03/19/24  1600  --  7   Small Bore Feeding Tube 10 Fr. Left nare Marking at nare/corner of mouth 63 cm 03/21/24  1331  Left nare  5            Pertinent labs and imaging:      Latest Ref Rng & Units 03/26/2024  7:14 AM 03/25/2024    5:40 AM 03/24/2024    4:10 AM  CBC  WBC 4.0 - 10.5 K/uL 7.7  6.0  6.5   Hemoglobin 12.0 - 15.0 g/dL 88.2  87.4  87.7   Hematocrit 36.0 - 46.0 % 34.9  36.9  36.3   Platelets 150 - 400 K/uL 212  204  200        Latest Ref Rng & Units 03/25/2024    5:40 AM 03/24/2024    4:10 AM 03/23/2024    3:30 AM  CMP  Glucose 70 - 99 mg/dL 785  798  838   BUN 8 - 23 mg/dL 28  32  38   Creatinine 0.44 - 1.00 mg/dL 8.60  8.31  7.79   Sodium 135 - 145 mmol/L 133  136  131   Potassium 3.5 - 5.1 mmol/L 4.1  3.8  3.5   Chloride 98 - 111 mmol/L 102  104  98   CO2 22 - 32 mmol/L 21  23  23    Calcium  8.9 - 10.3 mg/dL 8.3  8.2  8.4    CBG (last 3)  Recent Labs    03/25/24 2136 03/26/24 0659 03/26/24 0746  GLUCAP  154* 183* 196*   Heparin Studies  Latest Reference Range & Units 03/26/24 07:14  Heparin Unfractionated 0.30 - 0.70 IU/mL 0.38   No results found.  ASSESSMENT/PLAN:  Assessment: Principal Problem:   Acute ischemic right MCA stroke (HCC) Active Problems:   Atrial fibrillation (HCC)   Hypertension   DM (diabetes mellitus) type II controlled with renal manifestation (HCC)   CKD (chronic kidney disease) stage 3, GFR 30-59 ml/min (HCC)   Abdominal pain, epigastric   Hemiplegia and hemiparesis following cerebral infarction affecting left non-dominant side (HCC)   Headache   Vomiting   Hyperthyroidism   Delirium   High anion gap metabolic acidosis   Lactic acidosis   Euglycemic DKA (diabetic ketoacidosis) (HCC)   Pulmonary artery hypertension (HCC)   Right ventricular dysfunction   History of recent stroke   Pulmonary embolism (HCC)   Hypercoagulable state   AKI (acute kidney injury)   Malnutrition of moderate degree   H/O ischemic right PCA stroke   Abnormal EEG  Plan:  #Encephalopathy 2/2 acute R MCA ischemic stroke #H/o R PCA stroke (09/20225) #PE, Acute Stable MRI of brain revealed an acute R MCA stroke with petechial hemorrhage of the prior R PCA stroke (09/20225). Neurology evaluation suggests an ischemic stroke. There are no new new neurologic deficits. Neurology previously recommended oral anticoagulation with Apaxiban as PO intake is tolerated. Patient has stable PE. Will discontinue heparin and will transition to Apixaban  given well-tolerated PO Aspirin  trial.  - Continue ongoing aggressive stroke management  - Discontinue IV Heparin - Restart Apixaban  5 mg BID - Patient is progressing well in PT; admissions coordinator feels she is appropriate for CIR. Family is in agreement.   #AKI Stable and improving Baseline creatinine is about ~1.0. Real function continues to improve, with creatinine decreasing from 1.39 (11/18) to 1.33 (11/19). AKI likely related to  contrast-associated kidney injury following multiple imaging studies. - Continue supportive care as renal function improves - Monitor renal function daily  - Control BP appropriately  - Continue free water 120 mL q4hr  #T2DM Stable S/p treatment of euglycemic DKA and persistent lactic acidosis. Capillary blood glucose levels have been elevated, with the most recent readings of 196 and 183.  - Continue Semglee 12 units to for basal  glycemic control  - Continue SSI TID w/ meals - Continue to monitor blood glucose levels  #Abnormal EEG - Continue Keppra 500 mg BID   #Hyperthyroidism: - Continue Methimazole  10 mg daily  - Cholestyramine has been discontinued   Best Practice: Diet: Thin VTE: IV Heparin Code: DNR/DNI  Disposition planning: Therapy Recs: CIR DISPO: Anticipated discharge in 1-3 days to CIR pending placement.  Signature:  Dwane GORMAN Glatter - MS3 [facility]  7:24 AM, 03/26/2024  On Call pager [phone]  -- The above was reviewed by me, Dr. Penne Mori; DO - PGY1.  The patient was independently evaluated by me and changes to the above note were made where appropriate.

## 2024-03-26 NOTE — Plan of Care (Signed)
 Calm and interactive at times then will become boisterous and yell out frantically and has to be calmed down.  Has some confusion and requires much redirecting.  Did have 3 back to back episodes earlier of 2-3 second pauses.  She was being cleaned up from being incont and was laughing out when tele called to report it.  VSS and she was asymptomatic and MD was notified.  No orders were given for this because she was asymptomatic and calm.  Said to notify him if it happens again.     Problem: Clinical Measurements: Goal: Will remain free from infection Outcome: Progressing   Problem: Clinical Measurements: Goal: Diagnostic test results will improve Outcome: Progressing   Problem: Clinical Measurements: Goal: Respiratory complications will improve Outcome: Progressing   Problem: Clinical Measurements: Goal: Cardiovascular complication will be avoided Outcome: Progressing   Problem: Coping: Goal: Level of anxiety will decrease Outcome: Progressing   Problem: Pain Managment: Goal: General experience of comfort will improve and/or be controlled Outcome: Progressing   Problem: Safety: Goal: Ability to remain free from injury will improve Outcome: Progressing   Problem: Education: Goal: Knowledge of secondary prevention will improve (MUST DOCUMENT ALL) Outcome: Progressing   Problem: Education: Goal: Knowledge of patient specific risk factors will improve (DELETE if not current risk factor) Outcome: Progressing

## 2024-03-27 DIAGNOSIS — I63511 Cerebral infarction due to unspecified occlusion or stenosis of right middle cerebral artery: Secondary | ICD-10-CM | POA: Diagnosis not present

## 2024-03-27 DIAGNOSIS — Z7901 Long term (current) use of anticoagulants: Secondary | ICD-10-CM | POA: Diagnosis not present

## 2024-03-27 DIAGNOSIS — Z79899 Other long term (current) drug therapy: Secondary | ICD-10-CM | POA: Diagnosis not present

## 2024-03-27 DIAGNOSIS — N179 Acute kidney failure, unspecified: Secondary | ICD-10-CM | POA: Diagnosis not present

## 2024-03-27 LAB — CBC
HCT: 33.1 % — ABNORMAL LOW (ref 36.0–46.0)
Hemoglobin: 11 g/dL — ABNORMAL LOW (ref 12.0–15.0)
MCH: 28.9 pg (ref 26.0–34.0)
MCHC: 33.2 g/dL (ref 30.0–36.0)
MCV: 87.1 fL (ref 80.0–100.0)
Platelets: 219 K/uL (ref 150–400)
RBC: 3.8 MIL/uL — ABNORMAL LOW (ref 3.87–5.11)
RDW: 15.9 % — ABNORMAL HIGH (ref 11.5–15.5)
WBC: 5 K/uL (ref 4.0–10.5)
nRBC: 0 % (ref 0.0–0.2)

## 2024-03-27 LAB — BASIC METABOLIC PANEL WITH GFR
Anion gap: 8 (ref 5–15)
BUN: 33 mg/dL — ABNORMAL HIGH (ref 8–23)
CO2: 24 mmol/L (ref 22–32)
Calcium: 8.4 mg/dL — ABNORMAL LOW (ref 8.9–10.3)
Chloride: 102 mmol/L (ref 98–111)
Creatinine, Ser: 1.22 mg/dL — ABNORMAL HIGH (ref 0.44–1.00)
GFR, Estimated: 47 mL/min — ABNORMAL LOW (ref >60)
Glucose, Bld: 242 mg/dL — ABNORMAL HIGH (ref 70–99)
Potassium: 5 mmol/L (ref 3.5–5.1)
Sodium: 134 mmol/L — ABNORMAL LOW (ref 135–145)

## 2024-03-27 LAB — GLUCOSE, CAPILLARY
Glucose-Capillary: 140 mg/dL — ABNORMAL HIGH (ref 70–99)
Glucose-Capillary: 143 mg/dL — ABNORMAL HIGH (ref 70–99)
Glucose-Capillary: 219 mg/dL — ABNORMAL HIGH (ref 70–99)
Glucose-Capillary: 225 mg/dL — ABNORMAL HIGH (ref 70–99)
Glucose-Capillary: 227 mg/dL — ABNORMAL HIGH (ref 70–99)
Glucose-Capillary: 245 mg/dL — ABNORMAL HIGH (ref 70–99)

## 2024-03-27 MED ORDER — BOOST / RESOURCE BREEZE PO LIQD CUSTOM
1.0000 | Freq: Three times a day (TID) | ORAL | Status: DC
  Administered 2024-03-27 – 2024-03-28 (×3): 1 via ORAL
  Administered 2024-03-28 – 2024-03-29 (×3): 237 mL via ORAL

## 2024-03-27 NOTE — Progress Notes (Signed)
 Calorie Count Note  48 hour calorie count ordered.Day 2 results below.  Pt consumed ~40% of her estimated needs 11/19 which is an improvement from the day before. Discussed with RN this AM and she reports that when pt is feeding herself, she eats very little and after a few bites starts to get distracted. However, RN assisted this AM with feeding and pt consumed 100% of her meal. Pt will need to have full supervision with meals as well as feeding assistance and feel that she will be able to meet her needs. Breakfast this AM alone met ~50% of her estimated needs. Will adjust supplements as pt reports that she does not like ensure. Will trial boost breeze. SLP able to upgrade pt to regular diet which will also further help with meals. Pt medically stable for discharge to CIR when approved.   Ok to remove cortrak and nocturnal feeds.   Diet: regular, thins Supplements: magic cups TID   Estimated Nutritional Needs:  Kcal:  1500-1700 kcal/d Protein:  75-90g/d  11/19 Breakfast: 200 kcal, 2.5 g protein 11/19 Lunch: 396 kcal, 27 g protein  11/19 Dinner: 25 kcal, 2g protein  Total intake: 621 kcal (41% of minimum estimated needs)  31.5 protein (42% of minimum estimated needs)  11/20 Breakfast: 739kcal, 29g protein  INTERVENTION:  Ok to discontinue cortrak feeds and tube as long as pt receives feeding assistance and redirection during meals Continue current diet as ordered per SLP Feeding assistance Encourage PO intake Automatic trays  Magic cup TID with meals, each supplement provides 290 kcal and 9 grams of protein Boost Breeze po TID, each supplement provides 250 kcal and 9 grams of protein      NUTRITION DIAGNOSIS:  Moderate Malnutrition related to acute illness (recent stroke and hospitalization) as evidenced by mild muscle depletion, percent weight loss (5% x 1 month). - remains applicable   GOAL:  Patient will meet greater than or equal to 90% of their needs -  progressing   Megan Fischer, RD, LDN, CNSC Registered Dietitian II Please reach out via secure chat

## 2024-03-27 NOTE — Plan of Care (Signed)
 In better spirits today.  Eating more.  Did eat more at breakfast and lunch.  Refused supper but said all we do around here is eat and she is so full she is miserable.  Did drink a boost breeze drink.    Problem: Education: Goal: Knowledge of General Education information will improve Description: Including pain rating scale, medication(s)/side effects and non-pharmacologic comfort measures Outcome: Progressing   Problem: Clinical Measurements: Goal: Respiratory complications will improve Outcome: Progressing   Problem: Clinical Measurements: Goal: Cardiovascular complication will be avoided Outcome: Progressing   Problem: Activity: Goal: Risk for activity intolerance will decrease Outcome: Progressing   Problem: Nutrition: Goal: Adequate nutrition will be maintained Outcome: Progressing   Problem: Coping: Goal: Level of anxiety will decrease Outcome: Progressing   Problem: Coping: Goal: Ability to adjust to condition or change in health will improve Outcome: Progressing   Problem: Fluid Volume: Goal: Ability to maintain a balanced intake and output will improve Outcome: Progressing   Problem: Education: Goal: Knowledge of secondary prevention will improve (MUST DOCUMENT ALL) Outcome: Progressing   Problem: Education: Goal: Knowledge of patient specific risk factors will improve (DELETE if not current risk factor) Outcome: Progressing

## 2024-03-27 NOTE — Progress Notes (Signed)
 HD#8 SUBJECTIVE:  Patient Summary: Megan Fischer is a 73 y.o. 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 Hospitalization has been complicated by euglycemic DKA with lactic acidosis (resolved), acute PE, and an acute R MCA infarct with petechial hemorrhage of prior R PCA infarct.  Overnight Events: None.   Interim History: Patient reports that she feels fine. She also reports that she wants a big mac. Patient reports that she knows that Thanksgiving is coming up, and she also reports knowing that today is Thursday. She also reports that she has not seen her imaginary friends Alm or Janann Flores since Monday. She reports that she is in the [facility] today. She endorses stomach burning and headaches. She reports that her pain does what it wants to do. She denies nausea, vomiting, urinary or bowel changes, acute visual changes, or shortness of breath.  OBJECTIVE:  Vital Signs: Vitals:   03/26/24 1530 03/26/24 1938 03/26/24 2347 03/27/24 0343  BP: (!) 152/99 113/64 (!) 125/109 (!) 132/118  Pulse: 73 71 81 73  Resp: 18 18 18 18   Temp: 97.6 F (36.4 C) 98.4 F (36.9 C) 98.8 F (37.1 C) 98.1 F (36.7 C)  TempSrc: Oral Oral Oral Oral  SpO2: 100% (!) 79% 100% 100%  Weight:      Height:       Supplemental O2: Room Air SpO2: 100 %  Filed Weights   03/23/24 0617 03/24/24 0500 03/25/24 0500  Weight: 81.4 kg 81.7 kg 81.8 kg    Intake/Output Summary (Last 24 hours) at 03/27/2024 9374 Last data filed at 03/27/2024 0400 Gross per 24 hour  Intake 960 ml  Output --  Net 960 ml   Net IO Since Admission: 3,274.18 mL [03/27/24 0625]  Physical Exam:  Physical Exam Constitutional:      General: She is not in acute distress.    Appearance: She is ill-appearing.  HENT:     Head:  Normocephalic and atraumatic.  Cardiovascular:     Rate and Rhythm: Normal rate. Rhythm irregular.  Pulmonary:     Effort: Pulmonary effort is normal. No respiratory distress.     Breath sounds: Normal breath sounds.  Abdominal:     Tenderness: There is no abdominal tenderness.  Skin:    General: Skin is warm and dry.  Neurological:     Mental Status: She is alert.  Psychiatric:     Comments: Alert and attentive. Speech fluent. Oriented to person, partially oriented to date recognizing today is Thursday, not fully oriented to place. Remains calm and appropriate.     Patient Lines/Drains/Airways Status     Active Line/Drains/Airways     Name Placement date Placement time Site Days   Peripheral IV 03/22/24 20 G 1.88 Anterior;Right;Upper Arm 03/22/24  1152  Arm  5   PICC Double Lumen 03/20/24 Left Brachial 42 cm 0 cm 03/20/24  1631  -- 7   External Urinary Catheter 03/19/24  1600  --  8   Small Bore Feeding Tube 10 Fr. Left nare Marking at nare/corner of mouth 63 cm 03/21/24  1331  Left nare  6            Pertinent labs and imaging:      Latest Ref Rng & Units 03/27/2024    4:20 AM 03/26/2024  7:14 AM 03/25/2024    5:40 AM  CBC  WBC 4.0 - 10.5 K/uL 5.0  7.7  6.0   Hemoglobin 12.0 - 15.0 g/dL 88.9  88.2  87.4   Hematocrit 36.0 - 46.0 % 33.1  34.9  36.9   Platelets 150 - 400 K/uL 219  212  204        Latest Ref Rng & Units 03/27/2024    4:20 AM 03/26/2024    7:14 AM 03/25/2024    5:40 AM  CMP  Glucose 70 - 99 mg/dL 757  771  785   BUN 8 - 23 mg/dL 33  31  28   Creatinine 0.44 - 1.00 mg/dL 8.77  8.66  8.60   Sodium 135 - 145 mmol/L 134  133  133   Potassium 3.5 - 5.1 mmol/L 5.0  5.1  4.1   Chloride 98 - 111 mmol/L 102  103  102   CO2 22 - 32 mmol/L 24  21  21    Calcium  8.9 - 10.3 mg/dL 8.4  8.8  8.3    CBG (last 3)  Recent Labs    03/27/24 0024 03/27/24 0430 03/27/24 0617  GLUCAP 219* 225* 245*    No results found.  ASSESSMENT/PLAN:   Assessment: Principal Problem:   Acute ischemic right MCA stroke (HCC) Active Problems:   Atrial fibrillation (HCC)   Hypertension   DM (diabetes mellitus) type II controlled with renal manifestation (HCC)   CKD (chronic kidney disease) stage 3, GFR 30-59 ml/min (HCC)   Abdominal pain, epigastric   Hemiplegia and hemiparesis following cerebral infarction affecting left non-dominant side (HCC)   Headache   Vomiting   Hyperthyroidism   Delirium   High anion gap metabolic acidosis   Lactic acidosis   Euglycemic DKA (diabetic ketoacidosis) (HCC)   Pulmonary artery hypertension (HCC)   Right ventricular dysfunction   History of recent stroke   Pulmonary embolism (HCC)   Hypercoagulable state   AKI (acute kidney injury)   Malnutrition of moderate degree   H/O ischemic right PCA stroke   Abnormal EEG  Plan: #Encephalopathy 2/2 acute R MCA ischemic stroke #H/o R PCA stroke (01/2024) Stable MRI of brain revealed an acute R MCA stroke with petechial hemorrhage of the prior R PCA stroke (01/2024). Neurology evaluation suggests an ischemic stroke. There are no new neurologic deficits.  - Continue ongoing stroke management  - Continue Apixaban  5 mg BID - Patient is progressing well in PT and OT - CIR candidacy has been supported by PM&R pending insurance prior authorization  #AKI Stable and improving Baseline creatinine is about ~1.0. Real function continues to improve, with creatinine decreasing from 1.33 (11/19) to 1.22 (11/20). Recent AKI likely related to contrast-associated kidney injury following multiple imaging studies. - Continue supportive care as renal function improves - Monitor renal function daily  - Control BP appropriately   #PE, Acute Stable Patient has stable PE.  - Continue oral anticoagulation with Apaxiban per Neuro recommendations   #T2DM Stable S/p treatment of euglycemic DKA and persistent lactic acidosis. Capillary blood glucose levels have been  elevated, with the most recent readings of 245 and 225.  - Continue Semglee 12 units to for basal glycemic control  - Continue SSI TID w/ meals - Continue to monitor blood glucose levels   #Abnormal EEG - Continue Keppra 500 mg BID    #Hyperthyroidism: - Continue Methimazole  10 mg daily    Best Practice: Diet: Thin (Dobhoff tube in place, plan to  remove soon.) IVF: Fluids: None VTE: Therapeutic DOAC Code: DNR/DNI  Disposition planning: Therapy Recs: SNF DISPO: Anticipated discharge in 1-3 days to CIR pending insurance prior authorization.  Signature:  Dwane GORMAN [facility]  6:25 AM, 03/27/2024  On Call pager [phone]

## 2024-03-27 NOTE — Progress Notes (Addendum)
 Speech Language Pathology Treatment: Dysphagia  Patient Details Name: Megan Fischer MRN: [id number] DOB: [birth year] Today's Date: 03/27/2024 Time: 1217-1225 SLP Time Calculation (min) (ACUTE ONLY): 8 min  Assessment / Plan / Recommendation Clinical Impression  Session was brief today but targeted safety and efficiency with upgraded texture now that pt's dentures are present and donned. Consumed regular solid over multiple trials with adequate mastication quality and timing without residual. There were no indications of decreased airway protection with straw sips water  consistently over trials. She needed cues to refrain from verbalizing while masticating and can become distracted easily. SLP upgraded texture to regular, continue thin and updated RN, RD, MD on secure chat thread re: her intake, Cortrak and need for full supervision due to cognitive impairments. Hopefully she will meet calorie need with upgrade, support and Cortrak removed when appropriate. Will follow up once more to ensure safety/efficiency with upgrade. ST also following for cognition. She did state she was in the hospital, had a stroke but needed cues for intellectual awareness. CIR admissions coordinator has opened case for possible admission.    HPI HPI: 73 yo female presented to [facility] with DKA, acute encephalopathy being treated via the protocol. Having nausea, vomitng and abdominal discomfort. CTH revealed no acute process. Ct chest Right lower lobe segmental pulmonary emboli. Positive for acute PE, New scattered left upper lobe pulmonary nodules measuring up to 5 mm, possibly infectious/inflammatory, but indeterminate. BSE 01/25/24 BSE on CIR, no difficulty swallowing and reg/thin and no f/u; BSE on 11/14 placed on Dysphagia 1/puree and has Cortrak.  ST f/u for diet tolerance/advancement and SLE.      SLP Plan  Continue with current plan of care          Recommendations  Diet recommendations: Regular;Thin liquid Liquids  provided via: Cup;Straw Medication Administration: Whole meds with puree Supervision: Full supervision/cueing for compensatory strategies Compensations: Slow rate;Small sips/bites;Minimize environmental distractions Postural Changes and/or Swallow Maneuvers: Seated upright 90 degrees                Rehab consult Oral care BID   Frequent or constant Supervision/Assistance Dysphagia, unspecified (R13.10)     Continue with current plan of care     Megan Fischer  03/27/2024, 1:46 PM

## 2024-03-28 DIAGNOSIS — I2699 Other pulmonary embolism without acute cor pulmonale: Secondary | ICD-10-CM | POA: Diagnosis not present

## 2024-03-28 DIAGNOSIS — E119 Type 2 diabetes mellitus without complications: Secondary | ICD-10-CM | POA: Diagnosis not present

## 2024-03-28 DIAGNOSIS — Z7985 Long-term (current) use of injectable non-insulin antidiabetic drugs: Secondary | ICD-10-CM

## 2024-03-28 DIAGNOSIS — I63511 Cerebral infarction due to unspecified occlusion or stenosis of right middle cerebral artery: Secondary | ICD-10-CM | POA: Diagnosis not present

## 2024-03-28 DIAGNOSIS — Z7901 Long term (current) use of anticoagulants: Secondary | ICD-10-CM | POA: Diagnosis not present

## 2024-03-28 LAB — CBC
HCT: 33.7 % — ABNORMAL LOW (ref 36.0–46.0)
Hemoglobin: 11 g/dL — ABNORMAL LOW (ref 12.0–15.0)
MCH: 28.5 pg (ref 26.0–34.0)
MCHC: 32.6 g/dL (ref 30.0–36.0)
MCV: 87.3 fL (ref 80.0–100.0)
Platelets: 260 K/uL (ref 150–400)
RBC: 3.86 MIL/uL — ABNORMAL LOW (ref 3.87–5.11)
RDW: 16.2 % — ABNORMAL HIGH (ref 11.5–15.5)
WBC: 6 K/uL (ref 4.0–10.5)
nRBC: 0 % (ref 0.0–0.2)

## 2024-03-28 LAB — GLUCOSE, CAPILLARY
Glucose-Capillary: 152 mg/dL — ABNORMAL HIGH (ref 70–99)
Glucose-Capillary: 219 mg/dL — ABNORMAL HIGH (ref 70–99)
Glucose-Capillary: 66 mg/dL — ABNORMAL LOW (ref 70–99)
Glucose-Capillary: 98 mg/dL (ref 70–99)

## 2024-03-28 MED ORDER — DEXTROSE 50 % IV SOLN
12.5000 g | INTRAVENOUS | Status: AC
  Administered 2024-03-28: 12.5 g via INTRAVENOUS
  Filled 2024-03-28: qty 50

## 2024-03-28 MED ORDER — ACETAMINOPHEN 325 MG PO TABS
650.0000 mg | ORAL_TABLET | Freq: Four times a day (QID) | ORAL | Status: DC | PRN
  Administered 2024-03-29: 650 mg via ORAL
  Filled 2024-03-28: qty 2

## 2024-03-28 MED ORDER — INSULIN ASPART 100 UNIT/ML IJ SOLN: 0.0000 [IU] | Freq: Every day | INTRAMUSCULAR | Status: DC

## 2024-03-28 MED ORDER — INSULIN ASPART 100 UNIT/ML IJ SOLN
0.0000 [IU] | Freq: Three times a day (TID) | INTRAMUSCULAR | Status: DC
  Administered 2024-03-29: 3 [IU] via SUBCUTANEOUS
  Administered 2024-03-29: 1 [IU] via SUBCUTANEOUS
  Filled 2024-03-28: qty 1
  Filled 2024-03-28: qty 3

## 2024-03-28 NOTE — Plan of Care (Signed)
  Problem: Clinical Measurements: Goal: Will remain free from infection Outcome: Progressing   Problem: Clinical Measurements: Goal: Diagnostic test results will improve Outcome: Progressing   Problem: Clinical Measurements: Goal: Respiratory complications will improve Outcome: Progressing   Problem: Health Behavior/Discharge Planning: Goal: Ability to manage health-related needs will improve Outcome: Progressing   Problem: Education: Goal: Knowledge of secondary prevention will improve (MUST DOCUMENT ALL) Outcome: Progressing

## 2024-03-28 NOTE — Progress Notes (Signed)
 Occupational Therapy Treatment Patient Details Name: Megan Fischer MRN: [id number] DOB: 28-Apr-1951 Today's Date: 03/28/2024   History of present illness 73 y/o F presenting to ED on 11/11 with worsening headache, restlessness, confusion x3 days. CTA significant for subsegmental PE. MRI head with R MCA CVA and petechial hemorrhage in territory of recent R PCA CVA.    PMH includes HTN, DM2, A fib on apibaxan, CKD IV, R PCA CVA with residual L visual loss, weakness, neglect, multinodular goider   OT comments  Patient demonstrating good gain with OT/PT treatment. Patient requiring less assistance with bed mobility and transfers.  Patient continues to be limited on BUE shoulder flexion but increased strength with elbow flexion/extension and grip strength.  Patient will benefit from intensive inpatient follow-up therapy, >3 hours/day.  Acute OT to continue to follow to address established goals to facilitate DC to next venue of care.        If plan is discharge home, recommend the following:  Two people to help with walking and/or transfers;Two people to help with bathing/dressing/bathroom;Assistance with cooking/housework;Direct supervision/assist for medications management;Direct supervision/assist for financial management;Assist for transportation;Help with stairs or ramp for entrance   Equipment Recommendations  Other (comment) (defer)    Recommendations for Other Services      Precautions / Restrictions Precautions Precautions: Fall;Other (comment) Recall of Precautions/Restrictions: Impaired Precaution/Restrictions Comments: L homonymous hemianopsia, watch HR Restrictions Weight Bearing Restrictions Per Provider Order: No       Mobility Bed Mobility Overal bed mobility: Needs Assistance Bed Mobility: Supine to Sit     Supine to sit: Min assist, HOB elevated     General bed mobility comments: able to move LE's off EOB with MinA to raise trunk    Transfers Overall transfer  level: Needs assistance Equipment used: Rolling walker (2 wheels) Transfers: Sit to/from Stand, Bed to chair/wheelchair/BSC Sit to Stand: Mod assist, +2 physical assistance, +2 safety/equipment     Step pivot transfers: Min assist, +2 safety/equipment     General transfer comment: ModAx2 to boost-up with cueing for hand placement. Able to step-pivot with MinA to assist with RW management and +2 for safety     Balance Overall balance assessment: Needs assistance Sitting-balance support: Feet supported, No upper extremity supported Sitting balance-Leahy Scale: Fair     Standing balance support: Bilateral upper extremity supported, During functional activity, Reliant on assistive device for balance Standing balance-Leahy Scale: Poor Standing balance comment: reliant on UE and external support                           ADL either performed or assessed with clinical judgement   ADL Overall ADL's : Needs assistance/impaired                                       General ADL Comments: patient decliend self care tasks, I already did all that    Extremity/Trunk Assessment              Vision       Perception     Praxis     Communication Communication Communication: Impaired Factors Affecting Communication: Difficulty expressing self   Cognition Arousal: Alert Behavior During Therapy: Flat affect Cognition: Cognition impaired   Orientation impairments: Time       Executive functioning impairment (select all impairments): Sequencing, Problem solving OT - Cognition Comments: increased cognition  Following commands: Impaired Following commands impaired: Follows one step commands with increased time, Follows multi-step commands inconsistently      Cueing   Cueing Techniques: Verbal cues, Tactile cues  Exercises Exercises: General Upper Extremity General Exercises - Upper Extremity Shoulder Flexion: AAROM, Both, 10  reps Elbow Flexion: Both, 10 reps, AROM, Seated Elbow Extension: AROM, Both, 10 reps, Seated    Shoulder Instructions       General Comments      Pertinent Vitals/ Pain       Pain Assessment Pain Assessment: No/denies pain  Home Living                                          Prior Functioning/Environment              Frequency  Min 2X/week        Progress Toward Goals  OT Goals(current goals can now be found in the care plan section)  Progress towards OT goals: Progressing toward goals  Acute Rehab OT Goals Patient Stated Goal: to go home OT Goal Formulation: With patient Time For Goal Achievement: 04/05/24 Potential to Achieve Goals: Good ADL Goals Pt Will Perform Grooming: standing;with min assist Pt Will Perform Upper Body Dressing: with min assist;sitting Pt Will Perform Lower Body Dressing: with mod assist;sitting/lateral leans;sit to/from stand Pt Will Transfer to Toilet: with min assist;ambulating;regular height toilet Pt/caregiver will Perform Home Exercise Program: Increased ROM;Increased strength;Both right and left upper extremity;With written HEP provided;With minimal assist Additional ADL Goal #1: Pt will perform bed mobility min A in prep for seated ADLs  Plan      Co-evaluation    PT/OT/SLP Co-Evaluation/Treatment: Yes Reason for Co-Treatment: Complexity of the patient's impairments (multi-system involvement)   OT goals addressed during session: Strengthening/ROM;Proper use of Adaptive equipment and DME      AM-PAC OT 6 Clicks Daily Activity     Outcome Measure   Help from another person eating meals?: A Little Help from another person taking care of personal grooming?: A Lot Help from another person toileting, which includes using toliet, bedpan, or urinal?: A Lot Help from another person bathing (including washing, rinsing, drying)?: A Lot Help from another person to put on and taking off regular upper body  clothing?: A Lot Help from another person to put on and taking off regular lower body clothing?: A Lot 6 Click Score: 13    End of Session Equipment Utilized During Treatment: Gait belt;Rolling walker (2 wheels)  OT Visit Diagnosis: Other abnormalities of gait and mobility (R26.89);Muscle weakness (generalized) (M62.81);Hemiplegia and hemiparesis;Other symptoms and signs involving cognitive function;Low vision, both eyes (H54.2);Unsteadiness on feet (R26.81) Hemiplegia - Right/Left: Left Hemiplegia - dominant/non-dominant: Non-Dominant Hemiplegia - caused by: Cerebral infarction   Activity Tolerance Patient tolerated treatment well   Patient Left in chair;with call bell/phone within reach;with chair alarm set   Nurse Communication Mobility status        Time: 8976-8951 OT Time Calculation (min): 25 min  Charges: OT General Charges $OT Visit: 1 Visit OT Treatments $Therapeutic Exercise: 8-22 mins  Dick Laine, OTA Acute Rehabilitation Services  Office [phone]   Jeb LITTIE Laine 03/28/2024, 1:41 PM

## 2024-03-28 NOTE — Progress Notes (Addendum)
 HD#9 SUBJECTIVE:  Patient Summary: Megan Fischer is a 73 y.o. 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 Hospitalization has been complicated by euglycemic DKA with lactic acidosis (resolved), acute PE, and an acute R MCA infarct with petechial hemorrhage of prior R PCA infarct.  Overnight Events: None.  Interim History: The patient reports that she feels well this morning and slept comfortably overnight. She continues to report a headache. She believes she is in the [employer]. She reports awareness that Thanksgiving is approaching. She is able to correctly identify objects in the room, including the TV. She denies chest pain, shortness of breath, abdominal pain, and weakness.   OBJECTIVE:  Vital Signs: Vitals:   03/28/24 0000 03/28/24 0500 03/28/24 0530 03/28/24 0736  BP: (!) 132/90  133/78 (!) 163/84  Pulse: 90  87 73  Resp: 18  17 17   Temp: 98 F (36.7 C)  97.8 F (36.6 C) 98 F (36.7 C)  TempSrc: Oral  Oral Oral  SpO2: 91%  96% 95%  Weight:  81 kg    Height:       Supplemental O2: Room Air SpO2: 95 %  Filed Weights   03/24/24 0500 03/25/24 0500 03/28/24 0500  Weight: 81.7 kg 81.8 kg 81 kg     Intake/Output Summary (Last 24 hours) at 03/28/2024 9173 Last data filed at 03/28/2024 0500 Gross per 24 hour  Intake 956 ml  Output 1550 ml  Net -594 ml   Net IO Since Admission: 1,600.18 mL [03/28/24 0826]  Physical Exam:  Physical Exam Constitutional:      General: She is not in acute distress.    Appearance: She is well-developed. She is ill-appearing.  HENT:     Head: Normocephalic and atraumatic.     Mouth/Throat:     Mouth: Mucous membranes are moist.  Cardiovascular:     Rate and Rhythm: Normal rate. Rhythm irregular.  Pulmonary:     Effort: Pulmonary  effort is normal. No respiratory distress.     Breath sounds: Normal breath sounds.  Abdominal:     General: Bowel sounds are normal.     Palpations: Abdomen is soft.     Tenderness: There is no abdominal tenderness.  Skin:    General: Skin is warm and dry.  Neurological:     Mental Status: She is alert.  Psychiatric:     Comments: Alert and attentive. Speech fluent. Oriented to person, oriented to time, not fully oriented to place. Remains calm and appropriate.     Patient Lines/Drains/Airways Status     Active Line/Drains/Airways     Name Placement date Placement time Site Days   Peripheral IV 03/22/24 20 G 1.88 Anterior;Right;Upper Arm 03/22/24  1152  Arm  6   PICC Double Lumen 03/20/24 Left Brachial 42 cm 0 cm 03/20/24  1631  -- 8   External Urinary Catheter 03/19/24  1600  --  9            Pertinent labs and imaging:      Latest Ref Rng & Units 03/28/2024    3:03 AM 03/27/2024    4:20 AM 03/26/2024    7:14 AM  CBC  WBC 4.0 - 10.5 K/uL 6.0  5.0  7.7   Hemoglobin 12.0 - 15.0 g/dL 88.9  88.9  88.2  Hematocrit 36.0 - 46.0 % 33.7  33.1  34.9   Platelets 150 - 400 K/uL 260  219  212        Latest Ref Rng & Units 03/27/2024    4:20 AM 03/26/2024    7:14 AM 03/25/2024    5:40 AM  CMP  Glucose 70 - 99 mg/dL 757  771  785   BUN 8 - 23 mg/dL 33  31  28   Creatinine 0.44 - 1.00 mg/dL 8.77  8.66  8.60   Sodium 135 - 145 mmol/L 134  133  133   Potassium 3.5 - 5.1 mmol/L 5.0  5.1  4.1   Chloride 98 - 111 mmol/L 102  103  102   CO2 22 - 32 mmol/L 24  21  21    Calcium  8.9 - 10.3 mg/dL 8.4  8.8  8.3    CBG (last 3)  Recent Labs    03/27/24 1624 03/27/24 2102 03/28/24 0618  GLUCAP 227* 143* 152*   No results found.  ASSESSMENT/PLAN:  Assessment: Principal Problem:   Acute ischemic right MCA stroke (HCC) Active Problems:   Atrial fibrillation (HCC)   Hypertension   DM (diabetes mellitus) type II controlled with renal manifestation (HCC)   CKD (chronic  kidney disease) stage 3, GFR 30-59 ml/min (HCC)   Abdominal pain, epigastric   Hemiplegia and hemiparesis following cerebral infarction affecting left non-dominant side (HCC)   Headache   Vomiting   Hyperthyroidism   Delirium   High anion gap metabolic acidosis   Lactic acidosis   Euglycemic DKA (diabetic ketoacidosis) (HCC)   Pulmonary artery hypertension (HCC)   Right ventricular dysfunction   History of recent stroke   Pulmonary embolism (HCC)   Hypercoagulable state   AKI (acute kidney injury)   Malnutrition of moderate degree   H/O ischemic right PCA stroke   Abnormal EEG   Plan: #Encephalopathy 2/2 acute R MCA ischemic stroke #H/o R PCA stroke (01/2024) Stable MRI of brain revealed an acute R MCA stroke with petechial hemorrhage of the prior R PCA stroke (01/2024). Neurology evaluation suggests an ischemic stroke. There are no new neurologic deficits.  - Continue Apixaban  5 mg BID - CIR candidacy has been supported by PM&R pending insurance prior authorization - CBC and BMP labs discontinued as we await CIR placement - Dobbhoff removed; RD and SLP assessments support safe PO intake and adequacy of nutrition with feeding assistance    #AKI Resolved Baseline creatinine is about ~1.0. Real function has improved close to baseline level and consistent with CKD4. - Continue supportive care as indicated   #PE, Acute Stable Patient has stable PE.  - Continue Apaxiban per Neuro recommendations   #T2DM Stable S/p treatment of euglycemic DKA and persistent lactic acidosis. Capillary blood glucose levels have been elevated, with the most recent readings of 152 and 219.  - Continue Semglee  12 units to for basal glycemic control  - Continue SSI TID w/ meals - Continue to monitor blood glucose levels   #Abnormal EEG Stable - Keppra  discontinued   #Hyperthyroidism: Stable - Continue Methimazole  10 mg daily    Best Practice: Diet: Regular diet, Thin IVF: Fluids:  None VTE: Therapeutic DOAC  Code: DNR/DNI  Disposition planning: Therapy Recs: CIR DISPO: Anticipated discharge in 1-3 days to CIR pending insurance prior authorization.  Signature:  Dwane GORMAN Glatter MS3 [facility]  8:26 AM, 03/28/2024  On Call pager [phone]  ====  The above was reviewed by  me, Dr. Penne Mori; DO - PGY1.  The patient was independently evaluated by me and changes to the above note were made where appropriate.

## 2024-03-28 NOTE — Progress Notes (Signed)
 Hypoglycemic Event  CBG: 66 mg/dL  Treatment: D50 25 mL (12.5 gm)  Symptoms: Sweaty  Follow-up CBG: Time: 21:46H CBG Result: 98 mg/dL  Possible Reasons for Event: Unknown  Comments/MD notified: Dr. Hadassah Ala, MD   Randall Mathew Megan Fischer [place]

## 2024-03-28 NOTE — Progress Notes (Signed)
 Daily Progress Note   Patient Name: Megan Fischer       Date: 03/28/2024 DOB: 09/11/1950  Age: 73 y.o. MRN#: [id number] Attending Physician: Megan Sick, MD Primary Care Physician: Megan Manos, MD Admit Date: 03/18/2024 Length of Stay: 9 days  Reason for Consultation/Follow-up: Disposition and Establishing goals of care  Subjective:  Subjective: Chart reviewed including recent notes from primary team, Occupational Therapy, physical therapy, speech, and nursing.  Labs today notable for hemoglobin of 11.  Patient alert and interactive today during examination.  She welcomes me into the room and introduced myself and role in care team.  Today, she is oriented to self, place, and situation.  With prompting, she is able to tell me that the plan is to go for rehab to see how much functional status she can regain.  She knows she is staying in the hospital for this (CIR) if bed can be secured for her.  She denies any complaints today.  I called and was able to speak with her son.  We reviewed clinical course, goal of inpatient rehab, understanding that she has some chronic medical conditions that are not going to go away, and long-term plan to maximize her functional status with eventual goal of being able to return home.  He is appreciative of update and support.  Denies any needs at this time.  Review of Systems Denies any complaints  Objective:   Vital Signs:  BP 132/84 (BP Location: Right Arm)   Pulse 83   Temp 98.3 F (36.8 C)   Resp 18   Ht 5' 3 (1.6 m)   Wt 81 kg   SpO2 95%   BMI 31.63 kg/m   Physical Exam: General: Alert, awake, in no acute distress.   HEENT: No bruits, no goiter, no JVD Heart: Regular rate and rhythm. No murmur appreciated. Lungs: Good air movement, clear Abdomen: Soft, nontender, nondistended, positive bowel sounds.   Ext: No significant edema Skin: Warm and dry Neuro: L side weakness   Imaging: @IMAGES @  I personally reviewed recent  imaging.   Assessment & Plan:   Assessment: 73 y.o. female  with past medical history of DM2, Afib (on eliquis ), CKD4, CVA (01/2024), and past multinodular goiter admitted on 03/18/2024 with headache and agitation.   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     Recommendations/Plan: # Complex medical decision making/goals of care:  - Plan for transition to CIR for continued rehab.  Met with patient and spoke with son via phone and goals remains clear.  -  Code Status: Limited: Do not attempt resuscitation (DNR) -DNR-LIMITED -Do Not Intubate/DNI   Prognosis: Unable to determine  # Discharge Planning: CIR  -  Discussed with: patient, son, bedside care team  Thank you for allowing the palliative care team to participate in the care Megan Fischer.  Megan Meissner, MD Palliative Care Provider PMT # [phone]  If patient remains symptomatic despite maximum doses, please call PMT at [phone] between 0700 and 1900. Outside of these hours, please call attending, as PMT does not have night coverage.    I personally spent a total of 38 minutes in the care of the patient today including preparing to see the patient, getting/reviewing separately obtained history, performing a medically appropriate exam/evaluation, counseling and educating, referring and communicating with other health care professionals, documenting clinical information in the EHR, and coordinating care.

## 2024-03-28 NOTE — Plan of Care (Signed)

## 2024-03-28 NOTE — Discharge Summary (Incomplete)
 Euglycemic DKA and Lactic Acidosis  The patient was admitted to the hospital for headache and altered mental status. The patient was found to be in Euglycemic DKA and given insulin  and fluids via Endotool for several days until her anion gap closed. Anion gap was also elevated secondary to lactic acidosis. The patient was not septic, and there were no clear signs of infarction CTA abdomen pelvis was negative for infarct.  Encephalopathy, Acute R MCA Ischemic Stroke, and History of R PCA Stroke The patient has a history of recent ischemic stroke (01/2024). During this admission, her heparin , started for management of an acute PE, was held after concerned arose for a petechial intracranial hemorrhage. Neurology was consulted. EEG was obtained to evaluate for seizure activity and showed no electrographic seizures. Long term monitoring revealed right posterior sharp waves and generalized slowing but no definite epileptiform activity. Patient did not pass the swallow evaluation and was determined unsafe for oral intake. A Cortrak feeding tube was placed, and she was maintained on tube feeds until able to receive adequate nutrition orally which started on ***. 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     Abnormal EEG Given her recent ischemic stroke, EEG evaluation was performed for possible abnormal neurologic activity. EEG showed no seizures, and long term monitoring demonstrated right posterior sharp waves and generalized slowing without definite epileptiform discharges. She remained clinically stable w/o new deficits and was continued on Keppra  500 mg twice daily for seizure prophylaxis.    Acute Pulmonary Embolism CTA chest on 11/13 showed a right lower lobe subsegmental PE. She had been on Apaxiban prior to admission, although since anticoagulation was held for several days, we do not consider this to be an anticoagulation failure because she was only taking half medication  dose during this time. She was started on Heparin  infusion, which was briefly held dur to concern of petechial hemorrhage then safely resumed. LE doppler showed no DVT. 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 A TCD bubble study was considered but deferred given low suspicion for paradoxical embolism.    Acute Kidney Injury The patient developed an acute kidney injury during hospitalization, likely multifactorial from volume depletion and repeated contrast exposure from imaging studies. Free water  at 250 mL q6h was initiated to address electrolyte abnormalities and support renal recovery. Renal function and electrolytes have been monitored closely,with gradual improvement observed.    Hypertension The patient has chronic hypertension with fluctuating BPs during admission. Given her chronic right ICA occlusion, care was taken to avoid hypotension to preserve cerebral perfusion. Long-term BP goal remains normotension, with ongoing monitoring and adjustment of therapy as needed.   Type 2 Diabetes Mellitus Chronic condition. Now stable status post earlier euglycemic DKA and lactic acidosis. Glucose levels monitored throughout admission with appropriate adjustments to maintain control.    Hyperthyroidism Chronic and stable throughout hospital admission. The patient continued on her home regimen of methimazole  and cholestyramine  with good tolerance and no evidence of thyrotoxicosis.   Atrial fibrillation Chronic, rate-controlled on Metoprolol . She was previously managed on Eloquis as an outpatient. Anticoagulation was changed to IV heparin  during admission due to the development of a PE. She has remained stable on the heparin  infusion, with plans to transition back to Eloquis at discharge.   Chronic Kidney Disease Baseline CKD with superimposed AKI, likely contrast-associated. Creatinine has down-trended toward baseline, and prior anion gap metabolic  disturbance has resolved.

## 2024-03-28 NOTE — Progress Notes (Signed)
 IP rehab admissions - We have approval for inpatient rehab admission.  I am hopeful for a bed to become available in am, Saturday.  Please have nurse call [phone] to check with charge nurse for medical clearance for admission.  Please have nurse call report to [phone]

## 2024-03-28 NOTE — Progress Notes (Signed)
 Physical Therapy Treatment Patient Details Name: Megan Fischer MRN: [id number] DOB: [birth year] Today's Date: 03/28/2024   History of Present Illness 73 y/o F presenting to ED on 11/11 with worsening headache, restlessness, confusion x3 days. CTA significant for subsegmental PE. MRI head with R MCA CVA and petechial hemorrhage in territory of recent R PCA CVA.    PMH includes HTN, DM2, A fib on apibaxan, CKD IV, R PCA CVA with residual L visual loss, weakness, neglect, multinodular goider    PT Comments  Pt received in supine and agreeable to PT/OT session to progress mobility and work towards acute goals. Pt improved in today's session by being able to ambulate 59ft forwards/backwards with RW and MinA +2 for chair follow. Pt continues to have increased difficulty moving from sit<>stand with ModAx2 required. Pt had increased awareness in today's session of L sided deficits with pt continuing to have difficulty with motor planning and sequencing. Continue to recommend >3hrs post acute rehab with acute PT to follow.     If plan is discharge home, recommend the following: Supervision due to cognitive status;Direct supervision/assist for medications management;Direct supervision/assist for financial management;Assist for transportation;A lot of help with walking and/or transfers;A lot of help with bathing/dressing/bathroom   Can travel by private vehicle      No  Equipment Recommendations  None recommended by PT       Precautions / Restrictions Precautions Precautions: Fall;Other (comment) Recall of Precautions/Restrictions: Impaired Precaution/Restrictions Comments: L homonymous hemianopsia, watch HR Restrictions Weight Bearing Restrictions Per Provider Order: No     Mobility  Bed Mobility Overal bed mobility: Needs Assistance Bed Mobility: Supine to Sit    Supine to sit: Min assist, HOB elevated    General bed mobility comments: able to move LE's off EOB with MinA to raise trunk     Transfers Overall transfer level: Needs assistance Equipment used: Rolling walker (2 wheels) Transfers: Sit to/from Stand, Bed to chair/wheelchair/BSC Sit to Stand: Mod assist, +2 physical assistance, +2 safety/equipment   Step pivot transfers: Min assist, +2 safety/equipment     General transfer comment: ModAx2 to boost-up with cueing for hand placement. Able to step-pivot with MinA to assist with RW management and +2 for safety    Ambulation/Gait Ambulation/Gait assistance: Min assist, +2 safety/equipment Gait Distance (Feet): 6 Feet Assistive device: Rolling walker (2 wheels) Gait Pattern/deviations: Step-through pattern, Decreased stride length, Trunk flexed Gait velocity: decr    General Gait Details: slow and slightly unsteady steps with heavy UE reliance. Cues for upright posture as pt tended to lean forwards. Able to step 79ft forwards and backwards with MinA for steadying assist and occasional assist to move RW     Modified Rankin (Stroke Patients Only) Modified Rankin (Stroke Patients Only) Pre-Morbid Rankin Score: Moderately severe disability Modified Rankin: Severe disability     Balance Overall balance assessment: Needs assistance Sitting-balance support: Feet supported, No upper extremity supported Sitting balance-Leahy Scale: Fair     Standing balance support: Bilateral upper extremity supported, During functional activity, Reliant on assistive device for balance Standing balance-Leahy Scale: Poor Standing balance comment: reliant on UE and external support     Communication Communication Communication: Impaired Factors Affecting Communication: Difficulty expressing self  Cognition Arousal: Alert Behavior During Therapy: Flat affect   PT - Cognitive impairments: Awareness, Attention, Sequencing, Problem solving, Safety/Judgement, Orientation   Orientation impairments: Time, Place    PT - Cognition Comments: Pt pleasant and cooperative today. Slightly  improved awareness into deficits today Following commands: Impaired  Following commands impaired: Follows one step commands with increased time, Follows multi-step commands inconsistently    Cueing Cueing Techniques: Verbal cues, Tactile cues  Exercises General Exercises - Lower Extremity Ankle Circles/Pumps: AROM, Both, 10 reps, Seated Long Arc Quad: AAROM, AROM, Both, 10 reps, Seated Heel Slides: AROM, Both, Supine, 10 reps Other Exercises Other Exercises: x2 sit<>stand with ModAx2 and RW        Pertinent Vitals/Pain Pain Assessment Pain Assessment: No/denies pain     PT Goals (current goals can now be found in the care plan section) Acute Rehab PT Goals PT Goal Formulation: Patient unable to participate in goal setting Time For Goal Achievement: 04/05/24 Potential to Achieve Goals: Fair Progress towards PT goals: Progressing toward goals    Frequency    Min 3X/week       AM-PAC PT 6 Clicks Mobility   Outcome Measure  Help needed turning from your back to your side while in a flat bed without using bedrails?: A Lot Help needed moving from lying on your back to sitting on the side of a flat bed without using bedrails?: A Lot Help needed moving to and from a bed to a chair (including a wheelchair)?: Total Help needed standing up from a chair using your arms (e.g., wheelchair or bedside chair)?: Total Help needed to walk in hospital room?: Total Help needed climbing 3-5 steps with a railing? : Total 6 Click Score: 8    End of Session Equipment Utilized During Treatment: Gait belt Activity Tolerance: Patient tolerated treatment well Patient left: in chair;with call bell/phone within reach;with chair alarm set Nurse Communication: Mobility status;Need for lift equipment PT Visit Diagnosis: Unsteadiness on feet (R26.81);Difficulty in walking, not elsewhere classified (R26.2);Other symptoms and signs involving the nervous system (R29.898);Hemiplegia and  hemiparesis Hemiplegia - Right/Left: Left Hemiplegia - dominant/non-dominant: Non-dominant Hemiplegia - caused by: Cerebral infarction     Time: 8976-8951 PT Time Calculation (min) (ACUTE ONLY): 25 min  Charges:    $Therapeutic Activity: 8-22 mins PT General Charges $$ ACUTE PT VISIT: 1 Visit                    Kate ORN, PT, DPT Secure Chat Preferred  Rehab Office [phone]  Kate BRAVO Wendolyn 03/28/2024, 12:49 PM

## 2024-03-29 ENCOUNTER — Encounter (HOSPITAL_COMMUNITY): Payer: Self-pay | Admitting: Physical Medicine & Rehabilitation

## 2024-03-29 ENCOUNTER — Inpatient Hospital Stay (HOSPITAL_COMMUNITY)
Admit: 2024-03-29 | Discharge: 2024-04-01 | DRG: 057 | Disposition: A | Discharge status: Home Health | Source: Intra-hospital | Attending: Physical Medicine & Rehabilitation | Admitting: Physical Medicine & Rehabilitation

## 2024-03-29 ENCOUNTER — Other Ambulatory Visit: Payer: Self-pay

## 2024-03-29 DIAGNOSIS — I69354 Hemiplegia and hemiparesis following cerebral infarction affecting left non-dominant side: Secondary | ICD-10-CM | POA: Diagnosis present

## 2024-03-29 DIAGNOSIS — Z79899 Other long term (current) drug therapy: Secondary | ICD-10-CM

## 2024-03-29 DIAGNOSIS — Z7985 Long-term (current) use of injectable non-insulin antidiabetic drugs: Secondary | ICD-10-CM

## 2024-03-29 DIAGNOSIS — Z886 Allergy status to analgesic agent status: Secondary | ICD-10-CM

## 2024-03-29 DIAGNOSIS — K5900 Constipation, unspecified: Secondary | ICD-10-CM | POA: Diagnosis present

## 2024-03-29 DIAGNOSIS — I2721 Secondary pulmonary arterial hypertension: Secondary | ICD-10-CM | POA: Diagnosis present

## 2024-03-29 DIAGNOSIS — E1149 Type 2 diabetes mellitus with other diabetic neurological complication: Secondary | ICD-10-CM

## 2024-03-29 DIAGNOSIS — Z7984 Long term (current) use of oral hypoglycemic drugs: Secondary | ICD-10-CM

## 2024-03-29 DIAGNOSIS — K219 Gastro-esophageal reflux disease without esophagitis: Secondary | ICD-10-CM | POA: Diagnosis present

## 2024-03-29 DIAGNOSIS — N189 Chronic kidney disease, unspecified: Secondary | ICD-10-CM

## 2024-03-29 DIAGNOSIS — D649 Anemia, unspecified: Secondary | ICD-10-CM | POA: Diagnosis not present

## 2024-03-29 DIAGNOSIS — R918 Other nonspecific abnormal finding of lung field: Secondary | ICD-10-CM | POA: Diagnosis present

## 2024-03-29 DIAGNOSIS — E1122 Type 2 diabetes mellitus with diabetic chronic kidney disease: Secondary | ICD-10-CM | POA: Diagnosis present

## 2024-03-29 DIAGNOSIS — Z8249 Family history of ischemic heart disease and other diseases of the circulatory system: Secondary | ICD-10-CM | POA: Diagnosis not present

## 2024-03-29 DIAGNOSIS — N184 Chronic kidney disease, stage 4 (severe): Secondary | ICD-10-CM | POA: Diagnosis present

## 2024-03-29 DIAGNOSIS — I129 Hypertensive chronic kidney disease with stage 1 through stage 4 chronic kidney disease, or unspecified chronic kidney disease: Secondary | ICD-10-CM | POA: Diagnosis present

## 2024-03-29 DIAGNOSIS — I2699 Other pulmonary embolism without acute cor pulmonale: Secondary | ICD-10-CM | POA: Diagnosis present

## 2024-03-29 DIAGNOSIS — R414 Neurologic neglect syndrome: Secondary | ICD-10-CM | POA: Diagnosis present

## 2024-03-29 DIAGNOSIS — Z880 Allergy status to penicillin: Secondary | ICD-10-CM

## 2024-03-29 DIAGNOSIS — D631 Anemia in chronic kidney disease: Secondary | ICD-10-CM | POA: Diagnosis present

## 2024-03-29 DIAGNOSIS — K5901 Slow transit constipation: Secondary | ICD-10-CM | POA: Diagnosis not present

## 2024-03-29 DIAGNOSIS — Z888 Allergy status to other drugs, medicaments and biological substances status: Secondary | ICD-10-CM

## 2024-03-29 DIAGNOSIS — Z885 Allergy status to narcotic agent status: Secondary | ICD-10-CM

## 2024-03-29 DIAGNOSIS — Z794 Long term (current) use of insulin: Secondary | ICD-10-CM

## 2024-03-29 DIAGNOSIS — Z823 Family history of stroke: Secondary | ICD-10-CM

## 2024-03-29 DIAGNOSIS — F05 Delirium due to known physiological condition: Secondary | ICD-10-CM | POA: Diagnosis present

## 2024-03-29 DIAGNOSIS — I69320 Aphasia following cerebral infarction: Secondary | ICD-10-CM | POA: Diagnosis not present

## 2024-03-29 DIAGNOSIS — G8114 Spastic hemiplegia affecting left nondominant side: Secondary | ICD-10-CM | POA: Diagnosis not present

## 2024-03-29 DIAGNOSIS — Z96653 Presence of artificial knee joint, bilateral: Secondary | ICD-10-CM | POA: Diagnosis present

## 2024-03-29 DIAGNOSIS — N179 Acute kidney failure, unspecified: Secondary | ICD-10-CM | POA: Diagnosis present

## 2024-03-29 DIAGNOSIS — Z6831 Body mass index (BMI) 31.0-31.9, adult: Secondary | ICD-10-CM

## 2024-03-29 DIAGNOSIS — E1165 Type 2 diabetes mellitus with hyperglycemia: Secondary | ICD-10-CM | POA: Diagnosis not present

## 2024-03-29 DIAGNOSIS — E871 Hypo-osmolality and hyponatremia: Secondary | ICD-10-CM | POA: Diagnosis present

## 2024-03-29 DIAGNOSIS — I639 Cerebral infarction, unspecified: Secondary | ICD-10-CM

## 2024-03-29 DIAGNOSIS — N183 Chronic kidney disease, stage 3 unspecified: Secondary | ICD-10-CM | POA: Diagnosis present

## 2024-03-29 DIAGNOSIS — E785 Hyperlipidemia, unspecified: Secondary | ICD-10-CM | POA: Diagnosis present

## 2024-03-29 DIAGNOSIS — I63511 Cerebral infarction due to unspecified occlusion or stenosis of right middle cerebral artery: Secondary | ICD-10-CM | POA: Diagnosis not present

## 2024-03-29 DIAGNOSIS — E0590 Thyrotoxicosis, unspecified without thyrotoxic crisis or storm: Secondary | ICD-10-CM | POA: Diagnosis present

## 2024-03-29 DIAGNOSIS — Z7901 Long term (current) use of anticoagulants: Secondary | ICD-10-CM | POA: Diagnosis not present

## 2024-03-29 DIAGNOSIS — I10 Essential (primary) hypertension: Secondary | ICD-10-CM | POA: Diagnosis not present

## 2024-03-29 DIAGNOSIS — Z7982 Long term (current) use of aspirin: Secondary | ICD-10-CM | POA: Diagnosis not present

## 2024-03-29 DIAGNOSIS — Z833 Family history of diabetes mellitus: Secondary | ICD-10-CM

## 2024-03-29 DIAGNOSIS — Z86711 Personal history of pulmonary embolism: Secondary | ICD-10-CM

## 2024-03-29 DIAGNOSIS — I4891 Unspecified atrial fibrillation: Secondary | ICD-10-CM | POA: Diagnosis present

## 2024-03-29 DIAGNOSIS — I69319 Unspecified symptoms and signs involving cognitive functions following cerebral infarction: Secondary | ICD-10-CM

## 2024-03-29 LAB — GLUCOSE, CAPILLARY
Glucose-Capillary: 130 mg/dL — ABNORMAL HIGH (ref 70–99)
Glucose-Capillary: 184 mg/dL — ABNORMAL HIGH (ref 70–99)
Glucose-Capillary: 274 mg/dL — ABNORMAL HIGH (ref 70–99)
Glucose-Capillary: 279 mg/dL — ABNORMAL HIGH (ref 70–99)
Glucose-Capillary: 122 mg/dL — ABNORMAL HIGH (ref 70–99)

## 2024-03-29 MED ORDER — BISACODYL 10 MG RE SUPP: 10.0000 mg | Freq: Every day | RECTAL | Status: DC | PRN

## 2024-03-29 MED ORDER — SODIUM CHLORIDE 0.9% FLUSH
10.0000 mL | Freq: Two times a day (BID) | INTRAVENOUS | Status: DC
  Administered 2024-03-30 – 2024-04-01 (×5): 10 mL

## 2024-03-29 MED ORDER — SENNA 8.6 MG PO TABS: 2.0000 | ORAL_TABLET | Freq: Every day | ORAL | 0 refills | Status: DC

## 2024-03-29 MED ORDER — SENNA 8.6 MG PO TABS
2.0000 | ORAL_TABLET | Freq: Every day | ORAL | Status: DC
  Administered 2024-03-31 – 2024-04-01 (×2): 17.2 mg via ORAL
  Filled 2024-03-29 (×3): qty 2

## 2024-03-29 MED ORDER — EZETIMIBE 10 MG PO TABS
10.0000 mg | ORAL_TABLET | Freq: Every day | ORAL | Status: DC
  Administered 2024-03-30 – 2024-04-01 (×3): 10 mg via ORAL
  Filled 2024-03-29 (×3): qty 1

## 2024-03-29 MED ORDER — INSULIN GLARGINE-YFGN 100 UNIT/ML ~~LOC~~ SOLN
6.0000 [IU] | Freq: Every day | SUBCUTANEOUS | Status: DC
  Administered 2024-03-29: 6 [IU] via SUBCUTANEOUS
  Filled 2024-03-29: qty 0.06

## 2024-03-29 MED ORDER — ATORVASTATIN CALCIUM 40 MG PO TABS
40.0000 mg | ORAL_TABLET | Freq: Every day | ORAL | Status: DC
  Administered 2024-03-30 – 2024-03-31 (×2): 40 mg via ORAL
  Filled 2024-03-29 (×2): qty 1

## 2024-03-29 MED ORDER — SODIUM CHLORIDE 0.9% FLUSH: 10.0000 mL | INTRAVENOUS | Status: DC | PRN

## 2024-03-29 MED ORDER — ASPIRIN 81 MG PO TBEC
81.0000 mg | DELAYED_RELEASE_TABLET | Freq: Every day | ORAL | Status: DC
  Administered 2024-03-30 – 2024-04-01 (×3): 81 mg via ORAL
  Filled 2024-03-29 (×3): qty 1

## 2024-03-29 MED ORDER — GUAIFENESIN-DM 100-10 MG/5ML PO SYRP: 5.0000 mL | ORAL_SOLUTION | Freq: Four times a day (QID) | ORAL | Status: DC | PRN

## 2024-03-29 MED ORDER — MENTHOL 3 MG MT LOZG
1.0000 | LOZENGE | OROMUCOSAL | Status: DC | PRN
Start: 2024-03-29 — End: 2024-04-01

## 2024-03-29 MED ORDER — INSULIN GLARGINE-YFGN 100 UNIT/ML ~~LOC~~ SOLN
6.0000 [IU] | Freq: Every day | SUBCUTANEOUS | Status: DC
Start: 2024-03-30 — End: 2024-04-01
  Administered 2024-03-30 – 2024-04-01 (×3): 6 [IU] via SUBCUTANEOUS
  Filled 2024-03-29 (×4): qty 0.06

## 2024-03-29 MED ORDER — ALUM & MAG HYDROXIDE-SIMETH 200-200-20 MG/5ML PO SUSP
30.0000 mL | ORAL | Status: DC | PRN
Start: 2024-03-29 — End: 2024-04-01

## 2024-03-29 MED ORDER — INSULIN ASPART 100 UNIT/ML IJ SOLN
0.0000 [IU] | Freq: Every day | INTRAMUSCULAR | Status: DC
  Administered 2024-03-31: 4 [IU] via SUBCUTANEOUS
  Filled 2024-03-29: qty 4

## 2024-03-29 MED ORDER — ENSURE MAX PROTEIN PO LIQD
11.0000 [oz_av] | Freq: Two times a day (BID) | ORAL | Status: DC
  Administered 2024-03-29 – 2024-03-30 (×2): 11 [oz_av] via ORAL

## 2024-03-29 MED ORDER — PROCHLORPERAZINE MALEATE 5 MG PO TABS: 5.0000 mg | ORAL_TABLET | Freq: Four times a day (QID) | ORAL | Status: DC | PRN

## 2024-03-29 MED ORDER — DIPHENHYDRAMINE HCL 25 MG PO CAPS: 25.0000 mg | ORAL_CAPSULE | Freq: Four times a day (QID) | ORAL | Status: DC | PRN

## 2024-03-29 MED ORDER — APIXABAN 5 MG PO TABS
5.0000 mg | ORAL_TABLET | Freq: Two times a day (BID) | ORAL | Status: DC
  Administered 2024-03-29 – 2024-04-01 (×6): 5 mg via ORAL
  Filled 2024-03-29 (×6): qty 1

## 2024-03-29 MED ORDER — METOPROLOL TARTRATE 25 MG PO TABS: 25.0000 mg | ORAL_TABLET | Freq: Two times a day (BID) | ORAL | Status: DC

## 2024-03-29 MED ORDER — POLYETHYLENE GLYCOL 3350 17 G PO PACK: 17.0000 g | PACK | Freq: Every day | ORAL | 0 refills | Status: AC | PRN

## 2024-03-29 MED ORDER — FLEET ENEMA RE ENEM: 1.0000 | ENEMA | Freq: Once | RECTAL | Status: DC | PRN

## 2024-03-29 MED ORDER — PROCHLORPERAZINE EDISYLATE 10 MG/2ML IJ SOLN: 5.0000 mg | Freq: Four times a day (QID) | INTRAMUSCULAR | Status: DC | PRN

## 2024-03-29 MED ORDER — METOPROLOL TARTRATE 25 MG PO TABS
25.0000 mg | ORAL_TABLET | Freq: Two times a day (BID) | ORAL | Status: DC
  Administered 2024-03-29 – 2024-04-01 (×6): 25 mg via ORAL
  Filled 2024-03-29 (×6): qty 1

## 2024-03-29 MED ORDER — MELATONIN 5 MG PO TABS: 5.0000 mg | ORAL_TABLET | Freq: Every evening | ORAL | Status: DC | PRN

## 2024-03-29 MED ORDER — QUETIAPINE FUMARATE 25 MG PO TABS
12.5000 mg | ORAL_TABLET | Freq: Every day | ORAL | Status: DC
  Administered 2024-03-29 – 2024-03-31 (×3): 12.5 mg via ORAL
  Filled 2024-03-29 (×3): qty 1

## 2024-03-29 MED ORDER — FAMOTIDINE 20 MG PO TABS
20.0000 mg | ORAL_TABLET | Freq: Every day | ORAL | Status: DC
  Administered 2024-03-30 – 2024-04-01 (×3): 20 mg via ORAL
  Filled 2024-03-29 (×3): qty 1

## 2024-03-29 MED ORDER — ACETAMINOPHEN 325 MG PO TABS
325.0000 mg | ORAL_TABLET | ORAL | Status: DC | PRN
  Administered 2024-03-29: 650 mg via ORAL
  Filled 2024-03-29 (×2): qty 2

## 2024-03-29 MED ORDER — PROCHLORPERAZINE 25 MG RE SUPP: 12.5000 mg | Freq: Four times a day (QID) | RECTAL | Status: DC | PRN

## 2024-03-29 MED ORDER — INSULIN ASPART 100 UNIT/ML IJ SOLN
0.0000 [IU] | Freq: Three times a day (TID) | INTRAMUSCULAR | Status: DC
  Administered 2024-03-30 – 2024-04-01 (×4): 2 [IU] via SUBCUTANEOUS
  Filled 2024-03-29 (×4): qty 2

## 2024-03-29 MED ORDER — METHIMAZOLE 10 MG PO TABS
10.0000 mg | ORAL_TABLET | Freq: Every day | ORAL | Status: DC
  Administered 2024-03-30 – 2024-04-01 (×3): 10 mg via ORAL
  Filled 2024-03-29 (×3): qty 1

## 2024-03-29 NOTE — Evaluation (Signed)
 Occupational Therapy Assessment and Plan  Patient Details  Name: Megan Fischer MRN: [id number] Date of Birth: [birth year]  OT Diagnosis: {diagnoses:3041644} Rehab Potential:   ELOS:     {CHL IP REHAB OT TIME CALCULATIONS:304400400}    Hospital Problem: Principal Problem:   Acute ischemic right MCA stroke [facility])   Past Medical History:  Past Medical History:  Diagnosis Date   Atrial fibrillation (HCC)    Chronic disease anemia 03/29/2011   Diabetes mellitus    Dyslipidemia    Hypertension    Obesities, morbid (HCC)    Osteoarthritis    Past Surgical History:  Past Surgical History:  Procedure Laterality Date   CHOLECYSTECTOMY     REPLACEMENT TOTAL KNEE BILATERAL     SHOULDER SURGERY     RIGHT SHOULDER   TUBAL LIGATION      Assessment & Plan Clinical Impression: Patient is a 73 year old female with history of HTN, T2DM, A fib, giant cell tumor of left tibia, multinodular goiter, CKD 4, recent R-PCA stroke w/CIR who was admitted on 03/18/24 with 3 day history of headache, restlessness and confusion. CT/A/P ordered due to intractable N/V in ED and showed suspicion of pyelonephritis and noted to have elevated lactate 3.9 and euglycemic DKA.   Thyroid  function studies showed t TH ,0.1 with free T4 1.96 and question of thyrotoxicosis and methimazole  added.  CTA A/P negative for ischemia or other acute findings. CT head negative for acute changes. She was treated with DKA protocol. She developed LE edema and rise in BNP. Limited 2 D echo done revealing normal ef with severely elevated PAH.  CTA chest and MRI brain recommended by cardiology due to ongoing issue with confusion and lethargy.SABRA   She was found to have submassive PE  with right heart strain and new scattered LUL  pulmonary nodules  of indeterminate nature. MRI brain revealed new acute R-MCA infarct with extensive subacute R-PCA infarct and diminished flow R-ICA c/w known occlusion. Neurology also consulted and recommended ASA  with holding of of heparin  in 1-2 days due to evidence of petechial hemorrhage at site of new stroke. Question stroke from paradoxical embolus from PE.  MRI tib/fib ordered which was negative for recurrent tumor with question of pes anserine bursitis. BLE dopplers negative for DVT. Pan CT negative for mets. LT-EEG negative for seizures. Patient transferred to CIR on 03/29/2024 .    Patient currently requires {ONJ:6958369} with {JIO:6958368} secondary to {pfejpmfzwud:6958367}.  Prior to hospitalization, patient could complete *** with {ONJ:6958369}.  Patient will benefit from skilled intervention to {benefit of skilled intervention:3041641} prior to discharge {discharge:3041642}.  Anticipate patient will require {supervision/assistance:22779} and {follow le:6958356}.      OT Evaluation Precautions/Restrictions    Pain Pain Assessment Pain Scale: 0-10 Pain Score: 1  Pain Location: Foot Pain Intervention(s): Medication (See eMAR) Home Living/Prior Functioning Home Living Family/patient expects to be discharged to:: Private residence Living Arrangements: Children Vision   Perception    Praxis   Cognition   Sensation   Motor     Trunk/Postural Assessment     Balance   Extremity/Trunk Assessment      Care Tool Care Tool Self Care Eating        Oral Care         Bathing              Upper Body Dressing(including orthotics)            Lower Body Dressing (excluding footwear)  Putting on/Taking off footwear             Care Tool Toileting Toileting activity         Care Tool Bed Mobility Roll left and right activity        Sit to lying activity        Lying to sitting on side of bed activity         Care Tool Transfers Sit to stand transfer        Chair/bed transfer         Toilet transfer         Care Tool Cognition  Expression of Ideas and Wants    Understanding Verbal and Non-Verbal Content     Memory/Recall  Ability     Refer to Care Plan for Long Term Goals  SHORT TERM GOAL WEEK 1    Recommendations for other services: {RECOMMENDATIONS FOR OTHER SERVICES:3049016}   Skilled Therapeutic Intervention ADL   Mobility    Skilled Intervention Evaluation completed (see details above) with education on OT POC and goals and individual treatment initiated with focus on functional mobility/transfers, ADL re-training,  generalized strengthening and endurance, dynamic standing balance/coordination, pt/family education and discharge planning. Pt education provided on therapy schedule and safety policy with use of chair alarm. Pt participated in goal setting. Pt participated in modified ADL task (See above for functional performance).    Discharge Criteria: Patient will be discharged from OT if patient refuses treatment 3 consecutive times without medical reason, if treatment goals not met, if there is a change in medical status, if patient makes no progress towards goals or if patient is discharged from hospital.  The above assessment, treatment plan, treatment alternatives and goals were discussed and mutually agreed upon: {Assessment/Treatment Plan Discussed/Agreed:3049017}  Leita Howell, OTR/L,CBIS  Supplemental OT - [facility] and [facility] Secure Chat Preferred   03/29/2024, 9:40 PM

## 2024-03-29 NOTE — Progress Notes (Addendum)
 HD#10 SUBJECTIVE:  Patient Summary: Megan Fischer is a 73 y.o. 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 Hospitalization has been complicated by euglycemic DKA with lactic acidosis (resolved), acute PE, and an acute R MCA infarct with petechial hemorrhage of prior R PCA infarct.   Overnight Events: The night nurse documented that the patient experienced a hypoglycemic event last night with a CBG of 66 mg/dL. She was treated with D50, after which her CBG increased to 98 mg/dL and remained stable. No further events were noted.   Interim History:  The patient reports feeling well today. She is oriented to person, place, and time.  She is oriented to person, place, and time. She denies chest pain, headache, shortness of breath, abdominal pain, or any other new concerns. The patient inquired about whether she had suffered a stroke, noting that her daughter had mentioned to her. I informed her of the acute right ischemic stroke that occurred during this hospitalization. We discussed the plan for potential transfer to CIR today. She expressed understanding and had no further questions.   OBJECTIVE:  Vital Signs: Vitals:   03/28/24 1958 03/29/24 0007 03/29/24 0356 03/29/24 0406  BP: (!) 141/64 129/69 136/86   Pulse: (!) 104 73 96   Resp: 17 17 17    Temp: 99.7 F (37.6 C) 98 F (36.7 C) 97.7 F (36.5 C)   TempSrc: Oral Oral Oral   SpO2: 100% 100% 98%   Weight:    81.3 kg  Height:       Supplemental O2: Room Air SpO2: 98 %  Filed Weights   03/25/24 0500 03/28/24 0500 03/29/24 0406  Weight: 81.8 kg 81 kg 81.3 kg     Intake/Output Summary (Last 24 hours) at 03/29/2024 0902 Last data filed at 03/29/2024 0007 Gross per 24 hour  Intake 670 ml  Output 1200 ml  Net -530 ml   Net IO Since  Admission: 1,070.18 mL [03/29/24 0902]  Physical Exam: Constitutional:      General: She is not in acute distress. HENT:     Head: Normocephalic and atraumatic.     Mouth/Throat:     Mouth: Mucous membranes are moist.  Cardiovascular:     Rate and Rhythm: Normal rate. Rhythm irregular.  Pulmonary:     Effort: Pulmonary effort is normal. No respiratory distress.     Breath sounds: Normal breath sounds.  Abdominal:     General: Bowel sounds are normal. There is no distension.     Palpations: Abdomen is soft.     Tenderness: There is no abdominal tenderness.  Skin:    General: Skin is warm and dry.     Capillary Refill: Capillary refill takes less than 2 seconds.  Neurological:     Mental Status: She is alert. Mental status is at baseline.  Psychiatric:     Comments: Patient is alert and oriented x3. Mood and affect appropriate. No agitation observed. Cognition seems improved seemed to prior days.   Patient Lines/Drains/Airways Status     Active Line/Drains/Airways     Name Placement date Placement time Site Days   PICC Double Lumen 03/20/24 Left Brachial 42 cm 0 cm 03/20/24  1631  -- 9   External Urinary Catheter 03/19/24  1600  --  10  Pertinent labs and imaging:      Latest Ref Rng & Units 03/28/2024    3:03 AM 03/27/2024    4:20 AM 03/26/2024    7:14 AM  CBC  WBC 4.0 - 10.5 K/uL 6.0  5.0  7.7   Hemoglobin 12.0 - 15.0 g/dL 88.9  88.9  88.2   Hematocrit 36.0 - 46.0 % 33.7  33.1  34.9   Platelets 150 - 400 K/uL 260  219  212        Latest Ref Rng & Units 03/27/2024    4:20 AM 03/26/2024    7:14 AM 03/25/2024    5:40 AM  CMP  Glucose 70 - 99 mg/dL 757  771  785   BUN 8 - 23 mg/dL 33  31  28   Creatinine 0.44 - 1.00 mg/dL 8.77  8.66  8.60   Sodium 135 - 145 mmol/L 134  133  133   Potassium 3.5 - 5.1 mmol/L 5.0  5.1  4.1   Chloride 98 - 111 mmol/L 102  103  102   CO2 22 - 32 mmol/L 24  21  21    Calcium  8.9 - 10.3 mg/dL 8.4  8.8  8.3    CBG  (last 3)  Recent Labs    03/28/24 2114 03/28/24 2146 03/29/24 0618  GLUCAP 66* 98 130*     No results found.  ASSESSMENT/PLAN:  Assessment: Principal Problem:   Acute ischemic right MCA stroke (HCC) Active Problems:   Atrial fibrillation (HCC)   Hypertension   DM (diabetes mellitus) type II controlled with renal manifestation (HCC)   CKD (chronic kidney disease) stage 3, GFR 30-59 ml/min (HCC)   Abdominal pain, epigastric   Hemiplegia and hemiparesis following cerebral infarction affecting left non-dominant side (HCC)   Headache   Vomiting   Hyperthyroidism   Delirium   High anion gap metabolic acidosis   Lactic acidosis   Euglycemic DKA (diabetic ketoacidosis) (HCC)   Pulmonary artery hypertension (HCC)   Right ventricular dysfunction   History of recent stroke   Pulmonary embolism (HCC)   Hypercoagulable state   AKI (acute kidney injury)   Malnutrition of moderate degree   H/O ischemic right PCA stroke   Abnormal EEG   Plan: #Encephalopathy 2/2 acute R MCA ischemic stroke #H/o R PCA stroke (01/2024) Stable MRI of brain revealed an acute R MCA stroke with petechial hemorrhage of the prior R PCA stroke (01/2024). Neurology evaluation suggests an ischemic stroke. There are no new neurologic deficits.  - Continue Apixaban  5 mg BID - CIR candidacy has been supported by PM&R pending insurance prior authorization - Dobbhoff removed; RD and SLP assessments support safe PO intake and adequacy of nutrition with feeding assistance   #Hypoglycemic Episode Stable Overnight, the patient had a hypoglycemic episode with CBG 66 mg/dL. She was treated with D50 with improvement to 98 mg/dL and remained stable. - Monitor for recurrent hypoglycemia - Semglee  adjusted to 6 units (previously was on 12 units) - Adjust regimen as needed   #AKI Resolved Baseline creatinine is about ~1.0. Real function has improved close to baseline level and consistent with CKD4. - Continue  supportive care as indicated   #PE, Acute Stable Patient is stable, will continue anticoagulation.  - Continue Apaxiban per Neuro recommendations   #T2DM Stable S/p treatment of euglycemic DKA and persistent lactic acidosis. Capillary blood glucose levels have been elevated, with the most recent readings of 130 and 98.  - Semglee  adjusted to 6 units (  previously was on 12 units) - Continue SSI TID w/ meals - Continue to closely monitor blood glucose levels   #Abnormal EEG Stable - Keppra  discontinued   #Hyperthyroidism: Stable - Continue Methimazole  10 mg daily    Best Practice: Diet: Regular diet, Thin IVF: Fluids: None VTE: Therapeutic DOAC  Code: DNR/DNI  Disposition planning: Therapy Recs: CIR, DME:  DISPO: Anticipated discharge today or tomorrow to CIR pending bed availability.  Signature:  Dwane GORMAN [facility]  9:02 AM, 03/29/2024  On Call pager [phone]  Attestation for Student Documentation:  I personally was present and re-performed the history, physical exam and medical decision-making activities of this service and have verified that the service and findings are accurately documented in the student's note.  Harrie Bruckner, DO 03/29/2024, 9:42 AM

## 2024-03-29 NOTE — Discharge Summary (Signed)
 Name: Megan Fischer MRN: [id number] DOB: 1950/07/05 73 y.o. PCP: Megan Manos, MD  Date of Admission: 03/18/2024  1:57 PM Date of Discharge: 03/29/2024 Attending Physician: Dr. Jone Dauphin  Discharge Diagnosis: 1. Principal Problem:   Acute ischemic right MCA stroke (HCC) Active Problems:   Atrial fibrillation (HCC)   Hypertension   DM (diabetes mellitus) type II controlled with renal manifestation (HCC)   CKD (chronic kidney disease) stage 3, GFR 30-59 ml/min (HCC)   Abdominal pain, epigastric   Hemiplegia and hemiparesis following cerebral infarction affecting left non-dominant side (HCC)   Headache   Vomiting   Hyperthyroidism   Delirium   High anion gap metabolic acidosis   Lactic acidosis   Euglycemic DKA (diabetic ketoacidosis) (HCC)   Pulmonary artery hypertension (HCC)   Right ventricular dysfunction   History of recent stroke   Pulmonary embolism (HCC)   Hypercoagulable state   AKI (acute kidney injury)   Malnutrition of moderate degree   H/O ischemic right PCA stroke   Abnormal EEG  Discharge Medications: Allergies as of 03/29/2024       Reactions   Aspirin  Nausea Only   Patient stated to neuro that it caused an upset stomach   Codeine Other (See Comments)   Unknown    Fentanyl  Other (See Comments)   hypoxia   Oxycodone Hcl Other (See Comments)   Unknown    Penicillins Other (See Comments)   Unknown    Chlorhexidine Gluconate Rash        Medication List     STOP taking these medications    glimepiride  1 MG tablet Commonly known as: AMARYL    Jardiance  25 MG Tabs tablet Generic drug: empagliflozin    metoprolol  succinate 50 MG 24 hr tablet Commonly known as: TOPROL -XL       TAKE these medications    acetaminophen  325 MG tablet Commonly known as: TYLENOL  Take 1-2 tablets (325-650 mg total) by mouth every 4 (four) hours as needed for mild pain (pain score 1-3).   amLODipine  2.5 MG tablet Commonly known as: NORVASC  Take 1  tablet (2.5 mg total) by mouth daily.   apixaban  5 MG Tabs tablet Commonly known as: Eliquis  Take 1 tablet (5 mg total) by mouth 2 (two) times daily.   aspirin  EC 81 MG tablet Take 1 tablet (81 mg total) by mouth daily. Swallow whole.   benazepril  20 MG tablet Commonly known as: LOTENSIN  TAKE 1 TABLET BY MOUTH DAILY   Blood Glucose Monitoring Suppl Devi 1 each by Does not apply route as directed. Dispense based on patient and insurance preference. Use up to four times daily as directed. (FOR ICD-10 E10.9, E11.9).   BLOOD GLUCOSE TEST STRIPS Strp 1 each by Does not apply route as directed. Dispense based on patient and insurance preference. Use up to four times daily as directed. (FOR ICD-10 E10.9, E11.9).   CALCIUM  PO Take 1 tablet by mouth 2 (two) times daily.   camphor-menthol  lotion Commonly known as: SARNA Apply topically as needed for itching.   celecoxib  200 MG capsule Commonly known as: CeleBREX  Take 1 capsule (200 mg total) by mouth daily as needed for moderate pain (pain score 4-6).   diclofenac  Sodium 1 % Gel Commonly known as: VOLTAREN  Apply 2 g topically 4 (four) times daily. To both knees   DULoxetine  60 MG capsule Commonly known as: CYMBALTA  Take 60 mg by mouth daily.   ezetimibe  10 MG tablet Commonly known as: ZETIA  Take 1 tablet (10 mg total) by  mouth daily.   famotidine  20 MG tablet Commonly known as: PEPCID  Take 1 tablet (20 mg total) by mouth daily.   ferrous sulfate  325 (65 FE) MG EC tablet Take 1 tablet (325 mg total) by mouth 2 (two) times daily.   gabapentin  100 MG capsule Commonly known as: NEURONTIN  Take 1 capsule (100 mg total) by mouth 3 (three) times daily. What changed:  how much to take when to take this additional instructions   Lancet Device Misc 1 each by Does not apply route as directed. Dispense based on patient and insurance preference. Use up to four times daily as directed. (FOR ICD-10 E10.9, E11.9).   Lancets Misc 1  each by Does not apply route as directed. Dispense based on patient and insurance preference. Use up to four times daily as directed. (FOR ICD-10 E10.9, E11.9).   lidocaine  5 % Commonly known as: LIDODERM  Place 2 patches onto the skin daily. Remove & Discard patch within 12 hours or as directed by MD   methimazole  5 MG tablet Commonly known as: TAPAZOLE  TAKE 1 TABLET BY MOUTH DAILY   methocarbamol  500 MG tablet Commonly known as: ROBAXIN  Take 1 tablet (500 mg total) by mouth every 6 (six) hours as needed for muscle spasms.   metoprolol  tartrate 25 MG tablet Commonly known as: LOPRESSOR  Take 1 tablet (25 mg total) by mouth 2 (two) times daily.   multivitamin with minerals tablet Take 1 tablet by mouth daily.   Ozempic  (2 MG/DOSE) 8 MG/3ML Sopn Generic drug: Semaglutide  (2 MG/DOSE) INJECT SUBCUTANEOUSLY 2 MG EVERY WEEK   polyethylene glycol 17 g packet Commonly known as: MIRALAX  / GLYCOLAX  Take 17 g by mouth daily as needed for mild constipation.   rosuvastatin  20 MG tablet Commonly known as: CRESTOR  Take 1 tablet (20 mg total) by mouth daily after supper.   senna 8.6 MG Tabs tablet Commonly known as: SENOKOT Take 2 tablets (17.2 mg total) by mouth daily. Start taking on: March 30, 2024       Disposition and follow-up:   [name] was discharged from [facility] in Stable condition. [facility]. At the [facility] follow up visit please address:  1.  Right MCA Stroke - Recommend outpatient neurology follow-up for post R MCA stroke care and secondary prevention. Continue PT/OT/SLP as needed and ensure close PCP follow-up for risk factor management.   2.  Pulmonary Embolism - Recommend outpatient follow up for management for her recent PE. Emphasize importance of taking Eliquis  as prescribed, ensuring correct dose and regimen for continued anticoagulation and prevention of recurrence.   3. T2DM with Renal  Manifestation / Euglycemic DKA / AKI Superimposed on CKD - Recommend close outpatient follow-up for management of her T2DM and CKD. Given her recent episode of euglycemic DKA and AKI on CKD, she will need careful review of her diabetes regimen, renal functioning, and adjustments of medications as appropriate to prevent recurrence. Would avoid SGLT2i medicines in the future. Discharged on metformin  and ozempic . Held jardiance  and glimepride (one episode mild hypoglycemia after she transitioned from tube to self-feeds).  4. Afib - discharged metoprolol  and apixaban    Follow-up Appointments:  Follow-up Information     Lucien Orren SAILOR, PA-C Follow up.   Specialty: Cardiology Why: Fischer follow-up with Cardiology scheduled for 04/11/2024 at 10:55am. Please arrive 20 minutes early for check-in. If this date/ time does not work for you, please call our office to reschedule. Contact information: [address] [place] [address] [phone]                 [facility] Course by problem list: Megan Fischer is a 73 y.o. 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 The patient was found to be in Euglycemic DKA and given insulin  and fluids via Endotool for several days until her anion gap closed. Anion gap was also elevated secondary to lactic acidosis. The patient was not septic, and there were no clear signs of infarction CTA abdomen pelvis was negative for infarct.  Encephalopathy, Acute R MCA Ischemic Stroke, and History of R PCA Stroke The patient has a history of recent ischemic stroke  (01/2024). During this admission, heparin  was started for management of an acute PE, and was held after concerned arose for a petechial intracranial hemorrhage. Neurology was consulted. EEG was obtained to evaluate for seizure activity and showed no electrographic seizures. Long term monitoring revealed right posterior sharp waves and generalized slowing but no definite epileptiform activity. She remained clinically stable w/o new deficits and was continued on Keppra  500 mg twice daily for seizure prophylaxis. Patient did not pass the swallow evaluation and was determined unsafe for oral intake. A Dobbhoff feeding tube was placed, and she was maintained on tube feeds until able to receive adequate nutrition. 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      Acute Pulmonary Embolism CTA chest on 03/20/24 showed a right lower lobe subsegmental PE. She had been on Apaxiban prior to admission, although since anticoagulation was held for several days, we do not consider this to be an anticoagulation failure because she was only taking half medication dose during this time. She was started on Heparin  infusion, which was briefly held dur to concern of petechial hemorrhage then safely resumed. LE doppler showed no DVT. 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 A TCD bubble study was considered but deferred given low suspicion for paradoxical embolism. Venous ultrasound of the lower extremities showed no evidence of deep vein thrombosis.   Acute Kidney Injury The patient developed an acute kidney injury during hospitalization, likely multifactorial from volume depletion and repeated contrast exposure from imaging studies. Renal function and electrolytes have been monitored closely, with gradual improvement observed.    Hypertension The patient has chronic hypertension with fluctuating BPs during admission. Given her chronic right ICA occlusion,  permissive hypertension was allowed to avoid hypotension to preserve cerebral perfusion. Long-term BP goal remains normotension, with ongoing monitoring and adjustment of therapy as needed.   Type 2 Diabetes Mellitus Chronic condition. Now stable status post earlier euglycemic DKA and lactic acidosis. Patient exhibited one hypoglycemic episode on 03/28/24, that was managed with D50, then blood glucose subsequently improved. Glucose levels monitored throughout admission with appropriate adjustments to maintain control.    Hyperthyroidism Chronic and stable throughout Fischer admission. The patient continued on her home regimen of methimazole  and cholestyramine  with good tolerance and no evidence of thyrotoxicosis.   Atrial fibrillation Chronic, rate-controlled on Metoprolol . She was previously managed on Eloquis as an outpatient.  Anticoagulation was changed to IV heparin  during admission due to the development of a PE. She has remained stable on the heparin  infusion, with plans to transition back to Eloquis at discharge.   Chronic Kidney Disease Baseline CKD with superimposed AKI, likely contrast-associated. Creatinine has down-trended toward baseline, and prior anion gap metabolic disturbance has resolved.     Stable chronic medical conditions: Atrial Fibrillation Hypertension T2DM CKD Hyperthyroidism Pulmonary Arterial Hypertension Right Ventricular Dysfunction Hypercoagulable state History of R PCA Stroke Hemiplegia and Hemiparesis following Cerebral Infarction affecting Left Non-dominant Side  Subjective  Overnight Events The night nurse documented that the patient experienced a hypoglycemic event last night with a CBG of 66 mg/dL. She was treated with D50, after which her CBG increased to 98 mg/dL and remained stable. No further events were noted.   Interim History The patient reports feeling well today. She is oriented to person, place, and time.  She is oriented to person,  place, and time. She denies chest pain, headache, shortness of breath, abdominal pain, or any other new concerns.   The patient inquired about whether she had suffered a stroke, noting that her daughter had mentioned to her. I informed her of the acute right ischemic stroke that occurred during this hospitalization. We discussed the plan for potential transfer to CIR today. She expressed understanding and had no further questions.   Discharge Exam:   BP (!) 152/64 (BP Location: Right Arm)   Pulse 91   Temp 97.8 F (36.6 C) (Oral)   Resp 18   Ht 5' 3 (1.6 m)   Wt 81.3 kg   SpO2 100%   BMI 31.75 kg/m   Discharge exam:  Physical Exam Constitutional:      General: She is not in acute distress. HENT:     Head: Normocephalic and atraumatic.     Mouth/Throat:     Mouth: Mucous membranes are moist.  Cardiovascular:     Rate and Rhythm: Normal rate. Rhythm irregular.  Pulmonary:     Effort: Pulmonary effort is normal. No respiratory distress.     Breath sounds: Normal breath sounds.  Abdominal:     General: Bowel sounds are normal. There is no distension.     Palpations: Abdomen is soft.     Tenderness: There is no abdominal tenderness.  Skin:    General: Skin is warm and dry.     Capillary Refill: Capillary refill takes less than 2 seconds.  Neurological:     Mental Status: She is alert. Mental status is at baseline.  Psychiatric:     Comments: Patient is alert and oriented x3. Mood and affect appropriate. No agitation observed. Cognition seems improved seemed to prior days.    Pertinent Labs, Studies, and Procedures:     Latest Ref Rng & Units 03/28/2024    3:03 AM 03/27/2024    4:20 AM 03/26/2024    7:14 AM  CBC  WBC 4.0 - 10.5 K/uL 6.0  5.0  7.7   Hemoglobin 12.0 - 15.0 g/dL 88.9  88.9  88.2   Hematocrit 36.0 - 46.0 % 33.7  33.1  34.9   Platelets 150 - 400 K/uL 260  219  212        Latest Ref Rng & Units 03/27/2024    4:20 AM 03/26/2024    7:14 AM 03/25/2024     5:40 AM  CMP  Glucose 70 - 99 mg/dL 757  771  785   BUN 8 - 23 mg/dL 33  31  28   Creatinine 0.44 - 1.00 mg/dL 8.77  8.66  8.60   Sodium 135 - 145 mmol/L 134  133  133   Potassium 3.5 - 5.1 mmol/L 5.0  5.1  4.1   Chloride 98 - 111 mmol/L 102  103  102   CO2 22 - 32 mmol/L 24  21  21    Calcium  8.9 - 10.3 mg/dL 8.4  8.8  8.3    CBG (last 3)  Recent Labs    03/28/24 2146 03/29/24 0618 03/29/24 1111  GLUCAP 98 130* 184*   ECHOCARDIOGRAM COMPLETE Result Date: 03/19/2024    ECHOCARDIOGRAM REPORT   Patient Name:   Megan Fischer Date of Exam: 03/19/2024 Medical Rec #:  [id number]     Height:       63.0 in Accession #:    [id number]    Weight:       178.0 lb Date of Birth:  August 14, 1950    BSA:          1.840 m Patient Age:    73 years      BP:           132/90 mmHg Patient Gender: F             HR:           95 bpm. Exam Location:  Inpatient Procedure: 2D Echo (Both Spectral and Color Flow Doppler were utilized during            procedure). STAT ECHO Indications:    acute diastolic chf  History:        Patient has prior history of Echocardiogram examinations, most                 recent 01/21/2024. Chronic kidney disease, Arrythmias:Atrial                 Fibrillation; Risk Factors:Hypertension, Diabetes and                 Dyslipidemia.  Sonographer:    Tinnie Barefoot RDCS Referring Phys: [id number] JEFFREY C HATCHER IMPRESSIONS  1. Left ventricular ejection fraction, by estimation, is 50 to 55%. The left ventricle has low normal function. The left ventricle has no regional wall motion abnormalities. Left ventricular diastolic function could not be evaluated.  2. Right ventricular systolic function is mildly reduced. The right ventricular size is normal. There is severely elevated pulmonary artery systolic pressure.  3. Left atrial size was moderately dilated.  4. The mitral valve is degenerative. Mild mitral valve regurgitation. No evidence of mitral stenosis.  5. The aortic valve is normal in structure.  Aortic valve regurgitation is not visualized. No aortic stenosis is present.  6. The inferior vena cava is normal in size with greater than 50% respiratory variability, suggesting right atrial pressure of 3 mmHg. FINDINGS  Left Ventricle: Left ventricular ejection fraction, by estimation, is 50 to 55%. The left ventricle has low normal function. The left ventricle has no regional wall motion abnormalities. The left ventricular internal cavity size was normal in size. There is no left ventricular hypertrophy. Left ventricular diastolic function could not be evaluated. Right Ventricle: The right ventricular size is normal. No increase in right ventricular wall thickness. Right ventricular systolic function is mildly reduced. There is severely elevated pulmonary artery systolic pressure. The tricuspid regurgitant velocity is 3.49 m/s, and with an assumed right atrial pressure of 15 mmHg, the estimated right ventricular systolic pressure is 63.7 mmHg. Left Atrium:  Left atrial size was moderately dilated. Right Atrium: Right atrial size was normal in size. Pericardium: There is no evidence of pericardial effusion. Mitral Valve: The mitral valve is degenerative in appearance. Mild mitral annular calcification. Mild mitral valve regurgitation. No evidence of mitral valve stenosis. Tricuspid Valve: The tricuspid valve is normal in structure. Tricuspid valve regurgitation is mild . No evidence of tricuspid stenosis. Aortic Valve: The aortic valve is normal in structure. Aortic valve regurgitation is not visualized. No aortic stenosis is present. Pulmonic Valve: The pulmonic valve was normal in structure. Pulmonic valve regurgitation is mild. No evidence of pulmonic stenosis. Aorta: The aortic root is normal in size and structure. Venous: The inferior vena cava is normal in size with greater than 50% respiratory variability, suggesting right atrial pressure of 3 mmHg. IAS/Shunts: The atrial septum is grossly normal.  LEFT  VENTRICLE PLAX 2D LVIDd:         4.90 cm LVIDs:         3.30 cm LV PW:         1.00 cm LV IVS:        0.90 cm LVOT diam:     2.10 cm LV SV:         42 LV SV Index:   23 LVOT Area:     3.46 cm  LEFT ATRIUM           Index        RIGHT ATRIUM           Index LA diam:      4.00 cm 2.17 cm/m   RA Area:     13.30 cm LA Vol (A4C): 61.7 ml 33.53 ml/m  RA Volume:   30.00 ml  16.30 ml/m  AORTIC VALVE LVOT Vmax:   62.60 cm/s LVOT Vmean:  40.900 cm/s LVOT VTI:    0.120 m  AORTA Ao Root diam: 2.70 cm Ao Asc diam:  3.10 cm TRICUSPID VALVE TR Peak grad:   48.7 mmHg TR Vmax:        349.00 cm/s  SHUNTS Systemic VTI:  0.12 m Systemic Diam: 2.10 cm Morene Brownie Electronically signed by Morene Brownie Signature Date/Time: 03/19/2024/6:12:10 PM    Final    US  EKG SITE RITE Result Date: 03/19/2024 If Site Rite image not attached, placement could not be confirmed due to current cardiac rhythm.  DG CHEST PORT 1 VIEW Result Date: 03/18/2024 EXAM: 1 VIEW(S) XRAY OF THE CHEST 03/18/2024 11:28:44 PM COMPARISON: 02/18/2024 CLINICAL HISTORY: Chest pain. FINDINGS: LUNGS AND PLEURA: Mild eventration of the hemidiaphragm. No focal pulmonary opacity. No pulmonary edema. No pleural effusion. No pneumothorax. HEART AND MEDIASTINUM: No acute abnormality of the cardiac and mediastinal silhouettes. BONES AND SOFT TISSUES: Postsurgical changes involving the right shoulder. No acute osseous abnormality. IMPRESSION: 1. No acute cardiopulmonary findings. Electronically signed by: Pinkie Pebbles MD 03/18/2024 11:34 PM EST RP Workstation: [id number]   CT ABDOMEN PELVIS W CONTRAST Result Date: 03/18/2024 CLINICAL DATA:  Acute abdominal pain EXAM: CT ABDOMEN AND PELVIS WITH CONTRAST TECHNIQUE: Multidetector CT imaging of the abdomen and pelvis was performed using the standard protocol following bolus administration of intravenous contrast. RADIATION DOSE REDUCTION: This exam was performed according to the departmental dose-optimization  program which includes automated exposure control, adjustment of the mA and/or kV according to patient size and/or use of iterative reconstruction technique. CONTRAST:  75mL OMNIPAQUE  IOHEXOL  350 MG/ML SOLN COMPARISON:  02/18/2024 FINDINGS: Lower chest: No acute pleural or parenchymal lung disease. Hepatobiliary: No focal  liver abnormality is seen. Status post cholecystectomy. No biliary dilatation. Pancreas: Unremarkable. No pancreatic ductal dilatation or surrounding inflammatory changes. Spleen: Normal in size without focal abnormality. Adrenals/Urinary Tract: Heterogeneous areas of decreased cortical enhancement are seen within the dorsal aspect of the left kidney, which could reflect acute pyelonephritis. Please correlate with urinalysis. There are other scattered areas of cortical scarring within the bilateral kidneys. Small simple appearing renal cortical cysts do not require specific imaging follow-up. The adrenals and bladder are unremarkable. Stomach/Bowel: No bowel obstruction or ileus. No bowel wall thickening or inflammatory change. Small hiatal hernia. Vascular/Lymphatic: Aortic atherosclerosis. No enlarged abdominal or pelvic lymph nodes. Reproductive: Uterus and bilateral adnexa are unremarkable. Other: No free fluid or free intraperitoneal gas. No abdominal wall hernia. Musculoskeletal: No acute or destructive bony abnormalities. Severe spondylosis at the L3-4 level. Reconstructed images demonstrate no additional findings. IMPRESSION: 1. Heterogeneous areas of decreased cortical enhancement within the left kidney, suspicious for pyelonephritis. Please correlate with urinalysis. 2. Otherwise no acute intra-abdominal or intrapelvic process. 3. Small hiatal hernia. 4.  Aortic Atherosclerosis (ICD10-I70.0). Electronically Signed   By: Ozell Daring M.D.   On: 03/18/2024 18:58   CT Head Wo Contrast Result Date: 03/18/2024 CLINICAL DATA:  Altered level of consciousness EXAM: CT HEAD WITHOUT CONTRAST  TECHNIQUE: Contiguous axial images were obtained from the base of the skull through the vertex without intravenous contrast. RADIATION DOSE REDUCTION: This exam was performed according to the departmental dose-optimization program which includes automated exposure control, adjustment of the mA and/or kV according to patient size and/or use of iterative reconstruction technique. COMPARISON:  03/18/2024 at 4 p.m. FINDINGS: Brain: Stable chronic right PCA territory infarct with developing encephalomalacia. No evidence of acute infarct or hemorrhage. Lateral ventricles and midline structures are stable. No acute extra-axial fluid collections. No mass effect. Vascular: No hyperdense vessel or unexpected calcification. Skull: Normal. Negative for fracture or focal lesion. Sinuses/Orbits: No acute finding. Other: None. IMPRESSION: 1. Stable sequela of chronic right PCA territory infarct. 2. No acute intracranial process. Electronically Signed   By: Ozell Daring M.D.   On: 03/18/2024 18:54   CT Head Wo Contrast Result Date: 03/18/2024 CLINICAL DATA:  Altered level of consciousness, headache EXAM: CT HEAD WITHOUT CONTRAST TECHNIQUE: Contiguous axial images were obtained from the base of the skull through the vertex without intravenous contrast. RADIATION DOSE REDUCTION: This exam was performed according to the departmental dose-optimization program which includes automated exposure control, adjustment of the mA and/or kV according to patient size and/or use of iterative reconstruction technique. COMPARISON:  02/18/2024 FINDINGS: Examination is significantly limited by patient motion throughout the exam, despite multiple repeat imaging attempts. Brain: Continued evolution of the prior right PCA territory infarct. No evidence of hemorrhagic transformation. Stable chronic small-vessel ischemic changes within the right thalamus and periventricular white matter. No evidence of acute infarct or hemorrhage. Lateral ventricles  and remaining midline structures are stable. No acute extra-axial fluid collections. No mass effect. Vascular: No hyperdense vessel or unexpected calcification. Skull: Normal. Negative for fracture or focal lesion. Sinuses/Orbits: No acute finding. Other: None. IMPRESSION: 1. Limited study due to patient motion throughout the exam. 2. Continued evolution of the chronic right PCA territory infarct noted previously. No evidence of hemorrhagic transformation. 3. Otherwise no acute intracranial process. Electronically Signed   By: Ozell Daring M.D.   On: 03/18/2024 16:14    Discharge Instructions     Diet - low sodium heart healthy   Complete by: As directed    Increase  activity slowly   Complete by: As directed       Signed: Merilee Dwane RAMAN, Medical Student Internal Medicine Resident  03/29/2024, 12:15 PM  On Call pager: [phone]  Attestation for Student Documentation:  I personally was present and re-performed the history, physical exam and medical decision-making activities of this service and have verified that the service and findings are accurately documented in the student's note.  Harrie Bruckner, DO 03/29/2024, 12:54 PM

## 2024-03-29 NOTE — Progress Notes (Signed)
 IP rehab admissions - patient has been cleared by rehab MD and is medically stable for CIR admission today.  Bed available and will admit to inpatient rehab today.  [phone]

## 2024-03-29 NOTE — H&P (Shared)
 Physical Medicine and Rehabilitation Admission H&P    Chief Complaint  Patient presents with   Functional deficits due to stroke and PE   Weakness and left inattention    HPI: Megan Fischer is a 73 year old female with history of HTN, T2DM, A fib, giant cell tumor of left tibia, multinodular goiter, CKD 4, recent R-PCA stroke w/CIR who was admitted on 03/18/24 with 3 day history of headache, restlessness and confusion. CT/A/P ordered due to intractable N/V in ED and showed suspicion of pyelonephritis and noted to have elevated lactate 3.9 and euglycemic DKA.   Thyroid  function studies showed t TH ,0.1 with free T4 1.96 and question of thyrotoxicosis and methimazole  added.  CTA A/P negative for ischemia or other acute findings. CT head negative for acute changes. She was treated with DKA protocol. She developed LE edema and rise in BNP. Limited 2 D echo done revealing normal ef with severely elevated PAH.  CTA chest and MRI brain recommended by cardiology due to ongoing issue with confusion and lethargy.Megan Fischer  She was found to have submassive PE  with right heart strain and new scattered LUL  pulmonary nodules  of indeterminate nature. MRI brain revealed new acute R-MCA infarct with extensive subacute R-PCA infarct and diminished flow R-ICA c/w known occlusion. Neurology also consulted and recommended ASA with holding of of heparin  in 1-2 days due to evidence of petechial hemorrhage at site of new stroke. Question stroke from paradoxical embolus from PE.  MRI tib/fib ordered which was negative for recurrent tumor with question of pes anserine bursitis. BLE dopplers negative for DVT. Pan CT negative for mets. LT-EEG negative for seizures.  GOC discussed with family who confirmed DNR and treating treatable pending outcomes.  Mentation has improved and she has been advanced to regular. Heparin  added per stroke protocol and eliquis  resumed. Mentation improving but she continues to have left inattention,  cognitive deficits, motor planning deficits and weakness. She requires min to mod assist with mobility and ADLs. CIR recommended due to functional decline.     ROS   Past Medical History:  Diagnosis Date   Atrial fibrillation (HCC)    Chronic disease anemia 03/29/2011   Diabetes mellitus    Dyslipidemia    Hypertension    Obesities, morbid (HCC)    Osteoarthritis     Past Surgical History:  Procedure Laterality Date   CHOLECYSTECTOMY     REPLACEMENT TOTAL KNEE BILATERAL     SHOULDER SURGERY     RIGHT SHOULDER   TUBAL LIGATION      Family History  Problem Relation Age of Onset   Heart attack Mother    Stroke Mother    Diabetes Mother    Hypertension Brother    Breast cancer Neg Hx     Social History:  reports that she has never smoked. She has never used smokeless tobacco. She reports that she does not drink alcohol and does not use drugs. Allergies:  Allergies  Allergen Reactions   Aspirin  Nausea Only    Patient stated to neuro that it caused an upset stomach   Codeine Other (See Comments)    Unknown    Fentanyl  Other (See Comments)    hypoxia   Oxycodone Hcl Other (See Comments)    Unknown    Penicillins Other (See Comments)    Unknown    Chlorhexidine Gluconate Rash   Medications Prior to Admission  Medication Sig Dispense Refill   acetaminophen  (TYLENOL ) 325 MG tablet Take  1-2 tablets (325-650 mg total) by mouth every 4 (four) hours as needed for mild pain (pain score 1-3).     amLODipine  (NORVASC ) 2.5 MG tablet Take 1 tablet (2.5 mg total) by mouth daily. 30 tablet 0   apixaban  (ELIQUIS ) 5 MG TABS tablet Take 1 tablet (5 mg total) by mouth 2 (two) times daily. 200 tablet 2   aspirin  EC 81 MG tablet Take 1 tablet (81 mg total) by mouth daily. Swallow whole. 30 tablet 0   benazepril  (LOTENSIN ) 20 MG tablet TAKE 1 TABLET BY MOUTH DAILY 100 tablet 2   CALCIUM  PO Take 1 tablet by mouth 2 (two) times daily.     camphor-menthol  (SARNA) lotion Apply topically  as needed for itching. 222 mL 0   celecoxib  (CELEBREX ) 200 MG capsule Take 1 capsule (200 mg total) by mouth daily as needed for moderate pain (pain score 4-6). 30 capsule 0   diclofenac  Sodium (VOLTAREN ) 1 % GEL Apply 2 g topically 4 (four) times daily. To both knees 150 g 0   DULoxetine  (CYMBALTA ) 60 MG capsule Take 60 mg by mouth daily.     ezetimibe  (ZETIA ) 10 MG tablet Take 1 tablet (10 mg total) by mouth daily. 90 tablet 2   famotidine  (PEPCID ) 20 MG tablet Take 1 tablet (20 mg total) by mouth daily. 30 tablet 0   gabapentin  (NEURONTIN ) 100 MG capsule Take 1 capsule (100 mg total) by mouth 3 (three) times daily. (Patient taking differently: Take 300 mg by mouth See admin instructions. 1-2 times daily only) 90 capsule 0   glimepiride  (AMARYL ) 1 MG tablet Take 1 tablet (1 mg total) by mouth daily with breakfast. 90 tablet 4   JARDIANCE  25 MG TABS tablet Take 25 mg by mouth daily.     lidocaine  (LIDODERM ) 5 % Place 2 patches onto the skin daily. Remove & Discard patch within 12 hours or as directed by MD 60 patch 0   methimazole  (TAPAZOLE ) 5 MG tablet TAKE 1 TABLET BY MOUTH DAILY 100 tablet 2   methocarbamol  (ROBAXIN ) 500 MG tablet Take 1 tablet (500 mg total) by mouth every 6 (six) hours as needed for muscle spasms. 30 tablet 0   metoprolol  succinate (TOPROL -XL) 50 MG 24 hr tablet TAKE 1 TABLET BY MOUTH DAILY  WITH OR IMMEDIATELY FOLLOWING A  MEAL 90 tablet 3   Multiple Vitamins-Minerals (MULTIVITAMIN WITH MINERALS) tablet Take 1 tablet by mouth daily.     rosuvastatin  (CRESTOR ) 20 MG tablet Take 1 tablet (20 mg total) by mouth daily after supper. 30 tablet 0   Semaglutide , 2 MG/DOSE, (OZEMPIC , 2 MG/DOSE,) 8 MG/3ML SOPN INJECT SUBCUTANEOUSLY 2 MG EVERY WEEK 9 mL 3   Blood Glucose Monitoring Suppl DEVI 1 each by Does not apply route as directed. Dispense based on patient and insurance preference. Use up to four times daily as directed. (FOR ICD-10 E10.9, E11.9). 1 each 0   ferrous sulfate  325 (65  FE) MG EC tablet Take 1 tablet (325 mg total) by mouth 2 (two) times daily. (Patient not taking: Reported on 03/18/2024) 60 tablet 3   Glucose Blood (BLOOD GLUCOSE TEST STRIPS) STRP 1 each by Does not apply route as directed. Dispense based on patient and insurance preference. Use up to four times daily as directed. (FOR ICD-10 E10.9, E11.9). 100 strip 0   Lancet Device MISC 1 each by Does not apply route as directed. Dispense based on patient and insurance preference. Use up to four times daily as directed. (FOR  ICD-10 E10.9, E11.9). 1 each 0   Lancets MISC 1 each by Does not apply route as directed. Dispense based on patient and insurance preference. Use up to four times daily as directed. (FOR ICD-10 E10.9, E11.9). 100 each 0      Home: Home Living Family/patient expects to be discharged to:: Private residence Living Arrangements: Children Available Help at Discharge: Family, Available 24 hours/day Type of Home: Apartment Home Access: Level entry Home Layout: One level Bathroom Shower/Tub: Engineer, Manufacturing Systems: Standard Bathroom Accessibility: Yes Home Equipment: Grab bars - tub/shower, Cane - single point, Toilet riser Additional Comments: info taken from prior admission  Lives With: Son, Family   Functional History: Prior Function Prior Level of Function : Independent/Modified Independent Mobility Comments: cane PRN ADLs Comments: son assists with ADLs  Functional Status:  Mobility: Bed Mobility Overal bed mobility: Needs Assistance Bed Mobility: Supine to Sit Rolling: Total assist Supine to sit: Min assist, HOB elevated General bed mobility comments: able to move LE's off EOB with MinA to raise trunk Transfers Overall transfer level: Needs assistance Equipment used: Rolling walker (2 wheels) Transfers: Sit to/from Stand, Bed to chair/wheelchair/BSC Sit to Stand: Mod assist, +2 physical assistance, +2 safety/equipment Bed to/from chair/wheelchair/BSC transfer  type:: Step pivot Stand pivot transfers: Max assist, +2 safety/equipment, +2 physical assistance Step pivot transfers: Min assist, +2 safety/equipment General transfer comment: ModAx2 to boost-up with cueing for hand placement. Able to step-pivot with MinA to assist with RW management and +2 for safety Ambulation/Gait Ambulation/Gait assistance: Min assist, +2 safety/equipment Gait Distance (Feet): 6 Feet Assistive device: Rolling walker (2 wheels) Gait Pattern/deviations: Step-through pattern, Decreased stride length, Trunk flexed General Gait Details: slow and slightly unsteady steps with heavy UE reliance. Cues for upright posture as pt tended to lean forwards. Able to step 54ft forwards and backwards with MinA for steadying assist and occasional assist to move RW Gait velocity: decr    ADL: ADL Overall ADL's : Needs assistance/impaired Eating/Feeding: NPO Grooming: Moderate assistance Upper Body Bathing: Moderate assistance Lower Body Bathing: Maximal assistance Upper Body Dressing : Moderate assistance Lower Body Dressing: Maximal assistance Toilet Transfer: Moderate assistance, +2 for physical assistance, +2 for safety/equipment, Maximal assistance, Stand-pivot Toilet Transfer Details (indicate cue type and reason): mod A +2 for rise. initially mod A to progress LLE, but with fatigue, max A for BLE progression to chair Functional mobility during ADLs: Moderate assistance, +2 for safety/equipment, +2 for physical assistance General ADL Comments: patient decliend self care tasks, I already did all that  Cognition: Cognition Overall Cognitive Status: Impaired/Different from baseline Arousal/Alertness: Awake/alert Orientation Level: Oriented X4 Attention: Sustained Sustained Attention: Impaired Sustained Attention Impairment: Verbal basic, Functional basic Memory: Impaired Memory Impairment: Decreased short term memory, Retrieval deficit, Decreased recall of new  information Decreased Short Term Memory: Verbal basic, Functional basic Awareness: Impaired Awareness Impairment: Anticipatory impairment Problem Solving: Impaired Problem Solving Impairment: Verbal basic, Functional basic Behaviors: Impulsive, Restless, Perseveration Safety/Judgment: Impaired Comments: Attempting to remove tubes/TF; has mitts Cognition Arousal: Alert Behavior During Therapy: Flat affect Overall Cognitive Status: Impaired/Different from baseline  Physical Exam: Blood pressure 136/86, pulse 96, temperature 97.7 F (36.5 C), temperature source Oral, resp. rate 17, height 5' 3 (1.6 m), weight 81.3 kg, SpO2 98%. Physical Exam  Results for orders placed or performed during the hospital encounter of 03/18/24 (from the past 48 hours)  Glucose, capillary     Status: Abnormal   Collection Time: 03/27/24 11:26 AM  Result Value Ref Range   Glucose-Capillary  140 (H) 70 - 99 mg/dL    Comment: Glucose reference range applies only to samples taken after fasting for at least 8 hours.  Glucose, capillary     Status: Abnormal   Collection Time: 03/27/24  4:24 PM  Result Value Ref Range   Glucose-Capillary 227 (H) 70 - 99 mg/dL    Comment: Glucose reference range applies only to samples taken after fasting for at least 8 hours.  Glucose, capillary     Status: Abnormal   Collection Time: 03/27/24  9:02 PM  Result Value Ref Range   Glucose-Capillary 143 (H) 70 - 99 mg/dL    Comment: Glucose reference range applies only to samples taken after fasting for at least 8 hours.   Comment 1 Notify RN    Comment 2 Document in Chart   CBC     Status: Abnormal   Collection Time: 03/28/24  3:03 AM  Result Value Ref Range   WBC 6.0 4.0 - 10.5 K/uL   RBC 3.86 (L) 3.87 - 5.11 MIL/uL   Hemoglobin 11.0 (L) 12.0 - 15.0 g/dL   HCT 66.2 (L) 63.9 - 53.9 %   MCV 87.3 80.0 - 100.0 fL   MCH 28.5 26.0 - 34.0 pg   MCHC 32.6 30.0 - 36.0 g/dL   RDW 83.7 (H) 88.4 - 84.4 %   Platelets 260 150 - 400  K/uL   nRBC 0.0 0.0 - 0.2 %    Comment: Performed at [facility], [address] [place], [place] [address]  Glucose, capillary     Status: Abnormal   Collection Time: 03/28/24  6:18 AM  Result Value Ref Range   Glucose-Capillary 152 (H) 70 - 99 mg/dL    Comment: Glucose reference range applies only to samples taken after fasting for at least 8 hours.  Glucose, capillary     Status: Abnormal   Collection Time: 03/28/24 11:17 AM  Result Value Ref Range   Glucose-Capillary 219 (H) 70 - 99 mg/dL    Comment: Glucose reference range applies only to samples taken after fasting for at least 8 hours.  Glucose, capillary     Status: Abnormal   Collection Time: 03/28/24  9:14 PM  Result Value Ref Range   Glucose-Capillary 66 (L) 70 - 99 mg/dL    Comment: Glucose reference range applies only to samples taken after fasting for at least 8 hours.   Comment 1 Notify RN   Glucose, capillary     Status: None   Collection Time: 03/28/24  9:46 PM  Result Value Ref Range   Glucose-Capillary 98 70 - 99 mg/dL    Comment: Glucose reference range applies only to samples taken after fasting for at least 8 hours.  Glucose, capillary     Status: Abnormal   Collection Time: 03/29/24  6:18 AM  Result Value Ref Range   Glucose-Capillary 130 (H) 70 - 99 mg/dL    Comment: Glucose reference range applies only to samples taken after fasting for at least 8 hours.   Comment 1 Notify RN    No results found.    Blood pressure 136/86, pulse 96, temperature 97.7 F (36.5 C), temperature source Oral, resp. rate 17, height 5' 3 (1.6 m), weight 81.3 kg, SpO2 98%.  Medical Problem List and Plan: 1. Functional deficits secondary to ***  -patient may *** shower  -ELOS/Goals: *** 2.  Antithrombotics: -DVT/anticoagulation:  Pharmaceutical: Eliquis   -antiplatelet therapy: ASA  3. Pain Management: tylenol  prn.  4. Mood/Behavior/Sleep: LCSW  to follow for evaluation and support.   -antipsychotic agents:  N/A  --melatonin prn for insomnia 5. Neuropsych/cognition: This patient is not  capable of making decisions on her own behalf. 6. Skin/Wound Care: Routine pressure relief measures 7. Fluids/Electrolytes/Nutrition: Check CMET in am  --on supplements due to low protein stores 8. T2DM: Monitor BS ac/hs. Insulin  glargine decreased to 6 units on 11/22 --SSI for elevated BS. BS were likely elevated due to boost between meals-->change to ensure Max.  --titrate insulin  as needed.   9. 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 10. Acute on chronic renal failure: Encourage fluid intake.  --Recheck in am as BUN/K trending up 11. Hyponatremia: Resolving 140 @ admission to drop to 128-->134 --Recheck in am 12. Afib/HTN: Monitor BP TID. Continue Metoprolol  bid      ***  Sharlet GORMAN Schmitz, PA-C 03/29/2024

## 2024-03-29 NOTE — Progress Notes (Signed)
 Patient was almost done earing her dinner and calling out for help.  RN went in the room,  patientt c/o severe pain and cramping in her Right hand,  the nest min,  she stop talking and responding.  Raopid response called and Dr.  Letha Cheadle notified.  Vital sign unremarkable,  Temp 98.9,  BP 120/65,  pulse 83,  resp 16/min. O2 sat 93% on RA.  CBG 279, patient came around after around 5 miins, Patient states that there is someone in her room and she want that person to be out of her room.  Per Dr.  Dawson Tan after assessed patient,  patient is delirius due to hospital stay and is okay to transfer Rehab.

## 2024-03-29 NOTE — H&P (Signed)
 Physical Medicine and Rehabilitation Admission H&P        Chief Complaint  Patient presents with   Functional deficits due to stroke and PE   Weakness and left inattention      HPI: Megan Fischer.Megan Fischer is a 73 year old female with history of HTN, T2DM, A fib, giant cell tumor of left tibia, multinodular goiter, CKD 4, recent R-PCA stroke w/CIR who was admitted on 03/18/24 with 3 day history of headache, restlessness and confusion. CT/A/P ordered due to intractable N/V in ED and showed suspicion of pyelonephritis and noted to have elevated lactate 3.9 and euglycemic DKA.   Thyroid  function studies showed t TH ,0.1 with free T4 1.96 and question of thyrotoxicosis and methimazole  added.  CTA A/P negative for ischemia or other acute findings. CT head negative for acute changes. She was treated with DKA protocol. She developed LE edema and rise in BNP. Limited 2 D echo done revealing normal ef with severely elevated PAH.  CTA chest and MRI brain recommended by cardiology due to ongoing issue with confusion and lethargy.SABRA   She was found to have submassive PE  with right heart strain and new scattered LUL  pulmonary nodules  of indeterminate nature. MRI brain revealed new acute R-MCA infarct with extensive subacute R-PCA infarct and diminished flow R-ICA c/w known occlusion. Neurology also consulted and recommended ASA with holding of of heparin  in 1-2 days due to evidence of petechial hemorrhage at site of new stroke. Question stroke from paradoxical embolus from PE.  MRI tib/fib ordered which was negative for recurrent tumor with question of pes anserine bursitis. BLE dopplers negative for DVT. Pan CT negative for mets. LT-EEG negative for seizures.   GOC discussed with family who confirmed DNR and treating treatable pending outcomes.  Mentation has improved and she has been advanced to regular. Heparin  added per stroke protocol and eliquis  resumed. Mentation improving but she continues to have left inattention,  cognitive deficits, motor planning deficits and weakness. She requires min to mod assist with mobility and ADLs. CIR recommended due to functional decline.        ROS  limited by cognition        Past Medical History:  Diagnosis Date   Atrial fibrillation (HCC)     Chronic disease anemia 03/29/2011   Diabetes mellitus     Dyslipidemia     Hypertension     Obesities, morbid (HCC)     Osteoarthritis                 Past Surgical History:  Procedure Laterality Date   CHOLECYSTECTOMY       REPLACEMENT TOTAL KNEE BILATERAL       SHOULDER SURGERY        RIGHT SHOULDER   TUBAL LIGATION                   Family History  Problem Relation Age of Onset   Heart attack Mother     Stroke Mother     Diabetes Mother     Hypertension Brother     Breast cancer Neg Hx            Social History:  reports that she has never smoked. She has never used smokeless tobacco. She reports that she does not drink alcohol and does not use drugs. Allergies:  Allergies       Allergies  Allergen Reactions   Aspirin  Nausea Only      Patient stated to  neuro that it caused an upset stomach   Codeine Other (See Comments)      Unknown    Fentanyl  Other (See Comments)      hypoxia   Oxycodone Hcl Other (See Comments)      Unknown    Penicillins Other (See Comments)      Unknown    Chlorhexidine Gluconate Rash            Medications Prior to Admission  Medication Sig Dispense Refill   acetaminophen  (TYLENOL ) 325 MG tablet Take 1-2 tablets (325-650 mg total) by mouth every 4 (four) hours as needed for mild pain (pain score 1-3).       amLODipine  (NORVASC ) 2.5 MG tablet Take 1 tablet (2.5 mg total) by mouth daily. 30 tablet 0   apixaban  (ELIQUIS ) 5 MG TABS tablet Take 1 tablet (5 mg total) by mouth 2 (two) times daily. 200 tablet 2   aspirin  EC 81 MG tablet Take 1 tablet (81 mg total) by mouth daily. Swallow whole. 30 tablet 0   benazepril  (LOTENSIN ) 20 MG tablet TAKE 1 TABLET BY MOUTH  DAILY 100 tablet 2   CALCIUM  PO Take 1 tablet by mouth 2 (two) times daily.       camphor-menthol  (SARNA) lotion Apply topically as needed for itching. 222 mL 0   celecoxib  (CELEBREX ) 200 MG capsule Take 1 capsule (200 mg total) by mouth daily as needed for moderate pain (pain score 4-6). 30 capsule 0   diclofenac  Sodium (VOLTAREN ) 1 % GEL Apply 2 g topically 4 (four) times daily. To both knees 150 g 0   DULoxetine  (CYMBALTA ) 60 MG capsule Take 60 mg by mouth daily.       ezetimibe  (ZETIA ) 10 MG tablet Take 1 tablet (10 mg total) by mouth daily. 90 tablet 2   famotidine  (PEPCID ) 20 MG tablet Take 1 tablet (20 mg total) by mouth daily. 30 tablet 0   gabapentin  (NEURONTIN ) 100 MG capsule Take 1 capsule (100 mg total) by mouth 3 (three) times daily. (Patient taking differently: Take 300 mg by mouth See admin instructions. 1-2 times daily only) 90 capsule 0   glimepiride  (AMARYL ) 1 MG tablet Take 1 tablet (1 mg total) by mouth daily with breakfast. 90 tablet 4   JARDIANCE  25 MG TABS tablet Take 25 mg by mouth daily.       lidocaine  (LIDODERM ) 5 % Place 2 patches onto the skin daily. Remove & Discard patch within 12 hours or as directed by MD 60 patch 0   methimazole  (TAPAZOLE ) 5 MG tablet TAKE 1 TABLET BY MOUTH DAILY 100 tablet 2   methocarbamol  (ROBAXIN ) 500 MG tablet Take 1 tablet (500 mg total) by mouth every 6 (six) hours as needed for muscle spasms. 30 tablet 0   metoprolol  succinate (TOPROL -XL) 50 MG 24 hr tablet TAKE 1 TABLET BY MOUTH DAILY  WITH OR IMMEDIATELY FOLLOWING A  MEAL 90 tablet 3   Multiple Vitamins-Minerals (MULTIVITAMIN WITH MINERALS) tablet Take 1 tablet by mouth daily.       rosuvastatin  (CRESTOR ) 20 MG tablet Take 1 tablet (20 mg total) by mouth daily after supper. 30 tablet 0   Semaglutide , 2 MG/DOSE, (OZEMPIC , 2 MG/DOSE,) 8 MG/3ML SOPN INJECT SUBCUTANEOUSLY 2 MG EVERY WEEK 9 mL 3   Blood Glucose Monitoring Suppl DEVI 1 each by Does not apply route as directed. Dispense based on  patient and insurance preference. Use up to four times daily as directed. (FOR ICD-10 E10.9, E11.9). 1 each  0   ferrous sulfate  325 (65 FE) MG EC tablet Take 1 tablet (325 mg total) by mouth 2 (two) times daily. (Patient not taking: Reported on 03/18/2024) 60 tablet 3   Glucose Blood (BLOOD GLUCOSE TEST STRIPS) STRP 1 each by Does not apply route as directed. Dispense based on patient and insurance preference. Use up to four times daily as directed. (FOR ICD-10 E10.9, E11.9). 100 strip 0   Lancet Device MISC 1 each by Does not apply route as directed. Dispense based on patient and insurance preference. Use up to four times daily as directed. (FOR ICD-10 E10.9, E11.9). 1 each 0   Lancets MISC 1 each by Does not apply route as directed. Dispense based on patient and insurance preference. Use up to four times daily as directed. (FOR ICD-10 E10.9, E11.9). 100 each 0              Home: Home Living Family/patient expects to be discharged to:: Private residence Living Arrangements: Children Available Help at Discharge: Family, Available 24 hours/day Type of Home: Apartment Home Access: Level entry Home Layout: One level Bathroom Shower/Tub: Engineer, Manufacturing Systems: Standard Bathroom Accessibility: Yes Home Equipment: Grab bars - tub/shower, Cane - single point, Toilet riser Additional Comments: info taken from prior admission  Lives With: Son, Family   Functional History: Prior Function Prior Level of Function : Independent/Modified Independent Mobility Comments: cane PRN ADLs Comments: son assists with ADLs   Functional Status:  Mobility: Bed Mobility Overal bed mobility: Needs Assistance Bed Mobility: Supine to Sit Rolling: Total assist Supine to sit: Min assist, HOB elevated General bed mobility comments: able to move LE's off EOB with MinA to raise trunk Transfers Overall transfer level: Needs assistance Equipment used: Rolling walker (2 wheels) Transfers: Sit to/from  Stand, Bed to chair/wheelchair/BSC Sit to Stand: Mod assist, +2 physical assistance, +2 safety/equipment Bed to/from chair/wheelchair/BSC transfer type:: Step pivot Stand pivot transfers: Max assist, +2 safety/equipment, +2 physical assistance Step pivot transfers: Min assist, +2 safety/equipment General transfer comment: ModAx2 to boost-up with cueing for hand placement. Able to step-pivot with MinA to assist with RW management and +2 for safety Ambulation/Gait Ambulation/Gait assistance: Min assist, +2 safety/equipment Gait Distance (Feet): 6 Feet Assistive device: Rolling walker (2 wheels) Gait Pattern/deviations: Step-through pattern, Decreased stride length, Trunk flexed General Gait Details: slow and slightly unsteady steps with heavy UE reliance. Cues for upright posture as pt tended to lean forwards. Able to step 64ft forwards and backwards with MinA for steadying assist and occasional assist to move RW Gait velocity: decr   ADL: ADL Overall ADL's : Needs assistance/impaired Eating/Feeding: NPO Grooming: Moderate assistance Upper Body Bathing: Moderate assistance Lower Body Bathing: Maximal assistance Upper Body Dressing : Moderate assistance Lower Body Dressing: Maximal assistance Toilet Transfer: Moderate assistance, +2 for physical assistance, +2 for safety/equipment, Maximal assistance, Stand-pivot Toilet Transfer Details (indicate cue type and reason): mod A +2 for rise. initially mod A to progress LLE, but with fatigue, max A for BLE progression to chair Functional mobility during ADLs: Moderate assistance, +2 for safety/equipment, +2 for physical assistance General ADL Comments: patient decliend self care tasks, I already did all that   Cognition: Cognition Overall Cognitive Status: Impaired/Different from baseline Arousal/Alertness: Awake/alert Orientation Level: Oriented X4 Attention: Sustained Sustained Attention: Impaired Sustained Attention Impairment: Verbal  basic, Functional basic Memory: Impaired Memory Impairment: Decreased short term memory, Retrieval deficit, Decreased recall of new information Decreased Short Term Memory: Verbal basic, Functional basic Awareness: Impaired Awareness Impairment: Anticipatory  impairment Problem Solving: Impaired Problem Solving Impairment: Verbal basic, Functional basic Behaviors: Impulsive, Restless, Perseveration Safety/Judgment: Impaired Comments: Attempting to remove tubes/TF; has mitts Cognition Arousal: Alert Behavior During Therapy: Flat affect Overall Cognitive Status: Impaired/Different from baseline   Physical Exam: Blood pressure 136/86, pulse 96, temperature 97.7 F (36.5 C), temperature source Oral, resp. rate 17, height 5' 3 (1.6 m), weight 81.3 kg, SpO2 98%. Physical Exam   General: No acute distress Mood and affect are appropriate Heart: Irregularly irregular no rubs murmurs or extra sounds Lungs: Clear to auscultation, breathing unlabored, no rales or wheezes Abdomen: Positive bowel sounds, soft nontender to palpation, nondistended Extremities: No clubbing, cyanosis, or edema Skin: No evidence of breakdown, no evidence of rash Neurologic:Oriented to person and place not time , Severe left neglect and left field cut Cranial nerves II through XII intact, motor strength is 5/5 in RIght deltoid, bicep, tricep, grip, hip flexor, knee extensors, ankle dorsiflexor and plantar flexor LUE 3-/5 proximal to distal , LLE 4-/5 Proximal , 3- at ankle  Sensory exam reduced LUE  Cerebellar limited on left due to weakness Musculoskeletal: Full range of motion in all 4 extremities. No joint swelling  Lab Results Last 48 Hours        Results for orders placed or performed during the hospital encounter of 03/18/24 (from the past 48 hours)  Glucose, capillary     Status: Abnormal    Collection Time: 03/27/24 11:26 AM  Result Value Ref Range    Glucose-Capillary 140 (H) 70 - 99 mg/dL      Comment:  Glucose reference range applies only to samples taken after fasting for at least 8 hours.  Glucose, capillary     Status: Abnormal    Collection Time: 03/27/24  4:24 PM  Result Value Ref Range    Glucose-Capillary 227 (H) 70 - 99 mg/dL      Comment: Glucose reference range applies only to samples taken after fasting for at least 8 hours.  Glucose, capillary     Status: Abnormal    Collection Time: 03/27/24  9:02 PM  Result Value Ref Range    Glucose-Capillary 143 (H) 70 - 99 mg/dL      Comment: Glucose reference range applies only to samples taken after fasting for at least 8 hours.    Comment 1 Notify RN      Comment 2 Document in Chart    CBC     Status: Abnormal    Collection Time: 03/28/24  3:03 AM  Result Value Ref Range    WBC 6.0 4.0 - 10.5 K/uL    RBC 3.86 (L) 3.87 - 5.11 MIL/uL    Hemoglobin 11.0 (L) 12.0 - 15.0 g/dL    HCT 66.2 (L) 63.9 - 46.0 %    MCV 87.3 80.0 - 100.0 fL    MCH 28.5 26.0 - 34.0 pg    MCHC 32.6 30.0 - 36.0 g/dL    RDW 83.7 (H) 88.4 - 15.5 %    Platelets 260 150 - 400 K/uL    nRBC 0.0 0.0 - 0.2 %      Comment: Performed at [facility], [address] [place], [place] [address]  Glucose, capillary     Status: Abnormal    Collection Time: 03/28/24  6:18 AM  Result Value Ref Range    Glucose-Capillary 152 (H) 70 - 99 mg/dL      Comment: Glucose reference range applies only to samples taken after fasting for at least  8 hours.  Glucose, capillary     Status: Abnormal    Collection Time: 03/28/24 11:17 AM  Result Value Ref Range    Glucose-Capillary 219 (H) 70 - 99 mg/dL      Comment: Glucose reference range applies only to samples taken after fasting for at least 8 hours.  Glucose, capillary     Status: Abnormal    Collection Time: 03/28/24  9:14 PM  Result Value Ref Range    Glucose-Capillary 66 (L) 70 - 99 mg/dL      Comment: Glucose reference range applies only to samples taken after fasting for at least 8 hours.    Comment 1 Notify RN     Glucose, capillary     Status: None    Collection Time: 03/28/24  9:46 PM  Result Value Ref Range    Glucose-Capillary 98 70 - 99 mg/dL      Comment: Glucose reference range applies only to samples taken after fasting for at least 8 hours.  Glucose, capillary     Status: Abnormal    Collection Time: 03/29/24  6:18 AM  Result Value Ref Range    Glucose-Capillary 130 (H) 70 - 99 mg/dL      Comment: Glucose reference range applies only to samples taken after fasting for at least 8 hours.    Comment 1 Notify RN        Imaging Results (Last 48 hours)  No results found.         Blood pressure 136/86, pulse 96, temperature 97.7 F (36.5 C), temperature source Oral, resp. rate 17, height 5' 3 (1.6 m), weight 81.3 kg, SpO2 98%.   Medical Problem List and Plan: 1. Functional deficits secondary to Right MCA infarct             -patient may  shower             -ELOS/Goals: 12-16d 2.  Antithrombotics: -DVT/anticoagulation:  Pharmaceutical: Eliquis              -antiplatelet therapy: ASA  3. Pain Management: tylenol  prn.  4. Mood/Behavior/Sleep: LCSW to follow for evaluation and support.              -antipsychotic agents: N/A             --melatonin prn for insomnia 5. Neuropsych/cognition: This patient is not  capable of making decisions on her own behalf. 6. Skin/Wound Care: Routine pressure relief measures 7. Fluids/Electrolytes/Nutrition: Check CMET in am             --on supplements due to low protein stores 8. T2DM: Monitor BS ac/hs. Insulin  glargine decreased to 6 units on 11/22 --SSI for elevated BS. BS were likely elevated due to boost between meals-->change to ensure Max.             --titrate insulin  as needed.   9. 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 10. Acute on chronic renal failure: Encourage fluid intake.  --Recheck in am as BUN/K trending up 11. Hyponatremia: Resolving 140 @ admission to drop to 128-->134 --Recheck in am 12. Afib/HTN: Monitor BP TID.  Continue Metoprolol  bid   13.  Sundowning vs visuoperceptual deficits due to stroke     Sharlet GORMAN Schmitz, PA-C 03/29/2024 I have personally performed a face to face diagnostic evaluation of this patient.  Additionally, I have reviewed and concur with the physician assistant's documentation above. Prentice CHARLENA Compton M.D. [facility] Fellow Am Acad of Phys Med and Rehab Diplomate  Am Board of Electrodiagnostic Med Fellow Am Board of Interventional Pain

## 2024-03-29 NOTE — Progress Notes (Signed)
 Rehab called to hold the transfer of patinet due to bed issue.

## 2024-03-29 NOTE — Progress Notes (Signed)
 Inpatient Rehabilitation Admission Medication Review by a Pharmacist  A complete drug regimen review was completed for this patient to identify any potential clinically significant medication issues.  High Risk Drug Classes Is patient taking? Indication by Medication  Antipsychotic Yes, as an intravenous medication Compazine  - N/V Seroquel  - delirium   Anticoagulant Yes Apixaban  - PE  Antibiotic No   Opioid No   Antiplatelet Yes Aspirin  - antiplatelet  Hypoglycemics/insulin  Yes Insulin  aspart, glargine - hyperglycemia  Vasoactive Medication Yes Metoprolol  - HTN, Afib  Chemotherapy No   Other Yes Atorvastatin , Ezetimibe  - cholesterol Methimazole  - hyperthyroid Ensure Max - supplement fleet enema , bisacodyl  , senna , and - constipation Maalox- indigestion Famotidine - reflux  Diphenhydramine - itching  Acetaminophen - pain  Robitussin- cough   melatonin and -insomnia Cepacol - sore throat     Type of Medication Issue Identified Description of Issue Recommendation(s)  Drug Interaction(s) (clinically significant)     Duplicate Therapy     Allergy     No Medication Administration End Date     Incorrect Dose     Additional Drug Therapy Needed     Significant med changes from prior encounter (inform family/care partners about these prior to discharge). Glimepiride  and Jardiance  stopped    Amlodipine , benazepril , duloxetine , gabapentin  not continued Communicate relevant medication changes to patient/family members at discharge from CIR.   Restart or discontinue PTA meds not resumed in CIR at discharge if clinically indicated.   Other       Clinically significant medication issues were identified that warrant physician communication and completion of prescribed/recommended actions by midnight of the next day:  No  Name of provider notified for urgent issues identified:   Provider Method of Notification:     Pharmacist comments:   Time spent performing this drug  regimen review (minutes):  20    Rocky Slade, PharmD, BCPS 03/29/2024 8:35 PM

## 2024-03-29 NOTE — Plan of Care (Signed)
  Problem: Health Behavior/Discharge Planning: Goal: Ability to manage health-related needs will improve Outcome: Progressing   Problem: Clinical Measurements: Goal: Ability to maintain clinical measurements within normal limits will improve Outcome: Progressing Goal: Will remain free from infection Outcome: Progressing Goal: Diagnostic test results will improve Outcome: Progressing Goal: Respiratory complications will improve Outcome: Progressing Goal: Cardiovascular complication will be avoided Outcome: Progressing   Problem: Elimination: Goal: Will not experience complications related to bowel motility Outcome: Progressing Goal: Will not experience complications related to urinary retention Outcome: Progressing   Problem: Pain Managment: Goal: General experience of comfort will improve and/or be controlled Outcome: Progressing   Problem: Safety: Goal: Ability to remain free from injury will improve Outcome: Progressing   Problem: Skin Integrity: Goal: Risk for impaired skin integrity will decrease Outcome: Progressing

## 2024-03-29 NOTE — Progress Notes (Signed)
 Patient discharged to [facility] room 4W17 today.  Report given to  RN at 4W.  Telemetry discontinued.  Patient ia comfortable,  vital signs unremarkable.

## 2024-03-30 DIAGNOSIS — D649 Anemia, unspecified: Secondary | ICD-10-CM

## 2024-03-30 DIAGNOSIS — I10 Essential (primary) hypertension: Secondary | ICD-10-CM

## 2024-03-30 DIAGNOSIS — K5901 Slow transit constipation: Secondary | ICD-10-CM | POA: Diagnosis not present

## 2024-03-30 DIAGNOSIS — N179 Acute kidney failure, unspecified: Secondary | ICD-10-CM

## 2024-03-30 DIAGNOSIS — I63511 Cerebral infarction due to unspecified occlusion or stenosis of right middle cerebral artery: Secondary | ICD-10-CM | POA: Diagnosis not present

## 2024-03-30 LAB — COMPREHENSIVE METABOLIC PANEL WITH GFR
ALT: 40 U/L (ref 0–44)
AST: 52 U/L — ABNORMAL HIGH (ref 15–41)
Albumin: 2.5 g/dL — ABNORMAL LOW (ref 3.5–5.0)
Alkaline Phosphatase: 79 U/L (ref 38–126)
Anion gap: 7 (ref 5–15)
BUN: 46 mg/dL — ABNORMAL HIGH (ref 8–23)
CO2: 24 mmol/L (ref 22–32)
Calcium: 8.6 mg/dL — ABNORMAL LOW (ref 8.9–10.3)
Chloride: 107 mmol/L (ref 98–111)
Creatinine, Ser: 1.47 mg/dL — ABNORMAL HIGH (ref 0.44–1.00)
GFR, Estimated: 37 mL/min — ABNORMAL LOW (ref >60)
Glucose, Bld: 92 mg/dL (ref 70–99)
Potassium: 4.5 mmol/L (ref 3.5–5.1)
Sodium: 138 mmol/L (ref 135–145)
Total Bilirubin: 0.6 mg/dL (ref 0.0–1.2)
Total Protein: 5.8 g/dL — ABNORMAL LOW (ref 6.5–8.1)

## 2024-03-30 LAB — GLUCOSE, CAPILLARY
Glucose-Capillary: 99 mg/dL (ref 70–99)
Glucose-Capillary: 220 mg/dL — ABNORMAL HIGH (ref 70–99)
Glucose-Capillary: 240 mg/dL — ABNORMAL HIGH (ref 70–99)
Glucose-Capillary: 169 mg/dL — ABNORMAL HIGH (ref 70–99)

## 2024-03-30 LAB — CBC WITH DIFFERENTIAL/PLATELET
Abs Immature Granulocytes: 0.05 K/uL (ref 0.00–0.07)
Basophils Absolute: 0 K/uL (ref 0.0–0.1)
Basophils Relative: 1 %
Eosinophils Absolute: 0 K/uL (ref 0.0–0.5)
Eosinophils Relative: 0 %
HCT: 31.7 % — ABNORMAL LOW (ref 36.0–46.0)
Hemoglobin: 10.3 g/dL — ABNORMAL LOW (ref 12.0–15.0)
Immature Granulocytes: 1 %
Lymphocytes Relative: 36 %
Lymphs Abs: 1.9 K/uL (ref 0.7–4.0)
MCH: 28.5 pg (ref 26.0–34.0)
MCHC: 32.5 g/dL (ref 30.0–36.0)
MCV: 87.8 fL (ref 80.0–100.0)
Monocytes Absolute: 0.7 K/uL (ref 0.1–1.0)
Monocytes Relative: 13 %
Neutro Abs: 2.6 K/uL (ref 1.7–7.7)
Neutrophils Relative %: 49 %
Platelets: 289 K/uL (ref 150–400)
RBC: 3.61 MIL/uL — ABNORMAL LOW (ref 3.87–5.11)
RDW: 16.3 % — ABNORMAL HIGH (ref 11.5–15.5)
WBC: 5.3 K/uL (ref 4.0–10.5)
nRBC: 0 % (ref 0.0–0.2)

## 2024-03-30 NOTE — Progress Notes (Signed)
 PROGRESS NOTE   Subjective/Complaints:  Pt doing ok, sleepy but arouseable. Says she usually isn't awake until mid-morning at home. Slept well though. Denies pain. LBM yesterday. Urinating fine per pt, incontinent per documentation. No other complaints or concerns.   ROS: as per HPI. Denies CP, SOB, abd pain, N/V/D/C, or any other complaints at this time.     Objective:   No results found. Recent Labs    03/28/24 0303 03/30/24 0413  WBC 6.0 5.3  HGB 11.0* 10.3*  HCT 33.7* 31.7*  PLT 260 289   Recent Labs    03/30/24 0413  NA 138  K 4.5  CL 107  CO2 24  GLUCOSE 92  BUN 46*  CREATININE 1.47*  CALCIUM  8.6*         Intake/Output Summary (Last 24 hours) at 03/30/2024 1052 Last data filed at 03/30/2024 0750 Gross per 24 hour  Intake 120 ml  Output --  Net 120 ml        Physical Exam: Vital Signs Blood pressure 135/61, pulse 81, temperature 97.7 F (36.5 C), temperature source Oral, resp. rate 18, height 5' 3 (1.6 m), weight 79.3 kg, SpO2 98%.  General: No acute distress, sleepy but arouseable Mood and affect are appropriate Heart: Irregularly irregular no rubs murmurs or extra sounds Lungs: Clear to auscultation, breathing unlabored, no rales or wheezes Abdomen: Positive bowel sounds, soft nontender to palpation, nondistended Extremities: No clubbing, cyanosis, or edema Skin: No evidence of breakdown, no evidence of rash Neuro: sleepy but arouseable, oriented to person/place/time  PRIOR EXAMS: Neurologic:Oriented to person and place not time , Severe left neglect and left field cut Cranial nerves II through XII intact, motor strength is 5/5 in RIght deltoid, bicep, tricep, grip, hip flexor, knee extensors, ankle dorsiflexor and plantar flexor LUE 3-/5 proximal to distal , LLE 4-/5 Proximal , 3- at ankle  Sensory exam reduced LUE  Cerebellar limited on left due to weakness Musculoskeletal: Full  range of motion in all 4 extremities. No joint swelling  Assessment/Plan: 1. Functional deficits which require 3+ hours per day of interdisciplinary therapy in a comprehensive inpatient rehab setting. Physiatrist is providing close team supervision and 24 hour management of active medical problems listed below. Physiatrist and rehab team continue to assess barriers to discharge/monitor patient progress toward functional and medical goals  Care Tool:  Bathing              Bathing assist       Upper Body Dressing/Undressing Upper body dressing        Upper body assist      Lower Body Dressing/Undressing Lower body dressing            Lower body assist       Toileting Toileting    Toileting assist       Transfers Chair/bed transfer  Transfers assist           Locomotion Ambulation   Ambulation assist              Walk 10 feet activity   Assist           Walk 50 feet activity   Assist  Walk 150 feet activity   Assist           Walk 10 feet on uneven surface  activity   Assist           Wheelchair     Assist               Wheelchair 50 feet with 2 turns activity    Assist            Wheelchair 150 feet activity     Assist          Blood pressure 135/61, pulse 81, temperature 97.7 F (36.5 C), temperature source Oral, resp. rate 18, height 5' 3 (1.6 m), weight 79.3 kg, SpO2 98%.  Medical Problem List and Plan: 1. Functional deficits secondary to Right MCA infarct             -patient may  shower             -ELOS/Goals: 12-16d  -Continue CIR  2.  Antithrombotics: -DVT/anticoagulation:  Pharmaceutical: Eliquis  5mg  BID             -antiplatelet therapy: ASA 81mg  daily  3. Pain Management: tylenol  prn.   4. Mood/Behavior/Sleep: LCSW to follow for evaluation and support.              -antipsychotic agents: N/A             -melatonin prn for insomnia  -Seroquel  12.5mg   nightly  5. Neuropsych/cognition: This patient is not capable of making decisions on her own behalf.  6. Skin/Wound Care: Routine pressure relief measures  7. Fluids/Electrolytes/Nutrition: routine I&O and labs             --on supplements due to low protein stores  -GERD: pepcid  20mg  daily   8. T2DM: Monitor BS ac/hs. Insulin  glargine decreased to 6 units daily on 11/22 --SSI for elevated BS. BS were likely elevated due to boost between meals-->change to ensure Max.             --titrate insulin  as needed.   -03/30/24 CBGs variable but perhaps improving, monitor with new dose of Lantus .   CBG (last 3)  Recent Labs    03/29/24 1753 03/29/24 2105 03/30/24 0554  GLUCAP 279* 122* 99    9. Thyrotoxicosis: Was started on methimazole  10mg  daily  10. Acute on chronic renal failure: Encourage fluid intake.  --Recheck in am as BUN/K trending up -03/30/24 Cr 1.47, slightly higher than 11/19-20 but generally similar to prior, seems this is likely close to her baseline (1.2-1.6); K+ 4.5, improving. Monitor routinely.   11. Hyponatremia: Resolving 140 @ admission to drop to 128-->134 -03/30/24 Na 138, improved  12. Afib/HTN: Monitor BP TID. Continue Metoprolol  25mg  bid   -BP stable  Vitals:   03/29/24 2030 03/30/24 0450 03/30/24 1006  BP: 124/62 120/70 135/61   13.  Sundowning vs visuoperceptual deficits due to stroke   14. Anemia: Hgb 11.0>>10.3, monitor for bleeding and check routine labs  15. HLD: continue lipitor 40mg  daily, zetia  10mg  daily  16. Constipation: continue senna 17.2mg  nightly  -03/30/24 LBM yesterday, monitor    LOS: 1 days A FACE TO FACE EVALUATION WAS PERFORMED  [address] 03/30/2024, 10:52 AM

## 2024-03-30 NOTE — Evaluation (Signed)
 Physical Therapy Assessment and Plan  Patient Details  Name: Megan Fischer MRN: [id number] Date of Birth: 1950/09/26  PT Diagnosis: Difficulty walking, Hemiplegia non-dominant, Impaired cognition, Impaired sensation, Muscle weakness, and Pain in joint Rehab Potential: Fair ELOS: 12-14 days   Today's Date: 03/30/2024 PT Individual Time: 1105-1200 PT Individual Time Calculation (min): 55 min    Hospital Problem: Principal Problem:   Acute ischemic right MCA stroke [facility])   Past Medical History:  Past Medical History:  Diagnosis Date   Atrial fibrillation (HCC)    Chronic disease anemia 03/29/2011   Diabetes mellitus    Dyslipidemia    Hypertension    Obesities, morbid (HCC)    Osteoarthritis    Past Surgical History:  Past Surgical History:  Procedure Laterality Date   CHOLECYSTECTOMY     REPLACEMENT TOTAL KNEE BILATERAL     SHOULDER SURGERY     RIGHT SHOULDER   TUBAL LIGATION      Assessment & Plan Clinical Impression: Megan Fischer is a 73 year old female with history of HTN, T2DM, A fib, giant cell tumor of left tibia, multinodular goiter, CKD 4, recent R-PCA stroke w/CIR who was admitted on 03/18/24 with 3 day history of headache, restlessness and confusion. CT/A/P ordered due to intractable N/V in ED and showed suspicion of pyelonephritis and noted to have elevated lactate 3.9 and euglycemic DKA.   Thyroid  function studies showed t TH ,0.1 with free T4 1.96 and question of thyrotoxicosis and methimazole  added.  CTA A/P negative for ischemia or other acute findings. CT head negative for acute changes. She was treated with DKA protocol. She developed LE edema and rise in BNP. Limited 2 D echo done revealing normal ef with severely elevated PAH.  CTA chest and MRI brain recommended by cardiology due to ongoing issue with confusion and lethargy.SABRA   She was found to have submassive PE  with right heart strain and new scattered LUL  pulmonary nodules  of indeterminate nature.  MRI brain revealed new acute R-MCA infarct with extensive subacute R-PCA infarct and diminished flow R-ICA c/w known occlusion. Neurology also consulted and recommended ASA with holding of of heparin  in 1-2 days due to evidence of petechial hemorrhage at site of new stroke. Question stroke from paradoxical embolus from PE.  MRI tib/fib ordered which was negative for recurrent tumor with question of pes anserine bursitis. BLE dopplers negative for DVT. Pan CT negative for mets. LT-EEG negative for seizures.   GOC discussed with family who confirmed DNR and treating treatable pending outcomes.  Mentation has improved and she has been advanced to regular. Heparin  added per stroke protocol and eliquis  resumed. Mentation improving but she continues to have left inattention, cognitive deficits, motor planning deficits and weakness. She requires min to mod assist with mobility and ADLs. CIR recommended due to functional decline.    Patient transferred to CIR on 03/29/2024 .   Patient currently requires mod with mobility secondary to muscle weakness, abnormal tone, motor apraxia, and decreased motor planning, and decreased standing balance, decreased postural control, decreased balance strategies, and difficulty maintaining precautions.  Prior to hospitalization, patient was min with mobility and lived with Son, Other (Comment) (son and DIL, they work opposing shifts so someone is home but sleeping) in a Apartment home.  Home access is  Level entry.  Patient will benefit from skilled PT intervention to maximize safe functional mobility, minimize fall risk, and decrease caregiver burden for planned discharge home with 24 hour assist.  Anticipate  patient will benefit from follow up HH at discharge.  PT - End of Session Activity Tolerance: Tolerates 30+ min activity with multiple rests Endurance Deficit: Yes PT Assessment Rehab Potential (ACUTE/IP ONLY): Fair PT Barriers to Discharge: [facility] environment  access/layout;Pending surgery PT Patient demonstrates impairments in the following area(s): Balance;Skin Integrity;Pain;Edema;Behavior;Safety;Endurance;Motor;Sensory PT Transfers Functional Problem(s): Bed Mobility;Bed to Chair;Car PT Locomotion Functional Problem(s): Ambulation;Wheelchair Mobility PT Plan PT Intensity: Minimum of 1-2 x/day ,45 to 90 minutes PT Frequency: 5 out of 7 days PT Duration Estimated Length of Stay: 12-14 days PT Treatment/Interventions: Ambulation/gait training;Cognitive remediation/compensation;Discharge planning;DME/adaptive equipment instruction;Functional mobility training;Pain management;Psychosocial support;Therapeutic Activities;Splinting/orthotics;UE/LE Strength taining/ROM;Visual/perceptual remediation/compensation;Wheelchair propulsion/positioning;UE/LE Coordination activities;Therapeutic Exercise;Stair training;Skin care/wound management;Patient/family education;Neuromuscular re-education;Functional electrical stimulation;Disease management/prevention;Community reintegration;Balance/vestibular training PT Transfers Anticipated Outcome(s): min a with LRAD PT Locomotion Anticipated Outcome(s): supervision w/c, min a gait with LRAD PT Recommendation Recommendations for Other Services: None Follow Up Recommendations: Home health PT Patient destination: Home Equipment Recommended: To be determined   PT Evaluation Precautions/Restrictions Precautions Precautions: Fall;Other (comment) Precaution/Restrictions Comments: L homonymous hemianopsia, watch HR Restrictions Weight Bearing Restrictions Per Provider Order: No General   Vital SignsTherapy Vitals Pulse Rate: 81 BP: 135/61 Patient Position (if appropriate): Lying Pain Pain Assessment Pain Scale: 0-10 Pain Score: 0-No pain Pain Interference Pain Interference Pain Effect on Sleep: 1. Rarely or not at all Pain Interference with Therapy Activities: 1. Rarely or not at all Pain Interference with  Day-to-Day Activities: 1. Rarely or not at all Home Living/Prior Functioning Home Living Available Help at Discharge: Family;Available 24 hours/day Type of Home: Apartment Home Access: Level entry Home Layout: One level Bathroom Shower/Tub: Tub/shower unit;Curtain Bathroom Toilet: Standard Bathroom Accessibility: Yes Additional Comments: Assist provided for house work, finances, medication, meal prep, transportion.  Pt reports that she was able to get dressed and bathed normally if energy level was good.  Lives With: Son;Other (Comment) (son and DIL, they work opposing shifts so someone is home but sleeping) Prior Function Level of Independence: Requires assistive device for independence (was using rollator)  Able to Take Stairs?: Yes (1-2, not full flight) Driving: No Vocation: Retired Optometrist - History Ability to See in Adequate Light: 1 Impaired Vision - Assessment Additional Comments: Baseline Left side visual deficits. Unknown if it's a true visual cut. Pt reports that her left eye vision is normally blurry. Perception Perception: Impaired Preception Impairment Details: Inattention/Neglect Perception-Other Comments: L side Praxis Praxis: Impaired Praxis Impairment Details: Motor planning  Cognition Overall Cognitive Status: Impaired/Different from baseline Arousal/Alertness: Awake/alert Orientation Level: Oriented X4 Month: November Day of Week: Incorrect Attention: Sustained Memory: Impaired Memory Impairment: Decreased short term memory;Retrieval deficit;Decreased recall of new information;Storage deficit Awareness: Impaired Problem Solving: Impaired Safety/Judgment: Impaired Comments: Forgot that she had breakfast this morning and told both nursing and OT that she hadn't. OT found breakfast ticket in bed from this morning. Sensation Sensation Light Touch: Impaired by gross assessment Hot/Cold: Appears Intact Proprioception: Impaired by gross  assessment Stereognosis: Not tested Additional Comments: Pt with decreased sensation in L thigh and knee, but reports sensation the same BIL below knee Coordination Gross Motor Movements are Fluid and Coordinated: No Fine Motor Movements are Fluid and Coordinated: No Finger Nose Finger Test: impaired Motor  Motor Motor: Hemiplegia;Motor apraxia Motor - Skilled Clinical Observations: L hemi and apraxia   Trunk/Postural Assessment  Cervical Assessment Cervical Assessment: Within Functional Limits Thoracic Assessment Thoracic Assessment: Within Functional Limits Lumbar Assessment Lumbar Assessment: Within Functional Limits Postural Control Postural Control: Deficits on evaluation  Balance  Balance Balance Assessed: Yes Static Sitting Balance Static Sitting - Balance Support: No upper extremity supported;Feet supported Static Sitting - Level of Assistance: 5: Stand by assistance Static Standing Balance Static Standing - Balance Support: Bilateral upper extremity supported;During functional activity Static Standing - Level of Assistance: 4: Min assist Extremity Assessment  RUE Assessment RUE Assessment: Exceptions to [facility] Active Range of Motion (AROM) Comments: Impaired shoulder mobility d/t chronic arthritis. full A/ROM elbow, wrist, hand General Strength Comments: shoulder flexion/abduction 3-/5, 3/5 IR/er, 4/5 elbow flexion, functional gross grasp LUE Assessment Active Range of Motion (AROM) Comments: Impaired shoulder mobility d/t baseline L hemi. full A/ROM elbow, wrist, hand General Strength Comments: shoulder flexion/abduction 3-/5, 3/5 IR/er, 4/5 elbow flexion, functional gross grasp RLE Assessment RLE Assessment: Exceptions to [facility] General Strength Comments: Hip and ankle 4/5, knee 3+/5 limited by pain>strength LLE Assessment LLE Assessment: Exceptions to [facility] General Strength Comments: hip 3+/f, knee ext 2/5, knee flex 3+/5. DF 2/5, PF 3+/5, pt blames knee ext srength on  knee problems vs stroke  Care Tool Care Tool Bed Mobility Roll left and right activity   Roll left and right assist level: Contact Guard/Touching assist    Sit to lying activity   Sit to lying assist level: Moderate Assistance - Patient 50 - 74%    Lying to sitting on side of bed activity   Lying to sitting on side of bed assist level: the ability to move from lying on the back to sitting on the side of the bed with no back support.: Moderate Assistance - Patient 50 - 74%     Care Tool Transfers Sit to stand transfer   Sit to stand assist level: Moderate Assistance - Patient 50 - 74%    Chair/bed transfer   Chair/bed transfer assist level: Moderate Assistance - Patient 50 - 74%    Car transfer Car transfer activity did not occur: Safety/medical concerns        Care Tool Locomotion Ambulation   Assist level: 2 helpers Assistive device: Parallel bars Max distance: 6  Walk 10 feet activity Walk 10 feet activity did not occur: Safety/medical concerns       Walk 50 feet with 2 turns activity Walk 50 feet with 2 turns activity did not occur: Safety/medical concerns      Walk 150 feet activity Walk 150 feet activity did not occur: Safety/medical concerns      Walk 10 feet on uneven surfaces activity Walk 10 feet on uneven surfaces activity did not occur: Safety/medical concerns      Stairs Stair activity did not occur: Safety/medical concerns        Walk up/down 1 step activity Walk up/down 1 step or curb (drop down) activity did not occur: Safety/medical concerns      Walk up/down 4 steps activity Walk up/down 4 steps activity did not occur: Safety/medical concerns      Walk up/down 12 steps activity Walk up/down 12 steps activity did not occur: Safety/medical concerns      Pick up small objects from floor Pick up small object from the floor (from standing position) activity did not occur: Safety/medical concerns      Wheelchair Is the patient using a wheelchair?:  Yes Type of Wheelchair: Manual   Wheelchair assist level: Minimal Assistance - Patient > 75% Max wheelchair distance: 150  Wheel 50 feet with 2 turns activity   Assist Level: Minimal Assistance - Patient > 75%  Wheel 150 feet activity   Assist Level: Minimal Assistance -  Patient > 75%    Refer to Care Plan for Long Term Goals  SHORT TERM GOAL WEEK 1 PT Short Term Goal 1 (Week 1): Pt will perform STS with RW PT Short Term Goal 2 (Week 1): Pt will initiate bed/chair transfer with LRAD PT Short Term Goal 3 (Week 1): Pt will initiate gait with RW  Recommendations for other services: None   Skilled Therapeutic Intervention Evaluation completed (see details above) with patient education regarding purpose of PT evaluation, PT POC and goals, therapy schedule, weekly team meetings, and other CIR information including safety plan and fall risk safety. Pt with no pain at rest, unrated knee pain in standing. Declined intervention other than rest.  Pt performed the below functional mobility tasks with the specified levels of skilled cuing and assistance. Remained in w/c, was left with all needs in reach and alarm active.   Mobility Transfers Transfers: Sit to Stand Sit to Stand: Moderate Assistance - Patient 50-74% Transfer (Assistive device): Rolling walker Locomotion  Gait Ambulation: Yes Gait Assistance: 2 Helpers Gait Distance (Feet): 6 Feet Assistive device: Parallel bars Gait Gait: Yes Gait Pattern: Left genu recurvatum;Decreased dorsiflexion - right;Decreased dorsiflexion - left;Decreased hip/knee flexion - left;Decreased hip/knee flexion - right Gait velocity: decr Stairs / Additional Locomotion Stairs: No Wheelchair Mobility Wheelchair Mobility: Yes Wheelchair Assistance: Minimal assistance - Patient >75% Wheelchair Propulsion: Both upper extremities Wheelchair Parts Management: Needs assistance   Discharge Criteria: Patient will be discharged from PT if patient refuses  treatment 3 consecutive times without medical reason, if treatment goals not met, if there is a change in medical status, if patient makes no progress towards goals or if patient is discharged from hospital.  The above assessment, treatment plan, treatment alternatives and goals were discussed and mutually agreed upon: No family available/patient unable  Schuyler JAYSON Batter 03/30/2024, 12:53 PM

## 2024-03-30 NOTE — Progress Notes (Signed)
 PMR Admission Coordinator Pre-Admission Assessment   Patient: Megan Fischer is an 73 y.o., female MRN: [id number] DOB: 13-Apr-1951 Height: 5' 3 (160 cm) Weight: 81.3 kg   Insurance Information HMO: Yes   PPO:      PCP:      IPA:      80/20:      OTHER: Medicare: [id number] PRIMARY: [employer] Dual Complete      Policy#: [id number]       Subscriber: patient CM Name:       Phone#:      Fax#: [phone] Pre-Cert#: [id number] approved after peer to peer review on 03/28/24 to 04/03/24 per Chianu     Employer: Disabled Benefits:  Phone #: [phone]     Name: Verified with UHCprovider portal 03/26/24 Eff. Date: 06/09/23     Deduct: $257 (met $257)      Out of Pocket Max: [phone] (met [phone])      Life Max: n/a CIR: $1565 copay/admission      SNF: $0 for days 1-20; $209.50 for days 21-100 Outpatient: 80%     Co-Pay: 20% Home Health: 100%      Co-Pay: none DME: 80%     Co-Pay: 20% Providers: in network  SECONDARY: Medicaid of [place]      Policy#: [id number]      Phone#:    Financial Counselor:       Phone#:    The Engineer, Materials Information Summary" for patients in Inpatient Rehabilitation Facilities with attached "Privacy Act Statement-Health Care Records" was provided and verbally reviewed with: n/a   Emergency Contact Information Contact Information       Name Relation Home Work Mobile    [place] Son     [phone]    Tanda Deforest Daughter     [phone]         Other Contacts       Name Relation Home Work Mobile    [name],[name] Other     [phone]           Current Medical History  Patient Admitting Diagnosis: R CVA   History of Present Illness: [name] is a 73 y.o. 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 CTA  significant for subsegmental PE. MRI head with R MCA CVA and petechial hemorrhage in territory of recent R PCA CVA. 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 Marked decreased caliber opacification throughout the right MCA territory, suggesting low perfusion. Moderate focal stenosis of the distal right P2 segment with asymmetric attenuation of distal right PCA branches.2D Echo EF 50 to 55%, RV function is mildly reduced. Eliquis  (apixaban ) daily prior to admission, on IV heparin  this admission. Seen by PT/OT/SLP and they recommend CIR to assist return to PLOF.    Complete NIHSS TOTAL: 3   Patient's medical record from [facility] has been reviewed by the rehabilitation admission coordinator and physician.   Past Medical History      Past Medical History:  Diagnosis Date   Atrial fibrillation (HCC)     Chronic disease anemia 03/29/2011   Diabetes mellitus     Dyslipidemia     Hypertension     Obesities, morbid (HCC)     Osteoarthritis  Has the patient had major surgery during 100 days prior to admission? No   Family History   family history includes Diabetes in her mother; Heart attack in her mother; Hypertension in her brother; Stroke in her mother.   Current Medications  Current Medications    Current Facility-Administered Medications:    acetaminophen  (TYLENOL ) tablet 650 mg, 650 mg, Oral, Q6H PRN, [name], DO, 650 mg at 03/29/24 0405   apixaban  (ELIQUIS ) tablet 5 mg, 5 mg, Oral, BID, Prunty, Donald B, DO, 5 mg at 03/28/24 2204   aspirin  chewable tablet 81 mg, 81 mg, Oral, Daily, Prunty, Donald B, DO, 81 mg at 03/28/24 1100   atorvastatin  (LIPITOR) tablet 40 mg, 40 mg, Oral, Daily, Prunty, Donald B, DO, 40 mg at 03/28/24 1739   ezetimibe  (ZETIA ) tablet 10 mg, 10 mg, Oral, Daily, Prunty, Donald B, DO, 10 mg at 03/28/24 1059   famotidine  (PEPCID ) tablet 20 mg, 20 mg, Oral, Daily, Prunty, Donald  B, DO, 20 mg at 03/28/24 1059   feeding supplement (BOOST / RESOURCE BREEZE) liquid 1 Container, 1 Container, Oral, TID BM, Shawn Sick, MD, 1 Container at 03/28/24 2204   hydrOXYzine  (ATARAX ) tablet 10 mg, 10 mg, Oral, TID PRN, Kandis Perkins, DO, 10 mg at 03/29/24 0405   insulin  aspart (novoLOG ) injection 0-5 Units, 0-5 Units, Subcutaneous, QHS, Gomez-Caraballo, Maria, MD   insulin  aspart (novoLOG ) injection 0-6 Units, 0-6 Units, Subcutaneous, TID WC, Gomez-Caraballo, Maria, MD   LORazepam  (ATIVAN ) tablet 1 mg, 1 mg, Oral, Once PRN, Prunty, Donald B, DO   menthol  (CEPACOL) lozenge 3 mg, 1 lozenge, Oral, PRN, Tan, Dawson, MD, 3 mg at 03/25/24 2122   methimazole  (TAPAZOLE ) tablet 10 mg, 10 mg, Oral, Daily, Prunty, Donald B, DO, 10 mg at 03/28/24 1100   metoprolol  tartrate (LOPRESSOR ) tablet 25 mg, 25 mg, Oral, BID, Prunty, Donald B, DO, 25 mg at 03/28/24 2204   ondansetron  (ZOFRAN ) injection 4 mg, 4 mg, Intravenous, Q8H PRN, Bender, Emily, DO, 4 mg at 03/20/24 0246   polyethylene glycol (MIRALAX  / GLYCOLAX ) packet 17 g, 17 g, Oral, Daily PRN, McLendon, Michael, MD   senna (SENOKOT) tablet 17.2 mg, 2 tablet, Oral, Daily, Prunty, Donald B, DO, 17.2 mg at 03/28/24 1059   sodium chloride  flush (NS) 0.9 % injection 10-40 mL, 10-40 mL, Intracatheter, Q12H, Eben Reyes BROCKS, MD, 10 mL at 03/28/24 2127   sodium chloride  flush (NS) 0.9 % injection 10-40 mL, 10-40 mL, Intracatheter, PRN, Eben Reyes BROCKS, MD     Patients Current Diet:  Diet Order                  Diet regular Room service appropriate? No; Fluid consistency: Thin  Diet effective now                         Precautions / Restrictions Precautions Precautions: Fall, Other (comment) Precaution/Restrictions Comments: L homonymous hemianopsia, watch HR Restrictions Weight Bearing Restrictions Per Provider Order: No    Has the patient had 2 or more falls or a fall with injury in the past year? Yes   Prior Activity  Level Limited Community (1-2x/wk): Independent   Prior Functional Level Self Care: Did the patient need help bathing, dressing, using the toilet or eating? Needed some help   Indoor Mobility: Did the patient need assistance with walking from room to room (with or without device)? Needed some help   Stairs: Did the patient need assistance with internal or  external stairs (with or without device)? Needed some help   Functional Cognition: Did the patient need help planning regular tasks such as shopping or remembering to take medications? Needed some help   Patient Information Are you of Hispanic, Latino/a,or Spanish origin?: A. No, not of Hispanic, Latino/a, or Spanish origin What is your race?: B. Black or African American Do you need or want an interpreter to communicate with a doctor or health care staff?: 0. No   Patient's Response To:  Health Literacy and Transportation Is the patient able to respond to health literacy and transportation needs?: Yes Health Literacy - How often do you need to have someone help you when you read instructions, pamphlets, or other written material from your doctor or pharmacy?: Never In the past 12 months, has lack of transportation kept you from medical appointments or from getting medications?: No In the past 12 months, has lack of transportation kept you from meetings, work, or from getting things needed for daily living?: No   Journalist, Newspaper / Equipment Home Equipment: Grab bars - tub/shower, Medical Laboratory Scientific Officer - single point, Toilet riser   Prior Device Use: Indicate devices/aids used by the patient prior to current illness, exacerbation or injury? Walker   Current Functional Level Cognition   Arousal/Alertness: Awake/alert Overall Cognitive Status: Impaired/Different from baseline Orientation Level: Oriented X4 Attention: Sustained Sustained Attention: Impaired Sustained Attention Impairment: Verbal basic, Functional basic Memory: Impaired Memory  Impairment: Decreased short term memory, Retrieval deficit, Decreased recall of new information Decreased Short Term Memory: Verbal basic, Functional basic Awareness: Impaired Awareness Impairment: Anticipatory impairment Problem Solving: Impaired Problem Solving Impairment: Verbal basic, Functional basic Behaviors: Impulsive, Restless, Perseveration Safety/Judgment: Impaired Comments: Attempting to remove tubes/TF; has mitts    Extremity Assessment (includes Sensation/Coordination)   Upper Extremity Assessment: RUE deficits/detail, LUE deficits/detail (initiiates movement with BUE) RUE Deficits / Details: overall functionally, 3+/5 globally LUE Deficits / Details: overall functional, 3/5 globally  Lower Extremity Assessment: Defer to PT evaluation RLE Deficits / Details: Moves spontaneously, at least 3/5 LLE Deficits / Details: Appears slightly weaker than R, grossly 2-/5     ADLs   Overall ADL's : Needs assistance/impaired Eating/Feeding: NPO Grooming: Moderate assistance Upper Body Bathing: Moderate assistance Lower Body Bathing: Maximal assistance Upper Body Dressing : Moderate assistance Lower Body Dressing: Maximal assistance Toilet Transfer: Moderate assistance, +2 for physical assistance, +2 for safety/equipment, Maximal assistance, Stand-pivot Toilet Transfer Details (indicate cue type and reason): mod A +2 for rise. initially mod A to progress LLE, but with fatigue, max A for BLE progression to chair Functional mobility during ADLs: Moderate assistance, +2 for safety/equipment, +2 for physical assistance General ADL Comments: patient decliend self care tasks, I already did all that     Mobility   Overal bed mobility: Needs Assistance Bed Mobility: Supine to Sit Rolling: Total assist Supine to sit: Min assist, HOB elevated General bed mobility comments: able to move LE's off EOB with MinA to raise trunk     Transfers   Overall transfer level: Needs  assistance Equipment used: Rolling walker (2 wheels) Transfers: Sit to/from Stand, Bed to chair/wheelchair/BSC Sit to Stand: Mod assist, +2 physical assistance, +2 safety/equipment Bed to/from chair/wheelchair/BSC transfer type:: Step pivot Stand pivot transfers: Max assist, +2 safety/equipment, +2 physical assistance Step pivot transfers: Min assist, +2 safety/equipment General transfer comment: ModAx2 to boost-up with cueing for hand placement. Able to step-pivot with MinA to assist with RW management and +2 for safety     Ambulation / Gait /  Stairs / Psychologist, [employer]   Ambulation/Gait Ambulation/Gait assistance: Min assist, +2 safety/equipment Gait Distance (Feet): 6 Feet Assistive device: Rolling walker (2 wheels) Gait Pattern/deviations: Step-through pattern, Decreased stride length, Trunk flexed General Gait Details: slow and slightly unsteady steps with heavy UE reliance. Cues for upright posture as pt tended to lean forwards. Able to step 26ft forwards and backwards with MinA for steadying assist and occasional assist to move RW Gait velocity: decr     Posture / Balance Dynamic Sitting Balance Sitting balance - Comments: MinA/CGA with occasional posterior lean Balance Overall balance assessment: Needs assistance Sitting-balance support: Feet supported, No upper extremity supported Sitting balance-Leahy Scale: Fair Sitting balance - Comments: MinA/CGA with occasional posterior lean Postural control: Posterior lean Standing balance support: Bilateral upper extremity supported, During functional activity, Reliant on assistive device for balance Standing balance-Leahy Scale: Poor Standing balance comment: reliant on UE and external support     Special considerations/life events  Special service needs none    Previous Home Environment (from acute therapy documentation) Living Arrangements: Children  Lives With: Son, Family Available Help at Discharge: Family, Available 24  hours/day Type of Home: Apartment Home Layout: One level Home Access: Level entry Bathroom Shower/Tub: Engineer, Manufacturing Systems: Standard Bathroom Accessibility: Yes How Accessible: Accessible via walker Home Care Services: No Additional Comments: info taken from prior admission   Discharge Living Setting Plans for Discharge Living Setting: Patient's home Type of Home at Discharge: Apartment Discharge Home Layout: One level Discharge Home Access: Level entry Discharge Bathroom Shower/Tub: Tub/shower unit Discharge Bathroom Toilet: Standard Discharge Bathroom Accessibility: Yes How Accessible: Accessible via walker Does the patient have any problems obtaining your medications?: No   Social/Family/Support Systems Patient Roles: Spouse Contact Information: son is her caregiver Anticipated Caregiver: Son, DIL, Daughter Anticipated Caregiver's Contact Information: Daughter Mylinda is best contact Ability/Limitations of Caregiver: None stated Caregiver Availability: 24/7 Discharge Plan Discussed with Primary Caregiver: Yes Is Caregiver In Agreement with Plan?: Yes   Goals Patient/Family Goal for Rehab: PT/OT/SLP min A Expected length of stay: 12-16 days Pt/Family Agrees to Admission and willing to participate: Yes Program Orientation Provided & Reviewed with Pt/Caregiver Including Roles  & Responsibilities: Yes   Decrease burden of Care through IP rehab admission: Not anticipated   Possible need for SNF placement upon discharge: Not anticipated   Patient Condition: I have reviewed medical records from [facility], spoken with CM, and patient. I met with patient at the bedside for inpatient rehabilitation assessment.  Patient will benefit from ongoing PT, OT, and SLP, can actively participate in 3 hours of therapy a day 5 days of the week, and can make measurable gains during the admission.  Patient will also benefit from the coordinated team approach during  an Inpatient Acute Rehabilitation admission.  The patient will receive intensive therapy as well as Rehabilitation physician, nursing, social worker, and care management interventions.  Due to safety, skin/wound care, disease management, medication administration, pain management, and patient education the patient requires 24 hour a day rehabilitation nursing.  The patient is currently min to mod assist with mobility and basic ADLs.  Discharge setting and therapy post discharge at skilled nursing facility is anticipated.  Patient has agreed to participate in the Acute Inpatient Rehabilitation Program and will admit tomorrow.   Preadmission Screen Completed By:  Lovett CHRISTELLA Ropes, 03/29/2024 8:40 AM ______________________________________________________________________   Discussed status with Dr. Urbano on 03/28/24 at 1400 and received approval for admission tomorrow.   Admission Coordinator:  Lovett CHRISTELLA Ropes, RN, time 1630/Date 03/28/24    Assessment/Plan: Diagnosis: RIght MCA infarct  Does the need for close, 24 hr/day Medical supervision in concert with the patient's rehab needs make it unreasonable for this patient to be served in a less intensive setting? Yes Co-Morbidities requiring supervision/potential complications: Afib, CKD 4 , Recent R PCA infarct  Due to bladder management, bowel management, safety, skin/wound care, disease management, medication administration, pain management, and patient education, does the patient require 24 hr/day rehab nursing? Yes Does the patient require coordinated care of a physician, rehab nurse, PT, OT, and SLP to address physical and functional deficits in the context of the above medical diagnosis(es)? Yes Addressing deficits in the following areas: balance, endurance, locomotion, strength, transferring, bowel/bladder control, bathing, dressing, and toileting Can the patient actively participate in an intensive therapy program of at least 3 hrs of therapy  5 days a week? Yes The potential for patient to make measurable gains while on inpatient rehab is good Anticipated functional outcomes upon discharge from inpatient rehab: min assist PT, min assist OT, min assist SLP Estimated rehab length of stay to reach the above functional goals is: 12-16d Anticipated discharge destination: Home 10. Overall Rehab/Functional Prognosis: good     MD Signature: Prentice CHARLENA Compton M.D. [facility] Fellow Am Acad of Phys Med and Rehab Diplomate Am Board of Electrodiagnostic Med Fellow Am Board of Interventional Pain           Revision History  Date/Time User Provider Type Action  03/29/2024 12:03 PM Kirsteins, Prentice BRAVO, MD Physician Sign  03/29/2024  8:40 AM Ropes Lovett CHRISTELLA, RN Rehab Admission Coordinator Share  03/28/2024  4:33 PM Ropes Lovett CHRISTELLA, RN Rehab Admission Coordinator Share  03/28/2024  4:06 PM Ropes Lovett CHRISTELLA, RN Rehab Admission Coordinator Share  03/26/2024  3:25 PM Ropes Lovett CHRISTELLA, RN Rehab Admission Coordinator Share  03/25/2024  9:46 AM Cecilie, Leita NOVAK, CCC-SLP Rehab Admission Coordinator Share   View Details Report

## 2024-03-30 NOTE — Evaluation (Signed)
 Speech Language Pathology Assessment and Plan  Patient Details  Name: Megan Fischer MRN: [id number] Date of Birth: 1950/06/05  SLP Diagnosis: Cognitive Impairments  Rehab Potential: Good ELOS: 12-14 days    Today's Date: 03/30/2024 SLP Individual Time: 1330-1415 SLP Individual Time Calculation (min): 45 min   Hospital Problem: Principal Problem:   Acute ischemic right MCA stroke [facility])  Past Medical History:  Past Medical History:  Diagnosis Date   Atrial fibrillation (HCC)    Chronic disease anemia 03/29/2011   Diabetes mellitus    Dyslipidemia    Hypertension    Obesities, morbid (HCC)    Osteoarthritis    Past Surgical History:  Past Surgical History:  Procedure Laterality Date   CHOLECYSTECTOMY     REPLACEMENT TOTAL KNEE BILATERAL     SHOULDER SURGERY     RIGHT SHOULDER   TUBAL LIGATION      Assessment / Plan / Recommendation Clinical Impression  Per H&P: Megan Fischer is a 73 year old female with history of HTN, T2DM, A fib, giant cell tumor of left tibia, multinodular goiter, CKD 4, recent R-PCA stroke w/CIR who was admitted on 03/18/24 with 3 day history of headache, restlessness and confusion. CT/A/P ordered due to intractable N/V in ED and showed suspicion of pyelonephritis and noted to have elevated lactate 3.9 and euglycemic DKA.   Thyroid  function studies showed t TH ,0.1 with free T4 1.96 and question of thyrotoxicosis and methimazole  added.  CTA A/P negative for ischemia or other acute findings. CT head negative for acute changes. She was treated with DKA protocol. She developed LE edema and rise in BNP. Limited 2 D echo done revealing normal ef with severely elevated PAH.  CTA chest and MRI brain recommended by cardiology due to ongoing issue with confusion and lethargy. She was found to have submassive PE  with right heart strain and new scattered LUL  pulmonary nodules  of indeterminate nature. MRI brain revealed new acute R-MCA infarct with extensive  subacute R-PCA infarct and diminished flow R-ICA c/w known occlusion. Neurology also consulted and recommended ASA with holding of of heparin  in 1-2 days due to evidence of petechial hemorrhage at site of new stroke. Question stroke from paradoxical embolus from PE.  MRI tib/fib ordered which was negative for recurrent tumor with question of pes anserine bursitis. BLE dopplers negative for DVT. Pan CT negative for mets. LT-EEG negative for seizures. GOC discussed with family who confirmed DNR and treating treatable pending outcomes.  Mentation has improved and she has been advanced to regular. Heparin  added per stroke protocol and eliquis  resumed. Mentation improving but she continues to have left inattention, cognitive deficits, motor planning deficits and weakness. She requires min to mod assist with mobility and ADLs. CIR recommended due to functional decline.  Pt known to ST service - previously d/c'd from CIR on 02/14/24 having met 3 of 3 LTGs and at a supervision/min level for memory, sustained attention, and basic problem solving. She required up to mod assist for new learning, perception/L inattention, and anticipatory awareness at that time.  Bedside Swallow Evaluation:  A bedside swallow evaluation was completed to assess oropharyngel function and risk for aspiration. Pt's current diet is reg/thin. Baseline diet is reg/thin. Oral mech unremarkable; pt wears upper and lower dentures. Pt denies odynophagia, hx of PNA, globus sensation, difficulty with certain foods/liquids, or difficulty with mastication. Endorses heartburn for which she takes antacids. PO trials of regular graham cracker, Dys 3 fig newton and thin liquid water   via cup and straw were conducted. Pt self-administered trials. No overt s/sx of aspiration or oral cavity residue observed. Pt presents with [facility] oropharyngeal swallowing as determined at bedside. Recommend continue reg/thin with general swallow precautions.     Speech/Cognitive-Linguistic Evaluation: Patient was seen for a speech/language evaluation assessing her voice, speech, resonance, expressive/receptive language, and cognitive communication skills. Voice, speech, and resonance WNL. Pt lives at home with her son. He provides assistance for iADLs since her previous CVA. He reports that her cognition has improved greatly over the past few days but she does not appear to be back at baseline (previous d/c from CIR). Pt oriented to place, month, and year but not to date, DOW, or situation (believed she was in the hospital from previous CVA and did not realize she had a second one). Reports vision changes - blurriness and a hole in L eye.  The [employer] Mental Status (SLUMS) Examination was administered with the following results: Task Score  Education Level 12th   Orientation 2/3  Numeric Calculations  3/3  Generative Naming with Time Constraint  1/3 (named 8 animals, stated this was difficult for her and different than baseline)  Delayed Recall with Interference 0/5 after 5 minute delay  Of note, patient immediately recalled 4/5.  Digit Span 1/2  Clock Drawing 0/4  Following Commands/Visual Spatial  0/2  Auditory Comprehension Questions (after short story) 8/8  TOTAL 15/30    Pt presents with mod-severe cognitive-linguistic deficits in STM, working memory, executive functioning, visuospatial skills, and verbal fluency. Recommend ST while at CIR to target cognitive-linguistic skills.      Skilled Therapeutic Interventions          A bedside swallow evaluation and cognitive-linguistic evaluation were completed. Please see above.   SLP Assessment  Patient will need skilled Speech Lanaguage Pathology Services during CIR admission    Recommendations  Medication Administration: Whole meds with liquid Supervision: Patient able to self feed Compensations: Slow rate;Small sips/bites;Minimize environmental distractions Postural Changes  and/or Swallow Maneuvers: Seated upright 90 degrees Oral Care Recommendations: Oral care BID Patient destination: Home Follow up Recommendations: None Equipment Recommended: None recommended by SLP    SLP Frequency 3 to 5 out of 7 days   SLP Duration  SLP Intensity  SLP Treatment/Interventions  12-14 days   Minumum of 1-2 x/day, 30 to 90 minutes  Cognitive remediation/compensation;Functional tasks;Patient/family education;Internal/external aids;Cueing hierarchy;Medication managment;Therapeutic Activities;Therapeutic Exercise    Pain Pain Assessment Pain Scale: 0-10 Pain Score: 0-No pain  Prior Functioning Cognitive/Linguistic Baseline: Baseline deficits Baseline deficit details: Previously in CIR;cog/ling tx Type of Home: Apartment  Lives With: Son Available Help at Discharge: Family;Available 24 hours/day Vocation: Retired  Architectural Technologist Overall Cognitive Status: Impaired/Different from baseline Arousal/Alertness: Awake/alert Orientation Level: Oriented X4 Year: 2025 Month: November Day of Week: Incorrect Attention: Sustained Sustained Attention: Impaired Memory: Impaired Memory Impairment: Decreased short term memory;Retrieval deficit;Decreased recall of new information;Storage deficit Awareness: Impaired Awareness Impairment: Anticipatory impairment;Emergent impairment Problem Solving: Impaired Executive Function: Organizing;Self Monitoring Organizing: Impaired Organizing Impairment: Functional basic;Functional complex Self Monitoring: Impaired Safety/Judgment: Impaired  Comprehension Auditory Comprehension Overall Auditory Comprehension: Impaired at baseline Yes/No Questions: Within Functional Limits Commands: Within Functional Limits Interfering Components: Working memory;Processing speed EffectiveTechniques: Repetition;Visual/Gestural cues It Trainer Discrimination: Not tested Reading Comprehension Reading Status: Not  tested Expression Expression Primary Mode of Expression: Verbal Verbal Expression Overall Verbal Expression: Appears within functional limits for tasks assessed Initiation: No impairment Level of Generative/Spontaneous Verbalization: Sentence Naming: Impairment Confrontation: Impaired Divergent: 25-49%  accurate Verbal Errors: Aware of errors;Phonemic paraphasias Interfering Components: Premorbid deficit;Attention Non-Verbal Means of Communication: Not applicable Written Expression Dominant Hand: Right Written Expression: Within Functional Limits Oral Motor Oral Motor/Sensory Function Overall Oral Motor/Sensory Function: Within functional limits Motor Speech Overall Motor Speech: Appears within functional limits for tasks assessed Respiration: Within functional limits Phonation: Normal Resonance: Within functional limits Articulation: Within functional limitis Intelligibility: Intelligible Motor Planning: Within functional limits Motor Speech Errors: Not applicable  Care Tool Care Tool Cognition Ability to hear (with hearing aid or hearing appliances if normally used Ability to hear (with hearing aid or hearing appliances if normally used): 0. Adequate - no difficulty in normal conservation, social interaction, listening to TV   Expression of Ideas and Wants Expression of Ideas and Wants: 4. Without difficulty (complex and basic) - expresses complex messages without difficulty and with speech that is clear and easy to understand   Understanding Verbal and Non-Verbal Content Understanding Verbal and Non-Verbal Content: 4. Understands (complex and basic) - clear comprehension without cues or repetitions  Memory/Recall Ability Memory/Recall Ability : Current season;That he or she is in a hospital/hospital unit   Intelligibility: Intelligible  Bedside Swallowing Assessment General Diet Prior to this Study: Regular;Thin liquids (Level 0) Temperature Spikes Noted: No Respiratory  Status: Room air History of Recent Intubation: No Behavior/Cognition: Alert;Cooperative;Requires cueing Oral Cavity - Dentition: Dentures, top;Dentures, bottom Self-Feeding Abilities: Needs set up Vision: Functional for self-feeding Patient Positioning: Upright in bed Baseline Vocal Quality: Normal Volitional Cough: Strong Volitional Swallow: Able to elicit  Oral Care Assessment Oral Assessment  (WDL): Within Defined Limits Lips: Symmetrical Teeth: Dentures upper;Dentures lower Tongue: Pink;Moist Mucous Membrane(s): Moist;Pink Saliva: Moist, saliva free flowing Level of Consciousness: Alert Is patient on any of following O2 devices?: None of the above Nutritional status: No high risk factors Oral Assessment Risk : Low Risk Ice Chips   Thin Liquid Thin Liquid: Within functional limits Presentation: Cup;Spoon;Straw Nectar Thick Nectar Thick Liquid: Not tested Honey Thick Honey Thick Liquid: Not tested Puree Puree: Not tested Solid Solid: Within functional limits Presentation: Self Fed BSE Assessment Risk for Aspiration Impact on safety and function: No limitations Other Related Risk Factors: Cognitive impairment;Deconditioning;Lethargy  Short Term Goals: Week 1: SLP Short Term Goal 1 (Week 1): Pt will recall functional daily information with 80% acc using compensatory memory strategies when provided mod A. SLP Short Term Goal 2 (Week 1): Pt will complete generative naming tasks using circumlocution and mod A. SLP Short Term Goal 3 (Week 1): Pt will complete functional problem solving when provided mod-max A.  Refer to Care Plan for Long Term Goals  Recommendations for other services: None   Discharge Criteria: Patient will be discharged from SLP if patient refuses treatment 3 consecutive times without medical reason, if treatment goals not met, if there is a change in medical status, if patient makes no progress towards goals or if patient is discharged from  hospital.  The above assessment, treatment plan, treatment alternatives and goals were discussed and mutually agreed upon: by patient  Waddell JONETTA Novak, MA CCC-SLP 03/30/2024, 4:06 PM

## 2024-03-30 NOTE — Progress Notes (Signed)
 Physical Medicine and Rehabilitation Consult Reason for Consult: Rehab Referring Physician: Dr. Lera     HPI: Megan Fischer is a 73 y.o. female with past medical history of hypertension, diabetes mellitus type 2, A-fib, CKD 4, prior right PCA stroke who presented to the hospital with worsening headache and confusion and was admitted for encephalopathy.  Patient was discharged on 02/14/2024 previously from CIR for right PCA CVA involving the right thalamus.  MRI of the brain showed new areas of cortical restricted diffusion along the right insula, mesial right temporal lobe suspicious for acute MCA territory infarct in addition to subacute infarct in the PCA territory.  CTA with known right ICA occlusion.  Neurology feels like right MCA CVA likely due to chronic right ICA occlusion in setting of hypotension, volume depletion and DKA.  Started on heparin  for right lower lobe PE.  Followed by SLP on dysphagia 2 thin diet and also has core track.  Patient evaluated by PT and OT felt to be a candidate for CIR.   Per chart review patient lives in an [place]. 1 level apartment with level entry.  Family can assist 24/7 after discharge.       Home: Home Living Family/patient expects to be discharged to:: Private residence Living Arrangements: Children Available Help at Discharge: Family, Available 24 hours/day Type of Home: Apartment Home Access: Level entry Home Layout: One level Bathroom Shower/Tub: Engineer, Manufacturing Systems: Standard Bathroom Accessibility: Yes Home Equipment: Grab bars - tub/shower, Cane - single point, Toilet riser Additional Comments: info taken from prior admission  Lives With: Son, Family  Functional History: Prior Function Prior Level of Function : Independent/Modified Independent Mobility Comments: cane PRN ADLs Comments: son assists with ADLs Functional Status:  Mobility: Bed Mobility Overal bed mobility: Needs Assistance Bed Mobility: Supine to Sit Rolling:  Total assist Supine to sit: Mod assist General bed mobility comments: initially attempting to get out of R side of bed needing dense cueing to bring BLE to EOB on the L. Assist also needed for trunk and hip management to EOB. Transfers Overall transfer level: Needs assistance Equipment used: Rolling walker (2 wheels), 2 person hand held assist Transfers: Sit to/from Stand, Bed to chair/wheelchair/BSC Sit to Stand: Mod assist, +2 physical assistance, +2 safety/equipment Bed to/from chair/wheelchair/BSC transfer type:: Stand pivot Stand pivot transfers: Max assist, +2 safety/equipment, +2 physical assistance General transfer comment: Pt needing modA +2 for STS and maxA +2 for stand pivot transfer. Pt with difficulty taking lateral steps at EOB with R LE even with verbal and tactile cueing. B LE requiring assist for progression for pivot into chair   ADL: ADL Overall ADL's : Needs assistance/impaired Eating/Feeding: NPO Grooming: Moderate assistance Upper Body Bathing: Moderate assistance Lower Body Bathing: Maximal assistance Upper Body Dressing : Moderate assistance Lower Body Dressing: Maximal assistance Toilet Transfer: Moderate assistance, +2 for physical assistance, +2 for safety/equipment, Maximal assistance, Stand-pivot Toilet Transfer Details (indicate cue type and reason): mod A +2 for rise. initially mod A to progress LLE, but with fatigue, max A for BLE progression to chair Functional mobility during ADLs: Moderate assistance, +2 for safety/equipment, +2 for physical assistance   Cognition: Cognition Overall Cognitive Status: Impaired/Different from baseline Arousal/Alertness: Awake/alert Orientation Level: Oriented to person Attention: Sustained Sustained Attention: Impaired Sustained Attention Impairment: Verbal basic, Functional basic Memory: Impaired Memory Impairment: Decreased short term memory, Retrieval deficit, Decreased recall of new information Decreased Short  Term Memory: Verbal basic, Functional basic Awareness: Impaired Awareness Impairment: Anticipatory impairment Problem  Solving: Impaired Problem Solving Impairment: Verbal basic, Functional basic Behaviors: Impulsive, Restless, Perseveration Safety/Judgment: Impaired Comments: Attempting to remove tubes/TF; has mitts Cognition Arousal: Alert Behavior During Therapy: Flat affect Overall Cognitive Status: Impaired/Different from baseline     Review of Systems  Reason unable to perform ROS: Limited by congnition.  Constitutional:  Negative for fever.  Respiratory:  Negative for shortness of breath.   Cardiovascular:  Negative for chest pain.       Past Medical History:  Diagnosis Date   Atrial fibrillation (HCC)     Chronic disease anemia 03/29/2011   Diabetes mellitus     Dyslipidemia     Hypertension     Obesities, morbid (HCC)     Osteoarthritis               Past Surgical History:  Procedure Laterality Date   CHOLECYSTECTOMY       REPLACEMENT TOTAL KNEE BILATERAL       SHOULDER SURGERY        RIGHT SHOULDER   TUBAL LIGATION                 Family History  Problem Relation Age of Onset   Heart attack Mother     Stroke Mother     Diabetes Mother     Hypertension Brother     Breast cancer Neg Hx          Social History:  reports that she has never smoked. She has never used smokeless tobacco. She reports that she does not drink alcohol and does not use drugs. Allergies:  Allergies       Allergies  Allergen Reactions   Aspirin  Nausea Only      Patient stated to neuro that it caused an upset stomach   Codeine Other (See Comments)      Unknown    Fentanyl  Other (See Comments)      hypoxia   Oxycodone Hcl Other (See Comments)      Unknown    Penicillins Other (See Comments)      Unknown    Chlorhexidine Gluconate Rash            Medications Prior to Admission  Medication Sig Dispense Refill   acetaminophen  (TYLENOL ) 325 MG tablet Take 1-2 tablets  (325-650 mg total) by mouth every 4 (four) hours as needed for mild pain (pain score 1-3).       amLODipine  (NORVASC ) 2.5 MG tablet Take 1 tablet (2.5 mg total) by mouth daily. 30 tablet 0   apixaban  (ELIQUIS ) 5 MG TABS tablet Take 1 tablet (5 mg total) by mouth 2 (two) times daily. 200 tablet 2   aspirin  EC 81 MG tablet Take 1 tablet (81 mg total) by mouth daily. Swallow whole. 30 tablet 0   benazepril  (LOTENSIN ) 20 MG tablet TAKE 1 TABLET BY MOUTH DAILY 100 tablet 2   CALCIUM  PO Take 1 tablet by mouth 2 (two) times daily.       camphor-menthol  (SARNA) lotion Apply topically as needed for itching. 222 mL 0   celecoxib  (CELEBREX ) 200 MG capsule Take 1 capsule (200 mg total) by mouth daily as needed for moderate pain (pain score 4-6). 30 capsule 0   diclofenac  Sodium (VOLTAREN ) 1 % GEL Apply 2 g topically 4 (four) times daily. To both knees 150 g 0   DULoxetine  (CYMBALTA ) 60 MG capsule Take 60 mg by mouth daily.       ezetimibe  (ZETIA ) 10 MG tablet Take 1 tablet (  10 mg total) by mouth daily. 90 tablet 2   famotidine  (PEPCID ) 20 MG tablet Take 1 tablet (20 mg total) by mouth daily. 30 tablet 0   gabapentin  (NEURONTIN ) 100 MG capsule Take 1 capsule (100 mg total) by mouth 3 (three) times daily. (Patient taking differently: Take 300 mg by mouth See admin instructions. 1-2 times daily only) 90 capsule 0   glimepiride  (AMARYL ) 1 MG tablet Take 1 tablet (1 mg total) by mouth daily with breakfast. 90 tablet 4   JARDIANCE  25 MG TABS tablet Take 25 mg by mouth daily.       lidocaine  (LIDODERM ) 5 % Place 2 patches onto the skin daily. Remove & Discard patch within 12 hours or as directed by MD 60 patch 0   methimazole  (TAPAZOLE ) 5 MG tablet TAKE 1 TABLET BY MOUTH DAILY 100 tablet 2   methocarbamol  (ROBAXIN ) 500 MG tablet Take 1 tablet (500 mg total) by mouth every 6 (six) hours as needed for muscle spasms. 30 tablet 0   metoprolol  succinate (TOPROL -XL) 50 MG 24 hr tablet TAKE 1 TABLET BY MOUTH DAILY  WITH OR  IMMEDIATELY FOLLOWING A  MEAL 90 tablet 3   Multiple Vitamins-Minerals (MULTIVITAMIN WITH MINERALS) tablet Take 1 tablet by mouth daily.       rosuvastatin  (CRESTOR ) 20 MG tablet Take 1 tablet (20 mg total) by mouth daily after supper. 30 tablet 0   Semaglutide , 2 MG/DOSE, (OZEMPIC , 2 MG/DOSE,) 8 MG/3ML SOPN INJECT SUBCUTANEOUSLY 2 MG EVERY WEEK 9 mL 3   Blood Glucose Monitoring Suppl DEVI 1 each by Does not apply route as directed. Dispense based on patient and insurance preference. Use up to four times daily as directed. (FOR ICD-10 E10.9, E11.9). 1 each 0   ferrous sulfate  325 (65 FE) MG EC tablet Take 1 tablet (325 mg total) by mouth 2 (two) times daily. (Patient not taking: Reported on 03/18/2024) 60 tablet 3   Glucose Blood (BLOOD GLUCOSE TEST STRIPS) STRP 1 each by Does not apply route as directed. Dispense based on patient and insurance preference. Use up to four times daily as directed. (FOR ICD-10 E10.9, E11.9). 100 strip 0   Lancet Device MISC 1 each by Does not apply route as directed. Dispense based on patient and insurance preference. Use up to four times daily as directed. (FOR ICD-10 E10.9, E11.9). 1 each 0   Lancets MISC 1 each by Does not apply route as directed. Dispense based on patient and insurance preference. Use up to four times daily as directed. (FOR ICD-10 E10.9, E11.9). 100 each 0            Blood pressure 123/86, pulse 73, temperature 97.9 F (36.6 C), temperature source Oral, resp. rate 18, height 5' 3 (1.6 m), weight 81.8 kg, SpO2 100%. Physical Exam   General: NAD, wearing mittens b/l , obese HEENT: Head is normocephalic, atraumatic, Cortrak in place Heart: IIR. No murmurs rubs or gallops Chest: CTA bilaterally Abdomen: Soft, non-tender, non-distended, bowel sounds positive. Extremities:  b/l LE edema present Psych: A little agitated Skin: Clean and intact without signs of breakdown Neuro:     Mental Status: AAO to person and hospital, able to say she had  a stroke.  Keeps eyes closed but will open them intermittently.  Very confused.  Limited cooperation with exam repeating you are not my big Ben  Speech/Languate: Fluent, intermittent following of commands  CRANIAL NERVES: Right gaze preference.  No facial droop noted.  Would not cooperate fully with  exam.  Appears to have some left neglect.     MOTOR: Would not cooperate with MMT but moving bilateral upper extremities to gravity.  Moving right lower extremity to gravity and minimal movements noted in left lower extremity.       Lab Results Last 24 Hours       Results for orders placed or performed during the hospital encounter of 03/18/24 (from the past 24 hours)  Glucose, capillary     Status: Abnormal    Collection Time: 03/25/24  3:51 PM  Result Value Ref Range    Glucose-Capillary 240 (H) 70 - 99 mg/dL  Glucose, capillary     Status: Abnormal    Collection Time: 03/25/24  9:36 PM  Result Value Ref Range    Glucose-Capillary 154 (H) 70 - 99 mg/dL  Glucose, capillary     Status: Abnormal    Collection Time: 03/26/24  6:59 AM  Result Value Ref Range    Glucose-Capillary 183 (H) 70 - 99 mg/dL  Heparin  level (unfractionated)     Status: None    Collection Time: 03/26/24  7:14 AM  Result Value Ref Range    Heparin  Unfractionated 0.38 0.30 - 0.70 IU/mL  CBC     Status: Abnormal    Collection Time: 03/26/24  7:14 AM  Result Value Ref Range    WBC 7.7 4.0 - 10.5 K/uL    RBC 4.07 3.87 - 5.11 MIL/uL    Hemoglobin 11.7 (L) 12.0 - 15.0 g/dL    HCT 65.0 (L) 63.9 - 46.0 %    MCV 85.7 80.0 - 100.0 fL    MCH 28.7 26.0 - 34.0 pg    MCHC 33.5 30.0 - 36.0 g/dL    RDW 84.4 88.4 - 84.4 %    Platelets 212 150 - 400 K/uL    nRBC 0.0 0.0 - 0.2 %  Basic metabolic panel     Status: Abnormal    Collection Time: 03/26/24  7:14 AM  Result Value Ref Range    Sodium 133 (L) 135 - 145 mmol/L    Potassium 5.1 3.5 - 5.1 mmol/L    Chloride 103 98 - 111 mmol/L    CO2 21 (L) 22 - 32 mmol/L     Glucose, Bld 228 (H) 70 - 99 mg/dL    BUN 31 (H) 8 - 23 mg/dL    Creatinine, Ser 8.66 (H) 0.44 - 1.00 mg/dL    Calcium  8.8 (L) 8.9 - 10.3 mg/dL    GFR, Estimated 42 (L) >60 mL/min    Anion gap 9 5 - 15  Glucose, capillary     Status: Abnormal    Collection Time: 03/26/24  7:46 AM  Result Value Ref Range    Glucose-Capillary 196 (H) 70 - 99 mg/dL  Glucose, capillary     Status: Abnormal    Collection Time: 03/26/24 11:19 AM  Result Value Ref Range    Glucose-Capillary 107 (H) 70 - 99 mg/dL      Imaging Results (Last 48 hours)  No results found.     Assessment/Plan: Diagnosis: Right MCA ischemic stroke with prior right PCA stroke in addition to encephalopathy. Does the need for close, 24 hr/day medical supervision in concert with the patient's rehab needs make it unreasonable for this patient to be served in a less intensive setting? Yes Co-Morbidities requiring supervision/potential complications:  - AKI, diabetes mellitus type 2, abnormal EEG, hypothyroidism, hyperlipidemia, hypertension, A-fib, PE, dysphagia, obesity, Due to bladder management, bowel management, safety,  skin/wound care, disease management, medication administration, pain management, and patient education, does the patient require 24 hr/day rehab nursing? Yes Does the patient require coordinated care of a physician, rehab nurse, therapy disciplines of PT, OT, SLP to address physical and functional deficits in the context of the above medical diagnosis(es)? Yes Addressing deficits in the following areas: balance, endurance, locomotion, strength, transferring, bowel/bladder control, bathing, dressing, feeding, grooming, toileting, cognition, speech, language, swallowing, and psychosocial support Can the patient actively participate in an intensive therapy program of at least 3 hrs of therapy per day at least 5 days per week? Yes The potential for patient to make measurable gains while on inpatient rehab is  excellent Anticipated functional outcomes upon discharge from inpatient rehab are min assist  with PT, min assist with OT, min assist with SLP. Estimated rehab length of stay to reach the above functional goals is: 12-16 Anticipated discharge destination: Home Overall Rehab/Functional Prognosis: good   POST ACUTE RECOMMENDATIONS: This patient's condition is appropriate for continued rehabilitative care in the following setting: CIR Patient has agreed to participate in recommended program. Yes Note that insurance prior authorization may be required for reimbursement for recommended care.   Comment: Think she would be a good candidate for CIR, rehab admissions coordinator follow-up.     I have personally performed a face to face diagnostic evaluation of this patient. Additionally, I have examined the patient's medical record including any pertinent labs and radiographic images.     Thanks,   Murray Collier, MD 03/26/2024           Routing History  Date/Time From To Method  03/26/2024  1:10 PM Collier Murray, MD Bernadine Manos, MD In Basket

## 2024-03-30 NOTE — Progress Notes (Signed)
 Occupational Therapy Session Note  Patient Details  Name: Megan Fischer MRN: [id number] Date of Birth: 27-Apr-1951  Today's Date: 03/30/2024 OT Individual Time: 8482-8454 OT Individual Time Calculation (min): 28 min    Short Term Goals: Week 1:  OT Short Term Goal 1 (Week 1): Pt will complete UB bathing and dressing with Mod A OT Short Term Goal 2 (Week 1): Pt will complete LB bathing and dressing with Mod A OT Short Term Goal 3 (Week 1): Pt will complete 2/3 toileting steps with VC to utilize LUE.  Skilled Therapeutic Interventions/Progress Updates:  Patient agreeable to participate in OT session. Reports 0/10 pain level.   Patient participated in skilled OT session focusing on functional transfers and bed mobility in order to increase safety awareness and functional performance during daily tasks.  Pt declined use of toilet at start of session.  Pt completed step pivot transfer from [facility] to bed with use of RW with Mod A and VC for hand placement on RW prior to sit to stand transition. Pt provided with VC for sequencing and safety awareness with RW management while increasing left side awareness.  Bed mobility completed with Min A for BLE management. VC for sequencing and technique.    Therapy Documentation Precautions:  Precautions Precautions: Fall, Other (comment) Precaution/Restrictions Comments: L homonymous hemianopsia, watch HR Restrictions Weight Bearing Restrictions Per Provider Order: No   Therapy/Group: Individual Therapy  Leita Howell, OTR/L,CBIS  Supplemental OT - [facility] and [facility] Secure Chat Preferred   03/30/2024, 12:38 PM

## 2024-03-31 DIAGNOSIS — N179 Acute kidney failure, unspecified: Secondary | ICD-10-CM | POA: Diagnosis not present

## 2024-03-31 DIAGNOSIS — N189 Chronic kidney disease, unspecified: Secondary | ICD-10-CM

## 2024-03-31 DIAGNOSIS — I10 Essential (primary) hypertension: Secondary | ICD-10-CM | POA: Diagnosis not present

## 2024-03-31 DIAGNOSIS — E871 Hypo-osmolality and hyponatremia: Secondary | ICD-10-CM

## 2024-03-31 DIAGNOSIS — E1165 Type 2 diabetes mellitus with hyperglycemia: Secondary | ICD-10-CM

## 2024-03-31 DIAGNOSIS — I63511 Cerebral infarction due to unspecified occlusion or stenosis of right middle cerebral artery: Secondary | ICD-10-CM | POA: Diagnosis not present

## 2024-03-31 LAB — CBC
HCT: 30.8 % — ABNORMAL LOW (ref 36.0–46.0)
Hemoglobin: 10 g/dL — ABNORMAL LOW (ref 12.0–15.0)
MCH: 28.7 pg (ref 26.0–34.0)
MCHC: 32.5 g/dL (ref 30.0–36.0)
MCV: 88.3 fL (ref 80.0–100.0)
Platelets: 285 K/uL (ref 150–400)
RBC: 3.49 MIL/uL — ABNORMAL LOW (ref 3.87–5.11)
RDW: 16.4 % — ABNORMAL HIGH (ref 11.5–15.5)
WBC: 5.5 K/uL (ref 4.0–10.5)
nRBC: 0 % (ref 0.0–0.2)

## 2024-03-31 LAB — GLUCOSE, CAPILLARY
Glucose-Capillary: 309 mg/dL — ABNORMAL HIGH (ref 70–99)
Glucose-Capillary: 144 mg/dL — ABNORMAL HIGH (ref 70–99)
Glucose-Capillary: 224 mg/dL — ABNORMAL HIGH (ref 70–99)
Glucose-Capillary: 122 mg/dL — ABNORMAL HIGH (ref 70–99)
Glucose-Capillary: 312 mg/dL — ABNORMAL HIGH (ref 70–99)

## 2024-03-31 LAB — BASIC METABOLIC PANEL WITH GFR
Anion gap: 11 (ref 5–15)
BUN: 55 mg/dL — ABNORMAL HIGH (ref 8–23)
CO2: 21 mmol/L — ABNORMAL LOW (ref 22–32)
Calcium: 8.6 mg/dL — ABNORMAL LOW (ref 8.9–10.3)
Chloride: 106 mmol/L (ref 98–111)
Creatinine, Ser: 1.55 mg/dL — ABNORMAL HIGH (ref 0.44–1.00)
GFR, Estimated: 35 mL/min — ABNORMAL LOW (ref >60)
Glucose, Bld: 144 mg/dL — ABNORMAL HIGH (ref 70–99)
Potassium: 4.2 mmol/L (ref 3.5–5.1)
Sodium: 138 mmol/L (ref 135–145)

## 2024-03-31 NOTE — Progress Notes (Signed)
 PROGRESS NOTE   Subjective/Complaints:  No acute events noted overnight.Asking about discharge date  ROS: as per HPI. Denies Chills,CP, SOB, abd pain, N/V/D/C, or any other complaints at this time.     Objective:   No results found. Recent Labs    03/30/24 0413 03/31/24 0644  WBC 5.3 5.5  HGB 10.3* 10.0*  HCT 31.7* 30.8*  PLT 289 285   Recent Labs    03/30/24 0413 03/31/24 0644  NA 138 138  K 4.5 4.2  CL 107 106  CO2 24 21*  GLUCOSE 92 144*  BUN 46* 55*  CREATININE 1.47* 1.55*  CALCIUM  8.6* 8.6*         Intake/Output Summary (Last 24 hours) at 03/31/2024 0848 Last data filed at 03/31/2024 0748 Gross per 24 hour  Intake 250 ml  Output --  Net 250 ml        Physical Exam: Vital Signs Blood pressure 132/74, pulse 92, temperature 97.8 F (36.6 C), temperature source Oral, resp. rate 18, height 5' 3 (1.6 m), weight 79.3 kg, SpO2 97%.  General: No acute distress,  lying in bed appears to be overall Mood and affect are appropriate Heart: Irregularly irregular no rubs murmurs or extra sounds Lungs: Clear to auscultation, breathing unlabored, no rales or wheezes Abdomen: Positive bowel sounds, soft nontender to palpation, nondistended Extremities: No clubbing, cyanosis, or edema Skin: No evidence of breakdown, no evidence of rash Neuro: Awake and alert,  Left neglect, cranial nerves II through XII grossly intact  PRIOR EXAMS: Neurologic:Oriented to person and place not time , Severe left neglect and left field cut Cranial nerves II through XII intact, motor strength is 5/5 in RIght deltoid, bicep, tricep, grip, hip flexor, knee extensors, ankle dorsiflexor and plantar flexor LUE 3-/5 proximal to distal , LLE 4-/5 Proximal , 3- at ankle  Sensory exam reduced LUE  Cerebellar limited on left due to weakness Musculoskeletal: Full range of motion in all 4 extremities. No joint  swelling  Assessment/Plan: 1. Functional deficits which require 3+ hours per day of interdisciplinary therapy in a comprehensive inpatient rehab setting. Physiatrist is providing close team supervision and 24 hour management of active medical problems listed below. Physiatrist and rehab team continue to assess barriers to discharge/monitor patient progress toward functional and medical goals  Care Tool:  Bathing    Body parts bathed by patient: Right arm, Left arm, Chest, Abdomen, Face   Body parts bathed by helper: Front perineal area, Buttocks, Right upper leg, Left upper leg, Right lower leg, Left lower leg     Bathing assist Assist Level: Maximal Assistance - Patient 24 - 49%     Upper Body Dressing/Undressing Upper body dressing   What is the patient wearing?: Pull over shirt    Upper body assist Assist Level: Maximal Assistance - Patient 25 - 49%    Lower Body Dressing/Undressing Lower body dressing      What is the patient wearing?: Incontinence brief, Pants     Lower body assist Assist for lower body dressing: Dependent - Patient 0%     Toileting Toileting    Toileting assist Assist for toileting: Dependent - Patient 0%  Transfers Chair/bed transfer  Transfers assist     Chair/bed transfer assist level: Moderate Assistance - Patient 50 - 74%     Locomotion Ambulation   Ambulation assist      Assist level: 2 helpers Assistive device: Parallel bars Max distance: 6   Walk 10 feet activity   Assist  Walk 10 feet activity did not occur: Safety/medical concerns        Walk 50 feet activity   Assist Walk 50 feet with 2 turns activity did not occur: Safety/medical concerns         Walk 150 feet activity   Assist Walk 150 feet activity did not occur: Safety/medical concerns         Walk 10 feet on uneven surface  activity   Assist Walk 10 feet on uneven surfaces activity did not occur: Safety/medical concerns          Wheelchair     Assist Is the patient using a wheelchair?: Yes Type of Wheelchair: Manual    Wheelchair assist level: Minimal Assistance - Patient > 75% Max wheelchair distance: 150    Wheelchair 50 feet with 2 turns activity    Assist        Assist Level: Minimal Assistance - Patient > 75%   Wheelchair 150 feet activity     Assist      Assist Level: Minimal Assistance - Patient > 75%   Blood pressure 132/74, pulse 92, temperature 97.8 F (36.6 C), temperature source Oral, resp. rate 18, height 5' 3 (1.6 m), weight 79.3 kg, SpO2 97%.  Medical Problem List and Plan: 1. Functional deficits secondary to Right MCA infarct             -patient may  shower             -ELOS/Goals: 12-16d  -Continue CIR  -Patient has about discharge date,Discussed plans for team conference on Wednesday  2.  Antithrombotics: -DVT/anticoagulation:  Pharmaceutical: Eliquis  5mg  BID             -antiplatelet therapy: ASA 81mg  daily  3. Pain Management: tylenol  prn.   4. Mood/Behavior/Sleep: LCSW to follow for evaluation and support.              -antipsychotic agents: N/A             -melatonin prn for insomnia  -Seroquel  12.5mg  nightly  5. Neuropsych/cognition: This patient is not capable of making decisions on her own behalf.  6. Skin/Wound Care: Routine pressure relief measures  7. Fluids/Electrolytes/Nutrition: routine I&O and labs             --on supplements due to low protein stores  -GERD: pepcid  20mg  daily   8. T2DM: Monitor BS ac/hs. Insulin  glargine decreased to 6 units daily on 11/22 --SSI for elevated BS. BS were likely elevated due to boost between meals-->change to ensure Max.             --titrate insulin  as needed.   -03/30/24 CBGs variable but perhaps improving, monitor with new dose of Lantus .  -11/24 Intermittently elevated, Continue current regimen and monitor trend For now I do not want to cause hypoglycemia  CBG (last 3)  Recent Labs     03/30/24 1718 03/30/24 2125 03/31/24 0604  GLUCAP 240* 169* 144*    9. Thyrotoxicosis: Was started on methimazole  10mg  daily  10. Acute on chronic renal failure: Encourage fluid intake.  --Recheck in am as BUN/K trending up -03/30/24 Cr 1.47, slightly higher  than 11/19-20 but generally similar to prior, seems this is likely close to her baseline (1.2-1.6); K+ 4.5, improving. Monitor routinely.  11/2 BUN and creatinine a lot higher, Encourage oral fluids and recheck tomorrow  11. Hyponatremia: Resolving 140 @ admission to drop to 128-->134 -11/24 stable 138  12. Afib/HTN: Monitor BP TID. Continue Metoprolol  25mg  bid   -11/24 BP stable continue to monitor  Vitals:   03/29/24 2030 03/30/24 0450 03/30/24 1006 03/30/24 2000  BP: 124/62 120/70 135/61 121/62   03/31/24 0349  BP: 132/74   13.  Sundowning vs visuoperceptual deficits due to stroke   14. Anemia: Hgb 11.0>>10.3, monitor for bleeding and check routine labs  - 11/24 stool occult blood a little bit lower at 10.0 hemoglobin  15. HLD: continue lipitor 40mg  daily, zetia  10mg  daily  16. Constipation: continue senna 17.2mg  nightly  -03/30/24 LBM yesterday, monitor    LOS: 2 days A FACE TO FACE EVALUATION WAS PERFORMED  Murray Collier 03/31/2024, 8:48 AM

## 2024-03-31 NOTE — Progress Notes (Signed)
 Inpatient Rehabilitation  Patient information reviewed and entered into eRehab system by [facility]. Karen Kays., CCC/SLP, PPS Coordinator.  Information including medical coding, functional ability and quality indicators will be reviewed and updated through discharge.

## 2024-03-31 NOTE — Progress Notes (Signed)
 [facility] Individual Statement of Services  Patient Name:  Megan Fischer  Date:  03/31/2024  Welcome to the [facility].  Our goal is to provide you with an individualized program based on your diagnosis and situation, designed to meet your specific needs.  With this comprehensive rehabilitation program, you will be expected to participate in at least 3 hours of rehabilitation therapies Monday-Friday, with modified therapy programming on the weekends.  Your rehabilitation program will include the following services:  Physical Therapy (PT), Occupational Therapy (OT), Speech Therapy (ST), 24 hour per day rehabilitation nursing, Therapeutic Recreaction (TR), Neuropsychology, Care Coordinator, Rehabilitation Medicine, Nutrition Services, and Pharmacy Services  Weekly team conferences will be held on Wednesday to discuss your progress.  Your Inpatient Rehabilitation Care Coordinator will talk with you frequently to get your input and to update you on team discussions.  Team conferences with you and your family in attendance may also be held.  Expected length of stay: 12-14 days  Overall anticipated outcome: min level-overall  Depending on your progress and recovery, your program may change. Your Inpatient Rehabilitation Care Coordinator will coordinate services and will keep you informed of any changes. Your Inpatient Rehabilitation Care Coordinator's name and contact numbers are listed  below.  The following services may also be recommended but are not provided by the [facility]:   Home Health Rehabiltiation Services Outpatient Rehabilitation Services    Arrangements will be made to provide these services after discharge if needed.  Arrangements include referral to agencies that provide these services.  Your insurance has been verified to be:  [employer] Dual Complete Your primary doctor is:  Maryann Azadegan  Pertinent information will  be shared with your doctor and your insurance company.  Inpatient Rehabilitation Care Coordinator:  Rhoda Clement, KEN [phone] or ELIGAH BASQUES  Information discussed with and copy given to patient by: Clement Asberry MATSU, 03/31/2024, 1:36 PM

## 2024-03-31 NOTE — Progress Notes (Signed)
 Physical Therapy Session Note  Patient Details  Name: Megan Fischer MRN: [id number] Date of Birth: 04-09-1951  Today's Date: 03/31/2024 PT Missed Time: 75 Minutes Missed Time Reason: Patient fatigue;Patient unwilling to participate  Short Term Goals: Week 1:  PT Short Term Goal 1 (Week 1): Pt will perform STS with RW PT Short Term Goal 2 (Week 1): Pt will initiate bed/chair transfer with LRAD PT Short Term Goal 3 (Week 1): Pt will initiate gait with RW  Skilled Therapeutic Interventions/Progress Updates:     Pt refuses therapy at this time due to not feeling like it. PT encourages pt to participate and pt continues to refuse. PT will follow up as able.   Therapy Documentation Precautions:  Precautions Precautions: Fall, Other (comment) Precaution/Restrictions Comments: L homonymous hemianopsia, watch HR Restrictions Weight Bearing Restrictions Per Provider Order: No General: PT Amount of Missed Time (min): 75 Minutes PT Missed Treatment Reason: Patient fatigue;Patient unwilling to participate   Therapy/Group: Individual Therapy  [name] JAYSON Dawn 03/31/2024, 2:30 PM

## 2024-03-31 NOTE — Progress Notes (Signed)
 Occupational Therapy Session Note  Patient Details  Name: Megan Fischer MRN: [id number] Date of Birth: 10-Nov-1950  Today's Date: 03/31/2024 OT Individual Time: 9046-8953 OT Individual Time Calculation (min): 53 min    Short Term Goals: Week 1:  OT Short Term Goal 1 (Week 1): Pt will complete UB bathing and dressing with Mod A OT Short Term Goal 2 (Week 1): Pt will complete LB bathing and dressing with Mod A OT Short Term Goal 3 (Week 1): Pt will complete 2/3 toileting steps with VC to utilize LUE.  Skilled Therapeutic Interventions/Progress Updates:    Pt received supine with NT Rock helping with peri care. She came to EOB with (S), extra time. She required mod A to don a clean shirt, d/t poor motor planning and L inattention. She donned new pants with max A. L inattention and gaze preference at times, but able to scan L spontaneously. She completed sit > stand with min A from EOB using RW. She was able to stand statically for 1 min with CGA. She completed a stand pivot to the w/c with min A using RW. Pt required use of rest breaks throughout session for recovery, as well as to support safety and prevent overexertion. During breaks, OT monitored recovery time to assess endurance and response to exertion. She was brought via w/c to the therapy gym. Attempted to stand with the RW but was unable with max A x3 attempts. She blames lack of shoes- none found in room. She was able to power up initially but unable to translate weight forward. She completed BITS BUE functional reaching activity to challenge B shoulder flexion and motor planning, x2 min, 14 repetitions. Often requiring self HOH. She returned to her room, complaining of fatigue and wanting to get back to bed. Max A sit > stand from w/c with mod cueing for body mechanics. She completed 5 steps to the bed with min A using the RW. Mod A to get supine. She was left supine with all needs met, bed alarm set.    Therapy  Documentation Precautions:  Precautions Precautions: Fall, Other (comment) Precaution/Restrictions Comments: L homonymous hemianopsia, watch HR Restrictions Weight Bearing Restrictions Per Provider Order: No Therapy/Group: Individual Therapy  Nena VEAR Moats 03/31/2024, 10:09 AM

## 2024-03-31 NOTE — Plan of Care (Signed)
  Problem: Consults Goal: RH STROKE PATIENT EDUCATION Description: See Patient Education module for education specifics  Outcome: Progressing Goal: Nutrition Consult-if indicated Outcome: Progressing Goal: Diabetes Guidelines if Diabetic/Glucose > 140 Description: If diabetic or lab glucose is > 140 mg/dl - Initiate Diabetes/Hyperglycemia Guidelines & Document Interventions  Outcome: Progressing   Problem: RH BOWEL ELIMINATION Goal: RH STG MANAGE BOWEL WITH ASSISTANCE Description: STG Manage Bowel with mod I Assistance. Outcome: Progressing Goal: RH STG MANAGE BOWEL W/MEDICATION W/ASSISTANCE Description: STG Manage Bowel with Medication with mod I Assistance. Outcome: Progressing   Problem: RH BLADDER ELIMINATION Goal: RH STG MANAGE BLADDER WITH ASSISTANCE Description: STG Manage Bladder With mod I Assistance Outcome: Progressing   Problem: RH SAFETY Goal: RH STG ADHERE TO SAFETY PRECAUTIONS W/ASSISTANCE/DEVICE Description: STG Adhere to Safety Precautions With cues Assistance/Device. Outcome: Progressing Goal: RH STG DECREASED RISK OF FALL WITH ASSISTANCE Description: STG Decreased Risk of Fall With cues Assistance. Outcome: Progressing   Problem: RH KNOWLEDGE DEFICIT Goal: RH STG INCREASE KNOWLEDGE OF DIABETES Description: Patient and son will be able to manage DM using educational resources for medications and dietary modification independently Outcome: Progressing Goal: RH STG INCREASE KNOWLEDGE OF HYPERTENSION Description: Patient and son will be able to manage HTN using educational resources for medications and dietary modification independently Outcome: Progressing Goal: RH STG INCREASE KNOWLEGDE OF HYPERLIPIDEMIA Description: Patient and son will be able to manage HLD using educational resources for medications and dietary modification independently Outcome: Progressing Goal: RH STG INCREASE KNOWLEDGE OF STROKE PROPHYLAXIS Description: Patient and son will be able to  manage secondary risks using educational resources for medications and dietary modification independently Outcome: Progressing

## 2024-03-31 NOTE — Progress Notes (Signed)
 Patient ID: Megan Fischer, female   DOB: September 01, 1950, 73 y.o.   MRN: [id number] Met with the patient to review current medical situation, rehab process, team conference and plan of care post recurrent CVA with RLLPE. Currently on Eliquis  with ASA. Reviewed medications; reported has meds in a drawer and son assisted with administration of insulin  PTA. Discussed CMM/HH dietary modification recommendations; son has DM and does meal prep. Continue to follow along to address educational needs to facilitate preparation for discharge. Megan Fischer

## 2024-03-31 NOTE — Progress Notes (Signed)
 Speech Language Pathology Daily Session Note  Patient Details  Name: Megan Fischer MRN: [id number] Date of Birth: 1950/06/19  Today's Date: 03/31/2024 SLP Individual Time: 1100-1200 SLP Individual Time Calculation (min): 60 min  Short Term Goals: Week 1: SLP Short Term Goal 1 (Week 1): Pt will recall functional daily information with 80% acc using compensatory memory strategies when provided mod A. SLP Short Term Goal 2 (Week 1): Pt will complete generative naming tasks using circumlocution and mod A. SLP Short Term Goal 3 (Week 1): Pt will complete functional problem solving when provided mod-max A.  Skilled Therapeutic Interventions: SLP conducted skilled therapy session targeting cognitive goals. SLP facilitated a mildly complex card sorting task where pt organized cards into even, odd, and letter categories. Patient minA to verbally state the category each card belongs however modA to physically place cards in appropriate category impacted by L inattention, impulsivity, and lack of self-correcting. Note patient benefited from increased processing time to locate items in L visual field. Clinician attempted to initiate another task for the last of session however pt became frustrated with either SLP or task. SLP asked pt source of frustration but patient remained silent. Despite clinician cues for patient to engage in functional communication, patient voiced leave me alone and I don't know for basic/familiar questions regarding favorite food and tv show. Patient was left in room with call bell in reach and alarm set. SLP will continue to target goals per plan of care.        Pain   None endorsed  Therapy/Group: Individual Therapy  Richele Strand Kwakye-Ndlovu 03/31/2024, 12:17 PM

## 2024-03-31 NOTE — Progress Notes (Signed)
 Speech Language Pathology Daily Session Note  Patient Details  Name: Megan Fischer MRN: [id number] Date of Birth: [birth year]  Today's Date: 03/31/2024 SLP Individual Time: 1300-1400 SLP Individual Time Calculation (min): 60 min  Short Term Goals: Week 1: SLP Short Term Goal 1 (Week 1): Pt will recall functional daily information with 80% acc using compensatory memory strategies when provided mod A. SLP Short Term Goal 2 (Week 1): Pt will complete generative naming tasks using circumlocution and mod A. SLP Short Term Goal 3 (Week 1): Pt will complete functional problem solving when provided mod-max A.  Skilled Therapeutic Interventions: Skilled therapy session focused on cognitive goals. SLP targeted problem solving and attention goals through word search activity. Patient reports frustration in prior tx and feeling like she can do this at home. Patient reports enjoying completing word searches and was agreeable to task. SLP prompted patient to locate words on the L side of the page, however patient with perseveration on figuring out which side she wants to do and with increasing frustration towards therapist. Patient eventually agreed to locate single word on page. SLP branched down to prompting patient to locate date on calendar. Patient aware that this week is Thanksgiving, however required total A to locate correct date. 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 At the end of the session, patient recalled activites completed in prior sessions and information about family/medical hx. Patient left in bed with alarm set and call bell in reach. Continue  Pain None reported   Therapy/Group: Individual Therapy  Daelon Dunivan M.A., CCC-SLP 03/31/2024, 7:43 AM

## 2024-03-31 NOTE — Progress Notes (Signed)
 Inpatient Rehabilitation Care Coordinator Assessment and Plan Patient Details  Name: Megan Fischer MRN: [id number] Date of Birth: May 28, 1950  Today's Date: 03/31/2024  Hospital Problems: Principal Problem:   Acute ischemic right MCA stroke [facility])  Past Medical History:  Past Medical History:  Diagnosis Date   Atrial fibrillation (HCC)    Chronic disease anemia 03/29/2011   Diabetes mellitus    Dyslipidemia    Hypertension    Obesities, morbid (HCC)    Osteoarthritis    Past Surgical History:  Past Surgical History:  Procedure Laterality Date   CHOLECYSTECTOMY     REPLACEMENT TOTAL KNEE BILATERAL     SHOULDER SURGERY     RIGHT SHOULDER   TUBAL LIGATION     Social History:  reports that she has never smoked. She has never used smokeless tobacco. She reports that she does not drink alcohol and does not use drugs.  Family / Support Systems Marital Status: Single Patient Roles: Parent, Other (Comment) (retiree) Children: Elizah-son [phone]  Natasha-daughter [phone] Other Supports: Hildy [phone] Anticipated Caregiver: Son, DIL and daughter Ability/Limitations of Caregiver: None Caregiver Availability: 24/7 Family Dynamics: The family has been providing 24/7 care to pt since CIR discharge 02/15/2024. They have a CAP referral in place but there is a waiting list for this program. Will make PCS referral while here  Social History Preferred language: English Religion: Holiness Cultural Background: NA Education: HS Health Literacy - How often do you need to have someone help you when you read instructions, pamphlets, or other written material from your doctor or pharmacy?: Never Writes: Yes Employment Status: Retired Marine Scientist Issues: None Guardian/Conservator: None according to MD pt is not fully capable of making her decisions while here. Will look toward her children for any decisions while here due to no formal POA/HCPOA in place    Abuse/Neglect Abuse/Neglect Assessment Can Be Completed: Yes Physical Abuse: Denies Verbal Abuse: Denies Sexual Abuse: Denies Exploitation of patient/patient's resources: Denies Self-Neglect: Denies  Patient response to: Social Isolation - How often do you feel lonely or isolated from those around you?: Never  Emotional Status Pt's affect, behavior and adjustment status: Pt can explain she had another CVA-she was home with children providing care prior to admission. She is here again to recover from this new stroke. She did require care prior to admission and will need again. Recent Psychosocial Issues: other health issues-recent CVA in 01/2024 Psychiatric History: No issues but is here for another stroke and will need to try to recover from this stroke. She may benefit from seeing neuro-psych while here Substance Abuse History: NA  Patient / Family Perceptions, Expectations & Goals Pt/Family understanding of illness & functional limitations: Pt and son can explain her new stroke and issues, both are hopeful she will do well and recover from this stroke. She did require care after last admission and will also again. Both talk with the MD regarding plan moving forward. Premorbid pt/family roles/activities: mom, retiree, friend, etc Anticipated changes in roles/activities/participation: resume Pt/family expectations/goals: Pt states:  I'm back I had another stroke.  Community Resources [employer]: Other (Comment) ([facility] for PT OT SP) Premorbid Home Care/DME Agencies: Other (Comment) (hospital bed, wc, tub seat, toliet riser, drop-arm bsc, grab bars) Transportation available at discharge: family Is the patient able to respond to transportation needs?: Yes In the past 12 months, has lack of transportation kept you from medical appointments or from getting medications?: No In the past 12 months, has lack of transportation kept you  from meetings, work, or from getting things  needed for daily living?: No Resource referrals recommended: Neuropsychology  Discharge Planning Living Arrangements: Children, Other relatives Support Systems: Children, Other relatives, Friends/neighbors Type of Residence: Private residence Insurance Resources: [employer] (specify) ([employer] Medicare dual complete) Financial Resources: [employer], Family Support Financial Screen Referred: No Living Expenses: Lives with family Money Management: Family Does the patient have any problems obtaining your medications?: No Home Management: family Patient/Family Preliminary Plans: Return to son's home where she was staying and they were providing 24/7 care due to last CVA. Aware of CIR process since was just here last month for rehab for first CVA. Will update once team conference and target discharge date set. Care Coordinator Barriers to Discharge: Insurance for SNF coverage Care Coordinator Anticipated Follow Up Needs: HH/OP  Clinical Impression Pleasant female who is familiar to CIR due to was here last month. Son, daughter and daughter in-law involved and have been providing care to pt. Will work on discharge needs, already on CAP waiting list and will make PCS referral while here. Work on additional discharge needs.   Raymonde Asberry MATSU 03/31/2024, 1:21 PM

## 2024-04-01 ENCOUNTER — Encounter: Payer: Self-pay | Admitting: Hematology

## 2024-04-01 ENCOUNTER — Other Ambulatory Visit (HOSPITAL_COMMUNITY): Payer: Self-pay

## 2024-04-01 DIAGNOSIS — G8114 Spastic hemiplegia affecting left nondominant side: Secondary | ICD-10-CM | POA: Diagnosis not present

## 2024-04-01 DIAGNOSIS — I639 Cerebral infarction, unspecified: Secondary | ICD-10-CM | POA: Diagnosis not present

## 2024-04-01 DIAGNOSIS — I63511 Cerebral infarction due to unspecified occlusion or stenosis of right middle cerebral artery: Secondary | ICD-10-CM | POA: Diagnosis not present

## 2024-04-01 LAB — BASIC METABOLIC PANEL WITH GFR
Anion gap: 10 (ref 5–15)
BUN: 55 mg/dL — ABNORMAL HIGH (ref 8–23)
CO2: 21 mmol/L — ABNORMAL LOW (ref 22–32)
Calcium: 8.6 mg/dL — ABNORMAL LOW (ref 8.9–10.3)
Chloride: 108 mmol/L (ref 98–111)
Creatinine, Ser: 1.69 mg/dL — ABNORMAL HIGH (ref 0.44–1.00)
GFR, Estimated: 32 mL/min — ABNORMAL LOW (ref >60)
Glucose, Bld: 88 mg/dL (ref 70–99)
Potassium: 4.4 mmol/L (ref 3.5–5.1)
Sodium: 139 mmol/L (ref 135–145)

## 2024-04-01 LAB — GLUCOSE, CAPILLARY
Glucose-Capillary: 88 mg/dL (ref 70–99)
Glucose-Capillary: 239 mg/dL — ABNORMAL HIGH (ref 70–99)
Glucose-Capillary: 132 mg/dL — ABNORMAL HIGH (ref 70–99)

## 2024-04-01 MED ORDER — METHIMAZOLE 5 MG PO TABS
5.0000 mg | ORAL_TABLET | Freq: Every day | ORAL | 0 refills | Status: DC
Start: 2024-04-01 — End: 2024-04-01
  Filled 2024-04-01: qty 30, 30d supply, fill #0

## 2024-04-01 MED ORDER — MELATONIN 5 MG PO TABS: 5.0000 mg | ORAL_TABLET | Freq: Every evening | ORAL | Status: AC | PRN

## 2024-04-01 MED ORDER — INSULIN PEN NEEDLE 32G X 4 MM MISC
1.0000 | Freq: Every day | 0 refills | Status: DC
  Filled 2024-04-01: qty 100, 30d supply, fill #0

## 2024-04-01 MED ORDER — SENNA 8.6 MG PO TABS
2.0000 | ORAL_TABLET | Freq: Every day | ORAL | 0 refills | Status: AC
  Filled 2024-04-01: qty 120, 60d supply, fill #0

## 2024-04-01 MED ORDER — INSULIN GLARGINE 100 UNIT/ML SOLOSTAR PEN
6.0000 [IU] | PEN_INJECTOR | Freq: Every day | SUBCUTANEOUS | 11 refills | Status: DC
  Filled 2024-04-01: qty 3, 28d supply, fill #0

## 2024-04-01 MED ORDER — METOPROLOL TARTRATE 25 MG PO TABS
25.0000 mg | ORAL_TABLET | Freq: Two times a day (BID) | ORAL | 0 refills | Status: DC
  Filled 2024-04-01: qty 60, 30d supply, fill #0

## 2024-04-01 MED ORDER — METHIMAZOLE 10 MG PO TABS
10.0000 mg | ORAL_TABLET | Freq: Every day | ORAL | 0 refills | Status: DC
  Filled 2024-04-01: qty 30, 30d supply, fill #0

## 2024-04-01 NOTE — Progress Notes (Signed)
 Occupational Therapy Discharge Summary  Patient Details  Name: Megan Fischer MRN: [id number] Date of Birth: February 17, 1951  Date of Discharge from OT service:April 01, 2024  Today's Date: 04/01/2024 OT Individual Time: 9053-8967 OT Individual Time Calculation (min): 46 min    Patient has met 1 of 7 long term goals due to improved balance, improved attention, and improved coordination.  Patient to discharge at [facility] Max Assist level.  Patient's care partner is independent to provide the necessary physical and cognitive assistance at discharge.    Reasons goals not met: Goals not met d/t Pt requesting to go home sooner than anticipated on eval. Pt's DTR in-law and son are able to provide the necessary level of assistance and feel comfortable taking her home today. See POC note for further details.   Recommendation:  Patient will benefit from ongoing skilled OT services in home health setting to continue to advance functional skills in the area of BADL, iADL, and Reduce care partner burden.  Equipment: No equipment provided. Pt is using a loaner custom chair, BSC, RW, and are planning to buy a STEDY to use when Pt feels too weak to stand.   Reasons for discharge: discharge from hospital and Pt request to d/c home sooner than recommended by evaluating therapy team.  Patient/family agrees with progress made and goals achieved: Yes  OT Discharge Skilled Therapeutic Intervention/Progress Updates:  Pt received deeply sleeping in bed with DTR-in-law present in room. Pt waking upon OT arrival,  receptive to skilled OT session reporting 0/10 pain- OT offering intermittent rest breaks, repositioning, and therapeutic support to optimize participation in therapy session. Pt participated in completing grad day activities of sensory, vision, cognition, motor, mobility, strength, and balance assessments. Spent large portion of time during session discussing d/c plans and providing recommendations for Pt  to complete sponge bath at d/c to ensure safety, to utilize STEDY when Pt feels too fatigued to stand with RW, and follow-up therapy recommendations of HHOT services. Pt eager to return home and Pt's DTR in law reporting they feel confident in taking care of her at her CLOF. No DME needs reported. Pt receptive to completing ADLs in room donning OH shirt with MAX A required d/t L inattention, difficulty orienting shirt, and motor planning deficit. Donned brief/pants bed level with Pt able roll R<>L MOD I and bridge hips to bring pants to waist with SUP requiring overall MAX A this session for LB dressing. Pt declining all other therapeutic activities this session. Pt and caregiver reporting all questions were answered. Pt was left resting in bed with call bell in reach, bed alarm on, and all needs met.   Precautions/Restrictions  Precautions Precautions: Fall;Other (comment) Recall of Precautions/Restrictions: Impaired Precaution/Restrictions Comments: L homonymous hemianopsia, watch HR Restrictions Weight Bearing Restrictions Per Provider Order: No Pain Pain Assessment Pain Scale: 0-10 Pain Score: 0-No pain ADL ADL Eating: Set up, Independent Where Assessed-Eating: Bed level Grooming: Minimal assistance Where Assessed-Grooming: Bed level Upper Body Bathing: Maximal assistance Where Assessed-Upper Body Bathing: Bed level Lower Body Bathing: Maximal assistance Where Assessed-Lower Body Bathing: Bed level Upper Body Dressing: Maximal assistance Where Assessed-Upper Body Dressing: Edge of bed Lower Body Dressing: Dependent, Maximal assistance Where Assessed-Lower Body Dressing: Bed level Toileting: Maximal assistance, Dependent Where Assessed-Toileting: Bedside Commode Toilet Transfer: Moderate assistance Toilet Transfer Method: Stand pivot Toilet Transfer Equipment: Grab bars, Bedside commode Tub/Shower Transfer: Not assessed Film/video Editor: Not assessed Vision Baseline  Vision/History: 1 Wears glasses Patient Visual Report: No change from baseline  Visual Fields: Left homonymous hemianopsia Additional Comments: Baseline L side visual deficits, Unknown if it's a true visual cut. Pt reports that her left eye vision is normally blurry. Perception  Perception: Impaired Perception-Other Comments: L neglect Praxis Praxis: Impaired Praxis Impairment Details: Motor planning Cognition Cognition Overall Cognitive Status: Impaired/Different from baseline Arousal/Alertness: Awake/alert Orientation Level: Person;Place;Situation Person: Oriented Place: Oriented Situation: Oriented Memory: Impaired Memory Impairment: Decreased short term memory;Retrieval deficit;Decreased recall of new information;Storage deficit Decreased Short Term Memory: Verbal basic;Functional basic Attention: Sustained Sustained Attention: Impaired Sustained Attention Impairment: Verbal basic;Functional basic Awareness: Impaired Awareness Impairment: Intellectual impairment Problem Solving: Impaired Problem Solving Impairment: Verbal basic;Functional basic Executive Function: Organizing;Self Monitoring Organizing: Impaired Organizing Impairment: Functional basic;Functional complex Self Monitoring: Impaired Behaviors: Impulsive;Restless;Perseveration Safety/Judgment: Impaired Brief Interview for Mental Status (BIMS) Repetition of Three Words (First Attempt): 3 Temporal Orientation: Year: Correct Temporal Orientation: Month: Accurate within 5 days Temporal Orientation: Day: Correct Recall: Sock: No, could not recall Recall: Blue: Yes, after cueing (a color) Recall: Bed: Yes, no cue required BIMS Summary Score: 12 Sensation Sensation Light Touch: Impaired by gross assessment Hot/Cold: Appears Intact Proprioception: Impaired by gross assessment Stereognosis: Not tested Additional Comments: Pt with decreased sensation in L thigh and knee, but reports sensation the same BIL  below knee Coordination Gross Motor Movements are Fluid and Coordinated: No Fine Motor Movements are Fluid and Coordinated: No Coordination and Movement Description: limited by L neglect, B LE weakness, and decreased GMC Finger Nose Finger Test: impaired Motor  Motor Motor: Hemiplegia;Motor apraxia Motor - Discharge Observations: L hemi and apraxia Mobility  Bed Mobility Bed Mobility: Rolling Right;Rolling Left;Supine to Sit;Sit to Supine Rolling Right: Independent with assistive device Rolling Left: Independent with assistive device Supine to Sit: Minimal Assistance - Patient > 75% Sit to Supine: Moderate Assistance - Patient 50-74% Transfers Sit to Stand: Moderate Assistance - Patient 50-74%  Trunk/Postural Assessment  Cervical Assessment Cervical Assessment: Within Functional Limits Thoracic Assessment Thoracic Assessment: Exceptions to [facility] (rounded shoulders) Lumbar Assessment Lumbar Assessment: Exceptions to [facility] (posterior pelvic tilt) Postural Control Postural Control: Deficits on evaluation Trunk Control: decreased  Balance Balance Balance Assessed: Yes Static Sitting Balance Static Sitting - Balance Support: No upper extremity supported;Feet supported Static Sitting - Level of Assistance: 5: Stand by assistance Static Standing Balance Static Standing - Balance Support: Bilateral upper extremity supported;During functional activity Static Standing - Level of Assistance: 4: Min assist Extremity/Trunk Assessment RUE Assessment RUE Assessment: Exceptions to [facility] Active Range of Motion (AROM) Comments: Impaired shoulder mobility d/t chronic arthritis. full A/ROM elbow, wrist, hand General Strength Comments: shoulder flexion/abduction 3-/5, 3/5 IR/er, 4/5 elbow flexion, functional gross grasp LUE Assessment LUE Assessment: Exceptions to [facility] Active Range of Motion (AROM) Comments: Impaired shoulder mobility d/t baseline L hemi. full A/ROM elbow, wrist, hand General  Strength Comments: shoulder flexion/abduction 3-/5, 3/5 IR/er, 4/5 elbow flexion, functional gross grasp   Katheryn SHAUNNA [place] 04/01/2024, 10:23 AM

## 2024-04-01 NOTE — Progress Notes (Signed)
 Physical Therapy Session Note  Patient Details  Name: Megan Fischer MRN: [id number] Date of Birth: 07-28-1950    Short Term Goals: Week 1:  PT Short Term Goal 1 (Week 1): Pt will perform STS with RW PT Short Term Goal 2 (Week 1): Pt will initiate bed/chair transfer with LRAD PT Short Term Goal 3 (Week 1): Pt will initiate gait with RW  Skilled Therapeutic Interventions/Progress Updates:      Attempted to make up missed minutes with pt however pt daugther in law present and endorses family's plan to discharge her today 2/2 pt not wanting to be here. PT notified Primary team.   Therapy Documentation Precautions:  Precautions Precautions: Fall, Other (comment) Precaution/Restrictions Comments: L homonymous hemianopsia, watch HR Restrictions Weight Bearing Restrictions Per Provider Order: No  Therapy/Group: Individual Therapy  [facility], [place], DPT  04/01/2024, 9:02 AM

## 2024-04-01 NOTE — Progress Notes (Signed)
 Patient ID: Megan Fischer, female   DOB: 1950-08-23, 73 y.o.   MRN: [id number] Pt and family requesting to go home today. MD on board with this and pt has refused therapy sessions also. Still active with [facility] and have sent resumption of services and added speech. Has all DME and will make PCS referral due to on CAP waiting list which is long. Pam-PA aware and team also

## 2024-04-01 NOTE — Progress Notes (Signed)
 Inpatient Rehabilitation Care Coordinator Discharge Note   Patient Details  Name: Megan Fischer MRN: [id number] Date of Birth: June 20, 1950   Discharge location: HOME WITH SON AND DAUGHTER IN-LAW WHO ARE AWAR EOF THE 24/7 CARE NEEDS  Length of Stay: 3 DAYS  Discharge activity level: MIN-MOD LEVEL  Home/community participation: HOMEBOUND  Patient response un:Yzjouy Literacy - How often do you need to have someone help you when you read instructions, pamphlets, or other written material from your doctor or pharmacy?: Sometimes  Patient response un:Dnrpjo Isolation - How often do you feel lonely or isolated from those around you?: Patient declines to respond  Services provided included: MD, RD, PT, OT, SLP, RN, CM, Pharmacy, SW  Financial Services:  Field Seismologist Utilized: [employer] [facility] MEDICARE DUAL COMPLETE  Choices offered to/list presented to: DIL  Follow-up services arranged:  Home Health, Patient/Family request agency HH/DME Home Health Agency: [facility] ACTIVE PT--PT  OT  SP      HH/DME Requested Agency: ACTIVE PT WITH [facility]  ON CAP WAITING LIST AWARE OF THIS. PCS REFERRAL MADE-FAMILY AWARE OF THIS  Patient response to transportation need: Is the patient able to respond to transportation needs?: Yes In the past 12 months, has lack of transportation kept you from medical appointments or from getting medications?: No In the past 12 months, has lack of transportation kept you from meetings, work, or from getting things needed for daily living?: No   Patient/Family verbalized understanding of follow-up arrangements:  Yes  Individual responsible for coordination of the follow-up plan: ELIZAH-SON [phone]  Confirmed correct DME delivered: Raymonde Asberry MATSU 04/01/2024    Comments (or additional information):PT AND FAMILY REQUESTING TO DISCHSRGRE HOME, NOT SURE REASON CAME HERE SINCE WAS HERE IN 02/2024 AND KNOWS THE PROCESS OF CIR. FAMILY AWARE  PT WILL NEED 24/7 PHYSICAL CARE AT HOME AS SHE WAS RECEIVING FROM FAMILY PRIOR TO ADMISSION    Jezreel Sisk G

## 2024-04-01 NOTE — Progress Notes (Addendum)
 Inpatient Rehabilitation Discharge Medication Review by a Pharmacist  A complete drug regimen review was completed for this patient to identify any potential clinically significant medication issues.   High Risk Drug Classes Is patient taking? Indication by Medication  Antipsychotic No    Anticoagulant Yes Apixaban  - PE and afib  Antibiotic No    Opioid No    Antiplatelet Yes Aspirin  - antiplatelet  Hypoglycemics/insulin  Yes Insulin  glargine, glimepiride  - DM2  Vasoactive Medication Yes Metoprolol  - HTN, Afib  Chemotherapy No    Other Yes Acetaminophen , diclofenac  topical gel, lidocaine  patch- pain management Ezetimibe , rosuvastatin  - HLD Famotidine  -reflux Ferrous sulfate  - iron supplement Methimazole  - hyperthyroid / Thyrotoxicosis  Melatonin -insomnia Multivitamin, calcium - supplement Sarna lotion- itching Senna, PEG - constipation        Type of Medication Issue Identified Description of Issue Recommendation(s)  Drug Interaction(s) (clinically significant)        Duplicate Therapy        Allergy        No Medication Administration End Date        Incorrect Dose        Additional Drug Therapy Needed        Significant med changes from prior encounter (inform family/care partners about these prior to discharge). Jardiance , Amlodipine , benazepril , celebrex , duloxetine ,  semaglutide  injection, gabapentin , and methocarbamol  were discontinued.   Metoprolol  succinate XL was changed to Metoprolol  tartrate. Communicate relevant medication changes to patient/family members at discharge from CIR.      Other            Clinically significant medication issues were identified that warrant physician communication and completion of prescribed/recommended actions by midnight of the next day:  No   Name of provider notified for urgent issues identified:    Provider Method of Notification:        Pharmacist comments:    Time spent performing this drug regimen review  (minutes):  20    Levorn Gaskins, RPh Clinical Pharmacist 04/01/2024 11:23 AM

## 2024-04-01 NOTE — Progress Notes (Signed)
 LA DL PICC removed per protocol per MD order. Manual pressure applied for 3 mins. Vaseline gauze, gauze, and Tegaderm applied over insertion site. No bleeding or swelling noted. Instructed patient to remain in bed for thirty mins. Educated patient and family about S/S of infection and when to call MD; no heavy lifting or pressure on left side for 24 hours; keep dressing dry and intact for 24 hours. Pt and family verbalized comprehension.

## 2024-04-01 NOTE — Progress Notes (Signed)
 Speech Language Pathology Discharge Summary  Patient Details  Name: Megan Fischer MRN: [id number] Date of Birth: February 26, 1951  Date of Discharge from SLP service:April 01, 2024   Patient has met  (0) of 3 long term goals.  Patient to discharge at overall Mod;Max level.  Reasons goals not met: patient d/c from program early per patient/family request - limited participation in therapy   Clinical Impression/Discharge Summary:  Pt has met 0 of 3 LTG's this admission due to patient/family choosing to leave program early and thus with limited completion/participation of tx. Pt is currently an overall mod-maxA for cognitive tasks will require 24 hour supervision from friends/family/etc. Pt would benefit from [facility] f/u ST services to maximize cognition in order to maximize functional independence.     Care Partner:  Caregiver Able to Provide Assistance: Yes  Type of Caregiver Assistance: Cognitive  Recommendation:  Home Health SLP  Rationale for SLP Follow Up: Maximize cognitive function and independence   Equipment: n/a   Reasons for discharge: Discharged from hospital   Patient/Family Agrees with Progress Made and Goals Achieved: Yes    Catilyn Boggus M.A., CCC-SLP 04/01/2024, 9:41 AM

## 2024-04-01 NOTE — Progress Notes (Signed)
 Physical Therapy Discharge Summary  Patient Details  Name: Megan Fischer MRN: [id number] Date of Birth: 29-Sep-1950  Date of Discharge from PT service:April 01, 2024  Today's Date: 04/01/2024   Patient has only met 1 of 6 long term goals due to her inability to participate in therapy sessions.  All inofrmation re: mobility have been filled based on mobility seen during sessions with other therapists as well as performance on evaluation date 2 days prior.   Patient to discharge at a wheelchair level Mod Assist overall.   Patient's care partner is independent to provide the necessary physical and cognitive assistance at discharge.  Reasons goals not met: Pt not participatory in therapy sessions since evaluation and has decided along with family to discharge home prior to completion of therapy program.   Recommendation:  Patient will benefit from ongoing skilled PT services in home health setting to continue to advance safe functional mobility, address ongoing impairments in strength, coordination, balance, activity tolerance, cognition, safety awareness, and to minimize fall risk.  Equipment: No equipment provided  Reasons for discharge: pt and family deciding to leave program early d/t pt's refusal to participate  Patient/family agrees with progress made and goals achieved: Yes  PT Discharge Precautions/Restrictions Precautions Precautions: Fall Precaution/Restrictions Comments: L homonymous hemianopsia, watch HR Restrictions Weight Bearing Restrictions Per Provider Order: No Vital Signs   Pain Pain Assessment Pain Scale: Faces Faces Pain Scale: No hurt Pain Interference Pain Interference Pain Effect on Sleep: 1. Rarely or not at all Pain Interference with Therapy Activities: 1. Rarely or not at all Pain Interference with Day-to-Day Activities: 1. Rarely or not at all Vision/Perception  Vision - History Ability to See in Adequate Light: 1  Impaired Perception Perception: Impaired Preception Impairment Details: Inattention/Neglect Perception-Other Comments: L neglect Praxis Praxis: Impaired Praxis Impairment Details: Motor planning  Cognition Overall Cognitive Status: No family/caregiver present to determine baseline cognitive functioning Arousal/Alertness: Awake/alert Orientation Level: Oriented X4 Comments: Unable to assess cognition as pt refused therapy and related desire to return home prior to completion of program. Family has initiated d/c home today. Sensation Sensation Light Touch: Impaired by gross assessment Additional Comments: Pt with decreased sensation in L thigh and knee, but reports sensation the same BIL below knee (gleaned from notes with previous therapists) Coordination Gross Motor Movements are Fluid and Coordinated: No Fine Motor Movements are Fluid and Coordinated: No Coordination and Movement Description: limited by L neglect, B LE weakness, and decreased GMC (gleaned from previous therapist's notes) Heel Shin Test: unable to test Motor  Motor Motor: Hemiplegia;Motor apraxia Motor - Discharge Observations: L hemi and apraxia (gleaned from notes with previous therapists)  Mobility Bed Mobility Bed Mobility: Rolling Right;Rolling Left;Supine to Sit;Sit to Supine (gleaned from notes with previous therapists) Rolling Right: Independent with assistive device Rolling Left: Independent with assistive device Supine to Sit: Minimal Assistance - Patient > 75% Sit to Supine: Moderate Assistance - Patient 50-74% Transfers Transfers: Sit to [employer] (gleaned from notes with previous therapists) Sit to Stand: Moderate Assistance - Patient 50-74% Stand Pivot Transfers: Moderate Assistance - Patient 50 - 74% Stand Pivot Transfer Details: Verbal cues for sequencing;Verbal cues for safe use of DME/AE;Verbal cues for precautions/safety;Verbal cues for technique;Manual facilitation for weight  shifting;Verbal cues for gait pattern Transfer (Assistive device): Rolling walker Locomotion  Gait Ambulation: Yes (gleaned from notes with previous therapists) Gait Assistance: 2 Helpers Gait Distance (Feet): 6 Feet Assistive device: Parallel bars Gait Gait: Yes (gleaned from notes with previous therapists)  Gait Pattern: Left genu recurvatum;Decreased dorsiflexion - right;Decreased dorsiflexion - left;Decreased hip/knee flexion - left;Decreased hip/knee flexion - right Gait velocity: decr Stairs / Additional Locomotion Stairs: No Pick up small object from the floor (from standing position) activity did not occur: Safety/medical concerns Wheelchair Mobility Wheelchair Mobility: Yes (gleaned from notes with previous therapists) Wheelchair Assistance: Minimal assistance - Patient >75% Wheelchair Propulsion: Both upper extremities Wheelchair Parts Management: Needs assistance  Trunk/Postural Assessment  Cervical Assessment Cervical Assessment: Within Functional Limits Thoracic Assessment Thoracic Assessment: Exceptions to [facility] (rounded shoulders) Lumbar Assessment Lumbar Assessment: Exceptions to [facility] (poserior pelvic tilt) Postural Control Postural Control: Deficits on evaluation Trunk Control: decreased  Balance Balance Balance Assessed: Yes (gleaned from notes with previous therapists) Static Sitting Balance Static Sitting - Balance Support: No upper extremity supported;Feet supported Static Sitting - Level of Assistance: 5: Stand by assistance Static Standing Balance Static Standing - Balance Support: Bilateral upper extremity supported;During functional activity Static Standing - Level of Assistance: 4: Min assist Extremity Assessment      RLE Assessment RLE Assessment: Exceptions to [facility] General Strength Comments: unable to test on discharge date d/t leaving program early. Determined to be same as eval: Hip and ankle 4/5, knee 3+/5 limited by pain>strength LLE  Assessment LLE Assessment: Exceptions to [facility] General Strength Comments: unable to test on discharge date d/t leaving program early. Determined to be same as eval: hip 3+/5, knee ext 2/5, knee flex 3+/5. DF 2/5, PF 3+/5, pt blames knee ext srength on knee problems vs stroke   Mliss DELENA Milliner PT, DPT, CSRS 04/01/2024, 3:57 PM

## 2024-04-01 NOTE — Progress Notes (Signed)
 PROGRESS NOTE   Subjective/Complaints:  No acute events noted overnight. Pt has been refusing therapy and family would like to take her home.  Daughter in law married to son who is POA aware of need for 24/7 physical assist   ROS: as per HPI. Denies Chills,CP, SOB, abd pain, N/V/D/C, or any other complaints at this time.     Objective:   No results found. Recent Labs    03/30/24 0413 03/31/24 0644  WBC 5.3 5.5  HGB 10.3* 10.0*  HCT 31.7* 30.8*  PLT 289 285   Recent Labs    03/31/24 0644 04/01/24 0435  NA 138 139  K 4.2 4.4  CL 106 108  CO2 21* 21*  GLUCOSE 144* 88  BUN 55* 55*  CREATININE 1.55* 1.69*  CALCIUM  8.6* 8.6*         Intake/Output Summary (Last 24 hours) at 04/01/2024 0837 Last data filed at 04/01/2024 0748 Gross per 24 hour  Intake 610 ml  Output --  Net 610 ml        Physical Exam: Vital Signs Blood pressure 135/62, pulse 91, temperature 97.9 F (36.6 C), temperature source Oral, resp. rate 18, height 5' 3 (1.6 m), weight 79 kg, SpO2 98%.  General: No acute distress,  lying in bed appears to be overall Mood and affect are appropriate Heart: Irregularly irregular no rubs murmurs or extra sounds Lungs: Clear to auscultation, breathing unlabored, no rales or wheezes Abdomen: Positive bowel sounds, soft nontender to palpation, nondistended Extremities: No clubbing, cyanosis, or edema Skin: No evidence of breakdown, no evidence of rash Neuro: Awake and alert,  Left neglect, cranial nerves II through XII grossly intact  PRIOR EXAMS: Neurologic:Oriented to person and place not time , Severe left neglect and left field cut Cranial nerves II through XII intact, motor strength is 5/5 in RIght deltoid, bicep, tricep, grip, hip flexor, knee extensors, ankle dorsiflexor and plantar flexor LUE 3-/5 proximal to distal , LLE 4-/5 Proximal , 3- at ankle  Sensory exam reduced LUE  Cerebellar  limited on left due to weakness Musculoskeletal: Full range of motion in all 4 extremities. No joint swelling  Assessment/Plan: 1. Functional deficits which require 3+ hours per day of interdisciplinary therapy in a comprehensive inpatient rehab setting. Physiatrist is providing close team supervision and 24 hour management of active medical problems listed below. Physiatrist and rehab team continue to assess barriers to discharge/monitor patient progress toward functional and medical goals  Care Tool:  Bathing    Body parts bathed by patient: Right arm, Left arm, Chest, Abdomen, Face   Body parts bathed by helper: Front perineal area, Buttocks, Right upper leg, Left upper leg, Right lower leg, Left lower leg     Bathing assist Assist Level: Maximal Assistance - Patient 24 - 49%     Upper Body Dressing/Undressing Upper body dressing   What is the patient wearing?: Pull over shirt    Upper body assist Assist Level: Maximal Assistance - Patient 25 - 49%    Lower Body Dressing/Undressing Lower body dressing      What is the patient wearing?: Incontinence brief, Pants     Lower body assist Assist  for lower body dressing: Dependent - Patient 0%     Toileting Toileting    Toileting assist Assist for toileting: Dependent - Patient 0%     Transfers Chair/bed transfer  Transfers assist     Chair/bed transfer assist level: Moderate Assistance - Patient 50 - 74%     Locomotion Ambulation   Ambulation assist      Assist level: 2 helpers Assistive device: Parallel bars Max distance: 6   Walk 10 feet activity   Assist  Walk 10 feet activity did not occur: Safety/medical concerns        Walk 50 feet activity   Assist Walk 50 feet with 2 turns activity did not occur: Safety/medical concerns         Walk 150 feet activity   Assist Walk 150 feet activity did not occur: Safety/medical concerns         Walk 10 feet on uneven surface   activity   Assist Walk 10 feet on uneven surfaces activity did not occur: Safety/medical concerns         Wheelchair     Assist Is the patient using a wheelchair?: Yes Type of Wheelchair: Manual    Wheelchair assist level: Minimal Assistance - Patient > 75% Max wheelchair distance: 150    Wheelchair 50 feet with 2 turns activity    Assist        Assist Level: Minimal Assistance - Patient > 75%   Wheelchair 150 feet activity     Assist      Assist Level: Minimal Assistance - Patient > 75%   Blood pressure 135/62, pulse 91, temperature 97.9 F (36.6 C), temperature source Oral, resp. rate 18, height 5' 3 (1.6 m), weight 79 kg, SpO2 98%.  Medical Problem List and Plan: 1. Functional deficits secondary to Right MCA infarct             -patient may  shower             -ELOS/Goals: 12-16d  -Continue CIR  -Patient has about discharge date,Discussed plans for team conference on Wednesday  2.  Antithrombotics: -DVT/anticoagulation:  Pharmaceutical: Eliquis  5mg  BID             -antiplatelet therapy: ASA 81mg  daily  3. Pain Management: tylenol  prn.   4. Mood/Behavior/Sleep: LCSW to follow for evaluation and support.              -antipsychotic agents: N/A             -melatonin prn for insomnia  -Seroquel  12.5mg  nightly  5. Neuropsych/cognition: This patient is not capable of making decisions on her own behalf.  6. Skin/Wound Care: Routine pressure relief measures  7. Fluids/Electrolytes/Nutrition: routine I&O and labs             --on supplements due to low protein stores  -GERD: pepcid  20mg  daily   8. T2DM: Monitor BS ac/hs. Insulin  glargine decreased to 6 units daily on 11/22 --SSI for elevated BS. BS were likely elevated due to boost between meals-->change to ensure Max.             --titrate insulin  as needed.   -03/30/24 CBGs variable but perhaps improving, monitor with new dose of Lantus .  -11/24 Intermittently elevated, Continue current  regimen and monitor trend For now I do not want to cause hypoglycemia  CBG (last 3)  Recent Labs    03/31/24 1648 03/31/24 2153 04/01/24 0546  GLUCAP 122* 312* 88   Some lability ,  f/u int med  9. Thyrotoxicosis: Was started on methimazole  10mg  daily  10. Acute on chronic renal failure: Encourage fluid intake.  --Recheck in am as BUN/K trending up -03/30/24 Cr 1.47, slightly higher than 11/19-20 but generally similar to prior, seems this is likely close to her baseline (1.2-1.6); K+ 4.5, improving. Monitor routinely.  11/2 BUN and creatinine a lot higher, Encourage oral fluids and recheck tomorrow  11. Hyponatremia: Resolving 140 @ admission to drop to 128-->134 -11/24 stable 138  12. Afib/HTN: Monitor BP TID. Continue Metoprolol  25mg  bid   -11/24 BP stable continue to monitor  Vitals:   03/29/24 2030 03/30/24 0450 03/30/24 1006 03/30/24 2000  BP: 124/62 120/70 135/61 121/62   03/31/24 0349 03/31/24 1424 03/31/24 2038 04/01/24 0521  BP: 132/74 129/75 (!) 151/73 135/62   13.  Sundowning vs visuoperceptual deficits due to stroke - trialed seroquel  but family googled this med and does not want for mom, will d/c, hopefully does better in home environment .   14. Anemia: Hgb 11.0>>10.3, monitor for bleeding and check routine labs  - 11/24 stool occult blood a little bit lower at 10.0 hemoglobin  15. HLD: continue lipitor 40mg  daily, zetia  10mg  daily  16. Constipation: continue senna 17.2mg  nightly      LOS: 3 days A FACE TO FACE EVALUATION WAS PERFORMED  Prentice FORBES Compton 04/01/2024, 8:37 AM

## 2024-04-01 NOTE — Plan of Care (Signed)
 Per CVA 2 team patient is medically stable and anxious for discharge. Discharging to home with [facility] follow up services 04/01/24. Daughter in law married to son who is HCPOA at bedside; family aware of need for 24/7 care with physical assist as well as importance with medical f/u compliance. Have reviewed medications for secondary risk management and dietary modification recommendations with the patient. Has DME from previous admit: (hospital bed, wc, tub seat, toliet riser, drop-arm bsc, grab bars) . Megan Fischer

## 2024-04-01 NOTE — Discharge Instructions (Addendum)
 Inpatient Rehab Discharge Instructions  Megan Fischer Discharge date and time: 04/01/24   Activities/Precautions/ Functional Status: Activity: no lifting, driving, or strenuous exercise  till cleared by MD Diet: cardiac diet and diabetic diet. Need to increase fluid intake as you are getting dehydrated again.  Wound Care: none needed   Functional status:  ___ No restrictions     ___ Walk up steps independently _X__ 24/7 supervision/assistance   ___ Walk up steps with assistance ___ Intermittent supervision/assistance  ___ Bathe/dress independently ___ Walk with walker     _X__ Bathe/dress with assistance ___ Walk Independently    ___ Shower independently ___ Walk with assistance    ___ Shower with assistance _X__ No alcohol     ___ Return to work/school ________   Special Instructions: Monitor blood sugars before meals and at bedtime.    COMMUNITY REFERRALS UPON DISCHARGE:    Home Health:   PT   OT     SP                Agency: [facility]   Phone:[phone]   Medical Equipment/Items Ordered:HAS FROM PREVIOUS ADMIT 02/2024                                                 Agency/Supplier:NA    My questions have been answered and I understand these instructions. I will adhere to these goals and the provided educational materials after my discharge from the hospital.  Patient/Caregiver Signature _______________________________ Date __________  Clinician Signature _______________________________________ Date __________  Please bring this form and your medication list with you to all your follow-up doctor's appointments.

## 2024-04-01 NOTE — Plan of Care (Signed)
 Goals not met d/t Pt and caregiver request to d/c sooner than anticipated and recommended by evaluating therapy team. Pt, caregivers, and medical team in agreement that Pt is safe to d/c home at her CLOF as Pt's DTR in law and son feel comfortable and confident in their abilities to take care of her.   Problem: RH Bathing Goal: LTG Patient will bathe all body parts with assist levels (OT) Description: LTG: Patient will bathe all body parts with assist levels (OT) Outcome: Not Met (add Reason)   Problem: RH Dressing Goal: LTG Patient will perform upper body dressing (OT) Description: LTG Patient will perform upper body dressing with assist, with/without cues (OT). Outcome: Not Met (add Reason) Goal: LTG Patient will perform lower body dressing w/assist (OT) Description: LTG: Patient will perform lower body dressing with assist, with/without cues in positioning using equipment (OT) Outcome: Not Met (add Reason)   Problem: RH Toileting Goal: LTG Patient will perform toileting task (3/3 steps) with assistance level (OT) Description: LTG: Patient will perform toileting task (3/3 steps) with assistance level (OT)  Outcome: Not Met (add Reason)   Problem: RH Toilet Transfers Goal: LTG Patient will perform toilet transfers w/assist (OT) Description: LTG: Patient will perform toilet transfers with assist, with/without cues using equipment (OT) Outcome: Not Met (add Reason)   Problem: RH Tub/Shower Transfers Goal: LTG Patient will perform tub/shower transfers w/assist (OT) Description: LTG: Patient will perform tub/shower transfers with assist, with/without cues using equipment (OT) Outcome: Not Met (add Reason)   Problem: RH Functional Use of Upper Extremity Goal: LTG Patient will use RT/LT upper extremity as a (OT) Description: LTG: Patient will use right/left upper extremity as a stabilizer/gross assist/diminished/nondominant/dominant level with assist, with/without cues during functional  activity (OT) Outcome: Completed/Met

## 2024-04-01 NOTE — Plan of Care (Signed)
 Goals not met due to patient/family decision to discharge from rehab program and thus received limited tx  Problem: RH Expression Communication Goal: LTG Patient will increase word finding of common (SLP) Description: LTG:  Patient will increase word finding of common objects/daily info/abstract thoughts with cues using compensatory strategies (SLP). 04/01/2024 0943 by Stephanie Blower F, CCC-SLP Outcome: Not Met (add Reason) 04/01/2024 0936 by Stephanie Blower F, CCC-SLP Flowsheets (Taken 04/01/2024 [phone]) LTG: Patient will increase word finding of common (SLP): Moderate Assistance - Patient 50 - 74%   Problem: RH Memory Goal: LTG Patient will use memory compensatory aids to (SLP) Description: LTG:  Patient will use memory compensatory aids to recall biographical/new, daily complex information with cues (SLP) 04/01/2024 0943 by Stephanie Blower F, CCC-SLP Outcome: Not Met (add Reason) 04/01/2024 0936 by Stephanie Blower F, CCC-SLP Flowsheets (Taken 04/01/2024 0935) LTG: Patient will use memory compensatory aids to (SLP): Moderate Assistance - Patient 50 - 74%   Problem: RH Attention Goal: LTG Patient will demonstrate this level of attention during functional activites (SLP) Description: LTG:  Patient will will demonstrate this level of attention during functional activites (SLP) 04/01/2024 0943 by Stephanie Blower F, CCC-SLP Outcome: Not Met (add Reason) 04/01/2024 0936 by Stephanie Blower F, CCC-SLP Flowsheets (Taken 04/01/2024 [phone]) Patient will demonstrate during cognitive/linguistic activities the attention type of: Sustained LTG: Patient will demonstrate this level of attention during cognitive/linguistic activities with assistance of (SLP): Moderate Assistance - Patient 50 - 74%

## 2024-04-01 NOTE — Plan of Care (Signed)
  Problem: RH Balance Goal: LTG Patient will maintain dynamic standing balance (PT) Description: LTG:  Patient will maintain dynamic standing balance with assistance during mobility activities (PT) Outcome: Not Met (add Reason) Flowsheets (Taken 04/01/2024 1603) LTG: Pt will maintain dynamic standing balance during mobility activities with:: Moderate Assistance - Patient 50 - 74% Note: Pt and family deciding to discharge early prior to completion of rehab program. Leaving AMA today.    Problem: Sit to Stand Goal: LTG:  Patient will perform sit to stand with assistance level (PT) Description: LTG:  Patient will perform sit to stand with assistance level (PT) Outcome: Not Met (add Reason) Flowsheets (Taken 04/01/2024 1603) LTG: PT will perform sit to stand in preparation for functional mobility with assistance level: Moderate Assistance - Patient 50 - 74% Note: Pt and family deciding to discharge early prior to completion of rehab program. Leaving AMA today.    Problem: RH Bed Mobility Goal: LTG Patient will perform bed mobility with assist (PT) Description: LTG: Patient will perform bed mobility with assistance, with/without cues (PT). Outcome: Not Met (add Reason) Flowsheets (Taken 04/01/2024 1603) LTG: Pt will perform bed mobility with assistance level of: Moderate Assistance - Patient 50 - 74% Note: Pt and family deciding to discharge early prior to completion of rehab program. Leaving AMA today.    Problem: RH Bed to Chair Transfers Goal: LTG Patient will perform bed/chair transfers w/assist (PT) Description: LTG: Patient will perform bed to chair transfers with assistance (PT). Outcome: Not Met (add Reason) Flowsheets (Taken 04/01/2024 1603) LTG: Pt will perform Bed to Chair Transfers with assistance level: Moderate Assistance - Patient 50 - 74% Note: Pt and family deciding to discharge early prior to completion of rehab program. Leaving AMA today.    Problem: RH Ambulation Goal:  LTG Patient will ambulate in controlled environment (PT) Description: LTG: Patient will ambulate in a controlled environment, # of feet with assistance (PT). Outcome: Not Met (add Reason) Flowsheets (Taken 04/01/2024 1603) LTG: Pt will ambulate in controlled environ  assist needed:: Total Assistance - Patient < 25% Note: Pt and family deciding to discharge early prior to completion of rehab program. Leaving AMA today.

## 2024-04-01 NOTE — Progress Notes (Signed)
 Physical Therapy Session Note  Patient Details  Name: Megan Fischer MRN: [id number] Date of Birth: April 29, 1951  Today's Date: 04/01/2024 PT Missed Time: 75 Minutes Missed Time Reason: Patient unwilling to participate  Short Term Goals: Week 1:  PT Short Term Goal 1 (Week 1): Pt will perform STS with RW PT Short Term Goal 2 (Week 1): Pt will initiate bed/chair transfer with LRAD PT Short Term Goal 3 (Week 1): Pt will initiate gait with RW  Skilled Therapeutic Interventions/Progress Updates:  Discussion prior to session with therapy team re: pt's family requesting to take patient home today d/t her continuous refusal to participate and her desire to go home.   Patient supine in bed asleep on entrance to room. Patient requires time and effort to rouse. Pt unhappy with being woken and refuses therapy saying that she's going home and just waiting for her ride. Not agreeable to PT session.   Patient with no appearance of pain complaint.  Patient left supine in bed at end of session with brakes locked, bed alarm set, and all needs within reach.  Pt missed 75 min of skilled therapy due to refusal to participate. Unable to reschedule d/t pt leaving program early.    Therapy Documentation Precautions:  Precautions Precautions: Fall, Other (comment) Recall of Precautions/Restrictions: Impaired Precaution/Restrictions Comments: L homonymous hemianopsia, watch HR Restrictions Weight Bearing Restrictions Per Provider Order: No General: PT Amount of Missed Time (min): 75 Minutes PT Missed Treatment Reason: Patient unwilling to participate  Pain: No appearance of pain.   Therapy/Group: Individual Therapy  Mliss DELENA Milliner PT, DPT, CSRS 04/01/2024, 3:10 PM

## 2024-04-01 NOTE — Progress Notes (Signed)
 SLP Cancellation Note  Patient Details Name: Megan Fischer MRN: [id number] DOB: Nov 07, 1950   Cancelled treatment:       Upon SLP entrance, daughter in law present. Daughter in law reporting patient is not to do any therapy today and is going to be d/c as patient is ready to go home. SLP notified patients RN and remaining therapists. Patient missed 60 minutes of therapy due to refusal.                                                                                                 Benoit Meech M.A., CCC-SLP 04/01/2024, 8:08 AM

## 2024-04-01 NOTE — IPOC Note (Signed)
 Overall Plan of Care [facility]) Patient Details Name: Megan Fischer MRN: [id number] DOB: October 13, 1950  Admitting Diagnosis: Acute ischemic right MCA stroke [facility])  Hospital Problems: Principal Problem:   Acute ischemic right MCA stroke [facility])     Functional Problem List: Nursing Bladder, Bowel, Endurance, Medication Management, Safety  PT Balance, Skin Integrity, Pain, Edema, Behavior, Safety, Endurance, Motor, Sensory  OT Sensory, Endurance, Motor, Perception, Safety, Vision  SLP Cognition, Perception  TR         Basic ADL's: OT Grooming, Bathing, Dressing, Toileting     Advanced  ADL's: OT       Transfers: PT Bed Mobility, Bed to Chair, Customer Service Manager, Tub/Shower     Locomotion: PT Ambulation, Wheelchair Mobility     Additional Impairments: OT Fuctional Use of Upper Extremity  SLP        TR      Anticipated Outcomes Item Anticipated Outcome  Self Feeding    Swallowing      Basic self-care  Min A  Toileting  Min A   Bathroom Transfers Min A  Bowel/Bladder  Manage bowel w mod I assist and bladder w toileting  Transfers  min a with LRAD  Locomotion  supervision w/c, min a gait with LRAD  Communication     Cognition  mod A  Pain  n/a  Safety/Judgment  manage safety w cues   Therapy Plan: PT Intensity: Minimum of 1-2 x/day ,45 to 90 minutes PT Frequency: 5 out of 7 days PT Duration Estimated Length of Stay: 12-14 days OT Intensity: Minimum of 1-2 x/day, 45 to 90 minutes OT Frequency: 5 out of 7 days OT Duration/Estimated Length of Stay: 12-14 days SLP Intensity: Minumum of 1-2 x/day, 30 to 90 minutes SLP Frequency: 3 to 5 out of 7 days SLP duration : 12-14 days  Team Interventions: Nursing Interventions Disease Management/Prevention, Discharge Planning, Pain Management, Medication Management, Bladder Management, Bowel Management, Patient/Family Education  PT interventions Ambulation/gait training, Cognitive remediation/compensation, Discharge  planning, DME/adaptive equipment instruction, Functional mobility training, Pain management, Psychosocial support, Therapeutic Activities, Splinting/orthotics, UE/LE Strength taining/ROM, Visual/perceptual remediation/compensation, Wheelchair propulsion/positioning, UE/LE Coordination activities, Therapeutic Exercise, Stair training, Skin care/wound management, Patient/family education, Neuromuscular re-education, Functional electrical stimulation, Disease management/prevention, Firefighter, Warden/ranger  OT Interventions Warden/ranger, Neuromuscular re-education, Self Care/advanced ADL retraining, Therapeutic Exercise, Wheelchair propulsion/positioning, UE/LE Strength taining/ROM, Cognitive remediation/compensation, Functional electrical stimulation, Patient/family education, UE/LE Coordination activities, Visual/perceptual remediation/compensation, Therapeutic Activities, Functional mobility training, Discharge planning  SLP Interventions Cognitive remediation/compensation, Functional tasks, Patient/family education, Internal/external aids, Cueing hierarchy, Medication managment, Therapeutic Activities, Therapeutic Exercise  TR Interventions    SW/CM Interventions Discharge Planning, Psychosocial Support, Patient/Family Education   Barriers to Discharge MD  Patient refused to participate in rehab therapies  Nursing Decreased caregiver support level /level entry w son  PT (!) Home environment best boy, Pending surgery    OT [employer]    SLP      SW Insurance for SNF coverage     Team Discharge Planning: Destination: PT-Home ,OT- Home , SLP-Home Projected Follow-up: PT-Home health PT, OT-  Home health OT, SLP-Home Health SLP Projected Equipment Needs: PT-To be determined, OT- To be determined, SLP-None recommended by SLP Equipment Details: PT- , OT-Owns RW, rollator, toilet riser, grab bars - tub/shower, SPC, TTB Patient/family involved in discharge  planning: PT- Patient unable/family or caregiver not available,  OT-Patient, SLP-Patient, Family member/caregiver  MD ELOS: 12-14d Medical Rehab Prognosis:  Fair Assessment: The patient has been admitted for CIR therapies with  the diagnosis of RIght MCA infarct . The team will be addressing functional mobility, strength, stamina, balance, safety, adaptive techniques and equipment, self-care, bowel and bladder mgt, patient and caregiver education, management of sundowning. Goals have been set at [facility] but is discharging at [facility] since pt and family insist on bringing pt home after only 3 d on CIR. Anticipated discharge destination is Home with 24/7 care from family.        See Team Conference Notes for weekly updates to the plan of care

## 2024-04-04 DIAGNOSIS — R414 Neurologic neglect syndrome: Secondary | ICD-10-CM | POA: Insufficient documentation

## 2024-04-04 DIAGNOSIS — I69320 Aphasia following cerebral infarction: Secondary | ICD-10-CM

## 2024-04-04 DIAGNOSIS — N179 Acute kidney failure, unspecified: Secondary | ICD-10-CM | POA: Insufficient documentation

## 2024-04-04 DIAGNOSIS — D649 Anemia, unspecified: Secondary | ICD-10-CM | POA: Insufficient documentation

## 2024-04-04 DIAGNOSIS — I69319 Unspecified symptoms and signs involving cognitive functions following cerebral infarction: Secondary | ICD-10-CM

## 2024-04-04 NOTE — Discharge Summary (Signed)
 Physician Discharge Summary  Patient ID: Megan Fischer MRN: [id number] DOB/AGE: December 18, 1950 73 y.o.  Admit date: 03/29/2024 Discharge date: 04/01/2024  Discharge Diagnoses:  Principal Problem:   Acute ischemic right MCA stroke [facility]) Active Problems:   Thyrotoxicosis   Pulmonary artery hypertension (HCC)   Pulmonary embolism (HCC)   Acute on chronic renal failure   Acute on chronic anemia   Aphasia as late effect of stroke   Hemi-inattention--left   Cognitive deficits following cerebral infarction   Discharged Condition: stable  Significant Diagnostic Studies: N/A   Labs:  Basic Metabolic Panel: Recent Labs  Lab 03/30/24 0413 03/31/24 0644 04/01/24 0435  NA 138 138 139  K 4.5 4.2 4.4  CL 107 106 108  CO2 24 21* 21*  GLUCOSE 92 144* 88  BUN 46* 55* 55*  CREATININE 1.47* 1.55* 1.69*  CALCIUM  8.6* 8.6* 8.6*    CBC:    Latest Ref Rng & Units 03/31/2024    6:44 AM 03/30/2024    4:13 AM 03/28/2024    3:03 AM  CBC  WBC 4.0 - 10.5 K/uL 5.5  5.3  6.0   Hemoglobin 12.0 - 15.0 g/dL 89.9  89.6  88.9   Hematocrit 36.0 - 46.0 % 30.8  31.7  33.7   Platelets 150 - 400 K/uL 285  289  260      CBG: Recent Labs  Lab 03/31/24 1648 03/31/24 2153 04/01/24 0546 04/01/24 1154 04/01/24 1617  GLUCAP 122* 312* 88 239* 132*    Brief HPI:   Megan Fischer is a 73 y.o. female with history of HTN, T2DM, A fib, giant cell tumor of left tibia, multinodular goiter, CKD 4, recent R-PCA stroke who was admitted on 03/18/24 with reports of HA, confusion and restlessness. She was found to have euglycemic DKA, thyrotoxicosis and started on methimazole . She developed LE edema with rise in BNP and 2 D echo done revealing normal EF but severely elevated PAH. CTA chest and brain ordered per cards due to ongoing issues with lethargy and confusion  She was found to have submassive PE with right heart strain and new acute R-MCA and extensive subacute R-PCA infarct. Neurology recommended holding  IV heparin  for 1-2 days due to evidence of petechial hemorrhage. Pan CT negative for mets and MRI tib/fib done negative for recurrent tumor. Stroke questioned due to paradoxical embolus from PE and DOAC resumed. Mentation was improving but she continued to be limited by left inattention, cognitive deficits and weakness. She required min to mod assist with ADLs and mobility. CIR recommended due to functional decline.    Hospital Course: Megan Fischer was admitted to rehab 03/29/2024 for inpatient therapies to consist of PT, ST and OT at least three hours five days a week. Past admission physiatrist, therapy team and rehab RN have worked together to provide customized collaborative inpatient rehab. She was maintained on Eliquis  bid with ASA daily. Family has been educated on importance of taking DOAC on bid basis. Her blood pressures were monitored on TID basis and has been stable overall. She has had issues with sundowning v/s visual and Seroquel  Trialed but discontinued per family request.   Diabetes has been monitored with ac/hs CBG checks and SSI was use prn for tighter BS control. BS noted to be labile and lantus  not adjusted to avoid hypoglycemia. Family advised to monitor BS ac/hs basis and follow up with endocrinology for input on insulin . She was advised to increase fluid intake due to rise in BUN and  not to resume jardiance  after discharge. She is tolerating methimazole  10 mg daily. Follow up CBC showed  H/H down to 10.0 without signs of bleeding. Follow up check of lytes showed acute on chronic renal failure. She has been limited by aphasia, left inattention and refusal to participate in therapy. Family requested discharge to home as they felt that she would be better suited with home therapy. She will resume HHPT, HHOT and HHST via [facility] after discharge.    Rehab course: During patient's stay in rehab weekly team conferences were held to monitor patient's progress, set goals and  discuss barriers to discharge. At admission, patient required mod assist with Mobility and max assist with ADL tasks. She refused to participate with therapy and continued to require extensive assistance. She requires max assist with ADL tasks. She required min to mod assist for transfers and was able to ambulate with +2 assist in parallel bars. She requires mod to max assist with cognitive tasks. Family aware of care needed and will assist after discharge.     Discharge disposition: 06-Home-Health Care Svc  Diet: Heart Healthy/ Diabetic  Special Instructions: Recommend repeat check of BMET and CBC in one week to monitor renal status and H/H.  Monitor BS ac/hs and follow up with Dr. Babara for input on adjustment of insulin  Family to assist with medications for compliance.   Allergies as of 04/01/2024       Reactions   Aspirin  Nausea Only   Patient stated to neuro that it caused an upset stomach   Codeine Other (See Comments)   Unknown    Fentanyl  Other (See Comments)   hypoxia   Oxycodone Hcl Other (See Comments)   Unknown    Penicillins Other (See Comments)   Unknown    Chlorhexidine Gluconate Rash        Medication List     STOP taking these medications    amLODipine  2.5 MG tablet Commonly known as: NORVASC    benazepril  20 MG tablet Commonly known as: LOTENSIN    CALCIUM  PO   celecoxib  200 MG capsule Commonly known as: CeleBREX    DULoxetine  60 MG capsule Commonly known as: CYMBALTA    gabapentin  100 MG capsule Commonly known as: NEURONTIN    Jardiance  25 MG Tabs tablet Generic drug: empagliflozin    methocarbamol  500 MG tablet Commonly known as: ROBAXIN    Ozempic  (2 MG/DOSE) 8 MG/3ML Sopn Generic drug: Semaglutide  (2 MG/DOSE)       TAKE these medications    acetaminophen  325 MG tablet Commonly known as: TYLENOL  Take 1-2 tablets (325-650 mg total) by mouth every 4 (four) hours as needed for mild pain (pain score 1-3).   apixaban  5 MG Tabs  tablet Commonly known as: Eliquis  Take 1 tablet (5 mg total) by mouth 2 (two) times daily.   aspirin  EC 81 MG tablet Take 1 tablet (81 mg total) by mouth daily. Swallow whole.   Blood Glucose Monitoring Suppl Devi 1 each by Does not apply route as directed. Dispense based on patient and insurance preference. Use up to four times daily as directed. (FOR ICD-10 E10.9, E11.9).   BLOOD GLUCOSE TEST STRIPS Strp 1 each by Does not apply route as directed. Dispense based on patient and insurance preference. Use up to four times daily as directed. (FOR ICD-10 E10.9, E11.9).   camphor-menthol  lotion Commonly known as: SARNA Apply topically as needed for itching.   diclofenac  Sodium 1 % Gel Commonly known as: VOLTAREN  Apply 2 g topically 4 (four) times daily.  To both knees   ezetimibe  10 MG tablet Commonly known as: ZETIA  Take 1 tablet (10 mg total) by mouth daily.   famotidine  20 MG tablet Commonly known as: PEPCID  Take 1 tablet (20 mg total) by mouth daily.   ferrous sulfate  325 (65 FE) MG EC tablet Take 1 tablet (325 mg total) by mouth 2 (two) times daily.   glimepiride  1 MG tablet Commonly known as: AMARYL  Take 1 mg by mouth daily with breakfast.   Lancet Device Misc 1 each by Does not apply route as directed. Dispense based on patient and insurance preference. Use up to four times daily as directed. (FOR ICD-10 E10.9, E11.9).   Lancets Misc 1 each by Does not apply route as directed. Dispense based on patient and insurance preference. Use up to four times daily as directed. (FOR ICD-10 E10.9, E11.9).   Lantus  SoloStar 100 UNIT/ML Solostar Pen Generic drug: insulin  glargine Inject 6 Units into the skin daily.   lidocaine  5 % Commonly known as: LIDODERM  Place 2 patches onto the skin daily. Remove & Discard patch within 12 hours or as directed by MD Notes to patient: Can resume at home   melatonin 5 MG Tabs Take 1 tablet (5 mg total) by mouth at bedtime as needed. Notes  to patient: For insomnia   methimazole  10 MG tablet Commonly known as: TAPAZOLE  Take 1 tablet (10 mg total) by mouth daily. What changed:  medication strength how much to take   metoprolol  tartrate 25 MG tablet Commonly known as: LOPRESSOR  Take 1 tablet (25 mg total) by mouth 2 (two) times daily.   multivitamin with minerals tablet Take 1 tablet by mouth daily.   polyethylene glycol 17 g packet Commonly known as: MIRALAX  / GLYCOLAX  Take 17 g by mouth daily as needed for mild constipation.   rosuvastatin  20 MG tablet Commonly known as: CRESTOR  Take 1 tablet (20 mg total) by mouth daily after supper.   senna 8.6 MG Tabs tablet Commonly known as: SENOKOT Take 2 tablets (17.2 mg total) by mouth daily.   TechLite Pen Needles 32G X 4 MM Misc Generic drug: Insulin  Pen Needle Use daily at 6am with insulin  pen.        Follow-up Information     Bernadine Manos, MD. Call today.   Specialty: Internal Medicine Why: For follow up tomorrow or Friday. Need to have kidney function rechecked as getting dehydrated. Contact information: [address] [place] [address] [phone]         Carilyn Prentice BRAVO, MD. Call.   Specialty: Physical Medicine and Rehabilitation Why: As needed Contact information: [address] [place] [address] [phone]         Ladona Heinz, MD. Call.   Specialty: Cardiology Why: for follow up appointment. Contact information: [address] [place] [address] [phone]         Thapa, Sudan, MD. Call.   Specialty: Endocrinology Why: for follow up appointment Contact information: [address] [place] [address] [phone]                 Signed: Sharlet GORMAN Schmitz 04/04/2024, 3:44 PM

## 2024-04-08 ENCOUNTER — Telehealth: Payer: Self-pay

## 2024-04-08 ENCOUNTER — Other Ambulatory Visit (HOSPITAL_COMMUNITY): Payer: Self-pay

## 2024-04-08 DIAGNOSIS — I4891 Unspecified atrial fibrillation: Secondary | ICD-10-CM

## 2024-04-08 DIAGNOSIS — E7800 Pure hypercholesterolemia, unspecified: Secondary | ICD-10-CM

## 2024-04-08 NOTE — Telephone Encounter (Unsigned)
 Copied from CRM #[id number]. Topic: Clinical - Medication Refill >> Apr 08, 2024  1:15 PM Megan Fischer wrote: Medication: aspirin  EC 81 MG tablet rosuvastatin  (CRESTOR ) 20 MG tablet famotidine  (PEPCID ) 20 MG tablet   Has the patient contacted their pharmacy? Yes (Agent: If no, request that the patient contact the pharmacy for the refill. If patient does not wish to contact the pharmacy document the reason why and proceed with request.) (Agent: If yes, when and what did the pharmacy advise?)  This is the patient's preferred pharmacy:  [facility] [id number] - [place], [place] - [address] [place] [address] Phone: [phone] Fax: [phone]  Is this the correct pharmacy for this prescription? Yes If no, delete pharmacy and type the correct one.   Has the prescription been filled recently? Yes  Is the patient out of the medication? No  Has the patient been seen for an appointment in the last year OR does the patient have an upcoming appointment? Yes  Can we respond through MyChart? Yes  Agent: Please be advised that Rx refills may take up to 3 business days. We ask that you follow-up with your pharmacy.

## 2024-04-09 NOTE — Addendum Note (Signed)
 Addended by: Terrionna Bridwell on: 04/09/2024 12:32 PM   Modules accepted: Orders

## 2024-04-09 NOTE — Progress Notes (Deleted)
  Cardiology Office Note   Date:  04/09/2024  ID:  Megan Fischer, Megan Fischer 04/02/1951, MRN [id number] PCP: Megan Manos, MD  [facility] Providers Cardiologist:  Gordy Bergamo, MD   History of Present Illness Megan Fischer is a 73 y.o. female with a past medical history of hypertension, diabetes mellitus, stage IIIa chronic kidney disease, persistent atrial fibrillation, here for follow-up appointment.  She was experiencing occasional chest pain back in August when she saw Dr. Bergamo that began on the left side and radiated to the upper abdomen.  The pain occurred infrequently and could happen while sitting or sleeping.  She also noticed bruising on her legs which she attributes to Eliquis .  There is no blood in her stool.  She has experienced some weight loss and is uncertain of the cause.  Her current medications include Ozempic , Eliquis  5 mg twice a day, diltiazem  360 mg daily, benazepril  20 mg daily, Lasix  40 mg daily, and rosuvastatin  20 mg daily.  She has limited mobility and uses a walker.  She lives alone most of the day with her son currently staying with her.  Her diet is inconsistent and often eating what ever she wants and sometimes skipping meals.  She cooks at home and rarely eats out.  Today, she***  ROS: Pertinent  ROS in HPI  Studies Reviewed     Echo 03/19/2024 IMPRESSIONS     1. Left ventricular ejection fraction, by estimation, is 50 to 55%. The  left ventricle has low normal function. The left ventricle has no regional  wall motion abnormalities. Left ventricular diastolic function could not  be evaluated.   2. Right ventricular systolic function is mildly reduced. The right  ventricular size is normal. There is severely elevated pulmonary artery  systolic pressure.   3. Left atrial size was moderately dilated.   4. The mitral valve is degenerative. Mild mitral valve regurgitation. No  evidence of mitral stenosis.   5. The aortic valve is normal in structure.  Aortic valve regurgitation is  not visualized. No aortic stenosis is present.   6. The inferior vena cava is normal in size with greater than 50%  respiratory variability, suggesting right atrial pressure of 3 mmHg.   Risk Assessment/Calculations {Does this patient have ATRIAL FIBRILLATION?:[phone]} No BP recorded.  {Refresh Note OR Click here to enter BP  :1}***       Physical Exam VS:  There were no vitals taken for this visit.       Wt Readings from Last 3 Encounters:  04/01/24 174 lb 2.6 oz (79 kg)  03/29/24 179 lb 3.7 oz (81.3 kg)  02/28/24 178 lb (80.7 kg)    GEN: Well nourished, well developed in no acute distress NECK: No JVD; No carotid bruits CARDIAC: ***RRR, no murmurs, rubs, gallops RESPIRATORY:  Clear to auscultation without rales, wheezing or rhonchi  ABDOMEN: Soft, non-tender, non-distended EXTREMITIES:  No edema; No deformity   ASSESSMENT AND PLAN Permanent atrial fibrillation on anticoagulation and rate controlled Hypertension Hyperlipidemia with suboptimal LDL control Type 2 diabetes mellitus Chest pain, intermittent and nonexertional    {Are you ordering a CV Procedure (e.g. stress test, cath, DCCV, TEE, etc)?   Press F2        :[id number]}  Dispo: ***  Signed, Orren LOISE Fabry, PA-C

## 2024-04-09 NOTE — Telephone Encounter (Signed)
 Appt has been changed to a sooner appt listed below:  Name: Megan Fischer, Megan Fischer MRN: [id number]  Date: 04/10/2024 Status: Sch  Time: 3:15 PM Length: 30  Visit Type: HOSPITAL FOLLOW UP [8005] Copay: $0.00  Provider: Renne Homans, MD      Copied from CRM [id number]. Topic: Appointments - Scheduling Inquiry for Clinic >> Apr 07, 2024  4:20 PM DeAngela L wrote: Reason for CRM: patient son calling cause his mother was discharged 03/29/2024 and would like a closer hospital calling to schedule f/u appointment  before 04/21/2024 1st available that popped up   Son [phone]

## 2024-04-10 ENCOUNTER — Other Ambulatory Visit: Payer: Self-pay

## 2024-04-10 ENCOUNTER — Encounter: Payer: Self-pay | Admitting: Student

## 2024-04-10 ENCOUNTER — Ambulatory Visit (INDEPENDENT_AMBULATORY_CARE_PROVIDER_SITE_OTHER): Admitting: Student

## 2024-04-10 ENCOUNTER — Other Ambulatory Visit (HOSPITAL_COMMUNITY): Payer: Self-pay

## 2024-04-10 VITALS — BP 171/93 | HR 84 | Temp 98.2°F

## 2024-04-10 DIAGNOSIS — E0590 Thyrotoxicosis, unspecified without thyrotoxic crisis or storm: Secondary | ICD-10-CM

## 2024-04-10 DIAGNOSIS — E1122 Type 2 diabetes mellitus with diabetic chronic kidney disease: Secondary | ICD-10-CM

## 2024-04-10 DIAGNOSIS — E1129 Type 2 diabetes mellitus with other diabetic kidney complication: Secondary | ICD-10-CM

## 2024-04-10 DIAGNOSIS — I10 Essential (primary) hypertension: Secondary | ICD-10-CM

## 2024-04-10 DIAGNOSIS — N1832 Chronic kidney disease, stage 3b: Secondary | ICD-10-CM | POA: Diagnosis not present

## 2024-04-10 DIAGNOSIS — R12 Heartburn: Secondary | ICD-10-CM

## 2024-04-10 DIAGNOSIS — I679 Cerebrovascular disease, unspecified: Secondary | ICD-10-CM | POA: Diagnosis not present

## 2024-04-10 DIAGNOSIS — E7800 Pure hypercholesterolemia, unspecified: Secondary | ICD-10-CM | POA: Diagnosis not present

## 2024-04-10 DIAGNOSIS — I129 Hypertensive chronic kidney disease with stage 1 through stage 4 chronic kidney disease, or unspecified chronic kidney disease: Secondary | ICD-10-CM | POA: Diagnosis not present

## 2024-04-10 LAB — GLUCOSE, CAPILLARY: Glucose-Capillary: 120 mg/dL — ABNORMAL HIGH (ref 70–99)

## 2024-04-10 MED ORDER — FAMOTIDINE 20 MG PO TABS
20.0000 mg | ORAL_TABLET | Freq: Every day | ORAL | 3 refills | Status: AC
  Filled 2024-04-10 – 2024-04-30 (×2): qty 30, 30d supply, fill #0

## 2024-04-10 MED ORDER — METOPROLOL TARTRATE 25 MG PO TABS
25.0000 mg | ORAL_TABLET | Freq: Two times a day (BID) | ORAL | 3 refills | Status: DC
  Filled 2024-04-10 – 2024-04-30 (×2): qty 60, 30d supply, fill #0

## 2024-04-10 MED ORDER — FAMOTIDINE 20 MG PO TABS: 20.0000 mg | ORAL_TABLET | Freq: Every day | ORAL | 0 refills | Status: DC

## 2024-04-10 MED ORDER — INSULIN GLARGINE 100 UNIT/ML SOLOSTAR PEN: 10.0000 [IU] | PEN_INJECTOR | Freq: Every day | SUBCUTANEOUS | Status: DC

## 2024-04-10 MED ORDER — METHIMAZOLE 10 MG PO TABS
10.0000 mg | ORAL_TABLET | Freq: Every day | ORAL | 3 refills | Status: DC
  Filled 2024-04-10 – 2024-04-30 (×2): qty 30, 30d supply, fill #0

## 2024-04-10 MED ORDER — OLMESARTAN MEDOXOMIL 20 MG PO TABS
20.0000 mg | ORAL_TABLET | Freq: Every day | ORAL | 2 refills | Status: DC
  Filled 2024-04-10 (×2): qty 30, 30d supply, fill #0

## 2024-04-10 MED ORDER — ROSUVASTATIN CALCIUM 20 MG PO TABS: 20.0000 mg | ORAL_TABLET | Freq: Every day | ORAL | 0 refills | Status: DC

## 2024-04-10 MED ORDER — ROSUVASTATIN CALCIUM 20 MG PO TABS
20.0000 mg | ORAL_TABLET | Freq: Every day | ORAL | 3 refills | Status: AC
  Filled 2024-04-10: qty 30, 30d supply, fill #0

## 2024-04-10 MED ORDER — ASPIRIN 81 MG PO TBEC: 81.0000 mg | DELAYED_RELEASE_TABLET | Freq: Every day | ORAL | 0 refills | Status: DC

## 2024-04-10 NOTE — Assessment & Plan Note (Deleted)
  Orders:   TSH   T4, free

## 2024-04-10 NOTE — Assessment & Plan Note (Addendum)
 Chronic, no signs of overt hyperthyroid. Check labs today. Continue methimazole  10 mg daily. Orders:   TSH   T4, free   methimazole  (TAPAZOLE ) 10 MG tablet; Take 1 tablet (10 mg total) by mouth daily.

## 2024-04-10 NOTE — Assessment & Plan Note (Addendum)
 Lab Results  Component Value Date   HGBA1C 7.7 (H) 03/23/2024   HGBA1C 6.5 (H) 01/22/2024   HGBA1C 7.8 (H) 10/12/2023   Chronic, variable glycemic control. On 10 units glargine daily. No hypoglycemia. Fasting glucose routinely >120. Can increase glargine by 1 unit every few days for fasting glucose >120, aiming for a.m. fasting sugar of 70-120. Jardiance  was discontinued on discharge due to DKA and should not be restarted. Sulfonylurea could be restarted for better glycemic control. Semaglutide  should be restarted because of benefit profile given comorbid CKD and stroke, but son is hesitant as he attributes resumption to her recent decline.  Orders:   Glucose, capillary   Basic metabolic panel with GFR; Future   insulin  glargine (LANTUS ) 100 UNIT/ML Solostar Pen; Inject 10 Units into the skin daily.

## 2024-04-10 NOTE — Patient Instructions (Signed)
 VISIT SUMMARY: Today, we reviewed your health conditions, including diabetes, hypertension, and chronic kidney disease. We made some adjustments to your medications to better manage your blood sugar and blood pressure levels.  YOUR PLAN: TYPE 2 DIABETES MELLITUS: Your blood sugar levels have been higher than the target range. -Continue taking 10 units of insulin  daily. -Increase your insulin  dose by 1 unit if your morning blood sugar is consistently above 120 mg/dL. -Decrease your insulin  dose if your morning blood sugar is below 70 mg/dL. -Check your blood sugar before you leave the clinic today.  ESSENTIAL HYPERTENSION: Your blood pressure is higher than desired. -We have added a new medication to help lower your blood pressure.  CHRONIC KIDNEY DISEASE: Your kidney health requires careful management of your blood pressure and diabetes. -We have added a new medication to help manage your blood pressure and protect your kidneys.  THYROID  DISORDER: Your thyroid  levels need to be monitored. -We have ordered a thyroid  level test to check your thyroid  function.  Remember to bring all of the medications that you take (including over the counter medications and supplements) with you to every clinic visit.  This after visit summary is an important review of tests, referrals, and medication changes that were discussed during your visit. If you have questions or concerns, call [phone]. Outside of clinic business hours, call the main hospital at [phone] and ask the operator for the on-call internal medicine resident.   Ozell Kung MD 04/10/2024, 4:54 PM

## 2024-04-10 NOTE — Addendum Note (Signed)
 Addended by: Flordia Kassem F on: 04/10/2024 09:03 AM   Modules accepted: Orders

## 2024-04-10 NOTE — Assessment & Plan Note (Addendum)
 Lab Results  Component Value Date   LDLCALC 87 03/22/2024   LDLCALC 123 (H) 01/22/2024   LDLCALC 83 03/01/2023   Chronic, on high-intensity statin and ezetimibe . Decent control. Continue Crestor  10 mg daily and ezetimibe  10 mg daily.  Orders:   rosuvastatin  (CRESTOR ) 20 MG tablet; Take 1 tablet (20 mg total) by mouth daily after supper.

## 2024-04-10 NOTE — Progress Notes (Signed)
 Patient name: Megan Fischer Date of birth: 30-Sep-1950 Date of visit: 04/10/24  Subjective  Hospital follow-up: Patient was admitted to [facility] on 03/19/24 and discharged on 03/29/24. She was treated for euglycemic DKA, stroke, PE, and hyperthyroid. Treatment for this included insulin , anticoagulation, and methimazole  + cholestyramine . Telephone follow up was done on 04/08/24. She reports excellent compliance with treatment. She reports this condition is resolved. Discussed the use of AI scribe software for clinical note transcription with the patient, who gave verbal consent to proceed.  History of Present Illness   Megan Fischer is a 73 year old female with hypertension and diabetes who presents for follow-up after hospitalization. She is accompanied by her caregiver, who is her son.  Hypertension - Blood pressure elevated to 170/93 mmHg in clinic today. - Recent home blood pressure reading of 164/106 mmHg. - Currently managed with metoprolol .  Diabetes mellitus - Morning blood glucose levels typically range from 159 to 190 mg/dL. - Lowest recent blood glucose reading is 110 mg/dL. - Administers 10 units of insulin  daily, increased from 6 units due to persistent hyperglycemia above 130-140 mg/dL. - Blood sugar remains difficult to control.  Cerebrovascular disease - History of strokes. - Ozempic  was discontinued after her first stroke.       Outpatient Medications Prior to Visit  Medication Sig   acetaminophen  (TYLENOL ) 325 MG tablet Take 1-2 tablets (325-650 mg total) by mouth every 4 (four) hours as needed for mild pain (pain score 1-3).   apixaban  (ELIQUIS ) 5 MG TABS tablet Take 1 tablet (5 mg total) by mouth 2 (two) times daily.   aspirin  EC 81 MG tablet Take 1 tablet (81 mg total) by mouth daily. Swallow whole.   Blood Glucose Monitoring Suppl DEVI 1 each by Does not apply route as directed. Dispense based on patient and insurance preference. Use up to four times  daily as directed. (FOR ICD-10 E10.9, E11.9).   camphor-menthol  (SARNA) lotion Apply topically as needed for itching.   diclofenac  Sodium (VOLTAREN ) 1 % GEL Apply 2 g topically 4 (four) times daily. To both knees   ezetimibe  (ZETIA ) 10 MG tablet Take 1 tablet (10 mg total) by mouth daily.   ferrous sulfate  325 (65 FE) MG EC tablet Take 1 tablet (325 mg total) by mouth 2 (two) times daily. (Patient not taking: Reported on 03/18/2024)   Glucose Blood (BLOOD GLUCOSE TEST STRIPS) STRP 1 each by Does not apply route as directed. Dispense based on patient and insurance preference. Use up to four times daily as directed. (FOR ICD-10 E10.9, E11.9).   Insulin  Pen Needle 32G X 4 MM MISC Use daily at 6am with insulin  pen.   Lancet Device MISC 1 each by Does not apply route as directed. Dispense based on patient and insurance preference. Use up to four times daily as directed. (FOR ICD-10 E10.9, E11.9).   Lancets MISC 1 each by Does not apply route as directed. Dispense based on patient and insurance preference. Use up to four times daily as directed. (FOR ICD-10 E10.9, E11.9).   lidocaine  (LIDODERM ) 5 % Place 2 patches onto the skin daily. Remove & Discard patch within 12 hours or as directed by MD   melatonin 5 MG TABS Take 1 tablet (5 mg total) by mouth at bedtime as needed.   Multiple Vitamins-Minerals (MULTIVITAMIN WITH MINERALS) tablet Take 1 tablet by mouth daily.   polyethylene glycol (MIRALAX  / GLYCOLAX ) 17 g packet Take 17 g by mouth daily as needed for mild  constipation.   senna (SENOKOT) 8.6 MG TABS tablet Take 2 tablets (17.2 mg total) by mouth daily.   [DISCONTINUED] famotidine  (PEPCID ) 20 MG tablet Take 1 tablet (20 mg total) by mouth daily.   [DISCONTINUED] glimepiride  (AMARYL ) 1 MG tablet Take 1 mg by mouth daily with breakfast.   [DISCONTINUED] insulin  glargine (LANTUS ) 100 UNIT/ML Solostar Pen Inject 6 Units into the skin daily.   [DISCONTINUED] methimazole  (TAPAZOLE ) 10 MG tablet Take 1  tablet (10 mg total) by mouth daily.   [DISCONTINUED] metoprolol  tartrate (LOPRESSOR ) 25 MG tablet Take 1 tablet (25 mg total) by mouth 2 (two) times daily.   [DISCONTINUED] rosuvastatin  (CRESTOR ) 20 MG tablet Take 1 tablet (20 mg total) by mouth daily after supper.   No facility-administered medications prior to visit.     Objective  Today's Vitals   04/10/24 1602 04/10/24 1607  BP: (!) 180/150 (!) 171/93  Pulse: 82 84  Temp: 98.2 F (36.8 C)   TempSrc: Oral   SpO2: 98%   There is no height or weight on file to calculate BMI.   Physical Exam Constitutional:      Appearance: Normal appearance.  Cardiovascular:     Rate and Rhythm: Normal rate and regular rhythm.  Pulmonary:     Effort: Pulmonary effort is normal. No respiratory distress.  Skin:    General: Skin is warm and dry.  Neurological:     Mental Status: She is alert.     Cranial Nerves: No facial asymmetry.     Comments: Left arm pronator drift.  Psychiatric:        Mood and Affect: Affect normal.        Speech: Speech normal.        Behavior: Behavior normal.     Assessment & Plan Primary hypertension BP Readings from Last 3 Encounters:  04/10/24 (!) 171/93  04/01/24 135/62  03/29/24 124/62   Chronic, poor control today on metoprolol  tartrate 25 mg bid. With comorbid cerebrovascular disease and recent cerebral infarct, diabetes, and CKD 3b. Home measurements concordant with clinic measurement. Continue metoprolol  tartrate, start olmesartan  20 mg daily. BMP today and at follow-up.  Orders:   Basic metabolic panel with GFR   olmesartan  (BENICAR ) 20 MG tablet; Take 1 tablet (20 mg total) by mouth daily.   Basic metabolic panel with GFR; Future   metoprolol  tartrate (LOPRESSOR ) 25 MG tablet; Take 1 tablet (25 mg total) by mouth 2 (two) times daily.  DM (diabetes mellitus) type II controlled with renal manifestation (HCC) Lab Results  Component Value Date   HGBA1C 7.7 (H) 03/23/2024   HGBA1C 6.5 (H)  01/22/2024   HGBA1C 7.8 (H) 10/12/2023   Chronic, variable glycemic control. On 10 units glargine daily. No hypoglycemia. Fasting glucose routinely >120. Can increase glargine by 1 unit every few days for fasting glucose >120, aiming for a.m. fasting sugar of 70-120. Jardiance  was discontinued on discharge due to DKA and should not be restarted. Sulfonylurea could be restarted for better glycemic control. Semaglutide  should be restarted because of benefit profile given comorbid CKD and stroke, but son is hesitant as he attributes resumption to her recent decline.  Orders:   Glucose, capillary   Basic metabolic panel with GFR; Future   insulin  glargine (LANTUS ) 100 UNIT/ML Solostar Pen; Inject 10 Units into the skin daily.  Hypercholesterolemia Lab Results  Component Value Date   LDLCALC 87 03/22/2024   LDLCALC 123 (H) 01/22/2024   LDLCALC 83 03/01/2023   Chronic, on high-intensity statin  and ezetimibe . Decent control. Continue Crestor  10 mg daily and ezetimibe  10 mg daily.  Orders:   rosuvastatin  (CRESTOR ) 20 MG tablet; Take 1 tablet (20 mg total) by mouth daily after supper.  Subclinical hyperthyroidism Chronic, no signs of overt hyperthyroid. Check labs today. Continue methimazole  10 mg daily. Orders:   TSH   T4, free   methimazole  (TAPAZOLE ) 10 MG tablet; Take 1 tablet (10 mg total) by mouth daily.  Heartburn Refilled famotidine . Orders:   famotidine  (PEPCID ) 20 MG tablet; Take 1 tablet (20 mg total) by mouth daily.  Return in about 2 weeks (around 04/24/2024).  Ozell Kung MD 04/10/2024, 5:01 PM

## 2024-04-10 NOTE — Assessment & Plan Note (Addendum)
 BP Readings from Last 3 Encounters:  04/10/24 (!) 171/93  04/01/24 135/62  03/29/24 124/62   Chronic, poor control today on metoprolol  tartrate 25 mg bid. With comorbid cerebrovascular disease and recent cerebral infarct, diabetes, and CKD 3b. Home measurements concordant with clinic measurement. Continue metoprolol  tartrate, start olmesartan  20 mg daily. BMP today and at follow-up.  Orders:   Basic metabolic panel with GFR   olmesartan  (BENICAR ) 20 MG tablet; Take 1 tablet (20 mg total) by mouth daily.   Basic metabolic panel with GFR; Future   metoprolol  tartrate (LOPRESSOR ) 25 MG tablet; Take 1 tablet (25 mg total) by mouth 2 (two) times daily.

## 2024-04-11 ENCOUNTER — Ambulatory Visit: Payer: Self-pay | Admitting: Student

## 2024-04-11 ENCOUNTER — Ambulatory Visit: Admitting: Physician Assistant

## 2024-04-11 ENCOUNTER — Telehealth: Payer: Self-pay | Admitting: Cardiology

## 2024-04-11 DIAGNOSIS — I4821 Permanent atrial fibrillation: Secondary | ICD-10-CM

## 2024-04-11 DIAGNOSIS — I4891 Unspecified atrial fibrillation: Secondary | ICD-10-CM

## 2024-04-11 DIAGNOSIS — E8729 Other acidosis: Secondary | ICD-10-CM

## 2024-04-11 DIAGNOSIS — E782 Mixed hyperlipidemia: Secondary | ICD-10-CM

## 2024-04-11 DIAGNOSIS — I10 Essential (primary) hypertension: Secondary | ICD-10-CM

## 2024-04-11 DIAGNOSIS — D6869 Other thrombophilia: Secondary | ICD-10-CM

## 2024-04-11 DIAGNOSIS — R0789 Other chest pain: Secondary | ICD-10-CM

## 2024-04-11 LAB — BASIC METABOLIC PANEL WITH GFR
BUN/Creatinine Ratio: 37 — ABNORMAL HIGH (ref 12–28)
BUN: 50 mg/dL — ABNORMAL HIGH (ref 8–27)
CO2: 17 mmol/L — ABNORMAL LOW (ref 20–29)
Calcium: 9.6 mg/dL (ref 8.7–10.3)
Chloride: 111 mmol/L — ABNORMAL HIGH (ref 96–106)
Creatinine, Ser: 1.35 mg/dL — ABNORMAL HIGH (ref 0.57–1.00)
Glucose: 137 mg/dL — ABNORMAL HIGH (ref 70–99)
Potassium: 4.8 mmol/L (ref 3.5–5.2)
Sodium: 145 mmol/L — ABNORMAL HIGH (ref 134–144)
eGFR: 41 mL/min/1.73 — ABNORMAL LOW (ref >59)

## 2024-04-11 LAB — TSH: TSH: 0.323 u[IU]/mL — ABNORMAL LOW (ref 0.450–4.500)

## 2024-04-11 LAB — T4, FREE: Free T4: 1.44 ng/dL (ref 0.82–1.77)

## 2024-04-11 NOTE — Telephone Encounter (Signed)
 Returned call to Mohammed Ada, who is not on patient's most recent DPR. He states he is not with his mother and is also in a meeting so he cannot talk right now. He states he will call our office back later.   Patient was scheduled for post hospital f/u (s/p CVA) with T. Lucien PA-C today but missed the appointment. He states he has questions about his mother's medications but will call back to discuss.

## 2024-04-11 NOTE — Telephone Encounter (Signed)
  Pt c/o medication issue:  1. Name of Medication: heart medication   2. How are you currently taking this medication (dosage and times per day)?   3. Are you having a reaction (difficulty breathing--STAT)? No   4. What is your medication issue? Patient's son said they missed the patient's appointment today because they went to the hospital and by the time they were told that the appointment is at [facility]. Its already too late. They have questions about the patient's medication. He wasn't sure which heart medication the patient need to take

## 2024-04-15 ENCOUNTER — Other Ambulatory Visit: Payer: Self-pay | Admitting: Endocrinology

## 2024-04-15 DIAGNOSIS — E118 Type 2 diabetes mellitus with unspecified complications: Secondary | ICD-10-CM

## 2024-04-18 NOTE — Progress Notes (Signed)
 Internal Medicine Clinic Attending  Case discussed with the resident at the time of the visit.  We reviewed the resident's history and exam and pertinent patient test results.  I agree with the assessment, diagnosis, and plan of care documented in the resident's note.

## 2024-04-21 ENCOUNTER — Other Ambulatory Visit

## 2024-04-21 DIAGNOSIS — E8729 Other acidosis: Secondary | ICD-10-CM

## 2024-04-21 DIAGNOSIS — I10 Essential (primary) hypertension: Secondary | ICD-10-CM

## 2024-04-21 LAB — URINALYSIS, ROUTINE W REFLEX MICROSCOPIC
Bacteria, UA: NONE SEEN
Bilirubin Urine: NEGATIVE
Glucose, UA: NEGATIVE mg/dL
Hgb urine dipstick: NEGATIVE
Ketones, ur: NEGATIVE mg/dL
Nitrite: NEGATIVE
Protein, ur: 100 mg/dL — AB
Specific Gravity, Urine: 1.02 (ref 1.005–1.030)
pH: 5 (ref 5.0–8.0)

## 2024-04-21 LAB — BETA-HYDROXYBUTYRIC ACID: Beta-Hydroxybutyric Acid: 0.47 mmol/L — ABNORMAL HIGH (ref 0.05–0.27)

## 2024-04-21 LAB — BASIC METABOLIC PANEL WITH GFR
Anion gap: 9 (ref 5–15)
BUN: 28 mg/dL — ABNORMAL HIGH (ref 8–23)
CO2: 21 mmol/L — ABNORMAL LOW (ref 22–32)
Calcium: 8.9 mg/dL (ref 8.9–10.3)
Chloride: 111 mmol/L (ref 98–111)
Creatinine, Ser: 1.12 mg/dL — ABNORMAL HIGH (ref 0.44–1.00)
GFR, Estimated: 52 mL/min — ABNORMAL LOW (ref >60)
Glucose, Bld: 136 mg/dL — ABNORMAL HIGH (ref 70–99)
Potassium: 4 mmol/L (ref 3.5–5.1)
Sodium: 141 mmol/L (ref 135–145)

## 2024-04-21 LAB — LACTIC ACID, PLASMA: Lactic Acid, Venous: 1.1 mmol/L (ref 0.5–1.9)

## 2024-04-21 NOTE — Addendum Note (Signed)
 Addended by: ANTONE DWAYNE SAILOR on: 04/21/2024 01:58 PM   Modules accepted: Orders

## 2024-04-21 NOTE — Addendum Note (Signed)
 Addended by: ANTONE DWAYNE SAILOR on: 04/21/2024 01:57 PM   Modules accepted: Orders

## 2024-04-22 ENCOUNTER — Telehealth: Payer: Self-pay | Admitting: *Deleted

## 2024-04-22 ENCOUNTER — Ambulatory Visit: Payer: Self-pay

## 2024-04-22 NOTE — Telephone Encounter (Unsigned)
 Copied from CRM #[id number]. Topic: Clinical - Lab/Test Results >> Apr 22, 2024 12:23 PM Graeme ORN wrote: Reason for CRM: Patient called to check on lab results. Let patient know  results have not yet been notated by provider. Patient would like a call back with results once reviewed. Thank You

## 2024-04-23 ENCOUNTER — Ambulatory Visit: Payer: Self-pay | Admitting: Student

## 2024-04-23 NOTE — Progress Notes (Signed)
The patient was hospitalized. 

## 2024-04-29 ENCOUNTER — Ambulatory Visit: Payer: Self-pay

## 2024-04-29 ENCOUNTER — Other Ambulatory Visit: Payer: Self-pay | Admitting: Cardiology

## 2024-04-29 DIAGNOSIS — G8929 Other chronic pain: Secondary | ICD-10-CM

## 2024-04-29 DIAGNOSIS — E782 Mixed hyperlipidemia: Secondary | ICD-10-CM

## 2024-04-29 NOTE — Telephone Encounter (Signed)
 Pt has an appt 12/30 with Dr Tobie.

## 2024-04-29 NOTE — Telephone Encounter (Signed)
 FYI Only or Action Required?: Action required by provider: clinical question for provider and update on patient condition.  Patient was last seen in primary care on 04/10/2024 by Norrine Sharper, MD.  Called Nurse Triage reporting Back Pain and Shoulder Pain.  Symptoms began several weeks ago.  Interventions attempted: OTC medications: Voltaren  cream.  Symptoms are: gradually worsening.  Triage Disposition: See PCP When Office is Open (Within 3 Days)  Patient/caregiver understands and will follow disposition?: Yes            Copied from CRM #[id number]. Topic: Clinical - Red Word Triage >> Apr 29, 2024  4:02 PM Fredrica W wrote: Red Word that prompted transfer to Nurse Triage: Pain right shoulder and back for a week- no pain medication - bp rising due to pain level 190/115 yesterday Reason for Disposition  [1] MODERATE back pain (e.g., interferes with normal activities) AND [2] present > 3 days  Answer Assessment - Initial Assessment Questions 1. ONSET: When did the pain begin? (e.g., minutes, hours, days)      Ongoing for several weeks   2. LOCATION: Where does it hurt? (upper, mid or lower back)     Mid-lower back   3. SEVERITY: How bad is the pain?  (e.g., Scale 1-10; mild, moderate, or severe)     Moderate    4. PATTERN: Is the pain constant? (e.g., yes, no; constant, intermittent)      Intermittent at times to constant.     5. RADIATION: Does the pain shoot into your legs or somewhere else?     Right shoulder   6. CAUSE:  What do you think is causing the back pain?      Pt. Has hx, of arthritis   7. BACK OVERUSE:  Any recent lifting of heavy objects, strenuous work or exercise?     No   8. MEDICINES: What have you taken so far for the pain? (e.g., nothing, acetaminophen , NSAIDS)     Nothing   9. NEUROLOGIC SYMPTOMS: Do you have any weakness, numbness, or problems with bowel/bladder control?     No   10. OTHER SYMPTOMS: Do you have  any other symptoms? (e.g., fever, abdomen pain, burning with urination, blood in urine)       Shoulder pain-right side    Patient's daughter Florian called in to triage with complaints of back pain, shoulder pain in the patient. This has been ongoing for several weeks.   For home care, the patient is taking OTC Voltaren  cream, and heat. Gabapentin  was discontinued by provider.    Appointment availability is not until 12/30, and patient was scheduled.  Patients daughter is wanting the provider to prescribe pain medications in the mean time. Please advise. Daughter also stated she requested refills for the patients medications, over 5 days ago, and still have not been refilled.  Protocols used: Back Pain-A-AH

## 2024-04-30 ENCOUNTER — Other Ambulatory Visit: Payer: Self-pay

## 2024-04-30 ENCOUNTER — Other Ambulatory Visit: Payer: Self-pay | Admitting: Physical Medicine and Rehabilitation

## 2024-04-30 MED ORDER — TRAMADOL HCL 50 MG PO TABS: 50.0000 mg | ORAL_TABLET | Freq: Every evening | ORAL | 0 refills | Status: DC | PRN

## 2024-04-30 NOTE — Addendum Note (Signed)
 Addended by: NORRINE SHARPER on: 04/30/2024 12:10 PM   Modules accepted: Orders

## 2024-04-30 NOTE — Telephone Encounter (Signed)
 Pt 's son was here at the office; stated pt is in a lot of pain and needs a refill on Tramadol . Tramadol  has expired and dropped off pt's current med list.  I told him I will send request to the doctor.

## 2024-04-30 NOTE — Telephone Encounter (Signed)
 Ongoing chronic left shoulder pain keeping her up at nighttime occasionally.  May be a result of increased work with physical therapy recently.  Responded well to a short course of tramadol  at the end of October.  Will repeat this until her clinic appointment on 05/06/2024.  Meds ordered this encounter  Medications   traMADol  (ULTRAM ) 50 MG tablet    Sig: Take 1 tablet (50 mg total) by mouth at bedtime as needed for severe pain (pain score 7-10).    Dispense:  10 tablet    Refill:  0   Ozell Kung MD 04/30/2024, 12:03 PM

## 2024-04-30 NOTE — Addendum Note (Signed)
 Addended by: NORRINE SHARPER on: 04/30/2024 12:05 PM   Modules accepted: Orders

## 2024-04-30 NOTE — Addendum Note (Signed)
 Addended by: NORRINE SHARPER on: 04/30/2024 12:04 PM   Modules accepted: Orders

## 2024-05-02 ENCOUNTER — Other Ambulatory Visit: Payer: Self-pay

## 2024-05-06 ENCOUNTER — Encounter: Payer: Self-pay | Admitting: Student

## 2024-05-06 ENCOUNTER — Other Ambulatory Visit: Payer: Self-pay

## 2024-05-06 ENCOUNTER — Encounter: Payer: Self-pay | Admitting: Hematology

## 2024-05-06 ENCOUNTER — Ambulatory Visit: Payer: Self-pay | Admitting: Student

## 2024-05-06 VITALS — BP 163/108 | HR 98 | Temp 98.1°F | Ht 63.0 in | Wt 173.4 lb

## 2024-05-06 DIAGNOSIS — I10 Essential (primary) hypertension: Secondary | ICD-10-CM

## 2024-05-06 DIAGNOSIS — I4891 Unspecified atrial fibrillation: Secondary | ICD-10-CM

## 2024-05-06 DIAGNOSIS — E1129 Type 2 diabetes mellitus with other diabetic kidney complication: Secondary | ICD-10-CM

## 2024-05-06 DIAGNOSIS — R350 Frequency of micturition: Secondary | ICD-10-CM

## 2024-05-06 DIAGNOSIS — E782 Mixed hyperlipidemia: Secondary | ICD-10-CM

## 2024-05-06 DIAGNOSIS — Z7901 Long term (current) use of anticoagulants: Secondary | ICD-10-CM | POA: Diagnosis not present

## 2024-05-06 DIAGNOSIS — E0590 Thyrotoxicosis, unspecified without thyrotoxic crisis or storm: Secondary | ICD-10-CM

## 2024-05-06 DIAGNOSIS — E039 Hypothyroidism, unspecified: Secondary | ICD-10-CM

## 2024-05-06 DIAGNOSIS — G8929 Other chronic pain: Secondary | ICD-10-CM | POA: Diagnosis not present

## 2024-05-06 DIAGNOSIS — M25511 Pain in right shoulder: Secondary | ICD-10-CM

## 2024-05-06 LAB — POCT URINALYSIS DIPSTICK
Blood, UA: NEGATIVE
Glucose, UA: NEGATIVE
Ketones, UA: NEGATIVE
Leukocytes, UA: NEGATIVE
Nitrite, UA: NEGATIVE
Protein, UA: POSITIVE — AB
Spec Grav, UA: 1.02
Urobilinogen, UA: 0.2 U/dL
pH, UA: 5.5

## 2024-05-06 MED ORDER — METOPROLOL TARTRATE 25 MG PO TABS
25.0000 mg | ORAL_TABLET | Freq: Two times a day (BID) | ORAL | 6 refills | Status: DC
  Filled 2024-05-06: qty 60, 30d supply, fill #0

## 2024-05-06 MED ORDER — INSULIN GLARGINE 100 UNIT/ML SOLOSTAR PEN
11.0000 [IU] | PEN_INJECTOR | Freq: Every day | SUBCUTANEOUS | 3 refills | Status: AC
  Filled 2024-05-06: qty 9, 81d supply, fill #0

## 2024-05-06 MED ORDER — METHIMAZOLE 10 MG PO TABS
10.0000 mg | ORAL_TABLET | Freq: Every day | ORAL | 3 refills | Status: AC
  Filled 2024-05-06 – 2024-06-09 (×3): qty 90, 90d supply, fill #0

## 2024-05-06 MED ORDER — APIXABAN 5 MG PO TABS
5.0000 mg | ORAL_TABLET | Freq: Two times a day (BID) | ORAL | 3 refills | Status: AC
  Filled 2024-05-06: qty 90, 45d supply, fill #0

## 2024-05-06 MED ORDER — EZETIMIBE 10 MG PO TABS
10.0000 mg | ORAL_TABLET | Freq: Every day | ORAL | 3 refills | Status: AC
  Filled 2024-05-06: qty 90, 90d supply, fill #0

## 2024-05-06 MED ORDER — ASPIRIN 81 MG PO TBEC
81.0000 mg | DELAYED_RELEASE_TABLET | Freq: Every day | ORAL | 11 refills | Status: AC
  Filled 2024-05-06: qty 30, 30d supply, fill #0

## 2024-05-06 MED ORDER — OLMESARTAN MEDOXOMIL 20 MG PO TABS
20.0000 mg | ORAL_TABLET | Freq: Every day | ORAL | 6 refills | Status: DC
  Filled 2024-05-06: qty 30, 30d supply, fill #0

## 2024-05-06 NOTE — Assessment & Plan Note (Signed)
 Patient presents today with elevated blood pressures with systolics into the 160s.  She has run out of her Benicar .  I suspect this is why her blood pressure is elevated today.  She also has a component of pain.  Will obtain BMP today.  Instructed patient to take her Benicar .  Refills provided.  Plan: - Continue Benicar  20 mg daily - Continue Lopressor  25 mg twice daily - Patient instructed to keep a blood pressure log and follow-up in 1 month - Follow-up BMP

## 2024-05-06 NOTE — Assessment & Plan Note (Signed)
 Patient with past medical history of uncontrolled diabetes mellitus.  Patient is currently on Lantus  11 units daily.  Fasting sugars are measuring between 90s-120s.  Patient denies any concerns at this time.  She does endorse that her son stopped her Ozempic  because he felt it was not safe for her.  Counseled her on Ozempic  today.  Her and her son respectively declined Ozempic .  I removed this off her medication list today.  Previous A1c 7.7 on 03/23/2024.  Blood sugars are appropriately measuring at this time.  Will continue current treatment.  Plan: - A1c in February 2025 - After shared decision making Ozempic  stopped - Continue Lantus  11 units daily - Follow-up in 2 months

## 2024-05-06 NOTE — Progress Notes (Signed)
 "  CC: Right shoulder pain  HPI:  Megan Fischer is a 73 y.o. female with a past medical history of hypertension, prior CVA, osteoarthritis who presents for right shoulder pain and weakness.  Please see assessment and plan for full HPI.  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   Acute on chronic diastolic CHF (congestive heart failure) (HCC) 04/11/2021   Acute respiratory failure with hypoxia (HCC) 04/11/2021   AKI (acute kidney injury) 03/21/2024   Atrial fibrillation (HCC)    Chronic disease anemia 03/29/2011   Diabetes mellitus    Dyslipidemia    Euglycemic DKA (diabetic ketoacidosis) (HCC) 03/19/2024   Hypertension    Obesities, morbid (HCC)    Osteoarthritis    Pulmonary embolism (HCC) 03/21/2024   Stroke (HCC) 01/21/2024    Current Medications[1]  Review of Systems:    MSK: Patient endorses right shoulder pain  Physical Exam:  Vitals:   05/06/24 1558  BP: (!) 163/108  Pulse: 98  Temp: 98.1 F (36.7 C)  TempSrc: Oral  SpO2: 97%  Weight: 173 lb 6.4 oz (78.7 kg)  Height: 5' 3 (1.6 m)   General: Patient is sitting comfortably in the room  Cardio: Regular rate and rhythm, no murmurs, rubs or gallops Pulmonary: Clear to ausculation bilaterally with no rales, rhonchi, and crackles  MSK: 3/5 strength noted to right upper extremity on shoulder flexion actively and right shoulder abduction actively.  Full range of motion with pain on passive range of motion of right upper extremity.  Left upper extremity with weakness noted secondary to prior CVA.   Assessment & Plan:   Assessment & Plan Atrial fibrillation, unspecified type [facility]) Patient with past medical history of atrial fibrillation.  Patient is on Eliquis  5 mg twice daily.  No acute concerns or bleeding at the time.  Plan: - Continue Eliquis  5 mg twice daily, refills provided - Continue Metoprolol  tartrate 25 mg BID DM (diabetes mellitus) type II  controlled with renal manifestation (HCC) Patient with past medical history of uncontrolled diabetes mellitus.  Patient is currently on Lantus  11 units daily.  Fasting sugars are measuring between 90s-120s.  Patient denies any concerns at this time.  She does endorse that her son stopped her Ozempic  because he felt it was not safe for her.  Counseled her on Ozempic  today.  Her and her son respectively declined Ozempic .  I removed this off her medication list today.  Previous A1c 7.7 on 03/23/2024.  Blood sugars are appropriately measuring at this time.  Will continue current treatment.  Plan: - A1c in February 2025 - After shared decision making Ozempic  stopped - Continue Lantus  11 units daily - Follow-up in 2 months Mixed hyperlipidemia No acute concerns at this time.  Most recent lipid panel 1 month ago showing LDL 87, total cholesterol 844, triglycerides 108.  Given prior history of CVA, LDL goal would be 70.  Discussed this with patient today.  She states she has not been taking her rosuvastatin  stating that her son stated it was not good for her.  Counseled her on this today.  She states she will start taking her rosuvastatin .  Plan: - Encouraged taking rosuvastatin  20 mg daily - Follow-up lipid panel in 3-6 months, if LDL not at goal can increase rosuvastatin  - Continue Zetia  10 mg daily Subclinical hyperthyroidism No acute concerns at this time.  Patient is taking Tapazole   Plan: - Continue Tapazole  Primary hypertension Patient presents today with  elevated blood pressures with systolics into the 160s.  She has run out of her Benicar .  I suspect this is why her blood pressure is elevated today.  She also has a component of pain.  Will obtain BMP today.  Instructed patient to take her Benicar .  Refills provided.  Plan: - Continue Benicar  20 mg daily - Continue Lopressor  25 mg twice daily - Patient instructed to keep a blood pressure log and follow-up in 1 month - Follow-up  BMP  Urinary frequency Patient's son concerned for urinary frequency for patient.  They are concerned for an UTI.  UA looked unremarkable.  Patient does have some proteinuria.  Urinary frequency likely in the setting of hyperglycemia.  Strict glycemic control likely should improve this.  Plan: - Strict glycemic control Chronic right shoulder pain Patient endorses chronic right sided shoulder pain.  She reports this has been going on some September 2025 since she had been hospitalized.  She states that she has trouble lifting her arms above her head to comb her hair and to do activities of daily living.  She denies any left-sided pain,  but does have left-sided upper extremity weakness secondary to prior CVA.  On exam today, patient does not have any tenderness on palpation.  Patient does have full passive range of motion with pain.  Patient has 3/5 strength noted to shoulder abduction and shoulder flexion secondary to pain.  No sensation deficits.  Pulses intact.  Less likely adhesive capsulitis.  This is likely secondary to osteoarthritis.  Recommending Tylenol  1000 mg every 6 hours along with lidocaine  patch, Voltaren  gel.  Patient also instructed to use Biofreeze as well as Bengay cream.  Patient is due to start physical therapy next week which I anticipate will help.  Also instructed to do strengthening exercises which I demonstrated for patient today.  Plan: - Physical therapy starting next week - Tylenol  1000 mg every 6 hours - Lidocaine  patch - Voltaren  Gel - Bengay cream - Biofreeze - Follow-up in 1 month   Patient discussed with Dr. Lovie Libby Blanch, DO Internal Medicine Resident PGY-3     [1]  Current Outpatient Medications:    acetaminophen  (TYLENOL ) 325 MG tablet, Take 1-2 tablets (325-650 mg total) by mouth every 4 (four) hours as needed for mild pain (pain score 1-3)., Disp: , Rfl:    apixaban  (ELIQUIS ) 5 MG TABS tablet, Take 1 tablet (5 mg total) by mouth 2 (two) times  daily., Disp: 200 tablet, Rfl: 2   aspirin  EC 81 MG tablet, Take 1 tablet (81 mg total) by mouth daily. Swallow whole., Disp: 30 tablet, Rfl: 0   Blood Glucose Monitoring Suppl DEVI, 1 each by Does not apply route as directed. Dispense based on patient and insurance preference. Use up to four times daily as directed. (FOR ICD-10 E10.9, E11.9)., Disp: 1 each, Rfl: 0   camphor-menthol  (SARNA) lotion, Apply topically as needed for itching., Disp: 222 mL, Rfl: 0   diclofenac  Sodium (VOLTAREN ) 1 % GEL, Apply 2 g topically 4 (four) times daily. To both knees, Disp: 150 g, Rfl: 0   ezetimibe  (ZETIA ) 10 MG tablet, TAKE 1 TABLET BY MOUTH DAILY, Disp: 100 tablet, Rfl: 1   famotidine  (PEPCID ) 20 MG tablet, Take 1 tablet (20 mg total) by mouth daily., Disp: 30 tablet, Rfl: 3   Glucose Blood (BLOOD GLUCOSE TEST STRIPS) STRP, 1 each by Does not apply route as directed. Dispense based on patient and insurance preference. Use up to four times  daily as directed. (FOR ICD-10 E10.9, E11.9)., Disp: 100 strip, Rfl: 0   insulin  glargine (LANTUS ) 100 UNIT/ML Solostar Pen, Inject 10 Units into the skin daily., Disp: , Rfl:    Insulin  Pen Needle 32G X 4 MM MISC, Use daily at 6am with insulin  pen., Disp: 100 each, Rfl: 0   Lancet Device MISC, 1 each by Does not apply route as directed. Dispense based on patient and insurance preference. Use up to four times daily as directed. (FOR ICD-10 E10.9, E11.9)., Disp: 1 each, Rfl: 0   Lancets MISC, 1 each by Does not apply route as directed. Dispense based on patient and insurance preference. Use up to four times daily as directed. (FOR ICD-10 E10.9, E11.9)., Disp: 100 each, Rfl: 0   lidocaine  (LIDODERM ) 5 %, Place 2 patches onto the skin daily. Remove & Discard patch within 12 hours or as directed by MD, Disp: 60 patch, Rfl: 0   melatonin 5 MG TABS, Take 1 tablet (5 mg total) by mouth at bedtime as needed., Disp: , Rfl:    methimazole  (TAPAZOLE ) 10 MG tablet, Take 1 tablet (10 mg  total) by mouth daily., Disp: 30 tablet, Rfl: 3   metoprolol  tartrate (LOPRESSOR ) 25 MG tablet, Take 1 tablet (25 mg total) by mouth 2 (two) times daily., Disp: 60 tablet, Rfl: 3   Multiple Vitamins-Minerals (MULTIVITAMIN WITH MINERALS) tablet, Take 1 tablet by mouth daily., Disp: , Rfl:    olmesartan  (BENICAR ) 20 MG tablet, Take 1 tablet (20 mg total) by mouth daily., Disp: 30 tablet, Rfl: 2   OZEMPIC , 2 MG/DOSE, 8 MG/3ML SOPN, INJECT SUBCUTANEOUSLY 2 MG EVERY WEEK, Disp: 9 mL, Rfl: 3   polyethylene glycol (MIRALAX  / GLYCOLAX ) 17 g packet, Take 17 g by mouth daily as needed for mild constipation., Disp: 14 each, Rfl: 0   rosuvastatin  (CRESTOR ) 20 MG tablet, Take 1 tablet (20 mg total) by mouth daily after supper., Disp: 30 tablet, Rfl: 3   senna (SENOKOT) 8.6 MG TABS tablet, Take 2 tablets (17.2 mg total) by mouth daily., Disp: 120 tablet, Rfl: 0  "

## 2024-05-06 NOTE — Assessment & Plan Note (Signed)
 Patient with past medical history of atrial fibrillation.  Patient is on Eliquis  5 mg twice daily.  No acute concerns or bleeding at the time.  Plan: - Continue Eliquis  5 mg twice daily, refills provided - Continue Metoprolol  tartrate 25 mg BID

## 2024-05-06 NOTE — Patient Instructions (Addendum)
 Megan Fischer, Thank you for allowing me to take part in your care today.  Here are your instructions.  1. Please take all of your medications as prescribed. I have refilled all the medications that you needed refilled.  2. For your shoulder you can take 1000mg  of tylenol  4 times a day on a schedule. Please use Voltaren  gel. Please use a lidocaine  patch for the pain too. There are other topical medications such as bengay cream and biofreeze to help with the pain. You can also take tylenol  before starting physical therapy. Please use the milk jug idea that I showed you.   3. Your blood pressure was high today as you did not take your benicar  today. Please keep a Blood pressure log.   4. Please come back in one month. We can discuss your blood pressure at that time.   PLEASE BRING YOUR MEDICATIONS TO EVERY APPOINTMENT  Thank you, Dr. Tobie  If you have any other questions please contact the internal medicine clinic at [phone] If it is after hours, please call the [facility] at [phone] and then ask the person who picks up for the resident on call.

## 2024-05-06 NOTE — Assessment & Plan Note (Signed)
 No acute concerns at this time.  Patient is taking Tapazole   Plan: - Continue Tapazole 

## 2024-05-07 ENCOUNTER — Ambulatory Visit: Payer: Self-pay | Admitting: Student

## 2024-05-07 LAB — BASIC METABOLIC PANEL WITH GFR
BUN/Creatinine Ratio: 39 — ABNORMAL HIGH (ref 12–28)
BUN: 50 mg/dL — ABNORMAL HIGH (ref 8–27)
CO2: 20 mmol/L (ref 20–29)
Calcium: 10 mg/dL (ref 8.7–10.3)
Chloride: 106 mmol/L (ref 96–106)
Creatinine, Ser: 1.29 mg/dL — ABNORMAL HIGH (ref 0.57–1.00)
Glucose: 143 mg/dL — ABNORMAL HIGH (ref 70–99)
Potassium: 4.5 mmol/L (ref 3.5–5.2)
Sodium: 141 mmol/L (ref 134–144)
eGFR: 44 mL/min/1.73 — ABNORMAL LOW

## 2024-05-07 NOTE — Progress Notes (Signed)
 Internal Medicine Clinic Attending  Case discussed with the resident at the time of the visit.  We reviewed the resident's history and exam and pertinent patient test results.  I agree with the assessment, diagnosis, and plan of care documented in the resident's note.

## 2024-05-11 ENCOUNTER — Emergency Department (HOSPITAL_COMMUNITY)

## 2024-05-11 ENCOUNTER — Emergency Department (HOSPITAL_COMMUNITY)
Admission: EM | Admit: 2024-05-11 | Discharge: 2024-05-12 | Disposition: A | Attending: Emergency Medicine | Admitting: Emergency Medicine

## 2024-05-11 ENCOUNTER — Other Ambulatory Visit: Payer: Self-pay

## 2024-05-11 DIAGNOSIS — I4891 Unspecified atrial fibrillation: Secondary | ICD-10-CM | POA: Insufficient documentation

## 2024-05-11 DIAGNOSIS — Z7901 Long term (current) use of anticoagulants: Secondary | ICD-10-CM | POA: Insufficient documentation

## 2024-05-11 DIAGNOSIS — E119 Type 2 diabetes mellitus without complications: Secondary | ICD-10-CM | POA: Diagnosis not present

## 2024-05-11 DIAGNOSIS — I509 Heart failure, unspecified: Secondary | ICD-10-CM | POA: Diagnosis not present

## 2024-05-11 DIAGNOSIS — Z7982 Long term (current) use of aspirin: Secondary | ICD-10-CM | POA: Insufficient documentation

## 2024-05-11 DIAGNOSIS — I110 Hypertensive heart disease with heart failure: Secondary | ICD-10-CM | POA: Insufficient documentation

## 2024-05-11 DIAGNOSIS — R569 Unspecified convulsions: Secondary | ICD-10-CM | POA: Diagnosis present

## 2024-05-11 LAB — CBC WITH DIFFERENTIAL/PLATELET
Abs Immature Granulocytes: 0.02 K/uL (ref 0.00–0.07)
Basophils Absolute: 0 K/uL (ref 0.0–0.1)
Basophils Relative: 0 %
Eosinophils Absolute: 0 K/uL (ref 0.0–0.5)
Eosinophils Relative: 0 %
HCT: 40.8 % (ref 36.0–46.0)
Hemoglobin: 12.7 g/dL (ref 12.0–15.0)
Immature Granulocytes: 0 %
Lymphocytes Relative: 17 %
Lymphs Abs: 1.2 K/uL (ref 0.7–4.0)
MCH: 29.3 pg (ref 26.0–34.0)
MCHC: 31.1 g/dL (ref 30.0–36.0)
MCV: 94 fL (ref 80.0–100.0)
Monocytes Absolute: 0.3 K/uL (ref 0.1–1.0)
Monocytes Relative: 5 %
Neutro Abs: 5.6 K/uL (ref 1.7–7.7)
Neutrophils Relative %: 78 %
Platelets: 238 K/uL (ref 150–400)
RBC: 4.34 MIL/uL (ref 3.87–5.11)
RDW: 17.2 % — ABNORMAL HIGH (ref 11.5–15.5)
WBC: 7.3 K/uL (ref 4.0–10.5)
nRBC: 0 % (ref 0.0–0.2)

## 2024-05-11 LAB — COMPREHENSIVE METABOLIC PANEL WITH GFR
ALT: 27 U/L (ref 0–44)
AST: 38 U/L (ref 15–41)
Albumin: 3.8 g/dL (ref 3.5–5.0)
Alkaline Phosphatase: 93 U/L (ref 38–126)
Anion gap: 13 (ref 5–15)
BUN: 31 mg/dL — ABNORMAL HIGH (ref 8–23)
CO2: 21 mmol/L — ABNORMAL LOW (ref 22–32)
Calcium: 9.5 mg/dL (ref 8.9–10.3)
Chloride: 105 mmol/L (ref 98–111)
Creatinine, Ser: 1.09 mg/dL — ABNORMAL HIGH (ref 0.44–1.00)
GFR, Estimated: 53 mL/min — ABNORMAL LOW
Glucose, Bld: 105 mg/dL — ABNORMAL HIGH (ref 70–99)
Potassium: 4.4 mmol/L (ref 3.5–5.1)
Sodium: 138 mmol/L (ref 135–145)
Total Bilirubin: 0.6 mg/dL (ref 0.0–1.2)
Total Protein: 7.1 g/dL (ref 6.5–8.1)

## 2024-05-11 LAB — MAGNESIUM: Magnesium: 1.6 mg/dL — ABNORMAL LOW (ref 1.7–2.4)

## 2024-05-11 LAB — URINALYSIS, ROUTINE W REFLEX MICROSCOPIC
Bacteria, UA: NONE SEEN
Bilirubin Urine: NEGATIVE
Glucose, UA: NEGATIVE mg/dL
Hgb urine dipstick: NEGATIVE
Ketones, ur: NEGATIVE mg/dL
Leukocytes,Ua: NEGATIVE
Nitrite: NEGATIVE
Protein, ur: 100 mg/dL — AB
Specific Gravity, Urine: 1.018 (ref 1.005–1.030)
pH: 5 (ref 5.0–8.0)

## 2024-05-11 LAB — CBG MONITORING, ED: Glucose-Capillary: 102 mg/dL — ABNORMAL HIGH (ref 70–99)

## 2024-05-11 MED ORDER — LEVETIRACETAM (KEPPRA) 500 MG/5 ML ADULT IV PUSH
20.0000 mg/kg | Freq: Once | INTRAVENOUS | Status: AC
  Administered 2024-05-11: 1500 mg via INTRAVENOUS
  Filled 2024-05-11: qty 15

## 2024-05-11 MED ORDER — METOCLOPRAMIDE HCL 5 MG/ML IJ SOLN
10.0000 mg | Freq: Once | INTRAMUSCULAR | Status: AC
  Administered 2024-05-11: 10 mg via INTRAVENOUS
  Filled 2024-05-11: qty 2

## 2024-05-11 MED ORDER — LORAZEPAM 2 MG/ML IJ SOLN
1.0000 mg | Freq: Once | INTRAMUSCULAR | Status: AC
  Administered 2024-05-11: 1 mg via INTRAMUSCULAR
  Filled 2024-05-11: qty 1

## 2024-05-11 MED ORDER — SODIUM CHLORIDE 0.9 % IV BOLUS
500.0000 mL | Freq: Once | INTRAVENOUS | Status: AC
  Administered 2024-05-11: 500 mL via INTRAVENOUS

## 2024-05-11 MED ORDER — LEVETIRACETAM 500 MG PO TABS: 500.0000 mg | ORAL_TABLET | Freq: Two times a day (BID) | ORAL | 2 refills | Status: AC

## 2024-05-11 MED ORDER — HALOPERIDOL LACTATE 5 MG/ML IJ SOLN
2.0000 mg | Freq: Once | INTRAMUSCULAR | Status: AC
  Administered 2024-05-11: 2 mg via INTRAMUSCULAR
  Filled 2024-05-11: qty 1

## 2024-05-11 MED ORDER — MAGNESIUM OXIDE 400 MG PO TABS: 400.0000 mg | ORAL_TABLET | Freq: Two times a day (BID) | ORAL | 0 refills | Status: AC

## 2024-05-11 MED ORDER — HYDROCODONE-ACETAMINOPHEN 5-325 MG PO TABS: 2.0000 | ORAL_TABLET | Freq: Four times a day (QID) | ORAL | 0 refills | Status: AC | PRN

## 2024-05-11 MED ORDER — HYDROCODONE-ACETAMINOPHEN 5-325 MG PO TABS
1.0000 | ORAL_TABLET | Freq: Once | ORAL | Status: AC
  Administered 2024-05-11: 1 via ORAL
  Filled 2024-05-11: qty 1

## 2024-05-11 MED ORDER — LACTATED RINGERS IV BOLUS: 500.0000 mL | Freq: Once | INTRAVENOUS | Status: DC

## 2024-05-11 MED ORDER — MAGNESIUM SULFATE IN D5W 1-5 GM/100ML-% IV SOLN
1.0000 g | Freq: Once | INTRAVENOUS | Status: DC
  Filled 2024-05-11: qty 100

## 2024-05-11 MED ORDER — MAGNESIUM OXIDE -MG SUPPLEMENT 400 (240 MG) MG PO TABS
800.0000 mg | ORAL_TABLET | Freq: Once | ORAL | Status: AC
  Administered 2024-05-11: 800 mg via ORAL
  Filled 2024-05-11: qty 2

## 2024-05-11 MED ORDER — DIPHENHYDRAMINE HCL 50 MG/ML IJ SOLN
12.5000 mg | Freq: Once | INTRAMUSCULAR | Status: AC
  Administered 2024-05-11: 12.5 mg via INTRAVENOUS
  Filled 2024-05-11: qty 1

## 2024-05-11 NOTE — ED Notes (Signed)
 Patient was able to wake up and follow directions. Patient drank water  and took her medications by mouth. Urine sample obtained and sent to lab.

## 2024-05-11 NOTE — ED Notes (Signed)
 CCMD called by this RN

## 2024-05-11 NOTE — Progress Notes (Signed)
 There was a consult for placing PIV access. Patient was very agitated and not stay still for placing PIV access with 4 people. Unable to put in the PIV access at this time. HS [employer]

## 2024-05-11 NOTE — ED Notes (Signed)
 IV team consult placed STAT; this RN and Paramedic Bruno tried for IV access no success.

## 2024-05-11 NOTE — ED Provider Notes (Signed)
 " [place] [facility] AT [facility] Provider Note   CSN: [id number] Arrival date & time: 05/11/24  1215     Patient presents with: Seizures   Megan Fischer is a 74 y.o. female.    Seizures Patient presenting for seizure-like activity.  Medical history includes CVA, atrial fibrillation, DM, HLD, HTN, CHF.  She has had multiple admissions over the past several months for CVA.  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  She has no known seizure history.  Family reports that she had a seizure-like episode earlier today.  EMS was called.  When EMS arrived, patient was having a second episode.  It was described as left head deviation, eyes open, bilateral upper extremity shaking.  She did not respond at the time.  EMS witnessed 30 seconds of this activity.  After that, she did have quick return to mental baseline.  Patient denies any complaints currently.     Prior to Admission medications  Medication Sig Start Date End Date Taking? Authorizing Provider  levETIRAcetam  (KEPPRA ) 500 MG tablet Take 1 tablet (500 mg total) by mouth 2 (two) times daily. 05/11/24  Yes Melvenia Motto, MD  acetaminophen  (TYLENOL ) 325 MG tablet Take 1-2 tablets (325-650 mg total) by mouth every 4 (four) hours as needed for mild pain (pain score 1-3). 01/31/24   Love, Sharlet RAMAN, PA-C  apixaban  (ELIQUIS ) 5 MG TABS tablet Take 1 tablet (5 mg total) by mouth 2 (two) times daily. 05/06/24   Tobie Gaines, DO  aspirin  EC 81 MG tablet Take 1 tablet (81 mg total) by mouth daily. Swallow whole. 05/06/24   Tobie Gaines, DO  Blood Glucose Monitoring Suppl DEVI 1 each by Does not apply route as directed. Dispense based on patient and insurance preference. Use up to four times daily as directed. (FOR ICD-10 E10.9, E11.9). 02/28/24   Amilibia, Jaden, DO  camphor-menthol  [facility]) lotion Apply topically as needed for itching. 02/28/24   Amilibia, Jaden, DO  diclofenac  Sodium (VOLTAREN ) 1 % GEL Apply 2 g topically 4 (four)  times daily. To both knees 02/28/24   Amilibia, Jaden, DO  ezetimibe  (ZETIA ) 10 MG tablet Take 1 tablet (10 mg total) by mouth daily. 05/06/24   Tobie Gaines, DO  famotidine  (PEPCID ) 20 MG tablet Take 1 tablet (20 mg total) by mouth daily. 04/10/24   Norrine Sharper, MD  Glucose Blood (BLOOD GLUCOSE TEST STRIPS) STRP 1 each by Does not apply route as directed. Dispense based on patient and insurance preference. Use up to four times daily as directed. (FOR ICD-10 E10.9, E11.9). 02/28/24   Amilibia, Jaden, DO  insulin  glargine (LANTUS ) 100 UNIT/ML Solostar Pen Inject 11 Units into the skin daily. 05/06/24   Tobie Gaines, DO  Insulin  Pen Needle 32G X 4 MM MISC Use daily at 6am with insulin  pen. 04/01/24   Maurice Sharlet RAMAN, PA-C  Lancet Device MISC 1 each by Does not apply route as directed. Dispense based on patient and insurance preference. Use up to four times daily as directed. (FOR ICD-10 E10.9, E11.9). 02/28/24   Amilibia, Jaden, DO  Lancets MISC 1 each by Does not apply route as directed. Dispense based on patient and insurance preference. Use up to four times daily as directed. (FOR ICD-10 E10.9, E11.9). 02/28/24   Amilibia, Jaden, DO  lidocaine  (LIDODERM ) 5 % Place 2 patches onto the skin daily. Remove & Discard patch within 12 hours or as directed by MD 02/14/24   Love, Sharlet RAMAN, PA-C  melatonin  5 MG TABS Take 1 tablet (5 mg total) by mouth at bedtime as needed. 04/01/24   Love, Sharlet RAMAN, PA-C  methimazole  (TAPAZOLE ) 10 MG tablet Take 1 tablet (10 mg total) by mouth daily. 05/06/24   Tobie Gaines, DO  metoprolol  tartrate (LOPRESSOR ) 25 MG tablet Take 1 tablet (25 mg total) by mouth 2 (two) times daily. 05/06/24   Tobie Gaines, DO  Multiple Vitamins-Minerals (MULTIVITAMIN WITH MINERALS) tablet Take 1 tablet by mouth daily.    [provider]  olmesartan  (BENICAR ) 20 MG tablet Take 1 tablet (20 mg total) by mouth daily. 05/06/24   Tobie Gaines, DO  polyethylene glycol (MIRALAX  / GLYCOLAX ) 17 g  packet Take 17 g by mouth daily as needed for mild constipation. 03/29/24   Harrie Bruckner, DO  rosuvastatin  (CRESTOR ) 20 MG tablet Take 1 tablet (20 mg total) by mouth daily after supper. 04/10/24   Norrine Sharper, MD  senna (SENOKOT) 8.6 MG TABS tablet Take 2 tablets (17.2 mg total) by mouth daily. 04/01/24   Love, Sharlet RAMAN, PA-C    Allergies: Aspirin , Codeine, Fentanyl , Oxycodone hcl, Penicillins, and Chlorhexidine gluconate    Review of Systems  Neurological:  Positive for seizures.  All other systems reviewed and are negative.   Updated Vital Signs BP (!) 146/81   Pulse 68   Temp 98.6 F (37 C) (Oral)   Resp 17   Ht 5' 3 (1.6 m)   Wt 79 kg   SpO2 98%   BMI 30.85 kg/m   Physical Exam Vitals and nursing note reviewed.  Constitutional:      General: She is not in acute distress.    Appearance: Normal appearance. She is well-developed. She is not ill-appearing, toxic-appearing or diaphoretic.  HENT:     Head: Normocephalic and atraumatic.     Right Ear: External ear normal.     Left Ear: External ear normal.     Nose: Nose normal.     Mouth/Throat:     Mouth: Mucous membranes are moist.  Eyes:     Extraocular Movements: Extraocular movements intact.     Conjunctiva/sclera: Conjunctivae normal.  Cardiovascular:     Rate and Rhythm: Normal rate and regular rhythm.  Pulmonary:     Effort: Pulmonary effort is normal. No respiratory distress.  Abdominal:     General: There is no distension.     Palpations: Abdomen is soft.     Tenderness: There is no abdominal tenderness.  Musculoskeletal:        General: No swelling. Normal range of motion.     Cervical back: Normal range of motion and neck supple.  Skin:    General: Skin is warm and dry.     Coloration: Skin is not jaundiced or pale.  Neurological:     Mental Status: She is alert. Mental status is at baseline.  Psychiatric:        Mood and Affect: Mood normal.        Behavior: Behavior normal.      (all labs ordered are listed, but only abnormal results are displayed) Labs Reviewed  CBC WITH DIFFERENTIAL/PLATELET - Abnormal; Notable for the following components:      Result Value   RDW 17.2 (*)    All other components within normal limits  CBG MONITORING, ED - Abnormal; Notable for the following components:   Glucose-Capillary 102 (*)    All other components within normal limits  URINALYSIS, ROUTINE W REFLEX MICROSCOPIC  COMPREHENSIVE METABOLIC PANEL WITH GFR  MAGNESIUM     EKG: EKG Interpretation Date/Time:  Sunday May 11 2024 12:29:24 EST Ventricular Rate:  75 PR Interval:    QRS Duration:  90 QT Interval:  404 QTC Calculation: 451 R Axis:   -35  Text Interpretation: Undetermined rhythm Left axis deviation Minimal voltage criteria for LVH, may be normal variant ( R in aVL ) Septal infarct , age undetermined Abnormal ECG Confirmed by Melvenia Motto [phone]) on 05/11/2024 1:46:50 PM  Radiology: DG Chest 1 View Result Date: 05/11/2024 EXAM: 1 VIEW(S) XRAY OF THE CHEST 05/11/2024 01:20:00 PM COMPARISON: 03/20/2024 CLINICAL HISTORY: Seizure (HCC) FINDINGS: LUNGS AND PLEURA: Low lung volumes. No focal pulmonary opacity. No pleural effusion. No pneumothorax. HEART AND MEDIASTINUM: Aortic atherosclerosis. No acute abnormality of the cardiac and mediastinal silhouettes. BONES AND SOFT TISSUES: Surgical screw in right glenohumeral joint. No acute osseous abnormality. IMPRESSION: 1. No acute cardiopulmonary abnormality. Electronically signed by: Norman Gatlin MD 05/11/2024 02:03 PM EST RP Workstation: [id number]   CT HEAD WO CONTRAST Result Date: 05/11/2024 EXAM: CT HEAD WITHOUT 05/11/2024 01:48:03 PM TECHNIQUE: CT of the head was performed without the administration of intravenous contrast. Automated exposure control, iterative reconstruction, and/or weight based adjustment of the mA/kV was utilized to reduce the radiation dose to as low as reasonably achievable. COMPARISON: CT angio  head and MR head 03/21/2024. CLINICAL HISTORY: Seizure, new-onset, no history of trauma. FINDINGS: BRAIN AND VENTRICLES: Expected evolution of the inferior posterior right temporal and occipital lobe infarct is present. No acute intracranial hemorrhage. No acute infarct is present. No mass effect or midline shift. No extra-axial fluid collection. No hydrocephalus. ORBITS: No acute abnormality. SINUSES AND MASTOIDS: No acute abnormality. SOFT TISSUES AND SKULL: No acute skull fracture. No acute soft tissue abnormality. IMPRESSION: 1. No acute intracranial abnormality, including no acute infarct or hemorrhage. 2. Expected evolution of the inferior posterior right temporal and occipital lobe infarct. Electronically signed by: Lonni Necessary MD 05/11/2024 01:56 PM EST RP Workstation: [id number]     Procedures   Medications Ordered in the ED  levETIRAcetam  (KEPPRA ) undiluted injection 1,500 mg (1,500 mg Intravenous Given 05/11/24 1634)  metoCLOPramide  (REGLAN ) injection 10 mg (10 mg Intravenous Given 05/11/24 1654)  diphenhydrAMINE  (BENADRYL ) injection 12.5 mg (12.5 mg Intravenous Given 05/11/24 1654)  sodium chloride  0.9 % bolus 500 mL (0 mLs Intravenous Stopped 05/11/24 1729)  haloperidol  lactate (HALDOL ) injection 2 mg (2 mg Intramuscular Given 05/11/24 1828)  LORazepam  (ATIVAN ) injection 1 mg (1 mg Intramuscular Given 05/11/24 1828)  HYDROcodone -acetaminophen  (NORCO/VICODIN) 5-325 MG per tablet 1 tablet (1 tablet Oral Given 05/11/24 1955)                                    Medical Decision Making Amount and/or Complexity of Data Reviewed Labs: ordered. Radiology: ordered.  Risk Prescription drug management.   This patient presents to the ED for concern of seizure-like activity, this involves an extensive number of treatment options, and is a complaint that carries with it a high risk of complications and morbidity.  The differential diagnosis includes seizure, pseudoseizure, metabolic  derangements   Co morbidities / Chronic conditions that complicate the patient evaluation  CVA, atrial fibrillation, DM, HLD, HTN, CHF   Additional history obtained:  Additional history obtained from EMR External records from outside source obtained and reviewed including EMS, patient's son   Lab Tests:  I Ordered, and personally interpreted labs.  The pertinent results include: Normal hemoglobin,  no leukocytosis, remaining lab work pending at time of signout   Imaging Studies ordered:  I ordered imaging studies including chest x-ray, CT head I independently visualized and interpreted imaging which showed no acute findings.  CT scan shows expected evolution of prior strokes. I agree with the radiologist interpretation   Cardiac Monitoring: / EKG:  The patient was maintained on a cardiac monitor.  I personally viewed and interpreted the cardiac monitored which showed an underlying rhythm of: Sinus rhythm   Problem List / ED Course / Critical interventions / Medication management  Patient presenting for new onset seizure activity.  On arrival in the ED, she is awake and alert.  She has chronic left hemibody deficits.  She does not appear to have any new neurologic deficits.  CBG with EMS was in the range of 250.  Workup was initiated.  Given her history of recent strokes, she is at risk to develop seizures.  I spoke with neurologist on-call, Dr. Matthews, who recommends loading dose of Keppra  followed by ongoing Keppra , 500 mg twice daily and outpatient follow-up.  CT scan shows expected evolution of prior CVAs.  I spoke with patient's son who accompanied her at bedside.  He does describe similar episode as EMS did.  He was updated on plan.  While in the ED, patient endorsing headache.  Reglan  and Benadryl  were ordered.  Initial CMP hemolyzed.  Patient had agitation and removed her IV line.  Haldol  and Ativan  were ordered for anxiolysis.  Patient's son was concerned about his mother and  did not want any repeat attempts at lab draw.  He wanted to give her some time to rest prior to getting lab work and checking urine.  These tests were pending at time of signout.  Care of patient signed out to oncoming ED provider. I ordered medication including IV fluids for hydration, Keppra  for seizure prophylaxis; Benadryl  and Reglan  for headache; Haldol  and Ativan  for anxiolysis; Norco for analgesia Reevaluation of the patient after these medicines showed that the patient improved I have reviewed the patients home medicines and have made adjustments as needed   Consultations Obtained:  I requested consultation with the neurologist, Dr. Matthews,  and discussed lab and imaging findings as well as pertinent plan - they recommend: Initiate Keppra  twice daily, follow-up in neurology office   Social Determinants of Health:  Lives at home with family     Final diagnoses:  Seizure [facility])    ED Discharge Orders          Ordered    levETIRAcetam  (KEPPRA ) 500 MG tablet  2 times daily        05/11/24 2132               Melvenia Motto, MD 05/11/24 2135  "

## 2024-05-11 NOTE — ED Notes (Signed)
 MD Dixon at bedside with ultrasound IV

## 2024-05-11 NOTE — ED Notes (Signed)
 Placed safety mitts onto patient. She is resting at this time. Posey alarm set up and socks are on pt.

## 2024-05-11 NOTE — ED Provider Triage Note (Signed)
 Emergency Medicine Provider Triage Evaluation Note  Megan Fischer , a 74 y.o. female  was evaluated in triage.  Pt complains of seizure-like activity.  Review of Systems  Positive: Seizure-like activity Negative: Headache, chest pain, fever, chills  Physical Exam  BP (!) 178/95 (BP Location: Left Arm)   Pulse 92   Temp 98.5 F (36.9 C)   Resp 17   SpO2 95%  Gen:   Awake, no distress   Resp:  Normal effort  MSK:   Moves extremities without difficulty   Medical Decision Making  Medically screening exam initiated at 12:21 PM.  Appropriate orders placed.  Megan Fischer was informed that the remainder of the evaluation will be completed by another provider, this initial triage assessment does not replace that evaluation, and the importance of remaining in the ED until their evaluation is complete.  Patient presents for seizure-like activity.  Her medical history includes atrial fibrillation, DM, HTN, CVA.  She has had admissions in September and November of last year for CVA.  She does have chronic left-sided deficits.  This morning, she had witnessed seizure activity by family.  This resolved on its own and she had quick return to mental baseline.  She subsequently had an episode witnessed by EMS.  EMS describes as follows: Patient had left head deviation.  Eyes remained open but she did not respond.  She had shaking in bilateral upper extremities.  Episode lasted for approximately 30 seconds.  Again, she had rapid return to mental baseline.  She did not want to come to the hospital with EMS but was able to be talked into it.  She denies any complaints currently.  CBG prior to arrival was in the 250 range.   Melvenia Motto, MD 05/11/24 [phone]

## 2024-05-11 NOTE — ED Notes (Addendum)
 Walked into room. Patient is asleep in bed. Patients son remains at bedside. Placed a purewick on the patient in order to obtain a urine sample.

## 2024-05-11 NOTE — ED Provider Notes (Signed)
 I received the patient at signout from Dr. Melvenia.  Patient presents today with a seizure after recent CVA.  Case was discussed with neurology.  Recommended evaluation with labs, urinalysis, and chest x-ray.  The patient's labs did come back and her magnesium  is low.  She is ordered IV magnesium .  Still awaiting urinalysis results before discharge. Physical Exam  BP (!) 146/81   Pulse 85   Temp 98.6 F (37 C)   Resp 18   Ht 5' 3 (1.6 m)   Wt 79 kg   SpO2 99%   BMI 30.85 kg/m   Physical Exam  Procedures  Procedures  ED Course / MDM    Medical Decision Making Amount and/or Complexity of Data Reviewed Labs: ordered. Radiology: ordered.  Risk OTC drugs. Prescription drug management.   Urinalysis is negative and the patient was discharged.       Ula Prentice SAUNDERS, MD 05/11/24 [phone]

## 2024-05-11 NOTE — ED Notes (Signed)
CMP resent

## 2024-05-11 NOTE — ED Notes (Addendum)
 Son would like us  to allow her to rest. He said she is restless and agitated and resting help. He wants her to fall asleep and for us  to try again in 30 minutes.

## 2024-05-11 NOTE — Discharge Instructions (Addendum)
 A prescription for a new medication called levetiracetam  was sent to your pharmacy.  Take this twice daily to minimize chances of further seizures.  You should follow-up with your neurologist in the office.  If you have any return of seizures or any other new concerning symptoms, return to the emergency department.  Your magnesium  was a little low today.  Please follow-up with your doctor of this rechecked as an outpatient.

## 2024-05-11 NOTE — ED Triage Notes (Signed)
 Patient coming from home. EMS reports possible seizure like activity. She had multiple episodes of looking to the left with a tremor. She was not responsive during the tremor but after it stopped she was ale to answer questions to her baseline.   Stroke in September with left side deficits. History of diabetes

## 2024-05-11 NOTE — ED Notes (Signed)
 Pt wheeled to bathroom but defecated in pants and on toilet seat. Pt changed into paper scrub pants

## 2024-05-11 NOTE — ED Notes (Addendum)
 Pt daughter in law at bedside with this RN and Braulio, CHARITY [employer]DR[employer]ISER.  Pt son interfering with patient care at this time. This RN explained reason why IV is needed for patient and benefits and risks. Pt unable to sit still for IV team to successfully get access.  Pt removed previous IV placed by Jenna, RN with ultrasound.  This RN notified MD Melvenia.

## 2024-05-12 LAB — CBG MONITORING, ED: Glucose-Capillary: 100 mg/dL — ABNORMAL HIGH (ref 70–99)

## 2024-05-12 NOTE — Addendum Note (Signed)
 Addended by: NORRINE SHARPER on: 05/12/2024 09:52 AM   Modules accepted: Level of Service

## 2024-05-13 NOTE — Progress Notes (Signed)
 " Cardiology Office Note   Date:  05/15/2024  ID:  Megan Fischer, Megan Fischer 22-Oct-1950, MRN [id number] PCP: Bernadine Manos, MD  [facility] Providers Cardiologist:  Gordy Bergamo, MD    History of Present Illness Megan Fischer is a 74 y.o. female with a past medical history of hypertension, diabetes mellitus with stage IIIa chronic kidney disease, persistent atrial fibrillation, asymptomatic and on apixaban  5 mg p.o. twice a day who is here for follow-up appointment.  Was last seen 12/18/2023 and at that time she had experienced occasional chest pain that began on the left side and radiated to the upper abdomen.  The pain occurs infrequently and can happen while sitting or sleeping.  Notices bruising on her legs which she attributes to Eliquis .  No blood in her stool or melena.  Has experienced some weight loss and is uncertain of the cause.  Current medications include Ozempic , Eliquis  5 mg twice a day, diltiazem  360 mg once a day, benazepril  20 mg once daily, Lasix  40 mg once a day, and rosuvastatin  20 mg.  She has some limited mobility and uses a walker.  Her diet is inconsistent and often eating what ever she wants and sometimes skipping meals.  She cooks at home and rarely eats out.  Today, she presents with hx of atrial fibrillation and hypertension for medication review and management. She is accompanied by her son.  She is taking Eliquis  twice daily, diltiazem  360 mg once daily, and metoprolol  25 mg twice daily for atrial fibrillation. There is a discrepancy in metoprolol  dosing, previously listed as 50 mg once daily. For hypertension, she changed from benazepril  20 mg to olmesartan  20 mg. She stopped Lasix  40 mg daily, which she had used for fluid management.  She has leg swelling that has improved over the past few months and she can now wear shoes more comfortably. Her weight has increased from 165 lbs to 172 lbs while on a no-salt diet. Her son has been doing more cooking. This charge has  been since her hospital visit.   She had a prior stroke with residual left-sided weakness. She has confusion and a recent seizure 3 to 4 days ago, and she is waiting for an earlier neurology evaluation.  Her diabetes regimen includes Lantus  11 units daily with additional insulin  as needed. Her A1c was 7.7 in November. She checks blood glucose with fingersticks.  She uses a digital blood pressure cuff at home but reports inconsistent readings.  Reports no chest pain, pressure, or tightness. No orthopnea, PND. Reports no palpitations.   Discussed the use of AI scribe software for clinical note transcription with the patient, who gave verbal consent to proceed.   ROS: pertinent ROS in HPI  Studies Reviewed     Carotid vascular ultrasound 03/24/2024  Examination Guidelines: A complete evaluation includes B-mode imaging,  spectral  Doppler, color Doppler, and power Doppler as needed of all accessible  portions  of each vessel. Bilateral testing is considered an integral part of a  complete  examination. Limited examinations for reoccurring indications may be  performed  as noted.     Right Carotid Findings:  +----------+-------+--------+--------+---------------------+---------------  ----+           PSV    EDV cm/sStenosisPlaque Description   Comments                        cm/s                                                              +----------+-------+--------+--------+---------------------+---------------  ----+  CCA Prox  42     8               smooth and                                                                  heterogenous                               +----------+-------+--------+--------+---------------------+---------------  ----+  CCA Distal29     5               smooth and                                                                  heterogenous                                +----------+-------+--------+--------+---------------------+---------------  ----+  ICA Prox  21     0                                    High resistant  flow  +----------+-------+--------+--------+---------------------+---------------  ----+  ICA Mid   26     5                                    High resistant  flow  +----------+-------+--------+--------+---------------------+---------------  ----+  ICA Distal12     0                                    Highl  resistant                                                            flow                  +----------+-------+--------+--------+---------------------+---------------  ----+  ECA      90     16                                                         +----------+-------+--------+--------+---------------------+---------------  ----+   +----------+--------+-------+--------+-------------------+           PSV cm/sEDV cmsDescribeArm Pressure (mmHG)  +----------+--------+-------+--------+-------------------+  [id number]                                         +----------+--------+-------+--------+-------------------+   +---------+--------+--+--------+-+---------+  VertebralPSV cm/s37EDV cm/s9Antegrade  +---------+--------+--+--------+-+---------+      Left Carotid Findings:  +----------+--------+--------+--------+-----------------------+--------+           PSV cm/sEDV cm/sStenosisPlaque Description     Comments  +----------+--------+--------+--------+-----------------------+--------+  CCA Prox  64      15              smooth and heterogenous          +----------+--------+--------+--------+-----------------------+--------+  CCA Distal57      17              smooth and heterogenous          +----------+--------+--------+--------+-----------------------+--------+  ICA Prox  48      20              smooth and heterogenous           +----------+--------+--------+--------+-----------------------+--------+  ICA Mid   54      22                                               +----------+--------+--------+--------+-----------------------+--------+  ICA Distal59      21                                     tortuous  +----------+--------+--------+--------+-----------------------+--------+  ECA      64      9                                                +----------+--------+--------+--------+-----------------------+--------+   +----------+--------+--------+--------+-------------------+           PSV cm/sEDV cm/sDescribeArm Pressure (mmHG)  +----------+--------+--------+--------+-------------------+  Subclavian109                                         +----------+--------+--------+--------+-------------------+   +---------+--------+--+--------+--+---------+  VertebralPSV cm/s51EDV cm/s10Antegrade  +---------+--------+--+--------+--+---------+         Summary:  Right Carotid: High resistant flow detected in the ICA suggests distal                 stenosis/occlusion.   Left Carotid: Velocities in the left ICA are consistent with a 1-39%  stenosis.   Vertebrals: Bilateral vertebral arteries demonstrate antegrade flow.   *See table(s) above for measurements and observations.      Electronically signed by Eather Popp MD on 03/24/2024 at 1:26:55 PM.        Final    Risk Assessment/Calculations  CHA2DS2-VASc Score = 7   This indicates a 11.2% annual risk of stroke. The patient's score is based upon: CHF History: 1 HTN History: 1 Diabetes History: 1 Stroke History: 2 Vascular Disease History: 0 Age Score: 1 Gender Score: 1        Physical Exam VS:  BP (!) 154/93   Pulse 78   Ht 5' 3 (1.6 m)   Wt 170 lb (77.1 kg)   SpO2 98%   BMI 30.11 kg/m        Wt Readings from Last 3 Encounters:  05/15/24 170 lb (77.1 kg)  05/11/24 174 lb 2.6 oz (79 kg)  05/06/24 173 lb 6.4 oz (78.7 kg)    GEN: Well nourished, well developed in no acute distress NECK: No JVD; No carotid bruits CARDIAC: IRIR, no murmurs, rubs, gallops RESPIRATORY:  Clear to auscultation without rales, wheezing or rhonchi  ABDOMEN: Soft, non-tender, non-distended EXTREMITIES:  L > R LE edema; No deformity   ASSESSMENT AND PLAN  Atrial fibrillation-Chronic, rate controlled Managed with Eliquis , diltiazem , and metoprolol . No new issues reported. - Continue Eliquis , diltiazem , and metoprolol  as prescribed.  Primary hypertension Blood pressure management ongoing with olmesartan . Recent weight gain noted. Blood pressure today 148/82 mmHg. Discussed potential olmesartan  dosage adjustment and HCTZ addition for fluid management. - Monitor blood pressure at home using an Omron brand cuff. - Track blood pressure an hour after morning medications for 1-2 weeks. - Increase olmesartan  to 40 mg - Consider adding HCTZ if fluid management becomes necessary.  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 Recent A1c 7.7%, slightly above target. Discussed potential use of Freestyle Libre glucose monitor. - Continue Lantus  and insulin  pen as prescribed. - Discuss Freestyle Libre glucose monitor with primary care provider for easier glucose monitoring.  Fluid overload/HFmrEF -She has increased in weight from 165 lbs to 170.  -She is reportedly on a low sodium diet -she is off her daily lasix  but has enough to use PRN -L > R LE edema which is her usual -offered hydrochlorothiazide  addition to her sartan if needed for HTN and volume overload. -daily weights and if continues to increase, call for medication adjustment -elevate legs when able and use compression when able      Dispo: She can return in 3-4 months for MD follow-up.   Signed, Orren LOISE Fabry, PA-C   "

## 2024-05-14 ENCOUNTER — Emergency Department (HOSPITAL_COMMUNITY)
Admission: EM | Admit: 2024-05-14 | Discharge: 2024-05-14 | Disposition: A | Discharge status: Home / Self Care | Attending: Emergency Medicine | Admitting: Emergency Medicine

## 2024-05-14 ENCOUNTER — Other Ambulatory Visit: Payer: Self-pay

## 2024-05-14 ENCOUNTER — Encounter (HOSPITAL_COMMUNITY): Payer: Self-pay | Admitting: Pharmacy Technician

## 2024-05-14 DIAGNOSIS — E119 Type 2 diabetes mellitus without complications: Secondary | ICD-10-CM | POA: Insufficient documentation

## 2024-05-14 DIAGNOSIS — Z7982 Long term (current) use of aspirin: Secondary | ICD-10-CM | POA: Diagnosis not present

## 2024-05-14 DIAGNOSIS — R110 Nausea: Secondary | ICD-10-CM | POA: Diagnosis present

## 2024-05-14 DIAGNOSIS — I10 Essential (primary) hypertension: Secondary | ICD-10-CM | POA: Insufficient documentation

## 2024-05-14 DIAGNOSIS — Z7901 Long term (current) use of anticoagulants: Secondary | ICD-10-CM | POA: Insufficient documentation

## 2024-05-14 DIAGNOSIS — R1013 Epigastric pain: Secondary | ICD-10-CM | POA: Insufficient documentation

## 2024-05-14 DIAGNOSIS — Z794 Long term (current) use of insulin: Secondary | ICD-10-CM | POA: Diagnosis not present

## 2024-05-14 DIAGNOSIS — Z79899 Other long term (current) drug therapy: Secondary | ICD-10-CM | POA: Diagnosis not present

## 2024-05-14 DIAGNOSIS — Z8673 Personal history of transient ischemic attack (TIA), and cerebral infarction without residual deficits: Secondary | ICD-10-CM | POA: Diagnosis not present

## 2024-05-14 NOTE — Discharge Instructions (Signed)
 Megan Fischer Megan Fischer  Thank you for allowing us  to take care of you today.  You came to the Emergency Department today because you usually take your morning medications after breakfast, however today you took them on a empty stomach and this made you feel nauseated, however you ate a banana and some pudding and now are feeling back to usual self.  You do not have any nausea, pain, you do not hurt when we press on your belly, you have no other symptoms, thus it is unlikely that your symptoms were caused by an emergency condition, most likely they were being caused by you eating on an empty stomach.  We talked about getting labs and imaging, however you would not like to do this at this time, rather you would like to go home and do usual process of eating before you take your pills, and you will come back if you have any recurrence of your symptoms.  Otherwise your safe to follow-up with your primary care doctor.  To-Do: 1. Please follow-up with your primary doctor within 1 - 2 weeks / as soon as possible.   Please return to the Emergency Department or call 911 if you experience have worsening of your symptoms, or do not get better, chest pain, shortness of breath, severe or significantly worsening pain, high fever, severe confusion, pass out or have any reason to think that you need emergency medical care.   We hope you feel better soon.   Mitzie Later, MD Department of Emergency Medicine [facility] [place]

## 2024-05-14 NOTE — ED Triage Notes (Addendum)
 Pt bib ems from home with reports of taking medication on an empty stomach, developed some nausea. Reports she normally takes meds after eating breakfast. Nausea resolved after eating. VSS with ems.

## 2024-05-14 NOTE — ED Notes (Signed)
 Pt's son advised he was approximately 30 minutes away

## 2024-05-14 NOTE — ED Provider Notes (Signed)
 " [place] EMERGENCY DEPARTMENT AT [facility] Provider Note   CSN: [id number] Arrival date & time: 05/14/24  9061     History No chief complaint on file.   HPI: Megan Fischer is a 74 y.o. female with history pertinent for atrial fibrillation, HTN, DM, HDL, anemia of chronic disease, pulmonary hypertension, prior stroke who presents complaining of nausea. Patient arrived via EMS from home.  History provided by patient.  No interpreter required during this encounter.  Patient reports that usually she takes her medications after breakfast.  Reports that she decided to take her home medications prior to breakfast today, and thereafter developed some nausea.  Denies any associated vomiting, abdominal pain, fevers, chills, chest pain, shortness of breath.  Reports that she ate a banana and pudding, and nausea resolved, but by that time EMS had already arrived, therefore she decided to come to the emergency department, however states that she has not had any nausea since eating the pudding and banana, thus that she would like to go back home.  Spoke to patient's son, Breslyn Abdo, over the phone who confirms above history.  Patient's recorded medical, surgical, social, medication list and allergies were reviewed in the Snapshot window as part of the initial history.   Prior to Admission medications  Medication Sig Start Date End Date Taking? Authorizing Provider  acetaminophen  (TYLENOL ) 325 MG tablet Take 1-2 tablets (325-650 mg total) by mouth every 4 (four) hours as needed for mild pain (pain score 1-3). 01/31/24   Love, Sharlet RAMAN, PA-C  apixaban  (ELIQUIS ) 5 MG TABS tablet Take 1 tablet (5 mg total) by mouth 2 (two) times daily. 05/06/24   Tobie Gaines, DO  aspirin  EC 81 MG tablet Take 1 tablet (81 mg total) by mouth daily. Swallow whole. 05/06/24   Tobie Gaines, DO  Blood Glucose Monitoring Suppl DEVI 1 each by Does not apply route as directed. Dispense based on patient and insurance  preference. Use up to four times daily as directed. (FOR ICD-10 E10.9, E11.9). 02/28/24   Amilibia, Jaden, DO  camphor-menthol  [facility]) lotion Apply topically as needed for itching. 02/28/24   Amilibia, Jaden, DO  diclofenac  Sodium (VOLTAREN ) 1 % GEL Apply 2 g topically 4 (four) times daily. To both knees 02/28/24   Amilibia, Jaden, DO  ezetimibe  (ZETIA ) 10 MG tablet Take 1 tablet (10 mg total) by mouth daily. 05/06/24   Tobie Gaines, DO  famotidine  (PEPCID ) 20 MG tablet Take 1 tablet (20 mg total) by mouth daily. 04/10/24   Norrine Sharper, MD  Glucose Blood (BLOOD GLUCOSE TEST STRIPS) STRP 1 each by Does not apply route as directed. Dispense based on patient and insurance preference. Use up to four times daily as directed. (FOR ICD-10 E10.9, E11.9). 02/28/24   Amilibia, Jaden, DO  HYDROcodone -acetaminophen  (NORCO/VICODIN) 5-325 MG tablet Take 2 tablets by mouth every 6 (six) hours as needed for moderate pain (pain score 4-6) or severe pain (pain score 7-10). 05/11/24   Ula Prentice SAUNDERS, MD  insulin  glargine (LANTUS ) 100 UNIT/ML Solostar Pen Inject 11 Units into the skin daily. 05/06/24   Tobie Gaines, DO  Insulin  Pen Needle 32G X 4 MM MISC Use daily at 6am with insulin  pen. 04/01/24   Maurice Sharlet RAMAN, PA-C  Lancet Device MISC 1 each by Does not apply route as directed. Dispense based on patient and insurance preference. Use up to four times daily as directed. (FOR ICD-10 E10.9, E11.9). 02/28/24   Amilibia, Jaden, DO  Lancets MISC 1  each by Does not apply route as directed. Dispense based on patient and insurance preference. Use up to four times daily as directed. (FOR ICD-10 E10.9, E11.9). 02/28/24   Amilibia, Jaden, DO  levETIRAcetam  (KEPPRA ) 500 MG tablet Take 1 tablet (500 mg total) by mouth 2 (two) times daily. 05/11/24   Melvenia Motto, MD  lidocaine  (LIDODERM ) 5 % Place 2 patches onto the skin daily. Remove & Discard patch within 12 hours or as directed by MD 02/14/24   Love, Sharlet RAMAN, PA-C  magnesium  oxide  (MAG-OX) 400 MG tablet Take 1 tablet (400 mg total) by mouth 2 (two) times daily. 05/11/24   Ula Prentice SAUNDERS, MD  melatonin 5 MG TABS Take 1 tablet (5 mg total) by mouth at bedtime as needed. 04/01/24   Love, Sharlet RAMAN, PA-C  methimazole  (TAPAZOLE ) 10 MG tablet Take 1 tablet (10 mg total) by mouth daily. 05/06/24   Tobie Gaines, DO  metoprolol  tartrate (LOPRESSOR ) 25 MG tablet Take 1 tablet (25 mg total) by mouth 2 (two) times daily. 05/06/24   Tobie Gaines, DO  Multiple Vitamins-Minerals (MULTIVITAMIN WITH MINERALS) tablet Take 1 tablet by mouth daily.    [provider]  olmesartan  (BENICAR ) 20 MG tablet Take 1 tablet (20 mg total) by mouth daily. 05/06/24   Tobie Gaines, DO  polyethylene glycol (MIRALAX  / GLYCOLAX ) 17 g packet Take 17 g by mouth daily as needed for mild constipation. 03/29/24   Harrie Bruckner, DO  rosuvastatin  (CRESTOR ) 20 MG tablet Take 1 tablet (20 mg total) by mouth daily after supper. 04/10/24   Norrine Sharper, MD  senna (SENOKOT) 8.6 MG TABS tablet Take 2 tablets (17.2 mg total) by mouth daily. 04/01/24   Love, Sharlet RAMAN, PA-C     Allergies: Aspirin , Codeine, Fentanyl , Oxycodone hcl, Penicillins, and Chlorhexidine gluconate   Review of Systems   ROS as per HPI  Physical Exam Updated Vital Signs BP (!) 176/125 (BP Location: Left Arm)   Pulse 71   Temp 98.2 F (36.8 C) (Oral)   Resp 18   SpO2 100%  Physical Exam Vitals and nursing note reviewed.  Constitutional:      General: She is not in acute distress.    Appearance: She is well-developed.  HENT:     Head: Normocephalic and atraumatic.  Eyes:     Conjunctiva/sclera: Conjunctivae normal.  Cardiovascular:     Rate and Rhythm: Normal rate and regular rhythm.     Heart sounds: No murmur heard. Pulmonary:     Effort: Pulmonary effort is normal. No respiratory distress.     Breath sounds: Normal breath sounds.  Abdominal:     Palpations: Abdomen is soft.     Tenderness: There is no abdominal  tenderness.  Musculoskeletal:        General: No swelling.     Cervical back: Neck supple.  Skin:    General: Skin is warm and dry.     Capillary Refill: Capillary refill takes less than 2 seconds.  Neurological:     Mental Status: She is alert.  Psychiatric:        Mood and Affect: Mood normal.     ED Course/ Medical Decision Making/ A&P    Procedures Procedures   Medications Ordered in ED Medications - No data to display  Medical Decision Making:   YUKARI FLAX is a 74 y.o. female who presents for nausea as per above.  Physical exam is pertinent for no focal abnormalities.   The differential includes  but is not limited to gastritis, dyspepsia, electrolyte derangement, pancreatitis, bowel obstruction.  Independent historian: None  External data reviewed: No pertinent external data  Labs: Not indicated  Radiology: Not indicated  EKG/Medicine tests: Not indicated EKG Interpretation:                  Interventions: None  See the EMR for full details regarding lab and imaging results.  Patient presents emergency for result evaluation, which resolved after eating food at home.  Patient reports that she has no associated symptoms, typically takes her medications on a full stomach, is now back to baseline after eating, has no current complaints, no associated symptoms, reassuring physical exam including no tenderness to palpation, breath I have a low index of suspicion for acute intra-abdominal process, such as pancreatitis, bowel obstruction overall feel that labs would likely be of low yield at this time, discussed risks and benefits of obtaining labs and imaging with patient as well as son, Rosealee, over the phone, and given she is back to baseline they would like to defer labs at this time, continue to manage at home, with plan to eat prior to taking her medications as she typically does, and if she has any redevelopment of symptoms or worsening symptoms, come back to the  emergency department for further evaluation.  Presentation is most consistent with dyspepsia due to taking medications on an empty stomach.  Presentation is most consistent with acute uncomplicated illness  Discussion of management or test interpretations with external provider(s): Not indicated  Risk Drugs:None  Disposition: DISCHARGE: I believe that the patient is safe for discharge home with outpatient follow-up. Patient was informed of all pertinent physical exam, laboratory, and imaging findings.  Patient's suspected etiology of their symptom presentation was discussed with the patient and all questions were answered. We discussed following up with PCP. I provided thorough ED return precautions. The patient feels safe and comfortable with this plan.  MDM generated using voice dictation software and may contain dictation errors.  Please contact me for any clarification or with any questions.  Clinical Impression:  1. Dyspepsia      Discharge   Final Clinical Impression(s) / ED Diagnoses Final diagnoses:  Dyspepsia    Rx / DC Orders ED Discharge Orders     None        Rogelia Jerilynn RAMAN, MD 05/14/24 1048  "

## 2024-05-15 ENCOUNTER — Telehealth: Payer: Self-pay

## 2024-05-15 ENCOUNTER — Ambulatory Visit: Attending: Physician Assistant | Admitting: Physician Assistant

## 2024-05-15 ENCOUNTER — Other Ambulatory Visit (HOSPITAL_COMMUNITY): Payer: Self-pay

## 2024-05-15 ENCOUNTER — Encounter: Payer: Self-pay | Admitting: Hematology

## 2024-05-15 VITALS — BP 154/93 | HR 78 | Ht 63.0 in | Wt 170.0 lb

## 2024-05-15 DIAGNOSIS — E8770 Fluid overload, unspecified: Secondary | ICD-10-CM

## 2024-05-15 DIAGNOSIS — I4891 Unspecified atrial fibrillation: Secondary | ICD-10-CM

## 2024-05-15 DIAGNOSIS — I10 Essential (primary) hypertension: Secondary | ICD-10-CM | POA: Diagnosis not present

## 2024-05-15 DIAGNOSIS — R0789 Other chest pain: Secondary | ICD-10-CM | POA: Diagnosis not present

## 2024-05-15 DIAGNOSIS — E1129 Type 2 diabetes mellitus with other diabetic kidney complication: Secondary | ICD-10-CM | POA: Diagnosis not present

## 2024-05-15 DIAGNOSIS — E782 Mixed hyperlipidemia: Secondary | ICD-10-CM

## 2024-05-15 MED ORDER — OLMESARTAN MEDOXOMIL 40 MG PO TABS: 40.0000 mg | ORAL_TABLET | Freq: Every day | ORAL | 2 refills | Status: DC

## 2024-05-15 MED ORDER — OMRON 3 SERIES BP MONITOR DEVI
1.0000 | Freq: Every day | 0 refills | Status: DC
  Filled 2024-05-15: qty 1, 30d supply, fill #0

## 2024-05-15 NOTE — Telephone Encounter (Signed)
 Message has been forwarded to triage nurse and PCP.   Copied from CRM #[id number]. Topic: Clinical - Medication Question >> May 15, 2024  2:19 PM DeAngela L wrote: Reason for CRM: patient son Darilyn is asking if the doctor can give him a call he spoke the doctor before thanksgiving and he has additional questions now  Central num [phone]

## 2024-05-15 NOTE — Telephone Encounter (Signed)
 Return call to pt's son Megan Fischer for additional information for his call - no answer; left message of office's return call.

## 2024-05-15 NOTE — Patient Instructions (Signed)
 Medication Instructions:  INCREASE Benicar  (olmesartan ) to 40mg  Take 1 tablet once a day Free Style Libre glucose monitor -ask your pcp about   A prescription for a blood pressure cuff has been sent to the pharmacy downstairs. Please stop by to pick it up. The cost is approximately $35, though some insurances plans may cover it. Be sure to use the cuff regularly to monitor blood pressure as recommended by your provider.   *If you need a refill on your cardiac medications before your next appointment, please call your pharmacy*  Lab Work: Ask your pcp to order a A1C for your diabetes If you have labs (blood work) drawn today and your tests are completely normal, you will receive your results only by: MyChart Message (if you have MyChart) OR A paper copy in the mail If you have any lab test that is abnormal or we need to change your treatment, we will call you to review the results.  Testing/Procedures: None ordered  Follow-Up: At [facility], you and your health needs are our priority.  As part of our continuing mission to provide you with exceptional heart care, our providers are all part of one team.  This team includes your primary Cardiologist (physician) and Advanced Practice Providers or APPs (Physician Assistants and Nurse Practitioners) who all work together to provide you with the care you need, when you need it.  Your next appointment:   3-4 month(s)  Provider:   Gordy Bergamo, MD    We recommend signing up for the patient portal called MyChart.  Sign up information is provided on this After Visit Summary.  MyChart is used to connect with patients for Virtual Visits (Telemedicine).  Patients are able to view lab/test results, encounter notes, upcoming appointments, etc.  Non-urgent messages can be sent to your provider as well.   To learn more about what you can do with MyChart, go to [email or web address].   Other Instructions Please check your weight daily. Please  contact the office if you gain more than 3lbs in a day or 5lbs in a week.  Limit your salt intake to 1500-2000mg  per day or 500mg  of Sodium per meal.   Please check your blood pressure daily for two weeks, then contact the office with your readings  Please contact the office with your readings either by phone, by dropping it off in person, or by sending it through MyChart.   Be sure to check your blood pressure one to two hours after taking your medications.  Avoid the following for 30 minutes before checking your blood pressure: No caffeine No alcohol No eating No smoking  No exercise  Five minutes before checking your blood pressure: Use the restroom Sit up straight in a chair with your back supported and feet flat on the floor Remain quiet and do not talk

## 2024-05-16 ENCOUNTER — Other Ambulatory Visit: Payer: Self-pay | Admitting: *Deleted

## 2024-05-16 NOTE — Telephone Encounter (Signed)
 Received refill request from [facility] (mail order) for metoprolol  succ er 50mg  tabs take one tab by mouth daily.  Chart reflects Metoprolol  Tartrate 25mg  2 times daily last dispensed on 04/30/24 #60.  Pt has an appt  on 1/13 with Dr Tobie and should not be out of medication. Will address during 1/13 visit.   Pt has multiple pharmacies on profile, CMA contacted pt (spoke w/son) to confirm where refill should go.  Pt and son states medication should be sent to pharmacy below  [facility] [id number] [place], [place] - [address] [place], [place] [address] Phone: [phone]  Fax: [phone]

## 2024-05-16 NOTE — Telephone Encounter (Signed)
 Copied from CRM #[id number]. Topic: Clinical - Medication Question >> May 15, 2024  2:19 PM DeAngela L wrote: Reason for CRM: patient son Darilyn is asking if the doctor can give him a call he spoke the doctor before thanksgiving and he has additional questions now  Darilyn hermanns [phone] >> May 16, 2024  9:14 AM Farrel B wrote: Son returned call to [facility] for assistance

## 2024-05-16 NOTE — Telephone Encounter (Signed)
 Call from pt's son, Darilyn, who stated pt has an appt with neurology 07/08/24; stated his mother needs to be seen sooner and wants to know we could call their office for a sooner appt. Stated pt is having trouble sleeping, having some hallucinations -mainly at night.  I will ask Chilon to f/u.

## 2024-05-19 ENCOUNTER — Telehealth: Payer: Self-pay | Admitting: *Deleted

## 2024-05-19 NOTE — Telephone Encounter (Signed)
 Will forward to [employer].                         Copied from CRM [id number]. Topic: Clinical - Medication Question >> May 15, 2024  2:19 PM DeAngela L wrote: Reason for CRM: patient son Darilyn is asking if the doctor can give him a call he spoke the doctor before thanksgiving and he has additional questions now  Darilyn hermanns [phone] >> May 16, 2024  9:14 AM Farrel B wrote: Son returned call to [facility] for assistance

## 2024-05-20 ENCOUNTER — Encounter: Attending: Physical Medicine & Rehabilitation | Admitting: Physical Medicine & Rehabilitation

## 2024-05-20 ENCOUNTER — Other Ambulatory Visit: Payer: Self-pay

## 2024-05-20 ENCOUNTER — Ambulatory Visit

## 2024-05-20 VITALS — BP 144/83 | HR 66 | Temp 97.5°F | Ht 63.0 in

## 2024-05-20 DIAGNOSIS — G8929 Other chronic pain: Secondary | ICD-10-CM

## 2024-05-20 DIAGNOSIS — I4891 Unspecified atrial fibrillation: Secondary | ICD-10-CM

## 2024-05-20 DIAGNOSIS — I69354 Hemiplegia and hemiparesis following cerebral infarction affecting left non-dominant side: Secondary | ICD-10-CM | POA: Diagnosis not present

## 2024-05-20 DIAGNOSIS — R569 Unspecified convulsions: Secondary | ICD-10-CM | POA: Diagnosis not present

## 2024-05-20 DIAGNOSIS — Z794 Long term (current) use of insulin: Secondary | ICD-10-CM

## 2024-05-20 DIAGNOSIS — I10 Essential (primary) hypertension: Secondary | ICD-10-CM

## 2024-05-20 DIAGNOSIS — F321 Major depressive disorder, single episode, moderate: Secondary | ICD-10-CM

## 2024-05-20 DIAGNOSIS — E1129 Type 2 diabetes mellitus with other diabetic kidney complication: Secondary | ICD-10-CM

## 2024-05-20 DIAGNOSIS — M25511 Pain in right shoulder: Secondary | ICD-10-CM

## 2024-05-20 MED ORDER — FREESTYLE LIBRE 3 READER DEVI
1.0000 | Freq: Once | 0 refills | Status: AC
  Filled 2024-05-20: qty 1, 30d supply, fill #0

## 2024-05-20 MED ORDER — FREESTYLE LIBRE 3 PLUS SENSOR MISC
6 refills | Status: DC
  Filled 2024-05-20: qty 2, 30d supply, fill #0

## 2024-05-20 MED ORDER — METOPROLOL TARTRATE 25 MG PO TABS: 25.0000 mg | ORAL_TABLET | Freq: Two times a day (BID) | ORAL | 6 refills | Status: AC

## 2024-05-20 MED ORDER — SERTRALINE HCL 25 MG PO TABS
25.0000 mg | ORAL_TABLET | Freq: Every day | ORAL | 2 refills | Status: DC
  Filled 2024-05-20: qty 30, 30d supply, fill #0

## 2024-05-20 MED ORDER — AMLODIPINE BESYLATE 5 MG PO TABS
5.0000 mg | ORAL_TABLET | Freq: Every day | ORAL | 3 refills | Status: AC
  Filled 2024-05-20: qty 90, 90d supply, fill #0

## 2024-05-20 NOTE — Assessment & Plan Note (Signed)
 The patient presented today with her son who is her primary caretaker.  They discussed that she has been having increasing episodes of tearfulness that are occurring in the later evening part of the day.  The son was concerned for sundowning.  He reported that she was not remembering things correctly that had happened earlier in the day.  He also states that she has been crying more in the evening which is unusual for her.  PHQ-9 performed today scored 10.  This is consistent with major depressive disorder.  Likely that her symptoms stem somewhat from her loss of independence with her prior stroke.  She is currently limited to some transfers.  She is not cooking or cleaning for herself.  He is able to eat without assistance.  He spends most of her day in the wheelchair.  Her symptoms do seem to worsen in the evening but do not seem classic for sundowning as there seems to be no hallucinations occurring and her overall mood is depressed as well, not just in the evenings.  - Will start sertraline  25 mg daily and follow-up for mood symptoms in 1 month.

## 2024-05-20 NOTE — Assessment & Plan Note (Addendum)
 Patient with past medical history of uncontrolled diabetes mellitus.  Patient is currently prescribed Lantus  11 units daily but is only taking 10.  Fasting sugars are measuring between 120s-130s.  Patient denies any concerns at this time.  Stopped Ozempic  due to family preference. Previous A1c 7.7 on 03/23/2024.  Blood sugars are appropriately measuring at this time.  Will continue current treatment.  - A1c in February 2025 - Continue Lantus  11 units daily - Follow-up in 1 month for A1C - Ordered CGM to assist with glucose control as the patient is on insulin  and has fluctuating appetite -Unable to obtain urine at todays visit but will need UACR at next visit to quantify proteinuria seen at recent visits.

## 2024-05-20 NOTE — Telephone Encounter (Signed)
 Medication sent to pharmacy

## 2024-05-20 NOTE — Assessment & Plan Note (Signed)
 Patient and son requesting referral to physical therapy that has been placed in the past.  New order for physical therapy placed.

## 2024-05-20 NOTE — Progress Notes (Signed)
 "  CC: Routine Follow Up for chronic conditions and chief complaint of mood symptoms after last office visit 05/06/2024  HPI:  Megan Fischer is a 74 y.o. female with pertinent PMH of A-fib on Eliquis , HTN, DMII, prior CVA with left-sided deficits who presents for follow up for chronic conditions and chief concern of mood changes. Please see problem based assessment and plan for further history.  Since last visit on December 30, the patient has been to the ED twice, once for seizures and once for dyspepsia.  She was started on Keppra  500 mg twice daily and scheduled to see neurology for her seizures.  For her dyspepsia, no medication changes were made as her symptoms had resolved with eating food.  ROS  Medications: Current Outpatient Medications  Medication Instructions   acetaminophen  (TYLENOL ) 325-650 mg, Oral, Every 4 hours PRN   amLODipine  (NORVASC ) 5 mg, Oral, Daily   aspirin  EC 81 mg, Oral, Daily, Swallow whole.   Blood Glucose Monitoring Suppl DEVI 1 each, Does not apply, As directed, Dispense based on patient and insurance preference. Use up to four times daily as directed. (FOR ICD-10 E10.9, E11.9).   Blood Pressure Monitoring (OMRON 3 SERIES BP MONITOR) DEVI Monitor blood pressure daily.   camphor-menthol  (SARNA) lotion Topical, As needed   Continuous Glucose Receiver (FREESTYLE LIBRE 3 READER) DEVI Use as directed.   Continuous Glucose Sensor (FREESTYLE LIBRE 3 PLUS SENSOR) MISC Change sensor every 15 days.   diclofenac  Sodium (VOLTAREN ) 2 g, Topical, 4 times daily, To both knees   Eliquis  5 mg, Oral, 2 times daily   ezetimibe  (ZETIA ) 10 mg, Oral, Daily   famotidine  (PEPCID ) 20 mg, Oral, Daily   Glucose Blood (BLOOD GLUCOSE TEST STRIPS) STRP 1 each, Does not apply, As directed, Dispense based on patient and insurance preference. Use up to four times daily as directed. (FOR ICD-10 E10.9, E11.9).   HYDROcodone -acetaminophen  (NORCO/VICODIN) 5-325 MG tablet 2 tablets, Oral, Every 6  hours PRN   Insulin  Pen Needle 32G X 4 MM MISC Use daily at 6am with insulin  pen.   Lancet Device MISC 1 each, Does not apply, As directed, Dispense based on patient and insurance preference. Use up to four times daily as directed. (FOR ICD-10 E10.9, E11.9).   Lancets MISC 1 each, Does not apply, As directed, Dispense based on patient and insurance preference. Use up to four times daily as directed. (FOR ICD-10 E10.9, E11.9).   Lantus  SoloStar 11 Units, Subcutaneous, Daily   levETIRAcetam  (KEPPRA ) 500 mg, Oral, 2 times daily   lidocaine  (LIDODERM ) 5 % 2 patches, Transdermal, Every 24 hours, Remove & Discard patch within 12 hours or as directed by MD   magnesium  oxide (MAG-OX) 400 mg, Oral, 2 times daily   melatonin 5 mg, Oral, At bedtime PRN   methimazole  (TAPAZOLE ) 10 mg, Oral, Daily   metoprolol  tartrate (LOPRESSOR ) 25 mg, Oral, 2 times daily   Multiple Vitamins-Minerals (MULTIVITAMIN WITH MINERALS) tablet 1 tablet, Daily   olmesartan  (BENICAR ) 40 mg, Oral, Daily   polyethylene glycol (MIRALAX  / GLYCOLAX ) 17 g, Oral, Daily PRN   rosuvastatin  (CRESTOR ) 20 mg, Oral, Daily after supper   senna (SENOKOT) 17.2 mg, Oral, Daily   sertraline  (ZOLOFT ) 25 mg, Oral, Daily     Physical Exam:  Vitals:   05/20/24 0940 05/20/24 1022  BP: (!) 154/91 (!) 144/83  Pulse: (!) 56 66  Temp: (!) 97.5 F (36.4 C)   TempSrc: Oral   SpO2: 97%   Height: 5' 3 (  1.6 m)     Physical Exam Constitutional:      General: She is not in acute distress.    Appearance: She is not ill-appearing.  Cardiovascular:     Rate and Rhythm: Normal rate. Rhythm irregular.  Pulmonary:     Effort: No respiratory distress.  Musculoskeletal:     Right lower leg: Edema present.     Left lower leg: Edema present.     Comments: Mild lower extremity edema  Neurological:     Mental Status: She is alert.     Comments: 4/5 strength left upper and lower extremity.  5/5 strength right upper and lower extremity  Psychiatric:      Comments: Depressed mood       Assessment & Plan:   Assessment & Plan Current moderate episode of major depressive disorder without prior episode [facility]) The patient presented today with her son who is her primary caretaker.  They discussed that she has been having increasing episodes of tearfulness that are occurring in the later evening part of the day.  The son was concerned for sundowning.  He reported that she was not remembering things correctly that had happened earlier in the day.  He also states that she has been crying more in the evening which is unusual for her.  PHQ-9 performed today scored 10.  This is consistent with major depressive disorder.  Likely that her symptoms stem somewhat from her loss of independence with her prior stroke.  She is currently limited to some transfers.  She is not cooking or cleaning for herself.  He is able to eat without assistance.  He spends most of her day in the wheelchair.  Her symptoms do seem to worsen in the evening but do not seem classic for sundowning as there seems to be no hallucinations occurring and her overall mood is depressed as well, not just in the evenings.  - Will start sertraline  25 mg daily and follow-up for mood symptoms in 1 month. DM (diabetes mellitus) type II controlled with renal manifestation [facility]) Patient with past medical history of uncontrolled diabetes mellitus.  Patient is currently prescribed Lantus  11 units daily but is only taking 10.  Fasting sugars are measuring between 120s-130s.  Patient denies any concerns at this time.  Stopped Ozempic  due to family preference. Previous A1c 7.7 on 03/23/2024.  Blood sugars are appropriately measuring at this time.  Will continue current treatment.  - A1c in February 2025 - Continue Lantus  11 units daily - Follow-up in 1 month for A1C - Ordered CGM to assist with glucose control as the patient is on insulin  and has fluctuating appetite -Unable to obtain urine at todays  visit but will need UACR at next visit to quantify proteinuria seen at recent visits. Atrial fibrillation, unspecified type [facility]) Patient with past medical history of atrial fibrillation.  Patient is on Eliquis  5 mg twice daily.  No acute concerns or bleeding at the time.  Plan: - Continue Eliquis  5 mg twice daily, refills provided - Continue Metoprolol  tartrate 25 mg BID Primary hypertension Patient presents today with elevated blood pressures but better controlled than last visit. BP today 144/83. Benicar  increased to 40mg  at recent appointment. She remains uncontrolled so we will add on Amlodipine  5mg  daily to her regimen. The patient and her son were advised to keep a blood pressure log.  Offered to combine the amlodipine  and the Benicar  into 1 combo pill but the patient and her son declined.  - Continue  Benicar  40 mg daily - Continue Lopressor  25 mg twice daily - Start Amlodipine  5mg  daily  Hemiplegia and hemiparesis following cerebral infarction affecting left non-dominant side (HCC) Chronic right shoulder pain Patient and son requesting referral to physical therapy that has been placed in the past.  New order for physical therapy placed.   Seizure [facility]) Patient presented to the ER on January 4 with a chief concern of seizure.  She was started on Keppra  500 twice daily and has been taking this twice daily.  She reports that she has not had any seizures since that episode.  They would like to know about an earlier follow-up with neurology to discuss their options since she has both seizures and strokes.  I will reach out to them to ask this but I did explain that it is most likely that they will have to follow-up as previously scheduled.   -Will reach out to neurology for earlier appointment given new diagnosis of seizures  Orders Placed This Encounter  Procedures   Microalbumin / Creatinine Urine Ratio   Ambulatory referral to Physical Therapy    Referral Priority:   Routine     Referral Type:   Physical Medicine    Referral Reason:   Specialty Services Required    Requested Specialty:   Physical Therapy    Number of Visits Requested:   1      Patient discussed with Dr. Mliss Lovie Melvenia Napoleon, MD [facility] Internal Medicine Resident PGY-1 Clinic Phone: [phone] Please contact the on call pager at [phone] for any urgent or emergent needs.  "

## 2024-05-20 NOTE — Patient Instructions (Signed)
 Thank you, Ms. [name], for allowing us  to provide your care today. Today we discussed . . .  > Depression       - I have started a medication called sertraline  for you to help th your mood. Please follow up in one month > High blood pressure       - Your blood pressure was still high today so we will start you on amlodipine  5 mg daily.  Lowering your blood pressure will reduce your risk of strokes in the future. > Right shoulder pain       - I have placed another referral to physical therapy. >Diabetes  - I have ordered a continuous glucose monitor and receiver for you.  This should help to control your diabetes.   I have ordered the following labs for you:   Lab Orders         Microalbumin / Creatinine Urine Ratio       Referrals ordered today:    Referral Orders         Ambulatory referral to Physical Therapy       Follow up: 1 month    Remember:  Should you have any questions or concerns please call the internal medicine clinic at [phone].     Melvenia Morrison, [facility]

## 2024-05-20 NOTE — Assessment & Plan Note (Signed)
 Patient presents today with elevated blood pressures but better controlled than last visit. BP today 144/83. Benicar  increased to 40mg  at recent appointment. She remains uncontrolled so we will add on Amlodipine  5mg  daily to her regimen. The patient and her son were advised to keep a blood pressure log.  Offered to combine the amlodipine  and the Benicar  into 1 combo pill but the patient and her son declined.  - Continue Benicar  40 mg daily - Continue Lopressor  25 mg twice daily - Start Amlodipine  5mg  daily

## 2024-05-20 NOTE — Assessment & Plan Note (Signed)
 Patient with past medical history of atrial fibrillation.  Patient is on Eliquis  5 mg twice daily.  No acute concerns or bleeding at the time.  Plan: - Continue Eliquis  5 mg twice daily, refills provided - Continue Metoprolol  tartrate 25 mg BID

## 2024-05-21 NOTE — Progress Notes (Signed)
 Internal Medicine Clinic Attending  Case discussed with the resident at the time of the visit.  We reviewed the resident's history and exam and pertinent patient test results.  I agree with the assessment, diagnosis, and plan of care documented in the resident's note.

## 2024-06-02 ENCOUNTER — Emergency Department (HOSPITAL_COMMUNITY)

## 2024-06-02 ENCOUNTER — Observation Stay (HOSPITAL_COMMUNITY)
Admission: EM | Admit: 2024-06-02 | Discharge: 2024-06-03 | Disposition: A | Discharge status: Home / Self Care | Attending: Infectious Diseases | Admitting: Infectious Diseases

## 2024-06-02 ENCOUNTER — Encounter (HOSPITAL_COMMUNITY): Payer: Self-pay

## 2024-06-02 ENCOUNTER — Other Ambulatory Visit: Payer: Self-pay

## 2024-06-02 DIAGNOSIS — Z794 Long term (current) use of insulin: Secondary | ICD-10-CM

## 2024-06-02 DIAGNOSIS — Z8673 Personal history of transient ischemic attack (TIA), and cerebral infarction without residual deficits: Secondary | ICD-10-CM

## 2024-06-02 DIAGNOSIS — E042 Nontoxic multinodular goiter: Secondary | ICD-10-CM | POA: Diagnosis not present

## 2024-06-02 DIAGNOSIS — N179 Acute kidney failure, unspecified: Secondary | ICD-10-CM | POA: Diagnosis not present

## 2024-06-02 DIAGNOSIS — N1832 Chronic kidney disease, stage 3b: Secondary | ICD-10-CM | POA: Diagnosis not present

## 2024-06-02 DIAGNOSIS — I69354 Hemiplegia and hemiparesis following cerebral infarction affecting left non-dominant side: Secondary | ICD-10-CM | POA: Insufficient documentation

## 2024-06-02 DIAGNOSIS — Z7982 Long term (current) use of aspirin: Secondary | ICD-10-CM | POA: Insufficient documentation

## 2024-06-02 DIAGNOSIS — I4891 Unspecified atrial fibrillation: Secondary | ICD-10-CM | POA: Diagnosis not present

## 2024-06-02 DIAGNOSIS — E1122 Type 2 diabetes mellitus with diabetic chronic kidney disease: Secondary | ICD-10-CM | POA: Insufficient documentation

## 2024-06-02 DIAGNOSIS — E1169 Type 2 diabetes mellitus with other specified complication: Secondary | ICD-10-CM

## 2024-06-02 DIAGNOSIS — Z79899 Other long term (current) drug therapy: Secondary | ICD-10-CM | POA: Diagnosis not present

## 2024-06-02 DIAGNOSIS — Z7901 Long term (current) use of anticoagulants: Secondary | ICD-10-CM | POA: Diagnosis not present

## 2024-06-02 DIAGNOSIS — R0789 Other chest pain: Secondary | ICD-10-CM | POA: Diagnosis not present

## 2024-06-02 DIAGNOSIS — N183 Chronic kidney disease, stage 3 unspecified: Secondary | ICD-10-CM | POA: Diagnosis not present

## 2024-06-02 DIAGNOSIS — M7989 Other specified soft tissue disorders: Secondary | ICD-10-CM | POA: Diagnosis not present

## 2024-06-02 DIAGNOSIS — R350 Frequency of micturition: Secondary | ICD-10-CM | POA: Insufficient documentation

## 2024-06-02 DIAGNOSIS — E0590 Thyrotoxicosis, unspecified without thyrotoxic crisis or storm: Secondary | ICD-10-CM | POA: Diagnosis not present

## 2024-06-02 DIAGNOSIS — E1129 Type 2 diabetes mellitus with other diabetic kidney complication: Secondary | ICD-10-CM | POA: Diagnosis present

## 2024-06-02 DIAGNOSIS — I10 Essential (primary) hypertension: Secondary | ICD-10-CM | POA: Diagnosis present

## 2024-06-02 DIAGNOSIS — I129 Hypertensive chronic kidney disease with stage 1 through stage 4 chronic kidney disease, or unspecified chronic kidney disease: Secondary | ICD-10-CM | POA: Diagnosis not present

## 2024-06-02 DIAGNOSIS — R079 Chest pain, unspecified: Secondary | ICD-10-CM | POA: Diagnosis present

## 2024-06-02 DIAGNOSIS — R5381 Other malaise: Secondary | ICD-10-CM | POA: Insufficient documentation

## 2024-06-02 LAB — CBG MONITORING, ED
Glucose-Capillary: 172 mg/dL — ABNORMAL HIGH (ref 70–99)
Glucose-Capillary: 263 mg/dL — ABNORMAL HIGH (ref 70–99)

## 2024-06-02 LAB — BASIC METABOLIC PANEL WITH GFR
Anion gap: 12 (ref 5–15)
BUN: 73 mg/dL — ABNORMAL HIGH (ref 8–23)
CO2: 20 mmol/L — ABNORMAL LOW (ref 22–32)
Calcium: 9.9 mg/dL (ref 8.9–10.3)
Chloride: 109 mmol/L (ref 98–111)
Creatinine, Ser: 1.69 mg/dL — ABNORMAL HIGH (ref 0.44–1.00)
GFR, Estimated: 32 mL/min — ABNORMAL LOW
Glucose, Bld: 191 mg/dL — ABNORMAL HIGH (ref 70–99)
Potassium: 4.7 mmol/L (ref 3.5–5.1)
Sodium: 140 mmol/L (ref 135–145)

## 2024-06-02 LAB — CBC
HCT: 40.2 % (ref 36.0–46.0)
Hemoglobin: 13.1 g/dL (ref 12.0–15.0)
MCH: 29.6 pg (ref 26.0–34.0)
MCHC: 32.6 g/dL (ref 30.0–36.0)
MCV: 90.7 fL (ref 80.0–100.0)
Platelets: 192 10*3/uL (ref 150–400)
RBC: 4.43 MIL/uL (ref 3.87–5.11)
RDW: 15.9 % — ABNORMAL HIGH (ref 11.5–15.5)
WBC: 4 10*3/uL (ref 4.0–10.5)
nRBC: 0 % (ref 0.0–0.2)

## 2024-06-02 LAB — TROPONIN T, HIGH SENSITIVITY
Troponin T High Sensitivity: 39 ng/L — ABNORMAL HIGH (ref 0–19)
Troponin T High Sensitivity: 37 ng/L — ABNORMAL HIGH (ref 0–19)

## 2024-06-02 MED ORDER — EZETIMIBE 10 MG PO TABS
10.0000 mg | ORAL_TABLET | Freq: Every day | ORAL | Status: DC
  Administered 2024-06-03: 10 mg via ORAL
  Filled 2024-06-02: qty 1

## 2024-06-02 MED ORDER — LIDOCAINE 5 % EX PTCH
1.0000 | MEDICATED_PATCH | CUTANEOUS | Status: DC
  Administered 2024-06-02: 1 via TRANSDERMAL
  Filled 2024-06-02: qty 1

## 2024-06-02 MED ORDER — ROSUVASTATIN CALCIUM 20 MG PO TABS
20.0000 mg | ORAL_TABLET | Freq: Every day | ORAL | Status: DC
  Administered 2024-06-02: 20 mg via ORAL
  Filled 2024-06-02: qty 1

## 2024-06-02 MED ORDER — LEVETIRACETAM 500 MG PO TABS
500.0000 mg | ORAL_TABLET | Freq: Two times a day (BID) | ORAL | Status: DC
  Administered 2024-06-02 – 2024-06-03 (×3): 500 mg via ORAL
  Filled 2024-06-02 (×3): qty 1

## 2024-06-02 MED ORDER — APIXABAN 5 MG PO TABS
5.0000 mg | ORAL_TABLET | Freq: Two times a day (BID) | ORAL | Status: DC
  Administered 2024-06-02 – 2024-06-03 (×3): 5 mg via ORAL
  Filled 2024-06-02 (×3): qty 1

## 2024-06-02 MED ORDER — DICLOFENAC SODIUM 1 % EX GEL
2.0000 g | Freq: Four times a day (QID) | CUTANEOUS | Status: DC
  Administered 2024-06-03: 2 g via TOPICAL
  Filled 2024-06-02: qty 100

## 2024-06-02 MED ORDER — FAMOTIDINE IN NACL 20-0.9 MG/50ML-% IV SOLN
20.0000 mg | Freq: Once | INTRAVENOUS | Status: AC
  Administered 2024-06-02: 20 mg via INTRAVENOUS
  Filled 2024-06-02: qty 50

## 2024-06-02 MED ORDER — ADULT MULTIVITAMIN W/MINERALS CH
1.0000 | ORAL_TABLET | Freq: Every day | ORAL | Status: DC
  Administered 2024-06-02 – 2024-06-03 (×2): 1 via ORAL
  Filled 2024-06-02 (×2): qty 1

## 2024-06-02 MED ORDER — METOPROLOL TARTRATE 25 MG PO TABS
25.0000 mg | ORAL_TABLET | Freq: Two times a day (BID) | ORAL | Status: DC
  Administered 2024-06-02 – 2024-06-03 (×2): 25 mg via ORAL
  Filled 2024-06-02 (×2): qty 1

## 2024-06-02 MED ORDER — MULTI-VITAMIN/MINERALS PO TABS: 1.0000 | ORAL_TABLET | Freq: Every day | ORAL | Status: DC

## 2024-06-02 MED ORDER — LACTATED RINGERS IV BOLUS
500.0000 mL | Freq: Once | INTRAVENOUS | Status: AC
  Administered 2024-06-02: 500 mL via INTRAVENOUS

## 2024-06-02 MED ORDER — INSULIN GLARGINE-YFGN 100 UNIT/ML ~~LOC~~ SOLN
11.0000 [IU] | Freq: Every day | SUBCUTANEOUS | Status: DC
  Administered 2024-06-02 – 2024-06-03 (×2): 11 [IU] via SUBCUTANEOUS
  Filled 2024-06-02 (×3): qty 0.11

## 2024-06-02 MED ORDER — POLYETHYLENE GLYCOL 3350 17 G PO PACK: 17.0000 g | PACK | Freq: Every day | ORAL | Status: DC | PRN

## 2024-06-02 MED ORDER — ACETAMINOPHEN 500 MG PO TABS
1000.0000 mg | ORAL_TABLET | ORAL | Status: AC
  Administered 2024-06-02: 1000 mg via ORAL
  Filled 2024-06-02: qty 2

## 2024-06-02 MED ORDER — INSULIN ASPART 100 UNIT/ML IJ SOLN
0.0000 [IU] | Freq: Three times a day (TID) | INTRAMUSCULAR | Status: DC
  Administered 2024-06-02: 2 [IU] via SUBCUTANEOUS
  Administered 2024-06-02: 5 [IU] via SUBCUTANEOUS
  Administered 2024-06-03: 2 [IU] via SUBCUTANEOUS
  Administered 2024-06-03: 1 [IU] via SUBCUTANEOUS
  Filled 2024-06-02 (×2): qty 2
  Filled 2024-06-02: qty 1
  Filled 2024-06-02: qty 5

## 2024-06-02 MED ORDER — MELATONIN 5 MG PO TABS: 5.0000 mg | ORAL_TABLET | Freq: Every evening | ORAL | Status: DC | PRN

## 2024-06-02 MED ORDER — FAMOTIDINE 20 MG PO TABS
20.0000 mg | ORAL_TABLET | Freq: Every day | ORAL | Status: DC
  Administered 2024-06-02 – 2024-06-03 (×2): 20 mg via ORAL
  Filled 2024-06-02 (×2): qty 1

## 2024-06-02 MED ORDER — SERTRALINE HCL 50 MG PO TABS
25.0000 mg | ORAL_TABLET | Freq: Every day | ORAL | Status: DC
  Administered 2024-06-02 – 2024-06-03 (×2): 25 mg via ORAL
  Filled 2024-06-02 (×2): qty 1

## 2024-06-02 MED ORDER — ACETAMINOPHEN 325 MG PO TABS
325.0000 mg | ORAL_TABLET | ORAL | Status: DC | PRN
  Administered 2024-06-02: 650 mg via ORAL
  Filled 2024-06-02: qty 2

## 2024-06-02 MED ORDER — SENNA 8.6 MG PO TABS
2.0000 | ORAL_TABLET | Freq: Every day | ORAL | Status: DC
  Administered 2024-06-02 – 2024-06-03 (×2): 17.2 mg via ORAL
  Filled 2024-06-02 (×2): qty 2

## 2024-06-02 MED ORDER — AMLODIPINE BESYLATE 5 MG PO TABS
5.0000 mg | ORAL_TABLET | Freq: Every day | ORAL | Status: DC
  Administered 2024-06-02 – 2024-06-03 (×2): 5 mg via ORAL
  Filled 2024-06-02 (×2): qty 1

## 2024-06-02 MED ORDER — ASPIRIN 81 MG PO TBEC
81.0000 mg | DELAYED_RELEASE_TABLET | Freq: Every day | ORAL | Status: DC
  Administered 2024-06-02 – 2024-06-03 (×2): 81 mg via ORAL
  Filled 2024-06-02 (×2): qty 1

## 2024-06-02 MED ORDER — NITROGLYCERIN 0.4 MG SL SUBL: 0.4000 mg | SUBLINGUAL_TABLET | SUBLINGUAL | Status: DC | PRN

## 2024-06-02 MED ORDER — METHIMAZOLE 5 MG PO TABS
10.0000 mg | ORAL_TABLET | Freq: Every day | ORAL | Status: DC
  Administered 2024-06-02 – 2024-06-03 (×2): 10 mg via ORAL
  Filled 2024-06-02 (×2): qty 2

## 2024-06-02 MED ORDER — CAMPHOR-MENTHOL 0.5-0.5 % EX LOTN: TOPICAL_LOTION | CUTANEOUS | Status: DC | PRN

## 2024-06-02 NOTE — ED Triage Notes (Signed)
 Presents to ED via EMS from home for chest pain for 1 week. Radiates down left arm. 1 nitroglycerin  administered en route with short lived improvement. No aspirin  due to allergy. Denies SOB, nausea, vomiting. Hx of afib,CVA with left side deficits.

## 2024-06-02 NOTE — Progress Notes (Signed)
 Left lower extremity venous duplex has been completed. Results can be found in chart review under CV Proc.  06/02/2024 10:38 AM  Illyanna Petillo Elden Appl, RVT.

## 2024-06-02 NOTE — Hospital Course (Signed)
 Cp- two weeks ago started, sharp, left more than right, not pleuritic Sometimes go to fingers in left hand Mocing left arem makes it hurt more   Urinating more than usual about every 30 minutes  2 weeks, UA nl   Sweating more than usual   Son knows meds, last took meds this AM  Pain slowly progressing   No presyncope  Uses wheel chair,  Feels like left side is weaker than usual   Dnr - dni- no icu

## 2024-06-02 NOTE — H&P (Signed)
 " Date: 06/02/2024               Patient Name:  Megan Fischer MRN: [id number]  DOB: 1950/06/04 Age / Sex: 74 y.o., female   PCP: Bernadine Manos, MD         Medical Service: Internal Medicine Teaching Service         Attending Physician: Dr. Eben Reyes BROCKS, MD      First Contact: Dr. Melvenia Morrison, MD    Second Contact: Dr. 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  She denies any definite exertional chest pain although she is very sedentary.  Chest pain is not pleuritic in nature.  She reports slowly progressive worsening of her chest pain and no known associated event to trigger her pain initially.  No presyncope, no nausea, no vomiting, no abdominal pain, no change in bowel habits.  She has been urinating more often  over the past 2 weeks but denies any dysuria or fever.  She also has noticed some possible increased weakness in her left side over the past 2 weeks saying that she has to sometimes hold her left arm up with her right arm which is not how it used to be.  She denies any decreased oral intake and has actually been more hungry recently.  She has no issues accessing food.  She has adequate water  intake.   ED Course: Presented as above with no significant abnormalities on vitals.  Metabolic panel showed an increase in her creatinine from 1.09-1.69 and BUN of 73.  CBC without any significant  abnormalities.  Troponin 39 then 37.  No significant abnormalities on chest x-ray, no ischemic changes on EKG, and no lower extremity DVT on preliminary venous Doppler reading.  Due to her multiple comorbid conditions IMTS was paged for admission.  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 Hyperthyroidism 2/2 multinodular goiter Hypertension GERD Prior pulmonary embolism Pulmonary hypertension  Meds:  Medications handled by her son and he will bring a list in to review Confirmed medications listed below verbally over the phone and through fill history Eliquis  5 mg twice daily Olmesartan  40 mg daily Rosuvastatin  20 mg nightly Methimazole  10 mg daily Levetiracetam  500 mg twice daily Lantus  11 units daily  Current Outpatient Medications  Medication Instructions   acetaminophen  (TYLENOL ) 325-650 mg, Oral, Every 4 hours PRN   amLODipine  (NORVASC ) 5 mg, Oral, Daily   aspirin  EC 81 mg, Oral, Daily, Swallow whole.   Blood Glucose Monitoring Suppl DEVI 1 each, Does not apply, As directed, Dispense based on patient and insurance preference. Use up to four times daily as directed. (FOR ICD-10 E10.9, E11.9).   Blood Pressure Monitoring (OMRON 3 SERIES BP MONITOR) DEVI Monitor blood pressure daily.   camphor-menthol  (SARNA) lotion Topical, As needed   Continuous Glucose Sensor (FREESTYLE LIBRE 3 PLUS SENSOR)  MISC Change sensor every 15 days.   diclofenac  Sodium (VOLTAREN ) 2 g, Topical, 4 times daily, To both knees   Eliquis  5 mg, Oral, 2 times daily   ezetimibe  (ZETIA ) 10 mg, Oral, Daily   famotidine  (PEPCID ) 20 mg, Oral, Daily   Glucose Blood (BLOOD GLUCOSE TEST STRIPS) STRP 1 each, Does not apply, As directed, Dispense based on patient and insurance preference. Use up to four times daily as directed. (FOR ICD-10 E10.9, E11.9).   HYDROcodone -acetaminophen  (NORCO/VICODIN) 5-325 MG tablet  2 tablets, Oral, Every 6 hours PRN   Insulin  Pen Needle 32G X 4 MM MISC Use daily at 6am with insulin  pen.   Lancet Device MISC 1 each, Does not apply, As directed, Dispense based on patient and insurance preference. Use up to four times daily as directed. (FOR ICD-10 E10.9, E11.9).   Lancets MISC 1 each, Does not apply, As directed, Dispense based on patient and insurance preference. Use up to four times daily as directed. (FOR ICD-10 E10.9, E11.9).   Lantus  SoloStar 11 Units, Subcutaneous, Daily   levETIRAcetam  (KEPPRA ) 500 mg, Oral, 2 times daily   lidocaine  (LIDODERM ) 5 % 2 patches, Transdermal, Every 24 hours, Remove & Discard patch within 12 hours or as directed by MD   magnesium  oxide (MAG-OX) 400 mg, Oral, 2 times daily   melatonin 5 mg, Oral, At bedtime PRN   methimazole  (TAPAZOLE ) 10 mg, Oral, Daily   metoprolol  tartrate (LOPRESSOR ) 25 mg, Oral, 2 times daily   Multiple Vitamins-Minerals (MULTIVITAMIN WITH MINERALS) tablet 1 tablet, Daily   olmesartan  (BENICAR ) 40 mg, Oral, Daily   polyethylene glycol (MIRALAX  / GLYCOLAX ) 17 g, Oral, Daily PRN   rosuvastatin  (CRESTOR ) 20 mg, Oral, Daily after supper   senna (SENOKOT) 17.2 mg, Oral, Daily   sertraline  (ZOLOFT ) 25 mg, Oral, Daily     Past Surgical History Past Surgical History:  Procedure Laterality Date   CHOLECYSTECTOMY     REPLACEMENT TOTAL KNEE BILATERAL     SHOULDER SURGERY     RIGHT SHOULDER   TUBAL LIGATION      Social:  Lives with son at home.  She is dependent in some ADLs and all IADLs.  Uses a wheelchair for transportation and needs help for transfers. PCP: Azadegan, Maryam, MD Substances: Denies current or prior tobacco, alcohol, or drug use.  Family History:  Family History  Problem Relation Age of Onset   Heart attack Mother    Stroke Mother    Diabetes Mother    Hypertension Brother    Breast cancer Neg Hx     Allergies: Allergies as of 06/02/2024 - Review Complete 06/02/2024  Allergen Reaction  Noted   Aspirin  Nausea Only 11/16/2020   Codeine Other (See Comments) 08/25/2010   Fentanyl  Other (See Comments) 11/12/2020   Oxycodone hcl Other (See Comments) 08/25/2010   Penicillins Other (See Comments) 08/25/2010   Tape Dermatitis 06/02/2024   Tomato Itching 06/02/2024   Chlorhexidine gluconate Rash 06/04/2015    Review of Systems: A complete ROS was negative except as per HPI.   OBJECTIVE:   Physical Exam: Blood pressure 125/72, pulse 71, temperature 97.6 F (36.4 C), temperature source Oral, resp. rate 16, height 5' 3 (1.6 m), weight 77.1 kg, SpO2 97%.  Constitutional: Chronically ill-appearing female laying in bed. In no acute distress. HENT: Normocephalic, atraumatic,  Eyes: Sclera non-icteric, PERRL, EOM intact Neck: Mild tenderness to palpation in the left neck musculature without overlying warmth, erythema, or asymmetry Cardio: Irregularly irregular rhythm without tachycardia. 2+  bilateral radial pulses. Pulm:Clear to auscultation bilaterally in the anterior fields. Normal work of breathing on room air. Abdomen: Soft, non-tender, non-distended, positive bowel sounds. MSK: Trace pitting edema in the right lower extremity and 1+ pitting edema in the left lower extremity without increased warmth, erythema, or other signs of infection/inflammation Skin:Warm and dry. Neuro:Alert and oriented x3.  Left upper extremity strength 4/5, right upper extremity 5/5 strength.  Mildly decreased sensation in the left trigeminal nerve distribution, otherwise cranial nerves II through XII intact.  Moves bilateral lower extremities against gravity. Psych:Pleasant mood and affect.  Labs: CBC    Component Value Date/Time   WBC 4.0 06/02/2024 0619   RBC 4.43 06/02/2024 0619   HGB 13.1 06/02/2024 0619   HGB 12.7 02/28/2024 1120   HGB 12.0 02/16/2017 0802   HCT 40.2 06/02/2024 0619   HCT 39.5 02/28/2024 1120   HCT 36.8 02/16/2017 0802   PLT 192 06/02/2024 0619   PLT 231 02/28/2024  1120   MCV 90.7 06/02/2024 0619   MCV 91 02/28/2024 1120   MCV 89.3 02/16/2017 0802   MCH 29.6 06/02/2024 0619   MCHC 32.6 06/02/2024 0619   RDW 15.9 (H) 06/02/2024 0619   RDW 13.4 02/28/2024 1120   RDW 15.6 (H) 02/16/2017 0802   LYMPHSABS 1.2 05/11/2024 1221   LYMPHSABS 1.4 02/28/2024 1120   LYMPHSABS 1.9 02/16/2017 0802   MONOABS 0.3 05/11/2024 1221   MONOABS 0.2 02/16/2017 0802   EOSABS 0.0 05/11/2024 1221   EOSABS 0.0 02/28/2024 1120   EOSABS 0.2 02/05/2006 0932   BASOSABS 0.0 05/11/2024 1221   BASOSABS 0.0 02/28/2024 1120   BASOSABS 0.0 02/16/2017 0802     CMP     Component Value Date/Time   NA 140 06/02/2024 0619   NA 141 05/06/2024 1636   NA 143 02/16/2017 0802   K 4.7 06/02/2024 0619   K 3.5 02/16/2017 0802   CL 109 06/02/2024 0619   CO2 20 (L) 06/02/2024 0619   CO2 26 02/16/2017 0802   GLUCOSE 191 (H) 06/02/2024 0619   GLUCOSE 92 02/16/2017 0802   BUN 73 (H) 06/02/2024 0619   BUN 50 (H) 05/06/2024 1636   BUN 17.2 02/16/2017 0802   CREATININE 1.69 (H) 06/02/2024 0619   CREATININE 1.33 (H) 10/12/2023 0859   CREATININE 0.9 02/16/2017 0802   CALCIUM  9.9 06/02/2024 0619   CALCIUM  9.9 02/16/2017 0802   PROT 7.1 05/11/2024 2206   PROT 7.5 02/16/2017 0802   ALBUMIN 3.8 05/11/2024 2206   ALBUMIN 3.9 02/16/2017 0802   AST 38 05/11/2024 2206   AST 25 08/08/2023 0813   AST 26 02/16/2017 0802   ALT 27 05/11/2024 2206   ALT 18 08/08/2023 0813   ALT 18 02/16/2017 0802   ALKPHOS 93 05/11/2024 2206   ALKPHOS 64 02/16/2017 0802   BILITOT 0.6 05/11/2024 2206   BILITOT 0.5 08/08/2023 0813   BILITOT 0.58 02/16/2017 0802   GFRNONAA 32 (L) 06/02/2024 0619   GFRNONAA 40 (L) 08/08/2023 0813   GFRAA >60 05/17/2018 0807    Imaging: VAS US  LOWER EXTREMITY VENOUS (DVT) (7a-5p) Result Date: 06/02/2024  Lower Venous DVT Study Patient Name:  Megan Fischer  Date of Exam:   06/02/2024 Medical Rec #: [id number]      Accession #:    [id number] Date of Birth: 11/20/1950     Patient  Gender: F Patient Age:   37 years Exam Location:  [facility] Procedure:      VAS US  LOWER  EXTREMITY VENOUS (DVT) Referring Phys: HAMP FONDAW --------------------------------------------------------------------------------  Indications: Swelling, Edema, and chest pain. Other Indications: H/O A-fib, CVA, left sided deficit. Limitations: Musculoskeletal features and patient position. Comparison Study: Previous study of the bilateral lower extremities on                   11.16.2025. Performing Technologist: Edilia Elden Appl  Examination Guidelines: A complete evaluation includes B-mode imaging, spectral Doppler, color Doppler, and power Doppler as needed of all accessible portions of each vessel. Bilateral testing is considered an integral part of a complete examination. Limited examinations for reoccurring indications may be performed as noted. The reflux portion of the exam is performed with the patient in reverse Trendelenburg.  +-----+---------------+---------+-----------+----------+--------------+ RIGHTCompressibilityPhasicitySpontaneityPropertiesThrombus Aging +-----+---------------+---------+-----------+----------+--------------+ CFV  Full           Yes      Yes                                 +-----+---------------+---------+-----------+----------+--------------+ SFJ  Full           Yes      Yes                                 +-----+---------------+---------+-----------+----------+--------------+   +---------+---------------+---------+-----------+----------+-------------------+ LEFT     CompressibilityPhasicitySpontaneityPropertiesThrombus Aging      +---------+---------------+---------+-----------+----------+-------------------+ CFV      Full           Yes      Yes                                      +---------+---------------+---------+-----------+----------+-------------------+ SFJ      Full           Yes      Yes                                       +---------+---------------+---------+-----------+----------+-------------------+ FV Prox  Full                                                             +---------+---------------+---------+-----------+----------+-------------------+ FV Mid   Full                                                             +---------+---------------+---------+-----------+----------+-------------------+ FV DistalFull                                                             +---------+---------------+---------+-----------+----------+-------------------+ PFV      Full                                                             +---------+---------------+---------+-----------+----------+-------------------+  POP      Full           Yes      Yes                                      +---------+---------------+---------+-----------+----------+-------------------+ PTV                                                   Not well visualized +---------+---------------+---------+-----------+----------+-------------------+ PERO                                                  Not well visualized +---------+---------------+---------+-----------+----------+-------------------+    Summary: RIGHT: - No evidence of common femoral vein obstruction.   LEFT: - There is no evidence of deep vein thrombosis in the lower extremity. However, portions of this examination were limited- see technologist comments above.  - No cystic structure found in the popliteal fossa.  *See table(s) above for measurements and observations.    Preliminary    DG Chest 2 View Result Date: 06/02/2024 EXAM: 2 VIEW(S) XRAY OF THE CHEST 06/02/2024 07:04:00 AM COMPARISON: 05/11/2024 CLINICAL HISTORY: 74 year old female. Persistent chest pain for 1 week radiating down the left arm. FINDINGS: LINES, TUBES AND DEVICES: Chronic cholecystectomy clips. Negative visible bowel gas. LUNGS AND PLEURA: Low lung volume. Elevated right  hemidiaphragm. No focal pulmonary opacity. No pleural effusion. No pneumothorax. HEART AND MEDIASTINUM: Aortic arch calcifications. No acute abnormality of the cardiac and mediastinal silhouettes. BONES AND SOFT TISSUES: Screw in right humeral head. IMPRESSION: 1. Low lung volumes with no acute cardiopulmonary abnormality. 2. Aortic Atherosclerosis (ICD10-I70.0). Electronically signed by: Helayne Hurst MD 06/02/2024 07:12 AM EST RP Workstation: [id number]     EKG: personally reviewed my interpretation is atrial fibrillation. Consistent with prior EKG.  ASSESSMENT & PLAN:   Assessment & Plan by Problem: Principal Problem:   AKI (acute kidney injury) Active Problems:   Atrial fibrillation (HCC)   Hypertension   DM (diabetes mellitus) type II controlled with renal manifestation (HCC)   Multinodular goiter   CKD (chronic kidney disease) stage 3, GFR 30-59 ml/min (HCC)   Hemiplegia and hemiparesis following cerebral infarction affecting left non-dominant side (HCC)   Chest pain   Urinary frequency   Megan Fischer is a 74 y.o. female with pertinent PMH of type 2 diabetes, A-fib on Eliquis , hypertension, prior pulmonary embolism 03/2024, pulmonary hypertension, CKD stage III, prior CVA with left-sided deficits, hyperthyroidism 2/2 multinodular goiter, and GERD who presented with chest pain and is admitted for further workup and monitoring with concomitant AKI.  AKI on CKD stage III Creatinine 1.69 up from 1.09 on 05/12/2024.  She does report increased urine output but no other UTI symptoms.  No signs of systemic infection.  No UA checked yet. - 500 mL liter LR bolus - UA - hold ARB today   Chest pain Overall most likely chest wall pain however she has several significant comorbidities including prior pulmonary embolism, prior stroke, atrial fibrillation, diabetes, and hypertension. She is adherent to anticoagulation and Wells score is 1.5. Troponin down trending without EKG changes. We will treat  her chest  pain as chest wall pain and ensure she has close follow-up with her cardiologist for potential stress test. - Tylenol  as needed, lidocaine  patch, Voltaren  gel - Repeat EKG, repeat troponins for any new or changing chest pain - PT/OT  HTN Afib Permanent afib without RVR here. Holding ARB as above for AKI.  - continue home amlodipine  5 mg daily, metoprolol  tartrate 25 mg BID, and eliquis  5 mg BID  T2DM No hypoglycemia or severe hyperglycemia. - Continue home lantus  11 units daily and sliding scale  Hyperthyroidism No signs of overt hypo/hyperthyroidism.  - continue home methimazole  10 mg daily  Prior CVA Left sided deficits appear stable on exam and rather she is globally mildly weak with onset 2 weeks ago. We will monitor for any new neuro deficits.  - continue aspirin , statin, and eliquis   Diet: Heart Healthy VTE: DOAC Code: DNR/DNI  Dispo: Admit patient to Observation with expected length of stay less than 2 midnights.  Signed: Fairy Pool, DO Internal Medicine Resident, PGY-3 Please contact the on call pager at [phone] for any urgent or emergent needs. 1:59 PM 06/02/2024 "

## 2024-06-02 NOTE — ED Provider Notes (Signed)
 " [facility] Provider Note   CSN: [id number] Arrival date & time: 06/02/24  9395     Patient presents with: Chest Pain   Megan Fischer is a 74 y.o. female.    Chest Pain  Patient is a 74 year old female with past medical history of multiple strokes who presents emergency department today with A-fib, hypertension, diabetes, CKD, pulmonary artery hypertension  She presents emergency room today with complaints of chest pain for the past 1 week She does not walk and is therefore uncertain about exertional component.  She is taking Eliquis , denies any mopped assist or unilateral leg swelling she states that her left leg is always bigger than her right.  She denies any nausea or vomiting.  Was given 1 dose of nitroglycerin  with EMS and had significant improvement in her chest pain.     Prior to Admission medications  Medication Sig Start Date End Date Taking? Authorizing Provider  acetaminophen  (TYLENOL ) 325 MG tablet Take 1-2 tablets (325-650 mg total) by mouth every 4 (four) hours as needed for mild pain (pain score 1-3). 01/31/24   Love, Sharlet RAMAN, PA-C  amLODipine  (NORVASC ) 5 MG tablet Take 1 tablet (5 mg total) by mouth daily. 05/20/24 05/20/25  Smucker, Melvenia, MD  apixaban  (ELIQUIS ) 5 MG TABS tablet Take 1 tablet (5 mg total) by mouth 2 (two) times daily. 05/06/24   Tobie Gaines, DO  aspirin  EC 81 MG tablet Take 1 tablet (81 mg total) by mouth daily. Swallow whole. 05/06/24   Tobie Gaines, DO  Blood Glucose Monitoring Suppl DEVI 1 each by Does not apply route as directed. Dispense based on patient and insurance preference. Use up to four times daily as directed. (FOR ICD-10 E10.9, E11.9). 02/28/24   Amilibia, Jaden, DO  Blood Pressure Monitoring (OMRON 3 SERIES BP MONITOR) DEVI Monitor blood pressure daily. 05/15/24   Lucien Orren SAILOR, PA-C  camphor-menthol  [facility]) lotion Apply topically as needed for itching. 02/28/24   Amilibia, Jaden, DO   Continuous Glucose Sensor (FREESTYLE LIBRE 3 PLUS SENSOR) MISC Change sensor every 15 days. 05/20/24   Smucker, Melvenia, MD  diclofenac  Sodium (VOLTAREN ) 1 % GEL Apply 2 g topically 4 (four) times daily. To both knees 02/28/24   Amilibia, Jaden, DO  ezetimibe  (ZETIA ) 10 MG tablet Take 1 tablet (10 mg total) by mouth daily. 05/06/24   Tobie Gaines, DO  famotidine  (PEPCID ) 20 MG tablet Take 1 tablet (20 mg total) by mouth daily. 04/10/24   Norrine Sharper, MD  Glucose Blood (BLOOD GLUCOSE TEST STRIPS) STRP 1 each by Does not apply route as directed. Dispense based on patient and insurance preference. Use up to four times daily as directed. (FOR ICD-10 E10.9, E11.9). 02/28/24   Amilibia, Jaden, DO  HYDROcodone -acetaminophen  (NORCO/VICODIN) 5-325 MG tablet Take 2 tablets by mouth every 6 (six) hours as needed for moderate pain (pain score 4-6) or severe pain (pain score 7-10). 05/11/24   Ula Prentice SAUNDERS, MD  insulin  glargine (LANTUS ) 100 UNIT/ML Solostar Pen Inject 11 Units into the skin daily. 05/06/24   Tobie Gaines, DO  Insulin  Pen Needle 32G X 4 MM MISC Use daily at 6am with insulin  pen. 04/01/24   Maurice Sharlet RAMAN, PA-C  Lancet Device MISC 1 each by Does not apply route as directed. Dispense based on patient and insurance preference. Use up to four times daily as directed. (FOR ICD-10 E10.9, E11.9). 02/28/24   Amilibia, Jaden, DO  Lancets MISC 1 each by Does  not apply route as directed. Dispense based on patient and insurance preference. Use up to four times daily as directed. (FOR ICD-10 E10.9, E11.9). 02/28/24   Amilibia, Jaden, DO  levETIRAcetam  (KEPPRA ) 500 MG tablet Take 1 tablet (500 mg total) by mouth 2 (two) times daily. 05/11/24   Melvenia Motto, MD  lidocaine  (LIDODERM ) 5 % Place 2 patches onto the skin daily. Remove & Discard patch within 12 hours or as directed by MD 02/14/24   Love, Sharlet RAMAN, PA-C  magnesium  oxide (MAG-OX) 400 MG tablet Take 1 tablet (400 mg total) by mouth 2 (two) times daily.  05/11/24   Ula Prentice SAUNDERS, MD  melatonin 5 MG TABS Take 1 tablet (5 mg total) by mouth at bedtime as needed. 04/01/24   Love, Sharlet RAMAN, PA-C  methimazole  (TAPAZOLE ) 10 MG tablet Take 1 tablet (10 mg total) by mouth daily. 05/06/24   Tobie Gaines, DO  metoprolol  tartrate (LOPRESSOR ) 25 MG tablet Take 1 tablet (25 mg total) by mouth 2 (two) times daily. 05/20/24   Azadegan, Maryam, MD  Multiple Vitamins-Minerals (MULTIVITAMIN WITH MINERALS) tablet Take 1 tablet by mouth daily.    [provider]  olmesartan  (BENICAR ) 40 MG tablet Take 1 tablet (40 mg total) by mouth daily. 05/15/24   Conte, Tessa N, PA-C  polyethylene glycol (MIRALAX  / GLYCOLAX ) 17 g packet Take 17 g by mouth daily as needed for mild constipation. 03/29/24   Harrie Bruckner, DO  rosuvastatin  (CRESTOR ) 20 MG tablet Take 1 tablet (20 mg total) by mouth daily after supper. 04/10/24   Norrine Sharper, MD  senna (SENOKOT) 8.6 MG TABS tablet Take 2 tablets (17.2 mg total) by mouth daily. 04/01/24   Love, Sharlet RAMAN, PA-C  sertraline  (ZOLOFT ) 25 MG tablet Take 1 tablet (25 mg total) by mouth daily. 05/20/24 08/18/24  Smucker, Melvenia, MD    Allergies: Aspirin , Codeine, Fentanyl , Oxycodone hcl, Penicillins, Tape, Tomato, and Chlorhexidine gluconate    Review of Systems  Cardiovascular:  Positive for chest pain.    Updated Vital Signs BP 125/72   Pulse 71   Temp 97.9 F (36.6 C) (Oral)   Resp 16   Ht 5' 3 (1.6 m)   Wt 77.1 kg   SpO2 97%   BMI 30.11 kg/m   Physical Exam Vitals and nursing note reviewed.  Constitutional:      General: She is not in acute distress. HENT:     Head: Normocephalic and atraumatic.     Nose: Nose normal.     Mouth/Throat:     Mouth: Mucous membranes are dry.  Eyes:     General: No scleral icterus. Cardiovascular:     Rate and Rhythm: Normal rate and regular rhythm.     Pulses: Normal pulses.     Heart sounds: Normal heart sounds.  Pulmonary:     Effort: Pulmonary effort is normal. No  respiratory distress.     Breath sounds: No wheezing.  Abdominal:     Palpations: Abdomen is soft.     Tenderness: There is no abdominal tenderness.  Musculoskeletal:     Cervical back: Normal range of motion.     Comments: Left lower extremity slightly larger than right  Skin:    General: Skin is warm and dry.     Capillary Refill: Capillary refill takes less than 2 seconds.  Neurological:     Mental Status: She is alert. Mental status is at baseline.  Psychiatric:        Mood and Affect:  Mood normal.        Behavior: Behavior normal.     (all labs ordered are listed, but only abnormal results are displayed) Labs Reviewed  BASIC METABOLIC PANEL WITH GFR - Abnormal; Notable for the following components:      Result Value   CO2 20 (*)    Glucose, Bld 191 (*)    BUN 73 (*)    Creatinine, Ser 1.69 (*)    GFR, Estimated 32 (*)    All other components within normal limits  CBC - Abnormal; Notable for the following components:   RDW 15.9 (*)    All other components within normal limits  CBG MONITORING, ED - Abnormal; Notable for the following components:   Glucose-Capillary 172 (*)    All other components within normal limits  TROPONIN T, HIGH SENSITIVITY - Abnormal; Notable for the following components:   Troponin T High Sensitivity 39 (*)    All other components within normal limits  TROPONIN T, HIGH SENSITIVITY - Abnormal; Notable for the following components:   Troponin T High Sensitivity 37 (*)    All other components within normal limits  URINALYSIS, ROUTINE W REFLEX MICROSCOPIC    EKG: EKG Interpretation Date/Time:  Monday June 02 2024 06:09:18 EST Ventricular Rate:  82 PR Interval:    QRS Duration:  109 QT Interval:  380 QTC Calculation: 444 R Axis:   -11  Text Interpretation: Atrial fibrillation Anterior infarct, old Baseline wander in lead(s) V3 Confirmed by Levander Houston [phone]) on 06/02/2024 9:54:58 AM  Radiology: VAS US  LOWER EXTREMITY VENOUS (DVT)  (7a-5p) Result Date: 06/02/2024  Lower Venous DVT Study Patient Name:  CHAYA DEHAAN  Date of Exam:   06/02/2024 Medical Rec #: [id number]      Accession #:    [id number] Date of Birth: May 17, 1950     Patient Gender: F Patient Age:   56 years Exam Location:  [facility] Procedure:      VAS US  LOWER EXTREMITY VENOUS (DVT) Referring Phys: HAMP Kennede Lusk --------------------------------------------------------------------------------  Indications: Swelling, Edema, and chest pain. Other Indications: H/O A-fib, CVA, left sided deficit. Limitations: Musculoskeletal features and patient position. Comparison Study: Previous study of the bilateral lower extremities on                   11.16.2025. Performing Technologist: Edilia Elden Appl  Examination Guidelines: A complete evaluation includes B-mode imaging, spectral Doppler, color Doppler, and power Doppler as needed of all accessible portions of each vessel. Bilateral testing is considered an integral part of a complete examination. Limited examinations for reoccurring indications may be performed as noted. The reflux portion of the exam is performed with the patient in reverse Trendelenburg.  +-----+---------------+---------+-----------+----------+--------------+ RIGHTCompressibilityPhasicitySpontaneityPropertiesThrombus Aging +-----+---------------+---------+-----------+----------+--------------+ CFV  Full           Yes      Yes                                 +-----+---------------+---------+-----------+----------+--------------+ SFJ  Full           Yes      Yes                                 +-----+---------------+---------+-----------+----------+--------------+   +---------+---------------+---------+-----------+----------+-------------------+ LEFT     CompressibilityPhasicitySpontaneityPropertiesThrombus Aging      +---------+---------------+---------+-----------+----------+-------------------+ CFV      Full  Yes       Yes                                      +---------+---------------+---------+-----------+----------+-------------------+ SFJ      Full           Yes      Yes                                      +---------+---------------+---------+-----------+----------+-------------------+ FV Prox  Full                                                             +---------+---------------+---------+-----------+----------+-------------------+ FV Mid   Full                                                             +---------+---------------+---------+-----------+----------+-------------------+ FV DistalFull                                                             +---------+---------------+---------+-----------+----------+-------------------+ PFV      Full                                                             +---------+---------------+---------+-----------+----------+-------------------+ POP      Full           Yes      Yes                                      +---------+---------------+---------+-----------+----------+-------------------+ PTV                                                   Not well visualized +---------+---------------+---------+-----------+----------+-------------------+ PERO                                                  Not well visualized +---------+---------------+---------+-----------+----------+-------------------+     Summary: RIGHT: - No evidence of common femoral vein obstruction.   LEFT: - There is no evidence of deep vein thrombosis in the lower extremity. However, portions of this examination were limited- see technologist comments above.  - No cystic structure found in the popliteal fossa.  *See table(s) above for measurements and observations.  Electronically signed by Fonda Rim on 06/02/2024 at 2:39:21 PM.    Final    DG Chest 2 View Result Date: 06/02/2024 EXAM: 2 VIEW(S) XRAY OF THE CHEST 06/02/2024 07:04:00 AM  COMPARISON: 05/11/2024 CLINICAL HISTORY: 74 year old female. Persistent chest pain for 1 week radiating down the left arm. FINDINGS: LINES, TUBES AND DEVICES: Chronic cholecystectomy clips. Negative visible bowel gas. LUNGS AND PLEURA: Low lung volume. Elevated right hemidiaphragm. No focal pulmonary opacity. No pleural effusion. No pneumothorax. HEART AND MEDIASTINUM: Aortic arch calcifications. No acute abnormality of the cardiac and mediastinal silhouettes. BONES AND SOFT TISSUES: Screw in right humeral head. IMPRESSION: 1. Low lung volumes with no acute cardiopulmonary abnormality. 2. Aortic Atherosclerosis (ICD10-I70.0). Electronically signed by: Helayne Hurst MD 06/02/2024 07:12 AM EST RP Workstation: [id number]     Procedures   Medications Ordered in the ED  insulin  aspart (novoLOG ) injection 0-9 Units (2 Units Subcutaneous Given 06/02/24 1304)  acetaminophen  (TYLENOL ) tablet 325-650 mg (has no administration in time range)  apixaban  (ELIQUIS ) tablet 5 mg (5 mg Oral Given 06/02/24 1259)  aspirin  EC tablet 81 mg (81 mg Oral Given 06/02/24 1259)  levETIRAcetam  (KEPPRA ) tablet 500 mg (500 mg Oral Given 06/02/24 1259)  methimazole  (TAPAZOLE ) tablet 10 mg (10 mg Oral Given 06/02/24 1259)  diclofenac  Sodium (VOLTAREN ) 1 % topical gel 2 g (has no administration in time range)  lidocaine  (LIDODERM ) 5 % 1 patch (has no administration in time range)  lactated ringers  bolus 500 mL (has no administration in time range)  amLODipine  (NORVASC ) tablet 5 mg (has no administration in time range)  camphor-menthol  (SARNA) lotion (has no administration in time range)  ezetimibe  (ZETIA ) tablet 10 mg (has no administration in time range)  famotidine  (PEPCID ) tablet 20 mg (has no administration in time range)  sertraline  (ZOLOFT ) tablet 25 mg (has no administration in time range)  senna (SENOKOT) tablet 17.2 mg (has no administration in time range)  rosuvastatin  (CRESTOR ) tablet 20 mg (has no administration in time  range)  polyethylene glycol (MIRALAX  / GLYCOLAX ) packet 17 g (has no administration in time range)  metoprolol  tartrate (LOPRESSOR ) tablet 25 mg (has no administration in time range)  melatonin tablet 5 mg (has no administration in time range)  insulin  glargine-yfgn (SEMGLEE ) injection 11 Units (has no administration in time range)  multivitamin with minerals tablet 1 tablet (has no administration in time range)  acetaminophen  (TYLENOL ) tablet 1,000 mg (1,000 mg Oral Given 06/02/24 0943)  famotidine  (PEPCID ) IVPB 20 mg premix (0 mg Intravenous Stopped 06/02/24 1029)    Clinical Course as of 06/02/24 1540  Mon Jun 02, 2024  0732 Two weeks of CP  Worse last night.   No SOB. No nausea or vomiting.   [WF]  0735 LLE always been larger [WF]    Clinical Course User Index [WF] Neldon Hamp RAMAN, PA                                 Medical Decision Making Amount and/or Complexity of Data Reviewed Labs: ordered. Radiology: ordered.  Risk OTC drugs. Prescription drug management. Decision regarding hospitalization.   This patient presents to the ED for concern of CP, this involves a number of treatment options, and is a complaint that carries with it a high risk of complications and morbidity. A differential diagnosis was considered for the patient's symptoms which is discussed below:   The emergent causes of chest pain include: Acute coronary syndrome,  tamponade, pericarditis/myocarditis, aortic dissection, pulmonary embolism, tension pneumothorax, pneumonia, and esophageal rupture.     Co morbidities: Discussed in HPI   Brief History:  Patient is a 74 year old female with past medical history of multiple strokes who presents emergency department today with A-fib, hypertension, diabetes, CKD, pulmonary artery hypertension  She presents emergency room today with complaints of chest pain for the past 1 week She does not walk and is therefore uncertain about exertional component.   She is taking Eliquis , denies any mopped assist or unilateral leg swelling she states that her left leg is always bigger than her right.  She denies any nausea or vomiting.  Was given 1 dose of nitroglycerin  with EMS and had significant improvement in her chest pain.   EMR reviewed including pt PMHx, past surgical history and past visits to ER.   See HPI for more details   Lab Tests:/Imaging studies   I ordered and independently interpreted labs. Labs notable for Troponins elevated but flat, AKI, DVT study negative, chest x-ray unremarkable     Cardiac Monitoring:  The patient was maintained on a cardiac monitor.  I personally viewed and interpreted the cardiac monitored which showed an underlying rhythm of: Atrial fibrillation EKG non-ischemic A-fib   Medicines ordered:  I ordered medication including Pepcid  Tylenol  for pain Reevaluation of the patient after these medicines showed that the patient improved I have reviewed the patients home medicines and have made adjustments as needed   Critical Interventions:     Consults/Attending Physician   I discussed this case with my attending physician who cosigned this note including patient's presenting symptoms, physical exam, and planned diagnostics and interventions. Attending physician stated agreement with plan or made changes to plan which were implemented.   Reevaluation:  After the interventions noted above I re-evaluated patient and found that they have : Stayed the same   Social Determinants of Health:      Problem List / ED Course:  AKI and vague chest pain she has numerous risk factors with her chest pain it did improve nitroglycerin  I do think this warrants some monitoring.  Especially with elevated troponins although they are flat.  Seems that she has not had an ischemic workup in the past.  Admitted to hospitalist.   Dispostion:  After consideration of the diagnostic results and the patients  response to treatment, I feel that the patent would benefit from admission.    Final diagnoses:  Nonspecific chest pain  AKI (acute kidney injury)    ED Discharge Orders     None          Neldon Hamp RAMAN, [place] 06/02/24 1541  "

## 2024-06-02 NOTE — Evaluation (Signed)
 Occupational Therapy Evaluation Patient Details Name: Megan Fischer MRN: [id number] DOB: 07/09/1950 Today's Date: 06/02/2024   History of Present Illness   Megan Fischer is a 74 year old female who presents to the emergency room today with complaints of chest pain for the past week.  No significant issues found with workup except AKI.  Past medical history of multiple strokes, PE,  A-fib, hypertension, diabetes, CKD, pulmonary artery hypertension.     Clinical Impressions Pt currently at mod assist overall for toilet transfers with mod to max for LB selfcare and toileting tasks sit to stand.  Pt reports living with her son and daughter-in-law and they provide assist with all transfers, mobility, and ADLs.  She has primarily been using a wheelchair most of the time at home.  Left visual field deficit is still present from prior CVA as well as left residual weakness.  Feel she will benefit from acute care OT at this time in order to progress ADL independence as well as reduce caregiver burden for all transfers.  Recommend HHOT at discharge to further progression.       If plan is discharge home, recommend the following:   A lot of help with walking and/or transfers;A lot of help with bathing/dressing/bathroom;Direct supervision/assist for medications management;Help with stairs or ramp for entrance;Assistance with cooking/housework     Functional Status Assessment   Patient has had a recent decline in their functional status and demonstrates the ability to make significant improvements in function in a reasonable and predictable amount of time.     Equipment Recommendations   None recommended by OT      Precautions/Restrictions   Precautions Precautions: Fall Restrictions Weight Bearing Restrictions Per Provider Order: No     Mobility Bed Mobility Overal bed mobility: Needs Assistance Bed Mobility: Supine to Sit     Supine to sit: Min assist     General bed mobility  comments: Assist with scooting hips to the edge when getting up and for bringing LEs back in the bed when laying down.    Transfers Overall transfer level: Needs assistance Equipment used: None Transfers: Sit to/from Stand, Bed to chair/wheelchair/BSC Sit to Stand: Mod assist     Step pivot transfers: Mod assist     General transfer comment: Decreased ability to move the LLE with stepping, slight circumduction noted.      Balance Overall balance assessment: Needs assistance Sitting-balance support: Feet supported, Bilateral upper extremity supported Sitting balance-Leahy Scale: Fair     Standing balance support: During functional activity, Bilateral upper extremity supported Standing balance-Leahy Scale: Poor Standing balance comment: Pt needs mod assist for standing balance during toileting tasks and for transfers                           ADL either performed or assessed with clinical judgement   ADL Overall ADL's : Needs assistance/impaired Eating/Feeding: Supervision/ safety;Sitting   Grooming: Wash/dry hands;Wash/dry face;Sitting;Set up   Upper Body Bathing: Supervision/ safety;Sitting   Lower Body Bathing: Maximal assistance;Sit to/from stand   Upper Body Dressing : Supervision/safety;Sitting   Lower Body Dressing: Maximal assistance;Sit to/from stand   Toilet Transfer: Moderate assistance;Stand-pivot   Toileting- Clothing Manipulation and Hygiene: Maximal assistance;Sit to/from stand       Functional mobility during ADLs: Moderate assistance (take steps up the EOB with therapist facilitation from the front) General ADL Comments: Pt reports assist with all transfers and self care from her son and daughter-in-law since last  CVA.  She uses a wheelchair around the house mostly and transfers to different surfaces.  Her son will walk heer into the bathroom to transfer on a tub bench but it's sitting out side the tub per her report.  VSS throughout session  with O2 at 99-100% on room air and BP in sitting 115/76.  HR around 84-90 BPM at rest but increasing up to 127 BPM with standing.     Vision Baseline Vision/History:  (left visual field cut since CVAs) Ability to See in Adequate Light: 0 Adequate Patient Visual Report: Peripheral vision impairment Vision Assessment?: Yes Eye Alignment: Within Functional Limits Ocular Range of Motion: Within Functional Limits Alignment/Gaze Preference: Within Defined Limits Tracking/Visual Pursuits: Able to track stimulus in all quads without difficulty Visual Fields: Left visual field deficit     Perception Perception: Within Functional Limits       Praxis Praxis: WFL       Pertinent Vitals/Pain Pain Assessment Pain Assessment: No/denies pain     Extremity/Trunk Assessment Upper Extremity Assessment Upper Extremity Assessment: RUE deficits/detail;LUE deficits/detail RUE Deficits / Details: Shoulder flexion AAROM 0-80 degrees with crepitus noted.  Elbow and hand AROM WFLs with strength 4/5. RUE: Shoulder pain with ROM RUE Sensation: WNL RUE Coordination: decreased gross motor LUE Deficits / Details: Shoulder flexion AAROM 0-130 degrees with slight shoulder pain.  Elbow AROM WFLs with strength 4/5,  grip strength 4/5.  Slight decreased FM coordination noted compared to the right hand with finger to thumb oppostion. LUE: Shoulder pain with ROM LUE Sensation: WNL LUE Coordination: decreased gross motor   Lower Extremity Assessment Lower Extremity Assessment: Defer to PT evaluation   Cervical / Trunk Assessment Cervical / Trunk Assessment: Normal   Communication Communication Communication: No apparent difficulties   Cognition Arousal: Alert Behavior During Therapy: WFL for tasks assessed/performed Cognition: No apparent impairments             OT - Cognition Comments: Pt able to state prior history of CVA with right deficits and right visual field deficits.                  Following commands: Intact       Cueing  General Comments   Cueing Techniques: Verbal cues              Home Living Family/patient expects to be discharged to:: Private residence Living Arrangements: Children (son and his spouse) Available Help at Discharge: Family;Available 24 hours/day Type of Home: Apartment Home Access: Level entry     Home Layout: One level     Bathroom Shower/Tub: Tub/shower unit;Curtain   Firefighter: Standard     Home Equipment: Grab bars - tub/shower;BSC/3in1;Wheelchair - manual;Rollator (4 wheels);Rolling Walker (2 wheels);Cane - quad;Tub bench (hurry cane not really quad but has legs)          Prior Functioning/Environment Prior Level of Function : Independent/Modified Independent             Mobility Comments: Uses wheelchair more now since stroke and her son helps her get in and out of the bed.  Son helps walk her from the bathroom door onto the shower seat. ADLs Comments: son's wife assists with ADLs, sponge bathes with assistance sitting on the seat outside of the tub per her report.  They help with all LB bathing and dressing.    OT Problem List: Decreased strength;Decreased knowledge of use of DME or AE;Impaired vision/perception;Pain;Impaired balance (sitting and/or standing);Decreased activity tolerance;Decreased range of motion;Impaired UE  functional use   OT Treatment/Interventions: Self-care/ADL training;Patient/family education;Balance training;Therapeutic exercise;Neuromuscular education;Therapeutic activities;DME and/or AE instruction      OT Goals(Current goals can be found in the care plan section)   Acute Rehab OT Goals Patient Stated Goal: Pt wants to get to where she can walk better OT Goal Formulation: With patient Time For Goal Achievement: 06/16/24 Potential to Achieve Goals: Good   OT Frequency:  Min 2X/week       AM-PAC OT 6 Clicks Daily Activity     Outcome Measure Help from another person  eating meals?: A Little Help from another person taking care of personal grooming?: A Little Help from another person toileting, which includes using toliet, bedpan, or urinal?: A Lot Help from another person bathing (including washing, rinsing, drying)?: A Lot Help from another person to put on and taking off regular upper body clothing?: A Little Help from another person to put on and taking off regular lower body clothing?: A Lot 6 Click Score: 15   End of Session Nurse Communication: Mobility status  Activity Tolerance: Patient tolerated treatment well Patient left: in bed;with call bell/phone within reach  OT Visit Diagnosis: Unsteadiness on feet (R26.81);Muscle weakness (generalized) (M62.81);Hemiplegia and hemiparesis Hemiplegia - Right/Left: Left Hemiplegia - caused by: Cerebral infarction                Time: 8469-8387 OT Time Calculation (min): 42 min Charges:  OT General Charges $OT Visit: 1 Visit OT Evaluation $OT Eval Moderate Complexity: 1 Mod OT Treatments $Self Care/Home Management : 23-37 mins  Lynwood Constant, OTR/L Acute Rehabilitation Services  Office [phone] 06/02/2024

## 2024-06-03 DIAGNOSIS — I129 Hypertensive chronic kidney disease with stage 1 through stage 4 chronic kidney disease, or unspecified chronic kidney disease: Secondary | ICD-10-CM | POA: Diagnosis not present

## 2024-06-03 DIAGNOSIS — R0789 Other chest pain: Secondary | ICD-10-CM | POA: Diagnosis not present

## 2024-06-03 DIAGNOSIS — N179 Acute kidney failure, unspecified: Secondary | ICD-10-CM | POA: Diagnosis not present

## 2024-06-03 DIAGNOSIS — E1122 Type 2 diabetes mellitus with diabetic chronic kidney disease: Secondary | ICD-10-CM | POA: Diagnosis not present

## 2024-06-03 LAB — URINALYSIS, ROUTINE W REFLEX MICROSCOPIC
Bilirubin Urine: NEGATIVE
Glucose, UA: NEGATIVE mg/dL
Hgb urine dipstick: NEGATIVE
Ketones, ur: NEGATIVE mg/dL
Leukocytes,Ua: NEGATIVE
Nitrite: NEGATIVE
Protein, ur: 30 mg/dL — AB
Specific Gravity, Urine: 1.021 (ref 1.005–1.030)
pH: 5 (ref 5.0–8.0)

## 2024-06-03 LAB — BASIC METABOLIC PANEL WITH GFR
Anion gap: 12 (ref 5–15)
BUN: 58 mg/dL — ABNORMAL HIGH (ref 8–23)
CO2: 19 mmol/L — ABNORMAL LOW (ref 22–32)
Calcium: 9.9 mg/dL (ref 8.9–10.3)
Chloride: 111 mmol/L (ref 98–111)
Creatinine, Ser: 1.31 mg/dL — ABNORMAL HIGH (ref 0.44–1.00)
GFR, Estimated: 43 mL/min — ABNORMAL LOW
Glucose, Bld: 137 mg/dL — ABNORMAL HIGH (ref 70–99)
Potassium: 4.7 mmol/L (ref 3.5–5.1)
Sodium: 141 mmol/L (ref 135–145)

## 2024-06-03 LAB — CBC
HCT: 39.6 % (ref 36.0–46.0)
Hemoglobin: 12.8 g/dL (ref 12.0–15.0)
MCH: 29.6 pg (ref 26.0–34.0)
MCHC: 32.3 g/dL (ref 30.0–36.0)
MCV: 91.7 fL (ref 80.0–100.0)
Platelets: 165 10*3/uL (ref 150–400)
RBC: 4.32 MIL/uL (ref 3.87–5.11)
RDW: 15.8 % — ABNORMAL HIGH (ref 11.5–15.5)
WBC: 4.5 10*3/uL (ref 4.0–10.5)
nRBC: 0 % (ref 0.0–0.2)

## 2024-06-03 LAB — CBG MONITORING, ED
Glucose-Capillary: 127 mg/dL — ABNORMAL HIGH (ref 70–99)
Glucose-Capillary: 135 mg/dL — ABNORMAL HIGH (ref 70–99)
Glucose-Capillary: 163 mg/dL — ABNORMAL HIGH (ref 70–99)

## 2024-06-03 NOTE — ED Notes (Signed)
 [facility] called ETA within an hour 11:57am

## 2024-06-03 NOTE — Discharge Instructions (Signed)
 Thank you for allowing us  to be part of your care. You were hospitalized for Chest pain (workup did not show any concerns with your heart, it is likely you are having muscular pain that was treated with lidocaine  patch, Tylenol , and voltaren  gel. You additionally presented with increased kidney number which is likely due to not drinking enough water . Your Kidney number improved with IV fluids. We will keep a close on it in the clinic.   See the changes in your medications and management of your chronic conditions below:  *For your Chest Pain  -We have STARTED you on these following medications:  - Lidocaine  Patch  - Voltaren  Gel   - Tylenol  325-650 mg every 4 hours as needed       *For your Acute Kidney Injury -We have PAUSED the following medications:  - Olmesartan  40 mg daily (wait until you are seen back in the clinic) - Continue hydrating well, at least 4 cups of water  a day     *For your Blood Pressure, Atrial Fibrillation, and High cholesterol  -CONTINUE:             - Eliquis  5 mg twice daily   - Zetia  10 mg daily   - Lopressor  25 mg two times daily  - Crestor  20 mg daily             - Zetia  10 mg daily   -We have PAUSED the following medications:  - Olmesartan  40 mg daily (Do not resume until seen back in clinic) - monitor your blood pressures at home, if you have new headache, change in vision, dizziness, or any questions or concerns, please call the clinic.   FOLLOW UP APPOINTMENTS: You should be contacted regarding follow up clinic appointment. If you do not hear back by the end of the week, please call the clinic to schedule and be seen next week.   Please call your PCP or our clinic if you have any questions or concerns, we may be able to help and keep you from a long and expensive emergency room wait. Our clinic and after hours phone number is [phone]. The best time to call is Monday through Friday 9 am to 4 pm but there is always someone available 24/7 if you have  an emergency. If you need medication refills please notify your pharmacy one week in advance and they will send us  a request.   We are glad you are feeling better,  Sallyanne Primas Internal Medicine Inpatient Teaching Service at [facility]

## 2024-06-03 NOTE — ED Notes (Signed)
 When you have time, Mychaela Lennartz (son) [phone] would like update on pt. Status. Thank you

## 2024-06-03 NOTE — ED Notes (Addendum)
 Pt. Requested that her IV be taken out. Told her that we might need it to give her meds and draw blood. She still wanted it removed. Pt is also refusing to have vitals monitored.

## 2024-06-03 NOTE — Care Management Obs Status (Signed)
 MEDICARE OBSERVATION STATUS NOTIFICATION   Patient Details  Name: Megan Fischer MRN: [id number] Date of Birth: Jun 13, 1950   Medicare Observation Status Notification Given:  Yes    Debarah Saunas, RN 06/03/2024, 7:58 AM

## 2024-06-03 NOTE — Discharge Planning (Signed)
 Transition of Care [facility]) - Inpatient Brief Assessment   Patient Details  Name: Megan Fischer MRN: [id number] Date of Birth: June 13, 1950  [facility]) CM/SW Contact:    Debarah Saunas, RN Phone Number: 06/03/2024, 8:01 AM   Clinical Narrative: Inpatient Care Management (ICM) has reviewed patient at bedside and no ICM needs have been identified at this time. We will continue to monitor patient advancement through interdisciplinary progression rounds. If new patient transition needs arise, please place a ICM consult.    Transition of Care Asessment: Insurance and Status: Insurance coverage has been reviewed Patient has primary care physician: Yes (Azadegan, Maryam, MD) Home environment has been reviewed: from home (apartment) with son Prior level of function:: modified dependent- performs personal care,needs help with transfers amd mobility. Home DME cane, walker, wheelchair Prior/Current Home Services: No current home services Social Drivers of Health Review: SDOH reviewed no interventions necessary Readmission risk has been reviewed: No Transition of care needs: no transition of care needs at this time

## 2024-06-03 NOTE — Discharge Planning (Signed)
 RNCM spoke with pt at bedside regarding discharge planning for Home Health Services. Offered pt medicare.gov list of home health agencies to choose from.  Pt chose [facility] to render PT and OT services. Artayvia, liaison of [facility] notified. Patient made aware that [facility] will be in contact in 24-48 hours. HH agency information placed on After Visit Summary (AVS) for pt to contact with any home health questions after discharge. No DME needs identified at this time. Rabiah Goeser J. Debarah, BSN, RN, [facility]TA[facility] [phone]

## 2024-06-03 NOTE — Evaluation (Signed)
 Physical Therapy Brief Evaluation and Discharge Note Patient Details Name: Megan Fischer MRN: [id number] DOB: 02-26-1951 Today's Date: 06/03/2024   History of Present Illness  Megan Fischer is a 74 year old female who presents to the emergency room today with complaints of chest pain for the past week.  No significant issues found with workup except AKI.  Past medical history of multiple strokes, PE,  A-fib, hypertension, diabetes, CKD, pulmonary artery hypertension.  Clinical Impression  Pt at or close to her baseline with mobility. Has had HHPT in the past and would like to have PT again. Pt ready for home with supportive family from PT standpoint.        PT Assessment All further PT needs can be met in the next venue of care  Assistance Needed at Discharge  Intermittent Supervision/Assistance    Equipment Recommendations None recommended by PT  Recommendations for Other Services       Precautions/Restrictions Precautions Precautions: Fall Restrictions Weight Bearing Restrictions Per Provider Order: No        Mobility  Bed Mobility   Supine/Sidelying to sit: Supervision, HOB elevated Sit to supine/sidelying: Min assist General bed mobility comments: Assist to bring legs back up onto stretcher  Transfers Overall transfer level: Needs assistance Equipment used: None, Rolling walker (2 wheels) Transfers: Sit to/from Stand, Bed to chair/wheelchair/BSC Sit to Stand: Min assist, Mod assist   Step pivot transfers: Min assist       General transfer comment: Sit to stand from high stretcher with min assist and pivot to chair without assistive device. Stood from chair with walker with mod assist to power up from low chair. Chair to bed with walker with CGA.    Ambulation/Gait Ambulation/Gait assistance: Min assist Gait Distance (Feet): 15 Feet Assistive device: Rolling walker (2 wheels) Gait Pattern/deviations: Step-to pattern, Decreased stance time - left Gait Speed:  Below normal General Gait Details: Circumduction of LLE. Lt ankle with eversion.  Home Activity Instructions    Stairs            Modified Rankin (Stroke Patients Only)        Balance Overall balance assessment: Needs assistance Sitting-balance support: Feet supported, No upper extremity supported Sitting balance-Leahy Scale: Fair     Standing balance support: Bilateral upper extremity supported, During functional activity Standing balance-Leahy Scale: Poor Standing balance comment: UE support for balance          Pertinent Vitals/Pain PT - Brief Vital Signs All Vital Signs Stable: Yes Pain Assessment Pain Assessment: No/denies pain     Home Living Family/patient expects to be discharged to:: Private residence Living Arrangements: Children (son and dtr in social worker) Available Help at Discharge: Family;Available 24 hours/day Home Environment: Level entry   Home Equipment: Grab bars - tub/shower;BSC/3in1;Wheelchair - manual;Rollator (4 wheels);Rolling Walker (2 wheels);Cane - quad;Tub bench;Hospital bed        Prior Function Level of Independence: Needs assistance Comments: Primarily w/c level with assist from family for transfers. Amb short distance into the bathroom since w/c won't fit in bathroom    UE/LE Assessment   UE ROM/Strength/Tone/Coordination:  (defer to OT)    LE ROM/Strength/Tone/Coordination: Impaired LE ROM/Strength/Tone/Coordination Deficits: Chronic lt weakness from CVA    Communication   Communication Communication: No apparent difficulties     Cognition Overall Cognitive Status: Appears within functional limits for tasks assessed/performed       General Comments      Exercises     Assessment/Plan    PT Problem List Decreased  strength;Decreased mobility;Decreased balance       PT Visit Diagnosis Other abnormalities of gait and mobility (R26.89);Difficulty in walking, not elsewhere classified (R26.2)    No Skilled PT      Co-evaluation                AMPAC 6 Clicks Help needed turning from your back to your side while in a flat bed without using bedrails?: None Help needed moving from lying on your back to sitting on the side of a flat bed without using bedrails?: A Little Help needed moving to and from a bed to a chair (including a wheelchair)?: A Little Help needed standing up from a chair using your arms (e.g., wheelchair or bedside chair)?: A Lot Help needed to walk in hospital room?: Total Help needed climbing 3-5 steps with a railing? : Total 6 Click Score: 14      End of Session Equipment Utilized During Treatment: Gait belt Activity Tolerance: Patient tolerated treatment well Patient left: in bed;with call bell/phone within reach   PT Visit Diagnosis: Other abnormalities of gait and mobility (R26.89);Difficulty in walking, not elsewhere classified (R26.2)     Time: 1049-1110 PT Time Calculation (min) (ACUTE ONLY): 21 min  Charges:   PT Evaluation $PT Eval Moderate Complexity: 1 Mod      [facility] PT Acute Rehabilitation Services Office [phone]   Rodgers ORN [facility]  06/03/2024, 11:32 AM

## 2024-06-03 NOTE — Discharge Summary (Signed)
 "  Name: Megan Fischer MRN: [id number] DOB: 12-Sep-1950 74 y.o. PCP: Bernadine Manos, MD  Date of Admission: 06/02/2024  6:04 AM Date of Discharge: 06/03/2024 Attending Physician: Dr. Reyes Fenton  Discharge Diagnosis: 1. Principal Problem:   AKI (acute kidney injury) Active Problems:   Atrial fibrillation (HCC)   Hypertension   DM (diabetes mellitus) type II controlled with renal manifestation (HCC)   Multinodular goiter   CKD (chronic kidney disease) stage 3, GFR 30-59 ml/min (HCC)   Hemiplegia and hemiparesis following cerebral infarction affecting left non-dominant side (HCC)   Nonspecific chest pain   Urinary frequency   Type 2 diabetes mellitus with other specified complication [facility])   Discharge Medications: Allergies as of 06/03/2024       Reactions   Aspirin  Nausea Only   Patient stated to neuro that it caused an upset stomach   Codeine Other (See Comments)   Unknown    Fentanyl  Other (See Comments)   hypoxia   Oxycodone Hcl Other (See Comments)   Unknown    Penicillins Other (See Comments)   Unknown    Tape Dermatitis   Tomato Itching   Chlorhexidine Gluconate Rash        Medication List     PAUSE taking these medications    olmesartan  40 MG tablet Wait to take this until your doctor or other care provider tells you to start again. Commonly known as: BENICAR  Take 1 tablet (40 mg total) by mouth daily. What changed: when to take this       TAKE these medications    acetaminophen  325 MG tablet Commonly known as: TYLENOL  Take 1-2 tablets (325-650 mg total) by mouth every 4 (four) hours as needed for mild pain (pain score 1-3).   amLODipine  5 MG tablet Commonly known as: NORVASC  Take 1 tablet (5 mg total) by mouth daily. What changed: when to take this   aspirin  EC 81 MG tablet Take 1 tablet (81 mg total) by mouth daily. Swallow whole.   camphor-menthol  lotion Commonly known as: SARNA Apply topically as needed for itching.   diclofenac   Sodium 1 % Gel Commonly known as: VOLTAREN  Apply 2 g topically 4 (four) times daily. To both knees What changed:  when to take this reasons to take this additional instructions   Eliquis  5 MG Tabs tablet Generic drug: apixaban  Take 1 tablet (5 mg total) by mouth 2 (two) times daily.   ezetimibe  10 MG tablet Commonly known as: ZETIA  Take 1 tablet (10 mg total) by mouth daily.   famotidine  20 MG tablet Commonly known as: PEPCID  Take 1 tablet (20 mg total) by mouth daily.   HYDROcodone -acetaminophen  5-325 MG tablet Commonly known as: NORCO/VICODIN Take 2 tablets by mouth every 6 (six) hours as needed for moderate pain (pain score 4-6) or severe pain (pain score 7-10).   IRON PO Take 1 tablet by mouth 2 (two) times daily.   Lantus  SoloStar 100 UNIT/ML Solostar Pen Generic drug: insulin  glargine Inject 11 Units into the skin daily.   levETIRAcetam  500 MG tablet Commonly known as: Keppra  Take 1 tablet (500 mg total) by mouth 2 (two) times daily. What changed: when to take this   lidocaine  5 % Commonly known as: LIDODERM  Place 2 patches onto the skin daily. Remove & Discard patch within 12 hours or as directed by MD What changed:  when to take this reasons to take this additional instructions   magnesium  oxide 400 MG tablet Commonly known as: MAG-OX Take 1  tablet (400 mg total) by mouth 2 (two) times daily.   melatonin 5 MG Tabs Take 1 tablet (5 mg total) by mouth at bedtime as needed.   methimazole  10 MG tablet Commonly known as: TAPAZOLE  Take 1 tablet (10 mg total) by mouth daily. What changed: Another medication with the same name was removed. Continue taking this medication, and follow the directions you see here.   metoprolol  tartrate 25 MG tablet Commonly known as: LOPRESSOR  Take 1 tablet (25 mg total) by mouth 2 (two) times daily.   multivitamin with minerals tablet Take 1 tablet by mouth daily.   polyethylene glycol 17 g packet Commonly known as:  MIRALAX  / GLYCOLAX  Take 17 g by mouth daily as needed for mild constipation.   rosuvastatin  20 MG tablet Commonly known as: CRESTOR  Take 1 tablet (20 mg total) by mouth daily after supper. What changed: Another medication with the same name was removed. Continue taking this medication, and follow the directions you see here.   senna 8.6 MG Tabs tablet Commonly known as: SENOKOT Take 2 tablets (17.2 mg total) by mouth daily. What changed:  when to take this reasons to take this   sertraline  25 MG tablet Commonly known as: Zoloft  Take 1 tablet (25 mg total) by mouth daily.        Disposition and follow-up:   Megan Fischer was discharged from [facility] in Good condition.  At the hospital follow up visit please address:  1.  Chest Pain: Ensure follow up with cardiology due to high risk factors, although likely MSK. No complaints of CP and able to illicit CP with palpation to chest wall. Patient stated that lidocaine  patch gave most relief (voltaren  gel and tylenol  do not help much)   AKI: Follow up Cr trend. Responded to 500 ml fluid bolus. Paused ARB at dc, restart if Cr function improves. Encouraged oral hydration.  2.  Labs / imaging needed at time of follow-up: BMP  3.  Pending labs/ test needing follow-up: n/a  Follow-up Appointments:  Follow-up Information     Bernadine Manos, MD. Schedule an appointment as soon as possible for a visit.   Specialty: Internal Medicine Why: you should be called to make an appointment by the end of the week. Contact information: [address] [place] [address] [phone]                  Hospital Course by problem list: Megan Fischer is a 74 y.o. person living with a history of type 2 diabetes, HFmrEF, A-fib on Eliquis , hypertension, prior pulmonary embolism 03/2024, pulmonary hypertension, CKD stage III, prior CVA with left-sided deficits, hyperthyroidism 2/2 multinodular goiter, and  GERD who presented with chest pain and is admitted for further workup and monitoring with concomitant AKI now being discharged on hospital day 0 with the following pertinent hospital course:  AKI on CKD stage III Presented with Creatinine 1.69 up from 1.09 on 05/12/2024.  She does report increased urine output but no other UTI symptoms.  No signs of systemic infection.  UA with 30 protein, rare bacteria, and hyaline casts present. Received 500 mL LR bolus and holding ARB with improvement of Cr to 1.31. Hold ARB on discharge and encouraged PO fluids. Monitor f/u in clinic.    Chest pain Presented to ED with complaint of L>R anterior CP that worsens with palpation, movement and described as sharp in nature. Several significant comorbidities including prior pulmonary embolism, prior stroke,  atrial fibrillation, diabetes, and hypertension. She is adherent to anticoagulation and Wells score is 1.5. Troponin down trending without EKG changes. Treated as chest wall pain with lidocaine  patch, Tylenol , and voltaren  gel. Discussed importance of close follow-up with her cardiologist for potential stress test. On discharge, patient denied CP and CP was illicited with palpation on physical exam.    HTN Afib Permanent afib without RVR here. Holding ARB as above for AKI. Continued home amlodipine  5 mg daily, metoprolol  tartrate 25 mg BID, and eliquis  5 mg BID   T2DM No hypoglycemia or severe hyperglycemia. Continued home lantus  11 units daily and sliding scale   Hyperthyroidism No signs of overt hypo/hyperthyroidism. Continued home methimazole  10 mg daily   Prior CVA Left sided deficits appear stable on exam and rather she is globally mildly weak with onset 2 weeks ago. We will monitor for any new neuro deficits. Continued aspirin , statin, and eliquis     Stable chronic medical conditions: Hemiplegia and hemiparesis L side MDD  Subjective Patient in good spirits today. She has just a little bit of belly  pain mid-belly, but reports she last pooped yesterday and has not pooped yet today. Denies chest pain today, but when pressing on the chest she does report some tenderness. She reports she has been drinking water  well. She denies any palpitations currently. She has no complaints and denies any other issues currently. She feels safe for discharge today. She was instructed to hold off on taking Olmesartan  until following back up in the clinic, and she voiced understanding of this. She does mention some wax build up in her left ear.   Discharge Exam:   BP (!) 112/48   Pulse 71   Temp 97.6 F (36.4 C) (Oral)   Resp 13   Ht 5' 3 (1.6 m)   Wt 77.1 kg   SpO2 100%   BMI 30.11 kg/m  Discharge exam:  Physical Exam Constitutional:      General: She is not in acute distress.    Appearance: She is not ill-appearing or toxic-appearing.  HENT:     Left Ear: There is impacted cerumen.  Cardiovascular:     Rate and Rhythm: Normal rate. Rhythm irregular.     Heart sounds: No murmur heard.    No friction rub. No gallop.  Pulmonary:     Effort: Pulmonary effort is normal.  Chest:     Chest wall: Tenderness present.  Abdominal:     General: Bowel sounds are normal.     Palpations: Abdomen is soft.     Tenderness: There is no abdominal tenderness. There is no guarding.  Musculoskeletal:     Comments: L>R LE swelling (chronic, no change from baseline, no pitting)  Skin:    General: Skin is warm and dry.  Neurological:     Mental Status: She is alert.  Psychiatric:        Mood and Affect: Mood normal.        Behavior: Behavior normal.      Pertinent Labs, Studies, and Procedures:     Latest Ref Rng & Units 06/03/2024    5:00 AM 06/02/2024    6:19 AM 05/11/2024   12:21 PM  CBC  WBC 4.0 - 10.5 K/uL 4.5  4.0  7.3   Hemoglobin 12.0 - 15.0 g/dL 87.1  86.8  87.2   Hematocrit 36.0 - 46.0 % 39.6  40.2  40.8   Platelets 150 - 400 K/uL 165  192  238  Latest Ref Rng & Units 06/03/2024     5:00 AM 06/02/2024    6:19 AM 05/11/2024   10:06 PM  CMP  Glucose 70 - 99 mg/dL 862  808  894   BUN 8 - 23 mg/dL 58  73  31   Creatinine 0.44 - 1.00 mg/dL 8.68  8.30  8.90   Sodium 135 - 145 mmol/L 141  140  138   Potassium 3.5 - 5.1 mmol/L 4.7  4.7  4.4   Chloride 98 - 111 mmol/L 111  109  105   CO2 22 - 32 mmol/L 19  20  21    Calcium  8.9 - 10.3 mg/dL 9.9  9.9  9.5   Total Protein 6.5 - 8.1 g/dL   7.1   Total Bilirubin 0.0 - 1.2 mg/dL   0.6   Alkaline Phos 38 - 126 U/L   93   AST 15 - 41 U/L   38   ALT 0 - 44 U/L   27     VAS US  LOWER EXTREMITY VENOUS (DVT) (7a-5p) Result Date: 06/02/2024  Lower Venous DVT Study Patient Name:  Megan Fischer  Date of Exam:   06/02/2024 Medical Rec #: [id number]      Accession #:    [id number] Date of Birth: 22-Dec-1950     Patient Gender: F Patient Age:   85 years Exam Location:  [facility] Procedure:      VAS US  LOWER EXTREMITY VENOUS (DVT) Referring Phys: HAMP FONDAW --------------------------------------------------------------------------------  Indications: Swelling, Edema, and chest pain. Other Indications: H/O A-fib, CVA, left sided deficit. Limitations: Musculoskeletal features and patient position. Comparison Study: Previous study of the bilateral lower extremities on                   11.16.2025. Performing Technologist: Edilia Elden Appl  Examination Guidelines: A complete evaluation includes B-mode imaging, spectral Doppler, color Doppler, and power Doppler as needed of all accessible portions of each vessel. Bilateral testing is considered an integral part of a complete examination. Limited examinations for reoccurring indications may be performed as noted. The reflux portion of the exam is performed with the patient in reverse Trendelenburg.  +-----+---------------+---------+-----------+----------+--------------+ RIGHTCompressibilityPhasicitySpontaneityPropertiesThrombus Aging  +-----+---------------+---------+-----------+----------+--------------+ CFV  Full           Yes      Yes                                 +-----+---------------+---------+-----------+----------+--------------+ SFJ  Full           Yes      Yes                                 +-----+---------------+---------+-----------+----------+--------------+   +---------+---------------+---------+-----------+----------+-------------------+ LEFT     CompressibilityPhasicitySpontaneityPropertiesThrombus Aging      +---------+---------------+---------+-----------+----------+-------------------+ CFV      Full           Yes      Yes                                      +---------+---------------+---------+-----------+----------+-------------------+ SFJ      Full           Yes      Yes                                      +---------+---------------+---------+-----------+----------+-------------------+  FV Prox  Full                                                             +---------+---------------+---------+-----------+----------+-------------------+ FV Mid   Full                                                             +---------+---------------+---------+-----------+----------+-------------------+ FV DistalFull                                                             +---------+---------------+---------+-----------+----------+-------------------+ PFV      Full                                                             +---------+---------------+---------+-----------+----------+-------------------+ POP      Full           Yes      Yes                                      +---------+---------------+---------+-----------+----------+-------------------+ PTV                                                   Not well visualized +---------+---------------+---------+-----------+----------+-------------------+ PERO                                                   Not well visualized +---------+---------------+---------+-----------+----------+-------------------+     Summary: RIGHT: - No evidence of common femoral vein obstruction.   LEFT: - There is no evidence of deep vein thrombosis in the lower extremity. However, portions of this examination were limited- see technologist comments above.  - No cystic structure found in the popliteal fossa.  *See table(s) above for measurements and observations. Electronically signed by Fonda Rim on 06/02/2024 at 2:39:21 PM.    Final    DG Chest 2 View Result Date: 06/02/2024 EXAM: 2 VIEW(S) XRAY OF THE CHEST 06/02/2024 07:04:00 AM COMPARISON: 05/11/2024 CLINICAL HISTORY: 74 year old female. Persistent chest pain for 1 week radiating down the left arm. FINDINGS: LINES, TUBES AND DEVICES: Chronic cholecystectomy clips. Negative visible bowel gas. LUNGS AND PLEURA: Low lung volume. Elevated right hemidiaphragm. No focal pulmonary opacity. No pleural effusion. No pneumothorax. HEART AND MEDIASTINUM: Aortic arch calcifications. No acute abnormality of the cardiac and mediastinal silhouettes. BONES AND SOFT TISSUES: Screw in right humeral head. IMPRESSION: 1.  Low lung volumes with no acute cardiopulmonary abnormality. 2. Aortic Atherosclerosis (ICD10-I70.0). Electronically signed by: Helayne Hurst MD 06/02/2024 07:12 AM EST RP Workstation: [id number]     Discharge Instructions: Discharge Instructions     Call MD for:  difficulty breathing, headache or visual disturbances   Complete by: As directed    Call MD for:  hives   Complete by: As directed    Call MD for:  persistant dizziness or light-headedness   Complete by: As directed    Call MD for:  persistant nausea and vomiting   Complete by: As directed    Call MD for:  severe uncontrolled pain   Complete by: As directed    Call MD for:  temperature >100.4   Complete by: As directed    Discharge instructions   Complete by: As directed    Thank you for  allowing us  to be part of your care. You were hospitalized for Chest pain (workup did not show any concerns with your heart, it is likely you are having muscular pain that was treated with lidocaine  patch, Tylenol , and voltaren  gel. You additionally presented with increased kidney number which is likely due to not drinking enough water . Your Kidney number improved with IV fluids. We will keep a close on it in the clinic.   See the changes in your medications and management of your chronic conditions below:  *For your Chest Pain  -We have STARTED you on these following medications:  - Lidocaine  Patch  - Voltaren  Gel   - Tylenol  325-650 mg every 4 hours as needed       *For your Acute Kidney Injury -We have PAUSED the following medications:  - Olmesartan  40 mg daily (wait until you are seen back in the clinic) - Continue hydrating well, at least 4 cups of water  a day     *For your Blood Pressure, Atrial Fibrillation, and High cholesterol  -CONTINUE:             - Eliquis  5 mg twice daily   - Amlodipine  5 mg daily   - Zetia  10 mg daily   - Lopressor  25 mg two times daily  - Crestor  20 mg daily             - Zetia  10 mg daily   -We have PAUSED the following medications:  - Olmesartan  40 mg daily     FOLLOW UP APPOINTMENTS: We will arrange for a follow-up appointment in your primary care clinic the [facility] clinic. Please expect a call. If they do not call by the end of the week, please call to set up an appointment.  Please call your PCP or our clinic if you have any questions or concerns, we may be able to help and keep you from a long and expensive emergency room wait. Our clinic and after hours phone number is [phone]. The best time to call is Monday through Friday 9 am to 4 pm but there is always someone available 24/7 if you have an emergency. If you need medication refills please notify your pharmacy one week in advance and they will send us  a request.   We are glad you are  feeling better,  Sallyanne Primas Internal Medicine Inpatient Teaching Service at [facility]   Face-to-face encounter (required for Medicare/Medicaid patients)   Complete by: As directed    I Sallyanne Primas certify that this patient is under my care and that I, or a nurse practitioner or physician's assistant working  with me, had a face-to-face encounter that meets the physician face-to-face encounter requirements with this patient on 06/03/2024. The encounter with the patient was in whole, or in part for the following medical condition(s) which is the primary reason for home health care (List medical condition): previous stroke, concern for unsafe ambulation.   The encounter with the patient was in whole, or in part, for the following medical condition, which is the primary reason for home health care: previous stroke   I certify that, based on my findings, the following services are medically necessary home health services: Physical therapy   Reason for Medically Necessary Home Health Services:  Therapy- Home Adaptation to Facilitate Safety Therapy- Therapeutic Exercises to Increase Strength and Endurance     My clinical findings support the need for the above services: Unsafe ambulation due to balance issues   Further, I certify that my clinical findings support that this patient is homebound due to: Unsafe ambulation due to balance issues   Home Health   Complete by: As directed    Face to face home health PT/OT   To provide the following care/treatments:  PT OT     Increase activity slowly   Complete by: As directed        Signed: Shakela Donati, DO 06/03/2024, 11:34 AM     "

## 2024-06-04 ENCOUNTER — Telehealth: Payer: Self-pay

## 2024-06-04 NOTE — Transitions of Care (Post Inpatient/ED Visit) (Signed)
" ° °  06/04/2024  Name: Megan Fischer MRN: [id number] DOB: Oct 01, 1950  Today's TOC FU Call Status: Today's TOC FU Call Status:: Unsuccessful Call (1st Attempt) Unsuccessful Call (1st Attempt) Date: 06/04/24  Attempted to reach the patient regarding the most recent Inpatient/ED visit.  Follow Up Plan: Additional outreach attempts will be made to reach the patient to complete the Transitions of Care (Post Inpatient/ED visit) call.   Signature Julian Lemmings, LPN [facility] Nurse Health Advisor Direct Dial [phone]  "

## 2024-06-05 ENCOUNTER — Telehealth: Payer: Self-pay | Admitting: *Deleted

## 2024-06-05 NOTE — Telephone Encounter (Signed)
 RTC to [place] [facility] with [facility]. Verbal approval was given for PT 1 time a week for 9 weeks .  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 Will forward to PCP for agreement or denial.  Copied from CRM #[id number]. Topic: Clinical - Home Health Verbal Orders >> Jun 05, 2024 12:04 PM Chiquita SQUIBB wrote: Caller/Agency: Redell GLENWOOD [facility]ney  Callback Number: [phone] OKAY to leave a VM - Secure line Service Requested: Physical Therapy Frequency: 1 time for 9 weeks to work on strengthen and balance  Any new concerns about the patient? No

## 2024-06-05 NOTE — Transitions of Care (Post Inpatient/ED Visit) (Signed)
" ° °  06/05/2024  Name: Megan Fischer MRN: [id number] DOB: Mar 08, 1951  Today's TOC FU Call Status: Today's TOC FU Call Status:: Unsuccessful Call (2nd Attempt) Unsuccessful Call (1st Attempt) Date: 06/04/24 Unsuccessful Call (2nd Attempt) Date: 06/05/24  Attempted to reach the patient regarding the most recent Inpatient/ED visit.  Follow Up Plan: Additional outreach attempts will be made to reach the patient to complete the Transitions of Care (Post Inpatient/ED visit) call.   Signature Julian Lemmings, LPN [facility] Nurse Health Advisor Direct Dial [phone]  "

## 2024-06-06 NOTE — Transitions of Care (Post Inpatient/ED Visit) (Signed)
" ° °  06/06/2024  Name: Megan Fischer MRN: [id number] DOB: Jun 09, 1950  Today's TOC FU Call Status: Today's TOC FU Call Status:: Successful TOC FU Call Completed Unsuccessful Call (1st Attempt) Date: 06/04/24 Unsuccessful Call (2nd Attempt) Date: 06/05/24 [facility] FU Call Complete Date: 06/06/24  Patient's Name and Date of Birth confirmed. Name, DOB  Transition Care Management Follow-up Telephone Call Date of Discharge: 06/03/24 Discharge Facility: [facility]) Type of Discharge: Inpatient Admission Primary Inpatient Discharge Diagnosis:: AKF How have you been since you were released from the hospital?: Better Any questions or concerns?: No  Items Reviewed: Did you receive and understand the discharge instructions provided?: Yes Medications obtained,verified, and reconciled?: Yes (Medications Reviewed) Any new allergies since your discharge?: No Do you have support at home?: Yes People in Home [RPT]: child(ren), adult  Medications Reviewed Today: Medications Reviewed Today   Medications were not reviewed in this encounter     Home Care and Equipment/Supplies: Were Home Health Services Ordered?: Yes Name of Home Health Agency:: [facility] Has Agency set up a time to come to your home?: Yes First Home Health Visit Date: 06/05/24 Any new equipment or medical supplies ordered?: NA  Functional Questionnaire: Do you need assistance with bathing/showering or dressing?: Yes Do you need assistance with meal preparation?: Yes Do you need assistance with eating?: No Do you have difficulty maintaining continence: No Do you need assistance with getting out of bed/getting out of a chair/moving?: No Do you have difficulty managing or taking your medications?: Yes  Follow up appointments reviewed: PCP Follow-up appointment confirmed?: Yes Date of PCP follow-up appointment?: 06/10/24 Follow-up Provider: [facility] Follow-up appointment confirmed?: Yes Date of Specialist  follow-up appointment?: 07/08/24 Follow-Up Specialty Provider:: neuro Do you need transportation to your follow-up appointment?: No Do you understand care options if your condition(s) worsen?: Yes-patient verbalized understanding    SIGNATURE Julian Lemmings, LPN [facility] Nurse Health Advisor Direct Dial [phone]  "

## 2024-06-09 ENCOUNTER — Other Ambulatory Visit: Payer: Self-pay | Admitting: Student

## 2024-06-09 ENCOUNTER — Other Ambulatory Visit: Payer: Self-pay

## 2024-06-09 DIAGNOSIS — I10 Essential (primary) hypertension: Secondary | ICD-10-CM

## 2024-06-10 ENCOUNTER — Other Ambulatory Visit: Payer: Self-pay

## 2024-06-10 ENCOUNTER — Ambulatory Visit: Payer: Self-pay

## 2024-06-10 NOTE — Telephone Encounter (Signed)
 Medication discontinued 05/15/24

## 2024-06-11 ENCOUNTER — Encounter: Payer: Self-pay | Admitting: Hematology

## 2024-06-11 ENCOUNTER — Other Ambulatory Visit: Payer: Self-pay

## 2024-06-11 ENCOUNTER — Ambulatory Visit

## 2024-06-11 ENCOUNTER — Other Ambulatory Visit: Payer: Self-pay | Admitting: Student

## 2024-06-11 VITALS — BP 131/84 | HR 83 | Temp 97.8°F | Ht 63.0 in

## 2024-06-11 DIAGNOSIS — I10 Essential (primary) hypertension: Secondary | ICD-10-CM

## 2024-06-11 DIAGNOSIS — G8929 Other chronic pain: Secondary | ICD-10-CM

## 2024-06-11 DIAGNOSIS — Z0000 Encounter for general adult medical examination without abnormal findings: Secondary | ICD-10-CM

## 2024-06-11 DIAGNOSIS — F321 Major depressive disorder, single episode, moderate: Secondary | ICD-10-CM

## 2024-06-11 DIAGNOSIS — R079 Chest pain, unspecified: Secondary | ICD-10-CM

## 2024-06-11 DIAGNOSIS — N179 Acute kidney failure, unspecified: Secondary | ICD-10-CM | POA: Diagnosis not present

## 2024-06-11 LAB — BASIC METABOLIC PANEL WITH GFR
Anion gap: 18 — ABNORMAL HIGH (ref 5–15)
BUN: 63 mg/dL — ABNORMAL HIGH (ref 8–23)
CO2: 16 mmol/L — ABNORMAL LOW (ref 22–32)
Calcium: 10 mg/dL (ref 8.9–10.3)
Chloride: 107 mmol/L (ref 98–111)
Creatinine, Ser: 1.25 mg/dL — ABNORMAL HIGH (ref 0.44–1.00)
GFR, Estimated: 45 mL/min — ABNORMAL LOW
Glucose, Bld: 152 mg/dL — ABNORMAL HIGH (ref 70–99)
Potassium: 4.5 mmol/L (ref 3.5–5.1)
Sodium: 141 mmol/L (ref 135–145)

## 2024-06-11 MED ORDER — OLMESARTAN MEDOXOMIL 40 MG PO TABS
40.0000 mg | ORAL_TABLET | Freq: Every day | ORAL | 2 refills | Status: AC
  Filled 2024-06-11: qty 30, 30d supply, fill #0

## 2024-06-11 MED ORDER — IRON 325 (65 FE) MG PO TABS
325.0000 mg | ORAL_TABLET | Freq: Every day | ORAL | 2 refills | Status: AC
  Filled 2024-06-11: qty 30, 30d supply, fill #0

## 2024-06-11 MED ORDER — METOPROLOL TARTRATE 25 MG PO TABS
25.0000 mg | ORAL_TABLET | Freq: Two times a day (BID) | ORAL | 3 refills | Status: AC
  Filled 2024-06-11: qty 60, 30d supply, fill #0

## 2024-06-11 NOTE — Assessment & Plan Note (Signed)
 Diagnosed with depression at [facility] in early December and was started on sertraline  25 mg.  Son said he stopped giving his mom this medication because it worsened her condition and she returned to normal after stopping the medication.  I was unable to counsel on SSRI therapy during this encounter.  At follow-up, consider counseling about trying the medication for 4 to 6 weeks before discontinuing.  Patient was quiet with depressed mood and flat affect throughout the encounter.  I do think her depression may benefit from therapy and/or medication. Plan Sertraline  discontinued from med list Consider bringing up medication and/or behavioral health therapy at follow-up

## 2024-06-11 NOTE — Telephone Encounter (Signed)
 Medication sent to pharmacy

## 2024-06-11 NOTE — Patient Instructions (Addendum)
 It was wonderful seeing you today!   1) I will call you to discuss your lab results and if we need to make any changes in your medications.   2) I put some information about steroid injections in the after visit summary. This may be a good alternative to pain medications.   3) continue elevating your feet and wearing your compression stockings.  4) be on the lookout for someone to call about the home health nurse and community support resources    If you have any questions please feel free to the call the clinic at anytime at [phone].  Have a blessed day,  Dr. Charmayne

## 2024-06-11 NOTE — Progress Notes (Addendum)
 "  Established Patient Office Visit  Subjective   Patient ID: Megan Fischer, female    DOB: 09/24/1950  Age: 74 y.o. MRN: [id number]  HPI  Patient here for hospital follow-up.  Was discharged on 1/27 after presenting with chest pain found to be noncardiogenic with resolution of symptoms with lidocaine  patch, Tylenol  and Voltaren  gel.  She was also found to have an AKI on CKD stage IIIb which improved with fluids and holding olmesartan .  See medical history below as well as problem-based plan and assessment.  Accompanied by son.  Follow up for hospitalization for noncardiogenic chest pain and concomitant AKI on CKD stage IIIb.  The patient was last seen for this 8 days ago. Changes made at last visit include holding olmesartan  on discharge.  She reports that she has been taking olmesartan .  Her son was not there on discharge and was not aware of instructions to hold olmesartan . She feels that condition is Improved.  She has no pain today other than her right shoulder pain which she has had for several years. She is not having side effects.   -----------------------------------------------------------------------------------------   Patient Active Problem List   Diagnosis Date Noted   Nonspecific chest pain 06/02/2024   Urinary frequency 06/02/2024   Type 2 diabetes mellitus with other specified complication (HCC) 06/02/2024   Current moderate episode of major depressive disorder without prior episode (HCC) 05/20/2024   Aphasia as late effect of stroke 04/04/2024   Hemi-inattention--left 04/04/2024   Cognitive deficits following cerebral infarction 04/04/2024   H/O ischemic right PCA stroke 03/22/2024   Hypercoagulable state 03/21/2024   AKI (acute kidney injury) 03/21/2024   Malnutrition of moderate degree 03/21/2024   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   Right ventricular dysfunction 03/20/2024   Subclinical hyperthyroidism 03/19/2024   Hemiplegia and hemiparesis  following cerebral infarction affecting left non-dominant side (HCC) 03/18/2024   CKD (chronic kidney disease) stage 3, GFR 30-59 ml/min (HCC) 01/31/2024   Hypercholesterolemia 09/17/2018   Iron  deficiency anemia 06/24/2017   Anemia of chronic disease 07/20/2016   Multinodular goiter 01/20/2013   Obesities, morbid (HCC)    Hypertension    DM (diabetes mellitus) type II controlled with renal manifestation (HCC)    Atrial fibrillation (HCC) 08/04/2010   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   Acute on chronic diastolic CHF (congestive heart failure) (HCC) 04/11/2021   Acute respiratory failure with hypoxia (HCC) 04/11/2021   AKI (acute kidney injury) 03/21/2024   Atrial fibrillation (HCC)    Chronic disease anemia 03/29/2011   Diabetes mellitus    Dyslipidemia    Euglycemic DKA (diabetic ketoacidosis) (HCC) 03/19/2024   Hypertension    Obesities, morbid (HCC)    Osteoarthritis    Pulmonary embolism (HCC) 03/21/2024   Stroke (HCC) 01/21/2024   Past Surgical History:  Procedure Laterality Date   CHOLECYSTECTOMY     REPLACEMENT TOTAL KNEE BILATERAL     SHOULDER SURGERY     RIGHT SHOULDER   TUBAL LIGATION          Objective:     BP 131/84 (BP Location: Left Arm, Patient Position: Sitting, Cuff Size: Normal)   Pulse 83   Temp 97.8 F (36.6 C) (Oral)   Ht 5' 3 (1.6 m)   SpO2 95%   BMI 30.11 kg/m  BP Readings from Last 3 Encounters:  06/11/24 131/84  06/03/24 (!) 117/51  05/20/24 (!) 144/83   Wt Readings from Last 3  Encounters:  06/02/24 170 lb (77.1 kg)  05/15/24 170 lb (77.1 kg)  05/11/24 174 lb 2.6 oz (79 kg)      Physical Exam Vitals reviewed.  Constitutional:      Appearance: Normal appearance.  HENT:     Nose: Nose normal.  Eyes:     Conjunctiva/sclera: Conjunctivae normal.  Pulmonary:     Effort: Pulmonary effort is normal.  Musculoskeletal:     Right lower leg: Edema present.     Left lower  leg: Edema present.     Comments: Limited range of motion in right shoulder, chronic with worsening since stroke in November Bilateral lower extremity pitting edema, left greater than right  Skin:    General: Skin is warm.  Neurological:     Mental Status: She is alert and oriented to person, place, and time. Mental status is at baseline.      Results for orders placed or performed in visit on 06/11/24  Basic metabolic panel with GFR  Result Value Ref Range   Sodium 141 135 - 145 mmol/L   Potassium 4.5 3.5 - 5.1 mmol/L   Chloride 107 98 - 111 mmol/L   CO2 16 (L) 22 - 32 mmol/L   Glucose, Bld 152 (H) 70 - 99 mg/dL   BUN 63 (H) 8 - 23 mg/dL   Creatinine, Ser 8.74 (H) 0.44 - 1.00 mg/dL   Calcium  10.0 8.9 - 10.3 mg/dL   GFR, Estimated 45 (L) >60 mL/min   Anion gap 18 (H) 5 - 15    Last CBC Lab Results  Component Value Date   WBC 4.5 06/03/2024   HGB 12.8 06/03/2024   HCT 39.6 06/03/2024   MCV 91.7 06/03/2024   MCH 29.6 06/03/2024   RDW 15.8 (H) 06/03/2024   PLT 165 06/03/2024   Last metabolic panel Lab Results  Component Value Date   GLUCOSE 152 (H) 06/11/2024   NA 141 06/11/2024   K 4.5 06/11/2024   CL 107 06/11/2024   CO2 16 (L) 06/11/2024   BUN 63 (H) 06/11/2024   CREATININE 1.25 (H) 06/11/2024   GFRNONAA 45 (L) 06/11/2024   CALCIUM  10.0 06/11/2024   PHOS 3.0 03/24/2024   PROT 7.1 05/11/2024   ALBUMIN 3.8 05/11/2024   BILITOT 0.6 05/11/2024   ALKPHOS 93 05/11/2024   AST 38 05/11/2024   ALT 27 05/11/2024   ANIONGAP 18 (H) 06/11/2024   Last hemoglobin A1c Lab Results  Component Value Date   HGBA1C 7.7 (H) 03/23/2024      The ASCVD Risk score (Arnett DK, et al., 2019) failed to calculate for the following reasons:   Risk score cannot be calculated because patient has a medical history suggesting prior/existing ASCVD   * - Cholesterol units were assumed    Assessment & Plan:   Assessment & Plan Acute kidney injury Patient presented to the emergency  department with creatinine 1.69, consistent with AKI on CKD stage IIIb.  Had improvement with fluids and holding olmesartan , creatinine 1.31 on discharge.  Recommended on discharge to hold olmesartan  until PCP follow-up with repeat BMP.  She has not been holding this medication at home.  BMP today consistent with creatinine of 1.25.  Potassium 4.5, GFR 45 and stable.  Of note, blood draw was complicated and the blood file had to be opened which can decrease CO2, her CO2 on today's BMP was 16 and likely explained by this.  On exam she has mild bilateral pitting edema although her son says that  her swelling has been improving over the last couple of months.  She wears compression stockings, limit salt intake, and elevates her feet when at home.  Recommend patient continue on olmesartan  at this time. Plan Continue olmesartan  40 mg daily Follow-up in 1 month for repeat BMP Orders:   Microalbumin / Creatinine Urine Ratio   Basic metabolic panel with GFR  Chronic right shoulder pain Patient says that she has had this right shoulder pain since she was young.  Her son said that it worsened after several falls a few months ago around the time she had back-to-back strokes.  They have tried Tylenol , Voltaren  gel, lidocaine  patches, heating pad.  Physical therapy referral was placed at [facility] on 1/13 for her chronic right shoulder pain, son confirms that home health PT has been coming to their house.  She presented to the ED in the beginning of January after a seizure and was discharged with hydrocodone -acetaminophen  5-325 mg every 6 hours as needed.  At today's encounter, son requested more of this pain medicine because it has been really helping his mom when the pain is severe.  I discussed the side effects of opioid pain medication and risks versus benefits.  I recommended that we continue with home health PT and consider steroid injection instead of opioid pain medication.  I printed out information about steroid  injections for the AVS.  I asked patient and son to follow-up in 1 month or call the office sooner if they would like to discuss this more.  If pain is persistent and worsening, can consider imaging at follow-up and/or referral to orthopedics.  Last line option would be to do opioid pain contract for chronic use. Plan Continue Tylenol , Voltaren  gel, lidocaine  patches, heating pad Continue home health PT Reassess steroid injection at follow-up    Chest pain, unspecified type History of heart failure with mildly reduced ejection fraction, A-fib on Eliquis , hypertension and hypothyroidism.  Last saw cardiology in early January.  Presented to the ED with sharp chest pain and elevated troponins although downtrending without changes on EKG.  Improved with lidocaine  patch, Tylenol , and Voltaren  gel.  Today she denies chest pain.  Recommended to follow-up with cardiology on hospital discharge.  Currently has an appointment for the beginning of April, son is working to have this moved up. Plan No changes in medication today Encouraged cardiology follow-up soon    Healthcare maintenance Patient lives at home with her son who appears to be her primary caregiver.  She is bound to her wheelchair and her son helps her move from bed to wheelchair and wheelchair to bedside commode.  Given patient's complex medication regimen and comorbidities, I asked if it would be helpful if a home health nurse came by to assist in medication adherence and help monitor blood pressure.  Patient and son were agreeable.  Son says that he is available the vast majority of the day to help his mom although he was open to having someone call to speak to him about community resources that may be able to offer his mom some help at home if he is ever unable to. Plan [facility] and HH referral Denies pneumonia vaccine Orders:   AMB Referral [facility] Care Management   Ambulatory referral to Home Health  Current moderate episode of major  depressive disorder without prior episode (HCC) Diagnosed with depression at [facility] in early December and was started on sertraline  25 mg.  Son said he stopped giving his mom this medication because it  worsened her condition and she returned to normal after stopping the medication.  I was unable to counsel on SSRI therapy during this encounter.  At follow-up, consider counseling about trying the medication for 4 to 6 weeks before discontinuing.  Patient was quiet with depressed mood and flat affect throughout the encounter.  I do think her depression may benefit from therapy and/or medication. Plan Sertraline  discontinued from med list Consider bringing up medication and/or behavioral health therapy at follow-up     Return in about 4 weeks (around 07/09/2024).    Viktoria King, DO "

## 2024-06-12 ENCOUNTER — Ambulatory Visit: Payer: Self-pay

## 2024-06-12 ENCOUNTER — Telehealth: Payer: Self-pay | Admitting: *Deleted

## 2024-06-12 LAB — MICROALBUMIN / CREATININE URINE RATIO
Creatinine, Urine: 100.3 mg/dL
Microalb/Creat Ratio: 46 mg/g{creat} — ABNORMAL HIGH (ref 0–29)
Microalbumin, Urine: 45.8 ug/mL

## 2024-06-12 NOTE — Progress Notes (Unsigned)
 Complex Care Management Note Care Guide Note  06/12/2024 Name: LUCYNDA ROSANO MRN: [id number] DOB: 09/07/1950   Complex Care Management Outreach Attempts: An unsuccessful telephone outreach was attempted today to offer the patient information about available complex care management services.  Follow Up Plan:  Additional outreach attempts will be made to offer the patient complex care management information and services.   Encounter Outcome:  No Answer  Harlene Satterfield  [facility]  [facility], [facility] Guide  Direct Dial: [phone]  Fax [phone]

## 2024-06-13 NOTE — Progress Notes (Signed)
 Complex Care Management Note  Care Guide Note 06/13/2024 Name: Megan Fischer MRN: [id number] DOB: April 17, 1951  Megan Fischer is a 74 y.o. year old female who sees Bernadine Manos, MD for primary care. I reached out to [name] by phone today to offer complex care management services.  Megan Fischer was given information about Complex Care Management services today including:   The Complex Care Management services include support from the care team which includes your Nurse Care Manager, Clinical Social Worker, or Pharmacist.  The Complex Care Management team is here to help remove barriers to the health concerns and goals most important to you. Complex Care Management services are voluntary, and the patient may decline or stop services at any time by request to their care team member.   Complex Care Management Consent Status: Patient agreed to services and verbal consent obtained. Patient son Megan Fischer POA   Follow up plan:  Telephone appointment with complex care management team member scheduled for:  06/17/24 with RNCM and 06/20/24 with BSW   Encounter Outcome:  Patient Scheduled  Harlene Satterfield  [facility]  [facility], [facility] Guide  Direct Dial: [phone]  Fax [phone]

## 2024-06-17 ENCOUNTER — Telehealth

## 2024-06-19 ENCOUNTER — Ambulatory Visit: Payer: Self-pay | Admitting: Student

## 2024-06-20 ENCOUNTER — Telehealth

## 2024-07-08 ENCOUNTER — Ambulatory Visit: Admitting: Neurology

## 2024-08-07 ENCOUNTER — Other Ambulatory Visit

## 2024-08-07 ENCOUNTER — Ambulatory Visit: Admitting: Nurse Practitioner

## 2024-08-14 ENCOUNTER — Ambulatory Visit: Admitting: Cardiology
